# Patient Record
Sex: Male | Born: 1935
Health system: Southern US, Community
[De-identification: ages and names within clinical notes are randomized; demographics above are authoritative.]

## PROBLEM LIST (undated history)

## (undated) DIAGNOSIS — E739 Lactose intolerance, unspecified: Secondary | ICD-10-CM

## (undated) DIAGNOSIS — I359 Nonrheumatic aortic valve disorder, unspecified: Secondary | ICD-10-CM

## (undated) DIAGNOSIS — E785 Hyperlipidemia, unspecified: Secondary | ICD-10-CM

## (undated) DIAGNOSIS — Z9981 Dependence on supplemental oxygen: Secondary | ICD-10-CM

## (undated) DIAGNOSIS — G4730 Sleep apnea, unspecified: Secondary | ICD-10-CM

## (undated) DIAGNOSIS — I495 Sick sinus syndrome: Secondary | ICD-10-CM

## (undated) DIAGNOSIS — K56609 Unspecified intestinal obstruction, unspecified as to partial versus complete obstruction: Secondary | ICD-10-CM

## (undated) DIAGNOSIS — Z9289 Personal history of other medical treatment: Secondary | ICD-10-CM

## (undated) DIAGNOSIS — E119 Type 2 diabetes mellitus without complications: Secondary | ICD-10-CM

## (undated) DIAGNOSIS — K5790 Diverticulosis of intestine, part unspecified, without perforation or abscess without bleeding: Secondary | ICD-10-CM

## (undated) DIAGNOSIS — I739 Peripheral vascular disease, unspecified: Secondary | ICD-10-CM

## (undated) DIAGNOSIS — K219 Gastro-esophageal reflux disease without esophagitis: Secondary | ICD-10-CM

## (undated) DIAGNOSIS — I10 Essential (primary) hypertension: Secondary | ICD-10-CM

## (undated) DIAGNOSIS — H269 Unspecified cataract: Secondary | ICD-10-CM

## (undated) DIAGNOSIS — K56600 Partial intestinal obstruction, unspecified as to cause: Secondary | ICD-10-CM

## (undated) DIAGNOSIS — K589 Irritable bowel syndrome without diarrhea: Secondary | ICD-10-CM

## (undated) DIAGNOSIS — I2510 Atherosclerotic heart disease of native coronary artery without angina pectoris: Secondary | ICD-10-CM

## (undated) DIAGNOSIS — R0989 Other specified symptoms and signs involving the circulatory and respiratory systems: Secondary | ICD-10-CM

## (undated) DIAGNOSIS — C61 Malignant neoplasm of prostate: Secondary | ICD-10-CM

## (undated) DIAGNOSIS — K5730 Diverticulosis of large intestine without perforation or abscess without bleeding: Secondary | ICD-10-CM

## (undated) DIAGNOSIS — E669 Obesity, unspecified: Secondary | ICD-10-CM

## (undated) DIAGNOSIS — J449 Chronic obstructive pulmonary disease, unspecified: Secondary | ICD-10-CM

## (undated) DIAGNOSIS — M1990 Unspecified osteoarthritis, unspecified site: Secondary | ICD-10-CM

## (undated) DIAGNOSIS — I2589 Other forms of chronic ischemic heart disease: Secondary | ICD-10-CM

## (undated) DIAGNOSIS — E210 Primary hyperparathyroidism: Secondary | ICD-10-CM

## (undated) DIAGNOSIS — J4489 Other specified chronic obstructive pulmonary disease: Secondary | ICD-10-CM

## (undated) DIAGNOSIS — K5720 Diverticulitis of large intestine with perforation and abscess without bleeding: Secondary | ICD-10-CM

## (undated) DIAGNOSIS — E871 Hypo-osmolality and hyponatremia: Secondary | ICD-10-CM

## (undated) DIAGNOSIS — K449 Diaphragmatic hernia without obstruction or gangrene: Secondary | ICD-10-CM

## (undated) DIAGNOSIS — C9210 Chronic myeloid leukemia, BCR/ABL-positive, not having achieved remission: Principal | ICD-10-CM

## (undated) DIAGNOSIS — D649 Anemia, unspecified: Secondary | ICD-10-CM

## (undated) DIAGNOSIS — J309 Allergic rhinitis, unspecified: Secondary | ICD-10-CM

## (undated) DIAGNOSIS — I5189 Other ill-defined heart diseases: Secondary | ICD-10-CM

## (undated) HISTORY — DX: Diverticulosis of intestine, part unspecified, without perforation or abscess without bleeding: K57.90

## (undated) HISTORY — DX: Chronic obstructive pulmonary disease, unspecified: J44.9

## (undated) HISTORY — PX: PENILE PROSTHESIS PLACEMENT: SHX739

## (undated) HISTORY — DX: Nonrheumatic aortic valve disorder, unspecified: I35.9

## (undated) HISTORY — DX: Peripheral vascular disease, unspecified: I73.9

## (undated) HISTORY — DX: Partial intestinal obstruction, unspecified as to cause: K56.600

## (undated) HISTORY — PX: BACK SURGERY: SHX140

## (undated) HISTORY — DX: Personal history of other medical treatment: Z92.89

## (undated) HISTORY — DX: Other specified symptoms and signs involving the circulatory and respiratory systems: R09.89

## (undated) HISTORY — DX: Type 2 diabetes mellitus without complications: E11.9

## (undated) HISTORY — DX: Lactose intolerance, unspecified: E73.9

## (undated) HISTORY — DX: Irritable bowel syndrome, unspecified: K58.9

## (undated) HISTORY — DX: Anemia, unspecified: D64.9

## (undated) HISTORY — DX: Diaphragmatic hernia without obstruction or gangrene: K44.9

## (undated) HISTORY — PX: CORONARY ARTERY BYPASS GRAFT: SHX141

## (undated) HISTORY — DX: Unspecified osteoarthritis, unspecified site: M19.90

## (undated) HISTORY — DX: Chronic myeloid leukemia, BCR/ABL-positive, not having achieved remission: C92.10

## (undated) HISTORY — DX: Essential (primary) hypertension: I10

## (undated) HISTORY — PX: PACEMAKER INSERTION: SHX728

## (undated) HISTORY — DX: Hyperlipidemia, unspecified: E78.5

## (undated) HISTORY — DX: Obesity, unspecified: E66.9

## (undated) HISTORY — DX: Gastro-esophageal reflux disease without esophagitis: K21.9

## (undated) HISTORY — DX: Unspecified intestinal obstruction, unspecified as to partial versus complete obstruction: K56.609

## (undated) HISTORY — PX: COLON SURGERY: SHX602

## (undated) HISTORY — DX: Primary hyperparathyroidism: E21.0

## (undated) HISTORY — PX: OTHER SURGICAL HISTORY: SHX169

## (undated) HISTORY — DX: Malignant neoplasm of prostate: C61

## (undated) HISTORY — PX: COLONOSCOPY: SHX174

## (undated) HISTORY — DX: Other forms of chronic ischemic heart disease: I25.89

## (undated) HISTORY — DX: Diverticulosis of large intestine without perforation or abscess without bleeding: K57.30

## (undated) HISTORY — PX: INGUINAL HERNIA REPAIR: SUR1180

## (undated) HISTORY — DX: Unspecified cataract: H26.9

## (undated) HISTORY — DX: Allergic rhinitis, unspecified: J30.9

## (undated) HISTORY — PX: PTCA: SHX146

## (undated) HISTORY — DX: Atherosclerotic heart disease of native coronary artery without angina pectoris: I25.10

## (undated) HISTORY — DX: Other specified chronic obstructive pulmonary disease: J44.89

## (undated) HISTORY — PX: ESOPHAGOGASTRODUODENOSCOPY: SHX1529

## (undated) HISTORY — DX: Sick sinus syndrome: I49.5

---

## 1998-07-24 ENCOUNTER — Encounter: Admission: RE | Admit: 1998-07-24 | Discharge: 1998-09-10 | Payer: Self-pay | Admitting: Anesthesiology

## 1998-08-19 ENCOUNTER — Ambulatory Visit (HOSPITAL_COMMUNITY): Admission: RE | Admit: 1998-08-19 | Discharge: 1998-08-19 | Payer: Self-pay | Admitting: Orthopedic Surgery

## 1998-08-19 ENCOUNTER — Encounter: Payer: Self-pay | Admitting: Orthopedic Surgery

## 1999-12-09 ENCOUNTER — Encounter: Admission: RE | Admit: 1999-12-09 | Discharge: 1999-12-09 | Payer: Self-pay | Admitting: Family Medicine

## 1999-12-09 ENCOUNTER — Encounter: Payer: Self-pay | Admitting: Family Medicine

## 2000-06-15 ENCOUNTER — Inpatient Hospital Stay (HOSPITAL_COMMUNITY): Admission: EM | Admit: 2000-06-15 | Discharge: 2000-06-18 | Payer: Self-pay | Admitting: Cardiology

## 2000-06-15 ENCOUNTER — Encounter: Payer: Self-pay | Admitting: Cardiology

## 2000-06-18 ENCOUNTER — Encounter: Payer: Self-pay | Admitting: Cardiology

## 2000-10-27 ENCOUNTER — Encounter: Admission: RE | Admit: 2000-10-27 | Discharge: 2000-10-27 | Payer: Self-pay | Admitting: Family Medicine

## 2000-10-27 ENCOUNTER — Encounter: Payer: Self-pay | Admitting: Family Medicine

## 2001-07-01 ENCOUNTER — Ambulatory Visit: Admission: RE | Admit: 2001-07-01 | Discharge: 2001-09-29 | Payer: Self-pay | Admitting: Radiation Oncology

## 2001-08-22 ENCOUNTER — Encounter: Payer: Self-pay | Admitting: Urology

## 2001-08-22 ENCOUNTER — Encounter: Admission: RE | Admit: 2001-08-22 | Discharge: 2001-08-22 | Payer: Self-pay | Admitting: Urology

## 2001-09-27 ENCOUNTER — Ambulatory Visit (HOSPITAL_BASED_OUTPATIENT_CLINIC_OR_DEPARTMENT_OTHER): Admission: RE | Admit: 2001-09-27 | Discharge: 2001-09-27 | Payer: Self-pay | Admitting: Urology

## 2001-09-27 ENCOUNTER — Encounter: Payer: Self-pay | Admitting: Urology

## 2001-09-30 ENCOUNTER — Ambulatory Visit: Admission: RE | Admit: 2001-09-30 | Discharge: 2001-12-29 | Payer: Self-pay | Admitting: Radiation Oncology

## 2001-11-24 ENCOUNTER — Encounter: Admission: RE | Admit: 2001-11-24 | Discharge: 2001-11-24 | Payer: Self-pay | Admitting: Family Medicine

## 2001-11-24 ENCOUNTER — Encounter: Payer: Self-pay | Admitting: Family Medicine

## 2001-12-05 ENCOUNTER — Encounter: Payer: Self-pay | Admitting: Family Medicine

## 2001-12-05 ENCOUNTER — Encounter: Admission: RE | Admit: 2001-12-05 | Discharge: 2001-12-05 | Payer: Self-pay | Admitting: Family Medicine

## 2002-02-09 ENCOUNTER — Observation Stay (HOSPITAL_COMMUNITY): Admission: RE | Admit: 2002-02-09 | Discharge: 2002-02-10 | Payer: Self-pay | Admitting: Urology

## 2002-05-23 ENCOUNTER — Encounter: Payer: Self-pay | Admitting: Family Medicine

## 2002-05-23 ENCOUNTER — Encounter: Admission: RE | Admit: 2002-05-23 | Discharge: 2002-05-23 | Payer: Self-pay | Admitting: Family Medicine

## 2003-01-12 ENCOUNTER — Encounter: Payer: Self-pay | Admitting: Emergency Medicine

## 2003-01-12 ENCOUNTER — Inpatient Hospital Stay (HOSPITAL_COMMUNITY): Admission: EM | Admit: 2003-01-12 | Discharge: 2003-01-17 | Payer: Self-pay | Admitting: Emergency Medicine

## 2003-04-16 ENCOUNTER — Inpatient Hospital Stay (HOSPITAL_COMMUNITY): Admission: EM | Admit: 2003-04-16 | Discharge: 2003-04-17 | Payer: Self-pay | Admitting: Emergency Medicine

## 2003-05-29 ENCOUNTER — Ambulatory Visit (HOSPITAL_COMMUNITY): Admission: RE | Admit: 2003-05-29 | Discharge: 2003-05-29 | Payer: Self-pay | Admitting: Gastroenterology

## 2003-07-17 ENCOUNTER — Encounter (INDEPENDENT_AMBULATORY_CARE_PROVIDER_SITE_OTHER): Payer: Self-pay | Admitting: Gastroenterology

## 2003-11-12 ENCOUNTER — Encounter: Admission: RE | Admit: 2003-11-12 | Discharge: 2003-11-12 | Payer: Self-pay | Admitting: Family Medicine

## 2004-09-22 ENCOUNTER — Ambulatory Visit: Payer: Self-pay | Admitting: Gastroenterology

## 2004-10-02 ENCOUNTER — Ambulatory Visit: Payer: Self-pay | Admitting: Gastroenterology

## 2004-10-21 ENCOUNTER — Ambulatory Visit: Payer: Self-pay | Admitting: Gastroenterology

## 2004-11-25 ENCOUNTER — Ambulatory Visit: Payer: Self-pay | Admitting: Cardiology

## 2004-11-25 ENCOUNTER — Ambulatory Visit: Payer: Self-pay | Admitting: Internal Medicine

## 2004-12-01 ENCOUNTER — Ambulatory Visit: Payer: Self-pay | Admitting: Internal Medicine

## 2004-12-10 ENCOUNTER — Ambulatory Visit: Payer: Self-pay | Admitting: Internal Medicine

## 2004-12-17 ENCOUNTER — Ambulatory Visit: Payer: Self-pay | Admitting: Internal Medicine

## 2004-12-25 ENCOUNTER — Ambulatory Visit: Payer: Self-pay | Admitting: Internal Medicine

## 2004-12-29 ENCOUNTER — Ambulatory Visit: Payer: Self-pay | Admitting: Internal Medicine

## 2005-01-13 ENCOUNTER — Ambulatory Visit: Payer: Self-pay | Admitting: Internal Medicine

## 2005-01-19 ENCOUNTER — Ambulatory Visit: Payer: Self-pay | Admitting: Internal Medicine

## 2005-02-05 ENCOUNTER — Ambulatory Visit: Payer: Self-pay | Admitting: Internal Medicine

## 2005-02-12 ENCOUNTER — Ambulatory Visit: Payer: Self-pay | Admitting: Internal Medicine

## 2005-02-16 ENCOUNTER — Observation Stay (HOSPITAL_COMMUNITY): Admission: EM | Admit: 2005-02-16 | Discharge: 2005-02-17 | Payer: Self-pay | Admitting: Emergency Medicine

## 2005-02-18 ENCOUNTER — Ambulatory Visit: Payer: Self-pay | Admitting: Internal Medicine

## 2005-03-05 ENCOUNTER — Encounter: Admission: RE | Admit: 2005-03-05 | Discharge: 2005-03-05 | Payer: Self-pay | Admitting: Interventional Cardiology

## 2005-03-13 ENCOUNTER — Ambulatory Visit (HOSPITAL_COMMUNITY): Admission: RE | Admit: 2005-03-13 | Discharge: 2005-03-14 | Payer: Self-pay | Admitting: Interventional Cardiology

## 2005-03-18 ENCOUNTER — Ambulatory Visit: Payer: Self-pay | Admitting: Gastroenterology

## 2005-03-19 ENCOUNTER — Ambulatory Visit: Payer: Self-pay | Admitting: Cardiology

## 2005-04-09 ENCOUNTER — Ambulatory Visit: Payer: Self-pay | Admitting: Internal Medicine

## 2005-04-17 ENCOUNTER — Ambulatory Visit: Payer: Self-pay | Admitting: Internal Medicine

## 2005-06-11 ENCOUNTER — Ambulatory Visit: Payer: Self-pay | Admitting: Internal Medicine

## 2005-09-16 ENCOUNTER — Ambulatory Visit: Payer: Self-pay | Admitting: Gastroenterology

## 2005-09-16 ENCOUNTER — Ambulatory Visit: Payer: Self-pay | Admitting: Internal Medicine

## 2006-01-08 ENCOUNTER — Emergency Department (HOSPITAL_COMMUNITY): Admission: EM | Admit: 2006-01-08 | Discharge: 2006-01-08 | Payer: Self-pay | Admitting: Emergency Medicine

## 2006-07-21 ENCOUNTER — Ambulatory Visit: Payer: Self-pay | Admitting: Internal Medicine

## 2006-07-21 LAB — CONVERTED CEMR LAB
Albumin: 3.8 g/dL (ref 3.5–5.2)
Basophils Absolute: 0 10*3/uL (ref 0.0–0.1)
Chloride: 106 meq/L (ref 96–112)
Eosinophils Absolute: 0.2 10*3/uL (ref 0.0–0.6)
GFR calc Af Amer: 93 mL/min
GFR calc non Af Amer: 77 mL/min
HCT: 37.8 % — ABNORMAL LOW (ref 39.0–52.0)
MCHC: 35.4 g/dL (ref 30.0–36.0)
MCV: 97.6 fL (ref 78.0–100.0)
Monocytes Absolute: 0.7 10*3/uL (ref 0.2–0.7)
Neutro Abs: 3.3 10*3/uL (ref 1.4–7.7)
Neutrophils Relative %: 71.2 % (ref 43.0–77.0)
Potassium: 4.1 meq/L (ref 3.5–5.1)
RBC: 3.88 M/uL — ABNORMAL LOW (ref 4.22–5.81)
Sed Rate: 11 mm/hr (ref 0–20)
Sodium: 142 meq/L (ref 135–145)

## 2006-07-22 ENCOUNTER — Ambulatory Visit: Payer: Self-pay | Admitting: Internal Medicine

## 2006-08-02 ENCOUNTER — Ambulatory Visit: Payer: Self-pay | Admitting: Internal Medicine

## 2006-08-02 LAB — CONVERTED CEMR LAB
CO2: 30 meq/L (ref 19–32)
Chloride: 108 meq/L (ref 96–112)
Creatinine, Ser: 0.8 mg/dL (ref 0.4–1.5)
Creatinine,U: 73.1 mg/dL
Hgb A1c MFr Bld: 6.7 % — ABNORMAL HIGH (ref 4.6–6.0)
Microalb, Ur: 0.6 mg/dL (ref 0.0–1.9)
Potassium: 4.8 meq/L (ref 3.5–5.1)
Sodium: 143 meq/L (ref 135–145)

## 2006-08-04 ENCOUNTER — Ambulatory Visit: Payer: Self-pay | Admitting: Internal Medicine

## 2006-08-04 LAB — CONVERTED CEMR LAB
AST: 42 units/L — ABNORMAL HIGH (ref 0–37)
Albumin: 3.5 g/dL (ref 3.5–5.2)
Alkaline Phosphatase: 46 units/L (ref 39–117)

## 2006-08-31 ENCOUNTER — Ambulatory Visit: Payer: Self-pay | Admitting: Internal Medicine

## 2006-09-09 ENCOUNTER — Ambulatory Visit: Payer: Self-pay | Admitting: Internal Medicine

## 2006-09-09 LAB — CONVERTED CEMR LAB
AST: 29 units/L (ref 0–37)
Albumin: 3.8 g/dL (ref 3.5–5.2)
Alkaline Phosphatase: 50 units/L (ref 39–117)
Total Bilirubin: 0.7 mg/dL (ref 0.3–1.2)
Total Protein: 6.4 g/dL (ref 6.0–8.3)

## 2006-09-10 ENCOUNTER — Ambulatory Visit: Payer: Self-pay | Admitting: Internal Medicine

## 2006-09-10 LAB — CONVERTED CEMR LAB
Bilirubin Urine: NEGATIVE
Crystals: NEGATIVE
Hemoglobin, Urine: NEGATIVE
Leukocytes, UA: NEGATIVE
Nitrite: NEGATIVE
PSA: 0.02 ng/mL — ABNORMAL LOW (ref 0.10–4.00)
Total Protein, Urine: 30 mg/dL — AB
Urine Glucose: NEGATIVE mg/dL
Urobilinogen, UA: 0.2 (ref 0.0–1.0)

## 2006-09-11 ENCOUNTER — Inpatient Hospital Stay (HOSPITAL_COMMUNITY): Admission: EM | Admit: 2006-09-11 | Discharge: 2006-09-15 | Payer: Self-pay | Admitting: Emergency Medicine

## 2006-09-13 ENCOUNTER — Ambulatory Visit: Payer: Self-pay | Admitting: Internal Medicine

## 2006-09-16 ENCOUNTER — Ambulatory Visit: Payer: Self-pay | Admitting: Internal Medicine

## 2006-09-18 ENCOUNTER — Emergency Department (HOSPITAL_COMMUNITY): Admission: EM | Admit: 2006-09-18 | Discharge: 2006-09-18 | Payer: Self-pay | Admitting: Emergency Medicine

## 2006-09-21 ENCOUNTER — Ambulatory Visit: Payer: Self-pay | Admitting: Internal Medicine

## 2006-10-05 ENCOUNTER — Ambulatory Visit (HOSPITAL_COMMUNITY): Admission: RE | Admit: 2006-10-05 | Discharge: 2006-10-05 | Payer: Self-pay | Admitting: Internal Medicine

## 2006-10-11 ENCOUNTER — Ambulatory Visit (HOSPITAL_COMMUNITY): Admission: RE | Admit: 2006-10-11 | Discharge: 2006-10-11 | Payer: Self-pay | Admitting: Internal Medicine

## 2006-10-11 ENCOUNTER — Ambulatory Visit: Payer: Self-pay | Admitting: Internal Medicine

## 2006-11-04 ENCOUNTER — Ambulatory Visit: Payer: Self-pay | Admitting: Internal Medicine

## 2006-12-21 ENCOUNTER — Ambulatory Visit: Payer: Self-pay | Admitting: Internal Medicine

## 2006-12-21 LAB — CONVERTED CEMR LAB
CO2: 27 meq/L (ref 19–32)
Chloride: 107 meq/L (ref 96–112)
Creatinine, Ser: 1.2 mg/dL (ref 0.4–1.5)
Creatinine,U: 259 mg/dL
Glucose, Bld: 139 mg/dL — ABNORMAL HIGH (ref 70–99)
Hgb A1c MFr Bld: 6.5 % — ABNORMAL HIGH (ref 4.6–6.0)
Sodium: 142 meq/L (ref 135–145)

## 2006-12-27 DIAGNOSIS — J449 Chronic obstructive pulmonary disease, unspecified: Secondary | ICD-10-CM

## 2006-12-27 DIAGNOSIS — E669 Obesity, unspecified: Secondary | ICD-10-CM

## 2006-12-27 DIAGNOSIS — E739 Lactose intolerance, unspecified: Secondary | ICD-10-CM | POA: Insufficient documentation

## 2006-12-27 DIAGNOSIS — I739 Peripheral vascular disease, unspecified: Secondary | ICD-10-CM

## 2007-03-02 ENCOUNTER — Emergency Department (HOSPITAL_COMMUNITY): Admission: EM | Admit: 2007-03-02 | Discharge: 2007-03-02 | Payer: Self-pay | Admitting: Emergency Medicine

## 2007-03-04 ENCOUNTER — Ambulatory Visit: Payer: Self-pay | Admitting: Internal Medicine

## 2007-03-22 ENCOUNTER — Ambulatory Visit: Payer: Self-pay | Admitting: Internal Medicine

## 2007-03-24 ENCOUNTER — Ambulatory Visit: Payer: Self-pay | Admitting: Internal Medicine

## 2007-05-11 ENCOUNTER — Encounter: Payer: Self-pay | Admitting: Internal Medicine

## 2007-06-09 HISTORY — PX: OTHER SURGICAL HISTORY: SHX169

## 2007-06-28 ENCOUNTER — Ambulatory Visit: Payer: Self-pay | Admitting: Internal Medicine

## 2007-07-04 ENCOUNTER — Inpatient Hospital Stay (HOSPITAL_COMMUNITY): Admission: EM | Admit: 2007-07-04 | Discharge: 2007-07-07 | Payer: Self-pay | Admitting: Emergency Medicine

## 2007-07-18 ENCOUNTER — Encounter: Payer: Self-pay | Admitting: Internal Medicine

## 2007-08-12 DIAGNOSIS — I359 Nonrheumatic aortic valve disorder, unspecified: Secondary | ICD-10-CM | POA: Insufficient documentation

## 2007-09-01 ENCOUNTER — Encounter: Payer: Self-pay | Admitting: Internal Medicine

## 2007-09-07 DIAGNOSIS — I495 Sick sinus syndrome: Secondary | ICD-10-CM

## 2007-09-07 HISTORY — DX: Sick sinus syndrome: I49.5

## 2007-09-12 ENCOUNTER — Inpatient Hospital Stay (HOSPITAL_COMMUNITY): Admission: AD | Admit: 2007-09-12 | Discharge: 2007-09-21 | Payer: Self-pay | Admitting: Interventional Cardiology

## 2007-09-12 ENCOUNTER — Ambulatory Visit: Payer: Self-pay | Admitting: Internal Medicine

## 2007-10-03 ENCOUNTER — Ambulatory Visit: Payer: Self-pay

## 2007-10-06 ENCOUNTER — Encounter (HOSPITAL_COMMUNITY): Admission: RE | Admit: 2007-10-06 | Discharge: 2007-12-07 | Payer: Self-pay | Admitting: Interventional Cardiology

## 2007-10-11 ENCOUNTER — Ambulatory Visit: Payer: Self-pay | Admitting: Internal Medicine

## 2007-10-27 ENCOUNTER — Ambulatory Visit: Payer: Self-pay | Admitting: Internal Medicine

## 2007-10-27 DIAGNOSIS — J309 Allergic rhinitis, unspecified: Secondary | ICD-10-CM | POA: Insufficient documentation

## 2007-10-27 LAB — CONVERTED CEMR LAB
Eosinophils Relative: 4.4 % (ref 0.0–5.0)
HCT: 36 % — ABNORMAL LOW (ref 39.0–52.0)
Monocytes Absolute: 0.4 10*3/uL (ref 0.1–1.0)
Monocytes Relative: 13.8 % — ABNORMAL HIGH (ref 3.0–12.0)
Platelets: 180 10*3/uL (ref 150–400)
RBC: 3.72 M/uL — ABNORMAL LOW (ref 4.22–5.81)
WBC: 3.2 10*3/uL — ABNORMAL LOW (ref 4.5–10.5)

## 2007-11-07 ENCOUNTER — Encounter: Payer: Self-pay | Admitting: Internal Medicine

## 2007-11-09 ENCOUNTER — Encounter: Payer: Self-pay | Admitting: Internal Medicine

## 2007-12-20 ENCOUNTER — Encounter: Payer: Self-pay | Admitting: Internal Medicine

## 2007-12-26 ENCOUNTER — Encounter: Payer: Self-pay | Admitting: Internal Medicine

## 2008-02-14 ENCOUNTER — Ambulatory Visit: Payer: Self-pay | Admitting: Internal Medicine

## 2008-02-20 ENCOUNTER — Ambulatory Visit: Payer: Self-pay | Admitting: Internal Medicine

## 2008-02-20 LAB — CONVERTED CEMR LAB
CO2: 30 meq/L (ref 19–32)
Calcium: 9.4 mg/dL (ref 8.4–10.5)
Glucose, Bld: 112 mg/dL — ABNORMAL HIGH (ref 70–99)
Microalb Creat Ratio: 6.6 mg/g (ref 0.0–30.0)
Microalb, Ur: 0.8 mg/dL (ref 0.0–1.9)
Potassium: 4.7 meq/L (ref 3.5–5.1)
Sodium: 141 meq/L (ref 135–145)

## 2008-02-27 ENCOUNTER — Ambulatory Visit: Payer: Self-pay | Admitting: Internal Medicine

## 2008-02-27 DIAGNOSIS — R0989 Other specified symptoms and signs involving the circulatory and respiratory systems: Secondary | ICD-10-CM

## 2008-02-27 HISTORY — DX: Other specified symptoms and signs involving the circulatory and respiratory systems: R09.89

## 2008-03-01 ENCOUNTER — Ambulatory Visit: Payer: Self-pay

## 2008-03-01 ENCOUNTER — Encounter: Payer: Self-pay | Admitting: Internal Medicine

## 2008-03-02 ENCOUNTER — Telehealth: Payer: Self-pay | Admitting: Internal Medicine

## 2008-03-08 ENCOUNTER — Encounter: Payer: Self-pay | Admitting: Internal Medicine

## 2008-03-15 ENCOUNTER — Telehealth: Payer: Self-pay | Admitting: Internal Medicine

## 2008-03-15 ENCOUNTER — Encounter (INDEPENDENT_AMBULATORY_CARE_PROVIDER_SITE_OTHER): Payer: Self-pay | Admitting: *Deleted

## 2008-03-15 ENCOUNTER — Encounter: Payer: Self-pay | Admitting: Internal Medicine

## 2008-03-16 ENCOUNTER — Ambulatory Visit: Payer: Self-pay | Admitting: Internal Medicine

## 2008-03-16 ENCOUNTER — Encounter: Payer: Self-pay | Admitting: Internal Medicine

## 2008-03-16 ENCOUNTER — Inpatient Hospital Stay (HOSPITAL_COMMUNITY): Admission: EM | Admit: 2008-03-16 | Discharge: 2008-03-17 | Payer: Self-pay | Admitting: Emergency Medicine

## 2008-03-16 ENCOUNTER — Encounter (INDEPENDENT_AMBULATORY_CARE_PROVIDER_SITE_OTHER): Payer: Self-pay | Admitting: *Deleted

## 2008-03-17 ENCOUNTER — Encounter (INDEPENDENT_AMBULATORY_CARE_PROVIDER_SITE_OTHER): Payer: Self-pay | Admitting: *Deleted

## 2008-03-19 ENCOUNTER — Telehealth: Payer: Self-pay | Admitting: Internal Medicine

## 2008-03-20 ENCOUNTER — Ambulatory Visit: Payer: Self-pay | Admitting: Gastroenterology

## 2008-03-20 ENCOUNTER — Encounter (INDEPENDENT_AMBULATORY_CARE_PROVIDER_SITE_OTHER): Payer: Self-pay | Admitting: *Deleted

## 2008-03-20 ENCOUNTER — Encounter: Payer: Self-pay | Admitting: Internal Medicine

## 2008-03-20 LAB — CONVERTED CEMR LAB
ALT: 18 units/L (ref 0–53)
BUN: 12 mg/dL (ref 6–23)
Basophils Relative: 0.9 % (ref 0.0–3.0)
CO2: 31 meq/L (ref 19–32)
Calcium: 9.5 mg/dL (ref 8.4–10.5)
Chloride: 104 meq/L (ref 96–112)
Creatinine, Ser: 1.1 mg/dL (ref 0.4–1.5)
Eosinophils Relative: 7.4 % — ABNORMAL HIGH (ref 0.0–5.0)
GFR calc Af Amer: 83 mL/min
Lymphocytes Relative: 15 % (ref 12.0–46.0)
Neutrophils Relative %: 65.3 % (ref 43.0–77.0)
RBC: 3.45 M/uL — ABNORMAL LOW (ref 4.22–5.81)
WBC: 4.2 10*3/uL — ABNORMAL LOW (ref 4.5–10.5)

## 2008-03-22 ENCOUNTER — Telehealth: Payer: Self-pay | Admitting: Physician Assistant

## 2008-03-28 ENCOUNTER — Telehealth (INDEPENDENT_AMBULATORY_CARE_PROVIDER_SITE_OTHER): Payer: Self-pay | Admitting: *Deleted

## 2008-04-04 ENCOUNTER — Ambulatory Visit: Payer: Self-pay | Admitting: Internal Medicine

## 2008-05-02 ENCOUNTER — Telehealth: Payer: Self-pay | Admitting: Internal Medicine

## 2008-05-11 ENCOUNTER — Telehealth: Payer: Self-pay | Admitting: Internal Medicine

## 2008-05-16 ENCOUNTER — Telehealth: Payer: Self-pay | Admitting: Internal Medicine

## 2008-05-17 ENCOUNTER — Ambulatory Visit: Payer: Self-pay | Admitting: Internal Medicine

## 2008-05-17 DIAGNOSIS — Z860100 Personal history of colon polyps, unspecified: Secondary | ICD-10-CM | POA: Insufficient documentation

## 2008-05-18 LAB — CONVERTED CEMR LAB
Bacteria, UA: NEGATIVE
Basophils Absolute: 0 10*3/uL (ref 0.0–0.1)
Basophils Relative: 0.9 % (ref 0.0–3.0)
Chloride: 103 meq/L (ref 96–112)
GFR calc Af Amer: 93 mL/min
GFR calc non Af Amer: 77 mL/min
Leukocytes, UA: NEGATIVE
Lymphocytes Relative: 12.9 % (ref 12.0–46.0)
MCHC: 34.9 g/dL (ref 30.0–36.0)
Neutrophils Relative %: 66.7 % (ref 43.0–77.0)
Nitrite: NEGATIVE
Platelets: 166 10*3/uL (ref 150–400)
Potassium: 4.3 meq/L (ref 3.5–5.1)
RBC / HPF: NONE SEEN
RBC: 3.85 M/uL — ABNORMAL LOW (ref 4.22–5.81)
Sodium: 138 meq/L (ref 135–145)
Specific Gravity, Urine: 1.025 (ref 1.000–1.03)
WBC: 5.1 10*3/uL (ref 4.5–10.5)
pH: 5.5 (ref 5.0–8.0)

## 2008-05-21 ENCOUNTER — Telehealth: Payer: Self-pay | Admitting: Internal Medicine

## 2008-06-22 ENCOUNTER — Telehealth: Payer: Self-pay | Admitting: Internal Medicine

## 2008-06-26 ENCOUNTER — Ambulatory Visit: Payer: Self-pay | Admitting: Internal Medicine

## 2008-06-26 LAB — CONVERTED CEMR LAB
CO2: 29 meq/L (ref 19–32)
Chloride: 109 meq/L (ref 96–112)
GFR calc non Af Amer: 87 mL/min
Hgb A1c MFr Bld: 6.3 % — ABNORMAL HIGH (ref 4.6–6.0)
Potassium: 4.3 meq/L (ref 3.5–5.1)
Sodium: 140 meq/L (ref 135–145)

## 2008-07-03 ENCOUNTER — Ambulatory Visit: Payer: Self-pay | Admitting: Internal Medicine

## 2008-07-17 ENCOUNTER — Telehealth: Payer: Self-pay | Admitting: Internal Medicine

## 2008-08-06 ENCOUNTER — Telehealth (INDEPENDENT_AMBULATORY_CARE_PROVIDER_SITE_OTHER): Payer: Self-pay | Admitting: *Deleted

## 2008-08-07 ENCOUNTER — Ambulatory Visit: Payer: Self-pay | Admitting: Internal Medicine

## 2008-08-29 ENCOUNTER — Encounter: Admission: RE | Admit: 2008-08-29 | Discharge: 2008-08-29 | Payer: Self-pay | Admitting: Specialist

## 2008-09-12 ENCOUNTER — Telehealth: Payer: Self-pay | Admitting: Internal Medicine

## 2008-09-13 ENCOUNTER — Ambulatory Visit: Payer: Self-pay | Admitting: Gastroenterology

## 2008-09-14 ENCOUNTER — Encounter (INDEPENDENT_AMBULATORY_CARE_PROVIDER_SITE_OTHER): Payer: Self-pay | Admitting: Radiology

## 2008-09-17 ENCOUNTER — Telehealth: Payer: Self-pay | Admitting: Internal Medicine

## 2008-09-24 ENCOUNTER — Encounter: Admission: RE | Admit: 2008-09-24 | Discharge: 2008-09-24 | Payer: Self-pay | Admitting: Specialist

## 2008-10-05 ENCOUNTER — Encounter: Payer: Self-pay | Admitting: Internal Medicine

## 2008-10-05 LAB — CONVERTED CEMR LAB
ALT: 29 units/L
AST: 25 units/L
Chloride: 105 meq/L
Cholesterol: 114 mg/dL
HCT: 39.9 %
HDL: 30 mg/dL
Hemoglobin: 13 g/dL
LDL Cholesterol: 73 mg/dL
Platelets: 154 10*3/uL
Potassium: 4.4 meq/L
RBC: 3.99 M/uL
WBC: 6.29 10*3/uL

## 2008-10-09 ENCOUNTER — Ambulatory Visit: Payer: Self-pay | Admitting: Internal Medicine

## 2008-10-09 DIAGNOSIS — I495 Sick sinus syndrome: Secondary | ICD-10-CM

## 2008-10-09 DIAGNOSIS — I2589 Other forms of chronic ischemic heart disease: Secondary | ICD-10-CM | POA: Insufficient documentation

## 2008-10-15 ENCOUNTER — Encounter: Admission: RE | Admit: 2008-10-15 | Discharge: 2008-10-15 | Payer: Self-pay | Admitting: Specialist

## 2008-11-08 ENCOUNTER — Ambulatory Visit: Payer: Self-pay | Admitting: Internal Medicine

## 2008-11-08 DIAGNOSIS — M545 Low back pain: Secondary | ICD-10-CM

## 2008-11-08 LAB — CONVERTED CEMR LAB
BUN: 22 mg/dL (ref 6–23)
CO2: 29 meq/L (ref 19–32)
Calcium: 9.6 mg/dL (ref 8.4–10.5)
Creatinine, Ser: 1.1 mg/dL (ref 0.4–1.5)
Creatinine,U: 232.9 mg/dL
Hgb A1c MFr Bld: 6.3 % (ref 4.6–6.5)
Microalb Creat Ratio: 4.3 mg/g (ref 0.0–30.0)

## 2008-11-12 ENCOUNTER — Telehealth: Payer: Self-pay | Admitting: Internal Medicine

## 2008-11-20 ENCOUNTER — Encounter: Admission: RE | Admit: 2008-11-20 | Discharge: 2008-11-20 | Payer: Self-pay | Admitting: Specialist

## 2008-12-06 HISTORY — PX: LUMBAR DISC SURGERY: SHX700

## 2008-12-13 ENCOUNTER — Inpatient Hospital Stay (HOSPITAL_COMMUNITY): Admission: RE | Admit: 2008-12-13 | Discharge: 2008-12-16 | Payer: Self-pay | Admitting: Specialist

## 2008-12-13 ENCOUNTER — Ambulatory Visit: Payer: Self-pay | Admitting: Internal Medicine

## 2009-01-17 ENCOUNTER — Telehealth: Payer: Self-pay | Admitting: Internal Medicine

## 2009-01-18 ENCOUNTER — Ambulatory Visit: Payer: Self-pay | Admitting: Internal Medicine

## 2009-01-18 ENCOUNTER — Ambulatory Visit: Payer: Self-pay | Admitting: Gastroenterology

## 2009-01-18 DIAGNOSIS — Z8546 Personal history of malignant neoplasm of prostate: Secondary | ICD-10-CM | POA: Insufficient documentation

## 2009-01-18 DIAGNOSIS — K219 Gastro-esophageal reflux disease without esophagitis: Secondary | ICD-10-CM

## 2009-01-18 DIAGNOSIS — K5730 Diverticulosis of large intestine without perforation or abscess without bleeding: Secondary | ICD-10-CM

## 2009-01-18 DIAGNOSIS — E785 Hyperlipidemia, unspecified: Secondary | ICD-10-CM

## 2009-01-18 DIAGNOSIS — E119 Type 2 diabetes mellitus without complications: Secondary | ICD-10-CM | POA: Insufficient documentation

## 2009-01-18 DIAGNOSIS — I2510 Atherosclerotic heart disease of native coronary artery without angina pectoris: Secondary | ICD-10-CM | POA: Insufficient documentation

## 2009-01-18 DIAGNOSIS — I110 Hypertensive heart disease with heart failure: Secondary | ICD-10-CM

## 2009-01-18 HISTORY — DX: Diverticulosis of large intestine without perforation or abscess without bleeding: K57.30

## 2009-01-18 LAB — CONVERTED CEMR LAB
Basophils Absolute: 0 10*3/uL (ref 0.0–0.1)
Calcium: 10.1 mg/dL (ref 8.4–10.5)
Eosinophils Absolute: 0.2 10*3/uL (ref 0.0–0.7)
Eosinophils Relative: 4.9 % (ref 0.0–5.0)
GFR calc non Af Amer: 86.34 mL/min (ref >60)
Glucose, Bld: 104 mg/dL — ABNORMAL HIGH (ref 70–99)
HCT: 35.5 % — ABNORMAL LOW (ref 39.0–52.0)
Lymphs Abs: 0.5 10*3/uL — ABNORMAL LOW (ref 0.7–4.0)
MCHC: 34.1 g/dL (ref 30.0–36.0)
MCV: 99.7 fL (ref 78.0–100.0)
Monocytes Absolute: 0.4 10*3/uL (ref 0.1–1.0)
Neutrophils Relative %: 74.2 % (ref 43.0–77.0)
Platelets: 135 10*3/uL — ABNORMAL LOW (ref 150.0–400.0)
Potassium: 4.3 meq/L (ref 3.5–5.1)
RDW: 12.3 % (ref 11.5–14.6)
Sodium: 144 meq/L (ref 135–145)

## 2009-01-22 ENCOUNTER — Ambulatory Visit: Payer: Self-pay | Admitting: Internal Medicine

## 2009-01-25 ENCOUNTER — Telehealth: Payer: Self-pay | Admitting: Internal Medicine

## 2009-01-28 ENCOUNTER — Telehealth: Payer: Self-pay | Admitting: Physician Assistant

## 2009-02-28 ENCOUNTER — Encounter: Payer: Self-pay | Admitting: Internal Medicine

## 2009-03-06 ENCOUNTER — Encounter: Payer: Self-pay | Admitting: Internal Medicine

## 2009-03-11 ENCOUNTER — Ambulatory Visit: Payer: Self-pay | Admitting: Internal Medicine

## 2009-03-25 ENCOUNTER — Ambulatory Visit (HOSPITAL_COMMUNITY): Admission: RE | Admit: 2009-03-25 | Discharge: 2009-03-25 | Payer: Self-pay | Admitting: Interventional Cardiology

## 2009-04-09 ENCOUNTER — Encounter: Payer: Self-pay | Admitting: Cardiovascular Disease

## 2009-04-10 ENCOUNTER — Encounter: Payer: Self-pay | Admitting: Internal Medicine

## 2009-04-10 ENCOUNTER — Ambulatory Visit: Payer: Self-pay

## 2009-04-11 ENCOUNTER — Ambulatory Visit: Payer: Self-pay

## 2009-04-15 ENCOUNTER — Telehealth: Payer: Self-pay | Admitting: Internal Medicine

## 2009-04-18 ENCOUNTER — Ambulatory Visit: Payer: Self-pay | Admitting: Internal Medicine

## 2009-04-18 DIAGNOSIS — K56609 Unspecified intestinal obstruction, unspecified as to partial versus complete obstruction: Secondary | ICD-10-CM

## 2009-04-18 DIAGNOSIS — Z87891 Personal history of nicotine dependence: Secondary | ICD-10-CM

## 2009-04-18 HISTORY — DX: Unspecified intestinal obstruction, unspecified as to partial versus complete obstruction: K56.609

## 2009-04-18 LAB — CONVERTED CEMR LAB
CO2: 25 meq/L (ref 19–32)
Calcium: 9.4 mg/dL (ref 8.4–10.5)
Creatinine,U: 131.9 mg/dL
GFR calc non Af Amer: 68.45 mL/min (ref >60)
HDL: 36.7 mg/dL — ABNORMAL LOW (ref >39.00)
Hgb A1c MFr Bld: 6.6 % — ABNORMAL HIGH (ref 4.6–6.5)
Potassium: 4.1 meq/L (ref 3.5–5.1)
Sodium: 141 meq/L (ref 135–145)
Total CHOL/HDL Ratio: 4

## 2009-06-12 ENCOUNTER — Telehealth: Payer: Self-pay | Admitting: Internal Medicine

## 2009-06-12 ENCOUNTER — Ambulatory Visit: Payer: Self-pay | Admitting: Internal Medicine

## 2009-06-12 DIAGNOSIS — L600 Ingrowing nail: Secondary | ICD-10-CM

## 2009-07-01 ENCOUNTER — Encounter: Payer: Self-pay | Admitting: Internal Medicine

## 2009-07-01 LAB — CONVERTED CEMR LAB
ALT: 29 units/L
AST: 20 units/L
CO2: 28 meq/L
Chloride: 104 meq/L
HDL: 30 mg/dL
LDL Cholesterol: 19 mg/dL
Potassium: 4.2 meq/L
Total Protein: 7.8 g/dL

## 2009-07-18 ENCOUNTER — Ambulatory Visit: Payer: Self-pay | Admitting: Internal Medicine

## 2009-08-08 ENCOUNTER — Encounter: Payer: Self-pay | Admitting: Internal Medicine

## 2009-09-03 ENCOUNTER — Ambulatory Visit: Payer: Self-pay | Admitting: Internal Medicine

## 2009-09-16 ENCOUNTER — Telehealth: Payer: Self-pay | Admitting: Internal Medicine

## 2009-09-17 ENCOUNTER — Ambulatory Visit: Payer: Self-pay | Admitting: Gastroenterology

## 2009-09-19 LAB — CONVERTED CEMR LAB
BUN: 13 mg/dL (ref 6–23)
Eosinophils Relative: 4.3 % (ref 0.0–5.0)
GFR calc non Af Amer: 86.2 mL/min (ref >60)
HCT: 37.7 % — ABNORMAL LOW (ref 39.0–52.0)
Lymphs Abs: 0.5 10*3/uL — ABNORMAL LOW (ref 0.7–4.0)
Monocytes Relative: 11.7 % (ref 3.0–12.0)
Platelets: 170 10*3/uL (ref 150.0–400.0)
Potassium: 5.1 meq/L (ref 3.5–5.1)
Sodium: 142 meq/L (ref 135–145)
WBC: 4.3 10*3/uL — ABNORMAL LOW (ref 4.5–10.5)

## 2009-09-26 ENCOUNTER — Telehealth: Payer: Self-pay | Admitting: Internal Medicine

## 2009-10-23 ENCOUNTER — Ambulatory Visit: Payer: Self-pay | Admitting: Internal Medicine

## 2009-10-25 DIAGNOSIS — K589 Irritable bowel syndrome without diarrhea: Secondary | ICD-10-CM

## 2009-10-29 ENCOUNTER — Telehealth: Payer: Self-pay | Admitting: Internal Medicine

## 2009-11-21 ENCOUNTER — Ambulatory Visit: Payer: Self-pay | Admitting: Internal Medicine

## 2009-11-21 DIAGNOSIS — R269 Unspecified abnormalities of gait and mobility: Secondary | ICD-10-CM | POA: Insufficient documentation

## 2009-12-18 ENCOUNTER — Encounter: Payer: Self-pay | Admitting: Internal Medicine

## 2009-12-30 ENCOUNTER — Ambulatory Visit: Payer: Self-pay | Admitting: Internal Medicine

## 2009-12-30 DIAGNOSIS — R42 Dizziness and giddiness: Secondary | ICD-10-CM | POA: Insufficient documentation

## 2010-01-06 ENCOUNTER — Encounter: Payer: Self-pay | Admitting: Internal Medicine

## 2010-01-06 ENCOUNTER — Encounter (INDEPENDENT_AMBULATORY_CARE_PROVIDER_SITE_OTHER): Payer: Self-pay | Admitting: *Deleted

## 2010-01-06 LAB — CONVERTED CEMR LAB
BUN: 14 mg/dL
Basophils Relative: 0.2 %
Creatinine, Ser: 1.1 mg/dL
Eosinophils Relative: 0.5 %
Glucose, Bld: 113 mg/dL
HCT: 41.2 %
HDL: 41 mg/dL
Hgb A1c MFr Bld: 7.2 %
LDL Cholesterol: 48 mg/dL
RBC: 4.28 M/uL
WBC: 4.37 10*3/uL

## 2010-01-09 ENCOUNTER — Encounter: Payer: Self-pay | Admitting: Internal Medicine

## 2010-01-29 ENCOUNTER — Telehealth: Payer: Self-pay | Admitting: Internal Medicine

## 2010-01-30 ENCOUNTER — Ambulatory Visit: Payer: Self-pay | Admitting: Internal Medicine

## 2010-01-30 ENCOUNTER — Telehealth: Payer: Self-pay | Admitting: Internal Medicine

## 2010-01-30 LAB — CONVERTED CEMR LAB
Basophils Relative: 0.3 % (ref 0.0–3.0)
Eosinophils Absolute: 0.1 10*3/uL (ref 0.0–0.7)
Eosinophils Relative: 1.3 % (ref 0.0–5.0)
Lymphocytes Relative: 10.4 % — ABNORMAL LOW (ref 12.0–46.0)
Neutrophils Relative %: 77.2 % — ABNORMAL HIGH (ref 43.0–77.0)
RBC: 3.67 M/uL — ABNORMAL LOW (ref 4.22–5.81)
WBC: 5.1 10*3/uL (ref 4.5–10.5)

## 2010-02-12 ENCOUNTER — Telehealth: Payer: Self-pay | Admitting: Internal Medicine

## 2010-02-13 ENCOUNTER — Ambulatory Visit: Payer: Self-pay | Admitting: Internal Medicine

## 2010-02-19 ENCOUNTER — Encounter: Payer: Self-pay | Admitting: Internal Medicine

## 2010-02-21 ENCOUNTER — Ambulatory Visit: Payer: Self-pay | Admitting: Internal Medicine

## 2010-03-07 ENCOUNTER — Encounter (INDEPENDENT_AMBULATORY_CARE_PROVIDER_SITE_OTHER): Payer: Self-pay | Admitting: *Deleted

## 2010-03-19 ENCOUNTER — Telehealth: Payer: Self-pay | Admitting: Internal Medicine

## 2010-03-26 ENCOUNTER — Ambulatory Visit: Payer: Self-pay | Admitting: Internal Medicine

## 2010-04-17 ENCOUNTER — Encounter: Payer: Self-pay | Admitting: Internal Medicine

## 2010-04-17 ENCOUNTER — Ambulatory Visit: Payer: Self-pay | Admitting: Internal Medicine

## 2010-04-28 ENCOUNTER — Ambulatory Visit: Payer: Self-pay | Admitting: Internal Medicine

## 2010-04-28 DIAGNOSIS — S2239XA Fracture of one rib, unspecified side, initial encounter for closed fracture: Secondary | ICD-10-CM | POA: Insufficient documentation

## 2010-04-28 DIAGNOSIS — R5381 Other malaise: Secondary | ICD-10-CM

## 2010-04-28 DIAGNOSIS — D649 Anemia, unspecified: Secondary | ICD-10-CM

## 2010-04-28 DIAGNOSIS — R5383 Other fatigue: Secondary | ICD-10-CM

## 2010-04-28 DIAGNOSIS — M25519 Pain in unspecified shoulder: Secondary | ICD-10-CM

## 2010-04-28 DIAGNOSIS — M542 Cervicalgia: Secondary | ICD-10-CM

## 2010-04-28 DIAGNOSIS — R209 Unspecified disturbances of skin sensation: Secondary | ICD-10-CM | POA: Insufficient documentation

## 2010-04-28 DIAGNOSIS — D696 Thrombocytopenia, unspecified: Secondary | ICD-10-CM

## 2010-04-28 LAB — CONVERTED CEMR LAB
ALT: 17 units/L (ref 0–53)
AST: 24 units/L (ref 0–37)
Albumin: 4.3 g/dL (ref 3.5–5.2)
Alkaline Phosphatase: 69 units/L (ref 39–117)
Basophils Absolute: 0 10*3/uL (ref 0.0–0.1)
Basophils Relative: 0.2 % (ref 0.0–3.0)
CO2: 28 meq/L (ref 19–32)
Folate: 14 ng/mL
GFR calc non Af Amer: 61.16 mL/min (ref >60)
Glucose, Bld: 141 mg/dL — ABNORMAL HIGH (ref 70–99)
HCT: 40.8 % (ref 39.0–52.0)
HDL: 39.5 mg/dL (ref >39.00)
Hemoglobin: 14 g/dL (ref 13.0–17.0)
Hgb A1c MFr Bld: 6.5 % (ref 4.6–6.5)
Leukocytes, UA: NEGATIVE
Lymphs Abs: 0.4 10*3/uL — ABNORMAL LOW (ref 0.7–4.0)
Monocytes Relative: 9.3 % (ref 3.0–12.0)
Neutro Abs: 3.8 10*3/uL (ref 1.4–7.7)
Nitrite: NEGATIVE
Potassium: 4.7 meq/L (ref 3.5–5.1)
RBC: 4.16 M/uL — ABNORMAL LOW (ref 4.22–5.81)
RDW: 13.7 % (ref 11.5–14.6)
Sed Rate: 17 mm/hr (ref 0–22)
Sodium: 137 meq/L (ref 135–145)
Specific Gravity, Urine: 1.025 (ref 1.000–1.030)
TSH: 1.19 microintl units/mL (ref 0.35–5.50)
Total CHOL/HDL Ratio: 4
Total Protein: 7 g/dL (ref 6.0–8.3)
Transferrin: 306.8 mg/dL (ref 212.0–360.0)
Urobilinogen, UA: 0.2 (ref 0.0–1.0)
pH: 5.5 (ref 5.0–8.0)

## 2010-05-22 ENCOUNTER — Encounter: Payer: Self-pay | Admitting: Internal Medicine

## 2010-06-18 ENCOUNTER — Encounter: Payer: Self-pay | Admitting: Internal Medicine

## 2010-06-18 LAB — CONVERTED CEMR LAB: Hgb A1c MFr Bld: 6.3 %

## 2010-06-19 ENCOUNTER — Encounter: Payer: Self-pay | Admitting: Internal Medicine

## 2010-06-26 ENCOUNTER — Ambulatory Visit
Admission: RE | Admit: 2010-06-26 | Discharge: 2010-06-26 | Payer: Self-pay | Source: Home / Self Care | Attending: Internal Medicine | Admitting: Internal Medicine

## 2010-06-30 ENCOUNTER — Telehealth: Payer: Self-pay | Admitting: Internal Medicine

## 2010-07-08 NOTE — Progress Notes (Signed)
Summary: Pacer Check   Phone Note Call from Patient Call back at Home Phone [phone]   Caller: Patient Reason for Call: Talk to Nurse Summary of Call: states Dr Katrinka Blazing @ [facility] has been check his pacer, does he still need to see Dr Graciela Husbands Initial call taken by: Migdalia Dk,  September 26, 2009 11:42 AM  Follow-up for Phone Call        BUSY X1 Scherrie Bateman, LPN  September 26, 2009 11:45 AM PER AMBER DOES NOT NEED TO SEE DR Graciela Husbands . PT AWARE. Follow-up by: Scherrie Bateman, LPN,  September 26, 2009 11:54 AM

## 2010-07-08 NOTE — Assessment & Plan Note (Signed)
Summary: rov./ gd  Medications Added ISOSORBIDE MONONITRATE CR 60 MG XR24H-TAB (ISOSORBIDE MONONITRATE) take1 and 1/2 tab by mouth once daily      Allergies Added:   Visit Type:  Pacemaker check Referring Provider:  na Primary Provider:  Otho Najjar, MD pcp----Dr Diamond Nickel at [place]  CC:  Pt sees Dr Katrinka Blazing at [facility] complaints per pt concerning his pacer.  History of Present Illness: Eric [name] is seen in followup for pacemaker implanted for tachybradycardia syndrome  He is doing relatively well and his back has felt better follwoing steroid injections.       Current Medications (verified): 1)  Niacin Cr 1000 Mg  Tbcr (Niacin) .... Take 2 At Bedtime 2)  Ecotrin 325 Mg  Tbec (Aspirin) .... One By Mouth Once Daily 3)  Bumetanide 1 Mg  Tabs (Bumetanide) .... One By Mouth Once Daily 4)  Diovan 160 Mg  Tabs (Valsartan) .[name].. 1 By Mouth Two Times A Day 5)  Pravastatin Sodium 40 Mg Tabs (Pravastatin Sodium) .... Take 2 At Bedtime 6)  Plavix 75 Mg  Tabs (Clopidogrel Bisulfate) .... Take 1 Tablet By Mouth Once A Day 7)  Nexium 40 Mg  Cpdr (Esomeprazole Magnesium) .[name].. 1 Capsule Each Day 30 Minutes Before Meal 8)  Albuterol Sulfate (2.5 Mg/33ml) 0.083% Nebu (Albuterol Sulfate) .... 2.5 Mg Neb Three Times A Day As Needed 9)  Norvasc 5 Mg Tabs (Amlodipine Besylate) .... Take One Tabletqd 10)  B-12 500 Mcg Tabs (Cyanocobalamin) .... Take 1 Tablet By Mouth Two Times A Day 11)  Gas-X 80 Mg Chew (Simethicone) .... As Needed 12)  Isosorbide Mononitrate Cr 60 Mg Xr24h-Tab (Isosorbide Mononitrate) .... Take1 and 1/2 Tab By Mouth Once Daily 13)  Promethazine Hcl 25 Mg Tabs (Promethazine Hcl) .... Take 1/2 Tab Every 6 Hours As Needed For Nausea 14)  Mylanta 200-200-20 Mg/57ml Susp (Alum & Mag Hydroxide-Simeth) .... As Needed 15)  Maalox Plus 225-200-25 Mg/69ml Susp (Alum & Mag Hydroxide-Simeth) .... As Needed 16)  Senokot 8.6 Mg Tabs (Sennosides) .... Three Tablets By Mouth At Bedtime Two  Times A Week  Allergies (verified): 1)  ! * Actos 2)  ! Cipro 3)  ! * Metformin  Past History:  Past Medical History: Last updated: 02/21/2010 COPD   Coronary artery disease - ischemic cardiomyopathy EF 50% GERD Hyperlipidemia   Hypertension Peripheral vascular disease  Diverticulitis, hx of  Asthma Diabetes mellitus, type II Symptomatic Bradycardia - S/P dual chamber pacemaker, St. Jude model 09/20/2007 PTCA for unstable angina 06/10/2007 - Coronary atherosclerotic heart disease with unstable angina due to       high-grade obstruction in the saphenous vein graft in the first  diagonal.       a.     Resolved after restenting of restenotic Cypher stent in the        proximal graft and new 99% disease in the mid graft.  Partial Small Bowel Obstruction 2009 History of Prostate CA  Low back pain - advanced spondylosis CT myelogram 08/2008 IBS    MD roster: Rolly Salter Sharl Ma ortho - beane/ramos card - nishan/klein - smith gi - gessner uro - wrenn ent - bates  Vital Signs:  Patient profile:   77 year old male Height:      66 inches Weight:      244 pounds BMI:     39.52 Pulse rate:   76 / minute BP sitting:   113 / 57  (left arm) Cuff size:   large  Vitals  Entered By: Eric Kanaris, CNA (March 26, 2010 9:06 AM)  Physical Exam  General:  The patient was alert and oriented in no acute distress. HEENT Normal.   Lungs were clear.  Heart sounds were regular without murmurs or gallops.  Abdomen was soft with active bowel sounds. There is no clubbing cyanosis or edema. Skin Warm and dry    PPM Specifications Following MD:  Sherryl Manges, MD     PPM Vendor:  St Jude     PPM Model Number:  [id number]     PPM Serial Number:  [id number] PPM DOI:  09/20/2007     PPM Implanting MD:  Sherryl Manges, MD  Lead 1    Location: RA     DOI: 09/20/2007     Model #: [id number]     Serial #: [id number]     Status: active Lead 2    Location: RV     DOI: 09/20/2007     Model #: [id number]     Serial #:  [id number]     Status: active   Indications:  Sick Sinus Syndrome   PPM Follow Up Battery Voltage:  2.79 V     Battery Est. Longevity:  9.50-10 yrs     Pacer Dependent:  No       PPM Device Measurements Atrium  Amplitude: 5.0 mV, Impedance: 391 ohms, Threshold: 0.50 V at 0.4 msec Right Ventricle  Amplitude: 10.0 mV, Impedance: 471 ohms, Threshold: 0.750 V at 0.4 msec  Episodes MS Episodes:  2     Percent Mode Switch:  <1%     Coumadin:  No Ventricular High Rate:  0     Atrial Pacing:  51%     Ventricular Pacing:  <1%  Parameters Mode:  DDDR     Lower Rate Limit:  60     Upper Rate Limit:  110 Paced AV Delay:  200     Sensed AV Delay:  200 Tech Comments:  NORMAL DEVICE FUNCTION.  2 AMS EPISODES--BOTH 6 SECONDS LONG.  NO CHANGES MADE.  NO FOLLOWUP NEEDED--PT TO BE FOLLOWED AT [facility]. Vella Kohler  Impression & Recommendations:  Problem # 1:  CARDIOMYOPATHY, ISCHEMIC (ICD-414.8)  stableon current meds His updated medication list for this problem includes:    Ecotrin 325 Mg Tbec (Aspirin) ..... One by mouth once daily    Bumetanide 1 Mg Tabs (Bumetanide) ..... One by mouth once daily    Diovan 160 Mg Tabs (Valsartan) .[name]... 1 by mouth two times a day    Plavix 75 Mg Tabs (Clopidogrel bisulfate) .[name]... Take 1 tablet by mouth once a day    Norvasc 5 Mg Tabs (Amlodipine besylate) .[name]... Take one tabletqd    Isosorbide Mononitrate Cr 60 Mg Xr24h-tab (Isosorbide mononitrate) .[name]... Take1 and 1/2 tab by mouth once daily  His updated medication list for this problem includes:    Ecotrin 325 Mg Tbec (Aspirin) ..... One by mouth once daily    Bumetanide 1 Mg Tabs (Bumetanide) ..... One by mouth once daily    Diovan 160 Mg Tabs (Valsartan) .[name]... 1 by mouth two times a day    Plavix 75 Mg Tabs (Clopidogrel bisulfate) .[name]... Take 1 tablet by mouth once a day    Norvasc 5 Mg Tabs (Amlodipine besylate) .[name]... Take one tabletqd    Isosorbide Mononitrate Cr 60 Mg Xr24h-tab (Isosorbide  mononitrate) .[name]... Take1 and 1/2 tab by mouth once daily  Problem # 2:  PACEMAKER, PERMANENT  ST JUDES (ICD-V45.01) Device  parameters and data were reviewed and no changes were made  He will followup in the future with Dr Garnette Scheuermann  Problem # 3:  CAROTID BRUIT, RIGHT (ICD-785.9) repeat vascular studies ordered  Problem # 4:  SICK SINUS/ TACHY-BRADY SYNDROME (ICD-427.81)  about 40% atrial paced His updated medication list for this problem includes:    Ecotrin 325 Mg Tbec (Aspirin) ..... One by mouth once daily    Plavix 75 Mg Tabs (Clopidogrel bisulfate) .[name]... Take 1 tablet by mouth once a day    Norvasc 5 Mg Tabs (Amlodipine besylate) .[name]... Take one tabletqd    Isosorbide Mononitrate Cr 60 Mg Xr24h-tab (Isosorbide mononitrate) .[name]... Take1 and 1/2 tab by mouth once daily  His updated medication list for this problem includes:    Ecotrin 325 Mg Tbec (Aspirin) ..... One by mouth once daily    Plavix 75 Mg Tabs (Clopidogrel bisulfate) .[name]... Take 1 tablet by mouth once a day    Norvasc 5 Mg Tabs (Amlodipine besylate) .[name]... Take one tabletqd    Isosorbide Mononitrate Cr 60 Mg Xr24h-tab (Isosorbide mononitrate) .[name]... Take1 and 1/2 tab by mouth once daily

## 2010-07-08 NOTE — Assessment & Plan Note (Signed)
Summary: 3-4 MTH FU  STC   Vital Signs:  Patient profile:   77 year old male Height:      66 inches (167.64 cm) Weight:      256.4 pounds (116.55 kg) O2 Sat:      94 % on Room air Temp:     98.1 degrees F (36.72 degrees C) oral Pulse rate:   60 / minute BP sitting:   132 / 60  (left arm) Cuff size:   large  Vitals Entered By: Orlan Leavens (July 18, 2009 10:04 AM)  O2 Flow:  Room air CC: 3 month follow-up Is Patient Diabetic? Yes Did you bring your meter with you today? No Pain Assessment Patient in pain? no        Primary Care Provider:  Newt Lukes MD  CC:  3 month follow-up.  History of Present Illness: here for 3 mo f/u:  DM2 - stopped Actos due to fluid retention and rash - no longer on metformin either now seeing endo, kerr - who stopped all meds because "blood and urine did not show any diabetes" checks sugars infreq now - only goes over 120 if eat something sweet   HTN -  reports compliance with ongoing medical treatment and no changes in medication dose or frequency. denies adverse side effects related to current therapy.   dyslipidemia - reports compliance with ongoing medical treatment and no changes in medication dose or frequency. denies adverse side effects related to current therapy.    L great toe - still using salve and covering - mgmt as ongoing by podiatrist - improving overall  just has "tubes" of blood done at [facility] 2 weeks ago...  Clinical Review Panels:  Immunizations   Last Tetanus Booster:  Td (08/07/2008)   Last Flu Vaccine:  Declined (03/11/2009)   Last Pneumovax:  Pneumovax (Medicare) (02/27/2008)  Lipid Management   Cholesterol:  140 (04/18/2009)   LDL (bad choesterol):  85 (04/18/2009)   HDL (good cholesterol):  36.70 (04/18/2009)  Diabetes Management   HgBA1C:  6.6 (04/18/2009)   Creatinine:  1.1 (04/18/2009)   Last Foot Exam:  yes (06/12/2009)   Last Flu Vaccine:  Declined (03/11/2009)   Last  Pneumovax:  Pneumovax (Medicare) (02/27/2008)  CBC   WBC:  4.3 (01/18/2009)   RBC:  3.56 (01/18/2009)   Hgb:  12.1 (01/18/2009)   Hct:  35.5 (01/18/2009)   Platelets:  135.0 (01/18/2009)   MCV  99.7 (01/18/2009)   MCHC  34.1 (01/18/2009)   RDW  12.3 (01/18/2009)   PMN:  74.2 (01/18/2009)   Lymphs:  11.5 (01/18/2009)   Monos:  9.1 (01/18/2009)   Eosinophils:  4.9 (01/18/2009)   Basophil:  0.3 (01/18/2009)  Complete Metabolic Panel   Glucose:  128 (04/18/2009)   Sodium:  141 (04/18/2009)   Potassium:  4.1 (04/18/2009)   Chloride:  106 (04/18/2009)   CO2:  25 (04/18/2009)   BUN:  14 (04/18/2009)   Creatinine:  1.1 (04/18/2009)   Albumin:  4.0 (03/20/2008)   Total Protein:  6.8 (03/20/2008)   Calcium:  9.4 (04/18/2009)   Total Bili:  0.6 (03/20/2008)   Alk Phos:  52 (03/20/2008)   SGPT (ALT):  18 (03/20/2008)   SGOT (AST):  25 (03/20/2008)   Current Medications (verified): 1)  Niacin Cr 1000 Mg  Tbcr (Niacin) .... Take 2 At Bedtime 2)  Ecotrin 325 Mg  Tbec (Aspirin) .... One By Mouth Once Daily 3)  Bumetanide 1 Mg  Tabs (Bumetanide) .Marland KitchenMarland KitchenMarland Kitchen  One By Mouth Once Daily 4)  Diovan 160 Mg  Tabs (Valsartan) .[name].. 1 By Mouth Two Times A Day 5)  Pravastatin Sodium 40 Mg Tabs (Pravastatin Sodium) .... Take 2 At Bedtime 6)  Plavix 75 Mg  Tabs (Clopidogrel Bisulfate) .... Take 1 Tablet By Mouth Once A Day 7)  Nexium 40 Mg  Cpdr (Esomeprazole Magnesium) .[name].. 1 Capsule Each Day 30 Minutes Before Meal 8)  Albuterol Sulfate (2.5 Mg/4ml) 0.083% Nebu (Albuterol Sulfate) .... 2.5 Mg Neb Three Times A Day As Needed 9)  Norvasc 5 Mg Tabs (Amlodipine Besylate) .... Take One Tabletqd 10)  B-12 500 Mcg Tabs (Cyanocobalamin) .... Take 1 Tablet By Mouth Two Times A Day 11)  Gas-X 80 Mg Chew (Simethicone) .... As Needed  Allergies (verified): 1)  ! * Actos  Past History:  Past Medical History: COPD  Coronary artery disease - ischemic cardiomyopathy EF 50% GERD Hyperlipidemia    Hypertension Peripheral vascular disease Diverticulitis, hx of Asthma Diabetes mellitus, type II Symptomatic Bradycardia - S/P dual chamber pacemaker, St. Jude model 09/20/2007 PTCA for unstable angina 06/10/2007 - Coronary atherosclerotic heart disease with unstable angina due to       high-grade obstruction in the saphenous vein graft in the first  diagonal.       a.     Resolved after restenting of restenotic Cypher stent in the        proximal graft and new 99% disease in the mid graft.  Partial Small Bowell Obstruction  History of Prostate CA  Low back pain - advanced spondylosis CT myelogram 08/2008  MD rooster: endo Sharl Ma  Review of Systems  The patient denies fever, weight loss, weight gain, chest pain, syncope, and peripheral edema.    Physical Exam  General:  overweight-appearing.  alert, well-developed, well-nourished, and cooperative to examination.    Lungs:  normal respiratory effort, no intercostal retractions or use of accessory muscles; normal breath sounds bilaterally - no crackles and no wheezes.    Heart:  normal rate, regular rhythm, no murmur, and no rub. BLE without edema.    Impression & Recommendations:  Problem # 1:  DIABETES MELLITUS-TYPE II (ICD-250.00) diet controlled - will send for copies of lbs done at [facility] and ask that endo include [place] in future corespondece The following medications were removed from the medication list:    Metformin Hcl 1000 Mg Tabs (Metformin hcl) .[name]... 1 by mouth once daily His updated medication list for this problem includes:    Ecotrin 325 Mg Tbec (Aspirin) ..... One by mouth once daily    Diovan 160 Mg Tabs (Valsartan) .[name]... 1 by mouth two times a day  Labs Reviewed: Creat: 1.1 (04/18/2009)    Reviewed HgBA1c results: 6.6 (04/18/2009)  6.3 (11/08/2008)  Problem # 2:  HYPERTENSION (ICD-401.9)  His updated medication list for this problem includes:    Bumetanide 1 Mg Tabs (Bumetanide) ..... One by mouth once daily     Diovan 160 Mg Tabs (Valsartan) .[name]... 1 by mouth two times a day    Norvasc 5 Mg Tabs (Amlodipine besylate) .[name]... Take one tabletqd  BP today: 132/60 Prior BP: 134/60 (06/12/2009)  Labs Reviewed: K+: 4.1 (04/18/2009) Creat: : 1.1 (04/18/2009)   Chol: 140 (04/18/2009)   HDL: 36.70 (04/18/2009)   LDL: 85 (04/18/2009)   TG: 91.0 (04/18/2009)  Problem # 3:  HYPERLIPIDEMIA (ICD-272.4)  His updated medication list for this problem includes:    Niacin Cr 1000 Mg Tbcr (Niacin) .[name]... Take 2  at bedtime    Pravastatin Sodium 40 Mg Tabs (Pravastatin sodium) .[name]... Take 2 at bedtime  Labs Reviewed: SGOT: 25 (03/20/2008)   SGPT: 18 (03/20/2008)   HDL:36.70 (04/18/2009)  LDL:85 (04/18/2009)  Chol:140 (04/18/2009)  Trig:91.0 (04/18/2009)  Problem # 4:  INGROWN TOENAIL (ICD-703.0) Assessment: Improved removal by podiatry -  cont care as ongoing  Complete Medication List: 1)  Niacin Cr 1000 Mg Tbcr (Niacin) .... Take 2 at bedtime 2)  Ecotrin 325 Mg Tbec (Aspirin) .... One by mouth once daily 3)  Bumetanide 1 Mg Tabs (Bumetanide) .... One by mouth once daily 4)  Diovan 160 Mg Tabs (Valsartan) .[name].. 1 by mouth two times a day 5)  Pravastatin Sodium 40 Mg Tabs (Pravastatin sodium) .... Take 2 at bedtime 6)  Plavix 75 Mg Tabs (Clopidogrel bisulfate) .... Take 1 tablet by mouth once a day 7)  Nexium 40 Mg Cpdr (Esomeprazole magnesium) .[name].. 1 capsule each day 30 minutes before meal 8)  Albuterol Sulfate (2.5 Mg/66ml) 0.083% Nebu (Albuterol sulfate) .... 2.5 mg neb three times a day as needed 9)  Norvasc 5 Mg Tabs (Amlodipine besylate) .... Take one tabletqd 10)  B-12 500 Mcg Tabs (Cyanocobalamin) .... Take 1 tablet by mouth two times a day 11)  Gas-x 80 Mg Chew (Simethicone) .... As needed  Patient Instructions: 1)  it was good to see you today.  2)  will send for lab records from [place] to review rather than drawing blood work here today - 3)  please also ask Dr. Sharl Ma to send copies of his records  to [place] whenever you see him 4)  no medication changes recommended- keep doing what you are doing 5)  Please schedule a follow-up appointment in 4 months, sooner if problems.

## 2010-07-08 NOTE — Cardiovascular Report (Signed)
Summary: Office Visit   Office Visit   Imported By: Roderic Ovens 03/27/2010 12:03:20  _____________________________________________________________________  External Attachment:    Type:   Image     Comment:   External Document

## 2010-07-08 NOTE — Letter (Signed)
Summary: Device-Delinquent Check  [facility], Main Office  [address]   [place], [place] [address]   Phone: [phone]  Fax: [phone]     January 06, 2010 MRN: [id number]   Eric [name] [address] [place], [place]  [address]   Dear Mr. Rodeheaver,  According to our records, you have not had your implanted device checked in the recommended period of time.  We are unable to determine appropriate device function without checking your device on a regular basis.  Please call our office to schedule an appointment as soon as possible.  If you are having your device checked by another physician, please call us so that we may update our records.  Thank you,   [facility]
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
## 2010-07-08 NOTE — Assessment & Plan Note (Signed)
Summary: 4 MTH FU---STC   Vital Signs:  Patient profile:   77 year old male Height:      66 inches (167.64 cm) Weight:      251.8 pounds (114.45 kg) O2 Sat:      93 % on Room air Temp:     98.3 degrees F (36.83 degrees C) oral Pulse rate:   59 / minute BP sitting:   132 / 60  (right arm) Cuff size:   large  Vitals Entered By: Orlan Leavens (November 21, 2009 10:43 AM)  O2 Flow:  Room air CC: 4 month f/u Is Patient Diabetic? No Pain Assessment Patient in pain? no        Primary Care Provider:  Otho Najjar, MD  CC:  4 month f/u.  History of Present Illness: continued falls and balance issues- 4 falls so far this year - attributes fall and balance problems to pain in right hip - and back has undergone injection to right hip with near 100% resolution of pain and wa;king/balance problems- would like to work with PT for balance - will call with name of place in [place]  DM2 - stopped Actos due to fluid retention and rash - no longer on metformin either now seeing endo, [facility] - who stopped all meds because "blood and urine did not show any diabetes" checks sugars infreq now - only goes over 120 if eat something sweet   HTN -  reports compliance with ongoing medical treatment and no changes in medication dose or frequency. denies adverse side effects related to current therapy.   dyslipidemia - reports compliance with ongoing medical treatment and no changes in medication dose or frequency. denies adverse side effects related to current therapy.   L great toe - still using salve and covering - mgmt as ongoing by podiatrist - improving overall   Clinical Review Panels:  Lipid Management   Cholesterol:  96 (07/01/2009)   LDL (bad choesterol):  19 (07/01/2009)   HDL (good cholesterol):  30 (07/01/2009)   Triglycerides:  233 (07/01/2009)  Diabetes Management   HgBA1C:  6.4 (07/01/2009)   Creatinine:  0.9 (09/17/2009)   Last Foot Exam:  yes (06/12/2009)   Last Flu  Vaccine:  Declined (03/11/2009)   Last Pneumovax:  Pneumovax (Medicare) (02/27/2008)  CBC   WBC:  4.3 (09/17/2009)   RBC:  3.95 (09/17/2009)   Hgb:  13.1 (09/17/2009)   Hct:  37.7 (09/17/2009)   Platelets:  170.0 (09/17/2009)   MCV  95.6 (09/17/2009)   MCHC  34.8 (09/17/2009)   RDW  12.9 (09/17/2009)   PMN:  71.1 (09/17/2009)   Lymphs:  12.7 (09/17/2009)   Monos:  11.7 (09/17/2009)   Eosinophils:  4.3 (09/17/2009)   Basophil:  0.2 (09/17/2009)  Complete Metabolic Panel   Glucose:  116 (09/17/2009)   Sodium:  142 (09/17/2009)   Potassium:  5.1 (09/17/2009)   Chloride:  108 (09/17/2009)   CO2:  29 (09/17/2009)   BUN:  13 (09/17/2009)   Creatinine:  0.9 (09/17/2009)   Albumin:  3.9 (07/01/2009)   Total Protein:  7.8 (07/01/2009)   Calcium:  10.3 (09/17/2009)   Total Bili:  0.3 (07/01/2009)   Alk Phos:  77 (07/01/2009)   SGPT (ALT):  29 (07/01/2009)   SGOT (AST):  20 (07/01/2009)   Current Medications (verified): 1)  Niacin Cr 1000 Mg  Tbcr (Niacin) .... Take 2 At Bedtime 2)  Ecotrin 325 Mg  Tbec (Aspirin) .... One By Mouth Once Daily  3)  Bumetanide 1 Mg  Tabs (Bumetanide) .... One By Mouth Once Daily 4)  Diovan 160 Mg  Tabs (Valsartan) .[name].. 1 By Mouth Two Times A Day 5)  Pravastatin Sodium 40 Mg Tabs (Pravastatin Sodium) .... Take 2 At Bedtime 6)  Plavix 75 Mg  Tabs (Clopidogrel Bisulfate) .... Take 1 Tablet By Mouth Once A Day 7)  Nexium 40 Mg  Cpdr (Esomeprazole Magnesium) .[name].. 1 Capsule Each Day 30 Minutes Before Meal 8)  Albuterol Sulfate (2.5 Mg/10ml) 0.083% Nebu (Albuterol Sulfate) .... 2.5 Mg Neb Three Times A Day As Needed 9)  Norvasc 5 Mg Tabs (Amlodipine Besylate) .... Take One Tabletqd 10)  B-12 500 Mcg Tabs (Cyanocobalamin) .... Take 1 Tablet By Mouth Two Times A Day 11)  Gas-X 80 Mg Chew (Simethicone) .... As Needed 12)  Isosorbide Mononitrate Cr 60 Mg Xr24h-Tab (Isosorbide Mononitrate) .... Take 1 By Mouth Qd 13)  Promethazine Hcl 25 Mg Tabs (Promethazine  Hcl) .... Take 1/2 Tab Every 6 Hours As Needed For Nausea 14)  Mylanta 200-200-20 Mg/77ml Susp (Alum & Mag Hydroxide-Simeth) .... As Needed 15)  Maalox Plus 225-200-25 Mg/27ml Susp (Alum & Mag Hydroxide-Simeth) .... As Needed 16)  Senokot 8.6 Mg Tabs (Sennosides) .... Three Tablets By Mouth At Bedtime Two Times A Week 17)  Alophen 5 Mg Tbec (Bisacodyl) .... Two Tablets By Mouth At Bedtime Five Times A Week  Allergies (verified): 1)  ! * Actos 2)  ! Cipro  Past History:  Past Medical History: COPD  Coronary artery disease - ischemic cardiomyopathy EF 50% GERD Hyperlipidemia   Hypertension Peripheral vascular disease   Diverticulitis, hx of  Asthma Diabetes mellitus, type II Symptomatic Bradycardia - S/P dual chamber pacemaker, St. Jude model 09/20/2007 PTCA for unstable angina 06/10/2007 - Coronary atherosclerotic heart disease with unstable angina due to       high-grade obstruction in the saphenous vein graft in the first  diagonal.       a.     Resolved after restenting of restenotic Cypher stent in the        proximal graft and new 99% disease in the mid graft.  Partial Small BoweL Obstruction 2009 History of Prostate CA  Low back pain - advanced spondylosis CT myelogram 08/2008  MD roster: Rolly Salter Sharl Ma ortho - beane/ramos card - nishan/klein gi - gessner uro - wrenn  Review of Systems  The patient denies anorexia, chest pain, and headaches.    Physical Exam  General:  overweight-appearing.  alert, well-developed, well-nourished, and cooperative to examination.   obvious bruising below both eyes Lungs:  normal respiratory effort, no intercostal retractions or use of accessory muscles; normal breath sounds bilaterally - no crackles and no wheezes.    Heart:  normal rate, regular rhythm, no murmur, and no rub. BLE without edema. Msk:  No deformity or scoliosis noted of thoracic or lumbar spine.   Neurologic:  alert & oriented X3 and cranial nerves II-XII symetrically  intact.  strength normal in all extremities, sensation intact to light touch, and gait normal. speech fluent without dysarthria or aphasia; follows commands with good comprehension.    Impression & Recommendations:  Problem # 1:  GAIT DISTURBANCE (ICD-781.2)  falls reviewed - now feels improved since shot with [place] ortho in right hip (never had relief of same pain following back surg) refer to PT for balance and strength training - pt will call with name of prefered local PT [place]  Orders: Physical Therapy Referral (PT)  Problem # 2:  HYPERTENSION (ICD-401.9)  His updated medication list for this problem includes:    Bumetanide 1 Mg Tabs (Bumetanide) ..... One by mouth once daily    Diovan 160 Mg Tabs (Valsartan) .[name]... 1 by mouth two times a day    Norvasc 5 Mg Tabs (Amlodipine besylate) .[name]... Take one tabletqd  BP today: 132/60 Prior BP: 120/64 (10/23/2009)  Labs Reviewed: K+: 5.1 (09/17/2009) Creat: : 0.9 (09/17/2009)   Chol: 96 (07/01/2009)   HDL: 30 (07/01/2009)   LDL: 19 (07/01/2009)   TG: 233 (07/01/2009)  Problem # 3:  HYPERLIPIDEMIA (ICD-272.4)  His updated medication list for this problem includes:    Niacin Cr 1000 Mg Tbcr (Niacin) .[name]... Take 2 at bedtime    Pravastatin Sodium 40 Mg Tabs (Pravastatin sodium) .[name]... Take 2 at bedtime  Labs Reviewed: SGOT: 20 (07/01/2009)   SGPT: 29 (07/01/2009)   HDL:30 (07/01/2009), 36.70 (04/18/2009)  LDL:19 (07/01/2009), 85 (04/18/2009)  Chol:96 (07/01/2009), 140 (04/18/2009)  Trig:233 (07/01/2009), 91.0 (04/18/2009)  Problem # 4:  DIABETES MELLITUS-TYPE II (ICD-250.00)  endo "pt instruction" from kerr reviewed - a1c just done 10/2009 but results unkown to me - mgmt per endo - diet controlled, now off actos and metformin since spring 2011  His updated medication list for this problem includes:    Ecotrin 325 Mg Tbec (Aspirin) ..... One by mouth once daily    Diovan 160 Mg Tabs (Valsartan) .[name]... 1 by mouth two times a  day  Labs Reviewed: Creat: 0.9 (09/17/2009)    Reviewed HgBA1c results: 6.4 (07/01/2009)  6.6 (04/18/2009)  Problem # 5:  OBESITY (ICD-278.00)  Ht: 66 (11/21/2009)   Wt: 251.8 (11/21/2009)   BMI: 40.50 (10/23/2009)  Complete Medication List: 1)  Niacin Cr 1000 Mg Tbcr (Niacin) .... Take 2 at bedtime 2)  Ecotrin 325 Mg Tbec (Aspirin) .... One by mouth once daily 3)  Bumetanide 1 Mg Tabs (Bumetanide) .... One by mouth once daily 4)  Diovan 160 Mg Tabs (Valsartan) .[name].. 1 by mouth two times a day 5)  Pravastatin Sodium 40 Mg Tabs (Pravastatin sodium) .... Take 2 at bedtime 6)  Plavix 75 Mg Tabs (Clopidogrel bisulfate) .... Take 1 tablet by mouth once a day 7)  Nexium 40 Mg Cpdr (Esomeprazole magnesium) .[name].. 1 capsule each day 30 minutes before meal 8)  Albuterol Sulfate (2.5 Mg/66ml) 0.083% Nebu (Albuterol sulfate) .... 2.5 mg neb three times a day as needed 9)  Norvasc 5 Mg Tabs (Amlodipine besylate) .... Take one tabletqd 10)  B-12 500 Mcg Tabs (Cyanocobalamin) .... Take 1 tablet by mouth two times a day 11)  Gas-x 80 Mg Chew (Simethicone) .... As needed 12)  Isosorbide Mononitrate Cr 60 Mg Xr24h-tab (Isosorbide mononitrate) .... Take 1 by mouth qd 13)  Promethazine Hcl 25 Mg Tabs (Promethazine hcl) .... Take 1/2 tab every 6 hours as needed for nausea 14)  Mylanta 200-200-20 Mg/39ml Susp (Alum & mag hydroxide-simeth) .... As needed 15)  Maalox Plus 225-200-25 Mg/72ml Susp (Alum & mag hydroxide-simeth) .... As needed 16)  Senokot 8.6 Mg Tabs (Sennosides) .... Three tablets by mouth at bedtime two times a week 17)  Alophen 5 Mg Tbec (Bisacodyl) .... Two tablets by mouth at bedtime five times a week  Patient Instructions: 1)  it was good to see you today.  2)  let us know what physical therapy you would like [place] to refer you to for balance training and stretching exercises and treatment 3)  [facility] and [facility] other [facility] as ongoing - records reviewed as  available 4)  Please schedule a follow-up appointment in 3-4 months , sooner if problems.

## 2010-07-08 NOTE — Letter (Signed)
Summary: [facility]   Imported By: Lester Inavale 12/27/2009 10:03:51  _____________________________________________________________________  External Attachment:    Type:   Image     Comment:   External Document

## 2010-07-08 NOTE — Progress Notes (Signed)
Summary: Toe issue--Diabetic  Phone Note Call from Patient   Summary of Call: patient left message on triage that he is having problems with his great left toe. He is using cream with no relief and would like 2/21 appt moved up.  Left message with spouse for patient to call and move appt up. Initial call taken by: Lucious Groves,  June 12, 2009 8:12 AM  Follow-up for Phone Call        pt's spouse advise of above and will have pt call back to re-schedule appt Follow-up by: Margaret Pyle, CMA,  June 12, 2009 8:51 AM

## 2010-07-08 NOTE — Progress Notes (Signed)
Summary: med side effects   Phone Note Call from Patient Call back at Home Phone [phone]   Caller: Patient Call For: Dr. Leone Payor Reason for Call: Talk to Nurse Summary of Call: would like to discuss side effects of Cipro and Metronidazole Initial call taken by: Vallarie Mare,  Oct 29, 2009 9:21 AM  Follow-up for Phone Call        Patient  has been on Cipro and flagyl since 10/23/09, for the last few days he has developed severe bilateral knee pain, swelling  and having difficulty walking.  I reviewed with him that cipro can cause  arthralgias.  He reports that there are no real improvements with his bloating and abdominal pain either.  He reports bloating after even small meals. Please advise. Follow-up by: Darcey Nora RN, CGRN,  Oct 29, 2009 10:53 AM  Additional Follow-up for Phone Call Additional follow up Details #1::        stop cipro, finish metronidazole see pcp to extremities assessed - let PCP know cc them) keep GI follow-up as planned and call back as needed list cipro as allergy/sensitivity  Additional Follow-up by: Iva Boop MD, Clementeen Graham,  Oct 29, 2009 11:08 AM   New Allergies: ! CIPRO Additional Follow-up for Phone Call Additional follow up Details #2::    I spoke with patient and he has an appointment with Dr Shelle Iron tomorrow.  I will route this  to Dr Felicity Coyer also.  Patient advised of Dr Marvell Fuller orders. Follow-up by: Darcey Nora RN, CGRN,  Oct 29, 2009 11:37 AM  New Allergies: ! CIPRO

## 2010-07-08 NOTE — Letter (Signed)
Summary: [facility]   Imported By: Sherian Rein 11/05/2009 08:11:53  _____________________________________________________________________  External Attachment:    Type:   Image     Comment:   External Document

## 2010-07-08 NOTE — Assessment & Plan Note (Signed)
Summary: EPIGASTRIC PAIN, DARK STOOLS             Eric [name]   History of Present Illness Visit Type: Follow-up Visit Primary GI MD: Stan Head MD [name] Primary Provider: Otho Najjar, MD Requesting Provider: n/a Chief Complaint: abdominal pain, dark stools History of Present Illness:   77 yo wm with chronic recurrent abdominal pain. He has IBS, has had partial SBO's and is thought to have small bowel bacterial overgrowth. He had responded to antbiotics in the past. He was here in May and took cipro and metronidazole with help though thinks the cipro made his joints hurt so stopped it prematurely. Now with weeks of diffuse abdominal pain, worse with walking (not eating). Quite severe overall, had green loose stools and those are better.  He had lost soe weight but that has levelled off. This is an exacerbation of prior problems.   GI Review of Systems    Reports abdominal pain, acid reflux, heartburn, loss of appetite, nausea, and  weight loss.     Location of  Abdominal pain: lower abdomen.    Denies belching, bloating, chest pain, dysphagia with liquids, dysphagia with solids, vomiting, vomiting blood, and  weight gain.      Reports black tarry stools.     Denies anal fissure, change in bowel habit, constipation, diarrhea, diverticulosis, fecal incontinence, heme positive stool, hemorrhoids, irritable bowel syndrome, jaundice, light color stool, liver problems, rectal bleeding, and  rectal pain.    Current Medications (verified): 1)  Niacin Cr 1000 Mg  Tbcr (Niacin) .... Take 2 At Bedtime 2)  Ecotrin 325 Mg  Tbec (Aspirin) .... One By Mouth Once Daily 3)  Bumetanide 1 Mg  Tabs (Bumetanide) .... One By Mouth Once Daily 4)  Diovan 160 Mg  Tabs (Valsartan) .[name].. 1 By Mouth Two Times A Day 5)  Pravastatin Sodium 40 Mg Tabs (Pravastatin Sodium) .... Take 2 At Bedtime 6)  Plavix 75 Mg  Tabs (Clopidogrel Bisulfate) .... Take 1 Tablet By Mouth Once A Day 7)  Nexium 40 Mg  Cpdr (Esomeprazole  Magnesium) .[name].. 1 Capsule Each Day 30 Minutes Before Meal 8)  Albuterol Sulfate (2.5 Mg/43ml) 0.083% Nebu (Albuterol Sulfate) .... 2.5 Mg Neb Three Times A Day As Needed 9)  Norvasc 5 Mg Tabs (Amlodipine Besylate) .... Take One Tabletqd 10)  B-12 500 Mcg Tabs (Cyanocobalamin) .... Take 1 Tablet By Mouth Two Times A Day 11)  Gas-X 80 Mg Chew (Simethicone) .... As Needed 12)  Isosorbide Mononitrate Cr 60 Mg Xr24h-Tab (Isosorbide Mononitrate) .... Take 1/2 Tab By Mouth Once Daily 13)  Promethazine Hcl 25 Mg Tabs (Promethazine Hcl) .... Take 1/2 Tab Every 6 Hours As Needed For Nausea 14)  Mylanta 200-200-20 Mg/2ml Susp (Alum & Mag Hydroxide-Simeth) .... As Needed 15)  Maalox Plus 225-200-25 Mg/53ml Susp (Alum & Mag Hydroxide-Simeth) .... As Needed 16)  Senokot 8.6 Mg Tabs (Sennosides) .... Three Tablets By Mouth At Bedtime Two Times A Week 17)  Alophen 5 Mg Tbec (Bisacodyl) .... Two Tablets By Mouth At Bedtime Five Times A Week 18)  Meclizine Hcl 12.5 Mg Tabs (Meclizine Hcl) .[name].. 1-2 By Mouth Every 4 Hours As Needed For Dizzy Symptoms  Allergies: 1)  ! * Actos 2)  ! Cipro 3)  ! * Metformin  Past History:  Past Medical History: COPD  Coronary artery disease - ischemic cardiomyopathy EF 50% GERD Hyperlipidemia   Hypertension Peripheral vascular disease  Diverticulitis, hx of  Asthma Diabetes mellitus, type II Symptomatic Bradycardia -  S/P dual chamber pacemaker, St. Jude model 09/20/2007 PTCA for unstable angina 06/10/2007 - Coronary atherosclerotic heart disease with unstable angina due to       high-grade obstruction in the saphenous vein graft in the first  diagonal.       a.     Resolved after restenting of restenotic Cypher stent in the        proximal graft and new 99% disease in the mid graft.  Partial Small BoweL Obstruction 2009 History of Prostate CA  Low back pain - advanced spondylosis CT myelogram 08/2008 IBS   MD roster: Rolly Salter Sharl Ma ortho - beane/ramos card -  nishan/klein - smith gi - gessner uro - wrenn ent - bates  Past Surgical History: Reviewed history from 09/17/2009 and no changes required. Coronary artery bypass graft stents  bilateral cataracts  Pacemaker Placement BILATERAL INGUINAL HERNIA REPAIRS PENILE PROSTHESIS  Lumbar surgery 12/2008 COLONOSCOPY 2006-DIVERTICULOSIS,2 SMALL POLYPS NOT RETRIEVED  Family History: Reviewed history from 03/11/2009 and no changes required. Pt's parents died when he was 67 years old. No knowledge of family history.     Social History: Reviewed history from 04/18/2009 and no changes required. Alcohol use-no Tobacco-no quit 15 years ago Retired  lives alone, but has lady friend  Illicit Drug Use - no  Patient gets regular exercise.   Review of Systems       no fever  Vital Signs:  Patient profile:   77 year old male Height:      66 inches Weight:      239.25 pounds BMI:     38.76 Pulse rate:   64 / minute Pulse rhythm:   regular BP sitting:   110 / 62  (left arm) Cuff size:   large  Vitals Entered By: [name] CMA [name]) (January 30, 2010 1:47 PM)  Physical Exam  General:  obese  alert, well-developed, well-nourished, and cooperative to examination.   Eyes:  anicteric Lungs:  few crackles at bases Heart:  Regular rate and rhythm; no murmurs, rubs,  or bruits. Abdomen:  obese BS+ soft, tender diffusely without rebound or guarding no HSM  Neurologic:  Alert and  oriented x3 Psych:  Alert and cooperative. Normal mood and affect.   Impression & Recommendations:  Problem # 1:  IRRITABLE BOWEL SYNDROME (ICD-564.1) Assessment Deteriorated I think this is where his pain and bowel habit changes com from. Though bothersome the patterns are similar to past and he has had negative CT anmd endoscopic evaluations. A bit strange that pain occurs with walking. ? if he has some neuropathic issues. will try Augmentin for possible small bowel bacterial overgrowth to see if that helps.  Antibiotics have in past. We have not but may need regular, cyclic andtibiotics.  if this is unhelpful Augmentin) then may repeat CT scan abd/pelvis.  Problem # 2:  GERD (ICD-530.81) Assessment: Unchanged continue PPI  Patient Instructions: 1)  Please pick up your medications at your pharmacy. Augmentin 2)  Please call us in 2 weeks with an update.  Ask to speak with Dr. Marvell Fuller nurse. 3)  The medication list was reviewed and reconciled.  All changed / newly prescribed medications were explained.  A complete medication list was provided to the patient / caregiver. Prescriptions: AUGMENTIN 875-125 MG TABS (AMOXICILLIN-POT CLAVULANATE) 1 by mouth two times a day with food  #20 x 0   Entered and Authorized by:   Iva Boop MD, [facility]   Signed by:   Iva Boop  MD, Clementeen Graham on 01/30/2010   Method used:   Electronically to        [facility]  [address] (retail)       [address] [place] [address]       [place], [place]  [address]       Ph: [phone]       Fax: [phone]   RxID:   [id number]

## 2010-07-08 NOTE — Progress Notes (Signed)
Summary: Triage   Phone Note Call from Patient Call back at Home Phone [phone]   Caller: Patient Call For: Dr. Leone Payor Reason for Call: Talk to Nurse Summary of Call: pt has abd. pain/bloating and vomiting clear liquid. Has scar on small bowels--wants a sooner appt. than first avail. Initial call taken by: Karna Christmas,  September 16, 2009 10:33 AM  Follow-up for Phone Call        Patient c/o abdominal bloating, occasional vomiting, reflux since Friday.  States severe bloating after meals.  He is having small BM, advised to stay on a full liquid diet, and come for appointment with Mike Gip PA 09/17/09 10:00 Follow-up by: Darcey Nora RN, CGRN,  September 16, 2009 11:36 AM

## 2010-07-08 NOTE — Letter (Signed)
Summary: Device-Delinquent Check  [facility], Main Office  [address]   [place], [place] [address]   Phone: [phone]  Fax: [phone]     March 07, 2010 MRN: [id number]   Eric [name] [address] [place], [place]  [address]   Dear Mr. Cassaday,  According to our records, you have not had your implanted device checked in the recommended period of time.  We are unable to determine appropriate device function without checking your device on a regular basis.  Please call our office to schedule an appointment with Dr Graciela Husbands,  as soon as possible.  If you are having your device checked by another physician, please call us so that we may update our records.  Thank you,  Letta Moynahan, EMT  March 07, 2010 1:25 PM  [facility]

## 2010-07-08 NOTE — Progress Notes (Signed)
Summary: Referral  Phone Note Call from Patient Call back at Home Phone [phone]   Caller: Patient Summary of Call: Pt called requesting referral to Cardiology Dr Graciela Husbands to have Pacemaker checked. Pt states he recieved lettler from Cardio to sch appt but when he called he was told that he would need a referral from PCP. Initial call taken by: Margaret Pyle, CMA,  March 19, 2010 3:11 PM  Follow-up for Phone Call        i can certainly arrange referral (done) but that doesn't seem right - letter from cards in our emr (03/07/10) stating pt should call for f/u with cards... Follow-up by: Newt Lukes MD,  March 19, 2010 5:29 PM  Additional Follow-up for Phone Call Additional follow up Details #1::        I asked pt same at time of call and he stated they may need a referral because he told them that another Cardiologist at [facility] was checking his pacemaker, even though, I advised that they still sent him a letter to sch. Pt insists that when he did call to sch he was told that he needed a new referral from PCP Additional Follow-up by: Margaret Pyle, CMA,  March 20, 2010 8:07 AM

## 2010-07-08 NOTE — Assessment & Plan Note (Signed)
Summary: WENT TO HOSP IN [place] AFTER FALL/WENT TO ORTHOPEDIC ALSO/NWS   Vital Signs:  Patient profile:   77 year old male Height:      66 inches (167.64 cm) Weight:      254.4 pounds (115.64 kg) O2 Sat:      95 % on Room air Temp:     98.4 degrees F (36.89 degrees C) oral Pulse rate:   59 / minute BP sitting:   134 / 48  (left arm) Cuff size:   regular  Vitals Entered By: Orlan Leavens (September 03, 2009 1:37 PM)  O2 Flow:  Room air CC: Fell 3 weeks ago. still having (L) side rib pain, bruise (L) knee Is Patient Diabetic? Yes Did you bring your meter with you today? No Pain Assessment Patient in pain? yes     Location: (L) rib pain Type: dull/sometimes sharp   Primary Care Provider:  Newt Lukes MD  CC:  Larey Seat 3 weeks ago. still having (L) side rib pain and bruise (L) knee.  History of Present Illness: fall 3 weeks ago -  direct trauma to left chest wall - also left elbow, knee and "twisted" left ankle - no LOC - just accidental tripped on feet walking to car seen at [facility] 3/10 after fall - neg fx ribs, "hairline" fx elbow and sprain knee and ankle - all pain and bruising is improving except for left anterior rib area - has taken pain pills - also stomach medications for gas - nothing helping - was constipated but resolved s/p MOM bottle - pain was 9/10 this AM when made apt here, now unable to reproduce 0- brough xrays from [place] with him for me to review today-  other hx reviewed from prior OV:  DM2 - stopped Actos due to fluid retention and rash - no longer on metformin either now seeing endo, kerr - who stopped all meds because "blood and urine did not show any diabetes" checks sugars infreq now - only goes over 120 if eat something sweet   HTN -  reports compliance with ongoing medical treatment and no changes in medication dose or frequency. denies adverse side effects related to current therapy.   dyslipidemia - reports compliance with ongoing  medical treatment and no changes in medication dose or frequency. denies adverse side effects related to current therapy.    L great toe - still using salve and covering - mgmt as ongoing by podiatrist - improving overall  just has "tubes" of blood done at [facility] 2 weeks ago...  Current Medications (verified): 1)  Niacin Cr 1000 Mg  Tbcr (Niacin) .... Take 2 At Bedtime 2)  Ecotrin 325 Mg  Tbec (Aspirin) .... One By Mouth Once Daily 3)  Bumetanide 1 Mg  Tabs (Bumetanide) .... One By Mouth Once Daily 4)  Diovan 160 Mg  Tabs (Valsartan) .[name].. 1 By Mouth Two Times A Day 5)  Pravastatin Sodium 40 Mg Tabs (Pravastatin Sodium) .... Take 2 At Bedtime 6)  Plavix 75 Mg  Tabs (Clopidogrel Bisulfate) .... Take 1 Tablet By Mouth Once A Day 7)  Nexium 40 Mg  Cpdr (Esomeprazole Magnesium) .[name].. 1 Capsule Each Day 30 Minutes Before Meal 8)  Albuterol Sulfate (2.5 Mg/52ml) 0.083% Nebu (Albuterol Sulfate) .... 2.5 Mg Neb Three Times A Day As Needed 9)  Norvasc 5 Mg Tabs (Amlodipine Besylate) .... Take One Tabletqd 10)  B-12 500 Mcg Tabs (Cyanocobalamin) .... Take 1 Tablet By Mouth Two Times A Day 11)  Gas-X 80 Mg Chew (Simethicone) .... As Needed 12)  Isosorbide Mononitrate Cr 60 Mg Xr24h-Tab (Isosorbide Mononitrate) .... Take 1 By Mouth Qd 13)  Hydrocodone-Acetaminophen 5-500 Mg Tabs (Hydrocodone-Acetaminophen) .... Take 1-2 By Mouth Once Daily As Needed For Pain  Allergies (verified): 1)  ! * Actos  Past History:  Past Medical History: COPD  Coronary artery disease - ischemic cardiomyopathy EF 50% GERD Hyperlipidemia   Hypertension Peripheral vascular disease   Diverticulitis, hx of Asthma Diabetes mellitus, type II Symptomatic Bradycardia - S/P dual chamber pacemaker, St. Jude model 09/20/2007 PTCA for unstable angina 06/10/2007 - Coronary atherosclerotic heart disease with unstable angina due to       high-grade obstruction in the saphenous vein graft in the first  diagonal.       a.      Resolved after restenting of restenotic Cypher stent in the        proximal graft and new 99% disease in the mid graft.  Partial Small Bowell Obstruction  History of Prostate CA  Low back pain - advanced spondylosis CT myelogram 08/2008  MD rooster: endo Sharl Ma  Review of Systems       The patient complains of chest pain and abdominal pain.  The patient denies weight loss, syncope, and dyspnea on exertion.    Physical Exam  General:  overweight-appearing.  alert, well-developed, well-nourished, and cooperative to examination.   obvious bruising below both eyes Eyes:  vision grossly intact; pupils equal, round and reactive to light.  conjunctiva and lids normal.    Chest Wall:  tender to palp directly over rib, mid clavicular line below breast -  Lungs:  normal respiratory effort, no intercostal retractions or use of accessory muscles; normal breath sounds bilaterally - no crackles and no wheezes.    Heart:  normal rate, regular rhythm, no murmur, and no rub. BLE without edema. Abdomen:  soft, non-tender, normal bowel sounds, no distention; no masses and no appreciable hepatomegaly or splenomegaly.   Msk:  + left olecronon bursisits effussion without erythema or warmth; left knee and left ankle w/o effusion or brusion - all with FROM, no pain Neurologic:  alert & oriented X3 and cranial nerves II-XII symetrically intact.  strength normal in all extremities, sensation intact to light touch, and gait normal. speech fluent without dysarthria or aphasia; follows commands with good comprehension.    Impression & Recommendations:  Problem # 1:  RIB PAIN, LEFT SIDED (ICD-786.50) ?related to gas vs. stress fx -  xrays brought from [place] by pt and reviewed personally today - no obvious fx -  tender on exam - ?bruising vs bony injury -  lungs and heart exam normal, O2 good - cont  hydrocodone as needed for pain as ongoing - avoid constipation and add probiotic -  cont tx for "gas" as this  may be contrib to pain if cont pain next week, will arrange CT scan to exclude fx or other internal injury reassurance provided, Time spent with patient 25 minutes, more than 50% of this time was spent reviewing injury and xrays and tx.  Complete Medication List: 1)  Niacin Cr 1000 Mg Tbcr (Niacin) .... Take 2 at bedtime 2)  Ecotrin 325 Mg Tbec (Aspirin) .... One by mouth once daily 3)  Bumetanide 1 Mg Tabs (Bumetanide) .... One by mouth once daily 4)  Diovan 160 Mg Tabs (Valsartan) .[name].. 1 by mouth two times a day 5)  Pravastatin Sodium 40 Mg Tabs (Pravastatin sodium) .... Take 2  at bedtime 6)  Plavix 75 Mg Tabs (Clopidogrel bisulfate) .... Take 1 tablet by mouth once a day 7)  Nexium 40 Mg Cpdr (Esomeprazole magnesium) .[name].. 1 capsule each day 30 minutes before meal 8)  Albuterol Sulfate (2.5 Mg/67ml) 0.083% Nebu (Albuterol sulfate) .... 2.5 mg neb three times a day as needed 9)  Norvasc 5 Mg Tabs (Amlodipine besylate) .... Take one tabletqd 10)  B-12 500 Mcg Tabs (Cyanocobalamin) .... Take 1 tablet by mouth two times a day 11)  Gas-x 80 Mg Chew (Simethicone) .... As needed 12)  Isosorbide Mononitrate Cr 60 Mg Xr24h-tab (Isosorbide mononitrate) .... Take 1 by mouth qd 13)  Hydrocodone-acetaminophen 5-500 Mg Tabs (Hydrocodone-acetaminophen) .... Take 1-2 by mouth once daily as needed for pain  Patient Instructions: 1)  it was good to see you today.  2)  xrays reviewed with you today - no obvious fractures seen 3)  continue pain medication as you are doing + Align for your bowels + gas x as needed  4)  if continued pain next week, call and we will get a CT scan of your chest
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
## 2010-07-08 NOTE — Letter (Signed)
Summary: [facility]  [facility]   Imported By: Lester Seymour 11/08/2009 07:55:04  _____________________________________________________________________  External Attachment:    Type:   Image     Comment:   External Document

## 2010-07-08 NOTE — Letter (Signed)
Summary: *Referral Letter  [facility]  [address] [place], [place] [address]   Phone: [phone]  Fax: [phone]    02/19/2010 Jene Every, MD [facility]  Thank you in advance for agreeing to see our patient:  Eric [name] [address] [place], [place]  [address]  Phone: [phone]  Reason for Referral: Chronic abdominal pain of unclear etiology. I am questioning if there could be any neuropathic component related to his spine disease. I know this would be unusual but he says his pain gets worse with walking and his abd/pelvic CT shows significant degenerative disease of thoracolumbar spine. GI therapy has not helped. Some of this is IBS/GI but ? if all. Any help you can provide is appreciated and I am prepared to see him again.  Current Medical Problems: 1)  POSTURAL LIGHTHEADEDNESS (ICD-780.4) 2)  OBESITY (ICD-278.00) 3)  HYPERTENSION (ICD-401.9) 4)  HYPERLIPIDEMIA (ICD-272.4) 5)  COPD (ICD-496) 6)  DIABETES MELLITUS-TYPE II (ICD-250.00) 7)  GAIT DISTURBANCE (ICD-781.2) 8)  CORONARY ARTERY DISEASE (ICD-414.00) 9)  CARDIOMYOPATHY, ISCHEMIC (ICD-414.8) 10)  IRRITABLE BOWEL SYNDROME (ICD-564.1) 11)  INGROWN TOENAIL (ICD-703.0) 12)  LOW BACK PAIN, CHRONIC (ICD-724.2) 13)  GERD (ICD-530.81) 14)  SICK SINUS/ TACHY-BRADY SYNDROME (ICD-427.81) 15)  PACEMAKER, PERMANENT  ST JUDES (ICD-V45.01) 16)  CAROTID BRUIT, RIGHT (ICD-785.9) 17)  TOBACCO USE, QUIT (ICD-V15.82) 18)  ALLERGIC RHINITIS (ICD-477.9) 19)  PERSONAL HX PROSTATE CANCER (ICD-V10.46) 20)  DIVERTICULOSIS-COLON (ICD-562.10) 21)  PERSONAL HX COLONIC POLYPS (ICD-V12.72) 22)  Hx of SMALL BOWEL OBSTRUCTION (ICD-560.9) 23)  AORTIC SCLEROSIS (ICD-424.1) 24)  LACTOSE INTOLERANCE (ICD-271.3) 25)  PERIPHERAL VASCULAR DISEASE (ICD-443.9)   Current Medications: 1)  NIACIN CR 1000 MG  TBCR (NIACIN) take 2 at bedtime 2)  ECOTRIN 325 MG  TBEC (ASPIRIN) one by mouth once daily 3)  BUMETANIDE 1  MG  TABS (BUMETANIDE) one by mouth once daily 4)  DIOVAN 160 MG  TABS (VALSARTAN) 1 by mouth two times a day 5)  PRAVASTATIN SODIUM 40 MG TABS (PRAVASTATIN SODIUM) take 2 at bedtime 6)  PLAVIX 75 MG  TABS (CLOPIDOGREL BISULFATE) Take 1 tablet by mouth once a day 7)  NEXIUM 40 MG  CPDR (ESOMEPRAZOLE MAGNESIUM) 1 capsule each day 30 minutes before meal 8)  ALBUTEROL SULFATE (2.5 MG/3ML) 0.083% NEBU (ALBUTEROL SULFATE) 2.5 mg neb three times a day as needed 9)  NORVASC 5 MG TABS (AMLODIPINE BESYLATE) take one tabletqd 10)  B-12 500 MCG TABS (CYANOCOBALAMIN) Take 1 tablet by mouth two times a day 11)  GAS-X 80 MG CHEW (SIMETHICONE) as needed 12)  ISOSORBIDE MONONITRATE CR 60 MG XR24H-TAB (ISOSORBIDE MONONITRATE) take 1/2 tab by mouth once daily 13)  PROMETHAZINE HCL 25 MG TABS (PROMETHAZINE HCL) Take 1/2 tab every 6 hours as needed for nausea 14)  MYLANTA 200-200-20 MG/5ML SUSP (ALUM & MAG HYDROXIDE-SIMETH) as needed 15)  MAALOX PLUS 225-200-25 MG/5ML SUSP (ALUM & MAG HYDROXIDE-SIMETH) as needed 16)  SENOKOT 8.6 MG TABS (SENNOSIDES) three tablets by mouth at bedtime two times a week 17)  ALOPHEN 5 MG TBEC (BISACODYL) two tablets by mouth at bedtime five times a week 18)  MECLIZINE HCL 12.5 MG TABS (MECLIZINE HCL) 1-2 by mouth every 4 hours as needed for dizzy symptoms 19)  AUGMENTIN 875-125 MG TABS (AMOXICILLIN-POT CLAVULANATE) 1 by mouth two times a day with food   Past Medical History: 1)  COPD  2)  Coronary artery disease - ischemic cardiomyopathy EF 50% 3)  GERD 4)  Hyperlipidemia   5)  Hypertension 6)  Peripheral vascular disease  7)  Diverticulitis, hx of  8)  Asthma 9)  Diabetes mellitus, type II 10)  Symptomatic Bradycardia - S/P dual chamber pacemaker, St. Jude model 09/20/2007 11)  PTCA for unstable angina 06/10/2007 - Coronary atherosclerotic heart disease with unstable angina due to  12)       high-grade obstruction in the saphenous vein graft in the first  diagonal.  13)        a.     Resolved after restenting of restenotic Cypher stent in the  14)        proximal graft and new 99% disease in the mid graft.  15)  Partial Small BoweL Obstruction 2009 16)  History of Prostate CA  17)  Low back pain - advanced spondylosis CT myelogram 08/2008 18)  IBS 19)    20)  MD roster: 21)  endo Sharl Ma 22)  ortho - beane/ramos 23)  card - nishan/klein - smith 24)  gi - gessner 25)  uro - wrenn 26)  ent - bates  Thank you again for agreeing to see our patient; please contact us if you have any further questions or need additional information.  Sincerely, Ashley Murrain MD, [facility]

## 2010-07-08 NOTE — Assessment & Plan Note (Signed)
Summary: F/U Abd bloating, reflux, nausea ,saw PA    History of Present Illness Visit Type: Follow-up Visit Primary GI MD: Stan Head MD [facility] Primary Provider: Otho Najjar, MD Requesting Provider: n/a Chief Complaint: Bloating  History of Present Illness:   77 yo wm with long-standing IBS and ? of partial small bowel obstructions. He has been treated with antibiotics for gas/bloat intermittently x years. He was recently asessed for abdominal pain by Mike Gip, PA-C, in the setting of a recent fall and trauma. I am better but my stomach stays sore (upper) all the time. Periods of constipation have been occurring since he fell 9 weeks ago (turned ankle and fell). He was taking Vicodin for 3 weeks after the fall, but constipation did not improve after stopping. He is using Senokot and bisacodyl. he als tried some MiraLax samples. he is still recovering from fall.   GI Review of Systems    Reports acid reflux, bloating, and  nausea.      Denies abdominal pain, belching, chest pain, dysphagia with liquids, dysphagia with solids, heartburn, loss of appetite, vomiting, vomiting blood, weight loss, and  weight gain.        Denies anal fissure, black tarry stools, change in bowel habit, constipation, diarrhea, diverticulosis, fecal incontinence, heme positive stool, hemorrhoids, irritable bowel syndrome, jaundice, light color stool, liver problems, rectal bleeding, and  rectal pain.    Colonoscopy  Procedure date:  10/21/2004  Findings:      Diverticulosis and 2 diminutive polyps destroyed  Colonoscopy  Procedure date:  07/17/2003  Findings:      Hiatal hernia esophageal dilation for dysphagia 57 french  Dr. Blossom Hoops   Current Medications (verified): 1)  Niacin Cr 1000 Mg  Tbcr (Niacin) .... Take 2 At Bedtime 2)  Ecotrin 325 Mg  Tbec (Aspirin) .... One By Mouth Once Daily 3)  Bumetanide 1 Mg  Tabs (Bumetanide) .... One By Mouth Once Daily 4)  Diovan 160 Mg  Tabs  (Valsartan) .[name].. 1 By Mouth Two Times A Day 5)  Pravastatin Sodium 40 Mg Tabs (Pravastatin Sodium) .... Take 2 At Bedtime 6)  Plavix 75 Mg  Tabs (Clopidogrel Bisulfate) .... Take 1 Tablet By Mouth Once A Day 7)  Nexium 40 Mg  Cpdr (Esomeprazole Magnesium) .[name].. 1 Capsule Each Day 30 Minutes Before Meal 8)  Albuterol Sulfate (2.5 Mg/47ml) 0.083% Nebu (Albuterol Sulfate) .... 2.5 Mg Neb Three Times A Day As Needed 9)  Norvasc 5 Mg Tabs (Amlodipine Besylate) .... Take One Tabletqd 10)  B-12 500 Mcg Tabs (Cyanocobalamin) .... Take 1 Tablet By Mouth Two Times A Day 11)  Gas-X 80 Mg Chew (Simethicone) .... As Needed 12)  Isosorbide Mononitrate Cr 60 Mg Xr24h-Tab (Isosorbide Mononitrate) .... Take 1 By Mouth Qd 13)  Promethazine Hcl 25 Mg Tabs (Promethazine Hcl) .... Take 1/2 Tab Every 6 Hours As Needed For Nausea 14)  Mylanta 200-200-20 Mg/46ml Susp (Alum & Mag Hydroxide-Simeth) .... As Needed 15)  Maalox Plus 225-200-25 Mg/32ml Susp (Alum & Mag Hydroxide-Simeth) .... As Needed 16)  Senokot 8.6 Mg Tabs (Sennosides) .... Three Tablets By Mouth At Bedtime Two Times A Week 17)  Alophen 5 Mg Tbec (Bisacodyl) .... Two Tablets By Mouth At Bedtime Five Times A Week  Allergies (verified): 1)  ! * Actos  Past History:  Past Medical History: Reviewed history from 09/17/2009 and no changes required. COPD  Coronary artery disease - ischemic cardiomyopathy EF 50% GERD Hyperlipidemia   Hypertension Peripheral vascular disease  Diverticulitis, hx of Asthma Diabetes mellitus, type II Symptomatic Bradycardia - S/P dual chamber pacemaker, St. Jude model 09/20/2007 PTCA for unstable angina 06/10/2007 - Coronary atherosclerotic heart disease with unstable angina due to       high-grade obstruction in the saphenous vein graft in the first  diagonal.       a.     Resolved after restenting of restenotic Cypher stent in the        proximal graft and new 99% disease in the mid graft.  Partial Small BoweL Obstruction  2009 History of Prostate CA  Low back pain - advanced spondylosis CT myelogram 08/2008  MD rooster: endo Sharl Ma  Past Surgical History: Reviewed history from 09/17/2009 and no changes required. Coronary artery bypass graft stents  bilateral cataracts  Pacemaker Placement BILATERAL INGUINAL HERNIA REPAIRS PENILE PROSTHESIS  Lumbar surgery 12/2008 COLONOSCOPY 2006-DIVERTICULOSIS,2 SMALL POLYPS NOT RETRIEVED  Family History: Reviewed history from 03/11/2009 and no changes required. Pt's parents died when he was 59 years old. No knowledge of family history.     Social History: Reviewed history from 04/18/2009 and no changes required. Alcohol use-no Tobacco-no quit 15 years ago Retired  lives alone, but has lady friend  Illicit Drug Use - no  Patient gets regular exercise.   Review of Systems       ankle sore, chest wall sore  Vital Signs:  Patient profile:   77 year old male Height:      66 inches Weight:      250 pounds BMI:     40.50 BSA:     2.20 Pulse rate:   64 / minute Pulse rhythm:   regular BP sitting:   120 / 64  (left arm) Cuff size:   large  Vitals Entered By: Ok Anis CMA (Oct 23, 2009 1:28 PM)  Physical Exam  General:  Well developed, well nourished, no acute distress. Lungs:  Clear throughout to auscultation. Heart:  Regular rate and rhythm; no murmurs, rubs,  or bruits. Abdomen:  soft and non-tender   Impression & Recommendations:  Problem # 1:  ABDOMINAL PAIN, PERIUMBILICAL (ICD-789.05) Assessment Improved was epigastric, now periumbilical similar to what he has had in past, probably part of IBS/? sm bowel intestinal overgrowth doubt related to fall see what cipro and metronidazole does as antibiotics have helped in the past and ? of SIBO  Problem # 2:  ABDOMINAL TENDERNESS LEFT LOWER QUADRANT (UJW-119.14) Assessment: New he could have diverticuitis  given his reponse to antibiotics in the past will treat with cipro and metronidazole  as written below  Problem # 3:  IRRITABLE BOWEL SYNDROME (ICD-564.1) Assessment: Comment Only  Problem # 4:  PERSONAL HX COLONIC POLYPS (ICD-V12.72) Assessment: Unchanged two diminutive polyps destroyed (NO PATHOLOGY) in 2006 will review other colonoscopies re: any polyps he is 82 and seems not to be at increased risk of colon cancer from what we know and is not inclined to pursue surveillance/screening colonoscopy   Patient Instructions: 1)  Please pick up your medications at your pharmacy. 2)  Hopefully, after you take the ciprofloxacin and metronidazole, your bowel movements and abdomnal pain will improve. 3)  Please schedule a follow-up appointment in 1 month. Schedule this today. 4)  Call back sooner as needed 5)  The medication list was reviewed and reconciled.  All changed / newly prescribed medications were explained.  A complete medication list was provided to the patient / caregiver. Prescriptions: METRONIDAZOLE 500 MG TABS (METRONIDAZOLE) 1 by mouth  two times a day x 10 days  #20 x 0   Entered and Authorized by:   Iva Boop MD, [facility]   Signed by:   Iva Boop MD, FACG on 10/23/2009   Method used:   Electronically to        [facility]  [address] (retail)       [address] [place], [place]  [address]       Ph: [phone]       Fax: [phone]   RxID:   [id number] CIPRO 500 MG TAB (CIPROFLOXACIN HCL) Take 1 tablet by mouth morning and night X 10 days  #20 x 0   Entered and Authorized by:   Iva Boop MD, [facility]   Signed by:   Iva Boop MD, [facility] on 10/23/2009   Method used:   [facility] to        [facility]  [address] [facility]ret[facility]       [address]       [place], [place]  [address]       Ph: [phone]       Fax: [phone]   RxID:   [id number]

## 2010-07-08 NOTE — Assessment & Plan Note (Signed)
Summary: 3-4 MTH FU--STC   Vital Signs:  Patient profile:   77 year old male Height:      66 inches (167.64 cm) Weight:      240.12 pounds (109.15 kg) O2 Sat:      96 % on Room air Temp:     97.8 degrees F (36.56 degrees C) oral Pulse rate:   65 / minute BP sitting:   120 / 52  (left arm) Cuff size:   large  Vitals Entered By: Orlan Leavens RMA (February 21, 2010 10:47 AM)  O2 Flow:  Room air CC: 3 month follow-up Is Patient Diabetic? Yes Did you bring your meter with you today? No Pain Assessment Patient in pain? no        Primary Care Provider:  Otho Najjar, MD  CC:  3 month follow-up.  History of Present Illness: here for f/u  low back pain and DDD - a/w gait/balance do - 4 falls so far this year but none since starting shots  cont pain down right leg and hip has undergone injection to right hip with near 100% resolution of pain and walking/balance problems- would like to work with PT for balance -   DM2 - stopped Actos due to fluid retention and rash - no longer on metformin either now seeing endo, kerr - who stopped all meds because "blood and urine did not show any diabetes" checks sugars infreq now - only goes over 120 if eat something sweet   HTN -  reports compliance with ongoing medical treatment and no changes in medication dose or frequency. denies adverse side effects related to current therapy.   dyslipidemia - reports compliance with ongoing medical treatment and no changes in medication dose or frequency. denies adverse side effects related to current therapy.    Clinical Review Panels:  Immunizations   Last Tetanus Booster:  Td (08/07/2008)   Last Flu Vaccine:  Declined (03/11/2009)   Last Pneumovax:  Pneumovax (Medicare) (02/27/2008)  Lipid Management   Cholesterol:  103 (01/06/2010)   LDL (bad choesterol):  48 (01/06/2010)   HDL (good cholesterol):  41 (01/06/2010)   Triglycerides:  70 (01/06/2010)  Diabetes Management  HgBA1C:  7.2 (01/06/2010)   Creatinine:  1.1 (01/06/2010)   Last Foot Exam:  yes (06/12/2009)   Last Flu Vaccine:  Declined (03/11/2009)   Last Pneumovax:  Pneumovax (Medicare) (02/27/2008)  CBC   WBC:  5.1 (01/30/2010)   RBC:  3.67 (01/30/2010)   Hgb:  12.5 (01/30/2010)   Hct:  36.2 (01/30/2010)   Platelets:  152.0 (01/30/2010)   MCV  98.4 (01/30/2010)   MCHC  34.6 (01/30/2010)   RDW  13.8 (01/30/2010)   PMN:  77.2 (01/30/2010)   Lymphs:  10.4 (01/30/2010)   Monos:  10.8 (01/30/2010)   Eosinophils:  1.3 (01/30/2010)   Basophil:  0.3 (01/30/2010)  Complete Metabolic Panel   Glucose:  113 (01/06/2010)   Sodium:  140 (01/06/2010)   Potassium:  4.5 (01/06/2010)   Chloride:  100 (01/06/2010)   CO2:  30 (01/06/2010)   BUN:  14 (01/06/2010)   Creatinine:  1.1 (01/06/2010)   Albumin:  3.9 (07/01/2009)   Total Protein:  7.8 (07/01/2009)   Calcium:  10.2 (01/06/2010)   Total Bili:  0.3 (07/01/2009)   Alk Phos:  77 (07/01/2009)   SGPT (ALT):  29 (07/01/2009)   SGOT (AST):  20 (07/01/2009)   Current Medications (verified): 1)  Niacin Cr 1000 Mg  Tbcr (Niacin) .... Take 2 At  Bedtime 2)  Ecotrin 325 Mg  Tbec (Aspirin) .... One By Mouth Once Daily 3)  Bumetanide 1 Mg  Tabs (Bumetanide) .... One By Mouth Once Daily 4)  Diovan 160 Mg  Tabs (Valsartan) .[name].. 1 By Mouth Two Times A Day 5)  Pravastatin Sodium 40 Mg Tabs (Pravastatin Sodium) .... Take 2 At Bedtime 6)  Plavix 75 Mg  Tabs (Clopidogrel Bisulfate) .... Take 1 Tablet By Mouth Once A Day 7)  Nexium 40 Mg  Cpdr (Esomeprazole Magnesium) .[name].. 1 Capsule Each Day 30 Minutes Before Meal 8)  Albuterol Sulfate (2.5 Mg/80ml) 0.083% Nebu (Albuterol Sulfate) .... 2.5 Mg Neb Three Times A Day As Needed 9)  Norvasc 5 Mg Tabs (Amlodipine Besylate) .... Take One Tabletqd 10)  B-12 500 Mcg Tabs (Cyanocobalamin) .... Take 1 Tablet By Mouth Two Times A Day 11)  Gas-X 80 Mg Chew (Simethicone) .... As Needed 12)  Isosorbide Mononitrate Cr 60 Mg  Xr24h-Tab (Isosorbide Mononitrate) .... Take 1/2 Tab By Mouth Once Daily 13)  Promethazine Hcl 25 Mg Tabs (Promethazine Hcl) .... Take 1/2 Tab Every 6 Hours As Needed For Nausea 14)  Mylanta 200-200-20 Mg/24ml Susp (Alum & Mag Hydroxide-Simeth) .... As Needed 15)  Maalox Plus 225-200-25 Mg/76ml Susp (Alum & Mag Hydroxide-Simeth) .... As Needed 16)  Senokot 8.6 Mg Tabs (Sennosides) .... Three Tablets By Mouth At Bedtime Two Times A Week 17)  Alophen 5 Mg Tbec (Bisacodyl) .... Two Tablets By Mouth At Bedtime Five Times A Week 18)  Meclizine Hcl 12.5 Mg Tabs (Meclizine Hcl) .[name].. 1-2 By Mouth Every 4 Hours As Needed For Dizzy Symptoms 19)  Augmentin 875-125 Mg Tabs (Amoxicillin-Pot Clavulanate) .[name].. 1 By Mouth Two Times A Day With Food  Allergies (verified): 1)  ! * Actos 2)  ! Cipro 3)  ! * Metformin  Past History:  Past Medical History: COPD   Coronary artery disease - ischemic cardiomyopathy EF 50% GERD Hyperlipidemia   Hypertension Peripheral vascular disease  Diverticulitis, hx of  Asthma Diabetes mellitus, type II Symptomatic Bradycardia - S/P dual chamber pacemaker, St. Jude model 09/20/2007 PTCA for unstable angina 06/10/2007 - Coronary atherosclerotic heart disease with unstable angina due to       high-grade obstruction in the saphenous vein graft in the first  diagonal.       a.     Resolved after restenting of restenotic Cypher stent in the        proximal graft and new 99% disease in the mid graft.  Partial Small Bowel Obstruction 2009 History of Prostate CA  Low back pain - advanced spondylosis CT myelogram 08/2008 IBS    MD roster: Rolly Salter Sharl Ma ortho - beane/ramos card - nishan/klein - smith gi - gessner uro - wrenn ent - bates  Review of Systems  The patient denies anorexia, weight loss, chest pain, and syncope.    Physical Exam  General:  overweight-appearing.  alert, well-developed, well-nourished, and cooperative to examination.   Lungs:  normal  respiratory effort, no intercostal retractions or use of accessory muscles; normal breath sounds bilaterally - no crackles and no wheezes.    Heart:  normal rate, regular rhythm, no murmur, and no rub. BLE without edema.   Impression & Recommendations:  Problem # 1:  HYPERTENSION (ICD-401.9)  His updated medication list for this problem includes:    Bumetanide 1 Mg Tabs (Bumetanide) ..... One by mouth once daily    Diovan 160 Mg Tabs (Valsartan) .[name]... 1 by mouth two times a  day    Norvasc 5 Mg Tabs (Amlodipine besylate) .[name]... Take one tabletqd  BP today: 120/52 Prior BP: 110/62 (01/30/2010)  Labs Reviewed: K+: 4.5 (01/06/2010) Creat: : 1.1 (01/06/2010)   Chol: 103 (01/06/2010)   HDL: 41 (01/06/2010)   LDL: 48 (01/06/2010)   TG: 70 (01/06/2010)  Problem # 2:  HYPERLIPIDEMIA (ICD-272.4)  His updated medication list for this problem includes:    Niacin Cr 1000 Mg Tbcr (Niacin) .[name]... Take 2 at bedtime    Pravastatin Sodium 40 Mg Tabs (Pravastatin sodium) .[name]... Take 2 at bedtime  Labs Reviewed: SGOT: 20 (07/01/2009)   SGPT: 29 (07/01/2009)   HDL:41 (01/06/2010), 30 (07/01/2009)  LDL:48 (01/06/2010), 19 (07/01/2009)  Chol:103 (01/06/2010), 96 (07/01/2009)  Trig:70 (01/06/2010), 233 (07/01/2009)  Problem # 3:  DIABETES MELLITUS-TYPE II (ICD-250.00)  His updated medication list for this problem includes:    Ecotrin 325 Mg Tbec (Aspirin) ..... One by mouth once daily    Diovan 160 Mg Tabs (Valsartan) .[name]... 1 by mouth two times a day  endo kerr reviewed - mgmt per endo - diet controlled, now off actos and metformin since spring 2011  Labs Reviewed: Creat: 1.1 (01/06/2010)    Reviewed HgBA1c results: 7.2 (01/06/2010)  6.4 (07/01/2009)  Problem # 4:  GAIT DISTURBANCE (ICD-781.2)  falls reviewed - now feels improved since shots with [place] ortho in right hip (never had relief of same pain following back surg)  Complete Medication List: 1)  Niacin Cr 1000 Mg Tbcr (Niacin) .... Take 2  at bedtime 2)  Ecotrin 325 Mg Tbec (Aspirin) .... One by mouth once daily 3)  Bumetanide 1 Mg Tabs (Bumetanide) .... One by mouth once daily 4)  Diovan 160 Mg Tabs (Valsartan) .[name].. 1 by mouth two times a day 5)  Pravastatin Sodium 40 Mg Tabs (Pravastatin sodium) .... Take 2 at bedtime 6)  Plavix 75 Mg Tabs (Clopidogrel bisulfate) .... Take 1 tablet by mouth once a day 7)  Nexium 40 Mg Cpdr (Esomeprazole magnesium) .[name].. 1 capsule each day 30 minutes before meal 8)  Albuterol Sulfate (2.5 Mg/31ml) 0.083% Nebu (Albuterol sulfate) .... 2.5 mg neb three times a day as needed 9)  Norvasc 5 Mg Tabs (Amlodipine besylate) .... Take one tabletqd 10)  B-12 500 Mcg Tabs (Cyanocobalamin) .... Take 1 tablet by mouth two times a day 11)  Gas-x 80 Mg Chew (Simethicone) .... As needed 12)  Isosorbide Mononitrate Cr 60 Mg Xr24h-tab (Isosorbide mononitrate) .... Take 1/2 tab by mouth once daily 13)  Promethazine Hcl 25 Mg Tabs (Promethazine hcl) .... Take 1/2 tab every 6 hours as needed for nausea 14)  Mylanta 200-200-20 Mg/27ml Susp (Alum & mag hydroxide-simeth) .... As needed 15)  Maalox Plus 225-200-25 Mg/76ml Susp (Alum & mag hydroxide-simeth) .... As needed 16)  Senokot 8.6 Mg Tabs (Sennosides) .... Three tablets by mouth at bedtime two times a week 17)  Alophen 5 Mg Tbec (Bisacodyl) .... Two tablets by mouth at bedtime five times a week 18)  Meclizine Hcl 12.5 Mg Tabs (Meclizine hcl) .[name].. 1-2 by mouth every 4 hours as needed for dizzy symptoms  Patient Instructions: 1)   it was good to see you today.  2)  medications and interval history reviewed today - no changes recommended 3)  [facility] as ongoing - records reviewed as available 4)  Please schedule a follow-up appointment in 4 months , sooner if problems.

## 2010-07-08 NOTE — Assessment & Plan Note (Signed)
Summary: ov for medical clearance/#/cd   Vital Signs:  Patient profile:   77 year old male Height:      66 inches (167.64 cm) Weight:      244.0 pounds (110.91 kg) O2 Sat:      94 % on Room air Temp:     98.4 degrees F (36.89 degrees C) oral Pulse rate:   65 / minute BP sitting:   128 / 60  (left arm) Cuff size:   large  Vitals Entered By: Orlan Leavens RMA (April 17, 2010 11:14 AM)  O2 Flow:  Room air CC: Medical clearance Is Patient Diabetic? Yes Did you bring your meter with you today? No   Primary Care Provider:  Otho Najjar, MD pcp----Dr Diamond Nickel at [place]  CC:  Medical clearance.  History of Present Illness: here for medical clearance - ?planning THR right hip - (requested clearance from ortho - bean) reports 0/10 pain in right hip today -  no CP but feels "tired" in both shoulders and mild nausea - no vomitting, no diaphoresis no radiation of muscle symptoms - shoulders not painful with movement or activity no change in mild DOE - has not seen cards recently  chronic low back pain and DDD - a/w gait/balance do- hx falls early 2011but none since starting shots (ramos) has undergone injection to right hip with near 100% resolution of pain and walking/balance problems- would like to cont to work with PT for balance -   DM2 hx, diet controlled -  stopped Actos due to fluid retention and rash -no longer on metformin either now seeing endo, kerr - who stopped all meds because "blood and urine did not show any diabetes" checks sugars infreq now - only goes over 120 if eat something sweet   HTN -  reports compliance with ongoing medical treatment and no changes in medication dose or frequency. denies adverse side effects related to current therapy.   dyslipidemia - reports compliance with ongoing medical treatment and no changes in medication dose or frequency. denies adverse side effects related to current therapy.    Preventive Screening-Counseling &  Management  Alcohol-Tobacco     Smoking Status: quit  Clinical Review Panels:  Immunizations   Last Tetanus Booster:  Td (08/07/2008)   Last Flu Vaccine:  Declined (03/11/2009)   Last Pneumovax:  Pneumovax (Medicare) (02/27/2008)  Lipid Management   Cholesterol:  103 (01/06/2010)   LDL (bad choesterol):  48 (01/06/2010)   HDL (good cholesterol):  41 (01/06/2010)   Triglycerides:  70 (01/06/2010)  CBC   WBC:  5.1 (01/30/2010)   RBC:  3.67 (01/30/2010)   Hgb:  12.5 (01/30/2010)   Hct:  36.2 (01/30/2010)   Platelets:  152.0 (01/30/2010)   MCV  98.4 (01/30/2010)   MCHC  34.6 (01/30/2010)   RDW  13.8 (01/30/2010)   PMN:  77.2 (01/30/2010)   Lymphs:  10.4 (01/30/2010)   Monos:  10.8 (01/30/2010)   Eosinophils:  1.3 (01/30/2010)   Basophil:  0.3 (01/30/2010)  Complete Metabolic Panel   Glucose:  113 (01/06/2010)   Sodium:  140 (01/06/2010)   Potassium:  4.5 (01/06/2010)   Chloride:  100 (01/06/2010)   CO2:  30 (01/06/2010)   BUN:  14 (01/06/2010)   Creatinine:  1.1 (01/06/2010)   Albumin:  3.9 (07/01/2009)   Total Protein:  7.8 (07/01/2009)   Calcium:  10.2 (01/06/2010)   Total Bili:  0.3 (07/01/2009)   Alk Phos:  77 (07/01/2009)   SGPT (ALT):  29 (07/01/2009)   SGOT (AST):  20 (07/01/2009)   Current Medications (verified): 1)  Niacin Cr 1000 Mg  Tbcr (Niacin) .... Take 2 At Bedtime 2)  Ecotrin 325 Mg  Tbec (Aspirin) .... One By Mouth Once Daily 3)  Bumetanide 1 Mg  Tabs (Bumetanide) .... One By Mouth Once Daily 4)  Diovan 160 Mg  Tabs (Valsartan) .[name].. 1 By Mouth Two Times A Day 5)  Pravastatin Sodium 40 Mg Tabs (Pravastatin Sodium) .... Take 2 At Bedtime 6)  Plavix 75 Mg  Tabs (Clopidogrel Bisulfate) .... Take 1 Tablet By Mouth Once A Day 7)  Nexium 40 Mg  Cpdr (Esomeprazole Magnesium) .[name].. 1 Capsule Each Day 30 Minutes Before Meal 8)  Albuterol Sulfate (2.5 Mg/68ml) 0.083% Nebu (Albuterol Sulfate) .... 2.5 Mg Neb Three Times A Day As Needed 9)  Norvasc 5 Mg Tabs  (Amlodipine Besylate) .... Take One Tabletqd 10)  B-12 500 Mcg Tabs (Cyanocobalamin) .... Take 1 Tablet By Mouth Two Times A Day 11)  Gas-X 80 Mg Chew (Simethicone) .... As Needed 12)  Isosorbide Mononitrate Cr 60 Mg Xr24h-Tab (Isosorbide Mononitrate) .... Take1 and 1/2 Tab By Mouth Once Daily 13)  Promethazine Hcl 25 Mg Tabs (Promethazine Hcl) .... Take 1/2 Tab Every 6 Hours As Needed For Nausea 14)  Mylanta 200-200-20 Mg/39ml Susp (Alum & Mag Hydroxide-Simeth) .... As Needed 15)  Maalox Plus 225-200-25 Mg/45ml Susp (Alum & Mag Hydroxide-Simeth) .... As Needed 16)  Senokot 8.6 Mg Tabs (Sennosides) .... Three Tablets By Mouth At Bedtime Two Times A Week  Allergies (verified): 1)  ! * Actos 2)  ! Cipro 3)  ! * Metformin  Past History:  Past medical, surgical, family and social histories (including risk factors) reviewed, and no changes noted (except as noted below).  Past Medical History: COPD   Coronary artery disease - ischemic cardiomyopathy EF 50% GERD Hyperlipidemia   Hypertension Peripheral vascular disease  Diverticulitis, hx of  Asthma Diabetes mellitus, type II, diet controlled Symptomatic Bradycardia - S/P dual chamber pacemaker, St. Jude model 09/20/2007 PTCA for unstable angina 06/10/2007 - Coronary atherosclerotic heart disease with unstable angina due to       high-grade obstruction in the saphenous vein graft in the first  diagonal.       a.     Resolved after restenting of restenotic Cypher stent in the        proximal graft and new 99% disease in the mid graft.  Partial Small Bowel Obstruction 2009 History of Prostate CA  Low back pain - advanced spondylosis CT myelogram 08/2008 IBS    MD roster: Rolly Salter Sh[facility] ortho - bean/ramos card - nishan/klein - smith gi - gessner uro - wrenn ent - bates  Past Surgical History: Reviewed history from 09/17/2009 and no changes required. Coronary artery bypass graft stents  bilateral cataracts  Pacemaker  Placement BILATERAL INGUINAL HERNIA REPAIRS PENILE PROSTHESIS  Lumbar surgery 12/2008 COLONOSCOPY 2006-DIVERTICULOSIS,2 SMALL POLYPS NOT RETRIEVED  Family History: Reviewed history from 03/11/2009 and no changes required. Pt's parents died when he was 41 years old. No knowledge of family history.     Social History: Reviewed history from 04/18/2009 and no changes required. Alcohol use-no Tobacco-no quit 15 years ago Retired   lives alone, but has lady friend Illicit Drug Use - no  Patient gets regular exercise.   Review of Systems       see HPI above. I have reviewed all other systems and they were negative.   Physical  Exam  General:  overweight-appearing.  alert, well-developed, well-nourished, and cooperative to examination.   Lungs:  normal respiratory effort, no intercostal retractions or use of accessory muscles; normal breath sounds bilaterally - no crackles and no wheezes.    Heart:  normal rate, regular rhythm, no murmur, and no rub. BLE without edema. Msk:  No deformity or scoliosis noted of thoracic or lumbar spine.  right hip: decreased internal rotation but no groin pain present and no pain with abduction. normal gait   Impression & Recommendations:  Problem # 1:  PREOPERATIVE EXAMINATION (ICD-V72.84) requested by ortho - dr. bean for THR - ob exam at this time, no indication for THR - no pain or limitation of activity - rec against surg for pt unless decline in function however, if undergoes decline and needs surg, we proceeded with evaluation -  given CAD, EKG done - no acute issues but rec further clearance from cardiology (dr. Katrinka Blazing) as well - if ok by cards, it is felt that the patient's surgical risk would be low and outweighed by the potential benefit of the surgery.  Therefore, medically clear to proceed if severe pain cannot be otherwise controlled - will fax this note to dr. Tama Gander Orders: EKG w/ Interpretation (93000) Cardiology Referral  (Cardiology)  Problem # 2:  CORONARY ARTERY DISEASE (ICD-414.00) nonspecific shoulder "fatigue" - ekg without acute change today- rec f/u and clearance by cards prior to any invasive procedure meds reviewed - cont same His updated medication list for this problem includes:    Ecotrin 325 Mg Tbec (Aspirin) ..... One by mouth once daily    Bumetanide 1 Mg Tabs (Bumetanide) ..... One by mouth once daily    Diovan 160 Mg Tabs (Valsartan) .[name]... 1 by mouth two times a day    Plavix 75 Mg Tabs (Clopidogrel bisulfate) .[name]... Take 1 tablet by mouth once a day    Norvasc 5 Mg Tabs (Amlodipine besylate) .[name]... Take one tabletqd    Isosorbide Mononitrate Cr 60 Mg Xr24h-tab (Isosorbide mononitrate) .[name]... Take1 and 1/2 tab by mouth once daily  Orders: EKG w/ Interpretation (93000) Cardiology Referral (Cardiology)  Labs Reviewed: Chol: 103 (01/06/2010)   HDL: 41 (01/06/2010)   LDL: 48 (01/06/2010)   TG: 70 (01/06/2010)  Problem # 3:  HYPERTENSION (ICD-401.9)  His updated medication list for this problem includes:    Bumetanide 1 Mg Tabs (Bumetanide) ..... One by mouth once daily    Diovan 160 Mg Tabs (Valsartan) .[name]... 1 by mouth two times a day    Norvasc 5 Mg Tabs (Amlodipine besylate) .[name]... Take one tabletqd  Orders: EKG w/ Interpretation (93000) Cardiology Referral (Cardiology)  BP today: 128/60 Prior BP: 113/57 (03/26/2010)  Labs Reviewed: K+: 4.5 (01/06/2010) Creat: : 1.1 (01/06/2010)   Chol: 103 (01/06/2010)   HDL: 41 (01/06/2010)   LDL: 48 (01/06/2010)   TG: 70 (01/06/2010)  Problem # 4:  GAIT DISTURBANCE (ICD-781.2)  fall hx reviewed - now feels improved since shots with [place] ortho in right hip (never had relief of same pain following back surg) as above, would delay THR at this time  Complete Medication List: 1)  Niacin Cr 1000 Mg Tbcr (Niacin) .... Take 2 at bedtime 2)  Ecotrin 325 Mg Tbec (Aspirin) .... One by mouth once daily 3)  Bumetanide 1 Mg Tabs (Bumetanide) .... One by  mouth once daily 4)  Diovan 160 Mg Tabs (Valsartan) .[name].. 1 by mouth two times a day 5)  Pravastatin Sodium 40 Mg Tabs (Pravastatin  sodium) .... Take 2 at bedtime 6)  Plavix 75 Mg Tabs (Clopidogrel bisulfate) .... Take 1 tablet by mouth once a day 7)  Nexium 40 Mg Cpdr (Esomeprazole magnesium) .[name].. 1 capsule each day 30 minutes before meal 8)  Albuterol Sulfate (2.5 Mg/61ml) 0.083% Nebu (Albuterol sulfate) .... 2.5 mg neb three times a day as needed 9)  Norvasc 5 Mg Tabs (Amlodipine besylate) .... Take one tabletqd 10)  B-12 500 Mcg Tabs (Cyanocobalamin) .... Take 1 tablet by mouth two times a day 11)  Gas-x 80 Mg Chew (Simethicone) .... As needed 12)  Isosorbide Mononitrate Cr 60 Mg Xr24h-tab (Isosorbide mononitrate) .... Take1 and 1/2 tab by mouth once daily 13)  Promethazine Hcl 25 Mg Tabs (Promethazine hcl) .... Take 1/2 tab every 6 hours as needed for nausea 14)  Mylanta 200-200-20 Mg/39ml Susp (Alum & mag hydroxide-simeth) .... As needed 15)  Maalox Plus 225-200-25 Mg/47ml Susp (Alum & mag hydroxide-simeth) .... As needed 16)  Senokot 8.6 Mg Tabs (Sennosides) .... Three tablets by mouth at bedtime two times a week  Patient Instructions: 1)  it was good to see you today. 2)  exam and EKG today -  3)  delay surgery until further evaluation and clearance by Dr. Katrinka Blazing for your heart. we'll make appointment with Dr. Katrinka Blazing for you and our office will contact you regarding this appointment once made.  4)  Also, I would not recommend doing surgery unless you are having severe pain and trouble walking or moving - right now, you have no pain or limitations to suggest a need for hip replacement 5)  medications and interval history reviewed today - no changes recommended 6)  [facility] as ongoing - 7)  Please keep scheduled follow-up appointment as previously planned (every 4 months), call sooner if problems.    Orders Added: 1)  Est. Patient Level IV  [30865] 2)  EKG w/ Interpretation [93000] 3)  Cardiology Referral [Cardiology]

## 2010-07-08 NOTE — Progress Notes (Signed)
Summary: TRIAGE   Phone Note Call from Patient Call back at Home Phone [phone]   Caller: Patient Call For: Dr Leone Payor Reason for Call: Talk to Nurse Summary of Call: Patient wants to speak to nurse states that he has really dark stools since Saturday and swelling and pain in his stomach. He also states that he has lost a lot of wt. Initial call taken by: Tawni Levy,  January 29, 2010 2:46 PM  Follow-up for Phone Call        Pt. c/o soft,very dark green stools since Friday. Also intermittent epigastric pain.  Some fatigue. Denies n/v, fever, BRB. Takes Nexium two times a day.   1) See Dr.Makeda Peeks on 01-30-10 at 3:15pm. Please stop in the lab for a CBC prior. 2) If symptoms become worse call back immediately or go to ER.  Follow-up by: Laureen Ochs LPN,  January 29, 2010 3:06 PM

## 2010-07-08 NOTE — Progress Notes (Signed)
Summary: Condition UIpdate/CT Scheduled   Phone Note Call from Patient Call back at Home Phone [phone]   Caller: Patient Call For: Dr Leone Payor Reason for Call: Talk to Nurse Summary of Call: Patient wants to let Dr Leone Payor know that his stomach is not better after taking meds given to him two weeks ago. Initial call taken by: Tawni Levy,  February 12, 2010 11:19 AM  Follow-up for Phone Call        Last seen 01-30-10, was given Augmentin for 10 days.  Pt. continues with abd. pain, more in the periumbilical area.  Pain is worse after he wakes up and startes to move around, states, "My stomache stays sore all the time. I still got the same problem as before I took the antibiotics."  Denies fever, n/v, constipation, diarrhea, blood, black stools.  DR.Malacai Grantz PLEASE ADVISE  Follow-up by: Laureen Ochs LPN,  February 12, 2010 12:04 PM  Additional Follow-up for Phone Call Additional follow up Details #1::        CT abd/pelvis with contrast re: abdominal pain in a man with prior SBO  Additional Follow-up by: Iva Boop MD, Clementeen Graham,  February 12, 2010 3:46 PM    Additional Follow-up for Phone Call Additional follow up Details #2::    Pt. is scheduled for a CT at [facility] on 02-13-10 at 1:30pm. Instructions reviewed w/pt. by phone and he will be here by 11am tomorrow to get his contrast and written instructions. Pt. instructed to call back as needed.  Follow-up by: Laureen Ochs LPN,  February 12, 2010 4:05 PM

## 2010-07-08 NOTE — Progress Notes (Signed)
Summary: ROI with [facility]  Phone Note Call from Patient Call back at Home Phone [phone]   Caller: Patient Summary of Call: Pt called (01/27/2010) to inform MD that he has began to see an new MD at the [facility] in [facility] and per the [place] our office will need to [place] Dr Paula Compton [phone]. I called pt to clarify request and after several unsuccessful attempts to explain to the patient that he would need to sign a release of information form with [facility] allowing them to send notes to [facility] or with [place] requesting notes from the [place], I decided to call MD at the [facility] directly. I called Dr Lorre Nick office directly and left a few messages. The MD's office manager called back stating that the pt should have signed a release of information form with [facility] that would allow them to send notes to [facility]. Print production planner also stated that a call from [facility] for notes would not be authorized without this signed long-term ROI. He then transferred me to [phone] to find out if this had been sgned by pt. I left 2 messages on this extentions as well with no response.  Pt has upcoming appt with [facility] 02/20/2010 Initial call taken by: Margaret Pyle, CMA,  January 30, 2010 10:48 AM

## 2010-07-08 NOTE — Assessment & Plan Note (Signed)
Summary: dizzines --stc   Vital Signs:  Patient profile:   77 year old male Height:      66 inches (167.64 cm) Weight:      236.6 pounds (107.55 kg) O2 Sat:      94 % on Room air Temp:     97.8 degrees F (36.56 degrees C) oral Pulse rate:   66 / minute BP sitting:   124 / 60  (left arm) Cuff size:   large  Vitals Entered By: Orlan Leavens RMA (December 30, 2009 2:26 PM)  O2 Flow:  Room air CC: Diizziness Is Patient Diabetic? Yes Did you bring your meter with you today? No Pain Assessment Patient in pain? no      D  Primary Care Provider:  Otho Najjar, MD  CC:  Diizziness.  History of Present Illness: c/o dizziness - onset 08/2009 after 1st fall (head ct in [place] er reportly neg per pt recall) - symptoms occur nearly every day - worst in AM, not immediately upon waking but 1 hour after up - symptoms worst with position change from sitting to standing and fade over 3-5 min time - no symptoms dizziness turning in bed symptoms improved with slow movement to adjust for position change and not bending forawrd - no ear pain or drainage - no syncope or palp or CP - worried about "inner ear" and requests "dizzy pill" to treat same, prev rx'd by ENT - (?meclizine)  continued falls and balance issues- 4 falls so far this year - attributes fall and balance problems to pain in right hip - and back has undergone injection to right hip with near 100% resolution of pain and walking/balance problems- would like to work with PT for balance -   DM2 - stopped Actos due to fluid retention and rash - no longer on metformin either now seeing endo, kerr - who stopped all meds because "blood and urine did not show any diabetes" checks sugars infreq now - only goes over 120 if eat something sweet   HTN -  reports compliance with ongoing medical treatment and no changes in medication dose or frequency. denies adverse side effects related to current therapy.   dyslipidemia - reports  compliance with ongoing medical treatment and no changes in medication dose or frequency. denies adverse side effects related to current therapy.    Clinical Review Panels:  Lipid Management   Cholesterol:  96 (07/01/2009)   LDL (bad choesterol):  19 (07/01/2009)   HDL (good cholesterol):  30 (07/01/2009)   Triglycerides:  233 (07/01/2009)  Diabetes Management   HgBA1C:  6.4 (07/01/2009)   Creatinine:  0.9 (09/17/2009)   Last Foot Exam:  yes (06/12/2009)   Last Flu Vaccine:  Declined (03/11/2009)   Last Pneumovax:  Pneumovax (Medicare) (02/27/2008)  CBC   WBC:  4.3 (09/17/2009)   RBC:  3.95 (09/17/2009)   Hgb:  13.1 (09/17/2009)   Hct:  37.7 (09/17/2009)   Platelets:  170.0 (09/17/2009)   MCV  95.6 (09/17/2009)   MCHC  34.8 (09/17/2009)   RDW  12.9 (09/17/2009)   PMN:  71.1 (09/17/2009)   Lymphs:  12.7 (09/17/2009)   Monos:  11.7 (09/17/2009)   Eosinophils:  4.3 (09/17/2009)   Basophil:  0.2 (09/17/2009)  Complete Metabolic Panel   Glucose:  116 (09/17/2009)   Sodium:  142 (09/17/2009)   Potassium:  5.1 (09/17/2009)   Chloride:  108 (09/17/2009)   CO2:  29 (09/17/2009)   BUN:  13 (09/17/2009)  Creatinine:  0.9 (09/17/2009)   Albumin:  3.9 (07/01/2009)   Total Protein:  7.8 (07/01/2009)   Calcium:  10.3 (09/17/2009)   Total Bili:  0.3 (07/01/2009)   Alk Phos:  77 (07/01/2009)   SGPT (ALT):  29 (07/01/2009)   SGOT (AST):  20 (07/01/2009)   Current Medications (verified): 1)  Niacin Cr 1000 Mg  Tbcr (Niacin) .... Take 2 At Bedtime 2)  Ecotrin 325 Mg  Tbec (Aspirin) .... One By Mouth Once Daily 3)  Bumetanide 1 Mg  Tabs (Bumetanide) .... One By Mouth Once Daily 4)  Diovan 160 Mg  Tabs (Valsartan) .[name].. 1 By Mouth Two Times A Day 5)  Pravastatin Sodium 40 Mg Tabs (Pravastatin Sodium) .... Take 2 At Bedtime 6)  Plavix 75 Mg  Tabs (Clopidogrel Bisulfate) .... Take 1 Tablet By Mouth Once A Day 7)  Nexium 40 Mg  Cpdr (Esomeprazole Magnesium) .[name].. 1 Capsule Each Day 30  Minutes Before Meal 8)  Albuterol Sulfate (2.5 Mg/41ml) 0.083% Nebu (Albuterol Sulfate) .... 2.5 Mg Neb Three Times A Day As Needed 9)  Norvasc 5 Mg Tabs (Amlodipine Besylate) .... Take One Tabletqd 10)  B-12 500 Mcg Tabs (Cyanocobalamin) .... Take 1 Tablet By Mouth Two Times A Day 11)  Gas-X 80 Mg Chew (Simethicone) .... As Needed 12)  Isosorbide Mononitrate Cr 60 Mg Xr24h-Tab (Isosorbide Mononitrate) .... Take 1 By Mouth Qd 13)  Promethazine Hcl 25 Mg Tabs (Promethazine Hcl) .... Take 1/2 Tab Every 6 Hours As Needed For Nausea 14)  Mylanta 200-200-20 Mg/91ml Susp (Alum & Mag Hydroxide-Simeth) .... As Needed 15)  Maalox Plus 225-200-25 Mg/23ml Susp (Alum & Mag Hydroxide-Simeth) .... As Needed 16)  Senokot 8.6 Mg Tabs (Sennosides) .... Three Tablets By Mouth At Bedtime Two Times A Week 17)  Alophen 5 Mg Tbec (Bisacodyl) .... Two Tablets By Mouth At Bedtime Five Times A Week  Allergies (verified): 1)  ! * Actos 2)  ! Cipro  Past History:  Past Medical History: COPD  Coronary artery disease - ischemic cardiomyopathy EF 50% GERD Hyperlipidemia   Hypertension Peripheral vascular disease  Diverticulitis, hx of  Asthma Diabetes mellitus, type II Symptomatic Bradycardia - S/P dual chamber pacemaker, St. Jude model 09/20/2007 PTCA for unstable angina 06/10/2007 - Coronary atherosclerotic heart disease with unstable angina due to       high-grade obstruction in the saphenous vein graft in the first  diagonal.       a.     Resolved after restenting of restenotic Cypher stent in the        proximal graft and new 99% disease in the mid graft.  Partial Small BoweL Obstruction 2009 History of Prostate CA  Low back pain - advanced spondylosis CT myelogram 08/2008   MD roster: Rolly Salter Sharl Ma ortho - beane/ramos card - nishan/klein - smith gi - gessner uro - wrenn ent - bates  Review of Systems  The patient denies weight loss, vision loss, chest pain, syncope, dyspnea on exertion, and  headaches.    Physical Exam  General:  overweight-appearing.  alert, well-developed, well-nourished, and cooperative to examination.   Eyes:  no nystagmus Ears:  hearing aide in left side - once removed - normal pinnae bilaterally, without erythema, swelling, or tenderness to palpation. TMs clear, without effusion, or cerumen impaction. Hearing diminished bilaterally  Mouth:  pharynx pink and moist and no exudates.   Lungs:  normal respiratory effort, no intercostal retractions or use of accessory muscles; normal breath sounds bilaterally -  no crackles and no wheezes.    Heart:  normal rate, regular rhythm, no murmur, and no rub. BLE without edema. Neurologic:  alert & oriented X3 and cranial nerves II-XII symetrically intact.  strength normal in all extremities, sensation intact to light touch, and gait normal. speech fluent without dysarthria or aphasia; follows commands with good comprehension.  Psych:  Oriented X3, memory intact for recent and remote, normally interactive, good eye contact, not anxious appearing, not depressed appearing, and not agitated.      Impression & Recommendations:  Problem # 1:  POSTURAL LIGHTHEADEDNESS (ICD-780.4)  orthostatic symptoms by hx - no abn findings on exam - no palpitations or hypoglycemic events reduce imdur dose as pt concerned he had same symptoms dizziness on this med in past - also trial meclizine - erx done neuro exam bening and HD stable - reassurance provided His updated medication list for this problem includes:    Promethazine Hcl 25 Mg Tabs (Promethazine hcl) .[name]... Take 1/2 tab every 6 hours as needed for nausea    Meclizine Hcl 12.5 Mg Tabs (Meclizine hcl) .[name]... 1-2 by mouth every 4 hours as needed for dizzy symptoms  Orders: Prescription Created Electronically [phone])  Problem # 2:  CAROTID BRUIT, RIGHT (ICD-785.9)  follows with annual carotid dopple each Nov - will arrange if his cardioogist does not - reassurance  provided  Complete Medication List: 1)  Niacin Cr 1000 Mg Tbcr (Niacin) .... Take 2 at bedtime 2)  Ecotrin 325 Mg Tbec (Aspirin) .... One by mouth once daily 3)  Bumetanide 1 Mg Tabs (Bumetanide) .... One by mouth once daily 4)  Diovan 160 Mg Tabs (Valsartan) .[name].. 1 by mouth two times a day 5)  Pravastatin Sodium 40 Mg Tabs (Pravastatin sodium) .... Take 2 at bedtime 6)  Plavix 75 Mg Tabs (Clopidogrel bisulfate) .... Take 1 tablet by mouth once a day 7)  Nexium 40 Mg Cpdr (Esomeprazole magnesium) .[name].. 1 capsule each day 30 minutes before meal 8)  Albuterol Sulfate (2.5 Mg/39ml) 0.083% Nebu (Albuterol sulfate) .... 2.5 mg neb three times a day as needed 9)  Norvasc 5 Mg Tabs (Amlodipine besylate) .... Take one tabletqd 10)  B-12 500 Mcg Tabs (Cyanocobalamin) .... Take 1 tablet by mouth two times a day 11)  Gas-x 80 Mg Chew (Simethicone) .... As needed 12)  Isosorbide Mononitrate Cr 60 Mg Xr24h-tab (Isosorbide mononitrate) .... Take 1/2 tab by mouth once daily 13)  Promethazine Hcl 25 Mg Tabs (Promethazine hcl) .... Take 1/2 tab every 6 hours as needed for nausea 14)  Mylanta 200-200-20 Mg/17ml Susp (Alum & mag hydroxide-simeth) .... As needed 15)  Maalox Plus 225-200-25 Mg/36ml Susp (Alum & mag hydroxide-simeth) .... As needed 16)  Senokot 8.6 Mg Tabs (Sennosides) .... Three tablets by mouth at bedtime two times a week 17)  Alophen 5 Mg Tbec (Bisacodyl) .... Two tablets by mouth at bedtime five times a week 18)  Meclizine Hcl 12.5 Mg Tabs (Meclizine hcl) .[name].. 1-2 by mouth every 4 hours as needed for dizzy symptoms  Patient Instructions: 1)  it was good to see you today. 2)  reduce isosorbide to 1/2 tab  every morning  - 3)  if this does not help dizzy symptoms, try meclizine as discussed - your prescription has [facility]. Please take as directed. Contact our office if you believe you're having problems with the medication(s).  4)  drink extra water  and/or gatorade drinks in this heat to  stay hydrated! 5)  Please schedule a follow-up appointment as needed (or as previously scheduled) Prescriptions: MECLIZINE HCL 12.5 MG TABS (MECLIZINE HCL) 1-2 by mouth every 4 hours as needed for dizzy symptoms  #40 x 1   Entered and Authorized by:   Newt Lukes MD   Signed by:   Newt Lukes MD on 12/30/2009   Method used:   [facility] to        [facility]  [address]       [address]       [place], [place]  [address]       Ph: [phone]       Fax: [phone]   RxID:   [id number]

## 2010-07-10 ENCOUNTER — Other Ambulatory Visit: Payer: Self-pay | Admitting: Internal Medicine

## 2010-07-10 ENCOUNTER — Ambulatory Visit (INDEPENDENT_AMBULATORY_CARE_PROVIDER_SITE_OTHER)
Admission: RE | Admit: 2010-07-10 | Discharge: 2010-07-10 | Disposition: A | Discharge status: Home / Self Care | Payer: Medicare Other | Source: Ambulatory Visit | Attending: Internal Medicine | Admitting: Internal Medicine

## 2010-07-10 ENCOUNTER — Telehealth: Payer: Self-pay | Admitting: Internal Medicine

## 2010-07-10 ENCOUNTER — Ambulatory Visit (INDEPENDENT_AMBULATORY_CARE_PROVIDER_SITE_OTHER): Payer: Medicare Other | Admitting: Internal Medicine

## 2010-07-10 ENCOUNTER — Encounter: Payer: Self-pay | Admitting: Internal Medicine

## 2010-07-10 DIAGNOSIS — R140 Abdominal distension (gaseous): Secondary | ICD-10-CM

## 2010-07-10 DIAGNOSIS — R141 Gas pain: Secondary | ICD-10-CM

## 2010-07-10 DIAGNOSIS — K449 Diaphragmatic hernia without obstruction or gangrene: Secondary | ICD-10-CM | POA: Insufficient documentation

## 2010-07-10 DIAGNOSIS — K589 Irritable bowel syndrome without diarrhea: Secondary | ICD-10-CM

## 2010-07-10 DIAGNOSIS — R142 Eructation: Secondary | ICD-10-CM | POA: Insufficient documentation

## 2010-07-10 DIAGNOSIS — R143 Flatulence: Secondary | ICD-10-CM

## 2010-07-10 NOTE — Progress Notes (Signed)
Summary: Abd pain x2wks   Phone Note Call from Patient Call back at Home Phone [phone] Call back at [phone] cell   Call For: Dr Leone Payor Summary of Call: Abd Pain x2wks unable to eat anything but jello. Would like to speak to nurse, needs help. Initial call taken by: Leanor Kail [facility],  June 30, 2010 3:15 PM  Follow-up for Phone Call        REV scheduled for 07/10/10 2:00.  patient was offered an appointment for tomorrow, but he declined.   Follow-up by: Darcey Nora RN, CGRN,  June 30, 2010 4:29 PM

## 2010-07-10 NOTE — Assessment & Plan Note (Signed)
Summary: 4 MTH FU---STC   Vital Signs:  Patient profile:   77 year old male Height:      68 inches (172.72 cm) Weight:      250.0 pounds (113.64 kg) O2 Sat:      90 % on Room air Temp:     98.6 degrees F (37.00 degrees C) oral Pulse rate:   71 / minute BP sitting:   118 / 60  (left arm) Cuff size:   large  Vitals Entered By: Orlan Leavens RMA (June 26, 2010 11:00 AM)  O2 Flow:  Room air CC: 4 month follow-up Is Patient Diabetic? Yes Did you bring your meter with you today? No Pain Assessment Patient in pain? no      Comments Pt request samples of nexium   Primary Care Provider:  Otho Najjar, MD pcp----Dr Diamond Nickel at [facility]  CC:  4 month follow-up.  History of Present Illness: here for f/u  chronic low back pain and DDD - a/w gait/balance do- hx falls early 2011but none since starting shots (ramos) has undergone injection to right hip with near 100% resolution of pain and walking/balance problems- would like to cont to work with PT for balance - ?R THR if cleared by cards -- pt not sure he wants surg  DM2 hx, diet controlled -  stopped Actos due to fluid retention and rash- now seeing endo, kerr - who stopped all meds including metformin checks sugars infreq now - only goes over 120 if eat something sweet   HTN -  reports compliance with ongoing medical treatment and no changes in medication dose or frequency. denies adverse side effects related to current therapy.   dyslipidemia - reports compliance with ongoing medical treatment and no changes in medication dose or frequency. denies adverse side effects related to current therapy.   GERD - reports compliance with ongoing medical treatment and no changes in medication dose or frequency. denies adverse side effects related to current therapy. requests samples nexium if available   Clinical Review Panels:  Immunizations   Last Tetanus Booster:  Td (08/07/2008)   Last Flu Vaccine:  Declined  (03/11/2009)   Last Pneumovax:  Pneumovax (Medicare) (02/27/2008)  Lipid Management   Cholesterol:  158 (04/28/2010)   LDL (bad choesterol):  89 (04/28/2010)   HDL (good cholesterol):  39.50 (04/28/2010)   Triglycerides:  70 (01/06/2010)  Diabetes Management   HgBA1C:  6.3 (06/18/2010)   Creatinine:  1.2 (04/28/2010)   Last Foot Exam:  yes (06/12/2009)   Last Flu Vaccine:  Declined (03/11/2009)   Last Pneumovax:  Pneumovax (Medicare) (02/27/2008)  CBC   WBC:  4.8 (04/28/2010)   RBC:  4.16 (04/28/2010)   Hgb:  14.0 (04/28/2010)   Hct:  40.8 (04/28/2010)   Platelets:  157.0 (04/28/2010)   MCV  98.1 (04/28/2010)   MCHC  34.3 (04/28/2010)   RDW  13.7 (04/28/2010)   PMN:  79.7 (04/28/2010)   Lymphs:  9.3 (04/28/2010)   Monos:  9.3 (04/28/2010)   Eosinophils:  1.5 (04/28/2010)   Basophil:  0.2 (04/28/2010)  Complete Metabolic Panel   Glucose:  141 (04/28/2010)   Sodium:  137 (04/28/2010)   Potassium:  4.7 (04/28/2010)   Chloride:  100 (04/28/2010)   CO2:  28 (04/28/2010)   BUN:  25 (04/28/2010)   Creatinine:  1.2 (04/28/2010)   Albumin:  4.3 (04/28/2010)   Total Protein:  7.0 (04/28/2010)   Calcium:  10.5 (04/28/2010)   Total Bili:  0.7 (04/28/2010)  Alk Phos:  69 (04/28/2010)   SGPT (ALT):  17 (04/28/2010)   SGOT (AST):  24 (04/28/2010)   Current Medications (verified): 1)  Niacin Cr 1000 Mg  Tbcr (Niacin) .... Take 2 At Bedtime 2)  Ecotrin 325 Mg  Tbec (Aspirin) .... One By Mouth Once Daily 3)  Bumetanide 1 Mg  Tabs (Bumetanide) .... One By Mouth Once Daily 4)  Diovan 160 Mg  Tabs (Valsartan) .[name].. 1 By Mouth Two Times A Day 5)  Pravastatin Sodium 40 Mg Tabs (Pravastatin Sodium) .... Take 2 At Bedtime 6)  Plavix 75 Mg  Tabs (Clopidogrel Bisulfate) .... Take 1 Tablet By Mouth Once A Day 7)  Nexium 40 Mg  Cpdr (Esomeprazole Magnesium) .[name].. 1 Capsule Each Day 30 Minutes Before Meal 8)  Albuterol Sulfate (2.5 Mg/31ml) 0.083% Nebu (Albuterol Sulfate) .... 2.5 Mg Neb  Three Times A Day As Needed 9)  Norvasc 5 Mg Tabs (Amlodipine Besylate) .... Take One Tabletqd 10)  B-12 500 Mcg Tabs (Cyanocobalamin) .... Take 1 Tablet By Mouth Two Times A Day 11)  Gas-X 80 Mg Chew (Simethicone) .... As Needed 12)  Isosorbide Mononitrate Cr 60 Mg Xr24h-Tab (Isosorbide Mononitrate) .... Take1 and 1/2 Tab By Mouth Once Daily 13)  Promethazine Hcl 25 Mg Tabs (Promethazine Hcl) .... Take 1/2 Tab Every 6 Hours As Needed For Nausea 14)  Mylanta 200-200-20 Mg/5ml Susp (Alum & Mag Hydroxide-Simeth) .... As Needed 15)  Maalox Plus 225-200-25 Mg/34ml Susp (Alum & Mag Hydroxide-Simeth) .... As Needed 16)  Senokot 8.6 Mg Tabs (Sennosides) .... Three Tablets By Mouth At Bedtime Two Times A Week 17)  Hydrocodone-Acetaminophen 5-325 Mg Tabs (Hydrocodone-Acetaminophen) .[name].. 1 By Mouth Q 6 Hrs As Needed  Allergies (verified): 1)  ! * Actos 2)  ! Cipro 3)  ! * Metformin  Past History:  Past Medical History: COPD    Coronary artery disease - ischemic cardiomyopathy EF 50% GERD Hyperlipidemia   Hypertension Peripheral vascular disease  Diverticulitis, hx Asthma Diabetes mellitus, type II, diet controlled  Symptomatic Bradycardia - S/P dual chamber pacemaker, St. Jude model 09/20/2007 PTCA for unstable angina 06/10/2007 - Coronary atherosclerotic heart disease with unstable angina due to       high-grade obstruction in the saphenous vein graft in the first  diagonal.       a.     Resolved after restenting of restenotic Cypher stent in the        proximal graft and new 99% disease in the mid graft.  Partial Small Bowel Obstruction 2009 History of Prostate CA  Low back pain - advanced spondylosis CT myelogram 08/2008 osteoarthritis - R hip IBS    MD roster: endo Sharl Ma ortho - bean/ramos card - nishan/klein - smith  gi - gessner uro - wrenn ent - bates Anemia-NOS  Past Surgical History: Coronary artery bypass graft stents  bilateral cataracts   Pacemaker  Placement BILATERAL INGUINAL HERNIA REPAIRS PENILE PROSTHESIS  Lumbar surgery 12/2008 COLONOSCOPY 2006-DIVERTICULOSIS,2 SMALL POLYPS NOT RETRIEVED  Review of Systems  The patient denies fever, weight loss, headaches, and abdominal pain.    Physical Exam  General:  overweight-appearing.  alert, well-developed, well-nourished, and cooperative to examination.   Lungs:  normal respiratory effort, no intercostal retractions or use of accessory muscles; normal breath sounds bilaterally - no crackles and no wheezes.    Heart:  normal rate, regular rhythm, no murmur, and no rub Psych:  mildly anxious.     Impression & Recommendations:  Problem # 1:  DIABETES MELLITUS-TYPE II (ICD-250.00) off meds since early 2011 - diet controlled follows with endo - recent labs reviewed  His updated medication list for this problem includes:    Ecotrin 325 Mg Tbec (Aspirin) ..... One by mouth once daily    Diovan 160 Mg Tabs (Valsartan) .[name]... 1 by mouth two times a day  Labs Reviewed: Creat: 1.2 (04/28/2010)    Reviewed HgBA1c results: 6.3 (06/18/2010)  6.5 (04/28/2010)  Problem # 2:  HYPERTENSION (ICD-401.9)  His updated medication list for this problem includes:    Bumetanide 1 Mg Tabs (Bumetanide) ..... One by mouth once daily    Diovan 160 Mg Tabs (Valsartan) .[name]... 1 by mouth two times a day    Norvasc 5 Mg Tabs (Amlodipine besylate) .[name]... Take one tabletqd  BP today: 118/60 Prior BP: 130/62 (04/28/2010)  Labs Reviewed: K+: 4.7 (04/28/2010) Creat: : 1.2 (04/28/2010)   Chol: 158 (04/28/2010)   HDL: 39.50 (04/28/2010)   LDL: 89 (04/28/2010)   TG: 149.0 (04/28/2010)  Problem # 3:  HYPERLIPIDEMIA (ICD-272.4)  His updated medication list for this problem includes:    Niacin Cr 1000 Mg Tbcr (Niacin) .[name]... Take 2 at bedtime    Pravastatin Sodium 40 Mg Tabs (Pravastatin sodium) .[name]... Take 2 at bedtime  Labs Reviewed: SGOT: 24 (04/28/2010)   SGPT: 17 (04/28/2010)   HDL:39.50 (04/28/2010),  41 (01/06/2010)  LDL:89 (04/28/2010), 48 (01/06/2010)  Chol:158 (04/28/2010), 103 (01/06/2010)  Trig:149.0 (04/28/2010), 70 (01/06/2010)  Problem # 4:  GERD (ICD-530.81)  His updated medication list for this problem includes:    Nexium 40 Mg Cpdr (Esomeprazole magnesium) .[name]... 1 capsule each day 30 minutes before meal    Mylanta 200-200-20 Mg/41ml Susp (Alum & mag hydroxide-simeth) .[name]... As needed    Maalox Plus 225-200-25 Mg/17ml Susp (Alum & mag hydroxide-simeth) .[name]... As needed  EGD: Hiatal hernia esophageal dilation for dysphagia 56 french  Dr. Kathie Rhodes. [place] (07/17/2003)  Labs Reviewed: Hgb: 14.0 (04/28/2010)   Hct: 40.8 (04/28/2010)  Complete Medication List: 1)  Niacin Cr 1000 Mg Tbcr (Niacin) .... Take 2 at bedtime 2)  Ecotrin 325 Mg Tbec (Aspirin) .... One by mouth once daily 3)  Bumetanide 1 Mg Tabs (Bumetanide) .... One by mouth once daily 4)  Diovan 160 Mg Tabs (Valsartan) .[name].. 1 by mouth two times a day 5)  Pravastatin Sodium 40 Mg Tabs (Pravastatin sodium) .... Take 2 at bedtime 6)  Plavix 75 Mg Tabs (Clopidogrel bisulfate) .... Take 1 tablet by mouth once a day 7)  Nexium 40 Mg Cpdr (Esomeprazole magnesium) .[name].. 1 capsule each day 30 minutes before meal 8)  Albuterol Sulfate (2.5 Mg/108ml) 0.083% Nebu (Albuterol sulfate) .... 2.5 mg neb three times a day as needed 9)  Norvasc 5 Mg Tabs (Amlodipine besylate) .... Take one tabletqd 10)  B-12 500 Mcg Tabs (Cyanocobalamin) .... Take 1 tablet by mouth two times a day 11)  Gas-x 80 Mg Chew (Simethicone) .... As needed 12)  Isosorbide Mononitrate Cr 60 Mg Xr24h-tab (Isosorbide mononitrate) .... Take1 and 1/2 tab by mouth once daily 13)  Promethazine Hcl 25 Mg Tabs (Promethazine hcl) .... Take 1/2 tab every 6 hours as needed for nausea 14)  Mylanta 200-200-20 Mg/52ml Susp (Alum & mag hydroxide-simeth) .... As needed 15)  Maalox Plus 225-200-25 Mg/39ml Susp (Alum & mag hydroxide-simeth) .... As needed 16)  Senokot 8.6 Mg Tabs  (Sennosides) .... Three tablets by mouth at bedtime two times a week 17)  Hydrocodone-acetaminophen 5-325 Mg Tabs (Hydrocodone-acetaminophen) .[name].. 1 by mouth  q 6 hrs as needed  Patient Instructions: 1)  it was good to see you today. 2)  mediations and interval history reviewed today - no changes recommended today 3)  ask dr. bean about another shot for your hip/leg pain 4)  Please schedule a follow-up appointment in 4 months, call sooner if problems.    Orders Added: 1)  Est. Patient Level III [[address]]

## 2010-07-10 NOTE — Letter (Signed)
Summary: Fulton Reek MD/[facility] Shayne Alken MD/[facility]   Imported By: Lester [place] 06/24/2010 09:55:34  _____________________________________________________________________  External Attachment:    Type:   Image     Comment:   External Document

## 2010-07-11 NOTE — Letter (Signed)
Summary: Surgical Clearance/[facility]  Surgical Clearance/[facility]   Imported By: Lester [place] 04/21/2010 08:52:04  _____________________________________________________________________  External Attachment:    Type:   Image     Comment:   External Document

## 2010-07-16 NOTE — Assessment & Plan Note (Signed)
Summary: abdominal pain/IBS/sheri.   Vital Signs:  Patient profile:   77 year old male Height:      68 inches Weight:      253 pounds BMI:     38.61 BSA:     2.26 Pulse rate:   76 / minute Pulse rhythm:   regular BP sitting:   124 / 68  (left arm) Cuff size:   large  Vitals Entered By: Ok Anis CMA (July 10, 2010 1:35 PM)  Visit Type:  Follow-up Visit   History of Present Illness: 77 yo wm with IBS (gas-bloat) and recurrent partial small bowel obstructions.  Two weeks ago his abdomen became distended again and he had significant difficulties with eating (on Jello). He could not get his pants closed around his waste. He did not have pain. he was nauseous but could not vomit. Prior to the distention he went 2-3 days wihout defecatin. He finally started going with multiple doses of MiraLax and is now on daily MiraLax. He says it has been difficult regulating defecation and the MiraLax will get him too loose. He is back to normal now. Abdominal distention occurs 1hour or more after the ingestion of a meal. He does have some early satiety.  Current Medications (verified): 1)  Niacin Cr 1000 Mg  Tbcr (Niacin) .... Take 2 At Bedtime 2)  Ecotrin 325 Mg  Tbec (Aspirin) .... One By Mouth Once Daily 3)  Bumetanide 1 Mg  Tabs (Bumetanide) .... One By Mouth Once Daily 4)  Diovan 160 Mg  Tabs (Valsartan) .[name].. 1 By Mouth Two Times A Day 5)  Pravastatin Sodium 40 Mg Tabs (Pravastatin Sodium) .... Take 2 At Bedtime 6)  Plavix 75 Mg  Tabs (Clopidogrel Bisulfate) .... Take 1 Tablet By Mouth Once A Day 7)  Nexium 40 Mg  Cpdr (Esomeprazole Magnesium) .[name].. 1 Capsule Each Day 30 Minutes Before Meal 8)  Albuterol Sulfate (2.5 Mg/49ml) 0.083% Nebu (Albuterol Sulfate) .... 2.5 Mg Neb Three Times A Day As Needed 9)  Norvasc 5 Mg Tabs (Amlodipine Besylate) .... Take One Tabletqd 10)  B-12 500 Mcg Tabs (Cyanocobalamin) .... Take 1 Tablet By Mouth Two Times A Day 11)  Gas-X 80 Mg Chew (Simethicone)  .... As Needed 12)  Isosorbide Mononitrate Cr 60 Mg Xr24h-Tab (Isosorbide Mononitrate) .... Take1 and 1/2 Tab By Mouth Once Daily 13)  Promethazine Hcl 25 Mg Tabs (Promethazine Hcl) .... Take 1/2 Tab Every 6 Hours As Needed For Nausea 14)  Mylanta 200-200-20 Mg/71ml Susp (Alum & Mag Hydroxide-Simeth) .... As Needed 15)  Maalox Plus 225-200-25 Mg/12ml Susp (Alum & Mag Hydroxide-Simeth) .... As Needed 16)  Senokot 8.6 Mg Tabs (Sennosides) .... Three Tablets By Mouth At Bedtime Two Times A Week 17)  Hydrocodone-Acetaminophen 5-325 Mg Tabs (Hydrocodone-Acetaminophen) .[name].. 1 By Mouth Q 6 Hrs As Needed 18)  Miralax   Powd (Polyethylene Glycol 3350) .... 1/2 - 1 Cap in [employer] Daily  Allergies (verified): 1)  ! * Actos 2)  ! Cipro 3)  ! * Metformin  Past History:  Past Medical History: COPD    Coronary artery disease - ischemic cardiomyopathy EF 50% GERD Hyperlipidemia   Hypertension Peripheral vascular disease  Diverticulitis, hx Asthma Diabetes mellitus, type II, diet controlled  Symptomatic Bradycardia - S/P dual chamber pacemaker, St. Jude model 09/20/2007 PTCA for unstable angina 06/10/2007 - Coronary atherosclerotic heart disease with unstable angina due to       high-grade obstruction in the saphenous vein graft in the first  diagonal.       a.     Resolved after restenting of restenotic Cypher stent in the        proximal graft and new 99% disease in the mid graft.  Partial Small Bowel Obstruction 2009 History of Prostate CA  Low back pain - advanced spondylosis CT myelogram 08/2008 osteoarthritis - R hip IBS Hiatal Hernia--EGD 2005     MD roster: Rolly Salter Sharl Ma ortho - bean/ramos card - nishan/klein - smith  gi - Melesio [facility] uro - wrenn ent - bates Anemia-NOS  Past Surgical History: Reviewed history from 06/26/2010 and no changes required. Coronary artery bypass graft stents  bilateral cataracts   Pacemaker Placement BILATERAL INGUINAL HERNIA REPAIRS PENILE PROSTHESIS   Lumbar surgery 12/2008 COLONOSCOPY 2006-DIVERTICULOSIS,2 SMALL POLYPS NOT RETRIEVED   Family History: Reviewed history from 03/11/2009 and no changes required. Pt's parents died when he was 11 years old. No knowledge of family history.     Social History: Reviewed history from 04/17/2010 and no changes required. Alcohol use-no Tobacco-no quit 15 years ago Retired   lives alone, but has lady friend Illicit Drug Use - no  Patient gets regular exercise.   Review of Systems       right hip pain, has to walk bent over  Physical Exam  General:  obese.  elderly NAD Eyes:  anicteric Lungs:  Clear throughout to auscultation. Heart:  distant S1 and S2 Abdomen:  obese BS+ and slightly increased no splash soft and benign but some diffuse tenderness no masses or significant herniae though he has a diastasis recti  Psych:  anxious and frustrated   Impression & Recommendations:  Problem # 1:  ABDOMINAL DISTENSION (ICD-787.3) Assessment Deteriorated He is having more problems though he is better than he was 2 weeks ago. Difficult issue that recurs and is likely a combination of IBS, dysmotilty, adhesions.  His weightis up some from recent #'s but in line with a year ago He is quite distressed about thuis but it is really similar to previous problems. Had abd/pelvic CT a few months ago without significant problems I am not sure there is much else to offer from a therapy standpoint. Have given diet advice today. Also advised to maintain MiraLax dosing regularly and not intermittent. Will consider a trial of metaclopramide (5mg ) if he persists with problems. Try on an intermittent basis. Orders: T-Abdomen 2-view (74020TC)  Problem # 2:  IRRITABLE BOWEL SYNDROME (ICD-564.1) Assessment: Deteriorated He has tried antibiotics, laxatives, other IBS agents but has had recurrent problems. The concomitant adhesion and partial SBO problems cloud the issue.  Patient Instructions: 1)  Copy  sent to : Otho Najjar, MD  2)  We are giving you a Gas and Flatulence Prevention diet today 3)  You will go to the basement for your x-ray today 4)  The medication list was reviewed and reconciled.  All changed / newly prescribed medications were explained.  A complete medication list was provided to the patient / caregiver.   Orders Added: 1)  T-Abdomen 2-view [[id number]]   Clinical Data: Abdominal pain   ABDOMEN - 2 VIEW   Comparison: CT 02/13/2010   Findings: Degenerative changes in the lumbar spine.  Radiation seeds in the region of the prostate.  Penile prosthesis partially visualized.  Previous median sternotomy.  Transvenous pacing leads are partially seen. Degenerative changes of bilateral hips, right worse than left.   No free air on the erect film.  Small bowel decompressed.  Moderate fecal material  in the proximal colon, decompressed distally.   IMPRESSION:   1.  Nonobstructive bowel gas pattern with moderate proximal colonic fecal material. 2.  No free air. 3.  Postoperative and degenerative changes as above.   Original Report Authenticated By: Osa Craver, M.D.

## 2010-07-16 NOTE — Progress Notes (Signed)
Summary: xray results and plans   Phone Note Outgoing Call   Summary of Call: Let him know the xray did not show an obstruction now I think IBS, swallowing air and eating gassy fods are part of issue. i want him to try the diet and daily MiraLax as we discussed. adjust dose of Miralax downward if loose stools and may need to hold it a day or 2 but he should try not to get constipated. Follow-up as needed Iva Boop MD, [facility]  July 10, 2010 6:48 PM   Follow-up for Phone Call        PT advised of Dr Marvell Fuller recommendations. Follow-up by: Darcey Nora RN, CGRN,  July 11, 2010 9:30 AM

## 2010-07-18 ENCOUNTER — Telehealth: Payer: Self-pay | Admitting: Internal Medicine

## 2010-07-24 NOTE — Progress Notes (Signed)
Summary: BP?  Phone Note Call from Patient Call back at Home Phone [phone]   Caller: Patient Summary of Call: Pt left message requesting a callback regarding his BP Initial call taken by: Brenton Grills CMA [name]),  July 18, 2010 11:02 AM  Follow-up for Phone Call        pt states that his BP readings over the past couple of days have been low: 91/48, 82/51, 97/60. Pt states that he has felt lightheaded and dizzy, but that he feels okay now. Pt states that he has not taken his BP medication this morning-please advise Follow-up by: Brenton Grills CMA [name]),  July 18, 2010 11:06 AM  Additional Follow-up for Phone Call Additional follow up Details #1::        hold norvasc and take Diovan only once daily - call if cont SBP<100 or dizzy symptoms  Additional Follow-up by: Newt Lukes MD,  July 18, 2010 12:21 PM    Additional Follow-up for Phone Call Additional follow up Details #2::    pt informed of MD's advisement Follow-up by: Brenton Grills CMA [name]),  July 18, 2010 12:29 PM  New/Updated Medications: DIOVAN 160 MG  TABS (VALSARTAN) 1 by mouth once daily NORVASC 5 MG TABS (AMLODIPINE BESYLATE) HOLD

## 2010-08-28 ENCOUNTER — Inpatient Hospital Stay (HOSPITAL_BASED_OUTPATIENT_CLINIC_OR_DEPARTMENT_OTHER)
Admission: RE | Admit: 2010-08-28 | Discharge: 2010-08-28 | Disposition: A | Discharge status: Home / Self Care | Payer: Medicare Other | Source: Ambulatory Visit | Attending: Interventional Cardiology | Admitting: Interventional Cardiology

## 2010-08-28 DIAGNOSIS — I2510 Atherosclerotic heart disease of native coronary artery without angina pectoris: Secondary | ICD-10-CM | POA: Insufficient documentation

## 2010-08-28 DIAGNOSIS — I2582 Chronic total occlusion of coronary artery: Secondary | ICD-10-CM | POA: Insufficient documentation

## 2010-08-28 DIAGNOSIS — Z9861 Coronary angioplasty status: Secondary | ICD-10-CM | POA: Insufficient documentation

## 2010-08-28 DIAGNOSIS — I25810 Atherosclerosis of coronary artery bypass graft(s) without angina pectoris: Secondary | ICD-10-CM | POA: Insufficient documentation

## 2010-09-04 ENCOUNTER — Observation Stay (HOSPITAL_COMMUNITY)
Admission: RE | Admit: 2010-09-04 | Discharge: 2010-09-05 | Disposition: A | Discharge status: Home / Self Care | Payer: Medicare Other | Source: Ambulatory Visit | Attending: Interventional Cardiology | Admitting: Interventional Cardiology

## 2010-09-04 DIAGNOSIS — K219 Gastro-esophageal reflux disease without esophagitis: Secondary | ICD-10-CM | POA: Insufficient documentation

## 2010-09-04 DIAGNOSIS — E785 Hyperlipidemia, unspecified: Secondary | ICD-10-CM | POA: Insufficient documentation

## 2010-09-04 DIAGNOSIS — I440 Atrioventricular block, first degree: Secondary | ICD-10-CM | POA: Insufficient documentation

## 2010-09-04 DIAGNOSIS — Z01812 Encounter for preprocedural laboratory examination: Secondary | ICD-10-CM | POA: Insufficient documentation

## 2010-09-04 DIAGNOSIS — I25810 Atherosclerosis of coronary artery bypass graft(s) without angina pectoris: Principal | ICD-10-CM | POA: Insufficient documentation

## 2010-09-04 DIAGNOSIS — Z950 Presence of cardiac pacemaker: Secondary | ICD-10-CM | POA: Insufficient documentation

## 2010-09-04 DIAGNOSIS — J449 Chronic obstructive pulmonary disease, unspecified: Secondary | ICD-10-CM | POA: Insufficient documentation

## 2010-09-04 DIAGNOSIS — J4489 Other specified chronic obstructive pulmonary disease: Secondary | ICD-10-CM | POA: Insufficient documentation

## 2010-09-04 DIAGNOSIS — Z87891 Personal history of nicotine dependence: Secondary | ICD-10-CM | POA: Insufficient documentation

## 2010-09-04 DIAGNOSIS — E119 Type 2 diabetes mellitus without complications: Secondary | ICD-10-CM | POA: Insufficient documentation

## 2010-09-04 DIAGNOSIS — Z01810 Encounter for preprocedural cardiovascular examination: Secondary | ICD-10-CM | POA: Insufficient documentation

## 2010-09-04 DIAGNOSIS — I10 Essential (primary) hypertension: Secondary | ICD-10-CM | POA: Insufficient documentation

## 2010-09-04 DIAGNOSIS — Z8546 Personal history of malignant neoplasm of prostate: Secondary | ICD-10-CM | POA: Insufficient documentation

## 2010-09-04 LAB — BASIC METABOLIC PANEL
BUN: 16 mg/dL (ref 6–23)
CO2: 24 mEq/L (ref 19–32)
GFR calc non Af Amer: >60 mL/min (ref >60)
Glucose, Bld: 112 mg/dL — ABNORMAL HIGH (ref 70–99)
Potassium: 4.3 mEq/L (ref 3.5–5.1)

## 2010-09-04 LAB — CBC
HCT: 35.8 % — ABNORMAL LOW (ref 39.0–52.0)
Hemoglobin: 11.9 g/dL — ABNORMAL LOW (ref 13.0–17.0)
MCHC: 33.2 g/dL (ref 30.0–36.0)
MCV: 96.2 fL (ref 78.0–100.0)
RDW: 12.4 % (ref 11.5–15.5)
WBC: 5.1 10*3/uL (ref 4.0–10.5)

## 2010-09-04 LAB — GLUCOSE, CAPILLARY
Glucose-Capillary: 125 mg/dL — ABNORMAL HIGH (ref 70–99)
Glucose-Capillary: 147 mg/dL — ABNORMAL HIGH (ref 70–99)

## 2010-09-05 LAB — CBC
MCV: 97.3 fL (ref 78.0–100.0)
Platelets: 135 10*3/uL — ABNORMAL LOW (ref 150–400)
RBC: 3.73 MIL/uL — ABNORMAL LOW (ref 4.22–5.81)
RDW: 12.5 % (ref 11.5–15.5)
WBC: 4.2 10*3/uL (ref 4.0–10.5)

## 2010-09-05 LAB — BASIC METABOLIC PANEL
Chloride: 105 mEq/L (ref 96–112)
GFR calc Af Amer: >60 mL/min (ref >60)
GFR calc non Af Amer: >60 mL/min (ref >60)
Potassium: 4.2 mEq/L (ref 3.5–5.1)
Sodium: 138 mEq/L (ref 135–145)

## 2010-09-05 LAB — GLUCOSE, CAPILLARY: Glucose-Capillary: 144 mg/dL — ABNORMAL HIGH (ref 70–99)

## 2010-09-07 NOTE — Discharge Summary (Signed)
NAME:  Eric [name], Eric [name] NO.:  [id number]  MEDICAL RECORD NO.:  [id number]           PATIENT TYPE:  O  LOCATION:  6525                         FACILITY:  [facility]  PHYSICIAN:  Jake Bathe, MD      DATE OF BIRTH:  1926/12/14  DATE OF ADMISSION:  09/04/2010 DATE OF DISCHARGE:  09/05/2010                              DISCHARGE SUMMARY   FINAL DIAGNOSES: 1. Coronary artery disease - Procedure left heart catheterization by     cardiologist, Dr. Verdis Prime, drug-eluting stent saphenous vein     graft to obtuse marginal I and obtuse marginal II.  He had     successful stent drug-eluting stent saphenous vein graft to left     circumflex.  He is to take Plavix for 1 year, discussed that with     him.  I have also given him a prescription for Protonix which he     can take to the [facility]  If he needs further assistance, he     can call our office for prescription care.  He was ambulating well     without any difficulty on day of discharge.  His leg is fine,     clean,, dry and intact without any signs or evidence of significant     hematoma.  His creatinine is 0.9.  His hemoglobin is 12.2 and his     EKG shows sinus rhythm, first-degree AV block with minor     interventricular conduction delay. 2. Prior bypass surgery x4, lift internal mammary artery to left     anterior descending, saphenous vein graft to intermediate obtuse     marginal, saphenous vein graft to posterior descending artery,     saphenous vein graft to diagonal in 1995.  He has had multiple     percutaneous coronary interventions with drug-eluting stents to     saphenous vein graft diagonal in 2008, drug-eluting stents for ISR     2009.  Saphenous vein graft to circumflex stenosis in 2010 cath. 3. Hyperlipidemia. 4. Hypertension. 5. Gastroesophageal reflux disease. 6. Chronic obstructive pulmonary disease. 7. Sick sinus syndrome status post DDD pacemaker insertion. 8. Prostate cancer. 9.  Diabetes. 10.Former smoker.  Cardiovascularly, he is regular rate and rhythm currently, doing well. He is a follow up appointment in 1 week with Aram Beecham Ferguson/Dr. Verdis Prime.  Primary care physician is Dr. Rene Paci.  BRIEF HOSPITAL COURSE:  This gentleman is an 77 year old male followed by Dr. Katrinka Blazing with multiple PCIs, prior bypass surgery, increasing angina, increasing nitroglycerin use, inferolateral ischemia on Lexiscan nuclear stress test.  Dr. Katrinka Blazing placed stent as described above and did have a long discussion with him that he may still have angina secondary to his native vessel disease.  He continues with isosorbide.  DISCHARGE MEDICATIONS: 1. Aspirin 325 mg once a day. 2. Clopidogrel 75 mg once a day. 3. Pantoprazole 40 mg twice a day. 4. Albuterol. 5. Aleve - Did not recommend taking. 6. Amlodipine 2.5 mg a day. 7. Bumetanide 1 mg a day. 8. Valsartan or Diovan 160 mg twice a day. 9. Vicodin as needed. 10.Isosorbide  mononitrate 60 mg, take one and half tablet daily. 11.Niaspan 1000 mg a day. 12.Pravachol 20 mg 3 tablets at bedtime. 13.Senna. 14.Vitamin B12. 15.Vitamin C. I made it a very important point to illustrate that he needs to take his Plavix for at least 1 year.  I have also given him a prescription for the pantoprazole for which he can get it filled at the [facility].  If there is any difficulty with this, he can call either his primary care physician or our office and we can help him with this.     Jake Bathe, MD     MCS/MEDQ  D:  09/05/2010  T:  09/06/2010  Job:  [id number]  cc:   Vikki Ports A. Felicity Coyer, MD  Electronically Signed by Donato Schultz MD on 09/07/2010 07:50:08 AM

## 2010-09-11 LAB — GLUCOSE, CAPILLARY
Glucose-Capillary: 106 mg/dL — ABNORMAL HIGH (ref 70–99)
Glucose-Capillary: 110 mg/dL — ABNORMAL HIGH (ref 70–99)

## 2010-09-14 LAB — GLUCOSE, CAPILLARY
Glucose-Capillary: 112 mg/dL — ABNORMAL HIGH (ref 70–99)
Glucose-Capillary: 115 mg/dL — ABNORMAL HIGH (ref 70–99)
Glucose-Capillary: 116 mg/dL — ABNORMAL HIGH (ref 70–99)
Glucose-Capillary: 118 mg/dL — ABNORMAL HIGH (ref 70–99)
Glucose-Capillary: 126 mg/dL — ABNORMAL HIGH (ref 70–99)

## 2010-09-14 LAB — CBC
HCT: 29.6 % — ABNORMAL LOW (ref 39.0–52.0)
HCT: 34.5 % — ABNORMAL LOW (ref 39.0–52.0)
Hemoglobin: 10.1 g/dL — ABNORMAL LOW (ref 13.0–17.0)
MCHC: 34.2 g/dL (ref 30.0–36.0)
MCHC: 34.6 g/dL (ref 30.0–36.0)
Platelets: 122 10*3/uL — ABNORMAL LOW (ref 150–400)
Platelets: 143 10*3/uL — ABNORMAL LOW (ref 150–400)
RBC: 2.93 MIL/uL — ABNORMAL LOW (ref 4.22–5.81)
RDW: 12.8 % (ref 11.5–15.5)
RDW: 13.2 % (ref 11.5–15.5)
RDW: 13.3 % (ref 11.5–15.5)
WBC: 4.4 10*3/uL (ref 4.0–10.5)

## 2010-09-14 LAB — COMPREHENSIVE METABOLIC PANEL
AST: 28 U/L (ref 0–37)
Albumin: 3.8 g/dL (ref 3.5–5.2)
Alkaline Phosphatase: 64 U/L (ref 39–117)
BUN: 18 mg/dL (ref 6–23)
Chloride: 106 mEq/L (ref 96–112)
Creatinine, Ser: 1.1 mg/dL (ref 0.4–1.5)
GFR calc Af Amer: >60 mL/min (ref >60)
Potassium: 4.1 mEq/L (ref 3.5–5.1)
Total Protein: 6.5 g/dL (ref 6.0–8.3)

## 2010-09-14 LAB — BASIC METABOLIC PANEL
CO2: 25 mEq/L (ref 19–32)
CO2: 26 mEq/L (ref 19–32)
Chloride: 107 mEq/L (ref 96–112)
GFR calc Af Amer: >60 mL/min (ref >60)
GFR calc Af Amer: >60 mL/min (ref >60)
Glucose, Bld: 117 mg/dL — ABNORMAL HIGH (ref 70–99)
Potassium: 3.8 mEq/L (ref 3.5–5.1)
Potassium: 3.8 mEq/L (ref 3.5–5.1)
Sodium: 139 mEq/L (ref 135–145)

## 2010-09-14 LAB — URINALYSIS, ROUTINE W REFLEX MICROSCOPIC
Glucose, UA: NEGATIVE mg/dL
Ketones, ur: NEGATIVE mg/dL
Protein, ur: NEGATIVE mg/dL
Urobilinogen, UA: 1 mg/dL (ref 0.0–1.0)

## 2010-09-14 LAB — TYPE AND SCREEN
ABO/RH(D): A POS
Antibody Screen: NEGATIVE

## 2010-09-14 LAB — APTT: aPTT: 37 seconds (ref 24–37)

## 2010-09-14 LAB — PROTIME-INR: INR: 1 (ref 0.00–1.49)

## 2010-09-20 ENCOUNTER — Encounter: Payer: Self-pay | Admitting: Internal Medicine

## 2010-09-26 NOTE — Cardiovascular Report (Signed)
NAME:  Eric [name], Eric [name] NO.:  [id number]  MEDICAL RECORD NO.:  [id number]            PATIENT TYPE:  LOCATION:                                 FACILITY:  PHYSICIAN:  Lyn Records, M.D.   DATE OF BIRTH:  1926/10/15  DATE OF PROCEDURE:  08/28/2010 DATE OF DISCHARGE:                           CARDIAC CATHETERIZATION   INDICATIONS FOR PROCEDURE:  Progressive angina pectoris and the patient with known coronary artery disease, previous coronary bypass surgery, and previous stenting of the saphenous vein graft to the diagonal.  He developed restenosis of this graft, and it was restented.  The recent progressive anginal symptoms correlate with the region of inferolateral and midlateral wall ischemia.  This was denoted on a recent nuclear study.  PROCEDURE PERFORMED: 1. Left heart catheterization. 2. Selective coronary angiography. 3. Left ventriculography. 4. Saphenous vein graft angiography. 5. Internal mammary graft angiography.  DESCRIPTION:  After informed consent, a 4-French sheath was placed in the right femoral artery using a modified Seldinger technique.  The patient received 1 mg of Versed and 50 mcg of fentanyl.  We then performed left heart catheterization and coronary angiography.  Left ventriculography bypass graft angiography and internal mammary graft angiography using preformed catheters from a multipack.  We also used internal mammary artery catheter.  A left bypass graft catheter was also used.  The patient tolerated the procedure without complications.  RESULTS: 1. Hemodynamic data:     a.     Aortic pressure 122/50.     b.     Left ventricular pressure 123/30. 2. Left ventriculography:  Normal. 3. Coronary angiography:     a.     Left main coronary:  Heavily calcified.  Diffuse 40-60%      narrowing.     b.     Left anterior descending coronary:  80% ostial narrowing      with heavy calcification present.  Competitive flow is noted in   the midvessel.     c.     Circumflex artery:  The proximal circumflex just beyond the      origin of first obtuse marginal is 70% obstructed and heavily      calcified.  The ostium of the first obtuse marginal is 80%      narrowed.  The obtuse marginal branches are occluded.     d.     Right coronary:  Totally occluded in the ostial segment. 4. Bypass graft angiography.     a.     Saphenous vein sequential to the ramus intermedius and      obtuse marginal:  This graft is degenerated and contains a midbody      somewhat eccentric 80% stenosis.  The distal territory supplied by      this graft is moderate in size and ischemia noted on the patient's      Cardiolite study.     b.     Saphenous vein graft to the diagonal:  This graft was      previously stented.  The stent is patent.  There is diffuse in-      stent  restenosis up to 30%.     c.     Saphenous vein graft RCA:  Widely patent. 5. Left internal mammary graft to the LAD:  Widely patent into the     distal segment of the LAD.  CONCLUSIONS: 1. Severe native vessel coronary artery disease with total occlusion     of the right coronary, the ramus intermedius, the distal large     obtuse marginal branch, 80% stenosis in the ostium of the LAD, and     diffuse 40-60% narrowing of the left main. 2. Bypass graft disease with patent stent in the saphenous vein graft     to the diagonal and diffuse disease throughout the midsegment of     the saphenous vein graft to the circumflex.  This graft was     sequential to the ramus and distal circumflex/obtuse marginal.  The     saphenous vein graft to the right coronary is widely patent. 3. Widely patent LIMA to the LAD. 4. Normal LV function. 5. The obstruction and the saphenous vein graft to the ramus/OM likely     accounts to the region of ischemia noted on Cardiolite.  PLAN:  Consider stenting of the saphenous vein graft with distal protection to prevent no reflow.  We will discuss with  the patient.  The indication for this procedure would be unacceptable anginal symptoms and documentation of ischemia, and a moderate territory by angiography.     Lyn Records, M.D.     HWS/MEDQ  D:  08/28/2010  T:  08/29/2010  Job:  [id number]  cc:   Vikki Ports A. Felicity Coyer, MD  Electronically Signed by Verdis Prime M.D. on 09/26/2010 04:55:18 PM

## 2010-09-26 NOTE — Cardiovascular Report (Signed)
  NAME:  NIL, XIONG NO.:  [id number]  MEDICAL RECORD NO.:  [id number]           PATIENT TYPE:  O  LOCATION:  [facility]                         FACILITY:  [facility]  PHYSICIAN:  Lyn Records, M.D.   DATE OF BIRTH:  [birth year]  DATE OF PROCEDURE:  09/04/2010 DATE OF DISCHARGE:                           CARDIAC CATHETERIZATION   PRIMARY CARE PHYSICIAN:  Valerie A. Felicity Coyer, MD  INDICATIONS:  The patient has a history of previous bypass in 1995. Recent anginal episodes have lead to evaluation for ischemia.  A recent nuclear study demonstrated lateral wall ischemia.  Diagnostic catheterization demonstrated an 80% midbody saphenous vein graft stenosis in the graft that is sequentially anastomosed to the first and second obtuse marginal branches.  This procedure is being done to relieve obstruction in the saphenous vein graft.  PROCEDURE PERFORMED:  Filter wire distal protection with drug-eluting stent implantation in the midbody of the sequential saphenous vein graft to the circumflex.  SURGEON:  Lyn Records, MD  DESCRIPTION OF PROCEDURE:  The patient was brought to the cath lab.  He was in the postabsorptive state.  Consent had been obtained in the outpatient area prior to coming to the cath lab.  He received 300 mg of Plavix in the outpatient center.  The right iliofemoral region was sterilely prepped and draped.  There was ecchymosis but no hematoma from the catheterization performed last week.  The cath site was not promptly draped and we repositioned the target hole prior to accessing the artery.  A 6-French sheath was inserted using the modified Seldinger technique.  Angiomax bolus followed by infusion was started and ACT documented to be greater than 300.  We then used a filter wire, large device and deployed it just proximal to the side-to-side anastomoses with the first obtuse marginal in the distal portion of saphenous vein graft.  We then deployed a  4.0 x 20 mm long Promus Element stent to nominal pressure.  Postdilatation was performed with a 15-mm long x 5.0 mm [place] TREK balloon.  12 atmospheres of distending pressure was administered for 30 seconds.  The filter wire was then removed.  200 mcg of intracoronary nitroglycerin was administered into the graft.  Final angiographic images were obtained. I did not attempt to use a closure device because of the repositioning of the sterile field after prep.  CONCLUSION:  Successful stenting of the midbody of saphenous vein sequential graft to the first and second obtuse marginals with reduction in stenosis from 80% to less than 10% with TIMI grade 3 flow.  No ischemic complications occurred.  The graft was postdilated to 5.0 mm in diameter.  PLAN:  Aspirin and Plavix.  Other medications will be continued as previous.  Planned discharge on September 05, 2010.     Lyn Records, M.D.     HWS/MEDQ  D:  09/04/2010  T:  09/05/2010  Job:  [id number]  cc:   Vikki Ports A. Felicity Coyer, MD  Electronically Signed by Verdis Prime M.D. on 09/26/2010 04:55:22 PM

## 2010-10-09 ENCOUNTER — Encounter: Payer: Self-pay | Admitting: Internal Medicine

## 2010-10-16 ENCOUNTER — Encounter: Payer: Self-pay | Admitting: Internal Medicine

## 2010-10-16 ENCOUNTER — Ambulatory Visit (INDEPENDENT_AMBULATORY_CARE_PROVIDER_SITE_OTHER): Payer: Medicare Other | Admitting: Internal Medicine

## 2010-10-16 DIAGNOSIS — E785 Hyperlipidemia, unspecified: Secondary | ICD-10-CM

## 2010-10-16 DIAGNOSIS — E119 Type 2 diabetes mellitus without complications: Secondary | ICD-10-CM

## 2010-10-16 DIAGNOSIS — I10 Essential (primary) hypertension: Secondary | ICD-10-CM

## 2010-10-16 NOTE — Assessment & Plan Note (Signed)
new DES 08/2010, hx same and CABG 1995 On statin - check lipids now

## 2010-10-16 NOTE — Patient Instructions (Signed)
It was good to see you today. Test(s) ordered today. Your results will be called to you after review (48-72hours after test completion). If any changes need to be made, you will be notified at that time. Medications reviewed, no changes at this time. Please schedule followup in 3-4 months, call sooner if problems.  

## 2010-10-16 NOTE — Assessment & Plan Note (Signed)
Diet controlled, no longer follows with endo (prev kerr) Check a1c q6 mo - due now Intolerant of metformin due to nausea and "hospitalization"

## 2010-10-16 NOTE — Assessment & Plan Note (Signed)
BP Readings from Last 3 Encounters:  10/16/10 132/52  07/10/10 124/68  06/26/10 118/60   The current medical regimen is effective;  continue present plan and medications.

## 2010-10-16 NOTE — Progress Notes (Signed)
Subjective:    Patient ID: Eric [name], male    DOB: [birth year], 77 y.o.   MRN: [id number]  HPI  here for follow up - routine care also at [facility]  CAD, hx CABG and multiple PTCA/DES - most recent hosp 08/2010 reviewed with resolution of shoulder pain following DES. Follows with local cards ([place]) - no residual CP/neck or shoulder symptoms  - the patient reports compliance with medication(s) as prescribed. Denies adverse side effects.  chronic low back pain and DDD - a/w gait/balance do- hx falls early 2011but none since starting steroid shots (ramos) has undergone injection to right hip with near 100% resolution of pain and walking/balance problems- would like to cont to work with PT for balance - ?R THR if cleared by cards -- pt not sure he wants surg  DM2 hx, diet controlled -  Previously stopped Actos due to fluid retention and rash- now seeing endo, kerr  q41mo- who stopped all meds including metformin checks sugars infreq now - only goes over 120 if eat something sweet   HTN -  reports compliance with ongoing medical treatment and no changes in medication dose or frequency. denies adverse side effects related to current therapy.   dyslipidemia - reports compliance with ongoing medical treatment and no changes in medication dose or frequency. denies adverse side effects related to current therapy.   GERD - reports compliance with ongoing medical treatment and no changes in medication dose or frequency. denies adverse side effects related to current therapy. requests samples nexium if available  Past Medical History  Diagnosis Date  . LACTOSE INTOLERANCE   . OBESITY   . CORONARY ARTERY DISEASE     CABG 1995, PTCA/DES 2008, 2009 and 08/2010  . CARDIOMYOPATHY, ISCHEMIC   . AORTIC SCLEROSIS   . SICK SINUS/ TACHY-BRADY SYNDROME 09/2007    s/p PPM st judes  . PERIPHERAL VASCULAR DISEASE   . CAROTID BRUIT, RIGHT 02/27/2008  . Asthma   . IBS (irritable bowel syndrome)   . ALLERGIC  RHINITIS   . ANEMIA-NOS   . OA (osteoarthritis)   . Prostate cancer   . COPD   . DIABETES MELLITUS-TYPE II   . GERD   . HYPERLIPIDEMIA   . HYPERTENSION   . HIATAL HERNIA      Review of Systems  Constitutional: Negative for fever and fatigue.  Respiratory: Negative for shortness of breath.   Genitourinary: Negative for difficulty urinating.  Musculoskeletal: Negative for myalgias.       Objective:   Physical Exam BP 132/52  Pulse 60  Temp(Src) 98.6 F (37 C) (Oral)  Ht 5\' 8"  (1.727 m)  Wt 247 lb 1.9 oz (112.093 kg)  BMI 37.57 kg/m2  SpO2 90%  Physical Exam  Constitutional:  Overweight; oriented to person, place, and time. appears well-developed and well-nourished. No distress.  Neck: Normal range of motion. Neck supple. No JVD present. No thyromegaly present.  Cardiovascular: Normal rate, regular rhythm and normal heart sounds.  No murmur heard. trace BLE edema Pulmonary/Chest: Effort normal and breath sounds normal. No respiratory distress. no wheezes.  Neurological: he is alert and oriented to person, place, and time. No cranial nerve deficit. Coordination normal.  Psychiatric: he has a normal mood and affect. behavior is normal. Judgment and thought content normal.       Lab Results  Component Value Date   WBC 4.2 09/05/2010   HGB 12.2* 09/05/2010   HCT 36.3* 09/05/2010   PLT 135* 09/05/2010   CHOL  158 04/28/2010   TRIG 149.0 04/28/2010   HDL 39.50 04/28/2010   ALT 17 04/28/2010   AST 24 04/28/2010   NA 138 09/05/2010   K 4.2 09/05/2010   CL 105 09/05/2010   CREATININE 0.99 09/05/2010   BUN 11 09/05/2010   CO2 25 09/05/2010   TSH 1.19 04/28/2010   PSA 0.02* 09/10/2006   INR 1.0 12/11/2008   HGBA1C 6.3 06/18/2010   MICROALBUR 0.5 04/18/2009      Assessment & Plan:  See problem list. Medications and labs reviewed today.

## 2010-10-17 ENCOUNTER — Encounter: Payer: Self-pay | Admitting: Internal Medicine

## 2010-10-21 NOTE — Discharge Summary (Signed)
NAME:  Eric [name], Eric [name] NO.:  [id number]   MEDICAL RECORD NO.:  [id number]          PATIENT TYPE:  INP   LOCATION:  2037                         FACILITY:  [facility]   PHYSICIAN:  Guy Franco, P.A.       DATE OF BIRTH:  [birth year]   DATE OF ADMISSION:  09/12/2007  DATE OF DISCHARGE:  09/21/2007                               DISCHARGE SUMMARY   DISCHARGE DIAGNOSES:  1. Symptomatic bradycardia.  2. Chronotropic incompetence.  3. Status post dual-chamber pacemaker, St. Jude model.  4. Coronary artery disease.  5. History of bypass surgery, medical management.  6. New York Heart Association class III.  7. Congestive heart failure, chronic.  8. Ischemic cardiomyopathy, ejection fraction 50%.   Eric [name] is a 77 year old male patient who was admitted for the office  on September 12, 2007 with a 2-week history of substernal chest pain located  in midsternal area radiating into the left chest.  The episodes  responded to sublingual nitroglycerin and the pain was similar to that  which has helped him in the past with his angina.  He has also noted  increased shortness of breath as well as fatigue.   Given acute coronary syndrome, the patient was taken to the cardiac  catheterization lab on September 14, 2007, and was found to have no  significant change from prior angiogram.  Native vessel showed a left  main circumflex 50% proximal, 90% after the first OM1, 90% OM1 lesion  from heart by graft, LAD 70% main lesion 60% to 70% ostial lesion, right  coronary artery was occluded proximally.   Bypass Graft:  1. Sequential saphenous vein graft to the ramus and OM1 was patent but      had severe nonobstructive diffuse atherosclerosis from the ostial      of the distal anastomosis 60% to 70%.  The saphenous vein graft to      the diagonal had a 40% ostial lesion with 50% distal anastomotic      lesion.  The saphenous vein graft to the PDA was diffusely diseased      with 50% to 70%  proximal, mid and distal lesions.  LIMA to LAD was      patent, however, please note that there is no significant change      from prior angiograms.  Medical monitoring was indicated.   During the hospitalization, the patient was found to be severe  bradycardic in the 40s, typically when he was asleep.  It was felt that  he is fatigued and his syncope is secondary to chronotropic  incompetence.  The beta-blocker was held.  He had not been on beta-  blockers prior to admission.  Ultimately, electrophysiology consult was  obtained under the care of Dr. Sherryl Manges and the patient was found to  have chronotropic bradycardia and was felt to be a candidate for  permanent pacemaker, which was then planning on September 20, 2007, St. Jude  dual-chamber.  The following day, the patient was felt ready to be  discharged home.   LABORATORY STUDIES:  Hemoglobin 10.7, hematocrit 31.3, white count  of  4.3, and platelets 187.  Sodium 138, potassium 4.0, BUN 47, and  creatinine 1.34.  BNP on admission was less than 30.  Chest x-ray on  September 21, 2007 showed no pneumothorax.   The patient is discharged to home in stable, but improved condition.   DISCHARGE MEDICATIONS:  1. Actos 30 mg a day.  2. Plavix 75 mg a day.  3. Bumex 1 mg a day.  4. Coated-aspirin 325 mg a day.  5. Niacin 1000 mg a day.  6. Nexium 40 mg a day.  7. Singulair 10 mg a day.  8. Celexa 40 mg a day.  9. Norvasc 5 mg a day.  10.Prilosec as per admission.  11.CPAP at night and 2 L of oxygen at home as before.  12.He is on Diovan 60 mg 1 tablet twice a day but this was held during      this hospitalization and his blood pressure is under good control.      We will hold this and reevaluate his blood pressure at the time of      hospital followup.   He is to remain on a low-sodium, heart healthy, diabetic diet.  Clean  over site and wound care instruction sheets were given to the patient.  He is to increase his activity slowly.  No  lifting over 10 pounds for 1  week.  __________  Dr. Michaelle Copas office for a lab work on September 26, 2007  for cholesterol check.  Follow up with Dr. Katrinka Blazing on October 03, 2007 at 1  p.m.  In the pacemaker clinic at Dr. Michaelle Copas office, we will have  followup with the patient in our clinic.  Dr. Graciela Husbands will be available if  any assistance is needed.      Guy Franco, P.A.     LB/MEDQ  D:  09/21/2007  T:  09/22/2007  Job:  [id number]

## 2010-10-21 NOTE — H&P (Signed)
NAME:  Eric [name], NEECE NO.:  [id number]   MEDICAL RECORD NO.:  [id number]          PATIENT TYPE:  INP   LOCATION:                               FACILITY:  [facility]   PHYSICIAN:  Jene Every, M.D.    DATE OF BIRTH:  [birth year]   DATE OF ADMISSION:  12/13/2008  DATE OF DISCHARGE:                              HISTORY & PHYSICAL   HISTORY:  Mr. Berkovich is a pleasant 77 year old gentleman who presented  to our office with complaints of right lower extremity pain on the L5  nerve root distribution.  Studies revealed facet hypertrophy, foraminal  stenosis as noted on CT myelogram.  The patient underwent a selective L5  nerve root block with only temporary relief of symptoms.  Unfortunately  he noted disabling pain.  It is felt at this point he would benefit from  a lumbar decompression.  The risks and benefits of this were discussed  with the patient, he does elect to proceed.   MEDICAL HISTORY:  1. Significant coronary artery disease, status post CABG x4.  2. Hyperlipidemia.  3. Hypertension.  4. Gastroesophageal reflux disease.  5. COPD.  6. Sick sinus syndrome, status post pacemaker insertion.  7. Prostate cancer.  8. Diabetes.   CURRENT MEDICATIONS:  Niaspan, Nexium, pravastatin, Actos, bumetanide,  Pulmicort, aspirin, amlodipine besylate, Diovan, Plavix, Darvocet  p.r.n., albuterol, CPAP.   ALLERGIES:  METFORMIN AND CELEBREX.   FAMILY HISTORY:  Significant for coronary artery disease.   SOCIAL HISTORY:  Patient has prior tobacco use, quit in 1995.  No  alcohol abuse is noted.   PREVIOUS SURGICAL HISTORY:  1. CABG x4.  2. Hernia repair.  3. Bilateral cataract surgery.   REVIEW OF SYSTEMS:  The patient denies any fever, chills, night sweats  or bleeding tendencies.  CNS:  No blurred or double vision, seizure,  headache or paralysis.  RESPIRATORY:  No shortness of breath, productive  cough or hemoptysis.  CARDIOVASCULAR:  No chest pain, angina or  orthopnea.  GU:  No dysuria, hematuria or discharge.  GI:  No nausea,  vomiting, diarrhea, constipation, melena or bloody stools.  MUSCULOSKELETAL:  Per HPI.   PHYSICAL EXAMINATION:  This is an overweight gentleman.  He does walk  with an antalgic gait in moderate distress.  He does have positive  straight leg raise on the right that produces buttock, thigh and calf  pain, negative on the left.  HEENT: Atraumatic/normocephalic.  Pupils equal, round and reactive to  light; EOMs intact.  NECK:  Supple, no lymphadenopathy.  CHEST:  Clear to auscultation bilaterally.  No rhonchi, wheezes or  rales.  HEART:  Regular rate and rhythm without murmurs, gallops or rubs.  ABDOMEN:  Soft, nontender, nondistended, it is protuberant.  SKIN:  No rashes or lesions are noted.   IMPRESSION:  Spinal stenosis with foraminal stenosis L5 - S1 on the  right.   PLAN:  The patient will be admitted to [facility] to undergo a  lumbar decompression.      Roma Schanz, P.A.      Jene Every, M.D.  Electronically Signed  CS/MEDQ  D:  01/08/2009  T:  01/08/2009  Job:  [id number]

## 2010-10-21 NOTE — Assessment & Plan Note (Signed)
 HEALTHCARE                         ELECTROPHYSIOLOGY OFFICE NOTE   Eric, [name]                     MRN:          [id number]  DATE:02/14/2008                            DOB:          1926/11/05    Eric [name] is seen in followup for pacemaker implantation for tachybrady  syndrome.  He is doing quite well since that time having had less  episodes of lightheadedness.   His energy level has improved.   His medication list is lengthy, is not recounted here.   On examination, his blood pressure is 135/74, the pulse is 73.  The  lungs are clear.  Heart sounds are regular.  Extremities without edema.  The device pocket was well-healed.   Interrogation of his St. Jude device demonstrates a P-wave of 5 with  impedance of 321, threshold 0.5 at 0.4, the R-wave was 5, impedance of  468, threshold 0.75 at 0.4.   There was no significant atrial fibrillation.   IMPRESSION:  1. Chronotropic incompetence.  2. Bradycardia with secondary pacemaker implantation.  3. Diabetes.  4. Coronary artery disease with prior bypass surgery with normal left      ventricular function.   Eric [name] is doing quite well from his device point of view.  We will  plan to see him again in 9 months' time.     Duke Salvia, MD, [facility]  Electronically Signed    SCK/MedQ  DD: 02/14/2008  DT: 02/15/2008  Job #: [id number]   cc:   Lyn Records, M.D.

## 2010-10-21 NOTE — Consult Note (Signed)
NAME:  Eric [name], Eric [name] NO.:  [id number]   MEDICAL RECORD NO.:  [id number]          PATIENT TYPE:  EMS   LOCATION:  MAJO                         FACILITY:  [facility]   PHYSICIAN:  Iva Boop, MD,FACGDATE OF BIRTH:  04/11/1927   DATE OF CONSULTATION:  03/02/2007  DATE OF DISCHARGE:                                 CONSULTATION   CHIEF COMPLAINT:  Burning in chest, nausea, intermittent bloating.   HISTORY OF PRESENT ILLNESS:  Mr. Nealy had called [place] on Friday and was  prescribed Carafate for his epigastric burning.  He has a history of  intermittent bloating and regurgitation.  It has been helped by Xifaxan,  but that has been so costly we have tried other antibiotics including  Cipro and metronidazole which have not been as effective nor has Librarian, academic.  He is now having epigastric burning and belching, had reflux last night  and felt terrible despite taking Carafate four times a day.  His was  brought to the ER.  Full documentation is written in the progress notes.   Essentially his cardiac workup with EKG, CK-MB, troponin and all his  labs were normal.  He has minimal tenderness in the left lower quadrant,  and this is common and stable for him.  He is moving his bowels.   He relates that crowds make him upset and have for a long time.  He is  not really afraid of open spaces or anything like that.   PAST MEDICAL HISTORY:  Reviewed and unchanged.   ASSESSMENT:  I think he has an exacerbation of irritable bowel  plus/minus reflux.   PLAN:  1. Go ahead and treat for bacterial overgrowth with the Xifaxan.  He      knows it is expensive but will take 400 mg t.i.d. for 10 days.  2. Add Librax instead of Levsin for irritable bowel complaints and      anxiety issues.  3. Continue his other medications, though he will probably stop the      Carafate.  4. Increase Nexium to twice a day.   Chest x-ray showed mild pulmonary edema though his lungs are clear.  He  has a  negative abdominal film, i.e., no obstruction or significant  problems there.   He has an appointment pending with me on March 24, 2007.  He is to  keep that.  He will call me sooner if needed.     Iva Boop, MD,FACG  Electronically Signed    CEG/MEDQ  D:  03/02/2007  T:  03/03/2007  Job:  [id number]   cc:   Barbette Hair. Artist Pais, DO

## 2010-10-21 NOTE — Assessment & Plan Note (Signed)
[facility]                         GASTROENTEROLOGY OFFICE NOTE   Eric [name], Eric [name]                     MRN:          [id number]  DATE:03/22/2007                            DOB:          May 26, 1927    CHIEF COMPLAINT:  Followup of gas/bloating problems.   He was seen in the emergency department March 02, 2007; see that  note for further details.  Problem list is reviewed and unchanged from  last note.  See July 21, 2006 for his past medical history.   He has had recurrent spells of vomiting and bloating, etc; it is though  perhaps due to adhesions or perhaps even irritable bowel and bacterial  overgrowth-type problems.  When I saw him in the emergency room, he was  given Librax, which helped quite a bit, but also Xifaxan.  He was  supposed to take Xifaxan 400 mg three times a day for 10 days, but he  has been on it 2 a day.  He feels much better and says after a couple of  days, he really felt quite good.  He went back on his Nexium regularly,  as he had been using that intermittently.  We discontinued his Carafate.  His other medications are listed and reviewed in the chart.   I did spend some time reviewing with him Nexium and diabetes and the  risk for bacterial overgrowth.   PHYSICAL EXAMINATION:  Weight 228 pounds.  Pulse 62, blood pressure  122/60.  ABDOMEN:  Obese.  He is alert and oriented x3.   ASSESSMENT:  1. Recurrent bloating, vomiting and abdominal pain.  He responds      mainly to irritable bowel and/or small bowel bacterial overgrowth      treatment; I think he probably has both.  Adhesions could be      possible, but he really never had a true obstruction in an      objective sense, that is, x-rays negative.   PLAN:  1. Continue Librax t.i.d. p.r.n., one tablet.  2. Continue Nexium for GERD problems.  3. He is given a prescription for Xifaxan 200 mg b.i.d. for 7 days to      be used monthly has needed.  If he is  using it more than a month      and needs it more than a month, he should let me know. This is a      lower dose than is typical, but he seems to respond quite well, so      I have gone with that.   He will see me back as needed.    Iva Boop, MD,FACG  Electronically Signed   CEG/MedQ  DD: 03/22/2007  DT: 03/23/2007  Job #: [id number]

## 2010-10-21 NOTE — Letter (Signed)
Oct 11, 2007    Lyn Records, M.D.  [address]  [address]  [place]  [address]   RE:  JAXSUN, CIAMPI  MRN:  [id number]  /  DOB:  1927-05-17   Dear Erskine Emery,   Mr. Gurka comes in today.  His wound looks pretty good actually.  Not  having seen it last week, I do not have any concerns about it today,  although I realize that he has been on antibiotics for 5 days and that  may be masking something.   He continues to feel much improved following your insight that his  chronotropic competence was contributing to his lassitude.   We will plan to see him again in 3 months for final programming, and  then have him follow up with you.   Thanks very much for allowing [place] to participate.    Sincerely,      Duke Salvia, MD, [facility]  Electronically Signed    SCK/MedQ  DD: 10/11/2007  DT: 10/11/2007  Job #: [id number]

## 2010-10-21 NOTE — Op Note (Signed)
NAME:  Eric [name], Eric [name] NO.:  [id number]   MEDICAL RECORD NO.:  [id number]          PATIENT TYPE:  AMB   LOCATION:  DAY                          FACILITY[facility]   PHYSICIAN:  Jene Every, M.D.    DATE OF BIRTH:  10-17-1926   DATE OF PROCEDURE:  DATE OF DISCHARGE:                               OPERATIVE REPORT   PREOPERATIVE DIAGNOSIS:  Spinal stenosis with foraminal stenosis L5-S1,  right.   POSTOPERATIVE DIAGNOSIS:  Spinal stenosis with foraminal stenosis L5-S1,  right.   PROCEDURE PERFORMED:  1. Lumbar decompression, L5-S1, right with foraminotomy of L5-S1.  2. Partial hemifacetectomy, L5-S1, right.  3. Use of operating microscope.   DEGREE OF DIFFICULTY:  Increased due to the patient's obesity.   BRIEF HISTORY:  This is a 77 year old male with right lower extremities  radiculopathy, L5 nerve root distribution, secondary facet hypertrophy,  foraminal stenosis seen on CT myelogram confirmed by selective 5 nerve  root block which have him temporary relief of symptoms.  Predominant  complaint was right lower extremity radicular pain.  Secondary complaint  was back pain.  We felt that foraminotomy with decompression of the 5  root would relieve the leg pain.  Therefore we decided to proceed with  lumbar decompression.  The risks and benefits were discussed including  bleeding, infection, damage to neurovascular structures, no change in  symptoms, worsening symptoms, persistent back pain, need for fusion in  the future, anesthetic complications, DVT, PE, etc.   TECHNIQUE:  With the patient in supine position.  After induction of  adequate general anesthesia and 2 grams Kefzol, he was placed prone on  the Allisonia frame.  All bony prominences were well-padded.  Lumbar  region prepped and draped in the usual sterile fashion.  A 22-gauge  spinal needle was utilized to localize the 5-1 interspace.  Due to the  patient's obesity, difficulty palpating the spinous  processes.  Incision  was made from spinous process of 5-1.  Subcutaneous tissue was  dissected.  Electrocautery utilized to achieve hemostasis.  Significant  subcutaneous adipose tissue was noted.  Dorsolumbar fascia was  identified and divided in line with the skin incision.  Paraspinous  muscle elevated from lamina of 5 and S1.  Second confirmatory radiograph  obtained.  Hypertrophic facet was noted.  Even with the extra long  McCullough blades, it was still further in depth.  Using osteotome,  removed the inferior medial half of the articulating process of 5 with  the facet.  I then used a 2-mm Kerrison to remove a portion of the  superior articulating facet of S1.  There was significant compression  into the 5 foramen of this process with the ligamentum flavum was  enfolding.   The ligamentum flavum detached from the caudad edge of 5 and cephalad  edge of S1 utilizing straight and microcurette.  Foraminotomies of S1  were performed.  Hemilaminotomy of the caudad edge of 5 was performed.  Through the S1 nerve root, gently mobilized it medially.  Vascular  release was encountered and bipolar cautery was utilized to achieve  hemostasis.  The  ligamentum flavum had enfolded into the foramen as  well.  It was compressing the 5 root.  This was removed.  The patient  had a  hardened disk without disk herniation noted.  The hockey-stick  probe then passed freely down the foramen of S1 and S5 with no  compression of either root.  No evidence of CSF leakage or active  bleeding.  Placed hockey-stick probe cephalad as well.  Some ligamentum  flavum had been removed from the interspace on the lateral aspect.  Inspection revealed no evidence of CSF leaks or active bleeding.  The  wound was copiously irrigated.  Bipolar electrocautery was utilized to  achieve hemostasis.  Did place full __________  to the operative site as  well.  Thrombin-soaked Gelfoam.  Final confirmatory radiograph with the   marker at the foramen of 4 confirmed the level.  All instrumentation was  removed.  Paraspinous muscles irrigated.  Dorsolumbar fascia  reapproximated with #1 Vicryl interrupted figure-of-eight sutures.  Subcutaneous tissue reapproximated with 2-0 Vicryl.  The skin was  reapproximated with staples.  The wound was dressed sterilely.  Placed  supine on hospital bed, extubated without difficulty and transported to  the recovery room in satisfactory condition.   The patient tolerated the procedure with no complications.   ASSISTANT:  Roma Schanz, P.A.   ESTIMATED BLOOD LOSS:  Minimal.      Jene Every, M.D.  Electronically Signed     JB/MEDQ  D:  12/13/2008  T:  12/13/2008  Job:  [id number]

## 2010-10-21 NOTE — Op Note (Signed)
NAME:  Eric [name], DOLLAR NO.:  [id number]   MEDICAL RECORD NO.:  [id number]          PATIENT TYPE:  INP   LOCATION:  2037                         FACILITY:  [facility]   PHYSICIAN:  Duke Salvia, MD, FACCDATE OF BIRTH:  02/12/1927   DATE OF PROCEDURE:  09/20/2007  DATE OF DISCHARGE:                               OPERATIVE REPORT   PREOPERATIVE DIAGNOSIS:  Sinus node dysfunction with chronotropic  incompetence.   POSTOPERATIVE DIAGNOSIS:  Sinus node dysfunction with chronotropic  incompetence.   DESCRIPTION OF PROCEDURE:  After obtaining the informed consent, the  patient was brought to electrophysiology laboratory and placed on the  fluoroscopic table in a supine position.  After routine prep and drape  of the left upper chest, lidocaine was infiltrated in prepectoral  subclavicular region.  The incision was made and carried down to the  level of prepectoral fascia with electrocautery and sharp dissection.  A  pocket was formed.  Hemostasis was obtained.   Thereafter, attention was turned to gain access to the extrathoracic  left subclavian vein, which was accomplished with modest difficulty.  The artery was punctured on one occasion, pressure was held, 2 separate  bleeding punctures were then accomplished.  A guidewire was replaced and  retained and sequentially 7-French sheaths were placed, which were  passed through [facility]. Jude 1688, 58-cm active fixation ventricular lead,  serial # [id number] and a St. Jude 1688T active fixation atrial lead  serial [id number].  Under fluoroscopic guidance, these were manipulated in  the right ventricular apex and the right atrial appendages respectively  where the bipolar R-wave was 8.2 with a pace impedance of 975 with  threshold 0.6 volts at 0.5 milliseconds.  Current threshold is 0.8  __________ pacing at 10 volts and the __________ is brisk.   Bipolar P wave is 4.8 millivolts with pace impedance of 554 ohms with  threshold 0.6  volts at 0.5 milliseconds.  Current threshold is 1.1 and  __________ pacing at 10 volts and the __________ is brisk.  The  ventricular lead was marked with a  __________ .  Leads were secured in  the prepectoral fascia and attached to a St. Jude Zephyr pulse generator  model [id number] serial [id number].  Ventricular pacing and then AV pacing were  identified.  The pocket was copiously irrigated with antibiotic-  containing saline solution.  Hemostasis was assured.  Surgicel was used  on the anterior and posterior aspects of the pocket, and the cephalad  portion the wound was then closed with 3 layers in normal fashion.  The  wound was washed, dried and identified and Steri-strip dressing was  applied.  Needle counts, sponge counts and instrument counts were  correct at the end of the procedure according to the staff.  The patient  tolerated the procedure without apparent complication.      Duke Salvia, MD, [facility]  Electronically Signed    SCK/MEDQ  D:  09/20/2007  T:  09/21/2007  Job:  [id number]   cc:   Electrophysiology Laboratory  [facility]

## 2010-10-21 NOTE — Discharge Summary (Signed)
NAME:  APOLLOS, TENBRINK NO.:  [id number]   MEDICAL RECORD NO.:  [id number]          PATIENT TYPE:  INP   LOCATION:  1402                         FACILITY:  [facility]   PHYSICIAN:  Jene Every, M.D.    DATE OF BIRTH:  01/17/1927   DATE OF ADMISSION:  12/13/2008  DATE OF DISCHARGE:  12/16/2008                               DISCHARGE SUMMARY   ADMISSION DIAGNOSES:  Includes:  1. Significant spinal stenosis at L5-S1.  2. History of coronary artery disease.  3. Hyperlipidemia.  4. Hypertension.  5. Gastroesophageal reflux disease.  6. Chronic obstructive pulmonary disease.  7. Sick sinus syndrome.  8. Prostate cancer.  9. Diabetes.   DISCHARGE DIAGNOSES:  Includes:  1. Significant spinal stenosis at L5-S1.  2. History of coronary artery disease.  3. Hyperlipidemia.  4. Hypertension.  5. Gastroesophageal reflux disease.  6. Chronic obstructive pulmonary disease.  7. Sick sinus syndrome.  8. Prostate cancer.  9. Diabetes.  10.Status post lumbar decompression.  11.Resolved arrhythmia.  12.Resolved ileus.   HISTORY:  Mr. Marriott is a pleasant 77 year old gentleman who presented  to our office with complaints of right lower extremity pain along the L5  nerve root distribution.  Studies revealed facet hypertrophy.  Foraminal  stenosis was noted on CT myelogram.  The patient underwent selective  nerve root block at L5 with temporary relief of symptoms.  Unfortunately, noted persistent disabling pain in the lower extremity.  So at this point, he would benefit from lumbar decompression.  Risks and  benefits of this were discussed.  The patient did elect to proceed.   PROCEDURE:  The patient was taken to the OR, underwent lumbar  decompression at L5-S1.  Surgeon Dr. Jene Every, assistant Roma Schanz, P.A.-C.  Anesthesia general.  Complications none.   CONSULTS:  PT, OT, cardiology, [facility].   HOSPITAL COURSE:  The patient was admitted, taken to  the OR and  underwent the above-stated procedure.  There were no intraoperative  complications, but for preventative measures, anesthesia placed the  patient in step-down unit.  They also had started an A line  preoperatively.  In the unit, we did consult [facility] secondary  to arrhythmia in the PACU.  The patient was evaluated, and they adjusted  his pacemaker with improvement in his arrhythmia at that time.  On  postoperative day #1, the patient was doing fairly well.  He had noted  minimal low back pain.  He did complain of a headache but noted this did  not get worse with sitting or standing.  He fell like he has some 77  congestion.  He denied shortness of breath.  No chest pain or dizziness.  Hemoglobin was stable at 10.5, hematocrit 30.3.  We did adjust  medications.  Cardiology and internal medicine continued follow the  patient.  Later that day, he did develop some nausea and vomiting.  They  felt this was secondary to ileus.  They just adjusted medications.  The  patient did have a bowel movement, and this did resolve.  Over the  course of the next couple days,  the patient continued to do well.  Headache resolved as well as nausea and vomiting.  He progressed nicely  with PT/OT.  Vital signs remained stable.  Labs remained stable.  On  July 11, it was felt the patient was stable to be discharged home, both  orthopedically as well as medically.   DISPOSITION:  The patient discharged home with home health PT/OT and  follow with Dr. Shelle Iron in approximately 10-14 days for suture removal.  Follow up with his cardiologist.   WOUND CARE:  He is to keep this area clean and dry.   ACTIVITY:  He is to walk as tolerated.  No bending, sitting, squatting  or lifting.   DISCHARGE MEDICATIONS:  Includes all home medications include:  1. Norco p.r.n. pain.  2. Vitamin C 500 mg daily.  3. Aspirin 325 mg 1 p.o. daily.  4. Ambien p.r.n. sleep.   CONDITION ON DISCHARGE:   Stable.   FINAL DIAGNOSIS:  Doing well status post lumbar decompression.      Roma Schanz, P.A.      Jene Every, M.D.  Electronically Signed    CS/MEDQ  D:  01/18/2009  T:  01/18/2009  Job:  [id number]

## 2010-10-21 NOTE — Cardiovascular Report (Signed)
NAME:  Eric [name], Eric [name] NO.:  [id number]   MEDICAL RECORD NO.:  [id number]          PATIENT TYPE:  INP   LOCATION:  2037                         FACILITY:  [facility]   PHYSICIAN:  Lyn Records, M.D.   DATE OF BIRTH:  September 09, 1926   DATE OF PROCEDURE:  09/14/2007  DATE OF DISCHARGE:  09/21/2007                            CARDIAC CATHETERIZATION   INDICATIONS FOR PROCEDURE:  Recurrent fatigue and dyspnea on exertion,  prior history of coronary artery bypass grafting, recent stenting of the  circumflex in January 2009.  The study is being done to rule out  progression of disease and/or stent occlusion.   PROCEDURE PERFORMED:  1. Left heart catheterization.  2. Selective coronary angiography.  3. Left ventriculography.  4. Bypass graft angiography.  5. Left internal mammary graft angiography.   DESCRIPTION:  After informed consent, a 6-French sheath was placed in  the right femoral artery using the modified Seldinger technique.  A 6-  [place] A2 multipurpose catheter was then used for hemodynamic  recordings, left ventriculography by hand injection, and selective left  and right coronary angiography and saphenous vein graft angiography.  The 6-French internal mammary catheter was used for left internal  mammary artery angiography.  The patient tolerated the procedure without  complications.   RESULTS:  1. Hemodynamic data:      a.     Aortic pressure 143/54.      b.     Left ventricular pressure 150/21.  2. Left ventriculography:  Left ventricular cavity, size, and function      are normal.  EF is 50%.  No mitral regurgitation.  3. Coronary angiography:      a.     Left main coronary:  Patent, short, no significant       obstructions.      b.     Left anterior descending coronary:  70% mid-vessel disease,       50%-70% ostial LAD disease.      c.     Circumflex artery:  50% proximal, 90% after obtuse marginal       #1, also 90% in obtuse marginal #1, but obtuse  marginal #1 is       supplied by saphenous vein graft.      d.     Right coronary 100% occlusive proximal IV.  4. Bypass graft angiography.      a.     Sequential saphenous vein graft to the obtuse marginal:       Graft is patent, but there is severe nonobstructive diffuse       atherosclerosis from the ostium of this vessel to the distal       anastomoses with stenoses ranging between 50% and 70%.      b.     Saphenous vein graft to the diagonal:  40% ostial narrowing,       50% distal narrowing of the small distal territory.      c.     Saphenous vein graft to the PDA:  This graft is patent but       diffusely diseased with  greater than 50% but less than 70%       proximal and mid stenosis.  5. Left internal mammary graft to the LAD:  Widely patent.   CONCLUSION:  1. The coronary anatomy is not significantly changed from the most      recent cardiac catheterization in January.  2. Low normal LV function.  3. Patent bypass grafts as described.   PLAN:  Increase medical therapy, monitor for evidence of significant  bradycardia and/or chronotropic incompetence which may explain the  patient's symptoms.  Perhaps permanent pacemaker therapy would be  indicated and could improve his functional status if we identify sinus  node dysfunction.      Lyn Records, M.D.  Electronically Signed     HWS/MEDQ  D:  11/24/2007  T:  11/25/2007  Job:  [id number]   cc:   Thora Lance, M.D.

## 2010-10-21 NOTE — Discharge Summary (Signed)
NAME:  Eric [name], Eric [name] NO.:  [id number]   MEDICAL RECORD NO.:  [id number]          PATIENT TYPE:  INP   LOCATION:  3705                         FACILITY:  [facility]   PHYSICIAN:  Sanda Linger, MD       DATE OF BIRTH:  03/06/1927   DATE OF ADMISSION:  03/15/2008  DATE OF DISCHARGE:  03/17/2008                               DISCHARGE SUMMARY   FINAL DIAGNOSES:  1. Partial small bowel obstruction, which is resolving.  2. Chronic anemia, stable.  3. Type 2 diabetes mellitus.  4. History of congestive heart failure and dysrhythmia, stable.   REASON FOR HOSPITALIZATION:  Small bowel obstruction.   PERTINENT LABORATORY DATA:  Includes a white blood cell count of 6.1,  hemoglobin 12.3, hematocrit 36.2, platelet count of 140, differential  shows a slight shift to the left, otherwise unremarkable.  Protime and  INR within normal limits.  Electrolytes are within normal limits.  Glucose is mildly elevated at 114.  His GFR was greater than 60.  LFTs  were within normal limits.  His hemoglobin A1c was 6.0.  Troponins were  all negative.  Lipid profile with a total cholesterol of 120,  triglycerides of 90, HDL low at 25, BLDL normal at 18, LDL 77.  His TSH  was normal at 1.211.   His treatments included an NG tube, which was briefly placed.  It was  quickly able to be terminated, and he improved his activity level and  p.o. intake.  His vital signs remained stable.  His labs did not show  any significant changes.  His condition at the time of discharge is that  he is alert and ambulatory and continues to have good vital signs.  His  p.o. intake is excellent.  Bowel movements are within normal limits.  He  is afebrile and ambulatory.  His instructions at the time of discharge  are medications including Plavix, Norvasc, aspirin, Singulair,  Pravastatin, Diovan, Citalopram, and Actos with no changes.  His  followup will be with Dr. Artist Pais and Dr. Leone Payor within the next 1 to 2  weeks.  His activity will be as tolerated, and he will report any new  symptom development.      Sanda Linger, MD     TJ/MEDQ  D:  05/12/2008  T:  05/12/2008  Job:  [id number]

## 2010-10-21 NOTE — H&P (Signed)
NAME:  Eric [name], Eric [name] NO.:  [id number]   MEDICAL RECORD NO.:  [id number]          PATIENT TYPE:  INP   LOCATION:  6527                         FACILITY:  [facility]   PHYSICIAN:  Michiel Cowboy, MDDATE OF BIRTH:  November 08, 1926   DATE OF ADMISSION:  03/15/2008  DATE OF DISCHARGE:                              HISTORY & PHYSICAL   PRIMARY CARE PHYSICIAN:  Barbette Hair. Artist Pais, DO   HISTORY OF PRESENT ILLNESS:  The patient is a 77 year old gentleman with  past medical history of small bowel obstruction, severe coronary artery  disease status post CABG and last stenting done in January 2009,  presents with nausea, vomiting for the past few days and worsening  abdominal pain/epigastric pain.  Initially, this was thought to be chest  pain, but currently, after further discussion, the patient actually  sustained that he had not been having any particular chest pain per se  or shortness of breath, he is just having a lot of abdominal discomfort.  The patient was admitted for similar complaints about a year ago, and he  did not require a surgical intervention, and his small bowel obstruction  was able to improve with just giving him a pill and having NG tube  placed.  The patient had had hernia repairs bilaterally, but otherwise,  no intra-abdominal surgery in the past.   REVIEW OF SYSTEMS:  Denies any fevers or chills.  Occasional loose  stools, but otherwise, no severe diarrhea, no constipation, no bright  red blood per rectum, no chest pain, shortness of breath.  Able to  ambulate when bothered by his arthritis.   SOCIAL HISTORY:  The patient uses to smoke but quit 15 years ago.  Does  not use drugs or alcohol.   FAMILY HISTORY:  Noncontributory.   PAST MEDICAL HISTORY:  1. Significant for coronary artery disease status post CABG.  2. Asthma.  3. Hypertension.  4. OSA.  5. Hyperlipidemia.  6. History of prostate cancer.  7. History of bradycardia status post pacemaker  placement.  8. History of sleep apnea.  9. History of diabetes.  10.Ischemic cardiomyopathy with diminished systolic function.   ALLERGIES:  Codeine.   MEDICATIONS:  1. Plavix 75 mg p.o. daily.  2. Amlodipine 5 mg p.o. once daily.  3. Aspirin 325 mg once daily.  4. Synthroid 10 mg once daily.  5. Nexium 40 mg daily.  6. Pravastatin 60 mg at bedtime.  7. Diovan 160 at bedtime.  8. Niaspan.  9. Citalopram 40 once daily.  10.Actos 30 mg once daily.   PHYSICAL EXAMINATION:  VITAL SIGNS:  Temperature 97.7, heart rate 60,  respirations 20, blood pressure 164/71, saturating 95% on room air.  GENERAL:  The patient is an obese male, laying down in the bed.  HEENT:  Normocephalic, atraumatic.  Supple.  NECK:  No lymphadenopathy noted.  No JVD, no bruits.  LUNGS:  Clear to auscultation bilaterally, but diminished air movement.  Patient able to take deep breath.  HEART:  Slow but regular, no murmurs, rubs or gallops could be  appreciated.  ABDOMEN:  Distended.  There is  a mild generalized tenderness, but no  peritoneal signs.  No guarding.  No rebound tenderness.  There is  decreased bowel sounds in all four quadrants.  EXTREMITIES:  Lower extremities without cyanosis, clubbing or edema.  NEUROLOGICAL:  Strength 5/5 in all four extremities.  Cranial nerves II-  XII intact.   LABORATORY DATA:  White blood cell count 6.1, hemoglobin 12.9, sodium  140, potassium 4.1, creatinine 1.1, cardiac enzymes within normal  limits.  BNP 75.  UA within normal limits. EKG showing heart rate of 59  with first degree AV block.  CT scan of the abdomen showing partial  small bowel obstructions with transition point, right lower quadrant.  Chest x-ray showing no cardiopulmonary disease.   ASSESSMENT/PLAN:  This is a 77 year old gentleman with recurrent small  bowel obstruction and history of severe coronary artery disease and  heart failure.  1. Small bowel obstruction.  Will make n.p.o.  Put NG tube  to      intermittent suction, gentle IV hydration.  If no significant      improvement, may need to consider surgical intervention.  For right      now, will see if again get better with medical management as it did      before.  2. History of coronary artery disease.  Unfortunately, while the NG      tube was in intermittent suction, will have to hold Plavix and      aspirin.  May need Cardiology guidance, and had a stent placed less      than a year ago, though it has been 10 months, but has history of      in-stent thrombosis in the past per patient, although, I am not      sure if I can find this in the chart.  For right now, have to hold      all p.o. medications.  For Cardiac enzymes, there was a      questionable history of chest pain, although, I doubt that was      truly chest pain as the patient is currently denying it.  Will      admit to telemetry as the patient has had bradycardia in follow up.  3. Hypertension.  Hydralazine p.r.n.  Hold p.o. medications.  4. Hyperlipidemia.  Hold p.o. medications.  5. Prophylaxis, Protonix IV and SCDS.  Avoid Lovenox, because unsure      if the patient will need surgery or not.  6. Diabetes, sliding scale, hold Actos.  Check hemoglobin A1C and      check blood sugars every 4 hours.  7. Sleep apnea, will do a C-Pap.      Michiel Cowboy, MD  Electronically Signed     AVD/MEDQ  D:  03/16/2008  T:  03/16/2008  Job:  [id number]   cc:   Barbette Hair. Artist Pais, DO

## 2010-10-21 NOTE — Cardiovascular Report (Signed)
NAME:  Eric [name], GINGER NO.:  [id number]   MEDICAL RECORD NO.:  [id number]          PATIENT TYPE:  INP   LOCATION:  6527                         FACILITY:  [facility]   PHYSICIAN:  Lyn Records, M.D.   DATE OF BIRTH:  May 08, 1927   DATE OF PROCEDURE:  07/05/2007  DATE OF DISCHARGE:  07/07/2007                            CARDIAC CATHETERIZATION   INDICATIONS FOR PROCEDURE:  Unstable angina and the patient with prior  history of coronary bypass graft in 1995 with LIMA to the LAD, saphenous  vein graft to the intermedius, saphenous vein graft to the PDA, and  saphenous vein graft to the diagonal.   PROCEDURES PERFORMED:  1. Left heart catheterization.  2. Selective coronary angiography.  3. Left ventriculography.  4. Bypass graft angiography.  5. Internal mammary artery angiography.  6. Drug-eluting stent implantation and saphenous vein graft to the      diagonal number #1.   DESCRIPTION:  Because of class IV angina, the patient was admitted to  the hospital.  He consented to having diagnostic cardiac catheterization  and percutaneous coronary intervention performed.  A 6-French sheath was  placed in the right femoral artery using a modified Seldinger technique.  A 6-French, A2 multipurpose catheter was then used for hemodynamic  recordings, left ventriculography by hand injection, and selective  coronary angiography.  This catheter was also used for bypass graft  saphenous vein conduit angiography.  We used the internal mammary artery  catheter for left internal mammary artery angiography.  The patient  tolerated the procedure without significant complications.   Review of the digital images of the raw data demonstrate that there is  high-grade disease in the graft to the diagonal.  This graft contained  proximal in-stent restenosis in the Cypher stent as well as new disease  in the mid body of the graft that had not been previously intervened  upon.  We performed  PCI using an AL1 6-French guide catheter.  The  patient received Angiomax and was loaded with Plavix orally in the cath  lab.  ACT was documented to be greater than 250 seconds.   After engaging the saphenous vein graft with the guide catheter, we  attempted to traverse the tight in-stent restenosis with the filter  wire. We were unable to cross.  We then used an Office manager to  perform predilatation in the region of in-stent restenosis.  A 2.0-mm  balloon was used.  We were then able to advance the filter wire into the  mid body of the graft without significant difficulty and there we  deployed the filter wire.  We then deployed a Taxus 3.0 x 32 mm long  stent.  The stent was deployed at 14 atmospheres and postdilated with a  3.25 balloon, high pressure to 14 atmospheres.  We then removed the  filter wire because of decreased blood flow,  and we treated with  intracoronary adenosine.  We then used a EV3 Spider distal embolic  protection device, 3.0 mm diameter, and advanced into the diagonal  branch itself and deployed this device and over this device  then  advanced a Promus 3.0 x 28 mm long drug-eluting stent.  This stent was  deployed and then postdilated to 14 atmospheres with a 3.25-mm balloon.  The EV3 Spider was then retrieved.  The patient continued to have some  discomfort with slight reduction in flow, and additional doses of  intracoronary adenosine and nitroglycerin were administered.  Flow  increased, symptoms significantly improved, and the case was terminated.   Angio-Seal was used for arteriotomy closure.   RESULTS:  1. Hemodynamic data:  (a)  Aortic pressure 147/56.  (b)  Left ventricular pressure 147/14 mmHg.  1. Left ventriculography:  Left ventricular cavity size and function      are normal.  EF is greater than 50%.  2. Coronary angiography.  (a)  Left main coronary:  Patent.  (b)  Left anterior descending coronary:  The LAD is severely and  diffusely  diseased, giving origin to small diagonal stubs.  (c)  Circumflex artery:  Severely and diffusely diseased with occlusion  of obtuse marginal branches.  (d)  The right coronary:  The right coronary artery is also severely  stenotic and occluded at of the mid vessel.  1. Bypass graft angiography.  (a)  Saphenous vein graft to the obtuse marginal:  Patent with  irregularities noted.  (b)  Saphenous vein graft to the diagonal:  High-grade in-stent  restenosis within a Cypher drug-eluting stent in the proximal vessel and  new 70% to 80% stenosis in the mid body of the graft.  The diagonal is  moderate in size.  (c)  Saphenous vein graft and PDA:  Graft is patent.  1. LIMA to the LAD:  Patent.  2. PCI:  High-grade restenosis and new native disease in the proximal      and mid body of the saphenous vein graft, both reduced to 0% after      overlapping Taxus drug-eluting stents as outlined above with      eventual TIMI grade 3 flow.  The procedure was complicated by      transient no-reflow that was resolved after pharmacologic therapy.   CONCLUSIONS:  1. Unstable angina due to high-grade restenosis and new disease in the      saphenous vein graft to the diagonal, treated successfully with PCI      including Taxus drug-eluting stent and proximal region of in-stent      restenosis overlapped with a Promus stent in the mid body to treat      the new disease.  2. Severe native vessel disease.  3. Patent saphenous vein graft to the PDA, obtuse marginal, and patent      LIMA to the LAD.  4. Normal LV function.   PLAN:  IV nitroglycerin, monitor enzymes, and aspirin and Plavix  indefinitely.  Discharge pending upon the patient's clinical course.      Lyn Records, M.D.  Electronically Signed     HWS/MEDQ  D:  07/26/2007  T:  07/27/2007  Job:  [id number]

## 2010-10-21 NOTE — Discharge Summary (Signed)
NAME:  Eric [name], Eric [name] NO.:  [id number]   MEDICAL RECORD NO.:  [id number]          PATIENT TYPE:  INP   LOCATION:  6527                         FACILITY:  [facility]   PHYSICIAN:  Lyn Records, M.D.   DATE OF BIRTH:  12-Sep-1926   DATE OF ADMISSION:  07/04/2007  DATE OF DISCHARGE:  07/07/2007                               DISCHARGE SUMMARY   REASON FOR ADMISSION:  Unstable angina.   DISCHARGE DIAGNOSES:  1. Coronary atherosclerotic heart disease with unstable angina due to      high-grade obstruction in the saphenous vein graft in the first      diagonal.      a.     Resolved after restenting of restenotic Cypher stent in the       proximal graft and new 99% disease in the mid graft.      b.     Procedure complicated by transient no flow with subsequent       small enzyme leak requiring an additional day of observation in       the hospital.  2. Hypertension.  3. Sinus node dysfunction with bradycardia, asymptomatic.  4. Hyperlipidemia.  5. Prostate cancer.   PROCEDURES PERFORMED:  Cardiac catheterization and PCI of the saphenous  vein graft to the diagonal #1 on July 05, 2007 with drug eluting  stents.   DISCHARGE INSTRUCTIONS:  Medications will include the reinstitution of  Plavix 75 mg per day indefinitely and aspirin 325 mg per day.  His other  medications will be as they were on admission including Singulair 10 mg  per day, Nexium 40 mg per day, pravastatin 40 mg per day, Diovan 160 mg  per day, Niaspan 1000 mg one-half tablet per day, citalopram 40 mg per  day, Actos 30 mg per day, and nitro 0.4 mg sublingual p.r.n.   Office followup with Dr. Katrinka Blazing on July 18, 2007 at 1 p.m..   CONDITION ON DISCHARGE:  Improved and stable.   COMMENTS:  The patient was admitted to the hospital on July 04, 2007  with complaints of angina for  two weeks.  He has history of coronary  atherosclerotic heart disease and undergone coronary artery bypass  grafting in  1995 with LIMA to the LAD saphenous vein graft to the OM #1  and sequentially to the distal circumflex and also saphenous vein graft  to the PDA and saphenous vein graft to the diagonal #1.  The patient  also has known severe native vessel disease.  He underwent drug-eluting  stenting of the saphenous vein graft to the diagonal in 2004.   On this occasion, he presented with increasing nocturnal and early a.m.  angina described as discomfort in the mid portion of his chest and also  recurring left posterior shoulder discomfort both relieved by sublingual  nitroglycerin.  He was admitted to the hospital with class 4 angina and  on July 05, 2007 underwent diagnostic cath, which demonstrated high-  grade restenosis in the proximal saphenous vein graft to the diagonal  within the previously placed Cypher stent and new de-novo high-grade  disease  in the mid body of the graft that was 95%.  Because no other  high-grade obstructions were noted in the other saphenous vein grafts (  although there was moderate degenerative disease in each of the other  saphenous vein grafts), we felt that PCI was a reasonable approach.   We used distal protection to perform this procedure.  The Spider EX was  used and placed in the distal body of the graft after free dilatation of  the 2.0 balloon.  We then deployed a PROMUS 2.028 mm long stent in the  mid body of the graft and then overlapping within the stent and to the  proximal restenotic region we deployed a 3.0 x 32 TAXUS stent.  I chose  to use 2 types of the stents because the previously placed stent in the  proximal vessel had been a sirolimus stent that has much of the same  antiproliferative mechanism as the zotarolimus on the PROMUS stent.   Upon retrieval of the Spider distal protection device, there was  transient reduction in flow that improved significantly after 90 mg of  IV adenosine given in aliquots of 30 mg.   The patient had a subsequent  uncomplicated course.  As expected, there  was a small bump in his cardiac markers likely related to distal  embolization despite the distal protection device that we used.  He had  no difficulty with his groin.   On July 07, 2007, he was felt stable and able to be discharged.  Of  note, his recurring bradycardia with heart rates in the mid 40s and  pauses up to 2.9 seconds and was totallyasymptomatic and occurring  predominantly during sleep.  We had been watching this over the years  and no specific therapy was recommended at this time.      Lyn Records, M.D.  Electronically Signed     HWS/MEDQ  D:  07/07/2007  T:  07/07/2007  Job:  [id number]   cc:   Viviann Spare M.D Iona Hansen, M.D.

## 2010-10-21 NOTE — Cardiovascular Report (Signed)
NAME:  Eric, [name] NO.:  [id number]   MEDICAL RECORD NO.:  [id number]          PATIENT TYPE:  INP   LOCATION:  6527                         FACILITY:  [facility]   PHYSICIAN:  Lyn Records, M.D.   DATE OF BIRTH:  10/10/1926   DATE OF PROCEDURE:  DATE OF DISCHARGE:                            CARDIAC CATHETERIZATION   REASON FOR ADMISSION:  Class IV angina/unstable angina.  The patient  with prior history of coronary artery bypass grafting with CYPHER drug-  eluting stent implantation  and saphenous vein graft to the diagonal in  2004.   PROCEDURE PERFORMED:  1. Left heart catheterization.  2. Selective coronary angiography.  3. Left ventriculography.  4. Bypass graft, saphenous vein angiography.  5. Internal mammary artery graft angiography.  6. PCI of saphenous graft to the diagonal using assisted protection      with the Spider EX.  7. Deployment of Angio-Seal hemostatic device.   DESCRIPTION:  After informed consent, a 6-French sheath was placed on  right femoral artery using the modified Seldinger technique.  A 6-French  A2 multipurpose catheterization was used for hemodynamic recordings,  left ventriculography, left and right coronary angiography, and  saphenous vein graft angiography.  The 6-French internal mammary  catheter was used for left internal mammary artery angiography.  After  reviewing the cineangiogram, it is felt that was saphenous vein graft to  the diagonal was highly diseased, it was the likely source of the  patient's spasm.  He also had degeneration of the saphenous vein graft  to the intermittent and obtuse marginal sequential graft and also vein  graft to the PDA.  No high-grade obstructive lesions were seen.  The  LIMA to the LAD was widely patent, and the native circulation with  severely diseased in all territories.   We decided to perform PCI on saphenous vein graft to the diagonal. We  used a #1 left Amplatz 6-French guiding  catheter.  We gave 600 Plavix, 4  chewable aspirin, and used an Angiomax bolus of 0.75 mg/kg followed by  an infusion of 1.75 mg per hour infusion.  The procedure was  complicated.  The patient initially could not be treated using filter  wire because it would not track through the tight ostial and proximal in-  stent restenosis on the graft.  Because of this, we placed a Prowater  guidewire and performed balloon pre-dilatation using a 2-0 mm Maverick  balloon.  We did this in the ostium and proximal vein graft, and also in  the mid body of the graft where the highest grade stenoses were noted.  We were then able to advance the filter wire into the distal bed, but  from a technical standpoint we were unable to retrieve the sheath off  the filter.  We then took the filter out of the system and decided to  use Spider EX device.  We replaced the Prowater wire down the vessel.  We used 3.0 mm diameter Spider and placed that into the distal saphenous  vein graft anastomosis in the diagonal.  We then were able to remove the  sheath, convert the Spider to a monorail device and then perform  stenting of this in the saphenous vein graft using initially 3.0 x 28 mm  long Terumo stent dilated to 12 atmospheres, two balloon inflations in  the mid graft that had been previously unstented.  We then overlapped  more proximally with this graft a TAXUS 32 mm long 3.0 stent to 14  atmospheres with an effective diameter of 3.25 mm.  I chose to use 2  types of compound as there was already on limus stent restenosis of  the proximal vessel where the previous CYPHER stent had been placed.  We  then retrieved the basket.  We then noticed that there was some  decreased flow after the basket was retrieved.  Three aliquot bolus  doses of adenosine 30 mg were administered on the saphenous vein graft.  This promptly increased the flow in the graft.  No discomfort occurred.  The case was terminated.  Angio-Seal was  then used to achieve  hemostasis.   RESULTS:  1. HEMODYNAMIC DATA:      a.     Aortic pressure 154/57.      b.     Left ventricular pressure 144/14.  2. LEFT VENTRICULOGRAPHY:  Overall LV function is normal.  EF 55%.  3. CORONARY ANGIOGRAPHY:      a.     Left main coronary artery:  Left main coronary artery is       severely diseased, obtaining distal 50% obstruction.      b.     Circumflex artery:  Circumflex coronary artery is patent and       is highly obstructed in the mid first obtuse margin.  The ramus       intermedius branch is totally occluded.  Distal circumflex is       small and free of significant obstruction.      c.     LAD:  LAD contained proximal 90% stenosis of the ostium, and       it is totally occluded on the mid vessel, this is probably because       of the filling from the LIMA to the LAD.  The diagonal is totally       occluded.      d.     Right coronary artery:  Totally occluded in the mid vessel.  4. BYPASS GRAFT ANGIOGRAPHY:      a.     Saphenous vein sequential graft to the ramus and the obtuse       marginal:  Diffuse disease but with no region greater than 50%       diameter obstruction.  TIMI grade 3 flow was noticed.  The distal       vessels are relatively patent and free of significant obstruction       beyond the anastomosis.      b.     Saphenous vein graft diagonal:  This graft is highly       diseased.  The mid body contains high-grade obstruction with new       disease in this region that obstructs the vessel up to 90%.  There       is diffuse in-stent restenosis within the previously placed Cypher       stent from the ostium to the proximal segment.      c.     Saphenous vein graft to the right coronary:  This graft also  contains multiple plaquing.  It is widely patent as well as       anastomosis on to the PDA.  5. LIMA TO THE LAD:  This graft is widely patent with reflux of      contrast both antegrade and retrograde into native  LAD.  6. PCI:  PCI on the graft to the diagonal resulted in significant      improvement in diffuse obstruction noted within the saphenous vein      graft with reduction of stenosis from mid 95% and proximal 50% to -      90% to 0% with TIMI grade 3 flow noted after several aliquots of      intracoronary adenosine.   CONCLUSION:  1. Unstable angina due to high-grade restenosis and progression of      native disease on the saphenous vein graft to the first diagonal.  2. Diffuse disease of the saphenous vein sequential graft to the ramus      and obtuse marginal and also to the PDA but without significant      obstruction.  3. Patent LIMA to the LAD.  4. Severe diffuse native coronary disease with total occlusion of the      mid right, high-grade obstruction and functional occlusion of the      mid LAD,  90% ostial LAD, total occlusion of the ramus intermedius      and high-grade obstruction on the first obtuse marginal.  The      diagonal of the LAD is also totally occluded.  5. Normal LV function.   PLAN:  Plavix for at least a year, aspirin for a year, hope for  discharge within 24 hours.     Lyn Records, M.D.  Electronically Signed    HWS/MEDQ  D:  07/05/2007  T:  07/06/2007  Job:  [id number]

## 2010-10-21 NOTE — H&P (Signed)
NAME:  JEWETT, MCGANN NO.:  [id number]   MEDICAL RECORD NO.:  [id number]          PATIENT TYPE:  INP   LOCATION:  2037                         FACILITY:  [facility]   PHYSICIAN:  Lyn Records, M.D.   DATE OF BIRTH:  [birth year]   DATE OF ADMISSION:  09/12/2007  DATE OF DISCHARGE:                              HISTORY & PHYSICAL   Admit H&P dated September 12, 2007.   CHIEF COMPLAINT:  Chest pain.   HISTORY OF PRESENT ILLNESS:  Eric [name] is a pleasant 77 year old  male patient of Dr. Sherilyn Cooter Smith's with known coronary artery disease,  status post CABG and multiple interventions.  He has had a 1-1/2 to 2-  week history of chest pain located in the mid-sternal area, which will  radiate to the left upper chest area.  Symptoms have occurred at rest,  as well as with activity and are accompanied by shortness of breath.  There are no sweats, nausea or vomiting.  Episodes can last anywhere  from 10 minutes to an hour, and they do respond to nitroglycerin  sublingual.  The patient states the pain is similar to what he had in  the past with his angina.  He also reports increasing dyspnea over the  last few months, as well as increasing fatigue in general.  He tells me  his symptoms have definitely gotten worse in the last 4 to 5 days and on  one occasion had to take 4 nitroglycerin tablets for his pain to  resolve.  He is being admitted for unstable angina.   REVIEW OF SYSTEMS:  As above.  No recent weight gain or loss.  No fever  or chills.  HEENT: Wears glasses.  No difficulty with vision, hearing.  Has had some sinus congestion and recent sore throat.  CHEST:  As above.  Discomfort is not pleuritic, and he has experienced no palpitations or  tachyarrhythmia.  ABDOMEN:  No nausea, vomiting, abdominal pain,  constipation, diarrhea, melena, hematochezia.  PULMONARY:  Recent URI  treated with Augmentin.  Occasional wheeze from that.  Dyspnea for  several months as above.  No  PND, orthopnea.  No cough.  GU:  No  dysuria, frequency, hematuria.  EXTREMITIES:  Continues with edema.  Wears TED hose.  No worsening of his symptoms.  NEURO:  Occasional  headache, no paresthesias, numbness, tingling.  Also denies any  lightheadedness, pre-syncope or syncope.   PAST MEDICAL HISTORY:  1. Coronary artery disease, status post CABG and 1995 nontender.      a.     PCI of the saphenous vein graft to the diagonal.      b.     On July 05, 2007, drug-eluting stent, status post stent       to SVG to diagonal, 2004.      c.     Cardiac catheterization, November 2005.  No lesion, stent in       place.      d.     Myocardial perfusion scan, September 2006.  No evidence for       perfusion defects, EF  64%.  2. Hypertension.  3. Hyperlipidemia.  4. Sinus bradycardia, asymptomatic on beta blockers.  5. Small bowel obstruction with hospitalization, April 2008.  6. Prostate cancer.   PAST SURGICAL HISTORY:  1. CABG, 1995.      a.     LIMA to the LAD.      b.     SVG to the intermediate, OM.      c.     SVG to the PDA.      d.     SVG to the diagonal.      e.     Status post stent to SVG to diagonal, 2004.  2. Hernia repair.   DRUG ALLERGIES:  CODEINE CAUSES NAUSEA.   CURRENT MEDICATIONS:  1. Niaspan 1,000 mg daily.  2. Enteric-coated aspirin 325 ounces daily.  3. Nexium 40 mg a day.  4. Diovan 160 mg b.i.d.  5. Singulair 10 mg a day.  6. Citalopram 40 mg a day.  7. Amlodipine 5 mg day.  8. Pravastatin 20 mg 4 at bedtime daily.  9. Actos 30 mg a day.  10.CPAP at night.  11.O2 at 2 liters in the evening.  12.Bumex 1 mg daily.  13.Albuterol nebulizer q.i.d. p.r.n.   SOCIAL HISTORY:  Prior tobacco use, quit in 1995.  No alcohol abuse.   FAMILY HISTORY:  Positive for coronary artery disease.   OBJECTIVE DATA:  VITAL SIGNS:  His weight is 238, stable pulses 88,  blood pressure 120/60.  GENERAL:  Very pleasant, no acute distress.  SKIN:  Warm and dry.  HEENT:   Ear, nose and throat unremarkable.  NECK:  No JVD.  No carotid bruits were auscultated.  No adenopathy, no  masses.  CARDIOVASCULAR:  Normal S1-S2 without murmur, S3, S4, rub, click or  gallop.  LUNGS:  Good excursion, faint crackles in the right base, otherwise  clear.  ABDOMEN:  Soft, nondistended.  Bowel sounds are present.  No abdominal  tenderness, no bruits auscultated.  No masses or hepatosplenomegaly.  GU/Rectal:  Deferred.  EXTREMITIES:  Moves all extremities x4.  Peripheral pulses +2, radial,  dorsalis pedal pulse.  There is no clubbing, cyanosis or edema.  NEURO:  Cranial nerves II-XII intact.  Gait is steady.  Speech  appropriate.   1. EKG today reveals normal sinus rhythm, rate 76.  There are no and      ST segment changes.  Nonspecific T-wave abnormalities.  2. Cardiac catheterization, July 05, 2007 revealed high-grade      restenosis with progression of native disease on SVG to the first      diagonal, as above to diffuse disease of the saphenous vein,      sequential graft to the ramus obtuse marginal and also to the PDA,      but without significant obstruction.  3. Patent LIMA to the LAD.  4. Severe diffuse native coronary artery disease with total occlusion      of the mid-right, high-grade obstruction and functional occlusion      of the mid-LAD, 90% ostial LAD, total occlusion of the ramus      intermediate and high-grade obstruction on the first obtuse      marginal.  Diagonal of the LAD is also totally occluded.  5. Normal LV function.   IMPRESSION:  1. Chest pain, worrisome for unstable angina.  2. Known coronary artery disease, status post multiple interventions.  3. Hypertension.  4. Hyperlipidemia.  5. Normal LV function by cath January 2009.  PLAN:  Admit to telemetry.  Obtain isoenzymes.  Start nitroglycerin IV  and titrate for pain control.  Start subcu Lovenox.  Dr. Katrinka Blazing to  follow.      Tamera C. Lewis, N.P.      Lyn Records,  M.D.  Electronically Signed    TCL/MEDQ  D:  09/12/2007  T:  09/12/2007  Job:  [id number]

## 2010-10-21 NOTE — H&P (Signed)
NAME:  Eric [name], GEERS NO.:  [id number]   MEDICAL RECORD NO.:  [id number]           PATIENT TYPE:   LOCATION:                                 FACILITY:   PHYSICIAN:  Lyn Records, M.D.   DATE OF BIRTH:  March 08, 1927   DATE OF ADMISSION:  DATE OF DISCHARGE:                              HISTORY & PHYSICAL   CHIEF COMPLAINT:  Chest pain.   HISTORY OF PRESENT ILLNESS:  Tye Vigo is a pleasant 77 year old  gentleman, a patient of Dr. Verdis Prime with known coronary artery  disease, status post CABG in 1995.  He presents with angina symptoms  over the last week, which are worsened in the last 2 days.  At that time  he had chest discomfort on the left anterior chest area that would occur  on and off all day.  It would occur with ambulating mostly.  It would  also be associated with shortness of breath.  Rest would improve it.  He  has had some nausea but no sweats.  Pain would typically last for a  couple of hours and nitroglycerin would only partially relieve it.  Daughter who is with him today states he took 5 or 6 nitroglycerin  tablets 2 days ago and the pain continued with him pretty much all day  long.  The pain has improved but he has continued to have it have it  over the last couple of days and, in fact, has some in the office that  responds to a nitroglycerin.  Five minutes later, however, the chest  pain reoccurs.  The patient tells me he did not have any chest pain  prior to CABG but had some nausea and shortness of breath.  He is being  admitted for unstable angina and probable cardiac catheterization in the  morning.   REVIEW OF SYSTEMS:  GENERAL:  Increased fatigue in the last week.  No  fever or chills.  RESPIRATORY:  As above.  Denies PND or orthopnea.  No  cough.  CHEST:  As above.  Denies tachypalpitations or tachyarrhythmias.  ABDOMEN:  No pain.  Some nausea but no vomiting.  No melena or  hematochezia.  GU:  Denies dysuria, urinary frequency,  hematuria.  EXTREMITIES:  Denies any edema.  NEUROLOGIC:  No headache, numbness,  tingling.  Some lightheadedness but denies presyncope.  No dizziness.   PAST MEDICAL HISTORY:  1. CAD with CABG, 1995.      a.     LIMA to the LAD.      b.     SVG to the intermediate, OM.      c.     SVG to the PDA.      d.     SVG to the diagonal.      e.     Status post stent to SVG to diagonal, 2004.      f.     Cardiac catheterization, November 2005.  No lesion and the       stent in graft.      g.     Myocardial  perfusion scan September 2006, no evidence for       perfusion defects, EF 64%.  2. Hypertension.  3. Hyperlipidemia.  4. Sinus bradycardia on beta blockers, clonidine.  5. Small bowel obstruction with hospitalization, April 2008.  6. Prostate cancer.   PAST SURGICAL HISTORY:  1. CABG as above.  2. Hernia repair.   MEDICATIONS:  1. Niaspan 1000 mg alternating with 2000 mg every other day.  2. Enteric-coated aspirin 325 mg a day.  3. Nexium 40 mg a day.  4. Diovan 160 mg a day.  5. Singulair 10 mg a day.  6. Citalopram 40 mg one p.o. daily.  7. Amlodipine 5 mg a day.  8. Pravastatin 20 mg three tablets p.o. at h.s. daily.  9. Actos 30 mg one a day.  10.CPAP at h.s. daily.   DRUG ALLERGIES:  CODEINE causes nausea.   SOCIAL HISTORY:  Prior tobacco use, quit in 1995.  No alcohol abuse.   FAMILY HISTORY:  Positive for coronary artery disease.   OBJECTIVE DATA:  Weight 241.8, pulse 56, respirations 20, blood pressure  144/58.  GENERAL:  Alert and oriented, in no acute distress.  SKIN:  Warm and dry.  NECK:  No adenopathy.  No JVD.  No carotid bruits auscultated.  CHEST:  Good excursion.  Clear throughout.  CARDIAC:  Slight chest wall tenderness on the left side to palpation but  does not reproduce the pain.  Normal S1-S2 without murmur, S3-S4, rub,  click or gallop.  ABDOMEN:  Protuberant.  Bowel sounds present in all four quadrants.  No  abdominal bruits.  No masses  hepatosplenomegaly.  EXTREMITIES:  Moves all extremities x4.  No clubbing, cyanosis or edema.  Distal +2 pulses in the upper and lower extremities.  NEUROLOGIC:  Cranial II-XII intact.  Gait steady.  Speech appropriate.   EKG reveals nonspecific ST changes, unchanged from prior.  He is  bradycardic with a heart rate of 46.   IMPRESSION:  1. Chest pain, worrisome for angina.  2. History of coronary artery disease with known coronary artery      bypass grafting in the past.  3. Hypertension.  4. Bradycardia.  5. Diabetes mellitus.   PLAN:  Admit.  Check cardiac markers.  IV nitroglycerin for pain  control.  Lovenox subcu.  N.P.O. after midnight for cardiac  catheterization in the morning.      Tamera C. Lewis, N.P.      Lyn Records, M.D.  Electronically Signed    TCL/MEDQ  D:  07/04/2007  T:  07/05/2007  Job:  [id number]

## 2010-10-23 ENCOUNTER — Other Ambulatory Visit (INDEPENDENT_AMBULATORY_CARE_PROVIDER_SITE_OTHER): Payer: Medicare Other

## 2010-10-23 DIAGNOSIS — E785 Hyperlipidemia, unspecified: Secondary | ICD-10-CM

## 2010-10-23 DIAGNOSIS — E119 Type 2 diabetes mellitus without complications: Secondary | ICD-10-CM

## 2010-10-23 LAB — LIPID PANEL
Cholesterol: 100 mg/dL (ref 0–200)
VLDL: 21.8 mg/dL (ref 0.0–40.0)

## 2010-10-23 LAB — HEMOGLOBIN A1C: Hgb A1c MFr Bld: 7 % — ABNORMAL HIGH (ref 4.6–6.5)

## 2010-10-24 NOTE — Assessment & Plan Note (Signed)
[facility]                         GASTROENTEROLOGY OFFICE NOTE   VERLE, WHEELING                     MRN:          [id number]  DATE:08/04/2006                            DOB:          [birth year]    Mr. Brumett feels a little bit better than the last time I saw him with  the bloating and gas and abdominal discomfort.  He is having less  heartburn.  His CT scan showed diverticulosis, but no diverticulitis.  I  did give him a round of Flagyl to see if that would provide some benefit  and he does not think so.  His CBC was essentially normal, except for a  slightly low hematocrit but normal hemoglobin.  His sed rate was 11.  His glucose was 161 (nonfasting).  AST and ALT were mildly elevated at  54 and 43.  Other LFTs were normal.   Medications are listed and reviewed in the chart.   He is trying to get the [place] to approve Nexium twice a day, as he thinks  an extra Nexium helped his vague abdominal discomfort and nausea and  bloating sensation.   PAST MEDICAL HISTORY:  Reviewed and unchanged from my note of July 21, 2006.  Note his nausea and vomiting has subsided.   PHYSICAL:  Height 5 feet 8 inches.  Weight 238 pounds.  Pulse 56.  Blood  pressure 140/90.   ASSESSMENT:  1. Irritable bowel syndrome.  2. Gastroesophageal reflux disease.  3. Abnormal transaminases.  Question medication.  He is on Pravachol      and Niaspan.  Certainly is setup for metabolic syndrome as well.   PLAN:  1. Recheck LFTs.  2. Trial of Align biotic.  3. Note he has hepatic cyst in the liver.  I doubt those are causing      the abnormalities, but that is consideration.  4. Followup. Depending upon his response to trial of Align.  It is      okay to take Nexium twice a day sometimes.  I doubt he would need      that twice daily on a regular basis, but maybe so.  I have given      him some samples to supplement his supply while he asks the [place] for      b.i.d.  Nexium.     Iva Boop, MD,FACG  Electronically Signed    CEG/MedQ  DD: 08/04/2006  DT: 08/04/2006  Job #: [id number]

## 2010-10-24 NOTE — Op Note (Signed)
Eric [name], BERISHA NO.:  [id number]   MEDICAL RECORD NO.:  [id number]                   PATIENT TYPE:  AMB   LOCATION:  DAY                                  FACILITY:  [facility]   PHYSICIAN:  Excell Seltzer. Annabell Howells, M.D.                 DATE OF BIRTH:  03-24-1927   DATE OF PROCEDURE:  02/09/2002  DATE OF DISCHARGE:                                 OPERATIVE REPORT   PREOPERATIVE DIAGNOSIS:  Erectile dysfunction.   POSTOPERATIVE DIAGNOSIS:  Erectile dysfunction.   PROCEDURE:  Implantation of the inflatable penile prosthesis (three-way).   SURGEON:  Excell Seltzer. Annabell Howells, M.D.   RESIDENT:  Crecencio Mc, MD   ANESTHESIA:  General.   ESTIMATED BLOOD LOSS:  25 cc.   DRAINS:  Foley catheter.   COMPLICATIONS:  None.   INDICATION:  The patient is a 77 year old white male, with erectile  dysfunction that has been refractory to other treatment methods.  After  discussing various treatment options, the patient elected to proceed with an  implantation of an inflatable penile prosthesis.  The potential risks and  benefits of this procedure were explained to the patient, and he consented.   DESCRIPTION OF PROCEDURE:  The patient was taken to the operating room, and  a general anesthetic was administered.  The patient was laid supine, prepped  and draped in the usual sterile fashion, and had been administered  preoperative broad-spectrum antibiotics.  The patient's prep did include a  10 minute scrub prior to the prep.  Next, an 77 French Foley catheter was  inserted into bladder, and the bladder was drained.  A transverse  penoscrotal incision, measuring approximately 3 cm was then made.  This was  carried down through the dartos tissue with Bovie electrocautery.  The  corpora cavernosae were identified, and the tissue overlying each corpora  was dissected free.  The left corpora cavernosum was first identified, and 2-  0 PDS holding sutures were placed.  This was also  performed on the  contralateral side.  A #15 blade was then used to make an approximately 2 cm  corporotomy incision on either side.  Attention then turned to placement of  the 75 cc reservoir.  Digital dissection was used to palpate the external  inguinal ring.  A pair of Metzenbaum scissors was then used to pierce the  fascia just superior to the pubis at the pubic tubercle.  The paravesical  space in the space of Retzius was then developed digitally.  The reservoir  was then placed into this space and was inflated with approximately 70 cc of  saline.  Attention then returned to placement of the penile cylinders.  The  Metzenbaum scissors were used to dissect a plane within the corpora  cavernosae.  Each corpora was then dilated serially from 8 to 14 mm with the  serial dilators.  Measurements were then made, and each side  measured  approximately 9 cm proximally and 10 cm distally.  A 14 cm prosthesis was  then chosen with a 3 cm rear tip extender.  The prosthesis was properly  aligned, and the rear tip extender and proximal portion of the cylinder was  placed into the corpora.  The distal end was then brought out the distal  portion of the corpora using a Mellody Dance needle and Furlow device.  Once each  cylinder appeared to be in good position, an artificial erection was  performed which revealed a good erection with no evidence of buckling of the  prosthesis.  Attention then turned to placement of the pump.  A subdartos  pouch was created in the anterior, dependent portion of the right  hemiscrotum.  The pump was then placed in this area.  The tubing was then  connected in the standard fashion using the connecting pieces.  Excess  tubing was removed prior to connection.  The device was then cycled one time  and again, a good erection was obtained.  The device was then deflated.  The  corporotomy incisions were then closed with interrupted 2-0 PDS sutures.  The dartos tissue was then  reapproximated over the tubing with interrupted 3-  0 chromic sutures.  A running 4-0 Vicryl subcuticular stitch was then used  to reapproximate the skin.  A dressing was placed, and the procedure was  ended.  Copious antibiotic solution was used throughout the procedure.  There were no complications, and the patient appeared to tolerate the  procedure well.  He was able to be extubated and transferred to recovery  unit in satisfactory condition.  Please note that Dr. Bjorn Pippin was the  operating surgeon and was present and participated in this entire procedure.     Crecencio Mc, M.D.                          Excell Seltzer. Annabell Howells, M.D.    LB/MEDQ  D:  02/09/2002  T:  02/09/2002  Job:  [id number]

## 2010-10-24 NOTE — H&P (Signed)
NAME:  Eric, [name] NO.:  [id number]   MEDICAL RECORD NO.:  [id number]                   PATIENT TYPE:  INP   LOCATION:  1823                                 FACILITY:  [facility]   PHYSICIAN:  Quita Skye. Waldon Reining, MD             DATE OF BIRTH:  1927/04/04   DATE OF ADMISSION:  04/13/2003  DATE OF DISCHARGE:                                HISTORY & PHYSICAL   HISTORY OF PRESENT ILLNESS:  Eric [name] is a 77 year old white man who  is admitted to [facility] for further evaluation of chest pain.   The patient has a history of coronary artery disease which dates back to  57.  At that time, he underwent coronary artery bypass surgery.  He  received a left internal mammary artery graft to the LAD, a sequential  saphenous vein graft to the intermediate and obtuse marginal, a saphenous  vein graft to the posterior descending artery and a saphenous vein graft to  the diagonal.  He was hospitalized here in August of this year for recurrent  chest pain.  Cardiac catheterization demonstrated diffuse disease in the  saphenous vein graft to the diagonal, both proximally and in the midportion.  Also noted was diffuse disease in the saphenous vein graft to the PDA as  well as a sequential graft to the intermediate and obtuse marginal.  The  left internal mammary artery graft was widely patent.  He had severe native  disease.  Left ventricular wall motion was normal with an ejection fraction  of approximately 60%.  The following day, he was brought back to the cardiac  catheterization laboratory and underwent CYPHER stent deployment in the  saphenous vein graft to the diagonal.   The patient presented to the emergency department this evening after  experiencing an episode of chest pain which began at home.  This occurred  while he was sitting and watching television.  The chest pain was described  as a left anterior pressure.  It did not radiate.  It was  associated with  mild dyspnea but no diaphoresis or nausea.  There were no exacerbating or  ameliorating factors.  It appeared not to be related to position, activity,  meals, or respirations.  He took a total of two nitroglycerin tablets.  Within approximately two hours, it began to wane.  His chest pain has  largely resolved at this time.  The patient believes that the quality of his  pain is similar to the pain which prompted his last hospitalization,  catheterization, and percutaneous coronary intervention.   The patient has a history of hypertension and hyperlipidemia, both of which  are under treatment.  He smoked in the past but stopped approximately nine  years ago.  There is no history of diabetes mellitus.   SOCIAL HISTORY:  The patient lives alone.  He is divorced once and widowed  once.  He does not  work.   FAMILY HISTORY:  His mother died of liver and heart disease.  One sibling  suffered a myocardial infarction.   ALLERGIES:  He is not allergic to any medications.   CURRENT MEDICATIONS:  1. Aspirin 325 mg p.o. daily.  2. Plavix 75 mg p.o. daily.  3. Lisinopril 20 mg p.o. daily.  4. Zocor 20 mg p.o. daily.  5. Nexium 40 mg p.o. daily.   SIGNIFICANT INJURIES:  None.   PAST SURGICAL HISTORY:  Coronary artery bypass surgery as described above.   REVIEW OF SYSTEMS:  Review of systems reveals a history of gastroesophageal  reflux disease and chronic obstructive pulmonary disease.  There are no new  problems related to his head, eyes, ears, nose, mouth, throat, lungs,  gastrointestinal system, genitourinary system, or extremities.  There is no  history of neurologic or psychiatric disorder.  There is no history of  fever, chills, or weight loss.   PHYSICAL EXAMINATION:  VITAL SIGNS:  Blood pressure 142/65.  Pulse 68 and  regular.  Respirations 18.  Temperature 97.4.  GENERAL:  This patient was an older white man in no discomfort.  He was  alert, oriented, appropriate,  and responsive.  HEENT:  Head, eyes, nose, and mouth were normal.  NECK:  The neck was without thyromegaly or adenopathy.  Carotid pulses were  palpable bilaterally and without bruits.  CARDIAC:  Examination revealed a normal S1 and S2.  There was no S3, S4,  murmur, rub, or click.  Cardiac rhythm was regular.  No chest wall  tenderness was noted.  LUNGS:  The lungs were clear.  ABDOMEN:  The abdomen was soft and nontender.  There was no mass,  hepatosplenomegaly, bruit, distention, rebound, guarding, or rigidity.  Bowel sounds were normal.  RECTAL/GENITALIA:  Examinations were not performed as they were not  pertinent to the reason for acute care hospitalization.  EXTREMITIES:  The extremities were without edema, deviation, or deformity.  Radial and dorsalis pedal pulses were palpable bilaterally.  NEUROLOGICAL:  Brief screening neurologic survey was unremarkable.   LABORATORY DATA:  The electrocardiogram revealed normal sinus rhythm with  sinus arrhythmia.  Incomplete right bundle branch block was noted.  The  possibility of a prior anterior myocardial infarction could not be excluded.   Potassium was 4.1, BUN 11, and creatinine 0.9.  The initial set of cardiac  markers revealed a myoglobin of 72.1, CK-MB 1.8, troponin less than 0.05.   The chest radiograph was pending at the time of this dictation.   The remaining laboratory studies were pending at the time of this dictation.   IMPRESSION:  1. Chest pain; rule out unstable angina.  2. Coronary artery disease, status post coronary artery bypass surgery in     1995 (detailed above), status post stent in August 2004 in the saphenous     vein graft to the diagonal.  3. Hypertension.  4. Hyperlipidemia.  5. Chronic obstructive pulmonary disease.  6. Gastroesophageal reflux.   PLAN:  1. Telemetry.  2. Serial cardiac enzymes.  3. Aspirin.  4. Heparin.  5.     Nitrol paste. 6. Plavix.  7. Further measures per Dr.  __________.                                                Quita Skye. Waldon Reining, MD    MSC/MEDQ  D:  04/13/2003  T:  04/13/2003  Job:  [id number]   cc:   Lesleigh Noe, M.D.  [address]  [address]  [place]  [place] [address]  Fax: [phone]

## 2010-10-24 NOTE — Consult Note (Signed)
NAME:  Eric [name], Eric [name] NO.:  [id number]   MEDICAL RECORD NO.:  [id number]          PATIENT TYPE:  INP   LOCATION:  5504                         FACILITY:  [facility]   PHYSICIAN:  Lyn Records, M.D.   DATE OF BIRTH:  21-Dec-1926   DATE OF CONSULTATION:  09/13/2006  DATE OF DISCHARGE:                                 CONSULTATION   REASON FOR CONSULTATION:  Asymptomatic bradycardia.   CONCLUSIONS:  1. Sinus bradycardia with heart rates in the low 40s, high 30s,      probably multifactorial in etiology including secondary to      medications (beta-blocker therapy, clonidine therapy), intrinsic      sinus node dysfunction, and possible neurally-mediated component      secondary to in-dwelling NG tube.  2. Coronary atherosclerotic heart disease with prior coronary artery      bypass graft in 1995, and PCI in 2004.  3. Difficult to control blood pressure with intolerance to several      different medications and unusual chronic medication regimen      including Diovan 160 mg twice a day, hydralazine 100 mg twice a      day, metoprolol 25 mg b.i.d., and bumetanide 1 mg per day.  Other      medications include Nexium, niacin, and metformin.  4. The patient was admitted to the hospital with nausea, vomiting, and      abdominal discomfort due to possible small bowel obstruction.   RECOMMENDATIONS:  1. Would discontinue beta-blocker therapy for the time being.  2. Would hold clonidine.  3. Continue hydralazine and also when possible place amlodipine per NG      tube.  4. Can give diuretic therapy intravenously.  5. Check TSH.  6. Will follow with you.  7. EKG in the a.m.   COMMENTS:  Eric [name] is a 77 year old gentleman with a history of  vascular disease with prior coronary artery bypass surgery and PCI, who  was admitted to the hospital over the weekend with nausea, vomiting, and  small bowel obstruction.  Since admission and, I believe,  because of  the addition of  Catapres to low dose beta-blocker therapy in this  patient with probable underlying sinus node dysfunction, he has  developed asymptomatic significant bradycardia.  He states that as an  outpatient he has had some graying of vision.  He has not had syncope,  however.  His slowest heart rates have occurred at night with heart  sounds in the mid to low 30s.  There has been no chest discomfort,  orthopnea, PND, or signs or symptoms of heart failure.   MEDICATIONS:  Listed above.   FAMILY HISTORY:  Noncontributory.   ALLERGIES:  HE HAS INTOLERANCE TO  1. HYDROCHLOROTHIAZIDE BECAUSE OF HYPONATREMIA.  2. MORPHINE CAUSES NAUSEA.   PHYSICAL EXAMINATION:  VITAL SIGNS:  The patient's blood pressure is  moderately elevated in the 160/70 range, heart rate is 50.  HEENT:  Unremarkable.  He has an NG tube in place.  No carotid bruits.  LUNGS:  Clear.  CARDIAC:  Reveals bradycardia, otherwise unremarkable.  ABDOMEN:  Nontender.  EXTREMITIES:  No edema.   EKG, on the chart, reveals a sinus bradycardia but no acute ST-T wave  change.   LABORATORY DATA:  Of significance includes sodium of 140, BUN and  creatinine of 22 and 1.  Hemoglobin is 11.3.  No BNP has been obtained.  TSH is pending.   DISCUSSION:  The patient has multiple reasons for bradycardia but in  this case I believe he has underlying intrinsic sinus node dysfunction  because we have had bradycardia difficulties as an outpatient.  In  addition, the combination of beta-blocker and clonidine have further  slowed the patient's heart rate.  There may be also a neurally-mediated  component secondary to the NG tube stimulating a vagal type reaction.  We should hold clonidine and beta-blocker therapy, monitor the patient  closely, add other antihypertensive therapies as needed to help with  blood pressure control.  Right now, we are somewhat limited at what we  can use because of previous known intolerances and the patient's NPO   status.      Lyn Records, M.D.  Electronically Signed     HWS/MEDQ  D:  09/13/2006  T:  09/13/2006  Job:  [id number]   cc:   L. Lupe Carney, M.D.  Titus Dubin. Alwyn Ren, MD,FACP,FCCP

## 2010-10-24 NOTE — H&P (Signed)
NAME:  Eric, [name] NO.:  [id number]   MEDICAL RECORD NO.:  [id number]          PATIENT TYPE:  EMS   LOCATION:  MAJO                         FACILITY:  [facility]   PHYSICIAN:  Lyn Records, M.D.   DATE OF BIRTH:  1927-03-12   DATE OF ADMISSION:  02/16/2005  DATE OF DISCHARGE:                                HISTORY & PHYSICAL   HISTORY OF PRESENT ILLNESS:  Eric [name] is a 77 year old white man who  is again admitted to [facility] for further evaluation of chest  pain.   The patient has a history of coronary artery disease which dates back to  79.  At that time, he underwent coronary artery bypass surgery.  He  received a left internal mammary artery graft to the left anterior  descending, a sequential saphenous vein graft to the intermediate and obtuse  marginal, a saphenous vein graft to the posterior descending artery, and a  saphenous vein graft to the diagonal.  He was hospitalized again in August  of 2004 for recurrent chest pain.  Cardiac catheterization demonstrated  diffuse disease in the saphenous vein graft to the diagonal, both proximally  and in the mid-portion.  Also noted was diffuse disease in the saphenous  vein graft to the PDA as well as the saphenous vein graft to the  intermediate and obtuse marginal.  The left internal mammary artery graft  was widely patent.  He had severe native disease.  Left ventricular wall  motion was normal with an ejection fraction of approximately 60%.  He then  underwent stenting to the saphenous vein graft to the diagonal.  He returned  in November of 2004 for recurrent chest pain.  Cardiac catheterization  demonstrated no obstructing lesions.   The patient returned to the emergency department this evening for chest  pain; this occurred at approximately 5 p.m.  The chest pain was described as  a substernal burning.  It radiated to the epigastrium.  It also spread out  across his anterior chest.  It  was not associated with dyspnea, diaphoresis,  or nausea.  There were no exacerbating or ameliorating factors.  It appeared  to be unrelated to exertion, activity, meals, or respirations.  He is  uncertain as to whether not this chest pain was similar in quality to that  which preceded his bypass operation or his stent.  He took several  nitroglycerin tablets, but is unsure if they had an effect on his pain.  He  presented to the emergency department, and hence he is admitted for further  evaluation.  His chest pain has largely resolved at this time.  His total  duration of chest pain was approximately 8 hours.  He is free of chest pain  and otherwise asymptomatic at this time.   The patient has a history of hypertension and dyslipidemia.  He smoked in  the past, but stopped approximately 10 years ago.  There is no history of  diabetes.  There is a family history of coronary artery disease.   PAST MEDICAL HISTORY:  The patient's only other medical problems  are those  of the chronic obstructive pulmonary disease and gastroesophageal reflux.   MEDICATIONS:  Metoprolol, Nexium, Diovan, aspirin, and hydrochlorothiazide.   ALLERGIES:  None.   OPERATIONS:  Coronary artery bypass surgery.   SOCIAL HISTORY:  The patient lives alone.  He is divorced once and widowed  once.  He does not work.   FAMILY HISTORY:  His mother died of liver disease and heart disease.  One  sibling suffered a myocardial infarction.   SIGNIFICANT INJURIES:  None.   REVIEW OF SYSTEMS:  Review of systems reveals no new problems related to his  head, eyes, ears, nose, mouth, throat, lungs, gastrointestinal system,  genitourinary system, or extremities.  There is no history of neurologic or  psychiatric disorder.  There is no history of fever, chills or weight loss.   PHYSICAL EXAMINATION:  VITAL SIGNS:  Blood pressure 169/68.  Pulse 46 and  regular.  Respirations 22.  Temperature 97.2.  GENERAL:  The patient was  an elderly white man in no discomfort.  He was  alert, oriented, appropriate, and responsive.  HEENT:  Head, eyes, nose, and mouth were normal.  NECK:  The neck was without thyromegaly or adenopathy.  Carotid pulses were  palpable bilaterally and without bruits.  CARDIAC:  Examination revealed a normal S1 and S2.  There was no S3, S4,  murmur, rub, or click.  Cardiac rhythm was regular.  No chest wall  tenderness was noted.  LUNGS:  The lungs were clear.  ABDOMEN:  The abdomen was soft and nontender.  There was no mass,  hepatosplenomegaly, bruit, distention, rebound, guarding, or rigidity.  Bowel sounds were normal.  RECTAL AND GENITAL:  Examinations were not performed as they were not  pertinent to the reason for acute care hospitalization.  EXTREMITIES:  The extremities were without edema, deviation, or deformity.  Radial and dorsalis pedal pulses were palpable bilaterally.  NEUROLOGIC:  Brief screening neurologic survey was unremarkable.   LABORATORY AND ACCESSORY CLINICAL DATA:  Electrocardiogram revealed sinus  bradycardia.  Tracing revealed no changes for ischemia or infarction.  Incomplete right bundle branch block was noted.   Chest radiograph had not yet been performed at the time of this dictation.   The initial set of cardiac markers revealed a myoglobin of 104, CK-MB 1.1  and troponin less than 0.05.  The second set of cardiac markers revealed a  myoglobin of 204, CK-MB 1.2, and troponin less than 0.05.  Potassium was  4.0, BUN 14, and creatinine 1.0.  White count was 5.8 with a hemoglobin of  12.5 and hematocrit of 36.2.  The remaining studies were pending at the time  of this dictation.   IMPRESSION:  1.  Chest pain; rule out myocardial infarction, rule out angina.  2.  Coronary artery disease.  Status post coronary artery bypass surgery in      1995 (details above).  Status post stent to saphenous vein graft to the     diagonal in 2004.  Last cardiac catheterization  in November 2005      revealed no obstructive lesion and grafts and stent patent.  3.  Hypertension.  4.  Dyslipidemia.  5.  Chronic obstructive pulmonary disease.  6.  Gastroesophageal reflux.   PLAN:  1.  Telemetry.  2.  Serial cardiac enzymes.  3.  Aspirin.  4.  Intravenous heparin.  5.  Intravenous nitroglycerin.  6.  Metoprolol.  7.  Further measures per Dr. Katrinka Blazing.      Quita Skye  Waldon Reining, MD      Lyn Records, M.D.  Electronically Signed    MSC/MEDQ  D:  02/16/2005  T:  02/16/2005  Job:  [id number]

## 2010-10-24 NOTE — Consult Note (Signed)
NAME:  Eric [name], Eric [name] NO.:  [id number]   MEDICAL RECORD NO.:  [id number]          PATIENT TYPE:  INP   LOCATION:  3733                         FACILITY:  [facility]   PHYSICIAN:  Garnetta Buddy, M.D.   DATE OF BIRTH:  Oct 10, 1926   DATE OF CONSULTATION:  02/16/2005  DATE OF DISCHARGE:                                   CONSULTATION   This is a 77 year old white male with a history of a long-standing  asymptomatic hyponatremia.  He, in reviewing his serum sodium, has been less  than 135 since August 2004.  He was admitted by Dr. Verdis Prime for acute  coronary syndrome and was treated with intravenous heparin.  He has no  history of seizures, muscle weakness, muscle pain.  One week ago, he was  treated with p.o. Cipro for gastroenteritis by PCP.  He has no history of  non-steroidal anti-inflammatories, loss of consciousness, syncope,  presyncope.  No history of hypothyroidism, breast, brain, or lung masses.   PAST MEDICAL HISTORY:  1.  Hypertension.  2.  Coronary artery disease status post CABG in 1995.  3.  COPD.  4.  Hyperlipidemia.   MEDICATIONS:  Aspirin 1 tablet daily, Avapro 150 mg daily, Lopressor 125 mg  b.i.d., Protonix 40 mg b.i.d., K-Dur 20 mEq daily.   ALLERGIES:  Demerol, hydrocodone APAP, cortisone.   SOCIAL HISTORY:  Divorced, lives alone.   FAMILY HISTORY:  Mother died of heart disease.  One sibling myocardial  infarction.   REVIEW OF SYMPTOMS:  GENERAL:  Denies fatigue, fever, sweats, chills.  EYES:  Denies visual complaints, eye pain.  EARS, NOSE AND THROAT:  No hearing  loss, sore throat.  CARDIOVASCULAR:  Admits to chest pain.  Mild lower  extremity swelling.  No orthopnea, PND, palpitations, or syncopal spells.  RESPIRATORY SYSTEM:  No cough, wheeze, or hemoptysis.  ABDOMINAL SYSTEM:  Denies abdominal pain.  Positive for nausea, positive for diarrhea.  UROGENITAL:  No urgency, frequency, or dysuria.  NEUROLOGIC:  No seizures,  no gout, no  NSAIDs, no muscle aches.   PHYSICAL EXAMINATION:  GENERAL:  Alert, comfortable gentleman in no obvious distress.  VITAL SIGNS:  Blood pressure 160/90, pulse 90, temperature afebrile.  HEAD AND EYES:  Normocephalic, atraumatic, pupils round, equal, and  reactive.  EARS, NOSE AND THROAT:  External appearance normal.  Nasal mucosa clear.  Oropharynx clear.  NECK:  Supple, no thyromegaly, no adenopathy, no JVD.  No carotid bruits.  CARDIOVASCULAR:  No heaves, thrills, rubs, or gallops.  Regular rate and  rhythm  RESPIRATORY SYSTEM:  Lung fields are clear to auscultation, no wheezes or  rales.  ABDOMEN:  Soft, nontender, bowel sounds present, no organosplenomegaly.  EXTREMITIES:  No cyanosis, clubbing, and edema.  NEUROLOGIC:  Reflexes symmetric and brisk.  Alert and oriented x 3.  Sensation normal.   LABORATORY DATA:  Sodium 127, potassium 3.3, chloride 96, glucose 102, SGOT  22, SGPT 20, albumin 3.8, calcium 8.9, alkaline phos 35.   ASSESSMENT AND PLAN:  Rule out syndrome of inappropriate antidiuretic  hormone, no evidence of volume excess or volume  depletion.  His hyponatremia  is probably exacerbated by his use of heparin.  Avoid non-steroidals,  diuretics, check TSH and cortisol levels.      Garnetta Buddy, M.D.  Electronically Signed     MWW/MEDQ  D:  02/16/2005  T:  02/16/2005  Job:  [id number]

## 2010-10-24 NOTE — Op Note (Signed)
NAME:  ADD, DINAPOLI NO.:  [id number]   MEDICAL RECORD NO.:  [id number]          PATIENT TYPE:  OIB   LOCATION:  6532                         FACILITY:  [facility]   PHYSICIAN:  Corky Crafts, MDDATE OF BIRTH:  1927/05/02   DATE OF PROCEDURE:  03/13/2005  DATE OF DISCHARGE:                                 OPERATIVE REPORT   PROCEDURE:  Abdominal aortogram with bilateral selective renal angiogram.   OPERATORS:  Corky Crafts, M.D. and Salvadore Farber, M.D.   ANESTHESIA:  Versed and fentanyl.   FINDINGS:  Abdominal aorta shows single renal artery supplying both kidneys.  There is mild atherosclerosis of the abdominal aorta.  There is also mild  atherosclerosis noted of the common iliac arteries bilaterally.  There did  not seem to be any hemodynamically-significant lesions in either vessel.  The selective angiogram on the right renal artery showed mild  atherosclerosis of the right renal ostium.  There was no pressure gradient.  On the left renal artery, there was a 40% ostial left renal artery stenosis  with a 10-mm pressure gradient when measured with a 5-French catheter.   IMPRESSION:  1.  No significant renal artery stenosis.  2.  The patient with significant hypertension.   RECOMMENDATIONS:  1.  Continue blood pressure management.  2.  His sheath will be removed using manual compression.   COMPLICATIONS:  There were no apparent complications.   ESTIMATED BLOOD LOSS:  Minimal.           ______________________________  Corky Crafts, MD     JSV/MEDQ  D:  03/13/2005  T:  03/14/2005  Job:  [id number]

## 2010-10-24 NOTE — H&P (Signed)
NAME:  Eric [name], Eric [name] NO.:  [id number]   MEDICAL RECORD NO.:  [id number]          PATIENT TYPE:  INP   LOCATION:  5504                         FACILITY:  [facility]   PHYSICIAN:  Titus Dubin. Hopper, MD,FACP,FCCPDATE OF BIRTH:  [birth year] 1928   DATE OF ADMISSION:  09/11/2006  DATE OF DISCHARGE:                              HISTORY & PHYSICAL   Eric [name] is a 77 year old diabetic who presents with nausea and  vomiting up to eight to ten times over the last 24 hours.  He did not  come to the emergency room as his wife thought it might stop.  With  episodes, he would have diaphoresis.  He denies chills or fever.  He has  had no diarrhea, hematemesis, or melena.  The emesis was described as  yellow.  With the vomiting, he has had epigastric pain.   His past medical history is long and complicated.  He has:  1. Coronary artery disease and has had bypass grafting and stenting.  2. He has also had a hernia repair.  3. He has a history of prostate cancer.  4. He also has a history of diverticulosis and colonoscopy.  5. Additionally, he has esophageal reflux.  6. He has had bilateral cataract surgery.   He underwent selective renal angiography in October 2006 which did not  reveal renal artery stenosis.  He was hospitalized in September 2006  with chest pain; myocardial infarction was ruled out.  Cardiolite study  at that time was negative for ischemia, and ejection fraction was  normal.  During that hospitalization, he was found to have hyponatremia  which was attributed to hydrochlorothiazide, a medication which was not  identified at the time of admission.   He also has a diagnosis of obstructive lung disease.  He quit smoking in  1995.  He does not drink.   Morphine causes nausea and vomiting.   His mother died of liver disease but also had coronary artery disease.  A sibling had myocardial infarction.  There is no family history of  gastrointestinal disease.   He is  presently on:  1. Diovan 160 mg twice a day.  2. Hydralazine 100 mg twice a day.  3. Metformin 1000 mg twice a day.  4. Niacin 1000 mg twice a day.  5. Nexium 40 mg daily.  6. Ecotrin 325 mg daily.  7. Metoprolol 25 mg twice a day.  8. Bumetanide 1 mg once a day.  9. Citalopram 20 mg daily.   He denies headaches or epistaxis.  He has had no cough, shortness of  breath, or sputum production.  He has had no angina recently.  He denies  dysuria, hematuria, or pyuria.  He also denies polyuria, polydipsia, or  polyphagia.  He has had no skin lesions which are nonhealing.  He is due  for an eye examination in August of this year.  He denies any new  paresthesias.  He has arthritis in the knees but denies the intake of  NSAIDs.  At this time, he is comfortable with placement of  an NG-tube.   PHYSICAL EXAMINATION:  Blood pressure was 158/86, pulse 74, respiratory  rate 24, and temperature 96.8.  He has no scleral icterus or jaundice.  Arteriolar narrowing is present.  His lenses are clear with a probable history of prior cataract surgery  bilaterally.  He has bilateral hearing aids.  He has complete dentures.  Oropharynx reveals mild erythema.  Nares are patent.  He has no lymphadenopathy about the head, neck, or axilla.  Thyroid is substernal.  Heart sounds are somewhat distant with a flow murmur.  There is no increased work of breathing.  Abdomen is distended but soft.  Bowel sounds are markedly decreased.  Pedal pulses are decreased at present.  He has no edema.  Denna Haggard' sign  is negative.  He moves all extremities.  There are no localizing neuropsychiatric deficits except for a decrease  in auditory acuity.  Mild degenerative joint changes are noted.   Urinalysis revealed rare bacteria and 0 to 2 red blood cells and 3 to 6  white cells.  Chemistries were normal.  GFR was greater than 60.  White  count was 12.3, hematocrit was 42 with a slight left shift.   Films suggest small  bowel obstruction.   He will be admitted with NG to low Gomco.  Electrolytes will be  monitored.  He will be kept on telemetry.  Diet will be advanced as  tolerated.  Oral medicines will be held; he will be placed on Lovenox  prophylaxis.  He will receive a clonidine patch and IV medications for  blood pressure control.  He will be on a sliding-scale insulin with  NovoLog q 6 hours.      Titus Dubin. Alwyn Ren, MD,FACP,FCCP  Electronically Signed     WFH/MEDQ  D:  09/11/2006  T:  09/12/2006  Job:  [id number]   cc:   Barbette Hair. Artist Pais, DO  Lyn Records, M.D.

## 2010-10-24 NOTE — Cardiovascular Report (Signed)
NAME:  Eric [name], SOX NO.:  [id number]   MEDICAL RECORD NO.:  [id number]                   PATIENT TYPE:  INP   LOCATION:  3732                                 FACILITY:  [facility]   PHYSICIAN:  Lesleigh Noe, M.D.            DATE OF BIRTH:  [birth year]   DATE OF PROCEDURE:  01/15/2003  DATE OF DISCHARGE:                              CARDIAC CATHETERIZATION   REASON FOR CATHETERIZATION:  Symptoms somewhat atypical for angina but the  patient presented over the weekend and was admitted.  This study is being  done to rule out bypass graft occlusive disease, history of coronary artery  bypass graft in 1995 with a LIMA to the LAD, saphenous vein graft to the  diagonal, saphenous vein graft sequential to IM and OM, and saphenous vein  graft to the PDA.   PROCEDURE PERFORMED:  1. Left heart catheterization.  2. Selective coronary angiogram.  3. Left ventriculography.  4. Internal mammary graft angiography.  5. Saphenous vein graft angiography.   DESCRIPTION OF PROCEDURE:  After informed consent, a 6-French sheath was  placed in the right femoral artery using the modified Seldinger technique.  A 6-French A2 multipurpose catheter was used for hemodynamic recordings,  left ventriculography by hand injection, left coronary angiography,  saphenous vein graft angiography.  The internal mammary catheter was used  for a right coronary native vessel angiography and also for left internal  mammary artery angiography.  The patient tolerated the procedure without  significant complications.   RESULTS:   I. HEMODYNAMIC DATA:  A.  Aortic pressure 126/50.  B.  Left ventricular pressure 126/15.   II. LEFT VENTRICULOGRAPHY:  The left ventricle demonstrates overall normal  LV function.  EF 60%.   III. SELECTIVE CORONARY ANGIOGRAPHY:  A.  Left main coronary:  The left main  coronary artery is widely patent.  B.  Left anterior descending coronary:  The LAD  contains a 99% ostial  narrowing.  The LAD beyond the mid vessel fills competitively by LIMA to the  distal LAD.  C.  Circumflex artery:  The ramus intermedius branch is 100% occluded.  The  first obtuse marginal branch contains 99% narrowing.  Both the ramus  intermedius branch and the first obtuse marginal fill competitively by  saphenous vein graft.  D.  Right coronary:  Totally occluded in the proximal region.   IV. SAPHENOUS VEIN GRAFT SEQUENTIAL TO THE INTERMEDIATE AND OBTUSE MARGINAL:  A.  This graft contains diffuse irregularities, but no high grade focal  obstruction.  The bypassed branches are free of significant obstruction.  B.  Saphenous vein graft to the diagonal:  This graft is diffusely diseased.  There is a focal 99% stenosis mid vessel, 50% stenosis distal, 50% narrowing  at the junction with the distal anastomosis.  The distal vessel is  relatively small in caliber.  C.  Saphenous vein graft to the PDA:  This  graft is diffusely diseased.  Contains 60% mid graft narrowing.  It terminates on the PDA.  There is  diffuse native vessel disease.   V. LIMA TO THE LAD:  Widely patent with reflux of contrast into the mid LAD.   CONCLUSION:  1. Bypass graft failure with 99% stenosis and 70% stenosis in the proximal     and mid portion of the graft to the diagonal.  There is 50% stenosis at     the distal anastomosis site.  There is diffuse disease in the saphenous     vein graft to the posterior descending artery and in the sequential graft     to the intermediate and obtuse marginal.  The left internal mammary     artery to the left anterior descending is widely patent.  There is     segmental disease in the left anterior descending proximal to the graft     insertion site.  2. Normal left ventricular function.  3. Severe native vessel coronary disease with 99% ostial left anterior     descending, 70% segmental mid vessel disease, total occlusion of the     ramus, 80-90%  obstruction in the obtuse marginal, total occlusion of the     right.  4. Overall normal left ventricular function.   PLAN:  Consider PCI on the saphenous vein graft to the diagonal with distal  protection.  May be enrolled in the BLAZE or one of the other distal  protection trials.                                               Lesleigh Noe, M.D.    HWS/MEDQ  D:  01/15/2003  T:  01/15/2003  Job:  [id number]   cc:   Bryan Lemma. Manus Gunning, M.D.  [address] [place]  [place] [address]  Fax: [phone]

## 2010-10-24 NOTE — Cardiovascular Report (Signed)
   NAME:  Eric [name], Eric [name] NO.:  [id number]   MEDICAL RECORD NO.:  [id number]                   PATIENT TYPE:  INP   LOCATION:  6525                                 FACILITY:  [facility]   PHYSICIAN:  Lesleigh Noe, M.D.            DATE OF BIRTH:  February 23, 1927   DATE OF PROCEDURE:  01/16/2003  DATE OF DISCHARGE:                              CARDIAC CATHETERIZATION   INDICATIONS:  High grade disease of the saphenous vein graft to the second  diagonal.   PROCEDURE PERFORMED:  1. Drug eluting Cypher stent implantation into the proximal portion of the     saphenous vein graft to the second diagonal.  2. Distal protection with the EZ small part of the BLAZE registry.   DESCRIPTION OF PROCEDURE:  After informed consent, a 7-French sheath was  placed in the right femoral artery using the modified Seldinger technique.  Guide shots were obtained with a 7-French left coronary bypass catheter.  Because of poor guide catheter support we ultimately used a #2 left Amplatz  7-French catheter.  The patient received a bolus followed by an infusion of  Angiomax.  ACT was documented to be greater than 300.  We had to pre dilate  with a 2.25 Maverick balloon to 6 atmospheres.  We were then able to easily  pass the EZ small filter wire into a distal graft location.  We then  deployed an 18 mm x 3.0 mm Cypher stent to 19 atmospheres.  Two balloon  inflations were performed.  Brisk antegrade flow was noted post procedure.  We recovered the filter distally.  We documented capture of debris within  the filter.  Post angiographic images were very acceptable with TIMI grade 3  flow.  No symptoms occurred post procedure.    CONCLUSION:  Successful drug eluting stent deployment in the proximal  saphenous vein graft to the second diagonal with reduction of stenosis from  99% to 0%.  Pre dilatation was required.  Distal protection was successful.   PLAN:  Aspirin and Plavix.   Plavix for at least 12 months.                                                Lesleigh Noe, M.D.    HWS/MEDQ  D:  01/16/2003  T:  01/16/2003  Job:  [id number]   cc:   Donia Guiles, M.D.  [address] [place]  [place] [address]  Fax: [phone]   L. Lupe Carney, M.D.  [address] [place]  [place] [address]  Fax: [phone]   [employer]

## 2010-10-24 NOTE — Cardiovascular Report (Signed)
[place]  Patient:    Eric [name], Eric [name]                     MRN: [id number] Proc. Date: 06/16/00 Adm. Date:  [id number] Attending:  Mirian Mo CC:         Dyanne Carrel, M.D.  Clinton D. Maple Hudson, M.D.  Thomas C. Wall, M.D. [facility]  Cardiac Catheterization Lab   Cardiac Catheterization  CLINICAL HISTORY:  Mr. Shillingburg is 77 years old and has prior bypass surgery in 1995 and had a catheterization done in 1998 which showed patent grafts.  He has had recent chest pain and had a Cardiolite scan done sometime recently which showed no evidence of ischemia.  Because of persistent symptoms, he was seen by Juanito Doom and scheduled for catheterization.  He said his symptoms were similar to what he had prior to his bypass surgery.  He also has significant chronic obstructive pulmonary disease and has been treated recently by Dr. Manus Gunning for this with some initial improvement.  He also has known chronic gastroesophageal reflux disease.  DESCRIPTION OF PROCEDURE:  The procedure was performed via the right femoral artery using an arterial sheath and 6-French preformed coronary catheters.  A front wall arterial puncture was performed.  Omnipaque contrast was used.  The vein grafts were injected with a JR4 diagnostic right catheter except for the diagonal vein graft which was injected with a left bypass graft catheter.  The LIMA graft was injected with a LIMA catheter.  A distal aortogram was performed to reevaluate the patients known renal artery stenosis.  The patient tolerated the procedure well and left the laboratory in satisfactory condition.  The right femoral artery was closed with Perclose at the end of the procedure.  RESULTS: 1. The left main coronary artery:  The left main coronary artery had a 40%    distal left main stenosis. 2. The left anterior descending artery:  The left anterior descending artery    gave rise to 4 septal  perforators and a first diagonal branch which was    occluded and a second small diagonal branch, and then there was    competing flow distally.  There was a 40% ostial stenosis. 3. The circumflex artery:  The left circumflex artery gave rise to an    intermedius branch which was occluded, a marginal branch which had some    distal disease, and a posterolateral branch which appeared to be    completely occluded distally. 4. The right coronary artery:  The right coronary artery was a moderate    sized vessel that gave rise to 2 right ventricular branches and a posterior    descending branch.  There was 90% stenosis in the proximal vessel with    competing flow distally. 5. The saphenous vein graft to the posterior descending branch of the right    coronary artery was patent, but there was 50% eccentric stenosis in its mid    portion with some irregularity throughout. 6. The saphenous vein graft to the intermedius and posterolateral branch of    the circumflex artery was patent with 30% stenosis in its distal limb. 7. The saphenous vein graft to the diagonal branch of the LAD was patent and    functioned normally. 8. The LIMA graft to the LAD was patent and functioned normally.  Left ventriculogram:  The left ventriculogram, performed in the RAO projection, showed good wall motion with no areas of hypokinesis.  Distal aortogram:  A distal aortogram showed 50% left renal artery stenosis. There was no significant aortoiliac obstruction although the iliacs were visualized only in the very proximal portion.  Aortic pressure was 137/68 with a mean of 93.  Left ventricular pressure was 137/7.  CONCLUSION:  Coronary artery disease status post coronary artery bypass graft surgery, 1995.  The native circulation shows 40% left main, 40% proximal LAD stenosis, total occlusion of the first diagonal branch of the LAD, total occlusion of the intermedius branch of the circumflex artery, total  occlusion of the distal circumflex artery, and 90% stenosis in the proximal right coronary artery.  The vein grafts show 50% stenosis in the vein graft to the posterior descending branch of the right coronary artery, 30% stenosis in the distal limb of the vein graft to the intermedius and posterolateral branch of the circumflex artery, a patent vein graft to the diagonal branch of the LAD, and a patent LIMA graft to the LAD.  Left ventricular function is normal.  RECOMMENDATIONS:  There is no clear source of ischemia.  The patients symptoms are somewhat vague, and he complains of nausea and weakness.  I discussed these findings with Dr. Manus Gunning, and I will discuss this with the patient regarding possible further GI evaluation.  DD:  06/16/00 TD:  06/16/00 Job: [id number]

## 2010-10-24 NOTE — Discharge Summary (Signed)
[name]  Patient:    Eric [name], Eric [name]                     MRN: [id number] Adm. Date:  [id number] Attending:  Mirian Mo Dictator:   Abelino Derrick, P.A.C. [facility] CC:         Ulyess Mort, M.D. [facility]   Referring Physician Discharge [place]  ADDENDUM:  [name] wanted to stay in the hospital and have his gallbladder ultrasound done here.  Preliminary results from the radiologist showed a normal gallbladder with an incidental finding of a liver cyst.  The patient is to be called by Dr. Barnie Del office and will be seeing him as an outpatient. DD:  06/18/00 TD:  06/18/00 Job: [id number]

## 2010-10-24 NOTE — H&P (Signed)
NAME:  KENNEY, GOING NO.:  [id number]   MEDICAL RECORD NO.:  [id number]                   PATIENT TYPE:  INP   LOCATION:  1826                                 FACILITY:  [facility]   PHYSICIAN:  Peter M. Swaziland, M.D.               DATE OF BIRTH:  [birth year]   DATE OF ADMISSION:  01/12/2003  DATE OF DISCHARGE:                                HISTORY & PHYSICAL   HISTORY OF PRESENT ILLNESS:  Mr. Nabozny is a 77 year old white male with a  history of coronary artery disease, status post CABG in 1995.  He was a  former patient of Dr. Maisie Fus C. Wall, but is in the process of changing his  cardiac care to Dr. Lyn Records.  He presents with an eight-week history  of feeling poorly.  He complains of intermittent pain in his right anterior  chest, radiating to the left chest, and feeling that his arms were going to  fall off.  He complains of left arm numbness.  He saw Dr. Daleen Squibb approximately  six weeks ago and it was noted that his triglycerides were high; he was  tried on Niacin, which he took for two weeks but then stopped because of  intolerance.  He has felt much worse over this past week with increased  fatigue, chest pain and generalized feeling bad.  He has had some nausea  without shortness of breath or diaphoresis.  He states these symptoms were  similar to what he had prior to his bypass surgery.  Mr. Elmquist has  undergone repeat cardiac catheterization in 1998 and again in January of  2002; this demonstrated his grafts were patent but on his last  catheterization, he did have a 50% narrowing in the vein graft to the  posterolateral branch of the right coronary artery.  The patient has had  apparently some type of outpatient GI evaluation in the past but is unclear  what type.   PAST MEDICAL HISTORY:  Past medical history is significant for hypertension  and hyperlipidemia, history of asthma and chronic bronchitis.  He is status  post CABG in 1995  with an LIMA graft to the LAD, saphenous vein graft  sequentially to the intermediate and obtuse marginal branch, vein graft to  the posterior descending artery and a vein graft to the diagonal.  He also  has gastroesophageal reflux disease.   MEDICATIONS:  Medications include:  1. Zocor 20 mg per day.  2. Prinivil 20 mg per day.  3. Aspirin daily.  4. Albuterol nebulizers p.r.n.  5. Nexium 40 mg per day.   ALLERGIES:  He has no known allergies.  He is intolerant to BETA BLOCKERS  due to low pulse.   SOCIAL HISTORY:  The patient quit smoking nine years ago.  He is married and  has children.  He is retired.   FAMILY HISTORY:  Mother died of liver and  heart disease.  One sibling has  had a myocardial infarction.   REVIEW OF SYSTEMS:  Review of systems is otherwise negative.  He has had no  cough or chest congestion.  He denies any wheezing.  He has no history of  TIA or stroke.  He denies any claudication symptoms.  Other review of  systems are negative.   PHYSICAL EXAMINATION:  GENERAL:  On physical exam, the patient is an obese  white male in no apparent distress.  VITAL SIGNS:  Blood pressure is 190/90.  Pulse is 55, normal sinus rhythm.  He is afebrile.  HEENT:  The patient is hard of hearing; he wears hearing aids.  His pupils  are equal, round and reactive.  Conjunctivae are clear.  Oropharynx is  clear.  NECK:  Neck is without JVD, adenopathy or bruits.  There is no thyromegaly.  LUNGS:  Lungs reveal a few scattered wheezes.  CARDIAC:  Exam reveals a regular rate and rhythm without gallops, murmurs or  clicks.  ABDOMEN:  Abdomen is soft and nontender without masses or bruits.  EXTREMITIES:  Extremities are without edema.  Pulses are 2+ and symmetric.  NEUROLOGIC:  The patient is mildly groggy from morphine, otherwise,  nonfocal.   LABORATORY DATA:  ECG shows mild ST elevation inferiorly, but this is old.  He has poor R wave progression across the anterior precordial  leads.   Chest x-ray shows no active disease.   Hemoglobin is 14.  Sodium 123, potassium 4.5. chloride 90, CO2 27, BUN 22,  creatinine 1.1, glucose is 102.  PCO2 of 42, pH 7.4.  Myoglobin is 119,  troponin is less than 0.05, MB is 2.8.   IMPRESSION:  1. Chest pain with atypical features, need to rule out myocardial     infarction.  2. Status post coronary artery bypass graft in 1995.  3. Hypertension.  4. Hyperlipidemia.  5. Chronic obstructive pulmonary disease.  6. Hyponatremia.   PLAN:  We will follow up with CMET.  We will check serial cardiac enzymes,  repeat his IV nitroglycerin and heparin.  We will avoid a beta blocker due  to his bradycardia.  We will obtain a lipid panel.  We may need to consider  for repeat cardiac catheterization.                                                Peter M. Swaziland, M.D.    PMJ/MEDQ  D:  01/12/2003  T:  01/13/2003  Job:  [id number]   cc:   Lyn Records III, M.D.  [address]  [place]  [place] [address]  Fax: [phone]   Clinton D. Young, M.D.  [address] [place]  [place] [address]  Fax: [phone]   Bryan Lemma. Manus Gunning, M.D.  [address] [place]  [place] [address]  Fax: [phone]

## 2010-10-24 NOTE — H&P (Signed)
NAME:  Eric [name], Eric [name] NO.:  [id number]   MEDICAL RECORD NO.:  [id number]          PATIENT TYPE:  OIB   LOCATION:  6532                         FACILITY:  [facility]   PHYSICIAN:  Corky Crafts, MDDATE OF BIRTH:  07-Dec-1926   DATE OF ADMISSION:  03/13/2005  DATE OF DISCHARGE:                                HISTORY & PHYSICAL   HISTORY OF PRESENT ILLNESS:  The patient is a 77 year old who earlier today  had a renal artery angiogram performed because of refractory hypertension  and an abnormal CT angiogram. He was not found to have any significant renal  artery stenosis; however, postprocedure, after the sheath was removed and  manual compression had been held, the patient had an episode of bleeding. It  was subsequently controlled with additional manual compression. Currently,  he is without complaint; however, he is somewhat disappointed that no renal  artery stenosis was found as he was hoping that this would be a cure to his  blood pressure.   PAST MEDICAL HISTORY:  1.  Diabetes.  2.  Diverticulitis.  3.  Hypertension.  4.  Prostate cancer.   PAST SURGICAL HISTORY:  1.  Hernia repair.  2.  Prostate surgery.  3.  CABG in 1995 and stent placed.   ALLERGIES:  MORPHINE.   CURRENT MEDICATIONS:  1.  Diovan 160 mg twice daily.  2.  Metformin 500 mg twice daily.  3.  Metoprolol 25 mg twice daily.  4.  Aspirin 81 mg daily.  5.  Hydralazine 100 mg twice daily.  6.  Nexium 40 mg 1 daily.   SOCIAL HISTORY:  The patient smoked in the past. He quit in 1995. No alcohol  use. No herbal medicine use.   FAMILY HISTORY:  He does have heart disease in his family.   REVIEW OF SYSTEMS:  Significant for high blood pressure. No headaches. No  blurry vision. No chest pain or shortness of breath. No nausea or vomiting.   PHYSICAL EXAMINATION:  VITAL SIGNS:  Blood pressure 180/58, heart rate of  52.  GENERAL:  The patient is awake and alert and no apparent distress.  NECK:  No JVD and no carotid bruits.  CARDIOVASCULAR:  Bradycardic. S1 and S2.  LUNGS:  Clear to auscultation bilaterally.  ABDOMEN:  Soft, nontender, and nondistended.  EXTREMITIES:  No clubbing, cyanosis, or edema. There are 2+ right dorsalis  pedis pulses. Right groin shows no visible hematoma and dressing is clean,  dry, and intact.   IMPRESSION:  A 77 year old with significant hypertension who does not have  significant renal artery stenosis.   PLAN:  1.  We will observe overnight. The patient will likely be able to go home      tomorrow as his social support network will be stronger than. Part of      his concern tonight was being by himself after his procedure and having      the bleeding episode.  2.  We will increase hydralazine to 100 mg t.i.d. and we will also continue      Diovan and metoprolol. We will also likely  restart hydrochlorothiazide      next week sometime as an outpatient.  3.  We will hydrate the patient postprocedure to minimize the risk of      contrast nephropathy.           ______________________________  Corky Crafts, MD     JSV/MEDQ  D:  03/13/2005  T:  03/13/2005  Job:  [id number]

## 2010-10-24 NOTE — Discharge Summary (Signed)
NAME:  Eric [name], LUPPINO NO.:  [id number]   MEDICAL RECORD NO.:  [id number]          PATIENT TYPE:  OIB   LOCATION:  6532                         FACILITY:  [facility]   PHYSICIAN:  Corky Crafts, MDDATE OF BIRTH:  1926-07-10   DATE OF ADMISSION:  03/13/2005  DATE OF DISCHARGE:  03/14/2005                                 DISCHARGE SUMMARY   DISCHARGE DIAGNOSES:  1.  Hypertension.  2.  Diabetes.  3.  Peripheral vascular disease without evidence of hemodynamically      significant renal artery stenosis.   PROCEDURE:  Abdominal aortogram and bilateral selective renal angiogram.   HOSPITAL COURSE:  The patient underwent an abdominal aortogram and bilateral  selective renal angiogram on March 13, 2005. No hemodynamically significant  renal artery stenosis was found. The patient did have some slight bleeding  from his right groin after his sheath was removed. The patient was observed  overnight and had no significant problems. He did remain hypertensive. His  medication regimen was being adjusted while in the hospital. Overall, he is  in good condition.   DISCHARGE MEDICATIONS:  1.  Diovan 160 mg twice daily.  2.  Metformin 500 mg twice daily.  3.  Metoprolol 25 mg twice daily.  4.  Aspirin 81 mg daily.  5.  Hydralazine 100 mg three times a day.  6.  Nexium 40 mg once a day.   ACTIVITY:  No driving for 3 days. No heavy lifting for 1 week. He should  increase his activity slowly.   FOLLOW UP:  Appointment with Dr. Katrinka Blazing at [facility] within a few  weeks.   DIET:  Low-salt, low-fat diet.           ______________________________  Corky Crafts, MD     JSV/MEDQ  D:  03/17/2005  T:  03/17/2005  Job:  [id number]

## 2010-10-24 NOTE — Discharge Summary (Signed)
NAME:  Eric [name], Eric [name] NO.:  [id number]   MEDICAL RECORD NO.:  [id number]          PATIENT TYPE:  INP   LOCATION:  5504                         FACILITY:  [facility]   PHYSICIAN:  Eric [name], MDDATE OF BIRTH:  12-14-1926   DATE OF ADMISSION:  09/11/2006  DATE OF DISCHARGE:  10/15/3006                               DISCHARGE SUMMARY   DISCHARGE DIAGNOSIS:  1. Small bowel obstruction, resolved.  2. Asymptomatic bradycardia, status post cardiology evaluation per Dr.      Verdis [name].  3. Hypertension.   HISTORY OF PRESENT ILLNESS:  Eric [name] is a 77 year old white male  who  presented on 09/11/2006 with nausea and vomiting. With these episodes he  was also developed diaphoresis, denied any diarrhea, hematemesis or  melena.  He was admitted for further evaluation, vomiting and epigastric  pain.   PAST MEDICAL HISTORY:  1. Coronary artery disease, status post CABG and stenting.  2. History of hernia repair.  3. history of prostate cancer.  4. History of diverticulosis/colonoscopy.  5. History of gastroesophageal reflux disease.  6. History of bilateral cataract surgery.  7. History of chronic obstructive pulmonary disease.   HOSPITAL COURSE:  PROBLEM:  1. Small bowel obstruction.  The patient was admitted and was seen by      [name]. KUB, no dilated small bowel, consistent with small      bowel obstruction.  The patient underwent CT of the abdomen with      oral contrast which noted interval improvement in the appearance of      small bowel obstruction 09/12/2006.  He was given an NG tube to      allow suction.  The patient ultimately removed the NG tube himself.      KUB performed on 09/13/2006 noted normal gas pattern.  The patient      was started on clears which he tolerated  His diet was advanced to      regular, O2 also as tolerated.  2. Bradycardia.  The patient was noted to have bradycardia down as low      as 39 during this admission.   Cardiology consult was given.  The      patient was seen by Eric [name], his primary cardiologist.  We felt      that the patient's bradycardia was likely multifactorial.  He had      been placed on an IV beta blocker during this admission where he      was NPO as well as a Clonidine patch.  This was continued.  He was      currently receiving Norvasc, 5 mg by mouth daily which he is      tolerating and his blood pressure is 130/78.  At this time, we will      continue to hold Diovan and hydralazine as his blood pressure      appears stable.  We will resume his Bumex at the time of discharge      and refer her other med changes to cardiology, primary care as an      outpatient.  3. We were also resume the patient's Glucophage as his creatinine is      0.85.  It has been greater than 48 hours since his contrast CT.   MEDICATIONS:  At time of discharge:  1. Norvasc 5 mg by mouth daily.  2. Omni 1000 mg by mouth b.i.d..  3. Niacin 1000 mg by mouth b.i.d..  4. Nexium 40 mg by mouth daily.  5. Ecotrin 325 mg by mouth daily.  6. Bumetanide 1 mg by mouth daily.  7. Citalopram 20 mg by mouth daily.   PERTINENT LABORATORY AT TIME OF DISCHARGE:  Hemoglobin 11.3, BUN 16,  creatinine 0.85.   DISPOSITION:  The patient will be discharged to home.   FOLLOW UP:  The patient instructed to followup with Eric [name]  per his nurse.  They will contact the patient with an appointment.  He  is also instructed to followup with Eric [name] in 1-2 weeks and  contact his office for appointment.  He is instructed to discontinue  Metoprolol, Diovan and hydralazine.   Eric [name] dictating for [employer]. Eric [name].      Eric [name]      Eric Rover. Eric [name]  Electronically Signed    MO/MEDQ  D:  09/15/2006  T:  09/15/2006  Job:  [id number]

## 2010-10-24 NOTE — Discharge Summary (Signed)
NAME:  LAURI, TILL NO.:  [id number]   MEDICAL RECORD NO.:  [id number]                   PATIENT TYPE:  INP   LOCATION:  6525                                 FACILITY:  [facility]   PHYSICIAN:  Lyn Records, M.D.                DATE OF BIRTH:  12/25/1926   DATE OF ADMISSION:  01/12/2003  DATE OF DISCHARGE:  01/17/2003                                 DISCHARGE SUMMARY   CHIEF COMPLAINT:  Chest pain.   HISTORY OF PRESENT ILLNESS:  A 77 year old male with known CAD being status  post CABG from 43.  He presented to the emergency room on January 12, 2003,  with complaints of simply not feeling well and having recurrent intermittent  discomfort to his right anterior chest wall radiating over to the left side.  His other significant complaint was bilateral arm heaviness, somewhat  extreme in nature as well as some left arm numbness.  He had been started on  Niaspan secondary to hypertriglyceridemia approximately six weeks  previously, however, was unable to tolerate the medication.  His symptoms  apparently increased in severity over the last week along with increasing  fatigue.  He was having some mild intermittent nausea.   ADMISSION PHYSICAL EXAMINATION:  VITAL SIGNS:  Blood pressure 190/90, heart  rate 55, he was afebrile.  O2 saturations 94% on room air.   EKG showed a normal sinus rhythm with evidence of an old inferior MI.   Chest x-ray showed no acute process.   ADMISSION LABORATORY DATA:  Normal CBC with the exception of mild anemia  with a hemoglobin of 12.9.  Coags were normal.  CMP showed mild hyponatremia  at 124, otherwise normal.  Cardiac enzymes and cardiac markers were all  normal.   HOSPITAL COURSE:  The patient was admitted to rule out MI.  Serial cardiac  enzymes.  He was started on IV nitroglycerin and IV heparin.  No beta-  blockade secondary to mild bradycardia.   The next day, repeat cardiac enzymes were all normal.  EKG had  no acute  ischemic changes.  He had no further complaints of chest pain or arm  discomfort.  IV fluids continued for gentle hydration secondary to  hyponatremia.  He was planned for cardiac catheterization.   On January 15, 2003, he was taken to cardiac catheterization lab.  Results  showed diffuse disease in the SVG up to the diagonal both proximally and in  the mid portion.  There was also noted diffuse disease in the SVG to the PDA  as well as the sequential graft to the intermediate and OM.  The LIMA was  widely patent.  He has severe native disease. He had normal LV function with  an EF of 60%.  At this point, the procedure was terminated without any  incident. There was discussion with the patient and Dr. Katrinka Blazing as well as his  family as to consideration for PCI on the SVG to the diagonal.  Risks and  benefits were discussed and the patient was agreeable.   On January 16, 2003, the patient was brought back to the cardiac  catheterization lab.  He underwent PCI and CYPHER      stent placement to  the SVG to the second diagonal without complications.  The patient tolerated  the procedure well.  Sheath removal of without incident.   On January 17, 2003, the patient remained pain-free.  No shortness of breath  or acute complaints.  He is discharged to home without incident.   DISCHARGE MEDICATIONS:  1. Enteric coated aspirin 325 mg daily.  2. Prinivil 20 mg daily.  3. Zocor 20 mg daily.  4. Nexium 40 mg daily.  5. Plavix 75 mg daily x6 months.  6. Albuterol nebulizers p.r.n.   ACTIVITY:  He is not to undergo any strenuous activity for the next two  days.  He is not to undertake any excessive bending, stooping or pulling.  He is not to lift more than five pounds.  He may resume his normal activity  on Friday, January 19, 2003.   DIET:  He is to maintained a low fat, low cholesterol diet.   DISCHARGE INSTRUCTIONS:  He may shower and gently wash his right groin  catheterization site  with warm soap and water.  We have asked him not to  soak in a bathtub.  If he experiences pain, bruising or swelling to his  catheterization site he is to contact Dr.  Michaelle Copas office.   FOLLOW UP:  He has an appointment to see Dr. Katrinka Blazing for follow-up on Monday  January 29, 2003, at 1:15 p.m.      Adrian Saran, N.P.                        Lyn Records, M.D.    HB/MEDQ  D:  01/17/2003  T:  01/18/2003  Job:  [id number]

## 2010-10-24 NOTE — Assessment & Plan Note (Signed)
[facility]                         GASTROENTEROLOGY OFFICE NOTE   DADE, RODIN                     MRN:          [id number]  DATE:07/21/2006                            DOB:          1927-04-06    CHIEF COMPLAINT:  Vomiting, abdominal pain.   Mr. Eric [name] is a patient of Dr. Victorino Dike.  He has been followed with  irritable bowel syndrome.  He has also had problems with spells of  bloating, distention, vomiting, sometimes diarrhea but that is not a  predominant feature.  He has been given pro-biotics that have helped,  and he has also been given Xifaxan at varying intervals with some  benefit.  In the past, Cipro has not helped.  He called the office  yesterday, saying for the past several weeks he has felt poorly all  over, and then he developed abdominal pain and vomiting for several  days.  The pain was relatively diffuse in the abdomen.  He thinks he  might have diverticulitis.  He is not describing fever.  He is not  describing diarrhea or bleeding.  We asked him to come down for  evaluation.  He is starting to feel better.  All of these symptoms  started after eating some grapefruit.  Last year, he had labs in April  that showed a sed rate of 32, a hemoglobin of 12.5, glucose 119.  White  count was normal.  He says that he continues to have these problems, and  he does not think improvement would occur without antibiotics.   MEDICATIONS:  Doses are listed in the chart, but he is on hydralazine,  Diovan, Citalopram, bumetanide, Niaspan, Metformin, metoprolol, Ecotrin  325 mg, Nexium 40 mg daily, Mucinex, fluticasone nasal spray,  pravastatin.   PAST MEDICAL HISTORY:  Hypertension, aortic sclerosis, dyslipidemia and  gastroesophageal reflux disease with hiatal hernia, previous history of  colon polyps, COPD, coronary artery disease, diverticulosis, coronary  artery bypass grafting, percutaneous coronary intervention, obesity  Colonoscopy last performed 2006 showed diverticulosis and 2 dominative  polyps that were destroyed.  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   REVIEW OF SYSTEMS:  As above.   PHYSICAL EXAMINATION:  GENERAL:  Reveals an obese, elderly white man in  no acute distress.  VITAL SIGNS:  He has a weight of 232 pounds, pulse 82, blood pressure  140/70.  EYES:  Anicteric.  LUNGS:  Clear.  No CVA tenderness.  HEART:  S1, S2.  I heart no murmurs, rubs or gallops.  ABDOMEN:  Obese, soft, diffusely tender.  He even has a little bit of  rebound tenderness as times.  It was soft and benign feeling otherwise.  Bowel sounds are present.  There is no succussion splash.  The lower  extremities are free of edema.   ASSESSMENT:  Recurrent spells of abdominal pain and vomiting, sometimes  diarrhea.  He has apparently responded to antibiotics in the past that  would be more directed towards treatment of small bowel bacterial  overgrowth, which is a possibility with his diverticulosis and diabetes.  Irritable bowel  syndrome certainly is possible.  He could have  diverticulitis.  I am fairly impressed by the tenderness on his exam  today, though he does not look toxic.   PLAN:  1. Recheck labs including sed rate which was elevated earlier in 2007,      CBC and a CMET.  2. CT of the abdomen and pelvis with IV and oral contrast.  He had one      in 2006 that was unremarkable.  Some of his symptoms suggest      obstructive etiology as well, and after eating something like      grapefruit, he could have an adhesive band or something though.  He      does not have any abdominal surgery in the past.  That is one type      of food that could precipitate a small bowel obstruction or a      partial one, which the history is compatible with as well.  3. Pending the results of the CT, antibiotics would potentially be      indicated.  He has taken Cipro without relief in  the past.  Xifaxan      is expensive,  so the possibility of adding a probiotic such as      Align and metronidazole seems to make sense.     Eric Boop, MD,FACG  Electronically Signed    CEG/MedQ  DD: 07/21/2006  DT: 07/21/2006  Job #: [id number]

## 2010-10-24 NOTE — Op Note (Signed)
[facility]  Patient:    Eric [name], Eric [name] Visit Number: [id number] MRN: [id number]          Service Type: NES Location: [facility] Attending Physician:  Evlyn Clines Dictated by:   Excell Seltzer. Annabell Howells, M.D. Proc. Date: 09/27/01 Admit Date:  09/27/2001   CC:         Billie Lade, M.D.  Blair Heys, M.D.  Clinton D. Maple Hudson, M.D.  Thomas C. Wall, M.D. [facility]   Operative Report  PROCEDURE: 1. I-125 prostate seed implantation and cystoscopy. 2. Prostate ultrasound volume study. 3. Intraoperative treatment planning.  PREOPERATIVE DIAGNOSIS:  Prostate cancer.  POSTOPERATIVE DIAGNOSIS:  Prostate cancer.  SURGEON:  Excell Seltzer. Annabell Howells, M.D.  RADIATION ONCOLOGIST:  Billie Lade, M.D.  ANESTHESIA:  General.  DRAIN:  Foley.  COMPLICATIONS:  None.  INDICATIONS:  Mr. Verrette is a 77 year old white male, who was found to have an elevated PSA.  Prostate biopsy demonstrated Gleason 7 adenocarcinoma on 15% of the left side and Gleason 6, 3 + 3 on 5% of the right.  After discussing treatment options, he has elected a seed implantation.  FINDINGS AND PROCEDURE:  The patient was given IV Cipro.  He was taken to the operating room where a general anesthetic was induced.  He was placed in the lithotomy position.  His perineum was shaved.  He was prepped with Betadine solution.  A Foley catheter was inserted.  The balloon was filled with dilute contrast.  His scrotum was then reflected cephalad with an OpSite.  A rectal red rubber catheter was placed.  The ultrasound base using the CMS system was secured to the table.  The stepping device was then secured to the base.  The probe was placed and inserted.  The device was initialized.  The base of the prostate gland was located, and 0.5 cm step scanning of the prostate was performed to determine the treatment plan.  Once the step sections were completed, contouring of the prostate was performed, and the computer  then generated the treatment plan.  The needles were then prepared.  Stabilization needles were placed and secured, and the seeds were then implanted.  Initially the ultrasound probe was set at the base level, and the base level needles were placed.  It was then stepped out in half centimeter increments until all needles had been placed.  A total of 37 seeds of I-125 were used according to the CMS computer generated treatment plan.  At the completion of the implant, step scanning through the prostate demonstrated an excellent distribution of seeds.  Fluoroscopic views were obtained throughout the treatment to ensure good position.  Once the implant was completed, the ultrasound probe was removed.  The Foley catheter balloon had been disrupted during the procedure. It was removed without difficulty.  The OpSite was removed.  The penis was reprepped, and cystoscopy was performed with a 16 French flexible scope.  This demonstrated a small clot in the urethra but no seeds or strands coursing through the urethra or in the bladder.  Upon removal of the cystoscope, a fresh Foley catheter 16 [place] was placed.  The balloon was filled with 10 cc of fluid and placed to straight drainage.  The patient was taken down from lithotomy position.  His anesthetic was reversed.  He was moved to the recovery room in stable condition.  There was no complication during the procedure. Dictated by:   Excell Seltzer. Annabell Howells, M.D. Attending Physician:  Evlyn Clines DD:  09/27/01 TD:  09/27/01 Job: [id number]

## 2010-10-24 NOTE — Discharge Summary (Signed)
NAME:  Eric [name], CARLINE NO.:  [id number]   MEDICAL RECORD NO.:  [id number]          PATIENT TYPE:  INP   LOCATION:  3733                         FACILITY:  [facility]   PHYSICIAN:  Lyn Records, M.D.   DATE OF BIRTH:  [birth year]   DATE OF ADMISSION:  02/15/2005  DATE OF DISCHARGE:  02/17/2005                                 DISCHARGE SUMMARY   ADMISSION DIAGNOSIS:  Chest pain.   DISCHARGE DIAGNOSIS:  1.  Coronary atherosclerotic heart disease with resolved chest pain and      myocardial infarction ruled out.      1.  Cardiolite study negative for evidence of ischemia with normal          ejection fraction.  2.  Hypertension, somewhat labile, the patient does not know what medication      he currently takes and no one was available to bring his medications      down to help [place] decipher this.  3.  Diabetes mellitus.  4.  Hyperlipidemia.  5.  Hyponatremia, resolved, and probably secondary to diuretic therapy.  The      patient did finally tell us that he was taking HCTZ 1/2 tablet per day      but also may have SIADH.   PROCEDURES:  Stress Cardiolite February 17, 2005.   CONSULTATIONS:  Garnetta Buddy, M.D.   RECOMMENDATIONS:  1.  Blood pressure medications will be taken as before except that      hydrochlorothiazide will be discontinued and Hydralazine 50 mg b.i.d.      will be started.  2.  BMP will be done at the time of office visit with me on March 03, 2005.  3.  Will determine if the patient needs nephrology follow up based on labs      in my office.  4.  Condition on follow up is improved.  5.  The patient is specifically instructed to phone in his exact list of      medications he was taking prior to coming into the hospital and we will      make further recommendations after he gives [place] this information.  6.  1200 mL water restriction.   HOSPITAL COURSE:  The patient was admitted to the hospital with  nonspecific/atypical chest pain.   Enzymes were negative and EKG did not  reveal ischemia.  His monitor did demonstrate significant bradycardia with  heart rates into the 40s.  Heart rate increased into the low 50s by  decreasing his Lopressor to 25 mg b.i.d.  His blood pressure was somewhat  labile.  He was also noted to have hyponatremia on admission at 129 but  dipped to 127 the next day, on the day of discharge, has returned to 133.  He was seen by nephrology and it was felt that he may have  SIADH but probably has hyponatremia secondary to diuretic therapy, although  we were not exactly certain of the patient's medications.  Cardiolite  performed on February 17, 2005, was negative for ischemia, EF greater than  60%.  Lyn Records, M.D.  Electronically Signed     HWS/MEDQ  D:  02/17/2005  T:  02/17/2005  Job:  [id number]   cc:   Garnetta Buddy, M.D.  [address]  [name]  [place] [address]  Fax: [phone]   Thomos Lemons, D.O. [facility]  [address] [place], [place] [address]

## 2010-10-24 NOTE — Discharge Summary (Signed)
[place]  Patient:    Eric [name], Eric [name]                     MRN: [id number] Adm. Date:  [id number] Disc. Date: 06/18/00 Attending:  Mirian Mo Dictator:   Abelino Derrick, P.A.C. [facility] CC:         Thomas C. Daleen Squibb, M.D. [facility]  Dyanne Carrel, M.D.  Clinton D. Maple Hudson, M.D.   Discharge Summary  DISCHARGE DIAGNOSES: 1. Arm pain, no progression of coronary disease by catheterization this    admission. 2. Coronary artery bypass grafting in 1995. 3. Nausea and mild anemia with hemoccult negative stool. 4. Treated hyperlipidemia. 5. Treated hypertension. 6. Obesity. 7. Chronic obstructive pulmonary disease.  HOSPITAL COURSE:  The patient is a 78 year old male followed by Dr. Daleen Squibb and Dr. Manus Gunning and seen by Dr. Maple Hudson in the past.  He has a history of coronary artery disease and had coronary artery bypass grafting in 1995 with LIMA to LAD, SVG to the intermediate and OM, and SVG to PDA, and SVG to the diagonal. He had a catheterization in 1998 which revealed patent grafts.  His last Cardiolite was in November of 2001 which revealed diaphragmatic attenuation, but no ischemia with an EF of 63%.  He has been seeing Dr. Maple Hudson for bronchitis.  Since the new year he has noted increasing episodes of arm pain and intermittent tachycardia.  He associates this with shortness of breath. He says in the office, it feels "just like it did before my bypass."  He was admitted from the office by Dr. Antoine Poche for diagnostic catheterization.  This was done by Dr. Juanda Chance on June 16, 2000, which revealed 50% narrowing of the SVG to the PDA, 30% narrowing of the SVG to the PL, patent LIMA to LAD, and patent SVG to the diagonal.  His EF was 60%.  He had a 50% left renal artery narrowing.  He was initially set up for a gallbladder ultrasound and a GI evaluation.  Unfortunately, these were unable to be done on January 10, and the patient did not want to wait  on January 11.  This can be setup as an outpatient.  Our office will contact him with an appointment to have the gallbladder ultrasound done and a follow-up with Ulyess Mort, M.D. Rectal examination done prior to discharge showed dark brown hemoccult negative stool.  LABORATORY DATA:  Liver functions were normal.  Serum ferritin 254.  White count 5.2, hemoglobin 11, hematocrit 32, platelets 238.  INR 1.1, sodium 137, potassium 3.7, BUN 18, creatinine 1.0.  CK-MB and troponins were negative x 2. Urinalysis was unremarkable.  Chest x-ray showed evidence of congestive heart failure on examination.  EKG showed sinus rhythm, first degree AV block, inferior T waves.  DISPOSITION:  The patient is discharged in stable condition and will follow up with Dr. Manus Gunning and Ulyess Mort, M.D.  DISCHARGE MEDICATIONS: 1. Coated aspirin 325 mg once a day. 2. Toprol XL 50 mg a day. 3. Zocor 20 mg a day. 4. Prilosec 20 mg a day. 5. Zestril 20 mg a day. 6. Nitroglycerin sublingual p.r.n. DD:  06/18/00 TD:  06/18/00 Job: [id number]

## 2010-10-24 NOTE — Discharge Summary (Signed)
NAME:  Eric [name], Eric [name] NO.:  [id number]   MEDICAL RECORD NO.:  [id number]                   PATIENT TYPE:  OBV   LOCATION:  4704                                 FACILITY:  [facility]   PHYSICIAN:  Lyn Records, M.D.                DATE OF BIRTH:  1926/12/01   DATE OF ADMISSION:  04/13/2003  DATE OF DISCHARGE:  04/17/2003                                 DISCHARGE SUMMARY   CHIEF COMPLAINT:  Chest pain and fatigue.   HISTORY OF PRESENT ILLNESS:  Please see complete H&P for details but in  short, Mr. Shells is a 77 year old male with known coronary artery disease.  He is status post CABG from 1995.  He has had several recent  hospitalizations the last of which was August of this year secondary to  chest pain.  He underwent cardiac catheterization at that time which showed  diffuse disease in two of his three grafts, the diagonal and the PDA.  LIMA  was patent.  Ejection fraction was normal at 60%.  He underwent percutaneous  transluminal coronary angioplasty and Cypher stent placement to the SVG to  the diagonal during that admission.   He presented to the emergency room on the evening of April 13, 2003 with  complaints of chest discomfort that began while watching television.  He had  been seen in the office the day previously with complaints of severe  intermittent fatigue as well as occasional intermittent episodes of chest  discomfort relieved with rest and nitroglycerin.  He was concerned that his  fatigue may have been caused by blood pressure medications and/or the new  addition of Plavix in August.  He was found to be mildly bradycardic with  heart rate in the 50's, occasionally dropping into the 40's.  He had a  Holter monitor placed to rule out arrhythmias as the etiology.  He  appropriately developed this chest discomfort the evening of admission and  presented to the emergency room after he had no real relief with  nitroglycerin.   PHYSICAL EXAM ON ADMISSION:  Please see complete H&P for details but in  short, vital signs were stable with a blood pressure of 142/65, heart rate  68.  He was afebrile.  Lungs were clear.  The heart had a regular rate and  rhythm.  No murmur or gallop.  He had no vascular bruit.  Peripheral pulses  were +2 bilaterally.  Abdomen was benign with normal bowel sounds.  He had  no peripheral edema or neurological deficits.   EKG showed a normal sinus rhythm with an incomplete right bundle branch  block which is old.  Chest x-ray showed no acute process.   ADMISSION LABORATORY DATA:  Normal CBC and CMP.  Cardiac markers were  normal.   HOSPITAL COURSE:  The patient was admitted to rule out myocardial  infarction.  He was placed on telemetry.  Serial  cardiac enzymes.  Continued  aspirin and Plavix.  He was started on IV heparin, Nitrol paste was also  initiated.  The morning following admission he was still complaining of  intermittent chest discomfort, however, it was very difficult for him to  describe or characterize.  He did have some mild chest wall tenderness.  Plans for Cardiolite study were made which he underwent on April 13, 2003.  Late in the afternoon Dr. Katrinka Blazing went to review Cardiolite films.  However  the data was reported to be lost.  He had a discussion with the patient in  regards to repeating the nuclear study or simply repeating cardiac  catheterization.  The patient decided to proceed with the latter.   He continued to have episodes of intermittent pain.  No shortness of breath,  diaphoresis or radiation.  He was given Morphine sulfate intravenously with  relief of his pain.  Exam was essentially unchanged.  Blood pressure was  stable.  He remains afebrile.  Oxygen saturations 98% on room air.  Telemetry maintaining a sinus rhythm.  Hemoglobin was stable.  Cardiac  enzymes were all negative.  No EKG changes.  At this point plans for a left  heart catheterization was  scheduled for Monday, April 16, 2003.   Over the next two days he essentially had no change in his condition.  He  did have some mild confusion one evening after receiving Ambien for sleep.  This was discontinued and he had no further recurrence.   On April 17, 2003 he was taken to the cardiac catheterization lab.  Results showed essentially no change since his catheterization in August,  2004.  LIMA remained patent.  Saphenous vein graft to the diagonal as well  as the right coronary artery were widely patent.  Previously stented area  was also patent.  At this point medical therapy will be continued.  There is  no indication of any obstructive ischemic lesion.   On April 17, 2003 the patient states he is feeling much better.  He has  had no recurrent chest discomfort.  After a long discussion with the  patient, he now admits to some stress and anxiety in regards to family  situations.  He also saw Dr. Dorinda Hill approximately one to two weeks  prior to this admission and was started on Nexium b.i.d. for 10 to 14 days  then reducing back to daily dosing.  He is also scheduled for  EGD/colonoscopy with Dr. Adolph Pollack the first of next year.  He states he was  unable to afford the Nexium and hence at the [facility] where he gets his  medications was given Prevacid instead for a lower co-payment.  He also had  been sleeping with his head elevated on blocks which he apparently was  uncomfortable with and stopped that many months ago.  He also describes this  discomfort as a burning sensation.  The patient was discharged home  without incident.   DISCHARGE MEDICATIONS:  1. Aspirin 325 mg daily.  2. Plavix 75 mg daily.  3. Zocor 20 mg daily.  4. Prinivil 20 mg daily.  5. Nitroglycerin 0.4 mg p.r.n.  6. Nexium 40 mg b.i.d. x10 days then daily.   DISCHARGE INSTRUCTIONS:  He is not to undergo any strenuous activity for the next few days, i.e., no lifting more than 5 to 10 pounds,  bending, stooping,  straining, etc.  He is not to drive or engage in sexual activity today.  He  may resume his normal activities on Thursday, April 19, 2003.  He is to  maintain a low salt, low fat, low cholesterol diet.  He may shower today and  gently wash his right groin cath site with warm soap and water.   If he has any pain, bruising or swelling at the cath site or develops any  new problems he is to contact Dr. Katrinka Blazing.  He has been advised to contact Dr.  Dorinda Hill and get his appointment for GI evaluation moved up.   He has an appointment to see Dr. Katrinka Blazing in follow up Wednesday, April 16, 2003 at 10:30 a.m.      Adrian Saran, N.P.                        Lyn Records, M.D.    HB/MEDQ  D:  04/17/2003  T:  04/17/2003  Job:  [id number]   cc:   Ulyess Mort, M.D. [facility]

## 2010-10-28 ENCOUNTER — Telehealth: Payer: Self-pay

## 2010-10-28 NOTE — Telephone Encounter (Signed)
labs reviewed - cholesterol well controlled and DM stable on test results. No medication changes recommended. Thanks.  Lab Results  Component Value Date   HGBA1C 7.0* 10/23/2010

## 2010-10-28 NOTE — Telephone Encounter (Signed)
Pt called requesting results of A1C and chol done last week, please advise.

## 2010-10-29 NOTE — Telephone Encounter (Signed)
Pt advised.

## 2010-12-16 ENCOUNTER — Encounter: Payer: Self-pay | Admitting: Internal Medicine

## 2010-12-16 DIAGNOSIS — Z0000 Encounter for general adult medical examination without abnormal findings: Secondary | ICD-10-CM | POA: Insufficient documentation

## 2010-12-17 ENCOUNTER — Ambulatory Visit: Payer: Medicare Other | Admitting: Internal Medicine

## 2010-12-18 ENCOUNTER — Encounter: Payer: Self-pay | Admitting: Internal Medicine

## 2010-12-18 ENCOUNTER — Ambulatory Visit (INDEPENDENT_AMBULATORY_CARE_PROVIDER_SITE_OTHER): Payer: Medicare Other | Admitting: Internal Medicine

## 2010-12-18 ENCOUNTER — Other Ambulatory Visit (INDEPENDENT_AMBULATORY_CARE_PROVIDER_SITE_OTHER): Payer: Medicare Other

## 2010-12-18 VITALS — BP 122/60 | HR 60 | Temp 98.1°F | Ht 68.0 in | Wt 248.8 lb

## 2010-12-18 DIAGNOSIS — I10 Essential (primary) hypertension: Secondary | ICD-10-CM

## 2010-12-18 DIAGNOSIS — E119 Type 2 diabetes mellitus without complications: Secondary | ICD-10-CM

## 2010-12-18 DIAGNOSIS — R1032 Left lower quadrant pain: Secondary | ICD-10-CM

## 2010-12-18 LAB — HEPATIC FUNCTION PANEL
ALT: 19 U/L (ref 0–53)
Albumin: 4.4 g/dL (ref 3.5–5.2)
Bilirubin, Direct: 0.1 mg/dL (ref 0.0–0.3)
Total Protein: 7.1 g/dL (ref 6.0–8.3)

## 2010-12-18 LAB — BASIC METABOLIC PANEL
BUN: 14 mg/dL (ref 6–23)
CO2: 27 mEq/L (ref 19–32)
Calcium: 9.9 mg/dL (ref 8.4–10.5)
Chloride: 102 mEq/L (ref 96–112)
Creatinine, Ser: 1 mg/dL (ref 0.4–1.5)
Glucose, Bld: 120 mg/dL — ABNORMAL HIGH (ref 70–99)

## 2010-12-18 LAB — CBC WITH DIFFERENTIAL/PLATELET
Basophils Absolute: 0 10*3/uL (ref 0.0–0.1)
Basophils Relative: 0.4 % (ref 0.0–3.0)
Eosinophils Absolute: 0.6 10*3/uL (ref 0.0–0.7)
Lymphocytes Relative: 10.2 % — ABNORMAL LOW (ref 12.0–46.0)
MCHC: 34.4 g/dL (ref 30.0–36.0)
Monocytes Relative: 10.3 % (ref 3.0–12.0)
Neutrophils Relative %: 67.4 % (ref 43.0–77.0)
RBC: 3.79 Mil/uL — ABNORMAL LOW (ref 4.22–5.81)
RDW: 13.6 % (ref 11.5–14.6)

## 2010-12-18 LAB — URINALYSIS, ROUTINE W REFLEX MICROSCOPIC
Leukocytes, UA: NEGATIVE
Specific Gravity, Urine: 1.025 (ref 1.000–1.030)
Urobilinogen, UA: 0.2 (ref 0.0–1.0)
pH: 6 (ref 5.0–8.0)

## 2010-12-18 MED ORDER — CEFTRIAXONE SODIUM 1 G IJ SOLR
1.0000 g | Freq: Once | INTRAMUSCULAR | Status: AC
  Administered 2010-12-18: 1 g via INTRAMUSCULAR

## 2010-12-18 MED ORDER — AMOXICILLIN-POT CLAVULANATE 875-125 MG PO TABS: 1.0000 | ORAL_TABLET | Freq: Two times a day (BID) | ORAL | Status: DC

## 2010-12-18 MED ORDER — METRONIDAZOLE 250 MG PO TABS: 250.0000 mg | ORAL_TABLET | Freq: Three times a day (TID) | ORAL | Status: AC

## 2010-12-18 MED ORDER — AMOXICILLIN-POT CLAVULANATE 875-125 MG PO TABS: 1.0000 | ORAL_TABLET | Freq: Two times a day (BID) | ORAL | Status: AC

## 2010-12-18 NOTE — Progress Notes (Signed)
Subjective:    Patient ID: Eric [name], male    DOB: September 26, 1926, 77 y.o.   MRN: [id number]  HPI  Here for acute visit,  Felt fine getting up in the AM on Mon this wk (3 days ago) and had his usual shower, but thereafter with sudden onset abd and lower back pain, seems to feel the pain starts in the back and radiates forward at the belt line, but also had general abd pain as well,  Has reg BM's (2 yest, 1 today) but seems normal to him, does not help the pain;  Did pass some gas from below and seemed to help the pain more than anything, pain overall mild to mod, persistent, makes it difficult to walk, has seen Dr Roosevelt Locks for the lower back/ has appt wed July 18 but just couldn't wait until then to be seen.  Pt denies fever, wt loss, night sweats, loss of appetite, or other constitutional symptoms.  Denies worsening reflux, dysphagia, n/v, bowel change or blood.  Denies urinary symptoms such as dysuria, frequency, urgency,or hematuria, in fact usually has to sit to urinate but today could stand and has Ok stream.  Has hx of SBO several yrs ago, tx conservatively and resolved without recurrence. Does not have the abd distension this time like that.  Pt denies chest pain, increased sob or doe, wheezing, orthopnea, PND, increased LE swelling, palpitations, dizziness or syncope.  Pt denies new neurological symptoms such as new headache, or facial or extremity weakness or numbness  Can walk usually half mile per day slowly for exercise, but could not due to pain in the past 4 days. No hx of renal stones, no hematuria.  Pt denies polydipsia, polyuria, or low sugar symptoms such as weakness or confusion improved with po intake.  Pt states overall good compliance with meds, trying to follow lower cholesterol, diabetic diet, wt overall stable but little exercise however.     Past Medical History  Diagnosis Date  . LACTOSE INTOLERANCE   . OBESITY   . CORONARY ARTERY DISEASE     CABG 1995, PTCA/DES 2008,  2009 and 08/2010  . CARDIOMYOPATHY, ISCHEMIC   . AORTIC SCLEROSIS   . SICK SINUS/ TACHY-BRADY SYNDROME 09/2007    s/p PPM st judes  . PERIPHERAL VASCULAR DISEASE   . CAROTID BRUIT, RIGHT 02/27/2008  . Asthma   . IBS (irritable bowel syndrome)   . ALLERGIC RHINITIS   . ANEMIA-NOS   . OA (osteoarthritis)   . Prostate cancer   . COPD   . DIABETES MELLITUS-TYPE II   . GERD   . HYPERLIPIDEMIA   . HYPERTENSION   . HIATAL HERNIA    Past Surgical History  Procedure Date  . Ptca 2008, 2009, 2012    with DES  . Partial small bowel obstruction 2009  . Bilateral cataracts   . Pacemaker insertion   . Hernia repair   . Lumbar disc surgery 12/2008  . Coronary artery bypass graft 1995    reports that he quit smoking about 15 years ago. He does not have any smokeless tobacco history on file. He reports that he does not drink alcohol or use illicit drugs. family history is not on file. Allergies  Allergen Reactions  . Ciprofloxacin     REACTION: Arthralgia  . Metformin   . Pioglitazone     REACTION: Rash   Current Outpatient Prescriptions on File Prior to Visit  Medication Sig Dispense Refill  . albuterol (PROVENTIL) (2.5 MG/3ML) 0.083%  nebulizer solution Take 2.5 mg by nebulization 3 (three) times daily.        [name] aluminum & magnesium hydroxide (MAALOX) 225-200 MG/5ML suspension Take by mouth as needed.        [name] amLODipine (NORVASC) 5 MG tablet Take 5 mg by mouth daily.        [name] aspirin 325 MG EC tablet Take 325 mg by mouth daily.        . bumetanide (BUMEX) 1 MG tablet Take 1 mg by mouth daily.        . clopidogrel (PLAVIX) 75 MG tablet Take 75 mg by mouth daily.        [name] esomeprazole (NEXIUM) 40 MG capsule Take 40 mg by mouth daily before breakfast.        . HYDROcodone-acetaminophen (NORCO) 5-325 MG per tablet Take 1 tablet by mouth every 6 (six) hours as needed.        . Niacin CR 1000 MG TBCR Take by mouth at bedtime.        . polyethylene glycol (MIRALAX / GLYCOLAX) packet Take 17  g by mouth daily.        . pravastatin (PRAVACHOL) 40 MG tablet Take 40 mg by mouth at bedtime. Take 2 by mouth at bedtime       . predniSONE (DELTASONE) 5 MG tablet Take 5 mg by mouth daily.        . promethazine (PHENERGAN) 25 MG tablet Take 25 mg by mouth every 6 (six) hours as needed.        . senna (SENOKOT) 8.6 MG tablet Take 2 tablets by mouth 2 (two) times a week.        . simethicone (GAS-X) 80 MG chewable tablet Chew 80 mg by mouth as needed.        . valsartan (DIOVAN) 160 MG tablet Take 160 mg by mouth daily.        . vitamin B-12 (CYANOCOBALAMIN) 500 MCG tablet Take 500 mcg by mouth 2 (two) times daily.        [name] azithromycin (ZITHROMAX) 250 MG tablet Take 2 tablets by mouth on day 1, followed by 1 tablet by mouth daily for 4 days.         Review of Systems Review of Systems  Constitutional: Negative for diaphoresis and unexpected weight change.  HENT: Negative for drooling and tinnitus.   Eyes: Negative for photophobia and visual disturbance.  Respiratory: Negative for choking and stridor.   Gastrointestinal: Negative for vomiting and blood in stool.  Genitourinary: Negative for hematuria and decreased urine volume.  Skin: Negative for color change and wound.  Neurological: Negative for tremors and numbness.  Psychiatric/Behavioral: Negative for decreased concentration. The patient is not hyperactive.       Objective:   Physical Exam BP 122/60  Pulse 60  Temp(Src) 98.1 F (36.7 C) (Oral)  Ht 5\' 8"  (1.727 m)  Wt 248 lb 12.8 oz (112.855 kg)  BMI 37.83 kg/m2  SpO2 90% Physical Exam  VS noted, ill and uncomfortabel appearing, gets up slowly, hard to stand up straight Constitutional: Pt appears well-developed and well-nourished.  HENT: Head: Normocephalic.  Right Ear: External ear normal.  Left Ear: External ear normal.  Eyes: Conjunctivae and EOM are normal. Pupils are equal, round, and reactive to light.  Neck: Normal range of motion. Neck supple.  Cardiovascular:  Normal rate and regular rhythm.   Pulmonary/Chest: Effort normal and breath sounds normal.  Abd:  Soft,  non-distended, + BS  but mod tender to low mid abd and LLQ , without guarding or rebound Neurological: Pt is alert. No cranial nerve deficit.  Skin: Skin is warm. No erythema.  Psychiatric: Pt behavior is normal. Thought content normal.         Assessment & Plan:

## 2010-12-18 NOTE — Assessment & Plan Note (Addendum)
Though afeb, clinically appaers to likely have diverticulitis, prob can be managed as outpt;  Will start empiric cipro/flagy, routine labs today to assess renal fxn et al;  Also for CT hopefully in the AM

## 2010-12-18 NOTE — Patient Instructions (Signed)
You had the antibiotic shot today (rocephin) Take all new medications as prescribed - the two antibiotics Continue all other medications as before Please go to LAB in the Basement for the blood and/or urine tests to be done today You will be contacted regarding the referral for: CT scan  - please see the [facility] here before leaving today Please call the phone number [phone] (the PhoneTree System) for results of testing in 2-3 days;  When calling, simply dial the number, and when prompted enter the MRN number above (the Medical Record Number) and the # key, then the message should start.

## 2010-12-19 ENCOUNTER — Ambulatory Visit (INDEPENDENT_AMBULATORY_CARE_PROVIDER_SITE_OTHER)
Admission: RE | Admit: 2010-12-19 | Discharge: 2010-12-19 | Disposition: A | Discharge status: Home / Self Care | Payer: Medicare Other | Source: Ambulatory Visit | Attending: Internal Medicine | Admitting: Internal Medicine

## 2010-12-19 DIAGNOSIS — R1032 Left lower quadrant pain: Secondary | ICD-10-CM

## 2010-12-19 MED ORDER — IOHEXOL 300 MG/ML  SOLN
100.0000 mL | Freq: Once | INTRAMUSCULAR | Status: AC | PRN
  Administered 2010-12-19: 100 mL via INTRAVENOUS

## 2010-12-24 ENCOUNTER — Encounter: Payer: Self-pay | Admitting: Internal Medicine

## 2010-12-24 NOTE — Assessment & Plan Note (Signed)
stable overall by hx and exam, most recent data reviewed with pt, and pt to continue medical treatment as before  BP Readings from Last 3 Encounters:  12/18/10 122/60  10/16/10 132/52  07/10/10 124/68

## 2010-12-24 NOTE — Assessment & Plan Note (Signed)
stable overall by hx and exam, most recent data reviewed with pt, and pt to continue medical treatment as before  Lab Results  Component Value Date   HGBA1C 7.0* 10/23/2010

## 2011-01-29 ENCOUNTER — Ambulatory Visit (INDEPENDENT_AMBULATORY_CARE_PROVIDER_SITE_OTHER): Payer: Medicare Other | Admitting: Internal Medicine

## 2011-01-29 ENCOUNTER — Encounter: Payer: Self-pay | Admitting: Internal Medicine

## 2011-01-29 ENCOUNTER — Other Ambulatory Visit (INDEPENDENT_AMBULATORY_CARE_PROVIDER_SITE_OTHER): Payer: Medicare Other

## 2011-01-29 DIAGNOSIS — F419 Anxiety disorder, unspecified: Secondary | ICD-10-CM

## 2011-01-29 DIAGNOSIS — E119 Type 2 diabetes mellitus without complications: Secondary | ICD-10-CM

## 2011-01-29 DIAGNOSIS — I10 Essential (primary) hypertension: Secondary | ICD-10-CM

## 2011-01-29 DIAGNOSIS — F411 Generalized anxiety disorder: Secondary | ICD-10-CM

## 2011-01-29 LAB — HEMOGLOBIN A1C: Hgb A1c MFr Bld: 7.1 % — ABNORMAL HIGH (ref 4.6–6.5)

## 2011-01-29 MED ORDER — ALPRAZOLAM 0.25 MG PO TABS: 0.2500 mg | ORAL_TABLET | Freq: Three times a day (TID) | ORAL | Status: DC | PRN

## 2011-01-29 NOTE — Assessment & Plan Note (Signed)
Diet controlled, no longer follows with endo (prev kerr) Check a1c q3 mo - due now Intolerant of metformin due to nausea and "hospitalization"  Lab Results  Component Value Date   HGBA1C 7.0* 10/23/2010

## 2011-01-29 NOTE — Assessment & Plan Note (Signed)
BP Readings from Last 3 Encounters:  01/29/11 132/52  12/18/10 122/60  10/16/10 132/52   The current medical regimen is effective;  continue present plan and medications.

## 2011-01-29 NOTE — Progress Notes (Signed)
Subjective:    Patient ID: Eric [name], male    DOB: 1927-02-17, 77 y.o.   MRN: [id number]  HPI  here for follow up - routine care also at [facility] Requests something for "nerves" - anxious about move to senior apt in [place]  Also reviewed chronic medical issues: CAD, hx CABG and multiple PTCA/DES - hosp 08/2010 reviewed with resolution of shoulder pain following DES. Follows with local cards ([facility]) - no residual CP/neck or shoulder symptoms  - the patient reports compliance with medication(s) as prescribed. Denies adverse side effects.  chronic low back pain and DDD - hx falls early 2011but none since starting steroid shots (ramos) has undergone injection to right hip with near 100% resolution of pain and walking/balance problems- completed work with PT for balance -  DM2, diet controlled -  Previously stopped Actos due to fluid retention and rash- Follows with endo, kerr q81mo- off all meds including metformin checks sugars infreq now - only goes over 120 if eat something sweet   HTN - reports compliance with ongoing medical treatment and no changes in medication dose or frequency. denies adverse side effects related to current therapy.   dyslipidemia - on statin - reports compliance with ongoing medical treatment and no changes in medication dose or frequency. denies adverse side effects related to current therapy.   GERD - reports compliance with ongoing medical treatment and no changes in medication dose or frequency. denies adverse side effects related to current therapy. requests samples nexium if available  Past Medical History  Diagnosis Date  . LACTOSE INTOLERANCE   . OBESITY   . CORONARY ARTERY DISEASE     CABG 1995, PTCA/DES 2008, 2009 and 08/2010  . CARDIOMYOPATHY, ISCHEMIC   . AORTIC SCLEROSIS   . SICK SINUS/ TACHY-BRADY SYNDROME 09/2007    s/p PPM st judes  . PERIPHERAL VASCULAR DISEASE   . CAROTID BRUIT, RIGHT 02/27/2008  . Asthma   . IBS (irritable bowel syndrome)     . ALLERGIC RHINITIS   . ANEMIA-NOS   . OA (osteoarthritis)   . Prostate cancer   . COPD   . DIABETES MELLITUS-TYPE II   . GERD   . HYPERLIPIDEMIA   . HYPERTENSION   . HIATAL HERNIA     Review of Systems  Constitutional: Negative for fever and fatigue.  Respiratory: Negative for shortness of breath.   Genitourinary: Negative for difficulty urinating.  Musculoskeletal: Negative for myalgias.       Objective:   Physical Exam  BP 132/52  Pulse 79  Temp(Src) 98.1 F (36.7 C) (Oral)  Ht 5\' 8"  (1.727 m)  Wt 247 lb (112.038 kg)  BMI 37.56 kg/m2  SpO2 94% Constitutional:  Overweight. appears well-developed and well-nourished. No distress.  Neck: thick and short, Normal range of motion. Neck supple. No JVD present. No thyromegaly present.  Cardiovascular: Normal rate, regular rhythm and normal heart sounds.  No murmur heard. trace BLE edema Pulmonary/Chest: Effort normal and breath sounds normal. No respiratory distress. no wheezes.  Neurological: he is alert and oriented to person, place, and time. No cranial nerve deficit. Coordination normal.  Psychiatric: he has a normal mood and affect. behavior is normal. Judgment and thought content normal.      Lab Results  Component Value Date   WBC 5.2 12/18/2010   HGB 12.5* 12/18/2010   HCT 36.3* 12/18/2010   PLT 141.0* 12/18/2010   CHOL 100 10/23/2010   TRIG 109.0 10/23/2010   HDL 33.80* 10/23/2010  ALT 19 12/18/2010   AST 23 12/18/2010   NA 138 12/18/2010   K 4.3 12/18/2010   CL 102 12/18/2010   CREATININE 1.0 12/18/2010   BUN 14 12/18/2010   CO2 27 12/18/2010   TSH 1.19 04/28/2010   PSA 0.02* 09/10/2006   INR 1.0 12/11/2008   HGBA1C 7.0* 10/23/2010   MICROALBUR 0.5 04/18/2009    Assessment & Plan:  See problem list. Medications and labs reviewed today.

## 2011-01-29 NOTE — Assessment & Plan Note (Signed)
Add prn xanax to help with panic attacks anticipating move to [place] and senio appt - Risks and benefits discussed including prn, nort scheduled use - pt understands and agrees Will consider SSRI if daily med needed

## 2011-01-29 NOTE — Patient Instructions (Signed)
It was good to see you today. Test(s) ordered today. Your results will be called to you after review (48-72hours after test completion). If any changes need to be made, you will be notified at that time. Medications reviewed, no changes at this time -  ok to use xanax as needed for anxiety/ "nerves" - Your prescription(s) have been submitted to your pharmacy. Please take as directed and contact our office if you believe you are having problem(s) with the medication(s). Please schedule followup in 3-4 months for diabetes and cholesterol check, call sooner if problems.

## 2011-02-26 LAB — CBC
HCT: 31.6 — ABNORMAL LOW
HCT: 31.8 — ABNORMAL LOW
HCT: 34.7 — ABNORMAL LOW
Hemoglobin: 10.9 — ABNORMAL LOW
Hemoglobin: 12.1 — ABNORMAL LOW
MCHC: 34.4
Platelets: 151
RBC: 3.25 — ABNORMAL LOW
RDW: 13.3
WBC: 3.7 — ABNORMAL LOW
WBC: 4.4

## 2011-02-26 LAB — COMPREHENSIVE METABOLIC PANEL
AST: 20
Albumin: 3.7
Alkaline Phosphatase: 66
Chloride: 102
GFR calc Af Amer: >60
Potassium: 4
Sodium: 138
Total Bilirubin: 0.6

## 2011-02-26 LAB — DIFFERENTIAL
Basophils Absolute: 0
Basophils Relative: 0
Eosinophils Relative: 6 — ABNORMAL HIGH
Lymphocytes Relative: 12
Monocytes Absolute: 0.4

## 2011-02-26 LAB — CK TOTAL AND CKMB (NOT AT ARMC)
CK, MB: 1.5
CK, MB: 1.7
Relative Index: INVALID
Relative Index: INVALID
Total CK: 71
Total CK: 86

## 2011-02-26 LAB — BASIC METABOLIC PANEL
CO2: 27
Chloride: 105
Glucose, Bld: 106 — ABNORMAL HIGH
Potassium: 4.4
Sodium: 138

## 2011-02-26 LAB — B-NATRIURETIC PEPTIDE (CONVERTED LAB): Pro B Natriuretic peptide (BNP): 90

## 2011-02-26 LAB — LIPID PANEL
HDL: 22 — ABNORMAL LOW
Total CHOL/HDL Ratio: 4.9
VLDL: 19

## 2011-02-26 LAB — PROTIME-INR: Prothrombin Time: 14.7

## 2011-02-26 LAB — TROPONIN I: Troponin I: 0.01

## 2011-02-26 LAB — APTT: aPTT: 37

## 2011-03-03 LAB — CLOSTRIDIUM DIFFICILE EIA: C difficile Toxins A+B, EIA: NEGATIVE

## 2011-03-03 LAB — PLATELET COUNT
Platelets: 177
Platelets: 203

## 2011-03-03 LAB — BASIC METABOLIC PANEL
BUN: 19
BUN: 27 — ABNORMAL HIGH
BUN: 51 — ABNORMAL HIGH
CO2: 25
CO2: 25
CO2: 26
CO2: 29
CO2: 30
Calcium: 8.9
Calcium: 9.3
Chloride: 101
Chloride: 103
Chloride: 103
Chloride: 106
Creatinine, Ser: 1.08
Creatinine, Ser: 1.18
Creatinine, Ser: 1.36
Creatinine, Ser: 1.69 — ABNORMAL HIGH
GFR calc Af Amer: 48 — ABNORMAL LOW
GFR calc Af Amer: >60
GFR calc Af Amer: >60
GFR calc Af Amer: >60
GFR calc Af Amer: >60
GFR calc non Af Amer: 51 — ABNORMAL LOW
Potassium: 3.7
Potassium: 3.9
Potassium: 4
Potassium: 4
Sodium: 140
Sodium: 141

## 2011-03-03 LAB — TROPONIN I
Troponin I: 0.02
Troponin I: 0.02

## 2011-03-03 LAB — DIFFERENTIAL
Basophils Absolute: 0
Lymphocytes Relative: 9 — ABNORMAL LOW
Lymphs Abs: 0.3 — ABNORMAL LOW
Neutro Abs: 2.7
Neutrophils Relative %: 74

## 2011-03-03 LAB — CBC
Hemoglobin: 10.3 — ABNORMAL LOW
Hemoglobin: 10.7 — ABNORMAL LOW
MCHC: 33.5
MCHC: 34
MCV: 97.3
MCV: 97.3
MCV: 97.9
Platelets: 170
Platelets: 179
RBC: 3.11 — ABNORMAL LOW
RBC: 3.17 — ABNORMAL LOW
RBC: 3.19 — ABNORMAL LOW
RDW: 13.4
WBC: 3.6 — ABNORMAL LOW
WBC: 4.3
WBC: 4.5
WBC: 5.2

## 2011-03-03 LAB — COMPREHENSIVE METABOLIC PANEL
BUN: 15
CO2: 25
Calcium: 9.3
Creatinine, Ser: 1.06
GFR calc Af Amer: >60
GFR calc non Af Amer: >60
Glucose, Bld: 108 — ABNORMAL HIGH
Total Bilirubin: 0.4

## 2011-03-03 LAB — CK TOTAL AND CKMB (NOT AT ARMC)
CK, MB: 2.5
Relative Index: 1.8
Total CK: 178

## 2011-03-03 LAB — B-NATRIURETIC PEPTIDE (CONVERTED LAB): Pro B Natriuretic peptide (BNP): <30

## 2011-03-03 LAB — HEPARIN LEVEL (UNFRACTIONATED): Heparin Unfractionated: 1.03 — ABNORMAL HIGH

## 2011-03-03 LAB — PROTIME-INR: INR: 1

## 2011-03-03 LAB — APTT: aPTT: 49 — ABNORMAL HIGH

## 2011-03-09 LAB — COMPREHENSIVE METABOLIC PANEL
Albumin: 3.9
BUN: 16
Chloride: 106
Creatinine, Ser: 0.96
GFR calc non Af Amer: >60
Glucose, Bld: 123 — ABNORMAL HIGH
Total Bilirubin: 0.8

## 2011-03-09 LAB — PROTIME-INR
INR: 1
Prothrombin Time: 13.5

## 2011-03-09 LAB — CARDIAC PANEL(CRET KIN+CKTOT+MB+TROPI)
CK, MB: 6.1 — ABNORMAL HIGH
CK, MB: 9.4 — ABNORMAL HIGH
Relative Index: INVALID
Total CK: 182
Total CK: 93
Troponin I: 0.01
Troponin I: 0.01
Troponin I: 0.02

## 2011-03-09 LAB — CK TOTAL AND CKMB (NOT AT ARMC): Relative Index: 7 — ABNORMAL HIGH

## 2011-03-09 LAB — POCT I-STAT, CHEM 8
BUN: 17
Creatinine, Ser: 1.1
Hemoglobin: 12.9 — ABNORMAL LOW
Potassium: 4.1
Sodium: 140

## 2011-03-09 LAB — DIFFERENTIAL
Lymphocytes Relative: 6 — ABNORMAL LOW
Monocytes Absolute: 0.4
Monocytes Relative: 6
Neutro Abs: 5.1

## 2011-03-09 LAB — GLUCOSE, CAPILLARY
Glucose-Capillary: 107 — ABNORMAL HIGH
Glucose-Capillary: 109 — ABNORMAL HIGH
Glucose-Capillary: 98

## 2011-03-09 LAB — CULTURE, BLOOD (ROUTINE X 2)
Culture: NO GROWTH
Culture: NO GROWTH

## 2011-03-09 LAB — LIPID PANEL
HDL: 25 — ABNORMAL LOW
Total CHOL/HDL Ratio: 4.8
VLDL: 18

## 2011-03-09 LAB — POCT CARDIAC MARKERS
CKMB, poc: 2
CKMB, poc: 2.4
Troponin i, poc: 0.08 — ABNORMAL HIGH
Troponin i, poc: <0.05

## 2011-03-09 LAB — HEMOGLOBIN A1C: Hgb A1c MFr Bld: 6

## 2011-03-09 LAB — URINALYSIS, ROUTINE W REFLEX MICROSCOPIC
Bilirubin Urine: NEGATIVE
Glucose, UA: NEGATIVE
Hgb urine dipstick: NEGATIVE
Specific Gravity, Urine: 1.023
pH: 5

## 2011-03-09 LAB — CBC
Hemoglobin: 12.3 — ABNORMAL LOW
RBC: 3.64 — ABNORMAL LOW

## 2011-03-09 LAB — APTT: aPTT: 29

## 2011-03-19 LAB — DIFFERENTIAL
Basophils Absolute: 0
Lymphocytes Relative: 10 — ABNORMAL LOW
Lymphs Abs: 0.5 — ABNORMAL LOW
Neutro Abs: 4.1
Neutrophils Relative %: 79 — ABNORMAL HIGH

## 2011-03-19 LAB — COMPREHENSIVE METABOLIC PANEL
AST: 26
BUN: 15
CO2: 27
Calcium: 9.5
Chloride: 110
Creatinine, Ser: 1.01
GFR calc Af Amer: >60
GFR calc non Af Amer: >60
Total Bilirubin: 0.9

## 2011-03-19 LAB — URINALYSIS, ROUTINE W REFLEX MICROSCOPIC
Bilirubin Urine: NEGATIVE
Nitrite: NEGATIVE
Specific Gravity, Urine: 1.026
Urobilinogen, UA: 1
pH: 5.5

## 2011-03-19 LAB — TYPE AND SCREEN
ABO/RH(D): A POS
Antibody Screen: NEGATIVE

## 2011-03-19 LAB — CBC
HCT: 37.5 — ABNORMAL LOW
MCV: 95.4
Platelets: 154
RDW: 12.9
WBC: 5.2

## 2011-03-19 LAB — ABO/RH: ABO/RH(D): A POS

## 2011-03-19 LAB — TROPONIN I: Troponin I: 0.02

## 2011-03-19 LAB — CK TOTAL AND CKMB (NOT AT ARMC): Relative Index: INVALID

## 2011-03-30 LAB — HM DIABETES EYE EXAM

## 2011-03-31 ENCOUNTER — Encounter: Payer: Self-pay | Admitting: Internal Medicine

## 2011-04-06 ENCOUNTER — Institutional Professional Consult (permissible substitution): Payer: Medicare Other | Admitting: Internal Medicine

## 2011-04-10 ENCOUNTER — Ambulatory Visit (INDEPENDENT_AMBULATORY_CARE_PROVIDER_SITE_OTHER)
Admission: RE | Admit: 2011-04-10 | Discharge: 2011-04-10 | Disposition: A | Discharge status: Home / Self Care | Payer: Medicare Other | Source: Ambulatory Visit | Attending: Internal Medicine | Admitting: Internal Medicine

## 2011-04-10 ENCOUNTER — Ambulatory Visit (INDEPENDENT_AMBULATORY_CARE_PROVIDER_SITE_OTHER): Payer: Medicare Other | Admitting: Internal Medicine

## 2011-04-10 ENCOUNTER — Encounter: Payer: Self-pay | Admitting: Internal Medicine

## 2011-04-10 VITALS — BP 130/58 | HR 56 | Ht 68.0 in | Wt 248.8 lb

## 2011-04-10 DIAGNOSIS — G4733 Obstructive sleep apnea (adult) (pediatric): Secondary | ICD-10-CM

## 2011-04-10 DIAGNOSIS — J449 Chronic obstructive pulmonary disease, unspecified: Secondary | ICD-10-CM

## 2011-04-10 DIAGNOSIS — J4489 Other specified chronic obstructive pulmonary disease: Secondary | ICD-10-CM

## 2011-04-10 NOTE — Patient Instructions (Addendum)
Order- CXR  Dx COPD              Schedule PFT  Stop O2 and CPAP through [facility] in [place], [place]  Establish with [facility] locally  ONO-rest,exertion, and sleep(on CPAP with ROOM AIR)

## 2011-04-10 NOTE — Progress Notes (Signed)
04/10/11- 77 year old male former smoker, Veteran, who had been a patient of mine in the old [employer]. He is coming to reestablish after moving back from [place] to [place]. History of asthma, COPD, obstructive sleep apnea complicated by DM, obesity, CAD/ischemic CM/sick sinus/bradycardia tachycardia with pacemaker, history of prostate cancer, anemia. He reports feeling stable today. New carpet smell in his apartment is irritating to his breathing but he denies routine cough or recent bronchitis. He has not felt the need to use his bronchodilators in 4 months. Walking distances limited more by degenerative joint hip pain. He can walk a maximum of 10 minutes on level ground and very little on hills or stairs. He denies blood, adenopathy, discolored sputum or swelling. He usually has trace ankle edema with no history of venous thromboembolic disease. CAD treated with CABG, stent and pacemaker but he has not had infarction. Did have pneumonia at age 58. He declines flu vaccine. Has had pneumonia vaccine. Long-term diagnosis of sleep apnea. NPSG 01/27/06-AHI 59.9 per hour. CPAP 12 plus oxygen 2 L for sleep, with good compliance and control. He will want to change to a local provider.  ROS-see HPI Constitutional:   No-   weight loss, night sweats, fevers, chills, fatigue, lassitude. HEENT:   No-  headaches, difficulty swallowing, tooth/dental problems, sore throat,       No-  sneezing, itching, ear ache, nasal congestion, post nasal drip,  CV:  No- recent  chest pain, orthopnea, PND. Mild chronic swelling in lower extremities,No- anasarca, dizziness, palpitations Resp: No- acute  shortness of breath with exertion or at rest.              No-   productive cough,  No non-productive cough,  No- coughing up of blood.              No-   change in color of mucus.  No- wheezing.   Skin: No-   rash or lesions. GI:  No-   heartburn, indigestion, abdominal pain, nausea, vomiting, diarrhea,                 change in  bowel habits, loss of appetite GU: No-   dysuria, change in color of urine, no urgency or frequency.  No- flank pain. MS:  No-   joint pain or swelling.  No- decreased range of motion.  No- back pain. Neuro-     nothing unusual Psych:  No- change in mood or affect. No depression or anxiety.  No memory loss.  OBJ General- Alert, Oriented, Affect-appropriate, Distress- none acute Skin- rash-none, lesions- none, excoriation- none Lymphadenopathy- none Head- atraumatic            Eyes- Gross vision intact, PERRLA, conjunctivae clear secretions            Ears- Hearing, canals-normal            Nose- Clear, no-Septal dev, mucus, polyps, erosion, perforation             Throat- Mallampati II , mucosa clear , drainage- none, tonsils- atrophic Neck- flexible , trachea midline, no stridor , thyroid nl, carotid no bruit Chest - symmetrical excursion , unlabored           Heart/CV- RRR , no murmur , no gallop  , no rub, nl s1 s2                           - JVD- none , edema- none, stasis  changes- none, varices- none           Lung- clear to P&A, wheeze- none, cough- none , dullness-none, rub- none           Chest wall-  Abd- tender-no, distended-no, bowel sounds-present, HSM- no Br/ Gen/ Rectal- Not done, not indicated Extrem- cyanosis- none, clubbing, none, atrophy- none, strength- nl Neuro- grossly intact to observation

## 2011-04-11 DIAGNOSIS — G4733 Obstructive sleep apnea (adult) (pediatric): Secondary | ICD-10-CM | POA: Insufficient documentation

## 2011-04-11 NOTE — Assessment & Plan Note (Signed)
He seems to have very little active obstructive airways disease. Will want to confirm this with full pulmonary function testing. Exertion is limited much more by obesity with deconditioning, cardiac disease, arthritis with pain in his hip. Plan chest x-ray. No changes in medications for now. He is not using any for breathing but has them available.

## 2011-04-11 NOTE — Assessment & Plan Note (Signed)
Good CPAP compliance and control. Weight loss is encouraged and would make a significant difference.

## 2011-04-15 NOTE — Progress Notes (Signed)
Quick Note:  Pt aware of results. ______ 

## 2011-04-15 NOTE — Progress Notes (Signed)
Quick Note:  LMTCB ______ 

## 2011-04-23 ENCOUNTER — Ambulatory Visit (INDEPENDENT_AMBULATORY_CARE_PROVIDER_SITE_OTHER): Payer: Medicare Other | Admitting: Internal Medicine

## 2011-04-23 ENCOUNTER — Encounter: Payer: Self-pay | Admitting: Internal Medicine

## 2011-04-23 ENCOUNTER — Other Ambulatory Visit (INDEPENDENT_AMBULATORY_CARE_PROVIDER_SITE_OTHER): Payer: Medicare Other

## 2011-04-23 DIAGNOSIS — I10 Essential (primary) hypertension: Secondary | ICD-10-CM

## 2011-04-23 DIAGNOSIS — E119 Type 2 diabetes mellitus without complications: Secondary | ICD-10-CM

## 2011-04-23 DIAGNOSIS — E785 Hyperlipidemia, unspecified: Secondary | ICD-10-CM

## 2011-04-23 DIAGNOSIS — M25559 Pain in unspecified hip: Secondary | ICD-10-CM

## 2011-04-23 DIAGNOSIS — M25551 Pain in right hip: Secondary | ICD-10-CM

## 2011-04-23 DIAGNOSIS — M545 Low back pain: Secondary | ICD-10-CM

## 2011-04-23 DIAGNOSIS — J449 Chronic obstructive pulmonary disease, unspecified: Secondary | ICD-10-CM

## 2011-04-23 LAB — PULMONARY FUNCTION TEST

## 2011-04-23 LAB — HEMOGLOBIN A1C: Hgb A1c MFr Bld: 6.8 % — ABNORMAL HIGH (ref 4.6–6.5)

## 2011-04-23 NOTE — Progress Notes (Signed)
PFT done today. 

## 2011-04-23 NOTE — Assessment & Plan Note (Signed)
new DES 08/2010, hx same and CABG 1995 On statin - last lipids at goal The current medical regimen is effective;  continue present plan and medications.

## 2011-04-23 NOTE — Assessment & Plan Note (Signed)
Diet controlled, no longer follows with endo (prev kerr) Check a1c q3 mo - due now Intolerant of metformin due to nausea and "hospitalization"  Lab Results  Component Value Date   HGBA1C 7.1* 01/29/2011

## 2011-04-23 NOTE — Progress Notes (Signed)
Subjective:    Patient ID: Eric [name], male    DOB: 08/20/1926, 77 y.o.   MRN: [id number]  HPI  here for follow up - routine care also at [facility] - reviewed chronic medical issues:  CAD, hx CABG and multiple PTCA/DES - hosp 08/2010 reviewed with resolution of shoulder pain following DES. Follows with local cards ([facility]) - no residual CP/neck or shoulder symptoms  - the patient reports compliance with medication(s) as prescribed. Denies adverse side effects.  chronic low back pain and DDD - hx falls early 2011but none since starting steroid shots (ramos) has undergone injection to right hip with near 100% resolution of pain and walking/balance problems- completed work with PT for balance -  DM2, diet controlled -  Previously stopped Actos due to fluid retention and rash- Follows with endo, kerr q76mo- off all meds including metformin checks sugars infreq now - only goes over 120 if eat something sweet   HTN - reports compliance with ongoing medical treatment and no changes in medication dose or frequency. denies adverse side effects related to current therapy.   dyslipidemia - on statin - reports compliance with ongoing medical treatment and no changes in medication dose or frequency. denies adverse side effects related to current therapy.   GERD - reports compliance with ongoing medical treatment and no changes in medication dose or frequency. denies adverse side effects related to current therapy. requests samples nexium if available  "nerves" - anxious about move to senior apt in [place] summer 2012 - low dose prn xanax rx 01/2011, not needing same now  Past Medical History  Diagnosis Date  . LACTOSE INTOLERANCE   . OBESITY   . CORONARY ARTERY DISEASE     CABG 1995, PTCA/DES 2008, 2009 and 08/2010  . CARDIOMYOPATHY, ISCHEMIC   . AORTIC SCLEROSIS   . SICK SINUS/ TACHY-BRADY SYNDROME 09/2007    s/p PPM st judes  . PERIPHERAL VASCULAR DISEASE   . CAROTID BRUIT, RIGHT 02/27/2008  .  Asthma   . IBS (irritable bowel syndrome)   . ALLERGIC RHINITIS   . ANEMIA-NOS   . OA (osteoarthritis)   . Prostate cancer   . COPD   . DIABETES MELLITUS-TYPE II   . GERD   . HYPERLIPIDEMIA   . HYPERTENSION   . HIATAL HERNIA     Review of Systems  Constitutional: Negative for fever and fatigue.  Respiratory: Negative for shortness of breath.   Genitourinary: Negative for difficulty urinating.  Musculoskeletal: Negative for myalgias.       Objective:   Physical Exam  BP 130/52  Pulse 60  Temp(Src) 97.4 F (36.3 C) (Oral)  Ht 5\' 3"  (1.6 m)  Wt 246 lb 6.4 oz (111.766 kg)  BMI 43.65 kg/m2  SpO2 90% Constitutional:  Overweight. appears well-developed and well-nourished. No distress.  Neck: thick and short, Normal range of motion. Neck supple. No JVD present. No thyromegaly present.  Cardiovascular: Normal rate, regular rhythm and normal heart sounds.  No murmur heard. trace BLE edema Pulmonary/Chest: Effort normal and breath sounds normal. No respiratory distress. no wheezes.  Neurological: he is alert and oriented to person, place, and time. No cranial nerve deficit. Coordination normal.  Psychiatric: he has a normal mood and affect. behavior is normal. Judgment and thought content normal.      Lab Results  Component Value Date   WBC 5.2 12/18/2010   HGB 12.5* 12/18/2010   HCT 36.3* 12/18/2010   PLT 141.0* 12/18/2010   CHOL 100 10/23/2010  TRIG 109.0 10/23/2010   HDL 33.80* 10/23/2010   ALT 19 12/18/2010   AST 23 12/18/2010   NA 138 12/18/2010   K 4.3 12/18/2010   CL 102 12/18/2010   CREATININE 1.0 12/18/2010   BUN 14 12/18/2010   CO2 27 12/18/2010   TSH 1.19 04/28/2010   PSA 0.02* 09/10/2006   INR 1.0 12/11/2008   HGBA1C 7.1* 01/29/2011   MICROALBUR 0.5 04/18/2009    Assessment & Plan:  See problem list. Medications and labs reviewed today.

## 2011-04-23 NOTE — Assessment & Plan Note (Signed)
BP Readings from Last 3 Encounters:  04/23/11 130/52  04/10/11 130/58  01/29/11 132/52   The current medical regimen is effective;  continue present plan and medications.

## 2011-04-23 NOTE — Assessment & Plan Note (Signed)
History of surgical intervention July 2010 for same -  following with [facility] for same but feels symptoms unimproved with ongoing care and ESI by Dr. Glenford Bayley Increasing symptoms of right leg pain, unclear by history if this is hip or back etiology Patient requests second opinion on evaluation and management of same Will refer to different orthopedic group at patient request

## 2011-04-23 NOTE — Patient Instructions (Signed)
It was good to see you today. Test(s) ordered today. Your results will be called to you after review (48-72hours after test completion). If any changes need to be made, you will be notified at that time. Medications reviewed, no changes at this time -  Please schedule followup in 4 months for diabetes and cholesterol check, call sooner if problems.

## 2011-04-24 ENCOUNTER — Telehealth: Payer: Self-pay

## 2011-04-24 NOTE — Telephone Encounter (Signed)
Pt called requesting a second opinion regarding hip pain. Pt is requesting a MD outside of [facility].

## 2011-04-24 NOTE — Telephone Encounter (Signed)
Patient and I discussed this at his office visit yesterday ! I have already placed a referral to [name] for a second opinion. [name] should be calling him soon- Thanks

## 2011-04-28 ENCOUNTER — Telehealth: Payer: Self-pay | Admitting: Internal Medicine

## 2011-04-28 NOTE — Telephone Encounter (Signed)
Will forward to Katie to keep an eye out for paperwork

## 2011-05-07 NOTE — Telephone Encounter (Signed)
I spoke with patient-he has not brought paperwork by as of this time; he will bring the paperwork by tomorrow morning.

## 2011-05-08 NOTE — Telephone Encounter (Signed)
Paperwork and message on CY's cart to fill out.

## 2011-05-08 NOTE — Telephone Encounter (Addendum)
Pt dropped off paperwork concerning his request to change apartment.  I gave paperwork to [facility].  Please notify pt when completed.  Pt can be reached at [phone] (home) or [phone] (cell).   Eric [name]

## 2011-05-12 NOTE — Telephone Encounter (Signed)
Pt is aware that paperwork is ready for pick up-will come by tomorrow morning and ask for me to get paperwork. Copy sent to medical records to scan in EPIC.

## 2011-06-10 ENCOUNTER — Telehealth: Payer: Self-pay | Admitting: Internal Medicine

## 2011-06-10 NOTE — Telephone Encounter (Signed)
Received copies from [employer] 1.2.13. Forwarded 2 pages to Dr. Felicity Coyer ,for review. sj

## 2011-06-23 DIAGNOSIS — Z950 Presence of cardiac pacemaker: Secondary | ICD-10-CM | POA: Diagnosis not present

## 2011-06-23 DIAGNOSIS — I495 Sick sinus syndrome: Secondary | ICD-10-CM | POA: Diagnosis not present

## 2011-06-23 DIAGNOSIS — I10 Essential (primary) hypertension: Secondary | ICD-10-CM | POA: Diagnosis not present

## 2011-06-23 DIAGNOSIS — Z01810 Encounter for preprocedural cardiovascular examination: Secondary | ICD-10-CM | POA: Diagnosis not present

## 2011-06-23 DIAGNOSIS — E119 Type 2 diabetes mellitus without complications: Secondary | ICD-10-CM | POA: Diagnosis not present

## 2011-06-23 DIAGNOSIS — M25559 Pain in unspecified hip: Secondary | ICD-10-CM | POA: Diagnosis not present

## 2011-06-23 DIAGNOSIS — J449 Chronic obstructive pulmonary disease, unspecified: Secondary | ICD-10-CM | POA: Diagnosis not present

## 2011-06-23 DIAGNOSIS — I2510 Atherosclerotic heart disease of native coronary artery without angina pectoris: Secondary | ICD-10-CM | POA: Diagnosis not present

## 2011-06-25 ENCOUNTER — Telehealth: Payer: Self-pay | Admitting: Internal Medicine

## 2011-06-25 ENCOUNTER — Encounter: Payer: Self-pay | Admitting: Nurse Practitioner

## 2011-06-25 ENCOUNTER — Ambulatory Visit (INDEPENDENT_AMBULATORY_CARE_PROVIDER_SITE_OTHER): Payer: Medicare Other | Admitting: Nurse Practitioner

## 2011-06-25 ENCOUNTER — Other Ambulatory Visit (INDEPENDENT_AMBULATORY_CARE_PROVIDER_SITE_OTHER): Payer: Medicare Other

## 2011-06-25 VITALS — BP 150/60 | HR 66 | Ht 68.0 in | Wt 241.0 lb

## 2011-06-25 DIAGNOSIS — K5732 Diverticulitis of large intestine without perforation or abscess without bleeding: Secondary | ICD-10-CM

## 2011-06-25 DIAGNOSIS — K5792 Diverticulitis of intestine, part unspecified, without perforation or abscess without bleeding: Secondary | ICD-10-CM

## 2011-06-25 LAB — CBC WITH DIFFERENTIAL/PLATELET
Basophils Absolute: 0 10*3/uL (ref 0.0–0.1)
Eosinophils Absolute: 0.2 10*3/uL (ref 0.0–0.7)
HCT: 38 % — ABNORMAL LOW (ref 39.0–52.0)
Hemoglobin: 12.9 g/dL — ABNORMAL LOW (ref 13.0–17.0)
Lymphs Abs: 0.6 10*3/uL — ABNORMAL LOW (ref 0.7–4.0)
MCHC: 33.9 g/dL (ref 30.0–36.0)
MCV: 96.8 fl (ref 78.0–100.0)
Neutro Abs: 3.7 10*3/uL (ref 1.4–7.7)
RDW: 13.4 % (ref 11.5–14.6)

## 2011-06-25 MED ORDER — AMOXICILLIN-POT CLAVULANATE 875-125 MG PO TABS: 1.0000 | ORAL_TABLET | Freq: Two times a day (BID) | ORAL | Status: AC

## 2011-06-25 MED ORDER — TRAMADOL HCL 50 MG PO TABS: 50.0000 mg | ORAL_TABLET | Freq: Four times a day (QID) | ORAL | Status: AC | PRN

## 2011-06-25 NOTE — Patient Instructions (Signed)
We sent a prescription for the Augmentin generic antibiotic to [facility]. We also sent one for the pain medication, Ultram.  Stick to a clear liquid diet for 2-3 days.   Call us Monday 06-29-2011 and let us how you are doing.  You may ask for Annelies Coyt. [phone]. Over the weekend if you get a fever or worsening pain you should go to the Emergency Room.  Once he starts to feel better.  Start Miralax once daily in the morning.  You can get Miralax at any pharmacy , [facility], [facility],  [place].

## 2011-06-25 NOTE — Telephone Encounter (Signed)
Patient with a 1 week history of LLQ pain and constipation.  He has been taking hydrocodone for a heel injury and is caused worsening of his constipation.  He has tried a low residue diet with no improvement.  He denies fever or nausea or vomiting.  He will come in this pm at 3:00 and see Willette Cluster RNP

## 2011-06-29 ENCOUNTER — Encounter: Payer: Self-pay | Admitting: Nurse Practitioner

## 2011-06-29 NOTE — Progress Notes (Signed)
EVALUATION ASSESSMENT AND PLANS NOTED. AGREE

## 2011-06-29 NOTE — Progress Notes (Signed)
Eric [name] [id number] 1926-06-21   HISTORY OR PRESENT ILLNESS : Patient is an 78 year old male known to Dr.Gessner for a history of diverticular disease, partial small bowel obstructions and IBS with bloating.  Patient is here for evaluation of LLQ pain and constipation.  He has been taking Hydrocodone which has likely contributed to constipation. No nausea. He denies bloating. Patient states pain feels like recurrent diverticulitis. No urinary symptoms. No fevers.  Current Medications, Allergies, Past Medical History, Past Surgical History, Family History and Social History were reviewed in [employer] record.   PHYSICAL EXAMINATION : General: Obese, white male in no acute distress Head: Normocephalic and atraumatic Eyes:  sclerae anicteric,conjunctive pink. Ears: Normal auditory acuity Neck: Supple, no masses.  Lungs: Clear throughout to auscultation Heart: Regular rate and rhythm Abdomen: Soft, obese, nondistended, moderate LLQ tenderness, no peritoneal signs. No masses appreciated. Normal bowel sounds Rectal: No stool in vault. Musculoskeletal: Symmetrical with no gross deformities  Skin: No lesions on visible extremities Extremities: No edema or deformities noted Neurological: Oriented x 4, grossly nonfocal Cervical Nodes:  No significant cervical adenopathy Psychological:  Alert and cooperative. Normal mood and affect  ASSESSMENT AND PLAN : 77.  77 year old male with recurrent LLQ pain which he states feels like recurrent diverticulitis again. He has moderate LLQ tenderness on exam today. Patient has known history of diverticulitis but I cannot find where CTscans in past have shown diverticulitis. At any rate, patient states his symptoms always respond to antibiotics. He is apparently allergic to Cipro. Will treat him with a course of Augmentin and obtain basic labs today. Advised patient to stay on clear liquids for next 2-3 days. Patient will call us on  Monday with condition update.  2. Constipation, likely related to Hydrocodone. Begin daily Miralax.

## 2011-06-30 ENCOUNTER — Telehealth: Payer: Self-pay | Admitting: *Deleted

## 2011-06-30 NOTE — Telephone Encounter (Signed)
Patient given results and he is feeling a lot better.

## 2011-06-30 NOTE — Telephone Encounter (Signed)
Message copied by Daphine Deutscher on Tue Jun 30, 2011 11:44 AM ------      Message from: Meredith Pel      Created: Mon Jun 29, 2011 10:10 PM       Rene Kocher, please let patient know labs at baseline. Is he feeling better? Thanks

## 2011-06-30 NOTE — Telephone Encounter (Signed)
Left a message for patient to call me. 

## 2011-07-03 ENCOUNTER — Ambulatory Visit: Payer: Medicare Other | Admitting: Internal Medicine

## 2011-07-08 ENCOUNTER — Other Ambulatory Visit: Payer: Self-pay | Admitting: Otolaryngology

## 2011-07-10 ENCOUNTER — Encounter: Payer: Self-pay | Admitting: Internal Medicine

## 2011-07-10 ENCOUNTER — Ambulatory Visit (INDEPENDENT_AMBULATORY_CARE_PROVIDER_SITE_OTHER): Payer: Medicare Other | Admitting: Internal Medicine

## 2011-07-10 ENCOUNTER — Telehealth: Payer: Self-pay | Admitting: Internal Medicine

## 2011-07-10 VITALS — BP 120/68 | HR 60 | Temp 97.0°F | Wt 241.8 lb

## 2011-07-10 DIAGNOSIS — Z01818 Encounter for other preprocedural examination: Secondary | ICD-10-CM | POA: Diagnosis not present

## 2011-07-10 DIAGNOSIS — E119 Type 2 diabetes mellitus without complications: Secondary | ICD-10-CM | POA: Diagnosis not present

## 2011-07-10 DIAGNOSIS — I2510 Atherosclerotic heart disease of native coronary artery without angina pectoris: Secondary | ICD-10-CM | POA: Diagnosis not present

## 2011-07-10 DIAGNOSIS — G4733 Obstructive sleep apnea (adult) (pediatric): Secondary | ICD-10-CM | POA: Diagnosis not present

## 2011-07-10 NOTE — Assessment & Plan Note (Signed)
Good CPAP compliance. Uses oxygen at night Follows with pulmonary for same, recommend clearance prior to surgery given possible anesthesia risk

## 2011-07-10 NOTE — Telephone Encounter (Signed)
Error.  Made appt w/ CY instead.  Eric [name]

## 2011-07-10 NOTE — Assessment & Plan Note (Signed)
Diet controlled, no longer follows with endo (prev kerr) Intolerant of metformin due to nausea and "hospitalization"  Lab Results  Component Value Date   HGBA1C 6.8* 04/23/2011

## 2011-07-10 NOTE — Assessment & Plan Note (Signed)
Follows with Dr. Katrinka Blazing for same - reports evaluation and clearance done last week Stent 08/2010 after evaluation for anginal symptoms, ? Recurrence of similar shoulder discomfort now EKG declined by patient today as reports done at Dr. Michaelle Copas office last week Defer final medical clearance to cardiology prior to proceeding with surgery plans

## 2011-07-10 NOTE — Progress Notes (Signed)
Subjective:    Patient ID: Eric [name], male    DOB: 1926-11-01, 78 y.o.   MRN: [id number]  HPI  Here for preop clearance - requested by Dr. Shelle Iron and planning [facility] 08/2010 no chest pain but L shoulder ache similar to prior angina symptoms before stent 08/2010 (saw Dr. Katrinka Blazing last week for same) No change in edema - no change in shortness of breath or dyspnea on exertion  reviewed history of coronary artery disease , OSA, COPD and diabetes mellitus  - no CKD pt able to walk 2 blocks without fatigue (but limited by pain in knees, especially if climbing stairs)    routine care also at [place] - reviewed chronic medical issues:  CAD, hx CABG and multiple PTCA/DES - hosp 08/2010 reviewed with resolution of shoulder pain following DES. Follows with local cards ([facility]) - no residual CP/neck or shoulder symptoms  - the patient reports compliance with medication(s) as prescribed. Denies adverse side effects.  chronic low back pain and DDD - hx falls early 2011but none since starting steroid shots (ramos) has undergone injection to right hip with near 100% resolution of pain and walking/balance problems- completed work with PT for balance -  DM2, diet controlled -  Previously stopped Actos due to fluid retention and rash- Prev followed with endo, kerr q68mo- but off all meds including metformin so follows DM here checks sugars infreq - only goes over 120 if eat something sweet   HTN - reports compliance with ongoing medical treatment and no changes in medication dose or frequency. denies adverse side effects related to current therapy.   dyslipidemia - on statin - reports compliance with ongoing medical treatment and no changes in medication dose or frequency. denies adverse side effects related to current therapy.   GERD - reports compliance with ongoing medical treatment and no changes in medication dose or frequency. denies adverse side effects related to current therapy. requests samples nexium  if available  "nerves" - anxious about move to senior apt in [place] summer 2012 - low dose prn xanax rx 01/2011, not needing same now  Past Medical History  Diagnosis Date  . LACTOSE INTOLERANCE   . OBESITY   . CORONARY ARTERY DISEASE     CABG 1995, PTCA/DES 2008, 2009 and 08/2010  . CARDIOMYOPATHY, ISCHEMIC   . AORTIC SCLEROSIS   . SICK SINUS/ TACHY-BRADY SYNDROME 09/2007    s/p PPM st judes  . PERIPHERAL VASCULAR DISEASE   . CAROTID BRUIT, RIGHT 02/27/2008  . Asthma   . IBS (irritable bowel syndrome)   . ALLERGIC RHINITIS   . ANEMIA-NOS   . OA (osteoarthritis)   . Prostate cancer   . COPD   . DIABETES MELLITUS-TYPE II   . GERD   . HYPERLIPIDEMIA   . HYPERTENSION   . HIATAL HERNIA   . Diverticulosis     Review of Systems  Constitutional: Negative for fever and fatigue.  Respiratory: Negative for cough, shortness of breath and wheezing.   Cardiovascular: Negative for chest pain, palpitations and leg swelling.  No other specific complaints in a complete review of systems (except as listed in HPI above).      Objective:   Physical Exam  BP 120/68  Pulse 60  Temp(Src) 97 F (36.1 C) (Oral)  Wt 241 lb 12.8 oz (109.68 kg)  SpO2 97% Constitutional:  Overweight. appears well-developed and well-nourished. No distress at rest - uncomfortable with ambulation due to severe hip pain - ambulates with cane asst.  Neck: thick and short, Normal range of motion. Neck supple. No JVD present. No thyromegaly present.  Cardiovascular: Normal rate, regular rhythm and normal heart sounds.  No murmur heard. trace BLE edema Pulmonary/Chest: Effort normal and breath sounds normal. No respiratory distress. no wheezes.  Neurological: he is alert and oriented to person, place, and time. No cranial nerve deficit. Coordination normal.  Psychiatric: he has a normal mood and affect. behavior is normal. Judgment and thought content normal.      Lab Results  Component Value Date   WBC 5.2 06/25/2011     HGB 12.9* 06/25/2011   HCT 38.0* 06/25/2011   PLT 189.0 06/25/2011   CHOL 100 10/23/2010   TRIG 109.0 10/23/2010   HDL 33.80* 10/23/2010   ALT 19 12/18/2010   AST 23 12/18/2010   NA 138 12/18/2010   K 4.3 12/18/2010   CL 102 12/18/2010   CREATININE 1.0 12/18/2010   BUN 14 12/18/2010   CO2 27 12/18/2010   TSH 1.19 04/28/2010   PSA 0.02* 09/10/2006   INR 1.0 12/11/2008   HGBA1C 6.8* 04/23/2011   MICROALBUR 0.5 04/18/2009   EKG:  Declined here by pt today as reports done last week with cards, Dr Katrinka Blazing  Assessment & Plan:  medical preoperative clearance - planning total hip replacement March 2013 with Dr. Shelle Iron - significant risk factors include coronary disease, COPD, obst sleep apnea and diabetes. - Recommend reviewing clearance by pulmonary and cardiologist prior to planned surgery - Assuming this patient has been evaluated and cleared by his other specialists , I feel that the surgical risk is low and outweighed by the potential benefit of the surgery (relief from terrible pain). Therefore, medically clear to proceed when scheduling allows once cleared by pulmonary and cardiology.

## 2011-07-10 NOTE — Patient Instructions (Signed)
You've been evaluated today for medical clearance prior to hip replacement surgery March 2013 I recommend further clearance by Dr. Katrinka Blazing for your heart and Dr. Maple Hudson for your lungs given COPD, oxygen needs and obstructive sleep apnea I feel your risk for surgery complication is low and outweighed by the potential benefit of surgery to relieve your terrible pain.  You are free to proceed as scheduled after receiving clearance by cardiology and pulmonary Forms from orthopedics/Dr. Jillyn Hidden have been completed today and will be returned to their office

## 2011-07-13 DIAGNOSIS — E669 Obesity, unspecified: Secondary | ICD-10-CM | POA: Diagnosis not present

## 2011-07-13 DIAGNOSIS — E1142 Type 2 diabetes mellitus with diabetic polyneuropathy: Secondary | ICD-10-CM | POA: Diagnosis not present

## 2011-07-13 DIAGNOSIS — E1149 Type 2 diabetes mellitus with other diabetic neurological complication: Secondary | ICD-10-CM | POA: Diagnosis not present

## 2011-07-13 DIAGNOSIS — B353 Tinea pedis: Secondary | ICD-10-CM | POA: Diagnosis not present

## 2011-07-14 ENCOUNTER — Ambulatory Visit (INDEPENDENT_AMBULATORY_CARE_PROVIDER_SITE_OTHER): Payer: Medicare Other | Admitting: Internal Medicine

## 2011-07-14 ENCOUNTER — Encounter: Payer: Self-pay | Admitting: Internal Medicine

## 2011-07-14 VITALS — BP 116/70 | HR 67 | Ht 68.0 in | Wt 237.2 lb

## 2011-07-14 DIAGNOSIS — J449 Chronic obstructive pulmonary disease, unspecified: Secondary | ICD-10-CM

## 2011-07-14 DIAGNOSIS — Z950 Presence of cardiac pacemaker: Secondary | ICD-10-CM | POA: Diagnosis not present

## 2011-07-14 DIAGNOSIS — G4733 Obstructive sleep apnea (adult) (pediatric): Secondary | ICD-10-CM

## 2011-07-14 DIAGNOSIS — J4489 Other specified chronic obstructive pulmonary disease: Secondary | ICD-10-CM | POA: Diagnosis not present

## 2011-07-14 NOTE — Patient Instructions (Signed)
Order- [facility]-   ONOX on room air with CPAP  For dx OSA, COPD

## 2011-07-14 NOTE — Progress Notes (Signed)
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 He is coming to reestablish after moving back from [place] to [place]. History of asthma, COPD, obstructive sleep apnea complicated by DM, obesity, CAD/ischemic CM/sick sinus/bradycardia tachycardia with pacemaker, history of prostate cancer, anemia. He reports feeling stable today. New carpet smell in his apartment is irritating to his breathing but he denies routine cough or recent bronchitis. He has not felt the need to use his bronchodilators in 4 months. Walking distances limited more by degenerative joint hip pain. He can walk a maximum of 10 minutes on level ground and very little on hills or stairs. He denies blood, adenopathy, discolored sputum or swelling. He usually has trace ankle edema with no history of venous thromboembolic disease. CAD treated with CABG, stent and pacemaker but he has not had infarction. Did have pneumonia at age 63. He declines flu vaccine. Has had pneumonia vaccine. Long-term diagnosis of sleep apnea. NPSG 01/27/06-AHI 59.9 per hour. CPAP 12 plus oxygen 2 L for sleep, with good compliance and control. He will want to change to a local provider.  07/14/11- 78 year old male former smoker, Veteran,  History of asthma, COPD, obstructive sleep apnea complicated by DM, obesity, CAD/ischemic CM/sick sinus/bradycardia tachycardia with pacemaker, history of prostate cancer, anemia He had his first flu vaccine in 10 years this winter and avoided any significant respiratory infection. He no longer thinks he needs daytime oxygen. He is wearing it only at night, with his CPAP. Scheduled for right hip replacement in March. Chest x-ray 04/15/2011-no acute process, pacemaker PFT 04/23/2011-mild COPD  ROS-see HPI Constitutional:   No-   weight loss, night sweats, fevers, chills, fatigue, lassitude. HEENT:   No-  headaches, difficulty swallowing, tooth/dental problems, sore throat,    No-  sneezing, itching, ear ache, nasal congestion, post nasal drip,  CV:  No- recent  chest pain, orthopnea, PND. Mild chronic swelling in lower extremities,No- anasarca, dizziness, palpitations Resp: No- acute  shortness of breath with exertion or at rest.              No-   productive cough,  No non-productive cough,  No- coughing up of blood.              No-   change in color of mucus.  No- wheezing.   Skin: No-   rash or lesions. GI:  No-   heartburn, indigestion, abdominal pain, nausea, vomiting, diarrhea,                 change in bowel habits, loss of appetite GU:  MS:  No-   joint pain or swelling.  No- decreased range of motion.  No- back pain. Neuro-     nothing unusual Psych:  No- change in mood or affect. No depression or anxiety.  No memory loss.  OBJ General- Alert, Oriented, Affect-appropriate, Distress- none acute, overweight Skin- rash-none, lesions- none, excoriation- none Lymphadenopathy- none Head- atraumatic            Eyes- Gross vision intact, PERRLA, conjunctivae clear secretions            Ears- Hearing aids            Nose- Clear, no-Septal dev, mucus, polyps, erosion, perforation             Throat- Mallampati II , mucosa clear , drainage- none, tonsils- atrophic Neck- flexible , trachea midline, no stridor , thyroid nl, carotid no bruit Chest -  symmetrical excursion , unlabored           Heart/CV- RRR , no murmur , no gallop  , no rub, nl s1 s2                           - JVD- none , edema- none, stasis changes- none, varices- none           Lung- basilar crackles, wheeze- none, cough- none , dullness-none, rub- none           Chest wall-  Abd- Br/ Gen/ Rectal- Not done, not indicated Extrem- cyanosis- none, clubbing, none, atrophy- none, strength- deconditioned, cane Neuro- grossly intact to observation

## 2011-07-17 ENCOUNTER — Encounter: Payer: Self-pay | Admitting: Internal Medicine

## 2011-07-17 ENCOUNTER — Telehealth: Payer: Self-pay | Admitting: Internal Medicine

## 2011-07-17 NOTE — Telephone Encounter (Signed)
I spoke with pt and he states that [facility] never received his order for his ono on cpap. I advised we did send it over but will refax this to them. I have faxed order over to [facility]

## 2011-07-17 NOTE — Assessment & Plan Note (Signed)
Mild obstructive airways disease. Activity is substantially limited now by hip pain before he has to stop for dyspnea. He is also substantially overweight and deconditioned. Plan-reassess nighttime oxygen saturation on CPAP with room air to determine if he still needs home oxygen.

## 2011-07-17 NOTE — Assessment & Plan Note (Signed)
He continues good compliance and control on CPAP.

## 2011-07-22 DIAGNOSIS — J449 Chronic obstructive pulmonary disease, unspecified: Secondary | ICD-10-CM | POA: Diagnosis not present

## 2011-07-27 ENCOUNTER — Telehealth: Payer: Self-pay | Admitting: Internal Medicine

## 2011-07-27 NOTE — Telephone Encounter (Signed)
[email or web address] advising will have to see with CDY to see if he has received ONO results yet. Please advise Dr. Maple Hudson, thanks

## 2011-07-27 NOTE — Telephone Encounter (Signed)
Pt calling for ONO results. He said this was done on 07/20/11 through [facility]. Pt wants CDY to know that he did not sleep well the night of the test due to hip pain. When calling back, try home phone first and if no answer please try his cell, [phone].

## 2011-07-27 NOTE — Telephone Encounter (Signed)
Placed ONO from [facility] (faxed to me) and message on CY's cart for advice.

## 2011-07-27 NOTE — Telephone Encounter (Signed)
Returned triage's call.  Holly D Pryor ° °

## 2011-07-28 ENCOUNTER — Telehealth: Payer: Self-pay | Admitting: Internal Medicine

## 2011-07-28 NOTE — Telephone Encounter (Signed)
His ONOX was done 07/20/11- on CPAP w/ room air. He reports sleeping poorly due to hip pain, and has surgery pending in March for that. His ONOX qualifies him to continue O2 for sleep through his CPAP and that is what he wishes to do.  Symptomatic hypoxemia, due to COPD and cardiomyopathy w/ CHF, edema.

## 2011-07-30 ENCOUNTER — Encounter: Payer: Self-pay | Admitting: Internal Medicine

## 2011-08-04 NOTE — Telephone Encounter (Signed)
ATC again and the call was dropped, Will await for him to call us again

## 2011-08-04 NOTE — Telephone Encounter (Signed)
See note below:  Eric Budge, MD 07/28/2011 8:26 AM Signed  His ONOX was done 07/20/11- on CPAP w/ room air. He reports sleeping poorly due to hip pain, and has surgery pending in March for that.  His ONOX qualifies him to continue O2 for sleep through his CPAP and that is what he wishes to do.  Symptomatic hypoxemia, due to COPD and cardiomyopathy w/ CHF, edema.   I attempted to call pt x 3 times

## 2011-08-04 NOTE — Telephone Encounter (Signed)
Pt.notified

## 2011-08-04 NOTE — Telephone Encounter (Signed)
Pt returned Katie's call.  Stated that his phone 3 dropped Katie's call 3 times.  Antionette Fairy

## 2011-08-05 DIAGNOSIS — I2510 Atherosclerotic heart disease of native coronary artery without angina pectoris: Secondary | ICD-10-CM | POA: Diagnosis not present

## 2011-08-05 DIAGNOSIS — I10 Essential (primary) hypertension: Secondary | ICD-10-CM | POA: Diagnosis not present

## 2011-08-05 DIAGNOSIS — E782 Mixed hyperlipidemia: Secondary | ICD-10-CM | POA: Diagnosis not present

## 2011-08-05 DIAGNOSIS — Z950 Presence of cardiac pacemaker: Secondary | ICD-10-CM | POA: Diagnosis not present

## 2011-08-05 DIAGNOSIS — M25559 Pain in unspecified hip: Secondary | ICD-10-CM | POA: Diagnosis not present

## 2011-08-05 DIAGNOSIS — Z01810 Encounter for preprocedural cardiovascular examination: Secondary | ICD-10-CM | POA: Diagnosis not present

## 2011-08-05 DIAGNOSIS — R079 Chest pain, unspecified: Secondary | ICD-10-CM | POA: Diagnosis not present

## 2011-08-11 DIAGNOSIS — M169 Osteoarthritis of hip, unspecified: Secondary | ICD-10-CM | POA: Diagnosis not present

## 2011-08-11 NOTE — H&P (Signed)
Chief Complaint:  right hip pain.  Subjective: Patient is admitted for right total hip arthroplasty.  Patient is a 78 y.o. male who has been seen by Dr. Shelle Iron for ongoing hip pain.  They have been followed and found to have continued progressive pain.  They have been treated conservatively in the past including medications.  Despite conservative measures, they continue to have pain.  X-rays show that the patient has DJD.  It is felt that they would benefit from undergoing surgical intervention.  Risks and benefits have been discussed with the patient and they elect to proceed with surgery.  The patient has no contraindications to the upcoming procedure such as ongoing infection or progressive neurological disease.  Patient did obtain clearance from Dr Felicity Coyer, Dr. Verdis Prime, and Dr Maple Hudson.  Allergies: Allergies  Allergen Reactions  . Actos (Pioglitazone Hydrochloride) Other (See Comments)    "felt funny, drowsy, and weak":  . Celebrex (Celecoxib) Other (See Comments)    "felt funny"  . Demerol Other (See Comments)    Increased BP  . Morphine And Related Nausea And Vomiting  . Ciprofloxacin     REACTION: Arthralgia  . Metformin      Medications: Norvasc ( Oral) Specific dose unknown - Active. Niacin CR ( Oral) Specific dose unknown - Active. Ecotrin Low Strength ( Oral) Specific dose unknown - Active. Bumetanide ( Oral) Specific dose unknown - Active. Diovan ( Oral) Specific dose unknown - Active. Actos ( Oral) Specific dose unknown - Active. Pravastatin Sodium ( Oral) Specific dose unknown - Active. Plavix ( Oral) Specific dose unknown - Active. NexIUM ( Oral) Specific dose unknown - Active. Proventil HFA Flonase Norco Imdur Nitrostat   Past Medical History: Past Medical History  Diagnosis Date  . LACTOSE INTOLERANCE   . OBESITY   . CORONARY ARTERY DISEASE     CABG 1995, PTCA/DES 2008, 2009 and 08/2010  . CARDIOMYOPATHY, ISCHEMIC   . AORTIC SCLEROSIS   . SICK SINUS/  TACHY-BRADY SYNDROME 09/2007    s/p PPM st judes  . PERIPHERAL VASCULAR DISEASE   . CAROTID BRUIT, RIGHT 02/27/2008  . Asthma   . IBS (irritable bowel syndrome)   . ALLERGIC RHINITIS   . ANEMIA-NOS   . OA (osteoarthritis)   . Prostate cancer   . COPD   . DIABETES MELLITUS-TYPE II   . GERD   . HYPERLIPIDEMIA   . HYPERTENSION   . HIATAL HERNIA   . Diverticulosis      Past Surgical History: Past Surgical History  Procedure Date  . Ptca 2008, 2009, 2012    with DES  . Partial small bowel obstruction 2009  . Bilateral cataracts   . Pacemaker insertion   . Hernia repair   . Lumbar disc surgery 12/2008  . Coronary artery bypass graft      Family History: Family History  Problem Relation Age of Onset  . Colon cancer Neg Hx     Social History: History  Substance Use Topics  . Smoking status: Former Smoker    Quit date: 06/09/1995  . Smokeless tobacco: Never Used   Comment: Retired, lives alone, but has a lady friend  . Alcohol Use: No     Review of Systems A comprehensive review of systems was negative except for: Ears, nose, mouth, throat, and face: positive for hearing loss Cardiovascular: positive for dyspnea Gastrointestinal: positive for nausea  Physical Exam:  Vitals: Pulse:72 Respirations:12 Blood Pressure:154/60 General Appearance:    Alert, cooperative, moderate distress, appears stated  age, anxious/irritated  Head:    Normocephalic, without obvious abnormality, atraumatic  Eyes:    PERRL, conjunctiva/corneas clear, EOM's intact, fundi    benign, both eyes       Ears:    Normal TM's and external ear canals, both ears  Nose:   Nares normal, septum midline, mucosa normal, no drainage    or sinus tenderness     Neck:   Supple, symmetrical, trachea midline, no adenopathy;       thyroid:  No enlargement/tenderness/nodules; no carotid   bruit or JVD     Lungs:     Clear to auscultation bilaterally, respirations unlabored  Chest wall:    No tenderness or  deformity  Heart:    Regular rate and rhythm, S1 and S2 normal, no murmur, rub   or gallop  Abdomen:     Protuberant, Soft, non-tender, bowel sounds active all four quadrants,   no masses, no organomegaly        Extremities:   Pain with ROM right hip, compartments soft, decreased ROM  Pulses:   2+ and symmetric all extremities  Skin:   Skin color, texture, turgor normal, no rashes or lesions  Lymph nodes:   Cervical, supraclavicular, and axillary nodes normal  Neurologic:   CNII-XII intact. Normal strength, sensation and reflexes      throughout    Assessment/Plan: End stage arthritis, right hip  The patient is being admitted to [facility] to undergo a right total hip arthroplasty.  Surgery will be performed by Dr. Jene Every.  Risks and benefits have been discussed with the patient and they elect to proceed wth the procedure.

## 2011-08-12 ENCOUNTER — Encounter (HOSPITAL_COMMUNITY): Payer: Self-pay | Admitting: Pharmacy Technician

## 2011-08-13 ENCOUNTER — Ambulatory Visit (HOSPITAL_COMMUNITY)
Admission: RE | Admit: 2011-08-13 | Discharge: 2011-08-13 | Disposition: A | Discharge status: Home / Self Care | Payer: Medicare Other | Source: Ambulatory Visit | Attending: Otolaryngology | Admitting: Otolaryngology

## 2011-08-13 ENCOUNTER — Encounter (HOSPITAL_COMMUNITY)
Admission: RE | Admit: 2011-08-13 | Discharge: 2011-08-13 | Disposition: A | Discharge status: Home / Self Care | Payer: Medicare Other | Source: Ambulatory Visit | Attending: Specialist | Admitting: Specialist

## 2011-08-13 ENCOUNTER — Encounter (HOSPITAL_COMMUNITY): Payer: Self-pay

## 2011-08-13 DIAGNOSIS — M47817 Spondylosis without myelopathy or radiculopathy, lumbosacral region: Secondary | ICD-10-CM | POA: Diagnosis not present

## 2011-08-13 DIAGNOSIS — Z01812 Encounter for preprocedural laboratory examination: Secondary | ICD-10-CM | POA: Diagnosis not present

## 2011-08-13 DIAGNOSIS — M169 Osteoarthritis of hip, unspecified: Secondary | ICD-10-CM | POA: Insufficient documentation

## 2011-08-13 DIAGNOSIS — M6289 Other specified disorders of muscle: Secondary | ICD-10-CM | POA: Diagnosis not present

## 2011-08-13 DIAGNOSIS — M1610 Unilateral primary osteoarthritis, unspecified hip: Secondary | ICD-10-CM | POA: Insufficient documentation

## 2011-08-13 HISTORY — DX: Sleep apnea, unspecified: G47.30

## 2011-08-13 LAB — COMPREHENSIVE METABOLIC PANEL
Alkaline Phosphatase: 83 U/L (ref 39–117)
BUN: 12 mg/dL (ref 6–23)
CO2: 23 mEq/L (ref 19–32)
Chloride: 104 mEq/L (ref 96–112)
Creatinine, Ser: 0.89 mg/dL (ref 0.50–1.35)
GFR calc non Af Amer: 77 mL/min — ABNORMAL LOW (ref >90)
Glucose, Bld: 117 mg/dL — ABNORMAL HIGH (ref 70–99)
Potassium: 4.2 mEq/L (ref 3.5–5.1)
Total Bilirubin: 0.4 mg/dL (ref 0.3–1.2)

## 2011-08-13 LAB — DIFFERENTIAL
Basophils Absolute: 0 10*3/uL (ref 0.0–0.1)
Basophils Relative: 0 % (ref 0–1)
Eosinophils Absolute: 0.2 10*3/uL (ref 0.0–0.7)
Monocytes Absolute: 0.4 10*3/uL (ref 0.1–1.0)
Monocytes Relative: 10 % (ref 3–12)
Neutro Abs: 2.7 10*3/uL (ref 1.7–7.7)
Neutrophils Relative %: 69 % (ref 43–77)

## 2011-08-13 LAB — CBC
HCT: 37 % — ABNORMAL LOW (ref 39.0–52.0)
Hemoglobin: 12.3 g/dL — ABNORMAL LOW (ref 13.0–17.0)
MCV: 95.1 fL (ref 78.0–100.0)
RBC: 3.89 MIL/uL — ABNORMAL LOW (ref 4.22–5.81)
WBC: 3.9 10*3/uL — ABNORMAL LOW (ref 4.0–10.5)

## 2011-08-13 LAB — URINALYSIS, ROUTINE W REFLEX MICROSCOPIC
Bilirubin Urine: NEGATIVE
Glucose, UA: NEGATIVE mg/dL
Ketones, ur: NEGATIVE mg/dL
Leukocytes, UA: NEGATIVE
Protein, ur: NEGATIVE mg/dL
pH: 7 (ref 5.0–8.0)

## 2011-08-13 LAB — SURGICAL PCR SCREEN: Staphylococcus aureus: NEGATIVE

## 2011-08-13 LAB — PROTIME-INR: INR: 1 (ref 0.00–1.49)

## 2011-08-13 MED ORDER — CHLORHEXIDINE GLUCONATE 4 % EX LIQD: 60.0000 mL | Freq: Once | CUTANEOUS | Status: DC

## 2011-08-13 NOTE — Patient Instructions (Addendum)
20 TACARI REPASS  08/13/2011   Your procedure is scheduled on:  08/21/11  Surgery [id number]   Friday  Report to [facility] 1030AM.  Call this number if you have problems the morning of surgery: [phone]     Or PST   [id number]  Jenna Ardoin              BRING CPAP MASK WITH YOU TO HOSPITAL WITH INHALERS  Remember:   Do not eat food:After Midnight.THURSDAY NIGHT  May have clear liquids: bedtime  Thursday      HAVEA SNACK BEFORE BED THURS NIGHT  Clear liquids include soda, tea, black coffee, apple or grape juice, broth.  Take these medicines the morning of surgery with A SIP OF WATER:  NORVASC, NEXIUM, ISORBIDE with sip water   NORCO,or  Nitro or inhalers if needed   Do not wear jewelry, make-up or nail polish.  Do not wear lotions, powders, or perfumes. You may wear deodorant.  Do not shave 48 hours prior to surgery.  Do not bring valuables to the hospital.  Contacts, dentures or bridgework may not be worn into surgery.  Leave suitcase in the car. After surgery it may be brought to your room.  For patients admitted to the hospital, checkout time is 11:00 AM the day of discharge.   Patients discharged the day of surgery will not be allowed to drive home.  Name and phone number of your driver:  [facility]                                                                    Special Instructions: CHG Shower Use Special Wash: 1/2 bottle night before surgery and 1/2 bottle morning of surgery. REGULAR SOAP FACE AND PRIVATES                           MEN-MAY SHAVE FACE MORNING OF SURGERY  Please read over the following fact sheets that you were given: MRSA Information

## 2011-08-13 NOTE — Pre-Procedure Instructions (Signed)
Clearance note with LOV PCP Dr Felicity Coyer on chart with note- needs clearance Dr Katrinka Blazing and Maple Hudson

## 2011-08-14 ENCOUNTER — Encounter (HOSPITAL_COMMUNITY): Payer: Self-pay

## 2011-08-14 ENCOUNTER — Ambulatory Visit (INDEPENDENT_AMBULATORY_CARE_PROVIDER_SITE_OTHER): Payer: Medicare Other | Admitting: Internal Medicine

## 2011-08-14 ENCOUNTER — Encounter: Payer: Self-pay | Admitting: Internal Medicine

## 2011-08-14 VITALS — BP 140/68 | HR 64 | Ht 68.0 in | Wt 243.2 lb

## 2011-08-14 DIAGNOSIS — G4733 Obstructive sleep apnea (adult) (pediatric): Secondary | ICD-10-CM | POA: Diagnosis not present

## 2011-08-14 DIAGNOSIS — J449 Chronic obstructive pulmonary disease, unspecified: Secondary | ICD-10-CM

## 2011-08-14 NOTE — Progress Notes (Signed)
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 He is coming to reestablish after moving back from [place] to [place]. History of asthma, COPD, obstructive sleep apnea complicated by DM, obesity, CAD/ischemic CM/sick sinus/bradycardia tachycardia with pacemaker, history of prostate cancer, anemia. He reports feeling stable today. New carpet smell in his apartment is irritating to his breathing but he denies routine cough or recent bronchitis. He has not felt the need to use his bronchodilators in 4 months. Walking distances limited more by degenerative joint hip pain. He can walk a maximum of 10 minutes on level ground and very little on hills or stairs. He denies blood, adenopathy, discolored sputum or swelling. He usually has trace ankle edema with no history of venous thromboembolic disease. CAD treated with CABG, stent and pacemaker but he has not had infarction. Did have pneumonia at age 53. He declines flu vaccine. Has had pneumonia vaccine. Long-term diagnosis of sleep apnea. NPSG 01/27/06-AHI 59.9 per hour. CPAP 12 plus oxygen 2 L for sleep, with good compliance and control. He will want to change to a local provider.  07/14/11- 78 year old male former smoker, Veteran,  History of asthma, COPD, obstructive sleep apnea complicated by DM, obesity, CAD/ischemic CM/sick sinus/bradycardia tachycardia with pacemaker, history of prostate cancer, anemia He had his first flu vaccine in 10 years this winter and avoided any significant respiratory infection. He no longer thinks he needs daytime oxygen. He is wearing it only at night, with his CPAP. Scheduled for right hip replacement in March. Chest x-ray 04/15/2011-no acute process, pacemaker PFT 04/23/2011-mild COPD  08/14/11-  78 year old male former smoker, Veteran,  History of asthma, COPD, obstructive sleep apnea complicated by DM, obesity, CAD/ischemic CM/sick sinus/bradycardia tachycardia/ pacemaker,  history of prostate cancer, anemia He is pending hip replacement by Dr. Shelle Iron on March 15. Asks prior authorization. I explained his increased risk of cardiopulmonary complications from any surgery and recognition that he is probably stable and near his best function now for necessary surgery. He is trying to move from his current apartment to a smoke free facility. A neighbor smokes heavily and the tobacco odor comes into the air conditioning ducts. He continues good compliance and symptom control using CPAP 12 with O2 2 L every night. He will use those settings while he is at the hospital for sleep and in recovery after extubation.  ROS-see HPI Constitutional:   No-   weight loss, night sweats, fevers, chills, fatigue, lassitude. HEENT:   No-  headaches, difficulty swallowing, tooth/dental problems, sore throat,       No-  sneezing, itching, ear ache, nasal congestion, post nasal drip,  CV:  No- recent  chest pain, orthopnea, PND. Mild chronic swelling in lower extremities,No- anasarca, dizziness, palpitations Resp: No- acute  shortness of breath with exertion or at rest.              No-   productive cough,  No non-productive cough,  No- coughing up of blood.              No-   change in color of mucus.  No- wheezing.   Skin: No-   rash or lesions. GI:  No-   heartburn, indigestion, abdominal pain, nausea, vomiting, diarrhea,                 change in bowel habits, loss of appetite GU:  MS: . Limiting hip pain Neuro-     nothing unusual Psych:  No-  change in mood or affect. No depression or anxiety.  No memory loss.  OBJ General- Alert, Oriented, Affect-appropriate, Distress- none acute, overweight, Sat 92% on room air Skin- rash-none, lesions- none, excoriation- none Lymphadenopathy- none Head- atraumatic            Eyes- Gross vision intact, PERRLA, conjunctivae clear secretions            Ears- Hearing aids, HOH            Nose- Clear, no-Septal dev, mucus, polyps, erosion,  perforation             Throat- Mallampati II , mucosa clear , drainage- none, tonsils- atrophic Neck- flexible , trachea midline, no stridor , thyroid nl, carotid no bruit Chest - symmetrical excursion , unlabored           Heart/CV- RRR , no murmur , no gallop  , no rub, nl s1 s2                           - JVD- none , edema- none, stasis changes- none, varices- none           Lung- basilar crackles, unlabored, wheeze- none, cough- none , dullness-none, rub- none           Chest wall-  Abd- Br/ Gen/ Rectal- Not done, not indicated Extrem- cyanosis- none, clubbing, none, atrophy- none, strength- deconditioned, cane Neuro- grossly intact to observation

## 2011-08-14 NOTE — Patient Instructions (Addendum)
Your pulmonary status is stable for planned hip surgery next week.  Take your CPAP mask to the hospital when you go. You will be using the hospital's machine, managed by the Respiratory Therapists.   Your CPAP pressure is 12, used with Oxygen at 2 liters per minute every night for sleep.

## 2011-08-17 ENCOUNTER — Encounter: Payer: Self-pay | Admitting: Internal Medicine

## 2011-08-17 NOTE — Assessment & Plan Note (Signed)
He'll take his CPAP mask to the hospital and use hospital equipment from respiratory therapy to provide CPAP at 12 CWP and oxygen at 2 L per minute or as needed to keep oxygen saturation between 90 and 96%, while asleep and in recovery after extubation.

## 2011-08-17 NOTE — Assessment & Plan Note (Signed)
Mild obstructive airways disease with some response to bronchodilator. By itself this doesn't represent a significant barrier to anesthesia or planned surgery.

## 2011-08-21 ENCOUNTER — Ambulatory Visit: Payer: Medicare Other | Admitting: Internal Medicine

## 2011-08-21 ENCOUNTER — Encounter (HOSPITAL_COMMUNITY): Payer: Self-pay | Admitting: *Deleted

## 2011-08-21 ENCOUNTER — Encounter (HOSPITAL_COMMUNITY): Payer: Self-pay

## 2011-08-21 ENCOUNTER — Inpatient Hospital Stay (HOSPITAL_COMMUNITY): Payer: Medicare Other

## 2011-08-21 ENCOUNTER — Inpatient Hospital Stay (HOSPITAL_COMMUNITY)
Admission: RE | Admit: 2011-08-21 | Discharge: 2011-08-25 | DRG: 470 | Disposition: A | Discharge status: Skilled Nursing Facility | Payer: Medicare Other | Source: Ambulatory Visit | Attending: Specialist | Admitting: Specialist

## 2011-08-21 ENCOUNTER — Encounter (HOSPITAL_COMMUNITY)
Admission: RE | Disposition: A | Discharge status: Skilled Nursing Facility | Payer: Self-pay | Source: Ambulatory Visit | Attending: Specialist

## 2011-08-21 ENCOUNTER — Inpatient Hospital Stay (HOSPITAL_COMMUNITY): Payer: Medicare Other | Admitting: *Deleted

## 2011-08-21 DIAGNOSIS — I2510 Atherosclerotic heart disease of native coronary artery without angina pectoris: Secondary | ICD-10-CM | POA: Diagnosis present

## 2011-08-21 DIAGNOSIS — D62 Acute posthemorrhagic anemia: Secondary | ICD-10-CM | POA: Diagnosis not present

## 2011-08-21 DIAGNOSIS — Z4801 Encounter for change or removal of surgical wound dressing: Secondary | ICD-10-CM | POA: Diagnosis not present

## 2011-08-21 DIAGNOSIS — Z471 Aftercare following joint replacement surgery: Secondary | ICD-10-CM | POA: Diagnosis not present

## 2011-08-21 DIAGNOSIS — I2589 Other forms of chronic ischemic heart disease: Secondary | ICD-10-CM | POA: Diagnosis not present

## 2011-08-21 DIAGNOSIS — Z8546 Personal history of malignant neoplasm of prostate: Secondary | ICD-10-CM

## 2011-08-21 DIAGNOSIS — Z951 Presence of aortocoronary bypass graft: Secondary | ICD-10-CM

## 2011-08-21 DIAGNOSIS — I4891 Unspecified atrial fibrillation: Secondary | ICD-10-CM | POA: Diagnosis present

## 2011-08-21 DIAGNOSIS — I10 Essential (primary) hypertension: Secondary | ICD-10-CM | POA: Diagnosis present

## 2011-08-21 DIAGNOSIS — M169 Osteoarthritis of hip, unspecified: Secondary | ICD-10-CM | POA: Diagnosis present

## 2011-08-21 DIAGNOSIS — J4489 Other specified chronic obstructive pulmonary disease: Secondary | ICD-10-CM | POA: Diagnosis present

## 2011-08-21 DIAGNOSIS — E871 Hypo-osmolality and hyponatremia: Secondary | ICD-10-CM | POA: Diagnosis not present

## 2011-08-21 DIAGNOSIS — E669 Obesity, unspecified: Secondary | ICD-10-CM | POA: Diagnosis not present

## 2011-08-21 DIAGNOSIS — Z5189 Encounter for other specified aftercare: Secondary | ICD-10-CM | POA: Diagnosis not present

## 2011-08-21 DIAGNOSIS — Z7901 Long term (current) use of anticoagulants: Secondary | ICD-10-CM | POA: Diagnosis not present

## 2011-08-21 DIAGNOSIS — E119 Type 2 diabetes mellitus without complications: Secondary | ICD-10-CM | POA: Diagnosis present

## 2011-08-21 DIAGNOSIS — M1610 Unilateral primary osteoarthritis, unspecified hip: Principal | ICD-10-CM | POA: Diagnosis present

## 2011-08-21 DIAGNOSIS — R262 Difficulty in walking, not elsewhere classified: Secondary | ICD-10-CM | POA: Diagnosis not present

## 2011-08-21 DIAGNOSIS — J449 Chronic obstructive pulmonary disease, unspecified: Secondary | ICD-10-CM | POA: Diagnosis present

## 2011-08-21 DIAGNOSIS — M6281 Muscle weakness (generalized): Secondary | ICD-10-CM | POA: Diagnosis not present

## 2011-08-21 DIAGNOSIS — M1990 Unspecified osteoarthritis, unspecified site: Secondary | ICD-10-CM | POA: Diagnosis not present

## 2011-08-21 DIAGNOSIS — M25559 Pain in unspecified hip: Secondary | ICD-10-CM | POA: Diagnosis not present

## 2011-08-21 DIAGNOSIS — Z96649 Presence of unspecified artificial hip joint: Secondary | ICD-10-CM | POA: Diagnosis not present

## 2011-08-21 HISTORY — PX: TOTAL HIP ARTHROPLASTY: SHX124

## 2011-08-21 LAB — GLUCOSE, CAPILLARY
Glucose-Capillary: 108 mg/dL — ABNORMAL HIGH (ref 70–99)
Glucose-Capillary: 137 mg/dL — ABNORMAL HIGH (ref 70–99)

## 2011-08-21 LAB — TYPE AND SCREEN: ABO/RH(D): A POS

## 2011-08-21 SURGERY — ARTHROPLASTY, HIP, TOTAL,POSTERIOR APPROACH
Anesthesia: General | Site: Hip | Laterality: Right | Wound class: Clean

## 2011-08-21 MED ORDER — ISOSORBIDE MONONITRATE ER 60 MG PO TB24
60.0000 mg | ORAL_TABLET | Freq: Every day | ORAL | Status: DC
  Administered 2011-08-21 – 2011-08-25 (×5): 60 mg via ORAL
  Filled 2011-08-21 (×5): qty 1

## 2011-08-21 MED ORDER — ALUMINUM & MAGNESIUM HYDROXIDE 225-200 MG/5ML PO SUSP
15.0000 mL | Freq: Four times a day (QID) | ORAL | Status: DC | PRN
  Filled 2011-08-21: qty 355

## 2011-08-21 MED ORDER — INSULIN ASPART 100 UNIT/ML ~~LOC~~ SOLN
0.0000 [IU] | Freq: Three times a day (TID) | SUBCUTANEOUS | Status: DC
  Administered 2011-08-22 (×2): 2 [IU] via SUBCUTANEOUS
  Administered 2011-08-22: 3 [IU] via SUBCUTANEOUS
  Administered 2011-08-23 – 2011-08-25 (×6): 2 [IU] via SUBCUTANEOUS

## 2011-08-21 MED ORDER — HYDROCODONE-ACETAMINOPHEN 7.5-325 MG PO TABS
1.0000 | ORAL_TABLET | ORAL | Status: DC | PRN
  Administered 2011-08-21 – 2011-08-22 (×2): 1 via ORAL
  Administered 2011-08-22: 2 via ORAL
  Administered 2011-08-23: 1 via ORAL
  Administered 2011-08-23 – 2011-08-24 (×6): 2 via ORAL
  Filled 2011-08-21 (×5): qty 2
  Filled 2011-08-21: qty 1
  Filled 2011-08-21: qty 2
  Filled 2011-08-21: qty 1
  Filled 2011-08-21: qty 2
  Filled 2011-08-21: qty 1
  Filled 2011-08-21: qty 2

## 2011-08-21 MED ORDER — ENOXAPARIN SODIUM 40 MG/0.4ML ~~LOC~~ SOLN
40.0000 mg | SUBCUTANEOUS | Status: DC
  Administered 2011-08-22 – 2011-08-25 (×4): 40 mg via SUBCUTANEOUS
  Filled 2011-08-21 (×4): qty 0.4

## 2011-08-21 MED ORDER — ONDANSETRON HCL 4 MG/2ML IJ SOLN: 4.0000 mg | Freq: Four times a day (QID) | INTRAMUSCULAR | Status: DC | PRN

## 2011-08-21 MED ORDER — MENTHOL 3 MG MT LOZG
1.0000 | LOZENGE | OROMUCOSAL | Status: DC | PRN
  Filled 2011-08-21: qty 9

## 2011-08-21 MED ORDER — PANTOPRAZOLE SODIUM 40 MG PO TBEC
40.0000 mg | DELAYED_RELEASE_TABLET | Freq: Every day | ORAL | Status: DC
  Administered 2011-08-21 – 2011-08-23 (×3): 40 mg via ORAL
  Filled 2011-08-21 (×3): qty 1

## 2011-08-21 MED ORDER — BISACODYL 5 MG PO TBEC: 5.0000 mg | DELAYED_RELEASE_TABLET | Freq: Every day | ORAL | Status: DC | PRN

## 2011-08-21 MED ORDER — ALUM & MAG HYDROXIDE-SIMETH 200-200-20 MG/5ML PO SUSP
15.0000 mL | Freq: Four times a day (QID) | ORAL | Status: DC | PRN
  Administered 2011-08-22: 15 mL via ORAL
  Filled 2011-08-21: qty 30

## 2011-08-21 MED ORDER — ROCURONIUM BROMIDE 100 MG/10ML IV SOLN
INTRAVENOUS | Status: DC | PRN
  Administered 2011-08-21: 50 mg via INTRAVENOUS
  Administered 2011-08-21: 10 mg via INTRAVENOUS

## 2011-08-21 MED ORDER — POLYETHYLENE GLYCOL 3350 17 G PO PACK: 17.0000 g | PACK | Freq: Every day | ORAL | Status: DC | PRN

## 2011-08-21 MED ORDER — GLYCOPYRROLATE 0.2 MG/ML IJ SOLN
INTRAMUSCULAR | Status: DC | PRN
  Administered 2011-08-21: .6 mg via INTRAVENOUS

## 2011-08-21 MED ORDER — SODIUM CHLORIDE 0.9 % IR SOLN
Status: DC | PRN
  Administered 2011-08-21 (×2)

## 2011-08-21 MED ORDER — POLYVINYL ALCOHOL 1.4 % OP SOLN
2.0000 [drp] | Freq: Every day | OPHTHALMIC | Status: DC
  Administered 2011-08-21 – 2011-08-24 (×4): 2 [drp] via OPHTHALMIC
  Filled 2011-08-21: qty 15

## 2011-08-21 MED ORDER — METOCLOPRAMIDE HCL 5 MG/ML IJ SOLN: 5.0000 mg | Freq: Three times a day (TID) | INTRAMUSCULAR | Status: DC | PRN

## 2011-08-21 MED ORDER — METOCLOPRAMIDE HCL 5 MG/ML IJ SOLN
INTRAMUSCULAR | Status: DC | PRN
  Administered 2011-08-21: 10 mg via INTRAVENOUS

## 2011-08-21 MED ORDER — ACETAMINOPHEN 10 MG/ML IV SOLN
INTRAVENOUS | Status: DC | PRN
  Administered 2011-08-21: 1000 mg via INTRAVENOUS

## 2011-08-21 MED ORDER — ACETAMINOPHEN 650 MG RE SUPP: 650.0000 mg | Freq: Four times a day (QID) | RECTAL | Status: DC | PRN

## 2011-08-21 MED ORDER — DOCUSATE SODIUM 100 MG PO CAPS
100.0000 mg | ORAL_CAPSULE | Freq: Two times a day (BID) | ORAL | Status: DC
  Administered 2011-08-22 – 2011-08-25 (×7): 100 mg via ORAL
  Filled 2011-08-21 (×7): qty 1

## 2011-08-21 MED ORDER — IPRATROPIUM-ALBUTEROL 18-103 MCG/ACT IN AERO
2.0000 | INHALATION_SPRAY | Freq: Four times a day (QID) | RESPIRATORY_TRACT | Status: DC | PRN
  Administered 2011-08-25: 2 via RESPIRATORY_TRACT
  Filled 2011-08-21: qty 14.7

## 2011-08-21 MED ORDER — EPHEDRINE SULFATE 50 MG/ML IJ SOLN
INTRAMUSCULAR | Status: DC | PRN
  Administered 2011-08-21 (×4): 5 mg via INTRAVENOUS

## 2011-08-21 MED ORDER — FLUTICASONE PROPIONATE 50 MCG/ACT NA SUSP
2.0000 | Freq: Every day | NASAL | Status: DC | PRN
  Filled 2011-08-21: qty 16

## 2011-08-21 MED ORDER — POTASSIUM CHLORIDE IN NACL 20-0.45 MEQ/L-% IV SOLN
INTRAVENOUS | Status: DC
  Administered 2011-08-21: 100 mL/h via INTRAVENOUS
  Administered 2011-08-22: 05:00:00 via INTRAVENOUS
  Filled 2011-08-21 (×3): qty 1000

## 2011-08-21 MED ORDER — CEFAZOLIN SODIUM-DEXTROSE 2-3 GM-% IV SOLR
2.0000 g | Freq: Four times a day (QID) | INTRAVENOUS | Status: AC
  Administered 2011-08-21 – 2011-08-22 (×3): 2 g via INTRAVENOUS
  Filled 2011-08-21 (×3): qty 50

## 2011-08-21 MED ORDER — NITROGLYCERIN 0.4 MG SL SUBL: 0.4000 mg | SUBLINGUAL_TABLET | SUBLINGUAL | Status: DC | PRN

## 2011-08-21 MED ORDER — CEFAZOLIN SODIUM-DEXTROSE 2-3 GM-% IV SOLR: 2.0000 g | INTRAVENOUS | Status: DC

## 2011-08-21 MED ORDER — INSULIN ASPART 100 UNIT/ML ~~LOC~~ SOLN: 0.0000 [IU] | SUBCUTANEOUS | Status: DC

## 2011-08-21 MED ORDER — PHENOL 1.4 % MT LIQD: 1.0000 | OROMUCOSAL | Status: DC | PRN

## 2011-08-21 MED ORDER — NIACIN ER 500 MG PO TBCR
1000.0000 mg | EXTENDED_RELEASE_TABLET | Freq: Every day | ORAL | Status: DC
  Administered 2011-08-21 – 2011-08-24 (×4): 1000 mg via ORAL
  Filled 2011-08-21 (×5): qty 2

## 2011-08-21 MED ORDER — POLYETHYL GLYCOL-PROPYL GLYCOL 0.4-0.3 % OP SOLN: 2.0000 [drp] | Freq: Every day | OPHTHALMIC | Status: DC

## 2011-08-21 MED ORDER — METOCLOPRAMIDE HCL 10 MG PO TABS
5.0000 mg | ORAL_TABLET | Freq: Three times a day (TID) | ORAL | Status: DC | PRN
  Administered 2011-08-25: 10 mg via ORAL
  Filled 2011-08-21: qty 1

## 2011-08-21 MED ORDER — ONDANSETRON HCL 4 MG PO TABS: 4.0000 mg | ORAL_TABLET | Freq: Four times a day (QID) | ORAL | Status: DC | PRN

## 2011-08-21 MED ORDER — HYDROMORPHONE HCL PF 1 MG/ML IJ SOLN
0.5000 mg | INTRAMUSCULAR | Status: DC | PRN
  Administered 2011-08-21 – 2011-08-22 (×2): 0.5 mg via INTRAVENOUS
  Administered 2011-08-22: 1 mg via INTRAVENOUS
  Filled 2011-08-21 (×3): qty 1

## 2011-08-21 MED ORDER — LIDOCAINE HCL (CARDIAC) 20 MG/ML IV SOLN
INTRAVENOUS | Status: DC | PRN
  Administered 2011-08-21: 50 mg via INTRAVENOUS

## 2011-08-21 MED ORDER — HYDROMORPHONE HCL PF 1 MG/ML IJ SOLN
0.5000 mg | INTRAMUSCULAR | Status: DC | PRN
  Administered 2011-08-21 (×3): 0.5 mg via INTRAVENOUS

## 2011-08-21 MED ORDER — IRBESARTAN 150 MG PO TABS
150.0000 mg | ORAL_TABLET | Freq: Every day | ORAL | Status: DC
  Administered 2011-08-21 – 2011-08-25 (×5): 150 mg via ORAL
  Filled 2011-08-21 (×5): qty 1

## 2011-08-21 MED ORDER — ALBUTEROL SULFATE HFA 108 (90 BASE) MCG/ACT IN AERS
2.0000 | INHALATION_SPRAY | RESPIRATORY_TRACT | Status: DC | PRN
  Filled 2011-08-21: qty 6.7

## 2011-08-21 MED ORDER — LACTATED RINGERS IV SOLN
INTRAVENOUS | Status: DC
  Administered 2011-08-21: 14:00:00 via INTRAVENOUS
  Administered 2011-08-21: 1000 mL via INTRAVENOUS

## 2011-08-21 MED ORDER — ONDANSETRON HCL 4 MG/2ML IJ SOLN
INTRAMUSCULAR | Status: DC | PRN
  Administered 2011-08-21: 4 mg via INTRAVENOUS

## 2011-08-21 MED ORDER — FAMOTIDINE IN NACL 20-0.9 MG/50ML-% IV SOLN
20.0000 mg | Freq: Once | INTRAVENOUS | Status: AC
  Administered 2011-08-21: 10 mg via INTRAVENOUS
  Filled 2011-08-21: qty 50

## 2011-08-21 MED ORDER — ALBUTEROL SULFATE (5 MG/ML) 0.5% IN NEBU
2.5000 mg | INHALATION_SOLUTION | RESPIRATORY_TRACT | Status: DC | PRN
  Filled 2011-08-21: qty 0.5

## 2011-08-21 MED ORDER — AMLODIPINE BESYLATE 2.5 MG PO TABS
2.5000 mg | ORAL_TABLET | Freq: Every day | ORAL | Status: DC
  Administered 2011-08-22 – 2011-08-24 (×2): 2.5 mg via ORAL
  Filled 2011-08-21 (×4): qty 1

## 2011-08-21 MED ORDER — NEOSTIGMINE METHYLSULFATE 1 MG/ML IJ SOLN
INTRAMUSCULAR | Status: DC | PRN
  Administered 2011-08-21: 5 mg via INTRAVENOUS

## 2011-08-21 MED ORDER — ACETAMINOPHEN 325 MG PO TABS: 650.0000 mg | ORAL_TABLET | Freq: Four times a day (QID) | ORAL | Status: DC | PRN

## 2011-08-21 MED ORDER — FENTANYL CITRATE 0.05 MG/ML IJ SOLN
25.0000 ug | INTRAMUSCULAR | Status: DC | PRN
  Administered 2011-08-21 (×3): 50 ug via INTRAVENOUS

## 2011-08-21 MED ORDER — DIPHENHYDRAMINE HCL 12.5 MG/5ML PO ELIX: 12.5000 mg | ORAL_SOLUTION | ORAL | Status: DC | PRN

## 2011-08-21 MED ORDER — FENTANYL CITRATE 0.05 MG/ML IJ SOLN
INTRAMUSCULAR | Status: DC | PRN
  Administered 2011-08-21: 100 ug via INTRAVENOUS
  Administered 2011-08-21: 50 ug via INTRAVENOUS
  Administered 2011-08-21: 100 ug via INTRAVENOUS

## 2011-08-21 MED ORDER — CEFAZOLIN SODIUM 1-5 GM-% IV SOLN
INTRAVENOUS | Status: DC | PRN
  Administered 2011-08-21: 2 g via INTRAVENOUS

## 2011-08-21 MED ORDER — PROPOFOL 10 MG/ML IV BOLUS
INTRAVENOUS | Status: DC | PRN
  Administered 2011-08-21: 140 mg via INTRAVENOUS

## 2011-08-21 MED ORDER — SIMVASTATIN 10 MG PO TABS
10.0000 mg | ORAL_TABLET | Freq: Every day | ORAL | Status: DC
  Administered 2011-08-21: 10 mg via ORAL
  Filled 2011-08-21 (×3): qty 1

## 2011-08-21 MED ORDER — DEXAMETHASONE SODIUM PHOSPHATE 10 MG/ML IJ SOLN
INTRAMUSCULAR | Status: DC | PRN
  Administered 2011-08-21: 10 mg via INTRAVENOUS

## 2011-08-21 SURGICAL SUPPLY — 53 items
BAG ZIPLOCK 12X15 (MISCELLANEOUS) ×2 IMPLANT
BLADE SAW SAG 73X25 THK (BLADE) ×1
BLADE SAW SGTL 18X1.27X75 (BLADE) ×2 IMPLANT
BLADE SAW SGTL 73X25 THK (BLADE) ×1 IMPLANT
CHLORAPREP W/TINT 26ML (MISCELLANEOUS) IMPLANT
CLOTH BEACON ORANGE TIMEOUT ST (SAFETY) ×2 IMPLANT
DRAPE INCISE IOBAN 66X45 STRL (DRAPES) ×2 IMPLANT
DRAPE INCISE IOBAN 85X60 (DRAPES) ×2 IMPLANT
DRAPE ORTHO SPLIT 77X108 STRL (DRAPES) ×2
DRAPE POUCH INSTRU U-SHP 10X18 (DRAPES) ×2 IMPLANT
DRAPE SURG 17X11 SM STRL (DRAPES) ×2 IMPLANT
DRAPE SURG ORHT 6 SPLT 77X108 (DRAPES) ×2 IMPLANT
DRAPE U-SHAPE 47X51 STRL (DRAPES) ×2 IMPLANT
DRSG ADAPTIC 3X8 NADH LF (GAUZE/BANDAGES/DRESSINGS) ×2 IMPLANT
DRSG EMULSION OIL 3X16 NADH (GAUZE/BANDAGES/DRESSINGS) ×2 IMPLANT
DRSG MEPILEX BORDER 4X8 (GAUZE/BANDAGES/DRESSINGS) ×2 IMPLANT
DRSG PAD ABDOMINAL 8X10 ST (GAUZE/BANDAGES/DRESSINGS) ×2 IMPLANT
DURAPREP 26ML APPLICATOR (WOUND CARE) ×2 IMPLANT
ELECT BLADE TIP CTD 4 INCH (ELECTRODE) ×2 IMPLANT
ELECT REM PT RETURN 9FT ADLT (ELECTROSURGICAL) ×2
ELECTRODE REM PT RTRN 9FT ADLT (ELECTROSURGICAL) ×1 IMPLANT
EVACUATOR 1/8 PVC DRAIN (DRAIN) ×2 IMPLANT
FACESHIELD LNG OPTICON STERILE (SAFETY) ×10 IMPLANT
GLOVE BIOGEL PI IND STRL 8 (GLOVE) ×1 IMPLANT
GLOVE BIOGEL PI INDICATOR 8 (GLOVE) ×1
GLOVE SURG SS PI 8.0 STRL IVOR (GLOVE) ×4 IMPLANT
GOWN PREVENTION PLUS LG XLONG (DISPOSABLE) ×2 IMPLANT
GOWN PREVENTION PLUS XLARGE (GOWN DISPOSABLE) ×2 IMPLANT
GOWN STRL REIN XL XLG (GOWN DISPOSABLE) ×2 IMPLANT
IMMOBILIZER KNEE 22 UNIV (SOFTGOODS) ×2 IMPLANT
KIT BASIN OR (CUSTOM PROCEDURE TRAY) ×2 IMPLANT
MANIFOLD NEPTUNE II (INSTRUMENTS) ×2 IMPLANT
NS IRRIG 1000ML POUR BTL (IV SOLUTION) ×2 IMPLANT
PACK TOTAL JOINT (CUSTOM PROCEDURE TRAY) ×2 IMPLANT
PASSER SUT SWANSON 36MM LOOP (INSTRUMENTS) ×2 IMPLANT
PILLOW ABDUCTION HIP (SOFTGOODS) ×2 IMPLANT
POSITIONER SURGICAL ARM (MISCELLANEOUS) ×2 IMPLANT
SPONGE GAUZE 4X4 12PLY (GAUZE/BANDAGES/DRESSINGS) ×2 IMPLANT
SPONGE LAP 18X18 X RAY DECT (DISPOSABLE) ×2 IMPLANT
SPONGE LAP 4X18 X RAY DECT (DISPOSABLE) ×2 IMPLANT
STAPLER VISISTAT (STAPLE) ×2 IMPLANT
STEM FEM CMNTLSS LG AML 13.5 (Hips) ×2 IMPLANT
SUT ETHIBOND NAB CT1 #1 30IN (SUTURE) ×8 IMPLANT
SUT VIC AB 0 CT1 27 (SUTURE) ×3
SUT VIC AB 0 CT1 27XBRD ANTBC (SUTURE) ×3 IMPLANT
SUT VIC AB 1 CT1 27 (SUTURE) ×4
SUT VIC AB 1 CT1 27XBRD ANTBC (SUTURE) ×4 IMPLANT
SUT VIC AB 2-0 CT1 27 (SUTURE) ×3
SUT VIC AB 2-0 CT1 TAPERPNT 27 (SUTURE) ×3 IMPLANT
SYR 30ML LL (SYRINGE) ×2 IMPLANT
TAPE CLOTH SURG 4X10 WHT LF (GAUZE/BANDAGES/DRESSINGS) ×2 IMPLANT
TRAY FOLEY CATH 14FRSI W/METER (CATHETERS) IMPLANT
WATER STERILE IRR 1500ML POUR (IV SOLUTION) ×2 IMPLANT

## 2011-08-21 NOTE — Interval H&P Note (Signed)
History and Physical Interval Note:  08/21/2011 1:01 PM  Eric [name]  has presented today for surgery, with the diagnosis of Osteoarthritis of the Right Hip  The various methods of treatment have been discussed with the patient and family. After consideration of risks, benefits and other options for treatment, the patient has consented to  Procedure(s) (LRB): TOTAL HIP ARTHROPLASTY (Right) as a surgical intervention .  The patients' history has been reviewed, patient examined, no change in status, stable for surgery.  I have reviewed the patients' chart and labs.  Questions were answered to the patient's satisfaction.     Eean Buss C

## 2011-08-21 NOTE — Transfer of Care (Signed)
Immediate Anesthesia Transfer of Care Note  Patient: Eric [name]  Procedure(s) Performed: Procedure(s) (LRB): TOTAL HIP ARTHROPLASTY (Right)  Patient Location: PACU  Anesthesia Type: General  Level of Consciousness: sedated  Airway & Oxygen Therapy: Patient Spontanous Breathing and Patient connected to face mask oxygen  Post-op Assessment: Report given to PACU RN and Post -op Vital signs reviewed and stable  Post vital signs: Reviewed and stable  Complications: No apparent anesthesia complications

## 2011-08-21 NOTE — Addendum Note (Signed)
Addendum  created 08/21/11 1547 by Gaetano Hawthorne, MD   Modules edited:Orders

## 2011-08-21 NOTE — Preoperative (Signed)
Beta Blockers   Reason not to administer Beta Blockers:Not Applicable 

## 2011-08-21 NOTE — Progress Notes (Signed)
Rt placed CPAP on Pt at 12 cm H20 and 5LPM of bleed in 02, full face mask. Pt doing well at this time.

## 2011-08-21 NOTE — Addendum Note (Signed)
Addendum  created 08/21/11 1729 by Gaetano Hawthorne, MD   Modules edited:Orders, PRL Based Order Sets

## 2011-08-21 NOTE — Anesthesia Preprocedure Evaluation (Signed)
Anesthesia Evaluation  Patient identified by MRN, date of birth, ID band Patient awake  General Assessment Comment:Advanced years HOH Multiple co-morbidities, increased CV risk  Reviewed: Allergy & Precautions, H&P , NPO status , Patient's Chart, lab work & pertinent test results, reviewed documented beta blocker date and time   Airway  TM Distance: >3 FB Neck ROM: Full    Dental  (+) Upper Dentures and Lower Dentures   Pulmonary sleep apnea and Continuous Positive Airway Pressure Ventilation , COPD COPD inhaler,  breath sounds clear to auscultation  + decreased breath sounds      Cardiovascular hypertension, Pt. on medications + CAD + dysrhythmias Atrial Fibrillation Rhythm:Regular Rate:Normal  CAD, s/p CABG, & 3 stents Given CV clearance Pacemaker for SSS   Neuro/Psych negative neurological ROS  negative psych ROS   GI/Hepatic negative GI ROS, Neg liver ROS,   Endo/Other  Diabetes mellitus-diet controlled obesity  Renal/GU negative Renal ROS  negative genitourinary   Musculoskeletal negative musculoskeletal ROS (+)   Abdominal   Peds negative pediatric ROS (+)  Hematology negative hematology ROS (+)   Anesthesia Other Findings   Reproductive/Obstetrics negative OB ROS                           Anesthesia Physical Anesthesia Plan  ASA: III  Anesthesia Plan: General   Post-op Pain Management:    Induction: Intravenous  Airway Management Planned: Oral ETT  Additional Equipment:   Intra-op Plan:   Post-operative Plan: Extubation in OR  Informed Consent:   Plan Discussed with: CRNA and Surgeon  Anesthesia Plan Comments: (Pt refuses regional)        Anesthesia Quick Evaluation

## 2011-08-21 NOTE — Brief Op Note (Signed)
08/21/2011  2:59 PM  PATIENT:  Eric [name]  78 y.o. male  PRE-OPERATIVE DIAGNOSIS:  Osteoarthritis of the Right Hip  POST-OPERATIVE DIAGNOSIS:  right hip osteoarthritis  PROCEDURE:  Procedure(s) (LRB): TOTAL HIP ARTHROPLASTY (Right)  SURGEON:  Surgeon(s) and Role:    * Javier Docker, MD - Primary    * Shelda Pal, MD - Assisting  PHYSICIAN ASSISTANT:   ASSISTANTS: strader   ANESTHESIA:   general  EBL:  Total I/O In: 1000 [I.V.:1000] Out: 650 [Urine:50; Blood:600]  BLOOD ADMINISTERED:none  DRAINS: none   LOCAL MEDICATIONS USED:  MARCAINE     SPECIMEN:  No Specimen  DISPOSITION OF SPECIMEN:  N/A  COUNTS:  YES  TOURNIQUET:  * No tourniquets in log *  DICTATION: .Other Dictation: Dictation Number [id number]  PLAN OF CARE: Admit to inpatient   PATIENT DISPOSITION:  PACU - hemodynamically stable.   Delay start of Pharmacological VTE agent (>24hrs) due to surgical blood loss or risk of bleeding: yes

## 2011-08-21 NOTE — Anesthesia Procedure Notes (Signed)
Procedure Name: Intubation Date/Time: 08/21/2011 1:18 PM Performed by: Randon Goldsmith CATHERINE PAYNE Pre-anesthesia Checklist: Patient identified, Emergency Drugs available, Suction available and Patient being monitored Patient Re-evaluated:Patient Re-evaluated prior to inductionOxygen Delivery Method: Circle system utilized Preoxygenation: Pre-oxygenation with 100% oxygen Intubation Type: IV induction Ventilation: Mask ventilation without difficulty Laryngoscope Size: Mac and Miller Grade View: Grade I Tube type: Oral Tube size: 7.5 mm Number of attempts: 1 Airway Equipment and Method: Stylet Placement Confirmation: ETT inserted through vocal cords under direct vision,  positive ETCO2 and CO2 detector Secured at: 19 cm Tube secured with: Tape Dental Injury: Teeth and Oropharynx as per pre-operative assessment

## 2011-08-21 NOTE — Anesthesia Postprocedure Evaluation (Signed)
  Anesthesia Post-op Note  Patient: Eric [name]  Procedure(s) Performed: Procedure(s) (LRB): TOTAL HIP ARTHROPLASTY (Right)  Patient Location: PACU  Anesthesia Type: General  Level of Consciousness: awake and alert   Airway and Oxygen Therapy: Patient Spontanous Breathing  Post-op Pain: mild  Post-op Assessment: Post-op Vital signs reviewed, Patient's Cardiovascular Status Stable, Respiratory Function Stable, Patent Airway and No signs of Nausea or vomiting  Post-op Vital Signs: stable  Complications: No apparent anesthesia complications

## 2011-08-22 LAB — BASIC METABOLIC PANEL
BUN: 22 mg/dL (ref 6–23)
Calcium: 10 mg/dL (ref 8.4–10.5)
GFR calc Af Amer: >90 mL/min (ref >90)
GFR calc non Af Amer: 79 mL/min — ABNORMAL LOW (ref >90)
Glucose, Bld: 154 mg/dL — ABNORMAL HIGH (ref 70–99)
Potassium: 5.1 mEq/L (ref 3.5–5.1)
Sodium: 133 mEq/L — ABNORMAL LOW (ref 135–145)

## 2011-08-22 LAB — CBC
HCT: 29.8 % — ABNORMAL LOW (ref 39.0–52.0)
MCH: 32.1 pg (ref 26.0–34.0)
MCHC: 33.6 g/dL (ref 30.0–36.0)
RDW: 12.7 % (ref 11.5–15.5)

## 2011-08-22 LAB — GLUCOSE, CAPILLARY: Glucose-Capillary: 159 mg/dL — ABNORMAL HIGH (ref 70–99)

## 2011-08-22 NOTE — Evaluation (Signed)
Physical Therapy Evaluation Patient Details Name: Eric [name] MRN: [id number] DOB: [birth year] Today's Date: 08/22/2011  Problem List:  Patient Active Problem List  Diagnoses  . DIABETES MELLITUS-TYPE II  . HYPERLIPIDEMIA  . OBESITY  . ANEMIA-NOS  . THROMBOCYTOPENIA  . HYPERTENSION  . CORONARY ARTERY DISEASE  . CARDIOMYOPATHY, ISCHEMIC  . AORTIC SCLEROSIS  . SICK SINUS/ TACHY-BRADY SYNDROME  . PERIPHERAL VASCULAR DISEASE  . ALLERGIC RHINITIS  . COPD  . GERD  . SMALL BOWEL OBSTRUCTION  . DIVERTICULOSIS-COLON  . Irritable bowel syndrome  . LOW BACK PAIN, CHRONIC  . POSTURAL LIGHTHEADEDNESS  . CAROTID BRUIT, RIGHT  . PERSONAL HX PROSTATE CANCER  . HIATAL HERNIA  . Anxiety  . Obstructive sleep apnea    Past Medical History:  Past Medical History  Diagnosis Date  . LACTOSE INTOLERANCE   . OBESITY   . CARDIOMYOPATHY, ISCHEMIC   . AORTIC SCLEROSIS   . SICK SINUS/ TACHY-BRADY SYNDROME 09/2007    s/p PPM st judes  . PERIPHERAL VASCULAR DISEASE   . CAROTID BRUIT, RIGHT 02/27/2008  . IBS (irritable bowel syndrome)   . ALLERGIC RHINITIS   . ANEMIA-NOS   . OA (osteoarthritis)   . COPD   . GERD   . HYPERLIPIDEMIA   . HIATAL HERNIA   . Diverticulosis   . Prostate cancer     seed implants 2004  . DIABETES MELLITUS-TYPE II     diet controlled  . CORONARY ARTERY DISEASE     CABG 1995, PTCA/DES 2008, 2009 and 08/2010  . HYPERTENSION     LOV with Clearance Dr Katrinka Blazing and EKG 2/13 on chart,, llast stress 3/12 on chart  . Asthma     chest  11/12 EPIC/CLEARANCE NOTE DR YOUNG WITH  OV NOTE 08/14/11 EPIC  . Sleep apnea     LOV DR YOUNG 3/13 EPIC   Past Surgical History:  Past Surgical History  Procedure Date  . Ptca 2008, 2009, 2012    with DES  . Partial small bowel obstruction 2009  . Bilateral cataracts   . Pacemaker insertion     DDD/[employer]         Last interrogation 2/13  on chart     Pacemaker guideline order Dr Katrinka Blazing on chart  . Hernia repair   .  Lumbar disc surgery 12/2008  . Coronary artery bypass graft     PT Assessment/Plan/Recommendation PT Assessment Clinical Impression Statement: Pt with R THR presents with decreased R LE ROM/strength and limited functional mobility PT Recommendation/Assessment: Patient will need skilled PT in the acute care venue PT Problem List: Decreased strength;Decreased range of motion;Decreased activity tolerance;Decreased mobility;Decreased knowledge of use of DME;Pain PT Therapy Diagnosis : Difficulty walking PT Plan PT Frequency: 7X/week PT Treatment/Interventions: DME instruction;Gait training;Stair training;Functional mobility training;Therapeutic activities;Therapeutic exercise;Patient/family education PT Recommendation Recommendations for Other Services: OT consult Follow Up Recommendations: Skilled nursing facility Equipment Recommended: Defer to next venue PT Goals  Acute Rehab PT Goals PT Goal Formulation: With patient Time For Goal Achievement: 7 days Pt will go Supine/Side to Sit: with supervision PT Goal: Supine/Side to Sit - Progress: Goal set today Pt will go Sit to Supine/Side: with supervision PT Goal: Sit to Supine/Side - Progress: Goal set today Pt will go Sit to Stand: with supervision PT Goal: Sit to Stand - Progress: Goal set today Pt will go Stand to Sit: with supervision PT Goal: Stand to Sit - Progress: Goal set today Pt will Ambulate: 51 - 150 feet;with  supervision;with rolling walker PT Goal: Ambulate - Progress: Goal set today  PT Evaluation Precautions/Restrictions  Precautions Precautions: Posterior Hip Precaution Comments: sign hung in room Restrictions RLE Weight Bearing: Partial weight bearing RLE Partial Weight Bearing Percentage or Pounds: 25-50% Prior Functioning  Home Living Lives With: Alone Prior Function Level of Independence: Independent with basic ADLs;Independent with gait;Independent with transfers;Requires assistive device for  independence Able to Take Stairs?: Yes Driving: Yes Cognition Cognition Arousal/Alertness: Awake/alert Overall Cognitive Status: Appears within functional limits for tasks assessed Orientation Level: Oriented X4 Sensation/Coordination Coordination Gross Motor Movements are Fluid and Coordinated: Yes Extremity Assessment RUE Assessment RUE Assessment: Within Functional Limits LUE Assessment LUE Assessment: Within Functional Limits RLE Assessment RLE Assessment: Exceptions to [facility] with hip strength 2+/5) LLE Assessment LLE Assessment: Within Functional Limits Mobility (including Balance) Bed Mobility Bed Mobility: Yes Supine to Sit: 1: +2 Total assist Supine to Sit Details (indicate cue type and reason): cues for sequence, use of UEs to self assist and adherence to THP Transfers Transfers: Yes Sit to Stand: 3: Mod assist Sit to Stand Details (indicate cue type and reason): cues for use of UEs and for LE management Stand to Sit: 3: Mod assist Stand to Sit Details: cues for use of UEs and for LE management Ambulation/Gait Ambulation/Gait: Yes Ambulation/Gait Assistance: 3: Mod assist Ambulation/Gait Assistance Details (indicate cue type and reason): cues for sequence, PWB, posture, position from RW and ER on R Ambulation Distance (Feet): 29 Feet Assistive device: Rolling walker Gait Pattern: Step-to pattern    Exercise  Total Joint Exercises Ankle Circles/Pumps: AROM;10 reps;Supine;Both Heel Slides: AAROM;10 reps;Right;Supine Hip ABduction/ADduction: AAROM;10 reps;Right;Supine End of Session PT - End of Session Equipment Utilized During Treatment: Gait belt Activity Tolerance: Patient tolerated treatment well Patient left: in chair;with call bell in reach Nurse Communication: Mobility status for transfers;Mobility status for ambulation General Behavior During Session: [facility] for tasks performed Cognition: [facility] for tasks performed  Dori Devino 08/22/2011,  4:09 PM

## 2011-08-22 NOTE — Progress Notes (Signed)
CPAP setup for pt to use when ready. He has his home mask with our machine at 12 cmH2O & 5L O2. RN says she can help him place it on when ready & will call if needed.  Jacqulynn Cadet RRT

## 2011-08-22 NOTE — Progress Notes (Signed)
Physical Therapy Treatment Patient Details Name: SATISH HAMMERS MRN: [id number] DOB: [birth year] Today's Date: 08/22/2011  R posterior THR POD #1 pm session 14:00 - 14:25 1 gt  1 ta  PT Assessment/Plan  PT - Assessment/Plan Comments on Treatment Session: Pt did well sitting in recliner.  Amb a second time and assisted back to bed.  Pt plans to D/C to SNF for Rehab. PT Plan: Discharge plan remains appropriate Follow Up Recommendations: Skilled nursing facility Equipment Recommended: Defer to next venue PT Goals  Acute Rehab PT Goals PT Goal Formulation: With patient Pt will go Sit to Supine/Side: with supervision PT Goal: Sit to Supine/Side - Progress: Progressing toward goal Pt will go Sit to Stand: with supervision PT Goal: Sit to Stand - Progress: Progressing toward goal Pt will go Stand to Sit: with supervision PT Goal: Stand to Sit - Progress: Progressing toward goal Pt will Ambulate: 51 - 150 feet;with supervision;with rolling walker PT Goal: Ambulate - Progress: Progressing toward goal  PT Treatment Precautions/Restrictions  Precautions Precautions: Posterior Hip Precaution Comments: sign hung in room Restrictions Weight Bearing Restrictions: Yes RLE Weight Bearing: Partial weight bearing RLE Partial Weight Bearing Percentage or Pounds: 25-50% Mobility (including Balance) Bed Mobility Bed Mobility: Yes Sit to Supine: 2: Max assist Sit to Supine - Details (indicate cue type and reason): max assist to support B LE up on bed Transfers Transfers: Yes Sit to Stand: 3: Mod assist;From chair/3-in-1 Sit to Stand Details (indicate cue type and reason): 50% VC's on proper tech on hand placement and R LE extention to avoid hip flex > 90' Stand to Sit: 3: Mod assist Stand to Sit Details: 50% VC's on proper tech on hand placement and R LE extention to avoid hip flex > 90' Ambulation/Gait Ambulation/Gait: Yes Ambulation/Gait Assistance: 3: Mod assist Ambulation/Gait  Assistance Details (indicate cue type and reason): 25% VC's on proper posture and proper walker to self distance as pt tended to have too close. Ambulation Distance (Feet): 35 Feet Assistive device: Rolling walker Gait Pattern: Step-to pattern;Decreased stance time - right Gait velocity: Pt aware he is PWB.  Pt c/o 6/10 hip with amb, applied ICE and rest. Stairs: No Wheelchair Mobility Wheelchair Mobility: No    Exercise    End of Session PT - End of Session Equipment Utilized During Treatment: Gait belt Activity Tolerance: Patient limited by fatigue Patient left: in bed;with call bell in reach General Behavior During Session: [facility] for tasks performed Cognition: [facility] for tasks performed  Felecia Shelling  PTA [facility]  Acute  Rehab Pager     [phone]

## 2011-08-22 NOTE — Progress Notes (Signed)
Subjective:  Patient reports he is sore his right hip he denies any shortness of breath chest pain no nausea he has eaten this morning he does have some soreness in his right ankle which is improved since applying ice  Objective: Vital signs in last 24 hours: Temp:  [96.7 F (35.9 C)-98.5 F (36.9 C)] 97.6 F (36.4 C) (03/16 0602) Pulse Rate:  [59-86] 66  (03/16 0602) Resp:  [9-20] 16  (03/16 0602) BP: (115-154)/(44-82) 115/60 mmHg (03/16 0602) SpO2:  [89 %-100 %] 98 % (03/16 0602) Weight:  [108.863 kg (240 lb)] 108.863 kg (240 lb) (03/15 1700)  Intake/Output from previous day: 03/15 0701 - 03/16 0700 In: 2946.7 [I.V.:2836.7; IV Piggyback:50] Out: 1025 [Urine:425; Blood:600] Intake/Output this shift:     Basename 08/22/11 0440  HGB 10.0*    Basename 08/22/11 0440  WBC 8.3  RBC 3.12*  HCT 29.8*  PLT 178    Basename 08/22/11 0440  NA 133*  K 5.1  CL 102  CO2 25  BUN 22  CREATININE 0.86  GLUCOSE 154*  CALCIUM 10.0   No results found for this basename: LABPT:2,INR:2 in the last 72 hours  Is conscious alert appropriate sitting up in a hospital bed appears to be in no distress in his right hip dressing is intact but he has a moderate amount of soaking with bloody material dressing was taken down he has no active bleeding the wounds approximated with staples no signs of erythema or infection thigh are soft calf is soft leg is neuromotor vascularly intact dressing reapplied his right ankle he is no swelling at this point he is nontender laterally his got good motion with dorsi plantarflexion good pulses good sensation  Assessment/Plan: Postop day #1 status post right total hip arthroplasty doing well Postoperative blood loss anemia asymptomatic we'll monitor vital signs stable and Mild hyponatremia will cut back IV hydration and monitor sodium level Out of bed her physical therapy total hip protocol   Jamelle Rushing 08/22/2011, 9:25 AM

## 2011-08-22 NOTE — Op Note (Signed)
NAME:  Eric [name], Eric [name] NO.:  [id number]  MEDICAL RECORD NO.:  [id number]  LOCATION:  1613                         FACILITY:  [facility]  PHYSICIAN:  Jene Every, M.D.    DATE OF BIRTH:  May 11, 1927  DATE OF PROCEDURE:  08/21/2011 DATE OF DISCHARGE:                              OPERATIVE REPORT   PREOPERATIVE DIAGNOSIS:  Osteoarthritis of the right hip.  POSTOPERATIVE DIAGNOSIS:  Osteoarthritis of the right hip.  PROCEDURE PERFORMED:  Right total hip arthroplasty.  ANESTHESIA:  General.  ASSISTANT:  Roma Schanz, P.A. and Madlyn Frankel. Charlann Boxer, M.D.  HISTORY:  This is an 78 year old with end-stage osteoarthrosis of the hip, refractory to conservative treatment, indicated for replacement of bone-on-bone arthritic hip.  The risks and benefits discussed including bleeding, infection, DVT, PE, dislocation, anesthetic complications, etc.  TECHNIQUE:  With the patient in supine position, after induction of adequate general anesthesia with 2 g Kefzol.  He was placed in the left lateral decubitus position.  All bony prominences were well padded.  The right pelvic stabilizer was utilized.  The hip, pelvic region, and lower extremity were prepped and draped in the usual sterile fashion.  A standard posterolateral approach to the hip was performed.  The incision __________ subcutaneous tissue was dissected.  Electrocautery was utilized to achieve hemostasis.  Fascia lata was identified and divided in line with the skin incision.  Abductor tenotomy performed.  Self- retaining retractor placed.  Piriformis identified, detached, and reflected posteriorly and protected the sciatic nerve throughout the case.  Abductor tenotomy performed.  The capsule excised.  The hip was dislocated and osteoarthritis noted.  With oscillating saw, I performed a femoral neck cut 1 cm above the lesser trochanter.  We then used a step drill and initiator to enter the femoral canal.  T-handle  reamer irrigated, sequentially reamed to 13 mm diameter with good cortical apposition.  We trialed to a 13.5 femoral component.  This fit satisfactorily.  Attention was turned towards the acetabulum and then sequentially reamed to a 55 patella with good cortical bone.  Using the appropriate version, __________ in anteversion and abduction.  We then placed a trial acetabular component, trial femoral component, and a +4 head.  Hip was reduced and found to be stable throughout a full range. Then, turned the attention back towards the acetabulum by replacing the pedicle acetabular shell, 36 mm of diameter, and impacted in abduction and anteversion, 45 abduction and 15 to 20 degrees of anteversion.  It seated down to the cortical cancellous bone.  Placed a screw hole up into the acetabulum, 30 mm screw placed with excellent purchase. Permanent liner neutral +4 was utilized and impacted into place after a set screw was placed at the bottom of the acetabular component.  This was irrigated, turned back towards the femur, and placed an AML 13.5 in the appropriate version and impacted with excellent purchase.  Then, placed a +5 femoral head, trial reduced it, found to be stable throughout full range.  Good equivalent leg lengths.  Good extension, good flexion.  No impingement anteriorly or posteriorly, was redislocated, and we cleaned the trunnion and placed a permanent +5 metal head impacted into place and  reduced it.  Again, it was stable throughout a full range in abduction and internal rotation as well as full extension with the knee flexed.  There was no anterior or posterior impingement and there was equivocal leg lengths.  The wound was copiously irrigated.  Electrocautery was utilized to achieve hemostasis. There was no active bleeding, therefore copiously irrigated.  We had removed the capsule initially.  I then repaired the abductor tenotomy with 1 Vicryl interrupted figure-of-8 sutures.   Fascia lata was repaired with #1 Vicryl interrupted figure-of-8 sutures, subcu with 0 and 2-0 Vicryl simple sutures, skin was reapproximated with staples, and wound was dressed sterilely.  He was placed supine on the hospital table.  Leg lengths were equivalent.  Good pulses distally and immobilizer placed, extubated without difficulty and transported to the recovery room in satisfactory condition.  The patient tolerated the procedure well.  No complications.  Blood loss is 300 cc.     Jene Every, M.D.     Cordelia Pen  D:  08/21/2011  T:  08/22/2011  Job:  [id number]

## 2011-08-23 LAB — GLUCOSE, CAPILLARY
Glucose-Capillary: 123 mg/dL — ABNORMAL HIGH (ref 70–99)
Glucose-Capillary: 136 mg/dL — ABNORMAL HIGH (ref 70–99)

## 2011-08-23 LAB — BASIC METABOLIC PANEL
BUN: 25 mg/dL — ABNORMAL HIGH (ref 6–23)
Calcium: 10.1 mg/dL (ref 8.4–10.5)
Creatinine, Ser: 0.97 mg/dL (ref 0.50–1.35)
GFR calc Af Amer: 87 mL/min — ABNORMAL LOW (ref >90)
GFR calc non Af Amer: 75 mL/min — ABNORMAL LOW (ref >90)
Glucose, Bld: 116 mg/dL — ABNORMAL HIGH (ref 70–99)
Potassium: 4.2 mEq/L (ref 3.5–5.1)

## 2011-08-23 LAB — CBC
HCT: 26.1 % — ABNORMAL LOW (ref 39.0–52.0)
Hemoglobin: 8.7 g/dL — ABNORMAL LOW (ref 13.0–17.0)
MCH: 32.1 pg (ref 26.0–34.0)
MCHC: 33.3 g/dL (ref 30.0–36.0)
RDW: 13 % (ref 11.5–15.5)

## 2011-08-23 MED ORDER — ESOMEPRAZOLE MAGNESIUM 20 MG PO CPDR
20.0000 mg | DELAYED_RELEASE_CAPSULE | Freq: Two times a day (BID) | ORAL | Status: DC
  Administered 2011-08-23 – 2011-08-25 (×4): 20 mg via ORAL
  Filled 2011-08-23 (×5): qty 1

## 2011-08-23 MED ORDER — ESOMEPRAZOLE MAGNESIUM 20 MG PO CPDR
20.0000 mg | DELAYED_RELEASE_CAPSULE | Freq: Two times a day (BID) | ORAL | Status: DC
  Filled 2011-08-23: qty 1

## 2011-08-23 MED ORDER — NON FORMULARY: 20.0000 mg | Freq: Two times a day (BID) | Status: DC

## 2011-08-23 MED ORDER — METHOCARBAMOL 500 MG PO TABS
500.0000 mg | ORAL_TABLET | Freq: Four times a day (QID) | ORAL | Status: DC | PRN
  Administered 2011-08-24: 500 mg via ORAL
  Filled 2011-08-23: qty 1

## 2011-08-23 NOTE — Consult Note (Signed)
CSW met with patient to review SNF options. Patient expressed interest in [facility], [facility], and [facility]. Pt advised of bed search process and will be kept informed of offers.  Marlaine Hind ANN S , MSW, LCSWA 08/23/2011 4:49 PM [phone]

## 2011-08-23 NOTE — Progress Notes (Signed)
Physical Therapy Treatment Patient Details Name: Eric [name] MRN: [id number] DOB: Jan 15, 1927 Today's Date: 08/23/2011  PT Assessment/Plan  PT - Assessment/Plan Comments on Treatment Session: Pt reporting increased R hip pain and fatigue with ambulation.  Co-tx with OT. PT Plan: Discharge plan remains appropriate Follow Up Recommendations: Skilled nursing facility Equipment Recommended: Defer to next venue PT Goals  Acute Rehab PT Goals PT Goal: Supine/Side to Sit - Progress: Progressing toward goal PT Goal: Sit to Stand - Progress: Progressing toward goal PT Goal: Stand to Sit - Progress: Progressing toward goal PT Goal: Ambulate - Progress: Progressing toward goal  PT Treatment Precautions/Restrictions  Precautions Precautions: Posterior Hip Precaution Comments: sign hung in room Required Braces or Orthoses: Yes Knee Immobilizer: Other (comment) (when in Bed or chair to maintain hip precautions) Restrictions Weight Bearing Restrictions: Yes RLE Weight Bearing: Partial weight bearing RLE Partial Weight Bearing Percentage or Pounds: 25-50% Mobility (including Balance) Bed Mobility Bed Mobility: Yes Supine to Sit: 4: Min assist;With rails;HOB elevated (Comment degrees) Supine to Sit Details (indicate cue type and reason): assist for R LE and verbal cues to maintain THP Transfers Transfers: Yes Sit to Stand: 1: +2 Total assist;Patient percentage (comment);With upper extremity assist;From bed;From chair/3-in-1 Sit to Stand Details (indicate cue type and reason): pt=60%, verbal cues for safe technique including R LE forward, hand placement, increased assist from chair 2* lower surface and fatique Stand to Sit: 4: Min assist;To chair/3-in-1;With upper extremity assist;With armrests Stand to Sit Details: assist to support R LE, verbal cues for safe technique Ambulation/Gait Ambulation/Gait: Yes Ambulation/Gait Assistance: 4: Min assist Ambulation/Gait Assistance Details  (indicate cue type and reason): verbal cues for sequence and safe RW distance, pt required one seated rest break 2* fatigue, 10 feet then 7 feet, pt limited distance 2* fatigue and R hip pain Ambulation Distance (Feet): 17 Feet (total) Assistive device: Rolling walker Gait Pattern: Step-to pattern;Decreased stance time - right    Exercise    End of Session PT - End of Session Equipment Utilized During Treatment: Gait belt Activity Tolerance: Patient limited by fatigue;Patient limited by pain Patient left: in chair;with call bell in reach General Behavior During Session: [facility] for tasks performed Cognition: [facility] for tasks performed  Blaise Grieshaber,KATHrine E 08/23/2011, 11:48 AM Pager: [phone]

## 2011-08-23 NOTE — Plan of Care (Signed)
Problem: Phase II Progression Outcomes Goal: Ambulates Outcome: Progressing Pt with oxygen desaturation to 87% on RA with ambulation

## 2011-08-23 NOTE — Progress Notes (Signed)
Subjective: 2 Days Post-Op Procedure(s) (LRB): TOTAL HIP ARTHROPLASTY (Right) Patient reports pain as moderate.   Denies CP or SOB.  Voiding without difficulty. Positive flatus. He is c/o of cramping in foot otherwise doing ok Objective: Vital signs in last 24 hours: Temp:  [97.5 F (36.4 C)-97.7 F (36.5 C)] 97.6 F (36.4 C) (03/17 0522) Pulse Rate:  [67-77] 71  (03/17 0522) Resp:  [16-18] 16  (03/17 0522) BP: (106-127)/(48-70) 127/70 mmHg (03/17 0522) SpO2:  [91 %-100 %] 91 % (03/17 0522)  Intake/Output from previous day: 03/16 0701 - 03/17 0700 In: 1080 [P.O.:1080] Out: 900 [Urine:900] Intake/Output this shift:     Basename 08/23/11 0525 08/22/11 0440  HGB 8.7* 10.0*    Basename 08/23/11 0525 08/22/11 0440  WBC 7.9 8.3  RBC 2.71* 3.12*  HCT 26.1* 29.8*  PLT 161 178    Basename 08/23/11 0525 08/22/11 0440  NA 132* 133*  K 4.2 5.1  CL 99 102  CO2 26 25  BUN 25* 22  CREATININE 0.97 0.86  GLUCOSE 116* 154*  CALCIUM 10.1 10.0   No results found for this basename: LABPT:2,INR:2 in the last 72 hours  Neurologically intact Sensation intact distally Dorsiflexion/Plantar flexion intact Incision: dressing C/D/I Compartment soft No neurologic deficits on right foot    Assessment/Plan: 2 Days Post-Op Procedure(s) (LRB): TOTAL HIP ARTHROPLASTY (Right) Up with therapy D/C IV fluids Keep immobilizer loose Will monitor Hbg Mild hyponatremia D/c foley  Monique Hefty R. 08/23/2011, 7:35 AM

## 2011-08-23 NOTE — Evaluation (Signed)
Occupational Therapy Evaluation Patient Details Name: Eric [name] MRN: [id number] DOB: 01-Apr-1927 Today's Date: 08/23/2011  Problem List:  Patient Active Problem List  Diagnoses  . DIABETES MELLITUS-TYPE II  . HYPERLIPIDEMIA  . OBESITY  . ANEMIA-NOS  . THROMBOCYTOPENIA  . HYPERTENSION  . CORONARY ARTERY DISEASE  . CARDIOMYOPATHY, ISCHEMIC  . AORTIC SCLEROSIS  . SICK SINUS/ TACHY-BRADY SYNDROME  . PERIPHERAL VASCULAR DISEASE  . ALLERGIC RHINITIS  . COPD  . GERD  . SMALL BOWEL OBSTRUCTION  . DIVERTICULOSIS-COLON  . Irritable bowel syndrome  . LOW BACK PAIN, CHRONIC  . POSTURAL LIGHTHEADEDNESS  . CAROTID BRUIT, RIGHT  . PERSONAL HX PROSTATE CANCER  . HIATAL HERNIA  . Anxiety  . Obstructive sleep apnea    Past Medical History:  Past Medical History  Diagnosis Date  . LACTOSE INTOLERANCE   . OBESITY   . CARDIOMYOPATHY, ISCHEMIC   . AORTIC SCLEROSIS   . SICK SINUS/ TACHY-BRADY SYNDROME 09/2007    s/p PPM st judes  . PERIPHERAL VASCULAR DISEASE   . CAROTID BRUIT, RIGHT 02/27/2008  . IBS (irritable bowel syndrome)   . ALLERGIC RHINITIS   . ANEMIA-NOS   . OA (osteoarthritis)   . COPD   . GERD   . HYPERLIPIDEMIA   . HIATAL HERNIA   . Diverticulosis   . Prostate cancer     seed implants 2004  . DIABETES MELLITUS-TYPE II     diet controlled  . CORONARY ARTERY DISEASE     CABG 1995, PTCA/DES 2008, 2009 and 08/2010  . HYPERTENSION     LOV with Clearance Dr Katrinka Blazing and EKG 2/13 on chart,, llast stress 3/12 on chart  . Asthma     chest  11/12 EPIC/CLEARANCE NOTE DR YOUNG WITH  OV NOTE 08/14/11 EPIC  . Sleep apnea     LOV DR YOUNG 3/13 EPIC   Past Surgical History:  Past Surgical History  Procedure Date  . Ptca 2008, 2009, 2012    with DES  . Partial small bowel obstruction 2009  . Bilateral cataracts   . Pacemaker insertion     DDD/[employer]         Last interrogation 2/13  on chart     Pacemaker guideline order Dr Katrinka Blazing on chart  . Hernia repair   .  Lumbar disc surgery 12/2008  . Coronary artery bypass graft     OT Assessment/Plan/Recommendation OT Assessment Clinical Impression Statement: 78 yo male s/p Rt THA that wears hearing aides [facility]) that could benefit from skilled OT acutely.  OT Recommendation/Assessment: Patient will need skilled OT in the acute care venue OT Problem List: Decreased strength;Decreased activity tolerance;Impaired balance (sitting and/or standing);Decreased safety awareness;Decreased knowledge of use of DME or AE;Decreased knowledge of precautions;Pain OT Therapy Diagnosis : Generalized weakness;Acute pain OT Plan OT Frequency: Min 2X/week OT Treatment/Interventions: Self-care/ADL training;DME and/or AE instruction;Therapeutic activities;Patient/family education;Balance training OT Recommendation Follow Up Recommendations: Skilled nursing facility Equipment Recommended: Defer to next venue Individuals Consulted Consulted and Agree with Results and Recommendations: Patient OT Goals Acute Rehab OT Goals OT Goal Formulation: With patient Time For Goal Achievement: 2 weeks ADL Goals Pt Will Perform Upper Body Bathing: with set-up;Sitting, chair;Supported ADL Goal: Product manager - Progress: Goal set today Pt Will Perform Lower Body Bathing: with supervision;Sit to stand from chair ADL Goal: Lower Body Bathing - Progress: Goal set today Pt Will Perform Upper Body Dressing: with set-up;Sitting, chair;Unsupported ADL Goal: Upper Body Dressing - Progress: Goal set today Pt  Will Perform Lower Body Dressing: with supervision;Sit to stand from chair;Sit to stand from bed;Unsupported ADL Goal: Lower Body Dressing - Progress: Goal set today Pt Will Transfer to Toilet: with supervision;Ambulation;3-in-1 ADL Goal: Toilet Transfer - Progress: Goal set today Pt Will Perform Toileting - Clothing Manipulation: with supervision;Sitting on 3-in-1 or toilet ADL Goal: Toileting - Clothing Manipulation - Progress: Goal set  today Pt Will Perform Toileting - Hygiene: with supervision;Standing at 3-in-1/toilet ADL Goal: Toileting - Hygiene - Progress: Goal set today Miscellaneous OT Goals Miscellaneous OT Goal #1: PT will complete bed mobility MOD I as precursor to ADLS OT Goal: Miscellaneous Goal #1 - Progress: Goal set today  OT Evaluation Precautions/Restrictions  Precautions Precautions: Posterior Hip Precaution Comments: sign hung in room Required Braces or Orthoses: Yes Knee Immobilizer: Other (comment) (when in Bed or chair to maintain hip precautions) Restrictions Weight Bearing Restrictions: Yes RLE Weight Bearing: Partial weight bearing RLE Partial Weight Bearing Percentage or Pounds: 25-50% Prior Functioning Home Living Lives With: Alone Additional Comments: plans to d/c SNF Prior Function Level of Independence: Independent with basic ADLs;Independent with gait;Independent with transfers;Requires assistive device for independence Able to Take Stairs?: Yes Driving: Yes ADL ADL Eating/Feeding: Performed;Set up Where Assessed - Eating/Feeding: Chair Grooming: Simulated;Wash/dry hands;Set up Where Assessed - Grooming: Sitting, chair Upper Body Bathing: Simulated;Chest;Right arm;Left arm;Abdomen;Minimal assistance Where Assessed - Upper Body Bathing: Sitting, chair;Supported Lower Body Bathing: Simulated;Maximal assistance Where Assessed - Lower Body Bathing: Sitting, chair;Supported Location manager Dressing: Performed;Minimal assistance Where Assessed - Upper Body Dressing: Sitting, bed;Unsupported Toilet Transfer: Simulated;Moderate assistance Toilet Transfer Method: Stand pivot Toilet Transfer Equipment: Raised toilet seat with arms (or 3-in-1 over toilet) Equipment Used: Rolling walker Ambulation Related to ADLs: Pt ambulating Min (A) with c/o dizziness and fatigue. Pt with decreasd Oxgyen with ambulation to 87% on RA. PT with BP 10461 ADL Comments: Pt c/o KI positioning yesterday and  states "it has put me back 2 days" Pt with KI positioned lower and pt without c/o of location during session or after. Pt continues to discuess the previous KI positioning the entire session. Pt c/o dizziness and demonstrated lack of awareness of decreased oxgyen levels though symptomic. Pt could benefit from oxgyen during session to maximize independence. Vision/Perception  Vision - History Baseline Vision: Wears glasses all the time Patient Visual Report: No change from baseline Vision - Assessment Vision Assessment: Vision not tested Cognition Cognition Arousal/Alertness: Awake/alert Overall Cognitive Status: Appears within functional limits for tasks assessed Orientation Level: Oriented X4 Sensation/Coordination Coordination Gross Motor Movements are Fluid and Coordinated: Yes Fine Motor Movements are Fluid and Coordinated: Yes Extremity Assessment RUE Assessment RUE Assessment: Within Functional Limits (grossly) LUE Assessment LUE Assessment: Within Functional Limits (grossly) Mobility  Bed Mobility Bed Mobility: Yes Supine to Sit: 4: Min assist;With rails;HOB elevated (Comment degrees) Supine to Sit Details (indicate cue type and reason): Min (A) to hold Le for bed mobility Transfers Transfers: Yes Sit to Stand: 1: +2 Total assist;Patient percentage (comment);From chair/3-in-1;With upper extremity assist;With armrests (pt 60%) Sit to Stand Details (indicate cue type and reason): PT sit<>stand from bed surface with one person (A) however pt stand<>sit in chair due to fatigue. Pt required total+2 (A) to sit<>stand from chair. Pt required min v/c  Stand to Sit: 4: Min assist;To chair/3-in-1;With upper extremity assist;With armrests Stand to Sit Details: min (A) to supprot Rt LE while descending to chair Exercises   End of Session OT - End of Session Activity Tolerance: Patient tolerated treatment well Patient left:  in chair;with call bell in reach Nurse Communication:  Mobility status for transfers;Mobility status for ambulation General Behavior During Session: [facility] for tasks performed Cognition: [facility] for tasks performed   Lucile Shutters 08/23/2011, 10:29 AM  Pager: [phone]

## 2011-08-23 NOTE — Progress Notes (Signed)
Pt placed on cpap 12 cmH2O with 5L O2 at this time. Pt tolerating well.  Jacqulynn Cadet RRT

## 2011-08-24 LAB — BASIC METABOLIC PANEL
BUN: 18 mg/dL (ref 6–23)
CO2: 27 mEq/L (ref 19–32)
Chloride: 100 mEq/L (ref 96–112)
Glucose, Bld: 131 mg/dL — ABNORMAL HIGH (ref 70–99)
Potassium: 4.2 mEq/L (ref 3.5–5.1)
Sodium: 132 mEq/L — ABNORMAL LOW (ref 135–145)

## 2011-08-24 LAB — CBC
HCT: 24.6 % — ABNORMAL LOW (ref 39.0–52.0)
Hemoglobin: 8.2 g/dL — ABNORMAL LOW (ref 13.0–17.0)
RBC: 2.55 MIL/uL — ABNORMAL LOW (ref 4.22–5.81)
WBC: 5.5 10*3/uL (ref 4.0–10.5)

## 2011-08-24 LAB — HEMOGLOBIN AND HEMATOCRIT, BLOOD
HCT: 26.1 % — ABNORMAL LOW (ref 39.0–52.0)
Hemoglobin: 8.6 g/dL — ABNORMAL LOW (ref 13.0–17.0)

## 2011-08-24 MED ORDER — ENOXAPARIN SODIUM 40 MG/0.4ML ~~LOC~~ SOLN: 40.0000 mg | SUBCUTANEOUS | Status: DC

## 2011-08-24 MED ORDER — HYDROCODONE-ACETAMINOPHEN 7.5-325 MG PO TABS: 1.0000 | ORAL_TABLET | ORAL | Status: DC | PRN

## 2011-08-24 MED ORDER — METHOCARBAMOL 500 MG PO TABS: 500.0000 mg | ORAL_TABLET | Freq: Four times a day (QID) | ORAL | Status: DC | PRN

## 2011-08-24 MED ORDER — DSS 100 MG PO CAPS: 100.0000 mg | ORAL_CAPSULE | Freq: Two times a day (BID) | ORAL | Status: AC

## 2011-08-24 MED ORDER — FERROUS SULFATE 325 (65 FE) MG PO TABS: 325.0000 mg | ORAL_TABLET | Freq: Three times a day (TID) | ORAL | Status: DC

## 2011-08-24 MED ORDER — FERROUS SULFATE 325 (65 FE) MG PO TABS
325.0000 mg | ORAL_TABLET | Freq: Three times a day (TID) | ORAL | Status: DC
  Administered 2011-08-24 – 2011-08-25 (×3): 325 mg via ORAL
  Filled 2011-08-24: qty 1
  Filled 2011-08-24: qty 2

## 2011-08-24 MED ORDER — CLOPIDOGREL BISULFATE 75 MG PO TABS
75.0000 mg | ORAL_TABLET | Freq: Every day | ORAL | Status: DC
  Administered 2011-08-25: 75 mg via ORAL
  Filled 2011-08-24: qty 1

## 2011-08-24 NOTE — Progress Notes (Signed)
CSW assisting with d/c planning. FL2 in shadow chart for MD signature. Pt has ST SNF bed available at [facility] . CSW will assist with d/c planning to SNF when pt is stable. Met with pt and daughter today and they are agreeable with d/c plan.  Cori Razor  LCSW  [phone]

## 2011-08-24 NOTE — Progress Notes (Signed)
Pt placed on CPAP for the night at current setting of 12 cmH2O with 2 L/M O2 bled-in.  Pt is using his home mask and is tolerating well at this time.  Pt encouraged to notify RN/ RT of any concerns.

## 2011-08-24 NOTE — Progress Notes (Addendum)
Physical Therapy Treatment Patient Details Name: Eric [name] MRN: [id number] DOB: 22-Apr-1927 Today's Date: 08/24/2011  PT Assessment/Plan  PT - Assessment/Plan Comments on Treatment Session: Pt assisted to bathroom for BM then ambulated short distance in hallway until he became lightheaded.  Pt fixating on KI experience from first night postop so did not apply while in chair for pt comfort.  Reviewed and demonstrated all hip precautions with pt. PT Plan: Discharge plan remains appropriate Follow Up Recommendations: Skilled nursing facility Equipment Recommended: Defer to next venue PT Goals  Acute Rehab PT Goals PT Goal: Sit to Stand - Progress: Progressing toward goal PT Goal: Stand to Sit - Progress: Progressing toward goal PT Goal: Ambulate - Progress: Progressing toward goal  PT Treatment Precautions/Restrictions  Precautions Precautions: Posterior Hip Precaution Comments: sign hung in room Required Braces or Orthoses: Yes Knee Immobilizer: Other (comment) (when in Bed or chair to maintain hip precautions) Restrictions Weight Bearing Restrictions: Yes RLE Weight Bearing: Partial weight bearing RLE Partial Weight Bearing Percentage or Pounds: 25-50 Mobility (including Balance) Bed Mobility Bed Mobility: No Transfers Transfers: Yes Sit to Stand: 4: Min assist;With upper extremity assist;With armrests;From chair/3-in-1 Sit to Stand Details (indicate cue type and reason): verbal cues for maintaining THP, verbal cue to use grab bar near toilet Stand to Sit: 4: Min assist;To chair/3-in-1;With upper extremity assist;With armrests Stand to Sit Details: verbal cues for maintaining THP, verbal cue for grab bar Ambulation/Gait Ambulation/Gait: Yes Ambulation/Gait Assistance: 4: Min assist Ambulation/Gait Assistance Details (indicate cue type and reason): min/guard, verbal cues for safety (pt rushing to get to bathroom), pt assisted to bathroom then performed 20 feet until  reporting feeling "funny" and recliner brought to pt, pt felt immediately better upon rest in recliner, pt ambulated on 2L O2. Ambulation Distance (Feet): 20 Feet Assistive device: Rolling walker Gait Pattern: Step-to pattern;Decreased stance time - right    Exercise  Total Joint Exercises Ankle Circles/Pumps: AROM;Both;5 reps;Supine End of Session PT - End of Session Equipment Utilized During Treatment: Gait belt Activity Tolerance: Patient limited by fatigue Patient left: in chair;with call bell in reach;with family/visitor present General Behavior During Session: [name] for tasks performed Cognition: [name] for tasks performed  [name],KATHrine E 08/24/2011, 12:16 PM Pager: [phone]

## 2011-08-24 NOTE — Progress Notes (Signed)
Subjective: 3 Days Post-Op Procedure(s) (LRB): TOTAL HIP ARTHROPLASTY (Right) Patient reports pain as 4 on 0-10 scale.   Denies CP or SOB.  He did have some dizziness when he got out of bed earlier. Voiding without difficulty Leg feels much better with immobilizer off.  Objective: Vital signs in last 24 hours: Temp:  [97.2 F (36.2 C)-98 F (36.7 C)] 97.8 F (36.6 C) (03/18 0529) Pulse Rate:  [73-89] 77  (03/18 0529) Resp:  [16-18] 16  (03/18 0529) BP: (114-121)/(61-66) 118/66 mmHg (03/18 0529) SpO2:  [90 %-98 %] 90 % (03/18 0529)  Intake/Output from previous day: 03/17 0701 - 03/18 0700 In: 1080 [P.O.:1080] Out: 615 [Urine:615] Intake/Output this shift: Total I/O In: 240 [P.O.:240] Out: 100 [Urine:100]   Basename 08/24/11 0448 08/23/11 0525 08/22/11 0440  HGB 8.2* 8.7* 10.0*    Basename 08/24/11 0448 08/23/11 0525  WBC 5.5 7.9  RBC 2.55* 2.71*  HCT 24.6* 26.1*  PLT 145* 161    Basename 08/24/11 0448 08/23/11 0525  NA 132* 132*  K 4.2 4.2  CL 100 99  CO2 27 26  BUN 18 25*  CREATININE 0.91 0.97  GLUCOSE 131* 116*  CALCIUM 10.2 10.1   No results found for this basename: LABPT:2,INR:2 in the last 72 hours  Neurologically intact Neurovascular intact Sensation intact distally Incision: dressing C/D/I Compartment soft  Assessment/Plan: 3 Days Post-Op Procedure(s) (LRB): TOTAL HIP ARTHROPLASTY (Right) Will repeat H/H if less than 8 will transfuse secondary to cardiac history Will start Iron supplements Cont Lovenox/resume Plavix Possible d/c tomorrow if hemodynamically stable  Rionna Feltes R. 08/24/2011, 11:42 AM

## 2011-08-24 NOTE — Discharge Summary (Addendum)
Patient ID: Eric [name] MRN: [id number] DOB/AGE: Mar 21, 1927 78 y.o.  Admit date: 08/21/2011 Discharge date: 09/06/11  Admission Diagnoses:  DJD right hip Past Medical History  Diagnosis Date  . LACTOSE INTOLERANCE   . OBESITY   . CARDIOMYOPATHY, ISCHEMIC   . AORTIC SCLEROSIS   . SICK SINUS/ TACHY-BRADY SYNDROME 09/2007    s/p PPM st judes  . PERIPHERAL VASCULAR DISEASE   . CAROTID BRUIT, RIGHT 02/27/2008  . IBS (irritable bowel syndrome)   . ALLERGIC RHINITIS   . ANEMIA-NOS   . OA (osteoarthritis)   . COPD   . GERD   . HYPERLIPIDEMIA   . HIATAL HERNIA   . Diverticulosis   . Prostate cancer     seed implants 2004  . DIABETES MELLITUS-TYPE II     diet controlled  . CORONARY ARTERY DISEASE     CABG 1995, PTCA/DES 2008, 2009 and 08/2010  . HYPERTENSION     LOV with Clearance Dr Katrinka Blazing and EKG 2/13 on chart,, llast stress 3/12 on chart  . Asthma     chest  11/12 EPIC/CLEARANCE NOTE DR YOUNG WITH  OV NOTE 08/14/11 EPIC  . Sleep apnea     LOV DR YOUNG 3/13 EPIC   Discharge Diagnoses:  Same S/p right THA Stable post op anemia Asymptomatic hyponatremia  Surgeries: Procedure(s): TOTAL HIP ARTHROPLASTY on 08/21/2011   Consultants:  PT/OT, care management  Discharged Condition: Improved  Hospital Course: Eric [name] is an 78 y.o. male who was admitted 08/21/2011 for operative treatment of DJD right hip. Patient has severe unremitting pain that affects sleep, daily activities, and work/hobbies. After pre-op clearance the patient was taken to the operating room on 08/21/2011 and underwent  Procedure(s): TOTAL HIP ARTHROPLASTY.    Patient was given perioperative antibiotics: Anti-infectives     Start     Dose/Rate Route Frequency Ordered Stop   08/21/11 1830   ceFAZolin (ANCEF) IVPB 2 g/50 mL premix        2 g 100 mL/hr over 30 Minutes Intravenous Every 6 hours 08/21/11 1719 08/22/11 0643   08/21/11 1350   polymyxin B 500,000 Units, bacitracin 50,000 Units in sodium  chloride irrigation 0.9 % 500 mL irrigation  Status:  Discontinued          As needed 08/21/11 1350 08/21/11 1508   08/21/11 1023   ceFAZolin (ANCEF) IVPB 2 g/50 mL premix  Status:  Discontinued        2 g 100 mL/hr over 30 Minutes Intravenous 60 min pre-op 08/21/11 1023 08/21/11 1659           Patient was given sequential compression devices, early ambulation, and chemoprophylaxis to prevent DVT.  Patient benefited maximally from hospital stay and there were no complications.  He did have post operative blood loss but remained stable and felt ready for discharge. Recent vital signs: Patient Vitals for the past 24 hrs:  BP Temp Temp src Pulse Resp SpO2  September 06, 2011 1020 - - - - - 98 %  09-06-11 0529 118/66 mmHg 97.8 F (36.6 C) Oral 77  16  90 %  Sep 06, 2011 0025 - - - - - 98 %  08/23/11 2134 114/64 mmHg 98 F (36.7 C) Oral 89  16  90 %  08/23/11 2116 - - - 89  18  97 %  08/23/11 1348 121/61 mmHg 97.2 F (36.2 C) Oral 73  16  98 %     Recent laboratory studies:  Basename 06-Sep-2011 1157 September 06, 2011 0448  08/23/11 0525  WBC -- 5.5 7.9  HGB 8.6* 8.2* --  HCT 26.1* 24.6* --  PLT -- 145* 161  NA -- 132* 132*  K -- 4.2 4.2  CL -- 100 99  CO2 -- 27 26  BUN -- 18 25*  CREATININE -- 0.91 0.97  GLUCOSE -- 131* 116*  INR -- -- --  CALCIUM -- 10.2 --     Discharge Medications:   Medication List  As of 08/24/2011  1:45 PM   STOP taking these medications         aspirin 325 MG EC tablet      HYDROcodone-acetaminophen 5-325 MG per tablet      naproxen sodium 220 MG tablet         TAKE these medications         albuterol (2.5 MG/3ML) 0.083% nebulizer solution   Commonly known as: PROVENTIL   Take 2.5 mg by nebulization every 4 (four) hours as needed. Wheezing        PROVENTIL HFA 108 (90 BASE) MCG/ACT inhaler   Generic drug: albuterol   Inhale 2 puffs into the lungs every 4 (four) hours as needed. Wheezing        albuterol-ipratropium 18-103 MCG/ACT inhaler   Commonly  known as: COMBIVENT   Inhale 2 puffs into the lungs every 6 (six) hours as needed. Wheezing        aluminum & magnesium hydroxide 225-200 MG/5ML suspension   Commonly known as: MAALOX   Take 15 mLs by mouth every 6 (six) hours as needed. Heart burn        amLODipine 5 MG tablet   Commonly known as: NORVASC   Take 2.5 mg by mouth daily after lunch daily after lunch.      bumetanide 1 MG tablet   Commonly known as: BUMEX   Take 1 mg by mouth daily after breakfast.      clopidogrel 75 MG tablet   Commonly known as: PLAVIX   Take 75 mg by mouth daily after breakfast.      DSS 100 MG Caps   Take 100 mg by mouth 2 (two) times daily.      enoxaparin 40 MG/0.4ML injection   Commonly known as: LOVENOX   Inject 0.4 mLs (40 mg total) into the skin daily.      esomeprazole 40 MG capsule   Commonly known as: NEXIUM   Take 40 mg by mouth daily before breakfast.      ferrous sulfate 325 (65 FE) MG tablet   Take 1 tablet (325 mg total) by mouth 3 (three) times daily with meals.      fluticasone 50 MCG/ACT nasal spray   Commonly known as: FLONASE   Place 2 sprays into the nose daily as needed. Allergies        HYDROcodone-acetaminophen 7.5-325 MG per tablet   Commonly known as: NORCO   Take 1-2 tablets by mouth every 4 (four) hours as needed.      isosorbide mononitrate 60 MG 24 hr tablet   Commonly known as: IMDUR   60 mg BID per pt since last visit with Dr Katrinka Blazing      methocarbamol 500 MG tablet   Commonly known as: ROBAXIN   Take 1 tablet (500 mg total) by mouth every 6 (six) hours as needed.      Niacin CR 1000 MG Tbcr   Take 1,000 mg by mouth at bedtime.      nitroGLYCERIN 0.4 MG SL tablet  Commonly known as: NITROSTAT   Place 0.4 mg under the tongue every 5 (five) minutes as needed. Chest pain        polyethylene glycol packet   Commonly known as: MIRALAX / GLYCOLAX   Take 17 g by mouth daily as needed. Constipation        pravastatin 20 MG tablet    Commonly known as: PRAVACHOL   Take 60 mg by mouth at bedtime.      senna 8.6 MG tablet   Commonly known as: SENOKOT   Take 2 tablets by mouth 2 (two) times a week.      SYSTANE 0.4-0.3 % Soln   Generic drug: Polyethyl Glycol-Propyl Glycol   Apply 2 drops to eye at bedtime.      valsartan 160 MG tablet   Commonly known as: DIOVAN   Take 160 mg by mouth 2 (two) times daily.            Diagnostic Studies: Dg Hip Complete Right  08/13/2011  *RADIOLOGY REPORT*  Clinical Data: Preoperative films.  Patient for hip replacement.  RIGHT HIP - COMPLETE 2+ VIEW  Comparison: None.  Findings: The patient has right much worse than left hip osteoarthritis.  There is no fracture.  Lower lumbar degenerative change is present.  Penile prosthesis and radiotherapy seeds for prostate carcinoma are noted.  IMPRESSION: 1.  Right much worse than left degenerative disease.  Negative for fracture or focal abnormality. 2.  Lower lumbar spondylosis.  Original Report Authenticated By: Bernadene Bell. D'ALESSIO, M.D.   Dg Hip Portable 1 View Right  08/21/2011  *RADIOLOGY REPORT*  Clinical Data: Postop right total hip replacement.  PORTABLE RIGHT HIP - 1 VIEW  Comparison: 08/13/2011  Findings: Changes of right hip replacement.  No hardware or bony complicating feature. No fracture, subluxation or dislocation. Prostate radiation seeds are noted.  IMPRESSION: Right hip replacement without complicating feature.  Original Report Authenticated By: Cyndie Chime, M.D.    Disposition: plan for SNF  Discharge Orders    Future Appointments: Provider: Department: Dept Phone: Center:   11/11/2011 11:00 AM Waymon Budge, MD [facility] [phone] None     Future Orders Please Complete By Expires   Diet - low sodium heart healthy      Call MD / Call 911      Comments:   If you experience chest pain or shortness of breath, CALL 911 and be transported to the hospital emergency room.  If you develope a fever above 101 F, pus  (white drainage) or increased drainage or redness at the wound, or calf pain, call your surgeon's office.   Constipation Prevention      Comments:   Drink plenty of fluids.  Prune juice may be helpful.  You may use a stool softener, such as Colace (over the counter) 100 mg twice a day.  Use MiraLax (over the counter) for constipation as needed.   Increase activity slowly as tolerated      Weight Bearing as taught in Physical Therapy      Scheduling Instructions:   25-50%   Comments:   Use a walker or crutches as instructed.   Discharge instructions      Comments:   Pick up stool softner and laxative for home. Do not submerge incision under water. May shower. Hip precautions.  Total Hip Protocol.    Follow the hip precautions as taught in Physical Therapy      Change dressing  Comments:   change the dressing daily with sterile 4 x 4 inch gauze dressing and paper tape.   TED hose      Comments:   Use stockings (TED hose) for 4 weeks on both leg(s).  You may remove them at night for sleeping.       Hemoglobin is stable and patient is doing well.  He is ready for d/c to [facility] 08/25/2011. Follow up with Dr Shelle Iron 14 days from surgery. He does not need to use the knee immobilizer.  Signed: Ramces Shomaker R. 08/24/2011, 1:45 PM

## 2011-08-25 DIAGNOSIS — Z96649 Presence of unspecified artificial hip joint: Secondary | ICD-10-CM | POA: Diagnosis not present

## 2011-08-25 DIAGNOSIS — Z7901 Long term (current) use of anticoagulants: Secondary | ICD-10-CM | POA: Diagnosis not present

## 2011-08-25 DIAGNOSIS — M169 Osteoarthritis of hip, unspecified: Secondary | ICD-10-CM | POA: Diagnosis not present

## 2011-08-25 DIAGNOSIS — E039 Hypothyroidism, unspecified: Secondary | ICD-10-CM | POA: Diagnosis not present

## 2011-08-25 DIAGNOSIS — D649 Anemia, unspecified: Secondary | ICD-10-CM | POA: Diagnosis not present

## 2011-08-25 DIAGNOSIS — I2510 Atherosclerotic heart disease of native coronary artery without angina pectoris: Secondary | ICD-10-CM | POA: Diagnosis not present

## 2011-08-25 DIAGNOSIS — Z471 Aftercare following joint replacement surgery: Secondary | ICD-10-CM | POA: Diagnosis not present

## 2011-08-25 DIAGNOSIS — M1990 Unspecified osteoarthritis, unspecified site: Secondary | ICD-10-CM | POA: Diagnosis not present

## 2011-08-25 DIAGNOSIS — M1610 Unilateral primary osteoarthritis, unspecified hip: Secondary | ICD-10-CM | POA: Diagnosis not present

## 2011-08-25 DIAGNOSIS — J449 Chronic obstructive pulmonary disease, unspecified: Secondary | ICD-10-CM | POA: Diagnosis not present

## 2011-08-25 DIAGNOSIS — I10 Essential (primary) hypertension: Secondary | ICD-10-CM | POA: Diagnosis not present

## 2011-08-25 DIAGNOSIS — E119 Type 2 diabetes mellitus without complications: Secondary | ICD-10-CM | POA: Diagnosis not present

## 2011-08-25 DIAGNOSIS — Z4801 Encounter for change or removal of surgical wound dressing: Secondary | ICD-10-CM | POA: Diagnosis not present

## 2011-08-25 DIAGNOSIS — M6281 Muscle weakness (generalized): Secondary | ICD-10-CM | POA: Diagnosis not present

## 2011-08-25 DIAGNOSIS — R7309 Other abnormal glucose: Secondary | ICD-10-CM | POA: Diagnosis not present

## 2011-08-25 DIAGNOSIS — E785 Hyperlipidemia, unspecified: Secondary | ICD-10-CM | POA: Diagnosis not present

## 2011-08-25 DIAGNOSIS — Z5189 Encounter for other specified aftercare: Secondary | ICD-10-CM | POA: Diagnosis not present

## 2011-08-25 DIAGNOSIS — M25559 Pain in unspecified hip: Secondary | ICD-10-CM | POA: Diagnosis not present

## 2011-08-25 DIAGNOSIS — Z951 Presence of aortocoronary bypass graft: Secondary | ICD-10-CM | POA: Diagnosis not present

## 2011-08-25 DIAGNOSIS — I428 Other cardiomyopathies: Secondary | ICD-10-CM | POA: Diagnosis not present

## 2011-08-25 DIAGNOSIS — G8918 Other acute postprocedural pain: Secondary | ICD-10-CM | POA: Diagnosis not present

## 2011-08-25 DIAGNOSIS — R262 Difficulty in walking, not elsewhere classified: Secondary | ICD-10-CM | POA: Diagnosis not present

## 2011-08-25 DIAGNOSIS — I2589 Other forms of chronic ischemic heart disease: Secondary | ICD-10-CM | POA: Diagnosis not present

## 2011-08-25 LAB — BASIC METABOLIC PANEL
CO2: 28 mEq/L (ref 19–32)
Chloride: 99 mEq/L (ref 96–112)
Creatinine, Ser: 0.88 mg/dL (ref 0.50–1.35)
GFR calc Af Amer: >90 mL/min (ref >90)
Sodium: 131 mEq/L — ABNORMAL LOW (ref 135–145)

## 2011-08-25 NOTE — Progress Notes (Signed)
Subjective: 4 Days Post-Op Procedure(s) (LRB): TOTAL HIP ARTHROPLASTY (Right) Patient reports pain as mild.    Patient has no complaints.  He is feeling well today and ready to go to SNF  Objective: Vital signs in last 24 hours: Temp:  [97.6 F (36.4 C)-98.6 F (37 C)] 97.6 F (36.4 C) (03/19 0555) Pulse Rate:  [61-79] 61  (03/19 0555) Resp:  [16] 16  (03/19 0555) BP: (115-147)/(50-70) 127/70 mmHg (03/19 0555) SpO2:  [95 %-99 %] 96 % (03/19 0555)  Intake/Output from previous day:  Intake/Output Summary (Last 24 hours) at 08/25/11 0831 Last data filed at 08/25/11 0308  Gross per 24 hour  Intake    600 ml  Output    700 ml  Net   -100 ml    Intake/Output this shift:    Labs: Results for orders placed during the hospital encounter of 08/21/11  TYPE AND SCREEN      Component Value Range   ABO/RH(D) A POS     Antibody Screen NEG     Sample Expiration 08/24/2011    GLUCOSE, CAPILLARY      Component Value Range   Glucose-Capillary 108 (*) 70 - 99 (mg/dL)   Comment 1 Documented in Chart    GLUCOSE, CAPILLARY      Component Value Range   Glucose-Capillary 137 (*) 70 - 99 (mg/dL)   Comment 1 Documented in Chart     Comment 2 Notify RN    CBC      Component Value Range   WBC 8.3  4.0 - 10.5 (K/uL)   RBC 3.12 (*) 4.22 - 5.81 (MIL/uL)   Hemoglobin 10.0 (*) 13.0 - 17.0 (g/dL)   HCT 16.1 (*) 09.6 - 52.0 (%)   MCV 95.5  78.0 - 100.0 (fL)   MCH 32.1  26.0 - 34.0 (pg)   MCHC 33.6  30.0 - 36.0 (g/dL)   RDW 04.5  40.9 - 81.1 (%)   Platelets 178  150 - 400 (K/uL)  BASIC METABOLIC PANEL      Component Value Range   Sodium 133 (*) 135 - 145 (mEq/L)   Potassium 5.1  3.5 - 5.1 (mEq/L)   Chloride 102  96 - 112 (mEq/L)   CO2 25  19 - 32 (mEq/L)   Glucose, Bld 154 (*) 70 - 99 (mg/dL)   BUN 22  6 - 23 (mg/dL)   Creatinine, Ser 9.14  0.50 - 1.35 (mg/dL)   Calcium 78.2  8.4 - 10.5 (mg/dL)   GFR calc non Af Amer 79 (*) >90 (mL/min)   GFR calc Af Amer >90  >90 (mL/min)  GLUCOSE,  CAPILLARY      Component Value Range   Glucose-Capillary 157 (*) 70 - 99 (mg/dL)  GLUCOSE, CAPILLARY      Component Value Range   Glucose-Capillary 147 (*) 70 - 99 (mg/dL)  GLUCOSE, CAPILLARY      Component Value Range   Glucose-Capillary 159 (*) 70 - 99 (mg/dL)  CBC      Component Value Range   WBC 7.9  4.0 - 10.5 (K/uL)   RBC 2.71 (*) 4.22 - 5.81 (MIL/uL)   Hemoglobin 8.7 (*) 13.0 - 17.0 (g/dL)   HCT 95.6 (*) 21.3 - 52.0 (%)   MCV 96.3  78.0 - 100.0 (fL)   MCH 32.1  26.0 - 34.0 (pg)   MCHC 33.3  30.0 - 36.0 (g/dL)   RDW 08.6  57.8 - 46.9 (%)   Platelets 161  150 - 400 (  K/uL)  BASIC METABOLIC PANEL      Component Value Range   Sodium 132 (*) 135 - 145 (mEq/L)   Potassium 4.2  3.5 - 5.1 (mEq/L)   Chloride 99  96 - 112 (mEq/L)   CO2 26  19 - 32 (mEq/L)   Glucose, Bld 116 (*) 70 - 99 (mg/dL)   BUN 25 (*) 6 - 23 (mg/dL)   Creatinine, Ser 1.61  0.50 - 1.35 (mg/dL)   Calcium 09.6  8.4 - 10.5 (mg/dL)   GFR calc non Af Amer 75 (*) >90 (mL/min)   GFR calc Af Amer 87 (*) >90 (mL/min)  GLUCOSE, CAPILLARY      Component Value Range   Glucose-Capillary 135 (*) 70 - 99 (mg/dL)  GLUCOSE, CAPILLARY      Component Value Range   Glucose-Capillary 149 (*) 70 - 99 (mg/dL)  GLUCOSE, CAPILLARY      Component Value Range   Glucose-Capillary 123 (*) 70 - 99 (mg/dL)  GLUCOSE, CAPILLARY      Component Value Range   Glucose-Capillary 136 (*) 70 - 99 (mg/dL)  CBC      Component Value Range   WBC 5.5  4.0 - 10.5 (K/uL)   RBC 2.55 (*) 4.22 - 5.81 (MIL/uL)   Hemoglobin 8.2 (*) 13.0 - 17.0 (g/dL)   HCT 04.5 (*) 40.9 - 52.0 (%)   MCV 96.5  78.0 - 100.0 (fL)   MCH 32.2  26.0 - 34.0 (pg)   MCHC 33.3  30.0 - 36.0 (g/dL)   RDW 81.1  91.4 - 78.2 (%)   Platelets 145 (*) 150 - 400 (K/uL)  BASIC METABOLIC PANEL      Component Value Range   Sodium 132 (*) 135 - 145 (mEq/L)   Potassium 4.2  3.5 - 5.1 (mEq/L)   Chloride 100  96 - 112 (mEq/L)   CO2 27  19 - 32 (mEq/L)   Glucose, Bld 131 (*) 70 - 99  (mg/dL)   BUN 18  6 - 23 (mg/dL)   Creatinine, Ser 9.56  0.50 - 1.35 (mg/dL)   Calcium 21.3  8.4 - 10.5 (mg/dL)   GFR calc non Af Amer 77 (*) >90 (mL/min)   GFR calc Af Amer 89 (*) >90 (mL/min)  GLUCOSE, CAPILLARY      Component Value Range   Glucose-Capillary 148 (*) 70 - 99 (mg/dL)  HEMOGLOBIN AND HEMATOCRIT, BLOOD      Component Value Range   Hemoglobin 8.6 (*) 13.0 - 17.0 (g/dL)   HCT 08.6 (*) 57.8 - 52.0 (%)  BASIC METABOLIC PANEL      Component Value Range   Sodium 131 (*) 135 - 145 (mEq/L)   Potassium 4.3  3.5 - 5.1 (mEq/L)   Chloride 99  96 - 112 (mEq/L)   CO2 28  19 - 32 (mEq/L)   Glucose, Bld 152 (*) 70 - 99 (mg/dL)   BUN 21  6 - 23 (mg/dL)   Creatinine, Ser 4.69  0.50 - 1.35 (mg/dL)   Calcium 62.9  8.4 - 10.5 (mg/dL)   GFR calc non Af Amer 78 (*) >90 (mL/min)   GFR calc Af Amer >90  >90 (mL/min)    Exam: Neurologically intact Neurovascular intact Sensation intact distally Dorsiflexion/Plantar flexion intact Incision: dressing C/D/I Compartment soft  Motor function intact - moving foot and toes well on exam.  Assessment/Plan: 4 Days Post-Op Procedure(s) (LRB): TOTAL HIP ARTHROPLASTY (Right) Discharge to SNF Procedure(s) (LRB): TOTAL HIP ARTHROPLASTY (Right) Past Medical History  Diagnosis Date  . LACTOSE INTOLERANCE   . OBESITY   . CARDIOMYOPATHY, ISCHEMIC   . AORTIC SCLEROSIS   . SICK SINUS/ TACHY-BRADY SYNDROME 09/2007    s/p PPM st judes  . PERIPHERAL VASCULAR DISEASE   . CAROTID BRUIT, RIGHT 02/27/2008  . IBS (irritable bowel syndrome)   . ALLERGIC RHINITIS   . ANEMIA-NOS   . OA (osteoarthritis)   . COPD   . GERD   . HYPERLIPIDEMIA   . HIATAL HERNIA   . Diverticulosis   . Prostate cancer     seed implants 2004  . DIABETES MELLITUS-TYPE II     diet controlled  . CORONARY ARTERY DISEASE     CABG 1995, PTCA/DES 2008, 2009 and 08/2010  . HYPERTENSION     LOV with Clearance Dr Katrinka Blazing and EKG 2/13 on chart,, llast stress 3/12 on chart  .  Asthma     chest  11/12 EPIC/CLEARANCE NOTE DR YOUNG WITH  OV NOTE 08/14/11 EPIC  . Sleep apnea     LOV DR YOUNG 3/13 EPIC   D/c to SNF Lovenox/Plavix F/u JCB 14 days from surgery THA precautions  {Steven Veazie R. 08/25/2011, 8:32 AM

## 2011-08-25 NOTE — Progress Notes (Signed)
Pt was d/c to [facility] today via [facility] for ST rehab.  Cori Razor  LCSW  [phone]

## 2011-08-25 NOTE — Progress Notes (Signed)
Physical Therapy Treatment Patient Details Name: Eric [name] MRN: [id number] DOB: 10-Dec-1926 Today's Date: 08/25/2011  PT Assessment/Plan  PT - Assessment/Plan Comments on Treatment Session: pt unable to state his PWB status; participates with PT with max encouragement PT Plan: Discharge plan remains appropriate PT Frequency: 7X/week Follow Up Recommendations: Skilled nursing facility Equipment Recommended: Defer to next venue PT Goals  Acute Rehab PT Goals Pt will go Supine/Side to Sit: with supervision PT Goal: Supine/Side to Sit - Progress: Met Pt will go Sit to Stand: with supervision PT Goal: Sit to Stand - Progress: Progressing toward goal Pt will go Stand to Sit: with supervision PT Goal: Stand to Sit - Progress: Progressing toward goal Pt will Ambulate: 51 - 150 feet;with supervision;with rolling walker PT Goal: Ambulate - Progress: Progressing toward goal  PT Treatment Precautions/Restrictions  Precautions Precautions: Posterior Hip Precaution Comments: sign hung in room Required Braces or Orthoses: Yes Knee Immobilizer: Other (comment) (when in Bed or chair to maintain hip precautions) Restrictions Weight Bearing Restrictions: Yes RLE Weight Bearing: Partial weight bearing RLE Partial Weight Bearing Percentage or Pounds: 25-50 Mobility (including Balance) Bed Mobility Supine to Sit: 5: Supervision;With rails;HOB elevated (Comment degrees) Supine to Sit Details (indicate cue type and reason): cues for sequence, use of UEs to self assist and adherence to THP Transfers Sit to Stand: 5: Supervision;4: Min assist;From bed;With upper extremity assist Sit to Stand Details (indicate cue type and reason): cues for sequence, use of UEs to self assist and adherence to THP Stand to Sit: 4: Min assist;With upper extremity assist;Other (comment) (stretcher) Stand to Sit Details: verbal cues for maintaining THP, verbal cue for hand placement Ambulation/Gait Ambulation/Gait  Assistance: 4: Min assist;5: Supervision Ambulation/Gait Assistance Details (indicate cue type and reason): cues for sequence Ambulation Distance (Feet): 40 Feet Assistive device: Rolling walker Gait Pattern: Step-to pattern;Decreased stance time - right    Exercise    End of Session PT - End of Session Equipment Utilized During Treatment: Gait belt Activity Tolerance: Patient tolerated treatment well Patient left:  (stretcher) General Behavior During Session: [facility] for tasks performed Cognition: [facility] for tasks performed  [name] 08/25/2011, 11:49 AM

## 2011-08-26 LAB — GLUCOSE, CAPILLARY
Glucose-Capillary: 129 mg/dL — ABNORMAL HIGH (ref 70–99)
Glucose-Capillary: 119 mg/dL — ABNORMAL HIGH (ref 70–99)
Glucose-Capillary: 145 mg/dL — ABNORMAL HIGH (ref 70–99)
Glucose-Capillary: 138 mg/dL — ABNORMAL HIGH (ref 70–99)

## 2011-08-28 DIAGNOSIS — M1610 Unilateral primary osteoarthritis, unspecified hip: Secondary | ICD-10-CM | POA: Diagnosis not present

## 2011-09-01 DIAGNOSIS — I10 Essential (primary) hypertension: Secondary | ICD-10-CM | POA: Diagnosis not present

## 2011-09-01 DIAGNOSIS — E119 Type 2 diabetes mellitus without complications: Secondary | ICD-10-CM | POA: Diagnosis not present

## 2011-09-01 DIAGNOSIS — M1610 Unilateral primary osteoarthritis, unspecified hip: Secondary | ICD-10-CM | POA: Diagnosis not present

## 2011-09-01 DIAGNOSIS — I428 Other cardiomyopathies: Secondary | ICD-10-CM | POA: Diagnosis not present

## 2011-09-01 DIAGNOSIS — E785 Hyperlipidemia, unspecified: Secondary | ICD-10-CM | POA: Diagnosis not present

## 2011-09-01 DIAGNOSIS — I2510 Atherosclerotic heart disease of native coronary artery without angina pectoris: Secondary | ICD-10-CM | POA: Diagnosis not present

## 2011-09-02 ENCOUNTER — Encounter (HOSPITAL_COMMUNITY): Payer: Self-pay | Admitting: Specialist

## 2011-09-03 DIAGNOSIS — G8918 Other acute postprocedural pain: Secondary | ICD-10-CM | POA: Diagnosis not present

## 2011-09-07 DIAGNOSIS — M169 Osteoarthritis of hip, unspecified: Secondary | ICD-10-CM | POA: Diagnosis not present

## 2011-09-18 DIAGNOSIS — J449 Chronic obstructive pulmonary disease, unspecified: Secondary | ICD-10-CM | POA: Diagnosis not present

## 2011-09-18 DIAGNOSIS — M1610 Unilateral primary osteoarthritis, unspecified hip: Secondary | ICD-10-CM | POA: Diagnosis not present

## 2011-09-19 DIAGNOSIS — Z471 Aftercare following joint replacement surgery: Secondary | ICD-10-CM | POA: Diagnosis not present

## 2011-09-19 DIAGNOSIS — I2510 Atherosclerotic heart disease of native coronary artery without angina pectoris: Secondary | ICD-10-CM | POA: Diagnosis not present

## 2011-09-19 DIAGNOSIS — I10 Essential (primary) hypertension: Secondary | ICD-10-CM | POA: Diagnosis not present

## 2011-09-19 DIAGNOSIS — J449 Chronic obstructive pulmonary disease, unspecified: Secondary | ICD-10-CM | POA: Diagnosis not present

## 2011-09-19 DIAGNOSIS — K219 Gastro-esophageal reflux disease without esophagitis: Secondary | ICD-10-CM | POA: Diagnosis not present

## 2011-09-19 DIAGNOSIS — Z96649 Presence of unspecified artificial hip joint: Secondary | ICD-10-CM | POA: Diagnosis not present

## 2011-09-19 DIAGNOSIS — I739 Peripheral vascular disease, unspecified: Secondary | ICD-10-CM | POA: Diagnosis not present

## 2011-09-19 DIAGNOSIS — R269 Unspecified abnormalities of gait and mobility: Secondary | ICD-10-CM | POA: Diagnosis not present

## 2011-09-19 DIAGNOSIS — E119 Type 2 diabetes mellitus without complications: Secondary | ICD-10-CM | POA: Diagnosis not present

## 2011-09-21 DIAGNOSIS — I10 Essential (primary) hypertension: Secondary | ICD-10-CM | POA: Diagnosis not present

## 2011-09-21 DIAGNOSIS — I2510 Atherosclerotic heart disease of native coronary artery without angina pectoris: Secondary | ICD-10-CM | POA: Diagnosis not present

## 2011-09-21 DIAGNOSIS — E119 Type 2 diabetes mellitus without complications: Secondary | ICD-10-CM | POA: Diagnosis not present

## 2011-09-21 DIAGNOSIS — R269 Unspecified abnormalities of gait and mobility: Secondary | ICD-10-CM | POA: Diagnosis not present

## 2011-09-21 DIAGNOSIS — J449 Chronic obstructive pulmonary disease, unspecified: Secondary | ICD-10-CM | POA: Diagnosis not present

## 2011-09-21 DIAGNOSIS — Z471 Aftercare following joint replacement surgery: Secondary | ICD-10-CM | POA: Diagnosis not present

## 2011-09-23 DIAGNOSIS — R269 Unspecified abnormalities of gait and mobility: Secondary | ICD-10-CM | POA: Diagnosis not present

## 2011-09-23 DIAGNOSIS — I10 Essential (primary) hypertension: Secondary | ICD-10-CM | POA: Diagnosis not present

## 2011-09-23 DIAGNOSIS — Z471 Aftercare following joint replacement surgery: Secondary | ICD-10-CM | POA: Diagnosis not present

## 2011-09-23 DIAGNOSIS — E119 Type 2 diabetes mellitus without complications: Secondary | ICD-10-CM | POA: Diagnosis not present

## 2011-09-23 DIAGNOSIS — J449 Chronic obstructive pulmonary disease, unspecified: Secondary | ICD-10-CM | POA: Diagnosis not present

## 2011-09-23 DIAGNOSIS — I2510 Atherosclerotic heart disease of native coronary artery without angina pectoris: Secondary | ICD-10-CM | POA: Diagnosis not present

## 2011-09-24 DIAGNOSIS — I10 Essential (primary) hypertension: Secondary | ICD-10-CM | POA: Diagnosis not present

## 2011-09-24 DIAGNOSIS — Z471 Aftercare following joint replacement surgery: Secondary | ICD-10-CM | POA: Diagnosis not present

## 2011-09-24 DIAGNOSIS — R269 Unspecified abnormalities of gait and mobility: Secondary | ICD-10-CM | POA: Diagnosis not present

## 2011-09-24 DIAGNOSIS — I2510 Atherosclerotic heart disease of native coronary artery without angina pectoris: Secondary | ICD-10-CM | POA: Diagnosis not present

## 2011-09-24 DIAGNOSIS — J449 Chronic obstructive pulmonary disease, unspecified: Secondary | ICD-10-CM | POA: Diagnosis not present

## 2011-09-24 DIAGNOSIS — E119 Type 2 diabetes mellitus without complications: Secondary | ICD-10-CM | POA: Diagnosis not present

## 2011-09-25 DIAGNOSIS — I10 Essential (primary) hypertension: Secondary | ICD-10-CM | POA: Diagnosis not present

## 2011-09-25 DIAGNOSIS — R269 Unspecified abnormalities of gait and mobility: Secondary | ICD-10-CM | POA: Diagnosis not present

## 2011-09-25 DIAGNOSIS — Z471 Aftercare following joint replacement surgery: Secondary | ICD-10-CM | POA: Diagnosis not present

## 2011-09-25 DIAGNOSIS — E119 Type 2 diabetes mellitus without complications: Secondary | ICD-10-CM | POA: Diagnosis not present

## 2011-09-25 DIAGNOSIS — J449 Chronic obstructive pulmonary disease, unspecified: Secondary | ICD-10-CM | POA: Diagnosis not present

## 2011-09-25 DIAGNOSIS — I2510 Atherosclerotic heart disease of native coronary artery without angina pectoris: Secondary | ICD-10-CM | POA: Diagnosis not present

## 2011-09-28 DIAGNOSIS — Z471 Aftercare following joint replacement surgery: Secondary | ICD-10-CM | POA: Diagnosis not present

## 2011-09-28 DIAGNOSIS — I2510 Atherosclerotic heart disease of native coronary artery without angina pectoris: Secondary | ICD-10-CM | POA: Diagnosis not present

## 2011-09-28 DIAGNOSIS — I10 Essential (primary) hypertension: Secondary | ICD-10-CM | POA: Diagnosis not present

## 2011-09-28 DIAGNOSIS — J449 Chronic obstructive pulmonary disease, unspecified: Secondary | ICD-10-CM | POA: Diagnosis not present

## 2011-09-28 DIAGNOSIS — E119 Type 2 diabetes mellitus without complications: Secondary | ICD-10-CM | POA: Diagnosis not present

## 2011-09-28 DIAGNOSIS — R269 Unspecified abnormalities of gait and mobility: Secondary | ICD-10-CM | POA: Diagnosis not present

## 2011-09-30 DIAGNOSIS — R269 Unspecified abnormalities of gait and mobility: Secondary | ICD-10-CM | POA: Diagnosis not present

## 2011-09-30 DIAGNOSIS — J449 Chronic obstructive pulmonary disease, unspecified: Secondary | ICD-10-CM | POA: Diagnosis not present

## 2011-09-30 DIAGNOSIS — E119 Type 2 diabetes mellitus without complications: Secondary | ICD-10-CM | POA: Diagnosis not present

## 2011-09-30 DIAGNOSIS — I10 Essential (primary) hypertension: Secondary | ICD-10-CM | POA: Diagnosis not present

## 2011-09-30 DIAGNOSIS — I2510 Atherosclerotic heart disease of native coronary artery without angina pectoris: Secondary | ICD-10-CM | POA: Diagnosis not present

## 2011-09-30 DIAGNOSIS — Z471 Aftercare following joint replacement surgery: Secondary | ICD-10-CM | POA: Diagnosis not present

## 2011-10-01 DIAGNOSIS — Z471 Aftercare following joint replacement surgery: Secondary | ICD-10-CM | POA: Diagnosis not present

## 2011-10-01 DIAGNOSIS — R269 Unspecified abnormalities of gait and mobility: Secondary | ICD-10-CM | POA: Diagnosis not present

## 2011-10-01 DIAGNOSIS — E119 Type 2 diabetes mellitus without complications: Secondary | ICD-10-CM | POA: Diagnosis not present

## 2011-10-01 DIAGNOSIS — J449 Chronic obstructive pulmonary disease, unspecified: Secondary | ICD-10-CM | POA: Diagnosis not present

## 2011-10-01 DIAGNOSIS — I2510 Atherosclerotic heart disease of native coronary artery without angina pectoris: Secondary | ICD-10-CM | POA: Diagnosis not present

## 2011-10-01 DIAGNOSIS — I10 Essential (primary) hypertension: Secondary | ICD-10-CM | POA: Diagnosis not present

## 2011-10-02 DIAGNOSIS — I10 Essential (primary) hypertension: Secondary | ICD-10-CM | POA: Diagnosis not present

## 2011-10-02 DIAGNOSIS — Z471 Aftercare following joint replacement surgery: Secondary | ICD-10-CM | POA: Diagnosis not present

## 2011-10-02 DIAGNOSIS — J449 Chronic obstructive pulmonary disease, unspecified: Secondary | ICD-10-CM | POA: Diagnosis not present

## 2011-10-02 DIAGNOSIS — R269 Unspecified abnormalities of gait and mobility: Secondary | ICD-10-CM | POA: Diagnosis not present

## 2011-10-02 DIAGNOSIS — I2510 Atherosclerotic heart disease of native coronary artery without angina pectoris: Secondary | ICD-10-CM | POA: Diagnosis not present

## 2011-10-02 DIAGNOSIS — E119 Type 2 diabetes mellitus without complications: Secondary | ICD-10-CM | POA: Diagnosis not present

## 2011-10-05 ENCOUNTER — Ambulatory Visit (INDEPENDENT_AMBULATORY_CARE_PROVIDER_SITE_OTHER): Payer: Medicare Other | Admitting: Internal Medicine

## 2011-10-05 ENCOUNTER — Other Ambulatory Visit (INDEPENDENT_AMBULATORY_CARE_PROVIDER_SITE_OTHER): Payer: Medicare Other

## 2011-10-05 ENCOUNTER — Encounter: Payer: Self-pay | Admitting: Internal Medicine

## 2011-10-05 VITALS — BP 112/42 | HR 76 | Temp 97.9°F | Ht 64.0 in | Wt 229.4 lb

## 2011-10-05 DIAGNOSIS — I2510 Atherosclerotic heart disease of native coronary artery without angina pectoris: Secondary | ICD-10-CM

## 2011-10-05 DIAGNOSIS — M25551 Pain in right hip: Secondary | ICD-10-CM

## 2011-10-05 DIAGNOSIS — D62 Acute posthemorrhagic anemia: Secondary | ICD-10-CM | POA: Diagnosis not present

## 2011-10-05 DIAGNOSIS — I10 Essential (primary) hypertension: Secondary | ICD-10-CM | POA: Diagnosis not present

## 2011-10-05 DIAGNOSIS — M169 Osteoarthritis of hip, unspecified: Secondary | ICD-10-CM | POA: Diagnosis not present

## 2011-10-05 DIAGNOSIS — Z471 Aftercare following joint replacement surgery: Secondary | ICD-10-CM | POA: Diagnosis not present

## 2011-10-05 DIAGNOSIS — M25559 Pain in unspecified hip: Secondary | ICD-10-CM | POA: Diagnosis not present

## 2011-10-05 DIAGNOSIS — E119 Type 2 diabetes mellitus without complications: Secondary | ICD-10-CM | POA: Diagnosis not present

## 2011-10-05 DIAGNOSIS — R269 Unspecified abnormalities of gait and mobility: Secondary | ICD-10-CM | POA: Diagnosis not present

## 2011-10-05 DIAGNOSIS — J449 Chronic obstructive pulmonary disease, unspecified: Secondary | ICD-10-CM | POA: Diagnosis not present

## 2011-10-05 LAB — CBC WITH DIFFERENTIAL/PLATELET
Basophils Absolute: 0 10*3/uL (ref 0.0–0.1)
Basophils Relative: 0.3 % (ref 0.0–3.0)
Eosinophils Relative: 6.6 % — ABNORMAL HIGH (ref 0.0–5.0)
HCT: 36.2 % — ABNORMAL LOW (ref 39.0–52.0)
Hemoglobin: 12 g/dL — ABNORMAL LOW (ref 13.0–17.0)
Lymphocytes Relative: 10.9 % — ABNORMAL LOW (ref 12.0–46.0)
Lymphs Abs: 0.5 10*3/uL — ABNORMAL LOW (ref 0.7–4.0)
Monocytes Relative: 13.8 % — ABNORMAL HIGH (ref 3.0–12.0)
Neutro Abs: 3.1 10*3/uL (ref 1.4–7.7)
RBC: 3.69 Mil/uL — ABNORMAL LOW (ref 4.22–5.81)
WBC: 4.6 10*3/uL (ref 4.5–10.5)

## 2011-10-05 NOTE — Assessment & Plan Note (Signed)
BP Readings from Last 3 Encounters:  10/05/11 112/42  08/25/11 135/59  08/25/11 135/59   The current medical regimen is effective;  continue present plan and medications.

## 2011-10-05 NOTE — Assessment & Plan Note (Signed)
Follows with Dr. Katrinka Blazing for same - Stent 08/2010 after evaluation for anginal symptoms, also hx CABG The current medical regimen is effective;  continue present plan and medications.

## 2011-10-05 NOTE — Patient Instructions (Addendum)
It was good to see you today. We have reviewed your interval hospital records including labs and tests today Test(s) ordered today. Your results will be called to you after review (48-72hours after test completion). If any changes need to be made, you will be notified at that time. Medications reviewed, updated at this time -  Please schedule followup in 4 months for diabetes and cholesterol check, call sooner if problems.

## 2011-10-05 NOTE — Progress Notes (Signed)
Subjective:    Patient ID: Eric [name], male    DOB: 10-12-1926, 78 y.o.   MRN: [id number]  HPI  Here for follow up -reviewed chronic medical issues:  CAD, hx CABG and multiple PTCA/DES - hosp 08/2010 reviewed with resolution of shoulder pain following DES. Follows with local cards ([facility]) - no residual CP/neck or shoulder symptoms  - the patient reports compliance with medication(s) as prescribed. Denies adverse side effects.  chronic pain -low back pain and DDD -hx falls early 2011 but none since starting steroid shots (ramos) Also R hip DJD, s/p injection and THA 08/2011 - much improved Down to 2-3 pain pills. day  DM2, diet controlled -  Previously stopped Actos due to fluid retention and rash- Prev followed with endo, kerr q48mo- but off all meds including metformin so follows DM here checks sugars infreq - only goes over 120 if eat something sweet   HTN - reports compliance with ongoing medical treatment and no changes in medication dose or frequency. denies adverse side effects related to current therapy.   dyslipidemia - on statin - reports compliance with ongoing medical treatment and no changes in medication dose or frequency. denies adverse side effects related to current therapy.   GERD - reports compliance with ongoing medical treatment and no changes in medication dose or frequency. denies adverse side effects related to current therapy.   "nerves" - anxious about move 10/2011 to independent apartment, low dose prn xanax rx 01/2011, not needing same now  Past Medical History  Diagnosis Date  . LACTOSE INTOLERANCE   . OBESITY   . CARDIOMYOPATHY, ISCHEMIC   . AORTIC SCLEROSIS   . SICK SINUS/ TACHY-BRADY SYNDROME 09/2007    s/p PPM st judes  . PERIPHERAL VASCULAR DISEASE   . CAROTID BRUIT, RIGHT 02/27/2008  . IBS (irritable bowel syndrome)   . ALLERGIC RHINITIS   . ANEMIA-NOS   . OA (osteoarthritis)   . COPD   . GERD   . HYPERLIPIDEMIA   . HIATAL HERNIA   .  Diverticulosis   . Prostate cancer     seed implants 2004  . DIABETES MELLITUS-TYPE II     diet controlled  . CORONARY ARTERY DISEASE     CABG 1995, PTCA/DES 2008, 2009 and 08/2010  . HYPERTENSION   . Asthma   . Sleep apnea     Review of Systems  Constitutional: Negative for fever and fatigue.  Respiratory: Negative for cough, shortness of breath and wheezing.   Cardiovascular: Negative for chest pain, palpitations and leg swelling.      Objective:   Physical Exam  BP 112/42  Pulse 76  Temp(Src) 97.9 F (36.6 C) (Oral)  Ht 5\' 4"  (1.626 m)  Wt 229 lb 6.4 oz (104.055 kg)  BMI 39.38 kg/m2  SpO2 95% Wt Readings from Last 3 Encounters:  10/05/11 229 lb 6.4 oz (104.055 kg)  08/21/11 240 lb (108.863 kg)  08/21/11 240 lb (108.863 kg)   Constitutional:  Overweight. appears well-developed and well-nourished. No distress at rest- ambulates with cane asst.  Neck: thick and short, Normal range of motion. Neck supple. No JVD present. No thyromegaly present.  Cardiovascular: Normal rate, regular rhythm and normal heart sounds.  No murmur heard. trace BLE edema Pulmonary/Chest: Effort normal and breath sounds normal. No respiratory distress. no wheezes.  Neurological: he is alert and oriented to person, place, and time. No cranial nerve deficit. Coordination normal.  Psychiatric: he has a normal mood and affect. behavior is  normal. Judgment and thought content normal.      Lab Results  Component Value Date   WBC 5.5 08/24/2011   HGB 8.6* 08/24/2011   HCT 26.1* 08/24/2011   PLT 145* 08/24/2011   CHOL 100 10/23/2010   TRIG 109.0 10/23/2010   HDL 33.80* 10/23/2010   ALT 15 08/13/2011   AST 19 08/13/2011   NA 131* 08/25/2011   K 4.3 08/25/2011   CL 99 08/25/2011   CREATININE 0.88 08/25/2011   BUN 21 08/25/2011   CO2 28 08/25/2011   TSH 1.19 04/28/2010   PSA 0.02* 09/10/2006   INR 1.00 08/13/2011   HGBA1C 6.8* 04/23/2011   MICROALBUR 0.5 04/18/2009    Assessment & Plan:  See problem list.  Medications and labs reviewed today.  ABL anemia - would like to stop iron if blood counts have recovered post op - will check same now and stop if Hgb>10

## 2011-10-06 DIAGNOSIS — Z471 Aftercare following joint replacement surgery: Secondary | ICD-10-CM | POA: Diagnosis not present

## 2011-10-06 DIAGNOSIS — E119 Type 2 diabetes mellitus without complications: Secondary | ICD-10-CM | POA: Diagnosis not present

## 2011-10-06 DIAGNOSIS — R269 Unspecified abnormalities of gait and mobility: Secondary | ICD-10-CM | POA: Diagnosis not present

## 2011-10-06 DIAGNOSIS — J449 Chronic obstructive pulmonary disease, unspecified: Secondary | ICD-10-CM | POA: Diagnosis not present

## 2011-10-06 DIAGNOSIS — H01009 Unspecified blepharitis unspecified eye, unspecified eyelid: Secondary | ICD-10-CM | POA: Diagnosis not present

## 2011-10-06 DIAGNOSIS — I2510 Atherosclerotic heart disease of native coronary artery without angina pectoris: Secondary | ICD-10-CM | POA: Diagnosis not present

## 2011-10-06 DIAGNOSIS — H02059 Trichiasis without entropian unspecified eye, unspecified eyelid: Secondary | ICD-10-CM | POA: Diagnosis not present

## 2011-10-06 DIAGNOSIS — I10 Essential (primary) hypertension: Secondary | ICD-10-CM | POA: Diagnosis not present

## 2011-10-07 DIAGNOSIS — J449 Chronic obstructive pulmonary disease, unspecified: Secondary | ICD-10-CM | POA: Diagnosis not present

## 2011-10-07 DIAGNOSIS — E119 Type 2 diabetes mellitus without complications: Secondary | ICD-10-CM | POA: Diagnosis not present

## 2011-10-07 DIAGNOSIS — I10 Essential (primary) hypertension: Secondary | ICD-10-CM | POA: Diagnosis not present

## 2011-10-07 DIAGNOSIS — Z471 Aftercare following joint replacement surgery: Secondary | ICD-10-CM | POA: Diagnosis not present

## 2011-10-07 DIAGNOSIS — I2510 Atherosclerotic heart disease of native coronary artery without angina pectoris: Secondary | ICD-10-CM | POA: Diagnosis not present

## 2011-10-07 DIAGNOSIS — R269 Unspecified abnormalities of gait and mobility: Secondary | ICD-10-CM | POA: Diagnosis not present

## 2011-10-09 ENCOUNTER — Ambulatory Visit: Payer: Medicare Other | Admitting: Internal Medicine

## 2011-10-09 ENCOUNTER — Telehealth: Payer: Self-pay | Admitting: Internal Medicine

## 2011-10-09 DIAGNOSIS — E119 Type 2 diabetes mellitus without complications: Secondary | ICD-10-CM | POA: Diagnosis not present

## 2011-10-09 DIAGNOSIS — I10 Essential (primary) hypertension: Secondary | ICD-10-CM | POA: Diagnosis not present

## 2011-10-09 DIAGNOSIS — I2510 Atherosclerotic heart disease of native coronary artery without angina pectoris: Secondary | ICD-10-CM | POA: Diagnosis not present

## 2011-10-09 DIAGNOSIS — Z471 Aftercare following joint replacement surgery: Secondary | ICD-10-CM | POA: Diagnosis not present

## 2011-10-09 DIAGNOSIS — R269 Unspecified abnormalities of gait and mobility: Secondary | ICD-10-CM | POA: Diagnosis not present

## 2011-10-09 DIAGNOSIS — J449 Chronic obstructive pulmonary disease, unspecified: Secondary | ICD-10-CM | POA: Diagnosis not present

## 2011-10-09 NOTE — Telephone Encounter (Signed)
Returning call can be reached at [phone].Eric [name]

## 2011-10-09 NOTE — Telephone Encounter (Signed)
Called, spoke with pt.  States he is getting exposed to second hand smoke in his apartment.  This is affecting his breathing.  Therefore, pt will be moving out and ending lease on this apartment early.  Pt would like Dr. Maple Hudson to write a letter including his dx and explaining why he cannot be around smoke so he can get his deposit back.  He would like to pick up letter when ready.  Dr. Maple Hudson, pls advise if you will be willing to do this.  Thank you.

## 2011-10-09 NOTE — Telephone Encounter (Signed)
Letter dictated

## 2011-10-09 NOTE — Letter (Signed)
October 05, 2011   Laqueta Jean [address] [place], [place] [address]  RE:  DAMARE, SERANO MRN:  [id number]  /  DOB:  [birth year]   To whom it may concern:  Mr. Godbolt is under my medical care for lung disease including asthma, COPD, and obstructive sleep apnea complicated by heart disease.  It is important for his health that he avoid cigarette smoke.  Unfortunately, he needs to move to an environment where he is not exposed to cigarette smoke.   Sincerely,     Cleo Villamizar D. Maple Hudson, MD, FCCP, FACP   CDY/MedQ  DD: 10/09/2011  DT: 10/09/2011  Job #: [id number]

## 2011-10-09 NOTE — Telephone Encounter (Signed)
LMTCB x 1 

## 2011-10-13 DIAGNOSIS — I2510 Atherosclerotic heart disease of native coronary artery without angina pectoris: Secondary | ICD-10-CM | POA: Diagnosis not present

## 2011-10-13 DIAGNOSIS — R269 Unspecified abnormalities of gait and mobility: Secondary | ICD-10-CM | POA: Diagnosis not present

## 2011-10-13 DIAGNOSIS — E119 Type 2 diabetes mellitus without complications: Secondary | ICD-10-CM | POA: Diagnosis not present

## 2011-10-13 DIAGNOSIS — Z471 Aftercare following joint replacement surgery: Secondary | ICD-10-CM | POA: Diagnosis not present

## 2011-10-13 DIAGNOSIS — I10 Essential (primary) hypertension: Secondary | ICD-10-CM | POA: Diagnosis not present

## 2011-10-13 DIAGNOSIS — J449 Chronic obstructive pulmonary disease, unspecified: Secondary | ICD-10-CM | POA: Diagnosis not present

## 2011-10-13 NOTE — Telephone Encounter (Signed)
Katie, have you seen this letter come up. Please advise thanks

## 2011-10-13 NOTE — Telephone Encounter (Signed)
Spoke with patient-aware that letter is at front for pick up. His phone kept breaking up/losing signal but he does understand the letter is ready.

## 2011-10-26 ENCOUNTER — Encounter: Payer: Self-pay | Admitting: Internal Medicine

## 2011-10-26 ENCOUNTER — Ambulatory Visit (INDEPENDENT_AMBULATORY_CARE_PROVIDER_SITE_OTHER): Payer: Medicare Other | Admitting: Internal Medicine

## 2011-10-26 VITALS — BP 120/60 | HR 76 | Ht 68.0 in | Wt 231.2 lb

## 2011-10-26 DIAGNOSIS — J309 Allergic rhinitis, unspecified: Secondary | ICD-10-CM | POA: Diagnosis not present

## 2011-10-26 DIAGNOSIS — J449 Chronic obstructive pulmonary disease, unspecified: Secondary | ICD-10-CM | POA: Diagnosis not present

## 2011-10-26 DIAGNOSIS — K219 Gastro-esophageal reflux disease without esophagitis: Secondary | ICD-10-CM | POA: Diagnosis not present

## 2011-10-26 MED ORDER — ALBUTEROL SULFATE (2.5 MG/3ML) 0.083% IN NEBU: 2.5000 mg | INHALATION_SOLUTION | RESPIRATORY_TRACT | Status: DC | PRN

## 2011-10-26 MED ORDER — AZITHROMYCIN 250 MG PO TABS: ORAL_TABLET | ORAL | Status: AC

## 2011-10-26 MED ORDER — METHYLPREDNISOLONE ACETATE 80 MG/ML IJ SUSP
80.0000 mg | Freq: Once | INTRAMUSCULAR | Status: AC
  Administered 2011-10-26: 80 mg via INTRAMUSCULAR

## 2011-10-26 NOTE — Patient Instructions (Signed)
Script sent for Z pak antibiotic  Depo 80- steroid shot  Take an acid blocker twice daily- nexium in the morning, then an otc med like Pepcid in the evening  Use your nebulizer machine at home to help clear your chest.

## 2011-10-26 NOTE — Progress Notes (Signed)
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 He is coming to reestablish after moving back from [place] to [place]. History of asthma, COPD, obstructive sleep apnea complicated by DM, obesity, CAD/ischemic CM/sick sinus/bradycardia tachycardia with pacemaker, history of prostate cancer, anemia. He reports feeling stable today. New carpet smell in his apartment is irritating to his breathing but he denies routine cough or recent bronchitis. He has not felt the need to use his bronchodilators in 4 months. Walking distances limited more by degenerative joint hip pain. He can walk a maximum of 10 minutes on level ground and very little on hills or stairs. He denies blood, adenopathy, discolored sputum or swelling. He usually has trace ankle edema with no history of venous thromboembolic disease. CAD treated with CABG, stent and pacemaker but he has not had infarction. Did have pneumonia at age 67. He declines flu vaccine. Has had pneumonia vaccine. Long-term diagnosis of sleep apnea. NPSG 01/27/06-AHI 59.9 per hour. CPAP 12 plus oxygen 2 L for sleep, with good compliance and control. He will want to change to a local provider.  07/14/11- 78 year old male former smoker, Veteran,  History of asthma, COPD, obstructive sleep apnea complicated by DM, obesity, CAD/ischemic CM/sick sinus/bradycardia tachycardia with pacemaker, history of prostate cancer, anemia He had his first flu vaccine in 10 years this winter and avoided any significant respiratory infection. He no longer thinks he needs daytime oxygen. He is wearing it only at night, with his CPAP. Scheduled for right hip replacement in March. Chest x-ray 04/15/2011-no acute process, pacemaker PFT 04/23/2011-mild COPD  08/14/11-  78 year old male former smoker, Veteran,  History of asthma, COPD, obstructive sleep apnea complicated by DM, obesity, CAD/ischemic CM/sick sinus/bradycardia tachycardia/ pacemaker,  history of prostate cancer, anemia He is pending hip replacement by Dr. Shelle Iron on March 15. Asks prior authorization. I explained his increased risk of cardiopulmonary complications from any surgery and recognition that he is probably stable and near his best function now for necessary surgery. He is trying to move from his current apartment to a smoke free facility. A neighbor smokes heavily and the tobacco odor comes into the air conditioning ducts. He continues good compliance and symptom control using CPAP 12 with O2 2 L every night. He will use those settings while he is at the hospital for sleep and in recovery after extubation.  10/26/11- 78 year old male former smoker, Veteran,  History of asthma, COPD, obstructive sleep apnea complicated by DM, obesity, CAD/ischemic CM/sick sinus/bradycardia tachycardia/ pacemaker, history of prostate cancer, anemia Pt states having a productive cough  yellowish brown ,,increase sob wheezing ,chest congestion, cold chills .  Had right total hip replacement and finished physical therapy. Pain still limits walking. No respiratory complications. Now for 3 days had increased cough, yellow sputum, shortness of breath, wheeze, cold chills. Continued reflux symptoms on Nexium once daily. Discussion of steroid therapy and side effects.  ROS-see HPI Constitutional:   No-   weight loss, night sweats, fevers, +chills, fatigue, lassitude. HEENT:   No-  headaches, difficulty swallowing, tooth/dental problems, sore throat,       No-  sneezing, itching, ear ache, nasal congestion, post nasal drip,  CV:  No- recent  chest pain, orthopnea, PND. Mild chronic swelling in lower extremities,No- anasarca, dizziness, palpitations Resp: No- acute  shortness of breath with exertion or at rest.              +  productive cough,  + non-productive cough,  No- coughing up of blood.              +  change in color of mucus.  + wheezing.   Skin: No-   rash or lesions. GI:  No-    heartburn, indigestion, abdominal pain, nausea, vomiting,  GU:  MS: . Limiting hip pain Neuro-     nothing unusual Psych:  No- change in mood or affect. No depression or anxiety.  No memory loss.  OBJ General- Alert, Oriented, Affect-appropriate, Distress- none acute, overweight,  Skin- rash-none, lesions- none, excoriation- none Lymphadenopathy- none Head- atraumatic            Eyes- Gross vision intact, PERRLA, conjunctivae clear secretions            Ears- Hearing aids, HOH            Nose- Clear, no-Septal dev, mucus, polyps, erosion, perforation             Throat- Mallampati II , mucosa clear , drainage- none, tonsils- atrophic Neck- flexible , trachea midline, no stridor , thyroid nl, carotid no bruit Chest - symmetrical excursion , unlabored           Heart/CV- RRR with skipped beats , no murmur , no gallop  , no rub, nl s1 s2                           - JVD- none , edema- none, stasis changes- none, varices- none           Lung- basilar crackles, unlabored, wheeze- none, + loose cough , dullness-none, rub- none           Chest wall-  Abd- Br/ Gen/ Rectal- Not done, not indicated Extrem- cyanosis- none, clubbing, none, atrophy- none, strength- deconditioned, cane Neuro- grossly intact to observation

## 2011-10-30 NOTE — Assessment & Plan Note (Signed)
Substernal discomfort may be from GERD. He thinks he is feeling reflux at times despite Nexium once daily. Plan-discussion of acid reflux management. Try taking acid blocker twice daily before meals.

## 2011-10-30 NOTE — Assessment & Plan Note (Signed)
Acute exacerbation of COPD with bronchitis. Plan- Depo-Medrol, Z-Pak

## 2011-11-05 ENCOUNTER — Ambulatory Visit (INDEPENDENT_AMBULATORY_CARE_PROVIDER_SITE_OTHER)
Admission: RE | Admit: 2011-11-05 | Discharge: 2011-11-05 | Disposition: A | Discharge status: Home / Self Care | Payer: Medicare Other | Source: Ambulatory Visit | Attending: Internal Medicine | Admitting: Internal Medicine

## 2011-11-05 ENCOUNTER — Ambulatory Visit (INDEPENDENT_AMBULATORY_CARE_PROVIDER_SITE_OTHER): Payer: Medicare Other | Admitting: Internal Medicine

## 2011-11-05 ENCOUNTER — Encounter: Payer: Self-pay | Admitting: Internal Medicine

## 2011-11-05 VITALS — BP 136/76 | HR 59 | Ht 67.0 in | Wt 227.4 lb

## 2011-11-05 DIAGNOSIS — J441 Chronic obstructive pulmonary disease with (acute) exacerbation: Secondary | ICD-10-CM

## 2011-11-05 DIAGNOSIS — J449 Chronic obstructive pulmonary disease, unspecified: Secondary | ICD-10-CM

## 2011-11-05 DIAGNOSIS — R911 Solitary pulmonary nodule: Secondary | ICD-10-CM | POA: Diagnosis not present

## 2011-11-05 DIAGNOSIS — J40 Bronchitis, not specified as acute or chronic: Secondary | ICD-10-CM | POA: Diagnosis not present

## 2011-11-05 DIAGNOSIS — R0602 Shortness of breath: Secondary | ICD-10-CM | POA: Diagnosis not present

## 2011-11-05 MED ORDER — CLARITHROMYCIN 500 MG PO TABS: ORAL_TABLET | ORAL | Status: AC

## 2011-11-05 NOTE — Progress Notes (Signed)
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 He is coming to reestablish after moving back from [place] to [place]. History of asthma, COPD, obstructive sleep apnea complicated by DM, obesity, CAD/ischemic CM/sick sinus/bradycardia tachycardia with pacemaker, history of prostate cancer, anemia. He reports feeling stable today. New carpet smell in his apartment is irritating to his breathing but he denies routine cough or recent bronchitis. He has not felt the need to use his bronchodilators in 4 months. Walking distances limited more by degenerative joint hip pain. He can walk a maximum of 10 minutes on level ground and very little on hills or stairs. He denies blood, adenopathy, discolored sputum or swelling. He usually has trace ankle edema with no history of venous thromboembolic disease. CAD treated with CABG, stent and pacemaker but he has not had infarction. Did have pneumonia at age 78. He declines flu vaccine. Has had pneumonia vaccine. Long-term diagnosis of sleep apnea. NPSG 01/27/06-AHI 59.9 per hour. CPAP 12 plus oxygen 2 L for sleep, with good compliance and control. He will want to change to a local provider.  07/14/11- 78 year old male former smoker, Veteran,  History of asthma, COPD, obstructive sleep apnea complicated by DM, obesity, CAD/ischemic CM/sick sinus/bradycardia tachycardia with pacemaker, history of prostate cancer, anemia He had his first flu vaccine in 10 years this winter and avoided any significant respiratory infection. He no longer thinks he needs daytime oxygen. He is wearing it only at night, with his CPAP. Scheduled for right hip replacement in March. Chest x-ray 04/15/2011-no acute process, pacemaker PFT 04/23/2011-mild COPD  08/14/11-  78 year old male former smoker, Veteran,  History of asthma, COPD, obstructive sleep apnea complicated by DM, obesity, CAD/ischemic CM/sick sinus/bradycardia tachycardia/ pacemaker,  history of prostate cancer, anemia He is pending hip replacement by Dr. Shelle Iron on March 15. Asks prior authorization. I explained his increased risk of cardiopulmonary complications from any surgery and recognition that he is probably stable and near his best function now for necessary surgery. He is trying to move from his current apartment to a smoke free facility. A neighbor smokes heavily and the tobacco odor comes into the air conditioning ducts. He continues good compliance and symptom control using CPAP 12 with O2 2 L every night. He will use those settings while he is at the hospital for sleep and in recovery after extubation.  10/26/11- 78 year old male former smoker, Veteran,  History of asthma, COPD, obstructive sleep apnea complicated by DM, obesity, CAD/ischemic CM/sick sinus/bradycardia tachycardia/ pacemaker, history of prostate cancer, anemia Pt states having a productive cough  yellowish brown ,,increase sob wheezing ,chest congestion, cold chills .  Had right total hip replacement and finished physical therapy. Pain still limits walking. No respiratory complications. Now for 3 days had increased cough, yellow sputum, shortness of breath, wheeze, cold chills. Continued reflux symptoms on Nexium once daily. Discussion of steroid therapy and side effects.  11/05/11- 78 year old male former smoker, Veteran,  History of asthma, COPD, obstructive sleep apnea complicated by DM, obesity, CAD/ischemic CM/sick sinus/bradycardia tachycardia/ pacemaker, history of prostate cancer, anemia ACUTE VISIT: SOB and cough-productive yellow in color.Would like Zpak. Seen 10 days ago and Depo-Medrol and Z-Pak did help at that visit. Comfortable with CPAP. He takes it off to cough.  ROS-see HPI Constitutional:   No-   weight loss, night sweats, fevers, +chills, fatigue, lassitude. HEENT:   No-  headaches, difficulty swallowing, tooth/dental problems, sore throat,       No-  sneezing, itching, ear ache,  nasal congestion, post nasal drip,  CV:  No- recent  chest pain, orthopnea, PND. Mild chronic swelling in lower extremities,No- anasarca, dizziness, palpitations Resp: No- acute  shortness of breath with exertion or at rest.              +  productive cough,  + non-productive cough,  No- coughing up of blood.              +  change in color of mucus.  + wheezing.   Skin: No-   rash or lesions. GI:  No-   heartburn, indigestion, abdominal pain, nausea, vomiting,  GU:  MS: . Limiting hip pain Neuro-     nothing unusual Psych:  No- change in mood or affect. No depression or anxiety.  No memory loss.  OBJ General- Alert, Oriented, Affect-appropriate, Distress- none acute, overweight,  Skin- rash-none, lesions- none, excoriation- none Lymphadenopathy- none Head- atraumatic            Eyes- Gross vision intact, PERRLA, conjunctivae clear secretions            Ears- Hearing aids, HOH            Nose- Clear, no-Septal dev, mucus, polyps, erosion, perforation             Throat- Mallampati II , mucosa clear , drainage- none, tonsils- atrophic Neck- flexible , trachea midline, no stridor , thyroid nl, carotid no bruit Chest - symmetrical excursion , unlabored           Heart/CV- RRR with skipped beats , no murmur , no gallop  , no rub, nl s1 s2                           - JVD- none , edema- none, stasis changes- none, varices- none           Lung- basilar crackles persist, unlabored, wheeze- none, + loose cough , dullness-none, rub- none           Chest wall- pacemaker left chest Abd- Br/ Gen/ Rectal- Not done, not indicated Extrem- cyanosis- none, clubbing, none, atrophy- none, strength- deconditioned, cane Neuro- grossly intact to observation

## 2011-11-05 NOTE — Patient Instructions (Signed)
Script sent for biaxin/ clarithromycin antibiotic   Order CXR- dx COPD with exacerbation   Please let me know if you continue to have a problem.

## 2011-11-10 NOTE — Assessment & Plan Note (Signed)
Acute bronchitis with persistent lightly discolored sputum after Depo-Medrol and Z-Pak. Plan-Biaxin x10 days. Watch for fluid overload and consider when to repeat chest x-ray.

## 2011-11-11 ENCOUNTER — Ambulatory Visit: Payer: Medicare Other | Admitting: Internal Medicine

## 2011-11-23 DIAGNOSIS — R070 Pain in throat: Secondary | ICD-10-CM | POA: Diagnosis not present

## 2011-12-01 DIAGNOSIS — H01009 Unspecified blepharitis unspecified eye, unspecified eyelid: Secondary | ICD-10-CM | POA: Diagnosis not present

## 2011-12-01 DIAGNOSIS — Z961 Presence of intraocular lens: Secondary | ICD-10-CM | POA: Diagnosis not present

## 2011-12-01 DIAGNOSIS — H02059 Trichiasis without entropian unspecified eye, unspecified eyelid: Secondary | ICD-10-CM | POA: Diagnosis not present

## 2011-12-02 DIAGNOSIS — F411 Generalized anxiety disorder: Secondary | ICD-10-CM | POA: Diagnosis not present

## 2011-12-02 DIAGNOSIS — K219 Gastro-esophageal reflux disease without esophagitis: Secondary | ICD-10-CM | POA: Diagnosis not present

## 2011-12-02 DIAGNOSIS — R634 Abnormal weight loss: Secondary | ICD-10-CM | POA: Diagnosis not present

## 2011-12-07 ENCOUNTER — Telehealth: Payer: Self-pay | Admitting: Internal Medicine

## 2011-12-07 DIAGNOSIS — G4733 Obstructive sleep apnea (adult) (pediatric): Secondary | ICD-10-CM

## 2011-12-07 NOTE — Telephone Encounter (Signed)
I spoke with the pt and he states that he is getting bills from [facility] for his concentrator and he is being told that he owes this money because he did not qualify for usage. The pt states he has already paid 2 bills that should have been paid by medicare and that once this is all straightened out he wants to change DME companies. I have [id number] with lecretia at [phone] to discuss this pt issues.Carron Curie, CMA

## 2011-12-07 NOTE — Telephone Encounter (Signed)
Patient is calling back concerning the same as previous message.

## 2011-12-07 NOTE — Telephone Encounter (Signed)
I spoke with the pt and advised that we have LM with liason to figure out what is going on. Pt states understanding. Await Lecretias call. Carron Curie, CMA

## 2011-12-08 DIAGNOSIS — K2970 Gastritis, unspecified, without bleeding: Secondary | ICD-10-CM | POA: Diagnosis not present

## 2011-12-08 DIAGNOSIS — K319 Disease of stomach and duodenum, unspecified: Secondary | ICD-10-CM | POA: Diagnosis not present

## 2011-12-08 DIAGNOSIS — K449 Diaphragmatic hernia without obstruction or gangrene: Secondary | ICD-10-CM | POA: Diagnosis not present

## 2011-12-08 DIAGNOSIS — K2990 Gastroduodenitis, unspecified, without bleeding: Secondary | ICD-10-CM | POA: Diagnosis not present

## 2011-12-08 DIAGNOSIS — K219 Gastro-esophageal reflux disease without esophagitis: Secondary | ICD-10-CM | POA: Diagnosis not present

## 2011-12-09 NOTE — Telephone Encounter (Signed)
Called Lecretia - [email or web address]

## 2011-12-09 NOTE — Telephone Encounter (Signed)
Spoke with [place].  States Shanda Bumps at the [facility] office is working on this.  Mayra Reel states she will check on the status and call us back.

## 2011-12-11 NOTE — Telephone Encounter (Signed)
CY--please advise if ok to reorder the ONO and we will need ov time within 30 days of 02 set up,.  Please advise. thanks

## 2011-12-11 NOTE — Telephone Encounter (Signed)
Spoke with pt and scheduled appt with CDY for 01/04/12 and order was sent to [facility] for ONO.

## 2011-12-11 NOTE — Telephone Encounter (Signed)
Ok to reorder. Thanks!

## 2011-12-11 NOTE — Telephone Encounter (Signed)
Problem was ono did not qualify pt for 02 would like to repeat ono and will need order and pt will need ov within 30days of 02 set up [employer]

## 2011-12-11 NOTE — Telephone Encounter (Signed)
Please advise PCC's, Eric [name] was suppose to be checking on this

## 2011-12-23 DIAGNOSIS — Z8546 Personal history of malignant neoplasm of prostate: Secondary | ICD-10-CM | POA: Diagnosis not present

## 2011-12-25 ENCOUNTER — Encounter: Payer: Self-pay | Admitting: Internal Medicine

## 2011-12-25 ENCOUNTER — Ambulatory Visit (INDEPENDENT_AMBULATORY_CARE_PROVIDER_SITE_OTHER): Payer: Medicare Other | Admitting: Internal Medicine

## 2011-12-25 VITALS — BP 136/60 | HR 60 | Temp 97.1°F | Ht 68.0 in | Wt 231.4 lb

## 2011-12-25 DIAGNOSIS — K449 Diaphragmatic hernia without obstruction or gangrene: Secondary | ICD-10-CM | POA: Diagnosis not present

## 2011-12-25 DIAGNOSIS — F419 Anxiety disorder, unspecified: Secondary | ICD-10-CM

## 2011-12-25 DIAGNOSIS — S61209A Unspecified open wound of unspecified finger without damage to nail, initial encounter: Secondary | ICD-10-CM

## 2011-12-25 DIAGNOSIS — F411 Generalized anxiety disorder: Secondary | ICD-10-CM | POA: Diagnosis not present

## 2011-12-25 DIAGNOSIS — S61219A Laceration without foreign body of unspecified finger without damage to nail, initial encounter: Secondary | ICD-10-CM

## 2011-12-25 MED ORDER — ALPRAZOLAM 0.5 MG PO TABS: 0.5000 mg | ORAL_TABLET | Freq: Three times a day (TID) | ORAL | Status: DC | PRN

## 2011-12-25 MED ORDER — SERTRALINE HCL 25 MG PO TABS: 25.0000 mg | ORAL_TABLET | Freq: Every day | ORAL | Status: DC

## 2011-12-25 NOTE — Patient Instructions (Addendum)
It was good to see you today. Your Tetanus shot is up to date Keep little finger cut covered with antibiotic ointment and Band-Aid everyday x 1 week, then only if needed - ok to remove to wash/shower For your "anger spells", start sertraline every day - may take 6 weeks to see full effect/benefit of this medication Also use alprazolam if needed for spells of anger/anxety while waiting for sertraline to take effect Please keep scheduled followup as planned for recheck, call sooner if problems.

## 2011-12-25 NOTE — Assessment & Plan Note (Signed)
Hx same 01/2011 precipitated by move to [place] and senior appt complex- resume prn xanax to help with panic attacks and start low dose sertraline Risks and benefits discussed including prn, and scheduled use of meds - pt understands and agrees We also reviewed potential risk/benefit and possible side effects - pt understands and agrees to same  Verified no SI/HI follow up 6-8 weeks to review, sooner if problems

## 2011-12-25 NOTE — Progress Notes (Signed)
Subjective:    Patient ID: Eric [name], male    DOB: March 28, 1927, 78 y.o.   MRN: [id number]  HPI complains of cut on R 5th finger Occurred last PM while opening cat food No pain, swelling or numbness - bleeding controlled with pressure, none this AM  Also review recent EGD and PPI trials  Concerned with "mad spells", and uncontrolled anger - Spells cause a "head-flush sensation" and increase in acid/indigestion Hx same last summer, precipitated by move to [facility] senior apt complex Now spells precipitated by traffic/driving and neighbors Denies SI/HI "short fuse" but no violent tendency or fights  Past Medical History  Diagnosis Date  . LACTOSE INTOLERANCE   . OBESITY   . CARDIOMYOPATHY, ISCHEMIC   . AORTIC SCLEROSIS   . SICK SINUS/ TACHY-BRADY SYNDROME 09/2007    s/p PPM st judes  . PERIPHERAL VASCULAR DISEASE   . CAROTID BRUIT, RIGHT 02/27/2008  . IBS (irritable bowel syndrome)   . ALLERGIC RHINITIS   . ANEMIA-NOS   . OA (osteoarthritis)   . COPD   . GERD   . HYPERLIPIDEMIA   . HIATAL HERNIA   . Diverticulosis   . Prostate cancer     seed implants 2004  . DIABETES MELLITUS-TYPE II     diet controlled  . CORONARY ARTERY DISEASE     CABG 1995, PTCA/DES 2008, 2009 and 08/2010  . HYPERTENSION   . Asthma   . Sleep apnea     Review of Systems  Constitutional: Negative for fever, appetite change, fatigue and unexpected weight change.  Respiratory: Negative for cough and shortness of breath.   Cardiovascular: Negative for chest pain.  Psychiatric/Behavioral: Positive for decreased concentration and agitation. Negative for suicidal ideas, hallucinations, behavioral problems, confusion, disturbed wake/sleep cycle and dysphoric mood. The patient is nervous/anxious. The patient is not hyperactive.        Objective:   Physical Exam BP 136/60  Pulse 60  Temp 97.1 F (36.2 C) (Temporal)  Ht 5\' 8"  (1.727 m)  Wt 231 lb 6.4 oz (104.962 kg)  BMI 35.18 kg/m2  SpO2 95% Wt  Readings from Last 3 Encounters:  12/25/11 231 lb 6.4 oz (104.962 kg)  11/05/11 227 lb 6.4 oz (103.148 kg)  10/26/11 231 lb 3.2 oz (104.872 kg)   Constitutional:  Overweight. appears well-developed and well-nourished. No distress at rest- Neck: thick and short, Normal range of motion. Neck supple. No JVD present. No thyromegaly present.  Cardiovascular: Normal rate, regular rhythm and normal heart sounds.  No murmur heard. trace BLE edema Pulmonary/Chest: Effort normal and breath sounds normal. No respiratory distress. no wheezes.  Neurological: he is alert and oriented to person, place, and time. No cranial nerve deficit. Coordination normal.  Skin: 1cm small laceration lateral (outside) edge R 5th finger, no drainage or erythema Psychiatric: he has an aggitated mood and irritable affect, but behavior is normal. Judgment and thought content normal.   Lab Results  Component Value Date   WBC 4.6 10/05/2011   HGB 12.0* 10/05/2011   HCT 36.2* 10/05/2011   PLT 183.0 10/05/2011   GLUCOSE 152* 08/25/2011   CHOL 100 10/23/2010   TRIG 109.0 10/23/2010   HDL 33.80* 10/23/2010   LDLCALC 44 10/23/2010   ALT 15 08/13/2011   AST 19 08/13/2011   NA 131* 08/25/2011   K 4.3 08/25/2011   CL 99 08/25/2011   CREATININE 0.88 08/25/2011   BUN 21 08/25/2011   CO2 28 08/25/2011   TSH 1.19 04/28/2010  PSA 0.02* 09/10/2006   INR 1.00 08/13/2011   HGBA1C 6.8* 04/23/2011   MICROALBUR 0.5 04/18/2009       Assessment & Plan:  R 5th little finger laceration - reassurance provided, instructed on wound care and immunizations are up to date  Also see problem list. Medications and labs reviewed today.

## 2011-12-28 DIAGNOSIS — Z87442 Personal history of urinary calculi: Secondary | ICD-10-CM | POA: Diagnosis not present

## 2011-12-28 DIAGNOSIS — Z8546 Personal history of malignant neoplasm of prostate: Secondary | ICD-10-CM | POA: Diagnosis not present

## 2011-12-28 DIAGNOSIS — N529 Male erectile dysfunction, unspecified: Secondary | ICD-10-CM | POA: Diagnosis not present

## 2011-12-28 DIAGNOSIS — M169 Osteoarthritis of hip, unspecified: Secondary | ICD-10-CM | POA: Diagnosis not present

## 2011-12-30 ENCOUNTER — Telehealth: Payer: Self-pay | Admitting: Internal Medicine

## 2011-12-30 DIAGNOSIS — G4733 Obstructive sleep apnea (adult) (pediatric): Secondary | ICD-10-CM

## 2011-12-30 NOTE — Telephone Encounter (Signed)
I don't see that report yet.

## 2011-12-30 NOTE — Telephone Encounter (Signed)
I spoke with pt and he states he had his ONO done last week. He is requesting these results. Please advise Dr. Maple Hudson thanks

## 2011-12-31 NOTE — Telephone Encounter (Signed)
Eric [name] calling with info for previous msg can be reached at [phone].Eric [name]

## 2011-12-31 NOTE — Telephone Encounter (Signed)
Called [facility] for report .

## 2011-12-31 NOTE — Telephone Encounter (Signed)
SPOKE WITH DARRIAN AN [facility] FAXING REPORT

## 2012-01-04 ENCOUNTER — Ambulatory Visit (INDEPENDENT_AMBULATORY_CARE_PROVIDER_SITE_OTHER): Payer: Medicare Other | Admitting: Internal Medicine

## 2012-01-04 ENCOUNTER — Encounter: Payer: Self-pay | Admitting: Internal Medicine

## 2012-01-04 VITALS — BP 118/66 | HR 60 | Ht 67.0 in | Wt 239.4 lb

## 2012-01-04 DIAGNOSIS — G4733 Obstructive sleep apnea (adult) (pediatric): Secondary | ICD-10-CM | POA: Diagnosis not present

## 2012-01-04 DIAGNOSIS — K449 Diaphragmatic hernia without obstruction or gangrene: Secondary | ICD-10-CM | POA: Diagnosis not present

## 2012-01-04 DIAGNOSIS — J449 Chronic obstructive pulmonary disease, unspecified: Secondary | ICD-10-CM | POA: Diagnosis not present

## 2012-01-04 DIAGNOSIS — K219 Gastro-esophageal reflux disease without esophagitis: Secondary | ICD-10-CM | POA: Diagnosis not present

## 2012-01-04 NOTE — Telephone Encounter (Signed)
Dr. Delena Bali, pls advise if you have seen these results yet.  Thank you.

## 2012-01-04 NOTE — Progress Notes (Signed)
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 He is coming to reestablish after moving back from [place] to [place]. History of asthma, COPD, obstructive sleep apnea complicated by DM, obesity, CAD/ischemic CM/sick sinus/bradycardia tachycardia with pacemaker, history of prostate cancer, anemia. He reports feeling stable today. New carpet smell in his apartment is irritating to his breathing but he denies routine cough or recent bronchitis. He has not felt the need to use his bronchodilators in 4 months. Walking distances limited more by degenerative joint hip pain. He can walk a maximum of 10 minutes on level ground and very little on hills or stairs. He denies blood, adenopathy, discolored sputum or swelling. He usually has trace ankle edema with no history of venous thromboembolic disease. CAD treated with CABG, stent and pacemaker but he has not had infarction. Did have pneumonia at age 62. He declines flu vaccine. Has had pneumonia vaccine. Long-term diagnosis of sleep apnea. NPSG 01/27/06-AHI 59.9 per hour. CPAP 12 plus oxygen 2 L for sleep, with good compliance and control. He will want to change to a local provider.  07/14/11- 78 year old male former smoker, Veteran,  History of asthma, COPD, obstructive sleep apnea complicated by DM, obesity, CAD/ischemic CM/sick sinus/bradycardia tachycardia with pacemaker, history of prostate cancer, anemia He had his first flu vaccine in 10 years this winter and avoided any significant respiratory infection. He no longer thinks he needs daytime oxygen. He is wearing it only at night, with his CPAP. Scheduled for right hip replacement in March. Chest x-ray 04/15/2011-no acute process, pacemaker PFT 04/23/2011-mild COPD  08/14/11-  78 year old male former smoker, Veteran,  History of asthma, COPD, obstructive sleep apnea complicated by DM, obesity, CAD/ischemic CM/sick sinus/bradycardia tachycardia/ pacemaker,  history of prostate cancer, anemia He is pending hip replacement by Dr. Tonita Cong on March 15. Asks prior authorization. I explained his increased risk of cardiopulmonary complications from any surgery and recognition that he is probably stable and near his best function now for necessary surgery. He is trying to move from his current apartment to a smoke free facility. A neighbor smokes heavily and the tobacco odor comes into the air conditioning ducts. He continues good compliance and symptom control using CPAP 12 with O2 2 L every night. He will use those settings while he is at the hospital for sleep and in recovery after extubation.  10/26/11- 78 year old male former smoker, Veteran,  History of asthma, COPD, obstructive sleep apnea complicated by DM, obesity, CAD/ischemic CM/sick sinus/bradycardia tachycardia/ pacemaker, history of prostate cancer, anemia Pt states having a productive cough  yellowish brown ,,increase sob wheezing ,chest congestion, cold chills .  Had right total hip replacement and finished physical therapy. Pain still limits walking. No respiratory complications. Now for 3 days had increased cough, yellow sputum, shortness of breath, wheeze, cold chills. Continued reflux symptoms on Nexium once daily. Discussion of steroid therapy and side effects.  11/05/11- 78 year old male former smoker, Veteran,  History of asthma, COPD, obstructive sleep apnea complicated by DM, obesity, CAD/ischemic CM/sick sinus/bradycardia tachycardia/ pacemaker, history of prostate cancer, anemia ACUTE VISIT: SOB and cough-productive yellow in color.Would like Zpak. Seen 10 days ago and Depo-Medrol and Z-Pak did help at that visit. Comfortable with CPAP. He takes it off to cough.  01/04/12- 78 year old male former smoker, Veteran,  History of asthma, COPD, obstructive sleep apnea complicated by DM, obesity, CAD/ischemic CM/sick sinus/bradycardia tachycardia/ pacemaker, history of prostate cancer,  anemia. Needs recert for O2 at night per  [facility]; pt states he feels like he needs O2 during the day as well. He says he sleeps and feels better when  using oxygen. Continue CPAP 12/Advanced, with oxygen at 2 L currently. COPD assessment test (CAT) 31/40.   ROS-see HPI Constitutional:   No-   weight loss, night sweats, fevers, +chills, fatigue, lassitude. HEENT:   No-  headaches, difficulty swallowing, tooth/dental problems, sore throat,       No-  sneezing, itching, ear ache, nasal congestion, post nasal drip,  CV:  No- recent  chest pain, orthopnea, PND. Mild chronic swelling in lower extremities, No- anasarca, dizziness, palpitations Resp: No- acute  shortness of breath with exertion or at rest.              +  productive cough,  + non-productive cough,  No- coughing up of blood.              +  change in color of mucus.  + wheezing.   Skin: No-   rash or lesions. GI:  No-   heartburn, indigestion, abdominal pain, nausea, vomiting,  GU:  MS: . Limiting hip pain Neuro-     nothing unusual Psych:  No- change in mood or affect. No depression or anxiety.  No memory loss.  OBJ General- Alert, Oriented, Affect-appropriate, Distress- none acute, overweight,  Skin- rash-none, lesions- none, excoriation- none Lymphadenopathy- none Head- atraumatic            Eyes- Gross vision intact, PERRLA, conjunctivae clear secretions            Ears- Hearing aids, HOH            Nose- Clear, no-Septal dev, mucus, polyps, erosion, perforation             Throat- Mallampati II , mucosa clear , drainage- none, tonsils- atrophic Neck- flexible , trachea midline, no stridor , thyroid nl, carotid no bruit Chest - symmetrical excursion , unlabored           Heart/CV- RRR with skipped beats , no murmur , no gallop  , no rub, nl s1 s2                           - JVD- none , edema- none, stasis changes- none, varices- none           Lung- +faint crackles persist, unlabored, wheeze- none,  dullness-none, rub-  none           Chest wall- pacemaker left chest Abd- Br/ Gen/ Rectal- Not done, not indicated Extrem- cyanosis- none, clubbing, none, atrophy- none, strength- deconditioned, cane Neuro- grossly intact to observation

## 2012-01-04 NOTE — Patient Instructions (Addendum)
Order- schedule  Dx COPD

## 2012-01-05 ENCOUNTER — Ambulatory Visit (INDEPENDENT_AMBULATORY_CARE_PROVIDER_SITE_OTHER): Payer: Medicare Other | Admitting: Internal Medicine

## 2012-01-05 DIAGNOSIS — J449 Chronic obstructive pulmonary disease, unspecified: Secondary | ICD-10-CM

## 2012-01-06 NOTE — Telephone Encounter (Signed)
Overnight oximetry showed adequate oxygen saturation on CPAP with room air

## 2012-01-07 NOTE — Telephone Encounter (Signed)
Per CY-okay to D/C home O2.

## 2012-01-07 NOTE — Telephone Encounter (Signed)
Spoke with pt and notified that order was sent to d/c o2. Pt verbalized understanding and states nothing further needed.

## 2012-01-07 NOTE — Telephone Encounter (Signed)
Pt return call to triage.  °

## 2012-01-07 NOTE — Telephone Encounter (Signed)
Order placed -- lmomtcb to inform pt. 

## 2012-01-07 NOTE — Telephone Encounter (Signed)
Called, spoke with pt.  I informed him of ONO results per Dr. Maple Hudson.  Pt verbalized understanding of this and would like to know if we can have DME pick up o2 concentrator now. Dr. Maple Hudson, are you ok with sending order to DME to d/t o2 concentrator now?  Pls advise.  Thank you.

## 2012-01-10 DIAGNOSIS — I495 Sick sinus syndrome: Secondary | ICD-10-CM | POA: Diagnosis not present

## 2012-01-10 NOTE — Assessment & Plan Note (Signed)
Good compliance with CPAP and good control. We need to see if he still needs the oxygen.

## 2012-01-10 NOTE — Assessment & Plan Note (Signed)
Plan-overnight oximetry with COPD on room air and exercise assessment for oxygen need.

## 2012-01-11 DIAGNOSIS — E1149 Type 2 diabetes mellitus with other diabetic neurological complication: Secondary | ICD-10-CM | POA: Diagnosis not present

## 2012-01-11 DIAGNOSIS — Z950 Presence of cardiac pacemaker: Secondary | ICD-10-CM | POA: Diagnosis not present

## 2012-01-11 DIAGNOSIS — E669 Obesity, unspecified: Secondary | ICD-10-CM | POA: Diagnosis not present

## 2012-01-11 DIAGNOSIS — E1142 Type 2 diabetes mellitus with diabetic polyneuropathy: Secondary | ICD-10-CM | POA: Diagnosis not present

## 2012-01-11 NOTE — Progress Notes (Signed)
Documentation for 6 minute walk test 

## 2012-01-13 DIAGNOSIS — R0602 Shortness of breath: Secondary | ICD-10-CM | POA: Diagnosis not present

## 2012-01-14 ENCOUNTER — Encounter: Payer: Self-pay | Admitting: Internal Medicine

## 2012-01-14 ENCOUNTER — Telehealth: Payer: Self-pay | Admitting: *Deleted

## 2012-01-14 NOTE — Telephone Encounter (Signed)
Message copied by Ronny Bacon on Thu Jan 14, 2012 10:04 AM ------      Message from: Isabel, Joni Fears D      Created: Mon Jan 11, 2012  9:57 PM      Regarding: [place], Beem       Reviewed from 7/30. I am sure I was told he didn't desaturate, so his response was to ask O2 be dc'd to cut expense.       Wires crossed somewhere, because he does desaturate with exertion.

## 2012-01-14 NOTE — Telephone Encounter (Signed)
Spoke with patient-aware that he needs to keep O2 and that he can not have portable only at home without concentrator. Pt understands and states he has concentrator at home and will continue to use as told so.

## 2012-01-14 NOTE — Telephone Encounter (Signed)
LMTCB-ask for me ONLY!

## 2012-01-20 ENCOUNTER — Telehealth: Payer: Self-pay | Admitting: Internal Medicine

## 2012-01-21 NOTE — Telephone Encounter (Signed)
Please create letter: sertraline is daily med for anxiety control and alprazolam as needed to control breakthru anxiety or insomnia symptoms uncontrolled by sertraline - both are medically indicated for his anxiety

## 2012-01-25 ENCOUNTER — Telehealth: Payer: Self-pay | Admitting: Internal Medicine

## 2012-01-25 NOTE — Telephone Encounter (Signed)
Called pt back to get more information. He states on his visit from 719/13 they d/c his Norvasc and he is still suppose to be taking med. Not sure why that was discontinued suppose to be taking 0.5mg  daily. [facility] that [facility] will [facility]. Requesting a letter to be sent o Dr. Paula Compton stating he is currently taking norvasc. Fax letter to [phone]... 01/25/12@3 :13pm/LMB

## 2012-01-25 NOTE — Telephone Encounter (Signed)
Letter generated. Notified pt ready for pick-up... 01/25/12@8 :59am/LMB

## 2012-01-26 ENCOUNTER — Other Ambulatory Visit: Payer: Self-pay | Admitting: Internal Medicine

## 2012-01-26 DIAGNOSIS — J449 Chronic obstructive pulmonary disease, unspecified: Secondary | ICD-10-CM

## 2012-02-01 ENCOUNTER — Encounter: Payer: Self-pay | Admitting: Internal Medicine

## 2012-02-04 ENCOUNTER — Ambulatory Visit (INDEPENDENT_AMBULATORY_CARE_PROVIDER_SITE_OTHER): Payer: Medicare Other | Admitting: Internal Medicine

## 2012-02-04 ENCOUNTER — Encounter: Payer: Self-pay | Admitting: Internal Medicine

## 2012-02-04 VITALS — BP 130/60 | HR 69 | Temp 98.1°F | Ht 67.0 in | Wt 234.1 lb

## 2012-02-04 DIAGNOSIS — J449 Chronic obstructive pulmonary disease, unspecified: Secondary | ICD-10-CM | POA: Diagnosis not present

## 2012-02-04 DIAGNOSIS — K219 Gastro-esophageal reflux disease without esophagitis: Secondary | ICD-10-CM | POA: Diagnosis not present

## 2012-02-04 DIAGNOSIS — I10 Essential (primary) hypertension: Secondary | ICD-10-CM | POA: Diagnosis not present

## 2012-02-04 DIAGNOSIS — F419 Anxiety disorder, unspecified: Secondary | ICD-10-CM

## 2012-02-04 DIAGNOSIS — F411 Generalized anxiety disorder: Secondary | ICD-10-CM | POA: Diagnosis not present

## 2012-02-04 NOTE — Assessment & Plan Note (Signed)
Hx same 01/2011 precipitated by move to [place] and senior appt complex- on prn xanax to help with panic attacks and started low dose sertraline 12/2011 Improved, no anger spells or outbursts in past 4 weeks The current medical regimen is effective;  continue present plan and medications.  Again risks and benefits discussed including prn use vs and scheduled use of meds - pt understands and agrees Verified no SI/HI

## 2012-02-04 NOTE — Assessment & Plan Note (Signed)
Now on 24/7 O2 since 01/2012 - previously QHS with CPAP only Pt frustrated with daytime use and ?if necessary - will defer to pulm Encouraged compliance with rx'd treatments as onging

## 2012-02-04 NOTE — Progress Notes (Signed)
Subjective:    Patient ID: Eric [name], male    DOB: Aug 20, 1926, 78 y.o.   MRN: [id number]  HPI  Here for follow up -reviewed chronic medical issues:  CAD, hx CABG and multiple PTCA/DES - hosp 08/2010 reviewed with resolution of shoulder pain following DES. Follows with local cards ([facility]) - no residual CP/neck or shoulder symptoms  - the patient reports compliance with medication(s) as prescribed. Denies adverse side effects.  chronic pain -low back pain and DDD -hx falls early 2011 but none since starting steroid shots (ramos) Also R hip DJD, s/p injection and THA 08/2011 - much improved Down to 2-3 pain pills. day  COPD - frustrated by 24/7 O2 requirements since 12/2011 - previously QHS only with OSA  DM2, diet controlled -  Previously stopped Actos due to fluid retention and rash- Prev followed with endo, kerr q56mo- but off all meds including metformin so follows DM here checks sugars infreq - only goes over 120 if eat something sweet   HTN - reports compliance with ongoing medical treatment and no changes in medication dose or frequency. denies adverse side effects related to current therapy.   dyslipidemia - on statin - reports compliance with ongoing medical treatment and no changes in medication dose or frequency. denies adverse side effects related to current therapy.   GERD - reports compliance with ongoing medical treatment and no changes in medication dose or frequency. denies adverse side effects related to current therapy.   Anxiety -"nerves" started low dose prn xanax rx 01/2011 associated with "mad spells" and anger flares - Spells cause a "head-flush sensation" and increase in acid/indigestion Hx same summer 2012, precipitated by move to [facility] senior apt complex Summer 2013 increase symptoms: precipitated by traffic/driving and neighbors "short fuse" but no violent tendency or fights Started ssri 12/2011 for same - feels improved - no more anger spells- denies side  effects    Past Medical History  Diagnosis Date  . LACTOSE INTOLERANCE   . OBESITY   . CARDIOMYOPATHY, ISCHEMIC   . AORTIC SCLEROSIS   . SICK SINUS/ TACHY-BRADY SYNDROME 09/2007    s/p PPM st judes  . PERIPHERAL VASCULAR DISEASE   . CAROTID BRUIT, RIGHT 02/27/2008  . IBS (irritable bowel syndrome)   . ALLERGIC RHINITIS   . ANEMIA-NOS   . OA (osteoarthritis)   . COPD   . GERD   . HYPERLIPIDEMIA   . HIATAL HERNIA   . Diverticulosis   . Prostate cancer     seed implants 2004  . DIABETES MELLITUS-TYPE II     diet controlled  . CORONARY ARTERY DISEASE     CABG 1995, PTCA/DES 2008, 2009 and 08/2010  . HYPERTENSION   . Asthma   . Sleep apnea     Review of Systems  Constitutional: Negative for fever and fatigue.  Respiratory: Negative for cough, shortness of breath and wheezing.   Cardiovascular: Negative for chest pain, palpitations and leg swelling.      Objective:   Physical Exam  BP 130/60  Pulse 69  Temp 98.1 F (36.7 C) (Oral)  Ht 5\' 7"  (1.702 m)  Wt 234 lb 1.9 oz (106.196 kg)  BMI 36.67 kg/m2  SpO2 95% Wt Readings from Last 3 Encounters:  02/04/12 234 lb 1.9 oz (106.196 kg)  01/04/12 239 lb 6.4 oz (108.591 kg)  12/25/11 231 lb 6.4 oz (104.962 kg)   Constitutional:  Overweight, but appears well-developed and well-nourished. No distress at rest- ambulates with cane asst.  Neck: thick and short, Normal range of motion. Neck supple. No JVD present. No thyromegaly present.  Cardiovascular: Normal rate, regular rhythm and normal heart sounds.  No murmur heard. trace BLE edema Pulmonary/Chest: Effort normal and breath sounds normal. No respiratory distress. no wheezes.  Neurological: he is alert and oriented to person, place, and time. No cranial nerve deficit. Coordination normal.  Psychiatric: he has a mildly anxious/nervous mood and affect. behavior is normal. Judgment and thought content normal.      Lab Results  Component Value Date   WBC 4.6 10/05/2011    HGB 12.0* 10/05/2011   HCT 36.2* 10/05/2011   PLT 183.0 10/05/2011   CHOL 100 10/23/2010   TRIG 109.0 10/23/2010   HDL 33.80* 10/23/2010   ALT 15 08/13/2011   AST 19 08/13/2011   NA 131* 08/25/2011   K 4.3 08/25/2011   CL 99 08/25/2011   CREATININE 0.88 08/25/2011   BUN 21 08/25/2011   CO2 28 08/25/2011   TSH 1.19 04/28/2010   PSA 0.02* 09/10/2006   INR 1.00 08/13/2011   HGBA1C 6.8* 04/23/2011   MICROALBUR 0.5 04/18/2009    Assessment & Plan:  See problem list. Medications and labs reviewed today.

## 2012-02-04 NOTE — Patient Instructions (Addendum)
It was good to see you today. You are doing very well with your anger control - continue sertraline every day for this Also use alprazolam if needed for "breakthrough" spells of anger/anxiety  Please schedule followup in 3-4 months, call sooner if problems.

## 2012-02-04 NOTE — Assessment & Plan Note (Signed)
BP Readings from Last 3 Encounters:  02/04/12 130/60  01/04/12 118/66  12/25/11 136/60   The current medical regimen is effective;  continue present plan and medications.

## 2012-02-04 NOTE — Assessment & Plan Note (Signed)
Prefers BID Nexium to control reflux - The current medical regimen is effective;  continue present plan and medications.

## 2012-02-10 DIAGNOSIS — Z950 Presence of cardiac pacemaker: Secondary | ICD-10-CM | POA: Diagnosis not present

## 2012-02-11 ENCOUNTER — Telehealth: Payer: Self-pay | Admitting: *Deleted

## 2012-02-11 NOTE — Telephone Encounter (Signed)
Left msg on triage stating [facility] never received letter concerning his medication. Requesting call back...Eric [name]

## 2012-02-11 NOTE — Telephone Encounter (Signed)
Called pt back no answer LMOM we did fax letter to [facility] twice both time letter went through. Not sure what the problem why [facility] is not getting letter, but will leave copy of letter up front so pt can take to [place]...Raechel Chute

## 2012-02-15 ENCOUNTER — Other Ambulatory Visit: Payer: Self-pay | Admitting: *Deleted

## 2012-02-15 MED ORDER — ALPRAZOLAM 0.5 MG PO TABS: 0.5000 mg | ORAL_TABLET | Freq: Three times a day (TID) | ORAL | Status: DC | PRN

## 2012-02-15 MED ORDER — SERTRALINE HCL 25 MG PO TABS: 25.0000 mg | ORAL_TABLET | Freq: Every day | ORAL | Status: DC

## 2012-02-15 NOTE — Telephone Encounter (Signed)
Faxed rx's to [facility].../lmb

## 2012-02-15 NOTE — Telephone Encounter (Signed)
Left msg on vm stating pt is wanting to have rx's filled there. Needing new rx's on his sertraline & xanax 90 day fax to [phone]...Raechel Chute

## 2012-02-15 NOTE — Addendum Note (Signed)
Addended by: Deatra James on: 02/15/2012 01:45 PM   Modules accepted: Orders

## 2012-02-26 ENCOUNTER — Telehealth: Payer: Self-pay | Admitting: Internal Medicine

## 2012-02-26 NOTE — Telephone Encounter (Signed)
Forward 122 pages (front & back) [facility] to [facility] Rene Paci for review on 02-26-12 ym

## 2012-02-29 ENCOUNTER — Ambulatory Visit: Payer: Medicare Other | Admitting: Internal Medicine

## 2012-03-01 ENCOUNTER — Ambulatory Visit (INDEPENDENT_AMBULATORY_CARE_PROVIDER_SITE_OTHER): Payer: Medicare Other | Admitting: General Practice

## 2012-03-01 DIAGNOSIS — Z23 Encounter for immunization: Secondary | ICD-10-CM

## 2012-03-07 ENCOUNTER — Encounter: Payer: Self-pay | Admitting: Internal Medicine

## 2012-03-07 ENCOUNTER — Ambulatory Visit (INDEPENDENT_AMBULATORY_CARE_PROVIDER_SITE_OTHER): Payer: Medicare Other | Admitting: Internal Medicine

## 2012-03-07 VITALS — BP 122/76 | HR 67 | Ht 67.0 in | Wt 237.8 lb

## 2012-03-07 DIAGNOSIS — G4733 Obstructive sleep apnea (adult) (pediatric): Secondary | ICD-10-CM

## 2012-03-07 DIAGNOSIS — J449 Chronic obstructive pulmonary disease, unspecified: Secondary | ICD-10-CM

## 2012-03-07 NOTE — Progress Notes (Signed)
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 He is coming to reestablish after moving back from [place] to [place]. History of asthma, COPD, obstructive sleep apnea complicated by DM, obesity, CAD/ischemic CM/sick sinus/bradycardia tachycardia with pacemaker, history of prostate cancer, anemia. He reports feeling stable today. New carpet smell in his apartment is irritating to his breathing but he denies routine cough or recent bronchitis. He has not felt the need to use his bronchodilators in 4 months. Walking distances limited more by degenerative joint hip pain. He can walk a maximum of 10 minutes on level ground and very little on hills or stairs. He denies blood, adenopathy, discolored sputum or swelling. He usually has trace ankle edema with no history of venous thromboembolic disease. CAD treated with CABG, stent and pacemaker but he has not had infarction. Did have pneumonia at age 62. He declines flu vaccine. Has had pneumonia vaccine. Long-term diagnosis of sleep apnea. NPSG 01/27/06-AHI 59.9 per hour. CPAP 12 plus oxygen 2 L for sleep, with good compliance and control. He will want to change to a local provider.  07/14/11- 78 year old male former smoker, Veteran,  History of asthma, COPD, obstructive sleep apnea complicated by DM, obesity, CAD/ischemic CM/sick sinus/bradycardia tachycardia with pacemaker, history of prostate cancer, anemia He had his first flu vaccine in 10 years this winter and avoided any significant respiratory infection. He no longer thinks he needs daytime oxygen. He is wearing it only at night, with his CPAP. Scheduled for right hip replacement in March. Chest x-ray 04/15/2011-no acute process, pacemaker PFT 04/23/2011-mild COPD  08/14/11-  78 year old male former smoker, Veteran,  History of asthma, COPD, obstructive sleep apnea complicated by DM, obesity, CAD/ischemic CM/sick sinus/bradycardia tachycardia/ pacemaker,  history of prostate cancer, anemia He is pending hip replacement by Dr. Tonita Cong on March 15. Asks prior authorization. I explained his increased risk of cardiopulmonary complications from any surgery and recognition that he is probably stable and near his best function now for necessary surgery. He is trying to move from his current apartment to a smoke free facility. A neighbor smokes heavily and the tobacco odor comes into the air conditioning ducts. He continues good compliance and symptom control using CPAP 12 with O2 2 L every night. He will use those settings while he is at the hospital for sleep and in recovery after extubation.  10/26/11- 78 year old male former smoker, Veteran,  History of asthma, COPD, obstructive sleep apnea complicated by DM, obesity, CAD/ischemic CM/sick sinus/bradycardia tachycardia/ pacemaker, history of prostate cancer, anemia Pt states having a productive cough  yellowish brown ,,increase sob wheezing ,chest congestion, cold chills .  Had right total hip replacement and finished physical therapy. Pain still limits walking. No respiratory complications. Now for 3 days had increased cough, yellow sputum, shortness of breath, wheeze, cold chills. Continued reflux symptoms on Nexium once daily. Discussion of steroid therapy and side effects.  11/05/11- 78 year old male former smoker, Veteran,  History of asthma, COPD, obstructive sleep apnea complicated by DM, obesity, CAD/ischemic CM/sick sinus/bradycardia tachycardia/ pacemaker, history of prostate cancer, anemia ACUTE VISIT: SOB and cough-productive yellow in color.Would like Zpak. Seen 10 days ago and Depo-Medrol and Z-Pak did help at that visit. Comfortable with CPAP. He takes it off to cough.  01/04/12- 78 year old male former smoker, Veteran,  History of asthma, COPD, obstructive sleep apnea complicated by DM, obesity, CAD/ischemic CM/sick sinus/bradycardia tachycardia/ pacemaker, history of prostate cancer,  anemia. Needs recert for O2 at night per  [facility]; pt states he feels like he needs O2 during the day as well. He says he sleeps and feels better when  using oxygen. Continue CPAP 12/Advanced, with oxygen at 2 L currently. COPD assessment test (CAT) 31/40.  03/07/12- 78 year old male former smoker, Veteran,  History of asthma, COPD, obstructive sleep apnea complicated by DM, obesity, CAD/ischemic CM/sick sinus/bradycardia tachycardia/ pacemaker, history of prostate cancer, anemia. Has good and bad days; able to move through the home with O2 (3L) on. Has had flu vaccine He qualified for home oxygen and uses it 90% of the time. Needs to wear it to clean his apartment. CPAP 12 "can't sleep without it". Walks laps in parking lot for exercise.  ROS-see HPI Constitutional:   No-   weight loss, night sweats, fevers, +chills, fatigue, lassitude. HEENT:   No-  headaches, difficulty swallowing, tooth/dental problems, sore throat,       No-  sneezing, itching, ear ache, nasal congestion, post nasal drip,  CV:  No- recent  chest pain, orthopnea, PND. Mild chronic swelling in lower extremities, No- anasarca, dizziness, palpitations Resp: +  shortness of breath with exertion or at rest.              +  productive cough,  + non-productive cough,  No- coughing up of blood.              No- change in color of mucus.  No- wheezing.   Skin: No-   rash or lesions. GI:  No-   heartburn, indigestion, abdominal pain, nausea, vomiting,  GU:  MS: . Limiting hip pain Neuro-     nothing unusual Psych:  No- change in mood or affect. No depression or anxiety.  No memory loss.  OBJ General- Alert, Oriented, Affect-appropriate, Distress- none acute, overweight, O2 3 L Skin- rash-none, lesions- none, excoriation- none Lymphadenopathy- none Head- atraumatic            Eyes- Gross vision intact, PERRLA, conjunctivae clear secretions            Ears- Hearing aids, HOH            Nose- Clear, no-Septal dev, mucus, polyps,  erosion, perforation             Throat- Mallampati II , mucosa clear , drainage- none, tonsils- atrophic Neck- flexible , trachea midline, no stridor , thyroid nl, carotid no bruit Chest - symmetrical excursion , unlabored           Heart/CV- RRR with skipped beats , no murmur , no gallop  , no rub, nl s1 s2                           - JVD- none , edema- none, stasis changes- none, varices- none           Lung- +faint crackles persist, unlabored, wheeze- none,  dullness-none, rub- none. Raspy laugh.           Chest wall- pacemaker left chest Abd- Br/ Gen/ Rectal- Not done, not indicated Extrem- cyanosis- none, clubbing, none, atrophy- none, strength- deconditioned, cane Neuro- grossly intact to observation

## 2012-03-07 NOTE — Patient Instructions (Addendum)
Continue oxygen 2-3 L/M continuous and portable  Please call as needed

## 2012-03-15 NOTE — Assessment & Plan Note (Signed)
Good compliance and control with CPAP 

## 2012-03-15 NOTE — Assessment & Plan Note (Signed)
Oxygen helps. Status controlled.

## 2012-03-31 ENCOUNTER — Encounter: Payer: Self-pay | Admitting: Internal Medicine

## 2012-03-31 DIAGNOSIS — H04129 Dry eye syndrome of unspecified lacrimal gland: Secondary | ICD-10-CM | POA: Diagnosis not present

## 2012-03-31 DIAGNOSIS — H52209 Unspecified astigmatism, unspecified eye: Secondary | ICD-10-CM | POA: Diagnosis not present

## 2012-03-31 DIAGNOSIS — H02059 Trichiasis without entropian unspecified eye, unspecified eyelid: Secondary | ICD-10-CM | POA: Diagnosis not present

## 2012-03-31 DIAGNOSIS — E119 Type 2 diabetes mellitus without complications: Secondary | ICD-10-CM | POA: Diagnosis not present

## 2012-04-11 DIAGNOSIS — E7800 Pure hypercholesterolemia, unspecified: Secondary | ICD-10-CM | POA: Diagnosis not present

## 2012-04-11 DIAGNOSIS — I2510 Atherosclerotic heart disease of native coronary artery without angina pectoris: Secondary | ICD-10-CM | POA: Diagnosis not present

## 2012-04-11 DIAGNOSIS — I10 Essential (primary) hypertension: Secondary | ICD-10-CM | POA: Diagnosis not present

## 2012-04-13 ENCOUNTER — Telehealth: Payer: Self-pay | Admitting: Internal Medicine

## 2012-04-13 NOTE — Telephone Encounter (Signed)
I spoke with pt and advised him yes [facility] was correct he does not need to be re-qualified. He is current. He voiced his understanding and needed nothing further

## 2012-04-18 ENCOUNTER — Ambulatory Visit (INDEPENDENT_AMBULATORY_CARE_PROVIDER_SITE_OTHER): Payer: Medicare Other | Admitting: Internal Medicine

## 2012-04-18 ENCOUNTER — Encounter: Payer: Self-pay | Admitting: *Deleted

## 2012-04-18 ENCOUNTER — Encounter: Payer: Self-pay | Admitting: Internal Medicine

## 2012-04-18 VITALS — BP 110/52 | HR 65 | Temp 97.4°F | Ht 67.0 in | Wt 236.0 lb

## 2012-04-18 DIAGNOSIS — F419 Anxiety disorder, unspecified: Secondary | ICD-10-CM

## 2012-04-18 DIAGNOSIS — I10 Essential (primary) hypertension: Secondary | ICD-10-CM | POA: Diagnosis not present

## 2012-04-18 DIAGNOSIS — F411 Generalized anxiety disorder: Secondary | ICD-10-CM

## 2012-04-18 MED ORDER — SERTRALINE HCL 50 MG PO TABS: 50.0000 mg | ORAL_TABLET | Freq: Every day | ORAL | Status: DC

## 2012-04-18 NOTE — Assessment & Plan Note (Signed)
Hx same 01/2011 precipitated by move to [place] and senior appt complex- on prn xanax to help with panic attacks and started low dose sertraline 12/2011 Initially improved - but increasing anger spells and irritability in past 6 weeks Titrate up now The current medical regimen is effective;  continue present plan and medications.  Again risks and benefits discussed including prn use vs and scheduled use of meds - pt understands and agrees Verified no SI/HI

## 2012-04-18 NOTE — Progress Notes (Signed)
Subjective:    Patient ID: Eric [name], male    DOB: 01-17-1927, 78 y.o.   MRN: [id number]  HPI  Here for follow up -reviewed chronic medical issues:  CAD, hx CABG and multiple PTCA/DES - hosp 08/2010 reviewed with resolution of shoulder pain following DES. Follows with local cards ([facility]) - no residual CP/neck or shoulder symptoms  - the patient reports compliance with medication(s) as prescribed. Denies adverse side effects.  chronic pain -low back pain and DDD -hx falls early 2011 but none since starting steroid shots (ramos) Also R hip DJD, s/p injection and THA 08/2011 - much improved Down to 2-3 pain pills. day  COPD - frustrated by 24/7 O2 requirements since 12/2011 - previously QHS only with OSA  DM2, diet controlled -  Previously stopped Actos due to fluid retention and rash- Prev followed with endo, kerr q74mo- but off all meds including metformin so follows DM here checks sugars infreq - only goes over 120 if eat something sweet   HTN - reports compliance with ongoing medical treatment and no changes in medication dose or frequency. denies adverse side effects related to current therapy.   dyslipidemia - on statin - reports compliance with ongoing medical treatment and no changes in medication dose or frequency. denies adverse side effects related to current therapy.   GERD - reports compliance with ongoing medical treatment and no changes in medication dose or frequency. denies adverse side effects related to current therapy.   Anxiety -"nerves" started low dose prn xanax rx 01/2011 - initially improved, but less effective now associated with "mad spells" and anger flares - Spells cause a "head-flush sensation" and increase in acid/indigestion Hx same summer 2012, precipitated by move to [facility] senior apt complex Summer 2013 increase symptoms: precipitated by traffic/driving and neighbors "short fuse" but no violent tendency or fights Started ssri 12/2011 for same -  initially improved - fewer anger spells initially, but increasing irritability fall 2013    Past Medical History  Diagnosis Date  . LACTOSE INTOLERANCE   . OBESITY   . CARDIOMYOPATHY, ISCHEMIC   . AORTIC SCLEROSIS   . SICK SINUS/ TACHY-BRADY SYNDROME 09/2007    s/p PPM st judes  . PERIPHERAL VASCULAR DISEASE   . CAROTID BRUIT, RIGHT 02/27/2008  . IBS (irritable bowel syndrome)   . ALLERGIC RHINITIS   . ANEMIA-NOS   . OA (osteoarthritis)   . COPD   . GERD   . HYPERLIPIDEMIA   . HIATAL HERNIA   . Diverticulosis   . Prostate cancer     seed implants 2004  . DIABETES MELLITUS-TYPE II     diet controlled  . CORONARY ARTERY DISEASE     CABG 1995, PTCA/DES 2008, 2009 and 08/2010  . HYPERTENSION   . Asthma   . Sleep apnea     Review of Systems  Constitutional: Negative for fever and fatigue.  Respiratory: Negative for cough, shortness of breath and wheezing.   Cardiovascular: Negative for chest pain, palpitations and leg swelling.      Objective:   Physical Exam  BP 110/52  Pulse 65  Temp 97.4 F (36.3 C) (Oral)  Ht 5\' 7"  (1.702 m)  Wt 236 lb (107.049 kg)  BMI 36.96 kg/m2  SpO2 93% Wt Readings from Last 3 Encounters:  04/18/12 236 lb (107.049 kg)  03/07/12 237 lb 12.8 oz (107.865 kg)  02/04/12 234 lb 1.9 oz (106.196 kg)   Constitutional:  Overweight, but appears well-developed and well-nourished. No distress at  rest- ambulates with cane asst.  Neck: thick and short, Normal range of motion. Neck supple. No JVD present. No thyromegaly present.  Cardiovascular: Normal rate, regular rhythm and normal heart sounds.  No murmur heard. trace BLE edema Pulmonary/Chest: Effort normal and breath sounds normal. No respiratory distress. no wheezes.  Neurological: he is alert and oriented to person, place, and time. No cranial nerve deficit. Coordination normal.  Psychiatric: he has a mildly anxious/nervous mood and affect. behavior is normal. Judgment and thought content normal.        Lab Results  Component Value Date   WBC 4.6 10/05/2011   HGB 12.0* 10/05/2011   HCT 36.2* 10/05/2011   PLT 183.0 10/05/2011   CHOL 100 10/23/2010   TRIG 109.0 10/23/2010   HDL 33.80* 10/23/2010   ALT 15 08/13/2011   AST 19 08/13/2011   NA 131* 08/25/2011   K 4.3 08/25/2011   CL 99 08/25/2011   CREATININE 0.88 08/25/2011   BUN 21 08/25/2011   CO2 28 08/25/2011   TSH 1.19 04/28/2010   PSA 0.02* 09/10/2006   INR 1.00 08/13/2011   HGBA1C 6.8* 04/23/2011   MICROALBUR 0.5 04/18/2009    Assessment & Plan:  See problem list. Medications and labs reviewed today.

## 2012-04-18 NOTE — Assessment & Plan Note (Signed)
BP Readings from Last 3 Encounters:  04/18/12 110/52  03/07/12 122/76  02/04/12 130/60   The current medical regimen is effective;  continue present plan and medications.

## 2012-04-18 NOTE — Patient Instructions (Signed)
It was good to see you today. Increase dose sertraline to 50mg  every day to help control anxiety and "anger spells" Also use alprazolam if needed for "breakthrough" spells of anger/anxiety - same as before Your prescription(s) have been submitted to your pharmacy. Please take as directed and contact our office if you believe you are having problem(s) with the medication(s). Please schedule followup in 4-6 weeks to review symptoms and medication doses, call sooner if problems.

## 2012-04-20 ENCOUNTER — Telehealth: Payer: Self-pay | Admitting: Internal Medicine

## 2012-04-20 DIAGNOSIS — J449 Chronic obstructive pulmonary disease, unspecified: Secondary | ICD-10-CM

## 2012-04-20 NOTE — Telephone Encounter (Signed)
Spoke with patient-he is VERY unhappy with [facility] as they are not treating him right. He would like to change DME companies. Pt aware that order has been placed for [facility] to work this and see what options he has to change.

## 2012-05-11 DIAGNOSIS — H16109 Unspecified superficial keratitis, unspecified eye: Secondary | ICD-10-CM | POA: Diagnosis not present

## 2012-05-11 DIAGNOSIS — H04129 Dry eye syndrome of unspecified lacrimal gland: Secondary | ICD-10-CM | POA: Diagnosis not present

## 2012-05-11 DIAGNOSIS — H02059 Trichiasis without entropian unspecified eye, unspecified eyelid: Secondary | ICD-10-CM | POA: Diagnosis not present

## 2012-05-16 DIAGNOSIS — I495 Sick sinus syndrome: Secondary | ICD-10-CM | POA: Diagnosis not present

## 2012-05-16 DIAGNOSIS — Z950 Presence of cardiac pacemaker: Secondary | ICD-10-CM | POA: Diagnosis not present

## 2012-05-27 DIAGNOSIS — L219 Seborrheic dermatitis, unspecified: Secondary | ICD-10-CM | POA: Diagnosis not present

## 2012-05-27 DIAGNOSIS — D235 Other benign neoplasm of skin of trunk: Secondary | ICD-10-CM | POA: Diagnosis not present

## 2012-05-27 DIAGNOSIS — L821 Other seborrheic keratosis: Secondary | ICD-10-CM | POA: Diagnosis not present

## 2012-05-27 DIAGNOSIS — L259 Unspecified contact dermatitis, unspecified cause: Secondary | ICD-10-CM | POA: Diagnosis not present

## 2012-05-30 ENCOUNTER — Encounter: Payer: Self-pay | Admitting: Internal Medicine

## 2012-05-30 ENCOUNTER — Ambulatory Visit (INDEPENDENT_AMBULATORY_CARE_PROVIDER_SITE_OTHER): Payer: Medicare Other | Admitting: Internal Medicine

## 2012-05-30 ENCOUNTER — Other Ambulatory Visit (INDEPENDENT_AMBULATORY_CARE_PROVIDER_SITE_OTHER): Payer: Medicare Other

## 2012-05-30 VITALS — BP 110/60 | HR 60 | Temp 97.9°F | Ht 67.0 in | Wt 243.8 lb

## 2012-05-30 DIAGNOSIS — I10 Essential (primary) hypertension: Secondary | ICD-10-CM

## 2012-05-30 DIAGNOSIS — F419 Anxiety disorder, unspecified: Secondary | ICD-10-CM

## 2012-05-30 DIAGNOSIS — E785 Hyperlipidemia, unspecified: Secondary | ICD-10-CM

## 2012-05-30 DIAGNOSIS — E119 Type 2 diabetes mellitus without complications: Secondary | ICD-10-CM

## 2012-05-30 DIAGNOSIS — F411 Generalized anxiety disorder: Secondary | ICD-10-CM | POA: Diagnosis not present

## 2012-05-30 LAB — LIPID PANEL
Cholesterol: 106 mg/dL (ref 0–200)
HDL: 28.7 mg/dL — ABNORMAL LOW (ref >39.00)
LDL Cholesterol: 55 mg/dL (ref 0–99)
Triglycerides: 114 mg/dL (ref 0.0–149.0)
VLDL: 22.8 mg/dL (ref 0.0–40.0)

## 2012-05-30 NOTE — Assessment & Plan Note (Signed)
Hx same 01/2011 precipitated by move to [place] and senior appt complex- on prn xanax to help with panic attacks and started low dose sertraline 12/2011 Initially improved - but increasing anger spells and irritability fall 2013 Titrated up 04/2012 - ?no significant change yet Also uses prn alprazolam infrequently Again risks and benefits discussed including prn use vs and scheduled use of meds - pt understands and agrees Verified no SI/HI

## 2012-05-30 NOTE — Patient Instructions (Addendum)
It was good to see you today. We have reviewed your prior records including labs and tests today Test(s) ordered today. Your results will be released to MyChart (or called to you) after review, usually within 72hours after test completion. If any changes need to be made, you will be notified at that same time. Increase dose sertraline to 50mg  every day to help control anxiety and "anger spells" Also use alprazolam if needed for "breakthrough" spells of anger/anxiety - same as before Please schedule followup in 4 months to review symptoms and medication doses, call sooner if problems.

## 2012-05-30 NOTE — Progress Notes (Signed)
Subjective:    Patient ID: Eric [name], male    DOB: 04-21-1927, 78 y.o.   MRN: [id number]  HPI  Here for follow up - reviewed chronic medical issues:  CAD, hx CABG and multiple PTCA/DES - hosp 08/2010 reviewed with resolution of shoulder pain following DES. Follows with local cards ([facility]) - no residual CP/neck or shoulder symptoms  - the patient reports compliance with medication(s) as prescribed. Denies adverse side effects.  chronic pain -low back pain and DDD -hx falls early 2011 but none since starting steroid shots (ramos) Also R hip DJD, s/p injection and THA 08/2011 - much improved, but transient  Down to 2-3 pain pills per day  COPD - frustrated by 24/7 O2 requirements since 12/2011 - previously QHS only with OSA  DM2, diet controlled -  Previously stopped Actos due to fluid retention and rash- Prev followed with endo, kerr q32mo- but off all meds including metformin so follows DM here checks sugars infreq - only goes over 120 if eat something sweet   HTN - reports compliance with ongoing medical treatment and no changes in medication dose or frequency. denies adverse side effects related to current therapy.   dyslipidemia - on statin - reports compliance with ongoing medical treatment and no changes in medication dose or frequency. denies adverse side effects related to current therapy.   GERD - reports compliance with ongoing medical treatment and no changes in medication dose or frequency. denies adverse side effects related to current therapy.   Anxiety -"nerves" started low dose prn xanax rx 01/2011 - initially improved, but less effective now associated with "mad spells" and anger flares - Spells cause a "head-flush sensation" and increase in acid/indigestion Hx same summer 2012, precipitated by move to [facility] senior apt complex Summer 2013 increase symptoms: precipitated by traffic/driving and neighbors "short fuse" but no violent tendency or fights Started ssri 12/2011  for same - initially improved - fewer anger spells initially, but increasing irritability fall 2013    Past Medical History  Diagnosis Date  . LACTOSE INTOLERANCE   . OBESITY   . CARDIOMYOPATHY, ISCHEMIC   . AORTIC SCLEROSIS   . SICK SINUS/ TACHY-BRADY SYNDROME 09/2007    s/p PPM st judes  . PERIPHERAL VASCULAR DISEASE   . CAROTID BRUIT, RIGHT 02/27/2008  . IBS (irritable bowel syndrome)   . ALLERGIC RHINITIS   . ANEMIA-NOS   . OA (osteoarthritis)   . COPD   . GERD   . HYPERLIPIDEMIA   . HIATAL HERNIA   . Diverticulosis   . Prostate cancer     seed implants 2004  . DIABETES MELLITUS-TYPE II     diet controlled  . CORONARY ARTERY DISEASE     CABG 1995, PTCA/DES 2008, 2009 and 08/2010  . HYPERTENSION   . Asthma   . Sleep apnea     Review of Systems  Constitutional: Negative for fever and fatigue.  Respiratory: Negative for cough, shortness of breath and wheezing.   Cardiovascular: Negative for chest pain, palpitations and leg swelling.      Objective:   Physical Exam  BP 110/60  Pulse 60  Temp 97.9 F (36.6 C) (Oral)  Ht 5\' 7"  (1.702 m)  Wt 243 lb 12.8 oz (110.587 kg)  BMI 38.18 kg/m2  SpO2 95% Wt Readings from Last 3 Encounters:  05/30/12 243 lb 12.8 oz (110.587 kg)  04/18/12 236 lb (107.049 kg)  03/07/12 237 lb 12.8 oz (107.865 kg)   Constitutional:  Overweight, but  appears well-developed and well-nourished. No distress at rest- ambulates with cane asst.  Neck: thick and short, Normal range of motion. Neck supple. No JVD present. No thyromegaly present.  Cardiovascular: Normal rate, regular rhythm and normal heart sounds.  No murmur heard. trace BLE edema Pulmonary/Chest: Effort normal and breath sounds normal. No respiratory distress. no wheezes.  Neurological: he is alert and oriented to person, place, and time. No cranial nerve deficit. Coordination normal.  Psychiatric: he has a mildly anxious/nervous mood and affect. behavior is normal. Judgment and  thought content normal.      Lab Results  Component Value Date   WBC 4.6 10/05/2011   HGB 12.0* 10/05/2011   HCT 36.2* 10/05/2011   PLT 183.0 10/05/2011   CHOL 100 10/23/2010   TRIG 109.0 10/23/2010   HDL 33.80* 10/23/2010   ALT 15 08/13/2011   AST 19 08/13/2011   NA 131* 08/25/2011   K 4.3 08/25/2011   CL 99 08/25/2011   CREATININE 0.88 08/25/2011   BUN 21 08/25/2011   CO2 28 08/25/2011   TSH 1.19 04/28/2010   PSA 0.02* 09/10/2006   INR 1.00 08/13/2011   HGBA1C 6.8* 04/23/2011   MICROALBUR 0.5 04/18/2009    Assessment & Plan:  See problem list. Medications and labs reviewed today.

## 2012-05-30 NOTE — Assessment & Plan Note (Signed)
s/p DES 08/2010, hx same and CABG 1995 On statin - last lipids at goal, recheck annually The current medical regimen is effective;  continue present plan and medications. 

## 2012-05-30 NOTE — Assessment & Plan Note (Signed)
BP Readings from Last 3 Encounters:  05/30/12 110/60  04/18/12 110/52  03/07/12 122/76   The current medical regimen is effective;  continue present plan and medications.

## 2012-05-30 NOTE — Assessment & Plan Note (Signed)
Diet controlled, no longer follows with endo (prev kerr) actos caused edema/fluid retention Intolerant of metformin due to nausea and "hospitalization"  Lab Results  Component Value Date   HGBA1C 6.8* 04/23/2011

## 2012-06-06 ENCOUNTER — Ambulatory Visit: Payer: Medicare Other | Admitting: Internal Medicine

## 2012-06-25 ENCOUNTER — Encounter (HOSPITAL_COMMUNITY): Payer: Self-pay | Admitting: Adult Health

## 2012-06-25 ENCOUNTER — Emergency Department (HOSPITAL_COMMUNITY)
Admission: EM | Admit: 2012-06-25 | Discharge: 2012-06-25 | Disposition: A | Discharge status: Home / Self Care | Payer: Medicare Other | Attending: Emergency Medicine | Admitting: Emergency Medicine

## 2012-06-25 ENCOUNTER — Emergency Department (HOSPITAL_COMMUNITY): Payer: Medicare Other

## 2012-06-25 DIAGNOSIS — E119 Type 2 diabetes mellitus without complications: Secondary | ICD-10-CM | POA: Insufficient documentation

## 2012-06-25 DIAGNOSIS — J449 Chronic obstructive pulmonary disease, unspecified: Secondary | ICD-10-CM | POA: Diagnosis not present

## 2012-06-25 DIAGNOSIS — Z8679 Personal history of other diseases of the circulatory system: Secondary | ICD-10-CM | POA: Diagnosis not present

## 2012-06-25 DIAGNOSIS — Z951 Presence of aortocoronary bypass graft: Secondary | ICD-10-CM | POA: Diagnosis not present

## 2012-06-25 DIAGNOSIS — Z8719 Personal history of other diseases of the digestive system: Secondary | ICD-10-CM | POA: Insufficient documentation

## 2012-06-25 DIAGNOSIS — K219 Gastro-esophageal reflux disease without esophagitis: Secondary | ICD-10-CM | POA: Diagnosis not present

## 2012-06-25 DIAGNOSIS — Z8546 Personal history of malignant neoplasm of prostate: Secondary | ICD-10-CM | POA: Insufficient documentation

## 2012-06-25 DIAGNOSIS — Z87891 Personal history of nicotine dependence: Secondary | ICD-10-CM | POA: Diagnosis not present

## 2012-06-25 DIAGNOSIS — Z8739 Personal history of other diseases of the musculoskeletal system and connective tissue: Secondary | ICD-10-CM | POA: Insufficient documentation

## 2012-06-25 DIAGNOSIS — N281 Cyst of kidney, acquired: Secondary | ICD-10-CM | POA: Diagnosis not present

## 2012-06-25 DIAGNOSIS — R1013 Epigastric pain: Secondary | ICD-10-CM | POA: Diagnosis not present

## 2012-06-25 DIAGNOSIS — E669 Obesity, unspecified: Secondary | ICD-10-CM | POA: Diagnosis not present

## 2012-06-25 DIAGNOSIS — Z7901 Long term (current) use of anticoagulants: Secondary | ICD-10-CM | POA: Diagnosis not present

## 2012-06-25 DIAGNOSIS — R109 Unspecified abdominal pain: Secondary | ICD-10-CM

## 2012-06-25 DIAGNOSIS — J45909 Unspecified asthma, uncomplicated: Secondary | ICD-10-CM | POA: Insufficient documentation

## 2012-06-25 DIAGNOSIS — Z950 Presence of cardiac pacemaker: Secondary | ICD-10-CM | POA: Diagnosis not present

## 2012-06-25 DIAGNOSIS — R1033 Periumbilical pain: Secondary | ICD-10-CM | POA: Diagnosis not present

## 2012-06-25 DIAGNOSIS — D649 Anemia, unspecified: Secondary | ICD-10-CM | POA: Diagnosis not present

## 2012-06-25 DIAGNOSIS — J4489 Other specified chronic obstructive pulmonary disease: Secondary | ICD-10-CM | POA: Insufficient documentation

## 2012-06-25 DIAGNOSIS — Z79899 Other long term (current) drug therapy: Secondary | ICD-10-CM | POA: Diagnosis not present

## 2012-06-25 DIAGNOSIS — M1990 Unspecified osteoarthritis, unspecified site: Secondary | ICD-10-CM | POA: Diagnosis not present

## 2012-06-25 DIAGNOSIS — Z9861 Coronary angioplasty status: Secondary | ICD-10-CM | POA: Insufficient documentation

## 2012-06-25 DIAGNOSIS — I2510 Atherosclerotic heart disease of native coronary artery without angina pectoris: Secondary | ICD-10-CM | POA: Diagnosis not present

## 2012-06-25 DIAGNOSIS — K5900 Constipation, unspecified: Secondary | ICD-10-CM | POA: Diagnosis not present

## 2012-06-25 LAB — CBC WITH DIFFERENTIAL/PLATELET
HCT: 39.6 % (ref 39.0–52.0)
Hemoglobin: 13.2 g/dL (ref 13.0–17.0)
Lymphs Abs: 0.3 10*3/uL — ABNORMAL LOW (ref 0.7–4.0)
MCH: 32.4 pg (ref 26.0–34.0)
Monocytes Absolute: 0.7 10*3/uL (ref 0.1–1.0)
Monocytes Relative: 9 % (ref 3–12)
Neutro Abs: 5.8 10*3/uL (ref 1.7–7.7)
Neutrophils Relative %: 82 % — ABNORMAL HIGH (ref 43–77)
RBC: 4.07 MIL/uL — ABNORMAL LOW (ref 4.22–5.81)

## 2012-06-25 LAB — COMPREHENSIVE METABOLIC PANEL
Alkaline Phosphatase: 74 U/L (ref 39–117)
BUN: 15 mg/dL (ref 6–23)
Chloride: 100 mEq/L (ref 96–112)
Creatinine, Ser: 0.78 mg/dL (ref 0.50–1.35)
GFR calc Af Amer: >90 mL/min (ref >90)
GFR calc non Af Amer: 81 mL/min — ABNORMAL LOW (ref >90)
Glucose, Bld: 149 mg/dL — ABNORMAL HIGH (ref 70–99)
Potassium: 4 mEq/L (ref 3.5–5.1)
Total Bilirubin: 0.4 mg/dL (ref 0.3–1.2)

## 2012-06-25 LAB — LIPASE, BLOOD: Lipase: 9 U/L — ABNORMAL LOW (ref 11–59)

## 2012-06-25 MED ORDER — MAGNESIUM CITRATE PO SOLN: 296.0000 mL | Freq: Once | ORAL | Status: DC

## 2012-06-25 MED ORDER — IOHEXOL 300 MG/ML  SOLN
50.0000 mL | Freq: Once | INTRAMUSCULAR | Status: AC | PRN
  Administered 2012-06-25: 50 mL via ORAL

## 2012-06-25 MED ORDER — IOHEXOL 300 MG/ML  SOLN
100.0000 mL | Freq: Once | INTRAMUSCULAR | Status: AC | PRN
  Administered 2012-06-25: 100 mL via INTRAVENOUS

## 2012-06-25 MED ORDER — SODIUM CHLORIDE 0.9 % IV SOLN
Freq: Once | INTRAVENOUS | Status: AC
  Administered 2012-06-25: 03:00:00 via INTRAVENOUS

## 2012-06-25 MED ORDER — ONDANSETRON HCL 4 MG/2ML IJ SOLN
4.0000 mg | Freq: Once | INTRAMUSCULAR | Status: AC
  Administered 2012-06-25: 4 mg via INTRAVENOUS
  Filled 2012-06-25: qty 2

## 2012-06-25 NOTE — ED Notes (Signed)
Patient transported to CT 

## 2012-06-25 NOTE — ED Notes (Signed)
Presents with abdominal pain, emesis and abdominal distention since 3 am. Reports SOB with pain. Pain is located in the epigastric area and radiates to both sides and down to umbilicus. Pt took Hydrocodone and nexium for pain with relief of pain. Last BM 06-23-2012. Abdomen is distended and tender to palpation.

## 2012-06-25 NOTE — ED Provider Notes (Signed)
History     CSN: [id number]  Arrival date & time 06/25/12  0100   First MD Initiated Contact with Patient 06/25/12 0215      Chief Complaint  Patient presents with  . Abdominal Pain    (Consider location/radiation/quality/duration/timing/severity/associated sxs/prior treatment) HPI Comments: Pt is an elderly male with hx of prior surgery on abdomen for hernias who has had 2 days of intermittent abdominal pain and swelling - tonight it became worse, was assocaited with no BM in last 2 days, minimal gas per rectum and some vomiting of stomach acid this evening.  Currently the sx are moderate, not associated with CP, SOB, cough, fevers or swelling or rahes.  No dysuria.  Has hx of what he describes was possibly a prior SBO  Patient is a 79 y.o. male presenting with abdominal pain. The history is provided by the patient and the spouse.  Abdominal Pain The primary symptoms of the illness include abdominal pain.    Past Medical History  Diagnosis Date  . LACTOSE INTOLERANCE   . OBESITY   . CARDIOMYOPATHY, ISCHEMIC   . AORTIC SCLEROSIS   . SICK SINUS/ TACHY-BRADY SYNDROME 09/2007    s/p PPM st judes  . PERIPHERAL VASCULAR DISEASE   . CAROTID BRUIT, RIGHT 02/27/2008  . IBS (irritable bowel syndrome)   . ALLERGIC RHINITIS   . ANEMIA-NOS   . OA (osteoarthritis)   . COPD   . GERD   . HYPERLIPIDEMIA   . HIATAL HERNIA   . Diverticulosis   . Prostate cancer     seed implants 2004  . DIABETES MELLITUS-TYPE II     diet controlled  . CORONARY ARTERY DISEASE     CABG 1995, PTCA/DES 2008, 2009 and 08/2010  . HYPERTENSION   . Asthma   . Sleep apnea     Past Surgical History  Procedure Date  . Ptca 2008, 2009, 2012    with DES  . Partial small bowel obstruction 2009  . Bilateral cataracts   . Pacemaker insertion     DDD/[employer]         Last interrogation 2/13  on chart     Pacemaker guideline order Dr Katrinka Blazing on chart  . Hernia repair   . Lumbar disc surgery 12/2008  .  Coronary artery bypass graft   . Total hip arthroplasty 08/21/2011    Procedure: TOTAL HIP ARTHROPLASTY;  Surgeon: Javier Docker, MD;  Location: [facility];  Service: Orthopedics;  Laterality: Right;    Family History  Problem Relation Age of Onset  . Colon cancer Neg Hx     History  Substance Use Topics  . Smoking status: Former Smoker    Quit date: 06/09/1995  . Smokeless tobacco: Never Used     Comment: Retired, lives alone, but has a lady friend  . Alcohol Use: No      Review of Systems  Gastrointestinal: Positive for abdominal pain.  All other systems reviewed and are negative.    Allergies  Actos; Celebrex; Demerol; Morphine and related; Ciprofloxacin; Metformin; and Zocor  Home Medications   Current Outpatient Rx  Name  Route  Sig  Dispense  Refill  . ALBUTEROL SULFATE HFA 108 (90 BASE) MCG/ACT IN AERS   Inhalation   Inhale 2 puffs into the lungs every 4 (four) hours as needed. Wheezing          . ALBUTEROL SULFATE (2.5 MG/3ML) 0.083% IN NEBU   Nebulization   Take 3 mLs (2.5  mg total) by nebulization every 4 (four) hours as needed. Wheezing   75 mL   5   . IPRATROPIUM-ALBUTEROL 18-103 MCG/ACT IN AERO   Inhalation   Inhale 2 puffs into the lungs every 6 (six) hours as needed. Wheezing          . ALPRAZOLAM 0.5 MG PO TABS   Oral   Take 1 tablet (0.5 mg total) by mouth 3 (three) times daily as needed for anxiety.   30 tablet   2   . ALUMINUM & MAGNESIUM HYDROXIDE 225-200 MG/5ML PO SUSP   Oral   Take 15 mLs by mouth every 6 (six) hours as needed. Heart burn          . AMLODIPINE BESYLATE 5 MG PO TABS   Oral   Take 0.5 tablets (2.5 mg total) by mouth daily.         . BUMETANIDE 1 MG PO TABS   Oral   Take 1 mg by mouth daily after breakfast.          . CLOPIDOGREL BISULFATE 75 MG PO TABS   Oral   Take 75 mg by mouth daily after breakfast.          . ESOMEPRAZOLE MAGNESIUM 40 MG PO CPDR   Oral   Take 40 mg by mouth daily before  breakfast.          . FERROUS SULFATE 325 (65 FE) MG PO TABS   Oral   Take 325 mg by mouth 2 (two) times daily.         [name] FLUTICASONE PROPIONATE 50 MCG/ACT NA SUSP   Nasal   Place 2 sprays into the nose daily as needed. Allergies          . HYDROCODONE-ACETAMINOPHEN 7.5-325 MG PO TABS   Oral   Take 1-2 tablets by mouth every 4 (four) hours as needed.         . ISOSORBIDE MONONITRATE ER 60 MG PO TB24      60 mg BID per pt since last visit with Dr Katrinka Blazing         . METHOCARBAMOL 500 MG PO TABS   Oral   Take 500 mg by mouth 4 (four) times daily as needed.         [name] NIACIN ER 1000 MG PO TBCR   Oral   Take 1,000 mg by mouth at bedtime.          [name] NITROGLYCERIN 0.4 MG SL SUBL   Sublingual   Place 0.4 mg under the tongue every 5 (five) minutes as needed. Chest pain          . POLYETHYL GLYCOL-PROPYL GLYCOL 0.4-0.3 % OP SOLN   Ophthalmic   Apply 2 drops to eye at bedtime.         [name] POLYETHYLENE GLYCOL 3350 PO PACK   Oral   Take 17 g by mouth daily as needed. Constipation          . PRAVASTATIN SODIUM 20 MG PO TABS   Oral   Take 60 mg by mouth at bedtime.          . SENNOSIDES 8.6 MG PO TABS   Oral   Take 2 tablets by mouth 2 (two) times a week.          . SERTRALINE HCL 50 MG PO TABS   Oral   Take 1 tablet (50 mg total) by mouth daily.   30 tablet   5   .  SUCRALFATE 1 G PO TABS   Oral   Take 1 g by mouth 4 (four) times daily.         [name] VALSARTAN 160 MG PO TABS   Oral   Take 160 mg by mouth 2 (two) times daily.          [name] MAGNESIUM CITRATE PO SOLN   Oral   Take 296 mLs by mouth once. OTC   300 mL   0     BP 120/48  Pulse 67  Temp 97.5 F (36.4 C) (Oral)  Resp 14  SpO2 94%  Physical Exam  Nursing note and vitals reviewed. Constitutional: He appears well-developed and well-nourished. No distress.  HENT:  Head: Normocephalic and atraumatic.  Mouth/Throat: Oropharynx is clear and moist. No oropharyngeal exudate.  Eyes:  Conjunctivae normal and EOM are normal. Pupils are equal, round, and reactive to light. Right eye exhibits no discharge. Left eye exhibits no discharge. No scleral icterus.  Neck: Normal range of motion. Neck supple. No JVD present. No thyromegaly present.  Cardiovascular: Normal rate, regular rhythm, normal heart sounds and intact distal pulses.  Exam reveals no gallop and no friction rub.   No murmur heard. Pulmonary/Chest: Effort normal and breath sounds normal. No respiratory distress. He has no wheezes. He has no rales. He exhibits no tenderness.  Abdominal: Soft. Bowel sounds are normal. He exhibits distension ( upper abd distention, no tympanitic sounds to percussion). He exhibits no mass. There is tenderness ( mninimal epigastric and periumbilical ttp). There is no rebound and no guarding.  Musculoskeletal: Normal range of motion. He exhibits no edema and no tenderness.  Lymphadenopathy:    He has no cervical adenopathy.  Neurological: He is alert. Coordination normal.  Skin: Skin is warm and dry. No rash noted. No erythema.  Psychiatric: He has a normal mood and affect. His behavior is normal.    ED Course  Procedures (including critical care time)  Labs Reviewed  CBC WITH DIFFERENTIAL - Abnormal; Notable for the following:    RBC 4.07 (*)     Neutrophils Relative 82 (*)     Lymphocytes Relative 5 (*)     Lymphs Abs 0.3 (*)     All other components within normal limits  COMPREHENSIVE METABOLIC PANEL - Abnormal; Notable for the following:    Sodium 134 (*)     Glucose, Bld 149 (*)     Calcium 10.9 (*)     GFR calc non Af Amer 81 (*)     All other components within normal limits  LIPASE, BLOOD - Abnormal; Notable for the following:    Lipase 9 (*)     All other components within normal limits  POCT I-STAT TROPONIN I  POCT I-STAT TROPONIN I   Ct Abdomen Pelvis W Contrast  06/25/2012  *RADIOLOGY REPORT*  Clinical Data: Abdominal pain, emesis, and abdominal distension.  Shortness of breath.  CT ABDOMEN AND PELVIS WITH CONTRAST  Technique:  Multidetector CT imaging of the abdomen and pelvis was performed following the standard protocol during bolus administration of intravenous contrast.  Contrast: OMNIPAQUE IOHEXOL 300 MG/ML  SOLN  Comparison: 12/19/2010  Findings: Infiltration or atelectasis in both lung bases with mild emphysematous changes.  The appearance is similar to the previous study.  Postoperative changes in the mediastinum.  Low attenuation lesions scattered throughout the liver, largest measuring about 4.4 cm diameter.  These are likely to represent cysts or hemangiomas.  The appearance is stable since the previous  study.  Diffuse fatty infiltration of the pancreas.  The gallbladder, spleen, adrenal glands, and retroperitoneal lymph nodes are unremarkable.  There is a cyst in the lower pole of the left kidney, stable since previous study.  No hydronephrosis in either kidney.  Calcification of the abdominal aorta and branch vessel origins.  No aneurysm.  Stool filled colon without distension.  The stomach and small bowel are not abnormally distended.  Prominent visceral adipose tissues.  No free air or free fluid in the abdomen. 14 mm nodule in the soft tissues posterior to the right lobe of the liver.  This is stable.  Pelvis:  Prostate seed implants in place.  Penile prosthesis with reservoir on the left.  Bladder wall is not thickened.  No free or loculated pelvic fluid collections.  The appendix is normal. Diverticulosis of the sigmoid colon without evidence of diverticulitis.  Postoperative changes in the right hip.  The degenerative changes throughout the lumbar spine.  No focal bone lesions are appreciated.  IMPRESSION: Suggestion of infiltration or atelectasis in both lung bases.  No acute process demonstrated in the abdomen or pelvis.  Stable chronic findings include diverticulosis without diverticulitis, multiple cysts or hemangiomas in the liver, left  renal cyst, and fatty infiltration of the pancreas.   Original Report Authenticated By: Burman Nieves, M.D.      1. Abdominal pain   2. Constipation       MDM  Pt has s/s of SBO, has normal Vs but ongoing sx - CT to r/o other abd pathology, zofran, pt declines pain meds at this time and has allergy to other opiate meds.  HD stable at this time.  ED ECG REPORT  I personally interpreted this EKG   Date: 06/25/2012   Rate: 72  Rhythm: normal sinus rhythm  QRS Axis: normal  Intervals: normal  ST/T Wave abnormalities: normal  Conduction Disutrbances:none  Narrative Interpretation: PRWP  Old EKG Reviewed: unchanged   I have reexamined the patient and he has no abdominal tenderness at this time. His lab work shows that he has normal electrolytes, normal liver function and lipase and normal blood counts. There is no leukocytosis, no elevated troponin, no findings of concern on EKG and his CT scan was read as stool filled colon but no signs of bowel obstruction or other significant abnormalities. The CAT scan didn't comment on atelectasis at the lung bases and after requestioning the patient he again denies any shortness of breath or significant coughing or fevers. At this time the patient appears stable for discharge, he will be given magnesium citrate in addition to the MiraLAX and she has been taking the    Vida Roller, MD 06/25/12 [phone]

## 2012-06-27 ENCOUNTER — Telehealth: Payer: Self-pay | Admitting: Internal Medicine

## 2012-06-27 NOTE — Telephone Encounter (Signed)
Caller: Antoine/Patient; Phone: [phone]; Reason for Call: Patient was told by Dr. Felicity Coyer to let her know if Sertraline prescription is not working well.  He was told by the doctor at the [facility] to split his dosage in half and only take 25mg .  Patient reports he has been taking 50mg  daily instead of the 25mg  per Dr. Diamantina Monks instructions at the last office visit.  Patient reports that she told him that she would send his physician at the [place] a note regarding the new prescription amount and the reason for the prescription.  The physician's name, Dr.  Paula Compton MUST BE ON THE [facility], or they will disregard the prescription.  Please call the patient if you have any questions.

## 2012-06-27 NOTE — Telephone Encounter (Signed)
Please fax my last OV 05/30/12 to [facility] provider as requested - hopefully Riley Nearing will then allow the [facility] provider to rx sertraline at 50mg  qd dose. thanks

## 2012-07-13 DIAGNOSIS — E1149 Type 2 diabetes mellitus with other diabetic neurological complication: Secondary | ICD-10-CM | POA: Diagnosis not present

## 2012-07-13 DIAGNOSIS — E669 Obesity, unspecified: Secondary | ICD-10-CM | POA: Diagnosis not present

## 2012-07-13 DIAGNOSIS — E1142 Type 2 diabetes mellitus with diabetic polyneuropathy: Secondary | ICD-10-CM | POA: Diagnosis not present

## 2012-07-20 ENCOUNTER — Ambulatory Visit (INDEPENDENT_AMBULATORY_CARE_PROVIDER_SITE_OTHER): Payer: Medicare Other | Admitting: Internal Medicine

## 2012-07-20 ENCOUNTER — Encounter: Payer: Self-pay | Admitting: Internal Medicine

## 2012-07-20 VITALS — BP 150/66 | HR 60 | Ht 67.0 in | Wt 244.2 lb

## 2012-07-20 DIAGNOSIS — R1032 Left lower quadrant pain: Secondary | ICD-10-CM | POA: Diagnosis not present

## 2012-07-20 DIAGNOSIS — K5903 Drug induced constipation: Secondary | ICD-10-CM

## 2012-07-20 DIAGNOSIS — K5909 Other constipation: Secondary | ICD-10-CM

## 2012-07-20 MED ORDER — DICLOFENAC SODIUM 1 % TD GEL: 4.0000 g | Freq: Four times a day (QID) | TRANSDERMAL | Status: DC

## 2012-07-20 NOTE — Progress Notes (Signed)
  Subjective:    Patient ID: Eric [name], male    DOB: 22-Apr-1927, 79 y.o.   MRN: [id number]  HPIThe patient presents for evaluation of LLQ pain. He had been to the ED on 06/25/2012 due to LLQ pain and nausea with vomiting. He thought he may have another partial SBO. CT abd/pelvis did not reveal any problems. Laxatives were recommended, he took Mg Citrate.He tells me he had rotated his mattress that day or the one before. He is better but still has intermittent pain. He feels better if he eats. There is no other association with GI tract function.He does get constipated at times, especially when he uses his hydrocodone for back pain.  Medications, allergies, past medical history, past surgical history, family history and social history are reviewed and updated in the EMR.  Review of Systems Back pain    Objective:   Physical Exam General:  NAD Eyes:   anicteric Abdomen:  Obese, soft tender in LLQ worse with muscle tension especially, BS+ Neuro: A and O x 3    Data Reviewed:  1/18 ED visit with labs and CT abd/pelvis     Assessment & Plan:   1. Abdominal wall pain in left lower quadrant   2. Constipation due to pain medication    1. Voltaren gel qid x 1 month max 2. Brace abdominal wall with pillow when coughing, sneezing, straining 3. Heating pad prn 4. Laxatives prn (Senokot, MiraLax) 5. See me prn  I appreciate the opportunity to care for this patient.

## 2012-07-20 NOTE — Patient Instructions (Addendum)
We have sent the following medications to your pharmacy for you to pick up at your convenience: Voltaren Gel  You may also use a heating pad to the area on and off.  Use miralax and senekot as needed for constipation.  Hug a pillow when you cough.  It could take several weeks to get better.  Follow up with [place] as needed.  Thank you for choosing me and [facility].  Iva Boop, M.D., [facility]

## 2012-07-25 ENCOUNTER — Telehealth: Payer: Self-pay | Admitting: Internal Medicine

## 2012-07-25 MED ORDER — METRONIDAZOLE 250 MG PO TABS: 250.0000 mg | ORAL_TABLET | Freq: Three times a day (TID) | ORAL | Status: DC

## 2012-07-25 NOTE — Telephone Encounter (Signed)
Metronidazole 250 mg tid x 7 days no refills  Re-try the voltaren gel - highly doubt it raised BP - suspect pain might have done that  Ask him what his BP was also

## 2012-07-25 NOTE — Telephone Encounter (Signed)
Patient reports that he has pain relieved by eating and that he is not able to use the Voltaren gel prescribed last week "it made my blood pressure go up".  He reports malodorous bowel movements.  He feels he has a "bacteria in my stomach".  He does not feel his pain is a muscle strain, but this bacteria.  He is requesting antibiotics.  Pain is present with position changes, he denies diarrhea or loose BM.  Dr. Leone Payor please advise.

## 2012-07-25 NOTE — Telephone Encounter (Signed)
Patient advised of Dr. Leone Payor recommendations. BP was 153/60, his reading this am was 118/50, he feels the increase was due to Voltaren.  He is advised to try it again and see if that helps the pain and call back for questions or concerns

## 2012-08-02 DIAGNOSIS — H6120 Impacted cerumen, unspecified ear: Secondary | ICD-10-CM | POA: Diagnosis not present

## 2012-08-02 DIAGNOSIS — H905 Unspecified sensorineural hearing loss: Secondary | ICD-10-CM | POA: Diagnosis not present

## 2012-08-02 DIAGNOSIS — H7200 Central perforation of tympanic membrane, unspecified ear: Secondary | ICD-10-CM | POA: Diagnosis not present

## 2012-08-05 ENCOUNTER — Telehealth: Payer: Self-pay | Admitting: Internal Medicine

## 2012-08-05 NOTE — Telephone Encounter (Signed)
Pt states that his stomach stops hurting when he eats fried chicken and french fries. Pt reports that when he eats yogart and cream of wheat his stomach hurts real bad. Pt also states his BM's smell very bad and that when he passes gas it would "choke a billygoat!" Pt thinks there is some kind of bacteria in his stomach causing the pain. Please advise.

## 2012-08-05 NOTE — Telephone Encounter (Signed)
Pt scheduled to see Dr. Leone Payor 08/08/12@11 :30am. Pt aware of appt date and time.

## 2012-08-05 NOTE — Telephone Encounter (Signed)
I need to see him in office again or have an NP/PA do that

## 2012-08-05 NOTE — Telephone Encounter (Signed)
Pt states that his stomach has been hurting for about 10weeks. States it hurts on the left side. He has finished taking the flagyl and states that it did not help.

## 2012-08-08 ENCOUNTER — Ambulatory Visit: Payer: Medicare Other | Admitting: Internal Medicine

## 2012-08-15 DIAGNOSIS — M5137 Other intervertebral disc degeneration, lumbosacral region: Secondary | ICD-10-CM | POA: Diagnosis not present

## 2012-08-22 ENCOUNTER — Ambulatory Visit: Payer: Medicare Other | Admitting: Internal Medicine

## 2012-08-26 DIAGNOSIS — H906 Mixed conductive and sensorineural hearing loss, bilateral: Secondary | ICD-10-CM | POA: Diagnosis not present

## 2012-08-29 DIAGNOSIS — M545 Low back pain: Secondary | ICD-10-CM | POA: Diagnosis not present

## 2012-08-31 ENCOUNTER — Ambulatory Visit: Payer: Medicare Other | Admitting: Internal Medicine

## 2012-09-05 DIAGNOSIS — I10 Essential (primary) hypertension: Secondary | ICD-10-CM | POA: Diagnosis not present

## 2012-09-06 DIAGNOSIS — M545 Low back pain: Secondary | ICD-10-CM | POA: Diagnosis not present

## 2012-09-07 ENCOUNTER — Other Ambulatory Visit: Payer: Self-pay | Admitting: Specialist

## 2012-09-07 DIAGNOSIS — Z950 Presence of cardiac pacemaker: Secondary | ICD-10-CM | POA: Diagnosis not present

## 2012-09-07 DIAGNOSIS — M4800 Spinal stenosis, site unspecified: Secondary | ICD-10-CM

## 2012-09-07 DIAGNOSIS — I495 Sick sinus syndrome: Secondary | ICD-10-CM | POA: Diagnosis not present

## 2012-09-13 ENCOUNTER — Ambulatory Visit (INDEPENDENT_AMBULATORY_CARE_PROVIDER_SITE_OTHER): Payer: Medicare Other | Admitting: Internal Medicine

## 2012-09-13 ENCOUNTER — Encounter: Payer: Self-pay | Admitting: Internal Medicine

## 2012-09-13 VITALS — BP 142/58 | HR 79 | Ht 67.0 in | Wt 249.4 lb

## 2012-09-13 DIAGNOSIS — J449 Chronic obstructive pulmonary disease, unspecified: Secondary | ICD-10-CM

## 2012-09-13 DIAGNOSIS — G4733 Obstructive sleep apnea (adult) (pediatric): Secondary | ICD-10-CM | POA: Diagnosis not present

## 2012-09-13 NOTE — Progress Notes (Signed)
04/10/11- 79 year old male former smoker, Veteran, who had been a patient of mine in the old [employer]. He is coming to reestablish after moving back from [place] to [place]. History of asthma, COPD, obstructive sleep apnea complicated by DM, obesity, CAD/ischemic CM/sick sinus/bradycardia tachycardia with pacemaker, history of prostate cancer, anemia. He reports feeling stable today. New carpet smell in his apartment is irritating to his breathing but he denies routine cough or recent bronchitis. He has not felt the need to use his bronchodilators in 4 months. Walking distances limited more by degenerative joint hip pain. He can walk a maximum of 10 minutes on level ground and very little on hills or stairs. He denies blood, adenopathy, discolored sputum or swelling. He usually has trace ankle edema with no history of venous thromboembolic disease. CAD treated with CABG, stent and pacemaker but he has not had infarction. Did have pneumonia at age 62. He declines flu vaccine. Has had pneumonia vaccine. Long-term diagnosis of sleep apnea. NPSG 01/27/06-[facility] 59.9 per hour. CPAP 12 plus oxygen 2 L for sleep, with good compliance and control. He will want to change to a local provider.  07/14/11- 79 year old male former smoker, Veteran,  History of asthma, COPD, obstructive sleep apnea complicated by DM, obesity, CAD/ischemic CM/sick sinus/bradycardia tachycardia with pacemaker, history of prostate cancer, anemia He had his first flu vaccine in 10 years this winter and avoided any significant respiratory infection. He no longer thinks he needs daytime oxygen. He is wearing it only at night, with his CPAP. Scheduled for right hip replacement in March. Chest x-ray 04/15/2011-no acute process, pacemaker PFT 04/23/2011-mild COPD  08/14/11-  79 year old male former smoker, Veteran,  History of asthma, COPD, obstructive sleep apnea complicated by DM, obesity, CAD/ischemic CM/sick sinus/bradycardia tachycardia/ pacemaker,  history of prostate cancer, anemia He is pending hip replacement by Dr. Tonita Cong on March 15. Asks prior authorization. I explained his increased risk of cardiopulmonary complications from any surgery and recognition that he is probably stable and near his best function now for necessary surgery. He is trying to move from his current apartment to a smoke free facility. A neighbor smokes heavily and the tobacco odor comes into the air conditioning ducts. He continues good compliance and symptom control using CPAP 12 with O2 2 L every night. He will use those settings while he is at the hospital for sleep and in recovery after extubation.  10/26/11- 79 year old male former smoker, Veteran,  History of asthma, COPD, obstructive sleep apnea complicated by DM, obesity, CAD/ischemic CM/sick sinus/bradycardia tachycardia/ pacemaker, history of prostate cancer, anemia Pt states having a productive cough  yellowish brown ,,increase sob wheezing ,chest congestion, cold chills .  Had right total hip replacement and finished physical therapy. Pain still limits walking. No respiratory complications. Now for 3 days had increased cough, yellow sputum, shortness of breath, wheeze, cold chills. Continued reflux symptoms on Nexium once daily. Discussion of steroid therapy and side effects.  11/05/11- 79 year old male former smoker, Veteran,  History of asthma, COPD, obstructive sleep apnea complicated by DM, obesity, CAD/ischemic CM/sick sinus/bradycardia tachycardia/ pacemaker, history of prostate cancer, anemia ACUTE VISIT: SOB and cough-productive yellow in color.Would like Zpak. Seen 10 days ago and Depo-Medrol and Z-Pak did help at that visit. Comfortable with CPAP. He takes it off to cough.  01/04/12- 79 year old male former smoker, Veteran,  History of asthma, COPD, obstructive sleep apnea complicated by DM, obesity, CAD/ischemic CM/sick sinus/bradycardia tachycardia/ pacemaker, history of prostate cancer,  anemia. Needs recert for O2 at night per  [facility]; pt states he feels like he needs O2 during the day as well. He says he sleeps and feels better when  using oxygen. Continue CPAP 12/Advanced, with oxygen at 2 L currently. COPD assessment test (CAT) 31/40.  03/07/12- 79 year old male former smoker, Veteran,  History of asthma, COPD, obstructive sleep apnea complicated by DM, obesity, CAD/ischemic CM/sick sinus/bradycardia tachycardia/ pacemaker, history of prostate cancer, anemia. Has good and bad days; able to move through the home with O2 (3L) on. Has had flu vaccine He qualified for home oxygen and uses it 90% of the time. Needs to wear it to clean his apartment. CPAP 12 "can't sleep without it". Walks laps in parking lot for exercise.  09/13/12- 79-year-old male former smoker, Veteran,  History of asthma, COPD, obstructive sleep apnea complicated by DM, obesity, CAD/ischemic CM/sick sinus/bradycardia tachycardia/ pacemaker, history of prostate cancer, anemia. Follows For:  Requalify for O2 for [facility] -  Pt doesnt feel like he needs O2 - SOB with increased housework activity - PT had fall 2 weeks ago and hurt back - Scheduled for CT this week Isn't sure he needs portable oxygen but does have concentrator in his house for sleep and as needed. He can't sleep without CPAP 12/ APS all night, every night, O2 2L/ Advanced. CXR 11/05/11 IMPRESSION:  Bibasilar bronchiectasis with new associated nodular air space  disease, indicative of an infectious bronchiolitis or  bronchopneumonia.  Original Report Authenticated By: Reyes Ivan, M.D.   ROS-see HPI Constitutional:   No-   weight loss, night sweats, fevers, +chills, fatigue, lassitude. HEENT:   No-  headaches, difficulty swallowing, tooth/dental problems, sore throat,       No-  sneezing, itching, ear ache, nasal congestion, post nasal drip,  CV:  No- recent  chest pain, orthopnea, PND. Mild chronic swelling in lower extremities, No- anasarca,  dizziness, palpitations Resp: +  shortness of breath with exertion or at rest.              No- productive cough,  + non-productive cough,  No- coughing up of blood.              No- change in color of mucus.  No- wheezing.   Skin: No-   rash or lesions. GI:  No-   heartburn, indigestion, abdominal pain, nausea, vomiting,  GU:  MS: . Limiting hip pain Neuro-     nothing unusual Psych:  No- change in mood or affect. No depression or anxiety.  No memory loss.  OBJ General- Alert, Oriented, Affect-appropriate, Distress- none acute, overweight, O2 2 L Skin- rash-none, lesions- none, excoriation- none Lymphadenopathy- none Head- atraumatic            Eyes- Gross vision intact, PERRLA, conjunctivae clear secretions            Ears- Hearing aids, HOH            Nose- Clear, no-Septal dev, mucus, polyps, erosion, perforation             Throat- Mallampati II , mucosa clear , drainage- none, tonsils- atrophic Neck- flexible , trachea midline, no stridor , thyroid nl, carotid no bruit Chest - symmetrical excursion , unlabored           Heart/CV- RRR with skipped beats , no murmur , no gallop  , no rub, nl s1 s2                           -  JVD- none , edema- none, stasis changes- none, varices- none           Lung- +faint crackles persist, unlabored, wheeze- none, + light cough,   dullness-none, rub- none. Raspy laugh.           Chest wall- pacemaker left chest Abd- Br/ Gen/ Rectal- Not done, not indicated Extrem- cyanosis- none, clubbing, none, atrophy- none, strength- deconditioned, cane Neuro- grossly intact to observation

## 2012-09-13 NOTE — Patient Instructions (Addendum)
Ok to continue CPAP 12/ APS  Ok to continue O2  2 L for sleep and as needed during the day,  at 2L

## 2012-09-15 ENCOUNTER — Ambulatory Visit
Admission: RE | Admit: 2012-09-15 | Discharge: 2012-09-15 | Disposition: A | Discharge status: Home / Self Care | Payer: Medicare Other | Source: Ambulatory Visit | Attending: Specialist | Admitting: Specialist

## 2012-09-15 ENCOUNTER — Inpatient Hospital Stay
Admission: RE | Admit: 2012-09-15 | Discharge: 2012-09-15 | Disposition: A | Discharge status: Home / Self Care | Payer: Self-pay | Source: Ambulatory Visit | Attending: Specialist | Admitting: Specialist

## 2012-09-15 ENCOUNTER — Other Ambulatory Visit: Payer: Self-pay | Admitting: Specialist

## 2012-09-15 VITALS — BP 143/69 | HR 59

## 2012-09-15 DIAGNOSIS — M549 Dorsalgia, unspecified: Secondary | ICD-10-CM

## 2012-09-15 DIAGNOSIS — M4800 Spinal stenosis, site unspecified: Secondary | ICD-10-CM

## 2012-09-15 DIAGNOSIS — M545 Low back pain: Secondary | ICD-10-CM | POA: Diagnosis not present

## 2012-09-15 DIAGNOSIS — IMO0002 Reserved for concepts with insufficient information to code with codable children: Secondary | ICD-10-CM | POA: Diagnosis not present

## 2012-09-15 MED ORDER — IOHEXOL 180 MG/ML  SOLN
15.0000 mL | Freq: Once | INTRAMUSCULAR | Status: AC | PRN
  Administered 2012-09-15: 15 mL via INTRATHECAL

## 2012-09-15 MED ORDER — HYDROCODONE-ACETAMINOPHEN 5-325 MG PO TABS
1.0000 | ORAL_TABLET | Freq: Once | ORAL | Status: AC
  Administered 2012-09-15: 1 via ORAL

## 2012-09-15 NOTE — Progress Notes (Signed)
Pt states has been off plavix for the past 5 days and off zoloft for the past 2 days.

## 2012-09-20 ENCOUNTER — Ambulatory Visit (INDEPENDENT_AMBULATORY_CARE_PROVIDER_SITE_OTHER): Payer: Medicare Other | Admitting: Internal Medicine

## 2012-09-20 ENCOUNTER — Encounter: Payer: Self-pay | Admitting: Internal Medicine

## 2012-09-20 VITALS — BP 108/50 | HR 68 | Ht 65.5 in | Wt 246.2 lb

## 2012-09-20 DIAGNOSIS — R143 Flatulence: Secondary | ICD-10-CM

## 2012-09-20 DIAGNOSIS — R141 Gas pain: Secondary | ICD-10-CM | POA: Diagnosis not present

## 2012-09-20 DIAGNOSIS — Z9181 History of falling: Secondary | ICD-10-CM

## 2012-09-20 DIAGNOSIS — R1033 Periumbilical pain: Secondary | ICD-10-CM | POA: Diagnosis not present

## 2012-09-20 DIAGNOSIS — R296 Repeated falls: Secondary | ICD-10-CM

## 2012-09-20 DIAGNOSIS — IMO0001 Reserved for inherently not codable concepts without codable children: Secondary | ICD-10-CM

## 2012-09-20 DIAGNOSIS — R142 Eructation: Secondary | ICD-10-CM | POA: Diagnosis not present

## 2012-09-20 MED ORDER — OMEPRAZOLE-SODIUM BICARBONATE 40-1100 MG PO CAPS: 1.0000 | ORAL_CAPSULE | Freq: Every day | ORAL | Status: DC

## 2012-09-20 NOTE — Progress Notes (Signed)
  Subjective:    Patient ID: Eric [name], male    DOB: March 17, 1927, 79 y.o.   MRN: [id number]  HPI The patient is here for followup. He has tried two round of metronidazole as well as probiotics for bloating and gas and periumbilical abdominal pain. He says he sleeps fine using his CPAP. When he awakens in the morning his periumbilical area and upper abdomen is sore. It gets better after about an hour. He is clearly less distressed over this. Unfortunately he is falling and is injured his back and his left knee.  Medications, allergies, past medical history, past surgical history, family history and social history are reviewed and updated in the EMR.  Review of Systems As above    Objective:   Physical Exam Obese elderly man in no acute distress Abdomen is obese soft and nontender Moderately large ecchymosis of the left knee       Assessment & Plan:   1. Periumbilical abdominal pain   2. Gas   3. Falls frequently    1. Given his recurrent partial small bowel obstruction issues that may have something to do with his problems. Fortunately he is better. I also think Nexium side effects with gas and abdominal pain could be a problem. 2. Allergies Zegerid 40 mg at bedtime as to the Nexium to see if that 3. Is followup with Dr. Felicity Coyer her this month is planned. He has had some significant anxiety and panic comorbidities. He says he is not suffering confusion or side effects whenever he takes alprazolam. He clearly has gait issues which worsened according to him after he had a right hip replacement so that maybe the fall issue. The last fall was related to going out of his car at night to retrieve his wallet he couldn't see well. He may benefit from an obstruction about safer habits to reduce risk of falls. 4. He can see me as needed otherwise, he is to call back with results of the trial of Zegerid samples. He has been on Nexium for 5 years, the [place] apparently supplies that had a  relatively cheap Price.  CC: Rene Paci, MD

## 2012-09-20 NOTE — Assessment & Plan Note (Signed)
He continues with excellent compliance and he is very satisfied.

## 2012-09-20 NOTE — Assessment & Plan Note (Signed)
He desaturates with exertion. He is appropriate to continue oxygen for sleep. We discussed use of portable oxygen with activity.

## 2012-09-20 NOTE — Patient Instructions (Addendum)
Today we are giving you samples of Zegerid , take one capsule at bedtime.  Use this instead of Nexium and see if this helps with your gas.  Thank you for choosing me and [facility].  Iva Boop, M.D., [facility]

## 2012-09-30 ENCOUNTER — Ambulatory Visit (INDEPENDENT_AMBULATORY_CARE_PROVIDER_SITE_OTHER): Payer: Medicare Other | Admitting: Internal Medicine

## 2012-09-30 ENCOUNTER — Encounter: Payer: Self-pay | Admitting: Internal Medicine

## 2012-09-30 ENCOUNTER — Ambulatory Visit: Payer: Medicare Other | Admitting: Internal Medicine

## 2012-09-30 ENCOUNTER — Other Ambulatory Visit (INDEPENDENT_AMBULATORY_CARE_PROVIDER_SITE_OTHER): Payer: Medicare Other

## 2012-09-30 VITALS — BP 142/62 | HR 57 | Temp 97.0°F | Wt 249.8 lb

## 2012-09-30 DIAGNOSIS — E119 Type 2 diabetes mellitus without complications: Secondary | ICD-10-CM

## 2012-09-30 DIAGNOSIS — H9190 Unspecified hearing loss, unspecified ear: Secondary | ICD-10-CM | POA: Diagnosis not present

## 2012-09-30 DIAGNOSIS — F419 Anxiety disorder, unspecified: Secondary | ICD-10-CM

## 2012-09-30 DIAGNOSIS — F411 Generalized anxiety disorder: Secondary | ICD-10-CM | POA: Diagnosis not present

## 2012-09-30 LAB — MICROALBUMIN / CREATININE URINE RATIO
Creatinine,U: 67.7 mg/dL
Microalb Creat Ratio: 1.2 mg/g (ref 0.0–30.0)

## 2012-09-30 NOTE — Assessment & Plan Note (Signed)
Diet controlled, no longer follows with endo (prev kerr) actos caused edema/fluid retention Intolerant of metformin due to nausea and "hospitalization"  Lab Results  Component Value Date   HGBA1C 7.2* 05/30/2012

## 2012-09-30 NOTE — Patient Instructions (Signed)
It was good to see you today. We have reviewed your prior records including labs and tests today Test(s) ordered today. Your results will be released to MyChart (or called to you) after review, usually within 72hours after test completion. If any changes need to be made, you will be notified at that same time. we'll make referral to [facility] audiology as scheduled for 10/27/12 appointment. Our office will contact you regarding appointment(s) once made. Medications reviewed and updated, no changes recommended at this time. Please schedule followup in 6 months, call sooner if problems.

## 2012-09-30 NOTE — Progress Notes (Signed)
Subjective:    Patient ID: Eric [name], male    DOB: November 11, 1926, 79 y.o.   MRN: [id number]  HPI  Here for follow up - reviewed chronic medical issues and interval events:  Recent falls early 2014 reviewed - working with ortho on same for gait disorder related to spinal stenosis - injections with Dr Glenford Bayley for same reviewed. Events complicating chronic pain in low back pain and DDD;  Prior hx falls early 2011 but improved until now (spring 2014) follwing starting steroid shots (ramos). Also R hip DJD, s/p injection and THA 08/2011 - much improved, but transient relief  CAD, hx CABG and multiple PTCA/DES - hosp 08/2010 reviewed with resolution of shoulder pain following DES. Follows with local cards ([facility]) - no residual CP/neck or shoulder symptoms  - the patient reports compliance with medication(s) as prescribed. Denies adverse side effects.  COPD - frustrated by 24/7 O2 requirements since 12/2011 - previously QHS only with OSA  DM2, diet controlled -  Previously stopped Actos due to fluid retention and rash- Prev followed with endo, kerr q45mo- but off all meds including metformin so follows DM here checks sugars infreq - only goes over 120 if eat something sweet   HTN - reports compliance with ongoing medical treatment and no changes in medication dose or frequency. denies adverse side effects related to current therapy.   dyslipidemia - on statin - reports compliance with ongoing medical treatment and no changes in medication dose or frequency. denies adverse side effects related to current therapy.   GERD - reports compliance with ongoing medical treatment and no changes in medication dose or frequency. denies adverse side effects related to current therapy.   Anxiety -"nerves" started low dose prn xanax rx 01/2011 - initially improved, but less effective now associated with "mad spells" and anger flares - Spells cause a "head-flush sensation" and increase in  acid/indigestion Hx same summer 2012, precipitated by move to [facility] senior apt complex Summer 2013 increase symptoms: precipitated by traffic/driving and neighbors "short fuse" but no violent tendency or fights Started ssri 12/2011 for same - initially improved - fewer anger spells initially, but increasing irritability fall 2013    Past Medical History  Diagnosis Date  . LACTOSE INTOLERANCE   . OBESITY   . CARDIOMYOPATHY, ISCHEMIC   . AORTIC SCLEROSIS   . SICK SINUS/ TACHY-BRADY SYNDROME 09/2007    s/p PPM st judes  . PERIPHERAL VASCULAR DISEASE   . CAROTID BRUIT, RIGHT 02/27/2008  . IBS (irritable bowel syndrome)   . ALLERGIC RHINITIS   . ANEMIA-NOS   . OA (osteoarthritis)   . COPD   . GERD   . HYPERLIPIDEMIA   . HIATAL HERNIA   . Diverticulosis   . Prostate cancer     seed implants 2004  . DIABETES MELLITUS-TYPE II     diet controlled  . CORONARY ARTERY DISEASE     CABG 1995, PTCA/DES 2008, 2009 and 08/2010  . HYPERTENSION   . Asthma   . Sleep apnea   . Partial small bowel obstruction     Review of Systems  Constitutional: Negative for fever and fatigue.  Respiratory: Negative for cough, shortness of breath and wheezing.   Cardiovascular: Negative for chest pain, palpitations and leg swelling.      Objective:   Physical Exam  BP 142/62  Pulse 57  Temp(Src) 97 F (36.1 C) (Oral)  Wt 249 lb 12.8 oz (113.309 kg)  BMI 40.92 kg/m2  SpO2 91% Wt Readings  from Last 3 Encounters:  09/30/12 249 lb 12.8 oz (113.309 kg)  09/20/12 246 lb 4 oz (111.698 kg)  09/13/12 249 lb 6.4 oz (113.127 kg)   Constitutional:  Overweight, but appears well-developed and well-nourished. No distress at rest- ambulates with cane asst.  Neck: thick and short, Normal range of motion. Neck supple. No JVD present. No thyromegaly present.  Cardiovascular: Normal rate, regular rhythm and normal heart sounds.  No murmur heard. trace BLE edema Pulmonary/Chest: Effort normal and breath sounds normal.  No respiratory distress. no wheezes.  Neurological: he is alert and oriented to person, place, and time. No cranial nerve deficit. Coordination normal.  Psychiatric: he has a mildly anxious/nervous mood and affect. behavior is normal. Judgment and thought content normal.      Lab Results  Component Value Date   WBC 7.1 06/25/2012   HGB 13.2 06/25/2012   HCT 39.6 06/25/2012   PLT 152 06/25/2012   CHOL 106 05/30/2012   TRIG 114.0 05/30/2012   HDL 28.70* 05/30/2012   ALT 13 06/25/2012   AST 21 06/25/2012   NA 134* 06/25/2012   K 4.0 06/25/2012   CL 100 06/25/2012   CREATININE 0.78 06/25/2012   BUN 15 06/25/2012   CO2 22 06/25/2012   TSH 1.19 04/28/2010   PSA 0.02* 09/10/2006   INR 1.00 08/13/2011   HGBA1C 7.2* 05/30/2012   MICROALBUR 0.5 04/18/2009    Assessment & Plan:  See problem list. Medications and labs reviewed today.  Hearing problems - referred by ENT Jenne Pane to see [facility] audiology specialist to consider implant - needs PCP referral for same - order done

## 2012-09-30 NOTE — Assessment & Plan Note (Signed)
Hx same 01/2011 precipitated by move to [place] and senior appt complex- on prn xanax to help with panic attacks and started low dose sertraline 12/2011 Initially improved - but increasing anger spells and irritability fall 2013 Titrated up 04/2012, but no significant change Also uses prn alprazolam infrequently Again risks and benefits discussed including prn use vs and scheduled use of meds - pt understands and agrees Verified no SI/HI

## 2012-10-11 DIAGNOSIS — M545 Low back pain: Secondary | ICD-10-CM | POA: Diagnosis not present

## 2012-10-12 ENCOUNTER — Telehealth: Payer: Self-pay | Admitting: Internal Medicine

## 2012-10-12 DIAGNOSIS — H04129 Dry eye syndrome of unspecified lacrimal gland: Secondary | ICD-10-CM | POA: Diagnosis not present

## 2012-10-12 DIAGNOSIS — H02059 Trichiasis without entropian unspecified eye, unspecified eyelid: Secondary | ICD-10-CM | POA: Diagnosis not present

## 2012-10-12 DIAGNOSIS — Z961 Presence of intraocular lens: Secondary | ICD-10-CM | POA: Diagnosis not present

## 2012-10-12 DIAGNOSIS — H179 Unspecified corneal scar and opacity: Secondary | ICD-10-CM | POA: Diagnosis not present

## 2012-10-12 MED ORDER — PREDNISONE 20 MG PO TABS: 20.0000 mg | ORAL_TABLET | Freq: Every day | ORAL | Status: DC

## 2012-10-12 NOTE — Telephone Encounter (Signed)
I spoke with pt. He c/o lots of coughing w/ yellow phlem, wheezing and increase SOB x Monday. He has been doing his nebs. He has clarithromycin on hand but is getting ready to expire in 2 days. Pt is requesting further recs. Please advise Dr. Maple Hudson thanks Last OV 09/13/12 Pending 03/16/13  Allergies  Allergen Reactions  . Actos (Pioglitazone Hydrochloride) Other (See Comments)    "felt funny, drowsy, and weak":  . Celebrex (Celecoxib) Other (See Comments)    "felt funny"  . Demerol Other (See Comments)    Increased BP  . Morphine And Related Nausea And Vomiting  . Ciprofloxacin     REACTION: Arthralgia  . Metformin Nausea And Vomiting  . Zocor (Simvastatin)     Makes pt very drowsy

## 2012-10-12 NOTE — Telephone Encounter (Signed)
i spoke with pt and is aware of recs. Nothing further was needed

## 2012-10-12 NOTE — Telephone Encounter (Signed)
If this seems like an infection to him, he can go ahead with the clarithromycin. It will be good for at least another month.  If he thinks this is a pollen problem then we can try prednisone 20 mg, # 5, 1 daily x 5 days.

## 2012-10-18 LAB — HEPATIC FUNCTION PANEL
ALT: 21 U/L (ref 10–40)
AST: 10 U/L — AB (ref 14–40)
Alkaline Phosphatase: 73 U/L (ref 25–125)

## 2012-10-18 LAB — BASIC METABOLIC PANEL
BUN: 21 mg/dL (ref 4–21)
Creatinine: 0.7 mg/dL (ref 0.6–1.3)
Glucose: 135 mg/dL

## 2012-10-18 LAB — LIPID PANEL: Cholesterol: 99 mg/dL (ref 0–200)

## 2012-10-18 LAB — TSH: TSH: 0.9 u[IU]/mL (ref 0.41–5.90)

## 2012-10-19 ENCOUNTER — Ambulatory Visit: Payer: Medicare Other | Admitting: Internal Medicine

## 2012-10-20 ENCOUNTER — Telehealth: Payer: Self-pay | Admitting: Internal Medicine

## 2012-10-20 NOTE — Telephone Encounter (Signed)
Message copied by Arna Snipe on Thu Oct 20, 2012  9:25 AM ------      Message from: Jessee Avers      Created: Wed Oct 19, 2012  4:45 PM      Regarding: FW: Pt fell in tub while getting ready for appt-Cant walk/Cx appt at 11:30am today       Do not bill patient      ----- Message -----         From: Arna Snipe         Sent: 10/19/2012  10:06 AM           To: Patti E [place], CMA      Subject: Pt fell in tub while getting ready for appt-#                   ------

## 2012-10-21 DIAGNOSIS — M169 Osteoarthritis of hip, unspecified: Secondary | ICD-10-CM | POA: Diagnosis not present

## 2012-10-21 DIAGNOSIS — M76899 Other specified enthesopathies of unspecified lower limb, excluding foot: Secondary | ICD-10-CM | POA: Diagnosis not present

## 2012-10-24 ENCOUNTER — Ambulatory Visit: Payer: Medicare Other | Admitting: Internal Medicine

## 2012-10-27 ENCOUNTER — Encounter: Payer: Self-pay | Admitting: Internal Medicine

## 2012-10-29 ENCOUNTER — Ambulatory Visit (INDEPENDENT_AMBULATORY_CARE_PROVIDER_SITE_OTHER): Payer: Medicare Other | Admitting: Family Medicine

## 2012-10-29 ENCOUNTER — Ambulatory Visit: Payer: Medicare Other | Admitting: Family Medicine

## 2012-10-29 ENCOUNTER — Encounter: Payer: Self-pay | Admitting: Family Medicine

## 2012-10-29 VITALS — BP 130/60 | HR 60 | Temp 97.8°F | Wt 251.0 lb

## 2012-10-29 DIAGNOSIS — IMO0002 Reserved for concepts with insufficient information to code with codable children: Secondary | ICD-10-CM

## 2012-10-29 DIAGNOSIS — S50312A Abrasion of left elbow, initial encounter: Secondary | ICD-10-CM

## 2012-10-29 DIAGNOSIS — S8010XA Contusion of unspecified lower leg, initial encounter: Secondary | ICD-10-CM

## 2012-10-29 DIAGNOSIS — S8012XA Contusion of left lower leg, initial encounter: Secondary | ICD-10-CM

## 2012-10-29 NOTE — Progress Notes (Signed)
Date:  10/29/2012   Name:  Eric [name]   DOB:  [birth year]   MRN:  [id number] Gender: male Age: 79 y.o.  Primary Physician:  Rene Paci, MD  Evaluating MD: Hannah Beat, MD  Chief Complaint: Fall and Abrasion   History of Present Illness:  Eric [name] is a 79 y.o. very pleasant male patient who presents with the following:  Elderly gentleman on Plavix:  Fell today. 5th fall in the last year. Uses a walker, hit his elbow and his knee on the LEFT. He is on Plavix, and has had a great deal of bleeding from the L elbow that has not stopped yet. He is still walking and moving all extremities without difficulty. He did not strike his head and has normal mentation. No effusion and normal ROM all joints.   Anterior R knee also has some pain and hematoma formation. No effusion.   L elbow and L knee.     Past Medical History, Surgical History, Social History, Family History, Problem List, Medications, and Allergies have been reviewed and updated if relevant.  Current Outpatient Prescriptions on File Prior to Visit  Medication Sig Dispense Refill  . albuterol (PROVENTIL HFA) 108 (90 BASE) MCG/ACT inhaler Inhale 2 puffs into the lungs every 4 (four) hours as needed. Wheezing       . albuterol (PROVENTIL) (2.5 MG/3ML) 0.083% nebulizer solution Take 3 mLs (2.5 mg total) by nebulization every 4 (four) hours as needed. Wheezing  75 mL  5  . albuterol-ipratropium (COMBIVENT) 18-103 MCG/ACT inhaler Inhale 2 puffs into the lungs every 6 (six) hours as needed. Wheezing       . ALPRAZolam (XANAX) 0.5 MG tablet Take 1 tablet (0.5 mg total) by mouth 3 (three) times daily as needed for anxiety.  30 tablet  2  . aluminum & magnesium hydroxide (MAALOX) 225-200 MG/5ML suspension Take 15 mLs by mouth every 6 (six) hours as needed. Heart burn       . amLODipine (NORVASC) 5 MG tablet Take 0.5 tablets (2.5 mg total) by mouth daily.      . bumetanide (BUMEX) 1 MG tablet Take 1 mg by  mouth daily after breakfast.       . clopidogrel (PLAVIX) 75 MG tablet Take 75 mg by mouth daily after breakfast.       . fluticasone (FLONASE) 50 MCG/ACT nasal spray Place 2 sprays into the nose daily as needed. Allergies       . HYDROcodone-acetaminophen (NORCO) 7.5-325 MG per tablet Take 1-2 tablets by mouth every 4 (four) hours as needed.      . isosorbide mononitrate (IMDUR) 60 MG 24 hr tablet 60 mg BID per pt since last visit with Dr Katrinka Blazing      . Niacin CR 1000 MG TBCR Take 1,000 mg by mouth at bedtime.       . nitroGLYCERIN (NITROSTAT) 0.4 MG SL tablet Place 0.4 mg under the tongue every 5 (five) minutes as needed. Chest pain       . omeprazole-sodium bicarbonate (ZEGERID) 40-1100 MG per capsule Take 1 capsule by mouth daily before breakfast.  30 capsule  0  . Polyethyl Glycol-Propyl Glycol (SYSTANE) 0.4-0.3 % SOLN Apply 2 drops to eye at bedtime.      . polyethylene glycol (MIRALAX / GLYCOLAX) packet Take 17 g by mouth daily as needed. Constipation       . pravastatin (PRAVACHOL) 20 MG tablet Take 60 mg by mouth at bedtime.       [name]  predniSONE (DELTASONE) 20 MG tablet Take 1 tablet (20 mg total) by mouth daily.  5 tablet  0  . senna (SENOKOT) 8.6 MG tablet Alternates with one tablet one day then two tablets the next day      . sertraline (ZOLOFT) 50 MG tablet Take 1 tablet (50 mg total) by mouth daily.  30 tablet  5  . valsartan (DIOVAN) 160 MG tablet Take 160 mg by mouth 2 (two) times daily.        No current facility-administered medications on file prior to visit.    Review of Systems:  GEN: No fevers, chills. Nontoxic. Primarily MSK c/o today. MSK: Detailed in the HPI GI: tolerating PO intake without difficulty Neuro: No numbness, parasthesias, or tingling associated. Otherwise the pertinent positives of the ROS are noted above.    Physical Examination: There were no vitals taken for this visit.   GEN: WDWN, NAD, Non-toxic, Alert & Oriented x 3 HEENT: Atraumatic,  Normocephalic.  Ears and Nose: No external deformity. EXTR: No clubbing/cyanosis/edema NEURO: antalgic gait.  PSYCH: Normally interactive. Conversant. Not depressed or anxious appearing.  Calm demeanor.   L elbow Ecchymosis or edema: diffuse ecchymosis approx 7 cm across. Open 3 cm wound on elbow, slowly bleeding.  ROM: full flexion, extension, pronation, supination Shoulder ROM: Full Flexion: 5/5 Extension: 5/5 Supination: 5/5  Pronation: 5/5 Wrist ext: 5/5 Wrist flexion: 5/5 No gross bony abnormality -  ALL BONY ANATOMY NT Varus and Valgus stress: stable ECRB tenderness: neg Medial epicondyle: NT Lateral epicondyle, resisted wrist extension from wrist full pronation and flexion: NT grip: 5/5 sensation intact  Knee:  L Gait: Normal heel toe pattern, but antalgia with left ROM: 0-125 Effusion: neg Echymosis or edema: none Patellar tendon none, but inferior to this HEMATOMA FORMATION THAT IS TENDER Painful PLICA: neg Patellar grind: negative Medial and lateral patellar facet loading: negative medial and lateral joint lines:NT Mcmurray's neg Flexion-pinch not attempted Varus and valgus stress: stable Lachman: neg Ant and Post drawer: neg Hip abduction, IR, ER: WNL Hip flexion str: 5/5 Hip abd: 5/5 Quad: 5/5 VMO atrophy: mild Hamstring concentric and eccentric: 5/5   Assessment and Plan: Hematoma of leg, left, initial encounter  Abrasion of elbow, left, initial encounter   >25 minutes spent in face to face time with patient, >50% spent in counselling or coordination of care: Discussed with patient joint examinations appear normal. Motion is normal and there is no obvious area of tenderness near points of impact. Certainly will have pain at hematoma, expect 6 weeks for resolution and bruising post trauma may take 4 weeks to resolve. I explained that our Saturday clinic has limited capabilities with no x-rays available, but I think he is safe to go home. With his age, if  symptoms worsen or are prolonged then full radiographic evaluation is appropriate. He sees Dr. Shelle Iron, so he could f/u with him with concerns.  We also dressed his significant abrasion with Telfa and Coban.  Signed, Elpidio Galea. Rosmery Duggin, MD 10/29/2012 9:58 AM

## 2012-11-04 DIAGNOSIS — H906 Mixed conductive and sensorineural hearing loss, bilateral: Secondary | ICD-10-CM | POA: Diagnosis not present

## 2012-11-09 ENCOUNTER — Encounter: Payer: Self-pay | Admitting: Internal Medicine

## 2012-11-09 ENCOUNTER — Ambulatory Visit (INDEPENDENT_AMBULATORY_CARE_PROVIDER_SITE_OTHER): Payer: Medicare Other | Admitting: Internal Medicine

## 2012-11-09 VITALS — BP 144/70 | HR 60 | Temp 97.6°F | Wt 257.8 lb

## 2012-11-09 DIAGNOSIS — J309 Allergic rhinitis, unspecified: Secondary | ICD-10-CM

## 2012-11-09 MED ORDER — FLUTICASONE PROPIONATE 50 MCG/ACT NA SUSP: 2.0000 | Freq: Every day | NASAL | Status: DC

## 2012-11-09 MED ORDER — LORATADINE 10 MG PO TABS: 10.0000 mg | ORAL_TABLET | Freq: Every day | ORAL | Status: DC

## 2012-11-09 NOTE — Assessment & Plan Note (Signed)
rx antihistamine and nasal steroids - no hx/exam to suggest infection Pt to call if worse or unimproved

## 2012-11-09 NOTE — Patient Instructions (Signed)
It was good to see you today. Flonase and Claritin - Your prescription(s) have been submitted to your pharmacy. Please take as directed and contact our office if you believe you are having problem(s) with the medication(s). Call if symptoms worse or unimproved

## 2012-11-09 NOTE — Progress Notes (Signed)
  Subjective:    Patient ID: Eric [name], male    DOB: 1926/07/02, 79 y.o.   MRN: [id number]  HPI  Here for "bad taste in mouth" Due to PND and sinus drainage Also reviewed chronic medical issues and interval events:   Past Medical History  Diagnosis Date  . LACTOSE INTOLERANCE   . OBESITY   . CARDIOMYOPATHY, ISCHEMIC   . AORTIC SCLEROSIS   . SICK SINUS/ TACHY-BRADY SYNDROME 09/2007    s/p PPM st judes  . PERIPHERAL VASCULAR DISEASE   . CAROTID BRUIT, RIGHT 02/27/2008  . IBS (irritable bowel syndrome)   . ALLERGIC RHINITIS   . ANEMIA-NOS   . OA (osteoarthritis)   . COPD   . GERD   . HYPERLIPIDEMIA   . HIATAL HERNIA   . Diverticulosis   . Prostate cancer     seed implants 2004  . DIABETES MELLITUS-TYPE II     diet controlled  . CORONARY ARTERY DISEASE     CABG 1995, PTCA/DES 2008, 2009 and 08/2010  . HYPERTENSION   . Asthma   . Sleep apnea   . Partial small bowel obstruction     Review of Systems  Constitutional: Negative for fever and fatigue.  Respiratory: Negative for cough, shortness of breath and wheezing.   Cardiovascular: Negative for chest pain, palpitations and leg swelling.      Objective:   Physical Exam  BP 144/70  Pulse 60  Temp(Src) 97.6 F (36.4 C) (Oral)  Wt 257 lb 12.8 oz (116.937 kg)  BMI 42.23 kg/m2  SpO2 94% Wt Readings from Last 3 Encounters:  11/09/12 257 lb 12.8 oz (116.937 kg)  10/29/12 251 lb (113.853 kg)  09/30/12 249 lb 12.8 oz (113.309 kg)   Constitutional:  Overweight, but appears well-developed and well-nourished. No distress at rest- ambulates with cane asst.  HENT: NCAT, sinus non tender - Nose with pale mucosal membrane, thick but discolored DC Neck: thick and short, Normal range of motion. Neck supple. No JVD present. No thyromegaly present.  Cardiovascular: Normal rate, regular rhythm and normal heart sounds.  No murmur heard. trace BLE edema Pulmonary/Chest: Effort normal and breath sounds normal. No respiratory  distress. no wheezes.  Neurological: he is alert and oriented to person, place, and time. No cranial nerve deficit. Coordination normal.  Psychiatric: he has a mildly anxious/nervous mood and affect. behavior is normal. Judgment and thought content normal.      Lab Results  Component Value Date   WBC 7.1 06/25/2012   HGB 13.2 06/25/2012   HCT 39.6 06/25/2012   PLT 152 06/25/2012   CHOL 99 10/18/2012   TRIG 67 10/18/2012   HDL 44 10/18/2012   ALT 21 2/95/6213   AST 10* 10/18/2012   NA 141 10/18/2012   K 4.4 10/18/2012   CL 100 06/25/2012   CREATININE 0.7 10/18/2012   BUN 21 10/18/2012   CO2 22 06/25/2012   TSH 0.90 10/18/2012   PSA 0.02* 09/10/2006   INR 1.00 08/13/2011   HGBA1C 6.9* 09/30/2012   MICROALBUR 0.8 09/30/2012    Assessment & Plan:   See problem list. Medications and labs reviewed today.

## 2012-11-10 ENCOUNTER — Telehealth: Payer: Self-pay | Admitting: *Deleted

## 2012-11-10 NOTE — Telephone Encounter (Signed)
Last progress note faxed to [facility] of [facility] [phone]. Patient requesting to have Omeprazole filled at [place]. Fax confirmation received. Kg.

## 2012-11-11 ENCOUNTER — Encounter: Payer: Self-pay | Admitting: Internal Medicine

## 2012-11-11 ENCOUNTER — Ambulatory Visit (INDEPENDENT_AMBULATORY_CARE_PROVIDER_SITE_OTHER): Payer: Medicare Other | Admitting: Internal Medicine

## 2012-11-11 VITALS — BP 100/70 | HR 78 | Ht 66.0 in | Wt 254.4 lb

## 2012-11-11 DIAGNOSIS — K219 Gastro-esophageal reflux disease without esophagitis: Secondary | ICD-10-CM | POA: Diagnosis not present

## 2012-11-11 DIAGNOSIS — R1033 Periumbilical pain: Secondary | ICD-10-CM | POA: Diagnosis not present

## 2012-11-11 NOTE — Progress Notes (Signed)
  Subjective:    Patient ID: Eric [name], male    DOB: [birth year], 79 y.o.   MRN: [id number]  HPI The patient returns. I had seen in April, I changed from Nexium to Zegerid and his periumbilical pain is gone. He would like refills. He gets his medications to the [place]. He says diarrhea is gone also. Medications, allergies, past medical history, past surgical history, family history and social history are reviewed and updated in the EMR.   Review of Systems He fell and bruised his elbow on Memorial Day. He is having burning in the sinuses and nasal passages.    Objective:   Physical Exam Elderly white man, in no acute distress, chronically ill Multiple ecchymoses on the forearms, he has a bandage on the left elbow     Assessment & Plan:   1. GERD (gastroesophageal reflux disease)   2. Periumbilical abdominal pain    He is improved. We will continue to Zegerid 40 mg daily. Samples are given and I will send this note to the [place] where his primary care physician hopefully will be a will be prescribed this medication. He did not tolerate Nexium. He has been on other PPIs in the past. I think Zegerid, since he is under control is the one for him.  Cc: Darrick Huntsman, MD Fax [phone]  [facility] [place])

## 2012-11-11 NOTE — Patient Instructions (Addendum)
Today we are giving you samples of Zegerid.     We have your [facility] information to send them office notes so that they may refill the medicines you need.   I appreciate the opportunity to care for you.

## 2012-11-21 ENCOUNTER — Telehealth: Payer: Self-pay | Admitting: Internal Medicine

## 2012-11-21 NOTE — Telephone Encounter (Signed)
Ok to take claritin as this is highly unlikely to be related  Consider OV for symptoms

## 2012-11-21 NOTE — Telephone Encounter (Signed)
Patient Information:  Caller Name: Leonell  Phone: [phone]  Patient: Eric [name], Eric [name]  Gender: Male  DOB: [birth year]  Age: 79 Years  PCP: Rene Paci (Adults only)  Office Follow Up:  Does the office need to follow up with this patient?: Yes  Instructions For The Office: I advised him to hold the next dose of Claritan until he hears from Dr. Felicity Coyer.   Symptoms  Reason For Call & Symptoms: Patient calling, has had bitterness in his mouth, facial pain and a h/a for several weeks.  Was seen in the office and instructed to take Claritan 10mg .  Started this 6/4.   Has had some weakness and this am it worsened and had dizziness. His heart rate has been irregular at times.  His sx are worse in the morning, just a few hours after taking the Claritan.  His sx improve throughtout the day.  Reviewed Health History In EMR: Yes  Reviewed Medications In EMR: Yes  Reviewed Allergies In EMR: Yes  Reviewed Surgeries / Procedures: Yes  Date of Onset of Symptoms: 11/20/2012  Guideline(s) Used:  Dizziness  Disposition Per Guideline:   Discuss with PCP and Callback by Nurse Today  Reason For Disposition Reached:   Taking a medicine that could cause dizziness (e.g., blood pressure medications, diuretics)  Advice Given:  N/A  Patient Will Follow Care Advice:  YES

## 2012-11-21 NOTE — Telephone Encounter (Signed)
Please advise in VAL's absence.

## 2012-11-21 NOTE — Telephone Encounter (Signed)
Patient informed. 

## 2012-11-22 ENCOUNTER — Ambulatory Visit: Payer: Medicare Other | Admitting: Internal Medicine

## 2012-11-22 ENCOUNTER — Telehealth: Payer: Self-pay

## 2012-11-22 DIAGNOSIS — Z0289 Encounter for other administrative examinations: Secondary | ICD-10-CM

## 2012-11-22 DIAGNOSIS — Z950 Presence of cardiac pacemaker: Secondary | ICD-10-CM | POA: Diagnosis not present

## 2012-11-22 DIAGNOSIS — I495 Sick sinus syndrome: Secondary | ICD-10-CM | POA: Diagnosis not present

## 2012-11-22 NOTE — Telephone Encounter (Signed)
Patient called lmovm c/o low blood pressure of 109/50 with fatigue. I returned call back to pt who was concerned about low blood pressure and heart rate of 80 (pacemaker set at 60). He states that he called [facility] cardiology Dr. Katrinka Blazing office but has not heard anything back. I obtained their phone number and called, explained situation to nurse and transferred patient through.

## 2012-11-24 ENCOUNTER — Telehealth: Payer: Self-pay | Admitting: Internal Medicine

## 2012-11-24 MED ORDER — AMOXICILLIN-POT CLAVULANATE 875-125 MG PO TABS: 1.0000 | ORAL_TABLET | Freq: Two times a day (BID) | ORAL | Status: DC

## 2012-11-24 NOTE — Telephone Encounter (Signed)
Pt aware of recs. RX called in. Nothing further was needed.  

## 2012-11-24 NOTE — Telephone Encounter (Signed)
Pt has called back & would like an answer asap, please.  Eric [name]

## 2012-11-24 NOTE — Telephone Encounter (Signed)
Per CY-give Augmentin 875 mg #14 take 1 po BID no refills.

## 2012-11-24 NOTE — Telephone Encounter (Signed)
I spoke with pt. C/o watery/itchy eyes at night, facial pressure, HA, nasal congestion/sinus drainage comes and goes. He has been taking Claritin and using flonase. He is requetsing further recs from Dr. Maple Hudson. Please advise thanks  Last OV 09/13/12 Pending 03/16/13  Allergies  Allergen Reactions  . Actos (Pioglitazone Hydrochloride) Other (See Comments)    "felt funny, drowsy, and weak":  . Celebrex (Celecoxib) Other (See Comments)    "felt funny"  . Demerol Other (See Comments)    Increased BP  . Morphine And Related Nausea And Vomiting  . Ciprofloxacin     REACTION: Arthralgia  . Metformin Nausea And Vomiting  . Zocor (Simvastatin)     Makes pt very drowsy

## 2012-11-28 ENCOUNTER — Telehealth: Payer: Self-pay | Admitting: Internal Medicine

## 2012-11-28 NOTE — Telephone Encounter (Signed)
Change to Allegra 180 mg daily, generic and over-the-counter okay

## 2012-11-28 NOTE — Telephone Encounter (Signed)
Called pt no answer LMOM with md response.../lmb 

## 2012-11-28 NOTE — Telephone Encounter (Signed)
Claritin is making him very sick.  Experiencing severe nausea and vomiting.  What to do?

## 2012-11-28 NOTE — Telephone Encounter (Signed)
Patient reports that zegerid that was sent from the [place] were 20 mg tablets.  He is concerned that he can't double up the zegerid to make 40 due to the sodium.  Please advise

## 2012-11-29 NOTE — Telephone Encounter (Signed)
Patient notified of Dr. Jamelle Haring recommendations

## 2012-11-29 NOTE — Telephone Encounter (Signed)
Have him add a Prilosec OTC daily and see if that works

## 2012-12-19 DIAGNOSIS — Z950 Presence of cardiac pacemaker: Secondary | ICD-10-CM | POA: Diagnosis not present

## 2012-12-21 DIAGNOSIS — Z8546 Personal history of malignant neoplasm of prostate: Secondary | ICD-10-CM | POA: Diagnosis not present

## 2012-12-29 ENCOUNTER — Telehealth: Payer: Self-pay | Admitting: Internal Medicine

## 2012-12-29 MED ORDER — DOXYCYCLINE HYCLATE 100 MG PO TABS: ORAL_TABLET | ORAL | Status: DC

## 2012-12-29 NOTE — Telephone Encounter (Signed)
Per CY- doxycycline 100mg  #8 2 today then 1 daily Spoke with patient he is aware and rx sent to verified pharmacy

## 2012-12-29 NOTE — Telephone Encounter (Signed)
Spoke to pt. Reports having a cough with production of yellow mucus and a runny nose x5 days. Denies fever/chills, body aches. Has not tried anything OTC for his sx.  Allergies  Allergen Reactions  . Actos (Pioglitazone Hydrochloride) Other (See Comments)    "felt funny, drowsy, and weak":  . Celebrex (Celecoxib) Other (See Comments)    "felt funny"  . Demerol Other (See Comments)    Increased BP  . Morphine And Related Nausea And Vomiting  . Ciprofloxacin     REACTION: Arthralgia  . Metformin Nausea And Vomiting  . Zocor (Simvastatin)     Makes pt very drowsy   Last OV 4.8.2014 Pending OV 10.9.2014  CY - please advise. Thanks.

## 2013-01-09 DIAGNOSIS — N529 Male erectile dysfunction, unspecified: Secondary | ICD-10-CM | POA: Diagnosis not present

## 2013-01-09 DIAGNOSIS — Z8546 Personal history of malignant neoplasm of prostate: Secondary | ICD-10-CM | POA: Diagnosis not present

## 2013-01-09 DIAGNOSIS — H908 Mixed conductive and sensorineural hearing loss, unspecified: Secondary | ICD-10-CM | POA: Diagnosis not present

## 2013-01-09 DIAGNOSIS — H7200 Central perforation of tympanic membrane, unspecified ear: Secondary | ICD-10-CM | POA: Diagnosis not present

## 2013-01-09 DIAGNOSIS — Z87440 Personal history of urinary (tract) infections: Secondary | ICD-10-CM | POA: Diagnosis not present

## 2013-01-23 DIAGNOSIS — J019 Acute sinusitis, unspecified: Secondary | ICD-10-CM | POA: Diagnosis not present

## 2013-01-23 DIAGNOSIS — H7200 Central perforation of tympanic membrane, unspecified ear: Secondary | ICD-10-CM | POA: Diagnosis not present

## 2013-01-23 DIAGNOSIS — H908 Mixed conductive and sensorineural hearing loss, unspecified: Secondary | ICD-10-CM | POA: Diagnosis not present

## 2013-01-24 DIAGNOSIS — M545 Low back pain: Secondary | ICD-10-CM | POA: Diagnosis not present

## 2013-02-02 ENCOUNTER — Encounter: Payer: Self-pay | Admitting: Internal Medicine

## 2013-02-02 ENCOUNTER — Other Ambulatory Visit (INDEPENDENT_AMBULATORY_CARE_PROVIDER_SITE_OTHER): Payer: Medicare Other

## 2013-02-02 ENCOUNTER — Ambulatory Visit (INDEPENDENT_AMBULATORY_CARE_PROVIDER_SITE_OTHER): Payer: Medicare Other | Admitting: Internal Medicine

## 2013-02-02 ENCOUNTER — Ambulatory Visit (INDEPENDENT_AMBULATORY_CARE_PROVIDER_SITE_OTHER)
Admission: RE | Admit: 2013-02-02 | Discharge: 2013-02-02 | Disposition: A | Discharge status: Home / Self Care | Payer: Medicare Other | Source: Ambulatory Visit | Attending: Internal Medicine | Admitting: Internal Medicine

## 2013-02-02 VITALS — BP 118/60 | HR 65 | Temp 97.7°F | Wt 256.0 lb

## 2013-02-02 DIAGNOSIS — W19XXXA Unspecified fall, initial encounter: Secondary | ICD-10-CM

## 2013-02-02 DIAGNOSIS — R5381 Other malaise: Secondary | ICD-10-CM | POA: Diagnosis not present

## 2013-02-02 DIAGNOSIS — R05 Cough: Secondary | ICD-10-CM

## 2013-02-02 DIAGNOSIS — R627 Adult failure to thrive: Secondary | ICD-10-CM

## 2013-02-02 DIAGNOSIS — I2510 Atherosclerotic heart disease of native coronary artery without angina pectoris: Secondary | ICD-10-CM

## 2013-02-02 DIAGNOSIS — R0602 Shortness of breath: Secondary | ICD-10-CM | POA: Diagnosis not present

## 2013-02-02 DIAGNOSIS — F411 Generalized anxiety disorder: Secondary | ICD-10-CM | POA: Diagnosis not present

## 2013-02-02 DIAGNOSIS — Z23 Encounter for immunization: Secondary | ICD-10-CM | POA: Diagnosis not present

## 2013-02-02 DIAGNOSIS — R059 Cough, unspecified: Secondary | ICD-10-CM

## 2013-02-02 DIAGNOSIS — F419 Anxiety disorder, unspecified: Secondary | ICD-10-CM

## 2013-02-02 DIAGNOSIS — W19XXXD Unspecified fall, subsequent encounter: Secondary | ICD-10-CM

## 2013-02-02 DIAGNOSIS — E210 Primary hyperparathyroidism: Secondary | ICD-10-CM

## 2013-02-02 DIAGNOSIS — J019 Acute sinusitis, unspecified: Secondary | ICD-10-CM | POA: Diagnosis not present

## 2013-02-02 DIAGNOSIS — R51 Headache: Secondary | ICD-10-CM | POA: Diagnosis not present

## 2013-02-02 LAB — CBC WITH DIFFERENTIAL/PLATELET
Basophils Absolute: 0 10*3/uL (ref 0.0–0.1)
Eosinophils Absolute: 0.1 10*3/uL (ref 0.0–0.7)
Eosinophils Relative: 1 % (ref 0.0–5.0)
HCT: 36.4 % — ABNORMAL LOW (ref 39.0–52.0)
Lymphs Abs: 0.5 10*3/uL — ABNORMAL LOW (ref 0.7–4.0)
MCHC: 33.7 g/dL (ref 30.0–36.0)
MCV: 98 fl (ref 78.0–100.0)
Monocytes Absolute: 1 10*3/uL (ref 0.1–1.0)
Neutrophils Relative %: 84.9 % — ABNORMAL HIGH (ref 43.0–77.0)
Platelets: 184 10*3/uL (ref 150.0–400.0)
RDW: 13 % (ref 11.5–14.6)
WBC: 10.5 10*3/uL (ref 4.5–10.5)

## 2013-02-02 NOTE — Progress Notes (Signed)
Subjective:    Patient ID: Eric [name], male    DOB: 1926-09-07, 79 y.o.   MRN: [id number]  HPI Here for weakness, generalized associated with a fall at home ?6 weeks ago -  Frontal headache and increased sinus pressure - saw ENT for same this AM, then referred here for further eval of symptoms To start antibiotics, but has not yet begun same  Also reviewed chronic medical issues and interval events:   Past Medical History  Diagnosis Date  . LACTOSE INTOLERANCE   . OBESITY   . CARDIOMYOPATHY, ISCHEMIC   . AORTIC SCLEROSIS   . SICK SINUS/ TACHY-BRADY SYNDROME 09/2007    s/p PPM st judes  . PERIPHERAL VASCULAR DISEASE   . CAROTID BRUIT, RIGHT 02/27/2008  . IBS (irritable bowel syndrome)   . ALLERGIC RHINITIS   . ANEMIA-NOS   . OA (osteoarthritis)   . COPD   . GERD   . HYPERLIPIDEMIA   . HIATAL HERNIA   . Diverticulosis   . Prostate cancer     seed implants 2004  . DIABETES MELLITUS-TYPE II     diet controlled  . CORONARY ARTERY DISEASE     CABG 1995, PTCA/DES 2008, 2009 and 08/2010  . HYPERTENSION   . Asthma   . Sleep apnea   . Partial small bowel obstruction     Review of Systems  Constitutional: Positive for fatigue. Negative for fever and unexpected weight change.  Respiratory: Positive for cough. Negative for shortness of breath and wheezing.   Cardiovascular: Negative for chest pain, palpitations and leg swelling.  Neurological: Positive for weakness and headaches. Negative for dizziness and light-headedness.  Psychiatric/Behavioral: Positive for dysphoric mood and decreased concentration. Negative for confusion and self-injury. The patient is not nervous/anxious.       Objective:   Physical Exam BP 118/60  Pulse 65  Temp(Src) 97.7 F (36.5 C) (Oral)  Wt 256 lb (116.121 kg)  BMI 41.34 kg/m2  SpO2 94% Wt Readings from Last 3 Encounters:  02/02/13 256 lb (116.121 kg)  11/11/12 254 lb 6 oz (115.384 kg)  11/09/12 257 lb 12.8 oz (116.937 kg)    Constitutional:  Overweight, but appears well-developed and well-nourished. No distress at rest- ambulates with cane asst.  HENT: NCAT, frontal sinus non tender - Nose with pale mucosal membrane, thick but discolored DC Neck: thick and short, Normal range of motion. Neck supple. No JVD present. No thyromegaly present.  Cardiovascular: Normal rate, regular rhythm and normal heart sounds.  No murmur heard. trace BLE edema Pulmonary/Chest: Effort normal and breath sounds diminished at bases. No respiratory distress. no wheezes.  Neurological: he is alert and oriented to person, place, and time. No cranial nerve deficit. Coordination and gait with cane assistance, slow and cautious but appropriate.  Psychiatric: he has a moderately depressed/anxious mood and affect. behavior is normal. Judgment and insight mildly impaired.      Lab Results  Component Value Date   WBC 7.1 06/25/2012   HGB 13.2 06/25/2012   HCT 39.6 06/25/2012   PLT 152 06/25/2012   CHOL 99 10/18/2012   TRIG 67 10/18/2012   HDL 44 10/18/2012   ALT 21 09/14/8117   AST 10* 10/18/2012   NA 141 10/18/2012   K 4.4 10/18/2012   CL 100 06/25/2012   CREATININE 0.7 10/18/2012   BUN 21 10/18/2012   CO2 22 06/25/2012   TSH 0.90 10/18/2012   PSA 0.02* 09/10/2006   INR 1.00 08/13/2011   HGBA1C 6.9* 09/30/2012  MICROALBUR 0.8 09/30/2012    Assessment & Plan:   FTT/weakness generalized -progressive over past months CHI? - hx fall with head impact 6 weeks ago - ?SDH - pt attributes balance problems to carpet in His apartment, but unwilling to consider move to ALF or other supervised setting Cough, dry -  Depression and anxiety, subop controlled -?underlying cognitive impairment  recommended to check CT head, but ?ct sinus just done this AM per pt report so he declines same check screening labs and CXR refer for [facility] -pt/ot/aide I strongly recommended pt consider move to ALF for supervision and asst, but he adamantly refuses to leave his apt whee  he lives alone

## 2013-02-02 NOTE — Patient Instructions (Signed)
It was good to see you today. We have reviewed your prior records including labs and tests today Test(s) ordered today. Your results will be released to MyChart (or called to you) after review, usually within 72hours after test completion. If any changes need to be made, you will be notified at that same time. Medications reviewed and updated, no changes recommended at this time. we'll make referral for home health therapy to arrange physical therapy, nursing check and aide assistance. Our office will contact you regarding appointment(s) once made. I strongly consider you moving to an assisted living facility or some place for you have supervision and help. I do not think it is safe for you to continue living alone Please schedule followup in 3-4 months, call sooner if problems.

## 2013-02-03 ENCOUNTER — Telehealth: Payer: Self-pay | Admitting: *Deleted

## 2013-02-03 LAB — HEPATIC FUNCTION PANEL
Alkaline Phosphatase: 57 U/L (ref 39–117)
Bilirubin, Direct: 0.1 mg/dL (ref 0.0–0.3)
Total Bilirubin: 0.7 mg/dL (ref 0.3–1.2)
Total Protein: 7.3 g/dL (ref 6.0–8.3)

## 2013-02-03 LAB — BASIC METABOLIC PANEL
BUN: 28 mg/dL — ABNORMAL HIGH (ref 6–23)
Chloride: 103 mEq/L (ref 96–112)
Creatinine, Ser: 0.9 mg/dL (ref 0.4–1.5)
Glucose, Bld: 104 mg/dL — ABNORMAL HIGH (ref 70–99)

## 2013-02-03 NOTE — Telephone Encounter (Signed)
Notified pt with md response.../lmb 

## 2013-02-03 NOTE — Telephone Encounter (Signed)
mucinex bid - and continue augmentin as rx'd by ENT for sinus

## 2013-02-03 NOTE — Telephone Encounter (Signed)
Pt call back concerning xray results. He states he don't understand why he is coughing up thick yellowish phelgm when his cxr was clear. Is there anything md advise for him to take...Raechel Chute

## 2013-02-06 DIAGNOSIS — I2510 Atherosclerotic heart disease of native coronary artery without angina pectoris: Secondary | ICD-10-CM | POA: Diagnosis not present

## 2013-02-06 DIAGNOSIS — E119 Type 2 diabetes mellitus without complications: Secondary | ICD-10-CM | POA: Diagnosis not present

## 2013-02-06 DIAGNOSIS — Z602 Problems related to living alone: Secondary | ICD-10-CM | POA: Diagnosis not present

## 2013-02-06 DIAGNOSIS — J449 Chronic obstructive pulmonary disease, unspecified: Secondary | ICD-10-CM | POA: Diagnosis not present

## 2013-02-06 DIAGNOSIS — G4730 Sleep apnea, unspecified: Secondary | ICD-10-CM | POA: Diagnosis not present

## 2013-02-06 DIAGNOSIS — R627 Adult failure to thrive: Secondary | ICD-10-CM | POA: Diagnosis not present

## 2013-02-06 DIAGNOSIS — W19XXXA Unspecified fall, initial encounter: Secondary | ICD-10-CM | POA: Diagnosis not present

## 2013-02-06 DIAGNOSIS — F329 Major depressive disorder, single episode, unspecified: Secondary | ICD-10-CM | POA: Diagnosis not present

## 2013-02-08 ENCOUNTER — Telehealth: Payer: Self-pay | Admitting: *Deleted

## 2013-02-08 DIAGNOSIS — I2510 Atherosclerotic heart disease of native coronary artery without angina pectoris: Secondary | ICD-10-CM | POA: Diagnosis not present

## 2013-02-08 DIAGNOSIS — M5137 Other intervertebral disc degeneration, lumbosacral region: Secondary | ICD-10-CM | POA: Diagnosis not present

## 2013-02-08 DIAGNOSIS — J449 Chronic obstructive pulmonary disease, unspecified: Secondary | ICD-10-CM | POA: Diagnosis not present

## 2013-02-08 DIAGNOSIS — G4730 Sleep apnea, unspecified: Secondary | ICD-10-CM | POA: Diagnosis not present

## 2013-02-08 DIAGNOSIS — F329 Major depressive disorder, single episode, unspecified: Secondary | ICD-10-CM | POA: Diagnosis not present

## 2013-02-08 DIAGNOSIS — R627 Adult failure to thrive: Secondary | ICD-10-CM | POA: Diagnosis not present

## 2013-02-08 DIAGNOSIS — E119 Type 2 diabetes mellitus without complications: Secondary | ICD-10-CM | POA: Diagnosis not present

## 2013-02-08 DIAGNOSIS — M48061 Spinal stenosis, lumbar region without neurogenic claudication: Secondary | ICD-10-CM | POA: Diagnosis not present

## 2013-02-08 NOTE — Telephone Encounter (Signed)
Md is wanting pt to come back to have PTH done. Placing order in epic...lmb

## 2013-02-13 ENCOUNTER — Other Ambulatory Visit: Payer: Medicare Other

## 2013-02-13 DIAGNOSIS — E119 Type 2 diabetes mellitus without complications: Secondary | ICD-10-CM | POA: Diagnosis not present

## 2013-02-13 DIAGNOSIS — J449 Chronic obstructive pulmonary disease, unspecified: Secondary | ICD-10-CM | POA: Diagnosis not present

## 2013-02-13 DIAGNOSIS — R627 Adult failure to thrive: Secondary | ICD-10-CM | POA: Diagnosis not present

## 2013-02-13 DIAGNOSIS — G4730 Sleep apnea, unspecified: Secondary | ICD-10-CM | POA: Diagnosis not present

## 2013-02-13 DIAGNOSIS — F329 Major depressive disorder, single episode, unspecified: Secondary | ICD-10-CM | POA: Diagnosis not present

## 2013-02-13 DIAGNOSIS — I2510 Atherosclerotic heart disease of native coronary artery without angina pectoris: Secondary | ICD-10-CM | POA: Diagnosis not present

## 2013-02-14 ENCOUNTER — Ambulatory Visit: Payer: Medicare Other | Admitting: Internal Medicine

## 2013-02-14 ENCOUNTER — Telehealth: Payer: Self-pay | Admitting: Internal Medicine

## 2013-02-14 DIAGNOSIS — R627 Adult failure to thrive: Secondary | ICD-10-CM | POA: Diagnosis not present

## 2013-02-14 DIAGNOSIS — J449 Chronic obstructive pulmonary disease, unspecified: Secondary | ICD-10-CM | POA: Diagnosis not present

## 2013-02-14 DIAGNOSIS — I2510 Atherosclerotic heart disease of native coronary artery without angina pectoris: Secondary | ICD-10-CM | POA: Diagnosis not present

## 2013-02-14 DIAGNOSIS — F329 Major depressive disorder, single episode, unspecified: Secondary | ICD-10-CM | POA: Diagnosis not present

## 2013-02-14 DIAGNOSIS — G4730 Sleep apnea, unspecified: Secondary | ICD-10-CM | POA: Diagnosis not present

## 2013-02-14 DIAGNOSIS — E119 Type 2 diabetes mellitus without complications: Secondary | ICD-10-CM | POA: Diagnosis not present

## 2013-02-14 LAB — PTH, INTACT AND CALCIUM
Calcium: 11 mg/dL — ABNORMAL HIGH (ref 8.4–10.5)
PTH: 150.7 pg/mL — ABNORMAL HIGH (ref 14.0–72.0)

## 2013-02-14 NOTE — Telephone Encounter (Signed)
Patient Information:  Caller Name: Aydn  Phone: [phone]  Patient: Eric [name], Eric [name]  Gender: Male  DOB: Nov 15, 1926  Age: 79 Years  PCP: Rene Paci (Adults only)  Office Follow Up:  Does the office need to follow up with this patient?: No  Instructions For The Office: N/A  RN Note:  Patient pulled a tick off his left ankle 02/14/13.  States there was a red area which has resolved.  States was able to get the tick's head out of the skin.  Patient describes tick as very small like a poppy seed.  Per tick bite protocol, emergent symptoms denied; advised for home care, with callback parameters given.  krs/can  Symptoms  Reason For Call & Symptoms: tick bite  Reviewed Health History In EMR: Yes  Reviewed Medications In EMR: Yes  Reviewed Allergies In EMR: Yes  Reviewed Surgeries / Procedures: Yes  Date of Onset of Symptoms: 02/14/2013  Guideline(s) Used:  Tick Bite  Disposition Per Guideline:   Home Care  Reason For Disposition Reached:   Tick bite with no complications  Advice Given:  Antibiotic Ointment:  Wash the wound and your hands with soap and water after removal to prevent catching any tick disease. Apply an over-the-counter antibiotic ointment (e.g., bacitracin) to the bite once.  Expected Course:  Tick bites normally do not itch or hurt. That is why they often go unnoticed.  Call Back If:  Fever or rash occur in the next 2 weeks  Bite begins to look infected  You become worse.  Patient Will Follow Care Advice:  YES

## 2013-02-15 ENCOUNTER — Encounter: Payer: Self-pay | Admitting: Internal Medicine

## 2013-02-15 DIAGNOSIS — J449 Chronic obstructive pulmonary disease, unspecified: Secondary | ICD-10-CM | POA: Diagnosis not present

## 2013-02-15 DIAGNOSIS — E119 Type 2 diabetes mellitus without complications: Secondary | ICD-10-CM | POA: Diagnosis not present

## 2013-02-15 DIAGNOSIS — G4730 Sleep apnea, unspecified: Secondary | ICD-10-CM | POA: Diagnosis not present

## 2013-02-15 DIAGNOSIS — R627 Adult failure to thrive: Secondary | ICD-10-CM | POA: Diagnosis not present

## 2013-02-15 DIAGNOSIS — I2510 Atherosclerotic heart disease of native coronary artery without angina pectoris: Secondary | ICD-10-CM | POA: Diagnosis not present

## 2013-02-15 DIAGNOSIS — E210 Primary hyperparathyroidism: Secondary | ICD-10-CM | POA: Insufficient documentation

## 2013-02-15 DIAGNOSIS — F329 Major depressive disorder, single episode, unspecified: Secondary | ICD-10-CM | POA: Diagnosis not present

## 2013-02-15 NOTE — Addendum Note (Signed)
Addended by: Rene Paci A on: 02/15/2013 05:12 PM   Modules accepted: Orders

## 2013-02-16 ENCOUNTER — Telehealth: Payer: Self-pay | Admitting: Internal Medicine

## 2013-02-16 ENCOUNTER — Ambulatory Visit: Payer: Medicare Other | Admitting: Internal Medicine

## 2013-02-16 DIAGNOSIS — G4730 Sleep apnea, unspecified: Secondary | ICD-10-CM | POA: Diagnosis not present

## 2013-02-16 DIAGNOSIS — F329 Major depressive disorder, single episode, unspecified: Secondary | ICD-10-CM | POA: Diagnosis not present

## 2013-02-16 DIAGNOSIS — R627 Adult failure to thrive: Secondary | ICD-10-CM | POA: Diagnosis not present

## 2013-02-16 DIAGNOSIS — E119 Type 2 diabetes mellitus without complications: Secondary | ICD-10-CM | POA: Diagnosis not present

## 2013-02-16 DIAGNOSIS — J449 Chronic obstructive pulmonary disease, unspecified: Secondary | ICD-10-CM | POA: Diagnosis not present

## 2013-02-16 DIAGNOSIS — I2510 Atherosclerotic heart disease of native coronary artery without angina pectoris: Secondary | ICD-10-CM | POA: Diagnosis not present

## 2013-02-16 NOTE — Telephone Encounter (Signed)
Spoke with Dr. Felicity Coyer about Eric [name] being sick. Per Dr. Felicity Coyer, if pt call and stat that he still sick and not feeling good, Please advise pt to go to the ER to be check out. Pt was schedule to come in today at 10:45 but pt didn't show up. FYI.

## 2013-02-16 NOTE — Telephone Encounter (Signed)
Patient states he just got a phone call from Dr. Diamantina Monks office and is having problems with his thyroid and would like to leave the appt as scheduled.  He will call back if he wants to be worked in with one of the extenders for this or next week.

## 2013-02-17 DIAGNOSIS — R627 Adult failure to thrive: Secondary | ICD-10-CM | POA: Diagnosis not present

## 2013-02-17 DIAGNOSIS — F329 Major depressive disorder, single episode, unspecified: Secondary | ICD-10-CM | POA: Diagnosis not present

## 2013-02-17 DIAGNOSIS — I2510 Atherosclerotic heart disease of native coronary artery without angina pectoris: Secondary | ICD-10-CM | POA: Diagnosis not present

## 2013-02-17 DIAGNOSIS — J449 Chronic obstructive pulmonary disease, unspecified: Secondary | ICD-10-CM | POA: Diagnosis not present

## 2013-02-17 DIAGNOSIS — E119 Type 2 diabetes mellitus without complications: Secondary | ICD-10-CM | POA: Diagnosis not present

## 2013-02-17 DIAGNOSIS — G4730 Sleep apnea, unspecified: Secondary | ICD-10-CM | POA: Diagnosis not present

## 2013-02-20 ENCOUNTER — Telehealth: Payer: Self-pay | Admitting: *Deleted

## 2013-02-20 DIAGNOSIS — G4730 Sleep apnea, unspecified: Secondary | ICD-10-CM | POA: Diagnosis not present

## 2013-02-20 DIAGNOSIS — F329 Major depressive disorder, single episode, unspecified: Secondary | ICD-10-CM | POA: Diagnosis not present

## 2013-02-20 DIAGNOSIS — R627 Adult failure to thrive: Secondary | ICD-10-CM | POA: Diagnosis not present

## 2013-02-20 DIAGNOSIS — E119 Type 2 diabetes mellitus without complications: Secondary | ICD-10-CM | POA: Diagnosis not present

## 2013-02-20 DIAGNOSIS — J449 Chronic obstructive pulmonary disease, unspecified: Secondary | ICD-10-CM | POA: Diagnosis not present

## 2013-02-20 DIAGNOSIS — I2510 Atherosclerotic heart disease of native coronary artery without angina pectoris: Secondary | ICD-10-CM | POA: Diagnosis not present

## 2013-02-20 NOTE — Telephone Encounter (Signed)
Left msg on vm requesting verbal order for a medical SW. Pls advice since md out of office...Raechel Chute

## 2013-02-20 NOTE — Telephone Encounter (Signed)
Called [place] no answer LMOM with regina response...lmb

## 2013-02-20 NOTE — Telephone Encounter (Signed)
ok 

## 2013-02-21 DIAGNOSIS — E119 Type 2 diabetes mellitus without complications: Secondary | ICD-10-CM | POA: Diagnosis not present

## 2013-02-21 DIAGNOSIS — G4730 Sleep apnea, unspecified: Secondary | ICD-10-CM | POA: Diagnosis not present

## 2013-02-21 DIAGNOSIS — I2510 Atherosclerotic heart disease of native coronary artery without angina pectoris: Secondary | ICD-10-CM | POA: Diagnosis not present

## 2013-02-21 DIAGNOSIS — F329 Major depressive disorder, single episode, unspecified: Secondary | ICD-10-CM | POA: Diagnosis not present

## 2013-02-21 DIAGNOSIS — R627 Adult failure to thrive: Secondary | ICD-10-CM | POA: Diagnosis not present

## 2013-02-21 DIAGNOSIS — J449 Chronic obstructive pulmonary disease, unspecified: Secondary | ICD-10-CM | POA: Diagnosis not present

## 2013-02-22 DIAGNOSIS — H02059 Trichiasis without entropian unspecified eye, unspecified eyelid: Secondary | ICD-10-CM | POA: Diagnosis not present

## 2013-02-22 DIAGNOSIS — H579 Unspecified disorder of eye and adnexa: Secondary | ICD-10-CM | POA: Diagnosis not present

## 2013-02-22 DIAGNOSIS — H01009 Unspecified blepharitis unspecified eye, unspecified eyelid: Secondary | ICD-10-CM | POA: Diagnosis not present

## 2013-02-23 DIAGNOSIS — R627 Adult failure to thrive: Secondary | ICD-10-CM | POA: Diagnosis not present

## 2013-02-23 DIAGNOSIS — F329 Major depressive disorder, single episode, unspecified: Secondary | ICD-10-CM | POA: Diagnosis not present

## 2013-02-23 DIAGNOSIS — G4730 Sleep apnea, unspecified: Secondary | ICD-10-CM | POA: Diagnosis not present

## 2013-02-23 DIAGNOSIS — J449 Chronic obstructive pulmonary disease, unspecified: Secondary | ICD-10-CM | POA: Diagnosis not present

## 2013-02-23 DIAGNOSIS — E119 Type 2 diabetes mellitus without complications: Secondary | ICD-10-CM | POA: Diagnosis not present

## 2013-02-23 DIAGNOSIS — I2510 Atherosclerotic heart disease of native coronary artery without angina pectoris: Secondary | ICD-10-CM | POA: Diagnosis not present

## 2013-02-24 ENCOUNTER — Telehealth: Payer: Self-pay | Admitting: Internal Medicine

## 2013-02-24 DIAGNOSIS — F329 Major depressive disorder, single episode, unspecified: Secondary | ICD-10-CM | POA: Diagnosis not present

## 2013-02-24 DIAGNOSIS — J449 Chronic obstructive pulmonary disease, unspecified: Secondary | ICD-10-CM

## 2013-02-24 DIAGNOSIS — G4730 Sleep apnea, unspecified: Secondary | ICD-10-CM | POA: Diagnosis not present

## 2013-02-24 DIAGNOSIS — R627 Adult failure to thrive: Secondary | ICD-10-CM | POA: Diagnosis not present

## 2013-02-24 DIAGNOSIS — I2510 Atherosclerotic heart disease of native coronary artery without angina pectoris: Secondary | ICD-10-CM | POA: Diagnosis not present

## 2013-02-24 DIAGNOSIS — E119 Type 2 diabetes mellitus without complications: Secondary | ICD-10-CM | POA: Diagnosis not present

## 2013-02-24 NOTE — Telephone Encounter (Signed)
OK - Order DME [facility] humidifier for O2 concentrator

## 2013-02-24 NOTE — Telephone Encounter (Signed)
Spoke with patient-- Patient states his throat gets really dry with his o2 concentrator Patient is requesting humidifier for concentrator Dr. Maple Hudson please advise, thank you! DME: [facility]   Last OV 09/13/12 Next OV 03/16/13

## 2013-02-24 NOTE — Telephone Encounter (Signed)
Spoke with patient, made him aware that humidifier will be ordered Patient verbalized understanding and nothing further needed at this time

## 2013-02-26 DIAGNOSIS — H02059 Trichiasis without entropian unspecified eye, unspecified eyelid: Secondary | ICD-10-CM | POA: Diagnosis not present

## 2013-02-26 DIAGNOSIS — H5710 Ocular pain, unspecified eye: Secondary | ICD-10-CM | POA: Diagnosis not present

## 2013-02-26 DIAGNOSIS — T1510XA Foreign body in conjunctival sac, unspecified eye, initial encounter: Secondary | ICD-10-CM | POA: Diagnosis not present

## 2013-02-27 ENCOUNTER — Encounter: Payer: Self-pay | Admitting: Endocrinology

## 2013-02-27 ENCOUNTER — Ambulatory Visit (INDEPENDENT_AMBULATORY_CARE_PROVIDER_SITE_OTHER): Payer: Medicare Other | Admitting: Endocrinology

## 2013-02-27 VITALS — BP 120/44 | HR 67 | Temp 98.2°F | Ht 66.0 in | Wt 252.3 lb

## 2013-02-27 DIAGNOSIS — E119 Type 2 diabetes mellitus without complications: Secondary | ICD-10-CM

## 2013-02-27 DIAGNOSIS — E213 Hyperparathyroidism, unspecified: Secondary | ICD-10-CM

## 2013-02-27 DIAGNOSIS — E875 Hyperkalemia: Secondary | ICD-10-CM | POA: Diagnosis not present

## 2013-02-27 LAB — BASIC METABOLIC PANEL
Calcium: 10.5 mg/dL (ref 8.4–10.5)
Chloride: 102 mEq/L (ref 96–112)
GFR: 57.96 mL/min — ABNORMAL LOW (ref >60.00)
Glucose, Bld: 110 mg/dL — ABNORMAL HIGH (ref 70–99)
Potassium: 4.4 mEq/L (ref 3.5–5.1)
Sodium: 135 mEq/L (ref 135–145)

## 2013-02-27 NOTE — Patient Instructions (Addendum)
Avoid bananas and canteloupes

## 2013-02-27 NOTE — Progress Notes (Signed)
Patient ID: Eric [name], male   DOB: Jan 15, 1927, 79 y.o.   MRN:    Chief complaint: High calcium  History of Present Illness:  The patient had been complaining of fatigability for the last 4 months. He would normally get tired after being up in the morning for 3-4 hours. However he thinks he is better now and can be active till at least the afternoon. Does not know how this has changed   Review of records show that she has had a high calcium since 1/14 but borderline high levels last year also He does not consume any extra calcium in the form of supplements or TUMS and does not take any vitamin D supplements. Does not drink large amounts of milk  The hypercalcemia is not associated with any pathologic fractures, renal insufficiency, nephrolithiasis, sarcoidosis, known carcinoma, thyroid disease.   Prior serologic have included PTH level but no vitamin D level  Lab Results  Component Value Date   CALCIUM 11.0* 02/13/2013   CALCIUM 11.0* 02/02/2013   CALCIUM 10.9* 06/25/2012   CALCIUM 10.3 08/25/2011   CALCIUM 10.2 08/24/2011    Lab Results  Component Value Date   PTH 150.7* 02/13/2013   CALCIUM 11.0* 02/13/2013   CAION 1.21 03/15/2008      REVIEW of systems:   He has had severe long-standing COPD, taking oxygen continuously   DIABETES: He has had mild diabetes for the last few years with last A1c 7.2. He is recently trying to lose weight with cutting back on  Bread and eating more vegetables. He thinks his morning sugar recently has been about 165 but is better after meals.  was taken off actos because of edema; Intolerant of metformin due to nausea  History of pedal edema, currently on Bumex. Not on potassium at this time  He has history of chronic low back pain, more on lying down flat   Wt Readings from Last 3 Encounters:  02/27/13 252 lb 4.8 oz (114.443 kg)  02/02/13 256 lb (116.121 kg)  11/11/12 254 lb 6 oz (115.384 kg)    EXAM:  BP 120/44  Pulse 67  Temp(Src)  98.2 F (36.8 C) (Oral)  Wt 252 lb 4.8 oz (114.443 kg)  BMI 40.74 kg/m2  SpO2 95%  GENERAL: Averagely built and has generalized obesity  No pallor,lymphadenopathy or edema. Mild clubbing of fingernails present.  Skin:  no rash or pigmentation.  EYES:  Externally normal.  Fundii:  normal discs and vessels.  ENT: Oral mucosa and tongue normal.  THYROID:  Not palpable. No other mass palpable in the neck  HEART:  Heart sounds are relatively distant, appears to have S1 and S2; no murmur or click.  CHEST:  Normal shape Lungs:   Vescicular breath sounds heard equally.  No crepitations/ wheeze.  ABDOMEN:  No distention.  Liver and spleen not palpable.  No other mass or tenderness.  NEUROLOGICAL: .Reflexes are normal bilaterally at ankles.  SPINE AND JOINTS:  Normal.  Extremities: No edema  Assessment/Plan:   HYPERCALCEMIA:  He has primary hyperparathyroidism with increased PTH. Calcium is currently 11.0 and most likely he is asymptomatic from this mild elevation. It was about the same in 1/14 Do not feel that his fatigue is related to hypercalcemia as he has multiple other medical problems. He thinks his fatigue is relatively better recently   Also has no obvious end organ effects like osteoporosis and no history of nephrolithiasis. Also currently has normal renal function. Because of his age and  severe COPD he is not a surgical candidate unless hypercalcemia is severe and symptomatic Reassured him that he does not need any intervention for this at present and at the most he should  be evaluated and treated for osteoporosis by his PCP. May be a candidate for a bisphosphonate like Reclast if he has osteoporosis  HYPERKALEMIA: This is likely to be from reduced renal blood flow and hyporeninemic hyperaldosteronism especially with taking Diovan which can be reduced by PCP if needed. Potassium will be checked today and he was advised on low potassium diet  Diabetes with obesity: He is  somewhat confused about his diet and probably not getting balanced meals. He will be referred for diabetes education. Appears to have an A1c reasonably good for his age and comorbid conditions without any pharmacological treatment   ADDENDUM: Labs show normal potassium and his calcium is upper normal at 10.5, to followup with PCP

## 2013-02-28 ENCOUNTER — Encounter: Payer: Medicare Other | Attending: Endocrinology | Admitting: Nutrition

## 2013-02-28 ENCOUNTER — Telehealth: Payer: Self-pay | Admitting: Internal Medicine

## 2013-02-28 ENCOUNTER — Telehealth: Payer: Self-pay | Admitting: Endocrinology

## 2013-02-28 DIAGNOSIS — J449 Chronic obstructive pulmonary disease, unspecified: Secondary | ICD-10-CM

## 2013-02-28 MED ORDER — GLUCOSE BLOOD VI STRP: 1.0000 | ORAL_STRIP | Freq: Two times a day (BID) | Status: DC

## 2013-02-28 NOTE — Telephone Encounter (Signed)
I spoke with the pt and he states he is going to [place] this weekend to visit a sick friend and he needs to have oxygen to take with him. He states he has portable oxygen but it only lasts 4 hours so he will need something while he is there. I spoke with Melissa and she states we just need to place an order stating this and they will send it to resp therapy and they will contact the pt to get this set-up.  Order has been placed and the pt is aware. Carron Curie, CMA

## 2013-02-28 NOTE — Telephone Encounter (Signed)
Spoke with patient and he states that his glucose is now back to normal at 124.  He also said he has been using expired test strips.  A new Rx sent to pharmacy.

## 2013-03-01 ENCOUNTER — Telehealth: Payer: Self-pay | Admitting: *Deleted

## 2013-03-01 MED ORDER — SITAGLIPTIN PHOSPHATE 50 MG PO TABS: 50.0000 mg | ORAL_TABLET | Freq: Every day | ORAL | Status: DC

## 2013-03-01 NOTE — Telephone Encounter (Signed)
Pt saw you on the 22nd and he said for the last couple of days his sugar has been high, today it was 195 after a cup of coffee with creamer, no sugar.  Yesterday it was 173 fasting, he is very concerned about having a stroke due to the high sugars.

## 2013-03-01 NOTE — Telephone Encounter (Signed)
His high blood sugars will not cause stroke as overall control is fairly good. He can start taking Januvia 50 mg daily, needs to followup with PCP

## 2013-03-01 NOTE — Telephone Encounter (Signed)
Pt says now that he believes his symptoms are also caused because his upstairs neighbors are smoking marijuana and he thinks that maybe why his eyes are burning and says he feels better when he's been gone for for an hour or so.

## 2013-03-06 ENCOUNTER — Telehealth: Payer: Self-pay | Admitting: *Deleted

## 2013-03-06 NOTE — Telephone Encounter (Signed)
Spoke with pt scheduled appoint for 10.10.14 at 1:30pm

## 2013-03-06 NOTE — Telephone Encounter (Signed)
Pt called states he does not want to return to Dr Lucianne Muss.  Pt states MD does not listen to him.  Please advise

## 2013-03-06 NOTE — Telephone Encounter (Signed)
Please schedule office visit here with me to review concerns ( please give me 30 minutes)

## 2013-03-09 ENCOUNTER — Telehealth: Payer: Self-pay | Admitting: Internal Medicine

## 2013-03-09 NOTE — Telephone Encounter (Signed)
Advised pt that we will place samples up front and to take Pepcid. He will come by in the morning to pick up.

## 2013-03-09 NOTE — Telephone Encounter (Signed)
Per CY-give sample of Dymista 1-2 sprays in each nostril qhs and Try Pepcid OTC 1 daily before breakfast.

## 2013-03-09 NOTE — Telephone Encounter (Signed)
Called and spoke with pt and he stated that he has been having some dizziness x months.  He has been seen by his PCP for the same.  Pt stated that he is on oxygen and he has a bad odor and taste in his mouth.  Pt denies any nasal congestion but feels this may be related to allergies.  He did try claritin but this made him feel worse, so he went and got a [facility] medication and this has not helped him either.  He said that he has lots of slobber in his mouth and its bitter.   Pt is requesting any recs from [facility].    Last ov--10/24/2012 Next ov--03/16/2013   Allergies  Allergen Reactions  . Actos [Pioglitazone Hydrochloride] Other (See Comments)    "felt funny, drowsy, and weak":  . Celebrex [Celecoxib] Other (See Comments)    "felt funny"  . Demerol Other (See Comments)    Increased BP  . Morphine And Related Nausea And Vomiting  . Ciprofloxacin     REACTION: Arthralgia  . Metformin Nausea And Vomiting  . Zocor [Simvastatin]     Makes pt very drowsy

## 2013-03-15 ENCOUNTER — Telehealth: Payer: Self-pay | Admitting: *Deleted

## 2013-03-15 DIAGNOSIS — J449 Chronic obstructive pulmonary disease, unspecified: Secondary | ICD-10-CM | POA: Diagnosis not present

## 2013-03-15 DIAGNOSIS — R627 Adult failure to thrive: Secondary | ICD-10-CM | POA: Diagnosis not present

## 2013-03-15 DIAGNOSIS — I2510 Atherosclerotic heart disease of native coronary artery without angina pectoris: Secondary | ICD-10-CM | POA: Diagnosis not present

## 2013-03-15 DIAGNOSIS — F329 Major depressive disorder, single episode, unspecified: Secondary | ICD-10-CM | POA: Diagnosis not present

## 2013-03-15 DIAGNOSIS — E119 Type 2 diabetes mellitus without complications: Secondary | ICD-10-CM | POA: Diagnosis not present

## 2013-03-15 DIAGNOSIS — G4730 Sleep apnea, unspecified: Secondary | ICD-10-CM | POA: Diagnosis not present

## 2013-03-15 NOTE — Telephone Encounter (Signed)
Left msg on vm stating wanting to let pt know pt is no longer home bound. He drives and goes where he want to go. Will be d/c from home health service...Raechel Chute

## 2013-03-15 NOTE — Telephone Encounter (Signed)
Information update noted. Thanks

## 2013-03-16 ENCOUNTER — Encounter: Payer: Self-pay | Admitting: Internal Medicine

## 2013-03-16 ENCOUNTER — Ambulatory Visit (INDEPENDENT_AMBULATORY_CARE_PROVIDER_SITE_OTHER): Payer: Medicare Other | Admitting: Internal Medicine

## 2013-03-16 VITALS — BP 108/60 | HR 76 | Ht 66.0 in | Wt 252.4 lb

## 2013-03-16 DIAGNOSIS — G4733 Obstructive sleep apnea (adult) (pediatric): Secondary | ICD-10-CM | POA: Diagnosis not present

## 2013-03-16 DIAGNOSIS — J4489 Other specified chronic obstructive pulmonary disease: Secondary | ICD-10-CM

## 2013-03-16 DIAGNOSIS — J449 Chronic obstructive pulmonary disease, unspecified: Secondary | ICD-10-CM | POA: Diagnosis not present

## 2013-03-16 NOTE — Assessment & Plan Note (Signed)
He feels he can't sleep without CPAP and continues to use it all night every night, with oxygen

## 2013-03-16 NOTE — Progress Notes (Signed)
04/10/11- 79 year old male former smoker, Veteran, who had been a patient of mine in the old [employer]. He is coming to reestablish after moving back from [place] to [place]. History of asthma, COPD, obstructive sleep apnea complicated by DM, obesity, CAD/ischemic CM/sick sinus/bradycardia tachycardia with pacemaker, history of prostate cancer, anemia. He reports feeling stable today. New carpet smell in his apartment is irritating to his breathing but he denies routine cough or recent bronchitis. He has not felt the need to use his bronchodilators in 4 months. Walking distances limited more by degenerative joint hip pain. He can walk a maximum of 10 minutes on level ground and very little on hills or stairs. He denies blood, adenopathy, discolored sputum or swelling. He usually has trace ankle edema with no history of venous thromboembolic disease. CAD treated with CABG, stent and pacemaker but he has not had infarction. Did have pneumonia at age 62. He declines flu vaccine. Has had pneumonia vaccine. Long-term diagnosis of sleep apnea. NPSG 01/27/06-[facility] 59.9 per hour. CPAP 12 plus oxygen 2 L for sleep, with good compliance and control. He will want to change to a local provider.  07/14/11- 79 year old male former smoker, Veteran,  History of asthma, COPD, obstructive sleep apnea complicated by DM, obesity, CAD/ischemic CM/sick sinus/bradycardia tachycardia with pacemaker, history of prostate cancer, anemia He had his first flu vaccine in 10 years this winter and avoided any significant respiratory infection. He no longer thinks he needs daytime oxygen. He is wearing it only at night, with his CPAP. Scheduled for right hip replacement in March. Chest x-ray 04/15/2011-no acute process, pacemaker PFT 04/23/2011-mild COPD  08/14/11-  79 year old male former smoker, Veteran,  History of asthma, COPD, obstructive sleep apnea complicated by DM, obesity, CAD/ischemic CM/sick sinus/bradycardia tachycardia/ pacemaker,  history of prostate cancer, anemia He is pending hip replacement by Dr. Tonita Cong on March 15. Asks prior authorization. I explained his increased risk of cardiopulmonary complications from any surgery and recognition that he is probably stable and near his best function now for necessary surgery. He is trying to move from his current apartment to a smoke free facility. A neighbor smokes heavily and the tobacco odor comes into the air conditioning ducts. He continues good compliance and symptom control using CPAP 12 with O2 2 L every night. He will use those settings while he is at the hospital for sleep and in recovery after extubation.  10/26/11- 79 year old male former smoker, Veteran,  History of asthma, COPD, obstructive sleep apnea complicated by DM, obesity, CAD/ischemic CM/sick sinus/bradycardia tachycardia/ pacemaker, history of prostate cancer, anemia Pt states having a productive cough  yellowish brown ,,increase sob wheezing ,chest congestion, cold chills .  Had right total hip replacement and finished physical therapy. Pain still limits walking. No respiratory complications. Now for 3 days had increased cough, yellow sputum, shortness of breath, wheeze, cold chills. Continued reflux symptoms on Nexium once daily. Discussion of steroid therapy and side effects.  11/05/11- 79 year old male former smoker, Veteran,  History of asthma, COPD, obstructive sleep apnea complicated by DM, obesity, CAD/ischemic CM/sick sinus/bradycardia tachycardia/ pacemaker, history of prostate cancer, anemia ACUTE VISIT: SOB and cough-productive yellow in color.Would like Zpak. Seen 10 days ago and Depo-Medrol and Z-Pak did help at that visit. Comfortable with CPAP. He takes it off to cough.  01/04/12- 79 year old male former smoker, Veteran,  History of asthma, COPD, obstructive sleep apnea complicated by DM, obesity, CAD/ischemic CM/sick sinus/bradycardia tachycardia/ pacemaker, history of prostate cancer,  anemia. Needs recert for O2 at night per  [facility]; pt states he feels like he needs O2 during the day as well. He says he sleeps and feels better when  using oxygen. Continue CPAP 12/[facility], with oxygen at 2 L currently. COPD assessment test (CAT) 31/40.  03/07/12- 79 year old male former smoker, Veteran,  History of asthma, COPD, obstructive sleep apnea complicated by DM, obesity, CAD/ischemic CM/sick sinus/bradycardia tachycardia/ pacemaker, history of prostate cancer, anemia. Has good and bad days; able to move through the home with O2 (3L) on. Has had flu vaccine He qualified for home oxygen and uses it 90% of the time. Needs to wear it to clean his apartment. CPAP 12 "can't sleep without it". Walks laps in parking lot for exercise.  09/13/12- 29-year-old male former smoker, Veteran,  History of asthma, COPD, obstructive sleep apnea complicated by DM, obesity, CAD/ischemic CM/sick sinus/bradycardia tachycardia/ pacemaker, history of prostate cancer, anemia. Follows For:  Requalify for O2 for [facility] -  Pt doesnt feel like he needs O2 - SOB with increased housework activity - PT had fall 2 weeks ago and hurt back - Scheduled for CT this week Isn't sure he needs portable oxygen but does have concentrator in his house for sleep and as needed. He can't sleep without CPAP 12/ [facility]PS all night, every night, O2 2L/ Advanced. CXR 11/05/11 IMPRESSION:  Bibasilar bronchiectasis with new associated nodular air space  disease, indicative of an infectious bronchiolitis or  bronchopneumonia.  Original Report Authenticated By: Reyes Ivan, M.D.  03/16/13-  79 year old male former smoker, Veteran,  History of asthma, COPD, obstructive sleep apnea complicated by DM, obesity, CAD/ischemic CM/sick sinus/bradycardia tachycardia/ pacemaker, history of prostate cancer, anemia. CPAP 12 / O2 2L/ APS sleep. Would like not to need O2 for exertion.  needs o2 recertified.  Breathing is unchanged.  Wearing cpap every night.  No  problems with mask or pressure.   Dizzy if he stands quickly and questions blood pressure medicines. Sees Dr Felicity Coyer tomorrow. CXR 02/02/13 IMPRESSION:  No acute abnormalities.  Original Report Authenticated By: Francene Boyers, M.D. Exercise oximetry 03/16/13- rest RA 93%, walking RA 87%, Walking 2L O2 98%.   ROS-see HPI Constitutional:   No-   weight loss, night sweats, fevers, +chills, fatigue, lassitude. HEENT:   No-  headaches, difficulty swallowing, tooth/dental problems, sore throat,       No-  sneezing, itching, ear ache, nasal congestion, post nasal drip,  CV:  No- recent  chest pain, orthopnea, PND. +Mild chronic swelling in lower extremities, No- anasarca, dizziness, palpitations Resp: +  shortness of breath with exertion or at rest.              No- productive cough,  + non-productive cough,  No- coughing up of blood.              No- change in color of mucus.  No- wheezing.   Skin: No-   rash or lesions. GI:  No-   heartburn, indigestion, abdominal pain, nausea, vomiting,  GU:  MS: . Limiting hip pain Neuro-     nothing unusual Psych:  No- change in mood or affect. No depression or anxiety.  No memory loss.  OBJ General- Alert, Oriented, Affect-appropriate, Distress- none acute, overweight, O2 2 L Skin- rash-none, lesions- none, excoriation- none Lymphadenopathy- none Head- atraumatic            Eyes- Gross vision intact, PERRLA, conjunctivae clear secretions            Ears- Hearing aids, [facility]  Nose- Clear, no-Septal dev, mucus, polyps, erosion, perforation             Throat- Mallampati II , mucosa clear , drainage- none, tonsils- atrophic Neck- flexible , trachea midline, no stridor , thyroid nl, carotid no bruit Chest - symmetrical excursion , unlabored           Heart/CV- RRR +bigeminal pulse , no murmur , no gallop  , no rub, nl s1 s2                           - JVD- none , edema- none, stasis changes- none, varices- none           Lung- +faint crackles  persist, unlabored, wheeze- none, + light cough,   dullness-none, rub- none. Raspy laugh.           Chest wall- pacemaker left chest Abd- Br/ Gen/ Rectal- Not done, not indicated Extrem- cyanosis- none, clubbing, none, atrophy- none, strength- deconditioned, cane Neuro- grossly intact to observation

## 2013-03-16 NOTE — Assessment & Plan Note (Signed)
Exertional hypoxemia reflects COPD, obesity with hypoventilation, and cardiomyopathy He desaturates enough with ambulation to qualify for continued oxygen with exertion. Plan-check overnight oximetry on room air with CPAP

## 2013-03-16 NOTE — Patient Instructions (Addendum)
Exercise walk for room air oximetry   Dx COPD      ONOX on CPAP room air     DX OSA, COPD  Your complaint of dizziness on standing may be from low blood pressure. You noted that it made no difference whether you were wearing your oxygen.  Please discuss this with Dr Felicity Coyer, who may want to try a change in BP medicine.

## 2013-03-17 ENCOUNTER — Ambulatory Visit (INDEPENDENT_AMBULATORY_CARE_PROVIDER_SITE_OTHER): Payer: Medicare Other | Admitting: Internal Medicine

## 2013-03-17 ENCOUNTER — Encounter: Payer: Self-pay | Admitting: Internal Medicine

## 2013-03-17 VITALS — BP 112/44 | HR 84 | Ht 65.5 in | Wt 252.1 lb

## 2013-03-17 VITALS — BP 132/80 | HR 71 | Temp 97.6°F | Wt 252.8 lb

## 2013-03-17 DIAGNOSIS — R627 Adult failure to thrive: Secondary | ICD-10-CM

## 2013-03-17 DIAGNOSIS — E119 Type 2 diabetes mellitus without complications: Secondary | ICD-10-CM | POA: Diagnosis not present

## 2013-03-17 DIAGNOSIS — I10 Essential (primary) hypertension: Secondary | ICD-10-CM

## 2013-03-17 DIAGNOSIS — K219 Gastro-esophageal reflux disease without esophagitis: Secondary | ICD-10-CM | POA: Diagnosis not present

## 2013-03-17 MED ORDER — VALSARTAN 160 MG PO TABS: 160.0000 mg | ORAL_TABLET | Freq: Every day | ORAL | Status: DC

## 2013-03-17 NOTE — Patient Instructions (Signed)
It was good to see you today. Medications reviewed and updated Call if systolic pressure of less than 110 or greater than 150 Followup in 3 months for diabetes and blood pressure check, call sooner if problems

## 2013-03-17 NOTE — Assessment & Plan Note (Signed)
On januvia no longer follows with endo (prev kerr and kumar) actos caused edema/fluid retention Intolerant of metformin due to nausea and "hospitalization"  Lab Results  Component Value Date   HGBA1C 6.9* 09/30/2012

## 2013-03-17 NOTE — Progress Notes (Signed)
Subjective:    Patient ID: Eric [name], male    DOB: 06-18-1926, 79 y.o.   MRN: [id number]  HPI Here for follow up - weakness, generalized associated with several falls - home and outside home in past 33mo - Recent evaluation by endocrinology for mild hypercalcemia -not felt to because of symptoms ?orthostatic from overtx HTN?  Also reviewed chronic medical issues and interval events:   Past Medical History  Diagnosis Date  . LACTOSE INTOLERANCE   . OBESITY   . CARDIOMYOPATHY, ISCHEMIC   . AORTIC SCLEROSIS   . SICK SINUS/ TACHY-BRADY SYNDROME 09/2007    s/p PPM st judes  . PERIPHERAL VASCULAR DISEASE   . CAROTID BRUIT, RIGHT 02/27/2008  . IBS (irritable bowel syndrome)   . ALLERGIC RHINITIS   . ANEMIA-NOS   . OA (osteoarthritis)   . COPD   . GERD   . HYPERLIPIDEMIA   . HIATAL HERNIA   . Diverticulosis   . Prostate cancer     seed implants 2004  . DIABETES MELLITUS-TYPE II     diet controlled  . CORONARY ARTERY DISEASE     CABG 1995, PTCA/DES 2008, 2009 and 08/2010  . HYPERTENSION   . Asthma   . Sleep apnea   . Partial small bowel obstruction   . Primary hyperparathyroidism     Lab Results Component Value Date  PTH 150.7* 02/13/2013  CALCIUM 11.0* 02/13/2013  CAION 1.21 03/15/2008      Review of Systems  Constitutional: Positive for fatigue. Negative for fever and unexpected weight change.  Respiratory: Positive for cough. Negative for shortness of breath and wheezing.   Cardiovascular: Negative for chest pain, palpitations and leg swelling.  Musculoskeletal: Negative for back pain, gait problem and myalgias.  Neurological: Positive for dizziness (occ, ?orthostatic) and weakness (generalized). Negative for tremors, seizures, facial asymmetry, speech difficulty, light-headedness, numbness and headaches.  Psychiatric/Behavioral: Positive for dysphoric mood and decreased concentration. Negative for confusion and self-injury. The patient is not nervous/anxious.        Objective:   Physical Exam BP 132/80  Pulse 71  Temp(Src) 97.6 F (36.4 C) (Oral)  Wt 252 lb 12.8 oz (114.669 kg)  BMI 41.41 kg/m2  SpO2 94% Wt Readings from Last 3 Encounters:  03/17/13 252 lb 12.8 oz (114.669 kg)  03/17/13 252 lb 2 oz (114.363 kg)  03/16/13 252 lb 6.4 oz (114.488 kg)   Constitutional:  Overweight, but appears well-developed and well-nourished. No distress at rest- ambulates with cane asst.  Neck: thick and short, Normal range of motion. Neck supple. No JVD present. No thyromegaly present.  Cardiovascular: Normal rate, regular rhythm and normal heart sounds.  No murmur heard. trace BLE edema Pulmonary/Chest: Effort normal and breath sounds diminished at bases. No respiratory distress. no wheezes.  Neurological: he is alert and oriented to person, place, and time. No cranial nerve deficit. Coordination and gait with cane assistance, slow and cautious but appropriate.  Psychiatric: he has a moderately depressed/anxious mood and affect. behavior is normal. Judgment and insight mildly impaired.      Lab Results  Component Value Date   WBC 10.5 02/02/2013   HGB 12.3* 02/02/2013   HCT 36.4* 02/02/2013   PLT 184.0 02/02/2013   CHOL 99 10/18/2012   TRIG 67 10/18/2012   HDL 44 10/18/2012   ALT 21 9/52/8413   AST 22 02/02/2013   NA 135 02/27/2013   K 4.4 02/27/2013   CL 102 02/27/2013   CREATININE 1.3 02/27/2013   BUN  21 02/27/2013   CO2 27 02/27/2013   TSH 2.40 02/02/2013   PSA 0.02* 09/10/2006   INR 1.00 08/13/2011   HGBA1C 6.9* 09/30/2012   MICROALBUR 0.8 09/30/2012    Assessment & Plan:   FTT/weakness generalized -progressive over past months Refuses ALF, not candidate for HH as mobile out of the home I strongly recommended pt consider move to ALF for supervision and asst, but he adamantly refuses to leave his apt where he lives alone  Dizziness - Possible orthostatic symptoms related to overtreated blood pressure. We'll discontinue Imdur, amlodipine and reduced dose of  valsartan at this time. Patient to monitor blood pressure and symptoms of dizziness at home and notify us if continued problems  See problem list. Medications and labs reviewed today.

## 2013-03-17 NOTE — Progress Notes (Signed)
  Subjective:    Patient ID: Eric [name], male    DOB: 1926/09/03, 79 y.o.   MRN: [id number]  HPI The patient is here and tells me he is ok on 40 mg Zegerid. However the [place] sent him 20 mg Zegerid and if he takes that he will get too much bicarbonate. He would like me to help him get 40 mg Zegerid. Medications, allergies, past medical history, past surgical history, family history and social history are reviewed and updated in the EMR.   Review of Systems Some light-headedness, uses a cane    Objective:   Physical Exam Obese, elderly, NAD     Assessment & Plan:

## 2013-03-17 NOTE — Assessment & Plan Note (Signed)
BP Readings from Last 3 Encounters:  03/17/13 132/80  03/17/13 112/44  03/16/13 108/60   ?orthostatic symptoms if overtreated -  DC Imdur, amlodipine and reduce ARB to qd Monitor and adjust/resume as needed based on BP and symptoms  Pt understands and agrees

## 2013-03-17 NOTE — Assessment & Plan Note (Signed)
Will see if we can help him get 40 mg Zegerid from [place] - not sure it is possible Some samples given today

## 2013-03-17 NOTE — Patient Instructions (Signed)
We have given you samples of Zegerid 40 mg/1100mg  today take 1 capsule daily.  [facility].   We appreciate the opportunity to see you today.

## 2013-03-21 ENCOUNTER — Telehealth: Payer: Self-pay

## 2013-03-21 NOTE — Telephone Encounter (Signed)
Amber from the [place] in [place] called from Dr. Clemens Catholic Heyat's office and I explained that he needs the Zegerid 40mg  not the 20mg  per Dr. Leone Payor.  She will get this approved thru him and send to the patient.  Informed patient to let me know if it doesn't come.

## 2013-03-23 ENCOUNTER — Encounter: Payer: Self-pay | Admitting: Internal Medicine

## 2013-03-24 ENCOUNTER — Ambulatory Visit (INDEPENDENT_AMBULATORY_CARE_PROVIDER_SITE_OTHER): Payer: Medicare Other | Admitting: Internal Medicine

## 2013-03-24 ENCOUNTER — Encounter: Payer: Self-pay | Admitting: Internal Medicine

## 2013-03-24 VITALS — BP 122/70 | HR 80 | Temp 97.9°F | Ht 66.0 in | Wt 255.0 lb

## 2013-03-24 DIAGNOSIS — E119 Type 2 diabetes mellitus without complications: Secondary | ICD-10-CM | POA: Diagnosis not present

## 2013-03-24 DIAGNOSIS — E210 Primary hyperparathyroidism: Secondary | ICD-10-CM

## 2013-03-24 DIAGNOSIS — R42 Dizziness and giddiness: Secondary | ICD-10-CM | POA: Diagnosis not present

## 2013-03-24 MED ORDER — ISOSORBIDE MONONITRATE ER 60 MG PO TB24: 60.0000 mg | ORAL_TABLET | Freq: Every evening | ORAL | Status: DC

## 2013-03-24 NOTE — Patient Instructions (Signed)
It was good to see you today.  We have reviewed your prior records including labs and tests today  Medications reviewed and updated Continued also iron 160 mg in the morning and take isosorbide 60 mg in the evening No other changes recommended  We'll make referral to Dr. Sharl Ma to review your calcium, hyperparathyroidism and diabetes -my office will call regarding This appointment  Followup in 3 months for diabetes and blood pressure check, call sooner if problems

## 2013-03-24 NOTE — Assessment & Plan Note (Signed)
On januvia no longer follows with endo (prev [facility] k[facility]) - willrefer back to [facility] at pt request now actos caused edema/fluid retention Intolerant of metformin due to nausea and "hospitalization"  Lab Results  Component Value Date   HGBA1C 6.9* 09/30/2012

## 2013-03-24 NOTE — Progress Notes (Signed)
Subjective:    Patient ID: Eric [name], male    DOB: 09-Sep-1926, 79 y.o.   MRN: [id number]  HPI Here for follow up - weakness, generalized and dizziness  Also reviewed chronic medical issues and interval events:   Past Medical History  Diagnosis Date  . LACTOSE INTOLERANCE   . OBESITY   . CARDIOMYOPATHY, ISCHEMIC   . AORTIC SCLEROSIS   . SICK SINUS/ TACHY-BRADY SYNDROME 09/2007    s/p PPM st judes  . PERIPHERAL VASCULAR DISEASE   . CAROTID BRUIT, RIGHT 02/27/2008  . IBS (irritable bowel syndrome)   . ALLERGIC RHINITIS   . ANEMIA-NOS   . OA (osteoarthritis)   . COPD   . GERD   . HYPERLIPIDEMIA   . HIATAL HERNIA   . Diverticulosis   . Prostate cancer     seed implants 2004  . DIABETES MELLITUS-TYPE II     diet controlled  . CORONARY ARTERY DISEASE     CABG 1995, PTCA/DES 2008, 2009 and 08/2010  . HYPERTENSION   . Asthma   . Sleep apnea   . Partial small bowel obstruction   . Primary hyperparathyroidism     Lab Results Component Value Date  PTH 150.7* 02/13/2013  CALCIUM 11.0* 02/13/2013  CAION 1.21 03/15/2008      Review of Systems  Constitutional: Positive for fatigue. Negative for fever and unexpected weight change.  Respiratory: Positive for cough. Negative for shortness of breath and wheezing.   Cardiovascular: Negative for chest pain, palpitations and leg swelling.  Musculoskeletal: Negative for back pain, gait problem and myalgias.  Neurological: Positive for dizziness (occ, ?orthostatic) and weakness (generalized). Negative for tremors, seizures, facial asymmetry, speech difficulty, light-headedness, numbness and headaches.  Psychiatric/Behavioral: Positive for dysphoric mood and decreased concentration. Negative for confusion and self-injury. The patient is not nervous/anxious.       Objective:   Physical Exam BP 122/70  Pulse 80  Temp(Src) 97.9 F (36.6 C) (Oral)  Ht 5\' 6"  (1.676 m)  Wt 255 lb (115.667 kg)  BMI 41.18 kg/m2  SpO2 95% Wt Readings from  Last 3 Encounters:  03/24/13 255 lb (115.667 kg)  03/17/13 252 lb 12.8 oz (114.669 kg)  03/17/13 252 lb 2 oz (114.363 kg)   Constitutional:  Overweight, but appears well-developed and well-nourished. No distress at rest- ambulates with cane asst.  Neck: thick and short, Normal range of motion. Neck supple. No JVD present. No thyromegaly present.  Cardiovascular: Normal rate, regular rhythm and normal heart sounds.  No murmur heard. trace BLE edema Pulmonary/Chest: Effort normal and breath sounds diminished at bases. No respiratory distress. no wheezes.  Neurological: he is alert and oriented to person, place, and time. No cranial nerve deficit. Coordination and gait with cane assistance, slow and cautious but appropriate.  Psychiatric: he has a moderately depressed/anxious mood and affect. behavior is normal. Judgment and insight mildly impaired.      Lab Results  Component Value Date   WBC 10.5 02/02/2013   HGB 12.3* 02/02/2013   HCT 36.4* 02/02/2013   PLT 184.0 02/02/2013   CHOL 99 10/18/2012   TRIG 67 10/18/2012   HDL 44 10/18/2012   ALT 21 09/14/8117   AST 22 02/02/2013   NA 135 02/27/2013   K 4.4 02/27/2013   CL 102 02/27/2013   CREATININE 1.3 02/27/2013   BUN 21 02/27/2013   CO2 27 02/27/2013   TSH 2.40 02/02/2013   PSA 0.02* 09/10/2006   INR 1.00 08/13/2011   HGBA1C 6.9*  09/30/2012   MICROALBUR 0.8 09/30/2012    Assessment & Plan:    Dizziness - ?possible orthostatic symptoms related to overtreated blood pressure. We stopped amlodipine and reduced dose of valsartan last week - ?improved. Pt chose not to stop Imdur but agrees to reduce dose to qPM now. Patient to monitor blood pressure and symptoms of dizziness at home and notify us if continued problems  See problem list. Medications and labs reviewed today.

## 2013-03-24 NOTE — Assessment & Plan Note (Signed)
s/p evaluation by Dr Lucianne Muss 02/2013 for hypercalcemia - felt to be asymptomatic Pt requests 2nd opinion from Dr Sharl Ma (previosuly had seen Sharl Ma for DM) - will arrange Lab Results  Component Value Date   PTH 150.7* 02/13/2013   CALCIUM 10.5 02/27/2013   CAION 1.21 03/15/2008

## 2013-03-27 ENCOUNTER — Telehealth: Payer: Self-pay | Admitting: Internal Medicine

## 2013-03-27 NOTE — Telephone Encounter (Signed)
Pt return call back LMOM on yesterday his BP was 150/62. Been running 142/63, 144/64, 147/62, 139/68, 134/65, 131/60. He states not all that bad but seem like it increasing. Pls advise...Raechel Chute

## 2013-03-27 NOTE — Telephone Encounter (Signed)
The patient called the triage line hoping to get a change on his blood pressure medication.  He stated his blood pressure was high, and he believes he needs an increased dose.   Callback - [phone]   Thanks!

## 2013-03-27 NOTE — Telephone Encounter (Signed)
Called pt back to get BP readings no answer LMOM RTC...Raechel Chute

## 2013-03-27 NOTE — Telephone Encounter (Signed)
Readings reviewed. I am okay with these readings. No additional medication changes recommended at this time. Thanks

## 2013-03-27 NOTE — Telephone Encounter (Signed)
Notified pt with md response.../lmb 

## 2013-03-30 ENCOUNTER — Ambulatory Visit: Payer: Medicare Other | Admitting: Internal Medicine

## 2013-03-31 ENCOUNTER — Ambulatory Visit: Payer: Medicare Other | Admitting: Internal Medicine

## 2013-04-03 DIAGNOSIS — E119 Type 2 diabetes mellitus without complications: Secondary | ICD-10-CM | POA: Diagnosis not present

## 2013-04-03 DIAGNOSIS — Z961 Presence of intraocular lens: Secondary | ICD-10-CM | POA: Diagnosis not present

## 2013-04-03 DIAGNOSIS — H16109 Unspecified superficial keratitis, unspecified eye: Secondary | ICD-10-CM | POA: Diagnosis not present

## 2013-04-03 DIAGNOSIS — H02059 Trichiasis without entropian unspecified eye, unspecified eyelid: Secondary | ICD-10-CM | POA: Diagnosis not present

## 2013-04-03 LAB — HM DIABETES EYE EXAM

## 2013-04-04 ENCOUNTER — Encounter: Payer: Self-pay | Admitting: Internal Medicine

## 2013-04-06 ENCOUNTER — Encounter: Payer: Self-pay | Admitting: Interventional Cardiology

## 2013-04-06 ENCOUNTER — Telehealth: Payer: Self-pay | Admitting: Internal Medicine

## 2013-04-06 ENCOUNTER — Telehealth: Payer: Self-pay | Admitting: Interventional Cardiology

## 2013-04-06 MED ORDER — ALBUTEROL SULFATE (2.5 MG/3ML) 0.083% IN NEBU: 2.5000 mg | INHALATION_SOLUTION | RESPIRATORY_TRACT | Status: DC | PRN

## 2013-04-06 NOTE — Telephone Encounter (Signed)
Pt. Called because he said for the 1/2 part of the summer his PCP has been treating him for dizziness and weakness, and is not getting better. His BP has been 110 to 112/40. For the last 1 1/2 weeks pt. Has been having pain on both of his shoulders, neck and to elbow it hurts when he is up and about, and  it goes away when he is lying down. Pt states has not experience this symptoms before even when he was having a heart attach. Pt wanted to make sure is his muscles. Pt is assured that it is probable muscle pain. Pt has an appointment with Dr. Katrinka Blazing on 04/10/13 at 9:45 PM. Pt will keep appointment.

## 2013-04-06 NOTE — Telephone Encounter (Signed)
New Problem:  Pt states all summer long he has been weak. Pt states his shoulders ache. Pt also states he has bee feeling dizzy recently. Pt states he would like to be advised.

## 2013-04-06 NOTE — Telephone Encounter (Signed)
Rx has been sent in. Pt is aware. 

## 2013-04-07 ENCOUNTER — Telehealth: Payer: Self-pay | Admitting: Internal Medicine

## 2013-04-07 ENCOUNTER — Other Ambulatory Visit: Payer: Self-pay | Admitting: Internal Medicine

## 2013-04-07 MED ORDER — ESOMEPRAZOLE MAGNESIUM 40 MG PO CPDR: 40.0000 mg | DELAYED_RELEASE_CAPSULE | Freq: Every day | ORAL | Status: DC

## 2013-04-07 MED ORDER — ALBUTEROL SULFATE (2.5 MG/3ML) 0.083% IN NEBU: 2.5000 mg | INHALATION_SOLUTION | RESPIRATORY_TRACT | Status: DC | PRN

## 2013-04-07 NOTE — Telephone Encounter (Signed)
Patient is requesting to go back on Nexium 40 mg daily.  He says the zegerid is not helping , still "burping and burning".  He says if you approve this I need to fax an rx to his [facility] In [place], (fax# [phone]) for a years worth if possible of refills.  He has an appointment with Dr. Darrick Huntsman on 04/11/13. Please advise Sir, thank you.

## 2013-04-07 NOTE — Telephone Encounter (Signed)
Pt called in regards to Albuterol neb meds that were supposed to be called in 04/06/13. Looks as though it may have been printed and faxed. Verbal order for Albuterol 0.083% 75mL x 5 rf placed with pharmacist. Dx: COPD  Pt aware via detailed msg.

## 2013-04-07 NOTE — Telephone Encounter (Signed)
Ok to do what he requests

## 2013-04-07 NOTE — Telephone Encounter (Signed)
Informed patient that his Nexium rx has been faxed to [facility] outpatient clinic at [phone], to Amber's attention (contact person) there.  Patient to d/c Zegerid.  He said thank you for helping him.

## 2013-04-07 NOTE — Telephone Encounter (Signed)
Left message on his voice mail for him to call me back.

## 2013-04-10 ENCOUNTER — Telehealth: Payer: Self-pay

## 2013-04-10 ENCOUNTER — Ambulatory Visit (INDEPENDENT_AMBULATORY_CARE_PROVIDER_SITE_OTHER): Payer: Medicare Other | Admitting: Interventional Cardiology

## 2013-04-10 ENCOUNTER — Encounter: Payer: Self-pay | Admitting: Interventional Cardiology

## 2013-04-10 VITALS — BP 158/70 | HR 60 | Ht 66.0 in | Wt 252.0 lb

## 2013-04-10 DIAGNOSIS — E785 Hyperlipidemia, unspecified: Secondary | ICD-10-CM

## 2013-04-10 DIAGNOSIS — I495 Sick sinus syndrome: Secondary | ICD-10-CM | POA: Diagnosis not present

## 2013-04-10 DIAGNOSIS — Z950 Presence of cardiac pacemaker: Secondary | ICD-10-CM

## 2013-04-10 DIAGNOSIS — I25810 Atherosclerosis of coronary artery bypass graft(s) without angina pectoris: Secondary | ICD-10-CM | POA: Diagnosis not present

## 2013-04-10 DIAGNOSIS — R0989 Other specified symptoms and signs involving the circulatory and respiratory systems: Secondary | ICD-10-CM

## 2013-04-10 NOTE — Progress Notes (Signed)
Patient ID: Eric [name], male   DOB: 1927-05-18, 79 y.o.   MRN: [id number]    [address], [address] [place], [place]  [address] Phone: [phone] Fax:  [phone]  Date:  04/10/2013   ID:  PHARES ZACCONE, DOB [birth year], MRN [id number]  PCP:  Rene Paci, MD   ASSESSMENT:  1. Coronary atherosclerosis, stable without significant angina 2. Hyperlipidemia, on therapy and with reasonable control 3. Bilateral shoulder discomfort, considered to be musculoskeletal/mechanical and nonischemic 4. Tachybradycardia syndrome, stable with medications and pacemaker therapy  PLAN:  1. no change in the current medical regimen 2. Diet and physical activity to facilitate weight loss. 3. Followup with primary care physician condition concerning shoulder discomfort   SUBJECTIVE: Eric [name] is a 79 y.o. male who is having no specific cardiac complaints. He seems confused and nervous about her change of location. He denies chest discomfort but his been having continuous bilateral shoulder discomfort. The discomfort is aggravated by using his arms to do repetitive motion, lifting, and pushing. He denies dyspnea. No significant chest discomfort and nitroglycerin use. No peripheral edema. He walks with a cane and continues to gain weight.   Wt Readings from Last 3 Encounters:  04/10/13 252 lb (114.306 kg)  03/24/13 255 lb (115.667 kg)  03/17/13 252 lb 12.8 oz (114.669 kg)     Past Medical History  Diagnosis Date  . LACTOSE INTOLERANCE   . OBESITY   . CARDIOMYOPATHY, ISCHEMIC   . AORTIC SCLEROSIS   . SICK SINUS/ TACHY-BRADY SYNDROME 09/2007    s/p PPM st judes  . PERIPHERAL VASCULAR DISEASE   . CAROTID BRUIT, RIGHT 02/27/2008  . IBS (irritable bowel syndrome)   . ALLERGIC RHINITIS   . ANEMIA-NOS   . OA (osteoarthritis)   . COPD   . GERD   . HYPERLIPIDEMIA   . HIATAL HERNIA   . Diverticulosis   . Prostate cancer     seed implants 2004  . DIABETES MELLITUS-TYPE II       diet controlled  . CORONARY ARTERY DISEASE     CABG 1995, PTCA/DES 2008, 2009 and 08/2010  . HYPERTENSION   . Asthma   . Sleep apnea   . Partial small bowel obstruction   . Primary hyperparathyroidism     Lab Results Component Value Date  PTH 150.7* 02/13/2013  CALCIUM 11.0* 02/13/2013  CAION 1.21 03/15/2008      Current Outpatient Prescriptions  Medication Sig Dispense Refill  . albuterol (PROVENTIL HFA) 108 (90 BASE) MCG/ACT inhaler Inhale 2 puffs into the lungs every 4 (four) hours as needed. Wheezing       . albuterol (PROVENTIL) (2.5 MG/3ML) 0.083% nebulizer solution Take 3 mLs (2.5 mg total) by nebulization every 4 (four) hours as needed (Dx: COPD 496). Wheezing  75 mL  5  . albuterol-ipratropium (COMBIVENT) 18-103 MCG/ACT inhaler Inhale 2 puffs into the lungs every 6 (six) hours as needed. Wheezing       . aluminum & magnesium hydroxide (MAALOX) 225-200 MG/5ML suspension Take 15 mLs by mouth every 6 (six) hours as needed. Heart burn       . bumetanide (BUMEX) 1 MG tablet Take 1 mg by mouth daily after breakfast.       . clopidogrel (PLAVIX) 75 MG tablet Take 75 mg by mouth daily after breakfast.       . esomeprazole (NEXIUM) 40 MG capsule Take 1 capsule (40 mg total) by mouth daily before breakfast.  90 capsule  3  . fluticasone (FLONASE) 50 MCG/ACT nasal spray Place 2 sprays into the nose daily. Allergies  16 g  1  . glucose blood test strip 1 each by Other route 2 (two) times daily.  100 each  3  . HYDROcodone-acetaminophen (NORCO) 7.5-325 MG per tablet Take 1-2 tablets by mouth every 4 (four) hours as needed.      . isosorbide mononitrate (IMDUR) 60 MG 24 hr tablet Take 1 tablet (60 mg total) by mouth every evening.      . Niacin CR 1000 MG TBCR Take 1,000 mg by mouth at bedtime.       . nitroGLYCERIN (NITROSTAT) 0.4 MG SL tablet Place 0.4 mg under the tongue every 5 (five) minutes as needed. Chest pain       . Polyethyl Glycol-Propyl Glycol (SYSTANE) 0.4-0.3 % SOLN Apply 2  drops to eye at bedtime.      . polyethylene glycol (MIRALAX / GLYCOLAX) packet Take 17 g by mouth daily as needed. Constipation       . pravastatin (PRAVACHOL) 20 MG tablet Take 60 mg by mouth at bedtime.       . senna (SENOKOT) 8.6 MG tablet Alternates with one tablet one day then two tablets the next day      . valsartan (DIOVAN) 160 MG tablet Take 1 tablet (160 mg total) by mouth daily.       No current facility-administered medications for this visit.    Allergies:    Allergies  Allergen Reactions  . Actos [Pioglitazone Hydrochloride] Other (See Comments)    "felt funny, drowsy, and weak":  . Celebrex [Celecoxib] Other (See Comments)    "felt funny"  . Demerol Other (See Comments)    Increased BP  . Morphine And Related Nausea And Vomiting  . Ciprofloxacin     REACTION: Arthralgia  . Metformin Nausea And Vomiting  . Zocor [Simvastatin]     Makes pt very drowsy    Social History:  The patient  reports that he quit smoking about 17 years ago. He has never used smokeless tobacco. He reports that he does not drink alcohol or use illicit drugs.   ROS:  Please see the history of present illness.   Not sleeping well. Remains nervous and anxious.   All other systems reviewed and negative.   OBJECTIVE: VS:  BP 158/70  Pulse 60  Ht 5\' 6"  (1.676 m)  Wt 252 lb (114.306 kg)  BMI 40.69 kg/m2 Well nourished, well developed, in no acute distress, obese HEENT: normal Neck: JVD flat. Carotid bruit right carotid bruit  Cardiac:  normal S1, S2; RRR;  1/6 systolic  murmur Lungs:  clear to auscultation bilaterally, no wheezing, rhonchi or rales Abd: soft, nontender, no hepatomegaly Ext: Edema trace. Pulses 2+ Skin: warm and dry Neuro:  CNs 2-12 intact, no focal abnormalities noted  EKG:  NSR with IRBBB       Signed, Darci Needle III, MD 04/10/2013 10:38 AM

## 2013-04-10 NOTE — Patient Instructions (Signed)
Your physician recommends that you continue on your current medications as directed. Please refer to the Current Medication list given to you today.  Your physician has requested that you have a carotid duplex. This test is an ultrasound of the carotid arteries in your neck. It looks at blood flow through these arteries that supply the brain with blood. Allow one hour for this exam. There are no restrictions or special instructions.  Your physician wants you to follow-up in: 1 year. You will receive a reminder letter in the mail two months in advance. If you don't receive a letter, please call our office to schedule the follow-up appointment.  

## 2013-04-10 NOTE — Telephone Encounter (Signed)
Left detailed message for Eric [name] with Dr. Darrick Huntsman office at [facility].  You have to leave message with the central center at [phone], the lady I spoke with took my contact information should Eric [name] need to call me back.  I just wanted to confirm that she got the rx for Nexium that I faxed over 04/07/13 along with a note to d/c his Zegerid since it's not helping.  Change approved by Dr. Stan Head.

## 2013-04-11 ENCOUNTER — Telehealth: Payer: Self-pay | Admitting: *Deleted

## 2013-04-11 MED ORDER — VALSARTAN 160 MG PO TABS: 160.0000 mg | ORAL_TABLET | Freq: Two times a day (BID) | ORAL | Status: DC

## 2013-04-11 NOTE — Telephone Encounter (Signed)
Increase Diovan to 160 mg twice daily. No other changes recommended

## 2013-04-11 NOTE — Telephone Encounter (Signed)
Pt states his BP keep increasing running anywhere between 150-155/80 something. MD stop some of his BP meds due to BP running low. Pt is wanting to know does he need to start back on something, what md recommend him to do...lmb

## 2013-04-11 NOTE — Telephone Encounter (Signed)
Notified pt with md response.../lmb 

## 2013-04-12 LAB — CBC AND DIFFERENTIAL
HCT: 38 % — AB (ref 41–53)
WBC: 4 10^3/mL

## 2013-04-12 LAB — HEPATIC FUNCTION PANEL
AST: 22 U/L (ref 14–40)
Bilirubin, Direct: 0.1 mg/dL (ref 0.01–0.4)
Bilirubin, Total: 0.5 mg/dL

## 2013-04-12 LAB — BASIC METABOLIC PANEL
BUN: 17 mg/dL (ref 4–21)
Creatinine: 0.9 mg/dL (ref 0.6–1.3)
Glucose: 116 mg/dL
Potassium: 4.3 mmol/L (ref 3.4–5.3)
Sodium: 138 mmol/L (ref 137–147)

## 2013-04-12 LAB — LIPID PANEL
LDL Cholesterol: 53 mg/dL
Triglycerides: 120 mg/dL (ref 40–160)

## 2013-04-12 LAB — TSH: TSH: 1.33 u[IU]/mL (ref 0.41–5.90)

## 2013-04-13 ENCOUNTER — Ambulatory Visit (INDEPENDENT_AMBULATORY_CARE_PROVIDER_SITE_OTHER): Payer: Medicare Other | Admitting: *Deleted

## 2013-04-13 ENCOUNTER — Telehealth: Payer: Self-pay | Admitting: Internal Medicine

## 2013-04-13 DIAGNOSIS — I495 Sick sinus syndrome: Secondary | ICD-10-CM | POA: Diagnosis not present

## 2013-04-13 DIAGNOSIS — Z950 Presence of cardiac pacemaker: Secondary | ICD-10-CM

## 2013-04-13 DIAGNOSIS — J449 Chronic obstructive pulmonary disease, unspecified: Secondary | ICD-10-CM

## 2013-04-13 NOTE — Telephone Encounter (Signed)
I spoke with pt. He is requesting to get a lighter POC to have for when he goes out. He currently has portable O2 but it is too heavy to carry on his shoulder. Please advise if okay to send order to [facility] Dr. Maple Hudson thanks

## 2013-04-13 NOTE — Telephone Encounter (Signed)
Order placed and pt aware. Staff message sent to [facility]

## 2013-04-13 NOTE — Telephone Encounter (Signed)
Ok order [facility]   Evaluate for light portable O2 system 2L  For sleep and portable, dx COPD

## 2013-04-13 NOTE — Progress Notes (Signed)
Device check in clinic, all functions normal, no changes made, full details in [facility].  ROV w/ Dr. Graciela Husbands 07/17/13.

## 2013-04-17 ENCOUNTER — Ambulatory Visit (HOSPITAL_COMMUNITY): Payer: Medicare Other | Attending: Interventional Cardiology

## 2013-04-17 DIAGNOSIS — R0989 Other specified symptoms and signs involving the circulatory and respiratory systems: Secondary | ICD-10-CM | POA: Diagnosis not present

## 2013-04-17 DIAGNOSIS — I658 Occlusion and stenosis of other precerebral arteries: Secondary | ICD-10-CM | POA: Diagnosis not present

## 2013-04-17 DIAGNOSIS — E785 Hyperlipidemia, unspecified: Secondary | ICD-10-CM | POA: Insufficient documentation

## 2013-04-17 DIAGNOSIS — I2510 Atherosclerotic heart disease of native coronary artery without angina pectoris: Secondary | ICD-10-CM | POA: Insufficient documentation

## 2013-04-17 DIAGNOSIS — I10 Essential (primary) hypertension: Secondary | ICD-10-CM | POA: Insufficient documentation

## 2013-04-17 DIAGNOSIS — I6529 Occlusion and stenosis of unspecified carotid artery: Secondary | ICD-10-CM | POA: Diagnosis not present

## 2013-04-17 DIAGNOSIS — R42 Dizziness and giddiness: Secondary | ICD-10-CM

## 2013-04-17 DIAGNOSIS — E119 Type 2 diabetes mellitus without complications: Secondary | ICD-10-CM | POA: Diagnosis not present

## 2013-04-17 DIAGNOSIS — Z87891 Personal history of nicotine dependence: Secondary | ICD-10-CM | POA: Diagnosis not present

## 2013-04-17 DIAGNOSIS — J4489 Other specified chronic obstructive pulmonary disease: Secondary | ICD-10-CM | POA: Insufficient documentation

## 2013-04-17 DIAGNOSIS — J449 Chronic obstructive pulmonary disease, unspecified: Secondary | ICD-10-CM | POA: Insufficient documentation

## 2013-04-18 ENCOUNTER — Telehealth: Payer: Self-pay

## 2013-04-18 DIAGNOSIS — I6529 Occlusion and stenosis of unspecified carotid artery: Secondary | ICD-10-CM

## 2013-04-18 LAB — MDC_IDC_ENUM_SESS_TYPE_INCLINIC
Battery Impedance: 1 Ohm
Battery Voltage: 2.76 V
Brady Statistic RA Percent Paced: 37 %
Brady Statistic RV Percent Paced: 1 %
Implantable Pulse Generator Model: 5826
Lead Channel Impedance Value: 379 Ohm
Lead Channel Impedance Value: 434 Ohm
Lead Channel Pacing Threshold Pulse Width: 0.4 ms
Lead Channel Sensing Intrinsic Amplitude: 3.3 mV
Lead Channel Sensing Intrinsic Amplitude: 8.7 mV
Lead Channel Setting Pacing Amplitude: 2 V
Lead Channel Setting Sensing Sensitivity: 2 mV

## 2013-04-18 NOTE — Telephone Encounter (Signed)
Follow Up ° ° ° ° ° ° ° °Pt returning Lisa's call. °

## 2013-04-18 NOTE — Telephone Encounter (Signed)
Message copied by Jarvis Newcomer on Tue Apr 18, 2013  4:44 PM ------      Message from: Verdis Prime      Created: Tue Apr 18, 2013  7:29 AM       Left < 60% stenosis and right < 50%.      Overall, findings are non threatening. Repeat 1 year. ------

## 2013-04-18 NOTE — Telephone Encounter (Signed)
Message copied by Jarvis Newcomer on Tue Apr 18, 2013  2:41 PM ------      Message from: Verdis Prime      Created: Tue Apr 18, 2013  7:29 AM       Left < 60% stenosis and right < 50%.      Overall, findings are non threatening. Repeat 1 year. ------

## 2013-04-21 ENCOUNTER — Encounter: Payer: Self-pay | Admitting: Internal Medicine

## 2013-04-21 ENCOUNTER — Ambulatory Visit (INDEPENDENT_AMBULATORY_CARE_PROVIDER_SITE_OTHER): Payer: Medicare Other | Admitting: Internal Medicine

## 2013-04-21 VITALS — BP 132/60 | HR 60 | Temp 97.4°F | Wt 250.0 lb

## 2013-04-21 DIAGNOSIS — M545 Low back pain: Secondary | ICD-10-CM | POA: Diagnosis not present

## 2013-04-21 DIAGNOSIS — F419 Anxiety disorder, unspecified: Secondary | ICD-10-CM

## 2013-04-21 DIAGNOSIS — F411 Generalized anxiety disorder: Secondary | ICD-10-CM

## 2013-04-21 DIAGNOSIS — I10 Essential (primary) hypertension: Secondary | ICD-10-CM | POA: Diagnosis not present

## 2013-04-21 NOTE — Patient Instructions (Signed)
It was good to see you today.  We have reviewed your prior records including labs and tests today  Medications reviewed and updated, No changes recommended  Followup in 4 months, call sooner if problems

## 2013-04-21 NOTE — Assessment & Plan Note (Signed)
BP Readings from Last 3 Encounters:  04/21/13 132/60  04/10/13 158/70  03/24/13 122/70   Hx orthostatic symptoms if overtreated -  Monitor and adjust/resume as needed based on BP and symptoms  The current medical regimen is effective;  continue present plan and medications.

## 2013-04-21 NOTE — Assessment & Plan Note (Signed)
Hx same 01/2011 precipitated by move to [place] and senior appt complex- on prn xanax to help with panic attacks and started low dose sertraline 12/2011 Initially improved - but increasing anger spells and irritability fall 2013 Titrated up 04/2012, but no significant change Also uses prn alprazolam infrequently Verified no SI/HI Support offered

## 2013-04-21 NOTE — Progress Notes (Signed)
  Subjective:    Patient ID: Eric [name], male    DOB: 23-Nov-1926, 79 y.o.   MRN: [id number]  HPI Here for follow up - generalized weakness, hypertension   reviewed chronic medical issues and interval events:   Past Medical History  Diagnosis Date  . LACTOSE INTOLERANCE   . OBESITY   . CARDIOMYOPATHY, ISCHEMIC   . AORTIC SCLEROSIS   . SICK SINUS/ TACHY-BRADY SYNDROME 09/2007    s/p PPM st judes  . PERIPHERAL VASCULAR DISEASE   . CAROTID BRUIT, RIGHT 02/27/2008  . IBS (irritable bowel syndrome)   . ALLERGIC RHINITIS   . ANEMIA-NOS   . OA (osteoarthritis)   . COPD   . GERD   . HYPERLIPIDEMIA   . HIATAL HERNIA   . Diverticulosis   . Prostate cancer     seed implants 2004  . DIABETES MELLITUS-TYPE II     diet controlled  . CORONARY ARTERY DISEASE     CABG 1995, PTCA/DES 2008, 2009 and 08/2010  . HYPERTENSION   . Asthma   . Sleep apnea   . Partial small bowel obstruction   . Primary hyperparathyroidism     Lab Results Component Value Date  PTH 150.7* 02/13/2013  CALCIUM 11.0* 02/13/2013  CAION 1.21 03/15/2008      Review of Systems  Constitutional: Positive for fatigue. Negative for fever and unexpected weight change.  Respiratory: Negative for cough, shortness of breath and wheezing.   Cardiovascular: Negative for chest pain, palpitations and leg swelling.  Musculoskeletal: Negative for back pain, gait problem and myalgias.  Neurological: Positive for weakness (generalized). Negative for tremors, seizures, facial asymmetry, speech difficulty, light-headedness, numbness and headaches.  Psychiatric/Behavioral: Positive for dysphoric mood. Negative for confusion and self-injury. The patient is nervous/anxious.       Objective:   Physical Exam BP 132/60  Pulse 60  Temp(Src) 97.4 F (36.3 C) (Oral)  Wt 250 lb (113.399 kg)  SpO2 95% Wt Readings from Last 3 Encounters:  04/21/13 250 lb (113.399 kg)  04/10/13 252 lb (114.306 kg)  03/24/13 255 lb (115.667 kg)    Constitutional:  Overweight, but appears well-developed and well-nourished. No distress at rest- ambulates without cane today Neck: thick and short, Normal range of motion. Neck supple. No JVD present. No thyromegaly present.  Cardiovascular: Normal rate, regular rhythm and normal heart sounds.  No murmur heard. trace BLE edema Pulmonary/Chest: Effort normal and breath sounds diminished at bases. No respiratory distress. no wheezes. Psychiatric: he has a minimally depressed/anxious mood and affect. behavior is normal. Judgment and insight normal.      Lab Results  Component Value Date   WBC 10.5 02/02/2013   HGB 12.3* 02/02/2013   HCT 36.4* 02/02/2013   PLT 184.0 02/02/2013   CHOL 99 10/18/2012   TRIG 67 10/18/2012   HDL 44 10/18/2012   ALT 21 09/08/4740   AST 22 02/02/2013   NA 135 02/27/2013   K 4.4 02/27/2013   CL 102 02/27/2013   CREATININE 1.3 02/27/2013   BUN 21 02/27/2013   CO2 27 02/27/2013   TSH 2.40 02/02/2013   PSA 0.02* 09/10/2006   INR 1.00 08/13/2011   HGBA1C 6.9* 09/30/2012   MICROALBUR 0.8 09/30/2012    Assessment & Plan:   See problem list. Medications and labs reviewed today.

## 2013-04-21 NOTE — Progress Notes (Signed)
Pre-visit discussion using our clinic review tool. No additional management support is needed unless otherwise documented below in the visit note.  

## 2013-04-21 NOTE — Assessment & Plan Note (Signed)
History of surgical intervention July 2010 for same -  following with [facility] intemittently for same - ongoing care and ESI by Dr. Glenford Bayley associated with chronic right leg pain, unclear by history if this is hip or back etiology

## 2013-04-24 ENCOUNTER — Encounter: Payer: Self-pay | Admitting: Internal Medicine

## 2013-04-25 ENCOUNTER — Encounter: Payer: Self-pay | Admitting: Internal Medicine

## 2013-05-15 DIAGNOSIS — H16109 Unspecified superficial keratitis, unspecified eye: Secondary | ICD-10-CM | POA: Diagnosis not present

## 2013-05-15 DIAGNOSIS — E1149 Type 2 diabetes mellitus with other diabetic neurological complication: Secondary | ICD-10-CM | POA: Diagnosis not present

## 2013-05-15 DIAGNOSIS — H02059 Trichiasis without entropian unspecified eye, unspecified eyelid: Secondary | ICD-10-CM | POA: Diagnosis not present

## 2013-05-15 DIAGNOSIS — H04129 Dry eye syndrome of unspecified lacrimal gland: Secondary | ICD-10-CM | POA: Diagnosis not present

## 2013-05-15 DIAGNOSIS — E1142 Type 2 diabetes mellitus with diabetic polyneuropathy: Secondary | ICD-10-CM | POA: Diagnosis not present

## 2013-05-15 DIAGNOSIS — H01009 Unspecified blepharitis unspecified eye, unspecified eyelid: Secondary | ICD-10-CM | POA: Diagnosis not present

## 2013-05-15 DIAGNOSIS — E210 Primary hyperparathyroidism: Secondary | ICD-10-CM | POA: Diagnosis not present

## 2013-05-15 DIAGNOSIS — E669 Obesity, unspecified: Secondary | ICD-10-CM | POA: Diagnosis not present

## 2013-05-30 DIAGNOSIS — M5137 Other intervertebral disc degeneration, lumbosacral region: Secondary | ICD-10-CM | POA: Diagnosis not present

## 2013-06-06 DIAGNOSIS — E210 Primary hyperparathyroidism: Secondary | ICD-10-CM | POA: Diagnosis not present

## 2013-06-06 DIAGNOSIS — M899 Disorder of bone, unspecified: Secondary | ICD-10-CM | POA: Diagnosis not present

## 2013-06-22 ENCOUNTER — Encounter: Payer: Self-pay | Admitting: Internal Medicine

## 2013-06-22 DIAGNOSIS — Z950 Presence of cardiac pacemaker: Secondary | ICD-10-CM | POA: Diagnosis not present

## 2013-06-22 DIAGNOSIS — I495 Sick sinus syndrome: Secondary | ICD-10-CM | POA: Diagnosis not present

## 2013-07-03 ENCOUNTER — Ambulatory Visit: Payer: Medicare Other | Admitting: Cardiology

## 2013-07-11 DIAGNOSIS — D235 Other benign neoplasm of skin of trunk: Secondary | ICD-10-CM | POA: Diagnosis not present

## 2013-07-11 DIAGNOSIS — L219 Seborrheic dermatitis, unspecified: Secondary | ICD-10-CM | POA: Diagnosis not present

## 2013-07-17 ENCOUNTER — Encounter: Payer: Self-pay | Admitting: Internal Medicine

## 2013-07-17 ENCOUNTER — Telehealth: Payer: Self-pay | Admitting: Internal Medicine

## 2013-07-17 ENCOUNTER — Inpatient Hospital Stay (HOSPITAL_COMMUNITY)
Admission: EM | Admit: 2013-07-17 | Discharge: 2013-07-18 | DRG: 313 | Disposition: A | Discharge status: Home / Self Care | Payer: Medicare Other | Attending: Cardiology | Admitting: Cardiology

## 2013-07-17 ENCOUNTER — Ambulatory Visit (INDEPENDENT_AMBULATORY_CARE_PROVIDER_SITE_OTHER): Payer: Medicare Other | Admitting: Internal Medicine

## 2013-07-17 ENCOUNTER — Encounter (HOSPITAL_COMMUNITY): Payer: Self-pay | Admitting: Emergency Medicine

## 2013-07-17 ENCOUNTER — Emergency Department (HOSPITAL_COMMUNITY): Payer: Medicare Other

## 2013-07-17 VITALS — BP 120/60 | HR 74 | Ht 66.0 in

## 2013-07-17 DIAGNOSIS — M1990 Unspecified osteoarthritis, unspecified site: Secondary | ICD-10-CM | POA: Diagnosis present

## 2013-07-17 DIAGNOSIS — Z96649 Presence of unspecified artificial hip joint: Secondary | ICD-10-CM

## 2013-07-17 DIAGNOSIS — G4730 Sleep apnea, unspecified: Secondary | ICD-10-CM | POA: Diagnosis present

## 2013-07-17 DIAGNOSIS — Z951 Presence of aortocoronary bypass graft: Secondary | ICD-10-CM | POA: Diagnosis not present

## 2013-07-17 DIAGNOSIS — E669 Obesity, unspecified: Secondary | ICD-10-CM | POA: Diagnosis present

## 2013-07-17 DIAGNOSIS — G4733 Obstructive sleep apnea (adult) (pediatric): Secondary | ICD-10-CM

## 2013-07-17 DIAGNOSIS — K449 Diaphragmatic hernia without obstruction or gangrene: Secondary | ICD-10-CM | POA: Diagnosis not present

## 2013-07-17 DIAGNOSIS — Z950 Presence of cardiac pacemaker: Secondary | ICD-10-CM

## 2013-07-17 DIAGNOSIS — J449 Chronic obstructive pulmonary disease, unspecified: Secondary | ICD-10-CM | POA: Diagnosis present

## 2013-07-17 DIAGNOSIS — I700 Atherosclerosis of aorta: Secondary | ICD-10-CM | POA: Diagnosis present

## 2013-07-17 DIAGNOSIS — K5730 Diverticulosis of large intestine without perforation or abscess without bleeding: Secondary | ICD-10-CM | POA: Diagnosis present

## 2013-07-17 DIAGNOSIS — K219 Gastro-esophageal reflux disease without esophagitis: Secondary | ICD-10-CM | POA: Diagnosis not present

## 2013-07-17 DIAGNOSIS — Z7902 Long term (current) use of antithrombotics/antiplatelets: Secondary | ICD-10-CM | POA: Diagnosis not present

## 2013-07-17 DIAGNOSIS — I10 Essential (primary) hypertension: Secondary | ICD-10-CM | POA: Diagnosis not present

## 2013-07-17 DIAGNOSIS — Z79899 Other long term (current) drug therapy: Secondary | ICD-10-CM

## 2013-07-17 DIAGNOSIS — Z87891 Personal history of nicotine dependence: Secondary | ICD-10-CM

## 2013-07-17 DIAGNOSIS — Z6839 Body mass index (BMI) 39.0-39.9, adult: Secondary | ICD-10-CM | POA: Diagnosis not present

## 2013-07-17 DIAGNOSIS — I739 Peripheral vascular disease, unspecified: Secondary | ICD-10-CM | POA: Diagnosis present

## 2013-07-17 DIAGNOSIS — J4489 Other specified chronic obstructive pulmonary disease: Secondary | ICD-10-CM | POA: Diagnosis present

## 2013-07-17 DIAGNOSIS — Z9861 Coronary angioplasty status: Secondary | ICD-10-CM

## 2013-07-17 DIAGNOSIS — R079 Chest pain, unspecified: Secondary | ICD-10-CM

## 2013-07-17 DIAGNOSIS — I2589 Other forms of chronic ischemic heart disease: Secondary | ICD-10-CM | POA: Diagnosis present

## 2013-07-17 DIAGNOSIS — I2510 Atherosclerotic heart disease of native coronary artery without angina pectoris: Secondary | ICD-10-CM | POA: Diagnosis present

## 2013-07-17 DIAGNOSIS — I495 Sick sinus syndrome: Secondary | ICD-10-CM

## 2013-07-17 DIAGNOSIS — Z8546 Personal history of malignant neoplasm of prostate: Secondary | ICD-10-CM

## 2013-07-17 DIAGNOSIS — R0789 Other chest pain: Principal | ICD-10-CM | POA: Diagnosis present

## 2013-07-17 DIAGNOSIS — K589 Irritable bowel syndrome without diarrhea: Secondary | ICD-10-CM | POA: Diagnosis present

## 2013-07-17 DIAGNOSIS — E119 Type 2 diabetes mellitus without complications: Secondary | ICD-10-CM | POA: Diagnosis present

## 2013-07-17 DIAGNOSIS — R0989 Other specified symptoms and signs involving the circulatory and respiratory systems: Secondary | ICD-10-CM | POA: Diagnosis not present

## 2013-07-17 DIAGNOSIS — E785 Hyperlipidemia, unspecified: Secondary | ICD-10-CM | POA: Diagnosis present

## 2013-07-17 DIAGNOSIS — K7689 Other specified diseases of liver: Secondary | ICD-10-CM | POA: Diagnosis not present

## 2013-07-17 LAB — CBC WITH DIFFERENTIAL/PLATELET
BASOS ABS: 0 10*3/uL (ref 0.0–0.1)
Basophils Relative: 0 % (ref 0–1)
Eosinophils Absolute: 0.3 10*3/uL (ref 0.0–0.7)
Eosinophils Relative: 4 % (ref 0–5)
HEMATOCRIT: 37.6 % — AB (ref 39.0–52.0)
Hemoglobin: 12.6 g/dL — ABNORMAL LOW (ref 13.0–17.0)
LYMPHS PCT: 7 % — AB (ref 12–46)
Lymphs Abs: 0.5 10*3/uL — ABNORMAL LOW (ref 0.7–4.0)
MCH: 33.3 pg (ref 26.0–34.0)
MCHC: 33.5 g/dL (ref 30.0–36.0)
MCV: 99.5 fL (ref 78.0–100.0)
MONO ABS: 0.7 10*3/uL (ref 0.1–1.0)
Monocytes Relative: 10 % (ref 3–12)
NEUTROS ABS: 5.3 10*3/uL (ref 1.7–7.7)
NEUTROS PCT: 79 % — AB (ref 43–77)
Platelets: 156 10*3/uL (ref 150–400)
RBC: 3.78 MIL/uL — ABNORMAL LOW (ref 4.22–5.81)
RDW: 12.5 % (ref 11.5–15.5)
WBC: 6.7 10*3/uL (ref 4.0–10.5)

## 2013-07-17 LAB — LIPASE, BLOOD: Lipase: 12 U/L (ref 11–59)

## 2013-07-17 LAB — MDC_IDC_ENUM_SESS_TYPE_INCLINIC
Battery Impedance: 1000 Ohm — CL
Battery Voltage: 2.78 V
Implantable Pulse Generator Serial Number: 2094876
Lead Channel Impedance Value: 379 Ohm
Lead Channel Pacing Threshold Amplitude: 0.75 V
Lead Channel Pacing Threshold Pulse Width: 0.4 ms
Lead Channel Sensing Intrinsic Amplitude: 5 mV
Lead Channel Setting Pacing Amplitude: 2 V
Lead Channel Setting Pacing Pulse Width: 0.4 ms
Lead Channel Setting Sensing Sensitivity: 2 mV
MDC IDC MSMT LEADCHNL RA PACING THRESHOLD AMPLITUDE: 0.75 V
MDC IDC MSMT LEADCHNL RA PACING THRESHOLD PULSEWIDTH: 0.4 ms
MDC IDC MSMT LEADCHNL RV IMPEDANCE VALUE: 474 Ohm
MDC IDC MSMT LEADCHNL RV SENSING INTR AMPL: 11 mV
MDC IDC SESS DTM: 20150209144421

## 2013-07-17 LAB — URINALYSIS, ROUTINE W REFLEX MICROSCOPIC
Bilirubin Urine: NEGATIVE
GLUCOSE, UA: NEGATIVE mg/dL
Hgb urine dipstick: NEGATIVE
Ketones, ur: NEGATIVE mg/dL
LEUKOCYTES UA: NEGATIVE
Nitrite: NEGATIVE
PROTEIN: NEGATIVE mg/dL
Specific Gravity, Urine: 1.025 (ref 1.005–1.030)
Urobilinogen, UA: 0.2 mg/dL (ref 0.0–1.0)
pH: 6.5 (ref 5.0–8.0)

## 2013-07-17 LAB — COMPREHENSIVE METABOLIC PANEL
ALK PHOS: 69 U/L (ref 39–117)
ALT: 21 U/L (ref 0–53)
AST: 28 U/L (ref 0–37)
Albumin: 4 g/dL (ref 3.5–5.2)
BILIRUBIN TOTAL: 0.3 mg/dL (ref 0.3–1.2)
BUN: 17 mg/dL (ref 6–23)
CHLORIDE: 99 meq/L (ref 96–112)
CO2: 22 meq/L (ref 19–32)
CREATININE: 0.91 mg/dL (ref 0.50–1.35)
Calcium: 10.5 mg/dL (ref 8.4–10.5)
GFR calc Af Amer: 88 mL/min — ABNORMAL LOW (ref >90)
GFR, EST NON AFRICAN AMERICAN: 76 mL/min — AB (ref >90)
Glucose, Bld: 125 mg/dL — ABNORMAL HIGH (ref 70–99)
POTASSIUM: 4.2 meq/L (ref 3.7–5.3)
Sodium: 137 mEq/L (ref 137–147)
Total Protein: 7.3 g/dL (ref 6.0–8.3)

## 2013-07-17 LAB — POCT I-STAT TROPONIN I: Troponin i, poc: 0.01 ng/mL (ref 0.00–0.08)

## 2013-07-17 MED ORDER — GI COCKTAIL ~~LOC~~
30.0000 mL | Freq: Once | ORAL | Status: AC
  Administered 2013-07-17: 30 mL via ORAL
  Filled 2013-07-17: qty 30

## 2013-07-17 MED ORDER — ALUM & MAG HYDROXIDE-SIMETH 200-200-20 MG/5ML PO SUSP: 15.0000 mL | Freq: Four times a day (QID) | ORAL | Status: DC | PRN

## 2013-07-17 MED ORDER — IRBESARTAN 150 MG PO TABS
150.0000 mg | ORAL_TABLET | Freq: Every day | ORAL | Status: DC
  Administered 2013-07-18: 150 mg via ORAL
  Filled 2013-07-17: qty 1

## 2013-07-17 MED ORDER — ISOSORBIDE MONONITRATE ER 60 MG PO TB24
60.0000 mg | ORAL_TABLET | Freq: Every evening | ORAL | Status: DC
  Filled 2013-07-17: qty 1

## 2013-07-17 MED ORDER — SENNA 8.6 MG PO TABS
1.0000 | ORAL_TABLET | Freq: Every evening | ORAL | Status: DC | PRN
  Filled 2013-07-17: qty 1

## 2013-07-17 MED ORDER — REGADENOSON 0.4 MG/5ML IV SOLN
0.4000 mg | Freq: Once | INTRAVENOUS | Status: AC
  Administered 2013-07-18: 0.4 mg via INTRAVENOUS
  Filled 2013-07-17: qty 5

## 2013-07-17 MED ORDER — ASPIRIN 81 MG PO CHEW
324.0000 mg | CHEWABLE_TABLET | Freq: Once | ORAL | Status: AC
  Administered 2013-07-17: 324 mg via ORAL
  Filled 2013-07-17: qty 4

## 2013-07-17 MED ORDER — NIACIN ER 500 MG PO TBCR
1000.0000 mg | EXTENDED_RELEASE_TABLET | Freq: Every day | ORAL | Status: DC
  Filled 2013-07-17: qty 2

## 2013-07-17 MED ORDER — FLUTICASONE PROPIONATE 50 MCG/ACT NA SUSP
2.0000 | Freq: Every day | NASAL | Status: DC
  Administered 2013-07-18: 2 via NASAL
  Filled 2013-07-17: qty 16

## 2013-07-17 MED ORDER — POLYVINYL ALCOHOL 1.4 % OP SOLN
2.0000 [drp] | Freq: Every day | OPHTHALMIC | Status: DC
  Administered 2013-07-17: 2 [drp] via OPHTHALMIC
  Filled 2013-07-17: qty 15

## 2013-07-17 MED ORDER — NIACIN ER 500 MG PO CPCR
1000.0000 mg | ORAL_CAPSULE | Freq: Every day | ORAL | Status: DC
  Filled 2013-07-17 (×2): qty 2

## 2013-07-17 MED ORDER — NITROGLYCERIN 0.4 MG SL SUBL: 0.4000 mg | SUBLINGUAL_TABLET | SUBLINGUAL | Status: DC | PRN

## 2013-07-17 MED ORDER — ALUMINUM & MAGNESIUM HYDROXIDE 225-200 MG/5ML PO SUSP
15.0000 mL | Freq: Four times a day (QID) | ORAL | Status: DC | PRN
  Filled 2013-07-17: qty 355

## 2013-07-17 MED ORDER — CLOPIDOGREL BISULFATE 75 MG PO TABS
75.0000 mg | ORAL_TABLET | Freq: Every day | ORAL | Status: DC
  Administered 2013-07-18: 75 mg via ORAL
  Filled 2013-07-17: qty 1

## 2013-07-17 MED ORDER — SIMVASTATIN 10 MG PO TABS
10.0000 mg | ORAL_TABLET | Freq: Every day | ORAL | Status: DC
  Filled 2013-07-17: qty 1

## 2013-07-17 MED ORDER — BUMETANIDE 1 MG PO TABS
1.0000 mg | ORAL_TABLET | Freq: Every day | ORAL | Status: DC
  Administered 2013-07-18: 1 mg via ORAL
  Filled 2013-07-17 (×2): qty 1

## 2013-07-17 MED ORDER — ALBUTEROL SULFATE (2.5 MG/3ML) 0.083% IN NEBU: 2.5000 mg | INHALATION_SOLUTION | RESPIRATORY_TRACT | Status: DC | PRN

## 2013-07-17 MED ORDER — ESOMEPRAZOLE MAGNESIUM 40 MG PO CPDR: 40.0000 mg | DELAYED_RELEASE_CAPSULE | Freq: Every day | ORAL | Status: DC

## 2013-07-17 MED ORDER — IPRATROPIUM-ALBUTEROL 18-103 MCG/ACT IN AERO: 2.0000 | INHALATION_SPRAY | Freq: Four times a day (QID) | RESPIRATORY_TRACT | Status: DC | PRN

## 2013-07-17 MED ORDER — ALBUTEROL SULFATE HFA 108 (90 BASE) MCG/ACT IN AERS: 2.0000 | INHALATION_SPRAY | RESPIRATORY_TRACT | Status: DC | PRN

## 2013-07-17 MED ORDER — IPRATROPIUM-ALBUTEROL 0.5-2.5 (3) MG/3ML IN SOLN: 3.0000 mL | Freq: Four times a day (QID) | RESPIRATORY_TRACT | Status: DC | PRN

## 2013-07-17 MED ORDER — HYDROCODONE-ACETAMINOPHEN 7.5-325 MG PO TABS: 1.0000 | ORAL_TABLET | ORAL | Status: DC | PRN

## 2013-07-17 MED ORDER — POLYETHYLENE GLYCOL 3350 17 G PO PACK
17.0000 g | PACK | Freq: Every day | ORAL | Status: DC | PRN
  Filled 2013-07-17: qty 1

## 2013-07-17 MED ORDER — PANTOPRAZOLE SODIUM 40 MG PO TBEC
80.0000 mg | DELAYED_RELEASE_TABLET | Freq: Every day | ORAL | Status: DC
  Administered 2013-07-18: 80 mg via ORAL
  Filled 2013-07-17: qty 2

## 2013-07-17 NOTE — Assessment & Plan Note (Signed)
The patient's device was interrogated.  The information was reviewed. No changes were made in the programming.    

## 2013-07-17 NOTE — ED Notes (Signed)
Patient returned from x-ray, cardiac monitor reapplied

## 2013-07-17 NOTE — H&P (Signed)
Admit date: 07/17/2013 Primary Physician  Rene Paci, MD Primary Cardiologist  Dr. Lynnell Jude, MD as PCP - General  Lesleigh Noe, MD as Consulting Physician (Cardiology)  Shayne Alken, MD as Consulting Physician (Physical Medicine and Rehabilitation)  Duke Salvia, MD as Consulting Physician (Cardiology)  Iva Boop, MD as Consulting Physician (Gastroenterology)  Bjorn Pippin, MD as Consulting Physician (Urology)  Christia Reading, MD as Consulting Physician (Otolaryngology)  Javier Docker, MD (Orthopedic Surgery)  Waymon Budge, MD (Pulmonary Disease)   CC: Chest pain  HPI: 80 year old male with history of bypass surgery, coronary artery disease which in 2012 underwent cardiac catheterization with stenting of his vein graft to obtuse marginal, normal LV function at that time here after being seen earlier today by Dr. Berton Mount in the cardiology office for pacemaker evaluation. He was sent to the emergency room because of chest pain that he has been experiencing over the past 2 weeks he states. He states that his chest pain is worse when laying down, better when sitting up when he first described it to me. No description of fevers, cough, chills, syncope, orthopnea, bleeding, melena. Then he began to describe that his chest pain became more apparent one hour after being awake when he was up and about. No significant exertional change. Intermittent. Epigastric with radiation around his left flank. He is not quite sure if it changes with food. He does describe a burning. He received a GI cocktail here in the emergency department and this did not seem to help his discomfort that much. He did state that he was changed to generic Prilosec and perhaps this may be difference but he is not sure.  Here he underwent ultrasound of his gallbladder as well as other lab work including lipase, liver functions all of which were unremarkable. Troponin at first check was  normal.  He has an extensive GI history.  Currently he is sitting on the side of the bed eating a peanut butter cracker. Appears quite comfortable. He was concerned about having to pay for the hospital stay.    PMH:   Past Medical History  Diagnosis Date  . LACTOSE INTOLERANCE   . OBESITY   . CARDIOMYOPATHY, ISCHEMIC   . AORTIC SCLEROSIS   . SICK SINUS/ TACHY-BRADY SYNDROME 09/2007    s/p PPM st judes  . PERIPHERAL VASCULAR DISEASE   . CAROTID BRUIT, RIGHT 02/27/2008  . IBS (irritable bowel syndrome)   . ALLERGIC RHINITIS   . ANEMIA-NOS   . OA (osteoarthritis)   . COPD   . GERD   . HYPERLIPIDEMIA   . HIATAL HERNIA   . Diverticulosis   . Prostate cancer     seed implants 2004  . DIABETES MELLITUS-TYPE II     diet controlled  . CORONARY ARTERY DISEASE     CABG 1995, PTCA/DES 2008, 2009 and 08/2010  . HYPERTENSION   . Asthma   . Sleep apnea   . Partial small bowel obstruction   . Primary hyperparathyroidism     Lab Results Component Value Date  PTH 150.7* 02/13/2013  CALCIUM 11.0* 02/13/2013  CAION 1.21 03/15/2008      PSH:   Past Surgical History  Procedure Laterality Date  . Ptca  2008, 2009, 2012    with DES  . Partial small bowel obstruction  2009  . Bilateral cataracts    . Pacemaker insertion      DDD/[employer]  Last interrogation 2/13  on chart     Pacemaker guideline order Dr Tamala Julian on chart  . Inguinal hernia repair Bilateral   . Lumbar disc surgery  12/2008  . Coronary artery bypass graft    . Total hip arthroplasty  08/21/2011    Procedure: TOTAL HIP ARTHROPLASTY;  Surgeon: Johnn Hai, MD;  Location: [facility];  Service: Orthopedics;  Laterality: Right;  . Colonoscopy    . Esophagogastroduodenoscopy    . Penile prosthesis placement     Allergies:  Actos; Celebrex; Demerol; Morphine and related; Ciprofloxacin; Metformin; and Zocor Prior to Admit Meds:   Prior to Admission medications   Medication Sig Start Date End Date Taking? Authorizing  Provider  albuterol (PROVENTIL HFA) 108 (90 BASE) MCG/ACT inhaler Inhale 2 puffs into the lungs every 4 (four) hours as needed. Wheezing    Yes Historical Provider, MD  albuterol (PROVENTIL) (2.5 MG/3ML) 0.083% nebulizer solution Take 3 mLs (2.5 mg total) by nebulization every 4 (four) hours as needed (Dx: COPD 496). Wheezing 04/07/13  Yes Deneise Lever, MD  albuterol-ipratropium (COMBIVENT) 18-103 MCG/ACT inhaler Inhale 2 puffs into the lungs every 6 (six) hours as needed. Wheezing    Yes Historical Provider, MD  aluminum & magnesium hydroxide (MAALOX) 225-200 MG/5ML suspension Take 15 mLs by mouth every 6 (six) hours as needed. Heart burn    Yes Historical Provider, MD  bumetanide (BUMEX) 1 MG tablet Take 1 mg by mouth daily after breakfast.    Yes Historical Provider, MD  clopidogrel (PLAVIX) 75 MG tablet Take 75 mg by mouth daily after breakfast.    Yes Historical Provider, MD  esomeprazole (NEXIUM) 40 MG capsule Take 1 capsule (40 mg total) by mouth daily at 12 noon. 07/17/13  Yes Gatha Mayer, MD  fluticasone (FLONASE) 50 MCG/ACT nasal spray Place 2 sprays into the nose daily. Allergies 11/09/12  Yes Rowe Clack, MD  HYDROcodone-acetaminophen (NORCO) 7.5-325 MG per tablet Take 1-2 tablets by mouth every 4 (four) hours as needed. 08/24/11  Yes Rometta Emery, PA-C  isosorbide mononitrate (IMDUR) 60 MG 24 hr tablet Take 1 tablet (60 mg total) by mouth every evening. 03/24/13  Yes Rowe Clack, MD  Niacin CR 1000 MG TBCR Take 1,000 mg by mouth at bedtime.    Yes Historical Provider, MD  nitroGLYCERIN (NITROSTAT) 0.4 MG SL tablet Place 0.4 mg under the tongue every 5 (five) minutes as needed. Chest pain    Yes Historical Provider, MD  Polyethyl Glycol-Propyl Glycol (SYSTANE) 0.4-0.3 % SOLN Apply 2 drops to eye at bedtime.   Yes Historical Provider, MD  polyethylene glycol (MIRALAX / GLYCOLAX) packet Take 17 g by mouth daily as needed. Constipation    Yes Historical Provider, MD    pravastatin (PRAVACHOL) 20 MG tablet Take 60 mg by mouth at bedtime.    Yes Historical Provider, MD  senna (SENOKOT) 8.6 MG tablet Alternates with one tablet one day then two tablets the next day   Yes Historical Provider, MD  valsartan (DIOVAN) 160 MG tablet Take 1 tablet (160 mg total) by mouth 2 (two) times daily. 04/11/13  Yes Rowe Clack, MD   Fam HX:    Family History  Problem Relation Age of Onset  . Colon cancer Neg Hx    Social HX:    History   Social History  . Marital Status: Widowed    Spouse Name: N/A    Number of Children: N/A  . Years of Education: N/A  Occupational History  . retired    Social History Main Topics  . Smoking status: Former Smoker    Quit date: 06/09/1995  . Smokeless tobacco: Never Used     Comment: Retired, lives alone, but has a lady friend  . Alcohol Use: No  . Drug Use: No  . Sexual Activity: Not on file   Other Topics Concern  . Not on file   Social History Narrative  . No narrative on file     ROS:  All 11 ROS were addressed and are negative except what is stated in the HPI   Physical Exam: Blood pressure 151/52, pulse 59, temperature 98.5 F (36.9 C), temperature source Oral, resp. rate 20, SpO2 97.00%.   General: Well developed, well nourished, in no acute distress Head: Eyes PERRLA, No xanthomas.   Normal cephalic and atramatic  Lungs:  Clear bilaterally to auscultation and percussion. Normal respiratory effort. No wheezes, no rales. Heart:  HRRR S1 S2 Pulses are 2+ & equal. No murmurs, rubs or gallops.             No carotid bruit. No JVD.  No abdominal bruits. Abdomen: Bowel sounds are positive, abdomen soft and non-tender without masses or                 Hernia's noted. No hepatosplenomegaly. Obese. No tenderness currently but he did state that he has felt abdominal tenderness off and on. Msk:  Back normal, normal gait. Normal strength and tone for age. Extremities:  No clubbing, cyanosis or edema.  DP +1 Neuro:  Alert and oriented X 3, non-focal, MAE x 4 GU: Deferred Rectal: Deferred Psych:  Good affect, responds appropriately         Labs:   Lab Results  Component Value Date   WBC 6.7 07/17/2013   HGB 12.6* 07/17/2013   HCT 37.6* 07/17/2013   MCV 99.5 07/17/2013   PLT 156 07/17/2013    Recent Labs Lab 07/17/13 1535  NA 137  K 4.2  CL 99  CO2 22  BUN 17  CREATININE 0.91  CALCIUM 10.5  PROT 7.3  BILITOT 0.3  ALKPHOS 69  ALT 21  AST 28  GLUCOSE 125*   No results found for this basename: CKTOTAL, CKMB, TROPONINI,  in the last 72 hours Lab Results  Component Value Date   CHOL 110 04/12/2013   HDL 33* 04/12/2013   LDLCALC 53 04/12/2013   TRIG 120 04/12/2013   No results found for this basename: DDIMER     Radiology:  Dg Chest 2 View  07/17/2013   CLINICAL DATA:  Abdominal pain.  Heartburn.  EXAM: CHEST  2 VIEW  COMPARISON:  01/25/2013.  FINDINGS: Sequential pacemaker enters from the left with leads unchanged in position at the level of the right atrium and right ventricle. Post CABG. Cardiomegaly.  Calcified tortuous aorta.  Mild central pulmonary vascular prominence stable.  Basilar scarring/subsegmental atelectasis without segmental consolidation.  No pneumothorax.  IMPRESSION: Sequential pacemaker in place.  Post CABG. Cardiomegaly.  Calcified tortuous aorta.  Mild central pulmonary vascular prominence stable.  Basilar scarring/subsegmental atelectasis without segmental consolidation.   Electronically Signed   By: Chauncey Cruel M.D.   On: 07/17/2013 16:32   US Abdomen Complete  07/17/2013   CLINICAL DATA:  Right upper quadrant and epigastric pain.  EXAM: ULTRASOUND ABDOMEN COMPLETE  COMPARISON:  CT ABD/PELVIS W CM dated 06/25/2012; US ABDOMEN COMPLETE dated 09/18/2006  FINDINGS: Gallbladder:  No gallstones or wall thickening visualized.  No sonographic Murphy sign noted.  Common bile duct:  Diameter: 4 mm, within normal limits.  Liver:  Anechoic lesions with increased through transmission in  the liver measure up to 3.9 x 3.2 x 4.0 cm. Largest lesions contains internal septations and may be minimally larger than on 09/18/2006. Question mild increase in hepatic parenchymal echogenicity.  IVC:  No abnormality visualized.  Pancreas:  Obscured by bowel gas.  Spleen:  8.3 cm, negative.  Right Kidney:  Length: 11.2 cm. Parenchymal echogenicity is within normal limits. No hydronephrosis.  Left Kidney:  Length: 11.5 cm. Parenchymal echogenicity is within normal limits. No hydronephrosis.  Abdominal aorta:  Portions are obscured by bowel gas.  No definite aneurysm.  Other findings:  None.  IMPRESSION: 1. No acute findings to explain the patient's given symptoms. 2. Liver appears fatty with scattered cysts, the largest of which appears mildly complex.   Electronically Signed   By: Lorin Picket M.D.   On: 07/17/2013 18:35   Personally viewed.   EKG:  Normal sinus rhythm heart rate 76, poor R wave progression, low-voltage, left axis deviation. There is no significant change from prior although left axis deviation was not present. This may be lead positioning. No significant ST segment changes. Personally viewed.  ASSESSMENT/PLAN:   80 year old male with coronary artery disease status post bypass with stents to SVG to diagonal as well as SVG to obtuse marginal last placed in 2012 here with atypical chest discomfort/burning possible gastric origin.  1. Atypical chest pain-we will continue with troponin cycling. N.p.o. past midnight. I will set him up for a nuclear stress test. Possible 2 day study. Because of this, he will likely be here past two midnights. We will go ahead and admit him. He may also require further GI evaluation. He may require consultation by Dr. Carlean Purl or associates. Of course, if cardiac etiology is confidently excluded, GI evaluation may be performed as outpatient. We will continue with anti-anginal switch include isosorbide.  2. Coronary artery disease-extensive coronary artery  disease history as reviewed above. Continue Plavix and other secondary prevention. He does not wish to take a statin medication because this causes him peripheral neuropathy.  3. Obesity-strongly encourage weight loss.  4. Diabetes-monitor closely, insulin sliding scale if needed.  5. Pacemaker-functioning well. Candee Furbish, MD  07/17/2013  8:09 PM

## 2013-07-17 NOTE — ED Notes (Signed)
Patient transported to X-ray 

## 2013-07-17 NOTE — ED Notes (Signed)
Patient transported to Ultrasound 

## 2013-07-17 NOTE — Telephone Encounter (Signed)
Patient was started on alendronate sodium adn has had heartburn since.  He stopped all PPIs back in November.  He says the [place] will not give him nexium.  He has some at home and I will provide him some samples.  He has asked that I send another prescription to the [place] and I will also do that.  I will fax it to [phone].  He will call periodically for nexium samples,.

## 2013-07-17 NOTE — Assessment & Plan Note (Signed)
Pt with complex symptoms   suggestive of abdominal discomfort given the percussion tenderness but also his mind reminiscent of his angina. I spoke with the ER physicians. We'll send to the emergency room and asked them to help with GI evaluation; it is unrevealing we'll need to be admitted for cardiac evaluation

## 2013-07-17 NOTE — Patient Instructions (Addendum)
Your physician recommends that you continue on your current medications as directed. Please refer to the Current Medication list given to you today.  Dr. Caryl Comes is recommending you go to ED because of abdominal chest pain .  Your physician wants you to follow-up in: 6 months with device clinic.  You will receive a reminder letter in the mail two months in advance. If you don't receive a letter, please call our office to schedule the follow-up appointment.  Your physician wants you to follow-up in: 1 year with Dr. Caryl Comes.  You will receive a reminder letter in the mail two months in advance. If you don't receive a letter, please call our office to schedule the follow-up appointment.

## 2013-07-17 NOTE — Assessment & Plan Note (Signed)
Brady pacing

## 2013-07-17 NOTE — Progress Notes (Signed)
f      Patient Care Team: Rowe Clack, MD as PCP - [place] III, MD as Consulting Physician (Cardiology) Cindee Salt, MD as Consulting Physician (Physical Medicine and Rehabilitation) Deboraha Sprang, MD as Consulting Physician (Cardiology) Gatha Mayer, MD as Consulting Physician (Gastroenterology) Irine Seal, MD as Consulting Physician (Urology) Melida Quitter, MD as Consulting Physician (Otolaryngology) Johnn Hai, MD (Orthopedic Surgery) Deneise Lever, MD (Pulmonary Disease) Beryle Beams, MD (Gastroenterology)   HPI  Eric [name] is a 80 y.o. male  Seen to establish pacemaker followup for device that I implanted 2009 for sinus node I have not seen him since.  He comes in today complaining of pain in his abd and chest which was into his back. It is not present upon awakening but then becomes more prominent over the next couple of hours. It is not aggravated by exertion or relieved by rest. There is some discomfort into the back. It is his impression that is related to the change in his Nexium 4 months ago. I  Over the last 20 or 30 minutes he now wondering whether this visit reminiscent of prior angina.  Hx of CAD with CABG Cath 2009 at which time vein graft to diagonal was stented. In 2012 he underwent stenting of his vein graft to the OM. LV function was normal on Sundays undertaken for chest pain.  Has not had peripheral edema.   Past Medical History  Diagnosis Date  . LACTOSE INTOLERANCE   . OBESITY   . CARDIOMYOPATHY, ISCHEMIC   . AORTIC SCLEROSIS   . SICK SINUS/ TACHY-BRADY SYNDROME 09/2007    s/p PPM st judes  . PERIPHERAL VASCULAR DISEASE   . CAROTID BRUIT, RIGHT 02/27/2008  . IBS (irritable bowel syndrome)   . ALLERGIC RHINITIS   . ANEMIA-NOS   . OA (osteoarthritis)   . COPD   . GERD   . HYPERLIPIDEMIA   . HIATAL HERNIA   . Diverticulosis   . Prostate cancer     seed implants 2004  . DIABETES MELLITUS-TYPE II    diet controlled  . CORONARY ARTERY DISEASE     CABG 1995, PTCA/DES 2008, 2009 and 08/2010  . HYPERTENSION   . Asthma   . Sleep apnea   . Partial small bowel obstruction   . Primary hyperparathyroidism     Lab Results Component Value Date  PTH 150.7* 02/13/2013  CALCIUM 11.0* 02/13/2013  CAION 1.21 03/15/2008      Past Surgical History  Procedure Laterality Date  . Ptca  2008, 2009, 2012    with DES  . Partial small bowel obstruction  2009  . Bilateral cataracts    . Pacemaker insertion      DDD/[employer]         Last interrogation 2/13  on chart     Pacemaker guideline order Dr Tamala Julian on chart  . Inguinal hernia repair Bilateral   . Lumbar disc surgery  12/2008  . Coronary artery bypass graft    . Total hip arthroplasty  08/21/2011    Procedure: TOTAL HIP ARTHROPLASTY;  Surgeon: Johnn Hai, MD;  Location: [facility];  Service: Orthopedics;  Laterality: Right;  . Colonoscopy    . Esophagogastroduodenoscopy    . Penile prosthesis placement      Current Outpatient Prescriptions  Medication Sig Dispense Refill  . albuterol (PROVENTIL HFA) 108 (90 BASE) MCG/ACT inhaler Inhale 2 puffs into the lungs every 4 (four)  hours as needed. Wheezing       . albuterol (PROVENTIL) (2.5 MG/3ML) 0.083% nebulizer solution Take 3 mLs (2.5 mg total) by nebulization every 4 (four) hours as needed (Dx: COPD 496). Wheezing  75 mL  5  . albuterol-ipratropium (COMBIVENT) 18-103 MCG/ACT inhaler Inhale 2 puffs into the lungs every 6 (six) hours as needed. Wheezing       . aluminum & magnesium hydroxide (MAALOX) 225-200 MG/5ML suspension Take 15 mLs by mouth every 6 (six) hours as needed. Heart burn       . bumetanide (BUMEX) 1 MG tablet Take 1 mg by mouth daily after breakfast.       . clopidogrel (PLAVIX) 75 MG tablet Take 75 mg by mouth daily after breakfast.       . esomeprazole (NEXIUM) 40 MG capsule Take 1 capsule (40 mg total) by mouth daily before breakfast.  90 capsule  3  . esomeprazole (NEXIUM) 40  MG capsule Take 1 capsule (40 mg total) by mouth daily at 12 noon.  20 capsule  0  . fluticasone (FLONASE) 50 MCG/ACT nasal spray Place 2 sprays into the nose daily. Allergies  16 g  1  . glucose blood test strip 1 each by Other route 2 (two) times daily.  100 each  3  . HYDROcodone-acetaminophen (NORCO) 7.5-325 MG per tablet Take 1-2 tablets by mouth every 4 (four) hours as needed.      . isosorbide mononitrate (IMDUR) 60 MG 24 hr tablet Take 1 tablet (60 mg total) by mouth every evening.      . Niacin CR 1000 MG TBCR Take 1,000 mg by mouth at bedtime.       . nitroGLYCERIN (NITROSTAT) 0.4 MG SL tablet Place 0.4 mg under the tongue every 5 (five) minutes as needed. Chest pain       . Polyethyl Glycol-Propyl Glycol (SYSTANE) 0.4-0.3 % SOLN Apply 2 drops to eye at bedtime.      . polyethylene glycol (MIRALAX / GLYCOLAX) packet Take 17 g by mouth daily as needed. Constipation       . pravastatin (PRAVACHOL) 20 MG tablet Take 60 mg by mouth at bedtime.       . senna (SENOKOT) 8.6 MG tablet Alternates with one tablet one day then two tablets the next day      . valsartan (DIOVAN) 160 MG tablet Take 1 tablet (160 mg total) by mouth 2 (two) times daily.       No current facility-administered medications for this visit.    Allergies  Allergen Reactions  . Actos [Pioglitazone Hydrochloride] Other (See Comments)    "felt funny, drowsy, and weak":  . Celebrex [Celecoxib] Other (See Comments)    "felt funny"  . Demerol Other (See Comments)    Increased BP  . Morphine And Related Nausea And Vomiting  . Ciprofloxacin     REACTION: Arthralgia  . Metformin Nausea And Vomiting  . Zocor [Simvastatin]     Makes pt very drowsy    Review of Systems negative except from HPI and PMH  Physical Exam BP 120/60  Pulse 74  Ht 5\' 6"  (1.676 m) Well developed and well nourished in no acute distress HENT normal E scleral and icterus clear Neck Supple JVP flat; carotids brisk and full Clear to  ausculation  Regular rate and rhythm, no murmurs gallops or rub Soft with active bowel sounds with percussion tenderness No clubbing cyanosis none Edema Alert and oriented, grossly normal motor and sensory function Skin  Warm and Dry  ECG  NSR  74  18/08/38  Assessment and  Plan

## 2013-07-17 NOTE — ED Notes (Signed)
Cardiologist MD at bedside

## 2013-07-17 NOTE — ED Provider Notes (Signed)
CSN: [id number]     Arrival date & time 07/17/13  1444 History   First MD Initiated Contact with Patient 07/17/13 1501     Chief Complaint  Patient presents with  . Abdominal Pain  . Heartburn     (Consider location/radiation/quality/duration/timing/severity/associated sxs/prior Treatment) HPI Comments: Pt seen at Dr. Sammuel Bailiff office earlier today due to epigastric pain that is persistent for the last 2 weeks intermittently.  He states feels similar to when he got his last stents placed 2 years ago but also thinks it could be indigestions.  Food does not really seem to exacerbate the pain but it shoots to bilateral upper quadrants of the abd and to the back.  Patient is a 80 y.o. male presenting with abdominal pain and heartburn. The history is provided by the patient.  Abdominal Pain Pain location:  Epigastric Pain quality: burning and shooting   Pain radiates to:  Back Pain severity:  Moderate Onset quality:  Gradual Duration:  2 weeks Timing:  Intermittent Progression:  Waxing and waning Chronicity:  New Context comment:  Better at night and seems to be worse during the day and with exertion Relieved by:  Lying down Worsened by:  Movement Associated symptoms: anorexia, diarrhea, nausea and shortness of breath   Associated symptoms: no chest pain, no constipation, no cough, no dysuria, no fever and no vomiting   Risk factors: being elderly   Risk factors: no NSAID use   Heartburn Associated symptoms include abdominal pain and shortness of breath. Pertinent negatives include no chest pain.    Past Medical History  Diagnosis Date  . LACTOSE INTOLERANCE   . OBESITY   . CARDIOMYOPATHY, ISCHEMIC   . AORTIC SCLEROSIS   . SICK SINUS/ TACHY-BRADY SYNDROME 09/2007    s/p PPM st judes  . PERIPHERAL VASCULAR DISEASE   . CAROTID BRUIT, RIGHT 02/27/2008  . IBS (irritable bowel syndrome)   . ALLERGIC RHINITIS   . ANEMIA-NOS   . OA (osteoarthritis)   . COPD   . GERD   .  HYPERLIPIDEMIA   . HIATAL HERNIA   . Diverticulosis   . Prostate cancer     seed implants 2004  . DIABETES MELLITUS-TYPE II     diet controlled  . CORONARY ARTERY DISEASE     CABG 1995, PTCA/DES 2008, 2009 and 08/2010  . HYPERTENSION   . Asthma   . Sleep apnea   . Partial small bowel obstruction   . Primary hyperparathyroidism     Lab Results Component Value Date  PTH 150.7* 02/13/2013  CALCIUM 11.0* 02/13/2013  CAION 1.21 03/15/2008     Past Surgical History  Procedure Laterality Date  . Ptca  2008, 2009, 2012    with DES  . Partial small bowel obstruction  2009  . Bilateral cataracts    . Pacemaker insertion      DDD/[employer]         Last interrogation 2/13  on chart     Pacemaker guideline order Dr Tamala Julian on chart  . Inguinal hernia repair Bilateral   . Lumbar disc surgery  12/2008  . Coronary artery bypass graft    . Total hip arthroplasty  08/21/2011    Procedure: TOTAL HIP ARTHROPLASTY;  Surgeon: Johnn Hai, MD;  Location: [facility];  Service: Orthopedics;  Laterality: Right;  . Colonoscopy    . Esophagogastroduodenoscopy    . Penile prosthesis placement     Family History  Problem Relation Age of Onset  .  Colon cancer Neg Hx    History  Substance Use Topics  . Smoking status: Former Smoker    Quit date: 06/09/1995  . Smokeless tobacco: Never Used     Comment: Retired, lives alone, but has a lady friend  . Alcohol Use: No    Review of Systems  Constitutional: Negative for fever.  Respiratory: Positive for shortness of breath. Negative for cough.   Cardiovascular: Negative for chest pain.  Gastrointestinal: Positive for heartburn, nausea, abdominal pain, diarrhea and anorexia. Negative for vomiting and constipation.  Genitourinary: Negative for dysuria.  All other systems reviewed and are negative.      Allergies  Actos; Celebrex; Demerol; Morphine and related; Ciprofloxacin; Metformin; and Zocor  Home Medications   Current Outpatient Rx  Name   Route  Sig  Dispense  Refill  . albuterol (PROVENTIL HFA) 108 (90 BASE) MCG/ACT inhaler   Inhalation   Inhale 2 puffs into the lungs every 4 (four) hours as needed. Wheezing          . albuterol (PROVENTIL) (2.5 MG/3ML) 0.083% nebulizer solution   Nebulization   Take 3 mLs (2.5 mg total) by nebulization every 4 (four) hours as needed (Dx: COPD 496). Wheezing   75 mL   5   . albuterol-ipratropium (COMBIVENT) 18-103 MCG/ACT inhaler   Inhalation   Inhale 2 puffs into the lungs every 6 (six) hours as needed. Wheezing          . aluminum & magnesium hydroxide (MAALOX) 225-200 MG/5ML suspension   Oral   Take 15 mLs by mouth every 6 (six) hours as needed. Heart burn          . bumetanide (BUMEX) 1 MG tablet   Oral   Take 1 mg by mouth daily after breakfast.          . clopidogrel (PLAVIX) 75 MG tablet   Oral   Take 75 mg by mouth daily after breakfast.          . esomeprazole (NEXIUM) 40 MG capsule   Oral   Take 1 capsule (40 mg total) by mouth daily at 12 noon.   20 capsule   0     Samples given to patient    Lot#     [id number]        ...   . fluticasone (FLONASE) 50 MCG/ACT nasal spray   Nasal   Place 2 sprays into the nose daily. Allergies   16 g   1   . HYDROcodone-acetaminophen (NORCO) 7.5-325 MG per tablet   Oral   Take 1-2 tablets by mouth every 4 (four) hours as needed.         . isosorbide mononitrate (IMDUR) 60 MG 24 hr tablet   Oral   Take 1 tablet (60 mg total) by mouth every evening.         . Niacin CR 1000 MG TBCR   Oral   Take 1,000 mg by mouth at bedtime.          . nitroGLYCERIN (NITROSTAT) 0.4 MG SL tablet   Sublingual   Place 0.4 mg under the tongue every 5 (five) minutes as needed. Chest pain          . Polyethyl Glycol-Propyl Glycol (SYSTANE) 0.4-0.3 % SOLN   Ophthalmic   Apply 2 drops to eye at bedtime.         . polyethylene glycol (MIRALAX / GLYCOLAX) packet   Oral   Take 17 g by mouth daily  as needed.  Constipation          . pravastatin (PRAVACHOL) 20 MG tablet   Oral   Take 60 mg by mouth at bedtime.          . senna (SENOKOT) 8.6 MG tablet      Alternates with one tablet one day then two tablets the next day         . valsartan (DIOVAN) 160 MG tablet   Oral   Take 1 tablet (160 mg total) by mouth 2 (two) times daily.          BP 116/49  Pulse 70  Temp(Src) 98.5 F (36.9 C) (Oral)  Resp 15  SpO2 96% Physical Exam  Nursing note and vitals reviewed. Constitutional: He is oriented to person, place, and time. He appears well-developed and well-nourished. No distress.  HENT:  Head: Normocephalic and atraumatic.  Mouth/Throat: Oropharynx is clear and moist.  Eyes: Conjunctivae and EOM are normal. Pupils are equal, round, and reactive to light.  Neck: Normal range of motion. Neck supple.  Cardiovascular: Normal rate, regular rhythm and intact distal pulses.   No murmur heard. Pulmonary/Chest: Effort normal and breath sounds normal. No respiratory distress. He has no wheezes. He has no rales.  Abdominal: Soft. He exhibits no distension. There is tenderness in the right upper quadrant, epigastric area and left upper quadrant. There is guarding. There is no rebound and negative Murphy's sign.  Musculoskeletal: Normal range of motion. He exhibits no edema and no tenderness.  Neurological: He is alert and oriented to person, place, and time.  Skin: Skin is warm and dry. No rash noted. No erythema.  Psychiatric: He has a normal mood and affect. His behavior is normal.    ED Course  Procedures (including critical care time) Labs Review Labs Reviewed  CBC WITH DIFFERENTIAL - Abnormal; Notable for the following:    RBC 3.78 (*)    Hemoglobin 12.6 (*)    HCT 37.6 (*)    Neutrophils Relative % 79 (*)    Lymphocytes Relative 7 (*)    Lymphs Abs 0.5 (*)    All other components within normal limits  COMPREHENSIVE METABOLIC PANEL - Abnormal; Notable for the following:     Glucose, Bld 125 (*)    GFR calc non Af Amer 76 (*)    GFR calc Af Amer 88 (*)    All other components within normal limits  URINALYSIS, ROUTINE W REFLEX MICROSCOPIC  LIPASE, BLOOD  POCT I-STAT TROPONIN I   Imaging Review Dg Chest 2 View  07/17/2013   CLINICAL DATA:  Abdominal pain.  Heartburn.  EXAM: CHEST  2 VIEW  COMPARISON:  01/25/2013.  FINDINGS: Sequential pacemaker enters from the left with leads unchanged in position at the level of the right atrium and right ventricle. Post CABG. Cardiomegaly.  Calcified tortuous aorta.  Mild central pulmonary vascular prominence stable.  Basilar scarring/subsegmental atelectasis without segmental consolidation.  No pneumothorax.  IMPRESSION: Sequential pacemaker in place.  Post CABG. Cardiomegaly.  Calcified tortuous aorta.  Mild central pulmonary vascular prominence stable.  Basilar scarring/subsegmental atelectasis without segmental consolidation.   Electronically Signed   By: Chauncey Cruel M.D.   On: 07/17/2013 16:32   US Abdomen Complete  07/17/2013   CLINICAL DATA:  Right upper quadrant and epigastric pain.  EXAM: ULTRASOUND ABDOMEN COMPLETE  COMPARISON:  CT ABD/PELVIS W CM dated 06/25/2012; US ABDOMEN COMPLETE dated 09/18/2006  FINDINGS: Gallbladder:  No gallstones or wall thickening visualized. No sonographic  Murphy sign noted.  Common bile duct:  Diameter: 4 mm, within normal limits.  Liver:  Anechoic lesions with increased through transmission in the liver measure up to 3.9 x 3.2 x 4.0 cm. Largest lesions contains internal septations and may be minimally larger than on 09/18/2006. Question mild increase in hepatic parenchymal echogenicity.  IVC:  No abnormality visualized.  Pancreas:  Obscured by bowel gas.  Spleen:  8.3 cm, negative.  Right Kidney:  Length: 11.2 cm. Parenchymal echogenicity is within normal limits. No hydronephrosis.  Left Kidney:  Length: 11.5 cm. Parenchymal echogenicity is within normal limits. No hydronephrosis.  Abdominal aorta:   Portions are obscured by bowel gas.  No definite aneurysm.  Other findings:  None.  IMPRESSION: 1. No acute findings to explain the patient's given symptoms. 2. Liver appears fatty with scattered cysts, the largest of which appears mildly complex.   Electronically Signed   By: Lorin Picket M.D.   On: 07/17/2013 18:35      Date: 07/17/2013  Rate: 76  Rhythm: normal sinus rhythm  QRS Axis: left  Intervals: normal  ST/T Wave abnormalities: nonspecific ST changes  Conduction Disutrbances:none  Narrative Interpretation:   Old EKG Reviewed: unchanged    MDM   Final diagnoses:  Atypical chest pain   Patient with a history of coronary artery disease status post CABG in 3 cardiac stents 2 years ago also with GERD and GI complaints who presents with epigastric pain that radiates into his back and bilateral upper quadrants that's been intermittent and worse over the last 2 weeks. He was seen by cardiology today and sent here for further evaluation. Patient's EKG is unchanged from prior. He does have reproducible epigastric and upper quadrant abdominal pain upon palpation. He currently is in no acute distress with normal vital signs. Symptoms appear to be worse during the day and better when he lays down to sleep at night.  Concern for cardiac etiology versus GI pathology. Vital minutes it does not appear that patient has never had a cholecystectomy so RUQ to eval for gallbladder etiology. Initial troponin is negative. CMP, lipase, UA, chest x-ray, right upper quadrant ultrasound pending.  If no GI pathology found will discuss with cards.  12:22 AM GI work up neg.  Discussed with cards who will admit for r/o.  Blanchie Dessert, MD 07/18/13 [phone]

## 2013-07-17 NOTE — ED Notes (Signed)
Pt came form cardiologist office today and was having indigestion/burning sensation in lower midchest, reports diarrhea that started today and some sob.  Pt states that he wears 02 continuously.  Pt has 3 cardiac stents.

## 2013-07-18 ENCOUNTER — Encounter (HOSPITAL_COMMUNITY): Payer: Medicare Other

## 2013-07-18 ENCOUNTER — Encounter (HOSPITAL_COMMUNITY): Payer: Self-pay | Admitting: General Practice

## 2013-07-18 ENCOUNTER — Inpatient Hospital Stay (HOSPITAL_COMMUNITY): Payer: Medicare Other

## 2013-07-18 DIAGNOSIS — I2510 Atherosclerotic heart disease of native coronary artery without angina pectoris: Secondary | ICD-10-CM | POA: Diagnosis not present

## 2013-07-18 DIAGNOSIS — R079 Chest pain, unspecified: Secondary | ICD-10-CM | POA: Diagnosis not present

## 2013-07-18 DIAGNOSIS — R0789 Other chest pain: Secondary | ICD-10-CM | POA: Diagnosis not present

## 2013-07-18 DIAGNOSIS — Z950 Presence of cardiac pacemaker: Secondary | ICD-10-CM | POA: Diagnosis not present

## 2013-07-18 DIAGNOSIS — K219 Gastro-esophageal reflux disease without esophagitis: Secondary | ICD-10-CM | POA: Diagnosis not present

## 2013-07-18 LAB — CBC
HEMATOCRIT: 37.4 % — AB (ref 39.0–52.0)
Hemoglobin: 12.7 g/dL — ABNORMAL LOW (ref 13.0–17.0)
MCH: 33.9 pg (ref 26.0–34.0)
MCHC: 34 g/dL (ref 30.0–36.0)
MCV: 99.7 fL (ref 78.0–100.0)
Platelets: 131 10*3/uL — ABNORMAL LOW (ref 150–400)
RBC: 3.75 MIL/uL — AB (ref 4.22–5.81)
RDW: 12.4 % (ref 11.5–15.5)
WBC: 4.7 10*3/uL (ref 4.0–10.5)

## 2013-07-18 LAB — CREATININE, SERUM
Creatinine, Ser: 0.88 mg/dL (ref 0.50–1.35)
GFR calc Af Amer: 89 mL/min — ABNORMAL LOW (ref >90)
GFR calc non Af Amer: 77 mL/min — ABNORMAL LOW (ref >90)

## 2013-07-18 MED ORDER — REGADENOSON 0.4 MG/5ML IV SOLN
INTRAVENOUS | Status: AC
  Administered 2013-07-18: 0.4 mg via INTRAVENOUS
  Filled 2013-07-18: qty 5

## 2013-07-18 MED ORDER — HEPARIN SODIUM (PORCINE) 5000 UNIT/ML IJ SOLN
5000.0000 [IU] | Freq: Three times a day (TID) | INTRAMUSCULAR | Status: DC
  Administered 2013-07-18: 5000 [IU] via SUBCUTANEOUS
  Filled 2013-07-18 (×3): qty 1

## 2013-07-18 MED ORDER — TECHNETIUM TC 99M SESTAMIBI GENERIC - CARDIOLITE
30.0000 | Freq: Once | INTRAVENOUS | Status: AC | PRN
  Administered 2013-07-18: 30 via INTRAVENOUS

## 2013-07-18 MED ORDER — TECHNETIUM TC 99M TETROFOSMIN IV KIT
10.0000 | PACK | Freq: Once | INTRAVENOUS | Status: AC | PRN
  Administered 2013-07-18: 10 via INTRAVENOUS

## 2013-07-18 NOTE — Progress Notes (Signed)
Patient ambulated in hallway with no CP or SOB.  Will continue to monitor. [place], Ardeth Sportsman

## 2013-07-18 NOTE — Discharge Summary (Signed)
Physician Discharge Summary  Patient ID: Eric [name] MRN: [id number] DOB/AGE: 12-07-1926 81 y.o.  Admit date: 07/17/2013 Discharge date: 07/18/2013  Admission Diagnoses: Chest Pain  Discharge Diagnoses:  Active Problems:   DIABETES MELLITUS-TYPE II   HYPERLIPIDEMIA   OBESITY   CORONARY ARTERY DISEASE   CARDIOMYOPATHY, ISCHEMIC   GERD   HIATAL HERNIA   Pacemaker   Chest pain   Discharged Condition: stable  Hospital Course: The patient is a 80 year old male with a history of bypass surgery, coronary artery disease,  which in 2012 he underwent cardiac catheterization with stenting of his vein graft to obtuse marginal. He had normal LV function at that time. He also has a PPM and is followed by Dr. Caryl Comes. On 07/17/13, he was seen by Dr. Caryl Comes in clinic for routine follow-up and had endorsed intermittent chest pain symptoms x 2 weeks, described as epigastric, burning pain with radiation around his right flank. Given his history of CAD, he was advised to go to the ER. He received a GI cocktail and had no improvement in symptoms. He underwent ultrasound of his gallbladder as well as other lab work including lipase, liver functions all of which were unremarkable. Troponin at first check was normal. He was admitted for observation. He underwent a NST that showed no ischemia. EF was estimated at 64%. His pain was determined noncardiac. He was last seen and examined by Dr. Harrington Challenger, who determined he was stable for discharge home. He will f/u in clinic in 1-2 weeks.   Consults: None  Significant Diagnostic Studies:   NST 07/18/13 FINDINGS: ECG: SR ICRBBB Q in lead 3 poor R wave progression no changes with infusion  Symptoms: Mild dyspnea and nausea  RAW Data: Motion  QPS: 4  Quantitative Gated SPECT EF: 64%  Perfusion Images: Small inferolateral wall infarct from apex to base no ischemia  IMPRESSION: Low risk myovue Small inferolateral wall infarct from apex to base no ischemia EF  64%  Treatments: See Hospital Course  Discharge Exam: Blood pressure 164/71, pulse 60, temperature 97.8 F (36.6 C), temperature source Oral, resp. rate 18, height 5' 6.5" (1.689 m), weight 250 lb (113.4 kg), SpO2 98.00%.   Disposition: 01-Home or Self Care  Discharge Orders   Future Appointments Provider Department Dept Phone   07/25/2013 11:00 AM Rowe Clack, MD [facility] [place] [phone]   09/14/2013 11:00 AM Deneise Lever, MD [facility] [phone]   Future Orders Complete By Expires   Diet - low sodium heart healthy  As directed    Increase activity slowly  As directed        Medication List         albuterol-ipratropium 18-103 MCG/ACT inhaler  Commonly known as:  COMBIVENT  - Inhale 2 puffs into the lungs every 6 (six) hours as needed. Wheezing  -      aluminum & magnesium hydroxide 225-200 MG/5ML suspension  Commonly known as:  MAALOX  - Take 15 mLs by mouth every 6 (six) hours as needed. Heart burn  -      bumetanide 1 MG tablet  Commonly known as:  BUMEX  Take 1 mg by mouth daily after breakfast.     clopidogrel 75 MG tablet  Commonly known as:  PLAVIX  Take 75 mg by mouth daily after breakfast.     esomeprazole 40 MG capsule  Commonly known as:  NEXIUM  Take 1 capsule (40 mg total) by mouth daily at 12 noon.  fluticasone 50 MCG/ACT nasal spray  Commonly known as:  FLONASE  Place 2 sprays into the nose daily. Allergies     HYDROcodone-acetaminophen 7.5-325 MG per tablet  Commonly known as:  NORCO  Take 1-2 tablets by mouth every 4 (four) hours as needed.     isosorbide mononitrate 60 MG 24 hr tablet  Commonly known as:  IMDUR  Take 1 tablet (60 mg total) by mouth every evening.     Niacin CR 1000 MG Tbcr  Take 1,000 mg by mouth at bedtime.     nitroGLYCERIN 0.4 MG SL tablet  Commonly known as:  NITROSTAT  - Place 0.4 mg under the tongue every 5 (five) minutes as needed. Chest pain  -       polyethylene glycol packet  Commonly known as:  MIRALAX / GLYCOLAX  - Take 17 g by mouth daily as needed. Constipation  -      pravastatin 20 MG tablet  Commonly known as:  PRAVACHOL  Take 60 mg by mouth at bedtime.     PROVENTIL HFA 108 (90 BASE) MCG/ACT inhaler  Generic drug:  albuterol  - Inhale 2 puffs into the lungs every 4 (four) hours as needed. Wheezing  -      albuterol (2.5 MG/3ML) 0.083% nebulizer solution  Commonly known as:  PROVENTIL  Take 3 mLs (2.5 mg total) by nebulization every 4 (four) hours as needed (Dx: COPD 496). Wheezing     senna 8.6 MG tablet  Commonly known as:  SENOKOT  Alternates with one tablet one day then two tablets the next day     SYSTANE 0.4-0.3 % Soln  Generic drug:  Polyethyl Glycol-Propyl Glycol  Apply 2 drops to eye at bedtime.     valsartan 160 MG tablet  Commonly known as:  DIOVAN  Take 1 tablet (160 mg total) by mouth 2 (two) times daily.           Follow-up Information   Follow up with Sinclair Grooms, MD. (call our office to schedule an appointment for follow-up)    Specialty:  Cardiology   Contact information:   [address] [address] [phone]      TIME SPENT ON DISCHARGE, INCLUDING PHYSICIAN TIME: >30 MINUTES Signed: Lyda Jester 07/18/2013, 5:26 PM

## 2013-07-18 NOTE — Discharge Summary (Signed)
Agree with plans as noted. 

## 2013-07-18 NOTE — Care Management Note (Unsigned)
    Page 1 of 1   07/18/2013     4:25:42 PM   CARE MANAGEMENT NOTE 07/18/2013  Patient:  Eric [name], Eric [name]   Account Number:  [id number]  Date Initiated:  07/18/2013  Documentation initiated by:  GRAVES-BIGELOW,Simisola Sandles  Subjective/Objective Assessment:   Pt admitted for chest pain. Direct admit from office. Pt in for 2 day stress test.     Action/Plan:   CM will continue to monitor for disposition needs.   Anticipated DC Date:  07/19/2013   Anticipated DC Plan:  [place]  CM consult      Choice offered to / List presented to:             Status of service:  In process, will continue to follow Medicare Important Message given?   (If response is "NO", the following Medicare IM given date fields will be blank) Date Medicare IM given:   Date Additional Medicare IM given:    Discharge Disposition:    Per UR Regulation:  Reviewed for med. necessity/level of care/duration of stay  If discussed at [place] of Stay Meetings, dates discussed:    Comments:

## 2013-07-18 NOTE — Progress Notes (Signed)
DC orders received.  Patient stable with no S/S of distress.  Medication and discharge information reviewed with patient.  patient DC home. [place], Ardeth Sportsman

## 2013-07-18 NOTE — Progress Notes (Signed)
Subjective: Reports generalized malaise. No chest discomfort.   Objective: Vital signs in last 24 hours: Temp:  [97.5 F (36.4 C)-98.5 F (36.9 C)] 97.7 F (36.5 C) (02/10 0456) Pulse Rate:  [58-75] 58 (02/10 0456) Resp:  [15-22] 18 (02/10 0456) BP: (115-166)/(42-79) 131/79 mmHg (02/10 0456) SpO2:  [94 %-99 %] 97 % (02/10 0456) Weight:  [250 lb (113.4 kg)] 250 lb (113.4 kg) (02/09 2154)    Intake/Output from previous day:   Intake/Output this shift:    Medications Current Facility-Administered Medications  Medication Dose Route Frequency Provider Last Rate Last Dose  . albuterol (PROVENTIL) (2.5 MG/3ML) 0.083% nebulizer solution 2.5 mg  2.5 mg Nebulization Q4H PRN Perry Mount, PA-C      . alum & mag hydroxide-simeth (MAALOX/MYLANTA) 200-200-20 MG/5ML suspension 15 mL  15 mL Oral Q6H PRN Candee Furbish, MD      . bumetanide (BUMEX) tablet 1 mg  1 mg Oral QPC breakfast Perry Mount, PA-C      . clopidogrel (PLAVIX) tablet 75 mg  75 mg Oral QPC breakfast Perry Mount, PA-C      . fluticasone [facility]) 50 MCG/ACT nasal spray 2 spray  2 spray Each Nare Daily Perry Mount, PA-C      . HYDROcodone-acetaminophen (NORCO) 7.5-325 MG per tablet 1-2 tablet  1-2 tablet Oral Q4H PRN Perry Mount, PA-C      . ipratropium-albuterol (DUONEB) 0.5-2.5 (3) MG/3ML nebulizer solution 3 mL  3 mL Nebulization Q6H PRN Candee Furbish, MD      . irbesartan (AVAPRO) tablet 150 mg  150 mg Oral Daily Perry Mount, PA-C      . isosorbide mononitrate (IMDUR) 24 hr tablet 60 mg  60 mg Oral QPM Perry Mount, PA-C      . niacin CR capsule 1,000 mg  1,000 mg Oral QHS Candee Furbish, MD      . nitroGLYCERIN (NITROSTAT) SL tablet 0.4 mg  0.4 mg Sublingual Q5 min PRN Blanchie Dessert, MD      . pantoprazole (PROTONIX) EC tablet 80 mg  80 mg Oral Q1200 Perry Mount, PA-C      . polyethylene glycol (MIRALAX / GLYCOLAX) packet 17 g  17 g Oral Daily PRN Perry Mount, PA-C      . polyvinyl alcohol (LIQUIFILM TEARS)  1.4 % ophthalmic solution 2 drop  2 drop Both Eyes QHS Perry Mount, PA-C   2 drop at 07/17/13 2342  . regadenoson (LEXISCAN) injection SOLN 0.4 mg  0.4 mg Intravenous Once Perry Mount, PA-C      . senna (SENOKOT) tablet 8.6 mg  1 tablet Oral QHS PRN Perry Mount, PA-C      . simvastatin (ZOCOR) tablet 10 mg  10 mg Oral q1800 Perry Mount, PA-C        PE: General appearance: alert, cooperative and no distress Lungs: clear to auscultation bilaterally Heart: regular rate and rhythm Extremities: no LEE Pulses: 2+ and symmetric Skin: warm and dry Neurologic: Grossly normal  Lab Results:   Recent Labs  07/17/13 1535  WBC 6.7  HGB 12.6*  HCT 37.6*  PLT 156   BMET  Recent Labs  07/17/13 1535  NA 137  K 4.2  CL 99  CO2 22  GLUCOSE 125*  BUN 17  CREATININE 0.91  CALCIUM 10.5    Studies/Results:  Lexiscan NST- pending   Assessment/Plan  Active Problems:   DIABETES MELLITUS-TYPE II   HYPERLIPIDEMIA   OBESITY   CORONARY ARTERY DISEASE   CARDIOMYOPATHY, ISCHEMIC   GERD  HIATAL HERNIA   Pacemaker   Chest pain  Plan: It appears that troponins were never cycled x 3. POC troponin in the ER was negative, however. Plan is for 2 day NST to evaluate for ischemia. Stress portion was completed today. He had mild SOB SSCP and nausea after injection of Lexiscan. Symptoms resolved by completion of test. No significant ischemic changes. Radiologist interpretation to follow. Continue on Plavix and Imdur.     LOS: 1 day    Brittainy M. Ladoris Gene 07/18/2013 8:12 AM  patinet seen and examined  Please see my additional note for today.    Dorris Carnes

## 2013-07-18 NOTE — Progress Notes (Signed)
UR Completed Carliss Porcaro Graves-Bigelow, RN,BSN [phone]  

## 2013-07-18 NOTE — Progress Notes (Signed)
Subjective: Patient with on and off CP   Objective: Filed Vitals:   07/18/13 0921 07/18/13 0923 07/18/13 0925 07/18/13 0927  BP: 146/54 151/60 138/63 126/62  Pulse:      Temp:      TempSrc:      Resp:      Height:      Weight:      SpO2:       Weight change:  No intake or output data in the 24 hours ending 07/18/13 1021  General: Alert, awake, oriented x3, in no acute distress Neck:  JVP is normal Chest  Tender to palpitation in sternal and epigasdtric area  Heart: Regular rate and rhythm, without murmurs, rubs, gallops.  Lungs: Clear to auscultation.  No rales or wheezes. Exemities:  No edema.   Neuro: Grossly intact, nonfocal.  Tele:  SR  Lab Results: Results for orders placed during the hospital encounter of 07/17/13 (from the past 24 hour(s))  CBC WITH DIFFERENTIAL     Status: Abnormal   Collection Time    07/17/13  3:35 PM      Result Value Range   WBC 6.7  4.0 - 10.5 K/uL   RBC 3.78 (*) 4.22 - 5.81 MIL/uL   Hemoglobin 12.6 (*) 13.0 - 17.0 g/dL   HCT 37.6 (*) 39.0 - 52.0 %   MCV 99.5  78.0 - 100.0 fL   MCH 33.3  26.0 - 34.0 pg   MCHC 33.5  30.0 - 36.0 g/dL   RDW 12.5  11.5 - 15.5 %   Platelets 156  150 - 400 K/uL   Neutrophils Relative % 79 (*) 43 - 77 %   Neutro Abs 5.3  1.7 - 7.7 K/uL   Lymphocytes Relative 7 (*) 12 - 46 %   Lymphs Abs 0.5 (*) 0.7 - 4.0 K/uL   Monocytes Relative 10  3 - 12 %   Monocytes Absolute 0.7  0.1 - 1.0 K/uL   Eosinophils Relative 4  0 - 5 %   Eosinophils Absolute 0.3  0.0 - 0.7 K/uL   Basophils Relative 0  0 - 1 %   Basophils Absolute 0.0  0.0 - 0.1 K/uL  COMPREHENSIVE METABOLIC PANEL     Status: Abnormal   Collection Time    07/17/13  3:35 PM      Result Value Range   Sodium 137  137 - 147 mEq/L   Potassium 4.2  3.7 - 5.3 mEq/L   Chloride 99  96 - 112 mEq/L   CO2 22  19 - 32 mEq/L   Glucose, Bld 125 (*) 70 - 99 mg/dL   BUN 17  6 - 23 mg/dL   Creatinine, Ser 0.91  0.50 - 1.35 mg/dL   Calcium 10.5  8.4 - 10.5 mg/dL   Total  Protein 7.3  6.0 - 8.3 g/dL   Albumin 4.0  3.5 - 5.2 g/dL   AST 28  0 - 37 U/L   ALT 21  0 - 53 U/L   Alkaline Phosphatase 69  39 - 117 U/L   Total Bilirubin 0.3  0.3 - 1.2 mg/dL   GFR calc non Af Amer 76 (*) >90 mL/min   GFR calc Af Amer 88 (*) >90 mL/min  LIPASE, BLOOD     Status: None   Collection Time    07/17/13  3:35 PM      Result Value Range   Lipase 12  11 - 59 U/L  POCT I-STAT TROPONIN I  Status: None   Collection Time    07/17/13  3:51 PM      Result Value Range   Troponin i, poc 0.01  0.00 - 0.08 ng/mL   Comment 3           URINALYSIS, ROUTINE W REFLEX MICROSCOPIC     Status: None   Collection Time    07/17/13  8:25 PM      Result Value Range   Color, Urine YELLOW  YELLOW   APPearance CLEAR  CLEAR   Specific Gravity, Urine 1.025  1.005 - 1.030   pH 6.5  5.0 - 8.0   Glucose, UA NEGATIVE  NEGATIVE mg/dL   Hgb urine dipstick NEGATIVE  NEGATIVE   Bilirubin Urine NEGATIVE  NEGATIVE   Ketones, ur NEGATIVE  NEGATIVE mg/dL   Protein, ur NEGATIVE  NEGATIVE mg/dL   Urobilinogen, UA 0.2  0.0 - 1.0 mg/dL   Nitrite NEGATIVE  NEGATIVE   Leukocytes, UA NEGATIVE  NEGATIVE    Studies/Results: @RISRSLT24 @  Medications:  Revoewed   @PROBHOSP @  1.  CP  R/O so far for MI  For Myoview today.  Myoview showed small inferolateral scar  No ischemia.  Patient continues to have intermitt pain.  He thinks it started about 4 wks ago when Dr Buddy Duty prescribed med (?Na tablet)  He says he will not take anymore.  On exam he is tender to palpation  ? Costochondritis.   ? GI as well Started back on Nexium on Fri  He thinks he did better on it.   I would resume Nexium  Follow  Try to find out new med Ambulate.  If no change in symptoms then d/c with outpatient f/u   2.  CAD  Continue Plavix  Plan stress test  3.  HL  Does not want statin  COnsider Zetia as outpatient    4.  Obesity    4.  DM    6.  PPM    LOS: 1 day   Dorris Carnes 07/18/2013, 10:21 AM

## 2013-07-21 ENCOUNTER — Telehealth: Payer: Self-pay | Admitting: Interventional Cardiology

## 2013-07-21 NOTE — Telephone Encounter (Signed)
Updated pt electric blankets are okay w/ pacemakers as long as no magnetic components present.

## 2013-07-21 NOTE — Telephone Encounter (Signed)
New message    Patient has a pacemaker--is it ok to sleep under an electric blanket?

## 2013-07-25 ENCOUNTER — Ambulatory Visit: Payer: Medicare Other | Admitting: Internal Medicine

## 2013-07-26 ENCOUNTER — Other Ambulatory Visit (INDEPENDENT_AMBULATORY_CARE_PROVIDER_SITE_OTHER): Payer: Medicare Other

## 2013-07-26 ENCOUNTER — Ambulatory Visit (INDEPENDENT_AMBULATORY_CARE_PROVIDER_SITE_OTHER): Payer: Medicare Other | Admitting: Internal Medicine

## 2013-07-26 ENCOUNTER — Encounter: Payer: Self-pay | Admitting: Internal Medicine

## 2013-07-26 VITALS — BP 110/68 | HR 75 | Temp 97.8°F | Wt 246.0 lb

## 2013-07-26 DIAGNOSIS — K219 Gastro-esophageal reflux disease without esophagitis: Secondary | ICD-10-CM | POA: Diagnosis not present

## 2013-07-26 DIAGNOSIS — E119 Type 2 diabetes mellitus without complications: Secondary | ICD-10-CM

## 2013-07-26 DIAGNOSIS — I10 Essential (primary) hypertension: Secondary | ICD-10-CM | POA: Diagnosis not present

## 2013-07-26 DIAGNOSIS — E785 Hyperlipidemia, unspecified: Secondary | ICD-10-CM

## 2013-07-26 DIAGNOSIS — I2510 Atherosclerotic heart disease of native coronary artery without angina pectoris: Secondary | ICD-10-CM | POA: Diagnosis not present

## 2013-07-26 LAB — HEMOGLOBIN A1C: HEMOGLOBIN A1C: 6.9 % — AB (ref 4.6–6.5)

## 2013-07-26 NOTE — Assessment & Plan Note (Signed)
Follows with Dr. Tamala Julian for same - Stent 08/2010 after evaluation for anginal symptoms, also hx CABG NST 07/2013 without ischemia, neg Trop I (in setting of obs for CP - noncardiac) The current medical regimen is effective;  continue present plan and medications.

## 2013-07-26 NOTE — Progress Notes (Signed)
Pre-visit discussion using our clinic review tool. No additional management support is needed unless otherwise documented below in the visit note.  

## 2013-07-26 NOTE — Assessment & Plan Note (Signed)
BP Readings from Last 3 Encounters:  07/26/13 110/68  07/18/13 164/71  07/17/13 120/60   Hx orthostatic symptoms if overtreated -  Monitor and adjust/resume as needed based on BP and symptoms  The current medical regimen is effective;  continue present plan and medications.

## 2013-07-26 NOTE — Assessment & Plan Note (Signed)
"  diet controlled" no longer follows with endo (prev kerr and kumar) - will refer back to [facility] end[facility] ([facility]) at pt request now actos caused edema/fluid retention and januvia stopped summer 2014 (?cost) Intolerant of metformin due to nausea and "hospitalization"  Lab Results  Component Value Date   HGBA1C 6.9* 09/30/2012

## 2013-07-26 NOTE — Assessment & Plan Note (Signed)
Recent hosp obs for CP 07/2013 - noncardiac, neg abd [place] Felt related to change in PPI - GERD NOT controlled with generic omeprazole 40mg  bid Working to approve change back to Nexium - GI ([facility]essner) working with [place] to authorize same - samples provided today

## 2013-07-26 NOTE — Assessment & Plan Note (Signed)
s/p DES 08/2010, hx same and CABG 1995 On statin - last lipids at goal, recheck annually The current medical regimen is effective;  continue present plan and medications.

## 2013-07-26 NOTE — Patient Instructions (Addendum)
It was good to see you today.  We have reviewed your prior records including labs and tests today  Medications reviewed and updated, No changes recommended - samples of nexium given to you today  Test(s) ordered today. Your results will be released to [place] (or called to you) after review, usually within 72hours after test completion. If any changes need to be made, you will be notified at that same time.  we'll make referral to [facility] endocrinology (in place of Dr Buddy Duty). Our office will contact you regarding appointment(s) once made.  Followup in 6 months, call sooner if problems  Diabetes and Standards of Medical Care  Diabetes is complicated. You may find that your diabetes team includes a dietitian, nurse, diabetes educator, eye doctor, and more. To help everyone know what is going on and to help you get the care you deserve, the following schedule of care was developed to help keep you on track. Below are the tests, exams, vaccines, medicines, education, and plans you will need. HbA1c test This test shows how well you have controlled your glucose over the past 2 3 months. It is used to see if your diabetes management plan needs to be adjusted.   It is performed at least 2 times a year if you are meeting treatment goals.  It is performed 4 times a year if therapy has changed or if you are not meeting treatment goals. Blood pressure test  This test is performed at every routine medical visit. The goal is less than 140/90 mmHg for most people, but 130/80 mmHg in some cases. Ask your health care provider about your goal. Dental exam  Follow up with the dentist regularly. Eye exam  If you are diagnosed with type 1 diabetes as a child, get an exam upon reaching the age of 21 years or older and have had diabetes for 3 5 years. Yearly eye exams are recommended after that initial eye exam.  If you are diagnosed with type 1 diabetes as an adult, get an exam within 5 years of diagnosis and  then yearly.  If you are diagnosed with type 2 diabetes, get an exam as soon as possible after the diagnosis and then yearly. Foot care exam  Visual foot exams are performed at every routine medical visit. The exams check for cuts, injuries, or other problems with the feet.  A comprehensive foot exam should be done yearly. This includes visual inspection as well as assessing foot pulses and testing for loss of sensation.  Check your feet nightly for cuts, injuries, or other problems with your feet. Tell your health care provider if anything is not healing. Kidney function test (urine microalbumin)  This test is performed once a year.  Type 1 diabetes: The first test is performed 5 years after diagnosis.  Type 2 diabetes: The first test is performed at the time of diagnosis.  A serum creatinine and estimated glomerular filtration rate (eGFR) test is done once a year to assess the level of chronic kidney disease (CKD), if present. Lipid profile (cholesterol, HDL, LDL, triglycerides)  Performed every 5 years for most people.  The goal for LDL is less than 100 mg/dL. If you are at high risk, the goal is less than 70 mg/dL.  The goal for HDL is 40 mg/dL 50 mg/dL for men and 50 mg/dL 60 mg/dL for women. An HDL cholesterol of 60 mg/dL or higher gives some protection against heart disease.  The goal for triglycerides is less than 150  mg/dL. Influenza vaccine, pneumococcal vaccine, and hepatitis B vaccine  The influenza vaccine is recommended yearly.  The pneumococcal vaccine is generally given once in a lifetime. However, there are some instances when another vaccination is recommended. Check with your health care provider.  The hepatitis B vaccine is also recommended for adults with diabetes. Diabetes self-management education  Education is recommended at diagnosis and ongoing as needed. Treatment plan  Your treatment plan is reviewed at every medical visit. Document Released:  03/22/2009 Document Revised: 01/25/2013 Document Reviewed: 10/25/2012 [facility] Patient Information 2014 [place].

## 2013-07-26 NOTE — Progress Notes (Signed)
Subjective:    Patient ID: Eric [name], male    DOB: 1927-01-06, 80 y.o.   MRN: [id number]  HPI  Patient here for follow up -Reviewed chronic medical issues and interval medical events - recent hosp overnight for CP - noncardiac with neg NST and trop x2, also neg abd [place]  Past Medical History  Diagnosis Date  . LACTOSE INTOLERANCE   . OBESITY   . CARDIOMYOPATHY, ISCHEMIC   . AORTIC SCLEROSIS   . SICK SINUS/ TACHY-BRADY SYNDROME 09/2007    s/p PPM st judes  . PERIPHERAL VASCULAR DISEASE   . CAROTID BRUIT, RIGHT 02/27/2008  . IBS (irritable bowel syndrome)   . ALLERGIC RHINITIS   . ANEMIA-NOS   . OA (osteoarthritis)   . COPD   . GERD   . HYPERLIPIDEMIA   . HIATAL HERNIA   . Diverticulosis   . Prostate cancer     seed implants 2004  . DIABETES MELLITUS-TYPE II     diet controlled  . CORONARY ARTERY DISEASE     CABG 1995, PTCA/DES 2008, 2009 and 08/2010  . HYPERTENSION   . Asthma   . Sleep apnea   . Partial small bowel obstruction   . Primary hyperparathyroidism     Lab Results Component Value Date  PTH 150.7* 02/13/2013  CALCIUM 11.0* 02/13/2013  CAION 1.21 03/15/2008      Review of Systems  Constitutional: Negative for fever and fatigue.  Respiratory: Negative for cough and shortness of breath.   Cardiovascular: Negative for chest pain and leg swelling.       Objective:   Physical Exam  There were no vitals taken for this visit. Wt Readings from Last 3 Encounters:  07/17/13 250 lb (113.4 kg)  04/21/13 250 lb (113.399 kg)  04/10/13 252 lb (114.306 kg)   Constitutional:  Overweight, but appears well-developed and well-nourished. No distress at rest- ambulates without cane today Neck: thick and short, Normal range of motion. Neck supple. No JVD present. No thyromegaly present.  Cardiovascular: Normal rate, regular rhythm and normal heart sounds.  No murmur heard. trace BLE edema Pulmonary/Chest: Effort normal and breath sounds diminished at bases. No respiratory  distress. no wheezes. Psychiatric: he has a minimally depressed/anxious mood and affect. behavior is normal. Judgment and insight normal.   Lab Results  Component Value Date   WBC 4.7 07/18/2013   HGB 12.7* 07/18/2013   HCT 37.4* 07/18/2013   PLT 131* 07/18/2013   GLUCOSE 125* 07/17/2013   CHOL 110 04/12/2013   TRIG 120 04/12/2013   HDL 33* 04/12/2013   LDLCALC 53 04/12/2013   ALT 21 07/17/2013   AST 28 07/17/2013   NA 137 07/17/2013   K 4.2 07/17/2013   CL 99 07/17/2013   CREATININE 0.88 07/18/2013   BUN 17 07/17/2013   CO2 22 07/17/2013   TSH 1.33 04/12/2013   PSA 0.02* 09/10/2006   INR 1.00 08/13/2011   HGBA1C 6.9* 09/30/2012   MICROALBUR 0.8 09/30/2012    Dg Chest 2 View  07/17/2013   CLINICAL DATA:  Abdominal pain.  Heartburn.  EXAM: CHEST  2 VIEW  COMPARISON:  01/25/2013.  FINDINGS: Sequential pacemaker enters from the left with leads unchanged in position at the level of the right atrium and right ventricle. Post CABG. Cardiomegaly.  Calcified tortuous aorta.  Mild central pulmonary vascular prominence stable.  Basilar scarring/subsegmental atelectasis without segmental consolidation.  No pneumothorax.  IMPRESSION: Sequential pacemaker in place.  Post CABG. Cardiomegaly.  Calcified tortuous aorta.  Mild central pulmonary vascular prominence stable.  Basilar scarring/subsegmental atelectasis without segmental consolidation.   Electronically Signed   By: Chauncey Cruel M.D.   On: 07/17/2013 16:32   US Abdomen Complete  07/17/2013   CLINICAL DATA:  Right upper quadrant and epigastric pain.  EXAM: ULTRASOUND ABDOMEN COMPLETE  COMPARISON:  CT ABD/PELVIS W CM dated 06/25/2012; US ABDOMEN COMPLETE dated 09/18/2006  FINDINGS: Gallbladder:  No gallstones or wall thickening visualized. No sonographic Murphy sign noted.  Common bile duct:  Diameter: 4 mm, within normal limits.  Liver:  Anechoic lesions with increased through transmission in the liver measure up to 3.9 x 3.2 x 4.0 cm. Largest lesions contains internal  septations and may be minimally larger than on 09/18/2006. Question mild increase in hepatic parenchymal echogenicity.  IVC:  No abnormality visualized.  Pancreas:  Obscured by bowel gas.  Spleen:  8.3 cm, negative.  Right Kidney:  Length: 11.2 cm. Parenchymal echogenicity is within normal limits. No hydronephrosis.  Left Kidney:  Length: 11.5 cm. Parenchymal echogenicity is within normal limits. No hydronephrosis.  Abdominal aorta:  Portions are obscured by bowel gas.  No definite aneurysm.  Other findings:  None.  IMPRESSION: 1. No acute findings to explain the patient's given symptoms. 2. Liver appears fatty with scattered cysts, the largest of which appears mildly complex.   Electronically Signed   By: Lorin Picket M.D.   On: 07/17/2013 18:35   Nm Myocar Multi W/spect W/wall Motion / Ef  07/18/2013   CLINICAL DATA:  Chest pain  EXAM: Lexiscan Myovue  TECHNIQUE: The patient received IV Lexiscan .4mg  over 15 seconds. 33.0 mCi of Technetium 37m Sestamibi injected at 30 seconds. Quantitative SPECT images were obtained in the vertical, horizontal and short axis planes after a 45 minute delay. Rest images were obtained with similar planes and delay using 10.2 mCi of Technetium 85m Sestamibi.  FINDINGS: ECG: SR ICRBBB Q in lead 3 poor R wave progression no changes with infusion  Symptoms:  Mild dyspnea and nausea  RAW Data: Motion  QPS: 4  Quantitative Gated SPECT EF: 64%  Perfusion Images: Small inferolateral wall infarct from apex to base no ischemia  IMPRESSION: Low risk myovue Small inferolateral wall infarct from apex to base no ischemia EF 64%  Jenkins Rouge   Electronically Signed   By: Jenkins Rouge M.D.   On: 07/18/2013 14:52       Assessment & Plan:   Problem List Items Addressed This Visit   CORONARY ARTERY DISEASE     Follows with Dr. Tamala Julian for same - Stent 08/2010 after evaluation for anginal symptoms, also hx CABG NST 07/2013 without ischemia, neg Trop I (in setting of obs for CP -  noncardiac) The current medical regimen is effective;  continue present plan and medications.     DIABETES MELLITUS-TYPE II      "diet controlled" no longer follows with endo (prev kerr and kumar) - will refer back to [facility] endo (ger[facility]ie) at pt request now actos caused edema/fluid retention and januvia stopped summer 2014 (?cost) Intolerant of metformin due to nausea and "hospitalization"  Lab Results  Component Value Date   HGBA1C 6.9* 09/30/2012      Relevant Orders      Hemoglobin A1c      Ambulatory referral to Endocrinology   GERD - Primary     Recent hosp obs for CP 07/2013 - noncardiac, neg abd [place] Felt related to change in PPI - GERD NOT controlled with generic omeprazole  40mg  bid Working to approve change back to Nexium - GI ([facility]ner) working with [place] to authorize same - samples provided today    HYPERLIPIDEMIA     s/p DES 08/2010, hx same and CABG 1995 On statin - last lipids at goal, recheck annually The current medical regimen is effective;  continue present plan and medications.    HYPERTENSION      BP Readings from Last 3 Encounters:  07/26/13 110/68  07/18/13 164/71  07/17/13 120/60   Hx orthostatic symptoms if overtreated -  Monitor and adjust/resume as needed based on BP and symptoms  The current medical regimen is effective;  continue present plan and medications.

## 2013-07-28 ENCOUNTER — Telehealth: Payer: Self-pay | Admitting: *Deleted

## 2013-07-28 NOTE — Telephone Encounter (Signed)
Letter printed & mailed to patient's home address.

## 2013-07-28 NOTE — Telephone Encounter (Signed)
Ok to created requested note and fax to [place] on my behalf as requested by my patient

## 2013-07-28 NOTE — Telephone Encounter (Signed)
Patient phoned requesting that PCP fax the [place] so they will d/c sending him the following meds which PCP has d/c'ed ([facility] still [facility])    Amlodipine RX# [id number]   Ompeprazole RX# [id number]   Sertraline RX# [id number]   Alprazalam RX# [id number]  Please advise  CB# [phone]

## 2013-08-02 ENCOUNTER — Telehealth: Payer: Self-pay | Admitting: *Deleted

## 2013-08-02 NOTE — Telephone Encounter (Signed)
Patient had the main fax number & I provided him with the [phone] line.  Patient repeated number

## 2013-08-02 NOTE — Telephone Encounter (Signed)
Patient phoned regarding a form being faxed from [facility] (Dr. Milinda Pointer?) and requesting status of being completed and faxed back.  CB# [phone]

## 2013-08-02 NOTE — Telephone Encounter (Signed)
We haven't received any forms from Dr. Milinda Pointer. Pls give him out fax # [phone] that way we get it...Eric [name]

## 2013-08-08 ENCOUNTER — Ambulatory Visit: Payer: Medicare Other | Admitting: Internal Medicine

## 2013-08-08 DIAGNOSIS — M5137 Other intervertebral disc degeneration, lumbosacral region: Secondary | ICD-10-CM | POA: Diagnosis not present

## 2013-08-09 ENCOUNTER — Telehealth: Payer: Self-pay | Admitting: Internal Medicine

## 2013-08-09 ENCOUNTER — Encounter: Payer: Self-pay | Admitting: Interventional Cardiology

## 2013-08-09 ENCOUNTER — Ambulatory Visit (INDEPENDENT_AMBULATORY_CARE_PROVIDER_SITE_OTHER): Payer: Medicare Other | Admitting: Interventional Cardiology

## 2013-08-09 VITALS — BP 130/50 | HR 66 | Ht 66.0 in | Wt 244.0 lb

## 2013-08-09 DIAGNOSIS — I2510 Atherosclerotic heart disease of native coronary artery without angina pectoris: Secondary | ICD-10-CM

## 2013-08-09 DIAGNOSIS — R109 Unspecified abdominal pain: Secondary | ICD-10-CM

## 2013-08-09 DIAGNOSIS — E785 Hyperlipidemia, unspecified: Secondary | ICD-10-CM

## 2013-08-09 DIAGNOSIS — I10 Essential (primary) hypertension: Secondary | ICD-10-CM | POA: Diagnosis not present

## 2013-08-09 DIAGNOSIS — I495 Sick sinus syndrome: Secondary | ICD-10-CM | POA: Diagnosis not present

## 2013-08-09 DIAGNOSIS — J449 Chronic obstructive pulmonary disease, unspecified: Secondary | ICD-10-CM

## 2013-08-09 NOTE — Telephone Encounter (Signed)
Patient notified

## 2013-08-09 NOTE — Patient Instructions (Signed)
Your physician recommends that you continue on your current medications as directed. Please refer to the Current Medication list given to you today.  Your physician wants you to follow-up in: 1 year. You will receive a reminder letter in the mail two months in advance. If you don't receive a letter, please call our office to schedule the follow-up appointment.  

## 2013-08-09 NOTE — Telephone Encounter (Signed)
Patient reports a bitter sour taste "that comes up in my throat and mouth".  He is taking Nexium BID with no relief.  He has a follow up with you on 08/24/13.  Do you have any recommendations until his office visit on 3/19

## 2013-08-09 NOTE — Telephone Encounter (Signed)
Take 2 tablespoons of gaviscon before he eats (or use the maalox on his med list but not both)

## 2013-08-09 NOTE — Progress Notes (Signed)
Patient ID: Eric [name], male   DOB: [birth year], 80 y.o.   MRN: [id number]    [address] [place], [place]  [address] Phone: [phone] Fax:  [phone]  Date:  08/09/2013   ID:  Eric [name], DOB [birth year], MRN [id number]  PCP:  [name], MD   ASSESSMENT:  1. CAD with recent nonischemic nuclear perfusion study 2. Regurgitation and lower abdominal discomfort, being evaluated by gastroenterology 3. Gallstones  PLAN:  1. No specific further cardiac evaluation is indicated. 2. If he requires gallbladder surgery, the recent nuclear study which suggests that cardiovascular risk would be low. I would clear him and we would need to perform no additional testing.   SUBJECTIVE: Eric [name] is a 80 y.o. male who is having difficulty with lower abdominal discomfort and burning. He is also having postprandial regurgitation. He denies dysphagia. He has not had angina. The abdominal discomfort was evaluated for the possibility that it was cardiac. With this in mind, nuclear study was done and it was very low risk. LV function is normal. Overall he is doing well from a cardiac standpoint.   Wt Readings from Last 3 Encounters:  08/09/13 244 lb (110.678 kg)  07/26/13 246 lb (111.585 kg)  07/17/13 250 lb (113.4 kg)     Past Medical History  Diagnosis Date  . LACTOSE INTOLERANCE   . OBESITY   . CARDIOMYOPATHY, ISCHEMIC   . AORTIC SCLEROSIS   . SICK SINUS/ TACHY-BRADY SYNDROME 09/2007    s/p PPM st judes  . PERIPHERAL VASCULAR DISEASE   . CAROTID BRUIT, RIGHT 02/27/2008  . IBS (irritable bowel syndrome)   . ALLERGIC RHINITIS   . ANEMIA-NOS   . OA (osteoarthritis)   . COPD   . GERD   . HYPERLIPIDEMIA   . HIATAL HERNIA   . Diverticulosis   . Prostate cancer     seed implants 2004  . DIABETES MELLITUS-TYPE II     diet controlled  . CORONARY ARTERY DISEASE     CABG 1995, PTCA/DES 2008, 2009 and 08/2010  . HYPERTENSION   . Asthma   . Sleep apnea    . Partial small bowel obstruction   . Primary hyperparathyroidism     Lab Results Component Value Date  PTH 150.7* 02/13/2013  CALCIUM 11.0* 02/13/2013  CAION 1.21 03/15/2008      Current Outpatient Prescriptions  Medication Sig Dispense Refill  . albuterol (PROVENTIL HFA) 108 (90 BASE) MCG/ACT inhaler Inhale 2 puffs into the lungs every 4 (four) hours as needed. Wheezing       . albuterol (PROVENTIL) (2.5 MG/3ML) 0.083% nebulizer solution Take 3 mLs (2.5 mg total) by nebulization every 4 (four) hours as needed (Dx: COPD 496). Wheezing  75 mL  5  . albuterol-ipratropium (COMBIVENT) 18-103 MCG/ACT inhaler Inhale 2 puffs into the lungs every 6 (six) hours as needed. Wheezing       . aluminum & magnesium hydroxide (MAALOX) 225-200 MG/5ML suspension Take 15 mLs by mouth every 6 (six) hours as needed. Heart burn       . aspirin 325 MG EC tablet Take 325 mg by mouth daily.      . bumetanide (BUMEX) 1 MG tablet Take 1 mg by mouth daily after breakfast.       . clopidogrel (PLAVIX) 75 MG tablet Take 75 mg by mouth daily after breakfast.       . esomeprazole (NEXIUM) 40 MG capsule Take 1 capsule (40 mg total)  by mouth daily at 12 noon.  20 capsule  0  . fluticasone (FLONASE) 50 MCG/ACT nasal spray Place 2 sprays into the nose daily. Allergies  16 g  1  . HYDROcodone-acetaminophen (NORCO) 7.5-325 MG per tablet Take 1-2 tablets by mouth every 4 (four) hours as needed.      . isosorbide mononitrate (IMDUR) 60 MG 24 hr tablet Take 1 tablet (60 mg total) by mouth every evening.      . Niacin CR 1000 MG TBCR Take 1,000 mg by mouth at bedtime.       . nitroGLYCERIN (NITROSTAT) 0.4 MG SL tablet Place 0.4 mg under the tongue every 5 (five) minutes as needed. Chest pain       . Polyethyl Glycol-Propyl Glycol (SYSTANE) 0.4-0.3 % SOLN Apply 2 drops to eye at bedtime.      . polyethylene glycol (MIRALAX / GLYCOLAX) packet Take 17 g by mouth daily as needed. Constipation       . pravastatin (PRAVACHOL) 20 MG  tablet Take 60 mg by mouth at bedtime.       . senna (SENOKOT) 8.6 MG tablet Alternates with one tablet one day then two tablets the next day      . valsartan (DIOVAN) 160 MG tablet Take 1 tablet (160 mg total) by mouth 2 (two) times daily.       No current facility-administered medications for this visit.    Allergies:    Allergies  Allergen Reactions  . Actos [Pioglitazone Hydrochloride] Other (See Comments)    "felt funny, drowsy, and weak":  . Celebrex [Celecoxib] Other (See Comments)    "felt funny"  . Demerol Palpitations and Other (See Comments)    Increased BP  . Morphine And Related Nausea And Vomiting  . Ciprofloxacin Other (See Comments)    arthralgia  . Metformin Nausea And Vomiting  . Zocor [Simvastatin] Other (See Comments)    Makes pt very drowsy    Social History:  The patient  reports that he quit smoking about 18 years ago. He has never used smokeless tobacco. He reports that he does not drink alcohol or use illicit drugs.   ROS:  Please see the history of present illness.   Losing weight. No abdominal discomfort.   All other systems reviewed and negative.   OBJECTIVE: VS:  BP 130/50  Pulse 66  Ht 5\' 6"  (1.676 m)  Wt 244 lb (110.678 kg)  BMI 39.40 kg/m2 Well nourished, well developed, in no acute distress, obese and healthy-appearing HEENT: normal Neck: JVD flat. Carotid bruit no bruits  Cardiac:  normal S1, S2; RRR; no murmur Lungs:  clear to auscultation bilaterally, no wheezing, rhonchi or rales Abd: soft,  but with mild right upper quadrant tenderness, no hepatomegaly. Ext: Edema absent. Pulses no pulses Skin: warm and dry Neuro:  CNs 2-12 intact, no focal abnormalities noted  EKG:  Not repeated     NUCLEAR PERFUSION STUDY: February 2015 IMPRESSION: Low risk myovue Small inferolateral wall infarct from apex to base no ischemia EF 64%   Signed, Illene Labrador III, MD 08/09/2013 1:28 PM

## 2013-08-24 ENCOUNTER — Ambulatory Visit (INDEPENDENT_AMBULATORY_CARE_PROVIDER_SITE_OTHER): Payer: Medicare Other | Admitting: Internal Medicine

## 2013-08-24 ENCOUNTER — Encounter: Payer: Self-pay | Admitting: Internal Medicine

## 2013-08-24 VITALS — BP 152/60 | HR 60 | Ht 65.5 in | Wt 246.5 lb

## 2013-08-24 DIAGNOSIS — G8929 Other chronic pain: Secondary | ICD-10-CM | POA: Insufficient documentation

## 2013-08-24 DIAGNOSIS — I2510 Atherosclerotic heart disease of native coronary artery without angina pectoris: Secondary | ICD-10-CM | POA: Diagnosis not present

## 2013-08-24 DIAGNOSIS — R109 Unspecified abdominal pain: Secondary | ICD-10-CM

## 2013-08-24 DIAGNOSIS — K589 Irritable bowel syndrome without diarrhea: Secondary | ICD-10-CM

## 2013-08-24 DIAGNOSIS — M479 Spondylosis, unspecified: Secondary | ICD-10-CM | POA: Diagnosis not present

## 2013-08-24 NOTE — Assessment & Plan Note (Signed)
Current problems with intermittent LQ pain mostly LLQ and tingling could be referred from spinal problems Await injection F/u prn

## 2013-08-24 NOTE — Patient Instructions (Signed)
Follow up with us as needed.   I appreciate the opportunity to care for you.  

## 2013-08-25 ENCOUNTER — Encounter: Payer: Self-pay | Admitting: Internal Medicine

## 2013-08-25 ENCOUNTER — Ambulatory Visit (INDEPENDENT_AMBULATORY_CARE_PROVIDER_SITE_OTHER): Payer: Medicare Other | Admitting: Internal Medicine

## 2013-08-25 VITALS — BP 126/58 | HR 59 | Temp 97.4°F | Resp 12 | Ht 66.0 in | Wt 244.0 lb

## 2013-08-25 DIAGNOSIS — E119 Type 2 diabetes mellitus without complications: Secondary | ICD-10-CM

## 2013-08-25 DIAGNOSIS — I2510 Atherosclerotic heart disease of native coronary artery without angina pectoris: Secondary | ICD-10-CM

## 2013-08-25 NOTE — Progress Notes (Signed)
Patient ID: Eric [name], male   DOB: 07-15-1926, 80 y.o.   MRN: [id number]  HPI: Eric [name] is a 80 y.o.-year-old male, referred by his PCP, Dr. Asa Lente, for management of DM2, non-insulin-dependent, controlled, with complications (PVD, carotid ds., ICMP). He was seen by Dr Buddy Duty in the past. However, he brings an Alendronate label >> tells me he cannot take it. ? H/o osteoporosis  - I do not have the records.   Patient has been diagnosed with diabetes in 2005; he has not been on insulin before. Last hemoglobin A1c was: Lab Results  Component Value Date   HGBA1C 6.9* 07/26/2013   HGBA1C 6.9* 09/30/2012   HGBA1C 7.2* 05/30/2012   Pt is on a regimen of: - was on Actos ~ 10 years ago >> takes off 2/2 possible SEs - was on Metformin >> severe reflux  Pt checks his sugars 2x a day and they are: - am: 150-170 - 2h after b'fast: 110-125 - before lunch: n/c - 2h after lunch: n/c - before dinner: n/c - 2h after dinner: n/c - bedtime: n/c - nighttime: n/c No lows. Lowest sugar was 100; ? has hypoglycemia awareness. Highest sugar was 170  Pt's meals are: - Breakfast: egg salad - Lunch: soup - Dinner: soup - Snacks: veggies He walks 2-3 x a week.  - no CKD, last BUN/creatinine:  Lab Results  Component Value Date   BUN 17 07/17/2013   CREATININE 0.88 07/18/2013  On Valsartan. - last set of lipids: Lab Results  Component Value Date   CHOL 110 04/12/2013   HDL 33* 04/12/2013   LDLCALC 53 04/12/2013   TRIG 120 04/12/2013   CHOLHDL 4 05/30/2012  On Pravastatin. - last eye exam was in 03/2013. No DR.  - no numbness and tingling in his feet.  Pt has FH of DM in all 4 daughters.   ROS: Constitutional: no weight gain/loss, + fatigue, + subjective hypothermia Eyes: no blurry vision, no xerophthalmia ENT: no sore throat, no nodules palpated in throat, no dysphagia/odynophagia, + hoarseness, + decreased hearing,  Cardiovascular: + CP/+SOB/no palpitations/+ leg  swelling Respiratory: no cough/SOB Gastrointestinal: + N/no V/D/C/+ heartburn Musculoskeletal: no muscle/joint aches Skin: no rashes, + itching Neurological: no tremors/numbness/tingling/dizziness Psychiatric: no depression/anxiety  Past Medical History  Diagnosis Date  . LACTOSE INTOLERANCE   . OBESITY   . CARDIOMYOPATHY, ISCHEMIC   . AORTIC SCLEROSIS   . SICK SINUS/ TACHY-BRADY SYNDROME 09/2007    s/p PPM st judes  . PERIPHERAL VASCULAR DISEASE   . CAROTID BRUIT, RIGHT 02/27/2008  . IBS (irritable bowel syndrome)   . ALLERGIC RHINITIS   . ANEMIA-NOS   . OA (osteoarthritis)   . COPD   . GERD   . HYPERLIPIDEMIA   . HIATAL HERNIA   . Diverticulosis   . Prostate cancer     seed implants 2004  . DIABETES MELLITUS-TYPE II     diet controlled  . CORONARY ARTERY DISEASE     CABG 1995, PTCA/DES 2008, 2009 and 08/2010  . HYPERTENSION   . Asthma   . Sleep apnea   . Partial small bowel obstruction   . Primary hyperparathyroidism     Lab Results Component Value Date  PTH 150.7* 02/13/2013  CALCIUM 11.0* 02/13/2013  CAION 1.21 03/15/2008     Past Surgical History  Procedure Laterality Date  . Ptca  2008, 2009, 2012    with DES  . Partial small bowel obstruction  2009  . Bilateral cataracts    .  Pacemaker insertion      DDD/[employer]         Last interrogation 2/13  on chart     Pacemaker guideline order Dr Tamala Julian on chart  . Inguinal hernia repair Bilateral   . Lumbar disc surgery  12/2008  . Coronary artery bypass graft    . Total hip arthroplasty  08/21/2011    Procedure: TOTAL HIP ARTHROPLASTY;  Surgeon: Johnn Hai, MD;  Location: [facility];  Service: Orthopedics;  Laterality: Right;  . Colonoscopy    . Esophagogastroduodenoscopy    . Penile prosthesis placement     History   Social History  . Marital Status: Widowed    Spouse Name: N/A    Number of Children: 4   Occupational History  . retired    Social History Main Topics  . Smoking status: Former Smoker     Quit date: 06/09/1995  . Smokeless tobacco: Never Used     Comment: Retired, lives alone, but has a lady friend  . Alcohol Use: No  . Drug Use: No   Current Outpatient Prescriptions on File Prior to Visit  Medication Sig Dispense Refill  . albuterol (PROVENTIL HFA) 108 (90 BASE) MCG/ACT inhaler Inhale 2 puffs into the lungs every 4 (four) hours as needed. Wheezing       . albuterol (PROVENTIL) (2.5 MG/3ML) 0.083% nebulizer solution Take 3 mLs (2.5 mg total) by nebulization every 4 (four) hours as needed (Dx: COPD 496). Wheezing  75 mL  5  . albuterol-ipratropium (COMBIVENT) 18-103 MCG/ACT inhaler Inhale 2 puffs into the lungs every 6 (six) hours as needed. Wheezing       . aluminum & magnesium hydroxide (MAALOX) 225-200 MG/5ML suspension Take 15 mLs by mouth every 6 (six) hours as needed. Heart burn       . aspirin 325 MG EC tablet Take 325 mg by mouth daily.      . bumetanide (BUMEX) 1 MG tablet Take 1 mg by mouth daily after breakfast.       . clopidogrel (PLAVIX) 75 MG tablet Take 75 mg by mouth daily after breakfast.       . esomeprazole (NEXIUM) 40 MG capsule Take 40 mg by mouth 2 (two) times daily.      . fluticasone (FLONASE) 50 MCG/ACT nasal spray Place 2 sprays into the nose daily. Allergies  16 g  1  . HYDROcodone-acetaminophen (NORCO) 7.5-325 MG per tablet Take 1-2 tablets by mouth every 4 (four) hours as needed.      . isosorbide mononitrate (IMDUR) 60 MG 24 hr tablet Take 1 tablet (60 mg total) by mouth every evening.      . Niacin CR 1000 MG TBCR Take 1,000 mg by mouth at bedtime.       . nitroGLYCERIN (NITROSTAT) 0.4 MG SL tablet Place 0.4 mg under the tongue every 5 (five) minutes as needed. Chest pain       . Polyethyl Glycol-Propyl Glycol (SYSTANE) 0.4-0.3 % SOLN Apply 2 drops to eye at bedtime.      . polyethylene glycol (MIRALAX / GLYCOLAX) packet Take 17 g by mouth daily as needed. Constipation       . pravastatin (PRAVACHOL) 20 MG tablet Take 60 mg by mouth at  bedtime.       . senna (SENOKOT) 8.6 MG tablet Alternates with one tablet one day then two tablets the next day      . valsartan (DIOVAN) 160 MG tablet Take 1 tablet (160  mg total) by mouth 2 (two) times daily.       No current facility-administered medications on file prior to visit.   Allergies  Allergen Reactions  . Actos [Pioglitazone Hydrochloride] Other (See Comments)    "felt funny, drowsy, and weak":  . Celebrex [Celecoxib] Other (See Comments)    "felt funny"  . Demerol Palpitations and Other (See Comments)    Increased BP  . Morphine And Related Nausea And Vomiting  . Ciprofloxacin Other (See Comments)    arthralgia  . Metformin Nausea And Vomiting  . Zocor [Simvastatin] Other (See Comments)    Makes pt very drowsy   Family History  Problem Relation Age of Onset  . Colon cancer Neg Hx   . Hypertension Mother    PE: BP 126/58  Pulse 59  Temp(Src) 97.4 F (36.3 C) (Oral)  Resp 12  Ht 5\' 6"  (1.676 m)  Wt 244 lb (110.678 kg)  BMI 39.40 kg/m2  SpO2 94% Wt Readings from Last 3 Encounters:  08/25/13 244 lb (110.678 kg)  08/24/13 246 lb 8 oz (111.812 kg)  08/09/13 244 lb (110.678 kg)   Constitutional: overweight, in NAD Eyes: PERRLA, EOMI, no exophthalmos ENT: moist mucous membranes, no thyromegaly, no cervical lymphadenopathy Cardiovascular: RRR, No MRG Respiratory: CTA B Gastrointestinal: abdomen soft, NT, ND, BS+ Musculoskeletal: no deformities, strength intact in all 4 Skin: moist, warm, + multiple bruises on arms Neurological: no tremor with outstretched hands, DTR normal in all 4  ASSESSMENT: 1. DM2, non-insulin-dependent, uncontrolled, with complications - PVD - carotid ds. - ICMP  PLAN:  1. Patient with well controlled diabetes, not on antidiabetic regimen, with Hba1c at goal. - We discussed about options for treatment, and I suggested to:  Patient Instructions  Please continue to check sugars 1-2x a day, rotating checks. No need for diabetes  medications for now. - advised him to start checking sugars at different times of the day - check 1-2 times a day, rotating checks - given sugar log and advised how to fill it and to bring it at next appt  - given foot care handout and explained the principles  - given instructions for hypoglycemia management "15-15 rule"  - up to date yearly eye exams - he would like to limit the nr of doctors he goes to and would like to follow with Dr Asa Lente for DM >> I advised him to return to me as needed in the future - I will check with Dr Asa Lente if there is another issue that I needed to address with him (no recent records from Dr Buddy Duty available to see if he has osteoporosis or the Alendronate was started to control calcium...).  - Return to clinic in 1 mo with sugar log

## 2013-08-25 NOTE — Patient Instructions (Signed)
Please continue to check sugars 1-2x a day, rotating checks. No need for diabetes medications for now.  PATIENT INSTRUCTIONS FOR TYPE 2 DIABETES:  **Please join MyChart!** - see attached instructions about how to join   DIET AND EXERCISE Diet and exercise is an important part of diabetic treatment.  We recommended aerobic exercise in the form of brisk walking (working between 40-60% of maximal aerobic capacity, similar to brisk walking) for 150 minutes per week (such as 30 minutes five days per week) along with 3 times per week performing 'resistance' training (using various gauge rubber tubes with handles) 5-10 exercises involving the major muscle groups (upper body, lower body and core) performing 10-15 repetitions (or near fatigue) each exercise. Start at half the above goal but build slowly to reach the above goals. If limited by weight, joint pain, or disability, we recommend daily walking in a swimming pool with water up to waist to reduce pressure from joints while allow for adequate exercise.    BLOOD GLUCOSES Monitoring your blood glucoses is important for continued management of your diabetes. Please check your blood glucoses 2-4 times a day: fasting, before meals and at bedtime (you can rotate these measurements - e.g. one day check before the 3 meals, the next day check before 2 of the meals and before bedtime, etc.   HYPOGLYCEMIA (low blood sugar) Hypoglycemia is usually a reaction to not eating, exercising, or taking too much insulin/ other diabetes drugs.  Symptoms include tremors, sweating, hunger, confusion, headache, etc. Treat IMMEDIATELY with 15 grams of Carbs:   4 glucose tablets    cup regular juice/soda   2 tablespoons raisins   4 teaspoons sugar   1 tablespoon honey Recheck blood glucose in 15 mins and repeat above if still symptomatic/blood glucose <100. Please contact our office at [phone] if you have questions about how to next handle your  insulin.  RECOMMENDATIONS TO REDUCE YOUR RISK OF DIABETIC COMPLICATIONS: * Take your prescribed MEDICATION(S). * Follow a DIABETIC diet: Complex carbs, fiber rich foods, heart healthy fish twice weekly, (monounsaturated and polyunsaturated) fats * AVOID saturated/trans fats, high fat foods, >2,300 mg salt per day. * EXERCISE at least 5 times a week for 30 minutes or preferably daily.  * DO NOT SMOKE OR DRINK more than 1 drink a day. * Check your FEET every day. Do not wear tightfitting shoes. Contact us if you develop an ulcer * See your EYE doctor once a year or more if needed * Get a FLU shot once a year * Get a PNEUMONIA vaccine once before and once after age 17 years  GOALS:  * Your Hemoglobin A1c of <7%  * fasting sugars need to be <130 * after meals sugars need to be <180 (2h after you start eating) * Your Systolic BP should be 778 or lower  * Your Diastolic BP should be 80 or lower  * Your HDL (Good Cholesterol) should be 40 or higher  * Your LDL (Bad Cholesterol) should be 100 or lower  * Your Triglycerides should be 150 or lower  * Your Urine microalbumin (kidney function) should be <30 * Your Body Mass Index should be 25 or lower   We will be glad to help you achieve these goals. Our telephone number is: [phone].

## 2013-08-26 ENCOUNTER — Encounter: Payer: Self-pay | Admitting: Internal Medicine

## 2013-08-26 NOTE — Progress Notes (Signed)
         Subjective:    Patient ID: COURTNEY FENLON, male    DOB: Nov 04, 1926, 80 y.o.   MRN: [id number]  HPI The patient says that Gaviscon is helping his intermittent sour taste in mouth and regurgitation. He continues to have intermittent LLQ pain that responds to narcotics. He continues to have back pain - is going to have an epidural steroid injection soon.  Medications, allergies, past medical history, past surgical history, family history and social history are reviewed and updated in the EMR.  Review of Systems As above    Objective:   Physical Exam Elderly, obese, NAD       Assessment & Plan:  Chronic abdominal pain Current problems with intermittent LQ pain mostly LLQ and tingling could be referred from spinal problems Await injection F/u prn  Irritable bowel syndrome His LLQ pain may be from this. It is chronic and unchanged and no further w/u necessary

## 2013-08-26 NOTE — Assessment & Plan Note (Signed)
His LLQ pain may be from this. It is chronic and unchanged and no further w/u necessary

## 2013-08-30 DIAGNOSIS — M545 Low back pain, unspecified: Secondary | ICD-10-CM | POA: Diagnosis not present

## 2013-09-12 ENCOUNTER — Telehealth: Payer: Self-pay | Admitting: Internal Medicine

## 2013-09-12 DIAGNOSIS — M545 Low back pain, unspecified: Secondary | ICD-10-CM | POA: Diagnosis not present

## 2013-09-12 NOTE — Telephone Encounter (Signed)
He does not have any evidence of colitis. Tell him we can schedule a flex sig to double check - ok to stay on Plavix for that Other than that I do not have any new suggestions

## 2013-09-12 NOTE — Telephone Encounter (Signed)
Patient wanted you to know that he had a spinal injection on 08/30/13 with no improvement.  He reports that he believes he has colitis after looking on the internet.  He denies rectal bleeding or diarrhea.  He wants to be treated for colitis.  i have tried to explain to him that he has IBS, but he does not think that is what he has.  He is aware you will not return to the office until 4/13.  Please advise

## 2013-09-13 DIAGNOSIS — M48061 Spinal stenosis, lumbar region without neurogenic claudication: Secondary | ICD-10-CM | POA: Diagnosis not present

## 2013-09-13 DIAGNOSIS — M5137 Other intervertebral disc degeneration, lumbosacral region: Secondary | ICD-10-CM | POA: Diagnosis not present

## 2013-09-13 NOTE — Telephone Encounter (Signed)
Patient is scheduled for flex for 09/22/13 and pre-visit 09/15/13

## 2013-09-14 ENCOUNTER — Ambulatory Visit (INDEPENDENT_AMBULATORY_CARE_PROVIDER_SITE_OTHER): Payer: Medicare Other | Admitting: Internal Medicine

## 2013-09-14 ENCOUNTER — Encounter: Payer: Self-pay | Admitting: Internal Medicine

## 2013-09-14 VITALS — BP 124/60 | HR 67

## 2013-09-14 DIAGNOSIS — J449 Chronic obstructive pulmonary disease, unspecified: Secondary | ICD-10-CM | POA: Diagnosis not present

## 2013-09-14 DIAGNOSIS — E669 Obesity, unspecified: Secondary | ICD-10-CM

## 2013-09-14 DIAGNOSIS — I2510 Atherosclerotic heart disease of native coronary artery without angina pectoris: Secondary | ICD-10-CM

## 2013-09-14 DIAGNOSIS — G4733 Obstructive sleep apnea (adult) (pediatric): Secondary | ICD-10-CM | POA: Diagnosis not present

## 2013-09-14 NOTE — Patient Instructions (Signed)
Recommend you run O2 at night at 3L to compensate for length of hose.  We can continue CPAP 12/ Advanced, with O2 3L  Ok to use O2 2L during the day time.

## 2013-09-14 NOTE — Progress Notes (Signed)
04/10/11- 80 year old male former smoker, Veteran, who had been a patient of mine in the old [employer]. He is coming to reestablish after moving back from [place] to [place]. History of asthma, COPD, obstructive sleep apnea complicated by DM, obesity, CAD/ischemic CM/sick sinus/bradycardia tachycardia with pacemaker, history of prostate cancer, anemia. He reports feeling stable today. New carpet smell in his apartment is irritating to his breathing but he denies routine cough or recent bronchitis. He has not felt the need to use his bronchodilators in 4 months. Walking distances limited more by degenerative joint hip pain. He can walk a maximum of 10 minutes on level ground and very little on hills or stairs. He denies blood, adenopathy, discolored sputum or swelling. He usually has trace ankle edema with no history of venous thromboembolic disease. CAD treated with CABG, stent and pacemaker but he has not had infarction. Did have pneumonia at age 62. He declines flu vaccine. Has had pneumonia vaccine. Long-term diagnosis of sleep apnea. NPSG 01/27/06-AHI 59.9 per hour. CPAP 12 plus oxygen 2 L for sleep, with good compliance and control. He will want to change to a local provider.  07/14/11- 80 year old male former smoker, Veteran,  History of asthma, COPD, obstructive sleep apnea complicated by DM, obesity, CAD/ischemic CM/sick sinus/bradycardia tachycardia with pacemaker, history of prostate cancer, anemia He had his first flu vaccine in 10 years this winter and avoided any significant respiratory infection. He no longer thinks he needs daytime oxygen. He is wearing it only at night, with his CPAP. Scheduled for right hip replacement in March. Chest x-ray 04/15/2011-no acute process, pacemaker PFT 04/23/2011-mild COPD  08/14/11-  80 year old male former smoker, Veteran,  History of asthma, COPD, obstructive sleep apnea complicated by DM, obesity, CAD/ischemic CM/sick sinus/bradycardia tachycardia/ pacemaker,  history of prostate cancer, anemia He is pending hip replacement by Dr. Tonita Cong on March 15. Asks prior authorization. I explained his increased risk of cardiopulmonary complications from any surgery and recognition that he is probably stable and near his best function now for necessary surgery. He is trying to move from his current apartment to a smoke free facility. A neighbor smokes heavily and the tobacco odor comes into the air conditioning ducts. He continues good compliance and symptom control using CPAP 12 with O2 2 L every night. He will use those settings while he is at the hospital for sleep and in recovery after extubation.  10/26/11- 80 year old male former smoker, Veteran,  History of asthma, COPD, obstructive sleep apnea complicated by DM, obesity, CAD/ischemic CM/sick sinus/bradycardia tachycardia/ pacemaker, history of prostate cancer, anemia Pt states having a productive cough  yellowish brown ,,increase sob wheezing ,chest congestion, cold chills .  Had right total hip replacement and finished physical therapy. Pain still limits walking. No respiratory complications. Now for 3 days had increased cough, yellow sputum, shortness of breath, wheeze, cold chills. Continued reflux symptoms on Nexium once daily. Discussion of steroid therapy and side effects.  11/05/11- 80 year old male former smoker, Veteran,  History of asthma, COPD, obstructive sleep apnea complicated by DM, obesity, CAD/ischemic CM/sick sinus/bradycardia tachycardia/ pacemaker, history of prostate cancer, anemia ACUTE VISIT: SOB and cough-productive yellow in color.Would like Zpak. Seen 10 days ago and Depo-Medrol and Z-Pak did help at that visit. Comfortable with CPAP. He takes it off to cough.  01/04/12- 80 year old male former smoker, Veteran,  History of asthma, COPD, obstructive sleep apnea complicated by DM, obesity, CAD/ischemic CM/sick sinus/bradycardia tachycardia/ pacemaker, history of prostate cancer,  anemia. Needs recert for O2 at night per  [facility]; pt states he feels like he needs O2 during the day as well. He says he sleeps and feels better when  using oxygen. Continue CPAP 12/Advanced, with oxygen at 2 L currently. COPD assessment test (CAT) 31/40.  03/07/12- 80 year old male former smoker, Veteran,  History of asthma, COPD, obstructive sleep apnea complicated by DM, obesity, CAD/ischemic CM/sick sinus/bradycardia tachycardia/ pacemaker, history of prostate cancer, anemia. Has good and bad days; able to move through the home with O2 (3L) on. Has had flu vaccine He qualified for home oxygen and uses it 90% of the time. Needs to wear it to clean his apartment. CPAP 12 "can't sleep without it". Walks laps in parking lot for exercise.  09/13/12- 21-year-old male former smoker, Veteran,  History of asthma, COPD, obstructive sleep apnea complicated by DM, obesity, CAD/ischemic CM/sick sinus/bradycardia tachycardia/ pacemaker, history of prostate cancer, anemia. Follows For:  Requalify for O2 for [facility] -  Pt doesnt feel like he needs O2 - SOB with increased housework activity - PT had fall 2 weeks ago and hurt back - Scheduled for CT this week Isn't sure he needs portable oxygen but does have concentrator in his house for sleep and as needed. He can't sleep without CPAP 12/ APS all night, every night, O2 2L/ Advanced. CXR 11/05/11 IMPRESSION:  Bibasilar bronchiectasis with new associated nodular air space  disease, indicative of an infectious bronchiolitis or  bronchopneumonia.  Original Report Authenticated By: Luretha Rued, M.D.  03/16/13-  80 year old male former smoker, Veteran,  History of asthma, COPD, obstructive sleep apnea complicated by DM, obesity, CAD/ischemic CM/sick sinus/bradycardia tachycardia/ pacemaker, history of prostate cancer, anemia. CPAP 12 / O2 2L/ APS sleep. Would like not to need O2 for exertion.  needs o2 recertified.  Breathing is unchanged.  Wearing cpap every night.  No  problems with mask or pressure.   Dizzy if he stands quickly and questions blood pressure medicines. Sees Dr Asa Lente tomorrow. CXR 02/02/13 IMPRESSION:  No acute abnormalities.  Original Report Authenticated By: Lorriane Shire, M.D. Exercise oximetry 03/16/13- rest RA 93%, walking RA 87%, Walking 2L O2 98%.   09/14/13-  80 year old male former smoker, Veteran,  History of asthma, COPD, obstructive sleep apnea complicated by DM, obesity, CAD/ischemic CM/sick sinus/bradycardia tachycardia/ pacemaker, history of prostate cancer, anemia. CPAP 12 / O2 2L/ Advanced sleep. FOLLOWS FOR: back pain continues to make it hard to breath. Otherwise continues to wear O2 during day on pulsing and O2 concentrator at night. Desat on room air at rest today to 88%. CXR 07/17/13 IMPRESSION:  Sequential pacemaker in place. Post CABG. Cardiomegaly.  Calcified tortuous aorta.  Mild central pulmonary vascular prominence stable.  Basilar scarring/subsegmental atelectasis without segmental  consolidation.  Electronically Signed  By: Chauncey Cruel M.D.  On: 07/17/2013 16:32  ROS-see HPI Constitutional:   No-   weight loss, night sweats, fevers, +chills, fatigue, lassitude. HEENT:   No-  headaches, difficulty swallowing, tooth/dental problems, sore throat,       No-  sneezing, itching, ear ache, nasal congestion, post nasal drip,  CV:  No- recent  chest pain, orthopnea, PND. +Mild chronic swelling in lower extremities, No- anasarca,                         dizziness, palpitations Resp: +  shortness of breath with exertion or at rest.              No- productive cough,  + non-productive cough,  No- coughing up  of blood.              No- change in color of mucus.  No- wheezing.   Skin: No-   rash or lesions. GI:  No-   heartburn, indigestion, abdominal pain, nausea, vomiting,  GU:  MS: . Limiting hip pain Neuro-     nothing unusual Psych:  No- change in mood or affect. No depression or anxiety.  No memory  loss.  OBJ BP 124/60  Pulse 67  SpO2 96% General- Alert, Oriented, Affect-appropriate, Distress- none acute, overweight, O2 2 L Skin- rash-none, lesions- none, excoriation- none Lymphadenopathy- none Head- atraumatic            Eyes- Gross vision intact, PERRLA, conjunctivae clear secretions            Ears- Hearing aids, HOH            Nose- Clear, no-Septal dev, mucus, polyps, erosion, perforation             Throat- Mallampati II , mucosa clear , drainage- none, tonsils- atrophic Neck- flexible , trachea midline, no stridor , thyroid nl, carotid no bruit Chest - symmetrical excursion , unlabored           Heart/CV- RRR +bigeminal pulse , no murmur , no gallop  , no rub, nl s1 s2                           - JVD- none , edema- none, stasis changes- none, varices- none           Lung- +few crackles, unlabored, wheeze- none, cough-none,   dullness-none, rub- none.                     Raspy laugh.           Chest wall- pacemaker left chest Abd- Br/ Gen/ Rectal- Not done, not indicated Extrem- cyanosis- none, clubbing, none, atrophy- none, strength- deconditioned, +cane Neuro- grossly intact to observation

## 2013-09-15 ENCOUNTER — Ambulatory Visit (AMBULATORY_SURGERY_CENTER): Payer: Self-pay | Admitting: *Deleted

## 2013-09-15 VITALS — Ht 66.0 in | Wt 242.6 lb

## 2013-09-15 DIAGNOSIS — R109 Unspecified abdominal pain: Secondary | ICD-10-CM

## 2013-09-15 NOTE — Progress Notes (Addendum)
No egg or soy allergy. No anesthesia problems.  Pt was using walker and was unsteady on feet, pt stumbled when standing on scale and getting off, pt was not wearing oxygen during visit but is suppose to be wearing oxygen during the day, pt seemed very out of breath, pt was also in pain, pt c/o pain on the L side of his abd, pt was grimacing and would moan when moving, pt seemed very distracted by pain, pt had a hard time understanding instructions, when advised that he may need to hospital for pain, pt states it has been this way for a while now and he has seen his Drs. Pt was very slow to stand after appointment and grimaced with movement.-adm

## 2013-09-19 ENCOUNTER — Telehealth: Payer: Self-pay

## 2013-09-19 DIAGNOSIS — R109 Unspecified abdominal pain: Secondary | ICD-10-CM

## 2013-09-19 NOTE — Telephone Encounter (Signed)
Message copied by Marlon Pel on Tue Sep 19, 2013 11:07 AM ------      Message from: Silvano Rusk E      Created: Mon Sep 18, 2013  7:42 AM       i am willing to do it midday 4/17 if they can do it at the hospital      ----- Message -----         From: Kellie Moor, RN         Sent: 09/15/2013   1:27 PM           To: Gatha Mayer, MD            Dr. Carlean Purl he is scheduled in the [facility] for lex on 4/17.  Apparently he is on O2.  Are you doing this unsedated? Does he need to be moved to the hospital?            [facility]       ------

## 2013-09-19 NOTE — Telephone Encounter (Signed)
Patient is rescheduled to [facility] 09/22/13 11:00.  He is aware and agrees to the change

## 2013-09-21 ENCOUNTER — Telehealth: Payer: Self-pay | Admitting: Internal Medicine

## 2013-09-21 NOTE — Telephone Encounter (Signed)
Patient advised he should bring his machine with him to the hospital.

## 2013-09-22 ENCOUNTER — Other Ambulatory Visit: Payer: Medicare Other | Admitting: Internal Medicine

## 2013-09-22 ENCOUNTER — Encounter (HOSPITAL_COMMUNITY)
Admission: RE | Disposition: A | Discharge status: Home / Self Care | Payer: Self-pay | Source: Ambulatory Visit | Attending: Internal Medicine

## 2013-09-22 ENCOUNTER — Encounter (HOSPITAL_COMMUNITY): Payer: Medicare Other

## 2013-09-22 ENCOUNTER — Ambulatory Visit (HOSPITAL_COMMUNITY)
Admission: RE | Admit: 2013-09-22 | Discharge: 2013-09-22 | Disposition: A | Discharge status: Home / Self Care | Payer: Medicare Other | Source: Ambulatory Visit | Attending: Internal Medicine | Admitting: Internal Medicine

## 2013-09-22 ENCOUNTER — Encounter (HOSPITAL_COMMUNITY): Payer: Self-pay | Admitting: *Deleted

## 2013-09-22 ENCOUNTER — Ambulatory Visit (HOSPITAL_COMMUNITY): Admit: 2013-09-22 | Payer: Self-pay | Admitting: Internal Medicine

## 2013-09-22 DIAGNOSIS — Z951 Presence of aortocoronary bypass graft: Secondary | ICD-10-CM | POA: Insufficient documentation

## 2013-09-22 DIAGNOSIS — Z87891 Personal history of nicotine dependence: Secondary | ICD-10-CM | POA: Insufficient documentation

## 2013-09-22 DIAGNOSIS — Z96649 Presence of unspecified artificial hip joint: Secondary | ICD-10-CM | POA: Diagnosis not present

## 2013-09-22 DIAGNOSIS — K219 Gastro-esophageal reflux disease without esophagitis: Secondary | ICD-10-CM | POA: Diagnosis not present

## 2013-09-22 DIAGNOSIS — R1032 Left lower quadrant pain: Secondary | ICD-10-CM | POA: Diagnosis not present

## 2013-09-22 DIAGNOSIS — E119 Type 2 diabetes mellitus without complications: Secondary | ICD-10-CM | POA: Insufficient documentation

## 2013-09-22 DIAGNOSIS — I10 Essential (primary) hypertension: Secondary | ICD-10-CM | POA: Insufficient documentation

## 2013-09-22 DIAGNOSIS — I739 Peripheral vascular disease, unspecified: Secondary | ICD-10-CM | POA: Insufficient documentation

## 2013-09-22 DIAGNOSIS — J449 Chronic obstructive pulmonary disease, unspecified: Secondary | ICD-10-CM | POA: Insufficient documentation

## 2013-09-22 DIAGNOSIS — E785 Hyperlipidemia, unspecified: Secondary | ICD-10-CM | POA: Insufficient documentation

## 2013-09-22 DIAGNOSIS — G4730 Sleep apnea, unspecified: Secondary | ICD-10-CM | POA: Diagnosis not present

## 2013-09-22 DIAGNOSIS — K5730 Diverticulosis of large intestine without perforation or abscess without bleeding: Secondary | ICD-10-CM | POA: Diagnosis not present

## 2013-09-22 DIAGNOSIS — K589 Irritable bowel syndrome without diarrhea: Secondary | ICD-10-CM | POA: Diagnosis not present

## 2013-09-22 DIAGNOSIS — Z79899 Other long term (current) drug therapy: Secondary | ICD-10-CM | POA: Diagnosis not present

## 2013-09-22 DIAGNOSIS — I428 Other cardiomyopathies: Secondary | ICD-10-CM | POA: Insufficient documentation

## 2013-09-22 DIAGNOSIS — I2510 Atherosclerotic heart disease of native coronary artery without angina pectoris: Secondary | ICD-10-CM | POA: Diagnosis not present

## 2013-09-22 DIAGNOSIS — E669 Obesity, unspecified: Secondary | ICD-10-CM | POA: Diagnosis not present

## 2013-09-22 DIAGNOSIS — Z8546 Personal history of malignant neoplasm of prostate: Secondary | ICD-10-CM | POA: Insufficient documentation

## 2013-09-22 DIAGNOSIS — R198 Other specified symptoms and signs involving the digestive system and abdomen: Secondary | ICD-10-CM | POA: Insufficient documentation

## 2013-09-22 DIAGNOSIS — Z950 Presence of cardiac pacemaker: Secondary | ICD-10-CM | POA: Insufficient documentation

## 2013-09-22 DIAGNOSIS — J4489 Other specified chronic obstructive pulmonary disease: Secondary | ICD-10-CM | POA: Insufficient documentation

## 2013-09-22 HISTORY — PX: FLEXIBLE SIGMOIDOSCOPY: SHX5431

## 2013-09-22 SURGERY — SIGMOIDOSCOPY, FLEXIBLE
Anesthesia: Moderate Sedation

## 2013-09-22 MED ORDER — SODIUM CHLORIDE 0.9 % IV SOLN: INTRAVENOUS | Status: DC

## 2013-09-22 MED ORDER — FENTANYL CITRATE 0.05 MG/ML IJ SOLN
INTRAMUSCULAR | Status: AC
  Filled 2013-09-22: qty 2

## 2013-09-22 MED ORDER — MIDAZOLAM HCL 10 MG/2ML IJ SOLN
INTRAMUSCULAR | Status: AC
  Filled 2013-09-22: qty 2

## 2013-09-22 NOTE — H&P (Signed)
[facility] History and Physical   Primary Care Physician:  [name], MD   Reason for Procedure:   evaluate LLQ pain  Plan:    Sigmoidoscopy, The risks and benefits as well as alternatives of endoscopic procedure(s) have been discussed and reviewed. All questions answered. The patient agrees to proceed.      HPI: Eric [name] is a 80 y.o. male with IBS and chronic recurrent abdominal pain and bowel changes. He has been having increasing difficulty with LLQ pain - unhelped by 1 epidural injections. There was a ? Of referred radicular pain.    Past Medical History  Diagnosis Date  . LACTOSE INTOLERANCE   . OBESITY   . CARDIOMYOPATHY, ISCHEMIC   . AORTIC SCLEROSIS   . SICK SINUS/ TACHY-BRADY SYNDROME 09/2007    s/p PPM st judes  . PERIPHERAL VASCULAR DISEASE   . CAROTID BRUIT, RIGHT 02/27/2008  . IBS (irritable bowel syndrome)   . ALLERGIC RHINITIS   . ANEMIA-NOS   . OA (osteoarthritis)   . COPD   . GERD   . HYPERLIPIDEMIA   . HIATAL HERNIA   . Diverticulosis   . Prostate cancer     seed implants 2004  . DIABETES MELLITUS-TYPE II     diet controlled  . CORONARY ARTERY DISEASE     CABG 1995, PTCA/DES 2008, 2009 and 08/2010  . HYPERTENSION   . Asthma   . Sleep apnea   . Partial small bowel obstruction   . Primary hyperparathyroidism     Lab Results Component Value Date  PTH 150.7* 02/13/2013  CALCIUM 11.0* 02/13/2013  CAION 1.21 03/15/2008    . SMALL BOWEL OBSTRUCTION 04/18/2009    Qualifier: History of  By: Asa Lente MD, Jannifer Rodney Cataract     surgery    Past Surgical History  Procedure Laterality Date  . Ptca  2008, 2009, 2012    with DES  . Partial small bowel obstruction  2009  . Bilateral cataracts    . Pacemaker insertion      DDD/[employer]         Last interrogation 2/13  on chart     Pacemaker guideline order Dr Tamala Julian on chart  . Inguinal hernia repair Bilateral   . Lumbar disc surgery  12/2008  . Coronary artery  bypass graft    . Total hip arthroplasty  08/21/2011    Procedure: TOTAL HIP ARTHROPLASTY;  Surgeon: Johnn Hai, MD;  Location: [facility];  Service: Orthopedics;  Laterality: Right;  . Colonoscopy    . Esophagogastroduodenoscopy    . Penile prosthesis placement    . Back surgery    . Colon surgery      Prior to Admission medications   Medication Sig Start Date End Date Taking? Authorizing Provider  albuterol (PROVENTIL HFA) 108 (90 BASE) MCG/ACT inhaler Inhale 2 puffs into the lungs every 4 (four) hours as needed. Wheezing    Yes Historical Provider, MD  albuterol-ipratropium (COMBIVENT) 18-103 MCG/ACT inhaler Inhale 2 puffs into the lungs every 6 (six) hours as needed. Wheezing    Yes Historical Provider, MD  aluminum & magnesium hydroxide (MAALOX) 225-200 MG/5ML suspension Take 15 mLs by mouth every 6 (six) hours as needed. Heart burn    Yes Historical Provider, MD  aspirin 325 MG EC tablet Take 325 mg by mouth daily.   Yes Historical Provider, MD  bumetanide (BUMEX) 1 MG tablet Take 1 mg by  mouth daily after breakfast.    Yes Historical Provider, MD  clopidogrel (PLAVIX) 75 MG tablet Take 75 mg by mouth daily after breakfast.    Yes Historical Provider, MD  esomeprazole (NEXIUM) 40 MG capsule Take 40 mg by mouth 2 (two) times daily. 07/17/13  Yes Gatha Mayer, MD  fluticasone (FLONASE) 50 MCG/ACT nasal spray Place 2 sprays into the nose daily. Allergies 11/09/12  Yes Rowe Clack, MD  HYDROcodone-acetaminophen (NORCO) 7.5-325 MG per tablet Take 1-2 tablets by mouth every 4 (four) hours as needed. 08/24/11  Yes Rometta Emery, PA-C  isosorbide mononitrate (IMDUR) 60 MG 24 hr tablet Take 1 tablet (60 mg total) by mouth every evening. 03/24/13  Yes Rowe Clack, MD  Niacin CR 1000 MG TBCR Take 1,000 mg by mouth at bedtime.    Yes Historical Provider, MD  Polyethyl Glycol-Propyl Glycol (SYSTANE) 0.4-0.3 % SOLN Apply 2 drops to eye at bedtime.   Yes Historical Provider, MD    polyethylene glycol (MIRALAX / GLYCOLAX) packet Take 17 g by mouth daily as needed. Constipation    Yes Historical Provider, MD  pravastatin (PRAVACHOL) 20 MG tablet Take 60 mg by mouth at bedtime.    Yes Historical Provider, MD  senna (SENOKOT) 8.6 MG tablet Alternates with one tablet one day then two tablets the next day   Yes Historical Provider, MD  valsartan (DIOVAN) 160 MG tablet Take 1 tablet (160 mg total) by mouth 2 (two) times daily. 04/11/13  Yes Rowe Clack, MD  albuterol (PROVENTIL) (2.5 MG/3ML) 0.083% nebulizer solution Take 3 mLs (2.5 mg total) by nebulization every 4 (four) hours as needed (Dx: COPD 496). Wheezing 04/07/13   Deneise Lever, MD  nitroGLYCERIN (NITROSTAT) 0.4 MG SL tablet Place 0.4 mg under the tongue every 5 (five) minutes as needed. Chest pain     Historical Provider, MD    Current Facility-Administered Medications  Medication Dose Route Frequency Provider Last Rate Last Dose  . 0.9 %  sodium chloride infusion   Intravenous Continuous Gatha Mayer, MD        Allergies as of 09/19/2013 - Review Complete 09/15/2013  Allergen Reaction Noted  . Actos [pioglitazone hydrochloride] Other (See Comments) 04/10/2011  . Celebrex [celecoxib] Other (See Comments) 04/10/2011  . Demerol Palpitations and Other (See Comments) 04/10/2011  . Morphine and related Nausea And Vomiting 04/10/2011  . Ciprofloxacin Other (See Comments) 10/29/2009  . Metformin Nausea And Vomiting 01/30/2010  . Zocor [simvastatin] Other (See Comments) 08/22/2011    Family History  Problem Relation Age of Onset  . Colon cancer Neg Hx   . Hypertension Mother     History   Social History  . Marital Status: Widowed    Spouse Name: N/A    Number of Children: N/A  . Years of Education: N/A   Occupational History  . retired    Social History Main Topics  . Smoking status: Former Smoker    Quit date: 06/09/1995  . Smokeless tobacco: Never Used     Comment: Retired, lives alone,  but has a lady friend  . Alcohol Use: No  . Drug Use: No      Review of Systems: Positive for back pain, home o2, CPAP All other review of systems negative except as mentioned in the HPI.  Physical Exam: Vital signs in last 24 hours: Temp:  [98.2 F (36.8 C)] 98.2 F (36.8 C) (04/17 1140) Resp:  [19] 19 (04/17 1140) BP: (147)/(73) 147/73 mmHg (04/17  1140) SpO2:  [91 %] 91 % (04/17 1140)   General:   Alert,  Well-developed, well-nourished, pleasant and cooperative in NAD- obese Lungs:  Clear throughout to auscultation.   Heart:  Regular rate and rhythm; no murmurs, clicks, rubs,  or gallops. Abdomen:  Obese, Soft, nontender and nondistended. Normal bowel sounds.   Neuro/Psych:  Alert and cooperative. Normal mood and affect. A and O x 3   @Lamel Mccarley  Simonne Maffucci, MD, [facility] [phone] (pager) 09/22/2013 12:06 PM@

## 2013-09-22 NOTE — Progress Notes (Signed)
No sedation used.

## 2013-09-22 NOTE — Discharge Instructions (Signed)
You have a condition called diverticulosis - common and not usually a problem. Please read the handout provided. This may cause some of your pain and it still could be related to your back.  I am sorry but I do not have any new recommendations for treatment - I suggest you continue with your current treatment and let's hope that the spinal injections make a difference.  Gatha Mayer, MD, [facility]    Diverticulosis Diverticulosis is a common condition that develops when small pouches (diverticula) form in the wall of the colon. The risk of diverticulosis increases with age. It happens more often in people who eat a low-fiber diet. Most individuals with diverticulosis have no symptoms. Those individuals with symptoms usually experience abdominal pain, constipation, or loose stools (diarrhea). HOME CARE INSTRUCTIONS   Increase the amount of fiber in your diet as directed by your caregiver or dietician. This may reduce symptoms of diverticulosis.  Your caregiver may recommend taking a dietary fiber supplement.  Drink at least 6 to 8 glasses of water each day to prevent constipation.  Try not to strain when you have a bowel movement.  Your caregiver may recommend avoiding nuts and seeds to prevent complications, although this is still an uncertain benefit.  Only take over-the-counter or prescription medicines for pain, discomfort, or fever as directed by your caregiver. FOODS WITH HIGH FIBER CONTENT INCLUDE:  Fruits. Apple, peach, pear, tangerine, raisins, prunes.  Vegetables. Brussels sprouts, asparagus, broccoli, cabbage, carrot, cauliflower, romaine lettuce, spinach, summer squash, tomato, winter squash, zucchini.  Starchy Vegetables. Baked beans, kidney beans, lima beans, split peas, lentils, potatoes (with skin).  Grains. Whole wheat bread, brown rice, bran flake cereal, plain oatmeal, white rice, shredded wheat, bran muffins. SEEK IMMEDIATE MEDICAL CARE IF:   You develop  increasing pain or severe bloating.  You have an oral temperature above 102 F (38.9 C), not controlled by medicine.  You develop vomiting or bowel movements that are bloody or black. Document Released: 02/20/2004 Document Revised: 08/17/2011 Document Reviewed: 10/23/2009 ExitCare Patient Information 2014 [employer].  YOU HAD AN ENDOSCOPIC PROCEDURE TODAY: Refer to the procedure report and other information in the discharge instructions given to you for any specific questions about what was found during the examination. If this information does not answer your questions, please call Dr. Celesta Aver office at [phone] to clarify.   YOU SHOULD EXPECT: Some feelings of bloating in the abdomen. Passage of more gas than usual. Walking can help get rid of the air that was put into your GI tract during the procedure and reduce the bloating. If you had a lower endoscopy (such as a colonoscopy or flexible sigmoidoscopy) you may notice spotting of blood in your stool or on the toilet paper. Some abdominal soreness may be present for a day or two, also.  DIET: Your first meal following the procedure should be a light meal and then it is ok to progress to your normal diet. A half-sandwich or bowl of soup is an example of a good first meal. Heavy or fried foods are harder to digest and may make you feel nauseous or bloated. Drink plenty of fluids but you should avoid alcoholic beverages for 24 hours.   ACTIVITY: Your care partner should take you home directly after the procedure. You should plan to take it easy, moving slowly for the rest of the day. You can resume normal activity the day after the procedure however YOU SHOULD NOT DRIVE, use power tools, machinery or perform tasks  that involve climbing or major physical exertion for 24 hours (because of the sedation medicines used during the test).   SYMPTOMS TO REPORT IMMEDIATELY: A gastroenterologist can be reached at any hour. Please call [phone]   for any of the following symptoms:  Following lower endoscopy (colonoscopy, flexible sigmoidoscopy) Excessive amounts of blood in the stool  Significant tenderness, worsening of abdominal pains  Swelling of the abdomen that is new, acute  Fever of 100 or higher    FOLLOW UP:  If any biopsies were taken you will be contacted by phone or by letter within the next 1-3 weeks. Call [phone]  if you have not heard about the biopsies in 3 weeks.  Please also call with any specific questions about appointments or follow up tests.

## 2013-09-22 NOTE — Op Note (Signed)
[facility] [place], [id number]   FLEXIBLE SIGMOIDOSCOPY PROCEDURE REPORT  PATIENT: Eric, [name]  MR#: [id number] BIRTHDATE: Oct 12, 1926 , 86  yrs. old GENDER: Male ENDOSCOPIST: Gatha Mayer, MD, [facility] PROCEDURE DATE:  09/22/2013 PROCEDURE:   Sigmoidoscopy, diagnostic ASA CLASS:   Class III INDICATIONS:abdominal pain in the lower left quadrant. MEDICATIONS: None  DESCRIPTION OF PROCEDURE:   After the risks benefits and alternatives of the procedure were thoroughly explained, informed consent was obtained.  revealed no abnormalities of the rectum. The endoscope was introduced through the anus  and advanced to the splenic flexure , limited by No adverse events experienced.   The quality of the prep was adequate .  The instrument was then slowly withdrawn as the mucosa was fully examined.       1.  COLON FINDINGS: Severe diverticulosis was noted in the sigmoid colon. 2.  The colon mucosa was otherwise normal. Retroflexed views revealed no abnormalities.    The scope was then withdrawn from the patient and the procedure terminated.  COMPLICATIONS: There were no complications.  ENDOSCOPIC IMPRESSION: 1.   Severe diverticulosis was noted in the sigmoid colon 2.   The colon mucosa was otherwise normal  RECOMMENDATIONS: continue current meds - cause of pain could be diverticulosis, could still be radicular      eSigned:  Gatha Mayer, MD, [facility] 09/22/2013 12:42 PM   CC:The Patient

## 2013-09-25 ENCOUNTER — Encounter (HOSPITAL_COMMUNITY): Payer: Self-pay | Admitting: Internal Medicine

## 2013-09-25 DIAGNOSIS — I495 Sick sinus syndrome: Secondary | ICD-10-CM | POA: Diagnosis not present

## 2013-09-25 DIAGNOSIS — Z950 Presence of cardiac pacemaker: Secondary | ICD-10-CM | POA: Diagnosis not present

## 2013-10-05 DIAGNOSIS — H01009 Unspecified blepharitis unspecified eye, unspecified eyelid: Secondary | ICD-10-CM | POA: Diagnosis not present

## 2013-10-05 DIAGNOSIS — H02059 Trichiasis without entropian unspecified eye, unspecified eyelid: Secondary | ICD-10-CM | POA: Diagnosis not present

## 2013-10-05 DIAGNOSIS — H16109 Unspecified superficial keratitis, unspecified eye: Secondary | ICD-10-CM | POA: Diagnosis not present

## 2013-10-11 DIAGNOSIS — M47817 Spondylosis without myelopathy or radiculopathy, lumbosacral region: Secondary | ICD-10-CM | POA: Diagnosis not present

## 2013-10-15 NOTE — Assessment & Plan Note (Signed)
Weight loss is encouraged 

## 2013-10-15 NOTE — Assessment & Plan Note (Signed)
Good compliance and control. He may need to increase nighttime oxygen 3 L

## 2013-10-15 NOTE — Assessment & Plan Note (Signed)
Continues oxygen dependent. He may need to increase nighttime oxygen to 3 L through his CPAP

## 2013-10-23 DIAGNOSIS — M545 Low back pain, unspecified: Secondary | ICD-10-CM | POA: Diagnosis not present

## 2013-10-23 DIAGNOSIS — M5137 Other intervertebral disc degeneration, lumbosacral region: Secondary | ICD-10-CM | POA: Diagnosis not present

## 2013-10-23 DIAGNOSIS — M479 Spondylosis, unspecified: Secondary | ICD-10-CM | POA: Diagnosis not present

## 2013-11-01 DIAGNOSIS — H9210 Otorrhea, unspecified ear: Secondary | ICD-10-CM | POA: Diagnosis not present

## 2013-11-01 DIAGNOSIS — H7200 Central perforation of tympanic membrane, unspecified ear: Secondary | ICD-10-CM | POA: Diagnosis not present

## 2013-11-01 DIAGNOSIS — H908 Mixed conductive and sensorineural hearing loss, unspecified: Secondary | ICD-10-CM | POA: Diagnosis not present

## 2013-11-02 ENCOUNTER — Telehealth: Payer: Self-pay | Admitting: Internal Medicine
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
## 2013-11-07 ENCOUNTER — Encounter: Payer: Self-pay | Admitting: Internal Medicine

## 2013-11-07 ENCOUNTER — Other Ambulatory Visit: Payer: Medicare Other

## 2013-11-07 ENCOUNTER — Ambulatory Visit (INDEPENDENT_AMBULATORY_CARE_PROVIDER_SITE_OTHER): Payer: Medicare Other | Admitting: Internal Medicine

## 2013-11-07 VITALS — BP 134/80 | HR 63 | Ht 66.0 in | Wt 244.8 lb

## 2013-11-07 DIAGNOSIS — J449 Chronic obstructive pulmonary disease, unspecified: Secondary | ICD-10-CM

## 2013-11-07 DIAGNOSIS — I2510 Atherosclerotic heart disease of native coronary artery without angina pectoris: Secondary | ICD-10-CM | POA: Diagnosis not present

## 2013-11-07 DIAGNOSIS — J309 Allergic rhinitis, unspecified: Secondary | ICD-10-CM | POA: Diagnosis not present

## 2013-11-07 DIAGNOSIS — I2589 Other forms of chronic ischemic heart disease: Secondary | ICD-10-CM

## 2013-11-07 DIAGNOSIS — G4733 Obstructive sleep apnea (adult) (pediatric): Secondary | ICD-10-CM | POA: Diagnosis not present

## 2013-11-07 DIAGNOSIS — J438 Other emphysema: Secondary | ICD-10-CM

## 2013-11-07 DIAGNOSIS — J439 Emphysema, unspecified: Secondary | ICD-10-CM

## 2013-11-07 NOTE — Progress Notes (Signed)
04/10/11- 80 year old male former smoker, Veteran, who had been a patient of mine in the old [facility]. He is coming to reestablish after moving back from [place] to [place]. History of asthma, COPD, obstructive sleep apnea complicated by DM, obesity, CAD/ischemic CM/sick sinus/bradycardia tachycardia with pacemaker, history of prostate cancer, anemia. He reports feeling stable today. New carpet smell in his apartment is irritating to his breathing but he denies routine cough or recent bronchitis. He has not felt the need to use his bronchodilators in 4 months. Walking distances limited more by degenerative joint hip pain. He can walk a maximum of 10 minutes on level ground and very little on hills or stairs. He denies blood, adenopathy, discolored sputum or swelling. He usually has trace ankle edema with no history of venous thromboembolic disease. CAD treated with CABG, stent and pacemaker but he has not had infarction. Did have pneumonia at age 62. He declines flu vaccine. Has had pneumonia vaccine. Long-term diagnosis of sleep apnea. NPSG 01/27/06-AHI 59.9 per hour. CPAP 12 plus oxygen 2 L for sleep, with good compliance and control. He will want to change to a local provider.  07/14/11- 80 year old male former smoker, Veteran,  History of asthma, COPD, obstructive sleep apnea complicated by DM, obesity, CAD/ischemic CM/sick sinus/bradycardia tachycardia with pacemaker, history of prostate cancer, anemia He had his first flu vaccine in 10 years this winter and avoided any significant respiratory infection. He no longer thinks he needs daytime oxygen. He is wearing it only at night, with his CPAP. Scheduled for right hip replacement in March. Chest x-ray 04/15/2011-no acute process, pacemaker PFT 04/23/2011-mild COPD  08/14/11-  80 year old male former smoker, Veteran,  History of asthma, COPD, obstructive sleep apnea complicated by DM, obesity, CAD/ischemic CM/sick sinus/bradycardia tachycardia/ pacemaker,  history of prostate cancer, anemia He is pending hip replacement by Dr. Tonita Cong on March 15. Asks prior authorization. I explained his increased risk of cardiopulmonary complications from any surgery and recognition that he is probably stable and near his best function now for necessary surgery. He is trying to move from his current apartment to a smoke free facility. A neighbor smokes heavily and the tobacco odor comes into the air conditioning ducts. He continues good compliance and symptom control using CPAP 12 with O2 2 L every night. He will use those settings while he is at the hospital for sleep and in recovery after extubation.  10/26/11- 80 year old male former smoker, Veteran,  History of asthma, COPD, obstructive sleep apnea complicated by DM, obesity, CAD/ischemic CM/sick sinus/bradycardia tachycardia/ pacemaker, history of prostate cancer, anemia Pt states having a productive cough  yellowish brown ,,increase sob wheezing ,chest congestion, cold chills .  Had right total hip replacement and finished physical therapy. Pain still limits walking. No respiratory complications. Now for 3 days had increased cough, yellow sputum, shortness of breath, wheeze, cold chills. Continued reflux symptoms on Nexium once daily. Discussion of steroid therapy and side effects.  11/05/11- 80 year old male former smoker, Veteran,  History of asthma, COPD, obstructive sleep apnea complicated by DM, obesity, CAD/ischemic CM/sick sinus/bradycardia tachycardia/ pacemaker, history of prostate cancer, anemia ACUTE VISIT: SOB and cough-productive yellow in color.Would like Zpak. Seen 10 days ago and Depo-Medrol and Z-Pak did help at that visit. Comfortable with CPAP. He takes it off to cough.  01/04/12- 80 year old male former smoker, Veteran,  History of asthma, COPD, obstructive sleep apnea complicated by DM, obesity, CAD/ischemic CM/sick sinus/bradycardia tachycardia/ pacemaker, history of prostate cancer,  anemia. Needs recert for O2 at night per  [facility]; pt states he feels like he needs O2 during the day as well. He says he sleeps and feels better when  using oxygen. Continue CPAP 12/Advanced, with oxygen at 2 L currently. COPD assessment test (CAT) 31/40.  03/07/12- 80 year old male former smoker, Veteran,  History of asthma, COPD, obstructive sleep apnea complicated by DM, obesity, CAD/ischemic CM/sick sinus/bradycardia tachycardia/ pacemaker, history of prostate cancer, anemia. Has good and bad days; able to move through the home with O2 (3L) on. Has had flu vaccine He qualified for home oxygen and uses it 90% of the time. Needs to wear it to clean his apartment. CPAP 12 "can't sleep without it". Walks laps in parking lot for exercise.  09/13/12- 80-year-old male former smoker, Veteran,  History of asthma, COPD, obstructive sleep apnea complicated by DM, obesity, CAD/ischemic CM/sick sinus/bradycardia tachycardia/ pacemaker, history of prostate cancer, anemia. Follows For:  Requalify for O2 for [facility] -  Pt doesnt feel like he needs O2 - SOB with increased housework activity - PT had fall 2 weeks ago and hurt back - Scheduled for CT this week Isn't sure he needs portable oxygen but does have concentrator in his house for sleep and as needed. He can't sleep without CPAP 12/ APS all night, every night, O2 2L/ Advanced. CXR 11/05/11 IMPRESSION:  Bibasilar bronchiectasis with new associated nodular air space  disease, indicative of an infectious bronchiolitis or  bronchopneumonia.  Original Report Authenticated By: Luretha Rued, M.D.  03/16/13-  80 year old male former smoker, Veteran,  History of asthma, COPD, obstructive sleep apnea complicated by DM, obesity, CAD/ischemic CM/sick sinus/bradycardia tachycardia/ pacemaker, history of prostate cancer, anemia. CPAP 12 / O2 2L/ APS sleep. Would like not to need O2 for exertion.  needs o2 recertified.  Breathing is unchanged.  Wearing cpap every night.  No  problems with mask or pressure.   Dizzy if he stands quickly and questions blood pressure medicines. Sees Dr Asa Lente tomorrow. CXR 02/02/13 IMPRESSION:  No acute abnormalities.  Original Report Authenticated By: Lorriane Shire, M.D. Exercise oximetry 03/16/13- rest RA 93%, walking RA 87%, Walking 2L O2 98%.   09/14/13-  80 year old male former smoker, Veteran,  History of asthma, COPD, obstructive sleep apnea complicated by DM, obesity, CAD/ischemic CM/sick sinus/bradycardia tachycardia/ pacemaker, history of prostate cancer, anemia. CPAP 12 / O2 2L/ Advanced sleep. FOLLOWS FOR: back pain continues to make it hard to breath. Otherwise continues to wear O2 during day on pulsing and O2 concentrator at night. Desat on room air at rest today to 88%. CXR 07/17/13 IMPRESSION:  Sequential pacemaker in place. Post CABG. Cardiomegaly.  Calcified tortuous aorta.  Mild central pulmonary vascular prominence stable.  Basilar scarring/subsegmental atelectasis without segmental  consolidation.  Electronically Signed  By: Chauncey Cruel M.D.  On: 07/17/2013 16:32  11/07/13- 80 year old male former smoker, Veteran,  History of asthma, COPD, obstructive sleep apnea complicated by DM, obesity, CAD/ischemic CM/sick sinus/bradycardia tachycardia/ pacemaker, history of prostate cancer, anemia. CPAP 12 / O2 2L/ Advanced sleep. Dyspnea on exertion with pulse oxygen, okay at rest. He worries about being allergic to bushes around his home because he has a "bitter taste" in his mouth. Neighbor upstairs smokes and the odor bothers patient. Continues CPAP all night every night and believes it helps him Walk test on oxygen- 11/07/13- 3 laps x 185 feet, lowest sat 91 % on 2L pulsed O2  ROS-see HPI Constitutional:   No-   weight loss, night sweats, fevers, chills, fatigue, lassitude. HEENT:   No-  headaches, difficulty  swallowing, tooth/dental problems, sore throat,       No-  sneezing, itching, ear ache, nasal congestion, post  nasal drip,  CV:  No- recent  chest pain, orthopnea, PND. +Mild chronic swelling in lower extremities, No- anasarca,                         dizziness, palpitations Resp: +  shortness of breath with exertion or at rest.              No- productive cough,  + non-productive cough,  No- coughing up of blood.              No- change in color of mucus.  No- wheezing.   Skin: No-   rash or lesions. GI:  No-   heartburn, indigestion, abdominal pain, nausea, vomiting,  GU:  MS: . Limiting hip pain Neuro-     nothing unusual Psych:  No- change in mood or affect. No depression or anxiety.  No memory loss.  OBJ General- Alert, Oriented, Affect-appropriate, Distress- none acute, overweight, O2 2 L Skin- rash-none, lesions- none, excoriation- none Lymphadenopathy- none Head- atraumatic            Eyes- Gross vision intact, PERRLA, conjunctivae clear secretions            Ears- Hearing aids, HOH            Nose- Clear, no-Septal dev, mucus, polyps, erosion, perforation             Throat- Mallampati II , mucosa clear , drainage- none, tonsils- atrophic Neck- flexible , trachea midline, no stridor , thyroid nl, carotid no bruit Chest - symmetrical excursion , unlabored           Heart/CV- RRR +bigeminal pulse , no murmur , no gallop  , no rub, nl s1 s2                           - JVD- none , edema- none, stasis changes- none, varices- none           Lung- +few crackles, unlabored, wheeze- none, cough-none,   dullness-none, rub- none.                     Raspy laugh.           Chest wall- pacemaker left chest Abd- Br/ Gen/ Rectal- Not done, not indicated Extrem- cyanosis- none, clubbing, none, atrophy- none, strength- deconditioned, +cane Neuro- grossly intact to observation

## 2013-11-07 NOTE — Patient Instructions (Signed)
Order- walk test on pulse O2 2L for oximetry  Order lab- Allergy profile   Dx Allergic rhinbitis

## 2013-11-08 LAB — ALLERGY FULL PROFILE
Allergen, D pternoyssinus,d7: <0.1 kU/L
Allergen,Goose feathers, e70: <0.1 kU/L
Alternaria Alternata: <0.1 kU/L
Aspergillus fumigatus, m3: <0.1 kU/L
Bahia Grass: <0.1 kU/L
Bermuda Grass: <0.1 kU/L
Box Elder IgE: <0.1 kU/L
Candida Albicans: <0.1 kU/L
Cat Dander: <0.1 kU/L
Common Ragweed: <0.1 kU/L
Curvularia lunata: <0.1 kU/L
D. farinae: <0.1 kU/L
Dog Dander: <0.1 kU/L
Elm IgE: <0.1 kU/L
Fescue: <0.1 kU/L
G005 Rye, Perennial: <0.1 kU/L
G009 Red Top: <0.1 kU/L
Goldenrod: <0.1 kU/L
Helminthosporium halodes: <0.1 kU/L
House Dust Hollister: <0.1 kU/L
IgE (Immunoglobulin E), Serum: 7.8 IU/mL (ref 0.0–180.0)
Lamb's Quarters: <0.1 kU/L
Oak: <0.1 kU/L
Plantain: <0.1 kU/L
Stemphylium Botryosum: <0.1 kU/L
Sycamore Tree: <0.1 kU/L
Timothy Grass: <0.1 kU/L

## 2013-11-09 ENCOUNTER — Ambulatory Visit: Payer: Medicare Other | Admitting: Internal Medicine

## 2013-11-20 ENCOUNTER — Ambulatory Visit (INDEPENDENT_AMBULATORY_CARE_PROVIDER_SITE_OTHER): Payer: Medicare Other | Admitting: Internal Medicine

## 2013-11-20 ENCOUNTER — Encounter: Payer: Self-pay | Admitting: Internal Medicine

## 2013-11-20 VITALS — BP 112/72 | HR 69 | Temp 98.2°F | Wt 245.4 lb

## 2013-11-20 DIAGNOSIS — J019 Acute sinusitis, unspecified: Secondary | ICD-10-CM | POA: Diagnosis not present

## 2013-11-20 DIAGNOSIS — I2510 Atherosclerotic heart disease of native coronary artery without angina pectoris: Secondary | ICD-10-CM | POA: Diagnosis not present

## 2013-11-20 MED ORDER — AMOXICILLIN-POT CLAVULANATE 875-125 MG PO TABS: 1.0000 | ORAL_TABLET | Freq: Two times a day (BID) | ORAL | Status: AC

## 2013-11-20 NOTE — Patient Instructions (Signed)
It was good to see you today.  Augmentin antibiotics 2x/day x 10 days- Your prescription(s) have been submitted to your pharmacy. Please take as directed and contact our office if you believe you are having problem(s) with the medication(s).  Other medications reviewed and updated - no other changes recommended   Alternate between ibuprofen and tylenol for aches, pain and fever symptoms as discussed  Hydrate, rest and call if worse or unimproved

## 2013-11-20 NOTE — Progress Notes (Signed)
Subjective:    Patient ID: Eric [name], male    DOB: 10-03-1926, 80 y.o.   MRN: [id number]  HPI  Patient here for nasal drainage and concern for sinus infx "smell bad things that no one else smells" Hx same symptoms with prior sinus infection  Also reviewed chronic medical issues and interval medical events  Past Medical History  Diagnosis Date  . LACTOSE INTOLERANCE   . OBESITY   . CARDIOMYOPATHY, ISCHEMIC   . AORTIC SCLEROSIS   . SICK SINUS/ TACHY-BRADY SYNDROME 09/2007    s/p PPM st judes  . PERIPHERAL VASCULAR DISEASE   . CAROTID BRUIT, RIGHT 02/27/2008  . IBS (irritable bowel syndrome)   . ALLERGIC RHINITIS   . ANEMIA-NOS   . OA (osteoarthritis)   . COPD   . GERD   . HYPERLIPIDEMIA   . HIATAL HERNIA   . Diverticulosis   . Prostate cancer     seed implants 2004  . DIABETES MELLITUS-TYPE II     diet controlled  . CORONARY ARTERY DISEASE     CABG 1995, PTCA/DES 2008, 2009 and 08/2010  . HYPERTENSION   . Asthma   . Sleep apnea   . Partial small bowel obstruction   . Primary hyperparathyroidism     Lab Results Component Value Date  PTH 150.7* 02/13/2013  CALCIUM 11.0* 02/13/2013  CAION 1.21 03/15/2008    . SMALL BOWEL OBSTRUCTION 04/18/2009    Qualifier: History of  By: Asa Lente MD, Jannifer Rodney Cataract     surgery    Review of Systems  Constitutional: Positive for fatigue. Negative for fever and unexpected weight change.  HENT: Positive for congestion, postnasal drip and sinus pressure. Negative for dental problem, facial swelling and mouth sores.   Respiratory: Negative for cough and shortness of breath.   Cardiovascular: Negative for chest pain and leg swelling.       Objective:   Physical Exam  BP 112/72  Pulse 69  Temp(Src) 98.2 F (36.8 C) (Oral)  Wt 245 lb 6.4 oz (111.313 kg)  SpO2 93% Wt Readings from Last 3 Encounters:  11/20/13 245 lb 6.4 oz (111.313 kg)  11/07/13 244 lb 12.8 oz (111.041 kg)  09/15/13 242 lb 9.6 oz (110.043 kg)    Constitutional: he is obese, but appears well-developed and well-nourished. No distress.  HENT: NCAT, nares with R>L turbinate swelling, no discharge - OP with erythema and thick PND, no oral lesions Neck: Thick, Normal range of motion. Neck supple. No JVD present. No thyromegaly present.  Cardiovascular: Normal rate, regular rhythm and normal heart sounds.  No murmur heard. No BLE edema. Pulmonary/Chest: Effort normal and breath sounds normal. No respiratory distress. he has no wheezes.  Psychiatric: he has a normal mood and affect. His behavior is normal. Judgment and thought content normal.   Lab Results  Component Value Date   WBC 4.7 07/18/2013   HGB 12.7* 07/18/2013   HCT 37.4* 07/18/2013   PLT 131* 07/18/2013   GLUCOSE 125* 07/17/2013   CHOL 110 04/12/2013   TRIG 120 04/12/2013   HDL 33* 04/12/2013   LDLCALC 53 04/12/2013   ALT 21 07/17/2013   AST 28 07/17/2013   NA 137 07/17/2013   K 4.2 07/17/2013   CL 99 07/17/2013   CREATININE 0.88 07/18/2013   BUN 17 07/17/2013   CO2 22 07/17/2013   TSH 1.33 04/12/2013   PSA 0.02* 09/10/2006   INR 1.00 08/13/2011   HGBA1C 6.9* 07/26/2013  MICROALBUR 0.8 09/30/2012    No results found.     Assessment & Plan:   Acute sinusitis with adverse sensation of odor turbinate swelling right greater than left consistent with right sided maxillary symptoms  Augmentin twice a day x10 days Continue treatment for allergic rhinitis as per ENT and pulmonary Patient to followup with dentist if symptoms unimproved following antibiotic therapy

## 2013-11-20 NOTE — Progress Notes (Signed)
Pre visit review using our clinic review tool, if applicable. No additional management support is needed unless otherwise documented below in the visit note. 

## 2013-11-23 DIAGNOSIS — H9190 Unspecified hearing loss, unspecified ear: Secondary | ICD-10-CM | POA: Diagnosis not present

## 2013-11-23 DIAGNOSIS — H7200 Central perforation of tympanic membrane, unspecified ear: Secondary | ICD-10-CM | POA: Diagnosis not present

## 2013-11-23 DIAGNOSIS — R439 Unspecified disturbances of smell and taste: Secondary | ICD-10-CM | POA: Diagnosis not present

## 2013-11-24 ENCOUNTER — Other Ambulatory Visit: Payer: Self-pay | Admitting: Otolaryngology

## 2013-11-24 DIAGNOSIS — R439 Unspecified disturbances of smell and taste: Secondary | ICD-10-CM

## 2013-11-27 DIAGNOSIS — H02059 Trichiasis without entropian unspecified eye, unspecified eyelid: Secondary | ICD-10-CM | POA: Diagnosis not present

## 2013-11-27 DIAGNOSIS — H579 Unspecified disorder of eye and adnexa: Secondary | ICD-10-CM | POA: Diagnosis not present

## 2013-11-29 ENCOUNTER — Ambulatory Visit
Admission: RE | Admit: 2013-11-29 | Discharge: 2013-11-29 | Disposition: A | Discharge status: Home / Self Care | Payer: Medicare Other | Source: Ambulatory Visit | Attending: Otolaryngology | Admitting: Otolaryngology

## 2013-11-29 DIAGNOSIS — R439 Unspecified disturbances of smell and taste: Secondary | ICD-10-CM

## 2013-11-29 DIAGNOSIS — H538 Other visual disturbances: Secondary | ICD-10-CM | POA: Diagnosis not present

## 2013-11-29 MED ORDER — IOHEXOL 300 MG/ML  SOLN
75.0000 mL | Freq: Once | INTRAMUSCULAR | Status: AC | PRN
  Administered 2013-11-29: 75 mL via INTRAVENOUS

## 2013-12-11 DIAGNOSIS — H579 Unspecified disorder of eye and adnexa: Secondary | ICD-10-CM | POA: Diagnosis not present

## 2013-12-11 DIAGNOSIS — H02059 Trichiasis without entropian unspecified eye, unspecified eyelid: Secondary | ICD-10-CM | POA: Diagnosis not present

## 2013-12-11 DIAGNOSIS — H04129 Dry eye syndrome of unspecified lacrimal gland: Secondary | ICD-10-CM | POA: Diagnosis not present

## 2013-12-11 DIAGNOSIS — H16109 Unspecified superficial keratitis, unspecified eye: Secondary | ICD-10-CM | POA: Diagnosis not present

## 2013-12-15 ENCOUNTER — Telehealth: Payer: Self-pay | Admitting: *Deleted

## 2013-12-15 NOTE — Telephone Encounter (Signed)
Left msg on triage wanting refill on amoxillian. Called pt back inform him will need to have appt for antibiotic. Transferred to scheduler made appt sat clinic...Eric [name]

## 2013-12-16 ENCOUNTER — Encounter: Payer: Self-pay | Admitting: Family Medicine

## 2013-12-16 ENCOUNTER — Ambulatory Visit (INDEPENDENT_AMBULATORY_CARE_PROVIDER_SITE_OTHER): Payer: Medicare Other | Admitting: Family Medicine

## 2013-12-16 VITALS — BP 132/52 | HR 60 | Temp 97.9°F | Wt 235.0 lb

## 2013-12-16 DIAGNOSIS — I2510 Atherosclerotic heart disease of native coronary artery without angina pectoris: Secondary | ICD-10-CM

## 2013-12-16 DIAGNOSIS — R109 Unspecified abdominal pain: Secondary | ICD-10-CM | POA: Diagnosis not present

## 2013-12-16 MED ORDER — AMOXICILLIN-POT CLAVULANATE 875-125 MG PO TABS: 1.0000 | ORAL_TABLET | Freq: Two times a day (BID) | ORAL | Status: DC

## 2013-12-16 NOTE — Patient Instructions (Signed)
Restart the antibiotics today and notify Dr. Asa Lente on Monday.   If you have more pain or fevers, then go to the ER.  Clear liquid diet in the meantime (broth, etc).  Take care.

## 2013-12-16 NOTE — Assessment & Plan Note (Signed)
Nontoxic, this feels similar to prev episodes of diverticulitis.  D/w pt.  Okay for outpatient f/u.  He agrees.  Restart augmentin and notify PCP Monday re: status.  If worsening, to ER.  He agrees.

## 2013-12-16 NOTE — Progress Notes (Signed)
Pre visit review using our clinic review tool, if applicable. No additional management support is needed unless otherwise documented below in the visit note.  Was on augmentin about 1 month ago for sinus infection.  That resolved.  He has had abd for the last few weeks, since the last OV.  L sided pain only.  H/o diverticulitis prev.  No fevers.  More foul smelling gas.  No blood in stool.  Pain is constant, sometimes worse than others.  Moving around, walking, makes it hurt worse.  No R sided abd pain.  No diarrhea.  No mucous in his stools.  No vomiting.  No burning with urination.  This feels similar to prev episodes of diverticulitis.  Last BM was yesterday AM, no tarry stools.   Meds, vitals, and allergies reviewed.   ROS: See HPI.  Otherwise, noncontributory.  Nad ncat Mmm Neck supple, no LA rrr ctab Abd soft, not ttp except for the L side of abd, no rebound, normal BS Ext well perfused.   No CVA pain

## 2013-12-20 ENCOUNTER — Encounter: Payer: Self-pay | Admitting: Internal Medicine

## 2013-12-20 ENCOUNTER — Telehealth: Payer: Self-pay | Admitting: Internal Medicine

## 2013-12-20 ENCOUNTER — Other Ambulatory Visit (INDEPENDENT_AMBULATORY_CARE_PROVIDER_SITE_OTHER): Payer: Medicare Other

## 2013-12-20 ENCOUNTER — Ambulatory Visit (INDEPENDENT_AMBULATORY_CARE_PROVIDER_SITE_OTHER): Payer: Medicare Other | Admitting: Internal Medicine

## 2013-12-20 VITALS — BP 124/50 | HR 61 | Temp 98.0°F | Ht 66.0 in | Wt 240.0 lb

## 2013-12-20 DIAGNOSIS — E119 Type 2 diabetes mellitus without complications: Secondary | ICD-10-CM

## 2013-12-20 DIAGNOSIS — K5732 Diverticulitis of large intestine without perforation or abscess without bleeding: Secondary | ICD-10-CM

## 2013-12-20 DIAGNOSIS — R109 Unspecified abdominal pain: Secondary | ICD-10-CM

## 2013-12-20 DIAGNOSIS — I2510 Atherosclerotic heart disease of native coronary artery without angina pectoris: Secondary | ICD-10-CM | POA: Diagnosis not present

## 2013-12-20 DIAGNOSIS — M545 Low back pain, unspecified: Secondary | ICD-10-CM | POA: Diagnosis not present

## 2013-12-20 DIAGNOSIS — G8929 Other chronic pain: Secondary | ICD-10-CM

## 2013-12-20 LAB — CBC WITH DIFFERENTIAL/PLATELET
BASOS PCT: 0.3 % (ref 0.0–3.0)
Basophils Absolute: 0 10*3/uL (ref 0.0–0.1)
EOS PCT: 6.7 % — AB (ref 0.0–5.0)
Eosinophils Absolute: 0.3 10*3/uL (ref 0.0–0.7)
HEMATOCRIT: 35.2 % — AB (ref 39.0–52.0)
HEMOGLOBIN: 11.9 g/dL — AB (ref 13.0–17.0)
LYMPHS ABS: 0.4 10*3/uL — AB (ref 0.7–4.0)
Lymphocytes Relative: 9.5 % — ABNORMAL LOW (ref 12.0–46.0)
MCHC: 33.8 g/dL (ref 30.0–36.0)
MCV: 99.6 fl (ref 78.0–100.0)
MONOS PCT: 15.3 % — AB (ref 3.0–12.0)
Monocytes Absolute: 0.7 10*3/uL (ref 0.1–1.0)
NEUTROS ABS: 3 10*3/uL (ref 1.4–7.7)
Neutrophils Relative %: 68.2 % (ref 43.0–77.0)
Platelets: 141 10*3/uL — ABNORMAL LOW (ref 150.0–400.0)
RBC: 3.53 Mil/uL — AB (ref 4.22–5.81)
RDW: 13.2 % (ref 11.5–15.5)
WBC: 4.4 10*3/uL (ref 4.0–10.5)

## 2013-12-20 LAB — HEPATIC FUNCTION PANEL
ALBUMIN: 4 g/dL (ref 3.5–5.2)
ALT: 19 U/L (ref 0–53)
AST: 29 U/L (ref 0–37)
Alkaline Phosphatase: 60 U/L (ref 39–117)
Bilirubin, Direct: 0.1 mg/dL (ref 0.0–0.3)
TOTAL PROTEIN: 6.8 g/dL (ref 6.0–8.3)
Total Bilirubin: 0.7 mg/dL (ref 0.2–1.2)

## 2013-12-20 LAB — HEMOGLOBIN A1C: HEMOGLOBIN A1C: 7 % — AB (ref 4.6–6.5)

## 2013-12-20 LAB — BASIC METABOLIC PANEL
BUN: 12 mg/dL (ref 6–23)
CO2: 28 mEq/L (ref 19–32)
Calcium: 10.4 mg/dL (ref 8.4–10.5)
Chloride: 103 mEq/L (ref 96–112)
Creatinine, Ser: 1 mg/dL (ref 0.4–1.5)
GFR: 76.41 mL/min (ref >60.00)
Glucose, Bld: 130 mg/dL — ABNORMAL HIGH (ref 70–99)
POTASSIUM: 4.6 meq/L (ref 3.5–5.1)
SODIUM: 136 meq/L (ref 135–145)

## 2013-12-20 MED ORDER — GABAPENTIN 100 MG PO CAPS: 100.0000 mg | ORAL_CAPSULE | Freq: Three times a day (TID) | ORAL | Status: DC

## 2013-12-20 NOTE — Progress Notes (Signed)
Subjective:    Patient ID: Eric [name], male    DOB: [birth year], 80 y.o.   MRN: [id number]  Abdominal Pain This is a new problem. The current episode started 1 to 4 weeks ago. The onset quality is gradual. The problem occurs constantly. The problem has been gradually worsening. The pain is located in the LLQ. The pain is at a severity of 7/10. The pain is moderate. The quality of the pain is cramping, a sensation of fullness and aching. The abdominal pain does not radiate. Associated symptoms include constipation and diarrhea. Pertinent negatives include no anorexia, dysuria, fever, frequency, melena, myalgias, vomiting or weight loss. The pain is aggravated by certain positions, eating and movement. The pain is relieved by being still and bowel movements. He has tried oral narcotic analgesics for the symptoms. The treatment provided mild relief. Prior diagnostic workup includes GI consult and lower endoscopy. His past medical history is significant for irritable bowel syndrome.    Patient here for follow up of continued symptoms - tx with antibiotics 7/11 for recurrent diverticulitis symptoms    Past Medical History  Diagnosis Date  . LACTOSE INTOLERANCE   . OBESITY   . CARDIOMYOPATHY, ISCHEMIC   . AORTIC SCLEROSIS   . SICK SINUS/ TACHY-BRADY SYNDROME 09/2007    s/p PPM st judes  . PERIPHERAL VASCULAR DISEASE   . CAROTID BRUIT, RIGHT 02/27/2008  . IBS (irritable bowel syndrome)   . ALLERGIC RHINITIS   . ANEMIA-NOS   . OA (osteoarthritis)   . COPD   . GERD   . HYPERLIPIDEMIA   . HIATAL HERNIA   . Diverticulosis   . Prostate cancer     seed implants 2004  . DIABETES MELLITUS-TYPE II     diet controlled  . CORONARY ARTERY DISEASE     CABG 1995, PTCA/DES 2008, 2009 and 08/2010  . HYPERTENSION   . Asthma   . Sleep apnea   . Partial small bowel obstruction   . Primary hyperparathyroidism     Lab Results Component Value Date  PTH 150.7* 02/13/2013  CALCIUM 11.0* 02/13/2013   CAION 1.21 03/15/2008    . SMALL BOWEL OBSTRUCTION 04/18/2009    Qualifier: History of  By: Asa Lente MD, Jannifer Rodney Cataract     surgery    Review of Systems  Constitutional: Negative for fever and weight loss.  Gastrointestinal: Positive for abdominal pain, diarrhea and constipation. Negative for vomiting, melena and anorexia.  Genitourinary: Negative for dysuria and frequency.  Musculoskeletal: Negative for myalgias.       Objective:   Physical Exam  BP 124/50  Pulse 61  Temp(Src) 98 F (36.7 C) (Oral)  Ht 5\' 6"  (1.676 m)  Wt 240 lb (108.863 kg)  BMI 38.76 kg/m2  SpO2 96% Wt Readings from Last 3 Encounters:  12/20/13 240 lb (108.863 kg)  12/16/13 235 lb (106.595 kg)  11/20/13 245 lb 6.4 oz (111.313 kg)   Constitutional: he is MO, but appears well-developed and well-nourished. Uncomfortable, but no acute distress.  Neck: Normal range of motion. Neck supple. No JVD present. No thyromegaly present.  Cardiovascular: Normal rate, regular rhythm and normal heart sounds.  No murmur heard. No BLE edema. Pulmonary/Chest: Effort normal and breath sounds normal. No respiratory distress. he has no wheezes.  Abdomen: obese, protuberant, bowel sounds present. No rebound or guarding but tender to palpation over LLQ Psychiatric: he has a normal mood and affect. His behavior is normal. Judgment and thought content normal.  Lab Results  Component Value Date   WBC 4.7 07/18/2013   HGB 12.7* 07/18/2013   HCT 37.4* 07/18/2013   PLT 131* 07/18/2013   GLUCOSE 125* 07/17/2013   CHOL 110 04/12/2013   TRIG 120 04/12/2013   HDL 33* 04/12/2013   LDLCALC 53 04/12/2013   ALT 21 07/17/2013   AST 28 07/17/2013   NA 137 07/17/2013   K 4.2 07/17/2013   CL 99 07/17/2013   CREATININE 0.88 07/18/2013   BUN 17 07/17/2013   CO2 22 07/17/2013   TSH 1.33 04/12/2013   PSA 0.02* 09/10/2006   INR 1.00 08/13/2011   HGBA1C 6.9* 07/26/2013   MICROALBUR 0.8 09/30/2012    Ct Head W Wo Contrast  11/29/2013   CLINICAL DATA:   Dysosmia, occasional bitter taste.  Blurry vision.  EXAM: CT HEAD WITHOUT AND WITH CONTRAST  TECHNIQUE: Contiguous axial images were obtained from the base of the skull through the vertex without and with intravenous contrast  CONTRAST:  45mL OMNIPAQUE IOHEXOL 300 MG/ML  SOLN  BUN and creatinine were obtained on site at [facility] at  [address]  Results:  BUN 11.0 mg/dL,  Creatinine 0.8 mg/dL.  COMPARISON:  Sinus CT 11/25/2004.  FINDINGS: No evidence for acute infarction, hemorrhage, mass lesion, hydrocephalus, or extra-axial fluid. Appropriate for age cerebral and cerebellar atrophy. Chronic microvascular ischemic change affects the periventricular and subcortical white matter. Post infusion, no abnormal enhancement of the brain or meninges. Focal area of hypoattenuation right posterior frontal cortex and subcortical white matter likely remote cortical infarction.  Calvarium intact. No visible sinus or mastoid disease. Mild vascular calcification, not unexpected for age.  Within limits of evaluation, careful evaluation of the subfrontal region reveals no areas of concern which might represent a mass or abnormal enhancement. No destructive process is observed involving the cribriform plate, crista galli, posterior frontal sinus wall, or anterior ethmoid air cells. Incompletely visualized orbits show no gross asymmetry.  When correlated with the exam in 2006, the appearance is similar.  IMPRESSION: Normal for age cerebral volume. 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  No acute intracranial findings. No abnormal postcontrast enhancement. No visible subfrontal mass or inflammatory process.   Electronically Signed   By: Rolla Flatten M.D.   On: 11/29/2013 11:32     Flex sig 09/12/13 reviewed: severe sigmoid diverticulosis    Assessment & Plan:   Acute sigmoid diverticulitis - Acute on chronic LLQ pain - ongoing acute symptoms approx 2 weeks -  Not associated  with change in bowel - continued chronic IBS pattern: alt constipation with diarrhea Check labs continue antibiotics and diet Pain control as ongoing - hydrocodone - and add gabapentin  Problem List Items Addressed This Visit   Chronic abdominal pain     Multifactorial chronic symptoms related to recurrent diverticulitis flares, IBS and referred pain from spinal stenosis (as pain symptoms improve following intervention by Ramos for spinal stenosis and hip injections) tx acute problems today with continue antibiotics - see above    Relevant Medications      gabapentin (NEURONTIN) capsule   Other Relevant Orders      Hepatic function panel   DIABETES MELLITUS-TYPE II      "diet controlled" no longer follows with endo (prev [facility] k[facility]) or the [place] actos caused edema/fluid retention and januvia stopped summer 2014 (?cost) Intolerant of metformin due to nausea and "hospitalization"  Lab Results  Component Value Date   HGBA1C 6.9* 07/26/2013  Relevant Orders      Hemoglobin A1c      Basic metabolic panel   LOW BACK PAIN, CHRONIC     History of surgical intervention July 2010 for same -  following with [facility] for same - ongoing care and ESI by Dr. Sherlene Shams associated with chronic right leg pain, unclear by history if this is hip or back etiology Also LLQ pain from spinal stenosis - see above follow up ortho this week as planned     Other Visit Diagnoses   Diverticulitis of sigmoid colon    -  Primary    Relevant Orders       Basic metabolic panel       CBC with Differential       Hepatic function panel

## 2013-12-20 NOTE — Progress Notes (Signed)
Pre visit review using our clinic review tool, if applicable. No additional management support is needed unless otherwise documented below in the visit note. 

## 2013-12-20 NOTE — Telephone Encounter (Signed)
Patient is currently being treated with antibiotics for diverticulitis.  He is advised to continue his antibiotics and can have follow up with Dr. Carlean Purl on 02/26/14.  He will call back if his pain fails to resolve prior to follow up.  See office visit note from Dr. Asa Lente today

## 2013-12-20 NOTE — Assessment & Plan Note (Signed)
History of surgical intervention July 2010 for same -  following with [facility] for same - ongoing care and ESI by Dr. Sherlene Shams associated with chronic right leg pain, unclear by history if this is hip or back etiology Also LLQ pain from spinal stenosis - see above follow up ortho this week as planned

## 2013-12-20 NOTE — Assessment & Plan Note (Signed)
Multifactorial chronic symptoms related to recurrent diverticulitis flares, IBS and referred pain from spinal stenosis (as pain symptoms improve following intervention by Ramos for spinal stenosis and hip injections) tx acute problems today with continue antibiotics - see above

## 2013-12-20 NOTE — Assessment & Plan Note (Signed)
"  diet controlled" no longer follows with endo (prev kerr and kumar) or the [place] actos caused edema/fluid retention and januvia stopped summer 2014 (?cost) Intolerant of metformin due to nausea and "hospitalization"  Lab Results  Component Value Date   HGBA1C 6.9* 07/26/2013

## 2013-12-20 NOTE — Patient Instructions (Addendum)
It was good to see you today.  We have reviewed your prior records including labs and tests today  Test(s) ordered today. Your results will be released to [facility] (or called to you) after review, usually within 72hours after test completion. If any changes need to be made, you will be notified at that same time.  Medications reviewed and updated Continue antibiotics -  Start gabapentin for pain symptoms - 100mg  three times a day - ok to use with hydrocodone if needed Your prescription(s) have been submitted to your pharmacy. Please take as directed and contact our office if you believe you are having problem(s) with the medication(s).  No other changes recommended at this time.  Continue working with your other specialists as ongoing - Carlean Purl, [facility] and [facility]  Please schedule followup in 3-4 months, call sooner if problems. Diverticulitis Diverticulitis is when small pockets that have formed in your colon (large intestine) become infected or swollen. HOME CARE  Follow your doctor's instructions.  Follow a special diet if told by your doctor.  When you feel better, your doctor may tell you to change your diet. You may be told to eat a lot of fiber. Fruits and vegetables are good sources of fiber. Fiber makes it easier to poop (have bowel movements).  Take supplements or probiotics as told by your doctor.  Only take medicines as told by your doctor.  Keep all follow-up visits with your doctor. GET HELP IF:  Your pain does not get better.  You have a hard time eating food.  You are not pooping like normal. GET HELP RIGHT AWAY IF:  Your pain gets worse.  Your problems do not get better.  Your problems suddenly get worse.  You have a fever.  You keep throwing up (vomiting).  You have bloody or black, tarry poop (stool). MAKE SURE YOU:   Understand these instructions.  Will watch your condition.  Will get help right away if you are not doing well or get  worse. Document Released: 11/11/2007 Document Revised: 05/30/2013 Document Reviewed: 04/19/2013 [facility] Patient Information 2015 [place], [place]. This information is not intended to replace advice given to you by your health care provider. Make sure you discuss any questions you have with your health care provider.

## 2013-12-21 ENCOUNTER — Telehealth: Payer: Self-pay | Admitting: Interventional Cardiology

## 2013-12-21 NOTE — Telephone Encounter (Signed)
**Note De-Identified  Obfuscation** Deanne is advised that Dr Tamala Julian and his nurse, Lattie Haw, are not in the office at this time and that this message will be forwarded to them to address when they return to the office. She verbalized understanding.

## 2013-12-21 NOTE — Telephone Encounter (Signed)
New message    Cardiac clearance to stop plavix 5 days prior. - injection on  8/11

## 2013-12-22 NOTE — Telephone Encounter (Signed)
Okay to hold plavix for 5 days.

## 2013-12-22 NOTE — Telephone Encounter (Signed)
Okay to hold plavix for 5 days

## 2013-12-25 NOTE — Telephone Encounter (Signed)
Faxed to [place].

## 2013-12-26 DIAGNOSIS — Z950 Presence of cardiac pacemaker: Secondary | ICD-10-CM | POA: Diagnosis not present

## 2013-12-26 DIAGNOSIS — I495 Sick sinus syndrome: Secondary | ICD-10-CM

## 2014-01-10 NOTE — Assessment & Plan Note (Signed)
Good compliance and control. Appropriate to continue.

## 2014-01-10 NOTE — Assessment & Plan Note (Signed)
Managed by cardiology but obviously likely to contribute to his exertional dyspnea

## 2014-01-10 NOTE — Assessment & Plan Note (Addendum)
Without acute exacerbation. Adequate oxygen saturation walking with oxygen at 2 L pulsed. Plan-walk to maintain endurance

## 2014-01-10 NOTE — Assessment & Plan Note (Signed)
Strong odors make him anxious. Not sure if there is an atopic component Plan-allergy profile

## 2014-01-15 ENCOUNTER — Ambulatory Visit (INDEPENDENT_AMBULATORY_CARE_PROVIDER_SITE_OTHER): Payer: Medicare Other | Admitting: *Deleted

## 2014-01-15 DIAGNOSIS — I495 Sick sinus syndrome: Secondary | ICD-10-CM

## 2014-01-15 DIAGNOSIS — Z8546 Personal history of malignant neoplasm of prostate: Secondary | ICD-10-CM | POA: Diagnosis not present

## 2014-01-15 LAB — MDC_IDC_ENUM_SESS_TYPE_INCLINIC
Battery Impedance: 1000 Ohm — CL
Date Time Interrogation Session: 20150810102850
Implantable Pulse Generator Model: 5826
Implantable Pulse Generator Serial Number: 2094876
Lead Channel Impedance Value: 560 Ohm
Lead Channel Pacing Threshold Amplitude: 0.875 V
Lead Channel Sensing Intrinsic Amplitude: 3.8 mV
Lead Channel Sensing Intrinsic Amplitude: 9.6 mV
Lead Channel Setting Pacing Amplitude: 2 V
Lead Channel Setting Sensing Sensitivity: 2 mV
MDC IDC MSMT BATTERY VOLTAGE: 2.76 V
MDC IDC MSMT LEADCHNL RA IMPEDANCE VALUE: 380 Ohm
MDC IDC MSMT LEADCHNL RA PACING THRESHOLD AMPLITUDE: 0.75 V
MDC IDC MSMT LEADCHNL RA PACING THRESHOLD PULSEWIDTH: 0.4 ms
MDC IDC MSMT LEADCHNL RV PACING THRESHOLD PULSEWIDTH: 0.4 ms
MDC IDC SET LEADCHNL RV PACING PULSEWIDTH: 0.4 ms

## 2014-01-15 NOTE — Progress Notes (Signed)
Pacemaker check in clinic. Normal device function. Thresholds, sensing, impedances consistent with previous measurements. Device programmed to maximize longevity. No mode switch or high ventricular rates noted. Device programmed at appropriate safety margins. Histogram distribution appropriate for patient activity level. Device programmed to optimize intrinsic conduction. Estimated longevity 6.5-9.44yrs. ROV w/ Dr. Caryl Comes in 75mo.

## 2014-01-16 DIAGNOSIS — M5137 Other intervertebral disc degeneration, lumbosacral region: Secondary | ICD-10-CM | POA: Diagnosis not present

## 2014-01-17 ENCOUNTER — Telehealth: Payer: Self-pay | Admitting: Internal Medicine

## 2014-01-17 ENCOUNTER — Telehealth: Payer: Self-pay | Admitting: Pulmonary Disease

## 2014-01-17 NOTE — Telephone Encounter (Signed)
He has suffered from sore throat for past 2 to 3 weeks.  He moved into a new apartment 6 weeks ago.  He thinks his neighbor was smoking (no smoking allowed in complex).  He is not sure if this triggered his symptoms.  He has trouble with strong smells.  His throat soreness is getting worse.  He has been feeling feverish, but hasn't been able to locate his thermometer.  His nose has a burning sensation.  He feels like he has phlegm in his throat and chest that he can't cough up.  He is getting some wheezing in his chest.  He has been using his albuterol, and uses oxygen 24/7.  He feels his cheeks are getting flush.  Advised him to use salt water gargle, prn ibuprofen, and benadryl 25 mg x one >> all for tonight.    Will route message to Triage to arrange for Acute visit for 01/18/14 with any available provider.  Pt would prefer appointment in AM if possible.  He is followed by Dr. Annamaria Boots for COPD, chronic hypoxic respiratory failure, allergies, and obstructive sleep apnea.

## 2014-01-17 NOTE — Telephone Encounter (Signed)
[id number] x1 

## 2014-01-18 ENCOUNTER — Encounter: Payer: Self-pay | Admitting: Internal Medicine

## 2014-01-18 ENCOUNTER — Ambulatory Visit (INDEPENDENT_AMBULATORY_CARE_PROVIDER_SITE_OTHER): Payer: Medicare Other | Admitting: Internal Medicine

## 2014-01-18 VITALS — BP 124/58 | HR 77 | Ht 66.0 in | Wt 235.4 lb

## 2014-01-18 DIAGNOSIS — J449 Chronic obstructive pulmonary disease, unspecified: Secondary | ICD-10-CM | POA: Diagnosis not present

## 2014-01-18 DIAGNOSIS — I2510 Atherosclerotic heart disease of native coronary artery without angina pectoris: Secondary | ICD-10-CM

## 2014-01-18 DIAGNOSIS — J4489 Other specified chronic obstructive pulmonary disease: Secondary | ICD-10-CM | POA: Diagnosis not present

## 2014-01-18 DIAGNOSIS — G4733 Obstructive sleep apnea (adult) (pediatric): Secondary | ICD-10-CM | POA: Diagnosis not present

## 2014-01-18 DIAGNOSIS — K219 Gastro-esophageal reflux disease without esophagitis: Secondary | ICD-10-CM | POA: Diagnosis not present

## 2014-01-18 NOTE — Telephone Encounter (Signed)
Inge Rise, CMA at 01/18/2014  8:36 AM      Status: Signed          Called spoke with pt. appt scheduled to see CDY at 2 PM today. Nothing further needed         Chesley Mires, MD at 01/17/2014  7:04 PM      Status: Signed          He has suffered from sore throat for past 2 to 3 weeks.  He moved into a new apartment 6 weeks ago.  He thinks his neighbor was smoking (no smoking allowed in complex).  He is not sure if this triggered his symptoms.  He has trouble with strong smells. His throat soreness is getting worse.  He has been feeling feverish, but hasn't been able to locate his thermometer.  His nose has a burning sensation.  He feels like he has phlegm in his throat and chest that he can't cough up.  He is getting some wheezing in his chest.  He has been using his albuterol, and uses oxygen 24/7.  He feels his cheeks are getting flush. Advised him to use salt water gargle, prn ibuprofen, and benadryl 25 mg x one >> all for tonight.   Will route message to Triage to arrange for Acute visit for 01/18/14 with any available provider.  Pt would prefer appointment in AM if possible. He is followed by Dr. Annamaria Boots for COPD, chronic hypoxic respiratory failure, allergies, and obstructive sleep apnea.

## 2014-01-18 NOTE — Progress Notes (Signed)
04/10/11- 80 year old male former smoker, Veteran, who had been a patient of mine in the old [facility]. He is coming to reestablish after moving back from [place] to [place]. History of asthma, COPD, obstructive sleep apnea complicated by DM, obesity, CAD/ischemic CM/sick sinus/bradycardia tachycardia with pacemaker, history of prostate cancer, anemia. He reports feeling stable today. New carpet smell in his apartment is irritating to his breathing but he denies routine cough or recent bronchitis. He has not felt the need to use his bronchodilators in 4 months. Walking distances limited more by degenerative joint hip pain. He can walk a maximum of 10 minutes on level ground and very little on hills or stairs. He denies blood, adenopathy, discolored sputum or swelling. He usually has trace ankle edema with no history of venous thromboembolic disease. CAD treated with CABG, stent and pacemaker but he has not had infarction. Did have pneumonia at age 62. He declines flu vaccine. Has had pneumonia vaccine. Long-term diagnosis of sleep apnea. NPSG 01/27/06-AHI 59.9 per hour. CPAP 12 plus oxygen 2 L for sleep, with good compliance and control. He will want to change to a local provider.  07/14/11- 80 year old male former smoker, Veteran,  History of asthma, COPD, obstructive sleep apnea complicated by DM, obesity, CAD/ischemic CM/sick sinus/bradycardia tachycardia with pacemaker, history of prostate cancer, anemia He had his first flu vaccine in 10 years this winter and avoided any significant respiratory infection. He no longer thinks he needs daytime oxygen. He is wearing it only at night, with his CPAP. Scheduled for right hip replacement in March. Chest x-ray 04/15/2011-no acute process, pacemaker PFT 04/23/2011-mild COPD  08/14/11-  80 year old male former smoker, Veteran,  History of asthma, COPD, obstructive sleep apnea complicated by DM, obesity, CAD/ischemic CM/sick sinus/bradycardia tachycardia/ pacemaker,  history of prostate cancer, anemia He is pending hip replacement by Dr. Tonita Cong on March 15. Asks prior authorization. I explained his increased risk of cardiopulmonary complications from any surgery and recognition that he is probably stable and near his best function now for necessary surgery. He is trying to move from his current apartment to a smoke free facility. A neighbor smokes heavily and the tobacco odor comes into the air conditioning ducts. He continues good compliance and symptom control using CPAP 12 with O2 2 L every night. He will use those settings while he is at the hospital for sleep and in recovery after extubation.  10/26/11- 80 year old male former smoker, Veteran,  History of asthma, COPD, obstructive sleep apnea complicated by DM, obesity, CAD/ischemic CM/sick sinus/bradycardia tachycardia/ pacemaker, history of prostate cancer, anemia Pt states having a productive cough  yellowish brown ,,increase sob wheezing ,chest congestion, cold chills .  Had right total hip replacement and finished physical therapy. Pain still limits walking. No respiratory complications. Now for 3 days had increased cough, yellow sputum, shortness of breath, wheeze, cold chills. Continued reflux symptoms on Nexium once daily. Discussion of steroid therapy and side effects.  11/05/11- 80 year old male former smoker, Veteran,  History of asthma, COPD, obstructive sleep apnea complicated by DM, obesity, CAD/ischemic CM/sick sinus/bradycardia tachycardia/ pacemaker, history of prostate cancer, anemia ACUTE VISIT: SOB and cough-productive yellow in color.Would like Zpak. Seen 10 days ago and Depo-Medrol and Z-Pak did help at that visit. Comfortable with CPAP. He takes it off to cough.  01/04/12- 80 year old male former smoker, Veteran,  History of asthma, COPD, obstructive sleep apnea complicated by DM, obesity, CAD/ischemic CM/sick sinus/bradycardia tachycardia/ pacemaker, history of prostate cancer,  anemia. Needs recert for O2 at night per  [facility]; pt states he feels like he needs O2 during the day as well. He says he sleeps and feels better when  using oxygen. Continue CPAP 12/Advanced, with oxygen at 2 L currently. COPD assessment test (CAT) 31/40.  03/07/12- 80 year old male former smoker, Veteran,  History of asthma, COPD, obstructive sleep apnea complicated by DM, obesity, CAD/ischemic CM/sick sinus/bradycardia tachycardia/ pacemaker, history of prostate cancer, anemia. Has good and bad days; able to move through the home with O2 (3L) on. Has had flu vaccine He qualified for home oxygen and uses it 90% of the time. Needs to wear it to clean his apartment. CPAP 12 "can't sleep without it". Walks laps in parking lot for exercise.  09/13/12- 89-year-old male former smoker, Veteran,  History of asthma, COPD, obstructive sleep apnea complicated by DM, obesity, CAD/ischemic CM/sick sinus/bradycardia tachycardia/ pacemaker, history of prostate cancer, anemia. Follows For:  Requalify for O2 for [facility] -  Pt doesnt feel like he needs O2 - SOB with increased housework activity - PT had fall 2 weeks ago and hurt back - Scheduled for CT this week Isn't sure he needs portable oxygen but does have concentrator in his house for sleep and as needed. He can't sleep without CPAP 12/ APS all night, every night, O2 2L/ Advanced. CXR 11/05/11 IMPRESSION:  Bibasilar bronchiectasis with new associated nodular air space  disease, indicative of an infectious bronchiolitis or  bronchopneumonia.  Original Report Authenticated By: Luretha Rued, M.D.  03/16/13-  80 year old male former smoker, Veteran,  History of asthma, COPD, obstructive sleep apnea complicated by DM, obesity, CAD/ischemic CM/sick sinus/bradycardia tachycardia/ pacemaker, history of prostate cancer, anemia. CPAP 12 / O2 2L/ APS sleep. Would like not to need O2 for exertion.  needs o2 recertified.  Breathing is unchanged.  Wearing cpap every night.  No  problems with mask or pressure.   Dizzy if he stands quickly and questions blood pressure medicines. Sees Dr Asa Lente tomorrow. CXR 02/02/13 IMPRESSION:  No acute abnormalities.  Original Report Authenticated By: Lorriane Shire, M.D. Exercise oximetry 03/16/13- rest RA 93%, walking RA 87%, Walking 2L O2 98%.   09/14/13-  80 year old male former smoker, Veteran,  History of asthma, COPD, obstructive sleep apnea complicated by DM, obesity, CAD/ischemic CM/sick sinus/bradycardia tachycardia/ pacemaker, history of prostate cancer, anemia. CPAP 12 / O2 2L/ Advanced sleep. FOLLOWS FOR: back pain continues to make it hard to breath. Otherwise continues to wear O2 during day on pulsing and O2 concentrator at night. Desat on room air at rest today to 88%. CXR 07/17/13 IMPRESSION:  Sequential pacemaker in place. Post CABG. Cardiomegaly.  Calcified tortuous aorta.  Mild central pulmonary vascular prominence stable.  Basilar scarring/subsegmental atelectasis without segmental  consolidation.  Electronically Signed  By: Chauncey Cruel M.D.  On: 07/17/2013 16:32  11/07/13- 80 year old male former smoker, Veteran,  History of asthma, COPD, obstructive sleep apnea complicated by DM, obesity, CAD/ischemic CM/sick sinus/bradycardia tachycardia/ pacemaker, history of prostate cancer, anemia. CPAP 12 / O2 2L/ Advanced sleep. Dyspnea on exertion with pulse oxygen, okay at rest. He worries about being allergic to bushes around his home because he has a "bitter taste" in his mouth. Neighbor upstairs smokes and the odor bothers patient. Continues CPAP all night every night and believes it helps him Walk test on oxygen- 11/07/13- 3 laps x 185 feet, lowest sat 91 % on 2L pulsed O2  01/18/14- 80 year old male former smoker, Veteran,  History of asthma, COPD, obstructive sleep apnea complicated by DM, obesity, CAD/ischemic CM/sick sinus/bradycardia tachycardia/ pacemaker,  history of prostate cancer, anemia.  CPAP 12 / O2 2L/  Advanced sleep. FOLLOWS FOR:  acute visit today.  sore throat x 2-3 weeks.  pt stated that when he uses his inhalers he is having burning in his chest and wheezing at times with the inhaler use.  Nonproductive cough. Feels congested. Using nebulizer and rescue inhaler but they seem to irritate his chest. Head of bed elevated and taking Nexium twice daily for reflux control.  ROS-see HPI Constitutional:   No-   weight loss, night sweats, fevers, chills, fatigue, lassitude. HEENT:   No-  headaches, difficulty swallowing, tooth/dental problems, sore throat,       No-  sneezing, itching, ear ache, nasal congestion, post nasal drip,  CV:  No- recent  chest pain, orthopnea, PND. +Mild chronic swelling in lower extremities,                   No- anasarca,   dizziness, palpitations Resp: +  shortness of breath with exertion or at rest.              No- productive cough,  + non-productive cough,  No- coughing up of blood.              No- change in color of mucus.  No- wheezing.   Skin: No-   rash or lesions. GI:  No-   heartburn, indigestion, abdominal pain, nausea, vomiting,  GU:  MS: . Limiting hip pain Neuro-     nothing unusual Psych:  No- change in mood or affect. No depression or anxiety.  No memory loss.  OBJ General- Alert, Oriented, Affect-appropriate, Distress- none acute, overweight, O2 2 L Skin- rash-none, lesions- none, excoriation- none Lymphadenopathy- none Head- atraumatic            Eyes- Gross vision intact, PERRLA, conjunctivae clear secretions            Ears- Hearing aids, HOH            Nose- Clear, no-Septal dev, mucus, polyps, erosion, perforation             Throat- Mallampati II , mucosa clear , drainage- none, tonsils- atrophic Neck- flexible , trachea midline, no stridor , thyroid nl, carotid no bruit Chest - symmetrical excursion , unlabored           Heart/CV- RRR +bigeminal pulse , no murmur , no gallop  , no rub, nl s1 s2                           - JVD- none ,  edema- none, stasis changes- none, varices- none           Lung- +few crackles, unlabored, wheeze- none, cough-none,                             dullness-none, rub- none.  Raspy laugh.           Chest wall- pacemaker left chest Abd- Br/ Gen/ Rectal- Not done, not indicated Extrem- cyanosis- none, clubbing, none, atrophy- none, strength- deconditioned,                    +cane Neuro- grossly intact to observation

## 2014-01-18 NOTE — Telephone Encounter (Signed)
Called spoke with pt. appt scheduled to see CDY at 2 PM today. Nothing further needed

## 2014-01-18 NOTE — Patient Instructions (Signed)
Sample Breo Ellipta inhaler   1 puff, then rinse mouth, one time daily     Try this insteado fo your nebulizer or Inhaler for a few days, to see if your chest feels any better.

## 2014-01-23 ENCOUNTER — Ambulatory Visit (INDEPENDENT_AMBULATORY_CARE_PROVIDER_SITE_OTHER): Payer: Medicare Other | Admitting: Internal Medicine

## 2014-01-23 ENCOUNTER — Encounter: Payer: Self-pay | Admitting: Internal Medicine

## 2014-01-23 VITALS — BP 140/56 | HR 66 | Temp 97.5°F | Ht 66.0 in | Wt 232.0 lb

## 2014-01-23 DIAGNOSIS — M545 Low back pain, unspecified: Secondary | ICD-10-CM

## 2014-01-23 DIAGNOSIS — Z23 Encounter for immunization: Secondary | ICD-10-CM

## 2014-01-23 DIAGNOSIS — I2510 Atherosclerotic heart disease of native coronary artery without angina pectoris: Secondary | ICD-10-CM | POA: Diagnosis not present

## 2014-01-23 DIAGNOSIS — I10 Essential (primary) hypertension: Secondary | ICD-10-CM

## 2014-01-23 DIAGNOSIS — J432 Centrilobular emphysema: Secondary | ICD-10-CM

## 2014-01-23 DIAGNOSIS — J438 Other emphysema: Secondary | ICD-10-CM | POA: Diagnosis not present

## 2014-01-23 DIAGNOSIS — E119 Type 2 diabetes mellitus without complications: Secondary | ICD-10-CM

## 2014-01-23 MED ORDER — ASPIRIN EC 81 MG PO TBEC: 81.0000 mg | DELAYED_RELEASE_TABLET | Freq: Every day | ORAL | Status: DC

## 2014-01-23 MED ORDER — PREDNISONE (PAK) 10 MG PO TABS: ORAL_TABLET | ORAL | Status: DC

## 2014-01-23 NOTE — Assessment & Plan Note (Signed)
"  diet controlled" no longer follows with endo (prev kerr, kumar) or the [place] actos caused edema/fluid retention and januvia stopped summer 2014 (?cost) Intolerant of metformin due to nausea and "hospitalization" patient advised on anticipated increase in cbgs due to steroid effect for next several days - pt will call if cbg>200  Lab Results  Component Value Date   HGBA1C 7.0* 12/20/2013

## 2014-01-23 NOTE — Progress Notes (Signed)
Subjective:    Patient ID: Eric [name], male    DOB: January 10, 1927, 80 y.o.   MRN: [id number]  HPI  Patient here for follow up Reviewed chronic medical issues and interval medical events  Past Medical History  Diagnosis Date  . LACTOSE INTOLERANCE   . OBESITY   . CARDIOMYOPATHY, ISCHEMIC   . AORTIC SCLEROSIS   . SICK SINUS/ TACHY-BRADY SYNDROME 09/2007    s/p PPM st judes  . PERIPHERAL VASCULAR DISEASE   . CAROTID BRUIT, RIGHT 02/27/2008  . IBS (irritable bowel syndrome)   . ALLERGIC RHINITIS   . ANEMIA-NOS   . OA (osteoarthritis)   . COPD   . GERD   . HYPERLIPIDEMIA   . HIATAL HERNIA   . Diverticulosis   . Prostate cancer     seed implants 2004  . DIABETES MELLITUS-TYPE II     diet controlled  . CORONARY ARTERY DISEASE     CABG 1995, PTCA/DES 2008, 2009 and 08/2010  . HYPERTENSION   . Asthma   . Sleep apnea   . Partial small bowel obstruction   . Primary hyperparathyroidism     Lab Results Component Value Date  PTH 150.7* 02/13/2013  CALCIUM 11.0* 02/13/2013  CAION 1.21 03/15/2008    . SMALL BOWEL OBSTRUCTION 04/18/2009    Qualifier: History of  By: Asa Lente MD, Jannifer Rodney Cataract     surgery    Review of Systems  Constitutional: Positive for fatigue. Negative for fever and unexpected weight change.  HENT: Positive for sore throat ("dry").   Respiratory: Negative for cough and shortness of breath.   Cardiovascular: Negative for chest pain and leg swelling.  Musculoskeletal: Positive for back pain (chronic -mild flare with weather change).  Psychiatric/Behavioral: Positive for dysphoric mood. Negative for sleep disturbance and decreased concentration.       Objective:   Physical Exam  BP 140/56  Pulse 66  Temp(Src) 97.5 F (36.4 C) (Oral)  Ht 5\' 6"  (1.676 m)  Wt 232 lb (105.235 kg)  BMI 37.46 kg/m2  SpO2 92% Wt Readings from Last 3 Encounters:  01/23/14 232 lb (105.235 kg)  01/18/14 235 lb 6.4 oz (106.777 kg)  12/20/13 240 lb (108.863 kg)    Constitutional: he is obese, appears well-developed and well-nourished. No distress.  HENT: or cephalic atraumatic. Oropharynx with mild erythema, no exudate, postnasal drip or vesicles Neck: Normal range of motion. Neck supple. No JVD present. No thyromegaly present.  Cardiovascular: Normal rate, regular rhythm and normal heart sounds.  No murmur heard. No BLE edema. Pulmonary/Chest: Effort normal and breath sounds diminished at bases bilaterally. No respiratory distress. he has no wheezes.  Psychiatric: he has a chronically dysphoric mood and affect. His behavior is normal. Judgment and thought content normal.   Lab Results  Component Value Date   WBC 4.4 12/20/2013   HGB 11.9* 12/20/2013   HCT 35.2* 12/20/2013   PLT 141.0* 12/20/2013   GLUCOSE 130* 12/20/2013   CHOL 110 04/12/2013   TRIG 120 04/12/2013   HDL 33* 04/12/2013   LDLCALC 53 04/12/2013   ALT 19 12/20/2013   AST 29 12/20/2013   NA 136 12/20/2013   K 4.6 12/20/2013   CL 103 12/20/2013   CREATININE 1.0 12/20/2013   BUN 12 12/20/2013   CO2 28 12/20/2013   TSH 1.33 04/12/2013   PSA 0.02* 09/10/2006   INR 1.00 08/13/2011   HGBA1C 7.0* 12/20/2013   MICROALBUR 0.8 09/30/2012    Ct Head  W Wo Contrast  11/29/2013   CLINICAL DATA:  Dysosmia, occasional bitter taste.  Blurry vision.  EXAM: CT HEAD WITHOUT AND WITH CONTRAST  TECHNIQUE: Contiguous axial images were obtained from the base of the skull through the vertex without and with intravenous contrast  CONTRAST:  60mL OMNIPAQUE IOHEXOL 300 MG/ML  SOLN  BUN and creatinine were obtained on site at [facility] at  [address]  Results:  BUN 11.0 mg/dL,  Creatinine 0.8 mg/dL.  COMPARISON:  Sinus CT 11/25/2004.  FINDINGS: No evidence for acute infarction, hemorrhage, mass lesion, hydrocephalus, or extra-axial fluid. Appropriate for age cerebral and cerebellar atrophy. Chronic microvascular ischemic change affects the periventricular and subcortical white matter. Post infusion, no  abnormal enhancement of the brain or meninges. Focal area of hypoattenuation right posterior frontal cortex and subcortical white matter likely remote cortical infarction.  Calvarium intact. No visible sinus or mastoid disease. Mild vascular calcification, not unexpected for age.  Within limits of evaluation, careful evaluation of the subfrontal region reveals no areas of concern which might represent a mass or abnormal enhancement. No destructive process is observed involving the cribriform plate, crista galli, posterior frontal sinus wall, or anterior ethmoid air cells. Incompletely visualized orbits show no gross asymmetry.  When correlated with the exam in 2006, the appearance is similar.  IMPRESSION: Normal for age cerebral volume. 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  No acute intracranial findings. No abnormal postcontrast enhancement. No visible subfrontal mass or inflammatory process.   Electronically Signed   By: Rolla Flatten M.D.   On: 11/29/2013 11:32       Assessment & Plan:   Problem List Items Addressed This Visit   COPD with emphysema - Primary     Recently started on inhaled steroid - not feeling that symptoms have improved Pt dislikes nebs "too strong" and feels  O2 causes "sore throat and dryness" despite humidified air at home Interval reviewed, no changes recommended  follow up pulm as ongoing    Relevant Medications      predniSONE (STERAPRED UNI-PAK) 10 MG tablet   DIABETES MELLITUS-TYPE II      "diet controlled" no longer follows with endo (prev kerr, kumar) or the [employer] caused edema/fluid retention and januvia stopped summer 2014 (?cost) Intolerant of metformin due to nausea and "hospitalization" patient advised on anticipated increase in cbgs due to steroid effect for next several days - pt will call if cbg>200  Lab Results  Component Value Date   HGBA1C 7.0* 12/20/2013      Relevant Medications      aspirin EC tablet    HYPERTENSION      BP Readings from Last 3 Encounters:  01/23/14 140/56  01/18/14 124/58  12/20/13 124/50   Hx orthostatic symptoms if overtreated -  Monitor and adjust/resume as needed based on BP and symptoms  The current medical regimen is effective;  continue present plan and medications.     Relevant Medications      aspirin EC tablet   LOW BACK PAIN, CHRONIC     History of surgical intervention July 2010 for same -  following with [facility] for same - ongoing care and ESI by Dr. Sherlene Shams prn associated with chronic right leg pain, unclear by history if hip or back etiology Also LLQ pain from spinal stenosis  follow up ortho  as planned pred pak for flare - erx done    Relevant Medications      predniSONE (  STERAPRED UNI-PAK) 10 MG tablet      aspirin EC tablet    Other Visit Diagnoses   Need for influenza vaccination        Relevant Orders       Flu Vaccine QUAD 36+ mos PF IM (Fluarix Quad PF)

## 2014-01-23 NOTE — Assessment & Plan Note (Signed)
BP Readings from Last 3 Encounters:  01/23/14 140/56  01/18/14 124/58  12/20/13 124/50   Hx orthostatic symptoms if overtreated -  Monitor and adjust/resume as needed based on BP and symptoms  The current medical regimen is effective;  continue present plan and medications.

## 2014-01-23 NOTE — Assessment & Plan Note (Signed)
History of surgical intervention July 2010 for same -  following with [facility] for same - ongoing care and ESI by Dr. Sherlene Shams prn associated with chronic right leg pain, unclear by history if hip or back etiology Also LLQ pain from spinal stenosis  follow up ortho  as planned pred pak for flare - erx done

## 2014-01-23 NOTE — Patient Instructions (Signed)
It was good to see you today.  We have reviewed your prior records including labs and tests today  Medications reviewed and updated Prednisone taper over next 6 days, no other changes recommended at this time.  Your prescription(s) have been submitted to your pharmacy. Please take as directed and contact our office if you believe you are having problem(s) with the medication(s).  Please schedule followup in 3-4 months, call sooner if problems.

## 2014-01-23 NOTE — Progress Notes (Signed)
Pre visit review using our clinic review tool, if applicable. No additional management support is needed unless otherwise documented below in the visit note. 

## 2014-01-23 NOTE — Assessment & Plan Note (Signed)
Recently started on inhaled steroid - not feeling that symptoms have improved Pt dislikes nebs "too strong" and feels [place] O2 causes "sore throat and dryness" despite humidified air at home Interval reviewed, no changes recommended  follow up pulm as ongoing

## 2014-01-24 ENCOUNTER — Telehealth: Payer: Self-pay | Admitting: Internal Medicine

## 2014-01-24 NOTE — Telephone Encounter (Signed)
Relevant patient education mailed to patient.  

## 2014-01-29 ENCOUNTER — Telehealth: Payer: Self-pay | Admitting: Internal Medicine

## 2014-01-29 DIAGNOSIS — B370 Candidal stomatitis: Secondary | ICD-10-CM | POA: Diagnosis not present

## 2014-01-29 NOTE — Telephone Encounter (Signed)
Shannon, Those sugars should not have caused him these sxs... Please advised him to get in touch with PCP ASAP or go to the hospital if they continue.

## 2014-01-29 NOTE — Telephone Encounter (Signed)
Called pt and he said that his bs has been 170 - 180's. He could barely make it down the hallway (where he lives) and back to his apt. He checked it bs it was 180. He is very dizzy and is afraid to even drive. Pt states that his bs was 140 this AM. He ate 1 egg, 2 slices of bacon and 1 piece of toast. He has not checked it since. He is very concerned. He lost his bs logs when moving to his new place. Please advise.

## 2014-01-29 NOTE — Telephone Encounter (Signed)
Pt's BS is so high he is very dizzy and can barely walk. Please advise ?appt to be made?

## 2014-01-29 NOTE — Telephone Encounter (Signed)
Called pt and lvm advising him per Dr Arman Filter note. Advised pt to call back with any questions.

## 2014-02-01 ENCOUNTER — Encounter: Payer: Self-pay | Admitting: Internal Medicine

## 2014-02-05 DIAGNOSIS — M5137 Other intervertebral disc degeneration, lumbosacral region: Secondary | ICD-10-CM | POA: Diagnosis not present

## 2014-02-05 DIAGNOSIS — M545 Low back pain, unspecified: Secondary | ICD-10-CM | POA: Diagnosis not present

## 2014-02-08 ENCOUNTER — Encounter: Payer: Self-pay | Admitting: Internal Medicine

## 2014-02-08 ENCOUNTER — Ambulatory Visit (INDEPENDENT_AMBULATORY_CARE_PROVIDER_SITE_OTHER): Payer: Medicare Other | Admitting: Internal Medicine

## 2014-02-08 VITALS — BP 118/62 | HR 72 | Temp 97.7°F | Wt 231.0 lb

## 2014-02-08 DIAGNOSIS — R634 Abnormal weight loss: Secondary | ICD-10-CM | POA: Diagnosis not present

## 2014-02-08 DIAGNOSIS — R5381 Other malaise: Secondary | ICD-10-CM | POA: Diagnosis not present

## 2014-02-08 DIAGNOSIS — R5383 Other fatigue: Secondary | ICD-10-CM | POA: Diagnosis not present

## 2014-02-08 DIAGNOSIS — R10819 Abdominal tenderness, unspecified site: Secondary | ICD-10-CM | POA: Diagnosis not present

## 2014-02-08 DIAGNOSIS — I2510 Atherosclerotic heart disease of native coronary artery without angina pectoris: Secondary | ICD-10-CM

## 2014-02-08 DIAGNOSIS — R531 Weakness: Secondary | ICD-10-CM | POA: Insufficient documentation

## 2014-02-08 DIAGNOSIS — R42 Dizziness and giddiness: Secondary | ICD-10-CM | POA: Diagnosis not present

## 2014-02-08 NOTE — Assessment & Plan Note (Signed)
Etiology unclear, former smoker 20 yrs ago, but for cxr as well

## 2014-02-08 NOTE — Assessment & Plan Note (Signed)
I suspect has some element of constipation, and maybe decreased vagal tone is part of his dizziness, encouraged miralax daily though he denies constipation

## 2014-02-08 NOTE — Progress Notes (Signed)
Subjective:    Patient ID: Eric [name], male    DOB: [birth year], 80 y.o.   MRN: [id number]  HPI Here with "so weak I can hardly get around". Wondering if BP too low.  Has an older BP machine at home, BP runs there in the 130's after AM meds, but not sure if accurate. Pt denies chest pain, increased sob or doe, wheezing, orthopnea, PND, increased LE swelling, palpitations, syncope, but did have one severe episode of dizziness at grocery, had to sit to feel better. (and several less episodes at home in the AM with first getting up).  Pt denies new neurological symptoms such as new headache, or facial or extremity weakness or numbness   Pt denies polydipsia, polyuria,  Pt denies fever,  night sweats, or other constitutional symptoms, but less appetitie, has lost wt from 254 peak wt to 231 today over 3-4 mo. Denies worsening reflux, abd pain, dysphagia, n/v, bowel change or blood.  Denies worsening depressive symptoms, suicidal ideation, or panic; has been active with the [employer] usually quite social, but now not caring much due to the weakness/dizzines. Had mild anemia by labs in July, but overall stable for many months. Former smoker - quit 1995.  Mentions also PSA has increased per DrWrenn recently, with plan for fu PSA at 6 mo instead of one yr, ? Recurrence of cancer.  CBG's in low 100's at homes without lower noted. Overall good compliance with treatment, including the plavix, and diuretic, diovan, imdur, and bumex.  Recently tx for thrush with Dr Johnnette Gourd, improved.  Has hx of postural lightheadedness noted on chart. Not on beta blocker with hx of CAD.  Pt declines labs or cxr today, but will return tomorrow., has another appt to go to. Past Medical History  Diagnosis Date  . LACTOSE INTOLERANCE   . OBESITY   . CARDIOMYOPATHY, ISCHEMIC   . AORTIC SCLEROSIS   . SICK SINUS/ TACHY-BRADY SYNDROME 09/2007    s/p PPM st judes  . PERIPHERAL VASCULAR DISEASE   . CAROTID BRUIT, RIGHT 02/27/2008    . IBS (irritable bowel syndrome)   . ALLERGIC RHINITIS   . ANEMIA-NOS   . OA (osteoarthritis)   . COPD   . GERD   . HYPERLIPIDEMIA   . HIATAL HERNIA   . Diverticulosis   . Prostate cancer     seed implants 2004  . DIABETES MELLITUS-TYPE II     diet controlled  . CORONARY ARTERY DISEASE     CABG 1995, PTCA/DES 2008, 2009 and 08/2010  . HYPERTENSION   . Asthma   . Sleep apnea   . Partial small bowel obstruction   . Primary hyperparathyroidism     Lab Results Component Value Date  PTH 150.7* 02/13/2013  CALCIUM 11.0* 02/13/2013  CAION 1.21 03/15/2008    . SMALL BOWEL OBSTRUCTION 04/18/2009    Qualifier: History of  By: Asa Lente MD, Jannifer Rodney Cataract     surgery   Past Surgical History  Procedure Laterality Date  . Ptca  2008, 2009, 2012    with DES  . Partial small bowel obstruction  2009  . Bilateral cataracts    . Pacemaker insertion      DDD/[employer]         Last interrogation 2/13  on chart     Pacemaker guideline order Dr Tamala Julian on chart  . Inguinal hernia repair Bilateral   . Lumbar disc surgery  12/2008  . Coronary  artery bypass graft    . Total hip arthroplasty  08/21/2011    Procedure: TOTAL HIP ARTHROPLASTY;  Surgeon: Johnn Hai, MD;  Location: [facility];  Service: Orthopedics;  Laterality: Right;  . Colonoscopy    . Esophagogastroduodenoscopy    . Penile prosthesis placement    . Back surgery    . Colon surgery    . Flexible sigmoidoscopy N/A 09/22/2013    Procedure: FLEXIBLE SIGMOIDOSCOPY;  Surgeon: Gatha Mayer, MD;  Location: [facility];  Service: Endoscopy;  Laterality: N/A;    reports that he quit smoking about 18 years ago. He has never used smokeless tobacco. He reports that he does not drink alcohol or use illicit drugs. family history includes Hypertension in his mother. There is no history of Colon cancer. Allergies  Allergen Reactions  . Actos [Pioglitazone Hydrochloride] Other (See Comments)    "felt funny, drowsy, and weak":  .  Celebrex [Celecoxib] Other (See Comments)    "felt funny"  . Demerol Palpitations and Other (See Comments)    Increased BP  . Morphine And Related Nausea And Vomiting  . Ciprofloxacin Other (See Comments)    arthralgia  . Metformin Nausea And Vomiting  . Zocor [Simvastatin] Other (See Comments)    Makes pt very drowsy   Current Outpatient Prescriptions on File Prior to Visit  Medication Sig Dispense Refill  . albuterol (PROVENTIL HFA) 108 (90 BASE) MCG/ACT inhaler Inhale 2 puffs into the lungs every 4 (four) hours as needed. Wheezing       . albuterol (PROVENTIL) (2.5 MG/3ML) 0.083% nebulizer solution Take 3 mLs (2.5 mg total) by nebulization every 4 (four) hours as needed (Dx: COPD 496). Wheezing  75 mL  5  . aluminum & magnesium hydroxide (MAALOX) 225-200 MG/5ML suspension Take 15 mLs by mouth every 6 (six) hours as needed. Heart burn       . aspirin EC 81 MG tablet Take 1 tablet (81 mg total) by mouth daily.  150 tablet  2  . bumetanide (BUMEX) 1 MG tablet Take 1 mg by mouth daily after breakfast.       . clopidogrel (PLAVIX) 75 MG tablet Take 75 mg by mouth daily after breakfast.       . esomeprazole (NEXIUM) 40 MG capsule Take 40 mg by mouth 2 (two) times daily.      . fluticasone (FLONASE) 50 MCG/ACT nasal spray Place 2 sprays into the nose daily. Allergies  16 g  1  . HYDROcodone-acetaminophen (NORCO) 7.5-325 MG per tablet Take 1-2 tablets by mouth every 4 (four) hours as needed.      . isosorbide mononitrate (IMDUR) 60 MG 24 hr tablet Take 1 tablet (60 mg total) by mouth every evening.      . Niacin CR 1000 MG TBCR Take 1,000 mg by mouth at bedtime.       . nitroGLYCERIN (NITROSTAT) 0.4 MG SL tablet Place 0.4 mg under the tongue every 5 (five) minutes as needed. Chest pain       . Polyethyl Glycol-Propyl Glycol (SYSTANE) 0.4-0.3 % SOLN Apply 2 drops to eye at bedtime.      . polyethylene glycol (MIRALAX / GLYCOLAX) packet Take 17 g by mouth daily as needed. Constipation       .  pravastatin (PRAVACHOL) 20 MG tablet Take 60 mg by mouth at bedtime.       . predniSONE (STERAPRED UNI-PAK) 10 MG tablet Take by mouth as directed. As directed x 6 days  21  tablet  0  . senna (SENOKOT) 8.6 MG tablet Alternates with one tablet one day then two tablets the next day      . valsartan (DIOVAN) 160 MG tablet Take 1 tablet (160 mg total) by mouth 2 (two) times daily.       No current facility-administered medications on file prior to visit.   Review of Systems  Constitutional: Negative for unusual diaphoresis or other sweats  HENT: Negative for ringing in ear Eyes: Negative for double vision or worsening visual disturbance.  Respiratory: Negative for choking and stridor.   Gastrointestinal: Negative for vomiting or other signifcant bowel change Genitourinary: Negative for hematuria or decreased urine volume.  Musculoskeletal: Negative for other MSK pain or swelling Skin: Negative for color change and worsening wound.  Neurological: Negative for tremors and numbness other than noted  Psychiatric/Behavioral: Negative for decreased concentration or agitation other than above       Objective:   Physical Exam BP 112/62  Pulse 72  Temp(Src) 97.7 F (36.5 C) (Oral)  Wt 231 lb (104.781 kg)  SpO2 95% VS noted,  Orthostatic VS noted Constitutional: Pt appears well-developed, well-nourished.  HENT: Head: NCAT.  Right Ear: External ear normal.  Left Ear: External ear normal.  Eyes: . Pupils are equal, round, and reactive to light. Conjunctivae and EOM are normal Neck: Normal range of motion. Neck supple.  Cardiovascular: Normal rate and regular rhythm.   Pulmonary/Chest: Effort normal and breath sounds normal.  Abd:  Soft, ND, + BS, with bilat lower abd tender left > right without guarding or rebound Neurological: Pt is alert. Not confused , motor grossly intact, gait somewhat unsteady Skin: Skin is warm. No rash Psychiatric: Pt behavior is normal. No agitation.     Assessment  & Plan:

## 2014-02-08 NOTE — Progress Notes (Signed)
Pre visit review using our clinic review tool, if applicable. No additional management support is needed unless otherwise documented below in the visit note. 

## 2014-02-08 NOTE — Assessment & Plan Note (Addendum)
ECG reviewed as per emr, diff includes volume depletion, antiHTN med overtx, and/or neurological postural issue; most likely volume deplete and BP med - to d/c bumex, and diovan for now, f/u 1 wk, avoid elevated heights, consider not driving  Note:  Total time for pt hx, exam, review of record with pt in the room, determination of diagnoses and plan for further eval and tx is > 40 min, with over 50% spent in coordination and counseling of patient

## 2014-02-08 NOTE — Patient Instructions (Signed)
OK to stop your fluid pill (bumex) for now  OK to stop the diovan for now as well  Please go to the LAB in the Basement (turn left off the elevator) for the tests to be done at your convenience  Please go to the XRAY Department in the Basement (go straight as you get off the elevator) for the x-ray testing  You will be contacted by phone if any changes need to be made immediately.  Otherwise, you will receive a letter about your results with an explanation, but please check with MyChart first.  Please remember to sign up for MyChart if you have not done so, as this will be important to you in the future with finding out test results, communicating by private email, and scheduling acute appointments online when needed.  Please return in 1 week, or sooner if needed, with Dr Asa Lente, or myself

## 2014-02-08 NOTE — Assessment & Plan Note (Signed)
Most likely related to his postural issue, but will check labs as well

## 2014-02-09 ENCOUNTER — Ambulatory Visit (INDEPENDENT_AMBULATORY_CARE_PROVIDER_SITE_OTHER)
Admission: RE | Admit: 2014-02-09 | Discharge: 2014-02-09 | Disposition: A | Discharge status: Home / Self Care | Payer: Medicare Other | Source: Ambulatory Visit | Attending: Internal Medicine | Admitting: Internal Medicine

## 2014-02-09 ENCOUNTER — Other Ambulatory Visit (INDEPENDENT_AMBULATORY_CARE_PROVIDER_SITE_OTHER): Payer: Medicare Other

## 2014-02-09 ENCOUNTER — Encounter: Payer: Self-pay | Admitting: Internal Medicine

## 2014-02-09 DIAGNOSIS — Z87891 Personal history of nicotine dependence: Secondary | ICD-10-CM | POA: Diagnosis not present

## 2014-02-09 DIAGNOSIS — I10 Essential (primary) hypertension: Secondary | ICD-10-CM | POA: Diagnosis not present

## 2014-02-09 DIAGNOSIS — R0602 Shortness of breath: Secondary | ICD-10-CM | POA: Diagnosis not present

## 2014-02-09 DIAGNOSIS — R634 Abnormal weight loss: Secondary | ICD-10-CM | POA: Diagnosis not present

## 2014-02-09 DIAGNOSIS — R5381 Other malaise: Secondary | ICD-10-CM | POA: Diagnosis not present

## 2014-02-09 DIAGNOSIS — J45909 Unspecified asthma, uncomplicated: Secondary | ICD-10-CM | POA: Diagnosis not present

## 2014-02-09 DIAGNOSIS — R5383 Other fatigue: Secondary | ICD-10-CM | POA: Diagnosis not present

## 2014-02-09 DIAGNOSIS — R531 Weakness: Secondary | ICD-10-CM

## 2014-02-09 LAB — CBC WITH DIFFERENTIAL/PLATELET
BASOS PCT: 0.2 % (ref 0.0–3.0)
Basophils Absolute: 0 10*3/uL (ref 0.0–0.1)
EOS PCT: 2.3 % (ref 0.0–5.0)
Eosinophils Absolute: 0.1 10*3/uL (ref 0.0–0.7)
HEMATOCRIT: 37.4 % — AB (ref 39.0–52.0)
Hemoglobin: 12.6 g/dL — ABNORMAL LOW (ref 13.0–17.0)
LYMPHS ABS: 0.5 10*3/uL — AB (ref 0.7–4.0)
Lymphocytes Relative: 9.6 % — ABNORMAL LOW (ref 12.0–46.0)
MCHC: 33.7 g/dL (ref 30.0–36.0)
MCV: 99.9 fl (ref 78.0–100.0)
Monocytes Absolute: 0.6 10*3/uL (ref 0.1–1.0)
Monocytes Relative: 13.2 % — ABNORMAL HIGH (ref 3.0–12.0)
Neutro Abs: 3.5 10*3/uL (ref 1.4–7.7)
Neutrophils Relative %: 74.7 % (ref 43.0–77.0)
PLATELETS: 126 10*3/uL — AB (ref 150.0–400.0)
RBC: 3.74 Mil/uL — AB (ref 4.22–5.81)
RDW: 12.9 % (ref 11.5–15.5)
WBC: 4.7 10*3/uL (ref 4.0–10.5)

## 2014-02-09 LAB — URINALYSIS, ROUTINE W REFLEX MICROSCOPIC
Bilirubin Urine: NEGATIVE
Hgb urine dipstick: NEGATIVE
KETONES UR: NEGATIVE
LEUKOCYTES UA: NEGATIVE
Nitrite: NEGATIVE
PH: 5 (ref 5.0–8.0)
SPECIFIC GRAVITY, URINE: 1.02 (ref 1.000–1.030)
TOTAL PROTEIN, URINE-UPE24: NEGATIVE
Urine Glucose: NEGATIVE
Urobilinogen, UA: 0.2 (ref 0.0–1.0)

## 2014-02-09 LAB — BASIC METABOLIC PANEL
BUN: 15 mg/dL (ref 6–23)
CALCIUM: 10.7 mg/dL — AB (ref 8.4–10.5)
CO2: 23 mEq/L (ref 19–32)
Chloride: 106 mEq/L (ref 96–112)
Creatinine, Ser: 0.9 mg/dL (ref 0.4–1.5)
GFR: 84.19 mL/min (ref >60.00)
Glucose, Bld: 116 mg/dL — ABNORMAL HIGH (ref 70–99)
Potassium: 4.2 mEq/L (ref 3.5–5.1)
SODIUM: 135 meq/L (ref 135–145)

## 2014-02-09 LAB — HEPATIC FUNCTION PANEL
ALT: 25 U/L (ref 0–53)
AST: 33 U/L (ref 0–37)
Albumin: 4 g/dL (ref 3.5–5.2)
Alkaline Phosphatase: 59 U/L (ref 39–117)
BILIRUBIN TOTAL: 0.9 mg/dL (ref 0.2–1.2)
Bilirubin, Direct: 0.2 mg/dL (ref 0.0–0.3)
Total Protein: 7 g/dL (ref 6.0–8.3)

## 2014-02-09 LAB — TSH: TSH: 1.45 u[IU]/mL (ref 0.35–4.50)

## 2014-02-13 ENCOUNTER — Encounter: Payer: Self-pay | Admitting: Internal Medicine

## 2014-02-13 ENCOUNTER — Telehealth: Payer: Self-pay | Admitting: Internal Medicine

## 2014-02-13 ENCOUNTER — Ambulatory Visit (INDEPENDENT_AMBULATORY_CARE_PROVIDER_SITE_OTHER): Payer: Medicare Other | Admitting: Internal Medicine

## 2014-02-13 VITALS — BP 138/62 | HR 64 | Temp 97.9°F | Resp 20 | Ht 66.0 in | Wt 235.2 lb

## 2014-02-13 DIAGNOSIS — I10 Essential (primary) hypertension: Secondary | ICD-10-CM

## 2014-02-13 DIAGNOSIS — I2510 Atherosclerotic heart disease of native coronary artery without angina pectoris: Secondary | ICD-10-CM

## 2014-02-13 DIAGNOSIS — E119 Type 2 diabetes mellitus without complications: Secondary | ICD-10-CM | POA: Diagnosis not present

## 2014-02-13 DIAGNOSIS — R42 Dizziness and giddiness: Secondary | ICD-10-CM

## 2014-02-13 NOTE — Progress Notes (Signed)
Pre visit review using our clinic review tool, if applicable. No additional management support is needed unless otherwise documented below in the visit note. 

## 2014-02-13 NOTE — Progress Notes (Signed)
   Subjective:    Patient ID: Eric [name], male    DOB: 01-20-1927, 80 y.o.   MRN: [id number]  HPI The patient is an 80 YO man who is coming in today for a blood pressure check. He was seen last week for dizziness and two of his blood pressure medications were stopped. Since that time he has felt as though his pressures are elevating and this is weighing on his mind. He does not know why they were stopped. He feels as though his dizziness is the same and he needs to stand for a few seconds prior to walking. He has not fallen recently. He has diet controlled diabetes although he has not been on medication. He has not had any other changes to his mediation. He is not getting up to urinate in the night since stopping his fluid pill. He resumed taking his diovan this weekend as he felt his pressures were getting too high. He denies any fevers, chills, abdominal pain, chest pain. He denies any headache, confusion, nausea, vomiting. He is feeling less tired than he was last week and wants to know about his lab results. He does have a pacemaker due to low heart rate years ago.  Review of Systems  Constitutional: Negative for fever, chills, activity change, appetite change and fatigue.  Respiratory: Negative for cough, chest tightness, shortness of breath and wheezing.   Cardiovascular: Negative for chest pain, palpitations and leg swelling.  Gastrointestinal: Negative for nausea, vomiting, abdominal pain and abdominal distention.  Genitourinary: Negative for enuresis and difficulty urinating.  Neurological: Positive for dizziness. Negative for syncope, weakness, light-headedness and headaches.  Psychiatric/Behavioral: Negative for confusion.       Objective:   Physical Exam  Nursing note and vitals reviewed. Constitutional: He appears well-developed and well-nourished.  HENT:  Head: Normocephalic and atraumatic.  Eyes: EOM are normal.  Neck: Normal range of motion. Neck supple. No JVD present.   Cardiovascular: Normal rate and regular rhythm.   paced  Pulmonary/Chest: Effort normal and breath sounds normal. No respiratory distress. He has no wheezes. He has no rales. He exhibits no tenderness.  Abdominal: Soft. Bowel sounds are normal. He exhibits no distension. There is no tenderness. There is no rebound.   Filed Vitals:   02/13/14 1503  BP: 138/62  Pulse: 64  Temp: 97.9 F (36.6 C)  TempSrc: Oral  Resp: 20  Height: 5\' 6"  (1.676 m)  Weight: 235 lb 3.2 oz (106.686 kg)  SpO2: 94%       Assessment & Plan:

## 2014-02-13 NOTE — Assessment & Plan Note (Signed)
Do not feel that his dizziness is coming from complication of the diabetes as he does not have neuropathy or nephropathy.

## 2014-02-13 NOTE — Patient Instructions (Signed)
You can continue taking the diovan. Come back in about 2 weeks to check on your blood pressure. Be careful when you stand up. You need to give yourself time for the dizziness to stop before you start walking.   Work on exercising some to help your blood pressure and sugars stay good.   Exercise to Stay Healthy Exercise helps you become and stay healthy. EXERCISE IDEAS AND TIPS Choose exercises that:  You enjoy.  Fit into your day. You do not need to exercise really hard to be healthy. You can do exercises at a slow or medium level and stay healthy. You can:  Stretch before and after working out.  Try yoga, Pilates, or tai chi.  Lift weights.  Walk fast, swim, jog, run, climb stairs, bicycle, dance, or rollerskate.  Take aerobic classes. Exercises that burn about 150 calories:  Running 1  miles in 15 minutes.  Playing volleyball for 45 to 60 minutes.  Washing and waxing a car for 45 to 60 minutes.  Playing touch football for 45 minutes.  Walking 1  miles in 35 minutes.  Pushing a stroller 1  miles in 30 minutes.  Playing basketball for 30 minutes.  Raking leaves for 30 minutes.  Bicycling 5 miles in 30 minutes.  Walking 2 miles in 30 minutes.  Dancing for 30 minutes.  Shoveling snow for 15 minutes.  Swimming laps for 20 minutes.  Walking up stairs for 15 minutes.  Bicycling 4 miles in 15 minutes.  Gardening for 30 to 45 minutes.  Jumping rope for 15 minutes.  Washing windows or floors for 45 to 60 minutes. Document Released: 06/27/2010 Document Revised: 08/17/2011 Document Reviewed: 06/27/2010 [facility] Patient Information 2015 [place], [place]. This information is not intended to replace advice given to you by your health care provider. Make sure you discuss any questions you have with your health care provider.

## 2014-02-13 NOTE — Telephone Encounter (Signed)
Patient Information:  Caller Name: [place]  Phone: [phone]  Patient: , Eric [name]  Gender: Male  DOB: 01-21-1927  Age: 80 Years  PCP: [name] (Adults only)  Office Follow Up:  Does the office need to follow up with this patient?: No  Instructions For The Office: N/A   Symptoms  Reason For Call & Symptoms: Calling to see if he can be seen in the office for elevated BP. He was last seen on 02/08/14 and taken off of Diovan after BP was low and he was feeling weak and dizzy.  BP =145-160/55-60 since Saturday 9/5/150 and he doesn't feel well. He put himself back on Diovan 160 mgs 1 PO QD but thinks he may need to take it twice today. He has pressure in his eyes and blurry vision. He has noticed that BP is higher with standing.   Reviewed Health History In EMR: Yes  Reviewed Medications In EMR: Yes  Reviewed Allergies In EMR: Yes  Reviewed Surgeries / Procedures: Yes  Date of Onset of Symptoms: 02/10/2014  Guideline(s) Used:  High Blood Pressure  Disposition Per Guideline:   See Today in Office  Reason For Disposition Reached:   Patient wants to be seen  Advice Given:  General:  Untreated high blood pressure may cause damage to the heart, brain, kidneys, and eyes.  Treatment of high blood pressure can reduce the risk of stroke, heart attack, and heart failure.  The goal of blood pressure treatment for most patients with hypertension is to keep the blood pressure under 140/90.  Lifestyle Changes  Maintain a healthy weight. Lose weight if you are overweight.  Eat a diet high in fresh fruits and low-fat dairy products. Limit your intake of saturated and total fat. Choose foods that are lower in salt.  If you smoke, you should stop.  If you drink alcohol, you should limit your daily alcohol drinking. Women should have no more than one drink per day. Men should have no more than 2 drinks per day. A drink is defined as 1.5 oz hard liquor (one shot or jigger; 45 ml), 5 oz wine  (small glass; 150 ml), or 12 oz beer (one can; 360 ml).  Call Back If:  Headache, blurred vision, difficulty talking, or difficulty walking occurs  Chest pain or difficulty breathing occurs  You want to go in to the office for a blood pressure check  You become worse.  Limit:  Limit your sugar intake. Avoid high-calorie, low-nutrition foods like soft drinks and candy.  Limit foods high in saturated fat, trans-fat and cholesterol.  Limit alcoholic beverages to no more than one drink per day for women and no more than two drinks for men.  Call Back If:  You have more questions.  Patient Will Follow Care Advice:  YES  Appointment Scheduled:  02/13/2014 15:00:00 Appointment Scheduled Provider:  Olga Millers.

## 2014-02-13 NOTE — Assessment & Plan Note (Signed)
Patient is still taking his imdur and has resumed his diovan 160 mg BID. He did stop bumex as advised last week and has not resumed. Would keep him off bumex indefinitely. If needed can decrease dose of diovan at next visit. Advised him to return in 2 weeks for BP check. He is getting another BP meter from the store as he does not think his is working. He told me his cuff is tight and advised him he may need a larger cuff to get an accurate reading. Labs normal at last visit.

## 2014-02-13 NOTE — Assessment & Plan Note (Addendum)
Unclear if this is related to his medications. He is still having some symptoms. He is standing for about 1 minute before the dizziness clears and he is able to walk. He denies falls. BP is good today and not high or low. HR in the 60s so pacer seems to be functioning well. He states that he does not use his vicodin and then he mentions that he does use it for his back. Would not escalate dosing on that.

## 2014-02-15 ENCOUNTER — Telehealth: Payer: Self-pay | Admitting: Interventional Cardiology

## 2014-02-15 ENCOUNTER — Ambulatory Visit: Payer: Medicare Other | Admitting: Internal Medicine

## 2014-02-15 NOTE — Telephone Encounter (Signed)
New problem   Pt stated he is so dizzy he can't hardly walk and want to speak to nurse. Please call pt.

## 2014-02-15 NOTE — Telephone Encounter (Signed)
returned pt call. pt sts that he has been having episodes of dizziness for the last couple of wks.pt denies syncope,near syncope, sob,swelling, chest pain. Pt sts that he does have a burning sensation in the middle of his chest that comes and goes, pt is currently taking Nexium for acid reflux pt was seen by his pcp office on 9/8. pt was having low bp reading and his bp meds were held. pt resumed taking his bp meds this wk.pt sts that his systolic bp has been "all over the place". Pt has been anxious when bp is elevated.pt has checking his bp before he takes his bp medications.adv pt he should wait 1-2 hrs after taking meds to ck bp. Pt symptoms seem to non cardiac. Adv pt to f/u with pcp in regards to his dizziness, and since they have been managing his bp. Pt verbalized understanding.

## 2014-02-19 ENCOUNTER — Telehealth: Payer: Self-pay | Admitting: Interventional Cardiology

## 2014-02-19 DIAGNOSIS — H02059 Trichiasis without entropian unspecified eye, unspecified eyelid: Secondary | ICD-10-CM | POA: Diagnosis not present

## 2014-02-19 DIAGNOSIS — H16109 Unspecified superficial keratitis, unspecified eye: Secondary | ICD-10-CM | POA: Diagnosis not present

## 2014-02-19 DIAGNOSIS — H579 Unspecified disorder of eye and adnexa: Secondary | ICD-10-CM | POA: Diagnosis not present

## 2014-02-19 NOTE — Telephone Encounter (Signed)
New message  Pt called reports his bp is unstable (No readings) chest pain and Dizziness. Pt reports PCP advised to stop taking valsartin but when he stop his BP rises into the 190"s. Made appt to see PA on 02/23/2014 @ 11:10am. Please call back to discuss

## 2014-02-19 NOTE — Telephone Encounter (Signed)
Returned pt call. Pt is scheduled to to see S.Weaver,PA on 9/18. Pt is concerned about elevated bp.pt complains of a burning in the center of chest that last a couple if minutes and goes away.pt denies swelling,sob, nauseu Pt bp today was 170/75. Pt is very anxious and measures his bp several times daily. Pt  sts that when he wakes up in the morning he is dizzy for the first couple of minutes. pt denies syncope, near syncope.adv pt to measure bp 1 x daily and bring reading and bp machine to his o/v on Friday. Pt verbalized understanding.

## 2014-02-20 DIAGNOSIS — H6120 Impacted cerumen, unspecified ear: Secondary | ICD-10-CM | POA: Diagnosis not present

## 2014-02-20 DIAGNOSIS — H7200 Central perforation of tympanic membrane, unspecified ear: Secondary | ICD-10-CM | POA: Diagnosis not present

## 2014-02-20 DIAGNOSIS — H61009 Unspecified perichondritis of external ear, unspecified ear: Secondary | ICD-10-CM | POA: Diagnosis not present

## 2014-02-20 DIAGNOSIS — H905 Unspecified sensorineural hearing loss: Secondary | ICD-10-CM | POA: Diagnosis not present

## 2014-02-23 ENCOUNTER — Ambulatory Visit (INDEPENDENT_AMBULATORY_CARE_PROVIDER_SITE_OTHER): Payer: Medicare Other | Admitting: Physician Assistant

## 2014-02-23 ENCOUNTER — Encounter: Payer: Self-pay | Admitting: Physician Assistant

## 2014-02-23 ENCOUNTER — Telehealth: Payer: Self-pay | Admitting: *Deleted

## 2014-02-23 VITALS — BP 160/78 | HR 67 | Ht 66.0 in | Wt 234.0 lb

## 2014-02-23 DIAGNOSIS — I10 Essential (primary) hypertension: Secondary | ICD-10-CM | POA: Diagnosis not present

## 2014-02-23 DIAGNOSIS — R079 Chest pain, unspecified: Secondary | ICD-10-CM | POA: Diagnosis not present

## 2014-02-23 DIAGNOSIS — E785 Hyperlipidemia, unspecified: Secondary | ICD-10-CM

## 2014-02-23 DIAGNOSIS — I495 Sick sinus syndrome: Secondary | ICD-10-CM

## 2014-02-23 DIAGNOSIS — R0602 Shortness of breath: Secondary | ICD-10-CM | POA: Diagnosis not present

## 2014-02-23 DIAGNOSIS — Z950 Presence of cardiac pacemaker: Secondary | ICD-10-CM

## 2014-02-23 DIAGNOSIS — I2510 Atherosclerotic heart disease of native coronary artery without angina pectoris: Secondary | ICD-10-CM

## 2014-02-23 DIAGNOSIS — I6529 Occlusion and stenosis of unspecified carotid artery: Secondary | ICD-10-CM

## 2014-02-23 LAB — BRAIN NATRIURETIC PEPTIDE: Pro B Natriuretic peptide (BNP): 35 pg/mL (ref 0.0–100.0)

## 2014-02-23 MED ORDER — ISOSORBIDE MONONITRATE ER 30 MG PO TB24: 30.0000 mg | ORAL_TABLET | Freq: Every evening | ORAL | Status: DC

## 2014-02-23 MED ORDER — VALSARTAN 160 MG PO TABS: 160.0000 mg | ORAL_TABLET | Freq: Two times a day (BID) | ORAL | Status: DC

## 2014-02-23 NOTE — Patient Instructions (Signed)
Your physician has recommended you make the following change in your medication:  1. DECREASE IMDUR TO 30 MG DAILY; NEW RX SENT IN TODAY  LAB WORK TODAY; BNP  Your physician has requested that you have an echocardiogram. Echocardiography is a painless test that uses sound waves to create images of your heart. It provides your doctor with information about the size and shape of your heart and how well your heart's chambers and valves are working. This procedure takes approximately one hour. There are no restrictions for this procedure.  Your physician recommends that you schedule a follow-up appointment in: [place]. Tamala Julian

## 2014-02-23 NOTE — Telephone Encounter (Signed)
pt notified about lab results with verbal understanding  

## 2014-02-23 NOTE — Progress Notes (Signed)
Cardiology Office Note    Date:  02/23/2014   ID:  Eric [name], DOB 23-Jan-1927, MRN [id number]  PCP:  [name], MD  Cardiologist:  Dr. Daneen Schick   Electrophysiologist:  Dr. [name]    History of Present Illness: Eric [name] is a 80 y.o. male with a hx of CAD s/p CABG and subsequent stenting to S-Dx and S-OM1/OM2, SSS s/p PPM, HTN, HL, GERD, COPD, DM2, prostate CA.  Patient admitted with chest pain in 2/15 and myoview was low risk without ischemia.  Last seen by Dr. Daneen Schick in 08/2013.  He was having trouble with gallstones at that time.  Patient called in recently with chest pain and labile BP.  He returns for evaluation.    He was seen in primary care over the last couple of weeks with lightheadedness.  His Diovan was stopped as well as his Bumex.  The patient continued to note dizziness and near syncope.  He was having to cut Imdur 120 in 1/2 to get 60 mg.  He stopped taking this and started to feel much better.  His BP is somewhat labile.  He brings in a list of readings today that have been optimal since stopping the Imdur.  He does describe some chest pain. He has right-sided discomfort. He has a burning sensation. He also notes discomfort with eating. He tells me that his breathing is worse over time. He is probably NYHA class IIb. He denies orthopnea, PND or edema. He denies syncope. He denies any radiating symptoms, nausea or diaphoresis associated with his chest pain.   Studies:  - LHC (3/12):  RCA, RI, OM occluded, LAD 80% ostial, LM diff 40-60%, S-Dx stent patent, S-RCA patent, L-LAD patent, S-CFX with diffuse disease >>> PCI: Promus DES to mid S-OM1/OM2  - Nuclear (2/15):  Inf-lat infarct, no ischemia, EF 64% - Low Risk  - Carotid US (11/14):  L < 60%, R < 50% - repeat 1 year   Recent Labs/Images: 04/12/2013: HDL Cholesterol by NMR 33*; LDL (calc) 53  02/09/2014: ALT 25; Creatinine 0.9; Hemoglobin 12.6*; Potassium 4.2; TSH 1.45   Dg Chest 2 View   02/09/2014     IMPRESSION: Stable chronic findings with no change from 07/17/2013.   Electronically Signed   By: Skipper Cliche M.D.   On: 02/09/2014 10:01     Wt Readings from Last 3 Encounters:  02/23/14 234 lb (106.142 kg)  02/13/14 235 lb 3.2 oz (106.686 kg)  02/08/14 231 lb (104.781 kg)     Past Medical History  Diagnosis Date  . LACTOSE INTOLERANCE   . OBESITY   . CARDIOMYOPATHY, ISCHEMIC   . AORTIC SCLEROSIS   . SICK SINUS/ TACHY-BRADY SYNDROME 09/2007    s/p PPM st judes  . PERIPHERAL VASCULAR DISEASE   . CAROTID BRUIT, RIGHT 02/27/2008  . IBS (irritable bowel syndrome)   . ALLERGIC RHINITIS   . ANEMIA-NOS   . OA (osteoarthritis)   . COPD   . GERD   . HYPERLIPIDEMIA   . HIATAL HERNIA   . Diverticulosis   . Prostate cancer     seed implants 2004  . DIABETES MELLITUS-TYPE II     diet controlled  . CORONARY ARTERY DISEASE     CABG 1995, PTCA/DES 2008, 2009 and 08/2010  . HYPERTENSION   . Asthma   . Sleep apnea   . Partial small bowel obstruction   . Primary hyperparathyroidism     Lab Results Component Value  Date  PTH 150.7* 02/13/2013  CALCIUM 11.0* 02/13/2013  CAION 1.21 03/15/2008    . SMALL BOWEL OBSTRUCTION 04/18/2009    Qualifier: History of  By: Asa Lente MD, Jannifer Rodney Cataract     surgery    Current Outpatient Prescriptions  Medication Sig Dispense Refill  . albuterol (PROVENTIL HFA) 108 (90 BASE) MCG/ACT inhaler Inhale 2 puffs into the lungs every 4 (four) hours as needed. Wheezing       . albuterol (PROVENTIL) (2.5 MG/3ML) 0.083% nebulizer solution Take 3 mLs (2.5 mg total) by nebulization every 4 (four) hours as needed (Dx: COPD 496). Wheezing  75 mL  5  . aluminum & magnesium hydroxide (MAALOX) 225-200 MG/5ML suspension Take 15 mLs by mouth every 6 (six) hours as needed. Heart burn       . aspirin EC 81 MG tablet Take 1 tablet (81 mg total) by mouth daily.  150 tablet  2  . clopidogrel (PLAVIX) 75 MG tablet Take 75 mg by mouth daily after breakfast.        . esomeprazole (NEXIUM) 40 MG capsule Take 40 mg by mouth 2 (two) times daily.      . fluticasone (FLONASE) 50 MCG/ACT nasal spray Place 2 sprays into the nose daily. Allergies  16 g  1  . HYDROcodone-acetaminophen (NORCO) 7.5-325 MG per tablet Take 1-2 tablets by mouth every 4 (four) hours as needed.      . isosorbide mononitrate (IMDUR) 60 MG 24 hr tablet Take 1 tablet (60 mg total) by mouth every evening.      . Niacin CR 1000 MG TBCR Take 1,000 mg by mouth at bedtime.       . nitroGLYCERIN (NITROSTAT) 0.4 MG SL tablet Place 0.4 mg under the tongue every 5 (five) minutes as needed. Chest pain       . Polyethyl Glycol-Propyl Glycol (SYSTANE) 0.4-0.3 % SOLN Apply 2 drops to eye at bedtime.      . polyethylene glycol (MIRALAX / GLYCOLAX) packet Take 17 g by mouth daily as needed. Constipation       . pravastatin (PRAVACHOL) 20 MG tablet Take 60 mg by mouth at bedtime.       . senna (SENOKOT) 8.6 MG tablet Alternates with one tablet one day then two tablets the next day       No current facility-administered medications for this visit.     Allergies:   Actos; Celebrex; Demerol; Morphine and related; Ciprofloxacin; Metformin; and Zocor   Social History:  The patient  reports that he quit smoking about 18 years ago. He has never used smokeless tobacco. He reports that he does not drink alcohol or use illicit drugs.   Family History:  The patient's family history includes Cancer in his mother; Heart attack in his brother and other family members; Hypertension in his mother. There is no history of Colon cancer or Stroke.   ROS:  Please see the history of present illness.   He does have chest discomfort after eating. He notes increased amounts of belching. He sees gastroenterology next week.   All other systems reviewed and negative.   PHYSICAL EXAM: VS:  BP 160/78  Pulse 67  Ht 5\' 6"  (1.676 m)  Wt 234 lb (106.142 kg)  BMI 37.79 kg/m2 Well nourished, well developed, in no acute  distress HEENT: normal Neck: no JVD Cardiac:  normal S1, S2; RRR; no murmur Lungs:  clear to auscultation bilaterally, no wheezing, rhonchi or rales Abd: soft, nontender,  no hepatomegaly Ext: no edema Skin: warm and dry Neuro:  CNs 2-12 intact, no focal abnormalities noted  EKG:  NSR, HR 67, poor R wave progression, PVC, no ST changes     ASSESSMENT AND PLAN:  1. Chest pain: His symptoms are very atypical. He had a recent nuclear scan in February that was low risk. He does have increased dyspnea with exertion. He is currently off of his nitrates. I reviewed his case with Dr. Tamala Julian. We will continue with medical management for now. I will resume his isosorbide a lower dose - 30 mg daily. I will check a BNP and an echocardiogram. If his BNP is increased, I will place him back on a lower dose of diuretic. If the patient continues to have symptoms or they progress, we may need to consider cardiac catheterization. 2. Shortness of breath - Plan: B Nat Peptide, 2D Echocardiogram without contrast. 3. Atherosclerosis of native coronary artery of native heart without angina pectoris: Resume isosorbide at a lower dose as noted above. Continue aspirin, Plavix, statin. 4. Unspecified essential hypertension: This is somewhat labile. His blood pressure seems to run higher in the office than at home.  Medication adjustments will be made as noted above. 5. Other and unspecified hyperlipidemia: Continue statin. 6. Tachy-brady syndrome s/pCardiac pacemaker in situ: Followup with EP as planned. 7. Carotid stenosis, unspecified laterality: Follow up carotid US planned in 04/2014. 8. GERD: He has follow up with gastroenterology soon. Continue current dose of PPI.  Disposition:  FU with Dr. Daneen Schick in 4 weeks.    Signed, Versie Starks, MHS 02/23/2014 11:30 AM    [facility] [place], [place], [place]  [address] Phone: [phone]; Fax: [phone]

## 2014-02-26 ENCOUNTER — Encounter: Payer: Self-pay | Admitting: Internal Medicine

## 2014-02-26 ENCOUNTER — Ambulatory Visit (INDEPENDENT_AMBULATORY_CARE_PROVIDER_SITE_OTHER): Payer: Medicare Other | Admitting: Internal Medicine

## 2014-02-26 VITALS — BP 134/68 | HR 60 | Ht 66.0 in | Wt 234.0 lb

## 2014-02-26 DIAGNOSIS — K589 Irritable bowel syndrome without diarrhea: Secondary | ICD-10-CM

## 2014-02-26 DIAGNOSIS — K5730 Diverticulosis of large intestine without perforation or abscess without bleeding: Secondary | ICD-10-CM | POA: Diagnosis not present

## 2014-02-26 DIAGNOSIS — I2510 Atherosclerotic heart disease of native coronary artery without angina pectoris: Secondary | ICD-10-CM

## 2014-02-26 NOTE — Assessment & Plan Note (Addendum)
Try Benefiber 2 tablespoons or more daily. He is reassured.

## 2014-02-26 NOTE — Assessment & Plan Note (Addendum)
We'll add Benefiber to his regimen. I reassured him as best I can. I have reviewed the images from his previous sigmoidoscopy and CT scan of explained that he gets luminal narrowing and that leads to his constipation and obstipation issues. I will see him back as needed.

## 2014-02-26 NOTE — Progress Notes (Signed)
   Subjective:    Patient ID: Eric [name], male    DOB: 09-22-1926, 80 y.o.   MRN: [id number]  HPI Mr. Eric [name] returns for followup. He continues to have problems, left lower quadrant pain that radiates into the upper abdomen is also suprapubic pain. As bothersome mostly in the mornings, when she defecates after drinking coffee he feels better. He is tried eating large amount of vegetables, he uses Senokot, use MiraLax nothing has worked consistently for him. I performed a sigmoidoscopy in the spring of this year to confirm severe diverticulosis in the left colon but no other problems. Medications, allergies, past medical history, past surgical history, family history and social history are reviewed and updated in the EMR.   Review of Systems As above    Objective:   Physical Exam Elderly white man in no acute distress Abdomen is obese soft with mild left lower quadrant tenderness to deep palpation  I reviewed the images from his most recent CT scan in person.    Assessment & Plan:  Irritable bowel syndrome Try Benefiber 2 tablespoons or more daily. He is reassured.  Symptomatic diverticulosis We'll add Benefiber to his regimen. I reassured him as best I can. I have reviewed the images from his previous sigmoidoscopy and CT scan of explained that he gets luminal narrowing and that leads to his constipation and obstipation issues. I will see him back as needed.

## 2014-02-26 NOTE — Patient Instructions (Addendum)
Information on a high fiber diet was given today for your review.  Please purchase Benefiber over the counter and take two tablespoons at bedtime. Can mix in water or juice  Thank you for the opportunity to treat you.

## 2014-02-27 ENCOUNTER — Ambulatory Visit: Payer: Medicare Other | Admitting: Internal Medicine

## 2014-02-28 ENCOUNTER — Encounter: Payer: Self-pay | Admitting: Physician Assistant

## 2014-02-28 ENCOUNTER — Ambulatory Visit (HOSPITAL_COMMUNITY): Payer: Medicare Other | Attending: Cardiology

## 2014-02-28 DIAGNOSIS — R072 Precordial pain: Secondary | ICD-10-CM | POA: Diagnosis not present

## 2014-02-28 DIAGNOSIS — I10 Essential (primary) hypertension: Secondary | ICD-10-CM | POA: Diagnosis not present

## 2014-02-28 DIAGNOSIS — E119 Type 2 diabetes mellitus without complications: Secondary | ICD-10-CM | POA: Diagnosis not present

## 2014-02-28 DIAGNOSIS — R079 Chest pain, unspecified: Secondary | ICD-10-CM | POA: Diagnosis not present

## 2014-02-28 DIAGNOSIS — Z87891 Personal history of nicotine dependence: Secondary | ICD-10-CM | POA: Insufficient documentation

## 2014-02-28 DIAGNOSIS — E785 Hyperlipidemia, unspecified: Secondary | ICD-10-CM | POA: Diagnosis not present

## 2014-02-28 DIAGNOSIS — I2510 Atherosclerotic heart disease of native coronary artery without angina pectoris: Secondary | ICD-10-CM

## 2014-02-28 DIAGNOSIS — R0602 Shortness of breath: Secondary | ICD-10-CM

## 2014-02-28 NOTE — Progress Notes (Signed)
2D Echo completed. 02/28/2014

## 2014-03-01 ENCOUNTER — Encounter: Payer: Self-pay | Admitting: Interventional Cardiology

## 2014-03-02 ENCOUNTER — Encounter: Payer: Self-pay | Admitting: Internal Medicine

## 2014-03-02 ENCOUNTER — Ambulatory Visit (INDEPENDENT_AMBULATORY_CARE_PROVIDER_SITE_OTHER): Payer: Medicare Other | Admitting: Internal Medicine

## 2014-03-02 ENCOUNTER — Other Ambulatory Visit (INDEPENDENT_AMBULATORY_CARE_PROVIDER_SITE_OTHER): Payer: Medicare Other

## 2014-03-02 VITALS — BP 130/60 | HR 72 | Temp 98.2°F | Resp 15 | Wt 232.5 lb

## 2014-03-02 DIAGNOSIS — I2510 Atherosclerotic heart disease of native coronary artery without angina pectoris: Secondary | ICD-10-CM

## 2014-03-02 DIAGNOSIS — R630 Anorexia: Secondary | ICD-10-CM | POA: Diagnosis not present

## 2014-03-02 DIAGNOSIS — R634 Abnormal weight loss: Secondary | ICD-10-CM

## 2014-03-02 DIAGNOSIS — K2100 Gastro-esophageal reflux disease with esophagitis, without bleeding: Secondary | ICD-10-CM

## 2014-03-02 LAB — AMYLASE: AMYLASE: 72 U/L (ref 27–131)

## 2014-03-02 LAB — H. PYLORI ANTIBODY, IGG: H Pylori IgG: NEGATIVE

## 2014-03-02 LAB — LIPASE: Lipase: 19 U/L (ref 11.0–59.0)

## 2014-03-02 NOTE — Progress Notes (Signed)
Pre visit review using our clinic review tool, if applicable. No additional management support is needed unless otherwise documented below in the visit note. 

## 2014-03-02 NOTE — Progress Notes (Signed)
   Subjective:    Patient ID: Eric [name], male    DOB: 1926/07/18, 80 y.o.   MRN: [id number]  HPI   He describes a sour taste and pain since July of this year. His Cardiologist told him to take Nexium twice a day which is not resolving symptoms  The symptoms are associated with substernal burning; he is convinced that this is related to his gallbladder.  He describes anorexia  has lost 20# since 06/15 ( Flow Sheet documents 13 # loss)  He  does not have symptoms related to ingesting fatty or greasy foods. He is not having right upper quadrant or right infrascapular pain.  His cardiology evaluation included EKG 9/23 which was negative  He also saw his gastroenterologist, Dr. Carlean Purl this week but failed to mention this. He did not realize Dr. Carlean Purl is an expert in gallbladder disease .  On 02/09/14 hemoglobin 12.6/hematocrit 37.4. This is improved compared to 12/20/13. LFTs WNL.  He denies triggers of aspirin or nonsteroidals. He also does not ingest peppermint, tea, chocolate, alcohol. He has one cup coffee a day.  Colonoscopy is up-to-date. 12/08/11 upper endoscopy by Dr Benson Norway revealed 2 cm HH & mild gastritis.[name]  He does describe intermittent left lower quadrant pain due to "swelling of the muscles". This is associated with constipation.      Review of Systems  Rectal bleeding, melena, or persistently small caliber stools are denied.    He is not having hoarseness or dysphagia.  He also denies hematemesis.  He has no fever, chills, or sweats  There is no dysuria, pyuria, or hematuria.      Objective:   Physical Exam  Positive or pertinent findings include: Pattern baldness is present. He wears bilateral hearing aids OS ptosis present Complete dentures are present. He has faint rales at both bases; there is no increased work of breathing or abnormal breath sounds otherwise. Slight tenting of the skin is present He has scattered mild bruising of the forearms.    General appearance :adequately nourished; in no distress. Eyes: No conjunctival inflammation or scleral icterus is present. Oral exam:  Lips and gums are healthy appearing.There is no oropharyngeal erythema or exudate noted.  Heart:  Normal rate and regular rhythm. S1 and S2 normal without gallop, murmur, click, rub or other extra sounds   Abdomen: bowel sounds normal, soft and non-tender without masses, organomegaly or hernias noted.  No guarding or rebound. No flank tenderness to percussion. Skin:Warm & dry.  Intact without suspicious lesions or rashes ; no jaundice  Lymphatic: No lymphadenopathy is noted about the head, neck, axilla         Assessment & Plan:  #1 reflux with dyspepsia #2 anorexia #3 weight loss  Clinically the history does not suggest gallbladder disease. Rule out H. pylori gastritis  Plan: See orders and recommendations

## 2014-03-02 NOTE — Patient Instructions (Signed)
Reflux of gastric acid may be asymptomatic as this may occur mainly during sleep.The triggers for reflux  include stress; the "aspirin family" ; alcohol; peppermint; and caffeine (coffee, tea, cola, and chocolate). The aspirin family would include aspirin and the nonsteroidal agents such as ibuprofen &  Naproxen. Tylenol would not cause reflux. If having symptoms ; food & drink should be avoided for @ least 2 hours before going to bed.   Your next office appointment will be determined based upon review of your pending labs  Those instructions will be transmitted to you  by mail  Followup as needed for your acute issue. Please report any significant change in your symptoms. 

## 2014-03-06 ENCOUNTER — Telehealth: Payer: Self-pay | Admitting: Interventional Cardiology

## 2014-03-06 ENCOUNTER — Telehealth: Payer: Self-pay

## 2014-03-06 NOTE — Telephone Encounter (Signed)
New message     Pt request to talk to [facility] again regarding medication and the [place]

## 2014-03-06 NOTE — Telephone Encounter (Signed)
The patient called stating that the [place] would not give him his meds. He states that [facility] to tell [facility] that [facility] had [facility]. So [facility] and left a message. Amber called me back and I asked her want needs to be done so he could get his IMDUR. She told me to fax in a progress note to [phone] . I faxed the last OV visit and I called the patient and left a message that I faxed in the information that the [place] needed

## 2014-03-08 ENCOUNTER — Telehealth: Payer: Self-pay | Admitting: Internal Medicine

## 2014-03-08 DIAGNOSIS — R634 Abnormal weight loss: Secondary | ICD-10-CM

## 2014-03-08 DIAGNOSIS — R1011 Right upper quadrant pain: Secondary | ICD-10-CM

## 2014-03-08 NOTE — Telephone Encounter (Signed)
Patient notified of appt date and times

## 2014-03-08 NOTE — Telephone Encounter (Signed)
Ct scan is scheduled for 03/13/14 2:30.   New labs entered Left message for patient to call back

## 2014-03-08 NOTE — Telephone Encounter (Signed)
Chart reviewed - chronic recurrent refractory GI sxs without objective abnormalities that would explain sxs  1) needs a calcium and albumin checked - dx hypercalcemia, weight loss 2) CT abd/pelvis with contrast - RUQ pain, weight loss (unintentional)  Further plans pending that Am ccing Dr. Linna Darner also

## 2014-03-08 NOTE — Telephone Encounter (Signed)
Patient reports continued abdominal pain.  The patient was recently evaluated in the office by Dr. Carlean Purl for IBS.  He reports that Dr. Linna Darner is referring him for gallbladder issues.  He reports that he has pain mainly in the lower abdomen between his hips, but occasionally the pain travels to the upper abdomen.  Dr. Carlean Purl there is a referral in from Dr. Linna Darner to return.  Please advise

## 2014-03-12 ENCOUNTER — Other Ambulatory Visit (INDEPENDENT_AMBULATORY_CARE_PROVIDER_SITE_OTHER): Payer: Medicare Other

## 2014-03-12 DIAGNOSIS — R634 Abnormal weight loss: Secondary | ICD-10-CM

## 2014-03-12 LAB — ALBUMIN: ALBUMIN: 4.1 g/dL (ref 3.5–5.2)

## 2014-03-12 LAB — CALCIUM: Calcium: 10.5 mg/dL (ref 8.4–10.5)

## 2014-03-12 NOTE — Telephone Encounter (Signed)
returned pt call pt sts that he has gotten his medications straightened out with the [place] and needs nothing further.pt adv to call the office if assistance is needed pt verbalized understanding.

## 2014-03-12 NOTE — Telephone Encounter (Signed)
follow up:   Pt called to say he still needs a  note  please sent  note/Script with the last 4 of pt SSN on the note Dr Hortencia Conradi wants to know why med dosage was changed as well.   To attn. Dr Hortencia Conradi fax # [phone]  Medication ISOSORBIBEMNER 30 mg.

## 2014-03-13 ENCOUNTER — Encounter: Payer: Self-pay | Admitting: Internal Medicine

## 2014-03-13 ENCOUNTER — Ambulatory Visit (INDEPENDENT_AMBULATORY_CARE_PROVIDER_SITE_OTHER)
Admission: RE | Admit: 2014-03-13 | Discharge: 2014-03-13 | Disposition: A | Discharge status: Home / Self Care | Payer: Medicare Other | Source: Ambulatory Visit | Attending: Internal Medicine | Admitting: Internal Medicine

## 2014-03-13 DIAGNOSIS — R1011 Right upper quadrant pain: Secondary | ICD-10-CM

## 2014-03-13 DIAGNOSIS — K5732 Diverticulitis of large intestine without perforation or abscess without bleeding: Secondary | ICD-10-CM | POA: Insufficient documentation

## 2014-03-13 DIAGNOSIS — R634 Abnormal weight loss: Secondary | ICD-10-CM | POA: Diagnosis not present

## 2014-03-13 DIAGNOSIS — N281 Cyst of kidney, acquired: Secondary | ICD-10-CM | POA: Diagnosis not present

## 2014-03-13 MED ORDER — IOHEXOL 300 MG/ML  SOLN
100.0000 mL | Freq: Once | INTRAMUSCULAR | Status: AC | PRN
  Administered 2014-03-13: 100 mL via INTRAVENOUS

## 2014-03-13 NOTE — Progress Notes (Signed)
Quick Note:  CT shows mild diverticulitis Treat with Augmentin 875 mg bid with food x 14 days ______

## 2014-03-14 ENCOUNTER — Other Ambulatory Visit: Payer: Self-pay

## 2014-03-14 MED ORDER — AMOXICILLIN-POT CLAVULANATE 875-125 MG PO TABS: 1.0000 | ORAL_TABLET | Freq: Two times a day (BID) | ORAL | Status: DC

## 2014-03-15 ENCOUNTER — Telehealth: Payer: Self-pay | Admitting: Internal Medicine

## 2014-03-15 DIAGNOSIS — M25512 Pain in left shoulder: Secondary | ICD-10-CM | POA: Diagnosis not present

## 2014-03-15 NOTE — Telephone Encounter (Signed)
New message     Pt has been having pain in his shoulder blade and pacemaker area.  If he takes a pain pill---the pain goes away.  He has an appt today with the orthopedic thinking it is muscular. However, when he presses on the pacemaker, that area is sore.  Please adivse.

## 2014-03-15 NOTE — Telephone Encounter (Signed)
I spoke to Eric [name] about his recent ppm/shoulder blade pains. Patient states that the pain feels muscular in nature and not true CP. He states that he gets relief from warm showers and pain meds. He's been having the pains since he parked on a hill and had to catch his car door 2 weeks ago. I informed the pt that based on what he's describing it sounds as though he pulled a muscle. I encouraged him to call his ortho doc, but pt stated that he already had an appt for 11/2. I offered pt an appt to be seen in the office if he wanted to be seen in person. Patient states that he felt that an appt wasn't needed at this time. I asked him to call back around the middle of next week if the pains persist and he wants to be seen. Patient voiced understanding.

## 2014-03-16 ENCOUNTER — Ambulatory Visit: Payer: Medicare Other | Admitting: Internal Medicine

## 2014-03-22 DIAGNOSIS — R351 Nocturia: Secondary | ICD-10-CM | POA: Diagnosis not present

## 2014-03-27 ENCOUNTER — Ambulatory Visit: Payer: Medicare Other | Admitting: Interventional Cardiology

## 2014-03-27 ENCOUNTER — Ambulatory Visit (INDEPENDENT_AMBULATORY_CARE_PROVIDER_SITE_OTHER): Payer: Medicare Other | Admitting: Interventional Cardiology

## 2014-03-27 ENCOUNTER — Encounter: Payer: Self-pay | Admitting: Interventional Cardiology

## 2014-03-27 VITALS — BP 136/69 | HR 60 | Ht 66.0 in | Wt 229.0 lb

## 2014-03-27 DIAGNOSIS — I2510 Atherosclerotic heart disease of native coronary artery without angina pectoris: Secondary | ICD-10-CM | POA: Diagnosis not present

## 2014-03-27 DIAGNOSIS — I495 Sick sinus syndrome: Secondary | ICD-10-CM

## 2014-03-27 DIAGNOSIS — K219 Gastro-esophageal reflux disease without esophagitis: Secondary | ICD-10-CM | POA: Insufficient documentation

## 2014-03-27 DIAGNOSIS — R109 Unspecified abdominal pain: Secondary | ICD-10-CM | POA: Diagnosis not present

## 2014-03-27 NOTE — Patient Instructions (Signed)
Your physician recommends that you continue on your current medications as directed. Please refer to the Current Medication list given to you today.  Your physician wants you to follow-up in: 1 year. You will receive a reminder letter in the mail two months in advance. If you don't receive a letter, please call our office to schedule the follow-up appointment.  

## 2014-03-27 NOTE — Progress Notes (Signed)
Patient ID: Eric [name], male   DOB: [birth year], 80 y.o.   MRN: [id number]    [address], [address], [place]  [address] Phone: [phone] Fax:  [phone]  Date:  03/27/2014   ID:  Eric [name], DOB 06-24-1926, MRN [id number]  PCP:  [name], MD   ASSESSMENT:  1. Coronary artery disease, asymptomatic 2. Hypertension, controlled 3. Right upper quadrant discomfort and pain of uncertain cause 4. Hyperlipidemia, currently on therapy 5. Tachycardia/bradycardia syndrome 6. Probable gastroesophageal reflux producing substernal burning  PLAN:  1. The patient's cardiac status seems to be stable. No other symptoms or exertion related. I believe no specific cardiac evaluation is indicated at this time. 2. Perhaps needs evaluation or intensified therapy for reflux. 3. Clinical followup in one year 4. Continue routine cardiac pacemaker followup   SUBJECTIVE: Eric [name] is a 80 y.o. male who is doing well. His main complaint is right upper quadrant discomfort is in the mid. The other complaint is postprandial substernal burning. The discomfort gradually resolves with time. There is no associated dyspnea. Physical activity does not reproduce the discomfort. Angina has never had this quality of sensation.  Right upper quadrant discomfort is intermittent. He is also having some very mild lower abdominal discomfort that he has been told related to diverticulosis. Appetite is been stable. He denies dysphagia. No weight loss his been noted.   Wt Readings from Last 3 Encounters:  03/27/14 229 lb (103.874 kg)  03/02/14 232 lb 8 oz (105.461 kg)  02/26/14 234 lb (106.142 kg)     Past Medical History  Diagnosis Date  . LACTOSE INTOLERANCE   . OBESITY   . CARDIOMYOPATHY, ISCHEMIC   . AORTIC SCLEROSIS   . SICK SINUS/ TACHY-BRADY SYNDROME 09/2007    s/p PPM st judes  . PERIPHERAL VASCULAR DISEASE   . CAROTID BRUIT, RIGHT 02/27/2008  . IBS (irritable bowel  syndrome)   . ALLERGIC RHINITIS   . ANEMIA-NOS   . OA (osteoarthritis)   . COPD   . GERD   . HYPERLIPIDEMIA   . HIATAL HERNIA   . Diverticulosis   . Prostate cancer     seed implants 2004  . DIABETES MELLITUS-TYPE II     diet controlled  . CORONARY ARTERY DISEASE     CABG 1995, PTCA/DES 2008, 2009 and 08/2010  . HYPERTENSION   . Asthma   . Sleep apnea   . Partial small bowel obstruction   . Primary hyperparathyroidism     Lab Results Component Value Date  PTH 150.7* 02/13/2013  CALCIUM 11.0* 02/13/2013  CAION 1.21 03/15/2008    . SMALL BOWEL OBSTRUCTION 04/18/2009    Qualifier: History of  By: Asa Lente MD, Jannifer Rodney Cataract     surgery  . Symptomatic diverticulosis 01/18/2009    Qualifier: Diagnosis of  By: Trellis Paganini PA-c, Amy S   . Hx of echocardiogram     Echo (9/15):  Mild LVH, EF 50-55%, no RWMA, Gr 1 DD, MAC, mild LAE.    Current Outpatient Prescriptions  Medication Sig Dispense Refill  . albuterol (PROVENTIL HFA) 108 (90 BASE) MCG/ACT inhaler Inhale 2 puffs into the lungs every 4 (four) hours as needed. Wheezing       . albuterol (PROVENTIL) (2.5 MG/3ML) 0.083% nebulizer solution Take 3 mLs (2.5 mg total) by nebulization every 4 (four) hours as needed (Dx: COPD 496). Wheezing  75 mL  5  . aluminum & magnesium hydroxide (MAALOX)  225-200 MG/5ML suspension Take 15 mLs by mouth every 6 (six) hours as needed. Heart burn       . amoxicillin-clavulanate (AUGMENTIN) 875-125 MG per tablet Take 1 tablet by mouth 2 (two) times daily.  28 tablet  0  . aspirin EC 81 MG tablet Take 1 tablet (81 mg total) by mouth daily.  150 tablet  2  . clopidogrel (PLAVIX) 75 MG tablet Take 75 mg by mouth daily after breakfast.       . esomeprazole (NEXIUM) 40 MG capsule Take 40 mg by mouth 2 (two) times daily.      . fluticasone (FLONASE) 50 MCG/ACT nasal spray Place 2 sprays into the nose daily. Allergies  16 g  1  . HYDROcodone-acetaminophen (NORCO) 7.5-325 MG per tablet Take 1-2 tablets by  mouth every 4 (four) hours as needed.      . isosorbide mononitrate (IMDUR) 30 MG 24 hr tablet Take 1 tablet (30 mg total) by mouth every evening.  30 tablet  11  . Niacin CR 1000 MG TBCR Take 1,000 mg by mouth at bedtime.       . nitroGLYCERIN (NITROSTAT) 0.4 MG SL tablet Place 0.4 mg under the tongue every 5 (five) minutes as needed. Chest pain       . Polyethyl Glycol-Propyl Glycol (SYSTANE) 0.4-0.3 % SOLN Apply 2 drops to eye at bedtime.      . polyethylene glycol (MIRALAX / GLYCOLAX) packet Take 17 g by mouth daily as needed. Constipation       . pravastatin (PRAVACHOL) 20 MG tablet Take 60 mg by mouth at bedtime.       . senna (SENOKOT) 8.6 MG tablet Alternates with one tablet one day then two tablets the next day      . valsartan (DIOVAN) 160 MG tablet Take 1 tablet (160 mg total) by mouth 2 (two) times daily.       No current facility-administered medications for this visit.    Allergies:    Allergies  Allergen Reactions  . Actos [Pioglitazone Hydrochloride] Other (See Comments)    "felt funny, drowsy, and weak":  . Celebrex [Celecoxib] Other (See Comments)    "felt funny"  . Demerol Palpitations and Other (See Comments)    Increased BP  . Morphine And Related Nausea And Vomiting  . Ciprofloxacin Other (See Comments)    arthralgia  . Metformin Nausea And Vomiting  . Zocor [Simvastatin] Other (See Comments)    Makes pt very drowsy    Social History:  The patient  reports that he quit smoking about 18 years ago. He has never used smokeless tobacco. He reports that he does not drink alcohol or use illicit drugs.   ROS:  Please see the history of present illness.   Denies blood in his urine or stool. Has not needed sublingual nitroglycerin.   All other systems reviewed and negative.   OBJECTIVE: VS:  BP 136/69  Pulse 60  Ht 5\' 6"  (1.676 m)  Wt 229 lb (103.874 kg)  BMI 36.98 kg/m2  SpO2 94% Well nourished, well developed, in no acute distress, obese HEENT: normal Neck:  JVD flat. Carotid bruit absent  Cardiac:  normal S1, S2; RRR; no murmur Lungs:  clear to auscultation bilaterally, no wheezing, rhonchi or rales Abd: soft, nontender, no hepatomegaly Ext: Edema absent. Pulses 2+ Skin: warm and dry Neuro:  CNs 2-12 intact, no focal abnormalities noted  EKG:  Not performed       Signed, Lynnell Dike. B.  Blenda Bridegroom, MD 03/27/2014 5:01 PM

## 2014-03-28 ENCOUNTER — Telehealth: Payer: Self-pay | Admitting: Interventional Cardiology

## 2014-03-28 NOTE — Telephone Encounter (Signed)
Pt is aware that according to our records, no one had called him today. Pt verbalized understanding.

## 2014-03-28 NOTE — Telephone Encounter (Signed)
Patient states he has received 3 phone calls today.  Did someone in the clinical area call?

## 2014-03-31 ENCOUNTER — Ambulatory Visit: Payer: Medicare Other | Admitting: Family Medicine

## 2014-04-04 ENCOUNTER — Encounter: Payer: Self-pay | Admitting: Internal Medicine

## 2014-04-04 ENCOUNTER — Ambulatory Visit (INDEPENDENT_AMBULATORY_CARE_PROVIDER_SITE_OTHER): Payer: Medicare Other | Admitting: Internal Medicine

## 2014-04-04 ENCOUNTER — Telehealth: Payer: Self-pay | Admitting: Internal Medicine

## 2014-04-04 VITALS — BP 142/60 | HR 81 | Temp 98.4°F | Wt 226.0 lb

## 2014-04-04 DIAGNOSIS — J069 Acute upper respiratory infection, unspecified: Secondary | ICD-10-CM | POA: Diagnosis not present

## 2014-04-04 DIAGNOSIS — K5732 Diverticulitis of large intestine without perforation or abscess without bleeding: Secondary | ICD-10-CM

## 2014-04-04 DIAGNOSIS — H6012 Cellulitis of left external ear: Secondary | ICD-10-CM | POA: Diagnosis not present

## 2014-04-04 DIAGNOSIS — J209 Acute bronchitis, unspecified: Secondary | ICD-10-CM

## 2014-04-04 DIAGNOSIS — I2510 Atherosclerotic heart disease of native coronary artery without angina pectoris: Secondary | ICD-10-CM

## 2014-04-04 MED ORDER — DOXYCYCLINE HYCLATE 100 MG PO TABS: 100.0000 mg | ORAL_TABLET | Freq: Two times a day (BID) | ORAL | Status: DC

## 2014-04-04 NOTE — Progress Notes (Signed)
Pre visit review using our clinic review tool, if applicable. No additional management support is needed unless otherwise documented below in the visit note. 

## 2014-04-04 NOTE — Patient Instructions (Signed)
Use warm moist compresses to 3 times a day to the affected area.  Plain Mucinex (NOT D) for thick secretions ;force NON dairy fluids .   Nasal cleansing in the shower as discussed with lather of mild shampoo.After 10 seconds wash off lather while  exhaling through nostrils. Make sure that all residual soap is removed to prevent irritation.  Flonase OR Nasacort AQ 1 spray in each nostril twice a day as needed. Use the "crossover" technique into opposite nostril spraying toward opposite ear @ 45 degree angle, not straight up into nostril.  Use a Neti pot daily only  as needed for significant sinus congestion; going from open side to congested side . Plain Allegra (NOT D )  160 daily , Loratidine 10 mg , OR Zyrtec 10 mg @ bedtime  as needed for itchy eyes & sneezing.

## 2014-04-04 NOTE — Telephone Encounter (Signed)
Got scheduled for today at 3:30.

## 2014-04-04 NOTE — Progress Notes (Signed)
   Subjective:    Patient ID: Eric [name], male    DOB: 19-Dec-1926, 80 y.o.   MRN: [id number]  HPI   He started having a cough as of 03/24/14. It has progressed with production of significant amounts of yellow phlegm as of 10/23.  Now he has almost equal volume of yellow nasal discharge as well.  He describes frontal and facial pressure which improves through the day only to recur the next morning.  He also has associated itchy, watery eyes.  He has sneezing as well as wheezing. He has been using his nebulizer  As of last night he developed erythema and swelling and tenderness of the left superior ear. This is a recurrent problem in the cold weather.  He completed a course of Augmentin from his gastroenterologist apparently for diverticulitis 4 days ago. (Note: I found this listed 10/7- 10/21 ; prescribed by phone after CT scan review by Dr Carlean Purl)   Review of Systems   He denies persistent frontal headache or facial pain.  He denies any otic discharge  He also supervising denies fever, chills, or sweats  He has no associated pleuritic pain. He has no associated shortness of breath.     Objective:   Physical Exam   Positive or pertinent findings include: Pattern alopecia is present. He has bilateral hearing aids There is erythema and edema of the left superior ear. This is slightly tender to touch.No vesicle or pustule present Hyperemia is noted over the left tympanic membrane without any purulence or discharge There is significant scarring of the right tympanic membrane He has S4 slurring & flow type murmur. Cough induces diffuse low-grade wheezing. Abdomen is protuberant. He has scattered bruising over the forearms.       Assessment & Plan:  #1 asthmatic bronchitis   #2 upper respiratory infection  #3 possible early cellulitis left external ear  #4 status post recent course of Augmentin for diverticulitis on CT scan  Plan: See orders  &  recommendations

## 2014-04-04 NOTE — Telephone Encounter (Signed)
Pt has cough,cold, spitting up yellow gunk, pt has been taking Dayquil cold & flu OTC meds. Pt would prefer not coming in for appt. Please advise or suggestions and call pt. [phone]

## 2014-04-04 NOTE — Telephone Encounter (Signed)
The standard of care is that antibiotics are not prescribed without an office visit.This policy is to help prevent serious health threatening infections with antibiotic resistant organisms or complications such as antibiotic-induced colitis. OV open @ 3:30 or 4:30 today

## 2014-04-05 DIAGNOSIS — K5732 Diverticulitis of large intestine without perforation or abscess without bleeding: Secondary | ICD-10-CM | POA: Insufficient documentation

## 2014-04-06 ENCOUNTER — Encounter: Payer: Self-pay | Admitting: Internal Medicine

## 2014-04-06 DIAGNOSIS — H26491 Other secondary cataract, right eye: Secondary | ICD-10-CM | POA: Diagnosis not present

## 2014-04-06 DIAGNOSIS — E119 Type 2 diabetes mellitus without complications: Secondary | ICD-10-CM | POA: Diagnosis not present

## 2014-04-06 DIAGNOSIS — H02054 Trichiasis without entropian left upper eyelid: Secondary | ICD-10-CM | POA: Diagnosis not present

## 2014-04-06 DIAGNOSIS — H02051 Trichiasis without entropian right upper eyelid: Secondary | ICD-10-CM | POA: Diagnosis not present

## 2014-04-06 LAB — HM DIABETES EYE EXAM

## 2014-04-09 ENCOUNTER — Other Ambulatory Visit: Payer: Self-pay | Admitting: Internal Medicine

## 2014-04-10 ENCOUNTER — Telehealth: Payer: Self-pay | Admitting: Internal Medicine

## 2014-04-10 MED ORDER — ALBUTEROL SULFATE (2.5 MG/3ML) 0.083% IN NEBU: 2.5000 mg | INHALATION_SOLUTION | Freq: Four times a day (QID) | RESPIRATORY_TRACT | Status: DC | PRN

## 2014-04-10 MED ORDER — ALBUTEROL SULFATE (2.5 MG/3ML) 0.083% IN NEBU
2.5000 mg | INHALATION_SOLUTION | Freq: Four times a day (QID) | RESPIRATORY_TRACT | Status: DC | PRN
Start: 2014-04-10 — End: 2014-04-13

## 2014-04-10 NOTE — Telephone Encounter (Signed)
Called spoke with pt. He is requesting refill on albuterol neb. i have sent this in. Nothing further needed

## 2014-04-10 NOTE — Telephone Encounter (Signed)
[facility] calling to check on status of refill for pt albuterol can be reached @ [phone].Hillery Hunter

## 2014-04-11 LAB — HM DIABETES FOOT EXAM

## 2014-04-11 LAB — BASIC METABOLIC PANEL
BUN: 11 mg/dL (ref 4–21)
Creatinine: 1 mg/dL (ref 0.6–1.3)
Glucose: 158 mg/dL
Potassium: 4.4 mmol/L (ref 3.4–5.3)
SODIUM: 135 mmol/L — AB (ref 137–147)

## 2014-04-11 LAB — HEPATIC FUNCTION PANEL
ALK PHOS: 84 U/L (ref 25–125)
ALT: 33 U/L (ref 10–40)
AST: 25 U/L (ref 14–40)
Bilirubin, Direct: 0.2 mg/dL (ref 0.01–0.4)
Bilirubin, Total: 0.7 mg/dL

## 2014-04-11 LAB — CBC AND DIFFERENTIAL
HEMATOCRIT: 41 % (ref 41–53)
Hemoglobin: 13.8 g/dL (ref 13.5–17.5)
PLATELETS: 176 10*3/uL (ref 150–399)
WBC: 7.7 10*3/mL

## 2014-04-11 LAB — LIPID PANEL
Cholesterol: 89 mg/dL (ref 0–200)
HDL: 40 mg/dL (ref 35–70)
LDL CALC: 30 mg/dL
TRIGLYCERIDES: 93 mg/dL (ref 40–160)

## 2014-04-11 LAB — HEMOGLOBIN A1C: Hgb A1c MFr Bld: 6.9 % — AB (ref 4.0–6.0)

## 2014-04-11 LAB — TSH: TSH: 0.93 u[IU]/mL (ref 0.41–5.90)

## 2014-04-12 DIAGNOSIS — H2640 Unspecified secondary cataract: Secondary | ICD-10-CM | POA: Diagnosis not present

## 2014-04-12 DIAGNOSIS — H26491 Other secondary cataract, right eye: Secondary | ICD-10-CM | POA: Diagnosis not present

## 2014-04-14 ENCOUNTER — Ambulatory Visit (HOSPITAL_COMMUNITY)
Admission: RE | Admit: 2014-04-14 | Discharge: 2014-04-14 | Disposition: A | Discharge status: Home / Self Care | Payer: Medicare Other | Source: Ambulatory Visit | Attending: Family Medicine | Admitting: Family Medicine

## 2014-04-14 ENCOUNTER — Encounter: Payer: Self-pay | Admitting: Family Medicine

## 2014-04-14 ENCOUNTER — Ambulatory Visit (INDEPENDENT_AMBULATORY_CARE_PROVIDER_SITE_OTHER): Payer: Medicare Other | Admitting: Family Medicine

## 2014-04-14 VITALS — BP 130/80 | HR 72 | Temp 98.5°F | Resp 16

## 2014-04-14 DIAGNOSIS — M1990 Unspecified osteoarthritis, unspecified site: Secondary | ICD-10-CM | POA: Insufficient documentation

## 2014-04-14 DIAGNOSIS — M79601 Pain in right arm: Secondary | ICD-10-CM | POA: Insufficient documentation

## 2014-04-14 DIAGNOSIS — W19XXXA Unspecified fall, initial encounter: Secondary | ICD-10-CM | POA: Diagnosis not present

## 2014-04-14 DIAGNOSIS — S59911A Unspecified injury of right forearm, initial encounter: Secondary | ICD-10-CM | POA: Diagnosis not present

## 2014-04-14 DIAGNOSIS — M79641 Pain in right hand: Secondary | ICD-10-CM

## 2014-04-14 DIAGNOSIS — S51811A Laceration without foreign body of right forearm, initial encounter: Secondary | ICD-10-CM | POA: Diagnosis not present

## 2014-04-14 DIAGNOSIS — M79631 Pain in right forearm: Secondary | ICD-10-CM | POA: Diagnosis not present

## 2014-04-14 DIAGNOSIS — S6991XA Unspecified injury of right wrist, hand and finger(s), initial encounter: Secondary | ICD-10-CM | POA: Diagnosis not present

## 2014-04-14 DIAGNOSIS — M25531 Pain in right wrist: Secondary | ICD-10-CM | POA: Diagnosis not present

## 2014-04-14 DIAGNOSIS — S5011XA Contusion of right forearm, initial encounter: Secondary | ICD-10-CM

## 2014-04-14 NOTE — Progress Notes (Signed)
[facility] [place], [place] Phone: [phone]  Patient ID: Eric [name] MRN: [id number], DOB: 01-12-1927, 80 y.o. Date of Encounter: 04/14/2014  Primary Physician:  [name], MD   Chief Complaint: fell and cut arm  Subjective:   History of Present Illness:  Eric [name] is a 80 y.o. pleasant patient who presents with the following:  I was asked to evaluate the patient urgently by her staff.  The patient presented after falling this morning, and he reported that he had done this approximately one hour prior to coming to our clinic.  He was bleeding at the time, and he had macerated 2 areas on his forearms that were still somewhat bleeding.  He was holding his RIGHT forearm at the time of my initial encounter.  He is on multiple medications, multiple medical problems, and he is on antiplatelet medication.  He has extensive bruising.  At this time and hematomas forming on the dorsum of the patient's distal forearm.  Immunization History  Administered Date(s) Administered  . Influenza Split 06/09/2011, 03/01/2012  . Influenza,inj,Quad PF,36+ Mos 02/02/2013, 01/23/2014  . Pneumococcal Polysaccharide-23 02/27/2008  . Td 08/07/2008     The PMH, PSH, Social History, Family History, Medications, and allergies have been reviewed in [facility], and have been updated if relevant.  Review of Systems:  GEN: No acute illnesses, no fevers, chills. GI: No n/v/d, eating normally Pulm: No SOB Interactive and getting along well at home.  Otherwise, ROS is as per the HPI.  Objective:   Physical Examination: Blood pressure 130/80, pulse 72, temperature 98.5 F (36.9 C), resp. rate 16.   GEN: WDWN, NAD, Non-toxic, A & O x 3 HEENT: Atraumatic, Normocephalic. Neck supple. No masses, No LAD. Ears and Nose: No external deformity. EXTR: No c/c/e NEURO Normal gait.  PSYCH: Normally interactive. Conversant.  Not depressed or anxious appearing.  Calm demeanor.   Patient is able to move about the shoulder.  He is grossly nontender along the clavicle and at the ac joint.  His grossly nontender along the shaft of his humerus.  He does have some shoulder pain, and some shoulder pain with movement and abduction.  He is able to fully flex and extend at the elbow and fully supinate and pronate.  Nontender throughout the entirety of the elbow anatomy.  In the proximal radius and ulna, the patient is grossly unremarkable.  Compression of the radius and ulna together proximally does cause some pain distally.  There is a relatively large hematoma that is forming on the patient's distal dorsum of his forearm.  There are 2 areas where the skin has been denuded approximately the size of a dime on both areas.  There are also some areas of skin that are flapping but incredibly thin adjacent to this.  The skin itself is purplish in coloration.  On the patient's 2nd metacarpal he does have some bruising, and he does note in his tender to palpation from his metacarpal through to the carpal bones on that area.  All phalanges are nontender.  All carpal bones otherwise are nontender and all other metacarpals are nontender.  Laboratory and Imaging Data: Dg Forearm Right  04/14/2014   CLINICAL DATA:  Fall, right arm pain and trauma  EXAM: RIGHT FOREARM - 2 VIEW  COMPARISON:  Hand radiographs same date, dictated separately  FINDINGS: There is no evidence of fracture or other focal bone lesions. Soft tissues are unremarkable. Bandage material  obscures some soft tissue detail over the distal forearm, with possible dorsal soft tissue swelling.  IMPRESSION: No acute osseous finding of the forearm.   Electronically Signed   By: Conchita Paris M.D.   On: 04/14/2014 11:29   Dg Hand Complete Right  04/14/2014   CLINICAL DATA:  Fall, and trauma and pain  EXAM: RIGHT HAND - COMPLETE 3+ VIEW  COMPARISON:  Forearm radiographs same date   FINDINGS: There is no evidence of fracture or dislocation. There is no evidence of arthropathy or other focal bone abnormality. Soft tissues are unremarkable. Material obscures detail the soft tissues of the distal forearm, with suggestion of underlying dorsal soft tissue swelling. Mild DIP osteoarthritic degenerative change.  IMPRESSION: Negative.   Electronically Signed   By: Conchita Paris M.D.   On: 04/14/2014 11:30     Assessment & Plan:   Traumatic hematoma of forearm, right, initial encounter  Forearm joint pain, right - Plan: DG Forearm Right  Hand pain, right - Plan: DG Hand Complete Right  Laceration of right forearm with complication, initial encounter   >25 minutes spent in face to face time with patient, >50% spent in counselling or coordination of care  Acute trauma.  Clinical concern for potential hand fracture or potential forearm fracture.  Exam limited somewhat by pain and large hematoma and soft tissue damage with fall in an elderly male on Plavix.  X-rays are negative for occult fracture.  I dressed the wounds with a Telfa bandage and wrapped it with Coban and with light pressure.  Tetanus is up-to-date.  This scan in this gentleman was damaged and really more macerated or abraded, but I do not think that it was repairable at all and was very very thin.  Additional musculoskeletal injuries including shoulder  Pathology could surface as the patient's other  Injuries resolve.  Orders Placed This Encounter  Procedures  . DG Forearm Right  . DG Hand Complete Right    Signed,  Jyaire Koudelka T. Jeryn Cerney, MD   Patient's Medications  New Prescriptions   No medications on file  Previous Medications   ALBUTEROL (PROVENTIL HFA) 108 (90 BASE) MCG/ACT INHALER    Inhale 2 puffs into the lungs every 4 (four) hours as needed. Wheezing    ALBUTEROL (PROVENTIL) (2.5 MG/3ML) 0.083% NEBULIZER SOLUTION    USE 1 VIAL WITH NEBULIZER EVERY 4 HOURS AS NEEDED FOR WHEEZING   ALUMINUM &  MAGNESIUM HYDROXIDE (MAALOX) 225-200 MG/5ML SUSPENSION    Take 15 mLs by mouth every 6 (six) hours as needed. Heart burn    ASPIRIN EC 81 MG TABLET    Take 1 tablet (81 mg total) by mouth daily.   CLOPIDOGREL (PLAVIX) 75 MG TABLET    Take 75 mg by mouth daily after breakfast.    DOXYCYCLINE (VIBRA-TABS) 100 MG TABLET    Take 1 tablet (100 mg total) by mouth 2 (two) times daily.   ESOMEPRAZOLE (NEXIUM) 40 MG CAPSULE    Take 40 mg by mouth 2 (two) times daily.   FLUTICASONE (FLONASE) 50 MCG/ACT NASAL SPRAY    Place 2 sprays into the nose daily. Allergies   HYDROCODONE-ACETAMINOPHEN (NORCO) 7.5-325 MG PER TABLET    Take 1-2 tablets by mouth every 4 (four) hours as needed.   ISOSORBIDE MONONITRATE (IMDUR) 30 MG 24 HR TABLET    Take 1 tablet (30 mg total) by mouth every evening.   NIACIN CR 1000 MG TBCR    Take 1,000 mg by mouth at bedtime.  NITROGLYCERIN (NITROSTAT) 0.4 MG SL TABLET    Place 0.4 mg under the tongue every 5 (five) minutes as needed. Chest pain    POLYETHYL GLYCOL-PROPYL GLYCOL (SYSTANE) 0.4-0.3 % SOLN    Apply 2 drops to eye at bedtime.   PRAVASTATIN (PRAVACHOL) 20 MG TABLET    Take 60 mg by mouth at bedtime.    SENNA (SENOKOT) 8.6 MG TABLET    Alternates with one tablet one day then two tablets the next day   VALSARTAN (DIOVAN) 160 MG TABLET    Take 1 tablet (160 mg total) by mouth 2 (two) times daily.  Modified Medications   No medications on file  Discontinued Medications   No medications on file

## 2014-04-16 ENCOUNTER — Telehealth: Payer: Self-pay | Admitting: Internal Medicine

## 2014-04-16 DIAGNOSIS — J438 Other emphysema: Secondary | ICD-10-CM

## 2014-04-16 NOTE — Telephone Encounter (Signed)
Pt wants [facility] instead advanced he would rather use [facility]

## 2014-04-16 NOTE — Telephone Encounter (Signed)
Pt needs new neb supplies, states his machine works fine and he has plenty of medication.  He has been getting his neb supplies through [facility], but wants to switch to [facility].  He receives his cpap supplies through [facility].    Dr. Annamaria Boots are you ok with this DME switch and order?  Thanks!  Current Outpatient Prescriptions on File Prior to Visit  Medication Sig Dispense Refill  . albuterol (PROVENTIL HFA) 108 (90 BASE) MCG/ACT inhaler Inhale 2 puffs into the lungs every 4 (four) hours as needed. Wheezing     . albuterol (PROVENTIL) (2.5 MG/3ML) 0.083% nebulizer solution USE 1 VIAL WITH NEBULIZER EVERY 4 HOURS AS NEEDED FOR WHEEZING 75 mL 2  . aluminum & magnesium hydroxide (MAALOX) 225-200 MG/5ML suspension Take 15 mLs by mouth every 6 (six) hours as needed. Heart burn     . aspirin EC 81 MG tablet Take 1 tablet (81 mg total) by mouth daily. 150 tablet 2  . clopidogrel (PLAVIX) 75 MG tablet Take 75 mg by mouth daily after breakfast.     . doxycycline (VIBRA-TABS) 100 MG tablet Take 1 tablet (100 mg total) by mouth 2 (two) times daily. 14 tablet 0  . esomeprazole (NEXIUM) 40 MG capsule Take 40 mg by mouth 2 (two) times daily.    . fluticasone (FLONASE) 50 MCG/ACT nasal spray Place 2 sprays into the nose daily. Allergies 16 g 1  . HYDROcodone-acetaminophen (NORCO) 7.5-325 MG per tablet Take 1-2 tablets by mouth every 4 (four) hours as needed.    . isosorbide mononitrate (IMDUR) 30 MG 24 hr tablet Take 1 tablet (30 mg total) by mouth every evening. 30 tablet 11  . Niacin CR 1000 MG TBCR Take 1,000 mg by mouth at bedtime.     . nitroGLYCERIN (NITROSTAT) 0.4 MG SL tablet Place 0.4 mg under the tongue every 5 (five) minutes as needed. Chest pain     . Polyethyl Glycol-Propyl Glycol (SYSTANE) 0.4-0.3 % SOLN Apply 2 drops to eye at bedtime.    . pravastatin (PRAVACHOL) 20 MG tablet Take 60 mg by mouth at bedtime.     . senna (SENOKOT) 8.6 MG tablet Alternates with one tablet one day then two tablets the next day     . valsartan (DIOVAN) 160 MG tablet Take 1 tablet (160 mg total) by mouth 2 (two) times daily.     No current facility-administered medications on file prior to visit.   Allergies  Allergen Reactions  . Actos [Pioglitazone Hydrochloride] Other (See Comments)    "felt funny, drowsy, and weak":  . Celebrex [Celecoxib] Other (See Comments)    "felt funny"  . Demerol Palpitations and Other (See Comments)    Increased BP  . Morphine And Related Nausea And Vomiting  . Ciprofloxacin Other (See Comments)    arthralgia  . Metformin Nausea And Vomiting  . Zocor [Simvastatin] Other (See Comments)    Makes pt very drowsy

## 2014-04-16 NOTE — Telephone Encounter (Signed)
Order sent to [facility]  Pt aware  Nothing further needed 

## 2014-04-16 NOTE — Telephone Encounter (Signed)
Ok to change DME to [facility] and to refill supplies for nebulizer machine

## 2014-04-24 DIAGNOSIS — H02052 Trichiasis without entropian right lower eyelid: Secondary | ICD-10-CM | POA: Diagnosis not present

## 2014-04-24 DIAGNOSIS — H02055 Trichiasis without entropian left lower eyelid: Secondary | ICD-10-CM | POA: Diagnosis not present

## 2014-04-25 DIAGNOSIS — B379 Candidiasis, unspecified: Secondary | ICD-10-CM | POA: Diagnosis not present

## 2014-04-25 DIAGNOSIS — R351 Nocturia: Secondary | ICD-10-CM | POA: Diagnosis not present

## 2014-04-27 ENCOUNTER — Ambulatory Visit (HOSPITAL_COMMUNITY): Payer: Medicare Other | Attending: Cardiology | Admitting: *Deleted

## 2014-04-27 DIAGNOSIS — I2510 Atherosclerotic heart disease of native coronary artery without angina pectoris: Secondary | ICD-10-CM | POA: Diagnosis not present

## 2014-04-27 DIAGNOSIS — I10 Essential (primary) hypertension: Secondary | ICD-10-CM | POA: Insufficient documentation

## 2014-04-27 DIAGNOSIS — E785 Hyperlipidemia, unspecified: Secondary | ICD-10-CM | POA: Diagnosis not present

## 2014-04-27 DIAGNOSIS — Z87891 Personal history of nicotine dependence: Secondary | ICD-10-CM | POA: Insufficient documentation

## 2014-04-27 DIAGNOSIS — E119 Type 2 diabetes mellitus without complications: Secondary | ICD-10-CM | POA: Insufficient documentation

## 2014-04-27 DIAGNOSIS — J449 Chronic obstructive pulmonary disease, unspecified: Secondary | ICD-10-CM | POA: Diagnosis not present

## 2014-04-27 DIAGNOSIS — I6529 Occlusion and stenosis of unspecified carotid artery: Secondary | ICD-10-CM | POA: Diagnosis not present

## 2014-04-27 NOTE — Progress Notes (Signed)
Carotid Duplex Perfomed

## 2014-05-01 ENCOUNTER — Telehealth: Payer: Self-pay

## 2014-05-01 DIAGNOSIS — I6523 Occlusion and stenosis of bilateral carotid arteries: Secondary | ICD-10-CM

## 2014-05-01 NOTE — Telephone Encounter (Signed)
-----   Message from Belva Crome III, MD sent at 04/30/2014  5:35 PM EST ----- Right carotid is less than 39% left carotid is less than 59%. His follow-up in one year

## 2014-05-01 NOTE — Telephone Encounter (Signed)
Pt aware of carotid results. Right carotid is less than 39% left carotid is less than 59%.     follow-up in one year    Pt verbalized understanding.

## 2014-05-02 ENCOUNTER — Encounter: Payer: Self-pay | Admitting: Internal Medicine

## 2014-05-02 DIAGNOSIS — I495 Sick sinus syndrome: Secondary | ICD-10-CM

## 2014-05-02 DIAGNOSIS — Z950 Presence of cardiac pacemaker: Secondary | ICD-10-CM | POA: Diagnosis not present

## 2014-05-09 ENCOUNTER — Encounter: Payer: Self-pay | Admitting: Internal Medicine

## 2014-05-09 ENCOUNTER — Ambulatory Visit (INDEPENDENT_AMBULATORY_CARE_PROVIDER_SITE_OTHER): Payer: Medicare Other | Admitting: Internal Medicine

## 2014-05-09 VITALS — BP 132/60 | HR 69 | Ht 66.0 in | Wt 224.0 lb

## 2014-05-09 DIAGNOSIS — I2510 Atherosclerotic heart disease of native coronary artery without angina pectoris: Secondary | ICD-10-CM

## 2014-05-09 DIAGNOSIS — G4733 Obstructive sleep apnea (adult) (pediatric): Secondary | ICD-10-CM

## 2014-05-09 DIAGNOSIS — J449 Chronic obstructive pulmonary disease, unspecified: Secondary | ICD-10-CM

## 2014-05-09 MED ORDER — AMOXICILLIN-POT CLAVULANATE 875-125 MG PO TABS: 1.0000 | ORAL_TABLET | Freq: Two times a day (BID) | ORAL | Status: DC

## 2014-05-09 MED ORDER — FLUTICASONE FUROATE-VILANTEROL 200-25 MCG/INH IN AEPB: 1.0000 | INHALATION_SPRAY | Freq: Every day | RESPIRATORY_TRACT | Status: DC

## 2014-05-09 NOTE — Progress Notes (Signed)
04/10/11- 80 year old male former smoker, Veteran, who had been a patient of mine in the old [employer] [facility]. He is coming to reestablish after moving back from [place] to [place]. History of asthma, COPD, obstructive sleep apnea complicated by DM, obesity, CAD/ischemic CM/sick sinus/bradycardia tachycardia with pacemaker, history of prostate cancer, anemia. He reports feeling stable today. New carpet smell in his apartment is irritating to his breathing but he denies routine cough or recent bronchitis. He has not felt the need to use his bronchodilators in 4 months. Walking distances limited more by degenerative joint hip pain. He can walk a maximum of 10 minutes on level ground and very little on hills or stairs. He denies blood, adenopathy, discolored sputum or swelling. He usually has trace ankle edema with no history of venous thromboembolic disease. CAD treated with CABG, stent and pacemaker but he has not had infarction. Did have pneumonia at age 62. He declines flu vaccine. Has had pneumonia vaccine. Long-term diagnosis of sleep apnea. NPSG 01/27/06-AHI 59.9 per hour. CPAP 12 plus oxygen 2 L for sleep, with good compliance and control. He will want to change to a local provider.  07/14/11- 80 year old male former smoker, Veteran,  History of asthma, COPD, obstructive sleep apnea complicated by DM, obesity, CAD/ischemic CM/sick sinus/bradycardia tachycardia with pacemaker, history of prostate cancer, anemia He had his first flu vaccine in 10 years this winter and avoided any significant respiratory infection. He no longer thinks he needs daytime oxygen. He is wearing it only at night, with his CPAP. Scheduled for right hip replacement in March. Chest x-ray 04/15/2011-no acute process, pacemaker PFT 04/23/2011-mild COPD  08/14/11-  80 year old male former smoker, Veteran,  History of asthma, COPD, obstructive sleep apnea complicated by DM, obesity, CAD/ischemic CM/sick sinus/bradycardia tachycardia/ pacemaker,  history of prostate cancer, anemia He is pending hip replacement by Dr. Tonita Cong on March 15. Asks prior authorization. I explained his increased risk of cardiopulmonary complications from any surgery and recognition that he is probably stable and near his best function now for necessary surgery. He is trying to move from his current apartment to a smoke free facility. A neighbor smokes heavily and the tobacco odor comes into the air conditioning ducts. He continues good compliance and symptom control using CPAP 12 with O2 2 L every night. He will use those settings while he is at the hospital for sleep and in recovery after extubation.  10/26/11- 80 year old male former smoker, Veteran,  History of asthma, COPD, obstructive sleep apnea complicated by DM, obesity, CAD/ischemic CM/sick sinus/bradycardia tachycardia/ pacemaker, history of prostate cancer, anemia Pt states having a productive cough  yellowish brown ,,increase sob wheezing ,chest congestion, cold chills .  Had right total hip replacement and finished physical therapy. Pain still limits walking. No respiratory complications. Now for 3 days had increased cough, yellow sputum, shortness of breath, wheeze, cold chills. Continued reflux symptoms on Nexium once daily. Discussion of steroid therapy and side effects.  11/05/11- 80 year old male former smoker, Veteran,  History of asthma, COPD, obstructive sleep apnea complicated by DM, obesity, CAD/ischemic CM/sick sinus/bradycardia tachycardia/ pacemaker, history of prostate cancer, anemia ACUTE VISIT: SOB and cough-productive yellow in color.Would like Zpak. Seen 10 days ago and Depo-Medrol and Z-Pak did help at that visit. Comfortable with CPAP. He takes it off to cough.  01/04/12- 80 year old male former smoker, Veteran,  History of asthma, COPD, obstructive sleep apnea complicated by DM, obesity, CAD/ischemic CM/sick sinus/bradycardia tachycardia/ pacemaker, history of prostate cancer,  anemia. Needs recert for O2 at night per  [facility]; pt states he feels like he needs O2 during the day as well. He says he sleeps and feels better when  using oxygen. Continue CPAP 12/Advanced, with oxygen at 2 L currently. COPD assessment test (CAT) 31/40.  03/07/12- 80 year old male former smoker, Veteran,  History of asthma, COPD, obstructive sleep apnea complicated by DM, obesity, CAD/ischemic CM/sick sinus/bradycardia tachycardia/ pacemaker, history of prostate cancer, anemia. Has good and bad days; able to move through the home with O2 (3L) on. Has had flu vaccine He qualified for home oxygen and uses it 90% of the time. Needs to wear it to clean his apartment. CPAP 12 "can't sleep without it". Walks laps in parking lot for exercise.  09/13/12- 89-year-old male former smoker, Veteran,  History of asthma, COPD, obstructive sleep apnea complicated by DM, obesity, CAD/ischemic CM/sick sinus/bradycardia tachycardia/ pacemaker, history of prostate cancer, anemia. Follows For:  Requalify for O2 for [facility] -  Pt doesnt feel like he needs O2 - SOB with increased housework activity - PT had fall 2 weeks ago and hurt back - Scheduled for CT this week Isn't sure he needs portable oxygen but does have concentrator in his house for sleep and as needed. He can't sleep without CPAP 12/ APS all night, every night, O2 2L/ Advanced. CXR 11/05/11 IMPRESSION:  Bibasilar bronchiectasis with new associated nodular air space  disease, indicative of an infectious bronchiolitis or  bronchopneumonia.  Original Report Authenticated By: Luretha Rued, M.D.  03/16/13-  80 year old male former smoker, Veteran,  History of asthma, COPD, obstructive sleep apnea complicated by DM, obesity, CAD/ischemic CM/sick sinus/bradycardia tachycardia/ pacemaker, history of prostate cancer, anemia. CPAP 12 / O2 2L/ APS sleep. Would like not to need O2 for exertion.  needs o2 recertified.  Breathing is unchanged.  Wearing cpap every night.  No  problems with mask or pressure.   Dizzy if he stands quickly and questions blood pressure medicines. Sees Dr Asa Lente tomorrow. CXR 02/02/13 IMPRESSION:  No acute abnormalities.  Original Report Authenticated By: Lorriane Shire, M.D. Exercise oximetry 03/16/13- rest RA 93%, walking RA 87%, Walking 2L O2 98%.   09/14/13-  80 year old male former smoker, Veteran,  History of asthma, COPD, obstructive sleep apnea complicated by DM, obesity, CAD/ischemic CM/sick sinus/bradycardia tachycardia/ pacemaker, history of prostate cancer, anemia. CPAP 12 / O2 2L/ Advanced sleep. FOLLOWS FOR: back pain continues to make it hard to breath. Otherwise continues to wear O2 during day on pulsing and O2 concentrator at night. Desat on room air at rest today to 88%. CXR 07/17/13 IMPRESSION:  Sequential pacemaker in place. Post CABG. Cardiomegaly.  Calcified tortuous aorta.  Mild central pulmonary vascular prominence stable.  Basilar scarring/subsegmental atelectasis without segmental  consolidation.  Electronically Signed  By: Chauncey Cruel M.D.  On: 07/17/2013 16:32  11/07/13- 80 year old male former smoker, Veteran,  History of asthma, COPD, obstructive sleep apnea complicated by DM, obesity, CAD/ischemic CM/sick sinus/bradycardia tachycardia/ pacemaker, history of prostate cancer, anemia. CPAP 12 / O2 2L/ Advanced sleep. Dyspnea on exertion with pulse oxygen, okay at rest. He worries about being allergic to bushes around his home because he has a "bitter taste" in his mouth. Neighbor upstairs smokes and the odor bothers patient. Continues CPAP all night every night and believes it helps him Walk test on oxygen- 11/07/13- 3 laps x 185 feet, lowest sat 91 % on 2L pulsed O2  01/18/14- 80 year old male former smoker, Veteran,  History of asthma, COPD, obstructive sleep apnea complicated by DM, obesity, CAD/ischemic CM/sick sinus/bradycardia tachycardia/ pacemaker,  history of prostate cancer, anemia. CPAP 12 / O2 2L/  Advanced sleep. FOLLOWS FOR:  acute visit today.  sore throat x 2-3 weeks.  pt stated that when he uses his inhalers he is having burning in his chest and wheezing at times with the inhaler use.   05/09/14-80 year old male former smoker, Veteran,  History of asthma, COPD, obstructive sleep apnea complicated by DM, obesity, CAD/ischemic CM/sick sinus/bradycardia tachycardia/ pacemaker, history of prostate cancer, anemia. CPAP 12 / O2 2L/ Advanced sleep. FOLLOWS FOR: been to UC and given Doxycycline-completed and no better. Continues to have cough-yellow in color with chest congestion and wheezing. He liked [employer] but questions insurance coverage. Has had worse bronchitis for 3 months-cough with yellow mucus, wheeze, chest congestion. Comes and goes. No better after doxycycline. Also asks small portable oxygen concentrator instead of the portable oxygen system he is now using. CXR 02/09/14 IMPRESSION: Stable chronic findings with no change from 07/17/2013. Electronically Signed  By: Skipper Cliche M.D.  On: 02/09/2014 10:01  ROS-see HPI Constitutional:   No-   weight loss, night sweats, fevers, chills, fatigue, lassitude. HEENT:   No-  headaches, difficulty swallowing, tooth/dental problems, sore throat,       No-  sneezing, itching, ear ache, nasal congestion, post nasal drip,  CV:  No- recent  chest pain, orthopnea, PND. +Mild chronic swelling in lower extremities,                            No- anasarca, dizziness, palpitations Resp: +  shortness of breath with exertion or at rest.              + productive cough,  + non-productive cough,  No- coughing up of blood.              + change in color of mucus.  No- wheezing.   Skin: No-   rash or lesions. GI:  No-   heartburn, indigestion, abdominal pain, nausea, vomiting,  GU:  MS: . Limiting hip pain Neuro-     nothing unusual Psych:  No- change in mood or affect. No depression or anxiety.  No memory loss.  OBJ General- Alert,  Oriented, Affect-appropriate, Distress- none acute, overweight, O2 2 L Skin- rash-none, lesions- none, excoriation- none Lymphadenopathy- none Head- atraumatic            Eyes- Gross vision intact, PERRLA, conjunctivae clear secretions            Ears- Hearing aids, HOH            Nose- Clear, no-Septal dev, mucus, polyps, erosion, perforation             Throat- Mallampati II , mucosa clear , drainage- none, tonsils- atrophic, +                                     dentures                     Neck- flexible , trachea midline, no stridor , thyroid nl, carotid no bruit Chest - symmetrical excursion , unlabored           Heart/CV- RRR +bigeminal pulse , no murmur , no gallop  , no rub, nl s1 s2                           -  JVD- none , edema- none, stasis changes- none, varices- none           Lung- +few rhonchi, unlabored, wheeze- none, + deep,   dullness-none, rub-                           none. Raspy .           Chest wall- pacemaker left chest Abd- Br/ Gen/ Rectal- Not done, not indicated Extrem- cyanosis- none, clubbing, none, atrophy- none, strength- deconditioned, +cane Neuro- grossly intact to observation

## 2014-05-09 NOTE — Patient Instructions (Addendum)
Script sent for augmentin antibiotitc  Script printed for [employer] inhaler, to use 1 puff then rinse mouth, once daily. This is to help with your Asthma with COPD(chronic bronchitis). You can see if the [facility] can provide it for you.   Order- DME [facility]- Evaluate for portable O2 concentrator for dx asthma with COPD to replace current portable O2 system

## 2014-05-10 NOTE — Assessment & Plan Note (Signed)
Discussed reflux precautions. Continue elevation and head of bed and use of twice daily acid blocker

## 2014-05-10 NOTE — Assessment & Plan Note (Signed)
There is a persistent wheezy bronchitis quality noted especially if he laughs. I would like to try again with a maintenance inhaler. Plan-sample Breo

## 2014-05-10 NOTE — Assessment & Plan Note (Signed)
Continues good compliance control. He is having discussions with DME about mask fit

## 2014-05-10 NOTE — Assessment & Plan Note (Signed)
Good compliance and control with no apparent change needed. We discussed comfort issues

## 2014-05-10 NOTE — Assessment & Plan Note (Addendum)
Sustained exacerbation of bronchitis component There is a chronic respiratory failure/hypoxia component Plan-Augmentin, Rx Breo Ellipta, discussed portable oxygen concentrator with DME

## 2014-05-14 ENCOUNTER — Telehealth: Payer: Self-pay | Admitting: Internal Medicine

## 2014-05-14 NOTE — Telephone Encounter (Signed)
Called spoke with pt. He reports [facility] is [facility] a letter from Dr. Annamaria Boots stating why pt needs the breo and how important this is for the pt to use the inhaler. He needs this faxed [phone] attn: Dr. Theone Murdoch. Please advise Dr. Annamaria Boots thanks

## 2014-05-17 NOTE — Telephone Encounter (Signed)
LMTCB

## 2014-05-17 NOTE — Telephone Encounter (Signed)
If the [facility] doesn't cover Breo, then we will substitute Advair, Dulera or Symbicort. These are similar medicines. We just need to know which the [facility] covers so we can change his prescription.

## 2014-05-18 NOTE — Telephone Encounter (Signed)
I spoke with the pt and gave him the alternatives. He states he will check with them to see which one they cover. He has some breo at this time and wants to use it up before changing. He will call when he has the info. [place] Bing, CMA

## 2014-05-23 ENCOUNTER — Encounter: Payer: Self-pay | Admitting: Internal Medicine

## 2014-05-23 ENCOUNTER — Ambulatory Visit (INDEPENDENT_AMBULATORY_CARE_PROVIDER_SITE_OTHER): Payer: Medicare Other | Admitting: Internal Medicine

## 2014-05-23 VITALS — BP 136/74 | HR 62 | Temp 98.0°F | Ht 66.0 in | Wt 220.2 lb

## 2014-05-23 DIAGNOSIS — E119 Type 2 diabetes mellitus without complications: Secondary | ICD-10-CM

## 2014-05-23 DIAGNOSIS — K219 Gastro-esophageal reflux disease without esophagitis: Secondary | ICD-10-CM | POA: Diagnosis not present

## 2014-05-23 DIAGNOSIS — Z0000 Encounter for general adult medical examination without abnormal findings: Secondary | ICD-10-CM | POA: Diagnosis not present

## 2014-05-23 DIAGNOSIS — H02055 Trichiasis without entropian left lower eyelid: Secondary | ICD-10-CM | POA: Diagnosis not present

## 2014-05-23 DIAGNOSIS — E785 Hyperlipidemia, unspecified: Secondary | ICD-10-CM | POA: Diagnosis not present

## 2014-05-23 DIAGNOSIS — Z23 Encounter for immunization: Secondary | ICD-10-CM | POA: Diagnosis not present

## 2014-05-23 DIAGNOSIS — I2510 Atherosclerotic heart disease of native coronary artery without angina pectoris: Secondary | ICD-10-CM | POA: Diagnosis not present

## 2014-05-23 DIAGNOSIS — J449 Chronic obstructive pulmonary disease, unspecified: Secondary | ICD-10-CM | POA: Diagnosis not present

## 2014-05-23 DIAGNOSIS — H02051 Trichiasis without entropian right upper eyelid: Secondary | ICD-10-CM | POA: Diagnosis not present

## 2014-05-23 DIAGNOSIS — H02054 Trichiasis without entropian left upper eyelid: Secondary | ICD-10-CM | POA: Diagnosis not present

## 2014-05-23 MED ORDER — RANITIDINE HCL 150 MG PO TABS: 150.0000 mg | ORAL_TABLET | Freq: Two times a day (BID) | ORAL | Status: DC

## 2014-05-23 NOTE — Assessment & Plan Note (Signed)
s/p DES 08/2010, hx same and CABG 1995 On statin - last lipids at goal, recheck annually at [facility] (04/11/14 abstracted today) The current medical regimen is effective;  continue present plan and medications.

## 2014-05-23 NOTE — Assessment & Plan Note (Signed)
Reports unintentional loss of weight with "sour" backwash following any intake, and hoarseness -different than prior "GERD" symptoms - Takes nexium 2x/week prn Will add zantac daily and refer to GI for further eval - last EGD 07/17/03 with HH and gastritis reviewed

## 2014-05-23 NOTE — Progress Notes (Signed)
Pre visit review using our clinic review tool, if applicable. No additional management support is needed unless otherwise documented below in the visit note. 

## 2014-05-23 NOTE — Progress Notes (Signed)
Subjective:    Patient ID: Eric [name], male    DOB: 1927/01/13, 80 y.o.   MRN: [id number]  HPI   Here for medicare wellness  Diet: heart healthy, diabetic Physical activity: sedentary Depression/mood screen: negative Hearing: intact to whispered voice Visual acuity: grossly normal, performs annual eye exam  ADLs: capable Fall risk: none Home safety: good Cognitive evaluation: intact to orientation, naming, recall and repetition EOL planning: adv directives, full code/ I agree  I have personally reviewed and have noted 1. The patient's medical and social history 2. Their use of alcohol, tobacco or illicit drugs 3. Their current medications and supplements 4. The patient's functional ability including ADL's, fall risks, home safety risks and hearing or visual impairment. 5. Diet and physical activities 6. Evidence for depression or mood disorders  Also reviewed chronic medical issues and interval medical events  Past Medical History  Diagnosis Date  . LACTOSE INTOLERANCE   . OBESITY   . CARDIOMYOPATHY, ISCHEMIC   . AORTIC SCLEROSIS   . SICK SINUS/ TACHY-BRADY SYNDROME 09/2007    s/p PPM st judes  . PERIPHERAL VASCULAR DISEASE   . CAROTID BRUIT, RIGHT 02/27/2008  . IBS (irritable bowel syndrome)   . ALLERGIC RHINITIS   . ANEMIA-NOS   . OA (osteoarthritis)   . COPD   . GERD   . HYPERLIPIDEMIA   . HIATAL HERNIA   . Diverticulosis   . Prostate cancer     seed implants 2004  . DIABETES MELLITUS-TYPE II     diet controlled  . CORONARY ARTERY DISEASE     CABG 1995, PTCA/DES 2008, 2009 and 08/2010  . HYPERTENSION   . Asthma   . Sleep apnea   . Partial small bowel obstruction   . Primary hyperparathyroidism     Lab Results Component Value Date  PTH 150.7* 02/13/2013  CALCIUM 11.0* 02/13/2013  CAION 1.21 03/15/2008    . SMALL BOWEL OBSTRUCTION 04/18/2009    Qualifier: History of  By: Asa Lente MD, Jannifer Rodney Cataract     surgery  . Symptomatic diverticulosis  01/18/2009    Qualifier: Diagnosis of  By: Trellis Paganini PA-c, Amy S   . Hx of echocardiogram     Echo (9/15):  Mild LVH, EF 50-55%, no RWMA, Gr 1 DD, MAC, mild LAE.   Family History  Problem Relation Age of Onset  . Colon cancer Neg Hx   . Hypertension Mother   . Heart attack    . Heart attack    . Stroke Neg Hx   . Heart attack Brother   . Cancer Mother    History  Substance Use Topics  . Smoking status: Former Smoker    Quit date: 06/09/1995  . Smokeless tobacco: Never Used  . Alcohol Use: No    Review of Systems  Constitutional: Negative for fever, activity change, appetite change, fatigue and unexpected weight change.  HENT: Positive for trouble swallowing (end of day hoarseness and "sour" wash - "no help from ENT on this"). Negative for postnasal drip and sinus pressure.   Respiratory: Positive for shortness of breath. Negative for cough, chest tightness and wheezing.   Cardiovascular: Negative for chest pain, palpitations and leg swelling.  Neurological: Negative for dizziness, weakness and headaches.  Psychiatric/Behavioral: Negative for dysphoric mood. The patient is not nervous/anxious.   All other systems reviewed and are negative.      Objective:   Physical Exam  BP 136/74 mmHg  Pulse 62  Temp(Src) 98  F (36.7 C) (Oral)  Ht 5\' 6"  (1.676 m)  Wt 220 lb 4 oz (99.905 kg)  BMI 35.57 kg/m2  SpO2 94% Wt Readings from Last 3 Encounters:  05/23/14 220 lb 4 oz (99.905 kg)  05/09/14 224 lb (101.606 kg)  04/04/14 226 lb (102.513 kg)   Constitutional: he is obese, appears well-developed and well-nourished. No distress.  Neck: Normal range of motion. Neck supple. No JVD present. No thyromegaly present.  Cardiovascular: Normal rate, regular rhythm and normal heart sounds.  No murmur heard. No BLE edema. Pulmonary/Chest: Effort normal and breath sounds normal. No respiratory distress. he has no wheezes.  Psychiatric: he has a normal mood and affect. His behavior is  normal. Judgment and thought content normal.   Lab Results  Component Value Date   WBC 7.7 04/11/2014   HGB 13.8 04/11/2014   HCT 41 04/11/2014   PLT 176 04/11/2014   GLUCOSE 116* 02/09/2014   CHOL 89 04/11/2014   TRIG 93 04/11/2014   HDL 40 04/11/2014   LDLCALC 30 04/11/2014   ALT 33 04/11/2014   AST 25 04/11/2014   NA 135* 04/11/2014   K 4.4 04/11/2014   CL 106 02/09/2014   CREATININE 1.0 04/11/2014   BUN 11 04/11/2014   CO2 23 02/09/2014   TSH 0.93 04/11/2014   PSA 0.02* 09/10/2006   INR 1.00 08/13/2011   HGBA1C 6.9* 04/11/2014   MICROALBUR 0.8 09/30/2012    Dg Forearm Right  04/14/2014   CLINICAL DATA:  Fall, right arm pain and trauma  EXAM: RIGHT FOREARM - 2 VIEW  COMPARISON:  Hand radiographs same date, dictated separately  FINDINGS: There is no evidence of fracture or other focal bone lesions. Soft tissues are unremarkable. Bandage material obscures some soft tissue detail over the distal forearm, with possible dorsal soft tissue swelling.  IMPRESSION: No acute osseous finding of the forearm.   Electronically Signed   By: Conchita Paris M.D.   On: 04/14/2014 11:29   Dg Hand Complete Right  04/14/2014   CLINICAL DATA:  Fall, and trauma and pain  EXAM: RIGHT HAND - COMPLETE 3+ VIEW  COMPARISON:  Forearm radiographs same date  FINDINGS: There is no evidence of fracture or dislocation. There is no evidence of arthropathy or other focal bone abnormality. Soft tissues are unremarkable. Material obscures detail the soft tissues of the distal forearm, with suggestion of underlying dorsal soft tissue swelling. Mild DIP osteoarthritic degenerative change.  IMPRESSION: Negative.   Electronically Signed   By: Conchita Paris M.D.   On: 04/14/2014 11:30       Assessment & Plan:   AWV/z00.00 - Today patient counseled on age appropriate routine health concerns for screening and prevention, each reviewed and up to date or declined. Immunizations reviewed and up to date or declined. Labs  reviewed. Risk factors for depression reviewed and negative. Hearing function and visual acuity are intact. ADLs screened and addressed as needed. Functional ability and level of safety reviewed and appropriate. Education, counseling and referrals performed based on assessed risks today. Patient provided with a copy of personalized plan for preventive services.  Problem List Items Addressed This Visit    COPD mixed type    Working with Annamaria Boots (pulm) for same Summer 2015 started on inhaled steroid - not feeling that symptoms have improved Pt dislikes nebs "too strong" and feels [place] O2 causes "sore throat and dryness" despite humidified air at home Interval reviewed Stopped Breo because of cost and side effects (as rx'd  05/10/14), requests Symbicort on formulary - will erx same now follow up pulm as ongoing     Dyslipidemia    s/p DES 08/2010, hx same and CABG 1995 On statin - last lipids at goal, recheck annually at [facility] (04/11/14 abstracted today) The current medical regimen is effective;  continue present plan and medications.    GERD (gastroesophageal reflux disease)    Reports unintentional loss of weight with "sour" backwash following any intake, and hoarseness -different than prior "GERD" symptoms - Takes nexium 2x/week prn Will add zantac daily and refer to GI for further eval - last EGD 07/17/03 with [place] and gastritis reviewed    Relevant Medications      ranitidine (ZANTAC) tablet   Other Relevant Orders      Ambulatory referral to Gastroenterology   Type 2 diabetes mellitus, controlled    "diet controlled" no longer follows with endo (prev kerr, kumar) -last [facility] at goal (6.9 on 04/11/14) actos caused edema/fluid retention and januvia stopped summer 2014 (?cost) Intolerant of metformin due to nausea and "hospitalization" The patient is asked to make an attempt to improve diet and exercise patterns to aid in medical management of this problem.   Lab Results  Component Value Date    HGBA1C 7.0* 12/20/2013       Other Visit Diagnoses    Routine general medical examination at a health care facility    -  Primary

## 2014-05-23 NOTE — Patient Instructions (Signed)
It was good to see you today.  We have reviewed your prior records including labs and tests today  Prevnar, pneumonia vaccine #2, administered today  Other Health Maintenance reviewed - all recommended immunizations and age-appropriate screenings are up-to-date.  Medications reviewed and updated Stop Breo, start Symbicort Begin Zantac 150 mg twice daily for 2 weeks to treat sour taste and hoarseness No other changes recommended at this time.  Your prescription(s) have been submitted to your pharmacy. Please take as directed and contact our office if you believe you are having problem(s) with the medication(s).  we'll make referral to Dr. Carlean Purl for evaluation of your sour taste and GERD. Our office will contact you regarding appointment(s) once made.  Please schedule followup in 6 months for semiannual exam and review, call sooner if problems.

## 2014-05-23 NOTE — Assessment & Plan Note (Signed)
"  diet controlled" no longer follows with endo (prev kerr, kumar) -last [facility] labs at goal (6.9 on 04/11/14) actos caused edema/fluid retention and januvia stopped summer 2014 (?cost) Intolerant of metformin due to nausea and "hospitalization" The patient is asked to make an attempt to improve diet and exercise patterns to aid in medical management of this problem.   Lab Results  Component Value Date   HGBA1C 7.0* 12/20/2013

## 2014-05-23 NOTE — Assessment & Plan Note (Addendum)
Working with Annamaria Boots (pulm) for same Summer 2015 started on inhaled steroid - not feeling that symptoms have improved Pt dislikes nebs "too strong" and feels [place] O2 causes "sore throat and dryness" despite humidified air at home Interval reviewed Stopped Breo because of cost and side effects (as rx'd 05/10/14), requests Symbicort on formulary - will erx same now follow up pulm as ongoing

## 2014-05-24 ENCOUNTER — Telehealth: Payer: Self-pay | Admitting: Internal Medicine

## 2014-05-24 NOTE — Telephone Encounter (Signed)
emmi mailed  °

## 2014-06-13 ENCOUNTER — Encounter: Payer: Self-pay | Admitting: Internal Medicine

## 2014-06-22 DIAGNOSIS — N4889 Other specified disorders of penis: Secondary | ICD-10-CM | POA: Diagnosis not present

## 2014-06-25 ENCOUNTER — Telehealth: Payer: Self-pay | Admitting: Interventional Cardiology

## 2014-06-25 NOTE — Telephone Encounter (Signed)
Routed to Dr.Smith for approval 

## 2014-06-25 NOTE — Telephone Encounter (Signed)
New Msg      Request for surgical clearance:  1. What type of surgery is being performed? Injection in his back   2. When is this surgery scheduled? 07/04/14   3. Are there any medications that need to be held prior to surgery and how long?  Plavix for five days before Surgery/ Procedure  4. Name of physician performing surgery?  Dr. Suella Broad    5. What is your office phone and fax number?  Office phn [phone] ext [phone]  Deana calling from [place] and may be called if any questions.  6.

## 2014-06-27 NOTE — Telephone Encounter (Signed)
Ok to hold plavix for 5-7 days.

## 2014-06-28 ENCOUNTER — Telehealth: Payer: Self-pay | Admitting: Internal Medicine

## 2014-06-28 DIAGNOSIS — Z8546 Personal history of malignant neoplasm of prostate: Secondary | ICD-10-CM | POA: Diagnosis not present

## 2014-06-28 DIAGNOSIS — R351 Nocturia: Secondary | ICD-10-CM | POA: Diagnosis not present

## 2014-06-28 MED ORDER — DOXYCYCLINE HYCLATE 100 MG PO TABS: ORAL_TABLET | ORAL | Status: DC

## 2014-06-28 NOTE — Telephone Encounter (Signed)
Called pt to advise him on instructions to hold Plavix 5-7 days prior to his injection.  Pt verbalized understanding and was in agreement with this plan.  Pt said he had planned to stop taking it today anyway. Attempted to call Deana back but office was already closed.  Left a voicemail letting her know that Dr. Tamala Julian said it was ok to hold Plavix for 5-7 days.  Also, left on voicemail that I had contacted the pt to make him aware of these instructions.

## 2014-06-28 NOTE — Telephone Encounter (Signed)
Called pt and is aware of recs. RX sent in. Nothing further needed 

## 2014-06-28 NOTE — Telephone Encounter (Signed)
Called spoke with pt. She reports [facility] not [facility] breo. He advised them the alternatives but the [facility] told him we needed to send them something. Pt reports he has been doing fine without the breo. He wants to know should we leave him off of inhaler for now? Also c/o prod cough yellow phlem. Reports when he is on ABX he does fine for few days then starts coughing this up again. Please advise CDY thanks  Allergies  Allergen Reactions  . Actos [Pioglitazone Hydrochloride] Other (See Comments)    "felt funny, drowsy, and weak":  . Celebrex [Celecoxib] Other (See Comments)    "felt funny"  . Demerol Palpitations and Other (See Comments)    Increased BP  . Morphine And Related Nausea And Vomiting  . Ciprofloxacin Other (See Comments)    arthralgia  . Metformin Nausea And Vomiting  . Zocor [Simvastatin] Other (See Comments)    Makes pt very drowsy     Current Outpatient Prescriptions on File Prior to Visit  Medication Sig Dispense Refill  . albuterol (PROVENTIL HFA) 108 (90 BASE) MCG/ACT inhaler Inhale 2 puffs into the lungs every 4 (four) hours as needed. Wheezing     . albuterol (PROVENTIL) (2.5 MG/3ML) 0.083% nebulizer solution USE 1 VIAL WITH NEBULIZER EVERY 4 HOURS AS NEEDED FOR WHEEZING 75 mL 2  . aluminum & magnesium hydroxide (MAALOX) 225-200 MG/5ML suspension Take 15 mLs by mouth every 6 (six) hours as needed. Heart burn     . aspirin EC 81 MG tablet Take 1 tablet (81 mg total) by mouth daily. 150 tablet 2  . clopidogrel (PLAVIX) 75 MG tablet Take 75 mg by mouth daily after breakfast.     . esomeprazole (NEXIUM) 40 MG capsule Take 40 mg by mouth 2 (two) times daily.    . fluticasone (FLONASE) 50 MCG/ACT nasal spray Place 2 sprays into the nose daily. Allergies 16 g 1  . HYDROcodone-acetaminophen (NORCO) 7.5-325 MG per tablet Take 1-2 tablets by mouth every 4 (four) hours as needed.    . isosorbide mononitrate (IMDUR) 30 MG 24 hr tablet Take 1 tablet (30 mg total) by mouth every  evening. 30 tablet 11  . Niacin CR 1000 MG TBCR Take 1,000 mg by mouth at bedtime.     . nitroGLYCERIN (NITROSTAT) 0.4 MG SL tablet Place 0.4 mg under the tongue every 5 (five) minutes as needed. Chest pain     . Polyethyl Glycol-Propyl Glycol (SYSTANE) 0.4-0.3 % SOLN Apply 2 drops to eye at bedtime.    . pravastatin (PRAVACHOL) 20 MG tablet Take 60 mg by mouth at bedtime.     . ranitidine (ZANTAC) 150 MG tablet Take 1 tablet (150 mg total) by mouth 2 (two) times daily. 60 tablet 1  . senna (SENOKOT) 8.6 MG tablet Alternates with one tablet one day then two tablets the next day    . valsartan (DIOVAN) 160 MG tablet Take 1 tablet (160 mg total) by mouth 2 (two) times daily.     No current facility-administered medications on file prior to visit.

## 2014-06-28 NOTE — Telephone Encounter (Signed)
Suggest he stay off Breo , just use his albuterol inhaler and neb if needed. We will see how it goes. Offer Rx doxycycline 100 mg, # 10   2 today then one daily, refill x 5

## 2014-07-02 ENCOUNTER — Telehealth: Payer: Self-pay | Admitting: Interventional Cardiology

## 2014-07-02 NOTE — Telephone Encounter (Signed)
Returned call to [place] @ [place]. She rqst in writing ok from Dr.Smith to hold Plavix 5-7 days prior to back injection. Fax placed in nurse fax box in MR to be faxed to [phone]

## 2014-07-02 NOTE — Telephone Encounter (Signed)
New message     Returning a nurses call from thurs

## 2014-07-04 DIAGNOSIS — M47816 Spondylosis without myelopathy or radiculopathy, lumbar region: Secondary | ICD-10-CM | POA: Diagnosis not present

## 2014-07-06 DIAGNOSIS — D225 Melanocytic nevi of trunk: Secondary | ICD-10-CM | POA: Diagnosis not present

## 2014-07-06 DIAGNOSIS — L821 Other seborrheic keratosis: Secondary | ICD-10-CM | POA: Diagnosis not present

## 2014-07-06 DIAGNOSIS — L659 Nonscarring hair loss, unspecified: Secondary | ICD-10-CM | POA: Diagnosis not present

## 2014-07-11 ENCOUNTER — Ambulatory Visit (INDEPENDENT_AMBULATORY_CARE_PROVIDER_SITE_OTHER): Payer: Medicare Other | Admitting: Internal Medicine

## 2014-07-11 ENCOUNTER — Encounter: Payer: Self-pay | Admitting: Internal Medicine

## 2014-07-11 VITALS — BP 140/60 | HR 76 | Ht 65.5 in | Wt 214.4 lb

## 2014-07-11 DIAGNOSIS — K589 Irritable bowel syndrome without diarrhea: Secondary | ICD-10-CM

## 2014-07-11 DIAGNOSIS — R634 Abnormal weight loss: Secondary | ICD-10-CM

## 2014-07-11 DIAGNOSIS — K219 Gastro-esophageal reflux disease without esophagitis: Secondary | ICD-10-CM | POA: Diagnosis not present

## 2014-07-11 MED ORDER — METOCLOPRAMIDE HCL 5 MG PO TABS: 5.0000 mg | ORAL_TABLET | Freq: Every day | ORAL | Status: DC

## 2014-07-11 NOTE — Assessment & Plan Note (Addendum)
Wt Readings from Last 3 Encounters:  07/11/14 214 lb 6 oz (97.24 kg)  05/23/14 220 lb 4 oz (99.905 kg)  05/09/14 224 lb (101.606 kg)   Cause not clear to me ? Component of depression Do not think he needs any investigation at this time Last EGD was actually 2013 and just 2 cm hiatal hernia so would not repeat that  CT abd/pelvis 03/2014 w/o sig problems and recent lytes, CBC and TSH ok. Mildly elevated Hgb A1 c at 6.9% ? If there is a component of depression at play

## 2014-07-11 NOTE — Progress Notes (Signed)
   Subjective:    Patient ID: Eric [name], male    DOB: 1926/07/24, 81 y.o.   MRN: [id number] Cc: Reflux, constipation HPI Here with c/o Am burning of lips and tongue. No dysphagia. Sometimes things feel "gritty in there" - points to esophagus. He recently finished doxycycline for "infection" - coughing up yellow phlegm with COPD. He has HOB elevated. On Nexium Am and Zantac PM.  Constipation temporarily worse - ran out of senokot. What the [place] sent him isn't any good he says.  Medications, allergies, past medical history, past surgical history, family history and social history are reviewed and updated in the EMR.  Review of Systems Recent skin lesions frozen on face/head    Objective:   Physical Exam BP 140/60 mmHg  Pulse 76  Ht 5' 5.5" (1.664 m)  Wt 214 lb 6 oz (97.24 kg)  BMI 35.12 kg/m2 Elderly wm NAD Cor distant S1 and S2 Abd soft, mildly tender over diastasis recti Mood - slightly flat      Assessment & Plan:  GERD Having nocturnal and AM sxs - will add low-dose Reglan 5 mg qhs Side effects discussed and advised to call back if needed Doubt EGD would change anything Some of his very recent sxs may be doxycycline side effects RTC 3 months    Irritable bowel syndrome Resume Senokot - he is out   Loss of weight Wt Readings from Last 3 Encounters:  07/11/14 214 lb 6 oz (97.24 kg)  05/23/14 220 lb 4 oz (99.905 kg)  05/09/14 224 lb (101.606 kg)   Cause not clear to me ? Component of depression Do not think he needs any investigation at this time Last EGD was actually 2013 and just 2 cm hiatal hernia so would not repeat that  CT abd/pelvis 03/2014 w/o sig problems and recent lytes, CBC and TSH ok. Mildly elevated Hgb A1 c at 6.9% ? If there is a component of depression at play     OY:DXAJOIN Leschber, MD

## 2014-07-11 NOTE — Patient Instructions (Signed)
We have sent the following medications to your pharmacy for you to pick up at your convenience: Reglan  Follow up with [place] in 3 months.   I appreciate the opportunity to care for you. Silvano Rusk, M.D., [facility]

## 2014-07-11 NOTE — Assessment & Plan Note (Addendum)
Having nocturnal and AM sxs - will add low-dose Reglan 5 mg qhs Side effects discussed and advised to call back if needed Doubt EGD would change anything Some of his very recent sxs may be doxycycline side effects RTC 3 months

## 2014-07-11 NOTE — Assessment & Plan Note (Signed)
Resume Senokot - he is out

## 2014-07-16 ENCOUNTER — Telehealth: Payer: Self-pay

## 2014-07-16 ENCOUNTER — Ambulatory Visit (INDEPENDENT_AMBULATORY_CARE_PROVIDER_SITE_OTHER): Payer: Medicare Other | Admitting: Internal Medicine

## 2014-07-16 ENCOUNTER — Encounter: Payer: Self-pay | Admitting: Internal Medicine

## 2014-07-16 VITALS — BP 162/66 | HR 66 | Ht 66.0 in | Wt 216.6 lb

## 2014-07-16 DIAGNOSIS — I495 Sick sinus syndrome: Secondary | ICD-10-CM | POA: Diagnosis not present

## 2014-07-16 DIAGNOSIS — Z45018 Encounter for adjustment and management of other part of cardiac pacemaker: Secondary | ICD-10-CM

## 2014-07-16 DIAGNOSIS — I2510 Atherosclerotic heart disease of native coronary artery without angina pectoris: Secondary | ICD-10-CM

## 2014-07-16 DIAGNOSIS — Z79899 Other long term (current) drug therapy: Secondary | ICD-10-CM

## 2014-07-16 LAB — MDC_IDC_ENUM_SESS_TYPE_INCLINIC
Brady Statistic RA Percent Paced: 26 %
Date Time Interrogation Session: 20160208083713
Implantable Pulse Generator Model: 5826
Lead Channel Impedance Value: 363 Ohm
Lead Channel Impedance Value: 421 Ohm
Lead Channel Pacing Threshold Amplitude: 0.5 V
Lead Channel Pacing Threshold Amplitude: 0.875 V
Lead Channel Pacing Threshold Pulse Width: 0.4 ms
Lead Channel Pacing Threshold Pulse Width: 0.4 ms
Lead Channel Sensing Intrinsic Amplitude: 5 mV
Lead Channel Setting Pacing Amplitude: 2 V
Lead Channel Setting Sensing Sensitivity: 2 mV
MDC IDC MSMT BATTERY IMPEDANCE: 1000 Ohm
MDC IDC MSMT BATTERY VOLTAGE: 2.76 V
MDC IDC MSMT LEADCHNL RV SENSING INTR AMPL: 8.7 mV
MDC IDC PG SERIAL: 2094876
MDC IDC SET LEADCHNL RV PACING PULSEWIDTH: 0.4 ms
MDC IDC STAT BRADY RV PERCENT PACED: 1 %

## 2014-07-16 LAB — BASIC METABOLIC PANEL
BUN: 16 mg/dL (ref 6–23)
CO2: 26 mEq/L (ref 19–32)
CREATININE: 0.85 mg/dL (ref 0.40–1.50)
Calcium: 10.8 mg/dL — ABNORMAL HIGH (ref 8.4–10.5)
Chloride: 106 mEq/L (ref 96–112)
GFR: 90.99 mL/min (ref >60.00)
Glucose, Bld: 115 mg/dL — ABNORMAL HIGH (ref 70–99)
Potassium: 4.3 mEq/L (ref 3.5–5.1)
Sodium: 138 mEq/L (ref 135–145)

## 2014-07-16 MED ORDER — VALSARTAN 160 MG PO TABS: 160.0000 mg | ORAL_TABLET | Freq: Every day | ORAL | Status: DC

## 2014-07-16 MED ORDER — CHLORTHALIDONE 25 MG PO TABS: 12.5000 mg | ORAL_TABLET | Freq: Every day | ORAL | Status: DC

## 2014-07-16 NOTE — Telephone Encounter (Signed)
-----   Message from Sinclair Grooms, MD sent at 07/16/2014  9:02 AM EST ----- Lattie Haw have him stop Niacin. ----- Message -----    From: Deboraha Sprang, MD    Sent: 07/16/2014   8:53 AM      To: Sinclair Grooms, MD  Hank based on the newer data do you still want him on niacin Angelica Chessman him your office would let him know what you wanted him to do

## 2014-07-16 NOTE — Progress Notes (Signed)
Electrophysiology Office Note   Date:  07/16/2014   ID:  Eric [name], DOB 1926-07-16, MRN [id number]  PCP:  [name], MD  Cardiologist: [facility] Primary Electrophysiologist: [name]   [name], MD    Chief Complaint  Patient presents with  . Appointment    ROV     History of Present Illness: Eric [name] is a 81 y.o. male is seen today in followup for   Pacemaker implanted 2009 for sinus node dysfunction.  Hx of CAD with CABG Cath 2009 at which time vein graft to diagonal was stented. In 2012 he underwent stenting of his vein graft to the OM. LVEF 55% echo 9/15   Has not had peripheral edema.  Today, he denies symptoms of palpitations, chest pain, shortness of breath, orthopnea, PND, lower extremity edema, claudication, dizziness, presyncope, syncope, bleeding, or neurologic sequela.   The patient is tolerating medications without difficulties and is otherwise without complaint today.   BP at home has been `130-150  He continues to lose weight despite retained appetitie.  HgB and CT were unremarkable    Still on Niacin   Past Medical History  Diagnosis Date  . LACTOSE INTOLERANCE   . OBESITY   . CARDIOMYOPATHY, ISCHEMIC   . AORTIC SCLEROSIS   . SICK SINUS/ TACHY-BRADY SYNDROME 09/2007    s/p PPM st judes  . PERIPHERAL VASCULAR DISEASE   . CAROTID BRUIT, RIGHT 02/27/2008  . IBS (irritable bowel syndrome)   . ALLERGIC RHINITIS   . ANEMIA-NOS   . OA (osteoarthritis)   . COPD   . GERD   . HYPERLIPIDEMIA   . HIATAL HERNIA   . Diverticulosis   . Prostate cancer     seed implants 2004  . DIABETES MELLITUS-TYPE II     diet controlled  . CORONARY ARTERY DISEASE     CABG 1995, PTCA/DES 2008, 2009 and 08/2010  . HYPERTENSION   . Asthma   . Sleep apnea   . Partial small bowel obstruction   . Primary hyperparathyroidism     Lab Results Component Value Date  PTH 150.7* 02/13/2013  CALCIUM 11.0* 02/13/2013  CAION 1.21 03/15/2008    . SMALL BOWEL OBSTRUCTION  04/18/2009    Qualifier: History of  By: Asa Lente MD, Jannifer Rodney Cataract     surgery  . Symptomatic diverticulosis 01/18/2009    Qualifier: Diagnosis of  By: Trellis Paganini PA-c, Amy S   . Hx of echocardiogram     Echo (9/15):  Mild LVH, EF 50-55%, no RWMA, Gr 1 DD, MAC, mild LAE.   Past Surgical History  Procedure Laterality Date  . Ptca  2008, 2009, 2012    with DES  . Partial small bowel obstruction  2009  . Bilateral cataracts    . Pacemaker insertion      DDD/[employer]         Last interrogation 2/13  on chart     Pacemaker guideline order Dr Tamala Julian on chart  . Inguinal hernia repair Bilateral   . Lumbar disc surgery  12/2008  . Coronary artery bypass graft    . Total hip arthroplasty  08/21/2011    Procedure: TOTAL HIP ARTHROPLASTY;  Surgeon: Johnn Hai, MD;  Location: [facility];  Service: Orthopedics;  Laterality: Right;  . Colonoscopy    . Esophagogastroduodenoscopy  multiple  . Penile prosthesis placement    . Back surgery    . Colon surgery    . Flexible sigmoidoscopy  N/A 09/22/2013    Procedure: FLEXIBLE SIGMOIDOSCOPY;  Surgeon: Gatha Mayer, MD;  Location: [facility];  Service: Endoscopy;  Laterality: N/A;     Current Outpatient Prescriptions  Medication Sig Dispense Refill  . albuterol (PROVENTIL HFA) 108 (90 BASE) MCG/ACT inhaler Inhale 2 puffs into the lungs every 4 (four) hours as needed. Wheezing     . albuterol (PROVENTIL) (2.5 MG/3ML) 0.083% nebulizer solution USE 1 VIAL WITH NEBULIZER EVERY 4 HOURS AS NEEDED FOR WHEEZING 75 mL 2  . aspirin EC 81 MG tablet Take 1 tablet (81 mg total) by mouth daily. 150 tablet 2  . clopidogrel (PLAVIX) 75 MG tablet Take 75 mg by mouth daily after breakfast.     . fluticasone (FLONASE) 50 MCG/ACT nasal spray Place 2 sprays into the nose daily. Allergies 16 g 1  . HYDROcodone-acetaminophen (NORCO) 7.5-325 MG per tablet Take 1-2 tablets by mouth every 4 (four) hours as needed for moderate pain or severe pain.     .  isosorbide mononitrate (IMDUR) 30 MG 24 hr tablet Take 1 tablet (30 mg total) by mouth every evening. 30 tablet 11  . metoCLOPramide (REGLAN) 5 MG tablet Take 1 tablet (5 mg total) by mouth at bedtime. 30 tablet 2  . Niacin CR 1000 MG TBCR Take 1,000 mg by mouth at bedtime.     . nitroGLYCERIN (NITROSTAT) 0.4 MG SL tablet Place 0.4 mg under the tongue every 5 (five) minutes as needed. Chest pain     . Polyethyl Glycol-Propyl Glycol (SYSTANE) 0.4-0.3 % SOLN Apply 2 drops to eye at bedtime.    . pravastatin (PRAVACHOL) 20 MG tablet Take 60 mg by mouth at bedtime.     . ranitidine (ZANTAC) 150 MG tablet Take 1 tablet (150 mg total) by mouth 2 (two) times daily. 60 tablet 1  . senna (SENOKOT) 8.6 MG tablet Alternates with one tablet one day then two tablets the next day    . valsartan (DIOVAN) 160 MG tablet Take 1 tablet (160 mg total) by mouth 2 (two) times daily.     No current facility-administered medications for this visit.    Allergies:   Actos; Celebrex; Demerol; Morphine and related; Ciprofloxacin; Metformin; and Zocor   Social History:  The patient  reports that he quit smoking about 19 years ago. He has never used smokeless tobacco. He reports that he does not drink alcohol or use illicit drugs.   Family History:  The patient's    family history includes Cancer in his mother; Heart attack in his brother and other family members; Hypertension in his mother. There is no history of Colon cancer or Stroke.    ROS:  Please see the history of present illness and past medical history  Otherwise, review of systems is negative      PHYSICAL EXAM: VS:  BP 162/66 mmHg  Pulse 66  Ht 5\' 6"  (1.676 m)  Wt 216 lb 9.6 oz (98.249 kg)  BMI 34.98 kg/m2 , BMI Body mass index is 34.98 kg/(m^2). GEN: Well nourished, well developed, in no acute distress HEENT: normal Neck:  JVD flat , carotid bruits, or masses Cardiac: REGULAR RATE and RHYTHM ; 2/6 No murmurs, rubs, No S4  Back without kyphosis or  CVAT Respiratory:  clear to auscultation bilaterally, normal work of breathing GI: soft, nontender, nondistended, + BS MS: no deformity or atrophy Extremities no clubbing cyanosis  edema Skin: warm and dry,  device pocket is well healed without teathering Neuro:  Strength and  sensation are intact Psych: euthymic mood, full affect  EKG:  EKG is not ordered today. T  Device interrogation is reviewed today in detail.  See PaceArt for details.  Recent Labs: 02/23/2014: Pro B Natriuretic peptide (BNP) 35.0 04/11/2014: ALT 33; BUN 11; Creatinine 1.0; Hemoglobin 13.8; Platelets 176; Potassium 4.4; Sodium 135*; TSH 0.93    Lipid Panel     Component Value Date/Time   CHOL 89 04/11/2014   TRIG 93 04/11/2014   TRIG 70 01/06/2010   HDL 40 04/11/2014   CHOLHDL 4 05/30/2012 1141   VLDL 22.8 05/30/2012 1141   LDLCALC 30 04/11/2014     Wt Readings from Last 3 Encounters:  07/16/14 216 lb 9.6 oz (98.249 kg)  07/11/14 214 lb 6 oz (97.24 kg)  05/23/14 220 lb 4 oz (99.905 kg)      Other studies Reviewed: Additional studies/ records that were reviewed today include: none   Review of the above records today demonstrates: As above    ASSESSMENT AND PLAN: Sinus Node Dysfunction--Tachybrady  Pacemaker St Jude The patient's device was interrogated.  The information was reviewed. No changes were made in the programming.     CAD  Hypertension  Weigh loss  Dyslipidemia  CAD without symptoms of ischemia  HTN remains poorly controlled, will change valsartan to once daily and add chlorthalidone and see whether we can fo this with no more total pills   Will check BMET in 2 weeks  I am concerned about 20% weight loss which has been unintentional, esp given his hx of prostate CA; he has had his colon assessed recently.  I will be in contact with his PCP  Also based on the recent data, I will ask Dr [facility] to consider stopping his niacin    Current medicines are reviewed at length with the  patient today.   The patient does not have concerns regarding his medicines.  The following changes were made today:  As above   Labs/ tests ordered today include:    Orders Placed This Encounter  Procedures  . Implantable device check     Disposition:   FU with me   1 year(s)  Signed, [name], MD  07/16/2014 8:46 AM     [facility] [address] [place] [address] [phone] (office) [phone] (fax)

## 2014-07-16 NOTE — Telephone Encounter (Signed)
Pt aware of Dr.Smith's instructions to STOP Niacin. Pt verbalized understanding.

## 2014-07-16 NOTE — Patient Instructions (Addendum)
Your physician has recommended you make the following change in your medication:  1) DECREASE Valsartan to 160 mg daily 2) START Chlorthalidone 12.5 mg daily  Labs today: BMET  Your physician recommends that you return for lab work in: 2 weeks for BMET  Your physician wants you to follow-up in: 6 months with device clinic.  You will receive a reminder letter in the mail two months in advance. If you don't receive a letter, please call our office to schedule the follow-up appointment.  Your physician wants you to follow-up in: 1 year with Dr. Caryl Comes.  You will receive a reminder letter in the mail two months in advance. If you don't receive a letter, please call our office to schedule the follow-up appointment.

## 2014-07-18 ENCOUNTER — Encounter: Payer: Self-pay | Admitting: Internal Medicine

## 2014-07-18 ENCOUNTER — Telehealth: Payer: Self-pay | Admitting: Internal Medicine

## 2014-07-18 DIAGNOSIS — M5136 Other intervertebral disc degeneration, lumbar region: Secondary | ICD-10-CM | POA: Diagnosis not present

## 2014-07-18 DIAGNOSIS — M545 Low back pain: Secondary | ICD-10-CM | POA: Diagnosis not present

## 2014-07-18 DIAGNOSIS — M4690 Unspecified inflammatory spondylopathy, site unspecified: Secondary | ICD-10-CM | POA: Diagnosis not present

## 2014-07-18 NOTE — Telephone Encounter (Signed)
Patient tells me that he is spitting up brownish liquid, it then turns to bright red.  This is occurring only in the morning.  No other symptoms. Patient advised to holdPlavix & ASA, call PCP first thing in the morning to be seen.  BP and coughing up blood needs to be addressed stat.   Informed patient that I will call him Friday to see what PCP says and determine next step in plan of care and/or restarting medications. The patient indicates understanding of these issues and agrees with the plan.

## 2014-07-18 NOTE — Telephone Encounter (Signed)
New problem   Pt told Case Manger  his BP has been 153-174 on systotic reading and pt has took his BP medication and it has been ordered. Pt has been coughing up blood from nose and mouth for last 2wks.  Santiago Glad stated the pt wanted her to call the office to let us know. Please call patient back per case manager.

## 2014-07-19 ENCOUNTER — Encounter: Payer: Self-pay | Admitting: Internal Medicine

## 2014-07-19 ENCOUNTER — Ambulatory Visit (INDEPENDENT_AMBULATORY_CARE_PROVIDER_SITE_OTHER): Payer: Medicare Other | Admitting: Internal Medicine

## 2014-07-19 VITALS — BP 148/72 | HR 81 | Temp 98.4°F | Resp 18 | Ht 66.0 in | Wt 211.0 lb

## 2014-07-19 DIAGNOSIS — I10 Essential (primary) hypertension: Secondary | ICD-10-CM

## 2014-07-19 DIAGNOSIS — I2510 Atherosclerotic heart disease of native coronary artery without angina pectoris: Secondary | ICD-10-CM

## 2014-07-19 DIAGNOSIS — R634 Abnormal weight loss: Secondary | ICD-10-CM | POA: Diagnosis not present

## 2014-07-19 NOTE — Patient Instructions (Addendum)
We will have you increase your chlorthalidone to 1 pill a day. Continue taking it in the evening. If you are having problems with dizziness or lightheadedness go back to taking 1/2 pill a day.   We will see you back in about 2-3 weeks to check on the blood pressure.   If you are still having the blood in the sputum at that visit I would recommend we investigate further into the lungs.

## 2014-07-19 NOTE — Progress Notes (Signed)
Pre visit review using our clinic review tool, if applicable. No additional management support is needed unless otherwise documented below in the visit note. 

## 2014-07-20 DIAGNOSIS — H02052 Trichiasis without entropian right lower eyelid: Secondary | ICD-10-CM | POA: Diagnosis not present

## 2014-07-20 NOTE — Assessment & Plan Note (Signed)
Now taking valsartan daily, imdur. Will increase his chlorthalidone to 1 pill daily (hard for him to break in half due to size and BP slightly elevated). Will see him back in 2-3 weeks. If any dizziness or lightheadedness he will call the office and reduce to 1/2 pill chlorthalidone daily.

## 2014-07-20 NOTE — Progress Notes (Signed)
   Subjective:    Patient ID: Eric [name], male    DOB: March 02, 1927, 81 y.o.   MRN: [id number]  HPI The patient is an 81 YO man who is coming in for high blood pressure. He has been having it the last 4-6 weeks. He has told his cardiologist that started him on 1/2 of a blood pressure pill. He is tired of seeing different people and hearing different things. He has been having off and on headaches over this time period but they never last and he does not take anything for them. Denies one today. He denies chest pains or tightness. He denies nausea or vomiting or confusion. He denies any change to his diet and tries to eat healthy (low salt). He exercises the same as before. He denies any new personal stressors in his life. He is also losing weight and having blood in his sputum. He was stopped on plavix to see if this would help with the sputum. He has had abdominal CT recently without acute findings as well as x-ray (reviewed at this visit). He just stopped the plavix 2 days ago.   Review of Systems  Constitutional: Positive for unexpected weight change. Negative for fever, chills, activity change, appetite change and fatigue.  HENT: Negative.   Respiratory: Positive for cough. Negative for chest tightness, shortness of breath and wheezing.        Blood in sputum  Cardiovascular: Negative for chest pain, palpitations and leg swelling.  Gastrointestinal: Negative for nausea, abdominal pain, diarrhea, constipation, blood in stool and abdominal distention.  Skin: Negative.   Neurological: Negative.   Psychiatric/Behavioral: Negative.       Objective:   Physical Exam  Constitutional: He is oriented to person, place, and time. He appears well-developed and well-nourished.  HENT:  Head: Normocephalic and atraumatic.  Eyes: EOM are normal.  Neck: Normal range of motion.  Cardiovascular: Normal rate and regular rhythm.   Pulmonary/Chest: Effort normal and breath sounds normal. No respiratory  distress. He has no wheezes. He has no rales.  Abdominal: Soft. Bowel sounds are normal. He exhibits no distension. There is no tenderness. There is no rebound.  Musculoskeletal: He exhibits no edema.  Neurological: He is alert and oriented to person, place, and time. Coordination normal.  Skin: Skin is warm and dry.  Psychiatric: He has a normal mood and affect. His behavior is normal.   Filed Vitals:   07/19/14 1118 07/19/14 1147  BP: 170/82 148/72  Pulse: 81   Temp: 98.4 F (36.9 C)   TempSrc: Oral   Resp: 18   Height: 5\' 6"  (1.676 m)   Weight: 211 lb (95.709 kg)   SpO2: 91%       Assessment & Plan:

## 2014-07-20 NOTE — Assessment & Plan Note (Signed)
Given his history abdominal CT checked and negative for changes as well as CXR negative for changes. Given the blood in sputum and still losing weight would consider CT chest if no improvement in the blood in sputum at follow up (2-3 weeks) after stopping plavix. Will forward to PCP so they are aware.

## 2014-07-23 DIAGNOSIS — H02055 Trichiasis without entropian left lower eyelid: Secondary | ICD-10-CM | POA: Diagnosis not present

## 2014-07-23 DIAGNOSIS — H02052 Trichiasis without entropian right lower eyelid: Secondary | ICD-10-CM | POA: Diagnosis not present

## 2014-07-23 DIAGNOSIS — H02054 Trichiasis without entropian left upper eyelid: Secondary | ICD-10-CM | POA: Diagnosis not present

## 2014-07-23 DIAGNOSIS — H02051 Trichiasis without entropian right upper eyelid: Secondary | ICD-10-CM | POA: Diagnosis not present

## 2014-07-24 NOTE — Telephone Encounter (Signed)
F/U        Pt states he is returning a call and he's not sure what it is about.  Pt would like call back.

## 2014-07-25 ENCOUNTER — Ambulatory Visit: Payer: Medicare Other | Admitting: Internal Medicine

## 2014-07-25 NOTE — Telephone Encounter (Signed)
I called to check up on patient last week.  He tells me that he is doing much better since stopping Plavix/Aspirin. No more bleeding.  He just complains of extremely thick sputum in which he has followed PCP and pulmonologist tomorrow. Patient understands that Dr. Caryl Comes will review this to determine re-starting medication/s or not.  He knows I will call him once reviewed by Dr. Caryl Comes and is agreeable to this plan.

## 2014-07-26 ENCOUNTER — Encounter: Payer: Self-pay | Admitting: Internal Medicine

## 2014-07-26 ENCOUNTER — Ambulatory Visit (INDEPENDENT_AMBULATORY_CARE_PROVIDER_SITE_OTHER): Payer: Medicare Other | Admitting: Internal Medicine

## 2014-07-26 ENCOUNTER — Other Ambulatory Visit: Payer: Medicare Other

## 2014-07-26 ENCOUNTER — Ambulatory Visit (INDEPENDENT_AMBULATORY_CARE_PROVIDER_SITE_OTHER)
Admission: RE | Admit: 2014-07-26 | Discharge: 2014-07-26 | Disposition: A | Discharge status: Home / Self Care | Payer: Medicare Other | Source: Ambulatory Visit | Attending: Internal Medicine | Admitting: Internal Medicine

## 2014-07-26 VITALS — BP 124/66 | HR 72 | Ht 66.0 in | Wt 209.2 lb

## 2014-07-26 DIAGNOSIS — J449 Chronic obstructive pulmonary disease, unspecified: Secondary | ICD-10-CM | POA: Diagnosis not present

## 2014-07-26 DIAGNOSIS — I2510 Atherosclerotic heart disease of native coronary artery without angina pectoris: Secondary | ICD-10-CM

## 2014-07-26 DIAGNOSIS — J441 Chronic obstructive pulmonary disease with (acute) exacerbation: Secondary | ICD-10-CM

## 2014-07-26 MED ORDER — CLARITHROMYCIN 500 MG PO TABS: ORAL_TABLET | ORAL | Status: DC

## 2014-07-26 NOTE — Progress Notes (Signed)
04/10/11- 81 year old male former smoker, Veteran, who had been a patient of mine in the old [facility]. He is coming to reestablish after moving back from [place] to [place]. History of asthma, COPD, obstructive sleep apnea complicated by DM, obesity, CAD/ischemic CM/sick sinus/bradycardia tachycardia with pacemaker, history of prostate cancer, anemia. He reports feeling stable today. New carpet smell in his apartment is irritating to his breathing but he denies routine cough or recent bronchitis. He has not felt the need to use his bronchodilators in 4 months. Walking distances limited more by degenerative joint hip pain. He can walk a maximum of 10 minutes on level ground and very little on hills or stairs. He denies blood, adenopathy, discolored sputum or swelling. He usually has trace ankle edema with no history of venous thromboembolic disease. CAD treated with CABG, stent and pacemaker but he has not had infarction. Did have pneumonia at age 70. He declines flu vaccine. Has had pneumonia vaccine. Long-term diagnosis of sleep apnea. NPSG 01/27/06-AHI 59.9 per hour. CPAP 12 plus oxygen 2 L for sleep, with good compliance and control. He will want to change to a local provider.  07/14/11- 81 year old male former smoker, Veteran,  History of asthma, COPD, obstructive sleep apnea complicated by DM, obesity, CAD/ischemic CM/sick sinus/bradycardia tachycardia with pacemaker, history of prostate cancer, anemia He had his first flu vaccine in 10 years this winter and avoided any significant respiratory infection. He no longer thinks he needs daytime oxygen. He is wearing it only at night, with his CPAP. Scheduled for right hip replacement in March. Chest x-ray 04/15/2011-no acute process, pacemaker PFT 04/23/2011-mild COPD  08/14/11-  81 year old male former smoker, Veteran,  History of asthma, COPD, obstructive sleep apnea complicated by DM, obesity, CAD/ischemic CM/sick sinus/bradycardia tachycardia/ pacemaker,  history of prostate cancer, anemia He is pending hip replacement by Dr. Tonita Cong on March 15. Asks prior authorization. I explained his increased risk of cardiopulmonary complications from any surgery and recognition that he is probably stable and near his best function now for necessary surgery. He is trying to move from his current apartment to a smoke free facility. A neighbor smokes heavily and the tobacco odor comes into the air conditioning ducts. He continues good compliance and symptom control using CPAP 12 with O2 2 L every night. He will use those settings while he is at the hospital for sleep and in recovery after extubation.  10/26/11- 81 year old male former smoker, Veteran,  History of asthma, COPD, obstructive sleep apnea complicated by DM, obesity, CAD/ischemic CM/sick sinus/bradycardia tachycardia/ pacemaker, history of prostate cancer, anemia Pt states having a productive cough  yellowish brown ,,increase sob wheezing ,chest congestion, cold chills .  Had right total hip replacement and finished physical therapy. Pain still limits walking. No respiratory complications. Now for 3 days had increased cough, yellow sputum, shortness of breath, wheeze, cold chills. Continued reflux symptoms on Nexium once daily. Discussion of steroid therapy and side effects.  11/05/11- 81 year old male former smoker, Veteran,  History of asthma, COPD, obstructive sleep apnea complicated by DM, obesity, CAD/ischemic CM/sick sinus/bradycardia tachycardia/ pacemaker, history of prostate cancer, anemia ACUTE VISIT: SOB and cough-productive yellow in color.Would like Zpak. Seen 10 days ago and Depo-Medrol and Z-Pak did help at that visit. Comfortable with CPAP. He takes it off to cough.  01/04/12- 81 year old male former smoker, Veteran,  History of asthma, COPD, obstructive sleep apnea complicated by DM, obesity, CAD/ischemic CM/sick sinus/bradycardia tachycardia/ pacemaker, history of prostate cancer,  anemia. Needs recert for O2 at night per  [facility]; pt states he feels like he needs O2 during the day as well. He says he sleeps and feels better when  using oxygen. Continue CPAP 12/Advanced, with oxygen at 2 L currently. COPD assessment test (CAT) 31/40.  03/07/12- 81 year old male former smoker, Veteran,  History of asthma, COPD, obstructive sleep apnea complicated by DM, obesity, CAD/ischemic CM/sick sinus/bradycardia tachycardia/ pacemaker, history of prostate cancer, anemia. Has good and bad days; able to move through the home with O2 (3L) on. Has had flu vaccine He qualified for home oxygen and uses it 90% of the time. Needs to wear it to clean his apartment. CPAP 12 "can't sleep without it". Walks laps in parking lot for exercise.  09/13/12- 89-year-old male former smoker, Veteran,  History of asthma, COPD, obstructive sleep apnea complicated by DM, obesity, CAD/ischemic CM/sick sinus/bradycardia tachycardia/ pacemaker, history of prostate cancer, anemia. Follows For:  Requalify for O2 for [facility] -  Pt doesnt feel like he needs O2 - SOB with increased housework activity - PT had fall 2 weeks ago and hurt back - Scheduled for CT this week Isn't sure he needs portable oxygen but does have concentrator in his house for sleep and as needed. He can't sleep without CPAP 12/ APS all night, every night, O2 2L/ Advanced. CXR 11/05/11 IMPRESSION:  Bibasilar bronchiectasis with new associated nodular air space  disease, indicative of an infectious bronchiolitis or  bronchopneumonia.  Original Report Authenticated By: Luretha Rued, M.D.  03/16/13-  81 year old male former smoker, Veteran,  History of asthma, COPD, obstructive sleep apnea complicated by DM, obesity, CAD/ischemic CM/sick sinus/bradycardia tachycardia/ pacemaker, history of prostate cancer, anemia. CPAP 12 / O2 2L/ APS sleep. Would like not to need O2 for exertion.  needs o2 recertified.  Breathing is unchanged.  Wearing cpap every night.  No  problems with mask or pressure.   Dizzy if he stands quickly and questions blood pressure medicines. Sees Dr Asa Lente tomorrow. CXR 02/02/13 IMPRESSION:  No acute abnormalities.  Original Report Authenticated By: Lorriane Shire, M.D. Exercise oximetry 03/16/13- rest RA 93%, walking RA 87%, Walking 2L O2 98%.   09/14/13-  81 year old male former smoker, Veteran,  History of asthma, COPD, obstructive sleep apnea complicated by DM, obesity, CAD/ischemic CM/sick sinus/bradycardia tachycardia/ pacemaker, history of prostate cancer, anemia. CPAP 12 / O2 2L/ Advanced sleep. FOLLOWS FOR: back pain continues to make it hard to breath. Otherwise continues to wear O2 during day on pulsing and O2 concentrator at night. Desat on room air at rest today to 88%. CXR 07/17/13 IMPRESSION:  Sequential pacemaker in place. Post CABG. Cardiomegaly.  Calcified tortuous aorta.  Mild central pulmonary vascular prominence stable.  Basilar scarring/subsegmental atelectasis without segmental  consolidation.  Electronically Signed  By: Chauncey Cruel M.D.  On: 07/17/2013 16:32  11/07/13- 81 year old male former smoker, Veteran,  History of asthma, COPD, obstructive sleep apnea complicated by DM, obesity, CAD/ischemic CM/sick sinus/bradycardia tachycardia/ pacemaker, history of prostate cancer, anemia. CPAP 12 / O2 2L/ Advanced sleep. Dyspnea on exertion with pulse oxygen, okay at rest. He worries about being allergic to bushes around his home because he has a "bitter taste" in his mouth. Neighbor upstairs smokes and the odor bothers patient. Continues CPAP all night every night and believes it helps him Walk test on oxygen- 11/07/13- 3 laps x 185 feet, lowest sat 91 % on 2L pulsed O2  01/18/14- 81 year old male former smoker, Veteran,  History of asthma, COPD, obstructive sleep apnea complicated by DM, obesity, CAD/ischemic CM/sick sinus/bradycardia tachycardia/ pacemaker,  history of prostate cancer, anemia. CPAP 12 / O2 2L/  Advanced sleep. FOLLOWS FOR:  acute visit today.  sore throat x 2-3 weeks.  pt stated that when he uses his inhalers he is having burning in his chest and wheezing at times with the inhaler use.   05/09/14-81 year old male former smoker, Veteran,  History of asthma, COPD, obstructive sleep apnea complicated by DM, obesity, CAD/ischemic CM/sick sinus/bradycardia tachycardia/ pacemaker, history of prostate cancer, anemia.  CPAP 12 / O2 2L/ Advanced sleep and exertion FOLLOWS FOR: been to UC and given Doxycycline-completed and no better. Continues to have cough-yellow in color with chest congestion and wheezing. He liked [employer] but questions insurance coverage. Has had worse bronchitis for 3 months-cough with yellow mucus, wheeze, chest congestion. Comes and goes. No better after doxycycline. Also asks small portable oxygen concentrator instead of the portable oxygen system he is now using. CXR 02/09/14 IMPRESSION: Stable chronic findings with no change from 07/17/2013. Electronically Signed  By: Skipper Cliche M.D.  On: 02/09/2014 10:01  07/26/14- 81 year old male former smoker, Veteran,  History of asthma, COPD, obstructive sleep apnea complicated by DM, obesity, CAD/ischemic CM/sick sinus/bradycardia tachycardia/ pacemaker, history of prostate cancer, anemia. CPAP 12 / O2 2L/ Advanced sleep and exertion. FOLLOWS FOR: Continues O2; Pt states he is coughing up yellow and thick phelgm since last summer.  Has been using Robtussion DM and Mucinex; has hard time taling due to congestion. He was treated with Augmentin December 12 and doxycycline January 21. History of rash with Cipro. He does not recognize reflux or choking on food.  ROS-see HPI Constitutional:   No-   weight loss, night sweats, fevers, chills, fatigue, lassitude. HEENT:   No-  headaches, difficulty swallowing, tooth/dental problems, sore throat,       No-  sneezing, itching, ear ache, nasal congestion, post nasal drip,  CV:  No-  recent  chest pain, orthopnea, PND. +Mild chronic swelling in lower extremities,                            No- anasarca, dizziness, palpitations Resp: +  shortness of breath with exertion or at rest.              + productive cough,  + non-productive cough,  No- coughing up of blood.              + change in color of mucus.  No- wheezing.   Skin: No-   rash or lesions. GI:  No-   heartburn, indigestion, abdominal pain, nausea, vomiting,  GU:  MS: . Limiting hip pain Neuro-     nothing unusual Psych:  No- change in mood or affect. No depression or anxiety.  No memory loss.  OBJ General- Alert, Oriented, Affect-appropriate, Distress- none acute, overweight, O2 2 L Skin- rash-none, lesions- none, excoriation- none Lymphadenopathy- none Head- atraumatic            Eyes- Gross vision intact, PERRLA, conjunctivae clear secretions            Ears- Hearing aids, HOH            Nose- Clear, no-Septal dev, mucus, polyps, erosion, perforation             Throat- Mallampati II , mucosa clear , drainage- none, tonsils- atrophic, +  dentures                     Neck- flexible , trachea midline, no stridor , thyroid nl, carotid no bruit Chest - symmetrical excursion , unlabored           Heart/CV- RRR +bigeminal pulse , no murmur , no gallop  , no rub, nl s1 s2                           - JVD- none , edema- none, stasis changes- none, varices- none           Lung- +few rhonchi, unlabored, wheeze- none, cough + harsh/ deep,   dullness-none, rub-  none. Raspy .           Chest wall- pacemaker left chest Abd- Br/ Gen/ Rectal- Not done, not indicated Extrem- cyanosis- none, clubbing, none, atrophy- none, strength- deconditioned, +cane Neuro- grossly intact to observation

## 2014-07-26 NOTE — Patient Instructions (Addendum)
Order- Lab- Sputum C&S   Routine, fungal and AFB     Dx chronic bronchitis exacerbation  Order- CXR  Script sent for Biaxin/ clarithromycin antibiotic to take twice daily after meals      Don't take your statin drug for cholesterol- Pravastatin/ Pravachol- while you are on this antibiotic

## 2014-07-26 NOTE — Assessment & Plan Note (Addendum)
Exacerbation with bronchitis Harsh, bronchitic sounding cough. Either we are not finding the right antibiotic or use repeatedly injuring his airways. Pulmonary congestion from heart disease might give a similar cough but I would not expect discolored sputum. Plan-sputum cultures, CXR, Biaxin

## 2014-07-27 DIAGNOSIS — Z951 Presence of aortocoronary bypass graft: Secondary | ICD-10-CM | POA: Diagnosis not present

## 2014-07-27 DIAGNOSIS — J984 Other disorders of lung: Secondary | ICD-10-CM | POA: Diagnosis not present

## 2014-07-27 DIAGNOSIS — Z950 Presence of cardiac pacemaker: Secondary | ICD-10-CM | POA: Diagnosis not present

## 2014-07-27 DIAGNOSIS — R0989 Other specified symptoms and signs involving the circulatory and respiratory systems: Secondary | ICD-10-CM | POA: Diagnosis not present

## 2014-07-29 LAB — RESPIRATORY CULTURE OR RESPIRATORY AND SPUTUM CULTURE

## 2014-07-30 ENCOUNTER — Other Ambulatory Visit (INDEPENDENT_AMBULATORY_CARE_PROVIDER_SITE_OTHER): Payer: Medicare Other | Admitting: *Deleted

## 2014-07-30 DIAGNOSIS — Z79899 Other long term (current) drug therapy: Secondary | ICD-10-CM | POA: Diagnosis not present

## 2014-07-30 LAB — BASIC METABOLIC PANEL
BUN: 20 mg/dL (ref 6–23)
CALCIUM: 11.3 mg/dL — AB (ref 8.4–10.5)
CO2: 26 meq/L (ref 19–32)
CREATININE: 0.88 mg/dL (ref 0.40–1.50)
Chloride: 101 mEq/L (ref 96–112)
GFR: 87.41 mL/min (ref >60.00)
Glucose, Bld: 112 mg/dL — ABNORMAL HIGH (ref 70–99)
Potassium: 4 mEq/L (ref 3.5–5.1)
Sodium: 134 mEq/L — ABNORMAL LOW (ref 135–145)

## 2014-07-31 ENCOUNTER — Telehealth: Payer: Self-pay | Admitting: Internal Medicine

## 2014-08-01 ENCOUNTER — Ambulatory Visit (INDEPENDENT_AMBULATORY_CARE_PROVIDER_SITE_OTHER): Payer: Medicare Other | Admitting: Internal Medicine

## 2014-08-01 ENCOUNTER — Encounter: Payer: Self-pay | Admitting: Internal Medicine

## 2014-08-01 VITALS — BP 134/56 | HR 84 | Temp 98.4°F | Resp 18 | Wt 208.8 lb

## 2014-08-01 DIAGNOSIS — I2510 Atherosclerotic heart disease of native coronary artery without angina pectoris: Secondary | ICD-10-CM | POA: Diagnosis not present

## 2014-08-01 DIAGNOSIS — Z950 Presence of cardiac pacemaker: Secondary | ICD-10-CM | POA: Diagnosis not present

## 2014-08-01 DIAGNOSIS — I10 Essential (primary) hypertension: Secondary | ICD-10-CM

## 2014-08-01 DIAGNOSIS — I495 Sick sinus syndrome: Secondary | ICD-10-CM | POA: Diagnosis not present

## 2014-08-01 MED ORDER — SENNOSIDES 8.6 MG PO TABS: 1.0000 | ORAL_TABLET | Freq: Every day | ORAL | Status: DC

## 2014-08-01 MED ORDER — VALSARTAN 160 MG PO TABS: 160.0000 mg | ORAL_TABLET | Freq: Every day | ORAL | Status: DC

## 2014-08-01 MED ORDER — CHLORTHALIDONE 25 MG PO TABS: 25.0000 mg | ORAL_TABLET | Freq: Every day | ORAL | Status: DC

## 2014-08-01 MED ORDER — FLUTICASONE PROPIONATE 50 MCG/ACT NA SUSP: 2.0000 | Freq: Every day | NASAL | Status: AC

## 2014-08-01 MED ORDER — ISOSORBIDE MONONITRATE ER 30 MG PO TB24: 30.0000 mg | ORAL_TABLET | Freq: Every evening | ORAL | Status: DC

## 2014-08-01 MED ORDER — PRAVASTATIN SODIUM 20 MG PO TABS: 60.0000 mg | ORAL_TABLET | Freq: Every day | ORAL | Status: DC

## 2014-08-01 MED ORDER — RANITIDINE HCL 150 MG PO TABS: 150.0000 mg | ORAL_TABLET | Freq: Two times a day (BID) | ORAL | Status: DC

## 2014-08-01 NOTE — Telephone Encounter (Signed)
Okay to stop Plavix but needs to stay on low dose aspirin.

## 2014-08-01 NOTE — Progress Notes (Signed)
Pre visit review using our clinic review tool, if applicable. No additional management support is needed unless otherwise documented below in the visit note. 

## 2014-08-01 NOTE — Patient Instructions (Signed)
We will fax in your prescriptions to the [place] so that they know what you are taking and how often.   Keep taking the whole pill of that blood pressure medicine the chlorthalidone.

## 2014-08-01 NOTE — Telephone Encounter (Signed)
Dr. Caryl Comes asking Dr. Tamala Julian to advise on restarting/stopping Plavix/Aspirin. Will route to Dr. Tamala Julian.

## 2014-08-01 NOTE — Assessment & Plan Note (Signed)
Continue chlorthalidone 25 mg daily with his other medications. Will print and fax his medications to the [place] per his instructions to update their list and hopefully they will not try to give him medications he does not need.

## 2014-08-01 NOTE — Progress Notes (Signed)
   Subjective:    Patient ID: Eric [name], male    DOB: [birth year], 81 y.o.   MRN: [id number]  HPI The patient is an 81 YO man who is coming in for medication concerns regarding his hypertension. He has been taking the chlorthalidone as per my instructions but his cardiologist sent in a prescription with different instructions. He has not had any problems with the medicine. Denies lightheadedness, falls, syncope. His blood pressure has been under better control since taking the whole pill daily. [facility] is no longer taking and [facility] him for it.   Review of Systems  Constitutional: Positive for unexpected weight change. Negative for fever, chills, activity change, appetite change and fatigue.  HENT: Negative.   Respiratory: Positive for cough. Negative for chest tightness, shortness of breath and wheezing.        Cough improving  Cardiovascular: Negative for chest pain, palpitations and leg swelling.  Gastrointestinal: Negative for nausea, abdominal pain, diarrhea, constipation, blood in stool and abdominal distention.  Skin: Negative.   Neurological: Negative.   Psychiatric/Behavioral: Negative.       Objective:   Physical Exam  Constitutional: He is oriented to person, place, and time. He appears well-developed and well-nourished.  HENT:  Head: Normocephalic and atraumatic.  Eyes: EOM are normal.  Neck: Normal range of motion.  Cardiovascular: Normal rate and regular rhythm.   Pulmonary/Chest: Effort normal and breath sounds normal. No respiratory distress. He has no wheezes. He has no rales.  Some bronchitic sounds  Abdominal: Soft. Bowel sounds are normal. He exhibits no distension. There is no tenderness. There is no rebound.  Musculoskeletal: He exhibits no edema.  Neurological: He is alert and oriented to person, place, and time. Coordination normal.  Skin: Skin is warm and dry.  Psychiatric: He has a normal mood and  affect. His behavior is normal.   Filed Vitals:   08/01/14 1030  BP: 134/56  Pulse: 84  Temp: 98.4 F (36.9 C)  TempSrc: Oral  Resp: 18  Weight: 208 lb 12.8 oz (94.711 kg)  SpO2: 97%      Assessment & Plan:

## 2014-08-02 NOTE — Telephone Encounter (Signed)
Patient informed to stop Plavix, continue low dose ASA. Patient verbalized understanding and agreeable to plan.

## 2014-08-03 DIAGNOSIS — H02055 Trichiasis without entropian left lower eyelid: Secondary | ICD-10-CM | POA: Diagnosis not present

## 2014-08-03 DIAGNOSIS — H02054 Trichiasis without entropian left upper eyelid: Secondary | ICD-10-CM | POA: Diagnosis not present

## 2014-08-03 NOTE — Telephone Encounter (Signed)
Faxed office visit notes per [facility] request so that pt can get rx assistance.

## 2014-08-04 ENCOUNTER — Other Ambulatory Visit: Payer: Self-pay | Admitting: Internal Medicine

## 2014-08-06 ENCOUNTER — Ambulatory Visit: Payer: Medicare Other | Admitting: Internal Medicine

## 2014-08-20 DIAGNOSIS — H02052 Trichiasis without entropian right lower eyelid: Secondary | ICD-10-CM | POA: Diagnosis not present

## 2014-08-20 DIAGNOSIS — H02051 Trichiasis without entropian right upper eyelid: Secondary | ICD-10-CM | POA: Diagnosis not present

## 2014-08-20 DIAGNOSIS — H16103 Unspecified superficial keratitis, bilateral: Secondary | ICD-10-CM | POA: Diagnosis not present

## 2014-08-20 DIAGNOSIS — H02054 Trichiasis without entropian left upper eyelid: Secondary | ICD-10-CM | POA: Diagnosis not present

## 2014-08-21 LAB — FUNGUS CULTURE W SMEAR: Smear Result: NONE SEEN

## 2014-08-27 DIAGNOSIS — H01001 Unspecified blepharitis right upper eyelid: Secondary | ICD-10-CM | POA: Diagnosis not present

## 2014-08-27 DIAGNOSIS — H01004 Unspecified blepharitis left upper eyelid: Secondary | ICD-10-CM | POA: Diagnosis not present

## 2014-08-27 DIAGNOSIS — H02051 Trichiasis without entropian right upper eyelid: Secondary | ICD-10-CM | POA: Diagnosis not present

## 2014-09-08 LAB — AFB CULTURE WITH SMEAR (NOT AT ARMC): ACID FAST SMEAR: NONE SEEN

## 2014-09-10 ENCOUNTER — Ambulatory Visit (INDEPENDENT_AMBULATORY_CARE_PROVIDER_SITE_OTHER): Payer: Medicare Other | Admitting: Internal Medicine

## 2014-09-10 ENCOUNTER — Encounter: Payer: Self-pay | Admitting: Internal Medicine

## 2014-09-10 VITALS — BP 108/82 | HR 72 | Ht 66.0 in | Wt 215.0 lb

## 2014-09-10 DIAGNOSIS — G4733 Obstructive sleep apnea (adult) (pediatric): Secondary | ICD-10-CM | POA: Diagnosis not present

## 2014-09-10 DIAGNOSIS — I2510 Atherosclerotic heart disease of native coronary artery without angina pectoris: Secondary | ICD-10-CM

## 2014-09-10 DIAGNOSIS — J449 Chronic obstructive pulmonary disease, unspecified: Secondary | ICD-10-CM

## 2014-09-10 NOTE — Patient Instructions (Signed)
We can continue present meds, as well as CPAP 12 and O2 2-3L through [place]  Please call if we can be of any help

## 2014-09-10 NOTE — Progress Notes (Signed)
04/10/11- 81 year old male former smoker, Veteran, who had been a patient of mine in the old [employer]. He is coming to reestablish after moving back from [place] to [place]. History of asthma, COPD, obstructive sleep apnea complicated by DM, obesity, CAD/ischemic CM/sick sinus/bradycardia tachycardia with pacemaker, history of prostate cancer, anemia. He reports feeling stable today. New carpet smell in his apartment is irritating to his breathing but he denies routine cough or recent bronchitis. He has not felt the need to use his bronchodilators in 4 months. Walking distances limited more by degenerative joint hip pain. He can walk a maximum of 10 minutes on level ground and very little on hills or stairs. He denies blood, adenopathy, discolored sputum or swelling. He usually has trace ankle edema with no history of venous thromboembolic disease. CAD treated with CABG, stent and pacemaker but he has not had infarction. Did have pneumonia at age 62. He declines flu vaccine. Has had pneumonia vaccine. Long-term diagnosis of sleep apnea. NPSG 01/27/06-AHI 59.9 per hour. CPAP 12 plus oxygen 2 L for sleep, with good compliance and control. He will want to change to a local provider.  07/14/11- 81 year old male former smoker, Veteran,  History of asthma, COPD, obstructive sleep apnea complicated by DM, obesity, CAD/ischemic CM/sick sinus/bradycardia tachycardia with pacemaker, history of prostate cancer, anemia He had his first flu vaccine in 10 years this winter and avoided any significant respiratory infection. He no longer thinks he needs daytime oxygen. He is wearing it only at night, with his CPAP. Scheduled for right hip replacement in March. Chest x-ray 04/15/2011-no acute process, pacemaker PFT 04/23/2011-mild COPD  08/14/11-  81 year old male former smoker, Veteran,  History of asthma, COPD, obstructive sleep apnea complicated by DM, obesity, CAD/ischemic CM/sick sinus/bradycardia tachycardia/ pacemaker,  history of prostate cancer, anemia He is pending hip replacement by Dr. Tonita Cong on March 15. Asks prior authorization. I explained his increased risk of cardiopulmonary complications from any surgery and recognition that he is probably stable and near his best function now for necessary surgery. He is trying to move from his current apartment to a smoke free facility. A neighbor smokes heavily and the tobacco odor comes into the air conditioning ducts. He continues good compliance and symptom control using CPAP 12 with O2 2 L every night. He will use those settings while he is at the hospital for sleep and in recovery after extubation.  10/26/11- 81 year old male former smoker, Veteran,  History of asthma, COPD, obstructive sleep apnea complicated by DM, obesity, CAD/ischemic CM/sick sinus/bradycardia tachycardia/ pacemaker, history of prostate cancer, anemia Pt states having a productive cough  yellowish brown ,,increase sob wheezing ,chest congestion, cold chills .  Had right total hip replacement and finished physical therapy. Pain still limits walking. No respiratory complications. Now for 3 days had increased cough, yellow sputum, shortness of breath, wheeze, cold chills. Continued reflux symptoms on Nexium once daily. Discussion of steroid therapy and side effects.  11/05/11- 81 year old male former smoker, Veteran,  History of asthma, COPD, obstructive sleep apnea complicated by DM, obesity, CAD/ischemic CM/sick sinus/bradycardia tachycardia/ pacemaker, history of prostate cancer, anemia ACUTE VISIT: SOB and cough-productive yellow in color.Would like Zpak. Seen 10 days ago and Depo-Medrol and Z-Pak did help at that visit. Comfortable with CPAP. He takes it off to cough.  01/04/12- 81 year old male former smoker, Veteran,  History of asthma, COPD, obstructive sleep apnea complicated by DM, obesity, CAD/ischemic CM/sick sinus/bradycardia tachycardia/ pacemaker, history of prostate cancer,  anemia. Needs recert for O2 at night per  [facility]; pt states he feels like he needs O2 during the day as well. He says he sleeps and feels better when  using oxygen. Continue CPAP 12/Advanced, with oxygen at 2 L currently. COPD assessment test (CAT) 31/40.  03/07/12- 81 year old male former smoker, Veteran,  History of asthma, COPD, obstructive sleep apnea complicated by DM, obesity, CAD/ischemic CM/sick sinus/bradycardia tachycardia/ pacemaker, history of prostate cancer, anemia. Has good and bad days; able to move through the home with O2 (3L) on. Has had flu vaccine He qualified for home oxygen and uses it 90% of the time. Needs to wear it to clean his apartment. CPAP 12 "can't sleep without it". Walks laps in parking lot for exercise.  09/13/12- 10-year-old male former smoker, Veteran,  History of asthma, COPD, obstructive sleep apnea complicated by DM, obesity, CAD/ischemic CM/sick sinus/bradycardia tachycardia/ pacemaker, history of prostate cancer, anemia. Follows For:  Requalify for O2 for [facility] -  Pt doesnt feel like he needs O2 - SOB with increased housework activity - PT had fall 2 weeks ago and hurt back - Scheduled for CT this week Isn't sure he needs portable oxygen but does have concentrator in his house for sleep and as needed. He can't sleep without CPAP 12/ APS all night, every night, O2 2L/ Advanced. CXR 11/05/11 IMPRESSION:  Bibasilar bronchiectasis with new associated nodular air space  disease, indicative of an infectious bronchiolitis or  bronchopneumonia.  Original Report Authenticated By: Luretha Rued, M.D.  03/16/13-  81 year old male former smoker, Veteran,  History of asthma, COPD, obstructive sleep apnea complicated by DM, obesity, CAD/ischemic CM/sick sinus/bradycardia tachycardia/ pacemaker, history of prostate cancer, anemia. CPAP 12 / O2 2L/ APS sleep. Would like not to need O2 for exertion.  needs o2 recertified.  Breathing is unchanged.  Wearing cpap every night.  No  problems with mask or pressure.   Dizzy if he stands quickly and questions blood pressure medicines. Sees Dr Asa Lente tomorrow. CXR 02/02/13 IMPRESSION:  No acute abnormalities.  Original Report Authenticated By: Lorriane Shire, M.D. Exercise oximetry 03/16/13- rest RA 93%, walking RA 87%, Walking 2L O2 98%.   09/14/13-  81 year old male former smoker, Veteran,  History of asthma, COPD, obstructive sleep apnea complicated by DM, obesity, CAD/ischemic CM/sick sinus/bradycardia tachycardia/ pacemaker, history of prostate cancer, anemia. CPAP 12 / O2 2L/ Advanced sleep. FOLLOWS FOR: back pain continues to make it hard to breath. Otherwise continues to wear O2 during day on pulsing and O2 concentrator at night. Desat on room air at rest today to 88%. CXR 07/17/13 IMPRESSION:  Sequential pacemaker in place. Post CABG. Cardiomegaly.  Calcified tortuous aorta.  Mild central pulmonary vascular prominence stable.  Basilar scarring/subsegmental atelectasis without segmental  consolidation.  Electronically Signed  By: Chauncey Cruel M.D.  On: 07/17/2013 16:32  11/07/13- 81 year old male former smoker, Veteran,  History of asthma, COPD, obstructive sleep apnea complicated by DM, obesity, CAD/ischemic CM/sick sinus/bradycardia tachycardia/ pacemaker, history of prostate cancer, anemia. CPAP 12 / O2 2L/ Advanced sleep. Dyspnea on exertion with pulse oxygen, okay at rest. He worries about being allergic to bushes around his home because he has a "bitter taste" in his mouth. Neighbor upstairs smokes and the odor bothers patient. Continues CPAP all night every night and believes it helps him Walk test on oxygen- 11/07/13- 3 laps x 185 feet, lowest sat 91 % on 2L pulsed O2  01/18/14- 81 year old male former smoker, Veteran,  History of asthma, COPD, obstructive sleep apnea complicated by DM, obesity, CAD/ischemic CM/sick sinus/bradycardia tachycardia/ pacemaker,  history of prostate cancer, anemia. CPAP 12 / O2 2L/  Advanced sleep. FOLLOWS FOR:  acute visit today.  sore throat x 2-3 weeks.  pt stated that when he uses his inhalers he is having burning in his chest and wheezing at times with the inhaler use.   05/09/14-81 year old male former smoker, Veteran,  History of asthma, COPD, obstructive sleep apnea complicated by DM, obesity, CAD/ischemic CM/sick sinus/bradycardia tachycardia/ pacemaker, history of prostate cancer, anemia.  CPAP 12 / O2 2L/ Advanced sleep and exertion FOLLOWS FOR: been to UC and given Doxycycline-completed and no better. Continues to have cough-yellow in color with chest congestion and wheezing. He liked [employer] but questions insurance coverage. Has had worse bronchitis for 3 months-cough with yellow mucus, wheeze, chest congestion. Comes and goes. No better after doxycycline. Also asks small portable oxygen concentrator instead of the portable oxygen system he is now using. CXR 02/09/14 IMPRESSION: Stable chronic findings with no change from 07/17/2013. Electronically Signed  By: Skipper Cliche M.D.  On: 02/09/2014 10:01  07/26/14- 81 year old male former smoker, Veteran,  History of asthma, COPD, obstructive sleep apnea complicated by DM, obesity, CAD/ischemic CM/sick sinus/bradycardia tachycardia/ pacemaker, history of prostate cancer, anemia. CPAP 12 / O2 2L/ Advanced sleep and exertion. FOLLOWS FOR: Continues O2; Pt states he is coughing up yellow and thick phelgm since last summer.  Has been using Robtussion DM and Mucinex; has hard time taling due to congestion. He was treated with Augmentin December 12 and doxycycline January 21. History of rash with Cipro. He does not recognize reflux or choking on food.  09/10/14- 81 year old male former smoker, Veteran,  History of asthma, COPD, obstructive sleep apnea complicated by DM, obesity, CAD/ischemic CM/sick sinus/bradycardia tachycardia/ pacemaker, history of prostate cancer, anemia.  CPAP 12 / O2 2L/ Advanced sleep and  exertion. FOLLOWS FOR: Pt states that breathing has been doing well. Using O2 @ 2L/min. Pt states that pollen affects his SOB some.  . Uses CPAP all night every night. Sometimes skips oxygen if he goes out shopping. Little cough or wheeze. He feels he is doing very well now. CXR 07/27/14- IMPRESSION: No edema or consolidation. Mild bibasilar scarring Electronically Signed  By: Lowella Grip III M.D.  On: 07/27/2014 08:55  ROS-see HPI Constitutional:   No-   weight loss, night sweats, fevers, chills, fatigue, lassitude. HEENT:   No-  headaches, difficulty swallowing, tooth/dental problems, sore throat,       No-  sneezing, itching, ear ache, nasal congestion, post nasal drip,  CV:  No- recent  chest pain, orthopnea, PND. +Mild chronic swelling in lower extremities,                            No- anasarca, dizziness, palpitations Resp: +  shortness of breath with exertion or at rest.               productive cough,   non-productive cough,  No- coughing up of blood.               change in color of mucus.  No- wheezing.   Skin: No-   rash or lesions. GI:  No-   heartburn, indigestion, abdominal pain, nausea, vomiting,  GU:  MS: . Limiting hip pain Neuro-     nothing unusual Psych:  No- change in mood or affect. No depression or anxiety.  No memory loss.  OBJ General- Alert, Oriented, Affect-appropriate, Distress- none acute, overweight, O2 2L Skin- rash-none,  lesions- none, excoriation- none Lymphadenopathy- none Head- atraumatic            Eyes- Gross vision intact, PERRLA, conjunctivae clear secretions            Ears- Hearing aids, HOH            Nose- Clear, no-Septal dev, mucus, polyps, erosion, perforation             Throat- Mallampati II , mucosa clear , drainage- none, tonsils- atrophic, +                                     dentures                     Neck- flexible , trachea midline, no stridor , thyroid nl, carotid no bruit Chest - symmetrical excursion ,  unlabored           Heart/CV- RRR +bigeminal pulse , no murmur , no gallop  , no rub, nl s1 s2                           - JVD- none , edema- none, stasis changes- none, varices- none           Lung- +few rhonchi, unlabored, wheeze- none, cough + harsh/ deep,   dullness-none, rub-  none. Raspy .           Chest wall- pacemaker left chest Abd- Br/ Gen/ Rectal- Not done, not indicated Extrem- cyanosis- none, clubbing, none, atrophy- none, strength- deconditioned, +cane Neuro- grossly intact to observation

## 2014-09-16 NOTE — Assessment & Plan Note (Signed)
Good compliance and control with CPAP. He is satisfied that CPAP helps and is necessary for him.

## 2014-09-16 NOTE — Assessment & Plan Note (Signed)
Good control at the present time. Oxygen remains medically necessary.

## 2014-09-18 DIAGNOSIS — M25512 Pain in left shoulder: Secondary | ICD-10-CM | POA: Diagnosis not present

## 2014-09-18 DIAGNOSIS — M7542 Impingement syndrome of left shoulder: Secondary | ICD-10-CM | POA: Diagnosis not present

## 2014-09-20 DIAGNOSIS — Z8546 Personal history of malignant neoplasm of prostate: Secondary | ICD-10-CM | POA: Diagnosis not present

## 2014-09-27 DIAGNOSIS — Z8546 Personal history of malignant neoplasm of prostate: Secondary | ICD-10-CM | POA: Diagnosis not present

## 2014-09-28 ENCOUNTER — Telehealth: Payer: Self-pay | Admitting: Internal Medicine

## 2014-09-28 NOTE — Telephone Encounter (Signed)
Patient Name: Eric [name] DOB: [birth year] Initial Comment Caller states he thinks he has a virus, spitting up mucus, coughing. Nurse Assessment Nurse: Vallery Sa, RN, Cathy Date/Time (Eastern Time): 09/28/2014 10:33:32 AM Confirm and document reason for call. If symptomatic, describe symptoms. ---Caller states he developed a productive cough 2 days ago and a headache today. No fever. Has the patient traveled out of the country within the last 30 days? ---No Does the patient require triage? ---Yes Related visit to physician within the last 2 weeks? ---No Does the PT have any chronic conditions? (i.e. diabetes, asthma, etc.) ---Yes List chronic conditions. ---High Blood Pressure, Diabetes, Diverticulitis Guidelines Guideline Title Affirmed Question Affirmed Notes Cough - Acute Productive Patient sounds very sick or weak to the triager very weak, on oxygen Final Disposition User Go to ED Now (or PCP triage) Vallery Sa, RN, [employer] shares he has COPD and is on nasal cannula oxygen. His SaO2 level is 97. Caller declined the Go to ER disposition. He requests to have a MD call him with direction regarding any changes in his medications or other direction. Called the office backline and Mickel Baas will have the MD or the MD's Nurse call him back.

## 2014-09-28 NOTE — Telephone Encounter (Signed)
I agree with "go to ER" instructions Thx

## 2014-09-28 NOTE — Telephone Encounter (Signed)
Juliann Pulse from [employer] called and talked with patient about him being on oxygen and he has developed a cough and has become weak. No fever at this point. She recommended he go to the ER but he refused and wants to talk with someone in this office to talk about maybe some changes he can make. Please call patient.

## 2014-09-28 NOTE — Telephone Encounter (Signed)
Please advise, thanks.

## 2014-09-29 ENCOUNTER — Ambulatory Visit (INDEPENDENT_AMBULATORY_CARE_PROVIDER_SITE_OTHER): Payer: Medicare Other | Admitting: Family Medicine

## 2014-09-29 ENCOUNTER — Encounter: Payer: Self-pay | Admitting: Family Medicine

## 2014-09-29 VITALS — BP 142/64 | HR 75 | Temp 97.5°F | Ht 66.0 in | Wt 210.0 lb

## 2014-09-29 DIAGNOSIS — I2510 Atherosclerotic heart disease of native coronary artery without angina pectoris: Secondary | ICD-10-CM | POA: Diagnosis not present

## 2014-09-29 DIAGNOSIS — J441 Chronic obstructive pulmonary disease with (acute) exacerbation: Secondary | ICD-10-CM | POA: Diagnosis not present

## 2014-09-29 MED ORDER — CLARITHROMYCIN 500 MG PO TABS: 500.0000 mg | ORAL_TABLET | Freq: Two times a day (BID) | ORAL | Status: DC

## 2014-09-29 NOTE — Progress Notes (Signed)
SUBJECTIVE:  Eric [name] is a 81 y.o. male who complains of productive cough. Patient did have some yellow sputum previously. Patient has now had this coffer greater than 14 days. Patient was seen by pulmonology previously and was given antibiotic that did clear this up. Patient was doing much better and unfortunately is having a repeat exacerbation he states. Patient does have a history of COPD that is, Katie by diabetes, obesity and coronary artery disease. Patient has been treated with many different anti-inflammatories and antibiotics previously without any significant success. Patient denies any fevers or chills but states that he just does not feel like himself. Patient is concerned as he does not want this infection to get to his lungs. Patient does have a very mild headache and states that his allergies have been worse recently. Denies any nighttime awakening.  Past Medical History  Diagnosis Date  . LACTOSE INTOLERANCE   . OBESITY   . CARDIOMYOPATHY, ISCHEMIC   . AORTIC SCLEROSIS   . SICK SINUS/ TACHY-BRADY SYNDROME 09/2007    s/p PPM st judes  . PERIPHERAL VASCULAR DISEASE   . CAROTID BRUIT, RIGHT 02/27/2008  . IBS (irritable bowel syndrome)   . ALLERGIC RHINITIS   . ANEMIA-NOS   . OA (osteoarthritis)   . COPD   . GERD   . HYPERLIPIDEMIA   . HIATAL HERNIA   . Diverticulosis   . Prostate cancer     seed implants 2004  . DIABETES MELLITUS-TYPE II     diet controlled  . CORONARY ARTERY DISEASE     CABG 1995, PTCA/DES 2008, 2009 and 08/2010  . HYPERTENSION   . Asthma   . Sleep apnea   . Partial small bowel obstruction   . Primary hyperparathyroidism     Lab Results Component Value Date  PTH 150.7* 02/13/2013  CALCIUM 11.0* 02/13/2013  CAION 1.21 03/15/2008    . SMALL BOWEL OBSTRUCTION 04/18/2009    Qualifier: History of  By: Asa Lente MD, Jannifer Rodney Cataract     surgery  . Symptomatic diverticulosis 01/18/2009    Qualifier: Diagnosis of  By: Trellis Paganini PA-c, Amy S   . Hx of  echocardiogram     Echo (9/15):  Mild LVH, EF 50-55%, no RWMA, Gr 1 DD, MAC, mild LAE.   Past Surgical History  Procedure Laterality Date  . Ptca  2008, 2009, 2012    with DES  . Partial small bowel obstruction  2009  . Bilateral cataracts    . Pacemaker insertion      DDD/[employer]         Last interrogation 2/13  on chart     Pacemaker guideline order Dr Tamala Julian on chart  . Inguinal hernia repair Bilateral   . Lumbar disc surgery  12/2008  . Coronary artery bypass graft    . Total hip arthroplasty  08/21/2011    Procedure: TOTAL HIP ARTHROPLASTY;  Surgeon: Johnn Hai, MD;  Location: [facility];  Service: Orthopedics;  Laterality: Right;  . Colonoscopy    . Esophagogastroduodenoscopy  multiple  . Penile prosthesis placement    . Back surgery    . Colon surgery    . Flexible sigmoidoscopy N/A 09/22/2013    Procedure: FLEXIBLE SIGMOIDOSCOPY;  Surgeon: Gatha Mayer, MD;  Location: [facility];  Service: Endoscopy;  Laterality: N/A;   History   Social History  . Marital Status: Widowed    Spouse Name: N/A  . Number of Children: N/A  .  Years of Education: N/A   Occupational History  . retired    Social History Main Topics  . Smoking status: Former Smoker    Quit date: 06/09/1995  . Smokeless tobacco: Never Used  . Alcohol Use: No  . Drug Use: No  . Sexual Activity: Not on file   Other Topics Concern  . Not on file   Social History Narrative   Family History  Problem Relation Age of Onset  . Colon cancer Neg Hx   . Hypertension Mother   . Heart attack    . Heart attack    . Stroke Neg Hx   . Heart attack Brother   . Cancer Mother    Allergies  Allergen Reactions  . Actos [Pioglitazone Hydrochloride] Other (See Comments)    "felt funny, drowsy, and weak":  . Celebrex [Celecoxib] Other (See Comments)    "felt funny"  . Demerol Palpitations and Other (See Comments)    Increased BP  . Morphine And Related Nausea And Vomiting  . Ciprofloxacin Other (See  Comments)    arthralgia  . Metformin Nausea And Vomiting  . Zocor [Simvastatin] Other (See Comments)    Makes pt very drowsy      OBJECTIVE: General- Alert, Oriented, Affect-appropriate, Distress- none acute, overweight, O2 2 L Skin- rash-none, lesions- none, excoriation- none Lymphadenopathy- none Head- atraumatic  Eyes- Gross vision intact, PERRLA, conjunctivae clear secretions  Ears- Hearing aids, HOH  Nose- Clear, no-Septal dev, mucus, polyps, erosion, perforation   Throat- Mallampati II , mucosa clear , drainage- none, tonsils- atrophic, +  dentures  Neck- flexible , trachea midline, no stridor , thyroid nl, carotid no bruit Chest - symmetrical excursion , unlabored  Heart/CV- RRR +bigeminal pulse , no murmur , no gallop , no rub, nl s1 s2  - JVD- none , edema- none, stasis changes- none, varices- none  Lung- +few rhonchi, unlabored, wheeze- none, cough + harsh/ deep, dullness-none, rub- none. Raspy . No focal findings.   Chest wall- pacemaker left chest Abd- Br/ Gen/ Rectal- Not done, not indicated Extrem- cyanosis- none, clubbing, none, atrophy- none, strength- deconditioned, +cane Neuro- grossly intact to observation  ASSESSMENT:  COPD exacerbation.   PLAN: Symptomatic therapy suggested: push fluids, rest and return office visit prn if symptoms persist or worsen. ABX given and discussed treating secondary to patient's other comorbidities  him. Call or return to clinic prn if these symptoms worsen or fail to improve as anticipated. Good pulse ox so no prednisone given.

## 2014-09-29 NOTE — Patient Instructions (Signed)
Good to see you.  Biaxin 2 times daily for next 14 days, this is what pulmonary used before.  Continue your inhalers See Dr. Annamaria Boots or Dr. Doug Sou in next 2 weeks if not better.   Chronic Obstructive Pulmonary Disease Chronic obstructive pulmonary disease (COPD) is a common lung condition in which airflow from the lungs is limited. COPD is a general term that can be used to describe many different lung problems that limit airflow, including both chronic bronchitis and emphysema. If you have COPD, your lung function will probably never return to normal, but there are measures you can take to improve lung function and make yourself feel better.  CAUSES   Smoking (common).   Exposure to secondhand smoke.   Genetic problems.  Chronic inflammatory lung diseases or recurrent infections. SYMPTOMS   Shortness of breath, especially with physical activity.   Deep, persistent (chronic) cough with a large amount of thick mucus.   Wheezing.   Rapid breaths (tachypnea).   Gray or bluish discoloration (cyanosis) of the skin, especially in fingers, toes, or lips.   Fatigue.   Weight loss.   Frequent infections or episodes when breathing symptoms become much worse (exacerbations).   Chest tightness. DIAGNOSIS  Your health care provider will take a medical history and perform a physical examination to make the initial diagnosis. Additional tests for COPD may include:   Lung (pulmonary) function tests.  Chest X-ray.  CT scan.  Blood tests. TREATMENT  Treatment available to help you feel better when you have COPD includes:   Inhaler and nebulizer medicines. These help manage the symptoms of COPD and make your breathing more comfortable.  Supplemental oxygen. Supplemental oxygen is only helpful if you have a low oxygen level in your blood.   Exercise and physical activity. These are beneficial for nearly all people with COPD. Some people may also benefit from a pulmonary  rehabilitation program. HOME CARE INSTRUCTIONS   Take all medicines (inhaled or pills) as directed by your health care provider.  Avoid over-the-counter medicines or cough syrups that dry up your airway (such as antihistamines) and slow down the elimination of secretions unless instructed otherwise by your health care provider.   If you are a smoker, the most important thing that you can do is stop smoking. Continuing to smoke will cause further lung damage and breathing trouble. Ask your health care provider for help with quitting smoking. He or she can direct you to community resources or hospitals that provide support.  Avoid exposure to irritants such as smoke, chemicals, and fumes that aggravate your breathing.  Use oxygen therapy and pulmonary rehabilitation if directed by your health care provider. If you require home oxygen therapy, ask your health care provider whether you should purchase a pulse oximeter to measure your oxygen level at home.   Avoid contact with individuals who have a contagious illness.  Avoid extreme temperature and humidity changes.  Eat healthy foods. Eating smaller, more frequent meals and resting before meals may help you maintain your strength.  Stay active, but balance activity with periods of rest. Exercise and physical activity will help you maintain your ability to do things you want to do.  Preventing infection and hospitalization is very important when you have COPD. Make sure to receive all the vaccines your health care provider recommends, especially the pneumococcal and influenza vaccines. Ask your health care provider whether you need a pneumonia vaccine.  Learn and use relaxation techniques to manage stress.  Learn and use  controlled breathing techniques as directed by your health care provider. Controlled breathing techniques include:   Pursed lip breathing. Start by breathing in (inhaling) through your nose for 1 second. Then, purse your  lips as if you were going to whistle and breathe out (exhale) through the pursed lips for 2 seconds.   Diaphragmatic breathing. Start by putting one hand on your abdomen just above your waist. Inhale slowly through your nose. The hand on your abdomen should move out. Then purse your lips and exhale slowly. You should be able to feel the hand on your abdomen moving in as you exhale.   Learn and use controlled coughing to clear mucus from your lungs. Controlled coughing is a series of short, progressive coughs. The steps of controlled coughing are:  1. Lean your head slightly forward.  2. Breathe in deeply using diaphragmatic breathing.  3. Try to hold your breath for 3 seconds.  4. Keep your mouth slightly open while coughing twice.  5. Spit any mucus out into a tissue.  6. Rest and repeat the steps once or twice as needed. SEEK MEDICAL CARE IF:   You are coughing up more mucus than usual.   There is a change in the color or thickness of your mucus.   Your breathing is more labored than usual.   Your breathing is faster than usual.  SEEK IMMEDIATE MEDICAL CARE IF:   You have shortness of breath while you are resting.   You have shortness of breath that prevents you from:  Being able to talk.   Performing your usual physical activities.   You have chest pain lasting longer than 5 minutes.   Your skin color is more cyanotic than usual.  You measure low oxygen saturations for longer than 5 minutes with a pulse oximeter. MAKE SURE YOU:   Understand these instructions.  Will watch your condition.  Will get help right away if you are not doing well or get worse. Document Released: 03/04/2005 Document Revised: 10/09/2013 Document Reviewed: 01/19/2013 [facility] Patient Information 2015 [place], [place]. This information is not intended to replace advice given to you by your health care provider. Make sure you discuss any questions you have with your health care  provider.

## 2014-09-29 NOTE — Telephone Encounter (Signed)
Pt came in to Saturday clinic.

## 2014-09-29 NOTE — Progress Notes (Signed)
Pre visit review using our clinic review tool, if applicable. No additional management support is needed unless otherwise documented below in the visit note. 

## 2014-10-01 ENCOUNTER — Telehealth: Payer: Self-pay | Admitting: *Deleted

## 2014-10-01 NOTE — Telephone Encounter (Signed)
[facility] - Client [facility] Patient Name: Eric [name] Gender: Male DOB: 01-09-1927 Age: 81 Y 33 M 26 D Return Phone Number: [phone] (Primary) Address: City/State/Zip: [place] Client [facility] - Client Client Site [place] - Day Physician [place], [place] Type Call Call Type Triage / Clinical Caller Name Garrell Flagg Relationship To Patient Self Appointment Disposition EMR Appointment Not Necessary Info pasted into Epic Yes Return Phone Number [phone] (Primary) Chief Complaint Cough Initial Comment Caller states he thinks he has a virus, spitting up mucus, coughing. PreDisposition Call Doctor Nurse Assessment Nurse: Vallery Sa, RN, [place] Date/Time Eilene Ghazi Time): 09/28/2014 10:33:32 AM Confirm and document reason for call. If symptomatic, describe symptoms. ---Caller states he developed a productive cough 2 days ago and a headache today. No fever. Has the patient traveled out of the country within the last 30 days? ---No Does the patient require triage? ---Yes Related visit to physician within the last 2 weeks? ---No Does the PT have any chronic conditions? (i.e. diabetes, asthma, etc.) ---Yes List chronic conditions. ---High Blood Pressure, Diabetes, Diverticulitis Guidelines Guideline Title Affirmed Question Affirmed Notes Nurse Date/Time (Eastern Time) Cough - Acute Productive Patient sounds very sick or weak to the triager very weak, on oxygen Vallery Sa, RN, Cathy 09/28/2014 10:35:05 AM Disp. Time Eilene Ghazi Time) Disposition Final User 09/28/2014 10:41:18 AM Go to ED Now (or PCP triage) Yes [place], RN, Rosey Bath Understands: Yes Disagree/Comply: Disagree Disagree/Comply Reason: Disagree with instructions PLEASE NOTE: All timestamps contained within this report are represented as [place] Standard Time. CONFIDENTIALTY NOTICE: This fax transmission is  intended only for the addressee. It contains information that is legally privileged, confidential or otherwise protected from use or disclosure. If you are not the intended recipient, you are strictly prohibited from reviewing, disclosing, copying using or disseminating any of this information or taking any action in reliance on or regarding this information. If you have received this fax in error, please notify us immediately by telephone so that we can arrange for its return to [place]. Phone: [phone], Toll-Free: [phone], Fax: [phone] Page: 2 of 2 Call Id: [id number] Care Advice Given Per Guideline GO TO ED NOW (OR PCP TRIAGE): * IF NO PCP TRIAGE: You need to be seen. Go to the [facility] at [facility] within the next hour. Leave as soon as you can. CALL EMS 911 IF: * Severe difficulty breathing occurs * Lips or face turns blue * Passes out or becomes confused. CARE ADVICE given per Cough - Acute Productive (Adult) guideline. After Care Instructions Given Call Event Type User Date / Time Description Comments User: Berton Mount, RN Date/Time Eilene Ghazi Time): 09/28/2014 10:36:08 AM Caller shares he has COPD and is on nasal cannula oxygen. His SaO2 level is 97. User: Berton Mount, RN Date/Time Eilene Ghazi Time): 09/28/2014 10:44:21 AM Caller declined the Go to ER disposition. He requests to have a MD call him with direction regarding any changes in his medications or other direction. User: Berton Mount, RN Date/Time Eilene Ghazi Time): 09/28/2014 10:45:28 AM Called the office backline and Mickel Baas will have the MD or the MD's Nurse call him back. Referrals GO TO FACILITY REFUSED

## 2014-10-15 ENCOUNTER — Ambulatory Visit: Payer: Medicare Other | Admitting: Internal Medicine

## 2014-10-15 DIAGNOSIS — H02052 Trichiasis without entropian right lower eyelid: Secondary | ICD-10-CM | POA: Diagnosis not present

## 2014-10-15 DIAGNOSIS — H02051 Trichiasis without entropian right upper eyelid: Secondary | ICD-10-CM | POA: Diagnosis not present

## 2014-10-15 DIAGNOSIS — H02054 Trichiasis without entropian left upper eyelid: Secondary | ICD-10-CM | POA: Diagnosis not present

## 2014-10-15 DIAGNOSIS — H02055 Trichiasis without entropian left lower eyelid: Secondary | ICD-10-CM | POA: Diagnosis not present

## 2014-10-16 DIAGNOSIS — H6123 Impacted cerumen, bilateral: Secondary | ICD-10-CM | POA: Diagnosis not present

## 2014-10-16 DIAGNOSIS — H908 Mixed conductive and sensorineural hearing loss, unspecified: Secondary | ICD-10-CM | POA: Diagnosis not present

## 2014-10-16 DIAGNOSIS — H7203 Central perforation of tympanic membrane, bilateral: Secondary | ICD-10-CM | POA: Diagnosis not present

## 2014-10-25 ENCOUNTER — Ambulatory Visit: Payer: Medicare Other | Admitting: Internal Medicine

## 2014-10-31 DIAGNOSIS — I495 Sick sinus syndrome: Secondary | ICD-10-CM | POA: Diagnosis not present

## 2014-10-31 DIAGNOSIS — Z950 Presence of cardiac pacemaker: Secondary | ICD-10-CM | POA: Diagnosis not present

## 2014-11-21 ENCOUNTER — Encounter (HOSPITAL_COMMUNITY): Payer: Self-pay | Admitting: *Deleted

## 2014-11-21 ENCOUNTER — Telehealth: Payer: Self-pay | Admitting: Internal Medicine

## 2014-11-21 ENCOUNTER — Emergency Department (HOSPITAL_COMMUNITY): Payer: Medicare Other

## 2014-11-21 ENCOUNTER — Inpatient Hospital Stay (HOSPITAL_COMMUNITY)
Admission: EM | Admit: 2014-11-21 | Discharge: 2014-12-04 | DRG: 330 | Disposition: A | Discharge status: Skilled Nursing Facility | Payer: Medicare Other | Attending: Internal Medicine | Admitting: Internal Medicine

## 2014-11-21 DIAGNOSIS — K5732 Diverticulitis of large intestine without perforation or abscess without bleeding: Secondary | ICD-10-CM | POA: Diagnosis not present

## 2014-11-21 DIAGNOSIS — J9811 Atelectasis: Secondary | ICD-10-CM | POA: Diagnosis not present

## 2014-11-21 DIAGNOSIS — N281 Cyst of kidney, acquired: Secondary | ICD-10-CM | POA: Diagnosis not present

## 2014-11-21 DIAGNOSIS — J449 Chronic obstructive pulmonary disease, unspecified: Secondary | ICD-10-CM | POA: Diagnosis present

## 2014-11-21 DIAGNOSIS — R488 Other symbolic dysfunctions: Secondary | ICD-10-CM | POA: Diagnosis not present

## 2014-11-21 DIAGNOSIS — K219 Gastro-esophageal reflux disease without esophagitis: Secondary | ICD-10-CM | POA: Diagnosis present

## 2014-11-21 DIAGNOSIS — Z6835 Body mass index (BMI) 35.0-35.9, adult: Secondary | ICD-10-CM | POA: Diagnosis not present

## 2014-11-21 DIAGNOSIS — R100 Acute abdomen: Secondary | ICD-10-CM | POA: Diagnosis not present

## 2014-11-21 DIAGNOSIS — Z87891 Personal history of nicotine dependence: Secondary | ICD-10-CM | POA: Diagnosis not present

## 2014-11-21 DIAGNOSIS — Z4682 Encounter for fitting and adjustment of non-vascular catheter: Secondary | ICD-10-CM | POA: Diagnosis not present

## 2014-11-21 DIAGNOSIS — I495 Sick sinus syndrome: Secondary | ICD-10-CM | POA: Diagnosis not present

## 2014-11-21 DIAGNOSIS — I5030 Unspecified diastolic (congestive) heart failure: Secondary | ICD-10-CM | POA: Diagnosis not present

## 2014-11-21 DIAGNOSIS — D72829 Elevated white blood cell count, unspecified: Secondary | ICD-10-CM | POA: Diagnosis not present

## 2014-11-21 DIAGNOSIS — I10 Essential (primary) hypertension: Secondary | ICD-10-CM | POA: Diagnosis not present

## 2014-11-21 DIAGNOSIS — K5720 Diverticulitis of large intestine with perforation and abscess without bleeding: Principal | ICD-10-CM | POA: Diagnosis present

## 2014-11-21 DIAGNOSIS — I519 Heart disease, unspecified: Secondary | ICD-10-CM | POA: Diagnosis not present

## 2014-11-21 DIAGNOSIS — E669 Obesity, unspecified: Secondary | ICD-10-CM | POA: Diagnosis present

## 2014-11-21 DIAGNOSIS — Z8546 Personal history of malignant neoplasm of prostate: Secondary | ICD-10-CM

## 2014-11-21 DIAGNOSIS — G8918 Other acute postprocedural pain: Secondary | ICD-10-CM | POA: Diagnosis present

## 2014-11-21 DIAGNOSIS — R2681 Unsteadiness on feet: Secondary | ICD-10-CM | POA: Diagnosis not present

## 2014-11-21 DIAGNOSIS — G4733 Obstructive sleep apnea (adult) (pediatric): Secondary | ICD-10-CM | POA: Diagnosis not present

## 2014-11-21 DIAGNOSIS — E119 Type 2 diabetes mellitus without complications: Secondary | ICD-10-CM | POA: Diagnosis not present

## 2014-11-21 DIAGNOSIS — Z980 Intestinal bypass and anastomosis status: Secondary | ICD-10-CM

## 2014-11-21 DIAGNOSIS — R05 Cough: Secondary | ICD-10-CM | POA: Diagnosis not present

## 2014-11-21 DIAGNOSIS — Z96641 Presence of right artificial hip joint: Secondary | ICD-10-CM | POA: Diagnosis present

## 2014-11-21 DIAGNOSIS — M549 Dorsalgia, unspecified: Secondary | ICD-10-CM | POA: Diagnosis present

## 2014-11-21 DIAGNOSIS — I2510 Atherosclerotic heart disease of native coronary artery without angina pectoris: Secondary | ICD-10-CM | POA: Diagnosis not present

## 2014-11-21 DIAGNOSIS — J189 Pneumonia, unspecified organism: Secondary | ICD-10-CM

## 2014-11-21 DIAGNOSIS — H9190 Unspecified hearing loss, unspecified ear: Secondary | ICD-10-CM | POA: Diagnosis present

## 2014-11-21 DIAGNOSIS — Z950 Presence of cardiac pacemaker: Secondary | ICD-10-CM

## 2014-11-21 DIAGNOSIS — K868 Other specified diseases of pancreas: Secondary | ICD-10-CM | POA: Diagnosis not present

## 2014-11-21 DIAGNOSIS — E785 Hyperlipidemia, unspecified: Secondary | ICD-10-CM | POA: Diagnosis not present

## 2014-11-21 DIAGNOSIS — Z9049 Acquired absence of other specified parts of digestive tract: Secondary | ICD-10-CM | POA: Diagnosis not present

## 2014-11-21 DIAGNOSIS — I255 Ischemic cardiomyopathy: Secondary | ICD-10-CM | POA: Diagnosis present

## 2014-11-21 DIAGNOSIS — M6281 Muscle weakness (generalized): Secondary | ICD-10-CM | POA: Diagnosis not present

## 2014-11-21 DIAGNOSIS — I25810 Atherosclerosis of coronary artery bypass graft(s) without angina pectoris: Secondary | ICD-10-CM | POA: Diagnosis present

## 2014-11-21 DIAGNOSIS — K5792 Diverticulitis of intestine, part unspecified, without perforation or abscess without bleeding: Secondary | ICD-10-CM | POA: Diagnosis present

## 2014-11-21 DIAGNOSIS — E871 Hypo-osmolality and hyponatremia: Secondary | ICD-10-CM | POA: Diagnosis not present

## 2014-11-21 DIAGNOSIS — R1032 Left lower quadrant pain: Secondary | ICD-10-CM | POA: Diagnosis not present

## 2014-11-21 DIAGNOSIS — Z8249 Family history of ischemic heart disease and other diseases of the circulatory system: Secondary | ICD-10-CM | POA: Diagnosis not present

## 2014-11-21 DIAGNOSIS — K5741 Diverticulitis of both small and large intestine with perforation and abscess with bleeding: Secondary | ICD-10-CM | POA: Diagnosis not present

## 2014-11-21 DIAGNOSIS — I110 Hypertensive heart disease with heart failure: Secondary | ICD-10-CM | POA: Diagnosis present

## 2014-11-21 DIAGNOSIS — M47816 Spondylosis without myelopathy or radiculopathy, lumbar region: Secondary | ICD-10-CM | POA: Diagnosis not present

## 2014-11-21 DIAGNOSIS — Z794 Long term (current) use of insulin: Secondary | ICD-10-CM

## 2014-11-21 DIAGNOSIS — M47817 Spondylosis without myelopathy or radiculopathy, lumbosacral region: Secondary | ICD-10-CM | POA: Diagnosis not present

## 2014-11-21 DIAGNOSIS — I739 Peripheral vascular disease, unspecified: Secondary | ICD-10-CM | POA: Diagnosis present

## 2014-11-21 DIAGNOSIS — K5780 Diverticulitis of intestine, part unspecified, with perforation and abscess without bleeding: Secondary | ICD-10-CM

## 2014-11-21 DIAGNOSIS — R0602 Shortness of breath: Secondary | ICD-10-CM | POA: Diagnosis not present

## 2014-11-21 DIAGNOSIS — I5032 Chronic diastolic (congestive) heart failure: Secondary | ICD-10-CM | POA: Diagnosis present

## 2014-11-21 DIAGNOSIS — I429 Cardiomyopathy, unspecified: Secondary | ICD-10-CM | POA: Diagnosis not present

## 2014-11-21 DIAGNOSIS — K7689 Other specified diseases of liver: Secondary | ICD-10-CM | POA: Diagnosis not present

## 2014-11-21 HISTORY — DX: Diverticulitis of large intestine with perforation and abscess without bleeding: K57.20

## 2014-11-21 HISTORY — DX: Hypo-osmolality and hyponatremia: E87.1

## 2014-11-21 HISTORY — DX: Other ill-defined heart diseases: I51.89

## 2014-11-21 LAB — COMPREHENSIVE METABOLIC PANEL
ALK PHOS: 78 U/L (ref 38–126)
ALT: 23 U/L (ref 17–63)
AST: 26 U/L (ref 15–41)
Albumin: 4.4 g/dL (ref 3.5–5.0)
Anion gap: 9 (ref 5–15)
BUN: 23 mg/dL — ABNORMAL HIGH (ref 6–20)
CHLORIDE: 98 mmol/L — AB (ref 101–111)
CO2: 23 mmol/L (ref 22–32)
Calcium: 11.1 mg/dL — ABNORMAL HIGH (ref 8.9–10.3)
Creatinine, Ser: 0.95 mg/dL (ref 0.61–1.24)
GFR calc Af Amer: >60 mL/min (ref >60)
Glucose, Bld: 157 mg/dL — ABNORMAL HIGH (ref 65–99)
Potassium: 4.6 mmol/L (ref 3.5–5.1)
SODIUM: 130 mmol/L — AB (ref 135–145)
Total Bilirubin: 1.2 mg/dL (ref 0.3–1.2)
Total Protein: 7.5 g/dL (ref 6.5–8.1)

## 2014-11-21 LAB — LACTIC ACID, PLASMA: LACTIC ACID, VENOUS: 1.2 mmol/L (ref 0.5–2.0)

## 2014-11-21 LAB — CBC WITH DIFFERENTIAL/PLATELET
Basophils Absolute: 0 10*3/uL (ref 0.0–0.1)
Basophils Relative: 0 % (ref 0–1)
EOS ABS: 0 10*3/uL (ref 0.0–0.7)
EOS PCT: 0 % (ref 0–5)
HEMATOCRIT: 39.7 % (ref 39.0–52.0)
Hemoglobin: 13 g/dL (ref 13.0–17.0)
LYMPHS ABS: 0.9 10*3/uL (ref 0.7–4.0)
Lymphocytes Relative: 4 % — ABNORMAL LOW (ref 12–46)
MCH: 32 pg (ref 26.0–34.0)
MCHC: 32.7 g/dL (ref 30.0–36.0)
MCV: 97.8 fL (ref 78.0–100.0)
MONO ABS: 1 10*3/uL (ref 0.1–1.0)
Monocytes Relative: 5 % (ref 3–12)
Neutro Abs: 18.2 10*3/uL — ABNORMAL HIGH (ref 1.7–7.7)
Neutrophils Relative %: 91 % — ABNORMAL HIGH (ref 43–77)
PLATELETS: 223 10*3/uL (ref 150–400)
RBC: 4.06 MIL/uL — AB (ref 4.22–5.81)
RDW: 12.7 % (ref 11.5–15.5)
WBC: 20.1 10*3/uL — ABNORMAL HIGH (ref 4.0–10.5)

## 2014-11-21 LAB — URINALYSIS, ROUTINE W REFLEX MICROSCOPIC
BILIRUBIN URINE: NEGATIVE
GLUCOSE, UA: NEGATIVE mg/dL
HGB URINE DIPSTICK: NEGATIVE
Ketones, ur: NEGATIVE mg/dL
Leukocytes, UA: NEGATIVE
Nitrite: NEGATIVE
PROTEIN: NEGATIVE mg/dL
Specific Gravity, Urine: 1.014 (ref 1.005–1.030)
Urobilinogen, UA: 1 mg/dL (ref 0.0–1.0)
pH: 6.5 (ref 5.0–8.0)

## 2014-11-21 LAB — I-STAT CG4 LACTIC ACID, ED: Lactic Acid, Venous: 1.26 mmol/L (ref 0.5–2.0)

## 2014-11-21 LAB — LIPASE, BLOOD

## 2014-11-21 MED ORDER — METRONIDAZOLE IN NACL 5-0.79 MG/ML-% IV SOLN: 500.0000 mg | Freq: Once | INTRAVENOUS | Status: DC

## 2014-11-21 MED ORDER — ISOSORBIDE MONONITRATE ER 30 MG PO TB24: 30.0000 mg | ORAL_TABLET | Freq: Every evening | ORAL | Status: DC

## 2014-11-21 MED ORDER — SODIUM CHLORIDE 0.9 % IJ SOLN
3.0000 mL | Freq: Two times a day (BID) | INTRAMUSCULAR | Status: DC
  Administered 2014-11-22 – 2014-12-03 (×9): 3 mL via INTRAVENOUS

## 2014-11-21 MED ORDER — ONDANSETRON HCL 4 MG PO TABS: 4.0000 mg | ORAL_TABLET | Freq: Four times a day (QID) | ORAL | Status: DC | PRN

## 2014-11-21 MED ORDER — ONDANSETRON HCL 4 MG/2ML IJ SOLN
4.0000 mg | Freq: Four times a day (QID) | INTRAMUSCULAR | Status: DC | PRN
  Administered 2014-11-24 (×2): 4 mg via INTRAVENOUS
  Filled 2014-11-21 (×2): qty 2

## 2014-11-21 MED ORDER — IOHEXOL 300 MG/ML  SOLN
100.0000 mL | Freq: Once | INTRAMUSCULAR | Status: AC | PRN
  Administered 2014-11-21: 100 mL via INTRAVENOUS

## 2014-11-21 MED ORDER — ALBUTEROL SULFATE (2.5 MG/3ML) 0.083% IN NEBU
2.5000 mg | INHALATION_SOLUTION | RESPIRATORY_TRACT | Status: DC | PRN
  Administered 2014-11-23 – 2014-12-02 (×8): 2.5 mg via RESPIRATORY_TRACT
  Filled 2014-11-21 (×8): qty 3

## 2014-11-21 MED ORDER — HYDROMORPHONE HCL 1 MG/ML IJ SOLN
1.0000 mg | INTRAMUSCULAR | Status: DC | PRN
  Administered 2014-11-22 – 2014-11-28 (×17): 1 mg via INTRAVENOUS
  Filled 2014-11-21 (×18): qty 1

## 2014-11-21 MED ORDER — PIPERACILLIN-TAZOBACTAM 3.375 G IVPB
3.3750 g | Freq: Three times a day (TID) | INTRAVENOUS | Status: DC
  Administered 2014-11-22 – 2014-11-26 (×14): 3.375 g via INTRAVENOUS
  Filled 2014-11-21 (×17): qty 50

## 2014-11-21 MED ORDER — INSULIN ASPART 100 UNIT/ML ~~LOC~~ SOLN
0.0000 [IU] | SUBCUTANEOUS | Status: DC
  Administered 2014-11-22: 1 [IU] via SUBCUTANEOUS
  Administered 2014-11-22: 2 [IU] via SUBCUTANEOUS
  Administered 2014-11-22 (×2): 1 [IU] via SUBCUTANEOUS
  Administered 2014-11-22: 2 [IU] via SUBCUTANEOUS
  Administered 2014-11-22: 1 [IU] via SUBCUTANEOUS
  Administered 2014-11-23: 2 [IU] via SUBCUTANEOUS
  Administered 2014-11-23 – 2014-12-02 (×16): 1 [IU] via SUBCUTANEOUS
  Administered 2014-12-03: 2 [IU] via SUBCUTANEOUS

## 2014-11-21 MED ORDER — PIPERACILLIN-TAZOBACTAM 3.375 G IVPB
3.3750 g | Freq: Once | INTRAVENOUS | Status: AC
  Administered 2014-11-21: 3.375 g via INTRAVENOUS
  Filled 2014-11-21: qty 50

## 2014-11-21 MED ORDER — SODIUM CHLORIDE 0.9 % IV SOLN
INTRAVENOUS | Status: AC
  Administered 2014-11-21: via INTRAVENOUS

## 2014-11-21 NOTE — ED Notes (Addendum)
CT updated about finished contrast and IV present. Pt next for CT. Pt updated. Rates pain 2/10.

## 2014-11-21 NOTE — ED Notes (Signed)
Dr. Roel Cluck at [facility].

## 2014-11-21 NOTE — ED Provider Notes (Signed)
Plains of left lower quadrant abdominal pain, nonradiating onset 2 days ago. Pain improving with time. On exam patient is alert not ill-appearing. Abdomen nondistended, minimal tenderness at left lower quadrant. Genitalia normal male  Orlie Dakin, MD 11/21/14 [id number]

## 2014-11-21 NOTE — ED Notes (Signed)
Pt alert, NAD, calm, interactive, IV established, rainbow held, no dyspnea noted, speech clear, pending CT. VSS.

## 2014-11-21 NOTE — H&P (Signed)
PCP: [name], MD    Referring provider Tatyana   Chief Complaint:   Abdominal pain  HPI: Eric [name] is a 81 y.o. male   has a past medical history of LACTOSE INTOLERANCE; OBESITY; CARDIOMYOPATHY, ISCHEMIC; AORTIC SCLEROSIS; SICK SINUS/ TACHY-BRADY SYNDROME (09/2007); PERIPHERAL VASCULAR DISEASE; CAROTID BRUIT, RIGHT (02/27/2008); IBS (irritable bowel syndrome); ALLERGIC RHINITIS; ANEMIA-NOS; OA (osteoarthritis); COPD; GERD; HYPERLIPIDEMIA; HIATAL HERNIA; Diverticulosis; Prostate cancer; DIABETES MELLITUS-TYPE II; CORONARY ARTERY DISEASE; HYPERTENSION; Asthma; Sleep apnea; Partial small bowel obstruction; Primary hyperparathyroidism; SMALL BOWEL OBSTRUCTION (04/18/2009); Cataract; Symptomatic diverticulosis (01/18/2009); and echocardiogram.   Presented with 1 week of  left lower quadrant pain  but worse in the past 24 hours  and diarrhea. He took some Imodium and diarrhea has stopped. But he is felt that his abdomen felt swollen he was unable to button up his pants. Patient did not endorse any fever, no nausea no vomiting no chest pain. he called in to his primary care provider and was instructed to come to emergency department. CT scan was done showing Sigmoid colonic diverticulitis with large intramural air collection within the anterior wall of the affected sigmoid segment concerning for contained micro perforation. Noted to have leukocytosis atributed to recent steroid injection for back pain.  Surgical consult has been requested. Zosyn was started in the emergency department.  Hospitalist was called for admission for diverticulitis  Review of Systems:    Pertinent positives include:  abdominal pain,  Constitutional:  No weight loss, night sweats, Fevers, chills, fatigue, weight loss  HEENT:  No headaches, Difficulty swallowing,Tooth/dental problems,Sore throat,  No sneezing, itching, ear ache, nasal congestion, post nasal drip,  Cardio-vascular:  No chest pain,  Orthopnea, PND, anasarca, dizziness, palpitations.no Bilateral lower extremity swelling  GI:  No heartburn, indigestion,  nausea, vomiting, diarrhea, change in bowel habits, loss of appetite, melena, blood in stool, hematemesis Resp:  no shortness of breath at rest. No dyspnea on exertion, No excess mucus, no productive cough, No non-productive cough, No coughing up of blood.No change in color of mucus.No wheezing. Skin:  no rash or lesions. No jaundice GU:  no dysuria, change in color of urine, no urgency or frequency. No straining to urinate.  No flank pain.  Musculoskeletal:  No joint pain or no joint swelling. No decreased range of motion. No back pain.  Psych:  No change in mood or affect. No depression or anxiety. No memory loss.  Neuro: no localizing neurological complaints, no tingling, no weakness, no double vision, no gait abnormality, no slurred speech, no confusion  Otherwise ROS are negative except for above, 10 systems were reviewed  Past Medical History: Past Medical History  Diagnosis Date  . LACTOSE INTOLERANCE   . OBESITY   . CARDIOMYOPATHY, ISCHEMIC   . AORTIC SCLEROSIS   . SICK SINUS/ TACHY-BRADY SYNDROME 09/2007    s/p PPM st judes  . PERIPHERAL VASCULAR DISEASE   . CAROTID BRUIT, RIGHT 02/27/2008  . IBS (irritable bowel syndrome)   . ALLERGIC RHINITIS   . ANEMIA-NOS   . OA (osteoarthritis)   . COPD   . GERD   . HYPERLIPIDEMIA   . HIATAL HERNIA   . Diverticulosis   . Prostate cancer     seed implants 2004  . DIABETES MELLITUS-TYPE II     diet controlled  . CORONARY ARTERY DISEASE     CABG 1995, PTCA/DES 2008, 2009 and 08/2010  . HYPERTENSION   . Asthma   . Sleep apnea   . Partial small  bowel obstruction   . Primary hyperparathyroidism     Lab Results Component Value Date  PTH 150.7* 02/13/2013  CALCIUM 11.0* 02/13/2013  CAION 1.21 03/15/2008    . SMALL BOWEL OBSTRUCTION 04/18/2009    Qualifier: History of  By: Asa Lente MD, Jannifer Rodney Cataract      surgery  . Symptomatic diverticulosis 01/18/2009    Qualifier: Diagnosis of  By: Trellis Paganini PA-c, Amy S   . Hx of echocardiogram     Echo (9/15):  Mild LVH, EF 50-55%, no RWMA, Gr 1 DD, MAC, mild LAE.   Past Surgical History  Procedure Laterality Date  . Ptca  2008, 2009, 2012    with DES  . Partial small bowel obstruction  2009  . Bilateral cataracts    . Pacemaker insertion      DDD/[employer]         Last interrogation 2/13  on chart     Pacemaker guideline order Dr Tamala Julian on chart  . Inguinal hernia repair Bilateral   . Lumbar disc surgery  12/2008  . Coronary artery bypass graft    . Total hip arthroplasty  08/21/2011    Procedure: TOTAL HIP ARTHROPLASTY;  Surgeon: Johnn Hai, MD;  Location: [facility];  Service: Orthopedics;  Laterality: Right;  . Colonoscopy    . Esophagogastroduodenoscopy  multiple  . Penile prosthesis placement    . Back surgery    . Colon surgery    . Flexible sigmoidoscopy N/A 09/22/2013    Procedure: FLEXIBLE SIGMOIDOSCOPY;  Surgeon: Gatha Mayer, MD;  Location: [facility];  Service: Endoscopy;  Laterality: N/A;     Medications: Prior to Admission medications   Medication Sig Start Date End Date Taking? Authorizing Provider  albuterol (PROVENTIL HFA) 108 (90 BASE) MCG/ACT inhaler Inhale 2 puffs into the lungs every 4 (four) hours as needed for shortness of breath. Wheezing    Yes Historical Provider, MD  albuterol (PROVENTIL) (2.5 MG/3ML) 0.083% nebulizer solution USE 1 VIAL WITH NEBULIZER EVERY 4 HOURS AS NEEDED FOR WHEEZING Patient taking differently: USE 1 VIAL WITH NEBULIZER NIGHTLY AT BEDTIME. 04/13/14  Yes Deneise Lever, MD  chlorthalidone (HYGROTON) 25 MG tablet Take 25 mg by mouth daily.    Yes Historical Provider, MD  fluticasone (FLONASE) 50 MCG/ACT nasal spray Place 2 sprays into both nostrils daily. Allergies 08/01/14  Yes Olga Millers, MD  HYDROcodone-acetaminophen (Willow) 7.5-325 MG per tablet Take 1-2 tablets by mouth every 4  (four) hours as needed for moderate pain or severe pain.  08/24/11  Yes Rometta Emery, PA-C  isosorbide mononitrate (IMDUR) 30 MG 24 hr tablet Take 1 tablet (30 mg total) by mouth every evening. 08/01/14  Yes Olga Millers, MD  loperamide (IMODIUM A-D) 2 MG tablet Take 2 mg by mouth 4 (four) times daily as needed for diarrhea or loose stools.   Yes Historical Provider, MD  nitroGLYCERIN (NITROSTAT) 0.4 MG SL tablet Place 0.4 mg under the tongue every 5 (five) minutes as needed. Chest pain    Yes Historical Provider, MD  Polyethyl Glycol-Propyl Glycol (SYSTANE) 0.4-0.3 % SOLN Apply 2 drops to eye 4 (four) times daily.    Yes Historical Provider, MD  pravastatin (PRAVACHOL) 20 MG tablet Take 3 tablets (60 mg total) by mouth daily. 08/01/14  Yes Olga Millers, MD  ranitidine (ZANTAC) 150 MG tablet Take 1 tablet (150 mg total) by mouth 2 (two) times daily. Patient taking differently: Take 300 mg by mouth  2 (two) times daily.  08/01/14  Yes Olga Millers, MD  senna (SENOKOT) 8.6 MG tablet Take 1 tablet (8.6 mg total) by mouth daily. Alternates with one tablet one day then two tablets the next day 08/01/14  Yes Olga Millers, MD  valsartan (DIOVAN) 160 MG tablet Take 1 tablet (160 mg total) by mouth daily. 08/01/14  Yes Olga Millers, MD  aspirin EC 81 MG tablet Take 1 tablet (81 mg total) by mouth daily. Patient not taking: Reported on 11/21/2014 01/23/14   Rowe Clack, MD  clarithromycin (BIAXIN) 500 MG tablet Take 1 tablet (500 mg total) by mouth 2 (two) times daily. Patient not taking: Reported on 11/21/2014 09/29/14   Lyndal Pulley, DO    Allergies:   Allergies  Allergen Reactions  . Actos [Pioglitazone Hydrochloride] Other (See Comments)    "felt funny, drowsy, and weak":  . Celebrex [Celecoxib] Other (See Comments)    "felt funny"  . Demerol Palpitations and Other (See Comments)    Increased BP  . Morphine And Related Nausea And Vomiting  . Ciprofloxacin Other  (See Comments)    arthralgia  . Metformin Nausea And Vomiting  . Zocor [Simvastatin] Other (See Comments)    Makes pt very drowsy    Social History:  Ambulatory   independently   Lives at home alone,      reports that he quit smoking about 19 years ago. He has never used smokeless tobacco. He reports that he does not drink alcohol or use illicit drugs.    Family History: family history includes Cancer in his mother; Heart attack in his brother and other family members; Hypertension in his mother. There is no history of Colon cancer or Stroke.    Physical Exam: Patient Vitals for the past 24 hrs:  BP Temp Temp src Pulse Resp SpO2  11/21/14 2024 132/60 mmHg - - 66 - 94 %  11/21/14 1930 (!) 135/52 mmHg - - 78 - 91 %  11/21/14 1925 132/60 mmHg - - 81 18 94 %  11/21/14 1648 125/62 mmHg 98.2 F (36.8 C) Oral 84 18 99 %    1. General:  in No Acute distress 2. Psychological: Alert and  Oriented 3. Head/ENT:    Dry Mucous Membranes                          Head Non traumatic, neck supple                          Normal  Dentition 4. SKIN:   decreased Skin turgor,  Skin clean Dry and intact no rash 5. Heart: Regular rate and rhythm no Murmur, Rub or gallop 6. Lungs: Clear to auscultation bilaterally, no wheezes or crackles   7. Abdomen:  Left lower tenderness and rebound,  distended 8. Lower extremities: no clubbing, cyanosis, or edema 9. Neurologically Grossly intact, moving all 4 extremities equally 10. MSK: Normal range of motion  body mass index is unknown because there is no weight on file.   Labs on Admission:   Results for orders placed or performed during the hospital encounter of 11/21/14 (from the past 24 hour(s))  Comprehensive metabolic panel     Status: Abnormal   Collection Time: 11/21/14  5:00 PM  Result Value Ref Range   Sodium 130 (L) 135 - 145 mmol/L   Potassium 4.6 3.5 - 5.1 mmol/L   Chloride  98 (L) 101 - 111 mmol/L   CO2 23 22 - 32 mmol/L   Glucose,  Bld 157 (H) 65 - 99 mg/dL   BUN 23 (H) 6 - 20 mg/dL   Creatinine, Ser 0.95 0.61 - 1.24 mg/dL   Calcium 11.1 (H) 8.9 - 10.3 mg/dL   Total Protein 7.5 6.5 - 8.1 g/dL   Albumin 4.4 3.5 - 5.0 g/dL   AST 26 15 - 41 U/L   ALT 23 17 - 63 U/L   Alkaline Phosphatase 78 38 - 126 U/L   Total Bilirubin 1.2 0.3 - 1.2 mg/dL   GFR calc non Af Amer >60 >60 mL/min   GFR calc Af Amer >60 >60 mL/min   Anion gap 9 5 - 15  CBC with Differential     Status: Abnormal   Collection Time: 11/21/14  5:00 PM  Result Value Ref Range   WBC 20.1 (H) 4.0 - 10.5 K/uL   RBC 4.06 (L) 4.22 - 5.81 MIL/uL   Hemoglobin 13.0 13.0 - 17.0 g/dL   HCT 39.7 39.0 - 52.0 %   MCV 97.8 78.0 - 100.0 fL   MCH 32.0 26.0 - 34.0 pg   MCHC 32.7 30.0 - 36.0 g/dL   RDW 12.7 11.5 - 15.5 %   Platelets 223 150 - 400 K/uL   Neutrophils Relative % 91 (H) 43 - 77 %   Neutro Abs 18.2 (H) 1.7 - 7.7 K/uL   Lymphocytes Relative 4 (L) 12 - 46 %   Lymphs Abs 0.9 0.7 - 4.0 K/uL   Monocytes Relative 5 3 - 12 %   Monocytes Absolute 1.0 0.1 - 1.0 K/uL   Eosinophils Relative 0 0 - 5 %   Eosinophils Absolute 0.0 0.0 - 0.7 K/uL   Basophils Relative 0 0 - 1 %   Basophils Absolute 0.0 0.0 - 0.1 K/uL  Lipase, blood     Status: Abnormal   Collection Time: 11/21/14  5:00 PM  Result Value Ref Range   Lipase <10 (L) 22 - 51 U/L  Urinalysis, Routine w reflex microscopic (not at [facility])     Status: None   Collection Time: 11/21/14  6:43 PM  Result Value Ref Range   Color, Urine YELLOW YELLOW   APPearance CLEAR CLEAR   Specific Gravity, Urine 1.014 1.005 - 1.030   pH 6.5 5.0 - 8.0   Glucose, UA NEGATIVE NEGATIVE mg/dL   Hgb urine dipstick NEGATIVE NEGATIVE   Bilirubin Urine NEGATIVE NEGATIVE   Ketones, ur NEGATIVE NEGATIVE mg/dL   Protein, ur NEGATIVE NEGATIVE mg/dL   Urobilinogen, UA 1.0 0.0 - 1.0 mg/dL   Nitrite NEGATIVE NEGATIVE   Leukocytes, UA NEGATIVE NEGATIVE  I-Stat CG4 Lactic Acid, ED     Status: None   Collection Time: 11/21/14  7:03 PM    Result Value Ref Range   Lactic Acid, Venous 1.26 0.5 - 2.0 mmol/L    UA no evidence of UTI  Lab Results  Component Value Date   HGBA1C 6.9* 04/11/2014    CrCl cannot be calculated (Unknown ideal weight.).  BNP (last 3 results)  Recent Labs  02/23/14 1232  PROBNP 35.0    Other results:  I have pearsonaly reviewed this: ECG REPORt not obtained   There were no vitals filed for this visit.   Cultures:    Component Value Date/Time   SDES BLOOD RIGHT HAND 03/15/2008 1952   SPECREQUEST BOTTLES DRAWN AEROBIC AND ANAEROBIC 10CC 03/15/2008 1952   CULT NO GROWTH  5 DAYS 03/15/2008 1952   REPTSTATUS 03/22/2008 FINAL 03/15/2008 1952     Radiological Exams on Admission: Ct Abdomen Pelvis W Contrast  11/21/2014   CLINICAL DATA:  81 year old male with 2-3 day history of lower abdominal pain, distention and constipation. Past history of diverticulitis and prostate cancer.  EXAM: CT ABDOMEN AND PELVIS WITH CONTRAST  TECHNIQUE: Multidetector CT imaging of the abdomen and pelvis was performed using the standard protocol following bolus administration of intravenous contrast.  CONTRAST:  1107mL OMNIPAQUE IOHEXOL 300 MG/ML  SOLN  COMPARISON:  Prior CT abdomen/ pelvis 03/13/2014  FINDINGS: Lower Chest: Dependent atelectasis in the lower lobes. The lungs are otherwise clear. Visualized cardiac structures within normal limits for size. No pericardial effusion. Sequelae of prior CABG and pacemaker placement. Unremarkable visualized distal thoracic esophagus.  Abdomen: Unremarkable CT appearance of the stomach, duodenum, spleen, adrenal glands. Diffuse fatty atrophy of the pancreas. No mass or inflammation. Multiple circumscribed low-attenuation lesions scattered throughout the liver the largest of which measures 4.4 cm and is water attenuation consistent with a simple cyst. Many of the other lesions are too small for accurate characterization but remain unchanged in size, number and configuration.  These almost certainly represent additional small cysts or biliary hamartomas. It is stable to slightly decreased size of a soft tissue nodule posterior to the right liver along the peritoneal surface which measures 17 x 8 mm compared to 19 x 9 mm previously. While nonspecific in appearance, this abnormality is remains essentially unchanged dating back to 2011. Greater than 5 year stability is consistent with benignity. Gallbladder is unremarkable. No intra or extrahepatic biliary ductal dilatation.  Unremarkable appearance of the bilateral kidneys. No focal solid lesion, hydronephrosis or nephrolithiasis. 2.9 cm simple cyst in the left lower pole. Additional tiny low-attenuation lesions are too small for characterization but statistically likely also benign cysts.  Extensive sigmoid colonic diverticula. There is focal inflammation of the proximal sigmoid colon consistent with acute diverticulitis. A large air collection is present within the anterior colonic wall measuring 8.3 x 4.4 cm. This likely represents a contained micro perforation. No fluid component to definitively suggest abscess.  Just inferior to the inflamed portion of colon lies the reservoir for the patient's penile prosthesis. This prosthesis may be extraperitoneal. No evidence of small bowel obstruction. Normal appendix in the right lower quadrant. No free fluid or suspicious adenopathy.  Pelvis: Left lower quadrant penile prosthesis. Multiple brachytherapy seeds project over the bladder. No suspicious adenopathy or free fluid.  Bones/Soft Tissues: No acute fracture or aggressive appearing lytic or blastic osseous lesion. Right hip arthroplasty prosthesis. Lower lumbar degenerative disc disease.  Vascular: Atherosclerotic vascular disease without significant stenosis or aneurysmal dilatation.  IMPRESSION: 1. Sigmoid colonic diverticulitis with large intramural air collection within the anterior wall of the affected sigmoid segment concerning for  contained micro perforation. No fluid component to suggest a drainable abscess. 2. Additional ancillary findings as above without significant interval change.   Electronically Signed   By: Jacqulynn Cadet M.D.   On: 11/21/2014 21:00    Chart has been reviewed  Family not  at  Bedside    Assessment/Plan 81 yo with history of hypertension coronary disease and diabetes presents with abdominal pain was found to have diverticulitis with microperforation resulting in air pocket 8 x 4 cm, general surgery is aware possibly to IR in AM for air aspiration  Present on Admission:  . Diverticulitis of colon without hemorrhage - admit for IV antibiotics, make patient nothing by  mouth. Secondary to significant air pocket formation have discussed with general surgery. He'll see patient in consult likely to have IR guided aspiration done tomorrow.  . Obstructive sleep apnea - no CPAP given microperforation and a.m. in the abdomen  . Essential hypertension - hold BP meds for now while strictly nothing by mouth. Reassess tomorrow and restart Imdur tomorrow night if patient's blood pressure stable  . Hyponatremia- we'll obtain urine electrolytes give gentle fluids patient reports recent bout of diarrhea   leukocytosis - patient has been recently given injections to his. Which could have had some systemic absorption. As well as evidence of diverticulitis with microperforations likely contributing to leukocytosis. Continue IV antibiotics Prophylaxis: SCD   CODE STATUS:  FULL CODE  as per patient    Disposition:  To home once workup is complete and patient is stable  Other plan as per orders.  I have spent a total of 55 min on this admission  Kenyen Candy 11/21/2014, 10:04 PM  [facility]  Pager [phone]   after 2 AM please page floor coverage PA If 7AM-7PM, please contact the day team taking care of the patient  [email or web address]  Password [email or web address]

## 2014-11-21 NOTE — ED Notes (Signed)
Pt reports hx of diverticulitis. Reports diarrhea and abd pain since Sunday. Reports abdominal distension denies n/v. Denies dysuria.

## 2014-11-21 NOTE — ED Notes (Signed)
Alert, NAD, calm, interactive, resps e/u, no dyspnea, updated, IVF and abx infusing, pt returned to monitor.

## 2014-11-21 NOTE — ED Notes (Signed)
Dr. Roel Cluck (hospitalist) and Dr.Toth (surgery) at [facility].

## 2014-11-21 NOTE — ED Notes (Signed)
I have called patient twice no show

## 2014-11-21 NOTE — ED Notes (Signed)
Patient is refusing IV access. Education provided on need for site. Pt has declined, will f/u

## 2014-11-21 NOTE — ED Provider Notes (Signed)
CSN: [id number]     Arrival date & time 11/21/14  1602 History   First MD Initiated Contact with Patient 11/21/14 1717     Chief Complaint  Patient presents with  . Abdominal Pain     (Consider location/radiation/quality/duration/timing/severity/associated sxs/prior Treatment) HPI Eric [name] is a 81 y.o. male with history of anemia, diabetes, coronary artery disease, hypertension, diverticulitis, COPD, GERD, presents to emergency department complaining of abdominal pain. Patient states his pain is in the left lower quadrant, started 3 days ago. He reports diarrhea for the last 2 days. Denies nausea or vomiting. No urinary symptoms. Did not take anything for his pain. States that pt received 4 steroid shots in his back today at [facility]. States abdomen is tender to the touch, nothing else is making him better or worse.   Past Medical History  Diagnosis Date  . LACTOSE INTOLERANCE   . OBESITY   . CARDIOMYOPATHY, ISCHEMIC   . AORTIC SCLEROSIS   . SICK SINUS/ TACHY-BRADY SYNDROME 09/2007    s/p PPM st judes  . PERIPHERAL VASCULAR DISEASE   . CAROTID BRUIT, RIGHT 02/27/2008  . IBS (irritable bowel syndrome)   . ALLERGIC RHINITIS   . ANEMIA-NOS   . OA (osteoarthritis)   . COPD   . GERD   . HYPERLIPIDEMIA   . HIATAL HERNIA   . Diverticulosis   . Prostate cancer     seed implants 2004  . DIABETES MELLITUS-TYPE II     diet controlled  . CORONARY ARTERY DISEASE     CABG 1995, PTCA/DES 2008, 2009 and 08/2010  . HYPERTENSION   . Asthma   . Sleep apnea   . Partial small bowel obstruction   . Primary hyperparathyroidism     Lab Results Component Value Date  PTH 150.7* 02/13/2013  CALCIUM 11.0* 02/13/2013  CAION 1.21 03/15/2008    . SMALL BOWEL OBSTRUCTION 04/18/2009    Qualifier: History of  By: Asa Lente MD, Jannifer Rodney Cataract     surgery  . Symptomatic diverticulosis 01/18/2009    Qualifier: Diagnosis of  By: Trellis Paganini PA-c, Amy S   . Hx of echocardiogram     Echo  (9/15):  Mild LVH, EF 50-55%, no RWMA, Gr 1 DD, MAC, mild LAE.   Past Surgical History  Procedure Laterality Date  . Ptca  2008, 2009, 2012    with DES  . Partial small bowel obstruction  2009  . Bilateral cataracts    . Pacemaker insertion      DDD/[employer]         Last interrogation 2/13  on chart     Pacemaker guideline order Dr Tamala Julian on chart  . Inguinal hernia repair Bilateral   . Lumbar disc surgery  12/2008  . Coronary artery bypass graft    . Total hip arthroplasty  08/21/2011    Procedure: TOTAL HIP ARTHROPLASTY;  Surgeon: Johnn Hai, MD;  Location: [facility];  Service: Orthopedics;  Laterality: Right;  . Colonoscopy    . Esophagogastroduodenoscopy  multiple  . Penile prosthesis placement    . Back surgery    . Colon surgery    . Flexible sigmoidoscopy N/A 09/22/2013    Procedure: FLEXIBLE SIGMOIDOSCOPY;  Surgeon: Gatha Mayer, MD;  Location: [facility];  Service: Endoscopy;  Laterality: N/A;   Family History  Problem Relation Age of Onset  . Colon cancer Neg Hx   . Hypertension Mother   . Heart attack    .  Heart attack    . Stroke Neg Hx   . Heart attack Brother   . Cancer Mother    History  Substance Use Topics  . Smoking status: Former Smoker    Quit date: 06/09/1995  . Smokeless tobacco: Never Used  . Alcohol Use: No    Review of Systems  Constitutional: Negative for fever and chills.  Respiratory: Negative for cough, chest tightness and shortness of breath.   Cardiovascular: Negative for chest pain, palpitations and leg swelling.  Gastrointestinal: Positive for abdominal pain and diarrhea. Negative for nausea, vomiting and abdominal distention.  Genitourinary: Negative for dysuria, urgency, frequency and hematuria.  Musculoskeletal: Negative for myalgias, arthralgias, neck pain and neck stiffness.  Skin: Negative for rash.  Allergic/Immunologic: Negative for immunocompromised state.  Neurological: Negative for dizziness, weakness,  light-headedness, numbness and headaches.  All other systems reviewed and are negative.     Allergies  Actos; Celebrex; Demerol; Morphine and related; Ciprofloxacin; Metformin; and Zocor  Home Medications   Prior to Admission medications   Medication Sig Start Date End Date Taking? Authorizing Provider  albuterol (PROVENTIL HFA) 108 (90 BASE) MCG/ACT inhaler Inhale 2 puffs into the lungs every 4 (four) hours as needed for shortness of breath. Wheezing    Yes Historical Provider, MD  albuterol (PROVENTIL) (2.5 MG/3ML) 0.083% nebulizer solution USE 1 VIAL WITH NEBULIZER EVERY 4 HOURS AS NEEDED FOR WHEEZING Patient taking differently: USE 1 VIAL WITH NEBULIZER NIGHTLY AT BEDTIME. 04/13/14  Yes Deneise Lever, MD  chlorthalidone (HYGROTON) 25 MG tablet Take 25 mg by mouth daily.    Yes Historical Provider, MD  fluticasone (FLONASE) 50 MCG/ACT nasal spray Place 2 sprays into both nostrils daily. Allergies 08/01/14  Yes Olga Millers, MD  HYDROcodone-acetaminophen ([place]) 7.5-325 MG per tablet Take 1-2 tablets by mouth every 4 (four) hours as needed for moderate pain or severe pain.  08/24/11  Yes Rometta Emery, PA-C  isosorbide mononitrate (IMDUR) 30 MG 24 hr tablet Take 1 tablet (30 mg total) by mouth every evening. 08/01/14  Yes Olga Millers, MD  loperamide (IMODIUM A-D) 2 MG tablet Take 2 mg by mouth 4 (four) times daily as needed for diarrhea or loose stools.   Yes Historical Provider, MD  nitroGLYCERIN (NITROSTAT) 0.4 MG SL tablet Place 0.4 mg under the tongue every 5 (five) minutes as needed. Chest pain    Yes Historical Provider, MD  Polyethyl Glycol-Propyl Glycol (SYSTANE) 0.4-0.3 % SOLN Apply 2 drops to eye 4 (four) times daily.    Yes Historical Provider, MD  pravastatin (PRAVACHOL) 20 MG tablet Take 3 tablets (60 mg total) by mouth daily. 08/01/14  Yes Olga Millers, MD  ranitidine (ZANTAC) 150 MG tablet Take 1 tablet (150 mg total) by mouth 2 (two) times  daily. Patient taking differently: Take 300 mg by mouth 2 (two) times daily.  08/01/14  Yes Olga Millers, MD  senna (SENOKOT) 8.6 MG tablet Take 1 tablet (8.6 mg total) by mouth daily. Alternates with one tablet one day then two tablets the next day 08/01/14  Yes Olga Millers, MD  valsartan (DIOVAN) 160 MG tablet Take 1 tablet (160 mg total) by mouth daily. 08/01/14  Yes Olga Millers, MD  aspirin EC 81 MG tablet Take 1 tablet (81 mg total) by mouth daily. Patient not taking: Reported on 11/21/2014 01/23/14   Rowe Clack, MD  clarithromycin (BIAXIN) 500 MG tablet Take 1 tablet (500 mg total) by mouth 2 (two) times daily.  Patient not taking: Reported on 11/21/2014 09/29/14   Lyndal Pulley, DO   BP 125/62 mmHg  Pulse 84  Temp(Src) 98.2 F (36.8 C) (Oral)  Resp 18  SpO2 99% Physical Exam  Constitutional: He appears well-developed and well-nourished. No distress.  HENT:  Head: Normocephalic and atraumatic.  Eyes: Conjunctivae are normal.  Neck: Neck supple.  Cardiovascular: Normal rate, regular rhythm and normal heart sounds.   Pulmonary/Chest: Effort normal. No respiratory distress. He has no wheezes. He has no rales.  Abdominal: Soft. Bowel sounds are normal. He exhibits no distension. There is tenderness. There is no rebound.  LLQ tenderness  Musculoskeletal: He exhibits no edema.  Neurological: He is alert.  Skin: Skin is warm and dry.  Nursing note and vitals reviewed.   ED Course  Procedures (including critical care time) Labs Review Labs Reviewed  COMPREHENSIVE METABOLIC PANEL - Abnormal; Notable for the following:    Sodium 130 (*)    Chloride 98 (*)    Glucose, Bld 157 (*)    BUN 23 (*)    Calcium 11.1 (*)    All other components within normal limits  CBC WITH DIFFERENTIAL/PLATELET - Abnormal; Notable for the following:    WBC 20.1 (*)    RBC 4.06 (*)    Neutrophils Relative % 91 (*)    Neutro Abs 18.2 (*)    Lymphocytes Relative 4 (*)    All  other components within normal limits  LIPASE, BLOOD  URINALYSIS, ROUTINE W REFLEX MICROSCOPIC (NOT AT [facility])  I-STAT CG4 LACTIC ACID, ED    Imaging Review Ct Abdomen Pelvis W Contrast  11/21/2014   CLINICAL DATA:  81 year old male with 2-3 day history of lower abdominal pain, distention and constipation. Past history of diverticulitis and prostate cancer.  EXAM: CT ABDOMEN AND PELVIS WITH CONTRAST  TECHNIQUE: Multidetector CT imaging of the abdomen and pelvis was performed using the standard protocol following bolus administration of intravenous contrast.  CONTRAST:  176mL OMNIPAQUE IOHEXOL 300 MG/ML  SOLN  COMPARISON:  Prior CT abdomen/ pelvis 03/13/2014  FINDINGS: Lower Chest: Dependent atelectasis in the lower lobes. The lungs are otherwise clear. Visualized cardiac structures within normal limits for size. No pericardial effusion. Sequelae of prior CABG and pacemaker placement. Unremarkable visualized distal thoracic esophagus.  Abdomen: Unremarkable CT appearance of the stomach, duodenum, spleen, adrenal glands. Diffuse fatty atrophy of the pancreas. No mass or inflammation. Multiple circumscribed low-attenuation lesions scattered throughout the liver the largest of which measures 4.4 cm and is water attenuation consistent with a simple cyst. Many of the other lesions are too small for accurate characterization but remain unchanged in size, number and configuration. These almost certainly represent additional small cysts or biliary hamartomas. It is stable to slightly decreased size of a soft tissue nodule posterior to the right liver along the peritoneal surface which measures 17 x 8 mm compared to 19 x 9 mm previously. While nonspecific in appearance, this abnormality is remains essentially unchanged dating back to 2011. Greater than 5 year stability is consistent with benignity. Gallbladder is unremarkable. No intra or extrahepatic biliary ductal dilatation.  Unremarkable appearance of the bilateral  kidneys. No focal solid lesion, hydronephrosis or nephrolithiasis. 2.9 cm simple cyst in the left lower pole. Additional tiny low-attenuation lesions are too small for characterization but statistically likely also benign cysts.  Extensive sigmoid colonic diverticula. There is focal inflammation of the proximal sigmoid colon consistent with acute diverticulitis. A large air collection is present within the anterior colonic  wall measuring 8.3 x 4.4 cm. This likely represents a contained micro perforation. No fluid component to definitively suggest abscess.  Just inferior to the inflamed portion of colon lies the reservoir for the patient's penile prosthesis. This prosthesis may be extraperitoneal. No evidence of small bowel obstruction. Normal appendix in the right lower quadrant. No free fluid or suspicious adenopathy.  Pelvis: Left lower quadrant penile prosthesis. Multiple brachytherapy seeds project over the bladder. No suspicious adenopathy or free fluid.  Bones/Soft Tissues: No acute fracture or aggressive appearing lytic or blastic osseous lesion. Right hip arthroplasty prosthesis. Lower lumbar degenerative disc disease.  Vascular: Atherosclerotic vascular disease without significant stenosis or aneurysmal dilatation.  IMPRESSION: 1. Sigmoid colonic diverticulitis with large intramural air collection within the anterior wall of the affected sigmoid segment concerning for contained micro perforation. No fluid component to suggest a drainable abscess. 2. Additional ancillary findings as above without significant interval change.   Electronically Signed   By: Jacqulynn Cadet M.D.   On: 11/21/2014 21:00     EKG Interpretation None      MDM   Final diagnoses:  Diverticulitis of intestine with perforation without bleeding    Pt with lower abdominal pain, no radiation. Pt non toxic appearing. Ambulatory with no problems, no guarding on exam. Given his age and elevation in [facility] will get ct abd and  pelvis.    Ct showing diverticulitis with possible microperf. Pt remains hemodynamically stable with normal hr and blood pressure. His pain is well controlled with no medications. Spoke with [facility], they will admit. Asked for general surgery consult  Spoke with Dr. Marlou Starks, will consult.    Filed Vitals:   11/22/14 1030 11/22/14 1402 11/22/14 1840 11/22/14 2009  BP: 130/49 107/79 141/55 131/50  Pulse: 64 64 64 64  Temp:  97.2 F (36.2 C) 97.6 F (36.4 C) 97.6 F (36.4 C)  TempSrc:  Oral Oral Oral  Resp: 20 20 18 18   Height:      Weight:      SpO2: 92% 96% 99% 93%      Jeannett Senior, PA-C 11/22/14 2026  Orlie Dakin, MD 11/23/14 [id number]

## 2014-11-21 NOTE — ED Notes (Signed)
Pt in CT.

## 2014-11-21 NOTE — Telephone Encounter (Signed)
Patient reports yesterday he had multiple episodes of diarrhea and LLQ pain.  He was in "so much pain and in my back I took a hydrocodone and to stop the diarrhea a imodium".  He reports that he has had no further stools at all.  He reports that his abdomen is "blown up and tight".  States that I can't button my pants.  He denies fever.  States that his abdomen in painful, more on the left than the right and very tender to the tough.  He denies fever.  I advised him after discussing with Nicoletta Ba PA to go to the ER for evaluation.  He very reluctantly agreed to go, but wanted [place] to know he was not happy about it.  I explained that I do not have any appts this pm and that he needs urgent evaluation and possible xrays.  He said he will go.

## 2014-11-21 NOTE — Consult Note (Signed)
Reason for Consult:abdominal pain Referring Physician: Dr. Lurlean Nanny is an 81 y.o. male.  HPI: The patient is a 81yo wm who presents with abdominal pain that started yesterday. He denies any fever or chills. He states he has been having a lot of stools lately. He came to ER where CT shows some diverticulitis with what appears to be a contained perforation with a collection of air next to colon.  Past Medical History  Diagnosis Date  . LACTOSE INTOLERANCE   . OBESITY   . CARDIOMYOPATHY, ISCHEMIC   . AORTIC SCLEROSIS   . SICK SINUS/ TACHY-BRADY SYNDROME 09/2007    s/p PPM st judes  . PERIPHERAL VASCULAR DISEASE   . CAROTID BRUIT, RIGHT 02/27/2008  . IBS (irritable bowel syndrome)   . ALLERGIC RHINITIS   . ANEMIA-NOS   . OA (osteoarthritis)   . COPD   . GERD   . HYPERLIPIDEMIA   . HIATAL HERNIA   . Diverticulosis   . Prostate cancer     seed implants 2004  . DIABETES MELLITUS-TYPE II     diet controlled  . CORONARY ARTERY DISEASE     CABG 1995, PTCA/DES 2008, 2009 and 08/2010  . HYPERTENSION   . Asthma   . Sleep apnea   . Partial small bowel obstruction   . Primary hyperparathyroidism     Lab Results Component Value Date  PTH 150.7* 02/13/2013  CALCIUM 11.0* 02/13/2013  CAION 1.21 03/15/2008    . SMALL BOWEL OBSTRUCTION 04/18/2009    Qualifier: History of  By: Asa Lente MD, Jannifer Rodney Cataract     surgery  . Symptomatic diverticulosis 01/18/2009    Qualifier: Diagnosis of  By: Trellis Paganini PA-c, Amy S   . Hx of echocardiogram     Echo (9/15):  Mild LVH, EF 50-55%, no RWMA, Gr 1 DD, MAC, mild LAE.    Past Surgical History  Procedure Laterality Date  . Ptca  2008, 2009, 2012    with DES  . Partial small bowel obstruction  2009  . Bilateral cataracts    . Pacemaker insertion      DDD/[employer]         Last interrogation 2/13  on chart     Pacemaker guideline order Dr Tamala Julian on chart  . Inguinal hernia repair Bilateral   . Lumbar disc surgery  12/2008  .  Coronary artery bypass graft    . Total hip arthroplasty  08/21/2011    Procedure: TOTAL HIP ARTHROPLASTY;  Surgeon: Johnn Hai, MD;  Location: [facility];  Service: Orthopedics;  Laterality: Right;  . Colonoscopy    . Esophagogastroduodenoscopy  multiple  . Penile prosthesis placement    . Back surgery    . Colon surgery    . Flexible sigmoidoscopy N/A 09/22/2013    Procedure: FLEXIBLE SIGMOIDOSCOPY;  Surgeon: Gatha Mayer, MD;  Location: [facility];  Service: Endoscopy;  Laterality: N/A;    Family History  Problem Relation Age of Onset  . Colon cancer Neg Hx   . Hypertension Mother   . Heart attack    . Heart attack    . Stroke Neg Hx   . Heart attack Brother   . Cancer Mother     Social History:  reports that he quit smoking about 19 years ago. He has never used smokeless tobacco. He reports that he does not drink alcohol or use illicit drugs.  Allergies:  Allergies  Allergen Reactions  .  Actos [Pioglitazone Hydrochloride] Other (See Comments)    "felt funny, drowsy, and weak":  . Celebrex [Celecoxib] Other (See Comments)    "felt funny"  . Demerol Palpitations and Other (See Comments)    Increased BP  . Morphine And Related Nausea And Vomiting  . Ciprofloxacin Other (See Comments)    arthralgia  . Metformin Nausea And Vomiting  . Zocor [Simvastatin] Other (See Comments)    Makes pt very drowsy    Medications: I have reviewed the patient's current medications.  Results for orders placed or performed during the hospital encounter of 11/21/14 (from the past 48 hour(s))  Comprehensive metabolic panel     Status: Abnormal   Collection Time: 11/21/14  5:00 PM  Result Value Ref Range   Sodium 130 (L) 135 - 145 mmol/L   Potassium 4.6 3.5 - 5.1 mmol/L   Chloride 98 (L) 101 - 111 mmol/L   CO2 23 22 - 32 mmol/L   Glucose, Bld 157 (H) 65 - 99 mg/dL   BUN 23 (H) 6 - 20 mg/dL   Creatinine, Ser 0.95 0.61 - 1.24 mg/dL   Calcium 11.1 (H) 8.9 - 10.3 mg/dL   Total Protein  7.5 6.5 - 8.1 g/dL   Albumin 4.4 3.5 - 5.0 g/dL   AST 26 15 - 41 U/L   ALT 23 17 - 63 U/L   Alkaline Phosphatase 78 38 - 126 U/L   Total Bilirubin 1.2 0.3 - 1.2 mg/dL   GFR calc non Af Amer >60 >60 mL/min   GFR calc Af Amer >60 >60 mL/min    Comment: (NOTE) The eGFR has been calculated using the CKD EPI equation. This calculation has not been validated in all clinical situations. eGFR's persistently <60 mL/min signify possible Chronic Kidney Disease.    Anion gap 9 5 - 15  CBC with Differential     Status: Abnormal   Collection Time: 11/21/14  5:00 PM  Result Value Ref Range   WBC 20.1 (H) 4.0 - 10.5 K/uL   RBC 4.06 (L) 4.22 - 5.81 MIL/uL   Hemoglobin 13.0 13.0 - 17.0 g/dL   HCT 39.7 39.0 - 52.0 %   MCV 97.8 78.0 - 100.0 fL   MCH 32.0 26.0 - 34.0 pg   MCHC 32.7 30.0 - 36.0 g/dL   RDW 12.7 11.5 - 15.5 %   Platelets 223 150 - 400 K/uL   Neutrophils Relative % 91 (H) 43 - 77 %   Neutro Abs 18.2 (H) 1.7 - 7.7 K/uL   Lymphocytes Relative 4 (L) 12 - 46 %   Lymphs Abs 0.9 0.7 - 4.0 K/uL   Monocytes Relative 5 3 - 12 %   Monocytes Absolute 1.0 0.1 - 1.0 K/uL   Eosinophils Relative 0 0 - 5 %   Eosinophils Absolute 0.0 0.0 - 0.7 K/uL   Basophils Relative 0 0 - 1 %   Basophils Absolute 0.0 0.0 - 0.1 K/uL  Lipase, blood     Status: Abnormal   Collection Time: 11/21/14  5:00 PM  Result Value Ref Range   Lipase <10 (L) 22 - 51 U/L  Urinalysis, Routine w reflex microscopic (not at [facility])     Status: None   Collection Time: 11/21/14  6:43 PM  Result Value Ref Range   Color, Urine YELLOW YELLOW   APPearance CLEAR CLEAR   Specific Gravity, Urine 1.014 1.005 - 1.030   pH 6.5 5.0 - 8.0   Glucose, UA NEGATIVE NEGATIVE mg/dL  Hgb urine dipstick NEGATIVE NEGATIVE   Bilirubin Urine NEGATIVE NEGATIVE   Ketones, ur NEGATIVE NEGATIVE mg/dL   Protein, ur NEGATIVE NEGATIVE mg/dL   Urobilinogen, UA 1.0 0.0 - 1.0 mg/dL   Nitrite NEGATIVE NEGATIVE   Leukocytes, UA NEGATIVE NEGATIVE     Comment: MICROSCOPIC NOT DONE ON URINES WITH NEGATIVE PROTEIN, BLOOD, LEUKOCYTES, NITRITE, OR GLUCOSE <1000 mg/dL.  I-Stat CG4 Lactic Acid, ED     Status: None   Collection Time: 11/21/14  7:03 PM  Result Value Ref Range   Lactic Acid, Venous 1.26 0.5 - 2.0 mmol/L    Ct Abdomen Pelvis W Contrast  11/21/2014   CLINICAL DATA:  80 year old male with 2-3 day history of lower abdominal pain, distention and constipation. Past history of diverticulitis and prostate cancer.  EXAM: CT ABDOMEN AND PELVIS WITH CONTRAST  TECHNIQUE: Multidetector CT imaging of the abdomen and pelvis was performed using the standard protocol following bolus administration of intravenous contrast.  CONTRAST:  170m OMNIPAQUE IOHEXOL 300 MG/ML  SOLN  COMPARISON:  Prior CT abdomen/ pelvis 03/13/2014  FINDINGS: Lower Chest: Dependent atelectasis in the lower lobes. The lungs are otherwise clear. Visualized cardiac structures within normal limits for size. No pericardial effusion. Sequelae of prior CABG and pacemaker placement. Unremarkable visualized distal thoracic esophagus.  Abdomen: Unremarkable CT appearance of the stomach, duodenum, spleen, adrenal glands. Diffuse fatty atrophy of the pancreas. No mass or inflammation. Multiple circumscribed low-attenuation lesions scattered throughout the liver the largest of which measures 4.4 cm and is water attenuation consistent with a simple cyst. Many of the other lesions are too small for accurate characterization but remain unchanged in size, number and configuration. These almost certainly represent additional small cysts or biliary hamartomas. It is stable to slightly decreased size of a soft tissue nodule posterior to the right liver along the peritoneal surface which measures 17 x 8 mm compared to 19 x 9 mm previously. While nonspecific in appearance, this abnormality is remains essentially unchanged dating back to 2011. Greater than 5 year stability is consistent with benignity.  Gallbladder is unremarkable. No intra or extrahepatic biliary ductal dilatation.  Unremarkable appearance of the bilateral kidneys. No focal solid lesion, hydronephrosis or nephrolithiasis. 2.9 cm simple cyst in the left lower pole. Additional tiny low-attenuation lesions are too small for characterization but statistically likely also benign cysts.  Extensive sigmoid colonic diverticula. There is focal inflammation of the proximal sigmoid colon consistent with acute diverticulitis. A large air collection is present within the anterior colonic wall measuring 8.3 x 4.4 cm. This likely represents a contained micro perforation. No fluid component to definitively suggest abscess.  Just inferior to the inflamed portion of colon lies the reservoir for the patient's penile prosthesis. This prosthesis may be extraperitoneal. No evidence of small bowel obstruction. Normal appendix in the right lower quadrant. No free fluid or suspicious adenopathy.  Pelvis: Left lower quadrant penile prosthesis. Multiple brachytherapy seeds project over the bladder. No suspicious adenopathy or free fluid.  Bones/Soft Tissues: No acute fracture or aggressive appearing lytic or blastic osseous lesion. Right hip arthroplasty prosthesis. Lower lumbar degenerative disc disease.  Vascular: Atherosclerotic vascular disease without significant stenosis or aneurysmal dilatation.  IMPRESSION: 1. Sigmoid colonic diverticulitis with large intramural air collection within the anterior wall of the affected sigmoid segment concerning for contained micro perforation. No fluid component to suggest a drainable abscess. 2. Additional ancillary findings as above without significant interval change.   Electronically Signed   By: HDellis FilbertD.  On: 11/21/2014 21:00    Review of Systems  Constitutional: Negative.   HENT: Negative.   Eyes: Negative.   Respiratory: Negative.   Cardiovascular: Negative.   Gastrointestinal: Positive for abdominal  pain.  Genitourinary: Negative.   Musculoskeletal: Negative.   Skin: Negative.   Neurological: Negative.   Endo/Heme/Allergies: Negative.   Psychiatric/Behavioral: Negative.    Blood pressure 137/47, pulse 75, temperature 98.2 F (36.8 C), temperature source Oral, resp. rate 15, SpO2 90 %. Physical Exam  Constitutional: He is oriented to person, place, and time. He appears well-developed and well-nourished.  HENT:  Head: Normocephalic and atraumatic.  Eyes: Conjunctivae and EOM are normal. Pupils are equal, round, and reactive to light.  Neck: Normal range of motion. Neck supple.  Cardiovascular: Normal rate, regular rhythm and normal heart sounds.   Respiratory: Effort normal and breath sounds normal.  GI: Soft. Bowel sounds are normal.  There is tenderness on the left side of the abdomen but the right side is very soft  Musculoskeletal: Normal range of motion.  Neurological: He is alert and oriented to person, place, and time.  Skin: Skin is warm and dry.  Psychiatric: He has a normal mood and affect. His behavior is normal.    Assessment/Plan: The patient appears to have sigmoid diverticulitis with contained perforation. He does not appear to have peritonitis or signs of sepsis but his wbc is elevated. I would recommend broad spectrum abx and bowel rest. Will ask IR to possibly drain the collection and will monitor closely. If he does not improve then he may require colectomy and colostomy. I have discussed this with him in detail and he agrees with treatment plan.  TOTH III,Jhamir Pickup S 11/21/2014, 11:07 PM

## 2014-11-22 DIAGNOSIS — K5732 Diverticulitis of large intestine without perforation or abscess without bleeding: Secondary | ICD-10-CM | POA: Diagnosis present

## 2014-11-22 DIAGNOSIS — K5720 Diverticulitis of large intestine with perforation and abscess without bleeding: Secondary | ICD-10-CM

## 2014-11-22 DIAGNOSIS — E871 Hypo-osmolality and hyponatremia: Secondary | ICD-10-CM | POA: Diagnosis present

## 2014-11-22 DIAGNOSIS — D72829 Elevated white blood cell count, unspecified: Secondary | ICD-10-CM | POA: Diagnosis present

## 2014-11-22 HISTORY — DX: Diverticulitis of large intestine with perforation and abscess without bleeding: K57.20

## 2014-11-22 HISTORY — DX: Hypo-osmolality and hyponatremia: E87.1

## 2014-11-22 LAB — GLUCOSE, CAPILLARY
GLUCOSE-CAPILLARY: 116 mg/dL — AB (ref 65–99)
GLUCOSE-CAPILLARY: 125 mg/dL — AB (ref 65–99)
GLUCOSE-CAPILLARY: 144 mg/dL — AB (ref 65–99)
GLUCOSE-CAPILLARY: 148 mg/dL — AB (ref 65–99)
GLUCOSE-CAPILLARY: 158 mg/dL — AB (ref 65–99)
Glucose-Capillary: 168 mg/dL — ABNORMAL HIGH (ref 65–99)
Glucose-Capillary: 131 mg/dL — ABNORMAL HIGH (ref 65–99)

## 2014-11-22 LAB — CBC
HCT: 36.8 % — ABNORMAL LOW (ref 39.0–52.0)
Hemoglobin: 12.2 g/dL — ABNORMAL LOW (ref 13.0–17.0)
MCH: 31.4 pg (ref 26.0–34.0)
MCHC: 33.2 g/dL (ref 30.0–36.0)
MCV: 94.8 fL (ref 78.0–100.0)
PLATELETS: 192 10*3/uL (ref 150–400)
RBC: 3.88 MIL/uL — ABNORMAL LOW (ref 4.22–5.81)
RDW: 12.4 % (ref 11.5–15.5)
WBC: 17.9 10*3/uL — ABNORMAL HIGH (ref 4.0–10.5)

## 2014-11-22 LAB — COMPREHENSIVE METABOLIC PANEL
ALBUMIN: 4 g/dL (ref 3.5–5.0)
ALT: 20 U/L (ref 17–63)
AST: 28 U/L (ref 15–41)
Alkaline Phosphatase: 66 U/L (ref 38–126)
Anion gap: 10 (ref 5–15)
BUN: 22 mg/dL — ABNORMAL HIGH (ref 6–20)
CALCIUM: 10.8 mg/dL — AB (ref 8.9–10.3)
CO2: 24 mmol/L (ref 22–32)
CREATININE: 0.92 mg/dL (ref 0.61–1.24)
Chloride: 99 mmol/L — ABNORMAL LOW (ref 101–111)
GFR calc Af Amer: >60 mL/min (ref >60)
GFR calc non Af Amer: >60 mL/min (ref >60)
Glucose, Bld: 149 mg/dL — ABNORMAL HIGH (ref 65–99)
Potassium: 4.8 mmol/L (ref 3.5–5.1)
Sodium: 133 mmol/L — ABNORMAL LOW (ref 135–145)
TOTAL PROTEIN: 7.3 g/dL (ref 6.5–8.1)
Total Bilirubin: 1.2 mg/dL (ref 0.3–1.2)

## 2014-11-22 LAB — PHOSPHORUS: PHOSPHORUS: 3.2 mg/dL (ref 2.5–4.6)

## 2014-11-22 LAB — SODIUM, URINE, RANDOM: Sodium, Ur: 65 mmol/L

## 2014-11-22 LAB — OSMOLALITY, URINE: Osmolality, Ur: 466 mOsm/kg (ref 390–1090)

## 2014-11-22 LAB — CREATININE, URINE, RANDOM: CREATININE, URINE: 78.33 mg/dL

## 2014-11-22 LAB — MAGNESIUM: MAGNESIUM: 1.9 mg/dL (ref 1.7–2.4)

## 2014-11-22 LAB — TSH: TSH: 0.672 u[IU]/mL (ref 0.350–4.500)

## 2014-11-22 MED ORDER — HYDRALAZINE HCL 20 MG/ML IJ SOLN
5.0000 mg | Freq: Four times a day (QID) | INTRAMUSCULAR | Status: DC | PRN
  Administered 2014-11-28 – 2014-11-29 (×2): 5 mg via INTRAVENOUS
  Filled 2014-11-22 (×2): qty 1

## 2014-11-22 MED ORDER — ACETAMINOPHEN 650 MG RE SUPP
650.0000 mg | RECTAL | Status: DC | PRN
  Administered 2014-11-27: 650 mg via RECTAL
  Filled 2014-11-22 (×2): qty 1

## 2014-11-22 MED ORDER — POLYVINYL ALCOHOL 1.4 % OP SOLN
1.0000 [drp] | OPHTHALMIC | Status: DC | PRN
  Administered 2014-11-22 – 2014-11-29 (×2): 1 [drp] via OPHTHALMIC
  Filled 2014-11-22 (×2): qty 15

## 2014-11-22 NOTE — Progress Notes (Signed)
Patient ID: Eric [name], male   DOB: November 15, 1926, 81 y.o.   MRN: [id number]    Subjective: Pt feels better today than yesterday, still with some pain, but less so.  No nausea, no BM since admission  Objective: Vital signs in last 24 hours: Temp:  [97.7 F (36.5 C)-98.3 F (36.8 C)] 97.7 F (36.5 C) (06/16 0522) Pulse Rate:  [63-84] 63 (06/16 0522) Resp:  [15-18] 18 (06/16 0522) BP: (125-154)/(46-91) 131/50 mmHg (06/16 0522) SpO2:  [90 %-99 %] 95 % (06/16 0522) Weight:  [97.8 kg (215 lb 9.8 oz)] 97.8 kg (215 lb 9.8 oz) (06/15 2334) Last BM Date: 11/20/14  Intake/Output from previous day: 06/15 0701 - 06/16 0700 In: 453.8 [I.V.:403.8; IV Piggyback:50] Out: 850 [Urine:850] Intake/Output this shift: Total I/O In: -  Out: 300 [Urine:300]  PE: Abd: soft, mild suprapubic and LLQ tenderness, +BS, ND Heart: regular Lungs: CTAB  Lab Results:   Recent Labs  11/21/14 1700 11/22/14 0450  WBC 20.1* 17.9*  HGB 13.0 12.2*  HCT 39.7 36.8*  PLT 223 192   BMET  Recent Labs  11/21/14 1700 11/22/14 0450  NA 130* 133*  K 4.6 4.8  CL 98* 99*  CO2 23 24  GLUCOSE 157* 149*  BUN 23* 22*  CREATININE 0.95 0.92  CALCIUM 11.1* 10.8*   PT/INR No results for input(s): LABPROT, INR in the last 72 hours. CMP     Component Value Date/Time   NA 133* 11/22/2014 0450   NA 135* 04/11/2014   K 4.8 11/22/2014 0450   CL 99* 11/22/2014 0450   CO2 24 11/22/2014 0450   GLUCOSE 149* 11/22/2014 0450   BUN 22* 11/22/2014 0450   BUN 11 04/11/2014   CREATININE 0.92 11/22/2014 0450   CREATININE 1.0 04/11/2014   CALCIUM 10.8* 11/22/2014 0450   PROT 7.3 11/22/2014 0450   ALBUMIN 4.0 11/22/2014 0450   AST 28 11/22/2014 0450   ALT 20 11/22/2014 0450   ALKPHOS 66 11/22/2014 0450   BILITOT 1.2 11/22/2014 0450   GFRNONAA >60 11/22/2014 0450   GFRAA >60 11/22/2014 0450   Lipase     Component Value Date/Time   LIPASE <10* 11/21/2014 1700       Studies/Results: Ct Abdomen Pelvis W  Contrast  11/21/2014   CLINICAL DATA:  81 year old male with 2-3 day history of lower abdominal pain, distention and constipation. Past history of diverticulitis and prostate cancer.  EXAM: CT ABDOMEN AND PELVIS WITH CONTRAST  TECHNIQUE: Multidetector CT imaging of the abdomen and pelvis was performed using the standard protocol following bolus administration of intravenous contrast.  CONTRAST:  125mL OMNIPAQUE IOHEXOL 300 MG/ML  SOLN  COMPARISON:  Prior CT abdomen/ pelvis 03/13/2014  FINDINGS: Lower Chest: Dependent atelectasis in the lower lobes. The lungs are otherwise clear. Visualized cardiac structures within normal limits for size. No pericardial effusion. Sequelae of prior CABG and pacemaker placement. Unremarkable visualized distal thoracic esophagus.  Abdomen: Unremarkable CT appearance of the stomach, duodenum, spleen, adrenal glands. Diffuse fatty atrophy of the pancreas. No mass or inflammation. Multiple circumscribed low-attenuation lesions scattered throughout the liver the largest of which measures 4.4 cm and is water attenuation consistent with a simple cyst. Many of the other lesions are too small for accurate characterization but remain unchanged in size, number and configuration. These almost certainly represent additional small cysts or biliary hamartomas. It is stable to slightly decreased size of a soft tissue nodule posterior to the right liver along the peritoneal surface which measures 17  x 8 mm compared to 19 x 9 mm previously. While nonspecific in appearance, this abnormality is remains essentially unchanged dating back to 2011. Greater than 5 year stability is consistent with benignity. Gallbladder is unremarkable. No intra or extrahepatic biliary ductal dilatation.  Unremarkable appearance of the bilateral kidneys. No focal solid lesion, hydronephrosis or nephrolithiasis. 2.9 cm simple cyst in the left lower pole. Additional tiny low-attenuation lesions are too small for  characterization but statistically likely also benign cysts.  Extensive sigmoid colonic diverticula. There is focal inflammation of the proximal sigmoid colon consistent with acute diverticulitis. A large air collection is present within the anterior colonic wall measuring 8.3 x 4.4 cm. This likely represents a contained micro perforation. No fluid component to definitively suggest abscess.  Just inferior to the inflamed portion of colon lies the reservoir for the patient's penile prosthesis. This prosthesis may be extraperitoneal. No evidence of small bowel obstruction. Normal appendix in the right lower quadrant. No free fluid or suspicious adenopathy.  Pelvis: Left lower quadrant penile prosthesis. Multiple brachytherapy seeds project over the bladder. No suspicious adenopathy or free fluid.  Bones/Soft Tissues: No acute fracture or aggressive appearing lytic or blastic osseous lesion. Right hip arthroplasty prosthesis. Lower lumbar degenerative disc disease.  Vascular: Atherosclerotic vascular disease without significant stenosis or aneurysmal dilatation.  IMPRESSION: 1. Sigmoid colonic diverticulitis with large intramural air collection within the anterior wall of the affected sigmoid segment concerning for contained micro perforation. No fluid component to suggest a drainable abscess. 2. Additional ancillary findings as above without significant interval change.   Electronically Signed   By: Jacqulynn Cadet M.D.   On: 11/21/2014 21:00    Anti-infectives: Anti-infectives    Start     Dose/Rate Route Frequency Ordered Stop   11/21/14 2330  piperacillin-tazobactam (ZOSYN) IVPB 3.375 g     3.375 g 12.5 mL/hr over 240 Minutes Intravenous 3 times per day 11/21/14 2325     11/21/14 2130  piperacillin-tazobactam (ZOSYN) IVPB 3.375 g     3.375 g 12.5 mL/hr over 240 Minutes Intravenous  Once 11/21/14 2127 11/22/14 0150   11/21/14 2115  metroNIDAZOLE (FLAGYL) IVPB 500 mg  Status:  Discontinued     500  mg 100 mL/hr over 60 Minutes Intravenous  Once 11/21/14 2109 11/21/14 2127       Assessment/Plan  1. Diverticulitis with microperforation  -patient feels better today with less pain and WBC down to 17 from 20.   -will give clear liquids -cont IV zosyn D2 -mobilize and pulm toilet   LOS: 1 day    OSBORNE,KELLY E 11/22/2014, 9:42 AM Pager: [phone]  Agree with above. Seems better. No happy that he has to ask for help to get out of bed.  Alphonsa Overall, MD, [facility] Pager: [phone] Office phone:  [phone]

## 2014-11-22 NOTE — Progress Notes (Signed)
Patient ID: Eric [name], male   DOB: 1926-12-06, 81 y.o.   MRN: [id number] [name] PROGRESS NOTE  [name] HUO:[id number] DOB: 03/26/1927 DOA: 11/21/2014 PCP: [name], MD  Brief narrative:    81 y.o. male with past medical history of COPD, hypertension, dyslipidemia, GERD, coronary artery disease who [facility] to [name] with reports of left lower quadrant abdominal pain for last week or so prior to this admission. He also reported increase in stool output. No fevers or chills. No reports of blood in the stool. Patient was hemodynamically stable in ED. His blood work was notable for leukocytosis of 20.1, sodium 130, normal creatinine and calcium elevated at 11.1. CT abdomen revealed sigmoid colon diverticulitis with area concerning for contained microperforation. Surgery has seen the patient in consultation and recommends interventional radiology evaluation for possible aspiration.  Barrier to discharge: Patient with diverticulitis and possible microperforation. We will see what interventional radiology recommends, whether aspiration is possible or not. There was no fluid component to suggest a drainable abscess on CT scan. Patient is on IV Zosyn which we will continue as recommended by surgery.   Assessment/Plan:    Principal Problem:   Diverticulitis of colon with perforation / leukocytosis / abdominal pain - Patient presented with abdominal pain in the left lower quadrant and increased stool output. - Patient was found to have diverticulitis with possible area of microperforation as seen on CT abdomen on this admission. - Continue IV Zosyn for now - Surgery has seen the patient in consultation. They recommend interventional radiology evaluation for possible aspiration. - Continue pain management efforts, patient is currently on Dilaudid 1 mg every 4 hours as needed for severe pain.  Active Problems:   Type 2 diabetes mellitus, controlled without  complications - B5M about 7 months ago was 6.9 indicating good glycemic control - We'll continue sliding scale insulin for now since patient is nothing by mouth - CBG's in past 12 hours: 157, 149    Dyslipidemia - Once patient resumes by mouth intake will resume statin.    Essential hypertension - Currently hydralazine IV as needed for blood pressure control if needed    Coronary atherosclerosis - Aspirin on hold until able to take PO     DVT Prophylaxis  - SCD's bilaterally    Code Status: Full.  Family Communication:  plan of care discussed with the patient Disposition Plan: Home once diverticulitis stable, once able to tolerate PO intake.   IV access:  Peripheral IV  Procedures and diagnostic studies:    Ct Abdomen Pelvis W Contrast 11/21/2014   1. Sigmoid colonic diverticulitis with large intramural air collection within the anterior wall of the affected sigmoid segment concerning for contained micro perforation. No fluid component to suggest a drainable abscess. 2. Additional ancillary findings as above without significant interval change.   Electronically Signed   By: Jacqulynn Cadet M.D.   On: 11/21/2014 21:00    Medical Consultants:  Surgery  Other Consultants:  None   IAnti-Infectives:   Zosyn 11/21/2014 -->   Leisa Lenz, MD  [name] Pager [phone]  Time spent in minutes: 25 minutes  If 7PM-7AM, please contact night-coverage [email or web address] Password [facility] 11/22/2014, 9:29 AM   LOS: 1 day    HPI/Subjective: No acute overnight events. Patient reports nausea and abdominal pain better.   Objective: Filed Vitals:   11/21/14 2245 11/21/14 2315 11/21/14 2334 11/22/14 0522  BP: 137/47 126/53 148/66 131/50  Pulse:  73 75 63  Temp:   98.3 F (36.8 C) 97.7 F (36.5 C)  TempSrc:   Oral Oral  Resp:   16 18  Height:   5\' 6"  (1.676 m)   Weight:   97.8 kg (215 lb 9.8 oz)   SpO2:  95% 93% 95%    Intake/Output Summary (Last 24 hours) at  11/22/14 0929 Last data filed at 11/22/14 0720  Gross per 24 hour  Intake 453.75 ml  Output   1150 ml  Net -696.25 ml    Exam:   General:  Pt is alert, follows commands appropriately, not in acute distress  Cardiovascular: Regular rate and rhythm, S1/S2 apprecaited   Respiratory: Clear to auscultation bilaterally, no wheezing, no crackles, no rhonchi  Abdomen: Soft, non tender, non distended, bowel sounds present  Extremities: No edema, pulses DP and PT palpable bilaterally  Neuro: Grossly nonfocal  Data Reviewed: Basic Metabolic Panel:  Recent Labs Lab 11/21/14 1700 11/22/14 0450  NA 130* 133*  K 4.6 4.8  CL 98* 99*  CO2 23 24  GLUCOSE 157* 149*  BUN 23* 22*  CREATININE 0.95 0.92  CALCIUM 11.1* 10.8*  MG  --  1.9  PHOS  --  3.2   Liver Function Tests:  Recent Labs Lab 11/21/14 1700 11/22/14 0450  AST 26 28  ALT 23 20  ALKPHOS 78 66  BILITOT 1.2 1.2  PROT 7.5 7.3  ALBUMIN 4.4 4.0    Recent Labs Lab 11/21/14 1700  LIPASE <10*   No results for input(s): AMMONIA in the last 168 hours. CBC:  Recent Labs Lab 11/21/14 1700 11/22/14 0450  WBC 20.1* 17.9*  NEUTROABS 18.2*  --   HGB 13.0 12.2*  HCT 39.7 36.8*  MCV 97.8 94.8  PLT 223 192   Cardiac Enzymes: No results for input(s): CKTOTAL, CKMB, CKMBINDEX, TROPONINI in the last 168 hours. BNP: Invalid input(s): POCBNP CBG:  Recent Labs Lab 11/21/14 2355 11/22/14 0506 11/22/14 0822  GLUCAP 148* 158* 144*    No results found for this or any previous visit (from the past 240 hour(s)).   Scheduled Meds: . insulin aspart  0-9 Units Subcutaneous 6 times per day  . isosorbide mononitrate  30 mg Oral QPM  . piperacillin-tazobactam (ZOSYN)  IV  3.375 g Intravenous 3 times per day  . sodium chloride  3 mL Intravenous Q12H   Continuous Infusions:

## 2014-11-22 NOTE — Care Management Note (Signed)
Case Management Note  Patient Details  Name: Eric [name] MRN: [id number] Date of Birth: 09-11-1926  Subjective/Objective: 81yo pt admitted with abd pain, diverticulitis                  Action/Plan:from home plan to return home   Expected Discharge Date:                  Expected Discharge Plan:     In-House Referral:     Discharge planning Services  CM Consult  Post Acute Care Choice:    Choice offered to:     DME Arranged:    DME Agency:     HH Arranged:    HH Agency:     Status of Service:  In process, will continue to follow  Medicare Important Message Given:    Date Medicare IM Given:    Medicare IM give by:    Date Additional Medicare IM Given:    Additional Medicare Important Message give by:     If discussed at [place] of Stay Meetings, dates discussed:    Additional CommentsPurcell Mouton, RN 11/22/2014, 3:30 PM

## 2014-11-23 LAB — CBC
HEMATOCRIT: 37.5 % — AB (ref 39.0–52.0)
Hemoglobin: 12.4 g/dL — ABNORMAL LOW (ref 13.0–17.0)
MCH: 31.9 pg (ref 26.0–34.0)
MCHC: 33.1 g/dL (ref 30.0–36.0)
MCV: 96.4 fL (ref 78.0–100.0)
Platelets: 233 10*3/uL (ref 150–400)
RBC: 3.89 MIL/uL — ABNORMAL LOW (ref 4.22–5.81)
RDW: 12.5 % (ref 11.5–15.5)
WBC: 18.6 10*3/uL — ABNORMAL HIGH (ref 4.0–10.5)

## 2014-11-23 LAB — GLUCOSE, CAPILLARY
GLUCOSE-CAPILLARY: 118 mg/dL — AB (ref 65–99)
GLUCOSE-CAPILLARY: 115 mg/dL — AB (ref 65–99)
GLUCOSE-CAPILLARY: 181 mg/dL — AB (ref 65–99)
Glucose-Capillary: 146 mg/dL — ABNORMAL HIGH (ref 65–99)
Glucose-Capillary: 123 mg/dL — ABNORMAL HIGH (ref 65–99)

## 2014-11-23 LAB — HEMOGLOBIN A1C
HEMOGLOBIN A1C: 6.5 % — AB (ref 4.8–5.6)
Mean Plasma Glucose: 140 mg/dL

## 2014-11-23 NOTE — Progress Notes (Signed)
Patient ID: Eric [name], male   DOB: July 18, 1926, 81 y.o.   MRN: [id number] [facility] PROGRESS NOTE  Eric [name] [id number]:[id number] DOB: 09-11-1926 DOA: 11/21/2014 PCP: [name], MD  Brief narrative:    81 y.o. male with past medical history of COPD, hypertension, dyslipidemia, GERD, coronary artery disease who presented to [facility] long hospital with reports of left lower quadrant abdominal pain for last week or so prior to this admission. He also reported increase in stool output. No fevers or chills. No reports of blood in the stool.  Patient was hemodynamically stable in ED. His blood work was notable for leukocytosis of 20.1, sodium 130, normal creatinine and calcium elevated at 11.1. CT abdomen revealed sigmoid colon diverticulitis with area concerning for contained microperforation. Surgery has seen the patient in consultation and recommends interventional radiology evaluation for possible aspiration.  Barrier to discharge: Patient with diverticulitis and possible microperforation. Surgery recommends continuing clear liquid diet and not advancing diet today. They also recommend continuing IV Zosyn. If he is able to tolerate regular diet the next day or so I anticipated discharge by 11/25/2014.   Assessment/Plan:    Principal Problem:   Diverticulitis of colon with perforation / leukocytosis / abdominal pain - Patient presented with reports of abdominal pain and he was found to have diverticulitis with possible area of microperforation seen on CT abdomen. - Patient was seen by surgery in consultation. They recommended continuing clear liquid diet for next 24 hours. - Surgery also recommends continuing IV Zosyn. - Continue pain management efforts. Pain is well controlled with Dilaudid 1 mg every 4 hours as needed for severe pain.  Active Problems:   Type 2 diabetes mellitus, controlled without complications - I0X about 7 months ago was 6.9 indicating good glycemic  control - Continue sliding scale insulin for now since patient is nothing by mouth - CBG's in past 12 hours: 157, 149    Dyslipidemia - Resume statin therapy once patient tolerates regular diet.    Essential hypertension - Currently hydralazine IV as needed for blood pressure control if needed - Current blood pressure is 133/98.    Coronary atherosclerosis - Aspirin on hold until patient tolerates regular diet.    DVT Prophylaxis  - SCD's bilaterally in hospital.   Code Status: Full.  Family Communication:  plan of care discussed with the patient Disposition Plan: Currently on clear liquid diet which is recommended for surgery. Home once tolerates regular diet.  IV access:  Peripheral IV  Procedures and diagnostic studies:    Ct Abdomen Pelvis W Contrast 11/21/2014   1. Sigmoid colonic diverticulitis with large intramural air collection within the anterior wall of the affected sigmoid segment concerning for contained micro perforation. No fluid component to suggest a drainable abscess. 2. Additional ancillary findings as above without significant interval change.   Electronically Signed   By: Jacqulynn Cadet M.D.   On: 11/21/2014 21:00    Medical Consultants:  Surgery  Other Consultants:  None   IAnti-Infectives:   Zosyn 11/21/2014 -->   Leisa Lenz, MD  [facility] Pager [phone]  Time spent in minutes: 25 minutes  If 7PM-7AM, please contact night-coverage [email or web address] Password [email or web address] 11/23/2014, 12:01 PM   LOS: 2 days    HPI/Subjective: No acute overnight events. Patient reports feeling much better and he is eager to go home.  Objective: Filed Vitals:   11/22/14 1840 11/22/14 2009 11/22/14 2347 11/23/14 0425  BP: 141/55 131/50 138/48 133/98  Pulse: 64 64 59  59  Temp: 97.6 F (36.4 C) 97.6 F (36.4 C) 97.8 F (36.6 C) 97.7 F (36.5 C)  TempSrc: Oral Oral Oral Axillary  Resp: 18 18 18 16   Height:      Weight:      SpO2: 99% 93% 100% 98%     Intake/Output Summary (Last 24 hours) at 11/23/14 1201 Last data filed at 11/23/14 1100  Gross per 24 hour  Intake   1300 ml  Output      1 ml  Net   1299 ml    Exam:   General:  Pt is alert, not in acute distress  Cardiovascular: Rate controlled, appreciate S1-S2  Respiratory: No wheezing, no crackles  Abdomen: Slight tenderness in mid abdomen, no distention, appreciate bowel sounds  Extremities: No leg swelling, pulses palpable  Neuro: No focal deficits  Data Reviewed: Basic Metabolic Panel:  Recent Labs Lab 11/21/14 1700 11/22/14 0450  NA 130* 133*  K 4.6 4.8  CL 98* 99*  CO2 23 24  GLUCOSE 157* 149*  BUN 23* 22*  CREATININE 0.95 0.92  CALCIUM 11.1* 10.8*  MG  --  1.9  PHOS  --  3.2   Liver Function Tests:  Recent Labs Lab 11/21/14 1700 11/22/14 0450  AST 26 28  ALT 23 20  ALKPHOS 78 66  BILITOT 1.2 1.2  PROT 7.5 7.3  ALBUMIN 4.4 4.0    Recent Labs Lab 11/21/14 1700  LIPASE <10*   No results for input(s): AMMONIA in the last 168 hours. CBC:  Recent Labs Lab 11/21/14 1700 11/22/14 0450 11/23/14 0002  WBC 20.1* 17.9* 18.6*  NEUTROABS 18.2*  --   --   HGB 13.0 12.2* 12.4*  HCT 39.7 36.8* 37.5*  MCV 97.8 94.8 96.4  PLT 223 192 233   Cardiac Enzymes: No results for input(s): CKTOTAL, CKMB, CKMBINDEX, TROPONINI in the last 168 hours. BNP: Invalid input(s): POCBNP CBG:  Recent Labs Lab 11/22/14 2008 11/22/14 2346 11/23/14 0421 11/23/14 0741 11/23/14 1149  GLUCAP 131* 125* 146* 118* 123*    No results found for this or any previous visit (from the past 240 hour(s)).   Scheduled Meds: . insulin aspart  0-9 Units Subcutaneous 6 times per day  . piperacillin-tazobactam (ZOSYN)  IV  3.375 g Intravenous 3 times per day  . sodium chloride  3 mL Intravenous Q12H   Continuous Infusions:

## 2014-11-23 NOTE — Plan of Care (Signed)
Problem: Phase II Progression Outcomes Goal: Discharge plan established Outcome: Progressing Pt instructed on d/c per MD with expected d/c date of 11/25/14 Goal: Vital signs remain stable Outcome: Progressing VSS this shift

## 2014-11-23 NOTE — Progress Notes (Signed)
Patient ID: Eric [name], male   DOB: [birth year], 81 y.o.   MRN: [id number]    Subjective: Pt has a little more pain today in the left mid quadrant.  Tolerating clears.  Objective: Vital signs in last 24 hours: Temp:  [97.2 F (36.2 C)-97.8 F (36.6 C)] 97.7 F (36.5 C) (06/17 0425) Pulse Rate:  [59-64] 59 (06/17 0425) Resp:  [16-20] 16 (06/17 0425) BP: (107-141)/(48-98) 133/98 mmHg (06/17 0425) SpO2:  [93 %-100 %] 98 % (06/17 0425) Last BM Date: 11/20/14  Intake/Output from previous day: 06/16 0701 - 06/17 0700 In: 1460 [P.O.:960; I.V.:400; IV Piggyback:100] Out: 301 [Urine:300; Stool:1] Intake/Output this shift:    PE: Abd: soft, a little more tender in LMQ today, but still a relatively benign exam, +BS, ND Heart: regular Lungs: CTAB  Lab Results:   Recent Labs  11/22/14 0450 11/23/14 0002  WBC 17.9* 18.6*  HGB 12.2* 12.4*  HCT 36.8* 37.5*  PLT 192 233   BMET  Recent Labs  11/21/14 1700 11/22/14 0450  NA 130* 133*  K 4.6 4.8  CL 98* 99*  CO2 23 24  GLUCOSE 157* 149*  BUN 23* 22*  CREATININE 0.95 0.92  CALCIUM 11.1* 10.8*   PT/INR No results for input(s): LABPROT, INR in the last 72 hours. CMP     Component Value Date/Time   NA 133* 11/22/2014 0450   NA 135* 04/11/2014   K 4.8 11/22/2014 0450   CL 99* 11/22/2014 0450   CO2 24 11/22/2014 0450   GLUCOSE 149* 11/22/2014 0450   BUN 22* 11/22/2014 0450   BUN 11 04/11/2014   CREATININE 0.92 11/22/2014 0450   CREATININE 1.0 04/11/2014   CALCIUM 10.8* 11/22/2014 0450   PROT 7.3 11/22/2014 0450   ALBUMIN 4.0 11/22/2014 0450   AST 28 11/22/2014 0450   ALT 20 11/22/2014 0450   ALKPHOS 66 11/22/2014 0450   BILITOT 1.2 11/22/2014 0450   GFRNONAA >60 11/22/2014 0450   GFRAA >60 11/22/2014 0450   Lipase     Component Value Date/Time   LIPASE <10* 11/21/2014 1700       Studies/Results: Ct Abdomen Pelvis W Contrast  11/21/2014   CLINICAL DATA:  81 year old male with 2-3 day history of  lower abdominal pain, distention and constipation. Past history of diverticulitis and prostate cancer.  EXAM: CT ABDOMEN AND PELVIS WITH CONTRAST  TECHNIQUE: Multidetector CT imaging of the abdomen and pelvis was performed using the standard protocol following bolus administration of intravenous contrast.  CONTRAST:  132mL OMNIPAQUE IOHEXOL 300 MG/ML  SOLN  COMPARISON:  Prior CT abdomen/ pelvis 03/13/2014  FINDINGS: Lower Chest: Dependent atelectasis in the lower lobes. The lungs are otherwise clear. Visualized cardiac structures within normal limits for size. No pericardial effusion. Sequelae of prior CABG and pacemaker placement. Unremarkable visualized distal thoracic esophagus.  Abdomen: Unremarkable CT appearance of the stomach, duodenum, spleen, adrenal glands. Diffuse fatty atrophy of the pancreas. No mass or inflammation. Multiple circumscribed low-attenuation lesions scattered throughout the liver the largest of which measures 4.4 cm and is water attenuation consistent with a simple cyst. Many of the other lesions are too small for accurate characterization but remain unchanged in size, number and configuration. These almost certainly represent additional small cysts or biliary hamartomas. It is stable to slightly decreased size of a soft tissue nodule posterior to the right liver along the peritoneal surface which measures 17 x 8 mm compared to 19 x 9 mm previously. While nonspecific in appearance, this abnormality is  remains essentially unchanged dating back to 2011. Greater than 5 year stability is consistent with benignity. Gallbladder is unremarkable. No intra or extrahepatic biliary ductal dilatation.  Unremarkable appearance of the bilateral kidneys. No focal solid lesion, hydronephrosis or nephrolithiasis. 2.9 cm simple cyst in the left lower pole. Additional tiny low-attenuation lesions are too small for characterization but statistically likely also benign cysts.  Extensive sigmoid colonic  diverticula. There is focal inflammation of the proximal sigmoid colon consistent with acute diverticulitis. A large air collection is present within the anterior colonic wall measuring 8.3 x 4.4 cm. This likely represents a contained micro perforation. No fluid component to definitively suggest abscess.  Just inferior to the inflamed portion of colon lies the reservoir for the patient's penile prosthesis. This prosthesis may be extraperitoneal. No evidence of small bowel obstruction. Normal appendix in the right lower quadrant. No free fluid or suspicious adenopathy.  Pelvis: Left lower quadrant penile prosthesis. Multiple brachytherapy seeds project over the bladder. No suspicious adenopathy or free fluid.  Bones/Soft Tissues: No acute fracture or aggressive appearing lytic or blastic osseous lesion. Right hip arthroplasty prosthesis. Lower lumbar degenerative disc disease.  Vascular: Atherosclerotic vascular disease without significant stenosis or aneurysmal dilatation.  IMPRESSION: 1. Sigmoid colonic diverticulitis with large intramural air collection within the anterior wall of the affected sigmoid segment concerning for contained micro perforation. No fluid component to suggest a drainable abscess. 2. Additional ancillary findings as above without significant interval change.   Electronically Signed   By: Jacqulynn Cadet M.D.   On: 11/21/2014 21:00    Anti-infectives: Anti-infectives    Start     Dose/Rate Route Frequency Ordered Stop   11/21/14 2330  piperacillin-tazobactam (ZOSYN) IVPB 3.375 g     3.375 g 12.5 mL/hr over 240 Minutes Intravenous 3 times per day 11/21/14 2325     11/21/14 2130  piperacillin-tazobactam (ZOSYN) IVPB 3.375 g     3.375 g 12.5 mL/hr over 240 Minutes Intravenous  Once 11/21/14 2127 11/22/14 0150   11/21/14 2115  metroNIDAZOLE (FLAGYL) IVPB 500 mg  Status:  Discontinued     500 mg 100 mL/hr over 60 Minutes Intravenous  Once 11/21/14 2109 11/21/14 2127        Assessment/Plan  1. Diverticulitis with microperforation -patient feels about the same, but some increase in pain, mostly in his left flank area.  He does have some back pain as well and had cortisone shots for this on Wednesday.  -follow WBC -cont on clears, would not advance -cont IV zosyn D3 -mobilize and pulm toilet   2.  Hard of hearing  3.  COPD 4   HTN   LOS: 2 days    OSBORNE,KELLY E 11/23/2014, 10:50 AM Pager: [phone]  Agree with above.  Tonight, he feels better than when Claiborne Billings saw him this AM.    He is single - lives in senior citizen duplex.  He has 4 daughters.  He said that they are sicker than him.  He is somewhat frustrated.  He is also hard of hearing, which makes discussions with her difficult.  Alphonsa Overall, MD, [facility] Pager: [phone] Office phone:  [phone].

## 2014-11-24 ENCOUNTER — Encounter (HOSPITAL_COMMUNITY): Payer: Self-pay | Admitting: Internal Medicine

## 2014-11-24 DIAGNOSIS — I5189 Other ill-defined heart diseases: Secondary | ICD-10-CM

## 2014-11-24 DIAGNOSIS — I5032 Chronic diastolic (congestive) heart failure: Secondary | ICD-10-CM | POA: Diagnosis present

## 2014-11-24 HISTORY — DX: Other ill-defined heart diseases: I51.89

## 2014-11-24 LAB — BASIC METABOLIC PANEL
ANION GAP: 9 (ref 5–15)
BUN: 20 mg/dL (ref 6–20)
CALCIUM: 10.3 mg/dL (ref 8.9–10.3)
CO2: 24 mmol/L (ref 22–32)
Chloride: 99 mmol/L — ABNORMAL LOW (ref 101–111)
Creatinine, Ser: 1.07 mg/dL (ref 0.61–1.24)
GFR calc non Af Amer: >60 mL/min (ref >60)
Glucose, Bld: 125 mg/dL — ABNORMAL HIGH (ref 65–99)
POTASSIUM: 3.8 mmol/L (ref 3.5–5.1)
SODIUM: 132 mmol/L — AB (ref 135–145)

## 2014-11-24 LAB — GLUCOSE, CAPILLARY
GLUCOSE-CAPILLARY: 97 mg/dL (ref 65–99)
GLUCOSE-CAPILLARY: 128 mg/dL — AB (ref 65–99)
Glucose-Capillary: 111 mg/dL — ABNORMAL HIGH (ref 65–99)
Glucose-Capillary: 120 mg/dL — ABNORMAL HIGH (ref 65–99)
Glucose-Capillary: 139 mg/dL — ABNORMAL HIGH (ref 65–99)

## 2014-11-24 LAB — CBC
HCT: 39.1 % (ref 39.0–52.0)
HEMOGLOBIN: 13.3 g/dL (ref 13.0–17.0)
MCH: 32 pg (ref 26.0–34.0)
MCHC: 34 g/dL (ref 30.0–36.0)
MCV: 94.2 fL (ref 78.0–100.0)
Platelets: 222 10*3/uL (ref 150–400)
RBC: 4.15 MIL/uL — ABNORMAL LOW (ref 4.22–5.81)
RDW: 12.4 % (ref 11.5–15.5)
WBC: 13.4 10*3/uL — ABNORMAL HIGH (ref 4.0–10.5)

## 2014-11-24 LAB — TROPONIN I: Troponin I: <0.03 ng/mL (ref <0.031)

## 2014-11-24 MED ORDER — FENTANYL CITRATE (PF) 100 MCG/2ML IJ SOLN: 12.5000 ug | Freq: Once | INTRAMUSCULAR | Status: DC

## 2014-11-24 NOTE — Progress Notes (Signed)
Progress Note   Eric [name] XBJ:[id number] DOB: [birth year] DOA: 11/21/2014 PCP: [name], MD   Brief Narrative:   Eric [name] is an 81 y.o. male with PMH of COPD, hypertension, and CAD who was admitted 11/21/14 with sigmoid diverticulitis and a contained microperforation. Surgery following.  Assessment/Plan:   Principal Problem:  Diverticulitis of colon with perforation / leukocytosis / abdominal pain - Patient was seen by surgery in consultation. They recommended continuing clear liquid diet for next 24 hours. - Continue IV Zosyn. - Continue pain management efforts. Pain is well controlled with Dilaudid 1 mg every 4 hours as needed for severe pain. - Advance diet to full liquid.  Active Problems:   Hyponatremia / Grade I diastolic dysfunction - Mild, monitor fluid volume balance. Daily weights ordered.   Type 2 diabetes mellitus, controlled without complications - H0Q about 7 months ago was 6.9 indicating good glycemic control. - Continue sliding scale insulin.   Dyslipidemia - Resume statin therapy once patient tolerates regular diet.   Essential hypertension - Currently hydralazine IV as needed for blood pressure control if needed.   Coronary atherosclerosis - Aspirin on hold until patient tolerates regular diet.    DVT Prophylaxis  - SCD's bilaterally in hospital.   Code Status: Full.  Family Communication: No family currently at the bedside. Disposition Plan: Home once tolerates regular diet and pain improved, likely 1-2 more days.   IV Access:    Peripheral IV   Procedures and diagnostic studies:   Ct Abdomen Pelvis W Contrast  11/21/2014   CLINICAL DATA:  81 year old male with 2-3 day history of lower abdominal pain, distention and constipation. Past history of diverticulitis and prostate cancer.  EXAM: CT ABDOMEN AND PELVIS WITH CONTRAST  TECHNIQUE: Multidetector CT imaging of the abdomen and pelvis was performed using the  standard protocol following bolus administration of intravenous contrast.  CONTRAST:  137mL OMNIPAQUE IOHEXOL 300 MG/ML  SOLN  COMPARISON:  Prior CT abdomen/ pelvis 03/13/2014  FINDINGS: Lower Chest: Dependent atelectasis in the lower lobes. The lungs are otherwise clear. Visualized cardiac structures within normal limits for size. No pericardial effusion. Sequelae of prior CABG and pacemaker placement. Unremarkable visualized distal thoracic esophagus.  Abdomen: Unremarkable CT appearance of the stomach, duodenum, spleen, adrenal glands. Diffuse fatty atrophy of the pancreas. No mass or inflammation. Multiple circumscribed low-attenuation lesions scattered throughout the liver the largest of which measures 4.4 cm and is water attenuation consistent with a simple cyst. Many of the other lesions are too small for accurate characterization but remain unchanged in size, number and configuration. These almost certainly represent additional small cysts or biliary hamartomas. It is stable to slightly decreased size of a soft tissue nodule posterior to the right liver along the peritoneal surface which measures 17 x 8 mm compared to 19 x 9 mm previously. While nonspecific in appearance, this abnormality is remains essentially unchanged dating back to 2011. Greater than 5 year stability is consistent with benignity. Gallbladder is unremarkable. No intra or extrahepatic biliary ductal dilatation.  Unremarkable appearance of the bilateral kidneys. No focal solid lesion, hydronephrosis or nephrolithiasis. 2.9 cm simple cyst in the left lower pole. Additional tiny low-attenuation lesions are too small for characterization but statistically likely also benign cysts.  Extensive sigmoid colonic diverticula. There is focal inflammation of the proximal sigmoid colon consistent with acute diverticulitis. A large air collection is present within the anterior colonic wall measuring 8.3 x 4.4 cm. This likely represents a contained  micro  perforation. No fluid component to definitively suggest abscess.  Just inferior to the inflamed portion of colon lies the reservoir for the patient's penile prosthesis. This prosthesis may be extraperitoneal. No evidence of small bowel obstruction. Normal appendix in the right lower quadrant. No free fluid or suspicious adenopathy.  Pelvis: Left lower quadrant penile prosthesis. Multiple brachytherapy seeds project over the bladder. No suspicious adenopathy or free fluid.  Bones/Soft Tissues: No acute fracture or aggressive appearing lytic or blastic osseous lesion. Right hip arthroplasty prosthesis. Lower lumbar degenerative disc disease.  Vascular: Atherosclerotic vascular disease without significant stenosis or aneurysmal dilatation.  IMPRESSION: 1. Sigmoid colonic diverticulitis with large intramural air collection within the anterior wall of the affected sigmoid segment concerning for contained micro perforation. No fluid component to suggest a drainable abscess. 2. Additional ancillary findings as above without significant interval change.   Electronically Signed   By: Jacqulynn Cadet M.D.   On: 11/21/2014 21:00     Medical Consultants:    Surgery   Anti-Infectives:    Zosyn 11/21/14--->  Subjective:   Eric [name] has left flank pain and nausea.  Has tolerated liquids. Complained of right shoulder pain in the night. The patient seemed quite anxious when I interviewed him, and repeatedly asked me the same questions.  Objective:    Filed Vitals:   11/23/14 0425 11/23/14 1445 11/23/14 2022 11/24/14 0426  BP: 133/98 92/72 118/49 123/69  Pulse: 59 61 76 71  Temp: 97.7 F (36.5 C) 97.5 F (36.4 C) 97.9 F (36.6 C) 97.7 F (36.5 C)  TempSrc: Axillary Oral Oral Axillary  Resp: 16 20 20 20   Height:      Weight:      SpO2: 98% 100% 98% 98%    Intake/Output Summary (Last 24 hours) at 11/24/14 0916 Last data filed at 11/24/14 0700  Gross per 24 hour  Intake   1350 ml  Output       0 ml  Net   1350 ml    Exam: Gen:  NAD, anxious Cardiovascular:  RRR, No M/R/G Respiratory:  Lungs with an occasional rhonchi Gastrointestinal:  Abdomen soft, tender in the left flank area, + BS Extremities:  No C/E/C   Data Reviewed:    Labs: Basic Metabolic Panel:  Recent Labs Lab 11/21/14 1700 11/22/14 0450 11/24/14 0439  NA 130* 133* 132*  K 4.6 4.8 3.8  CL 98* 99* 99*  CO2 23 24 24   GLUCOSE 157* 149* 125*  BUN 23* 22* 20  CREATININE 0.95 0.92 1.07  CALCIUM 11.1* 10.8* 10.3  MG  --  1.9  --   PHOS  --  3.2  --    GFR Estimated Creatinine Clearance: 55.3 mL/min (by C-G formula based on Cr of 1.07). Liver Function Tests:  Recent Labs Lab 11/21/14 1700 11/22/14 0450  AST 26 28  ALT 23 20  ALKPHOS 78 66  BILITOT 1.2 1.2  PROT 7.5 7.3  ALBUMIN 4.4 4.0    Recent Labs Lab 11/21/14 1700  LIPASE <10*   CBC:  Recent Labs Lab 11/21/14 1700 11/22/14 0450 11/23/14 0002 11/24/14 0018  WBC 20.1* 17.9* 18.6* 13.4*  NEUTROABS 18.2*  --   --   --   HGB 13.0 12.2* 12.4* 13.3  HCT 39.7 36.8* 37.5* 39.1  MCV 97.8 94.8 96.4 94.2  PLT 223 192 233 222   Cardiac Enzymes:  Recent Labs Lab 11/24/14 0439  TROPONINI <0.03   BNP (last 3 results)  Recent Labs  02/23/14 1232  PROBNP 35.0   CBG:  Recent Labs Lab 11/23/14 1653 11/23/14 2019 11/24/14 0053 11/24/14 0438 11/24/14 0740  GLUCAP 115* 181* 111* 120* 97   Hgb A1c:  Recent Labs  11/22/14 0450  HGBA1C 6.5*   Thyroid function studies:  Recent Labs  11/22/14 0450  TSH 0.672   Sepsis Labs:  Recent Labs Lab 11/21/14 1700 11/21/14 1903 11/21/14 2306 11/22/14 0450 11/23/14 0002 11/24/14 0018  WBC 20.1*  --   --  17.9* 18.6* 13.4*  LATICACIDVEN  --  1.26 1.2  --   --   --    Microbiology No results found for this or any previous visit (from the past 240 hour(s)).   Medications:   . insulin aspart  0-9 Units Subcutaneous 6 times per day  . piperacillin-tazobactam  (ZOSYN)  IV  3.375 g Intravenous 3 times per day  . sodium chloride  3 mL Intravenous Q12H   Continuous Infusions:   Time spent: 35 minutes with > 50% of time discussing current diagnostic test results, clinical impression and plan of care.   LOS: 3 days   [facility] Pager [phone]. If unable to reach me by pager, please call my cell phone at [phone].  *Please refer to [email or web address], password [email or web address] to get updated schedule on who will round on this patient, as hospitalists switch teams weekly. If 7PM-7AM, please contact night-coverage at [email or web address], password [email or web address] for any overnight needs.  11/24/2014, 9:16 AM

## 2014-11-24 NOTE — Progress Notes (Signed)
Patient ID: Eric [name], male   DOB: 05/07/1927, 81 y.o.   MRN: [id number]  [facility]  Subjective: Patient up in chair, friend at bedside.  Less pain today.  No nausea, tolerating liquid diet.  Objective: Vital signs in last 24 hours: Temp:  [97.5 F (36.4 C)-97.9 F (36.6 C)] 97.7 F (36.5 C) (06/18 0426) Pulse Rate:  [61-76] 71 (06/18 0426) Resp:  [20] 20 (06/18 0426) BP: (92-123)/(49-72) 123/69 mmHg (06/18 0426) SpO2:  [98 %-100 %] 98 % (06/18 0426) Last BM Date: 11/20/14  Intake/Output from previous day: 06/17 0701 - 06/18 0700 In: 1350 [P.O.:1200; IV Piggyback:150] Out: -  Intake/Output this shift: Total I/O In: 240 [P.O.:240] Out: -   Physical Exam: HEENT - sclerae clear, mucous membranes moist Neck - soft Chest - clear bilaterally Cor - RRR Abdomen - soft, non-tender; BS present; no mass Ext - no edema, non-tender Neuro - alert & oriented, no focal deficits  Lab Results:   Recent Labs  11/23/14 0002 11/24/14 0018  WBC 18.6* 13.4*  HGB 12.4* 13.3  HCT 37.5* 39.1  PLT 233 222   BMET  Recent Labs  11/22/14 0450 11/24/14 0439  NA 133* 132*  K 4.8 3.8  CL 99* 99*  CO2 24 24  GLUCOSE 149* 125*  BUN 22* 20  CREATININE 0.92 1.07  CALCIUM 10.8* 10.3   PT/INR No results for input(s): LABPROT, INR in the last 72 hours. Comprehensive Metabolic Panel:    Component Value Date/Time   NA 132* 11/24/2014 0439   NA 133* 11/22/2014 0450   NA 135* 04/11/2014   NA 138 04/12/2013   K 3.8 11/24/2014 0439   K 4.8 11/22/2014 0450   CL 99* 11/24/2014 0439   CL 99* 11/22/2014 0450   CO2 24 11/24/2014 0439   CO2 24 11/22/2014 0450   BUN 20 11/24/2014 0439   BUN 22* 11/22/2014 0450   BUN 11 04/11/2014   BUN 17 04/12/2013   CREATININE 1.07 11/24/2014 0439   CREATININE 0.92 11/22/2014 0450   CREATININE 1.0 04/11/2014   CREATININE 0.9 04/12/2013   GLUCOSE 125* 11/24/2014 0439   GLUCOSE 149* 11/22/2014 0450   CALCIUM 10.3 11/24/2014 0439   CALCIUM 10.8* 11/22/2014 0450   AST 28 11/22/2014 0450   AST 26 11/21/2014 1700   ALT 20 11/22/2014 0450   ALT 23 11/21/2014 1700   ALKPHOS 66 11/22/2014 0450   ALKPHOS 78 11/21/2014 1700   BILITOT 1.2 11/22/2014 0450   BILITOT 1.2 11/21/2014 1700   PROT 7.3 11/22/2014 0450   PROT 7.5 11/21/2014 1700   ALBUMIN 4.0 11/22/2014 0450   ALBUMIN 4.4 11/21/2014 1700    Studies/Results: No results found.  Anti-infectives: Anti-infectives    Start     Dose/Rate Route Frequency Ordered Stop   11/21/14 2330  piperacillin-tazobactam (ZOSYN) IVPB 3.375 g     3.375 g 12.5 mL/hr over 240 Minutes Intravenous 3 times per day 11/21/14 2325     11/21/14 2130  piperacillin-tazobactam (ZOSYN) IVPB 3.375 g     3.375 g 12.5 mL/hr over 240 Minutes Intravenous  Once 11/21/14 2127 11/22/14 0150   11/21/14 2115  metroNIDAZOLE (FLAGYL) IVPB 500 mg  Status:  Discontinued     500 mg 100 mL/hr over 60 Minutes Intravenous  Once 11/21/14 2109 11/21/14 2127      Assessment & Plans: 1. Diverticulitis with microperforation  Pain improving, no nausea on clear liquids  Consider advancing diet to full liquids  IV Zosyn  WBC improved to 13K - continue to follow  Consider repeat CT scan on Monday 6/20 (last scan 6/15)  Surgery will continue to follow with you  Earnstine Regal, MD, [facility] Office: [place] 11/24/2014

## 2014-11-25 LAB — GLUCOSE, CAPILLARY
GLUCOSE-CAPILLARY: 112 mg/dL — AB (ref 65–99)
GLUCOSE-CAPILLARY: 132 mg/dL — AB (ref 65–99)
GLUCOSE-CAPILLARY: 75 mg/dL (ref 65–99)
Glucose-Capillary: 114 mg/dL — ABNORMAL HIGH (ref 65–99)
Glucose-Capillary: 100 mg/dL — ABNORMAL HIGH (ref 65–99)
Glucose-Capillary: 125 mg/dL — ABNORMAL HIGH (ref 65–99)

## 2014-11-25 LAB — BASIC METABOLIC PANEL
Anion gap: 4 — ABNORMAL LOW (ref 5–15)
BUN: 17 mg/dL (ref 6–20)
CALCIUM: 10.6 mg/dL — AB (ref 8.9–10.3)
CO2: 28 mmol/L (ref 22–32)
Chloride: 99 mmol/L — ABNORMAL LOW (ref 101–111)
Creatinine, Ser: 1.06 mg/dL (ref 0.61–1.24)
GFR calc Af Amer: >60 mL/min (ref >60)
GLUCOSE: 113 mg/dL — AB (ref 65–99)
POTASSIUM: 3.9 mmol/L (ref 3.5–5.1)
Sodium: 131 mmol/L — ABNORMAL LOW (ref 135–145)

## 2014-11-25 LAB — CBC
HCT: 41.9 % (ref 39.0–52.0)
HEMOGLOBIN: 13.8 g/dL (ref 13.0–17.0)
MCH: 32.2 pg (ref 26.0–34.0)
MCHC: 32.9 g/dL (ref 30.0–36.0)
MCV: 97.7 fL (ref 78.0–100.0)
Platelets: 257 10*3/uL (ref 150–400)
RBC: 4.29 MIL/uL (ref 4.22–5.81)
RDW: 12.7 % (ref 11.5–15.5)
WBC: 12.6 10*3/uL — ABNORMAL HIGH (ref 4.0–10.5)

## 2014-11-25 MED ORDER — SIMETHICONE 80 MG PO CHEW
80.0000 mg | CHEWABLE_TABLET | Freq: Four times a day (QID) | ORAL | Status: DC
  Administered 2014-11-25 – 2014-11-28 (×12): 80 mg via ORAL
  Filled 2014-11-25 (×14): qty 1

## 2014-11-25 MED ORDER — CALCIUM CARBONATE ANTACID 500 MG PO CHEW
1.0000 | CHEWABLE_TABLET | Freq: Four times a day (QID) | ORAL | Status: DC | PRN
  Administered 2014-11-25 – 2014-11-26 (×3): 200 mg via ORAL
  Filled 2014-11-25 (×3): qty 1

## 2014-11-25 NOTE — Progress Notes (Signed)
Progress Note   [name] QPY:[id number] DOB: [birth year] DOA: 11/21/2014 PCP: [name], MD   Brief Narrative:   Eric [name] is an 81 y.o. male with PMH of COPD, hypertension, and CAD who was admitted 11/21/14 with sigmoid diverticulitis and a contained microperforation. Surgery following.  Assessment/Plan:   Principal Problem:  Diverticulitis of colon with perforation / leukocytosis / abdominal pain - Continue IV Zosyn. - Continue pain management efforts. Pain is well controlled with Dilaudid 1 mg every 4 hours as needed for severe pain. - Diet advanced to full liquids 11/24/14. - Surgery following, plans to repeat CT scan 11/26/14.  Active Problems:   Hyponatremia / Grade I diastolic dysfunction - Mild, monitor fluid volume balance. I/O balance +1.9 L. Weight down from admission weight. Continue to monitor daily.   Type 2 diabetes mellitus, controlled without complications - T2W about 7 months ago was 6.9 indicating good glycemic control. - Continue sliding scale insulin. CBG 100-139.   Dyslipidemia - Resume statin therapy once patient tolerates regular diet.   Essential hypertension - Currently hydralazine IV as needed for blood pressure control if needed.   Coronary atherosclerosis - Aspirin on hold until patient tolerates regular diet.    DVT Prophylaxis  - SCD's bilaterally in hospital.   Code Status: Full.  Family Communication: No family currently at the bedside. Disposition Plan: Home once tolerates regular diet and pain improved, likely 1-2 more days.   IV Access:    Peripheral IV   Procedures and diagnostic studies:   Ct Abdomen Pelvis W Contrast  11/21/2014   CLINICAL DATA:  81 year old male with 2-3 day history of lower abdominal pain, distention and constipation. Past history of diverticulitis and prostate cancer.  EXAM: CT ABDOMEN AND PELVIS WITH CONTRAST  TECHNIQUE: Multidetector CT imaging of the abdomen and pelvis  was performed using the standard protocol following bolus administration of intravenous contrast.  CONTRAST:  178mL OMNIPAQUE IOHEXOL 300 MG/ML  SOLN  COMPARISON:  Prior CT abdomen/ pelvis 03/13/2014  FINDINGS: Lower Chest: Dependent atelectasis in the lower lobes. The lungs are otherwise clear. Visualized cardiac structures within normal limits for size. No pericardial effusion. Sequelae of prior CABG and pacemaker placement. Unremarkable visualized distal thoracic esophagus.  Abdomen: Unremarkable CT appearance of the stomach, duodenum, spleen, adrenal glands. Diffuse fatty atrophy of the pancreas. No mass or inflammation. Multiple circumscribed low-attenuation lesions scattered throughout the liver the largest of which measures 4.4 cm and is water attenuation consistent with a simple cyst. Many of the other lesions are too small for accurate characterization but remain unchanged in size, number and configuration. These almost certainly represent additional small cysts or biliary hamartomas. It is stable to slightly decreased size of a soft tissue nodule posterior to the right liver along the peritoneal surface which measures 17 x 8 mm compared to 19 x 9 mm previously. While nonspecific in appearance, this abnormality is remains essentially unchanged dating back to 2011. Greater than 5 year stability is consistent with benignity. Gallbladder is unremarkable. No intra or extrahepatic biliary ductal dilatation.  Unremarkable appearance of the bilateral kidneys. No focal solid lesion, hydronephrosis or nephrolithiasis. 2.9 cm simple cyst in the left lower pole. Additional tiny low-attenuation lesions are too small for characterization but statistically likely also benign cysts.  Extensive sigmoid colonic diverticula. There is focal inflammation of the proximal sigmoid colon consistent with acute diverticulitis. A large air collection is present within the anterior colonic wall measuring 8.3 x 4.4 cm. This  likely  represents a contained micro perforation. No fluid component to definitively suggest abscess.  Just inferior to the inflamed portion of colon lies the reservoir for the patient's penile prosthesis. This prosthesis may be extraperitoneal. No evidence of small bowel obstruction. Normal appendix in the right lower quadrant. No free fluid or suspicious adenopathy.  Pelvis: Left lower quadrant penile prosthesis. Multiple brachytherapy seeds project over the bladder. No suspicious adenopathy or free fluid.  Bones/Soft Tissues: No acute fracture or aggressive appearing lytic or blastic osseous lesion. Right hip arthroplasty prosthesis. Lower lumbar degenerative disc disease.  Vascular: Atherosclerotic vascular disease without significant stenosis or aneurysmal dilatation.  IMPRESSION: 1. Sigmoid colonic diverticulitis with large intramural air collection within the anterior wall of the affected sigmoid segment concerning for contained micro perforation. No fluid component to suggest a drainable abscess. 2. Additional ancillary findings as above without significant interval change.   Electronically Signed   By: Jacqulynn Cadet M.D.   On: 11/21/2014 21:00     Medical Consultants:    Surgery   Anti-Infectives:    Zosyn 11/21/14--->  Subjective:   Eric [name] has left flank pain and periumbilical pain and continues to be very anxious, insisting "I can't go home like this. I live alone."  Wants to eat solid foods, but says he is afraid to advance his diet.  Objective:    Filed Vitals:   11/24/14 0426 11/24/14 1353 11/24/14 2042 11/25/14 0458  BP: 123/69 120/67 141/66 107/58  Pulse: 71 81 81 65  Temp: 97.7 F (36.5 C) 97.8 F (36.6 C) 97.8 F (36.6 C) 97.6 F (36.4 C)  TempSrc: Axillary Oral Oral Oral  Resp: 20 20 20 20   Height:      Weight:    95.165 kg (209 lb 12.8 oz)  SpO2: 98% 94% 100% 94%    Intake/Output Summary (Last 24 hours) at 11/25/14 0818 Last data filed at 11/25/14 0801   Gross per 24 hour  Intake   1910 ml  Output      0 ml  Net   1910 ml    Exam: Gen:  NAD, anxious Cardiovascular:  RRR, No M/R/G Respiratory:  Lungs clear Gastrointestinal:  Abdomen soft, tender in the left flank area and periumbilical area, + BS Extremities:  No C/E/C   Data Reviewed:    Labs: Basic Metabolic Panel:  Recent Labs Lab 11/21/14 1700 11/22/14 0450 11/24/14 0439 11/25/14 0518  NA 130* 133* 132* 131*  K 4.6 4.8 3.8 3.9  CL 98* 99* 99* 99*  CO2 23 24 24 28   GLUCOSE 157* 149* 125* 113*  BUN 23* 22* 20 17  CREATININE 0.95 0.92 1.07 1.06  CALCIUM 11.1* 10.8* 10.3 10.6*  MG  --  1.9  --   --   PHOS  --  3.2  --   --    GFR Estimated Creatinine Clearance: 55.1 mL/min (by C-G formula based on Cr of 1.06). Liver Function Tests:  Recent Labs Lab 11/21/14 1700 11/22/14 0450  AST 26 28  ALT 23 20  ALKPHOS 78 66  BILITOT 1.2 1.2  PROT 7.5 7.3  ALBUMIN 4.4 4.0    Recent Labs Lab 11/21/14 1700  LIPASE <10*   CBC:  Recent Labs Lab 11/21/14 1700 11/22/14 0450 11/23/14 0002 11/24/14 0018 11/25/14 0518  WBC 20.1* 17.9* 18.6* 13.4* 12.6*  NEUTROABS 18.2*  --   --   --   --   HGB 13.0 12.2* 12.4* 13.3 13.8  HCT 39.7 36.8*  37.5* 39.1 41.9  MCV 97.8 94.8 96.4 94.2 97.7  PLT 223 192 233 222 257   Cardiac Enzymes:  Recent Labs Lab 11/24/14 0439 11/24/14 1031 11/24/14 1625  TROPONINI <0.03 <0.03 <0.03   BNP (last 3 results)  Recent Labs  02/23/14 1232  PROBNP 35.0   CBG:  Recent Labs Lab 11/24/14 1648 11/24/14 2036 11/25/14 0021 11/25/14 0457 11/25/14 0719  GLUCAP 139* 132* 112* 114* 100*   Hgb A1c: No results for input(s): HGBA1C in the last 72 hours. Thyroid function studies: No results for input(s): TSH, T4TOTAL, T3FREE, THYROIDAB in the last 72 hours.  Invalid input(s): FREET3 Sepsis Labs:  Recent Labs Lab 11/21/14 1903 11/21/14 2306 11/22/14 0450 11/23/14 0002 11/24/14 0018 11/25/14 0518  WBC  --   --  17.9*  18.6* 13.4* 12.6*  LATICACIDVEN 1.26 1.2  --   --   --   --    Microbiology No results found for this or any previous visit (from the past 240 hour(s)).   Medications:   . insulin aspart  0-9 Units Subcutaneous 6 times per day  . piperacillin-tazobactam (ZOSYN)  IV  3.375 g Intravenous 3 times per day  . sodium chloride  3 mL Intravenous Q12H   Continuous Infusions:   Time spent: 25 minutes.   LOS: 4 days   [facility] Pager [phone]. If unable to reach me by pager, please call my cell phone at [phone].  *Please refer to [email or web address], password [email or web address] to get updated schedule on who will round on this patient, as hospitalists switch teams weekly. If 7PM-7AM, please contact night-coverage at [email or web address], password [email or web address] for any overnight needs.  11/25/2014, 8:18 AM

## 2014-11-25 NOTE — Progress Notes (Signed)
General Surgery Note  LOS: 4 days  POD -     Assessment/Plan: 1.  Sigmoid colon diverticulitis with microperforation.  On Zosyn  WBC continues to get better.  On full liquids  2. Hard of hearing  3. COPD 4 HTN 5.  Has pacemaker - followed by Dr. Caryl Comes. 6.  Anxious    Principal Problem:   Diverticulitis of colon with perforation Active Problems:   Type 2 diabetes mellitus, controlled   Dyslipidemia   Essential hypertension   Coronary atherosclerosis   GERD   Pacemaker   GERD (gastroesophageal reflux disease)   Leukocytosis   Hyponatremia   Diastolic dysfunction, Grade 1   Subjective:  Doing okay, but has pain in left back.  The location of the current pain seems unrelated to his diverticulitis.  He said that he has had some back trouble.  Objective:   Filed Vitals:   11/25/14 0458  BP: 107/58  Pulse: 65  Temp: 97.6 F (36.4 C)  Resp: 20     Intake/Output from previous day:  06/18 0701 - 06/19 0700 In: 1370 [P.O.:1200; IV Piggyback:150] Out: -   Intake/Output this shift:  Total I/O In: 540 [P.O.:540] Out: -    Physical Exam:   General: Older obese WM who is alert.  He is anxious and hard of hearing.   HEENT: Normal. Pupils equal. .   Lungs: Clear.   Abdomen: Soft. BS.   Lab Results:    Recent Labs  11/24/14 0018 11/25/14 0518  WBC 13.4* 12.6*  HGB 13.3 13.8  HCT 39.1 41.9  PLT 222 257    BMET   Recent Labs  11/24/14 0439 11/25/14 0518  NA 132* 131*  K 3.8 3.9  CL 99* 99*  CO2 24 28  GLUCOSE 125* 113*  BUN 20 17  CREATININE 1.07 1.06  CALCIUM 10.3 10.6*    PT/INR  No results for input(s): LABPROT, INR in the last 72 hours.  ABG  No results for input(s): PHART, HCO3 in the last 72 hours.  Invalid input(s): PCO2, PO2   Studies/Results:  No results found.   Anti-infectives:   Anti-infectives    Start     Dose/Rate Route Frequency Ordered Stop   11/21/14 2330  piperacillin-tazobactam (ZOSYN) IVPB 3.375 g     3.375  g 12.5 mL/hr over 240 Minutes Intravenous 3 times per day 11/21/14 2325     11/21/14 2130  piperacillin-tazobactam (ZOSYN) IVPB 3.375 g     3.375 g 12.5 mL/hr over 240 Minutes Intravenous  Once 11/21/14 2127 11/22/14 0150   11/21/14 2115  metroNIDAZOLE (FLAGYL) IVPB 500 mg  Status:  Discontinued     500 mg 100 mL/hr over 60 Minutes Intravenous  Once 11/21/14 2109 11/21/14 2127      Alphonsa Overall, MD, FACS Pager: [facility]: [phone] 11/25/2014

## 2014-11-25 NOTE — Plan of Care (Signed)
Problem: Discharge Progression Outcomes Goal: Tolerating diet Outcome: Progressing No N/V with advanced diet, still c/o abd pain/discomfort

## 2014-11-26 ENCOUNTER — Inpatient Hospital Stay (HOSPITAL_COMMUNITY): Payer: Medicare Other

## 2014-11-26 LAB — CBC
HCT: 40 % (ref 39.0–52.0)
HEMOGLOBIN: 13.1 g/dL (ref 13.0–17.0)
MCH: 31.4 pg (ref 26.0–34.0)
MCHC: 32.8 g/dL (ref 30.0–36.0)
MCV: 95.9 fL (ref 78.0–100.0)
Platelets: 243 10*3/uL (ref 150–400)
RBC: 4.17 MIL/uL — ABNORMAL LOW (ref 4.22–5.81)
RDW: 12.5 % (ref 11.5–15.5)
WBC: 11.8 10*3/uL — ABNORMAL HIGH (ref 4.0–10.5)

## 2014-11-26 LAB — GLUCOSE, CAPILLARY
GLUCOSE-CAPILLARY: 134 mg/dL — AB (ref 65–99)
GLUCOSE-CAPILLARY: 124 mg/dL — AB (ref 65–99)
GLUCOSE-CAPILLARY: 119 mg/dL — AB (ref 65–99)
GLUCOSE-CAPILLARY: 166 mg/dL — AB (ref 65–99)
GLUCOSE-CAPILLARY: 104 mg/dL — AB (ref 65–99)
Glucose-Capillary: 100 mg/dL — ABNORMAL HIGH (ref 65–99)
Glucose-Capillary: 115 mg/dL — ABNORMAL HIGH (ref 65–99)

## 2014-11-26 LAB — BASIC METABOLIC PANEL
Anion gap: 6 (ref 5–15)
BUN: 15 mg/dL (ref 6–20)
CO2: 27 mmol/L (ref 22–32)
Calcium: 11 mg/dL — ABNORMAL HIGH (ref 8.9–10.3)
Chloride: 100 mmol/L — ABNORMAL LOW (ref 101–111)
Creatinine, Ser: 1.05 mg/dL (ref 0.61–1.24)
Glucose, Bld: 119 mg/dL — ABNORMAL HIGH (ref 65–99)
Potassium: 4 mmol/L (ref 3.5–5.1)
Sodium: 133 mmol/L — ABNORMAL LOW (ref 135–145)

## 2014-11-26 MED ORDER — POTASSIUM CHLORIDE IN NACL 20-0.9 MEQ/L-% IV SOLN
INTRAVENOUS | Status: DC
  Administered 2014-11-26 – 2014-11-28 (×3): via INTRAVENOUS
  Filled 2014-11-26 (×4): qty 1000

## 2014-11-26 MED ORDER — IOHEXOL 300 MG/ML  SOLN
25.0000 mL | INTRAMUSCULAR | Status: AC
  Administered 2014-11-26 (×2): 25 mL via ORAL

## 2014-11-26 MED ORDER — IOHEXOL 300 MG/ML  SOLN
100.0000 mL | Freq: Once | INTRAMUSCULAR | Status: AC | PRN
  Administered 2014-11-26: 100 mL via INTRAVENOUS

## 2014-11-26 MED ORDER — ERTAPENEM SODIUM 1 G IJ SOLR
1.0000 g | INTRAMUSCULAR | Status: DC
  Administered 2014-11-26 – 2014-12-03 (×8): 1 g via INTRAVENOUS
  Filled 2014-11-26 (×12): qty 1

## 2014-11-26 NOTE — Progress Notes (Signed)
PT Cancellation Note  Patient Details Name: Eric [name] MRN: [id number] DOB: [birth year]   Cancelled Treatment:    Reason Eval/Treat Not Completed: PT screened, no needs identified, will sign off Witness pt yesterday making laps independinglty around the unit, and nurse today reports pt makes laps around the unit with no difficulty. Checked with pt who stated frustratedly, "i don't need any PT, did you just hear what I found out?". No PT needs at this time.    Clide Dales 11/26/2014, 4:28 PM  Clide Dales, PT Pager: [phone] 11/26/2014

## 2014-11-26 NOTE — Progress Notes (Signed)
Patient ID: Eric [name], male   DOB: [birth year], 81 y.o.   MRN: [id number]    Subjective: Pt feels overall better, but still getting intermittent sharper pains, such as last night.  He does not attribute this to his oral intake.    Objective: Vital signs in last 24 hours: Temp:  [97.8 F (36.6 C)-98 F (36.7 C)] 97.8 F (36.6 C) (06/20 0930) Pulse Rate:  [71-79] 71 (06/20 0930) Resp:  [16-20] 16 (06/20 0930) BP: (106-116)/(38-59) 110/48 mmHg (06/20 0930) SpO2:  [95 %-98 %] 96 % (06/20 0930) Weight:  [95.165 kg (209 lb 12.8 oz)] 95.165 kg (209 lb 12.8 oz) (06/20 0415) Last BM Date: 11/25/14  Intake/Output from previous day: 06/19 0701 - 06/20 0700 In: 1690 [P.O.:1500; IV Piggyback:150] Out: -  Intake/Output this shift: Total I/O In: 180 [P.O.:180] Out: -   PE: Abd: soft, still tender, but it is less than last week when I saw him, +BS, ND Heart: regular LungS: CTAB  Lab Results:   Recent Labs  11/25/14 0518 11/26/14 0515  WBC 12.6* 11.8*  HGB 13.8 13.1  HCT 41.9 40.0  PLT 257 243   BMET  Recent Labs  11/25/14 0518 11/26/14 0515  NA 131* 133*  K 3.9 4.0  CL 99* 100*  CO2 28 27  GLUCOSE 113* 119*  BUN 17 15  CREATININE 1.06 1.05  CALCIUM 10.6* 11.0*   PT/INR No results for input(s): LABPROT, INR in the last 72 hours. CMP     Component Value Date/Time   NA 133* 11/26/2014 0515   NA 135* 04/11/2014   K 4.0 11/26/2014 0515   CL 100* 11/26/2014 0515   CO2 27 11/26/2014 0515   GLUCOSE 119* 11/26/2014 0515   BUN 15 11/26/2014 0515   BUN 11 04/11/2014   CREATININE 1.05 11/26/2014 0515   CREATININE 1.0 04/11/2014   CALCIUM 11.0* 11/26/2014 0515   PROT 7.3 11/22/2014 0450   ALBUMIN 4.0 11/22/2014 0450   AST 28 11/22/2014 0450   ALT 20 11/22/2014 0450   ALKPHOS 66 11/22/2014 0450   BILITOT 1.2 11/22/2014 0450   GFRNONAA >60 11/26/2014 0515   GFRAA >60 11/26/2014 0515   Lipase     Component Value Date/Time   LIPASE <10* 11/21/2014 1700        Studies/Results: No results found.  Anti-infectives: Anti-infectives    Start     Dose/Rate Route Frequency Ordered Stop   11/21/14 2330  piperacillin-tazobactam (ZOSYN) IVPB 3.375 g     3.375 g 12.5 mL/hr over 240 Minutes Intravenous 3 times per day 11/21/14 2325     11/21/14 2130  piperacillin-tazobactam (ZOSYN) IVPB 3.375 g     3.375 g 12.5 mL/hr over 240 Minutes Intravenous  Once 11/21/14 2127 11/22/14 0150   11/21/14 2115  metroNIDAZOLE (FLAGYL) IVPB 500 mg  Status:  Discontinued     500 mg 100 mL/hr over 60 Minutes Intravenous  Once 11/21/14 2109 11/21/14 2127       Assessment/Plan 1. Diverticulitis with microperforation -patient overall feels better, but now staying relatively stagnant and not progressing as much as we would anticipate.  Agree with repeat CT scan to evaluate diverticulitis. -cont fulls for now.  If scan improved, then can likely give solid diet, if scan worsened then may need to reconsider diet intake. -will follow -cont IV zosyn D6 2. Hard of hearing  3. COPD 4 HTN    LOS: 5 days    Adie Vilar E 11/26/2014, 12:13 PM Pager: [phone]

## 2014-11-26 NOTE — Progress Notes (Signed)
Progress Note   LUCCAS [name] ZDG:[id number] DOB: 03/20/1927 DOA: 11/21/2014 PCP: [name], MD   Brief Narrative:   Eric [name] is an 81 y.o. male with PMH of COPD, hypertension, and CAD who was admitted 11/21/14 with sigmoid diverticulitis and a contained microperforation. Surgery following.  Assessment/Plan:   Principal Problem:  Diverticulitis of colon with perforation / leukocytosis / abdominal pain - Continue IV Zosyn. - Continue pain management efforts. Still having severe pain. - Diet advanced to full liquids 11/24/14. No nausea or vomiting. - Surgery following, repeat CT scan given ongoing abdominal pain.  Active Problems:   Hyponatremia / Grade I diastolic dysfunction - Mild, monitor fluid volume balance. I/O balance +1.7 L. Weight down from admission weight, but stable over the last 24 hours.    Type 2 diabetes mellitus, controlled without complications - V9D about 7 months ago was 6.9 indicating good glycemic control. - Continue sliding scale insulin. CBG 75-134.   Dyslipidemia - Resume statin therapy once patient tolerates regular diet.   Essential hypertension - Currently hydralazine IV as needed for blood pressure control if needed.   Coronary atherosclerosis - Aspirin on hold until patient tolerates regular diet.    DVT Prophylaxis  - SCD's bilaterally in hospital.   Code Status: Full.  Family Communication: No family currently at the bedside. Disposition Plan: Home once tolerates regular diet and pain improved, likely 1-2 more days.   IV Access:    Peripheral IV   Procedures and diagnostic studies:   Ct Abdomen Pelvis W Contrast  11/21/2014   CLINICAL DATA:  81 year old male with 2-3 day history of lower abdominal pain, distention and constipation. Past history of diverticulitis and prostate cancer.  EXAM: CT ABDOMEN AND PELVIS WITH CONTRAST  TECHNIQUE: Multidetector CT imaging of the abdomen and pelvis was performed  using the standard protocol following bolus administration of intravenous contrast.  CONTRAST:  143mL OMNIPAQUE IOHEXOL 300 MG/ML  SOLN  COMPARISON:  Prior CT abdomen/ pelvis 03/13/2014  FINDINGS: Lower Chest: Dependent atelectasis in the lower lobes. The lungs are otherwise clear. Visualized cardiac structures within normal limits for size. No pericardial effusion. Sequelae of prior CABG and pacemaker placement. Unremarkable visualized distal thoracic esophagus.  Abdomen: Unremarkable CT appearance of the stomach, duodenum, spleen, adrenal glands. Diffuse fatty atrophy of the pancreas. No mass or inflammation. Multiple circumscribed low-attenuation lesions scattered throughout the liver the largest of which measures 4.4 cm and is water attenuation consistent with a simple cyst. Many of the other lesions are too small for accurate characterization but remain unchanged in size, number and configuration. These almost certainly represent additional small cysts or biliary hamartomas. It is stable to slightly decreased size of a soft tissue nodule posterior to the right liver along the peritoneal surface which measures 17 x 8 mm compared to 19 x 9 mm previously. While nonspecific in appearance, this abnormality is remains essentially unchanged dating back to 2011. Greater than 5 year stability is consistent with benignity. Gallbladder is unremarkable. No intra or extrahepatic biliary ductal dilatation.  Unremarkable appearance of the bilateral kidneys. No focal solid lesion, hydronephrosis or nephrolithiasis. 2.9 cm simple cyst in the left lower pole. Additional tiny low-attenuation lesions are too small for characterization but statistically likely also benign cysts.  Extensive sigmoid colonic diverticula. There is focal inflammation of the proximal sigmoid colon consistent with acute diverticulitis. A large air collection is present within the anterior colonic wall measuring 8.3 x 4.4 cm. This likely represents a  contained micro perforation. No fluid component to definitively suggest abscess.  Just inferior to the inflamed portion of colon lies the reservoir for the patient's penile prosthesis. This prosthesis may be extraperitoneal. No evidence of small bowel obstruction. Normal appendix in the right lower quadrant. No free fluid or suspicious adenopathy.  Pelvis: Left lower quadrant penile prosthesis. Multiple brachytherapy seeds project over the bladder. No suspicious adenopathy or free fluid.  Bones/Soft Tissues: No acute fracture or aggressive appearing lytic or blastic osseous lesion. Right hip arthroplasty prosthesis. Lower lumbar degenerative disc disease.  Vascular: Atherosclerotic vascular disease without significant stenosis or aneurysmal dilatation.  IMPRESSION: 1. Sigmoid colonic diverticulitis with large intramural air collection within the anterior wall of the affected sigmoid segment concerning for contained micro perforation. No fluid component to suggest a drainable abscess. 2. Additional ancillary findings as above without significant interval change.   Electronically Signed   By: Jacqulynn Cadet M.D.   On: 11/21/2014 21:00     Medical Consultants:    Surgery   Anti-Infectives:    Zosyn 11/21/14--->  Subjective:   [name] has left flank pain and periumbilical pain and continues to be very anxious, insisting "I can't go home like this. I live alone."  Wants to eat solid foods, but says he is afraid to advance his diet.  Objective:    Filed Vitals:   11/25/14 0458 11/25/14 1328 11/25/14 2024 11/26/14 0415  BP: 107/58 110/47 106/38 116/59  Pulse: 65 72 79 72  Temp: 97.6 F (36.4 C) 98 F (36.7 C) 98 F (36.7 C) 98 F (36.7 C)  TempSrc: Oral Oral Oral Oral  Resp: 20 20 20 20   Height:      Weight: 95.165 kg (209 lb 12.8 oz)   95.165 kg (209 lb 12.8 oz)  SpO2: 94% 95% 98% 98%    Intake/Output Summary (Last 24 hours) at 11/26/14 0736 Last data filed at 11/26/14  0507  Gross per 24 hour  Intake   1690 ml  Output      0 ml  Net   1690 ml    Exam: Gen:  NAD, anxious Cardiovascular:  RRR, No M/R/G Respiratory:  Lungs clear Gastrointestinal:  Abdomen soft, tender in the periumbilical area, + BS Extremities:  No C/E/C   Data Reviewed:    Labs: Basic Metabolic Panel:  Recent Labs Lab 11/21/14 1700 11/22/14 0450 11/24/14 0439 11/25/14 0518 11/26/14 0515  NA 130* 133* 132* 131* 133*  K 4.6 4.8 3.8 3.9 4.0  CL 98* 99* 99* 99* 100*  CO2 23 24 24 28 27   GLUCOSE 157* 149* 125* 113* 119*  BUN 23* 22* 20 17 15   CREATININE 0.95 0.92 1.07 1.06 1.05  CALCIUM 11.1* 10.8* 10.3 10.6* 11.0*  MG  --  1.9  --   --   --   PHOS  --  3.2  --   --   --    GFR Estimated Creatinine Clearance: 55.6 mL/min (by C-G formula based on Cr of 1.05). Liver Function Tests:  Recent Labs Lab 11/21/14 1700 11/22/14 0450  AST 26 28  ALT 23 20  ALKPHOS 78 66  BILITOT 1.2 1.2  PROT 7.5 7.3  ALBUMIN 4.4 4.0    Recent Labs Lab 11/21/14 1700  LIPASE <10*   CBC:  Recent Labs Lab 11/21/14 1700 11/22/14 0450 11/23/14 0002 11/24/14 0018 11/25/14 0518 11/26/14 0515  WBC 20.1* 17.9* 18.6* 13.4* 12.6* 11.8*  NEUTROABS 18.2*  --   --   --   --   --  HGB 13.0 12.2* 12.4* 13.3 13.8 13.1  HCT 39.7 36.8* 37.5* 39.1 41.9 40.0  MCV 97.8 94.8 96.4 94.2 97.7 95.9  PLT 223 192 233 222 257 243   Cardiac Enzymes:  Recent Labs Lab 11/24/14 0439 11/24/14 1031 11/24/14 1625  TROPONINI <0.03 <0.03 <0.03   BNP (last 3 results)  Recent Labs  02/23/14 1232  PROBNP 35.0   CBG:  Recent Labs Lab 11/25/14 1128 11/25/14 1636 11/25/14 2023 11/26/14 0006 11/26/14 0412  GLUCAP 125* 75 134* 100* 124*   Hgb A1c: No results for input(s): HGBA1C in the last 72 hours. Thyroid function studies: No results for input(s): TSH, T4TOTAL, T3FREE, THYROIDAB in the last 72 hours.  Invalid input(s): FREET3 Sepsis Labs:  Recent Labs Lab 11/21/14 1903  11/21/14 2306  11/23/14 0002 11/24/14 0018 11/25/14 0518 11/26/14 0515  WBC  --   --   < > 18.6* 13.4* 12.6* 11.8*  LATICACIDVEN 1.26 1.2  --   --   --   --   --   < > = values in this interval not displayed. Microbiology No results found for this or any previous visit (from the past 240 hour(s)).   Medications:   . insulin aspart  0-9 Units Subcutaneous 6 times per day  . piperacillin-tazobactam (ZOSYN)  IV  3.375 g Intravenous 3 times per day  . simethicone  80 mg Oral QID  . sodium chloride  3 mL Intravenous Q12H   Continuous Infusions:   Time spent: 25 minutes.   LOS: 5 days   [facility] Pager [phone]. If unable to reach me by pager, please call my cell phone at [phone].  *Please refer to [email or web address], password [email or web address] to get updated schedule on who will round on this patient, as hospitalists switch teams weekly. If 7PM-7AM, please contact night-coverage at [email or web address], password [email or web address] for any overnight needs.  11/26/2014, 7:36 AM

## 2014-11-27 LAB — GLUCOSE, CAPILLARY
GLUCOSE-CAPILLARY: 90 mg/dL (ref 65–99)
GLUCOSE-CAPILLARY: 92 mg/dL (ref 65–99)
GLUCOSE-CAPILLARY: 94 mg/dL (ref 65–99)
GLUCOSE-CAPILLARY: 99 mg/dL (ref 65–99)
Glucose-Capillary: 84 mg/dL (ref 65–99)
Glucose-Capillary: 94 mg/dL (ref 65–99)
Glucose-Capillary: 98 mg/dL (ref 65–99)

## 2014-11-27 LAB — BASIC METABOLIC PANEL
Anion gap: 7 (ref 5–15)
BUN: 13 mg/dL (ref 6–20)
CO2: 25 mmol/L (ref 22–32)
Calcium: 10.5 mg/dL — ABNORMAL HIGH (ref 8.9–10.3)
Chloride: 101 mmol/L (ref 101–111)
Creatinine, Ser: 0.9 mg/dL (ref 0.61–1.24)
GFR calc non Af Amer: >60 mL/min (ref >60)
Glucose, Bld: 105 mg/dL — ABNORMAL HIGH (ref 65–99)
POTASSIUM: 4.2 mmol/L (ref 3.5–5.1)
SODIUM: 133 mmol/L — AB (ref 135–145)

## 2014-11-27 NOTE — Progress Notes (Signed)
Progress Note   Eric [name] GXQ:[id number] DOB: 1927-05-28 DOA: 11/21/2014 PCP: [name], MD   Brief Narrative:   Eric [name] is an 81 y.o. male with PMH of COPD, hypertension, and CAD who was admitted 11/21/14 with sigmoid diverticulitis and a contained microperforation. Surgery following.  Assessment/Plan:   Principal Problem:  Diverticulitis of colon with perforation / leukocytosis / abdominal pain - Has been on Zosyn, antibiotics changed to Invanz by surgery 11/26/14. - Continue pain management efforts. Pain improved. - Repeat CT scan 11/26/14 showed worsening free air, surgery aware of findings. - Now NPO, refused surgery but may need to revisit this if his condition does not improve.  Active Problems:   Hyponatremia / Grade I diastolic dysfunction - Mild, monitor fluid volume balance. I/O balance +1.1 L. Weight down from admission weight.    Type 2 diabetes mellitus, controlled without complications - X4G about 7 months ago was 6.9 indicating good glycemic control. - Continue sliding scale insulin. CBG 94-166.   Dyslipidemia - Resume statin therapy once patient once diet advanced.   Essential hypertension - Currently hydralazine IV as needed for blood pressure control if needed.   Coronary atherosclerosis - Aspirin on hold until patient tolerates regular diet.    DVT Prophylaxis  - SCD's bilaterally in hospital.   Code Status: Full.  Family Communication: Daughter updated at the bedside. Disposition Plan: Home once tolerates regular diet and pain improved, hard to tell when he will be ready for discharge as surgery may still need to be pursued.   IV Access:    Peripheral IV   Procedures and diagnostic studies:   Ct Abdomen Pelvis W Contrast  11/26/2014   CLINICAL DATA:  81 year old with left lower quadrant pain for 2-3 months. Pain radiates to the back. Evaluate for diverticulitis. Patient complains of increased pain.  EXAM: CT  ABDOMEN AND PELVIS WITH CONTRAST  TECHNIQUE: Multidetector CT imaging of the abdomen and pelvis was performed using the standard protocol following bolus administration of intravenous contrast.  CONTRAST:  100 mL Omnipaque 300  COMPARISON:  11/21/2014 and had 03/13/2014  FINDINGS: Chronic densities in the left lower lobe. There is a large amount of free air in the abdomen. In addition, there are innumerable gas bubbles throughout the abdominal fat. Again noted is a large gas filled structure involving the sigmoid colon. This structure now measures 8.3 x 7.2 x 5.3 cm and previously measured 7.4 x 8.3 x 4.4 cm. There are decreased inflammatory changes surrounding this gas-filled structure and sigmoid colon. There is oral contrast throughout the small and large bowel including the rectal region.  Again noted are multiple low-density structures throughout the liver which are most compatible with hepatic cysts, largest in the left hepatic lobe measuring up to 4.3 cm. No acute abnormality to the gallbladder. The portal venous system is patent. Fat infiltration of the pancreas. Normal appearance of the spleen. Normal appearance of the adrenal glands. Stable nodule along the posterior right hemidiaphragm measures 1.8 x 1.0 cm and minimally changed since 2015. Stable low-density cyst in left kidney. Evidence for additional small renal cysts. No acute abnormality to the kidneys.  The abdominal aorta and visceral arteries are heavily calcified. No evidence for an aortic aneurysm.  Patient has penile implants with a fluid reservoir in the left hemipelvis. There prostate seed implants. Urinary bladder is decompressed. There is no significant free fluid or lymphadenopathy within the abdomen or pelvis.  Right hip replacement is located. No acute  bone abnormality. Multilevel degenerative disc disease in lumbar spine.  IMPRESSION: Large amount of free air throughout the abdomen compatible with a perforated viscus. The source of the  bowel perforation is likely from the sigmoid colon and the large gas-filled collection. The large gas-filled collection involves the sigmoid colon and slightly increased in size. The inflammatory changes surrounding the sigmoid colon have decreased.  No focal fluid collection or free fluid.  Numerous hepatic and renal cysts.  Critical Value/emergent results were called by telephone at the time of interpretation on 11/26/2014 at 3:46 pm to Dr. Jacquelynn Cree , who verbally acknowledged these results.   Electronically Signed   By: Markus Daft M.D.   On: 11/26/2014 15:54   Ct Abdomen Pelvis W Contrast  11/21/2014   CLINICAL DATA:  81 year old male with 2-3 day history of lower abdominal pain, distention and constipation. Past history of diverticulitis and prostate cancer.  EXAM: CT ABDOMEN AND PELVIS WITH CONTRAST  TECHNIQUE: Multidetector CT imaging of the abdomen and pelvis was performed using the standard protocol following bolus administration of intravenous contrast.  CONTRAST:  128mL OMNIPAQUE IOHEXOL 300 MG/ML  SOLN  COMPARISON:  Prior CT abdomen/ pelvis 03/13/2014  FINDINGS: Lower Chest: Dependent atelectasis in the lower lobes. The lungs are otherwise clear. Visualized cardiac structures within normal limits for size. No pericardial effusion. Sequelae of prior CABG and pacemaker placement. Unremarkable visualized distal thoracic esophagus.  Abdomen: Unremarkable CT appearance of the stomach, duodenum, spleen, adrenal glands. Diffuse fatty atrophy of the pancreas. No mass or inflammation. Multiple circumscribed low-attenuation lesions scattered throughout the liver the largest of which measures 4.4 cm and is water attenuation consistent with a simple cyst. Many of the other lesions are too small for accurate characterization but remain unchanged in size, number and configuration. These almost certainly represent additional small cysts or biliary hamartomas. It is stable to slightly decreased size of a soft  tissue nodule posterior to the right liver along the peritoneal surface which measures 17 x 8 mm compared to 19 x 9 mm previously. While nonspecific in appearance, this abnormality is remains essentially unchanged dating back to 2011. Greater than 5 year stability is consistent with benignity. Gallbladder is unremarkable. No intra or extrahepatic biliary ductal dilatation.  Unremarkable appearance of the bilateral kidneys. No focal solid lesion, hydronephrosis or nephrolithiasis. 2.9 cm simple cyst in the left lower pole. Additional tiny low-attenuation lesions are too small for characterization but statistically likely also benign cysts.  Extensive sigmoid colonic diverticula. There is focal inflammation of the proximal sigmoid colon consistent with acute diverticulitis. A large air collection is present within the anterior colonic wall measuring 8.3 x 4.4 cm. This likely represents a contained micro perforation. No fluid component to definitively suggest abscess.  Just inferior to the inflamed portion of colon lies the reservoir for the patient's penile prosthesis. This prosthesis may be extraperitoneal. No evidence of small bowel obstruction. Normal appendix in the right lower quadrant. No free fluid or suspicious adenopathy.  Pelvis: Left lower quadrant penile prosthesis. Multiple brachytherapy seeds project over the bladder. No suspicious adenopathy or free fluid.  Bones/Soft Tissues: No acute fracture or aggressive appearing lytic or blastic osseous lesion. Right hip arthroplasty prosthesis. Lower lumbar degenerative disc disease.  Vascular: Atherosclerotic vascular disease without significant stenosis or aneurysmal dilatation.  IMPRESSION: 1. Sigmoid colonic diverticulitis with large intramural air collection within the anterior wall of the affected sigmoid segment concerning for contained micro perforation. No fluid component to suggest a drainable abscess. 2.  Additional ancillary findings as above without  significant interval change.   Electronically Signed   By: Jacqulynn Cadet M.D.   On: 11/21/2014 21:00     Medical Consultants:    Surgery   Anti-Infectives:    Zosyn 11/21/14--->  Subjective:   ARSHAWN VALDEZ seems to be a bit better today, walking in the halls, pain improved.  No N/V.     Objective:    Filed Vitals:   11/26/14 0930 11/26/14 1340 11/26/14 2108 11/27/14 0428  BP: 110/48 125/56 141/62 143/50  Pulse: 71 75 73 63  Temp: 97.8 F (36.6 C)  97.8 F (36.6 C) 97.6 F (36.4 C)  TempSrc: Oral  Oral Oral  Resp: 16 20 18 18   Height:      Weight:    94.756 kg (208 lb 14.4 oz)  SpO2: 96% 98% 92% 97%    Intake/Output Summary (Last 24 hours) at 11/27/14 0804 Last data filed at 11/26/14 1650  Gross per 24 hour  Intake   1410 ml  Output    275 ml  Net   1135 ml    Exam: Gen:  NAD Cardiovascular:  RRR, No M/R/G Respiratory:  Lungs clear Gastrointestinal:  Abdomen soft, less tender in the periumbilical area, + BS Extremities:  No C/E/C   Data Reviewed:    Labs: Basic Metabolic Panel:  Recent Labs Lab 11/22/14 0450 11/24/14 0439 11/25/14 0518 11/26/14 0515 11/27/14 0459  NA 133* 132* 131* 133* 133*  K 4.8 3.8 3.9 4.0 4.2  CL 99* 99* 99* 100* 101  CO2 24 24 28 27 25   GLUCOSE 149* 125* 113* 119* 105*  BUN 22* 20 17 15 13   CREATININE 0.92 1.07 1.06 1.05 0.90  CALCIUM 10.8* 10.3 10.6* 11.0* 10.5*  MG 1.9  --   --   --   --   PHOS 3.2  --   --   --   --    GFR Estimated Creatinine Clearance: 64.7 mL/min (by C-G formula based on Cr of 0.9). Liver Function Tests:  Recent Labs Lab 11/21/14 1700 11/22/14 0450  AST 26 28  ALT 23 20  ALKPHOS 78 66  BILITOT 1.2 1.2  PROT 7.5 7.3  ALBUMIN 4.4 4.0    Recent Labs Lab 11/21/14 1700  LIPASE <10*   CBC:  Recent Labs Lab 11/21/14 1700 11/22/14 0450 11/23/14 0002 11/24/14 0018 11/25/14 0518 11/26/14 0515  WBC 20.1* 17.9* 18.6* 13.4* 12.6* 11.8*  NEUTROABS 18.2*  --   --   --   --    --   HGB 13.0 12.2* 12.4* 13.3 13.8 13.1  HCT 39.7 36.8* 37.5* 39.1 41.9 40.0  MCV 97.8 94.8 96.4 94.2 97.7 95.9  PLT 223 192 233 222 257 243   Cardiac Enzymes:  Recent Labs Lab 11/24/14 0439 11/24/14 1031 11/24/14 1625  TROPONINI <0.03 <0.03 <0.03   BNP (last 3 results)  Recent Labs  02/23/14 1232  PROBNP 35.0   CBG:  Recent Labs Lab 11/26/14 1650 11/26/14 1957 11/27/14 0022 11/27/14 0426 11/27/14 0729  GLUCAP 166* 104* 98 99 94   Sepsis Labs:  Recent Labs Lab 11/21/14 1903 11/21/14 2306  11/23/14 0002 11/24/14 0018 11/25/14 0518 11/26/14 0515  WBC  --   --   < > 18.6* 13.4* 12.6* 11.8*  LATICACIDVEN 1.26 1.2  --   --   --   --   --   < > = values in this interval not displayed. Microbiology No results found  for this or any previous visit (from the past 240 hour(s)).   Medications:   . ertapenem  1 g Intravenous Q24H  . insulin aspart  0-9 Units Subcutaneous 6 times per day  . simethicone  80 mg Oral QID  . sodium chloride  3 mL Intravenous Q12H   Continuous Infusions: . 0.9 % NaCl with KCl 20 mEq / L 75 mL/hr at 11/26/14 2259    Time spent: 25 minutes.   LOS: 6 days   [facility] Pager [phone]. If unable to reach me by pager, please call my cell phone at [phone].  *Please refer to [email or web address], password [email or web address] to get updated schedule on who will round on this patient, as hospitalists switch teams weekly. If 7PM-7AM, please contact night-coverage at [email or web address], password [email or web address] for any overnight needs.  11/27/2014, 8:04 AM

## 2014-11-27 NOTE — Progress Notes (Signed)
Patient ID: Eric [name], male   DOB: 10-02-1926, 81 y.o.   MRN: [id number]    Subjective: Pt feels fairly good today.  Pain controlled and less.  Having some diarrhea  Objective: Vital signs in last 24 hours: Temp:  [97.6 F (36.4 C)-97.8 F (36.6 C)] 97.6 F (36.4 C) (06/21 0428) Pulse Rate:  [63-75] 63 (06/21 0428) Resp:  [18-20] 18 (06/21 0428) BP: (125-143)/(50-62) 143/50 mmHg (06/21 0428) SpO2:  [92 %-98 %] 97 % (06/21 0428) Weight:  [94.756 kg (208 lb 14.4 oz)] 94.756 kg (208 lb 14.4 oz) (06/21 0428) Last BM Date: 11/26/14  Intake/Output from previous day: 06/20 0701 - 06/21 0700 In: 1410 [P.O.:1410] Out: 275 [Urine:275] Intake/Output this shift:    PE: Abd: soft, mildly tender in mid abdomen mostly, +BS, mild bloating,  Heart: regular Lungs: CTAB  Lab Results:   Recent Labs  11/25/14 0518 11/26/14 0515  WBC 12.6* 11.8*  HGB 13.8 13.1  HCT 41.9 40.0  PLT 257 243   BMET  Recent Labs  11/26/14 0515 11/27/14 0459  NA 133* 133*  K 4.0 4.2  CL 100* 101  CO2 27 25  GLUCOSE 119* 105*  BUN 15 13  CREATININE 1.05 0.90  CALCIUM 11.0* 10.5*   PT/INR No results for input(s): LABPROT, INR in the last 72 hours. CMP     Component Value Date/Time   NA 133* 11/27/2014 0459   NA 135* 04/11/2014   K 4.2 11/27/2014 0459   CL 101 11/27/2014 0459   CO2 25 11/27/2014 0459   GLUCOSE 105* 11/27/2014 0459   BUN 13 11/27/2014 0459   BUN 11 04/11/2014   CREATININE 0.90 11/27/2014 0459   CREATININE 1.0 04/11/2014   CALCIUM 10.5* 11/27/2014 0459   PROT 7.3 11/22/2014 0450   ALBUMIN 4.0 11/22/2014 0450   AST 28 11/22/2014 0450   ALT 20 11/22/2014 0450   ALKPHOS 66 11/22/2014 0450   BILITOT 1.2 11/22/2014 0450   GFRNONAA >60 11/27/2014 0459   GFRAA >60 11/27/2014 0459   Lipase     Component Value Date/Time   LIPASE <10* 11/21/2014 1700       Studies/Results: Ct Abdomen Pelvis W Contrast  11/26/2014   CLINICAL DATA:  81 year old with left lower  quadrant pain for 2-3 months. Pain radiates to the back. Evaluate for diverticulitis. Patient complains of increased pain.  EXAM: CT ABDOMEN AND PELVIS WITH CONTRAST  TECHNIQUE: Multidetector CT imaging of the abdomen and pelvis was performed using the standard protocol following bolus administration of intravenous contrast.  CONTRAST:  100 mL Omnipaque 300  COMPARISON:  11/21/2014 and had 03/13/2014  FINDINGS: Chronic densities in the left lower lobe. There is a large amount of free air in the abdomen. In addition, there are innumerable gas bubbles throughout the abdominal fat. Again noted is a large gas filled structure involving the sigmoid colon. This structure now measures 8.3 x 7.2 x 5.3 cm and previously measured 7.4 x 8.3 x 4.4 cm. There are decreased inflammatory changes surrounding this gas-filled structure and sigmoid colon. There is oral contrast throughout the small and large bowel including the rectal region.  Again noted are multiple low-density structures throughout the liver which are most compatible with hepatic cysts, largest in the left hepatic lobe measuring up to 4.3 cm. No acute abnormality to the gallbladder. The portal venous system is patent. Fat infiltration of the pancreas. Normal appearance of the spleen. Normal appearance of the adrenal glands. Stable nodule along the posterior right  hemidiaphragm measures 1.8 x 1.0 cm and minimally changed since 2015. Stable low-density cyst in left kidney. Evidence for additional small renal cysts. No acute abnormality to the kidneys.  The abdominal aorta and visceral arteries are heavily calcified. No evidence for an aortic aneurysm.  Patient has penile implants with a fluid reservoir in the left hemipelvis. There prostate seed implants. Urinary bladder is decompressed. There is no significant free fluid or lymphadenopathy within the abdomen or pelvis.  Right hip replacement is located. No acute bone abnormality. Multilevel degenerative disc disease  in lumbar spine.  IMPRESSION: Large amount of free air throughout the abdomen compatible with a perforated viscus. The source of the bowel perforation is likely from the sigmoid colon and the large gas-filled collection. The large gas-filled collection involves the sigmoid colon and slightly increased in size. The inflammatory changes surrounding the sigmoid colon have decreased.  No focal fluid collection or free fluid.  Numerous hepatic and renal cysts.  Critical Value/emergent results were called by telephone at the time of interpretation on 11/26/2014 at 3:46 pm to Dr. Jacquelynn Cree , who verbally acknowledged these results.   Electronically Signed   By: Markus Daft M.D.   On: 11/26/2014 15:54    Anti-infectives: Anti-infectives    Start     Dose/Rate Route Frequency Ordered Stop   11/26/14 1730  ertapenem (INVANZ) 1 g in sodium chloride 0.9 % 50 mL IVPB     1 g 100 mL/hr over 30 Minutes Intravenous Every 24 hours 11/26/14 1636     11/21/14 2330  piperacillin-tazobactam (ZOSYN) IVPB 3.375 g  Status:  Discontinued     3.375 g 12.5 mL/hr over 240 Minutes Intravenous 3 times per day 11/21/14 2325 11/26/14 1636   11/21/14 2130  piperacillin-tazobactam (ZOSYN) IVPB 3.375 g     3.375 g 12.5 mL/hr over 240 Minutes Intravenous  Once 11/21/14 2127 11/22/14 0150   11/21/14 2115  metroNIDAZOLE (FLAGYL) IVPB 500 mg  Status:  Discontinued     500 mg 100 mL/hr over 60 Minutes Intravenous  Once 11/21/14 2109 11/21/14 2127       Assessment/Plan   1. Diverticulitis with microperforation -CT scan shows increasing free air.  Patient was given options for surgery, but did not want to pursue that.  He would like to try conservatively management still since he is overall feeling better.  abx changed to Invanz. -cont IV zosyn D6 (DC 6-20) Invanz D1 -cont NPO for now. -check labs in AM -follow closely 2. Hard of hearing  3. COPD 4 HTN   LOS: 6 days    Pheonix Clinkscale E 11/27/2014, 11:34 AM Pager:  [phone]

## 2014-11-28 ENCOUNTER — Encounter (HOSPITAL_COMMUNITY): Payer: Self-pay | Admitting: Certified Registered"

## 2014-11-28 ENCOUNTER — Encounter (HOSPITAL_COMMUNITY)
Admission: EM | Disposition: A | Discharge status: Skilled Nursing Facility | Payer: Self-pay | Source: Home / Self Care | Attending: Internal Medicine

## 2014-11-28 ENCOUNTER — Inpatient Hospital Stay (HOSPITAL_COMMUNITY): Payer: Medicare Other | Admitting: Anesthesiology

## 2014-11-28 ENCOUNTER — Inpatient Hospital Stay (HOSPITAL_COMMUNITY): Payer: Medicare Other

## 2014-11-28 DIAGNOSIS — K5720 Diverticulitis of large intestine with perforation and abscess without bleeding: Principal | ICD-10-CM

## 2014-11-28 DIAGNOSIS — Z950 Presence of cardiac pacemaker: Secondary | ICD-10-CM

## 2014-11-28 DIAGNOSIS — E119 Type 2 diabetes mellitus without complications: Secondary | ICD-10-CM

## 2014-11-28 DIAGNOSIS — I10 Essential (primary) hypertension: Secondary | ICD-10-CM

## 2014-11-28 DIAGNOSIS — K5732 Diverticulitis of large intestine without perforation or abscess without bleeding: Secondary | ICD-10-CM

## 2014-11-28 DIAGNOSIS — I25810 Atherosclerosis of coronary artery bypass graft(s) without angina pectoris: Secondary | ICD-10-CM

## 2014-11-28 HISTORY — PX: COLON RESECTION: SHX5231

## 2014-11-28 LAB — GLUCOSE, CAPILLARY
GLUCOSE-CAPILLARY: 87 mg/dL (ref 65–99)
Glucose-Capillary: 90 mg/dL (ref 65–99)
Glucose-Capillary: 84 mg/dL (ref 65–99)
Glucose-Capillary: 91 mg/dL (ref 65–99)
Glucose-Capillary: 116 mg/dL — ABNORMAL HIGH (ref 65–99)

## 2014-11-28 LAB — CBC
HCT: 38.1 % — ABNORMAL LOW (ref 39.0–52.0)
Hemoglobin: 12.3 g/dL — ABNORMAL LOW (ref 13.0–17.0)
MCH: 31.8 pg (ref 26.0–34.0)
MCHC: 32.3 g/dL (ref 30.0–36.0)
MCV: 98.4 fL (ref 78.0–100.0)
Platelets: 230 10*3/uL (ref 150–400)
RBC: 3.87 MIL/uL — ABNORMAL LOW (ref 4.22–5.81)
RDW: 12.7 % (ref 11.5–15.5)
WBC: 12.6 10*3/uL — AB (ref 4.0–10.5)

## 2014-11-28 LAB — SURGICAL PCR SCREEN
MRSA, PCR: NEGATIVE
STAPHYLOCOCCUS AUREUS: NEGATIVE

## 2014-11-28 SURGERY — COLON RESECTION
Anesthesia: General

## 2014-11-28 MED ORDER — LIDOCAINE HCL (CARDIAC) 20 MG/ML IV SOLN
INTRAVENOUS | Status: AC
  Filled 2014-11-28: qty 5

## 2014-11-28 MED ORDER — FENTANYL CITRATE (PF) 100 MCG/2ML IJ SOLN
INTRAMUSCULAR | Status: AC
  Filled 2014-11-28: qty 2

## 2014-11-28 MED ORDER — PROPOFOL 10 MG/ML IV BOLUS
INTRAVENOUS | Status: DC | PRN
  Administered 2014-11-28: 100 mg via INTRAVENOUS

## 2014-11-28 MED ORDER — GLYCOPYRROLATE 0.2 MG/ML IJ SOLN
INTRAMUSCULAR | Status: DC | PRN
  Administered 2014-11-28: 0.6 mg via INTRAVENOUS

## 2014-11-28 MED ORDER — FENTANYL CITRATE (PF) 100 MCG/2ML IJ SOLN
INTRAMUSCULAR | Status: DC
Start: 2014-11-28 — End: 2014-11-28
  Filled 2014-11-28: qty 2

## 2014-11-28 MED ORDER — HYDROMORPHONE HCL 1 MG/ML IJ SOLN
INTRAMUSCULAR | Status: AC
  Filled 2014-11-28: qty 1

## 2014-11-28 MED ORDER — LACTATED RINGERS IV SOLN
INTRAVENOUS | Status: DC | PRN
  Administered 2014-11-28: 14:00:00 via INTRAVENOUS

## 2014-11-28 MED ORDER — ROCURONIUM BROMIDE 100 MG/10ML IV SOLN
INTRAVENOUS | Status: DC | PRN
Start: 2014-11-28 — End: 2014-11-28
  Administered 2014-11-28: 40 mg via INTRAVENOUS

## 2014-11-28 MED ORDER — FENTANYL CITRATE (PF) 100 MCG/2ML IJ SOLN: 25.0000 ug | INTRAMUSCULAR | Status: DC | PRN

## 2014-11-28 MED ORDER — LACTATED RINGERS IV SOLN
INTRAVENOUS | Status: DC
  Administered 2014-11-28: 14:00:00 via INTRAVENOUS

## 2014-11-28 MED ORDER — PROPOFOL 10 MG/ML IV BOLUS
INTRAVENOUS | Status: AC
  Filled 2014-11-28: qty 20

## 2014-11-28 MED ORDER — HYDROMORPHONE HCL 1 MG/ML IJ SOLN
0.2500 mg | INTRAMUSCULAR | Status: DC | PRN
  Administered 2014-11-28 (×2): 0.5 mg via INTRAVENOUS
  Administered 2014-11-28 (×2): 0.25 mg via INTRAVENOUS
  Administered 2014-11-28: 0.5 mg via INTRAVENOUS

## 2014-11-28 MED ORDER — PHENYLEPHRINE HCL 10 MG/ML IJ SOLN
INTRAMUSCULAR | Status: DC | PRN
  Administered 2014-11-28 (×2): 80 ug via INTRAVENOUS

## 2014-11-28 MED ORDER — ONDANSETRON HCL 4 MG/2ML IJ SOLN
INTRAMUSCULAR | Status: AC
  Filled 2014-11-28: qty 2

## 2014-11-28 MED ORDER — FENTANYL CITRATE (PF) 100 MCG/2ML IJ SOLN
INTRAMUSCULAR | Status: DC | PRN
  Administered 2014-11-28 (×4): 25 ug via INTRAVENOUS

## 2014-11-28 MED ORDER — PANTOPRAZOLE SODIUM 40 MG IV SOLR
40.0000 mg | INTRAVENOUS | Status: DC
  Administered 2014-11-28 – 2014-12-03 (×6): 40 mg via INTRAVENOUS
  Filled 2014-11-28 (×7): qty 40

## 2014-11-28 MED ORDER — NEOSTIGMINE METHYLSULFATE 10 MG/10ML IV SOLN
INTRAVENOUS | Status: DC | PRN
  Administered 2014-11-28: 5 mg via INTRAVENOUS

## 2014-11-28 MED ORDER — MUPIROCIN 2 % EX OINT
TOPICAL_OINTMENT | CUTANEOUS | Status: AC
  Administered 2014-11-28: 11:00:00
  Filled 2014-11-28: qty 22

## 2014-11-28 MED ORDER — LIDOCAINE HCL (CARDIAC) 20 MG/ML IV SOLN
INTRAVENOUS | Status: DC | PRN
  Administered 2014-11-28: 100 mg via INTRAVENOUS

## 2014-11-28 MED ORDER — ROCURONIUM BROMIDE 100 MG/10ML IV SOLN
INTRAVENOUS | Status: AC
  Filled 2014-11-28: qty 1

## 2014-11-28 MED ORDER — HYDROMORPHONE HCL 1 MG/ML IJ SOLN
0.5000 mg | INTRAMUSCULAR | Status: DC | PRN
  Administered 2014-11-28 – 2014-11-29 (×11): 1 mg via INTRAVENOUS
  Filled 2014-11-28 (×11): qty 1

## 2014-11-28 MED ORDER — ONDANSETRON HCL 4 MG/2ML IJ SOLN
INTRAMUSCULAR | Status: DC | PRN
  Administered 2014-11-28: 4 mg via INTRAVENOUS

## 2014-11-28 MED ORDER — ONDANSETRON HCL 4 MG/2ML IJ SOLN
4.0000 mg | INTRAMUSCULAR | Status: DC | PRN
  Administered 2014-11-30: 4 mg via INTRAVENOUS
  Filled 2014-11-28: qty 2

## 2014-11-28 MED ORDER — POTASSIUM CHLORIDE IN NACL 20-0.9 MEQ/L-% IV SOLN
INTRAVENOUS | Status: DC
  Administered 2014-11-28 – 2014-11-30 (×4): via INTRAVENOUS
  Filled 2014-11-28 (×4): qty 1000

## 2014-11-28 MED ORDER — DEXAMETHASONE SODIUM PHOSPHATE 10 MG/ML IJ SOLN
INTRAMUSCULAR | Status: DC | PRN
Start: 2014-11-28 — End: 2014-11-28
  Administered 2014-11-28: 10 mg via INTRAVENOUS

## 2014-11-28 MED ORDER — DEXAMETHASONE SODIUM PHOSPHATE 10 MG/ML IJ SOLN
INTRAMUSCULAR | Status: AC
  Filled 2014-11-28: qty 1

## 2014-11-28 MED ORDER — SUCCINYLCHOLINE CHLORIDE 20 MG/ML IJ SOLN
INTRAMUSCULAR | Status: DC | PRN
  Administered 2014-11-28: 100 mg via INTRAVENOUS

## 2014-11-28 MED ORDER — PHENYLEPHRINE 40 MCG/ML (10ML) SYRINGE FOR IV PUSH (FOR BLOOD PRESSURE SUPPORT)
PREFILLED_SYRINGE | INTRAVENOUS | Status: AC
  Filled 2014-11-28: qty 10

## 2014-11-28 MED ORDER — 0.9 % SODIUM CHLORIDE (POUR BTL) OPTIME
TOPICAL | Status: DC | PRN
  Administered 2014-11-28: 2000 mL

## 2014-11-28 SURGICAL SUPPLY — 75 items
APPLICATOR COTTON TIP 6IN STRL (MISCELLANEOUS) ×6 IMPLANT
APPLIER CLIP 5 13 M/L LIGAMAX5 (MISCELLANEOUS)
APPLIER CLIP ROT 10 11.4 M/L (STAPLE)
BLADE EXTENDED COATED 6.5IN (ELECTRODE) ×6 IMPLANT
BLADE HEX COATED 2.75 (ELECTRODE) ×3 IMPLANT
BNDG GAUZE ELAST 4 BULKY (GAUZE/BANDAGES/DRESSINGS) ×3 IMPLANT
CABLE HIGH FREQUENCY MONO STRZ (ELECTRODE) IMPLANT
CELLS DAT CNTRL 66122 CELL SVR (MISCELLANEOUS) IMPLANT
CLIP APPLIE 5 13 M/L LIGAMAX5 (MISCELLANEOUS) IMPLANT
CLIP APPLIE ROT 10 11.4 M/L (STAPLE) IMPLANT
CLIP TI LARGE 6 (CLIP) ×3 IMPLANT
DECANTER SPIKE VIAL GLASS SM (MISCELLANEOUS) IMPLANT
DISSECTOR BLUNT TIP ENDO 5MM (MISCELLANEOUS) IMPLANT
DRAIN CHANNEL RND F F (WOUND CARE) IMPLANT
DRAPE INCISE IOBAN 66X45 STRL (DRAPES) IMPLANT
DRAPE LAPAROSCOPIC ABDOMINAL (DRAPES) ×3 IMPLANT
DRAPE UTILITY XL STRL (DRAPES) ×3 IMPLANT
ELECT REM PT RETURN 9FT ADLT (ELECTROSURGICAL) ×3
ELECTRODE REM PT RTRN 9FT ADLT (ELECTROSURGICAL) ×1 IMPLANT
EVACUATOR SILICONE 100CC (DRAIN) IMPLANT
FILTER SMOKE EVAC LAPAROSHD (FILTER) IMPLANT
GAUZE SPONGE 4X4 12PLY STRL (GAUZE/BANDAGES/DRESSINGS) ×3 IMPLANT
GLOVE BIOGEL PI IND STRL 7.0 (GLOVE) ×1 IMPLANT
GLOVE BIOGEL PI INDICATOR 7.0 (GLOVE) ×2
GLOVE ECLIPSE 8.0 STRL XLNG CF (GLOVE) ×6 IMPLANT
GLOVE INDICATOR 8.0 STRL GRN (GLOVE) ×3 IMPLANT
GOWN STRL REUS W/TWL LRG LVL3 (GOWN DISPOSABLE) ×3 IMPLANT
GOWN STRL REUS W/TWL XL LVL3 (GOWN DISPOSABLE) ×6 IMPLANT
LEGGING LITHOTOMY PAIR STRL (DRAPES) IMPLANT
LIGASURE IMPACT 36 18CM CVD LR (INSTRUMENTS) ×6 IMPLANT
NS IRRIG 1000ML POUR BTL (IV SOLUTION) ×6 IMPLANT
PACK COLON (CUSTOM PROCEDURE TRAY) ×3 IMPLANT
PACK GENERAL/GYN (CUSTOM PROCEDURE TRAY) ×3 IMPLANT
PAD ABD 8X10 STRL (GAUZE/BANDAGES/DRESSINGS) ×3 IMPLANT
RELOAD PROXIMATE 75MM BLUE (ENDOMECHANICALS) ×3 IMPLANT
RTRCTR WOUND ALEXIS 18CM MED (MISCELLANEOUS)
SCISSORS LAP 5X35 DISP (ENDOMECHANICALS) IMPLANT
SEALER TISSUE X1 CVD JAW (INSTRUMENTS) IMPLANT
SET IRRIG TUBING LAPAROSCOPIC (IRRIGATION / IRRIGATOR) IMPLANT
SHEARS HARMONIC ACE PLUS 36CM (ENDOMECHANICALS) IMPLANT
SLEEVE XCEL OPT CAN 5 100 (ENDOMECHANICALS) IMPLANT
SOLUTION ANTI FOG 6CC (MISCELLANEOUS) IMPLANT
SPONGE LAP 18X18 X RAY DECT (DISPOSABLE) ×3 IMPLANT
STAPLER CUT CVD 40MM BLUE (STAPLE) ×3 IMPLANT
STAPLER PROXIMATE 75MM BLUE (STAPLE) ×6 IMPLANT
STAPLER VISISTAT 35W (STAPLE) ×3 IMPLANT
SUT NOV 1 T60/GS (SUTURE) IMPLANT
SUT NOVA NAB DX-16 0-1 5-0 T12 (SUTURE) IMPLANT
SUT NOVA T20/GS 25 (SUTURE) IMPLANT
SUT PDS AB 1 CTX 36 (SUTURE) ×6 IMPLANT
SUT PDS AB 1 TP1 96 (SUTURE) ×3 IMPLANT
SUT PROLENE 2 0 SH DA (SUTURE) ×3 IMPLANT
SUT SILK 2 0 (SUTURE) ×2
SUT SILK 2 0 SH CR/8 (SUTURE) IMPLANT
SUT SILK 2 0SH CR/8 30 (SUTURE) ×3 IMPLANT
SUT SILK 2-0 18XBRD TIE 12 (SUTURE) ×1 IMPLANT
SUT SILK 3 0 (SUTURE) ×2
SUT SILK 3 0 SH CR/8 (SUTURE) ×3 IMPLANT
SUT SILK 3-0 18XBRD TIE 12 (SUTURE) ×1 IMPLANT
SUT VIC AB 2-0 SH 18 (SUTURE) ×6 IMPLANT
SUT VIC AB 3-0 SH 18 (SUTURE) ×6 IMPLANT
SUT VICRYL 2 0 18  UND BR (SUTURE) ×2
SUT VICRYL 2 0 18 UND BR (SUTURE) ×1 IMPLANT
SYS LAPSCP GELPORT 120MM (MISCELLANEOUS)
SYSTEM LAPSCP GELPORT 120MM (MISCELLANEOUS) IMPLANT
TAPE CLOTH SURG 6X10 WHT LF (GAUZE/BANDAGES/DRESSINGS) ×3 IMPLANT
TRAY FOLEY METER SIL LF 16FR (CATHETERS) ×3 IMPLANT
TRAY FOLEY W/METER SILVER 14FR (SET/KITS/TRAYS/PACK) IMPLANT
TROCAR BLADELESS OPT 5 100 (ENDOMECHANICALS) IMPLANT
TROCAR BLADELESS OPT 5 75 (ENDOMECHANICALS) IMPLANT
TROCAR XCEL BLUNT TIP 100MML (ENDOMECHANICALS) IMPLANT
TROCAR XCEL NON-BLD 11X100MML (ENDOMECHANICALS) IMPLANT
TROCAR XCEL UNIV SLVE 11M 100M (ENDOMECHANICALS) IMPLANT
TUBING INSUFFLATION 10FT LAP (TUBING) IMPLANT
YANKAUER SUCT BULB TIP NO VENT (SUCTIONS) ×3 IMPLANT

## 2014-11-28 NOTE — Transfer of Care (Signed)
Immediate Anesthesia Transfer of Care Note  Patient: Eric [name]  Procedure(s) Performed: Procedure(s): EXPLORATORY LAPAROTOMY, SIGMOID COLECTOMY WITH COLOSTOMY (N/A)  Patient Location: PACU  Anesthesia Type:General  Level of Consciousness: sedated  Airway & Oxygen Therapy: Patient Spontanous Breathing and Patient connected to face mask oxygen  Post-op Assessment: Report given to RN and Post -op Vital signs reviewed and stable  Post vital signs: Reviewed and stable  Last Vitals:  Filed Vitals:   11/28/14 1256  BP: 132/48  Pulse:   Temp: 36.9 C  Resp: 18    Complications: No apparent anesthesia complications

## 2014-11-28 NOTE — Anesthesia Postprocedure Evaluation (Signed)
  Anesthesia Post-op Note  Patient: Eric [name]  Procedure(s) Performed: Procedure(s): EXPLORATORY LAPAROTOMY, SIGMOID COLECTOMY WITH COLOSTOMY (N/A)  Patient Location: PACU  Anesthesia Type:General  Level of Consciousness: awake  Airway and Oxygen Therapy: Patient Spontanous Breathing  Post-op Pain: mild  Post-op Assessment: Post-op Vital signs reviewed              Post-op Vital Signs: Reviewed  Last Vitals:  Filed Vitals:   11/28/14 1800  BP:   Pulse: 68  Temp:   Resp: 11    Complications: No apparent anesthesia complications

## 2014-11-28 NOTE — Consult Note (Signed)
WOC requested for preoperative stoma site marking  Discussed surgical procedure and stoma creation with patient.  Explained role of the [facility] nurse team.  Provided the patient with educational booklet/DVD and provided samples of pouching options.  Answered patient questions. Pt is quite frustrated today and in pain, he of course is very concerned about surgery and outcome.  He lives alone and has limited support.   Examined patient lying, sitting, and standing in order to place the marking in the patient's visual field, away from any creases or abdominal contour issues and within the rectus muscle.  Attempted to mark below the patient's belt line.    Marked for colostomy in the LLQ  __6__ cm to the left of the umbilicus and _5__EZ below the umbilicus.  Covered mark with thin film transparent dressing to preserve mark until date of surgery  WOC team will follow up with patient after surgery for continue ostomy care and teaching.  Eric [name] [place] RN,CWOCN [phone]

## 2014-11-28 NOTE — Anesthesia Preprocedure Evaluation (Signed)
Anesthesia Evaluation  Patient identified by MRN, date of birth, ID band Patient awake    Reviewed: Allergy & Precautions, NPO status , Patient's Chart, lab work & pertinent test results  Airway Mallampati: II  TM Distance: >3 FB Neck ROM: Full    Dental   Pulmonary asthma , sleep apnea , former smoker,  breath sounds clear to auscultation        Cardiovascular hypertension, + CAD and + Peripheral Vascular Disease + pacemaker Rhythm:Regular Rate:Normal     Neuro/Psych    GI/Hepatic Neg liver ROS, GERD-  ,  Endo/Other  diabetes  Renal/GU negative Renal ROS     Musculoskeletal   Abdominal   Peds  Hematology   Anesthesia Other Findings   Reproductive/Obstetrics                             Anesthesia Physical Anesthesia Plan  ASA: III  Anesthesia Plan: General   Post-op Pain Management:    Induction: Intravenous  Airway Management Planned: Oral ETT  Additional Equipment:   Intra-op Plan:   Post-operative Plan: Extubation in OR  Informed Consent:   Dental advisory given  Plan Discussed with: CRNA and Anesthesiologist  Anesthesia Plan Comments:         Anesthesia Quick Evaluation

## 2014-11-28 NOTE — Progress Notes (Signed)
He has taken a turn for the worse with more abdominal pain, tenderness and a rising WBC.  I feel he needs an urgent partial colectomy and colostomy today.  I have explained the procedure and risks of colon resection and colostomy to him and his daughter.  Risks include but are not limited to bleeding, infection, wound problems, anesthesia, problems with colostomy, need for reoperative surgery,  injury to intraabominal organs (such as intestine, spleen, kidney, bladder, ureter, etc.), need for postop ventilatory support, death.  They seem to understand and agree to proceed.

## 2014-11-28 NOTE — Care Management Note (Signed)
Case Management Note  Patient Details  Name: RANKIN COOLMAN MRN: [id number] Date of Birth: August 16, 1926  Subjective/Objective:    Pt admitted with severe abd pain, Diverticulitis from home.                Action/Plan:Plan to return home   Expected Discharge Date:                  Expected Discharge Plan:  Home/Self Care  In-House Referral:     Discharge planning Services  CM Consult  Post Acute Care Choice:    Choice offered to:     DME Arranged:    DME Agency:     HH Arranged:    HH Agency:     Status of Service:  In process, will continue to follow  Medicare Important Message Given:    Date Medicare IM Given:    Medicare IM give by:    Date Additional Medicare IM Given:    Additional Medicare Important Message give by:     If discussed at [place] of Stay Meetings, dates discussed:    Additional Comments:Pt will need surgery before discharge.   Purcell Mouton, RN 11/28/2014, 11:21 AM

## 2014-11-28 NOTE — Progress Notes (Signed)
Patient ID: Eric [name], male   DOB: [birth year], 81 y.o.   MRN: [id number]    Subjective: Pt acutely worse this morning.  Had a BM and began having more acute abdominal pain.  Objective: Vital signs in last 24 hours: Temp:  [98 F (36.7 C)-98.6 F (37 C)] 98.6 F (37 C) (06/22 0503) Pulse Rate:  [68-78] 68 (06/22 0503) Resp:  [18-20] 18 (06/22 0503) BP: (130-140)/(58-64) 140/64 mmHg (06/22 0503) SpO2:  [98 %] 98 % (06/22 0503) Weight:  [96 kg (211 lb 10.3 oz)] 96 kg (211 lb 10.3 oz) (06/22 0653) Last BM Date: 11/27/14  Intake/Output from previous day: 06/21 0701 - 06/22 0700 In: 2791.3 [P.O.:540; I.V.:2251.3] Out: -  Intake/Output this shift:    PE: Abd: soft, but much more tender today than previously, voluntary guarding, hypoactive BS Heart: regular Lungs: rhonchi on R>L  Lab Results:   Recent Labs  11/26/14 0515 11/28/14 0542  WBC 11.8* 12.6*  HGB 13.1 12.3*  HCT 40.0 38.1*  PLT 243 230   BMET  Recent Labs  11/26/14 0515 11/27/14 0459  NA 133* 133*  K 4.0 4.2  CL 100* 101  CO2 27 25  GLUCOSE 119* 105*  BUN 15 13  CREATININE 1.05 0.90  CALCIUM 11.0* 10.5*   PT/INR No results for input(s): LABPROT, INR in the last 72 hours. CMP     Component Value Date/Time   NA 133* 11/27/2014 0459   NA 135* 04/11/2014   K 4.2 11/27/2014 0459   CL 101 11/27/2014 0459   CO2 25 11/27/2014 0459   GLUCOSE 105* 11/27/2014 0459   BUN 13 11/27/2014 0459   BUN 11 04/11/2014   CREATININE 0.90 11/27/2014 0459   CREATININE 1.0 04/11/2014   CALCIUM 10.5* 11/27/2014 0459   PROT 7.3 11/22/2014 0450   ALBUMIN 4.0 11/22/2014 0450   AST 28 11/22/2014 0450   ALT 20 11/22/2014 0450   ALKPHOS 66 11/22/2014 0450   BILITOT 1.2 11/22/2014 0450   GFRNONAA >60 11/27/2014 0459   GFRAA >60 11/27/2014 0459   Lipase     Component Value Date/Time   LIPASE <10* 11/21/2014 1700       Studies/Results: Ct Abdomen Pelvis W Contrast  11/26/2014   CLINICAL DATA:   81 year old with left lower quadrant pain for 2-3 months. Pain radiates to the back. Evaluate for diverticulitis. Patient complains of increased pain.  EXAM: CT ABDOMEN AND PELVIS WITH CONTRAST  TECHNIQUE: Multidetector CT imaging of the abdomen and pelvis was performed using the standard protocol following bolus administration of intravenous contrast.  CONTRAST:  100 mL Omnipaque 300  COMPARISON:  11/21/2014 and had 03/13/2014  FINDINGS: Chronic densities in the left lower lobe. There is a large amount of free air in the abdomen. In addition, there are innumerable gas bubbles throughout the abdominal fat. Again noted is a large gas filled structure involving the sigmoid colon. This structure now measures 8.3 x 7.2 x 5.3 cm and previously measured 7.4 x 8.3 x 4.4 cm. There are decreased inflammatory changes surrounding this gas-filled structure and sigmoid colon. There is oral contrast throughout the small and large bowel including the rectal region.  Again noted are multiple low-density structures throughout the liver which are most compatible with hepatic cysts, largest in the left hepatic lobe measuring up to 4.3 cm. No acute abnormality to the gallbladder. The portal venous system is patent. Fat infiltration of the pancreas. Normal appearance of the spleen. Normal appearance of the adrenal glands. Stable  nodule along the posterior right hemidiaphragm measures 1.8 x 1.0 cm and minimally changed since 2015. Stable low-density cyst in left kidney. Evidence for additional small renal cysts. No acute abnormality to the kidneys.  The abdominal aorta and visceral arteries are heavily calcified. No evidence for an aortic aneurysm.  Patient has penile implants with a fluid reservoir in the left hemipelvis. There prostate seed implants. Urinary bladder is decompressed. There is no significant free fluid or lymphadenopathy within the abdomen or pelvis.  Right hip replacement is located. No acute bone abnormality.  Multilevel degenerative disc disease in lumbar spine.  IMPRESSION: Large amount of free air throughout the abdomen compatible with a perforated viscus. The source of the bowel perforation is likely from the sigmoid colon and the large gas-filled collection. The large gas-filled collection involves the sigmoid colon and slightly increased in size. The inflammatory changes surrounding the sigmoid colon have decreased.  No focal fluid collection or free fluid.  Numerous hepatic and renal cysts.  Critical Value/emergent results were called by telephone at the time of interpretation on 11/26/2014 at 3:46 pm to Dr. Jacquelynn Cree , who verbally acknowledged these results.   Electronically Signed   By: Markus Daft M.D.   On: 11/26/2014 15:54    Anti-infectives: Anti-infectives    Start     Dose/Rate Route Frequency Ordered Stop   11/26/14 1730  ertapenem (INVANZ) 1 g in sodium chloride 0.9 % 50 mL IVPB     1 g 100 mL/hr over 30 Minutes Intravenous Every 24 hours 11/26/14 1636     11/21/14 2330  piperacillin-tazobactam (ZOSYN) IVPB 3.375 g  Status:  Discontinued     3.375 g 12.5 mL/hr over 240 Minutes Intravenous 3 times per day 11/21/14 2325 11/26/14 1636   11/21/14 2130  piperacillin-tazobactam (ZOSYN) IVPB 3.375 g     3.375 g 12.5 mL/hr over 240 Minutes Intravenous  Once 11/21/14 2127 11/22/14 0150   11/21/14 2115  metroNIDAZOLE (FLAGYL) IVPB 500 mg  Status:  Discontinued     500 mg 100 mL/hr over 60 Minutes Intravenous  Once 11/21/14 2109 11/21/14 2127       Assessment/Plan   1. Diverticulitis with microperforation -patient acutely worsened today after a BM.  He clearly now has worsening pain.  The patient will need to urgently go to the OR for colectomy/colostomy.  This has been d/w him as well as his daughter Eric [name], on the phone with Dr. Zella Richer.  They are all agreeable to proceed. -will have [place] mark him for his colostomy -cont IV zosyn D6 (DC 6-20) Invanz D2 -cont NPO. -check labs in  AM -follow closely 2. Hard of hearing  3. COPD -more rhonchi today on right side.  Suspect may be PNA or fluid overload as opposed to COPD exacerbation.  This may put his at higher risk though for post op intubation. 4 HTN  5. CAD, s/p CABG   LOS: 7 days    Chaundra Abreu E 11/28/2014, 10:24 AM Pager: [phone]

## 2014-11-28 NOTE — Op Note (Signed)
Operative Note  Eric [name] male 81 y.o. 11/28/2014  PREOPERATIVE DX:  Perforated sigmoid diverticulitis  POSTOPERATIVE DX:  Same  PROCEDURE:   Exploratory laparotomy, sigmoid colectomy, descending colostomy.         Surgeon: Odis Hollingshead   Assistants: Stark Klein, MD  Anesthesia: General endotracheal anesthesia  Indications:   This is an 81 year old male who is admitted with sigmoid diverticulitis. He was started on medical management and has not progressed well. This morning, he got more ill with increasing white count, pain, and tenderness on exam. He is now brought to the operating room urgently for the above procedure.    Procedure Detail:  He was brought to the operating room and placed supine on the operating table and general anesthetic was given. A Foley catheter was inserted. The hair in the abdominal wall was clipped. He had previously been marked for the colostomy site in the left lower quadrant. The abdominal wall was widely sterilely prepped and draped.  A midline incision was made beginning above the umbilicus and extending to the pubis dividing the skin, subcutaneous tissues, fascia, and peritoneum. Once entering the peritoneal cavity a rush of air was evacuated. The abdomen was explored. In the lower midline area there is a firm inflammatory mass involving the sigmoid colon with a small hole presently leaking a small amount of pus as well as air. This was adherent to the small bowel mesentery and was able to be separated bluntly and using electrocautery. There is not a pelvic abscess. There is not a lot of purulent peritonitis present in the abdomen. This was a fairly focal process.  Once the area of abnormal sigmoid colon had been mobilized, I divided the distal descending colon with a linear cutting stapler. I then divided the mesentery close to the colon with the LigaSure. I then went just below the rectosigmoid junction and divided the colon here with a  linear cutting stapler. The rectal stump was marked with 2-0 Prolene sutures left long. I further mobilized the descending colon by dividing its lateral attachments. This allowed for adequate length for a colostomy to come out the left lower quadrant abdominal wall.  The abdominal cavity was then copiously irrigated. Following this, a circular incision was made in the left lower quadrant skin and subcutaneous tissue where the colostomy had been planned. The anterior and posterior fascia was then incised making a cruciate incision and the tract dilated to 2 fingerbreadths. The descending colon stapled off stump was brought up through the circular incision and anchored to the fascia with interrupted 2-0 Vicryl sutures in 4 quadrants.  Next, the midline fascia was closed with #1 running double-stranded looped PDS suture.  The midline wound was then covered. I then matured the colostomy with interrupted 3-0 Vicryl sutures and placed an appliance on it. The midline was then packed with saline moistened Kerlix. A bulky dressing was applied.  He tolerated the procedures well without any apparent complications and was taken to the recovery room in satisfactory condition.   Estimated Blood Loss:  200 mL          Specimens: Sigmoid colon        Complications:  * No complications entered in OR log *         Disposition: PACU - hemodynamically stable.         Condition: stable

## 2014-11-28 NOTE — Anesthesia Procedure Notes (Signed)
Procedure Name: Intubation Date/Time: 11/28/2014 2:09 PM Performed by: Lind Covert Pre-anesthesia Checklist: Patient identified, Timeout performed, Emergency Drugs available, Suction available and Patient being monitored Patient Re-evaluated:Patient Re-evaluated prior to inductionOxygen Delivery Method: Circle system utilized Preoxygenation: Pre-oxygenation with 100% oxygen Intubation Type: IV induction Laryngoscope Size: Mac and 4 Grade View: Grade I Tube type: Oral Tube size: 7.5 mm Number of attempts: 1 Airway Equipment and Method: Stylet Placement Confirmation: ETT inserted through vocal cords under direct vision,  breath sounds checked- equal and bilateral and positive ETCO2 Secured at: 22 cm Tube secured with: Tape Dental Injury: Teeth and Oropharynx as per pre-operative assessment

## 2014-11-28 NOTE — Progress Notes (Signed)
[facility] PROGRESS NOTE  Eric [name] LPF:[id number] DOB: [birth year] DOA: 11/21/2014 PCP: [name], MD  Assessment/Plan:  Diverticulitis of colon with perforation / leukocytosis / abdominal pain - Has been on Zosyn, antibiotics changed to Invanz by surgery 11/26/14. - Repeat CT scan 11/26/14 showed worsening free air, surgery aware of findings. - Pt had been refusing surgery thus far - Symptoms have worsened and pt has since agreed to surgery. Pt is now s/p exlap with sigmoid colectomy and descending colostomy   Hyponatremia / Grade I diastolic dysfunction - Mild, remaining stable thus far   Type 2 diabetes mellitus, controlled without complications - Z3G about 7 months ago was 6.9 indicating good glycemic control. - Continue sliding scale insulin   Dyslipidemia - Would resume statin therapy once patient once diet advanced.   Essential hypertension - Currently hydralazine IV as needed for blood pressure control if needed.   Coronary atherosclerosis - Aspirin on hold until patient tolerates regular diet.   DVT Prophylaxis  - SCD's bilaterally in hospital.  Code Status: Full Family Communication: Pt in room (indicate person spoken with, relationship, and if by phone, the number) Disposition Plan: Pending   Consultants:  General Surgery  Procedures:  Exlap with sigmoid colectomy and descending colostomy 6/22  Antibiotics:  Zosyn 6/15>>>  HPI/Subjective: Complains of abd pain with movement.  Objective: Filed Vitals:   11/28/14 1700 11/28/14 1705 11/28/14 1730 11/28/14 1800  BP: 149/68 165/61 165/45   Pulse: 65 69 68 68  Temp: 98.4 F (36.9 C) 99 F (37.2 C)    TempSrc:  Axillary    Resp: 16 14 12 11   Height:  5\' 6"  (1.676 m)    Weight:  96.7 kg (213 lb 3 oz)    SpO2: 100% 100% 99% 97%    Intake/Output Summary (Last 24 hours) at 11/28/14 1932 Last data filed at 11/28/14 1800  Gross per 24 hour  Intake 4591.25 ml  Output    140  ml  Net 4451.25 ml   Filed Weights   11/27/14 0428 11/28/14 0653 11/28/14 1705  Weight: 94.756 kg (208 lb 14.4 oz) 96 kg (211 lb 10.3 oz) 96.7 kg (213 lb 3 oz)    Exam:   General:  Awake, in nad  Cardiovascular: regular, s1, s2  Respiratory: normal resp effort, no wheezing  Abdomen: soft, marked generalized tenderness  Musculoskeletal: perfused, no clubbing   Data Reviewed: Basic Metabolic Panel:  Recent Labs Lab 11/22/14 0450 11/24/14 0439 11/25/14 0518 11/26/14 0515 11/27/14 0459  NA 133* 132* 131* 133* 133*  K 4.8 3.8 3.9 4.0 4.2  CL 99* 99* 99* 100* 101  CO2 24 24 28 27 25   GLUCOSE 149* 125* 113* 119* 105*  BUN 22* 20 17 15 13   CREATININE 0.92 1.07 1.06 1.05 0.90  CALCIUM 10.8* 10.3 10.6* 11.0* 10.5*  MG 1.9  --   --   --   --   PHOS 3.2  --   --   --   --    Liver Function Tests:  Recent Labs Lab 11/22/14 0450  AST 28  ALT 20  ALKPHOS 66  BILITOT 1.2  PROT 7.3  ALBUMIN 4.0   No results for input(s): LIPASE, AMYLASE in the last 168 hours. No results for input(s): AMMONIA in the last 168 hours. CBC:  Recent Labs Lab 11/23/14 0002 11/24/14 0018 11/25/14 0518 11/26/14 0515 11/28/14 0542  WBC 18.6* 13.4* 12.6* 11.8* 12.6*  HGB 12.4* 13.3 13.8 13.1 12.3*  HCT  37.5* 39.1 41.9 40.0 38.1*  MCV 96.4 94.2 97.7 95.9 98.4  PLT 233 222 257 243 230   Cardiac Enzymes:  Recent Labs Lab 11/24/14 0439 11/24/14 1031 11/24/14 1625  TROPONINI <0.03 <0.03 <0.03   BNP (last 3 results) No results for input(s): BNP in the last 8760 hours.  ProBNP (last 3 results)  Recent Labs  02/23/14 1232  PROBNP 35.0    CBG:  Recent Labs Lab 11/27/14 2342 11/28/14 0453 11/28/14 0810 11/28/14 1122 11/28/14 1616  GLUCAP 92 87 90 84 91    Recent Results (from the past 240 hour(s))  Surgical pcr screen     Status: None   Collection Time: 11/28/14 10:00 AM  Result Value Ref Range Status   MRSA, PCR NEGATIVE NEGATIVE Final   Staphylococcus aureus  NEGATIVE NEGATIVE Final    Comment:        The Xpert SA Assay (FDA approved for NASAL specimens in patients over 53 years of age), is one component of a comprehensive surveillance program.  Test performance has been validated by [facility] for patients greater than or equal to 15 year old. It is not intended to diagnose infection nor to guide or monitor treatment.      Studies: Dg Chest Port 1 View  11/28/2014   CLINICAL DATA:  Cough, shortness of breath for 2-3 days.  Pneumonia.  EXAM: PORTABLE CHEST - 1 VIEW  COMPARISON:  07/26/2014  FINDINGS: Left pacer remains in place, unchanged. Prior CABG. Bibasilar densities are stable compatible with scarring. Heart is normal size. No acute airspace opacities or effusions. No acute bony abnormality.  IMPRESSION: Bibasilar scarring.  No active disease.   Electronically Signed   By: Rolm Baptise M.D.   On: 11/28/2014 10:51    Scheduled Meds: . ertapenem  1 g Intravenous Q24H  . HYDROmorphone      . HYDROmorphone      . insulin aspart  0-9 Units Subcutaneous 6 times per day  . pantoprazole (PROTONIX) IV  40 mg Intravenous Q24H  . sodium chloride  3 mL Intravenous Q12H   Continuous Infusions: . 0.9 % NaCl with KCl 20 mEq / L 100 mL/hr at 11/28/14 1705    Principal Problem:   Diverticulitis of colon with perforation Active Problems:   Type 2 diabetes mellitus, controlled   Dyslipidemia   Essential hypertension   Coronary atherosclerosis   GERD   Pacemaker   GERD (gastroesophageal reflux disease)   Leukocytosis   Hyponatremia   Diastolic dysfunction, Grade 1   CAD (coronary artery disease) of artery bypass graft   [facility], [place] Pager [phone]. If 7PM-7AM, please contact night-coverage at [email or web address], password [facility] 11/28/2014, 7:32 PM  LOS: 7 days

## 2014-11-29 ENCOUNTER — Encounter (HOSPITAL_COMMUNITY): Payer: Self-pay | Admitting: General Surgery

## 2014-11-29 DIAGNOSIS — D72829 Elevated white blood cell count, unspecified: Secondary | ICD-10-CM

## 2014-11-29 DIAGNOSIS — E871 Hypo-osmolality and hyponatremia: Secondary | ICD-10-CM

## 2014-11-29 DIAGNOSIS — I519 Heart disease, unspecified: Secondary | ICD-10-CM

## 2014-11-29 LAB — CBC
HEMATOCRIT: 37.4 % — AB (ref 39.0–52.0)
HEMOGLOBIN: 12.3 g/dL — AB (ref 13.0–17.0)
MCH: 32.8 pg (ref 26.0–34.0)
MCHC: 32.9 g/dL (ref 30.0–36.0)
MCV: 99.7 fL (ref 78.0–100.0)
Platelets: 262 10*3/uL (ref 150–400)
RBC: 3.75 MIL/uL — ABNORMAL LOW (ref 4.22–5.81)
RDW: 12.8 % (ref 11.5–15.5)
WBC: 31.8 10*3/uL — ABNORMAL HIGH (ref 4.0–10.5)

## 2014-11-29 LAB — GLUCOSE, CAPILLARY
GLUCOSE-CAPILLARY: 120 mg/dL — AB (ref 65–99)
GLUCOSE-CAPILLARY: 106 mg/dL — AB (ref 65–99)
GLUCOSE-CAPILLARY: 73 mg/dL (ref 65–99)
Glucose-Capillary: 105 mg/dL — ABNORMAL HIGH (ref 65–99)
Glucose-Capillary: 123 mg/dL — ABNORMAL HIGH (ref 65–99)
Glucose-Capillary: 92 mg/dL (ref 65–99)

## 2014-11-29 MED ORDER — MORPHINE SULFATE 2 MG/ML IJ SOLN
1.0000 mg | INTRAMUSCULAR | Status: DC | PRN
  Administered 2014-11-29 – 2014-12-03 (×17): 1 mg via INTRAVENOUS
  Filled 2014-11-29 (×17): qty 1

## 2014-11-29 NOTE — Progress Notes (Signed)
1 Day Post-Op  Subjective: Cannot hear well without hear aid.  Incision pain  Objective: Vital signs in last 24 hours: Temp:  [97.4 F (36.3 C)-99 F (37.2 C)] 97.8 F (36.6 C) (06/23 0800) Pulse Rate:  [62-82] 71 (06/23 0800) Resp:  [8-18] 10 (06/23 0800) BP: (128-167)/(31-73) 138/39 mmHg (06/23 0800) SpO2:  [93 %-100 %] 98 % (06/23 0800) Weight:  [96.7 kg (213 lb 3 oz)-97 kg (213 lb 13.5 oz)] 97 kg (213 lb 13.5 oz) (06/23 0457) Last BM Date: 11/27/14  Intake/Output from previous day: 06/22 0701 - 06/23 0700 In: 2850 [I.V.:2800; IV Piggyback:50] Out: 675 [Urine:675] Intake/Output this shift:    PE: General- In NAD Abdomen-slight firm and distended, dressing dry, no bowel sounds, colostomy edematous and viable with a small amount of bloody drainage.  Lab Results:   Recent Labs  11/28/14 0542 11/29/14 0350  WBC 12.6* 31.8*  HGB 12.3* 12.3*  HCT 38.1* 37.4*  PLT 230 262   BMET  Recent Labs  11/27/14 0459  NA 133*  K 4.2  CL 101  CO2 25  GLUCOSE 105*  BUN 13  CREATININE 0.90  CALCIUM 10.5*   PT/INR No results for input(s): LABPROT, INR in the last 72 hours. Comprehensive Metabolic Panel:    Component Value Date/Time   NA 133* 11/27/2014 0459   NA 133* 11/26/2014 0515   NA 135* 04/11/2014   NA 138 04/12/2013   K 4.2 11/27/2014 0459   K 4.0 11/26/2014 0515   CL 101 11/27/2014 0459   CL 100* 11/26/2014 0515   CO2 25 11/27/2014 0459   CO2 27 11/26/2014 0515   BUN 13 11/27/2014 0459   BUN 15 11/26/2014 0515   BUN 11 04/11/2014   BUN 17 04/12/2013   CREATININE 0.90 11/27/2014 0459   CREATININE 1.05 11/26/2014 0515   CREATININE 1.0 04/11/2014   CREATININE 0.9 04/12/2013   GLUCOSE 105* 11/27/2014 0459   GLUCOSE 119* 11/26/2014 0515   CALCIUM 10.5* 11/27/2014 0459   CALCIUM 11.0* 11/26/2014 0515   AST 28 11/22/2014 0450   AST 26 11/21/2014 1700   ALT 20 11/22/2014 0450   ALT 23 11/21/2014 1700   ALKPHOS 66 11/22/2014 0450   ALKPHOS 78  11/21/2014 1700   BILITOT 1.2 11/22/2014 0450   BILITOT 1.2 11/21/2014 1700   PROT 7.3 11/22/2014 0450   PROT 7.5 11/21/2014 1700   ALBUMIN 4.0 11/22/2014 0450   ALBUMIN 4.4 11/21/2014 1700     Studies/Results: Dg Chest Port 1 View  11/28/2014   CLINICAL DATA:  Cough, shortness of breath for 2-3 days.  Pneumonia.  EXAM: PORTABLE CHEST - 1 VIEW  COMPARISON:  07/26/2014  FINDINGS: Left pacer remains in place, unchanged. Prior CABG. Bibasilar densities are stable compatible with scarring. Heart is normal size. No acute airspace opacities or effusions. No acute bony abnormality.  IMPRESSION: Bibasilar scarring.  No active disease.   Electronically Signed   By: Rolm Baptise M.D.   On: 11/28/2014 10:51    Anti-infectives: Anti-infectives    Start     Dose/Rate Route Frequency Ordered Stop   11/26/14 1730  ertapenem (INVANZ) 1 g in sodium chloride 0.9 % 50 mL IVPB     1 g 100 mL/hr over 30 Minutes Intravenous Every 24 hours 11/26/14 1636     11/21/14 2330  piperacillin-tazobactam (ZOSYN) IVPB 3.375 g  Status:  Discontinued     3.375 g 12.5 mL/hr over 240 Minutes Intravenous 3 times per day 11/21/14 2325 11/26/14  1636   11/21/14 2130  piperacillin-tazobactam (ZOSYN) IVPB 3.375 g     3.375 g 12.5 mL/hr over 240 Minutes Intravenous  Once 11/21/14 2127 11/22/14 0150   11/21/14 2115  metroNIDAZOLE (FLAGYL) IVPB 500 mg  Status:  Discontinued     500 mg 100 mL/hr over 60 Minutes Intravenous  Once 11/21/14 2109 11/21/14 2127      Assessment Principal Problem:   Diverticulitis of sigmoid colon with perforation s/p Hartmann procedure 11/28/14-stable overnight; leukocytosis likely due to stress response to surgery. Active Problems:   Type 2 diabetes mellitus, controlled   Essential hypertension   Coronary atherosclerosis   Pacemaker   Diastolic dysfunction, Grade 1   CAD (coronary artery disease) of artery bypass graft    LOS: 8 days   Plan: Begin dressing changes tomorrow.  Wait for  return of bowel function.  IV abxs.   Eric [name] 11/29/2014 ]

## 2014-11-29 NOTE — Progress Notes (Signed)
Date:  November 29, 2014 U.R. performed for needs and level of care. POD 1, sepsis present, low bp s/p surg. Will continue to follow for Case Management needs.  Velva Harman, RN, BSN, [place]   [phone]

## 2014-11-29 NOTE — Progress Notes (Signed)
Pt complaining of hallucinations. "I'm seeing people coming through the window and staring at me." pt thinks this is being caused by pain medication. He is aware they are not real and is currently calm and sleeping off and on. Pt stated he does not want anymore of the pain medication due to these hallucinations. Pt was comforted and MD was notified. Awaiting further orders.

## 2014-11-29 NOTE — Consult Note (Signed)
WOC ostomy consult note Stoma type/location: LLQ Colostomy Stomal assessment/size: 1 and 3/4 inch slightly oval with a downward slant toward the midline. Midline side is flush with abdomen, lateral side is slightly raised. Os at center. Peristomal assessment: intact, clear Treatment options for stomal/peristomal skin: skin barrier ring Output: serosanguinous, small amount. No flatus  Ostomy pouching: 2pc., 2 and 3/4 inch pouching system with skin barrier ring Education provided: Patient understands that he has a colostomy and that the pouches are emptied in-between the times he would change it. He is somewhat groggy at the moment but denies pain. Asking questions about the surgery, for example how much intestine was taken and why they could not reconnect in surgery. I have deferred those inquiries to the surgical team. Enrolled patient in [facility] program: No [place] nursing team will follow, and will remain available to this patient, the nursing, surgical and medical teams.   Thanks, Maudie Flakes, MSN, RN, [place], [place], [place] [phone])

## 2014-11-29 NOTE — Progress Notes (Addendum)
[facility] PROGRESS NOTE  ELVAN EBRON STM:[id number] DOB: 11-02-1926 DOA: 11/21/2014 PCP: [name], MD  Assessment/Plan:  Diverticulitis of colon with perforation / leukocytosis / abdominal pain - Has been on Zosyn, antibiotics changed to Invanz by surgery 11/26/14. - Repeat CT scan 11/26/14 showed worsening free air, surgery aware of findings. - Pt had been refusing surgery early during this admission, however ultimately underwent exlap with sigmoid colectomy and descending colostomy on 6/22 after symptoms worsened - Pt reports pain is improved, but still has residual post-op pain - WBC elevated to 31k today. Continued on invanz post-operatively. Will continue   Hyponatremia / Grade I diastolic dysfunction - Mild, remaining stable thus far   Type 2 diabetes mellitus, controlled without complications - G9Q about 7 months ago was 6.9 indicating good glycemic control. - Will continue sliding scale insulin   Dyslipidemia - Would resume statin therapy once patient once diet advanced.   Essential hypertension - For now, cont hydralazine IV as needed for blood pressure.   Coronary atherosclerosis - Aspirin on hold until patient tolerates regular diet.   DVT Prophylaxis  - SCD's bilaterally in hospital.  Code Status: Full Family Communication: Pt in room Disposition Plan: Pending   Consultants:  General Surgery  Procedures:  Exlap with sigmoid colectomy and descending colostomy 6/22  Antibiotics:  Zosyn 6/15>>>  HPI/Subjective: Complains of generalized ab pain post-operatively  Objective: Filed Vitals:   11/29/14 1000 11/29/14 1100 11/29/14 1200 11/29/14 1600  BP:  147/56 141/47   Pulse: 73 71 74   Temp:   98.3 F (36.8 C) 97.2 F (36.2 C)  TempSrc:   Oral Oral  Resp: 13 13 14    Height:      Weight:      SpO2: 100% 99% 99%     Intake/Output Summary (Last 24 hours) at 11/29/14 1645 Last data filed at 11/29/14 0400  Gross per 24  hour  Intake   1250 ml  Output    575 ml  Net    675 ml   Filed Weights   11/28/14 0653 11/28/14 1705 11/29/14 0457  Weight: 96 kg (211 lb 10.3 oz) 96.7 kg (213 lb 3 oz) 97 kg (213 lb 13.5 oz)    Exam:   General:  Asleep, arousable, in nad  Cardiovascular: regular, s1, s2  Respiratory: normal resp effort, no wheezing  Abdomen: soft, decreased BS, generalized tenderness  Musculoskeletal: perfused, no clubbing   Data Reviewed: Basic Metabolic Panel:  Recent Labs Lab 11/24/14 0439 11/25/14 0518 11/26/14 0515 11/27/14 0459  NA 132* 131* 133* 133*  K 3.8 3.9 4.0 4.2  CL 99* 99* 100* 101  CO2 24 28 27 25   GLUCOSE 125* 113* 119* 105*  BUN 20 17 15 13   CREATININE 1.07 1.06 1.05 0.90  CALCIUM 10.3 10.6* 11.0* 10.5*   Liver Function Tests: No results for input(s): AST, ALT, ALKPHOS, BILITOT, PROT, ALBUMIN in the last 168 hours. No results for input(s): LIPASE, AMYLASE in the last 168 hours. No results for input(s): AMMONIA in the last 168 hours. CBC:  Recent Labs Lab 11/24/14 0018 11/25/14 0518 11/26/14 0515 11/28/14 0542 11/29/14 0350  WBC 13.4* 12.6* 11.8* 12.6* 31.8*  HGB 13.3 13.8 13.1 12.3* 12.3*  HCT 39.1 41.9 40.0 38.1* 37.4*  MCV 94.2 97.7 95.9 98.4 99.7  PLT 222 257 243 230 262   Cardiac Enzymes:  Recent Labs Lab 11/24/14 0439 11/24/14 1031 11/24/14 1625  TROPONINI <0.03 <0.03 <0.03   BNP (last 3 results)  No results for input(s): BNP in the last 8760 hours.  ProBNP (last 3 results)  Recent Labs  02/23/14 1232  PROBNP 35.0    CBG:  Recent Labs Lab 11/28/14 2348 11/29/14 0349 11/29/14 0828 11/29/14 1146 11/29/14 1627  GLUCAP 123* 120* 106* 105* 92    Recent Results (from the past 240 hour(s))  Surgical pcr screen     Status: None   Collection Time: 11/28/14 10:00 AM  Result Value Ref Range Status   MRSA, PCR NEGATIVE NEGATIVE Final   Staphylococcus aureus NEGATIVE NEGATIVE Final    Comment:        The Xpert SA Assay  (FDA approved for NASAL specimens in patients over 66 years of age), is one component of a comprehensive surveillance program.  Test performance has been validated by [facility] for patients greater than or equal to 79 year old. It is not intended to diagnose infection nor to guide or monitor treatment.      Studies: Dg Chest Port 1 View  11/28/2014   CLINICAL DATA:  Cough, shortness of breath for 2-3 days.  Pneumonia.  EXAM: PORTABLE CHEST - 1 VIEW  COMPARISON:  07/26/2014  FINDINGS: Left pacer remains in place, unchanged. Prior CABG. Bibasilar densities are stable compatible with scarring. Heart is normal size. No acute airspace opacities or effusions. No acute bony abnormality.  IMPRESSION: Bibasilar scarring.  No active disease.   Electronically Signed   By: Rolm Baptise M.D.   On: 11/28/2014 10:51    Scheduled Meds: . ertapenem  1 g Intravenous Q24H  . insulin aspart  0-9 Units Subcutaneous 6 times per day  . pantoprazole (PROTONIX) IV  40 mg Intravenous Q24H  . sodium chloride  3 mL Intravenous Q12H   Continuous Infusions: . 0.9 % NaCl with KCl 20 mEq / L 100 mL/hr at 11/29/14 1342    Principal Problem:   Diverticulitis of colon with perforation Active Problems:   Type 2 diabetes mellitus, controlled   Dyslipidemia   Essential hypertension   Coronary atherosclerosis   GERD   Pacemaker   GERD (gastroesophageal reflux disease)   Leukocytosis   Hyponatremia   Diastolic dysfunction, Grade 1   CAD (coronary artery disease) of artery bypass graft   Rivan Siordia, [place] [facility] Pager [phone]. If 7PM-7AM, please contact night-coverage at [email or web address], password [facility] 11/29/2014, 4:45 PM  LOS: 8 days

## 2014-11-30 LAB — CBC
HCT: 36.4 % — ABNORMAL LOW (ref 39.0–52.0)
HEMOGLOBIN: 11.8 g/dL — AB (ref 13.0–17.0)
MCH: 32.1 pg (ref 26.0–34.0)
MCHC: 32.4 g/dL (ref 30.0–36.0)
MCV: 98.9 fL (ref 78.0–100.0)
PLATELETS: 219 10*3/uL (ref 150–400)
RBC: 3.68 MIL/uL — AB (ref 4.22–5.81)
RDW: 12.7 % (ref 11.5–15.5)
WBC: 18.7 10*3/uL — AB (ref 4.0–10.5)

## 2014-11-30 LAB — GLUCOSE, CAPILLARY
GLUCOSE-CAPILLARY: 94 mg/dL (ref 65–99)
GLUCOSE-CAPILLARY: 130 mg/dL — AB (ref 65–99)
Glucose-Capillary: 126 mg/dL — ABNORMAL HIGH (ref 65–99)
Glucose-Capillary: 132 mg/dL — ABNORMAL HIGH (ref 65–99)
Glucose-Capillary: 69 mg/dL (ref 65–99)
Glucose-Capillary: 78 mg/dL (ref 65–99)
Glucose-Capillary: 83 mg/dL (ref 65–99)

## 2014-11-30 LAB — TROPONIN I
Troponin I: <0.03 ng/mL (ref <0.031)
Troponin I: <0.03 ng/mL (ref <0.031)
Troponin I: <0.03 ng/mL (ref <0.031)

## 2014-11-30 MED ORDER — FAMOTIDINE IN NACL 20-0.9 MG/50ML-% IV SOLN
20.0000 mg | Freq: Once | INTRAVENOUS | Status: AC
  Administered 2014-11-30: 20 mg via INTRAVENOUS
  Filled 2014-11-30: qty 50

## 2014-11-30 MED ORDER — DEXTROSE 5 % IV SOLN
INTRAVENOUS | Status: DC
  Administered 2014-11-30 – 2014-12-03 (×4): via INTRAVENOUS

## 2014-11-30 MED ORDER — ENOXAPARIN SODIUM 40 MG/0.4ML ~~LOC~~ SOLN
40.0000 mg | SUBCUTANEOUS | Status: DC
  Administered 2014-11-30 – 2014-12-04 (×5): 40 mg via SUBCUTANEOUS
  Filled 2014-11-30 (×5): qty 0.4

## 2014-11-30 MED ORDER — ACETAMINOPHEN 10 MG/ML IV SOLN
1000.0000 mg | Freq: Four times a day (QID) | INTRAVENOUS | Status: AC
Start: 2014-11-30 — End: 2014-12-01
  Administered 2014-11-30 – 2014-12-01 (×4): 1000 mg via INTRAVENOUS
  Filled 2014-11-30 (×4): qty 100

## 2014-11-30 MED ORDER — HYDRALAZINE HCL 20 MG/ML IJ SOLN
10.0000 mg | Freq: Four times a day (QID) | INTRAMUSCULAR | Status: DC | PRN
  Administered 2014-11-30: 10 mg via INTRAVENOUS
  Filled 2014-11-30: qty 1

## 2014-11-30 MED ORDER — METHOCARBAMOL 1000 MG/10ML IJ SOLN
500.0000 mg | Freq: Once | INTRAVENOUS | Status: AC
  Administered 2014-11-30: 500 mg via INTRAVENOUS
  Filled 2014-11-30: qty 5

## 2014-11-30 NOTE — Progress Notes (Signed)
Patient ID: Eric [name], male   DOB: 1926/10/26, 81 y.o.   MRN: [id number]     [place]      [address] [place], [place] [address]    Phone: [phone] FAX: [phone]     Subjective: Sore.  Nausea.  Hallucinated with dilaudid.  Says morphine has made him sleepy in the past.  WBC down.  Afebrile.  BP up but stable, no tachycardia.     Objective:  Vital signs:  Filed Vitals:   11/30/14 0400 11/30/14 0500 11/30/14 0600 11/30/14 0800  BP: 153/40 163/45 145/45   Pulse: 74 73 69   Temp: 99.6 F (37.6 C)   98.1 F (36.7 C)  TempSrc: Oral   Oral  Resp: 13 16 14    Height:      Weight: 98.1 kg (216 lb 4.3 oz)     SpO2: 98% 100% 100%     Last BM Date: 11/27/14  Intake/Output   Yesterday:  06/23 0701 - 06/24 0700 In: 2500 [I.V.:2400; IV Piggyback:100] Out: 1350 [Urine:1350] This shift: I/O last 3 completed shifts: In: 3700 [I.V.:3600; IV Piggyback:100] Out: [place] [Urine:1885]    Physical Exam: General: Pt awake/alert/oriented x4 in no acute distress  Abdomen: Soft.  +bs.  Non distended.  Appropriately tender.  Midline wound is open-fascia is intact, clean, repacked.  LLQ stoma is pink and viable with 2 small areas that are purple.  No output or air yet.   No evidence of peritonitis.  No incarcerated hernias.    Problem List:   Principal Problem:   Diverticulitis of colon with perforation Active Problems:   Type 2 diabetes mellitus, controlled   Dyslipidemia   Essential hypertension   Coronary atherosclerosis   GERD   Pacemaker   GERD (gastroesophageal reflux disease)   Leukocytosis   Hyponatremia   Diastolic dysfunction, Grade 1   CAD (coronary artery disease) of artery bypass graft    Results:   Labs: Results for orders placed or performed during the hospital encounter of 11/21/14 (from the past 33 hour(s))  Surgical pcr screen     Status: None   Collection Time: 11/28/14 10:00 AM  Result  Value Ref Range   MRSA, PCR NEGATIVE NEGATIVE   Staphylococcus aureus NEGATIVE NEGATIVE    Comment:        The Xpert SA Assay (FDA approved for NASAL specimens in patients over 25 years of age), is one component of a comprehensive surveillance program.  Test performance has been validated by [facility] for patients greater than or equal to 75 year old. It is not intended to diagnose infection nor to guide or monitor treatment.   Glucose, capillary     Status: None   Collection Time: 11/28/14 11:22 AM  Result Value Ref Range   Glucose-Capillary 84 65 - 99 mg/dL  Glucose, capillary     Status: None   Collection Time: 11/28/14  4:16 PM  Result Value Ref Range   Glucose-Capillary 91 65 - 99 mg/dL   Comment 1 Notify RN    Comment 2 Document in Chart   Glucose, capillary     Status: Abnormal   Collection Time: 11/28/14  7:44 PM  Result Value Ref Range   Glucose-Capillary 116 (H) 65 - 99 mg/dL  Glucose, capillary     Status: Abnormal   Collection Time: 11/28/14 11:48 PM  Result Value Ref Range   Glucose-Capillary 123 (H) 65 - 99 mg/dL  Glucose, capillary  Status: Abnormal   Collection Time: 11/29/14  3:49 AM  Result Value Ref Range   Glucose-Capillary 120 (H) 65 - 99 mg/dL  CBC     Status: Abnormal   Collection Time: 11/29/14  3:50 AM  Result Value Ref Range   WBC 31.8 (H) 4.0 - 10.5 K/uL   RBC 3.75 (L) 4.22 - 5.81 MIL/uL   Hemoglobin 12.3 (L) 13.0 - 17.0 g/dL   HCT 37.4 (L) 39.0 - 52.0 %   MCV 99.7 78.0 - 100.0 fL   MCH 32.8 26.0 - 34.0 pg   MCHC 32.9 30.0 - 36.0 g/dL   RDW 12.8 11.5 - 15.5 %   Platelets 262 150 - 400 K/uL  Glucose, capillary     Status: Abnormal   Collection Time: 11/29/14  8:28 AM  Result Value Ref Range   Glucose-Capillary 106 (H) 65 - 99 mg/dL   Comment 1 Notify RN    Comment 2 Document in Chart   Glucose, capillary     Status: Abnormal   Collection Time: 11/29/14 11:46 AM  Result Value Ref Range   Glucose-Capillary 105 (H) 65 - 99 mg/dL    Comment 1 Notify RN    Comment 2 Document in Chart   Glucose, capillary     Status: None   Collection Time: 11/29/14  4:27 PM  Result Value Ref Range   Glucose-Capillary 92 65 - 99 mg/dL   Comment 1 Notify RN    Comment 2 Document in Chart   Glucose, capillary     Status: None   Collection Time: 11/29/14  8:11 PM  Result Value Ref Range   Glucose-Capillary 73 65 - 99 mg/dL   Comment 1 Notify RN   Glucose, capillary     Status: None   Collection Time: 11/29/14 11:32 PM  Result Value Ref Range   Glucose-Capillary 78 65 - 99 mg/dL   Comment 1 Notify RN   CBC     Status: Abnormal   Collection Time: 11/30/14  2:45 AM  Result Value Ref Range   WBC 18.7 (H) 4.0 - 10.5 K/uL   RBC 3.68 (L) 4.22 - 5.81 MIL/uL   Hemoglobin 11.8 (L) 13.0 - 17.0 g/dL   HCT 36.4 (L) 39.0 - 52.0 %   MCV 98.9 78.0 - 100.0 fL   MCH 32.1 26.0 - 34.0 pg   MCHC 32.4 30.0 - 36.0 g/dL   RDW 12.7 11.5 - 15.5 %   Platelets 219 150 - 400 K/uL  Troponin I (q 6hr x 3)     Status: None   Collection Time: 11/30/14  2:45 AM  Result Value Ref Range   Troponin I <0.03 <0.031 ng/mL    Comment:        NO INDICATION OF MYOCARDIAL INJURY.   Glucose, capillary     Status: None   Collection Time: 11/30/14  4:21 AM  Result Value Ref Range   Glucose-Capillary 83 65 - 99 mg/dL   Comment 1 Notify RN   Glucose, capillary     Status: None   Collection Time: 11/30/14  7:53 AM  Result Value Ref Range   Glucose-Capillary 69 65 - 99 mg/dL   Comment 1 Notify RN    Comment 2 Document in Chart     Imaging / Studies: Dg Chest Port 1 View  11/28/2014   CLINICAL DATA:  Cough, shortness of breath for 2-3 days.  Pneumonia.  EXAM: PORTABLE CHEST - 1 VIEW  COMPARISON:  07/26/2014  FINDINGS:  Left pacer remains in place, unchanged. Prior CABG. Bibasilar densities are stable compatible with scarring. Heart is normal size. No acute airspace opacities or effusions. No acute bony abnormality.  IMPRESSION: Bibasilar scarring.  No active  disease.   Electronically Signed   By: Rolm Baptise M.D.   On: 11/28/2014 10:51    Medications / Allergies:  Scheduled Meds: . ertapenem  1 g Intravenous Q24H  . insulin aspart  0-9 Units Subcutaneous 6 times per day  . pantoprazole (PROTONIX) IV  40 mg Intravenous Q24H  . sodium chloride  3 mL Intravenous Q12H   Continuous Infusions: . dextrose 75 mL/hr at 11/30/14 0825   PRN Meds:.albuterol, hydrALAZINE, morphine injection, ondansetron, polyvinyl alcohol  Antibiotics: Anti-infectives    Start     Dose/Rate Route Frequency Ordered Stop   11/26/14 1730  ertapenem (INVANZ) 1 g in sodium chloride 0.9 % 50 mL IVPB     1 g 100 mL/hr over 30 Minutes Intravenous Every 24 hours 11/26/14 1636     11/21/14 2330  piperacillin-tazobactam (ZOSYN) IVPB 3.375 g  Status:  Discontinued     3.375 g 12.5 mL/hr over 240 Minutes Intravenous 3 times per day 11/21/14 2325 11/26/14 1636   11/21/14 2130  piperacillin-tazobactam (ZOSYN) IVPB 3.375 g     3.375 g 12.5 mL/hr over 240 Minutes Intravenous  Once 11/21/14 2127 11/22/14 0150   11/21/14 2115  metroNIDAZOLE (FLAGYL) IVPB 500 mg  Status:  Discontinued     500 mg 100 mL/hr over 60 Minutes Intravenous  Once 11/21/14 2109 11/21/14 2127        Assessment/Plan Perforated sigmoid diverticulitis POD#2 exploratory laparotomy, sigmoid  Colectomy, descending colostomy---Dr. Brien Mates -stable, awaiting bowel function -WOC consult -start BID wet to dry dressing changes, will d/w Dr. Zella Richer regarding VAC -IS, mobilize, PT eval ID- Invanz D#4/9 for total of 7 days post op FEN-NPO x ice chips, IVF.  Morphine for pain, if unable to tolerate try fentanyl.  Hallucinations with dilaudid.  Try tylenol IV to minimize narcotics given his age, will consider toradol. VTE prophylaxis-SCDs, start lovenox. Dispo-stable for transfer to floor from surgical standpoint.   Erby Pian, [facility] Pager [phone]) For consults  and floor pages call [phone](7A-4:30P)  11/30/2014 8:38 AM

## 2014-11-30 NOTE — Progress Notes (Signed)
11/30/14  Per Dr Zella Richer leave foley in for one more day. Plan to D/C foley tomorrow 6/25.

## 2014-11-30 NOTE — Progress Notes (Signed)
11/30/14  1250  Called Cecille Rubin [phone] spoke with her regarding her coming to start wound vac. Per Cecille Rubin she was no longer at [facility] but at a retreat and the nurses should be able to start wound vac. Also, call Estill Bakes, [place] of surgery to check to see if they mind the floor nurses initiating the wound vac. Per Emina it's okay for the floor Rn to start wound vac since wound care nurse is unavailable.

## 2014-11-30 NOTE — Evaluation (Signed)
Physical Therapy Evaluation Patient Details Name: Eric [name] MRN: [id number] DOB: 1926-11-29 Today's Date: 11/30/2014   History of Present Illness  admitted 11/21/14 forabdominal pain, s/p Exploratory laparotomy, sigmoid colectomy, descending colostomy on 11/28/14  Clinical Impression  Patient c/o back pain, asking if can be related to  The abdominal surgery. Reports that he had "shots" in his back the day he was admitted. Patient very limited by this  C/o pain/ patient will benefit from PT to address problems l;isted in note below.    Follow Up Recommendations SNF;Supervision/Assistance - 24 hour    Equipment Recommendations  None recommended by PT    Recommendations for Other Services       Precautions / Restrictions Precautions Precaution Comments: wound VAC      Mobility  Bed Mobility Overal bed mobility: Needs Assistance Bed Mobility: Rolling;Sidelying to Sit Rolling: Min assist Sidelying to sit: Mod assist       General bed mobility comments: cues for abdominal protection  Transfers Overall transfer level: Needs assistance Equipment used: Rolling walker (2 wheeled) Transfers: Sit to/from [employer] Transfers Sit to Stand: +2 safety/equipment;+2 physical assistance;Mod assist Stand pivot transfers: Mod assist;+2 physical assistance;+2 safety/equipment       General transfer comment: multimodal cues, cues to stand more erect, knees flexed, shuffle steps to turn to move to recliner, extra time, cues for posture.  Ambulation/Gait                Stairs            Wheelchair Mobility    Modified Rankin (Stroke Patients Only)       Balance Overall balance assessment: Needs assistance Sitting-balance support: Feet supported;Bilateral upper extremity supported Sitting balance-Leahy Scale: Poor Sitting balance - Comments: tends to lean forward                                     Pertinent Vitals/Pain Pain Assessment:  0-10 Pain Score: 10-Worst pain ever Pain Location: back and to L side Pain Descriptors / Indicators: Aching;Contraction;Cramping Pain Intervention(s): Monitored during session;Repositioned    Home Living Family/patient expects to be discharged to:: Private residence Living Arrangements: Alone   Type of Home: Apartment         Home Equipment: Cane - single point Additional Comments: [name]    Prior Function                 Hand Dominance        Extremity/Trunk Assessment   Upper Extremity Assessment: Generalized weakness           Lower Extremity Assessment: Generalized weakness         Communication      Cognition Arousal/Alertness: Awake/alert Behavior During Therapy: Agitated;Anxious Overall Cognitive Status: Within Functional Limits for tasks assessed                      General Comments      Exercises        Assessment/Plan    PT Assessment Patient needs continued PT services  PT Diagnosis Difficulty walking;Acute pain   PT Problem List Decreased strength;Decreased activity tolerance;Decreased mobility;Decreased knowledge of precautions;Decreased safety awareness;Decreased knowledge of use of DME;Pain  PT Treatment Interventions DME instruction;Gait training;Functional mobility training;Therapeutic activities;Therapeutic exercise;Patient/family education   PT Goals (Current goals can be found in the Care Plan section) Acute Rehab PT Goals Patient Stated Goal:  to walk again PT Goal Formulation: With patient Time For Goal Achievement: 12/14/14 Potential to Achieve Goals: Good    Frequency Min 3X/week   Barriers to discharge Decreased caregiver support      Co-evaluation               End of Session   Activity Tolerance: Patient limited by pain Patient left: in chair;with call bell/phone within reach Nurse Communication: Mobility status         Time: 2542-7062 PT Time Calculation (min)  (ACUTE ONLY): 30 min   Charges:   PT Evaluation $Initial PT Evaluation Tier I: 1 Procedure PT Treatments $Therapeutic Activity: 8-22 mins   PT G Codes:        Marcelino Freestone PT [phone]  11/30/2014, 4:40 PM

## 2014-11-30 NOTE — Progress Notes (Signed)
Pt called out. when call light was answered pt was crying out in pain stating "my chest hurts all over". Pt v/s remain stable. EKG was preformed. Pt stated that the pain is worse with inspiration and does not radiate to jaw or extremities. Pt also states he felt pain in upper abdomen and back. Prn morphine 1 mg dose was given and on-call physician notified. MD ordered troponin level's and robaxin. No further orders at this time.

## 2014-11-30 NOTE — Progress Notes (Signed)
11/30/14  1415  Abdominal wound measured 18" length, 4" width, and 2.25" deep.

## 2014-11-30 NOTE — Progress Notes (Addendum)
[facility] PROGRESS NOTE  Eric [name] NWG:[id number] DOB: 03-02-1927 DOA: 11/21/2014 PCP: [name], MD  Assessment/Plan:  Diverticulitis of colon with perforation / leukocytosis / abdominal pain - Has been on Zosyn, antibiotics changed to Invanz by surgery 11/26/14. - Repeat CT scan 11/26/14 showed worsening free air, surgery aware of findings. - Pt had been refusing surgery early during this admission, however ultimately underwent exlap with sigmoid colectomy and descending colostomy on 6/22 after symptoms worsened - Pt continues with mild abd pain post-operatively. - BS improved today. Cont NPO per General Surgery   Hyponatremia / Grade I diastolic dysfunction - Mild, remaining stable thus far   Type 2 diabetes mellitus, controlled without complications - V7Q about 7 months ago was 6.9 indicating good glycemic control. - Will continue sliding scale insulin   Dyslipidemia - Would resume statin therapy once patient once diet advanced.   Essential hypertension - For now, cont hydralazine IV as needed for blood pressure.   Coronary atherosclerosis - Aspirin on hold until patient tolerates regular diet.   DVT Prophylaxis  - SCD's bilaterally in hospital.  Code Status: Full Family Communication: Pt in room Disposition Plan: Transfer to floor   Consultants:  General Surgery  Procedures:  Exlap with sigmoid colectomy and descending colostomy 6/22  Antibiotics:  Zosyn 6/15>>>6/20  Invanz 6/20>>>  HPI/Subjective: Still notes abd pain  Objective: Filed Vitals:   11/30/14 1100 11/30/14 1200 11/30/14 1300 11/30/14 1317  BP: 155/50 161/59 188/57   Pulse: 77 74 74   Temp:  97.8 F (36.6 C)    TempSrc:  Oral    Resp: 15 18 17    Height:      Weight:      SpO2: 98% 88% 100% 98%    Intake/Output Summary (Last 24 hours) at 11/30/14 1423 Last data filed at 11/30/14 0700  Gross per 24 hour  Intake   2500 ml  Output   1350 ml  Net   1150  ml   Filed Weights   11/28/14 1705 11/29/14 0457 11/30/14 0400  Weight: 96.7 kg (213 lb 3 oz) 97 kg (213 lb 13.5 oz) 98.1 kg (216 lb 4.3 oz)    Exam:   General:  Asleep, easily arousable, in nad  Cardiovascular: regular, s1, s2  Respiratory: normal resp effort, no wheezing  Abdomen: soft, pos BS, generalized tenderness  Musculoskeletal: perfused, no clubbing   Data Reviewed: Basic Metabolic Panel:  Recent Labs Lab 11/24/14 0439 11/25/14 0518 11/26/14 0515 11/27/14 0459  NA 132* 131* 133* 133*  K 3.8 3.9 4.0 4.2  CL 99* 99* 100* 101  CO2 24 28 27 25   GLUCOSE 125* 113* 119* 105*  BUN 20 17 15 13   CREATININE 1.07 1.06 1.05 0.90  CALCIUM 10.3 10.6* 11.0* 10.5*   Liver Function Tests: No results for input(s): AST, ALT, ALKPHOS, BILITOT, PROT, ALBUMIN in the last 168 hours. No results for input(s): LIPASE, AMYLASE in the last 168 hours. No results for input(s): AMMONIA in the last 168 hours. CBC:  Recent Labs Lab 11/25/14 0518 11/26/14 0515 11/28/14 0542 11/29/14 0350 11/30/14 0245  WBC 12.6* 11.8* 12.6* 31.8* 18.7*  HGB 13.8 13.1 12.3* 12.3* 11.8*  HCT 41.9 40.0 38.1* 37.4* 36.4*  MCV 97.7 95.9 98.4 99.7 98.9  PLT 257 243 230 262 219   Cardiac Enzymes:  Recent Labs Lab 11/24/14 0439 11/24/14 1031 11/24/14 1625 11/30/14 0245 11/30/14 0835  TROPONINI <0.03 <0.03 <0.03 <0.03 <0.03   BNP (last 3 results) No results  for input(s): BNP in the last 8760 hours.  ProBNP (last 3 results)  Recent Labs  02/23/14 1232  PROBNP 35.0    CBG:  Recent Labs Lab 11/29/14 2011 11/29/14 2332 11/30/14 0421 11/30/14 0753 11/30/14 1147  GLUCAP 73 78 83 69 94    Recent Results (from the past 240 hour(s))  Surgical pcr screen     Status: None   Collection Time: 11/28/14 10:00 AM  Result Value Ref Range Status   MRSA, PCR NEGATIVE NEGATIVE Final   Staphylococcus aureus NEGATIVE NEGATIVE Final    Comment:        The Xpert SA Assay (FDA approved for  NASAL specimens in patients over 79 years of age), is one component of a comprehensive surveillance program.  Test performance has been validated by [facility] for patients greater than or equal to 72 year old. It is not intended to diagnose infection nor to guide or monitor treatment.      Studies: No results found.  Scheduled Meds: . acetaminophen  1,000 mg Intravenous Q6H  . enoxaparin (LOVENOX) injection  40 mg Subcutaneous Q24H  . ertapenem  1 g Intravenous Q24H  . insulin aspart  0-9 Units Subcutaneous 6 times per day  . pantoprazole (PROTONIX) IV  40 mg Intravenous Q24H  . sodium chloride  3 mL Intravenous Q12H   Continuous Infusions: . dextrose 75 mL/hr at 11/30/14 0825    Principal Problem:   Diverticulitis of colon with perforation Active Problems:   Type 2 diabetes mellitus, controlled   Dyslipidemia   Essential hypertension   Coronary atherosclerosis   GERD   Pacemaker   GERD (gastroesophageal reflux disease)   Leukocytosis   Hyponatremia   Diastolic dysfunction, Grade 1   CAD (coronary artery disease) of artery bypass graft   Curties Conigliaro, [place] [facility] Pager [phone]. If 7PM-7AM, please contact night-coverage at [email or web address], password [facility] 11/30/2014, 2:23 PM  LOS: 9 days

## 2014-12-01 ENCOUNTER — Inpatient Hospital Stay (HOSPITAL_COMMUNITY): Payer: Medicare Other

## 2014-12-01 DIAGNOSIS — G4733 Obstructive sleep apnea (adult) (pediatric): Secondary | ICD-10-CM

## 2014-12-01 LAB — CBC
HEMATOCRIT: 33.1 % — AB (ref 39.0–52.0)
HEMOGLOBIN: 10.9 g/dL — AB (ref 13.0–17.0)
MCH: 32.4 pg (ref 26.0–34.0)
MCHC: 32.9 g/dL (ref 30.0–36.0)
MCV: 98.5 fL (ref 78.0–100.0)
PLATELETS: 205 10*3/uL (ref 150–400)
RBC: 3.36 MIL/uL — ABNORMAL LOW (ref 4.22–5.81)
RDW: 12.8 % (ref 11.5–15.5)
WBC: 11.5 10*3/uL — AB (ref 4.0–10.5)

## 2014-12-01 LAB — BASIC METABOLIC PANEL
Anion gap: 8 (ref 5–15)
BUN: 13 mg/dL (ref 6–20)
CHLORIDE: 103 mmol/L (ref 101–111)
CO2: 25 mmol/L (ref 22–32)
Calcium: 9.5 mg/dL (ref 8.9–10.3)
Creatinine, Ser: 0.74 mg/dL (ref 0.61–1.24)
GFR calc Af Amer: >60 mL/min (ref >60)
GFR calc non Af Amer: >60 mL/min (ref >60)
Glucose, Bld: 131 mg/dL — ABNORMAL HIGH (ref 65–99)
POTASSIUM: 3.7 mmol/L (ref 3.5–5.1)
SODIUM: 136 mmol/L (ref 135–145)

## 2014-12-01 LAB — GLUCOSE, CAPILLARY
GLUCOSE-CAPILLARY: 131 mg/dL — AB (ref 65–99)
Glucose-Capillary: 128 mg/dL — ABNORMAL HIGH (ref 65–99)
Glucose-Capillary: 120 mg/dL — ABNORMAL HIGH (ref 65–99)
Glucose-Capillary: 140 mg/dL — ABNORMAL HIGH (ref 65–99)
Glucose-Capillary: 138 mg/dL — ABNORMAL HIGH (ref 65–99)

## 2014-12-01 NOTE — Progress Notes (Signed)
[facility] PROGRESS NOTE  Eric [name] IWL:[id number] DOB: April 02, 1927 DOA: 11/21/2014 PCP: [name], MD  Assessment/Plan:  Diverticulitis of colon with perforation / leukocytosis / abdominal pain - Has been on Zosyn, antibiotics changed to Invanz by surgery 11/26/14. - Repeat CT scan 11/26/14 showed worsening free air, surgery aware of findings. - Pt had been refusing surgery early during this admission, however ultimately underwent exlap with sigmoid colectomy and descending colostomy on 6/22 after symptoms worsened - Pt continues with mild abd pain post-operatively. - Currently remains NPO per General Surgery   Hyponatremia / Grade I diastolic dysfunction - Mild, remaining stable thus far   Type 2 diabetes mellitus, controlled without complications - R7E about 7 months ago was 6.9 indicating good glycemic control. - Will continue sliding scale insulin   Dyslipidemia - Would resume statin therapy once patient once diet advanced.   Essential hypertension - For now, cont hydralazine IV as needed for blood pressure.   Coronary atherosclerosis - Aspirin on hold until patient tolerates regular diet.   DVT Prophylaxis  - SCD's bilaterally in hospital.  Back Pain - Suspect musculoskeletal from prolonged bedrest - Pt has hx of back pain - heat packs - back xray  Code Status: Full Family Communication: Pt in room Disposition Plan: Transfer to floor   Consultants:  General Surgery  Procedures:  Exlap with sigmoid colectomy and descending colostomy 6/22  Antibiotics:  Zosyn 6/15>>>6/20  Invanz 6/20>>>  HPI/Subjective: Complains of back pain  Objective: Filed Vitals:   11/30/14 2122 12/01/14 0223 12/01/14 0454 12/01/14 1441  BP: 135/57  129/52 146/50  Pulse: 79  69 74  Temp: 97.8 F (36.6 C)  98.2 F (36.8 C) 98.2 F (36.8 C)  TempSrc: Oral  Oral Oral  Resp: 16  18 18   Height:      Weight:   98.5 kg (217 lb 2.5 oz)   SpO2: 96% 99%  98% 98%    Intake/Output Summary (Last 24 hours) at 12/01/14 1506 Last data filed at 12/01/14 1302  Gross per 24 hour  Intake      0 ml  Output   2925 ml  Net  -2925 ml   Filed Weights   11/30/14 0400 11/30/14 1530 12/01/14 0454  Weight: 98.1 kg (216 lb 4.3 oz) 100.8 kg (222 lb 3.6 oz) 98.5 kg (217 lb 2.5 oz)    Exam:   General:  Awake, sitting in chair, in nad  Cardiovascular: regular, s1, s2  Respiratory: normal resp effort, no wheezing  Abdomen: soft, pos BS, generalized tenderness  Musculoskeletal: perfused, no clubbing, no cyanosis  Data Reviewed: Basic Metabolic Panel:  Recent Labs Lab 11/25/14 0518 11/26/14 0515 11/27/14 0459 12/01/14 0530  NA 131* 133* 133* 136  K 3.9 4.0 4.2 3.7  CL 99* 100* 101 103  CO2 28 27 25 25   GLUCOSE 113* 119* 105* 131*  BUN 17 15 13 13   CREATININE 1.06 1.05 0.90 0.74  CALCIUM 10.6* 11.0* 10.5* 9.5   Liver Function Tests: No results for input(s): AST, ALT, ALKPHOS, BILITOT, PROT, ALBUMIN in the last 168 hours. No results for input(s): LIPASE, AMYLASE in the last 168 hours. No results for input(s): AMMONIA in the last 168 hours. CBC:  Recent Labs Lab 11/26/14 0515 11/28/14 0542 11/29/14 0350 11/30/14 0245 12/01/14 0530  WBC 11.8* 12.6* 31.8* 18.7* 11.5*  HGB 13.1 12.3* 12.3* 11.8* 10.9*  HCT 40.0 38.1* 37.4* 36.4* 33.1*  MCV 95.9 98.4 99.7 98.9 98.5  PLT 243 230 262  219 205   Cardiac Enzymes:  Recent Labs Lab 11/24/14 1625 11/30/14 0245 11/30/14 0835 11/30/14 1437  TROPONINI <0.03 <0.03 <0.03 <0.03   BNP (last 3 results) No results for input(s): BNP in the last 8760 hours.  ProBNP (last 3 results)  Recent Labs  02/23/14 1232  PROBNP 35.0    CBG:  Recent Labs Lab 11/30/14 2016 11/30/14 2356 12/01/14 0359 12/01/14 0732 12/01/14 1145  GLUCAP 130* 132* 128* 120* 131*    Recent Results (from the past 240 hour(s))  Surgical pcr screen     Status: None   Collection Time: 11/28/14 10:00 AM   Result Value Ref Range Status   MRSA, PCR NEGATIVE NEGATIVE Final   Staphylococcus aureus NEGATIVE NEGATIVE Final    Comment:        The Xpert SA Assay (FDA approved for NASAL specimens in patients over 42 years of age), is one component of a comprehensive surveillance program.  Test performance has been validated by [facility] for patients greater than or equal to 79 year old. It is not intended to diagnose infection nor to guide or monitor treatment.      Studies: No results found.  Scheduled Meds: . enoxaparin (LOVENOX) injection  40 mg Subcutaneous Q24H  . ertapenem  1 g Intravenous Q24H  . insulin aspart  0-9 Units Subcutaneous 6 times per day  . pantoprazole (PROTONIX) IV  40 mg Intravenous Q24H  . sodium chloride  3 mL Intravenous Q12H   Continuous Infusions: . dextrose 75 mL/hr at 12/01/14 1205    Principal Problem:   Diverticulitis of colon with perforation Active Problems:   Type 2 diabetes mellitus, controlled   Dyslipidemia   Essential hypertension   Coronary atherosclerosis   GERD   Pacemaker   GERD (gastroesophageal reflux disease)   Leukocytosis   Hyponatremia   Diastolic dysfunction, Grade 1   CAD (coronary artery disease) of artery bypass graft   Abel Ra, [place] [facility] Pager [phone]. If 7PM-7AM, please contact night-coverage at [email or web address], password [email or web address] 12/01/2014, 3:06 PM  LOS: 10 days

## 2014-12-01 NOTE — Progress Notes (Signed)
3 Days Post-Op  Subjective: Other than complaining about the care from the staff, he seems to be doing ok. Pain is improving  Objective: Vital signs in last 24 hours: Temp:  [97.8 F (36.6 C)-98.2 F (36.8 C)] 98.2 F (36.8 C) (06/25 0454) Pulse Rate:  [69-82] 69 (06/25 0454) Resp:  [12-19] 18 (06/25 0454) BP: (129-188)/(50-107) 129/52 mmHg (06/25 0454) SpO2:  [88 %-100 %] 98 % (06/25 0454) Weight:  [98.5 kg (217 lb 2.5 oz)-100.8 kg (222 lb 3.6 oz)] 98.5 kg (217 lb 2.5 oz) (06/25 0454) Last BM Date: 11/27/14  Intake/Output from previous day: 06/24 0701 - 06/25 0700 In: -  Out: 1700 [Urine:1700] Intake/Output this shift:    Abdomen soft, ostomy pink, no gas in bag VAC in place  Lab Results:   Recent Labs  11/30/14 0245 12/01/14 0530  WBC 18.7* 11.5*  HGB 11.8* 10.9*  HCT 36.4* 33.1*  PLT 219 205   BMET  Recent Labs  12/01/14 0530  NA 136  K 3.7  CL 103  CO2 25  GLUCOSE 131*  BUN 13  CREATININE 0.74  CALCIUM 9.5   PT/INR No results for input(s): LABPROT, INR in the last 72 hours. ABG No results for input(s): PHART, HCO3 in the last 72 hours.  Invalid input(s): PCO2, PO2  Studies/Results: No results found.  Anti-infectives: Anti-infectives    Start     Dose/Rate Route Frequency Ordered Stop   11/26/14 1730  ertapenem (INVANZ) 1 g in sodium chloride 0.9 % 50 mL IVPB     1 g 100 mL/hr over 30 Minutes Intravenous Every 24 hours 11/26/14 1636     11/21/14 2330  piperacillin-tazobactam (ZOSYN) IVPB 3.375 g  Status:  Discontinued     3.375 g 12.5 mL/hr over 240 Minutes Intravenous 3 times per day 11/21/14 2325 11/26/14 1636   11/21/14 2130  piperacillin-tazobactam (ZOSYN) IVPB 3.375 g     3.375 g 12.5 mL/hr over 240 Minutes Intravenous  Once 11/21/14 2127 11/22/14 0150   11/21/14 2115  metroNIDAZOLE (FLAGYL) IVPB 500 mg  Status:  Discontinued     500 mg 100 mL/hr over 60 Minutes Intravenous  Once 11/21/14 2109 11/21/14 2127       Assessment/Plan: s/p Procedure(s): EXPLORATORY LAPAROTOMY, SIGMOID COLECTOMY WITH COLOSTOMY (N/A)  Continue NPO, VAC, IV antibiotics  LOS: 10 days    Daveyon Kitchings A 12/01/2014

## 2014-12-02 ENCOUNTER — Inpatient Hospital Stay (HOSPITAL_COMMUNITY): Payer: Medicare Other

## 2014-12-02 DIAGNOSIS — K219 Gastro-esophageal reflux disease without esophagitis: Secondary | ICD-10-CM

## 2014-12-02 LAB — GLUCOSE, CAPILLARY
GLUCOSE-CAPILLARY: 113 mg/dL — AB (ref 65–99)
GLUCOSE-CAPILLARY: 115 mg/dL — AB (ref 65–99)
GLUCOSE-CAPILLARY: 128 mg/dL — AB (ref 65–99)
GLUCOSE-CAPILLARY: 129 mg/dL — AB (ref 65–99)
Glucose-Capillary: 140 mg/dL — ABNORMAL HIGH (ref 65–99)

## 2014-12-02 MED ORDER — KETOROLAC TROMETHAMINE 15 MG/ML IJ SOLN
15.0000 mg | Freq: Four times a day (QID) | INTRAMUSCULAR | Status: DC | PRN
  Administered 2014-12-02 – 2014-12-04 (×5): 15 mg via INTRAVENOUS
  Filled 2014-12-02 (×5): qty 1

## 2014-12-02 NOTE — Progress Notes (Signed)
[facility] PROGRESS NOTE  Eric [name] EYC:[id number] DOB: [birth year] DOA: 11/21/2014 PCP: [name], MD  Assessment/Plan:  Diverticulitis of colon with perforation / leukocytosis / abdominal pain - Has been on Zosyn, antibiotics changed to Invanz by surgery 11/26/14. - Repeat CT scan 11/26/14 showed worsening free air, surgery aware of findings. - Pt had been refusing surgery early during this admission, however ultimately underwent exlap with sigmoid colectomy and descending colostomy on 6/22 after symptoms worsened - Pt remains with generalized post-operative pain with post-op ileus. - Currently remains NPO per General Surgery   Hyponatremia / Grade I diastolic dysfunction - Mild, remaining stable thus far   Type 2 diabetes mellitus, controlled without complications - J4H about 7 months ago was 6.9 indicating good glycemic control. - Will continue sliding scale insulin   Dyslipidemia - Plan to resume statin therapy once patient is able to reliably tolerate PO.   Essential hypertension - For now, cont hydralazine IV as needed for blood pressure.   Coronary atherosclerosis - Aspirin remains on hold until patient tolerates regular diet.   DVT Prophylaxis  - SCD's bilaterally in hospital.  Back Pain - Suspect musculoskeletal from prolonged bedrest - Pt has known hx of chronic back pain - lumbar xray is notable for significant degenerative changes. - Have added Ketoralac for added pain control  Code Status: Full Family Communication: Pt in room Disposition Plan: Pending   Consultants:  General Surgery  Procedures:  Exlap with sigmoid colectomy and descending colostomy 6/22  Antibiotics:  Zosyn 6/15>>>6/20  Invanz 6/20>>>  HPI/Subjective: Complains of abd pain this AM  Objective: Filed Vitals:   12/01/14 2133 12/02/14 0006 12/02/14 0520 12/02/14 1430  BP: 126/99  132/56 144/85  Pulse: 81  71 81  Temp: 98.3 F (36.8 C)  97.9 F  (36.6 C) 98.2 F (36.8 C)  TempSrc: Oral  Oral Oral  Resp: 18  18 18   Height:      Weight:   98.3 kg (216 lb 11.4 oz)   SpO2: 96% 98% 97% 98%    Intake/Output Summary (Last 24 hours) at 12/02/14 1603 Last data filed at 12/02/14 1048  Gross per 24 hour  Intake      0 ml  Output    700 ml  Net   -700 ml   Filed Weights   11/30/14 1530 12/01/14 0454 12/02/14 0520  Weight: 100.8 kg (222 lb 3.6 oz) 98.5 kg (217 lb 2.5 oz) 98.3 kg (216 lb 11.4 oz)    Exam:   General:  Awake, laying in bed, in nad  Cardiovascular: regular, s1, s2  Respiratory: normal resp effort, no wheezing  Abdomen: soft, decreased BS, mildly distended, generalized tenderness  Musculoskeletal: perfused, no clubbing, no cyanosis  Data Reviewed: Basic Metabolic Panel:  Recent Labs Lab 11/26/14 0515 11/27/14 0459 12/01/14 0530  NA 133* 133* 136  K 4.0 4.2 3.7  CL 100* 101 103  CO2 27 25 25   GLUCOSE 119* 105* 131*  BUN 15 13 13   CREATININE 1.05 0.90 0.74  CALCIUM 11.0* 10.5* 9.5   Liver Function Tests: No results for input(s): AST, ALT, ALKPHOS, BILITOT, PROT, ALBUMIN in the last 168 hours. No results for input(s): LIPASE, AMYLASE in the last 168 hours. No results for input(s): AMMONIA in the last 168 hours. CBC:  Recent Labs Lab 11/26/14 0515 11/28/14 0542 11/29/14 0350 11/30/14 0245 12/01/14 0530  WBC 11.8* 12.6* 31.8* 18.7* 11.5*  HGB 13.1 12.3* 12.3* 11.8* 10.9*  HCT 40.0 38.1* 37.4*  36.4* 33.1*  MCV 95.9 98.4 99.7 98.9 98.5  PLT 243 230 262 219 205   Cardiac Enzymes:  Recent Labs Lab 11/30/14 0245 11/30/14 0835 11/30/14 1437  TROPONINI <0.03 <0.03 <0.03   BNP (last 3 results) No results for input(s): BNP in the last 8760 hours.  ProBNP (last 3 results)  Recent Labs  02/23/14 1232  PROBNP 35.0    CBG:  Recent Labs Lab 12/01/14 1635 12/01/14 2031 12/02/14 0010 12/02/14 0739 12/02/14 1119  GLUCAP 140* 138* 129* 113* 128*    Recent Results (from the past  240 hour(s))  Surgical pcr screen     Status: None   Collection Time: 11/28/14 10:00 AM  Result Value Ref Range Status   MRSA, PCR NEGATIVE NEGATIVE Final   Staphylococcus aureus NEGATIVE NEGATIVE Final    Comment:        The Xpert SA Assay (FDA approved for NASAL specimens in patients over 63 years of age), is one component of a comprehensive surveillance program.  Test performance has been validated by [facility] for patients greater than or equal to 48 year old. It is not intended to diagnose infection nor to guide or monitor treatment.      Studies: Dg Lumbar Spine 2-3 Views  12/01/2014   CLINICAL DATA:  Current back pain got worse after surgery a couple of days ago. Recent colon resection. Drainage on the left side on.  EXAM: LUMBAR SPINE - 2-3 VIEW  COMPARISON:  11/26/2014 CT  FINDINGS: There is significant degenerative change throughout the lumbar spine. No evidence for acute fracture or traumatic subluxation. There is atherosclerotic calcification of the abdominal aorta. Residual contrast in the bowel after recent CT exam. Patient has had previous right hip arthroplasty. Prostate seeds are noted.  IMPRESSION: 1. Significant degenerative changes. 2.  No evidence for acute  abnormality. 3. Abdominal aorta atherosclerosis.   Electronically Signed   By: Nolon Nations M.D.   On: 12/01/2014 17:10   Dg Chest Port 1 View  12/02/2014   CLINICAL DATA:  Cardiomyopathy.  Sick sinus syndrome.  EXAM: PORTABLE CHEST - 1 VIEW  COMPARISON:  11/28/2014  FINDINGS: There is a left chest wall pacer device. Previous median sternotomy and CABG procedure. Atelectasis is identified within the left base. No pleural effusion or edema.  IMPRESSION: 1. Left base atelectasis.   Electronically Signed   By: Kerby Moors M.D.   On: 12/02/2014 11:22    Scheduled Meds: . enoxaparin (LOVENOX) injection  40 mg Subcutaneous Q24H  . ertapenem  1 g Intravenous Q24H  . insulin aspart  0-9 Units Subcutaneous 6  times per day  . pantoprazole (PROTONIX) IV  40 mg Intravenous Q24H  . sodium chloride  3 mL Intravenous Q12H   Continuous Infusions: . dextrose 75 mL/hr at 12/01/14 1205    Principal Problem:   Diverticulitis of colon with perforation Active Problems:   Type 2 diabetes mellitus, controlled   Dyslipidemia   Essential hypertension   Coronary atherosclerosis   GERD   Pacemaker   GERD (gastroesophageal reflux disease)   Leukocytosis   Hyponatremia   Diastolic dysfunction, Grade 1   CAD (coronary artery disease) of artery bypass graft   CH[facility], [place] [facility] Pager [phone]. If 7PM-7AM, please contact night-coverage at [email or web address], password [email or web address] 12/02/2014, 4:03 PM  LOS: 11 days

## 2014-12-02 NOTE — Progress Notes (Signed)
RT gave patient PRN treatment. Patient was having a hard time coughing up secretions. RT ordered a flutter valve to help patient bring up secretion. The flutter valve was a success and patient brought up thick tan secretion. Patient was very happy.

## 2014-12-02 NOTE — Care Management Note (Addendum)
Case Management Note  Patient Details  Name: Eric [name] MRN: [id number] Date of Birth: 25-Aug-1926  Subjective/Objective:                    Action/Plan: Discharge Planning  Expected Discharge Date:                  Expected Discharge Plan:  Home w/ [facility] (?home health vs. SNF)  In-House Referral:     Discharge planning Services  CM Consult  Post Acute Care Choice:    Choice offered to:     DME Arranged:    DME Agency:     HH Arranged:    HH Agency:     Status of Service:  In process, will continue to follow  Medicare Important Message Given:  Yes Date Medicare IM Given:  11/28/14 Medicare IM give by:  M.McGibboney, RN Date Additional Medicare IM Given:    Additional Medicare Important Message give by:     If discussed at [place] of Stay Meetings, dates discussed:    Additional Comments:  CM spoke with patient at the bedside. He lives alone at [address]. He has used [facility] in the past for home health services. Patient states he had a difficult time getting up to the bathroom today and will require assistance with ADL's at home. CM continues to follow for d/c plan. Apolonio Schneiders, RN 12/02/2014, 12:48 PM

## 2014-12-02 NOTE — Progress Notes (Signed)
Notified Dr. Wyline Copas that pt is passing gas in bag and I was unable to find contact number for surgeon.  Dr. Wyline Copas assisted me in finding telephone number for surgeon, Coralie Keens.  Contacted Dr. Ninfa Linden at [phone] to inform him of pt passing gas in bag.  Dr. Ninfa Linden orders clear liquid diet for pt.

## 2014-12-02 NOTE — Progress Notes (Signed)
Patient passing gas in bag. Emptied gas in bag. Unable to locate telephone or page number for surgeon, Coralie Keens.

## 2014-12-02 NOTE — Progress Notes (Signed)
4 Days Post-Op  Subjective: More comfortable today Denies nausea  Objective: Vital signs in last 24 hours: Temp:  [97.9 F (36.6 C)-98.3 F (36.8 C)] 97.9 F (36.6 C) (06/26 0520) Pulse Rate:  [71-81] 71 (06/26 0520) Resp:  [18] 18 (06/26 0520) BP: (126-146)/(50-99) 132/56 mmHg (06/26 0520) SpO2:  [96 %-98 %] 97 % (06/26 0520) Weight:  [98.3 kg (216 lb 11.4 oz)] 98.3 kg (216 lb 11.4 oz) (06/26 0520) Last BM Date: 11/27/14  Intake/Output from previous day: 06/25 0701 - 06/26 0700 In: -  Out: 1900 [Urine:1900] Intake/Output this shift:    Abdomen mildly full. No gas in bag Ostomy viable  Lab Results:   Recent Labs  11/30/14 0245 12/01/14 0530  WBC 18.7* 11.5*  HGB 11.8* 10.9*  HCT 36.4* 33.1*  PLT 219 205   BMET  Recent Labs  12/01/14 0530  NA 136  K 3.7  CL 103  CO2 25  GLUCOSE 131*  BUN 13  CREATININE 0.74  CALCIUM 9.5   PT/INR No results for input(s): LABPROT, INR in the last 72 hours. ABG No results for input(s): PHART, HCO3 in the last 72 hours.  Invalid input(s): PCO2, PO2  Studies/Results: Dg Lumbar Spine 2-3 Views  12/01/2014   CLINICAL DATA:  Current back pain got worse after surgery a couple of days ago. Recent colon resection. Drainage on the left side on.  EXAM: LUMBAR SPINE - 2-3 VIEW  COMPARISON:  11/26/2014 CT  FINDINGS: There is significant degenerative change throughout the lumbar spine. No evidence for acute fracture or traumatic subluxation. There is atherosclerotic calcification of the abdominal aorta. Residual contrast in the bowel after recent CT exam. Patient has had previous right hip arthroplasty. Prostate seeds are noted.  IMPRESSION: 1. Significant degenerative changes. 2.  No evidence for acute  abnormality. 3. Abdominal aorta atherosclerosis.   Electronically Signed   By: Nolon Nations M.D.   On: 12/01/2014 17:10    Anti-infectives: Anti-infectives    Start     Dose/Rate Route Frequency Ordered Stop   11/26/14 1730   ertapenem (INVANZ) 1 g in sodium chloride 0.9 % 50 mL IVPB     1 g 100 mL/hr over 30 Minutes Intravenous Every 24 hours 11/26/14 1636     11/21/14 2330  piperacillin-tazobactam (ZOSYN) IVPB 3.375 g  Status:  Discontinued     3.375 g 12.5 mL/hr over 240 Minutes Intravenous 3 times per day 11/21/14 2325 11/26/14 1636   11/21/14 2130  piperacillin-tazobactam (ZOSYN) IVPB 3.375 g     3.375 g 12.5 mL/hr over 240 Minutes Intravenous  Once 11/21/14 2127 11/22/14 0150   11/21/14 2115  metroNIDAZOLE (FLAGYL) IVPB 500 mg  Status:  Discontinued     500 mg 100 mL/hr over 60 Minutes Intravenous  Once 11/21/14 2109 11/21/14 2127      Assessment/Plan: s/p Procedure(s): EXPLORATORY LAPAROTOMY, SIGMOID COLECTOMY WITH COLOSTOMY (N/A)  Continuing wound care and IV antibiotics Waiting for ileus to resolve  LOS: 11 days    Eric [name] A 12/02/2014

## 2014-12-03 DIAGNOSIS — E785 Hyperlipidemia, unspecified: Secondary | ICD-10-CM

## 2014-12-03 LAB — GLUCOSE, CAPILLARY
GLUCOSE-CAPILLARY: 117 mg/dL — AB (ref 65–99)
GLUCOSE-CAPILLARY: 98 mg/dL (ref 65–99)
Glucose-Capillary: 127 mg/dL — ABNORMAL HIGH (ref 65–99)
Glucose-Capillary: 116 mg/dL — ABNORMAL HIGH (ref 65–99)
Glucose-Capillary: 177 mg/dL — ABNORMAL HIGH (ref 65–99)
Glucose-Capillary: 116 mg/dL — ABNORMAL HIGH (ref 65–99)

## 2014-12-03 MED ORDER — INSULIN ASPART 100 UNIT/ML ~~LOC~~ SOLN
0.0000 [IU] | Freq: Three times a day (TID) | SUBCUTANEOUS | Status: DC
  Administered 2014-12-04: 8 [IU] via SUBCUTANEOUS
  Administered 2014-12-04: 2 [IU] via SUBCUTANEOUS

## 2014-12-03 MED ORDER — HYDROCODONE-ACETAMINOPHEN 5-325 MG PO TABS
1.0000 | ORAL_TABLET | ORAL | Status: DC | PRN
  Administered 2014-12-03 (×2): 1 via ORAL
  Administered 2014-12-03 – 2014-12-04 (×4): 2 via ORAL
  Filled 2014-12-03: qty 1
  Filled 2014-12-03: qty 2
  Filled 2014-12-03: qty 1
  Filled 2014-12-03 (×3): qty 2

## 2014-12-03 MED ORDER — INSULIN ASPART 100 UNIT/ML ~~LOC~~ SOLN: 0.0000 [IU] | Freq: Every day | SUBCUTANEOUS | Status: DC

## 2014-12-03 NOTE — Consult Note (Signed)
WOC ostomy follow up Stoma type/location: LLQ Colostomy Stomal assessment/size: 1 and 5/8 inch oval Peristomal assessment: inact Treatment options for stomal/peristomal skin: skin barrier ring Output brown effluent Ostomy pouching: 1pc  Convex pouching system with skin barrier ring.  Education provided: Patient is easily confused during teaching session today. He is confusing midline wound with ostomy pouch and unsure about which is having stool output.  I have given him an educational booklet today; the one provided in ICU cannot be located. Pouch emptying is demonstrated today as well as stoma sizing and pouch change.  Several more sessions will be required at Rehab facility for independence to be mastered. I understand that this is the discharge plan, but I am reiterating the need for assistance to master self care in any area of ostomy management-emptying or changing. Enrolled patient in [facility] Start Discharge program: Yes [place] nursing team will follow, and will remain available to this patient, the nursing, surgical and medical teams.   Thanks, Maudie Flakes, MSN, RN, [place], [place], [place] [phone])

## 2014-12-03 NOTE — Progress Notes (Signed)
Physical Therapy Treatment Patient Details Name: Eric [name] MRN: [id number] DOB: 1927-01-19 Today's Date: 12/03/2014    History of Present Illness admitted 11/21/14 forabdominal pain, s/p Exploratory laparotomy, sigmoid colectomy, descending colostomy on 11/28/14    PT Comments    Pt able to ambulate in hallway today. Limited by back and abd/gas pain. Con't to recommend SNF for rehab.  Follow Up Recommendations  SNF;Supervision/Assistance - 24 hour     Equipment Recommendations  None recommended by PT    Recommendations for Other Services       Precautions / Restrictions Precautions Precaution Comments: wound VAC, L colostomy Restrictions Weight Bearing Restrictions: No    Mobility  Bed Mobility               General bed mobility comments: sitting up in recliner upon arrival  Transfers Overall transfer level: Needs assistance Equipment used: Rolling walker (2 wheeled) Transfers: Sit to/from Stand Sit to Stand: Min assist         General transfer comment: cues for hand placement  Ambulation/Gait Ambulation/Gait assistance: Min assist Ambulation Distance (Feet): 65 Feet Assistive device: Rolling walker (2 wheeled) Gait Pattern/deviations: Step-through pattern;Trunk flexed Gait velocity: decreased   General Gait Details: pt c/o 8/10 pain in back and abd during gait.  Cues for posture   Stairs            Wheelchair Mobility    Modified Rankin (Stroke Patients Only)       Balance Overall balance assessment: Needs assistance Sitting-balance support: Feet supported Sitting balance-Leahy Scale: Fair     Standing balance support: Bilateral upper extremity supported Standing balance-Leahy Scale: Poor                      Cognition Arousal/Alertness: Awake/alert Behavior During Therapy: Anxious Overall Cognitive Status: Within Functional Limits for tasks assessed                      Exercises      General  Comments        Pertinent Vitals/Pain Pain Assessment: 0-10 Pain Score: 8  Pain Location: back and gas pain in abd Pain Descriptors / Indicators: Aching Pain Intervention(s): Monitored during session;Patient requesting pain meds-RN notified    Home Living                      Prior Function            PT Goals (current goals can now be found in the care plan section) Acute Rehab PT Goals Patient Stated Goal: to walk again PT Goal Formulation: With patient Time For Goal Achievement: 12/14/14 Potential to Achieve Goals: Good Progress towards PT goals: Progressing toward goals    Frequency  Min 3X/week    PT Plan Current plan remains appropriate    Co-evaluation             End of Session Equipment Utilized During Treatment: Gait belt Activity Tolerance: Patient limited by pain Patient left: in chair;with call bell/phone within reach     Time: 1037-1050 PT Time Calculation (min) (ACUTE ONLY): 13 min  Charges:  $Gait Training: 8-22 mins                    G Codes:      Eric [name] 12/03/2014, 12:30 PM

## 2014-12-03 NOTE — Progress Notes (Signed)
[facility] PROGRESS NOTE  Eric [name] KYH:[id number] DOB: 1926-06-28 DOA: 11/21/2014 PCP: [name], MD  Assessment/Plan:  Diverticulitis of colon with perforation / leukocytosis / abdominal pain - Has been on Zosyn, antibiotics changed to Invanz by surgery 11/26/14. - Repeat CT scan 11/26/14 showed worsening free air, surgery aware of findings. - Pt had been refusing surgery early during this admission, however ultimately underwent exlap with sigmoid colectomy and descending colostomy on 6/22 after symptoms worsened - Pt remains with generalized post-operative pain that is improving - Ileus resolving. - Tolerating clears, diet advanced to soft per General Surgery - Per surgery, plans for 2 more days of abx   Hyponatremia / Grade I diastolic dysfunction - Mild, remaining stable thus far   Type 2 diabetes mellitus, controlled without complications - T5V about 7 months ago was 6.9 indicating good glycemic control. - Will continue sliding scale insulin   Dyslipidemia - Plan to resume statin therapy once patient is able to reliably tolerate PO.   Essential hypertension - For now, cont hydralazine IV as needed for blood pressure.   Coronary atherosclerosis - Aspirin remains on hold until patient tolerates regular diet.   DVT Prophylaxis  - SCD's bilaterally in hospital.    Back Pain - Suspect musculoskeletal from prolonged bedrest - Pt has known hx of chronic back pain - lumbar xray is notable for significant degenerative changes. - Have added Ketoralac for added pain control with improvement noted  Code Status: Full Family Communication: Pt in room Disposition Plan: Possible d/c to SNF in 1-2 days   Consultants:  General Surgery  Procedures:  Exlap with sigmoid colectomy and descending colostomy 6/22  Antibiotics:  Zosyn 6/15>>>6/20  Invanz 6/20>>>  HPI/Subjective: Does not want to go to SNF, but later agreeable with family at bedside.  Reports increased gas output via ostomy site  Objective: Filed Vitals:   12/02/14 1430 12/02/14 2140 12/03/14 0512 12/03/14 1426  BP: 144/85 105/84 135/62 126/60  Pulse: 81 81 74 84  Temp: 98.2 F (36.8 C) 98.1 F (36.7 C) 97.8 F (36.6 C) 98 F (36.7 C)  TempSrc: Oral Oral Oral Oral  Resp: 18 19 19 20   Height:      Weight:   98.8 kg (217 lb 13 oz)   SpO2: 98% 95% 95% 96%    Intake/Output Summary (Last 24 hours) at 12/03/14 1703 Last data filed at 12/03/14 1346  Gross per 24 hour  Intake   1380 ml  Output    125 ml  Net   1255 ml   Filed Weights   12/01/14 0454 12/02/14 0520 12/03/14 0512  Weight: 98.5 kg (217 lb 2.5 oz) 98.3 kg (216 lb 11.4 oz) 98.8 kg (217 lb 13 oz)    Exam:   General:  Awake, laying in bed, in nad  Cardiovascular: regular, s1, s2  Respiratory: normal resp effort, no wheezing  Abdomen: soft, decreased BS, mildly distended, generalized tenderness, ostomy site intact, gas in bag  Musculoskeletal: perfused, no clubbing, no cyanosis  Data Reviewed: Basic Metabolic Panel:  Recent Labs Lab 11/27/14 0459 12/01/14 0530  NA 133* 136  K 4.2 3.7  CL 101 103  CO2 25 25  GLUCOSE 105* 131*  BUN 13 13  CREATININE 0.90 0.74  CALCIUM 10.5* 9.5   Liver Function Tests: No results for input(s): AST, ALT, ALKPHOS, BILITOT, PROT, ALBUMIN in the last 168 hours. No results for input(s): LIPASE, AMYLASE in the last 168 hours. No results for input(s): AMMONIA in  the last 168 hours. CBC:  Recent Labs Lab 11/28/14 0542 11/29/14 0350 11/30/14 0245 12/01/14 0530  WBC 12.6* 31.8* 18.7* 11.5*  HGB 12.3* 12.3* 11.8* 10.9*  HCT 38.1* 37.4* 36.4* 33.1*  MCV 98.4 99.7 98.9 98.5  PLT 230 262 219 205   Cardiac Enzymes:  Recent Labs Lab 11/30/14 0245 11/30/14 0835 11/30/14 1437  TROPONINI <0.03 <0.03 <0.03   BNP (last 3 results) No results for input(s): BNP in the last 8760 hours.  ProBNP (last 3 results)  Recent Labs  02/23/14 1232  PROBNP  35.0    CBG:  Recent Labs Lab 12/02/14 1950 12/03/14 0001 12/03/14 0510 12/03/14 0731 12/03/14 1200  GLUCAP 140* 98 116* 117* 177*    Recent Results (from the past 240 hour(s))  Surgical pcr screen     Status: None   Collection Time: 11/28/14 10:00 AM  Result Value Ref Range Status   MRSA, PCR NEGATIVE NEGATIVE Final   Staphylococcus aureus NEGATIVE NEGATIVE Final    Comment:        The Xpert SA Assay (FDA approved for NASAL specimens in patients over 79 years of age), is one component of a comprehensive surveillance program.  Test performance has been validated by [facility] for patients greater than or equal to 79 year old. It is not intended to diagnose infection nor to guide or monitor treatment.      Studies: Dg Chest Port 1 View  12/02/2014   CLINICAL DATA:  Cardiomyopathy.  Sick sinus syndrome.  EXAM: PORTABLE CHEST - 1 VIEW  COMPARISON:  11/28/2014  FINDINGS: There is a left chest wall pacer device. Previous median sternotomy and CABG procedure. Atelectasis is identified within the left base. No pleural effusion or edema.  IMPRESSION: 1. Left base atelectasis.   Electronically Signed   By: Kerby Moors M.D.   On: 12/02/2014 11:22    Scheduled Meds: . enoxaparin (LOVENOX) injection  40 mg Subcutaneous Q24H  . ertapenem  1 g Intravenous Q24H  . insulin aspart  0-15 Units Subcutaneous TID WC  . insulin aspart  0-5 Units Subcutaneous QHS  . pantoprazole (PROTONIX) IV  40 mg Intravenous Q24H  . sodium chloride  3 mL Intravenous Q12H   Continuous Infusions: . dextrose 75 mL/hr at 12/03/14 0102    Principal Problem:   Diverticulitis of colon with perforation Active Problems:   Type 2 diabetes mellitus, controlled   Dyslipidemia   Essential hypertension   Coronary atherosclerosis   GERD   Pacemaker   GERD (gastroesophageal reflux disease)   Leukocytosis   Hyponatremia   Diastolic dysfunction, Grade 1   CAD (coronary artery disease) of artery bypass  graft   Cicero Noy, [facility] Pager [phone]. If 7PM-7AM, please contact night-coverage at [email or web address], password [facility] 12/03/2014, 5:03 PM  LOS: 12 days

## 2014-12-03 NOTE — Progress Notes (Signed)
D/c'd wound vac to midline abdominal dressing and replaced with wet-to-dry dressing per Md order.

## 2014-12-03 NOTE — Progress Notes (Signed)
Patient ID: Eric [name], male   DOB: May 25, 1927, 81 y.o.   MRN: [id number] 5 Days Post-Op  Subjective: Pt feels well today.  Wants to know how to work his colostomy.  Tolerating clear liquids with no issues.  Pain controlled.  Objective: Vital signs in last 24 hours: Temp:  [97.8 F (36.6 C)-98.2 F (36.8 C)] 97.8 F (36.6 C) (06/27 0512) Pulse Rate:  [74-81] 74 (06/27 0512) Resp:  [18-19] 19 (06/27 0512) BP: (105-144)/(62-85) 135/62 mmHg (06/27 0512) SpO2:  [95 %-98 %] 95 % (06/27 0512) Weight:  [98.8 kg (217 lb 13 oz)] 98.8 kg (217 lb 13 oz) (06/27 0512) Last BM Date: 11/27/14  Intake/Output from previous day: 06/26 0701 - 06/27 0700 In: 900 [I.V.:900] Out: 325 [Urine:250; Stool:75] Intake/Output this shift:    PE: Abd: soft, appropriately tender, +BS, ND, ostomy with air and stool in bag, incision with wound VAC in place.  Lab Results:   Recent Labs  12/01/14 0530  WBC 11.5*  HGB 10.9*  HCT 33.1*  PLT 205   BMET  Recent Labs  12/01/14 0530  NA 136  K 3.7  CL 103  CO2 25  GLUCOSE 131*  BUN 13  CREATININE 0.74  CALCIUM 9.5   PT/INR No results for input(s): LABPROT, INR in the last 72 hours. CMP     Component Value Date/Time   NA 136 12/01/2014 0530   NA 135* 04/11/2014   K 3.7 12/01/2014 0530   CL 103 12/01/2014 0530   CO2 25 12/01/2014 0530   GLUCOSE 131* 12/01/2014 0530   BUN 13 12/01/2014 0530   BUN 11 04/11/2014   CREATININE 0.74 12/01/2014 0530   CREATININE 1.0 04/11/2014   CALCIUM 9.5 12/01/2014 0530   PROT 7.3 11/22/2014 0450   ALBUMIN 4.0 11/22/2014 0450   AST 28 11/22/2014 0450   ALT 20 11/22/2014 0450   ALKPHOS 66 11/22/2014 0450   BILITOT 1.2 11/22/2014 0450   GFRNONAA >60 12/01/2014 0530   GFRAA >60 12/01/2014 0530   Lipase     Component Value Date/Time   LIPASE <10* 11/21/2014 1700       Studies/Results: Dg Lumbar Spine 2-3 Views  12/01/2014   CLINICAL DATA:  Current back pain got worse after surgery a couple of  days ago. Recent colon resection. Drainage on the left side on.  EXAM: LUMBAR SPINE - 2-3 VIEW  COMPARISON:  11/26/2014 CT  FINDINGS: There is significant degenerative change throughout the lumbar spine. No evidence for acute fracture or traumatic subluxation. There is atherosclerotic calcification of the abdominal aorta. Residual contrast in the bowel after recent CT exam. Patient has had previous right hip arthroplasty. Prostate seeds are noted.  IMPRESSION: 1. Significant degenerative changes. 2.  No evidence for acute  abnormality. 3. Abdominal aorta atherosclerosis.   Electronically Signed   By: Nolon Nations M.D.   On: 12/01/2014 17:10   Dg Chest Port 1 View  12/02/2014   CLINICAL DATA:  Cardiomyopathy.  Sick sinus syndrome.  EXAM: PORTABLE CHEST - 1 VIEW  COMPARISON:  11/28/2014  FINDINGS: There is a left chest wall pacer device. Previous median sternotomy and CABG procedure. Atelectasis is identified within the left base. No pleural effusion or edema.  IMPRESSION: 1. Left base atelectasis.   Electronically Signed   By: Kerby Moors M.D.   On: 12/02/2014 11:22    Anti-infectives: Anti-infectives    Start     Dose/Rate Route Frequency Ordered Stop   11/26/14 1730  ertapenem (  INVANZ) 1 g in sodium chloride 0.9 % 50 mL IVPB     1 g 100 mL/hr over 30 Minutes Intravenous Every 24 hours 11/26/14 1636     11/21/14 2330  piperacillin-tazobactam (ZOSYN) IVPB 3.375 g  Status:  Discontinued     3.375 g 12.5 mL/hr over 240 Minutes Intravenous 3 times per day 11/21/14 2325 11/26/14 1636   11/21/14 2130  piperacillin-tazobactam (ZOSYN) IVPB 3.375 g     3.375 g 12.5 mL/hr over 240 Minutes Intravenous  Once 11/21/14 2127 11/22/14 0150   11/21/14 2115  metroNIDAZOLE (FLAGYL) IVPB 500 mg  Status:  Discontinued     500 mg 100 mL/hr over 60 Minutes Intravenous  Once 11/21/14 2109 11/21/14 2127       Assessment/Plan  Perforated sigmoid diverticulitis POD#5 exploratory laparotomy, sigmoid  Colectomy, descending colostomy---Dr. Brien Mates -advance to full liquids -WOC consult -VAC MWF, will change to NS WD dressing changes when ready for DC to SNF -IS, mobilize, PT eval ID- Invanz D#7/9 for total of 7 days post op FEN-oral pain meds VTE prophylaxis-SCDs, lovenox. Dispo-hopefully stable for dc to SNF in 1-2 days   LOS: 12 days    Maximilian Tallo E 12/03/2014, 7:34 AM Pager: [phone]

## 2014-12-03 NOTE — Care Management (Signed)
IM LETTER GIVEN TO PATIENT  

## 2014-12-03 NOTE — Clinical Social Work Placement (Signed)
   CLINICAL SOCIAL WORK PLACEMENT  NOTE  Date:  12/03/2014  Patient Details  Name: Eric [name] MRN: [id number] Date of Birth: Feb 01, 1927  Clinical Social Work is seeking post-discharge placement for this patient at the [place] level of care (*CSW will initial, date and re-position this form in  chart as items are completed):  Yes   Patient/family provided with [facility] Work Department's list of facilities offering this level of care within the geographic area requested by the patient (or if unable, by the patient's family).  Yes   Patient/family informed of their freedom to choose among providers that offer the needed level of care, that participate in Medicare, Medicaid or managed care program needed by the patient, have an available bed and are willing to accept the patient.  Yes   Patient/family informed of [facility]'s ownership interest in [facility] and [facility], as well as of the fact that they are under no obligation to receive care at these facilities.  PASRR submitted to EDS on       PASRR number received on       Existing PASRR number confirmed on 12/03/14     FL2 transmitted to all facilities in geographic area requested by pt/family on 12/03/14     FL2 transmitted to all facilities within larger geographic area on       Patient informed that his/her managed care company has contracts with or will negotiate with certain facilities, including the following:            Patient/family informed of bed offers received.  Patient chooses bed at       Physician recommends and patient chooses bed at      Patient to be transferred to   on  .  Patient to be transferred to facility by       Patient family notified on   of transfer.  Name of family member notified:        PHYSICIAN       Additional Comment:    _______________________________________________ Boone Master, [place] 12/03/2014, 2:15 PM

## 2014-12-03 NOTE — Clinical Social Work Note (Signed)
Clinical Social Work Assessment  Patient Details  Name: Eric [name] MRN: [id number] Date of Birth: 11/29/1926  Date of referral:  12/03/14               Reason for consult:  Facility Placement                Permission sought to share information with:  Family Supports Permission granted to share information::  Yes, Verbal Permission Granted  Name::     Film/video editor::     Relationship::  daughter  Contact Information:     Housing/Transportation Living arrangements for the past 2 months:  Single Family Home Source of Information:  Patient, Adult Children Patient Interpreter Needed:  None Criminal Activity/Legal Involvement Pertinent to Current Situation/Hospitalization:  No - Comment as needed Significant Relationships:  Adult Children Lives with:  Self Do you feel safe going back to the place where you live?  No Need for family participation in patient care:  Yes (Comment)  Care giving concerns:  Patient reports he does not enough support to return home because of dressing changes needed and feeling weak.   Social Worker assessment / plan:  CSW received referral in order to assist with DC planning. CSW reviewed chart and met with patient at bedside. CSW introduced myself and explained role.  Patient reports he is feeling better and agreeable to SNF. Patient reports he has four dtrs that live nearby but knows he requires more assistance at this time. Patient has been to SNF in the past and reports that [facility] is close to family. CSW provided SNF list and explained process. Patient asked CSW to call his dtr [name]) to explain process and make sure she did not have any questions. Dtr understanding of process and requests placement at [facility] if possible.  CSW completed FL2 and faxed out. CSW will follow up with bed offers.  Employment status:  Retired Forensic scientist:  Medicare PT Recommendations:  [place] / Referral to community  resources:  [place]  Patient/Family's Response to care:  Patient engaged in assessment but has flat affect. Patient aware that he needs further assistance. Patient hopeful that dtrs will come visit often so he does not feel lonely when he at the SNF.  Patient/Family's Understanding of and Emotional Response to Diagnosis, Current Treatment, and Prognosis:  Patient reports he has had major surgery and did not realize how much he would hurt. Patient reports he is more deconditioned than he thought he would be and is hopeful to "get moving" soon so that he does not feel as sore.  Emotional Assessment Appearance:  Appears stated age Attitude/Demeanor/Rapport:  Other Affect (typically observed):  Flat Orientation:  Oriented to Self, Oriented to Place, Oriented to  Time, Oriented to Situation Alcohol / Substance use:  Not Applicable Psych involvement (Current and /or in the community):  No (Comment)  Discharge Needs  Concerns to be addressed:  No discharge needs identified Readmission within the last 30 days:  No Current discharge risk:  Lives alone Barriers to Discharge:  Continued Medical Work up   [employer], [place] 12/03/2014, 2:23 PM  [phone]

## 2014-12-04 ENCOUNTER — Encounter: Payer: Medicare Other | Admitting: Internal Medicine

## 2014-12-04 DIAGNOSIS — E871 Hypo-osmolality and hyponatremia: Secondary | ICD-10-CM | POA: Diagnosis not present

## 2014-12-04 DIAGNOSIS — I110 Hypertensive heart disease with heart failure: Secondary | ICD-10-CM | POA: Diagnosis not present

## 2014-12-04 DIAGNOSIS — I5032 Chronic diastolic (congestive) heart failure: Secondary | ICD-10-CM | POA: Diagnosis not present

## 2014-12-04 DIAGNOSIS — K5720 Diverticulitis of large intestine with perforation and abscess without bleeding: Secondary | ICD-10-CM | POA: Diagnosis not present

## 2014-12-04 DIAGNOSIS — I25810 Atherosclerosis of coronary artery bypass graft(s) without angina pectoris: Secondary | ICD-10-CM | POA: Diagnosis not present

## 2014-12-04 DIAGNOSIS — E119 Type 2 diabetes mellitus without complications: Secondary | ICD-10-CM | POA: Diagnosis not present

## 2014-12-04 DIAGNOSIS — K5732 Diverticulitis of large intestine without perforation or abscess without bleeding: Secondary | ICD-10-CM | POA: Diagnosis not present

## 2014-12-04 DIAGNOSIS — R488 Other symbolic dysfunctions: Secondary | ICD-10-CM | POA: Diagnosis not present

## 2014-12-04 DIAGNOSIS — R2681 Unsteadiness on feet: Secondary | ICD-10-CM | POA: Diagnosis not present

## 2014-12-04 DIAGNOSIS — M545 Low back pain: Secondary | ICD-10-CM | POA: Diagnosis not present

## 2014-12-04 DIAGNOSIS — D72829 Elevated white blood cell count, unspecified: Secondary | ICD-10-CM | POA: Diagnosis not present

## 2014-12-04 DIAGNOSIS — I509 Heart failure, unspecified: Secondary | ICD-10-CM | POA: Diagnosis not present

## 2014-12-04 DIAGNOSIS — I5030 Unspecified diastolic (congestive) heart failure: Secondary | ICD-10-CM | POA: Diagnosis not present

## 2014-12-04 DIAGNOSIS — I10 Essential (primary) hypertension: Secondary | ICD-10-CM | POA: Diagnosis not present

## 2014-12-04 DIAGNOSIS — I2510 Atherosclerotic heart disease of native coronary artery without angina pectoris: Secondary | ICD-10-CM | POA: Diagnosis not present

## 2014-12-04 DIAGNOSIS — K5741 Diverticulitis of both small and large intestine with perforation and abscess with bleeding: Secondary | ICD-10-CM | POA: Diagnosis not present

## 2014-12-04 DIAGNOSIS — E785 Hyperlipidemia, unspecified: Secondary | ICD-10-CM | POA: Diagnosis not present

## 2014-12-04 DIAGNOSIS — M6281 Muscle weakness (generalized): Secondary | ICD-10-CM | POA: Diagnosis not present

## 2014-12-04 LAB — GLUCOSE, CAPILLARY
Glucose-Capillary: 138 mg/dL — ABNORMAL HIGH (ref 65–99)
Glucose-Capillary: 300 mg/dL — ABNORMAL HIGH (ref 65–99)
Glucose-Capillary: 138 mg/dL — ABNORMAL HIGH (ref 65–99)

## 2014-12-04 MED ORDER — HYDROCODONE-ACETAMINOPHEN 5-325 MG PO TABS: 1.0000 | ORAL_TABLET | ORAL | Status: DC | PRN

## 2014-12-04 MED ORDER — BOOST / RESOURCE BREEZE PO LIQD: 1.0000 | Freq: Two times a day (BID) | ORAL | Status: DC

## 2014-12-04 MED ORDER — BOOST / RESOURCE BREEZE PO LIQD
1.0000 | Freq: Two times a day (BID) | ORAL | Status: DC
Start: 2014-12-04 — End: 2015-04-04

## 2014-12-04 MED ORDER — AMOXICILLIN-POT CLAVULANATE 875-125 MG PO TABS
1.0000 | ORAL_TABLET | Freq: Two times a day (BID) | ORAL | Status: DC
  Administered 2014-12-04: 1 via ORAL
  Filled 2014-12-04 (×2): qty 1

## 2014-12-04 MED ORDER — AMOXICILLIN-POT CLAVULANATE 875-125 MG PO TABS: 1.0000 | ORAL_TABLET | Freq: Two times a day (BID) | ORAL | Status: DC

## 2014-12-04 NOTE — Discharge Summary (Signed)
Physician Discharge Summary  HELMER DULL AOZ:[id number] DOB: Jan 18, 1927 DOA: 11/21/2014  PCP: [name], MD  Admit date: 11/21/2014 Discharge date: 12/04/2014  Time spent: 20 minutes  Recommendations for Outpatient Follow-up:  1. Follow up with PCP in 1-2 weeks 2. Follow up with General Surgery as scheduled 3. Per General Surgery, normal saline wet to dry dressing changes to midline wound BID  Discharge Diagnoses:  Principal Problem:   Diverticulitis of colon with perforation Active Problems:   Type 2 diabetes mellitus, controlled   Dyslipidemia   Essential hypertension   Coronary atherosclerosis   GERD   Pacemaker   GERD (gastroesophageal reflux disease)   Leukocytosis   Hyponatremia   Diastolic dysfunction, Grade 1   CAD (coronary artery disease) of artery bypass graft   Discharge Condition: Improved  Diet recommendation: Diabetic, soft  Filed Weights   12/02/14 0520 12/03/14 0512 12/04/14 0556  Weight: 98.3 kg (216 lb 11.4 oz) 98.8 kg (217 lb 13 oz) 98.5 kg (217 lb 2.5 oz)    History of present illness:  Please see admit h and p from 6/15 for details. Briefly, pt presented with abdominal pain, found to have radiologic evidence worrisome for sigmoid colonic diverticulitis. The patient was admitted for further work up.  Hospital Course:   Diverticulitis of colon with perforation / leukocytosis / abdominal pain - Has been on Zosyn, antibiotics were changed to Invanz by surgery 11/26/14. - Repeat CT scan 11/26/14 showed worsening free air - Pt had been refusing surgery early during this admission, however ultimately agreed with and underwent exlap with sigmoid colectomy and descending colostomy on 6/22 after symptoms continued to worsen - Pt remained remained with generalized post-operative pain that is gradually improving - Ileus resolved. - Patient was able to advance to soft per General Surgery - Patient's antibiotc was transitioned to augmentin on 6/28 to  complete course after finishing 6/29 dose   Hyponatremia / Grade I diastolic dysfunction - Mild, remained stable thus far   Type 2 diabetes mellitus, controlled without complications - N6E about 7 months ago was 6.9 indicating good glycemic control. - Continued sliding scale insulin while inpatient   Dyslipidemia - would resume statin therapy after d/c.   Essential hypertension - Remained stable - Patient to resume home BP meds on discharge   Coronary atherosclerosis - Aspirin to resume on discharge as patient is tolerating diet.   DVT Prophylaxis  - SCD's bilaterally in hospital.   Back Pain - Suspect musculoskeletal from prolonged bedrest - Pt has known hx of chronic back pain - lumbar xray is notable for significant degenerative changes. - Continue with analgesics and PT as tolerated  Procedures:  Exlap with sigmoid colectomy and descending colostomy 6/22  Consultations:  General Surgery  Discharge Exam: Filed Vitals:   12/03/14 0512 12/03/14 1426 12/03/14 2224 12/04/14 0556  BP: 135/62 126/60 137/66 154/63  Pulse: 74 84 78 80  Temp: 97.8 F (36.6 C) 98 F (36.7 C) 98.3 F (36.8 C) 98 F (36.7 C)  TempSrc: Oral Oral Oral Oral  Resp: 19 20 20 20   Height:      Weight: 98.8 kg (217 lb 13 oz)   98.5 kg (217 lb 2.5 oz)  SpO2: 95% 96% 94% 97%    General: Awake, in nad Cardiovascular: regular, s1, s2 Respiratory: normal resp effort, no wheezing  Discharge Instructions     Medication List    STOP taking these medications        clarithromycin 500 MG tablet  Commonly known as:  BIAXIN     HYDROcodone-acetaminophen 7.5-325 MG per tablet  Commonly known as:  NORCO  Replaced by:  HYDROcodone-acetaminophen 5-325 MG per tablet     loperamide 2 MG tablet  Commonly known as:  IMODIUM A-D     ranitidine 150 MG tablet  Commonly known as:  ZANTAC     senna 8.6 MG tablet  Commonly known as:  SENOKOT      TAKE these medications         amoxicillin-clavulanate 875-125 MG per tablet  Commonly known as:  AUGMENTIN  Take 1 tablet by mouth every 12 (twelve) hours.     aspirin EC 81 MG tablet  Take 1 tablet (81 mg total) by mouth daily.     chlorthalidone 25 MG tablet  Commonly known as:  HYGROTON  Take 25 mg by mouth daily.     feeding supplement (RESOURCE BREEZE) Liqd  Take 1 Container by mouth 2 (two) times daily between meals.     fluticasone 50 MCG/ACT nasal spray  Commonly known as:  FLONASE  Place 2 sprays into both nostrils daily. Allergies     HYDROcodone-acetaminophen 5-325 MG per tablet  Commonly known as:  NORCO/VICODIN  Take 1-2 tablets by mouth every 4 (four) hours as needed for moderate pain.     isosorbide mononitrate 30 MG 24 hr tablet  Commonly known as:  IMDUR  Take 1 tablet (30 mg total) by mouth every evening.     nitroGLYCERIN 0.4 MG SL tablet  Commonly known as:  NITROSTAT  Place 0.4 mg under the tongue every 5 (five) minutes as needed. Chest pain     pravastatin 20 MG tablet  Commonly known as:  PRAVACHOL  Take 3 tablets (60 mg total) by mouth daily.     PROVENTIL HFA 108 (90 BASE) MCG/ACT inhaler  Generic drug:  albuterol  Inhale 2 puffs into the lungs every 4 (four) hours as needed for shortness of breath. Wheezing     albuterol (2.5 MG/3ML) 0.083% nebulizer solution  Commonly known as:  PROVENTIL  USE 1 VIAL WITH NEBULIZER EVERY 4 HOURS AS NEEDED FOR WHEEZING     SYSTANE 0.4-0.3 % Soln  Generic drug:  Polyethyl Glycol-Propyl Glycol  Apply 2 drops to eye 4 (four) times daily.     valsartan 160 MG tablet  Commonly known as:  DIOVAN  Take 1 tablet (160 mg total) by mouth daily.       Allergies  Allergen Reactions  . Actos [Pioglitazone Hydrochloride] Other (See Comments)    "felt funny, drowsy, and weak":  . Celebrex [Celecoxib] Other (See Comments)    "felt funny"  . Demerol Palpitations and Other (See Comments)    Increased BP  . Morphine And Related Nausea And  Vomiting  . Ciprofloxacin Other (See Comments)    arthralgia  . Metformin Nausea And Vomiting  . Zocor [Simvastatin] Other (See Comments)    Makes pt very drowsy   Follow-up Information    Follow up with Odis Hollingshead, MD On 12/19/2014.   Specialty:  General Surgery   Why:  10:40am, arrive by 10:10am for paperwork   Contact information:   [address] [place] [address] [phone]       Follow up with [name], MD. Schedule an appointment as soon as possible for a visit in 2 weeks.   Specialty:  Internal Medicine   Why:  Hospital follow up   Contact information:   [address] [place] [address] [phone]        The results of significant diagnostics from this hospitalization (including imaging, microbiology, ancillary and laboratory) are listed below for reference.    Significant Diagnostic Studies: Dg Lumbar Spine 2-3 Views  12/01/2014   CLINICAL DATA:  Current back pain got worse after surgery a couple of days ago. Recent colon resection. Drainage on the left side on.  EXAM: LUMBAR SPINE - 2-3 VIEW  COMPARISON:  11/26/2014 CT  FINDINGS: There is significant degenerative change throughout the lumbar spine. No evidence for acute fracture or traumatic subluxation. There is atherosclerotic calcification of the abdominal aorta. Residual contrast in the bowel after recent CT exam. Patient has had previous right hip arthroplasty. Prostate seeds are noted.  IMPRESSION: 1. Significant degenerative changes. 2.  No evidence for acute  abnormality. 3. Abdominal aorta atherosclerosis.   Electronically Signed   By: Nolon Nations M.D.   On: 12/01/2014 17:10   Ct Abdomen Pelvis W Contrast  11/26/2014   CLINICAL DATA:  81 year old with left lower quadrant pain for 2-3 months. Pain radiates to the back. Evaluate for diverticulitis. Patient complains of increased pain.  EXAM: CT ABDOMEN AND PELVIS WITH CONTRAST  TECHNIQUE:  Multidetector CT imaging of the abdomen and pelvis was performed using the standard protocol following bolus administration of intravenous contrast.  CONTRAST:  100 mL Omnipaque 300  COMPARISON:  11/21/2014 and had 03/13/2014  FINDINGS: Chronic densities in the left lower lobe. There is a large amount of free air in the abdomen. In addition, there are innumerable gas bubbles throughout the abdominal fat. Again noted is a large gas filled structure involving the sigmoid colon. This structure now measures 8.3 x 7.2 x 5.3 cm and previously measured 7.4 x 8.3 x 4.4 cm. There are decreased inflammatory changes surrounding this gas-filled structure and sigmoid colon. There is oral contrast throughout the small and large bowel including the rectal region.  Again noted are multiple low-density structures throughout the liver which are most compatible with hepatic cysts, largest in the left hepatic lobe measuring up to 4.3 cm. No acute abnormality to the gallbladder. The portal venous system is patent. Fat infiltration of the pancreas. Normal appearance of the spleen. Normal appearance of the adrenal glands. Stable nodule along the posterior right hemidiaphragm measures 1.8 x 1.0 cm and minimally changed since 2015. Stable low-density cyst in left kidney. Evidence for additional small renal cysts. No acute abnormality to the kidneys.  The abdominal aorta and visceral arteries are heavily calcified. No evidence for an aortic aneurysm.  Patient has penile implants with a fluid reservoir in the left hemipelvis. There prostate seed implants. Urinary bladder is decompressed. There is no significant free fluid or lymphadenopathy within the abdomen or pelvis.  Right hip replacement is located. No acute bone abnormality. Multilevel degenerative disc disease in lumbar spine.  IMPRESSION: Large amount of free air throughout the abdomen compatible with a perforated viscus. The source of the bowel perforation is likely from the sigmoid  colon and the large gas-filled collection. The large gas-filled collection involves the sigmoid colon and slightly increased in size. The inflammatory changes surrounding the sigmoid colon have decreased.  No focal fluid collection or free fluid.  Numerous hepatic and renal cysts.  Critical Value/emergent results were called by telephone at the time of interpretation on 11/26/2014 at 3:46 pm to Dr. Jacquelynn Cree , who verbally acknowledged these results.   Electronically Signed  By: Markus Daft M.D.   On: 11/26/2014 15:54   Ct Abdomen Pelvis W Contrast  11/21/2014   CLINICAL DATA:  81 year old male with 2-3 day history of lower abdominal pain, distention and constipation. Past history of diverticulitis and prostate cancer.  EXAM: CT ABDOMEN AND PELVIS WITH CONTRAST  TECHNIQUE: Multidetector CT imaging of the abdomen and pelvis was performed using the standard protocol following bolus administration of intravenous contrast.  CONTRAST:  128mL OMNIPAQUE IOHEXOL 300 MG/ML  SOLN  COMPARISON:  Prior CT abdomen/ pelvis 03/13/2014  FINDINGS: Lower Chest: Dependent atelectasis in the lower lobes. The lungs are otherwise clear. Visualized cardiac structures within normal limits for size. No pericardial effusion. Sequelae of prior CABG and pacemaker placement. Unremarkable visualized distal thoracic esophagus.  Abdomen: Unremarkable CT appearance of the stomach, duodenum, spleen, adrenal glands. Diffuse fatty atrophy of the pancreas. No mass or inflammation. Multiple circumscribed low-attenuation lesions scattered throughout the liver the largest of which measures 4.4 cm and is water attenuation consistent with a simple cyst. Many of the other lesions are too small for accurate characterization but remain unchanged in size, number and configuration. These almost certainly represent additional small cysts or biliary hamartomas. It is stable to slightly decreased size of a soft tissue nodule posterior to the right liver along  the peritoneal surface which measures 17 x 8 mm compared to 19 x 9 mm previously. While nonspecific in appearance, this abnormality is remains essentially unchanged dating back to 2011. Greater than 5 year stability is consistent with benignity. Gallbladder is unremarkable. No intra or extrahepatic biliary ductal dilatation.  Unremarkable appearance of the bilateral kidneys. No focal solid lesion, hydronephrosis or nephrolithiasis. 2.9 cm simple cyst in the left lower pole. Additional tiny low-attenuation lesions are too small for characterization but statistically likely also benign cysts.  Extensive sigmoid colonic diverticula. There is focal inflammation of the proximal sigmoid colon consistent with acute diverticulitis. A large air collection is present within the anterior colonic wall measuring 8.3 x 4.4 cm. This likely represents a contained micro perforation. No fluid component to definitively suggest abscess.  Just inferior to the inflamed portion of colon lies the reservoir for the patient's penile prosthesis. This prosthesis may be extraperitoneal. No evidence of small bowel obstruction. Normal appendix in the right lower quadrant. No free fluid or suspicious adenopathy.  Pelvis: Left lower quadrant penile prosthesis. Multiple brachytherapy seeds project over the bladder. No suspicious adenopathy or free fluid.  Bones/Soft Tissues: No acute fracture or aggressive appearing lytic or blastic osseous lesion. Right hip arthroplasty prosthesis. Lower lumbar degenerative disc disease.  Vascular: Atherosclerotic vascular disease without significant stenosis or aneurysmal dilatation.  IMPRESSION: 1. Sigmoid colonic diverticulitis with large intramural air collection within the anterior wall of the affected sigmoid segment concerning for contained micro perforation. No fluid component to suggest a drainable abscess. 2. Additional ancillary findings as above without significant interval change.   Electronically  Signed   By: Jacqulynn Cadet M.D.   On: 11/21/2014 21:00   Dg Chest Port 1 View  12/02/2014   CLINICAL DATA:  Cardiomyopathy.  Sick sinus syndrome.  EXAM: PORTABLE CHEST - 1 VIEW  COMPARISON:  11/28/2014  FINDINGS: There is a left chest wall pacer device. Previous median sternotomy and CABG procedure. Atelectasis is identified within the left base. No pleural effusion or edema.  IMPRESSION: 1. Left base atelectasis.   Electronically Signed   By: Kerby Moors M.D.   On: 12/02/2014 11:22   Dg Chest Port 1  View  11/28/2014   CLINICAL DATA:  Cough, shortness of breath for 2-3 days.  Pneumonia.  EXAM: PORTABLE CHEST - 1 VIEW  COMPARISON:  07/26/2014  FINDINGS: Left pacer remains in place, unchanged. Prior CABG. Bibasilar densities are stable compatible with scarring. Heart is normal size. No acute airspace opacities or effusions. No acute bony abnormality.  IMPRESSION: Bibasilar scarring.  No active disease.   Electronically Signed   By: Rolm Baptise M.D.   On: 11/28/2014 10:51    Microbiology: Recent Results (from the past 240 hour(s))  Surgical pcr screen     Status: None   Collection Time: 11/28/14 10:00 AM  Result Value Ref Range Status   MRSA, PCR NEGATIVE NEGATIVE Final   Staphylococcus aureus NEGATIVE NEGATIVE Final    Comment:        The Xpert SA Assay (FDA approved for NASAL specimens in patients over 6 years of age), is one component of a comprehensive surveillance program.  Test performance has been validated by [facility] for patients greater than or equal to 38 year old. It is not intended to diagnose infection nor to guide or monitor treatment.      Labs: Basic Metabolic Panel:  Recent Labs Lab 12/01/14 0530  NA 136  K 3.7  CL 103  CO2 25  GLUCOSE 131*  BUN 13  CREATININE 0.74  CALCIUM 9.5   Liver Function Tests: No results for input(s): AST, ALT, ALKPHOS, BILITOT, PROT, ALBUMIN in the last 168 hours. No results for input(s): LIPASE, AMYLASE in the last  168 hours. No results for input(s): AMMONIA in the last 168 hours. CBC:  Recent Labs Lab 11/28/14 0542 11/29/14 0350 11/30/14 0245 12/01/14 0530  WBC 12.6* 31.8* 18.7* 11.5*  HGB 12.3* 12.3* 11.8* 10.9*  HCT 38.1* 37.4* 36.4* 33.1*  MCV 98.4 99.7 98.9 98.5  PLT 230 262 219 205   Cardiac Enzymes:  Recent Labs Lab 11/30/14 0245 11/30/14 0835 11/30/14 1437  TROPONINI <0.03 <0.03 <0.03   BNP: BNP (last 3 results) No results for input(s): BNP in the last 8760 hours.  ProBNP (last 3 results)  Recent Labs  02/23/14 1232  PROBNP 35.0    CBG:  Recent Labs Lab 12/03/14 0731 12/03/14 1200 12/03/14 1717 12/03/14 2223 12/04/14 0724  GLUCAP 117* 177* 116* 138* 300*   Signed:  CHIU, STEPHEN K  [facility] 12/04/2014, 12:13 PM

## 2014-12-04 NOTE — Progress Notes (Signed)
Gave report to [place], Therapist, sports at [employer] [employer]. Left number in case she had additional questions.

## 2014-12-04 NOTE — Clinical Social Work Placement (Signed)
   CLINICAL SOCIAL WORK PLACEMENT  NOTE  Date:  12/04/2014  Patient Details  Name: Eric [name] MRN: [id number] Date of Birth: [birth year]  Clinical Social Work is seeking post-discharge placement for this patient at the [place] level of care (*CSW will initial, date and re-position this form in  chart as items are completed):  Yes   Patient/family provided with [place] Work Department's list of facilities offering this level of care within the geographic area requested by the patient (or if unable, by the patient's family).  Yes   Patient/family informed of their freedom to choose among providers that offer the needed level of care, that participate in Medicare, Medicaid or managed care program needed by the patient, have an available bed and are willing to accept the patient.  Yes   Patient/family informed of [facility]'s ownership interest in [facility] and [facility], as well as of the fact that they are under no obligation to receive care at these facilities.  PASRR submitted to EDS on       PASRR number received on       Existing PASRR number confirmed on 12/03/14     FL2 transmitted to all facilities in geographic area requested by pt/family on 12/03/14     FL2 transmitted to all facilities within larger geographic area on       Patient informed that his/her managed care company has contracts with or will negotiate with certain facilities, including the following:        Yes   Patient/family informed of bed offers received.  Patient chooses bed at [facility]     Physician recommends and patient chooses bed at      Patient to be transferred to [facility] on 12/04/14.  Patient to be transferred to facility by [facility]     Patient family notified on 12/04/14 of transfer.  Name of family member notified:  Toni-dtr     PHYSICIAN       Additional Comment:     _______________________________________________ Boone Master, [place] 12/04/2014, 1:40 PM

## 2014-12-04 NOTE — Progress Notes (Signed)
Clinical Social Work  Per MD, patient medically stable to DC. CSW spoke with patient and dtr [name]) who reports that they want patient to go to [facility]. Patient prefers [facility] for transportation and aware of no guarantee of payment. Patient is happy to be close to home so that family can visit him often. Patient reports he was worried about DC plans because he knows he cannot DC home.   CSW prepared DC packet with FL2, hard scripts and DC summary included. RN to call report. [facility] arranged for 2 pm. [facility] request #: [id number].  CSW is signing off but available if needed.  [place], [place] [phone]

## 2014-12-04 NOTE — Progress Notes (Signed)
Physical Therapy Treatment Patient Details Name: ASMAR BROZEK MRN: [id number] DOB: [birth year] Today's Date: 12/04/2014    History of Present Illness admitted 11/21/14 forabdominal pain, s/p Exploratory laparotomy, sigmoid colectomy, descending colostomy on 11/28/14    PT Comments    Assisted partial "log roll" OOB to amb a limited distance in hallway due to 8/10 ABD pain despite taking 2 pain meds earlier.  "I don't know what she gave me but it's not working".  Assisted back to bed to comfort and reported to RN.  Follow Up Recommendations  SNF     Equipment Recommendations  None recommended by PT    Recommendations for Other Services       Precautions / Restrictions Precautions Precaution Comments: recent ABD surgery and L colostomy Restrictions Weight Bearing Restrictions: No    Mobility  Bed Mobility Overal bed mobility: Needs Assistance Bed Mobility: Rolling;Sidelying to Sit Rolling: Min assist Sidelying to sit: Mod assist       General bed mobility comments: partial "log roll" to transition to EOB  Transfers Overall transfer level: Needs assistance Equipment used: Rolling walker (2 wheeled) Transfers: Sit to/from Stand Sit to Stand: Min assist         General transfer comment: increased time due to ABD pain  Ambulation/Gait Ambulation/Gait assistance: Min assist Ambulation Distance (Feet): 55 Feet Assistive device: Rolling walker (2 wheeled) Gait Pattern/deviations: Step-through pattern;Decreased stride length;Trunk flexed Gait velocity: decreased   General Gait Details: pt c/o 8/10 pain in back and abd during gait despite being pre medicated.  Pt close to tears at end of walk.   Stairs            Wheelchair Mobility    Modified Rankin (Stroke Patients Only)       Balance                                    Cognition Arousal/Alertness: Awake/alert Behavior During Therapy: Anxious Overall Cognitive Status: Within  Functional Limits for tasks assessed                      Exercises      General Comments        Pertinent Vitals/Pain Pain Score: 8  Pain Location: ABD pain which pt stated radiates to back Pain Descriptors / Indicators: Sharp Pain Intervention(s): Monitored during session;Repositioned;Limited activity within patient's tolerance;Patient requesting pain meds-RN notified    Home Living                      Prior Function            PT Goals (current goals can now be found in the care plan section) Progress towards PT goals: Progressing toward goals    Frequency  Min 3X/week    PT Plan Current plan remains appropriate    Co-evaluation             End of Session   Activity Tolerance: Patient limited by pain Patient left: in bed;with call bell/phone within reach     Time: 1036-1050 PT Time Calculation (min) (ACUTE ONLY): 14 min  Charges:  $Gait Training: 8-22 mins                    G Codes:      Rica Koyanagi  PTA [facility]  Acute  Rehab Pager      [phone]

## 2014-12-04 NOTE — Progress Notes (Signed)
Initial Nutrition Assessment  DOCUMENTATION CODES:  Obesity unspecified  INTERVENTION: - Will order Resource Breeze BID, each supplement provides 250 kcal and 9 grams of protein - RD will continue to monitor for needs  NUTRITION DIAGNOSIS:  Increased nutrient needs related to other (see comment) (s/p surgery) as evidenced by estimated needs.  GOAL:  Patient will meet greater than or equal to 90% of their needs  MONITOR:  PO intake, Supplement acceptance, Weight trends, Labs, I & O's  REASON FOR ASSESSMENT:  Other (Comment) (LOS)  ASSESSMENT: Per H&P, 81 y.o. male has a past medical history of LACTOSE INTOLERANCE; OBESITY; CARDIOMYOPATHY, ISCHEMIC; AORTIC SCLEROSIS; SICK SINUS/ TACHY-BRADY SYNDROME (09/2007); PERIPHERAL VASCULAR DISEASE; CAROTID BRUIT, RIGHT (02/27/2008); IBS (irritable bowel syndrome); ALLERGIC RHINITIS; ANEMIA-NOS; OA (osteoarthritis); COPD; GERD; HYPERLIPIDEMIA; HIATAL HERNIA; Diverticulosis; Prostate cancer; DIABETES MELLITUS-TYPE II; CORONARY ARTERY DISEASE; HYPERTENSION; Asthma; Sleep apnea; Partial small bowel obstruction; Primary hyperparathyroidism; SMALL BOWEL OBSTRUCTION (04/18/2009); Cataract; Symptomatic diverticulosis (01/18/2009); and echocardiogram.  Presented with 1 week of left lower quadrant pain but worse in the past 24 hours and diarrhea. He took some Imodium and diarrhea has stopped. But he is felt that his abdomen felt swollen he was unable to button up his pants.  Pt seen for extended LOS (13 days). He is POD #6 exploratory laparotomy, sigmoid colectomy, descending colostomy placement. Chart review indicates 50-100% intakes on average. Pt reports that he ate a few bites of breakfast this AM and has had a fair appetite that began over 1 year ago. He states that he often "could care less" if he ate or not due to lack of appetite. He states over this time he lost weight from his UBW of 225 lbs. Per weight hx review, pt has lost 7 lbs (3% body  weight) in the past 7 months which is not significant for time frame.   Pt does not drink nutrition supplements such as Boost or Ensure at home but likes juice and tries to drink water often to stay hydrated. Will trial Resource Breeze BID.  Likely variably meeting needs at this time. No muscle or fat wasting noted. Medications reviewed. Labs reviewed; CBGs: 98-300 mg/dL.  Height:  Ht Readings from Last 1 Encounters:  11/28/14 5\' 6"  (1.676 m)    Weight:  Wt Readings from Last 1 Encounters:  12/04/14 217 lb 2.5 oz (98.5 kg)    Ideal Body Weight:  64.5 kg (kg)  Wt Readings from Last 10 Encounters:  12/04/14 217 lb 2.5 oz (98.5 kg)  09/29/14 210 lb (95.255 kg)  09/10/14 215 lb (97.523 kg)  08/01/14 208 lb 12.8 oz (94.711 kg)  07/26/14 209 lb 3.2 oz (94.892 kg)  07/19/14 211 lb (95.709 kg)  07/16/14 216 lb 9.6 oz (98.249 kg)  07/11/14 214 lb 6 oz (97.24 kg)  05/23/14 220 lb 4 oz (99.905 kg)  05/09/14 224 lb (101.606 kg)    BMI:  Body mass index is 35.07 kg/(m^2).  Estimated Nutritional Needs:  Kcal:  1600-1800  Protein:  85-95 grams  Fluid:  2.2-2.5 L/day  Skin:  Wound (see comment) (abdominal surgical wound)  Diet Order:  DIET SOFT Room service appropriate?: Yes; Fluid consistency:: Thin  EDUCATION NEEDS:  No education needs identified at this time   Intake/Output Summary (Last 24 hours) at 12/04/14 1033 Last data filed at 12/04/14 0741  Gross per 24 hour  Intake   3185 ml  Output    250 ml  Net   2935 ml    Last BM:  6/27    Jarome Matin, RD, LDN Inpatient Clinical Dietitian Pager # [phone] After hours/weekend pager # [phone]

## 2014-12-04 NOTE — Progress Notes (Signed)
Patient ID: Eric [name], male   DOB: 1927/04/20, 82 y.o.   MRN: [id number] 6 Days Post-Op  Subjective: Pt doing well.  Tolerating solid diet.  No issues  Objective: Vital signs in last 24 hours: Temp:  [98 F (36.7 C)-98.3 F (36.8 C)] 98 F (36.7 C) (06/28 0556) Pulse Rate:  [78-84] 80 (06/28 0556) Resp:  [20] 20 (06/28 0556) BP: (126-154)/(60-66) 154/63 mmHg (06/28 0556) SpO2:  [94 %-97 %] 97 % (06/28 0556) Weight:  [98.5 kg (217 lb 2.5 oz)] 98.5 kg (217 lb 2.5 oz) (06/28 0556) Last BM Date: 11/27/14  Intake/Output from previous day: 06/27 0701 - 06/28 0700 In: 3225 [P.O.:600; I.V.:2625] Out: 300 [Urine:250; Stool:50] Intake/Output this shift: Total I/O In: 200 [P.O.:200] Out: -   PE: Abd: soft, wound is clean and packed.  +BS, ostomy with air and some liquid stool.  Lab Results:  No results for input(s): WBC, HGB, HCT, PLT in the last 72 hours. BMET No results for input(s): NA, K, CL, CO2, GLUCOSE, BUN, CREATININE, CALCIUM in the last 72 hours. PT/INR No results for input(s): LABPROT, INR in the last 72 hours. CMP     Component Value Date/Time   NA 136 12/01/2014 0530   NA 135* 04/11/2014   K 3.7 12/01/2014 0530   CL 103 12/01/2014 0530   CO2 25 12/01/2014 0530   GLUCOSE 131* 12/01/2014 0530   BUN 13 12/01/2014 0530   BUN 11 04/11/2014   CREATININE 0.74 12/01/2014 0530   CREATININE 1.0 04/11/2014   CALCIUM 9.5 12/01/2014 0530   PROT 7.3 11/22/2014 0450   ALBUMIN 4.0 11/22/2014 0450   AST 28 11/22/2014 0450   ALT 20 11/22/2014 0450   ALKPHOS 66 11/22/2014 0450   BILITOT 1.2 11/22/2014 0450   GFRNONAA >60 12/01/2014 0530   GFRAA >60 12/01/2014 0530   Lipase     Component Value Date/Time   LIPASE <10* 11/21/2014 1700       Studies/Results: Dg Chest Port 1 View  12/02/2014   CLINICAL DATA:  Cardiomyopathy.  Sick sinus syndrome.  EXAM: PORTABLE CHEST - 1 VIEW  COMPARISON:  11/28/2014  FINDINGS: There is a left chest wall pacer device. Previous  median sternotomy and CABG procedure. Atelectasis is identified within the left base. No pleural effusion or edema.  IMPRESSION: 1. Left base atelectasis.   Electronically Signed   By: Kerby Moors M.D.   On: 12/02/2014 11:22    Anti-infectives: Anti-infectives    Start     Dose/Rate Route Frequency Ordered Stop   11/26/14 1730  ertapenem (INVANZ) 1 g in sodium chloride 0.9 % 50 mL IVPB     1 g 100 mL/hr over 30 Minutes Intravenous Every 24 hours 11/26/14 1636     11/21/14 2330  piperacillin-tazobactam (ZOSYN) IVPB 3.375 g  Status:  Discontinued     3.375 g 12.5 mL/hr over 240 Minutes Intravenous 3 times per day 11/21/14 2325 11/26/14 1636   11/21/14 2130  piperacillin-tazobactam (ZOSYN) IVPB 3.375 g     3.375 g 12.5 mL/hr over 240 Minutes Intravenous  Once 11/21/14 2127 11/22/14 0150   11/21/14 2115  metroNIDAZOLE (FLAGYL) IVPB 500 mg  Status:  Discontinued     500 mg 100 mL/hr over 60 Minutes Intravenous  Once 11/21/14 2109 11/21/14 2127       Assessment/Plan Perforated sigmoid diverticulitis POD#6 exploratory laparotomy, sigmoid Colectomy, descending colostomy---Dr. Brien Mates -tolerating soft diet well -WOC consult -NS WD dressing changes to midline wound BID -IS, mobilize,  PT eval ID- Invanz D#8/9 for total of 7 days post op, will switch to oral abx therapy today to finish out course FEN-oral pain meds VTE prophylaxis-SCDs, lovenox. Dispo-ok for DC to SNF today from our standpoint.  Follow up arranged and in DC section along with instructions   LOS: 13 days    Glynis Hunsucker E 12/04/2014, 10:16 AM Pager: [phone]

## 2014-12-04 NOTE — Discharge Instructions (Signed)
[facility]      [facility] °[phone] ° °OPEN ABDOMINAL SURGERY: POST OP INSTRUCTIONS ° °Always review your discharge instruction sheet given to you by the facility where your surgery was performed. ° °IF YOU HAVE DISABILITY OR FAMILY LEAVE FORMS, YOU MUST BRING THEM TO THE OFFICE FOR PROCESSING.  PLEASE DO NOT GIVE THEM TO YOUR DOCTOR. ° °1. A prescription for pain medication may be given to you upon discharge.  Take your pain medication as prescribed, if needed.  If narcotic pain medicine is not needed, then you may take acetaminophen (Tylenol) or ibuprofen (Advil) as needed. °2. Take your usually prescribed medications unless otherwise directed. °3. If you need a refill on your pain medication, please contact your pharmacy. They will contact our office to request authorization.  Prescriptions will not be filled after 5pm or on week-ends. °4. You should follow a light diet the first few days after arrival home, such as soup and crackers, pudding, etc.unless your doctor has advised otherwise. A high-fiber, low fat diet can be resumed as tolerated.   Be sure to include lots of fluids daily. Most patients will experience some swelling and bruising on the chest and neck area.  Ice packs will help.  Swelling and bruising can take several days to resolve °5. Most patients will experience some swelling and bruising in the area of the incision. Ice pack will help. Swelling and bruising can take several days to resolve..  °6. It is common to experience some constipation if taking pain medication after surgery.  Increasing fluid intake and taking a stool softener will usually help or prevent this problem from occurring.  A mild laxative (Milk of Magnesia or Miralax) should be taken according to package directions if there are no bowel movements after 48 hours. °7.  You may have steri-strips (small skin tapes) in place directly over the incision.  These strips should be left on the skin for 7-10 days.  If your  surgeon used skin glue on the incision, you may shower in 24 hours.  The glue will flake off over the next 2-3 weeks.  Any sutures or staples will be removed at the office during your follow-up visit. You may find that a light gauze bandage over your incision may keep your staples from being rubbed or pulled. You may shower and replace the bandage daily. °8. ACTIVITIES:  You may resume regular (light) daily activities beginning the next day--such as daily self-care, walking, climbing stairs--gradually increasing activities as tolerated.  You may have sexual intercourse when it is comfortable.  Refrain from any heavy lifting or straining until approved by your doctor. °a. You may drive when you no longer are taking prescription pain medication, you can comfortably wear a seatbelt, and you can safely maneuver your car and apply brakes °b. Return to Work: ___________________________________ °9. You should see your doctor in the office for a follow-up appointment approximately two weeks after your surgery.  Make sure that you call for this appointment within a day or two after you arrive home to insure a convenient appointment time. °OTHER INSTRUCTIONS:  °_____________________________________________________________ °_____________________________________________________________ ° °WHEN TO CALL YOUR DOCTOR: °1. Fever over 101.0 °2. Inability to urinate °3. Nausea and/or vomiting °4. Extreme swelling or bruising °5. Continued bleeding from incision. °6. Increased pain, redness, or drainage from the incision. °7. Difficulty swallowing or breathing °8. Muscle cramping or spasms. °9. Numbness or tingling in hands or feet or around lips. ° °The clinic staff is available to   answer your questions during regular business hours.  Please dont hesitate to call and ask to speak to one of the nurses if you have concerns.  For further questions, please visit [email or web address]  Dressing Change two times a day, NS WD  to midline incision A dressing is a material placed over wounds. It keeps the wound clean, dry, and protected from further injury. This provides an environment that favors wound healing.  BEFORE YOU BEGIN  Get your supplies together. Things you may need include:  Saline solution.  Flexible gauze dressing.  Medicated cream.  Tape.  Gloves.  Abdominal dressing pads.  Gauze squares.  Plastic bags.  Take pain medicine 30 minutes before the dressing change if you need it.  Take a shower before you do the first dressing change of the day. Use plastic wrap or a plastic bag to prevent the dressing from getting wet. REMOVING YOUR OLD DRESSING   Wash your hands with soap and water. Dry your hands with a clean towel.  Put on your gloves.  Remove any tape.  Carefully remove the old dressing. If the dressing sticks, you may dampen it with warm water to loosen it, or follow your caregiver's specific directions.  Remove any gauze or packing tape that is in your wound.  Take off your gloves.  Put the gloves, tape, gauze, or any packing tape into a plastic bag. CHANGING YOUR DRESSING  Open the supplies.  Take the cap off the saline solution.  Open the gauze package so that the gauze remains on the inside of the package.  Put on your gloves.  Clean your wound as told by your caregiver.  If you have been told to keep your wound dry, follow those instructions.  Your caregiver may tell you to do one or more of the following:  Pick up the gauze. Pour the saline solution over the gauze. Squeeze out the extra saline solution.  Put medicated cream or other medicine on your wound if you have been told to do so.  Put the solution soaked gauze only in your wound, not on the skin around it.  Pack your wound loosely or as told by your caregiver.  Put dry gauze on your wound.  Put abdominal dressing pads over the dry gauze if your wet gauze soaks through.  Tape the abdominal  dressing pads in place so they will not fall off. Do not wrap the tape completely around the affected part (arm, leg, abdomen).  Wrap the dressing pads with a flexible gauze dressing to secure it in place.  Take off your gloves. Put them in the plastic bag with the old dressing. Tie the bag shut and throw it away.  Keep the dressing clean and dry until your next dressing change.  Wash your hands. SEEK MEDICAL CARE IF:  Your skin around the wound looks red.  Your wound feels more tender or sore.  You see pus in the wound.  Your wound smells bad.  You have a fever.  Your skin around the wound has a rash that itches and burns.  You see black or yellow skin in your wound that was not there before.  You feel nauseous, throw up, and feel very tired. Document Released: 07/02/2004 Document Revised: 08/17/2011 Document Reviewed: 04/06/2011 [facility] Patient Information 2015 [place], [place]. This information is not intended to replace advice given to you by your health care provider. Make sure you discuss any questions you have with your health care provider.  Colostomy  Home Guide A colostomy is an opening for stool to leave your body when a medical condition prevents it from leaving through the usual opening (rectum). During a surgery, a piece of large intestine (colon) is brought through a hole in the abdominal wall. The new opening is called a stoma or ostomy. A bag or pouch fits over the stoma to catch stool and gas. Your stool may be liquid, somewhat pasty, or formed. CARING FOR YOUR STOMA  Normally, the stoma looks a lot like the inside of your cheek: pink, red, and moist. At first it may be swollen, but this swelling will decrease within 6 weeks. Keep the skin around your stoma clean and dry. You can gently wash your stoma and the skin around your stoma in the shower with a clean, soft washcloth. If you develop any skin irritation, your caregiver may give you a stoma powder or ointment  to help heal the area. Do not use any products other than those specifically given to you by your caregiver.  Your stoma should not be uncomfortable. If you notice any stinging or burning, your pouch may be leaking, and the skin around your stoma may be coming into contact with stool. This can cause skin irritation. If you notice stinging, replace your pouch with a new one and discard the old one. OSTOMY POUCHES  The pouch that fits over the ostomy can be made up of either 1 or 2 pieces. A one-piece pouch has a skin barrier piece and the pouch itself in one unit. A two-piece pouch has a skin barrier with a separate pouch that snaps on and off of the skin barrier. Either way, you should empty the pouch when it is only  to  full. Do not let more stool or gas build up. This could cause the pouch to leak. Some ostomy bags have a built-in gas release valve. Ostomy deodorizer (5 drops) can be put into the pouch to prevent odor. Some people use ostomy lubricant drops inside the pouch to help the stool slide out of the bag more easily and completely.  EMPTYING YOUR OSTOMY POUCH  You may get lessons on how to empty your pouch from a wound-ostomy nurse before you leave the hospital. Here are the basic steps:  Wash your hands with soap and water.  Sit far back on the toilet.  Put several pieces of toilet paper into the toilet water. This will prevent splashing as you empty the stool into the toilet bowl.  Unclip or unvelcro the tail end of the pouch.  Unroll the tail and empty stool into the toilet.  Clean the tail with toilet paper.  Reroll the tail, and clip or velcro it closed.  Wash your hands again. CHANGING YOUR OSTOMY POUCH  Change your ostomy pouch about every 3 to 4 days for the first 6 weeks, then every 5 to7 days. Always change the bag sooner if there is any leakage or you begin to notice any discomfort or irritation of the skin around the stoma. When possible, plan to change your ostomy  pouch before eating or drinking as this will lessen the chance of stool coming out during the pouch change. A wound-ostomy nurse may teach you how to change your pouch before you leave the hospital. Here are the basic steps:  Lay out your supplies.  Wash your hands with soap and water.  Carefully remove the old pouch.  Wash the stoma and allow it to dry. Men may be advised  to shave any hair around the stoma very carefully. This will make the adhesive stick better.  Use the stoma measuring guide that comes with your pouch set to decide what size hole you will need to cut in the skin barrier piece. Choose the smallest possible size that will hold the stoma but will not touch it.  Use the guide to trace the circle on the back of the skin barrier piece. Cut out the hole.  Hold the skin barrier piece over the stoma to make sure the hole is the correct size.  Remove the adhesive paper backing from the skin barrier piece.  Squeeze stoma paste around the opening of the skin barrier piece.  Clean and dry the skin around the stoma again.  Carefully fit the skin barrier piece over your stoma.  If you are using a two-piece pouch, snap the pouch onto the skin barrier piece.  Close the tail of the pouch.  Put your hand over the top of the skin barrier piece to help warm it for about 5 minutes, so that it conforms to your body better.  Wash your hands again. DIET TIPS   Continue to follow your usual diet.  Drink about eight 8 oz glasses of water each day.  You can prevent gas by eating slowly and chewing your food thoroughly.  If you feel concerned that you have too much gas, you can cut back on gas-producing foods, such as:  Spicy foods.  Onions and garlic.  Cruciferous vegetables (cabbage, broccoli, cauliflower, Brussels sprouts).  Beans and legumes.  Some cheeses.  Eggs.  Fish.  Bubbly (carbonated) drinks.  Chewing gum. GENERAL TIPS   You can shower with or without the  bag in place.  Always keep the bag on if you are bathing or swimming.  If your bag gets wet, you can dry it with a blow-dryer set to cool.  Avoid wearing tight clothing directly over your stoma so that it does not become irritated or bleed. Tight clothing can also prevent stool from draining into the pouch.  It is helpful to always have an extra skin barrier and pouch with you when traveling. Do not leave them anywhere too warm, as parts of them can melt.  Do not let your seat belt rest on your stoma. Try to keep the seat belt either above or below your stoma, or use a tiny pillow to cushion it.  You can still participate in sports, but you should avoid activities in which there is a risk of getting hit in the abdomen.  You can still have sex. It is a good idea to empty your pouch prior to sex. Some people and their partners feel very comfortable seeing the pouch during sex. Others choose to wear lingerie or a T-shirt that covers the device. SEEK IMMEDIATE MEDICAL CARE IF:  You notice a change in the size or color of the stoma, especially if it becomes very red, purple, black, or pale white.  You have bloody stools or bleeding from the stoma.  You have abdominal pain, nausea, vomiting, or bloating.  There is anything unusual protruding from the stoma.  You have irritation or red skin around the stoma.  No stool is passing from the stoma.  You have diarrhea (requiring more frequent than normal pouch emptying). Document Released: 05/28/2003 Document Revised: 08/17/2011 Document Reviewed: 10/22/2010 [facility] Patient Information 2015 [place], [place]. This information is not intended to replace advice given to you by your health care provider. Make  sure you discuss any questions you have with your health care provider.

## 2014-12-05 ENCOUNTER — Non-Acute Institutional Stay (SKILLED_NURSING_FACILITY): Payer: Medicare Other | Admitting: Internal Medicine

## 2014-12-05 ENCOUNTER — Encounter: Payer: Self-pay | Admitting: Internal Medicine

## 2014-12-05 DIAGNOSIS — M545 Low back pain, unspecified: Secondary | ICD-10-CM

## 2014-12-05 DIAGNOSIS — E871 Hypo-osmolality and hyponatremia: Secondary | ICD-10-CM | POA: Diagnosis not present

## 2014-12-05 DIAGNOSIS — K5720 Diverticulitis of large intestine with perforation and abscess without bleeding: Secondary | ICD-10-CM | POA: Diagnosis not present

## 2014-12-05 DIAGNOSIS — I509 Heart failure, unspecified: Secondary | ICD-10-CM | POA: Diagnosis not present

## 2014-12-05 DIAGNOSIS — E119 Type 2 diabetes mellitus without complications: Secondary | ICD-10-CM | POA: Diagnosis not present

## 2014-12-05 DIAGNOSIS — E785 Hyperlipidemia, unspecified: Secondary | ICD-10-CM

## 2014-12-05 DIAGNOSIS — I110 Hypertensive heart disease with heart failure: Secondary | ICD-10-CM | POA: Diagnosis not present

## 2014-12-05 DIAGNOSIS — I25810 Atherosclerosis of coronary artery bypass graft(s) without angina pectoris: Secondary | ICD-10-CM

## 2014-12-05 DIAGNOSIS — I5032 Chronic diastolic (congestive) heart failure: Secondary | ICD-10-CM

## 2014-12-05 NOTE — Progress Notes (Signed)
MRN: [id number] Name: AARRON WIERZBICKI  Sex: male Age: 81 y.o. DOB: 1926-08-16  [place] #: [facility] Facility/Room:112 Level Of Care: SNF Provider: Inocencio Homes D Emergency Contacts: Extended Emergency Contact Information Primary Emergency Contact: Madden,Toni Address: [address]          [place], [place] [address] Johnnette Litter of [place] Phone: [phone] Work Phone: [phone] Mobile Phone: [phone] Relation: Daughter Secondary Emergency Contact: Palma Holter [place] of [place] Phone: [phone] Mobile Phone: [phone] Relation: Daughter  Code Status:   Allergies: Actos; Celebrex; Demerol; Morphine and related; Ciprofloxacin; Metformin; and Zocor  Chief Complaint  Patient presents with  . New Admit To SNF    HPI: Patient is 81 y.o. male who is admitted to SNF for general weakness and healing of open abdominal wound  2/2 to surgery for perforated diverticulum.  Past Medical History  Diagnosis Date  . LACTOSE INTOLERANCE   . OBESITY   . CARDIOMYOPATHY, ISCHEMIC   . AORTIC SCLEROSIS   . SICK SINUS/ TACHY-BRADY SYNDROME 09/2007    s/p PPM st judes  . PERIPHERAL VASCULAR DISEASE   . CAROTID BRUIT, RIGHT 02/27/2008  . IBS (irritable bowel syndrome)   . ALLERGIC RHINITIS   . ANEMIA-NOS   . OA (osteoarthritis)   . COPD   . GERD   . HYPERLIPIDEMIA   . HIATAL HERNIA   . Diverticulosis   . Prostate cancer     seed implants 2004  . DIABETES MELLITUS-TYPE II     diet controlled  . CORONARY ARTERY DISEASE     CABG 1995, PTCA/DES 2008, 2009 and 08/2010  . HYPERTENSION   . Asthma   . Sleep apnea   . Partial small bowel obstruction   . Primary hyperparathyroidism     Lab Results Component Value Date  PTH 150.7* 02/13/2013  CALCIUM 11.0* 02/13/2013  CAION 1.21 03/15/2008    . SMALL BOWEL OBSTRUCTION 04/18/2009    Qualifier: History of  By: Asa Lente MD, Jannifer Rodney Cataract     surgery  . Symptomatic diverticulosis 01/18/2009    Qualifier:  Diagnosis of  By: Trellis Paganini PA-c, Amy S   . Hx of echocardiogram     Echo (9/15):  Mild LVH, EF 50-55%, no RWMA, Gr 1 DD, MAC, mild LAE.  . Diastolic dysfunction, Grade 1 11/24/2014  . Diverticulitis of colon with perforation 11/22/2014  . Hyponatremia 11/22/2014    Past Surgical History  Procedure Laterality Date  . Ptca  2008, 2009, 2012    with DES  . Partial small bowel obstruction  2009  . Bilateral cataracts    . Pacemaker insertion      DDD/[employer]         Last interrogation 2/13  on chart     Pacemaker guideline order Dr Tamala Julian on chart  . Inguinal hernia repair Bilateral   . Lumbar disc surgery  12/2008  . Coronary artery bypass graft    . Total hip arthroplasty  08/21/2011    Procedure: TOTAL HIP ARTHROPLASTY;  Surgeon: Johnn Hai, MD;  Location: [facility];  Service: Orthopedics;  Laterality: Right;  . Colonoscopy    . Esophagogastroduodenoscopy  multiple  . Penile prosthesis placement    . Back surgery    . Colon surgery    . Flexible sigmoidoscopy N/A 09/22/2013    Procedure: FLEXIBLE SIGMOIDOSCOPY;  Surgeon: Gatha Mayer, MD;  Location: [facility];  Service: Endoscopy;  Laterality: N/A;  . Colon resection N/A  11/28/2014    Procedure: EXPLORATORY LAPAROTOMY, SIGMOID COLECTOMY WITH COLOSTOMY;  Surgeon: Jackolyn Confer, MD;  Location: [facility];  Service: General;  Laterality: N/A;      Medication List       This list is accurate as of: 12/05/14 11:59 PM.  Always use your most recent med list.               amoxicillin-clavulanate 875-125 MG per tablet  Commonly known as:  AUGMENTIN  Take 1 tablet by mouth every 12 (twelve) hours.     aspirin EC 81 MG tablet  Take 1 tablet (81 mg total) by mouth daily.     chlorthalidone 25 MG tablet  Commonly known as:  HYGROTON  Take 25 mg by mouth daily.     feeding supplement (RESOURCE BREEZE) Liqd  Take 1 Container by mouth 2 (two) times daily between meals.     fluticasone 50 MCG/ACT nasal spray  Commonly  known as:  FLONASE  Place 2 sprays into both nostrils daily. Allergies     HYDROcodone-acetaminophen 5-325 MG per tablet  Commonly known as:  NORCO/VICODIN  Take 1-2 tablets by mouth every 4 (four) hours as needed for moderate pain.     isosorbide mononitrate 30 MG 24 hr tablet  Commonly known as:  IMDUR  Take 1 tablet (30 mg total) by mouth every evening.     nitroGLYCERIN 0.4 MG SL tablet  Commonly known as:  NITROSTAT  Place 0.4 mg under the tongue every 5 (five) minutes as needed for chest pain. Chest pain     pravastatin 20 MG tablet  Commonly known as:  PRAVACHOL  Take 3 tablets (60 mg total) by mouth daily.     PROVENTIL HFA 108 (90 BASE) MCG/ACT inhaler  Generic drug:  albuterol  Inhale 2 puffs into the lungs every 4 (four) hours as needed for shortness of breath. Wheezing     albuterol (2.5 MG/3ML) 0.083% nebulizer solution  Commonly known as:  PROVENTIL  USE 1 VIAL WITH NEBULIZER EVERY 4 HOURS AS NEEDED FOR WHEEZING     SYSTANE 0.4-0.3 % Soln  Generic drug:  Polyethyl Glycol-Propyl Glycol  Apply 2 drops to eye 4 (four) times daily.     valsartan 160 MG tablet  Commonly known as:  DIOVAN  Take 1 tablet (160 mg total) by mouth daily.        No orders of the defined types were placed in this encounter.    Immunization History  Administered Date(s) Administered  . Influenza Split 06/09/2011, 03/01/2012, 04/08/2014  . Influenza,inj,Quad PF,36+ Mos 02/02/2013, 01/23/2014  . Pneumococcal Conjugate-13 05/23/2014  . Pneumococcal Polysaccharide-23 02/27/2008  . Td 08/07/2008    History  Substance Use Topics  . Smoking status: Former Smoker    Quit date: 06/09/1995  . Smokeless tobacco: Never Used  . Alcohol Use: No    Family history is noncontributory    Review of Systems  DATA OBTAINED: from patient, nurse GENERAL:  no fevers SKIN: No itching, rash; wound is some pain but better EYES: No eye pain, redness, discharge EARS: No earache, tinnitus, change  in hearing NOSE: No congestion, drainage or bleeding  MOUTH/THROAT: No mouth or tooth pain, No sore throat RESPIRATORY: No cough, wheezing, SOB CARDIAC: No chest pain, palpitations, lower extremity edema  GI: No abdominal pain, No N/V/D or constipation, No heartburn or reflux  GU: No dysuria, frequency or urgency, or incontinence  MUSCULOSKELETAL: LBP NEUROLOGIC: No headache, dizziness  PSYCHIATRIC: No overt anxiety  or sadness, No behavior issue.   Filed Vitals:   12/05/14 2019  BP: 112/62  Pulse: 70  Temp: 99.1 F (37.3 C)  Resp: 20    Physical Exam  GENERAL APPEARANCE: Alert, conversant,  No acute distress.  SKIN: No diaphoresis rash; dressing in place large abd wound HEAD: Normocephalic, atraumatic  EYES: Conjunctiva/lids clear. Pupils round, reactive. EOMs intact.  EARS: External exam WNL, canals clear. Hearing grossly normal.  NOSE: No deformity or discharge.  MOUTH/THROAT: Lips w/o lesions  RESPIRATORY: Breathing is even, unlabored. Lung sounds are clear   CARDIOVASCULAR: Heart RRR no murmurs, rubs or gallops. No peripheral edema.   GASTROINTESTINAL: Abdomen is soft, non-tender, not distended w/ normal bowel sounds. GENITOURINARY: Bladder non tender, not distended  MUSCULOSKELETAL: No abnormal joints or musculature NEUROLOGIC:  Cranial nerves 2-12 grossly intact. Moves all extremities  PSYCHIATRIC: Mood and affect appropriate to situation, no behavioral issues  Patient Active Problem List   Diagnosis Date Noted  . Back pain 12/10/2014  . Obesity (BMI 30-39.9) 12/08/2014  . Hartmann's pouch of intestine 12/08/2014  . HCAP (healthcare-associated pneumonia) 12/07/2014  . Open wound anterior abdominal wall   . CAD (coronary artery disease) of artery bypass graft 11/28/2014  . Chronic diastolic heart failure 01/75/1025  . Leukocytosis 11/22/2014  . Hyponatremia 11/22/2014  . Diverticulitis of colon with perforation 11/22/2014  . GERD (gastroesophageal reflux disease)  03/27/2014  . Pacemaker 04/10/2013  . Type 2 diabetes mellitus, controlled 01/18/2009  . Dyslipidemia 01/18/2009  . Hypertensive heart disease with CHF 01/18/2009  . Coronary atherosclerosis 01/18/2009  . GERD 01/18/2009    CBC    Component Value Date/Time   WBC 13.0* 12/10/2014 0530   WBC 7.7 04/11/2014   RBC 3.22* 12/10/2014 0530   HGB 10.1* 12/10/2014 0530   HCT 31.7* 12/10/2014 0530   PLT 331 12/10/2014 0530   MCV 98.4 12/10/2014 0530   LYMPHSABS 1.1 12/08/2014 0350   MONOABS 1.2* 12/08/2014 0350   EOSABS 0.2 12/08/2014 0350   BASOSABS 0.0 12/08/2014 0350    CMP     Component Value Date/Time   NA 134* 12/10/2014 0530   NA 135* 04/11/2014   K 3.8 12/10/2014 0530   CL 99* 12/10/2014 0530   CO2 27 12/10/2014 0530   GLUCOSE 121* 12/10/2014 0530   BUN 21* 12/10/2014 0530   BUN 11 04/11/2014   CREATININE 1.21 12/10/2014 0530   CREATININE 1.0 04/11/2014   CALCIUM 10.3 12/10/2014 0530   PROT 6.3* 12/08/2014 0350   ALBUMIN 3.1* 12/08/2014 0350   AST 18 12/08/2014 0350   ALT 18 12/08/2014 0350   ALKPHOS 62 12/08/2014 0350   BILITOT 0.7 12/08/2014 0350   GFRNONAA 53* 12/10/2014 0530   GFRAA >60 12/10/2014 0530    Assessment and Plan  Diverticulitis of colon with perforation Has been on Zosyn, antibiotics were changed to Invanz by surgery 11/26/14. - Repeat CT scan 11/26/14 showed worsening free air - Pt had been refusing surgery early during this admission, however ultimately agreed with and underwent exlap with sigmoid colectomy and descending colostomy on 6/22 after symptoms continued to worsen - Pt remained remained with generalized post-operative pain that is gradually improving - Ileus resolved. - Patient was able to advance to soft per General Surgery - Patient's antibiotc was transitioned to augmentin on 6/28 to complete course after finishing 6/29 dose   Chronic diastolic heart failure - Mild, remained stable thus far  Hyponatremia - Mild, remained  stable thus far  Type 2 diabetes  mellitus, controlled A1c about 7 months ago was 6.9 indicating good glycemic control. - Continued sliding scale insulin while inpatient   Dyslipidemia resume statin therapy   Hypertensive heart disease with CHF Remained stable - Patient to resume home BP meds on discharge  CAD (coronary artery disease) of artery bypass graft ASA to be resumed on d/c  Back pain - Suspect musculoskeletal from prolonged bedrest - Pt has known hx of chronic back pain - lumbar xray is notable for significant degenerative changes. - Continue with analgesics and PT as tolerated    Hennie Duos, MD

## 2014-12-07 ENCOUNTER — Emergency Department (HOSPITAL_COMMUNITY): Payer: Medicare Other

## 2014-12-07 ENCOUNTER — Encounter: Payer: Self-pay | Admitting: Internal Medicine

## 2014-12-07 ENCOUNTER — Encounter (HOSPITAL_COMMUNITY): Payer: Self-pay | Admitting: Nurse Practitioner

## 2014-12-07 ENCOUNTER — Non-Acute Institutional Stay (SKILLED_NURSING_FACILITY): Payer: Medicare Other | Admitting: Internal Medicine

## 2014-12-07 ENCOUNTER — Inpatient Hospital Stay (HOSPITAL_COMMUNITY)
Admission: EM | Admit: 2014-12-07 | Discharge: 2014-12-12 | DRG: 194 | Disposition: A | Discharge status: Home / Self Care | Payer: Medicare Other | Attending: Internal Medicine | Admitting: Internal Medicine

## 2014-12-07 DIAGNOSIS — Z87891 Personal history of nicotine dependence: Secondary | ICD-10-CM | POA: Diagnosis not present

## 2014-12-07 DIAGNOSIS — E785 Hyperlipidemia, unspecified: Secondary | ICD-10-CM | POA: Diagnosis present

## 2014-12-07 DIAGNOSIS — Z951 Presence of aortocoronary bypass graft: Secondary | ICD-10-CM

## 2014-12-07 DIAGNOSIS — M1990 Unspecified osteoarthritis, unspecified site: Secondary | ICD-10-CM | POA: Diagnosis present

## 2014-12-07 DIAGNOSIS — J449 Chronic obstructive pulmonary disease, unspecified: Secondary | ICD-10-CM | POA: Diagnosis present

## 2014-12-07 DIAGNOSIS — D72829 Elevated white blood cell count, unspecified: Secondary | ICD-10-CM | POA: Diagnosis not present

## 2014-12-07 DIAGNOSIS — Z96641 Presence of right artificial hip joint: Secondary | ICD-10-CM | POA: Diagnosis present

## 2014-12-07 DIAGNOSIS — I5032 Chronic diastolic (congestive) heart failure: Secondary | ICD-10-CM | POA: Diagnosis present

## 2014-12-07 DIAGNOSIS — Z950 Presence of cardiac pacemaker: Secondary | ICD-10-CM

## 2014-12-07 DIAGNOSIS — Z933 Colostomy status: Secondary | ICD-10-CM | POA: Diagnosis not present

## 2014-12-07 DIAGNOSIS — G4730 Sleep apnea, unspecified: Secondary | ICD-10-CM | POA: Diagnosis present

## 2014-12-07 DIAGNOSIS — J189 Pneumonia, unspecified organism: Secondary | ICD-10-CM | POA: Diagnosis not present

## 2014-12-07 DIAGNOSIS — Z6833 Body mass index (BMI) 33.0-33.9, adult: Secondary | ICD-10-CM | POA: Diagnosis not present

## 2014-12-07 DIAGNOSIS — Y95 Nosocomial condition: Secondary | ICD-10-CM | POA: Diagnosis present

## 2014-12-07 DIAGNOSIS — I110 Hypertensive heart disease with heart failure: Secondary | ICD-10-CM | POA: Diagnosis not present

## 2014-12-07 DIAGNOSIS — N281 Cyst of kidney, acquired: Secondary | ICD-10-CM | POA: Diagnosis not present

## 2014-12-07 DIAGNOSIS — I255 Ischemic cardiomyopathy: Secondary | ICD-10-CM | POA: Diagnosis present

## 2014-12-07 DIAGNOSIS — Z881 Allergy status to other antibiotic agents status: Secondary | ICD-10-CM | POA: Diagnosis not present

## 2014-12-07 DIAGNOSIS — K5720 Diverticulitis of large intestine with perforation and abscess without bleeding: Secondary | ICD-10-CM | POA: Diagnosis not present

## 2014-12-07 DIAGNOSIS — E119 Type 2 diabetes mellitus without complications: Secondary | ICD-10-CM

## 2014-12-07 DIAGNOSIS — M6281 Muscle weakness (generalized): Secondary | ICD-10-CM | POA: Diagnosis not present

## 2014-12-07 DIAGNOSIS — K219 Gastro-esophageal reflux disease without esophagitis: Secondary | ICD-10-CM | POA: Diagnosis present

## 2014-12-07 DIAGNOSIS — E210 Primary hyperparathyroidism: Secondary | ICD-10-CM | POA: Diagnosis present

## 2014-12-07 DIAGNOSIS — E669 Obesity, unspecified: Secondary | ICD-10-CM | POA: Diagnosis present

## 2014-12-07 DIAGNOSIS — R609 Edema, unspecified: Secondary | ICD-10-CM | POA: Diagnosis not present

## 2014-12-07 DIAGNOSIS — J45909 Unspecified asthma, uncomplicated: Secondary | ICD-10-CM | POA: Diagnosis present

## 2014-12-07 DIAGNOSIS — Z8546 Personal history of malignant neoplasm of prostate: Secondary | ICD-10-CM | POA: Diagnosis not present

## 2014-12-07 DIAGNOSIS — I5030 Unspecified diastolic (congestive) heart failure: Secondary | ICD-10-CM | POA: Diagnosis not present

## 2014-12-07 DIAGNOSIS — Z888 Allergy status to other drugs, medicaments and biological substances status: Secondary | ICD-10-CM

## 2014-12-07 DIAGNOSIS — Z7982 Long term (current) use of aspirin: Secondary | ICD-10-CM

## 2014-12-07 DIAGNOSIS — R2681 Unsteadiness on feet: Secondary | ICD-10-CM | POA: Diagnosis not present

## 2014-12-07 DIAGNOSIS — D638 Anemia in other chronic diseases classified elsewhere: Secondary | ICD-10-CM | POA: Diagnosis present

## 2014-12-07 DIAGNOSIS — Z885 Allergy status to narcotic agent status: Secondary | ICD-10-CM | POA: Diagnosis not present

## 2014-12-07 DIAGNOSIS — I2510 Atherosclerotic heart disease of native coronary artery without angina pectoris: Secondary | ICD-10-CM | POA: Diagnosis present

## 2014-12-07 DIAGNOSIS — I739 Peripheral vascular disease, unspecified: Secondary | ICD-10-CM | POA: Diagnosis present

## 2014-12-07 DIAGNOSIS — N3289 Other specified disorders of bladder: Secondary | ICD-10-CM | POA: Diagnosis not present

## 2014-12-07 DIAGNOSIS — R109 Unspecified abdominal pain: Secondary | ICD-10-CM

## 2014-12-07 DIAGNOSIS — K5741 Diverticulitis of both small and large intestine with perforation and abscess with bleeding: Secondary | ICD-10-CM | POA: Diagnosis not present

## 2014-12-07 DIAGNOSIS — Z9049 Acquired absence of other specified parts of digestive tract: Secondary | ICD-10-CM | POA: Diagnosis present

## 2014-12-07 DIAGNOSIS — I519 Heart disease, unspecified: Secondary | ICD-10-CM | POA: Diagnosis not present

## 2014-12-07 DIAGNOSIS — R0602 Shortness of breath: Secondary | ICD-10-CM | POA: Diagnosis not present

## 2014-12-07 DIAGNOSIS — K7689 Other specified diseases of liver: Secondary | ICD-10-CM | POA: Diagnosis not present

## 2014-12-07 DIAGNOSIS — R488 Other symbolic dysfunctions: Secondary | ICD-10-CM | POA: Diagnosis not present

## 2014-12-07 DIAGNOSIS — S31109A Unspecified open wound of abdominal wall, unspecified quadrant without penetration into peritoneal cavity, initial encounter: Secondary | ICD-10-CM

## 2014-12-07 DIAGNOSIS — R079 Chest pain, unspecified: Secondary | ICD-10-CM | POA: Diagnosis not present

## 2014-12-07 DIAGNOSIS — I10 Essential (primary) hypertension: Secondary | ICD-10-CM | POA: Diagnosis not present

## 2014-12-07 LAB — URINALYSIS, ROUTINE W REFLEX MICROSCOPIC
BILIRUBIN URINE: NEGATIVE
GLUCOSE, UA: NEGATIVE mg/dL
Hgb urine dipstick: NEGATIVE
KETONES UR: NEGATIVE mg/dL
Leukocytes, UA: NEGATIVE
NITRITE: NEGATIVE
PROTEIN: NEGATIVE mg/dL
SPECIFIC GRAVITY, URINE: 1.009 (ref 1.005–1.030)
Urobilinogen, UA: 0.2 mg/dL (ref 0.0–1.0)
pH: 7.5 (ref 5.0–8.0)

## 2014-12-07 LAB — BASIC METABOLIC PANEL
ANION GAP: 8 (ref 5–15)
BUN: 11 mg/dL (ref 6–20)
CHLORIDE: 99 mmol/L — AB (ref 101–111)
CO2: 28 mmol/L (ref 22–32)
Calcium: 10.3 mg/dL (ref 8.9–10.3)
Creatinine, Ser: 1.01 mg/dL (ref 0.61–1.24)
GFR calc Af Amer: >60 mL/min (ref >60)
Glucose, Bld: 127 mg/dL — ABNORMAL HIGH (ref 65–99)
POTASSIUM: 4.1 mmol/L (ref 3.5–5.1)
Sodium: 135 mmol/L (ref 135–145)

## 2014-12-07 LAB — CBC WITH DIFFERENTIAL/PLATELET
BASOS PCT: 0 % (ref 0–1)
Basophils Absolute: 0.1 10*3/uL (ref 0.0–0.1)
EOS PCT: 1 % (ref 0–5)
Eosinophils Absolute: 0.2 10*3/uL (ref 0.0–0.7)
HCT: 33.5 % — ABNORMAL LOW (ref 39.0–52.0)
Hemoglobin: 10.8 g/dL — ABNORMAL LOW (ref 13.0–17.0)
LYMPHS PCT: 6 % — AB (ref 12–46)
Lymphs Abs: 1.1 10*3/uL (ref 0.7–4.0)
MCH: 31.7 pg (ref 26.0–34.0)
MCHC: 32.2 g/dL (ref 30.0–36.0)
MCV: 98.2 fL (ref 78.0–100.0)
MONO ABS: 1.6 10*3/uL — AB (ref 0.1–1.0)
Monocytes Relative: 9 % (ref 3–12)
NEUTROS PCT: 84 % — AB (ref 43–77)
Neutro Abs: 14.5 10*3/uL — ABNORMAL HIGH (ref 1.7–7.7)
PLATELETS: 267 10*3/uL (ref 150–400)
RBC: 3.41 MIL/uL — ABNORMAL LOW (ref 4.22–5.81)
RDW: 12.4 % (ref 11.5–15.5)
WBC: 17.3 10*3/uL — AB (ref 4.0–10.5)

## 2014-12-07 LAB — I-STAT CG4 LACTIC ACID, ED: LACTIC ACID, VENOUS: 1.41 mmol/L (ref 0.5–2.0)

## 2014-12-07 MED ORDER — ONDANSETRON HCL 4 MG/2ML IJ SOLN
4.0000 mg | Freq: Once | INTRAMUSCULAR | Status: AC
  Filled 2014-12-07: qty 2

## 2014-12-07 MED ORDER — MORPHINE SULFATE 2 MG/ML IJ SOLN: 2.0000 mg | INTRAMUSCULAR | Status: DC | PRN

## 2014-12-07 MED ORDER — FENTANYL CITRATE (PF) 100 MCG/2ML IJ SOLN
25.0000 ug | Freq: Once | INTRAMUSCULAR | Status: AC
  Administered 2014-12-07: 25 ug via INTRAVENOUS
  Filled 2014-12-07: qty 2

## 2014-12-07 MED ORDER — IOHEXOL 300 MG/ML  SOLN
100.0000 mL | Freq: Once | INTRAMUSCULAR | Status: AC | PRN
  Administered 2014-12-07: 100 mL via INTRAVENOUS

## 2014-12-07 MED ORDER — IOHEXOL 300 MG/ML  SOLN
25.0000 mL | Freq: Once | INTRAMUSCULAR | Status: AC | PRN
  Administered 2014-12-07: 25 mL via ORAL

## 2014-12-07 MED ORDER — ONDANSETRON HCL 4 MG/2ML IJ SOLN
4.0000 mg | Freq: Once | INTRAMUSCULAR | Status: AC
  Administered 2014-12-07: 4 mg via INTRAVENOUS

## 2014-12-07 MED ORDER — SODIUM CHLORIDE 0.9 % IV SOLN
Freq: Once | INTRAVENOUS | Status: AC
  Administered 2014-12-07: 19:00:00 via INTRAVENOUS

## 2014-12-07 NOTE — ED Notes (Signed)
Pt is presented from [facility], he has a 12 cm by 6 cm post op dehisced wound RLQ, had the surgery last week dx on the 28 th, assessing wound there is obvious sign of a recent wound change wet to dry which pt endorses that facility wound nurse did this morning, they sent him for further evaluation stating that there is increased pain and swelling to the site.

## 2014-12-07 NOTE — ED Notes (Signed)
Patient transported to CT 

## 2014-12-07 NOTE — ED Notes (Signed)
Bed: HF41 Expected date:  Expected time:  Means of arrival:  Comments: EMS-surgical site pain

## 2014-12-07 NOTE — Consult Note (Signed)
Reason for Consult: wound drainage, increased WBD Referring Physician: Dr Hubbard Hartshorn is an 81 y.o. male.  HPI: 81 yo WM just discharged on June 28 to skilled nursing facility for recovery from perforated sigmoid diverticulitis. He was admitted on June 15. He was initially medically managed and was hesitant to undergo surgery. His condition worsened on June 22. He underwent exploratory laparotomy with sigmoid colectomy with end colostomy. His midline incision was left open to heal by secondary intention. Earlier today at the nursing home apparently there was concern about change in the status of his midline incision. There is by report increased greenish drainage and some sloughing of tissue & subjective increased pain he was brought to the emergency room for evaluation. The patient denies any fever, chills, nausea, vomiting, dysuria. He complains about the wound care he got at the skilled nursing facility. He states that the wound was not being changed at least twice a day. He also states that they were not helping him get up out of bed with assistance. He denies any chest pain or chest pressure.  His white count has increased to 17,000. It was around 11,000 on discharge. I was asked to see the patient. I also requested a CT scan since his white blood cell count has increased since discharge  Past Medical History  Diagnosis Date  . LACTOSE INTOLERANCE   . OBESITY   . CARDIOMYOPATHY, ISCHEMIC   . AORTIC SCLEROSIS   . SICK SINUS/ TACHY-BRADY SYNDROME 09/2007    s/p PPM st judes  . PERIPHERAL VASCULAR DISEASE   . CAROTID BRUIT, RIGHT 02/27/2008  . IBS (irritable bowel syndrome)   . ALLERGIC RHINITIS   . ANEMIA-NOS   . OA (osteoarthritis)   . COPD   . GERD   . HYPERLIPIDEMIA   . HIATAL HERNIA   . Diverticulosis   . Prostate cancer     seed implants 2004  . DIABETES MELLITUS-TYPE II     diet controlled  . CORONARY ARTERY DISEASE     CABG 1995, PTCA/DES 2008, 2009 and 08/2010  .  HYPERTENSION   . Asthma   . Sleep apnea   . Partial small bowel obstruction   . Primary hyperparathyroidism     Lab Results Component Value Date  PTH 150.7* 02/13/2013  CALCIUM 11.0* 02/13/2013  CAION 1.21 03/15/2008    . SMALL BOWEL OBSTRUCTION 04/18/2009    Qualifier: History of  By: Asa Lente MD, Jannifer Rodney Cataract     surgery  . Symptomatic diverticulosis 01/18/2009    Qualifier: Diagnosis of  By: Trellis Paganini PA-c, Amy S   . Hx of echocardiogram     Echo (9/15):  Mild LVH, EF 50-55%, no RWMA, Gr 1 DD, MAC, mild LAE.  . Diastolic dysfunction, Grade 1 11/24/2014  . Diverticulitis of colon with perforation 11/22/2014  . Hyponatremia 11/22/2014    Past Surgical History  Procedure Laterality Date  . Ptca  2008, 2009, 2012    with DES  . Partial small bowel obstruction  2009  . Bilateral cataracts    . Pacemaker insertion      DDD/[employer]         Last interrogation 2/13  on chart     Pacemaker guideline order Dr Tamala Julian on chart  . Inguinal hernia repair Bilateral   . Lumbar disc surgery  12/2008  . Coronary artery bypass graft    . Total hip arthroplasty  08/21/2011    Procedure: TOTAL  HIP ARTHROPLASTY;  Surgeon: Johnn Hai, MD;  Location: [facility];  Service: Orthopedics;  Laterality: Right;  . Colonoscopy    . Esophagogastroduodenoscopy  multiple  . Penile prosthesis placement    . Back surgery    . Colon surgery    . Flexible sigmoidoscopy N/A 09/22/2013    Procedure: FLEXIBLE SIGMOIDOSCOPY;  Surgeon: Gatha Mayer, MD;  Location: [facility];  Service: Endoscopy;  Laterality: N/A;  . Colon resection N/A 11/28/2014    Procedure: EXPLORATORY LAPAROTOMY, SIGMOID COLECTOMY WITH COLOSTOMY;  Surgeon: Jackolyn Confer, MD;  Location: [facility];  Service: General;  Laterality: N/A;    Family History  Problem Relation Age of Onset  . Colon cancer Neg Hx   . Hypertension Mother   . Heart attack    . Heart attack    . Stroke Neg Hx   . Heart attack Brother   . Cancer Mother      Social History:  reports that he quit smoking about 19 years ago. He has never used smokeless tobacco. He reports that he does not drink alcohol or use illicit drugs.  Allergies:  Allergies  Allergen Reactions  . Actos [Pioglitazone Hydrochloride] Other (See Comments)    "felt funny, drowsy, and weak":  . Celebrex [Celecoxib] Other (See Comments)    "felt funny"  . Demerol Palpitations and Other (See Comments)    Increased BP  . Morphine And Related Nausea And Vomiting  . Ciprofloxacin Other (See Comments)    arthralgia  . Metformin Nausea And Vomiting  . Zocor [Simvastatin] Other (See Comments)    Makes pt very drowsy    Medications: I have reviewed the patient's current medications.  Results for orders placed or performed during the hospital encounter of 12/07/14 (from the past 48 hour(s))  CBC with Differential/Platelet     Status: Abnormal   Collection Time: 12/07/14  5:32 PM  Result Value Ref Range   WBC 17.3 (H) 4.0 - 10.5 K/uL   RBC 3.41 (L) 4.22 - 5.81 MIL/uL   Hemoglobin 10.8 (L) 13.0 - 17.0 g/dL   HCT 33.5 (L) 39.0 - 52.0 %   MCV 98.2 78.0 - 100.0 fL   MCH 31.7 26.0 - 34.0 pg   MCHC 32.2 30.0 - 36.0 g/dL   RDW 12.4 11.5 - 15.5 %   Platelets 267 150 - 400 K/uL   Neutrophils Relative % 84 (H) 43 - 77 %   Neutro Abs 14.5 (H) 1.7 - 7.7 K/uL   Lymphocytes Relative 6 (L) 12 - 46 %   Lymphs Abs 1.1 0.7 - 4.0 K/uL   Monocytes Relative 9 3 - 12 %   Monocytes Absolute 1.6 (H) 0.1 - 1.0 K/uL   Eosinophils Relative 1 0 - 5 %   Eosinophils Absolute 0.2 0.0 - 0.7 K/uL   Basophils Relative 0 0 - 1 %   Basophils Absolute 0.1 0.0 - 0.1 K/uL  Basic metabolic panel     Status: Abnormal   Collection Time: 12/07/14  5:32 PM  Result Value Ref Range   Sodium 135 135 - 145 mmol/L   Potassium 4.1 3.5 - 5.1 mmol/L   Chloride 99 (L) 101 - 111 mmol/L   CO2 28 22 - 32 mmol/L   Glucose, Bld 127 (H) 65 - 99 mg/dL   BUN 11 6 - 20 mg/dL   Creatinine, Ser 1.01 0.61 - 1.24 mg/dL    Calcium 10.3 8.9 - 10.3 mg/dL   GFR calc non Af Amer >  60 >60 mL/min   GFR calc Af Amer >60 >60 mL/min    Comment: (NOTE) The eGFR has been calculated using the CKD EPI equation. This calculation has not been validated in all clinical situations. eGFR's persistently <60 mL/min signify possible Chronic Kidney Disease.    Anion gap 8 5 - 15    No results found.  Review of Systems  Constitutional: Positive for chills. Negative for fever and weight loss.  HENT: Negative for nosebleeds.   Eyes: Negative for blurred vision.  Respiratory: Negative for shortness of breath.   Cardiovascular: Negative for chest pain, palpitations, orthopnea and PND.       Denies DOE  Gastrointestinal: Negative for nausea, vomiting, diarrhea and blood in stool.       Incisional pain  Genitourinary: Negative for dysuria and hematuria.  Musculoskeletal: Negative.   Skin: Negative for itching and rash.  Neurological: Positive for weakness. Negative for dizziness, focal weakness, seizures, loss of consciousness and headaches.       Denies TIAs, amaurosis fugax  Endo/Heme/Allergies: Does not bruise/bleed easily.  Psychiatric/Behavioral: The patient is not nervous/anxious.    Blood pressure 141/65, pulse 88, temperature 97.9 F (36.6 C), temperature source Oral, resp. rate 20, SpO2 97 %. Physical Exam  Vitals reviewed. Constitutional: He is oriented to person, place, and time. He appears well-developed and well-nourished. No distress.  Obese WM appears younger than stated age, nontoxic, seems a little depressed  HENT:  Head: Normocephalic and atraumatic.  Right Ear: External ear normal.  Left Ear: External ear normal.  Eyes: Conjunctivae are normal. No scleral icterus.  Neck: Normal range of motion. Neck supple. No tracheal deviation present. No thyromegaly present.  Cardiovascular: Normal rate and normal heart sounds.   Respiratory: Effort normal and breath sounds normal. No stridor. No respiratory  distress. He has no wheezes.  GI: Soft. He exhibits no distension. There is no rebound and no guarding.  Has open lower midline incision, fascial sutures visible and appear intact. Has some thin yellowish drainage from midline base. On right side of wound near skin surface, has about  4cm long section of black adipose just under skin. Ostomy LLQ - air/stool in bag, ostomy itself appears viable. Has tenderness around open midline wound  Musculoskeletal: He exhibits no edema or tenderness.  Lymphadenopathy:    He has no cervical adenopathy.  Neurological: He is alert and oriented to person, place, and time. He exhibits normal muscle tone.  Skin: Skin is warm and dry. No rash noted. He is not diaphoretic. No erythema. No pallor.  Old LE vein harvest scar; no pitting edema  Psychiatric: He has a normal mood and affect. His behavior is normal. Judgment and thought content normal.    Assessment/Plan: Sigmoid diverticulitis with perforation status post sigmoid colectomy with end colostomy June 22 by Dr. Zella Richer Diabetes mellitus type 2 Dyslipidemia Hypertension Pacemaker status Coronary artery disease Diastolic dysfunction grade 1 Leukocytosis Acute Anemia on chronic disease   On exam his fascia appears intact. I do not see any evidence of fascial dehiscence. His wound bed is entirely not clean but that is not unexpected. I have recommended a CT scan to further evaluate his leukocytosis. He could have developed an intra-abdominal abscess. Because of the time of day it is unlikely for him to be able to be transported back to the nursing home. I recommend admission. If his CAT scan is unremarkable and he can have a diet. If there is no sign of intra-abdominal infection or abscess  at do not believe he needs to be on anabiotic's per se. Right now I recommend continuing wet-to-dry dressing twice a day. I will discuss placement of a negative pressure wound VAC. Can have chemical DVT prophylaxis. PT-OT  consults  Leighton Ruff. Redmond Pulling, MD, FACS General, Bariatric, & Minimally Invasive Surgery [facility], [place]  [facility] M 12/07/2014, 8:12 PM

## 2014-12-07 NOTE — ED Provider Notes (Signed)
CSN: [id number]     Arrival date & time 12/07/14  1614 History   First MD Initiated Contact with Patient 12/07/14 1643     Chief Complaint  Patient presents with  . Post-op Problem  . Wound Dehiscence      HPI  Patient presents for evaluation of increasing pain of his abdominal wound.   Patient admitted on 6/15  with a diverticulitis with microperforation. Went to the OR  6/22 for a left colectomy, descending colostomy, and Hartman's pouch. Had dehiscence of his wound requiring wet-to-dry dressings on 6-23/24.  DC'd 6/29 (48 hours ago) to [facility].    Sent today out of concern for increased size and drainage and pain around his wound.  He reports it remains painful. He states that he is still putting out output through his ostomy. He denies nausea or vomiting.    Past Medical History  Diagnosis Date  . LACTOSE INTOLERANCE   . OBESITY   . CARDIOMYOPATHY, ISCHEMIC   . AORTIC SCLEROSIS   . SICK SINUS/ TACHY-BRADY SYNDROME 09/2007    s/p PPM st judes  . PERIPHERAL VASCULAR DISEASE   . CAROTID BRUIT, RIGHT 02/27/2008  . IBS (irritable bowel syndrome)   . ALLERGIC RHINITIS   . ANEMIA-NOS   . OA (osteoarthritis)   . COPD   . GERD   . HYPERLIPIDEMIA   . HIATAL HERNIA   . Diverticulosis   . Prostate cancer     seed implants 2004  . DIABETES MELLITUS-TYPE II     diet controlled  . CORONARY ARTERY DISEASE     CABG 1995, PTCA/DES 2008, 2009 and 08/2010  . HYPERTENSION   . Asthma   . Sleep apnea   . Partial small bowel obstruction   . Primary hyperparathyroidism     Lab Results Component Value Date  PTH 150.7* 02/13/2013  CALCIUM 11.0* 02/13/2013  CAION 1.21 03/15/2008    . SMALL BOWEL OBSTRUCTION 04/18/2009    Qualifier: History of  By: Asa Lente MD, Jannifer Rodney Cataract     surgery  . Symptomatic diverticulosis 01/18/2009    Qualifier: Diagnosis of  By: Trellis Paganini PA-c, Amy S   . Hx of echocardiogram     Echo (9/15):  Mild LVH, EF 50-55%, no RWMA, Gr 1 DD, MAC, mild LAE.  .  Diastolic dysfunction, Grade 1 11/24/2014  . Diverticulitis of colon with perforation 11/22/2014  . Hyponatremia 11/22/2014   Past Surgical History  Procedure Laterality Date  . Ptca  2008, 2009, 2012    with DES  . Partial small bowel obstruction  2009  . Bilateral cataracts    . Pacemaker insertion      DDD/[employer]         Last interrogation 2/13  on chart     Pacemaker guideline order Dr Tamala Julian on chart  . Inguinal hernia repair Bilateral   . Lumbar disc surgery  12/2008  . Coronary artery bypass graft    . Total hip arthroplasty  08/21/2011    Procedure: TOTAL HIP ARTHROPLASTY;  Surgeon: Johnn Hai, MD;  Location: [facility];  Service: Orthopedics;  Laterality: Right;  . Colonoscopy    . Esophagogastroduodenoscopy  multiple  . Penile prosthesis placement    . Back surgery    . Colon surgery    . Flexible sigmoidoscopy N/A 09/22/2013    Procedure: FLEXIBLE SIGMOIDOSCOPY;  Surgeon: Gatha Mayer, MD;  Location: [facility];  Service: Endoscopy;  Laterality: N/A;  .  Colon resection N/A 11/28/2014    Procedure: EXPLORATORY LAPAROTOMY, SIGMOID COLECTOMY WITH COLOSTOMY;  Surgeon: Jackolyn Confer, MD;  Location: [facility];  Service: General;  Laterality: N/A;   Family History  Problem Relation Age of Onset  . Colon cancer Neg Hx   . Hypertension Mother   . Heart attack    . Heart attack    . Stroke Neg Hx   . Heart attack Brother   . Cancer Mother    History  Substance Use Topics  . Smoking status: Former Smoker    Quit date: 06/09/1995  . Smokeless tobacco: Never Used  . Alcohol Use: No    Review of Systems  Constitutional: Negative for fever, chills, diaphoresis, appetite change and fatigue.  HENT: Negative for mouth sores, sore throat and trouble swallowing.   Eyes: Negative for visual disturbance.  Respiratory: Negative for cough, chest tightness, shortness of breath and wheezing.   Cardiovascular: Negative for chest pain.  Gastrointestinal: Positive for abdominal  pain. Negative for nausea, vomiting, diarrhea and abdominal distention.  Endocrine: Negative for polydipsia, polyphagia and polyuria.  Genitourinary: Negative for dysuria, frequency and hematuria.  Musculoskeletal: Negative for gait problem.  Skin: Negative for color change, pallor and rash.  Neurological: Negative for dizziness, syncope, light-headedness and headaches.  Hematological: Does not bruise/bleed easily.  Psychiatric/Behavioral: Negative for behavioral problems and confusion.      Allergies  Actos; Celebrex; Demerol; Morphine and related; Ciprofloxacin; Metformin; and Zocor  Home Medications   Prior to Admission medications   Medication Sig Start Date End Date Taking? Authorizing Provider  amoxicillin-clavulanate (AUGMENTIN) 875-125 MG per tablet Take 1 tablet by mouth every 12 (twelve) hours. 12/04/14  Yes Donne Hazel, MD  aspirin EC 81 MG tablet Take 1 tablet (81 mg total) by mouth daily. 01/23/14  Yes Rowe Clack, MD  chlorthalidone (HYGROTON) 25 MG tablet Take 25 mg by mouth daily.    Yes Historical Provider, MD  feeding supplement, RESOURCE BREEZE, (RESOURCE BREEZE) LIQD Take 1 Container by mouth 2 (two) times daily between meals. 12/04/14  Yes Donne Hazel, MD  fluticasone (FLONASE) 50 MCG/ACT nasal spray Place 2 sprays into both nostrils daily. Allergies 08/01/14  Yes Olga Millers, MD  HYDROcodone-acetaminophen (NORCO/VICODIN) 5-325 MG per tablet Take 1-2 tablets by mouth every 4 (four) hours as needed for moderate pain. 12/04/14  Yes Donne Hazel, MD  isosorbide mononitrate (IMDUR) 30 MG 24 hr tablet Take 1 tablet (30 mg total) by mouth every evening. Patient taking differently: Take 30 mg by mouth daily.  08/01/14  Yes Olga Millers, MD  oxycodone (OXY-IR) 5 MG capsule Take 5 mg by mouth every 6 (six) hours as needed for pain.   Yes Historical Provider, MD  Oxycodone HCl 10 MG TABS Take 10 mg by mouth every 6 (six) hours as needed (sever e pain).    Yes Historical Provider, MD  Polyethyl Glycol-Propyl Glycol (SYSTANE) 0.4-0.3 % SOLN Apply 2 drops to eye 4 (four) times daily.    Yes Historical Provider, MD  pravastatin (PRAVACHOL) 20 MG tablet Take 3 tablets (60 mg total) by mouth daily. 08/01/14  Yes Olga Millers, MD  valsartan (DIOVAN) 160 MG tablet Take 1 tablet (160 mg total) by mouth daily. 08/01/14  Yes Olga Millers, MD  albuterol (PROVENTIL HFA) 108 (90 BASE) MCG/ACT inhaler Inhale 2 puffs into the lungs every 4 (four) hours as needed for shortness of breath. Wheezing     Historical Provider, MD  albuterol (PROVENTIL) (2.5 MG/3ML) 0.083% nebulizer solution USE 1 VIAL WITH NEBULIZER EVERY 4 HOURS AS NEEDED FOR WHEEZING 04/13/14   Deneise Lever, MD  nitroGLYCERIN (NITROSTAT) 0.4 MG SL tablet Place 0.4 mg under the tongue every 5 (five) minutes as needed for chest pain. Chest pain     Historical Provider, MD   BP 124/52 mmHg  Pulse 84  Temp(Src) 97.9 F (36.6 C) (Oral)  Resp 16  SpO2 96% Physical Exam  Constitutional: He is oriented to person, place, and time. He appears well-developed and well-nourished. No distress.  HENT:  Head: Normocephalic.  Eyes: Conjunctivae are normal. Pupils are equal, round, and reactive to light. No scleral icterus.  Neck: Normal range of motion. Neck supple. No thyromegaly present.  Cardiovascular: Normal rate and regular rhythm.  Exam reveals no gallop and no friction rub.   No murmur heard. Pulmonary/Chest: Effort normal and breath sounds normal. No respiratory distress. He has no wheezes. He has no rales.  Abdominal: Soft. Bowel sounds are normal. He exhibits no distension. There is no tenderness. There is no rebound.    Musculoskeletal: Normal range of motion.  Neurological: He is alert and oriented to person, place, and time.  Skin: Skin is warm and dry. No rash noted.  Psychiatric: He has a normal mood and affect. His behavior is normal.    ED Course  Procedures (including  critical care time) Labs Review Labs Reviewed  CBC WITH DIFFERENTIAL/PLATELET - Abnormal; Notable for the following:    WBC 17.3 (*)    RBC 3.41 (*)    Hemoglobin 10.8 (*)    HCT 33.5 (*)    Neutrophils Relative % 84 (*)    Neutro Abs 14.5 (*)    Lymphocytes Relative 6 (*)    Monocytes Absolute 1.6 (*)    All other components within normal limits  BASIC METABOLIC PANEL - Abnormal; Notable for the following:    Chloride 99 (*)    Glucose, Bld 127 (*)    All other components within normal limits  URINE CULTURE  CULTURE, BLOOD (ROUTINE X 2)  CULTURE, BLOOD (ROUTINE X 2)  URINALYSIS, ROUTINE W REFLEX MICROSCOPIC (NOT AT [facility])  I-STAT CG4 LACTIC ACID, ED    Imaging Review Ct Abdomen Pelvis W Contrast  12/07/2014   CLINICAL DATA:  Large wound dehiscence at the mid abdomen, status post recent surgery. Worsening pain and swelling at the wound. Initial encounter.  EXAM: CT ABDOMEN AND PELVIS WITH CONTRAST  TECHNIQUE: Multidetector CT imaging of the abdomen and pelvis was performed using the standard protocol following bolus administration of intravenous contrast.  CONTRAST:  133mL OMNIPAQUE IOHEXOL 300 MG/ML  SOLN  COMPARISON:  CT of the abdomen and pelvis from 11/26/2014  FINDINGS: Mild right basilar opacity may reflect atelectasis or possibly mild pneumonia. Diffuse coronary artery calcifications are seen. Pacemaker leads are partially imaged. Postoperative change is seen anterior to the gastroesophageal junction.  Scattered hepatic cysts are again seen, measuring up to 4.4 cm in size. The spleen is unremarkable in appearance. The gallbladder is within normal limits. The pancreas and adrenal glands are unremarkable.  Scattered small bilateral renal cysts measure up to 2.7 cm in size. There is no evidence of hydronephrosis. No renal or ureteral stones are seen. No perinephric stranding is appreciated.  A large wound dehiscence is noted along the anterior midline abdominal wall, with mild associated  soft tissue inflammation and minimal adjacent soft tissue air. There is no evidence of abscess formation at this  time. Minimal adjacent intraperitoneal soft tissue stranding is likely postoperative in nature.  No free fluid is identified. The small bowel is unremarkable in appearance. The stomach is within normal limits. No acute vascular abnormalities are seen. Diffuse calcification is noted along the abdominal aorta and its branches, including at the proximal superior mesenteric artery and proximal renal arteries bilaterally.  The appendix is normal in caliber, without evidence of appendicitis. Residual contrast is seen within the colon, to the level of the patient's left lower quadrant colostomy. The colostomy is unremarkable in appearance.  The patient's Hartmann's pouch is unremarkable in appearance, with a small amount of residual contrast seen.  The bladder is mildly distended and grossly unremarkable. Scattered brachytherapy seeds are seen at the prostate bed. No inguinal lymphadenopathy is seen. A penile implant and reservoir are partially imaged.  No acute osseous abnormalities are identified. The patient is status post right hip arthroplasty. Multilevel vacuum phenomenon is noted along the lumbar spine.  IMPRESSION: 1. Large wound dehiscence along the anterior midline abdominal wall, with mild associated soft tissue inflammation and minimal adjacent soft tissue air. No evidence of abscess formation at this time. Minimal adjacent intraperitoneal soft tissue stranding is likely postoperative in nature. 2. Mild right basilar airspace opacity may reflect atelectasis or possibly mild pneumonia. 3. Diffuse coronary artery calcifications seen. 4. Scattered hepatic and bilateral renal cysts seen. 5. Diffuse calcification along the abdominal aorta and its branches, including at the proximal superior mesenteric artery and proximal renal arteries bilaterally. 6. Left lower quadrant colostomy is unremarkable in  appearance. 7. Minimal degenerative change along the lumbar spine.   Electronically Signed   By: Garald Balding M.D.   On: 12/07/2014 20:52   Dg Chest Port 1 View  12/07/2014   CLINICAL DATA:  81 year old male with recent surgery presenting with leukocytosis.  EXAM: PORTABLE CHEST - 1 VIEW  COMPARISON:  Chest x-ray dated 12/02/2014  FINDINGS: Single-view of the chest demonstrates mild emphysematous changes. Minimal bibasilar subsegmental atelectatic changes noted. No focal consolidation or pneumothorax. No pleural effusion. The cardiomediastinal silhouette is within normal limits. Median sternotomy wires, CABG clips, and the left pectoral pacemaker device noted. The osseous structures are grossly unremarkable.  IMPRESSION: No acute cardiopulmonary process.   Electronically Signed   By: Anner Crete M.D.   On: 12/07/2014 20:23     EKG Interpretation None      MDM   Final diagnoses:  AP (abdominal pain)  Leukocytosis    Recent seen and evaluated by Dr. Redmond Pulling of surgery. CT does not show obvious intra-abdominal complication. Possibility of atelectasis versus pneumonia on CT. Leukocytosis. Will be admitted to medicine. Surgical consult for consideration of wound VAC.    Tanna Furry, MD 12/07/14 [phone]

## 2014-12-07 NOTE — H&P (Signed)
PCP: [name], MD  Surgery [facility] Pulmonology [facility] Cardiology [place]  Referring provider Jeneen Rinks   Chief Complaint:  Abdominal Wound pain HPI: Eric [name] is a 81 y.o. male   has a past medical history of LACTOSE INTOLERANCE; OBESITY; CARDIOMYOPATHY, ISCHEMIC; AORTIC SCLEROSIS; SICK SINUS/ TACHY-BRADY SYNDROME (09/2007); PERIPHERAL VASCULAR DISEASE; CAROTID BRUIT, RIGHT (02/27/2008); IBS (irritable bowel syndrome); ALLERGIC RHINITIS; ANEMIA-NOS; OA (osteoarthritis); COPD; GERD; HYPERLIPIDEMIA; HIATAL HERNIA; Diverticulosis; Prostate cancer; DIABETES MELLITUS-TYPE II; CORONARY ARTERY DISEASE; HYPERTENSION; Asthma; Sleep apnea; Partial small bowel obstruction; Primary hyperparathyroidism; SMALL BOWEL OBSTRUCTION (04/18/2009); Cataract; Symptomatic diverticulosis (01/18/2009); echocardiogram; Diastolic dysfunction, Grade 1 (11/24/2014); Diverticulitis of colon with perforation (11/22/2014); and Hyponatremia (11/22/2014).   On 15th of June patient was admitted for diverticulitis with colon perforation with free air. Patient first refused the procedure but as his symptoms worsened on June 22 undergone exploratory laparoscopy with sigmoid colectomy and descending colostomy. The midline incision was left open to heal by secondary intention. Patient initially was treated with Zosyn but then was changed to [facility] and discharged on Augmentin to be finished on 6/29. Patient was discharged to skilled nursing facility [facility]. Today nursing home staff noted some increase in pain and drainage with some green discharge file order an some sloughing of the wound. Per recommendations of surgery patient was sent back to the ER. Of note patient was undergoing wet-to-dry dressings while at the nursing facility.  The patient ostomy has been functioning well he haven't had any nausea or vomiting. He have not had any fevers or chills. CT scan was performed in  emergency department showing  a large wound dehiscence along the anterior midline abdominal wall with some soft tissue inflammation. No evidence of abscess noted. Note there was an area of mild right nasal airspace which could've been atelectasis versus early pneumonia. There is evidence of significant atherosclerotic changes to abdominal aorta and its branches. Case has been discussed with Dr. Greer Pickerel (Surgery) this point they feel that there is no intra-abdominal infection or abscess recommend continue wet-to-dry dressing and possible negative pressure wound VAC.   Patient's  medical history involves diastolic heart failure that has been stable. Well-controlled type 2 diabetes with A1c 7 months ago with 6.9. History of coronary artery disease for which patient is on aspirin and statin therapy.   Hospitalist was called for admission for wound care leukocytosis and possible healthcare acquired pneumonia  Review of Systems:    Pertinent positives include: wound pain  Constitutional:  No weight loss, night sweats, Fevers, chills, fatigue, weight loss  HEENT:  No headaches, Difficulty swallowing,Tooth/dental problems,Sore throat,  No sneezing, itching, ear ache, nasal congestion, post nasal drip,  Cardio-vascular:  No chest pain, Orthopnea, PND, anasarca, dizziness, palpitations.no Bilateral lower extremity swelling  GI:  No heartburn, indigestion, abdominal pain, nausea, vomiting, diarrhea, change in bowel habits, loss of appetite, melena, blood in stool, hematemesis Resp:  no shortness of breath at rest. No dyspnea on exertion, No excess mucus, no productive cough, No non-productive cough, No coughing up of blood.No change in color of mucus.No wheezing. Skin:  no rash or lesions. No jaundice GU:  no dysuria, change in color of urine, no urgency or frequency. No straining to urinate.  No flank pain.  Musculoskeletal:  No joint pain or no joint swelling. No decreased range of motion. No back pain.  Psych:  No  change in mood or affect. No depression or anxiety. No memory loss.  Neuro: no localizing neurological complaints,  no tingling, no weakness, no double vision, no gait abnormality, no slurred speech, no confusion  Otherwise ROS are negative except for above, 10 systems were reviewed  Past Medical History: Past Medical History  Diagnosis Date  . LACTOSE INTOLERANCE   . OBESITY   . CARDIOMYOPATHY, ISCHEMIC   . AORTIC SCLEROSIS   . SICK SINUS/ TACHY-BRADY SYNDROME 09/2007    s/p PPM st judes  . PERIPHERAL VASCULAR DISEASE   . CAROTID BRUIT, RIGHT 02/27/2008  . IBS (irritable bowel syndrome)   . ALLERGIC RHINITIS   . ANEMIA-NOS   . OA (osteoarthritis)   . COPD   . GERD   . HYPERLIPIDEMIA   . HIATAL HERNIA   . Diverticulosis   . Prostate cancer     seed implants 2004  . DIABETES MELLITUS-TYPE II     diet controlled  . CORONARY ARTERY DISEASE     CABG 1995, PTCA/DES 2008, 2009 and 08/2010  . HYPERTENSION   . Asthma   . Sleep apnea   . Partial small bowel obstruction   . Primary hyperparathyroidism     Lab Results Component Value Date  PTH 150.7* 02/13/2013  CALCIUM 11.0* 02/13/2013  CAION 1.21 03/15/2008    . SMALL BOWEL OBSTRUCTION 04/18/2009    Qualifier: History of  By: Asa Lente MD, Jannifer Rodney Cataract     surgery  . Symptomatic diverticulosis 01/18/2009    Qualifier: Diagnosis of  By: Trellis Paganini PA-c, Amy S   . Hx of echocardiogram     Echo (9/15):  Mild LVH, EF 50-55%, no RWMA, Gr 1 DD, MAC, mild LAE.  . Diastolic dysfunction, Grade 1 11/24/2014  . Diverticulitis of colon with perforation 11/22/2014  . Hyponatremia 11/22/2014   Past Surgical History  Procedure Laterality Date  . Ptca  2008, 2009, 2012    with DES  . Partial small bowel obstruction  2009  . Bilateral cataracts    . Pacemaker insertion      DDD/[employer]         Last interrogation 2/13  on chart     Pacemaker guideline order Dr Tamala Julian on chart  . Inguinal hernia repair Bilateral   . Lumbar disc  surgery  12/2008  . Coronary artery bypass graft    . Total hip arthroplasty  08/21/2011    Procedure: TOTAL HIP ARTHROPLASTY;  Surgeon: Johnn Hai, MD;  Location: [facility] ORS;  Service: Orthopedics;  Laterality: Right;  . Colonoscopy    . Esophagogastroduodenoscopy  multiple  . Penile prosthesis placement    . Back surgery    . Colon surgery    . Flexible sigmoidoscopy N/A 09/22/2013    Procedure: FLEXIBLE SIGMOIDOSCOPY;  Surgeon: Gatha Mayer, MD;  Location: [facility] ENDOSCOPY;  Service: Endoscopy;  Laterality: N/A;  . Colon resection N/A 11/28/2014    Procedure: EXPLORATORY LAPAROTOMY, SIGMOID COLECTOMY WITH COLOSTOMY;  Surgeon: Jackolyn Confer, MD;  Location: [facility] ORS;  Service: General;  Laterality: N/A;     Medications: Prior to Admission medications   Medication Sig Start Date End Date Taking? Authorizing Provider  amoxicillin-clavulanate (AUGMENTIN) 875-125 MG per tablet Take 1 tablet by mouth every 12 (twelve) hours. 12/04/14  Yes Donne Hazel, MD  aspirin EC 81 MG tablet Take 1 tablet (81 mg total) by mouth daily. 01/23/14  Yes Rowe Clack, MD  chlorthalidone (HYGROTON) 25 MG tablet Take 25 mg by mouth daily.    Yes Historical Provider, MD  feeding supplement, RESOURCE BREEZE, (RESOURCE  BREEZE) LIQD Take 1 Container by mouth 2 (two) times daily between meals. 12/04/14  Yes Donne Hazel, MD  fluticasone (FLONASE) 50 MCG/ACT nasal spray Place 2 sprays into both nostrils daily. Allergies 08/01/14  Yes Olga Millers, MD  HYDROcodone-acetaminophen (NORCO/VICODIN) 5-325 MG per tablet Take 1-2 tablets by mouth every 4 (four) hours as needed for moderate pain. 12/04/14  Yes Donne Hazel, MD  isosorbide mononitrate (IMDUR) 30 MG 24 hr tablet Take 1 tablet (30 mg total) by mouth every evening. Patient taking differently: Take 30 mg by mouth daily.  08/01/14  Yes Olga Millers, MD  oxycodone (OXY-IR) 5 MG capsule Take 5 mg by mouth every 6 (six) hours as needed for pain.   Yes  Historical Provider, MD  Oxycodone HCl 10 MG TABS Take 10 mg by mouth every 6 (six) hours as needed (sever e pain).   Yes Historical Provider, MD  Polyethyl Glycol-Propyl Glycol (SYSTANE) 0.4-0.3 % SOLN Apply 2 drops to eye 4 (four) times daily.    Yes Historical Provider, MD  pravastatin (PRAVACHOL) 20 MG tablet Take 3 tablets (60 mg total) by mouth daily. 08/01/14  Yes Olga Millers, MD  valsartan (DIOVAN) 160 MG tablet Take 1 tablet (160 mg total) by mouth daily. 08/01/14  Yes Olga Millers, MD  albuterol (PROVENTIL HFA) 108 (90 BASE) MCG/ACT inhaler Inhale 2 puffs into the lungs every 4 (four) hours as needed for shortness of breath. Wheezing     Historical Provider, MD  albuterol (PROVENTIL) (2.5 MG/3ML) 0.083% nebulizer solution USE 1 VIAL WITH NEBULIZER EVERY 4 HOURS AS NEEDED FOR WHEEZING 04/13/14   Deneise Lever, MD  nitroGLYCERIN (NITROSTAT) 0.4 MG SL tablet Place 0.4 mg under the tongue every 5 (five) minutes as needed for chest pain. Chest pain     Historical Provider, MD    Allergies:   Allergies  Allergen Reactions  . Actos [Pioglitazone Hydrochloride] Other (See Comments)    "felt funny, drowsy, and weak":  . Celebrex [Celecoxib] Other (See Comments)    "felt funny"  . Demerol Palpitations and Other (See Comments)    Increased BP  . Morphine And Related Nausea And Vomiting  . Ciprofloxacin Other (See Comments)    arthralgia  . Metformin Nausea And Vomiting  . Zocor [Simvastatin] Other (See Comments)    Makes pt very drowsy    Social History:  Ambulatory since surgery needs assistance to get out of the bed   From facility [facility]   reports that he quit smoking about 19 years ago. He has never used smokeless tobacco. He reports that he does not drink alcohol or use illicit drugs.    Family History: family history includes Cancer in his mother; Heart attack in his brother and other family members; Hypertension in his mother. There is no history of Colon  cancer or Stroke.    Physical Exam: Patient Vitals for the past 24 hrs:  BP Temp Temp src Pulse Resp SpO2  12/07/14 2151 (!) 124/52 mmHg - - 84 16 96 %  12/07/14 1957 - 97.9 F (36.6 C) Oral - - -  12/07/14 1944 141/65 mmHg - - 88 20 97 %  12/07/14 1630 (!) 117/45 mmHg 98.2 F (36.8 C) Oral 86 16 (!) 88 %    1. General:  in No Acute distress 2. Psychological: Alert and   Oriented 3. Head/ENT:   Moist   Mucous Membranes  Head Non traumatic, neck supple                          Normal Dentition 4. SKIN: normal   Skin turgor,  Skin clean Dry large open wound over her abdomen noted 5. Heart: Regular rate and rhythm no Murmur, Rub or gallop 6. Lungs: no wheezes some crackles   7. Abdomen: Soft, non-tender, Non distended 8. Lower extremities: no clubbing, cyanosis, or edema 9. Neurologically Grossly intact, moving all 4 extremities equally 10. MSK: Normal range of motion  body mass index is unknown because there is no weight on file.   Labs on Admission:   Results for orders placed or performed during the hospital encounter of 12/07/14 (from the past 24 hour(s))  CBC with Differential/Platelet     Status: Abnormal   Collection Time: 12/07/14  5:32 PM  Result Value Ref Range   WBC 17.3 (H) 4.0 - 10.5 K/uL   RBC 3.41 (L) 4.22 - 5.81 MIL/uL   Hemoglobin 10.8 (L) 13.0 - 17.0 g/dL   HCT 33.5 (L) 39.0 - 52.0 %   MCV 98.2 78.0 - 100.0 fL   MCH 31.7 26.0 - 34.0 pg   MCHC 32.2 30.0 - 36.0 g/dL   RDW 12.4 11.5 - 15.5 %   Platelets 267 150 - 400 K/uL   Neutrophils Relative % 84 (H) 43 - 77 %   Neutro Abs 14.5 (H) 1.7 - 7.7 K/uL   Lymphocytes Relative 6 (L) 12 - 46 %   Lymphs Abs 1.1 0.7 - 4.0 K/uL   Monocytes Relative 9 3 - 12 %   Monocytes Absolute 1.6 (H) 0.1 - 1.0 K/uL   Eosinophils Relative 1 0 - 5 %   Eosinophils Absolute 0.2 0.0 - 0.7 K/uL   Basophils Relative 0 0 - 1 %   Basophils Absolute 0.1 0.0 - 0.1 K/uL  Basic metabolic panel     Status:  Abnormal   Collection Time: 12/07/14  5:32 PM  Result Value Ref Range   Sodium 135 135 - 145 mmol/L   Potassium 4.1 3.5 - 5.1 mmol/L   Chloride 99 (L) 101 - 111 mmol/L   CO2 28 22 - 32 mmol/L   Glucose, Bld 127 (H) 65 - 99 mg/dL   BUN 11 6 - 20 mg/dL   Creatinine, Ser 1.01 0.61 - 1.24 mg/dL   Calcium 10.3 8.9 - 10.3 mg/dL   GFR calc non Af Amer >60 >60 mL/min   GFR calc Af Amer >60 >60 mL/min   Anion gap 8 5 - 15  Urinalysis, Routine w reflex microscopic (not at [facility])     Status: None   Collection Time: 12/07/14  8:06 PM  Result Value Ref Range   Color, Urine YELLOW YELLOW   APPearance CLEAR CLEAR   Specific Gravity, Urine 1.009 1.005 - 1.030   pH 7.5 5.0 - 8.0   Glucose, UA NEGATIVE NEGATIVE mg/dL   Hgb urine dipstick NEGATIVE NEGATIVE   Bilirubin Urine NEGATIVE NEGATIVE   Ketones, ur NEGATIVE NEGATIVE mg/dL   Protein, ur NEGATIVE NEGATIVE mg/dL   Urobilinogen, UA 0.2 0.0 - 1.0 mg/dL   Nitrite NEGATIVE NEGATIVE   Leukocytes, UA NEGATIVE NEGATIVE  I-Stat CG4 Lactic Acid, ED     Status: None   Collection Time: 12/07/14  9:04 PM  Result Value Ref Range   Lactic Acid, Venous 1.41 0.5 - 2.0 mmol/L    UA no evidence of UTI  Lab Results  Component Value Date   HGBA1C 6.5* 11/22/2014    Estimated Creatinine Clearance: 58.8 mL/min (by C-G formula based on Cr of 1.01).  BNP (last 3 results)  Recent Labs  02/23/14 1232  PROBNP 35.0    Other results:  I have pearsonaly reviewed this: ECG REPORT not obtained There were no vitals filed for this visit.   Cultures:    Component Value Date/Time   SDES BLOOD RIGHT HAND 03/15/2008 1952   SPECREQUEST BOTTLES DRAWN AEROBIC AND ANAEROBIC 10CC 03/15/2008 1952   CULT NO GROWTH 5 DAYS 03/15/2008 1952   REPTSTATUS 03/22/2008 FINAL 03/15/2008 1952     Radiological Exams on Admission: Ct Abdomen Pelvis W Contrast  12/07/2014   CLINICAL DATA:  Large wound dehiscence at the mid abdomen, status post recent surgery. Worsening  pain and swelling at the wound. Initial encounter.  EXAM: CT ABDOMEN AND PELVIS WITH CONTRAST  TECHNIQUE: Multidetector CT imaging of the abdomen and pelvis was performed using the standard protocol following bolus administration of intravenous contrast.  CONTRAST:  174mL OMNIPAQUE IOHEXOL 300 MG/ML  SOLN  COMPARISON:  CT of the abdomen and pelvis from 11/26/2014  FINDINGS: Mild right basilar opacity may reflect atelectasis or possibly mild pneumonia. Diffuse coronary artery calcifications are seen. Pacemaker leads are partially imaged. Postoperative change is seen anterior to the gastroesophageal junction.  Scattered hepatic cysts are again seen, measuring up to 4.4 cm in size. The spleen is unremarkable in appearance. The gallbladder is within normal limits. The pancreas and adrenal glands are unremarkable.  Scattered small bilateral renal cysts measure up to 2.7 cm in size. There is no evidence of hydronephrosis. No renal or ureteral stones are seen. No perinephric stranding is appreciated.  A large wound dehiscence is noted along the anterior midline abdominal wall, with mild associated soft tissue inflammation and minimal adjacent soft tissue air. There is no evidence of abscess formation at this time. Minimal adjacent intraperitoneal soft tissue stranding is likely postoperative in nature.  No free fluid is identified. The small bowel is unremarkable in appearance. The stomach is within normal limits. No acute vascular abnormalities are seen. Diffuse calcification is noted along the abdominal aorta and its branches, including at the proximal superior mesenteric artery and proximal renal arteries bilaterally.  The appendix is normal in caliber, without evidence of appendicitis. Residual contrast is seen within the colon, to the level of the patient's left lower quadrant colostomy. The colostomy is unremarkable in appearance.  The patient's Hartmann's pouch is unremarkable in appearance, with a small amount of  residual contrast seen.  The bladder is mildly distended and grossly unremarkable. Scattered brachytherapy seeds are seen at the prostate bed. No inguinal lymphadenopathy is seen. A penile implant and reservoir are partially imaged.  No acute osseous abnormalities are identified. The patient is status post right hip arthroplasty. Multilevel vacuum phenomenon is noted along the lumbar spine.  IMPRESSION: 1. Large wound dehiscence along the anterior midline abdominal wall, with mild associated soft tissue inflammation and minimal adjacent soft tissue air. No evidence of abscess formation at this time. Minimal adjacent intraperitoneal soft tissue stranding is likely postoperative in nature. 2. Mild right basilar airspace opacity may reflect atelectasis or possibly mild pneumonia. 3. Diffuse coronary artery calcifications seen. 4. Scattered hepatic and bilateral renal cysts seen. 5. Diffuse calcification along the abdominal aorta and its branches, including at the proximal superior mesenteric artery and proximal renal arteries bilaterally. 6. Left lower quadrant colostomy is unremarkable in appearance. 7.  Minimal degenerative change along the lumbar spine.   Electronically Signed   By: Garald Balding M.D.   On: 12/07/2014 20:52   Dg Chest Port 1 View  12/07/2014   CLINICAL DATA:  81 year old male with recent surgery presenting with leukocytosis.  EXAM: PORTABLE CHEST - 1 VIEW  COMPARISON:  Chest x-ray dated 12/02/2014  FINDINGS: Single-view of the chest demonstrates mild emphysematous changes. Minimal bibasilar subsegmental atelectatic changes noted. No focal consolidation or pneumothorax. No pleural effusion. The cardiomediastinal silhouette is within normal limits. Median sternotomy wires, CABG clips, and the left pectoral pacemaker device noted. The osseous structures are grossly unremarkable.  IMPRESSION: No acute cardiopulmonary process.   Electronically Signed   By: Anner Crete M.D.   On: 12/07/2014 20:23      Chart has been reviewed  Family not at  Bedside   Assessment/Plan  81 yo M with hx of CAD, COPD, DM2, diastolic CHF here after Exlap for diverticulitis with perforation presents with worsening abdominal wound pain rising WBC and Possible early HCAP on CT  Present on Admission:  Open abdominal wound - patient is wound was left open for secondary healing, he has been seen by surgery who at this point feels that there is no evidence of infection CT scan did not show any evidence of intra-abdominal infection or abscess. Patient will need good wound care and possibly wound VAC placement. Given rising white blood cell count and some degree of foul smell associated with this wound would be prudent to cover with IV antibiotics and closely monitor.  [facility] surgery chemical DVT prophylaxis is okay. Discussion with nursing staff patient is better suited for stepdown bed for wound care.  [name] HCAP (healthcare-associated pneumonia) - admit for broad-spectrum antibodies which should also cover the wound case of brewing infection  . Essential hypertension -stable continue home medications  . Coronary atherosclerosis - possibly stable no history of chest pain continue statin and aspirin  . Leukocytosis- possibly stress related to the setting of recent intra-abdominal surgery, versus developing HCAP . Diastolic dysfunction, Grade 1- stable continue home regimen DM 2 - SSI diabetic diet    Prophylaxis:  Lovenox   CODE STATUS:  FULL CODE as per patient    Disposition:                            Back to current facility when stable                             Other plan as per orders.  I have spent a total of 55 min on this admission  Rayel Santizo 12/07/2014, 10:14 PM  [facility]  Pager [phone]   after 2 AM please page floor coverage PA If 7AM-7PM, please contact the day team taking care of the patient  [email or web address]  Password [email or web address]

## 2014-12-07 NOTE — Assessment & Plan Note (Signed)
With colostomy and midline open wound from exploratory lap 6/22 for perforated diverticulum; was on zosyn, then invanz then d/c SNF with augmentin through 6/29; pt with increased pain, increased drainage with green color, new odor, slough on a wound that was beefy red yesterday, all in Friday aft before a long weekend; spoke with Surgery office, too late in day to get pt into office; will send to ED [place] [facility]

## 2014-12-07 NOTE — ED Notes (Signed)
pts daughter called, wanted to know if pt would be admitted, she will not be coming to see him but will call back about admission.

## 2014-12-07 NOTE — Progress Notes (Signed)
MRN: [id number] Name: Eric [name]  Sex: male Age: 81 y.o. DOB: 1926/12/21  [facility] #: [facility] Facility/Room:112 Level Of Care: SNF Provider: Inocencio Homes D Emergency Contacts: Extended Emergency Contact Information Primary Emergency Contact: Madden,Toni Address: [address]          [place], [place] [address] Johnnette Litter of [place] Phone: [phone] Work Phone: [phone] Mobile Phone: [phone] Relation: Daughter Secondary Emergency Contact: Palma Holter [place] of [place] Phone: [phone] Mobile Phone: [phone] Relation: Daughter  Code Status:   Allergies: Actos; Celebrex; Demerol; Morphine and related; Ciprofloxacin; Metformin; and Zocor  Chief Complaint  Patient presents with  . Acute Visit    HPI: Patient is 81 y.o. male who is s/p exp lap 2/2 perf diverticulum who wound nurse asked me to see for change in wound since yesterday.  Past Medical History  Diagnosis Date  . LACTOSE INTOLERANCE   . OBESITY   . CARDIOMYOPATHY, ISCHEMIC   . AORTIC SCLEROSIS   . SICK SINUS/ TACHY-BRADY SYNDROME 09/2007    s/p PPM st judes  . PERIPHERAL VASCULAR DISEASE   . CAROTID BRUIT, RIGHT 02/27/2008  . IBS (irritable bowel syndrome)   . ALLERGIC RHINITIS   . ANEMIA-NOS   . OA (osteoarthritis)   . COPD   . GERD   . HYPERLIPIDEMIA   . HIATAL HERNIA   . Diverticulosis   . Prostate cancer     seed implants 2004  . DIABETES MELLITUS-TYPE II     diet controlled  . CORONARY ARTERY DISEASE     CABG 1995, PTCA/DES 2008, 2009 and 08/2010  . HYPERTENSION   . Asthma   . Sleep apnea   . Partial small bowel obstruction   . Primary hyperparathyroidism     Lab Results Component Value Date  PTH 150.7* 02/13/2013  CALCIUM 11.0* 02/13/2013  CAION 1.21 03/15/2008    . SMALL BOWEL OBSTRUCTION 04/18/2009    Qualifier: History of  By: Asa Lente MD, Jannifer Rodney Cataract     surgery  . Symptomatic diverticulosis 01/18/2009    Qualifier: Diagnosis of  By:  Trellis Paganini PA-c, Amy S   . Hx of echocardiogram     Echo (9/15):  Mild LVH, EF 50-55%, no RWMA, Gr 1 DD, MAC, mild LAE.  . Diastolic dysfunction, Grade 1 11/24/2014  . Diverticulitis of colon with perforation 11/22/2014  . Hyponatremia 11/22/2014    Past Surgical History  Procedure Laterality Date  . Ptca  2008, 2009, 2012    with DES  . Partial small bowel obstruction  2009  . Bilateral cataracts    . Pacemaker insertion      DDD/[employer]         Last interrogation 2/13  on chart     Pacemaker guideline order Dr Tamala Julian on chart  . Inguinal hernia repair Bilateral   . Lumbar disc surgery  12/2008  . Coronary artery bypass graft    . Total hip arthroplasty  08/21/2011    Procedure: TOTAL HIP ARTHROPLASTY;  Surgeon: Johnn Hai, MD;  Location: [facility];  Service: Orthopedics;  Laterality: Right;  . Colonoscopy    . Esophagogastroduodenoscopy  multiple  . Penile prosthesis placement    . Back surgery    . Colon surgery    . Flexible sigmoidoscopy N/A 09/22/2013    Procedure: FLEXIBLE SIGMOIDOSCOPY;  Surgeon: Gatha Mayer, MD;  Location: [facility];  Service: Endoscopy;  Laterality: N/A;  . Colon resection N/A 11/28/2014  Procedure: EXPLORATORY LAPAROTOMY, SIGMOID COLECTOMY WITH COLOSTOMY;  Surgeon: Jackolyn Confer, MD;  Location: [facility];  Service: General;  Laterality: N/A;      Medication List       This list is accurate as of: 12/07/14  3:12 PM.  Always use your most recent med list.               amoxicillin-clavulanate 875-125 MG per tablet  Commonly known as:  AUGMENTIN  Take 1 tablet by mouth every 12 (twelve) hours.     aspirin EC 81 MG tablet  Take 1 tablet (81 mg total) by mouth daily.     chlorthalidone 25 MG tablet  Commonly known as:  HYGROTON  Take 25 mg by mouth daily.     feeding supplement (RESOURCE BREEZE) Liqd  Take 1 Container by mouth 2 (two) times daily between meals.     fluticasone 50 MCG/ACT nasal spray  Commonly known as:  FLONASE   Place 2 sprays into both nostrils daily. Allergies     HYDROcodone-acetaminophen 5-325 MG per tablet  Commonly known as:  NORCO/VICODIN  Take 1-2 tablets by mouth every 4 (four) hours as needed for moderate pain.     isosorbide mononitrate 30 MG 24 hr tablet  Commonly known as:  IMDUR  Take 1 tablet (30 mg total) by mouth every evening.     nitroGLYCERIN 0.4 MG SL tablet  Commonly known as:  NITROSTAT  Place 0.4 mg under the tongue every 5 (five) minutes as needed. Chest pain     pravastatin 20 MG tablet  Commonly known as:  PRAVACHOL  Take 3 tablets (60 mg total) by mouth daily.     PROVENTIL HFA 108 (90 BASE) MCG/ACT inhaler  Generic drug:  albuterol  Inhale 2 puffs into the lungs every 4 (four) hours as needed for shortness of breath. Wheezing     albuterol (2.5 MG/3ML) 0.083% nebulizer solution  Commonly known as:  PROVENTIL  USE 1 VIAL WITH NEBULIZER EVERY 4 HOURS AS NEEDED FOR WHEEZING     SYSTANE 0.4-0.3 % Soln  Generic drug:  Polyethyl Glycol-Propyl Glycol  Apply 2 drops to eye 4 (four) times daily.     valsartan 160 MG tablet  Commonly known as:  DIOVAN  Take 1 tablet (160 mg total) by mouth daily.        No orders of the defined types were placed in this encounter.    Immunization History  Administered Date(s) Administered  . Influenza Split 06/09/2011, 03/01/2012, 04/08/2014  . Influenza,inj,Quad PF,36+ Mos 02/02/2013, 01/23/2014  . Pneumococcal Conjugate-13 05/23/2014  . Pneumococcal Polysaccharide-23 02/27/2008  . Td 08/07/2008    History  Substance Use Topics  . Smoking status: Former Smoker    Quit date: 06/09/1995  . Smokeless tobacco: Never Used  . Alcohol Use: No    Review of Systems  DATA OBTAINED: from patient, nurse GENERAL:  no fevers, fatigue, appetite changes SKIN: midline abd wound with new slough, mild odor , increased drainage and increased pain HEENT: No complaint RESPIRATORY: No cough, wheezing, SOB CARDIAC: No chest pain,  palpitations, lower extremity edema  GI: No abdominal pain, No N/V/D or constipation, No heartburn or reflux  GU: No dysuria, frequency or urgency, or incontinence  MUSCULOSKELETAL: No unrelieved bone/joint pain NEUROLOGIC: No headache, dizziness  PSYCHIATRIC: No overt anxiety or sadness  Filed Vitals:   12/07/14 1502  BP: 130/76  Pulse: 60  Temp: 98 F (36.7 C)  Resp: 20    Physical  Exam  GENERAL APPEARANCE: Alert, conversant  SKIN: midline abd wound - reported beefy red yesterday, today with new slough, green drainage, mild odor HEENT: Unremarkable RESPIRATORY: Breathing is even, unlabored. Lung sounds are clear   CARDIOVASCULAR: Heart RRR no murmurs, rubs or gallops. No peripheral edema  GASTROINTESTINAL: Abdomen is soft, more tender per pt at base of wound , not distended w/ normal bowel sounds;colostomy on L.  GENITOURINARY: Bladder non tender, not distended  MUSCULOSKELETAL: No abnormal joints or musculature NEUROLOGIC: Cranial nerves 2-12 grossly intact. Moves all extremities PSYCHIATRIC: Mood and affect appropriate to situation, no behavioral issues  Patient Active Problem List   Diagnosis Date Noted  . CAD (coronary artery disease) of artery bypass graft 11/28/2014  . Diastolic dysfunction, Grade 1 11/24/2014  . Leukocytosis 11/22/2014  . Hyponatremia 11/22/2014  . Diverticulitis of colon with perforation 11/22/2014  . GERD (gastroesophageal reflux disease) 03/27/2014  . Pacemaker 04/10/2013  . Type 2 diabetes mellitus, controlled 01/18/2009  . Dyslipidemia 01/18/2009  . Essential hypertension 01/18/2009  . Coronary atherosclerosis 01/18/2009  . GERD 01/18/2009    CBC    Component Value Date/Time   WBC 11.5* 12/01/2014 0530   WBC 7.7 04/11/2014   RBC 3.36* 12/01/2014 0530   HGB 10.9* 12/01/2014 0530   HCT 33.1* 12/01/2014 0530   PLT 205 12/01/2014 0530   MCV 98.5 12/01/2014 0530   LYMPHSABS 0.9 11/21/2014 1700   MONOABS 1.0 11/21/2014 1700   EOSABS  0.0 11/21/2014 1700   BASOSABS 0.0 11/21/2014 1700    CMP     Component Value Date/Time   NA 136 12/01/2014 0530   NA 135* 04/11/2014   K 3.7 12/01/2014 0530   CL 103 12/01/2014 0530   CO2 25 12/01/2014 0530   GLUCOSE 131* 12/01/2014 0530   BUN 13 12/01/2014 0530   BUN 11 04/11/2014   CREATININE 0.74 12/01/2014 0530   CREATININE 1.0 04/11/2014   CALCIUM 9.5 12/01/2014 0530   PROT 7.3 11/22/2014 0450   ALBUMIN 4.0 11/22/2014 0450   AST 28 11/22/2014 0450   ALT 20 11/22/2014 0450   ALKPHOS 66 11/22/2014 0450   BILITOT 1.2 11/22/2014 0450   GFRNONAA >60 12/01/2014 0530   GFRAA >60 12/01/2014 0530    Assessment and Plan  Diverticulitis of colon with perforation With colostomy and midline open wound from exploratory lap 6/22 for perforated diverticulum; was on zosyn, then invanz then d/c SNF with augmentin through 6/29; pt with increased pain, increased drainage with green color, new odor, slough on a wound that was beefy red yesterday, all in Friday aft before a long weekend; spoke with Surgery office, too late in day to get pt into office; will send to ED [place] [facility]    Hennie Duos, MD

## 2014-12-08 ENCOUNTER — Encounter (HOSPITAL_COMMUNITY): Payer: Self-pay | Admitting: *Deleted

## 2014-12-08 DIAGNOSIS — I519 Heart disease, unspecified: Secondary | ICD-10-CM

## 2014-12-08 DIAGNOSIS — Z933 Colostomy status: Secondary | ICD-10-CM

## 2014-12-08 DIAGNOSIS — E669 Obesity, unspecified: Secondary | ICD-10-CM | POA: Diagnosis present

## 2014-12-08 LAB — COMPREHENSIVE METABOLIC PANEL
ALBUMIN: 3.1 g/dL — AB (ref 3.5–5.0)
ALT: 18 U/L (ref 17–63)
AST: 18 U/L (ref 15–41)
Alkaline Phosphatase: 62 U/L (ref 38–126)
Anion gap: 8 (ref 5–15)
BILIRUBIN TOTAL: 0.7 mg/dL (ref 0.3–1.2)
BUN: 10 mg/dL (ref 6–20)
CO2: 29 mmol/L (ref 22–32)
Calcium: 10.3 mg/dL (ref 8.9–10.3)
Chloride: 99 mmol/L — ABNORMAL LOW (ref 101–111)
Creatinine, Ser: 0.88 mg/dL (ref 0.61–1.24)
Glucose, Bld: 117 mg/dL — ABNORMAL HIGH (ref 65–99)
Potassium: 4 mmol/L (ref 3.5–5.1)
SODIUM: 136 mmol/L (ref 135–145)
TOTAL PROTEIN: 6.3 g/dL — AB (ref 6.5–8.1)

## 2014-12-08 LAB — GLUCOSE, CAPILLARY
GLUCOSE-CAPILLARY: 119 mg/dL — AB (ref 65–99)
GLUCOSE-CAPILLARY: 102 mg/dL — AB (ref 65–99)
GLUCOSE-CAPILLARY: 126 mg/dL — AB (ref 65–99)
Glucose-Capillary: 104 mg/dL — ABNORMAL HIGH (ref 65–99)
Glucose-Capillary: 115 mg/dL — ABNORMAL HIGH (ref 65–99)

## 2014-12-08 LAB — CBC WITH DIFFERENTIAL/PLATELET
Basophils Absolute: 0 K/uL (ref 0.0–0.1)
Basophils Relative: 0 % (ref 0–1)
Eosinophils Absolute: 0.2 K/uL (ref 0.0–0.7)
Eosinophils Relative: 2 % (ref 0–5)
HCT: 33.4 % — ABNORMAL LOW (ref 39.0–52.0)
Hemoglobin: 10.7 g/dL — ABNORMAL LOW (ref 13.0–17.0)
Lymphocytes Relative: 9 % — ABNORMAL LOW (ref 12–46)
Lymphs Abs: 1.1 K/uL (ref 0.7–4.0)
MCH: 31.7 pg (ref 26.0–34.0)
MCHC: 32 g/dL (ref 30.0–36.0)
MCV: 98.8 fL (ref 78.0–100.0)
Monocytes Absolute: 1.2 K/uL — ABNORMAL HIGH (ref 0.1–1.0)
Monocytes Relative: 10 % (ref 3–12)
Neutro Abs: 9.5 K/uL — ABNORMAL HIGH (ref 1.7–7.7)
Neutrophils Relative %: 79 % — ABNORMAL HIGH (ref 43–77)
Platelets: 276 K/uL (ref 150–400)
RBC: 3.38 MIL/uL — ABNORMAL LOW (ref 4.22–5.81)
RDW: 12.3 % (ref 11.5–15.5)
WBC: 12 K/uL — ABNORMAL HIGH (ref 4.0–10.5)

## 2014-12-08 LAB — EXPECTORATED SPUTUM ASSESSMENT W GRAM STAIN, RFLX TO RESP C

## 2014-12-08 LAB — EXPECTORATED SPUTUM ASSESSMENT W REFEX TO RESP CULTURE

## 2014-12-08 LAB — MRSA PCR SCREENING: MRSA by PCR: NEGATIVE

## 2014-12-08 LAB — STREP PNEUMONIAE URINARY ANTIGEN: Strep Pneumo Urinary Antigen: NEGATIVE

## 2014-12-08 MED ORDER — IRBESARTAN 150 MG PO TABS
150.0000 mg | ORAL_TABLET | Freq: Every day | ORAL | Status: DC
  Administered 2014-12-08 – 2014-12-12 (×5): 150 mg via ORAL
  Filled 2014-12-08 (×5): qty 1

## 2014-12-08 MED ORDER — ENOXAPARIN SODIUM 40 MG/0.4ML ~~LOC~~ SOLN
40.0000 mg | SUBCUTANEOUS | Status: DC
  Administered 2014-12-08 – 2014-12-12 (×5): 40 mg via SUBCUTANEOUS
  Filled 2014-12-08 (×5): qty 0.4

## 2014-12-08 MED ORDER — CETYLPYRIDINIUM CHLORIDE 0.05 % MT LIQD
7.0000 mL | Freq: Two times a day (BID) | OROMUCOSAL | Status: DC
  Administered 2014-12-08 – 2014-12-12 (×8): 7 mL via OROMUCOSAL

## 2014-12-08 MED ORDER — CHLORTHALIDONE 25 MG PO TABS
25.0000 mg | ORAL_TABLET | Freq: Every day | ORAL | Status: DC
  Administered 2014-12-08 – 2014-12-12 (×5): 25 mg via ORAL
  Filled 2014-12-08 (×5): qty 1

## 2014-12-08 MED ORDER — ISOSORBIDE MONONITRATE ER 30 MG PO TB24
30.0000 mg | ORAL_TABLET | Freq: Every day | ORAL | Status: DC
  Administered 2014-12-08 – 2014-12-12 (×5): 30 mg via ORAL
  Filled 2014-12-08 (×5): qty 1

## 2014-12-08 MED ORDER — HYDROCODONE-ACETAMINOPHEN 5-325 MG PO TABS
1.0000 | ORAL_TABLET | ORAL | Status: DC | PRN
  Administered 2014-12-08: 2 via ORAL
  Administered 2014-12-08 – 2014-12-10 (×4): 1 via ORAL
  Filled 2014-12-08: qty 1
  Filled 2014-12-08: qty 2
  Filled 2014-12-08 (×3): qty 1

## 2014-12-08 MED ORDER — VANCOMYCIN HCL IN DEXTROSE 750-5 MG/150ML-% IV SOLN
750.0000 mg | Freq: Two times a day (BID) | INTRAVENOUS | Status: DC
  Administered 2014-12-08 – 2014-12-09 (×4): 750 mg via INTRAVENOUS
  Filled 2014-12-08 (×5): qty 150

## 2014-12-08 MED ORDER — INSULIN ASPART 100 UNIT/ML ~~LOC~~ SOLN: 0.0000 [IU] | Freq: Every day | SUBCUTANEOUS | Status: DC

## 2014-12-08 MED ORDER — ALBUTEROL SULFATE (2.5 MG/3ML) 0.083% IN NEBU: 2.5000 mg | INHALATION_SOLUTION | RESPIRATORY_TRACT | Status: DC | PRN

## 2014-12-08 MED ORDER — PIPERACILLIN-TAZOBACTAM 3.375 G IVPB
3.3750 g | Freq: Three times a day (TID) | INTRAVENOUS | Status: AC
  Administered 2014-12-08 – 2014-12-11 (×12): 3.375 g via INTRAVENOUS
  Filled 2014-12-08 (×12): qty 50

## 2014-12-08 MED ORDER — INSULIN ASPART 100 UNIT/ML ~~LOC~~ SOLN
0.0000 [IU] | Freq: Three times a day (TID) | SUBCUTANEOUS | Status: DC
Start: 2014-12-08 — End: 2014-12-12
  Administered 2014-12-09 – 2014-12-10 (×3): 2 [IU] via SUBCUTANEOUS
  Administered 2014-12-11: 3 [IU] via SUBCUTANEOUS
  Administered 2014-12-11 – 2014-12-12 (×2): 2 [IU] via SUBCUTANEOUS

## 2014-12-08 MED ORDER — FLUTICASONE PROPIONATE 50 MCG/ACT NA SUSP
2.0000 | Freq: Every day | NASAL | Status: DC
  Administered 2014-12-08 – 2014-12-12 (×5): 2 via NASAL
  Filled 2014-12-08: qty 16

## 2014-12-08 MED ORDER — OXYCODONE HCL 5 MG PO TABS
10.0000 mg | ORAL_TABLET | Freq: Four times a day (QID) | ORAL | Status: DC | PRN
Start: 2014-12-08 — End: 2014-12-12
  Administered 2014-12-09 – 2014-12-11 (×4): 10 mg via ORAL
  Filled 2014-12-08 (×4): qty 2

## 2014-12-08 MED ORDER — PRAVASTATIN SODIUM 40 MG PO TABS
60.0000 mg | ORAL_TABLET | Freq: Every day | ORAL | Status: DC
  Administered 2014-12-08 – 2014-12-12 (×5): 60 mg via ORAL
  Filled 2014-12-08 (×4): qty 1
  Filled 2014-12-08: qty 3

## 2014-12-08 MED ORDER — ASPIRIN EC 81 MG PO TBEC
81.0000 mg | DELAYED_RELEASE_TABLET | Freq: Every day | ORAL | Status: DC
  Administered 2014-12-08 – 2014-12-12 (×5): 81 mg via ORAL
  Filled 2014-12-08 (×5): qty 1

## 2014-12-08 MED ORDER — CHLORHEXIDINE GLUCONATE 0.12 % MT SOLN
15.0000 mL | Freq: Two times a day (BID) | OROMUCOSAL | Status: DC
Start: 2014-12-08 — End: 2014-12-12
  Administered 2014-12-08 – 2014-12-12 (×7): 15 mL via OROMUCOSAL
  Filled 2014-12-08 (×10): qty 15

## 2014-12-08 MED ORDER — BOOST / RESOURCE BREEZE PO LIQD
1.0000 | Freq: Two times a day (BID) | ORAL | Status: DC
  Administered 2014-12-08 – 2014-12-12 (×8): 1 via ORAL

## 2014-12-08 NOTE — Evaluation (Addendum)
Physical Therapy Evaluation Patient Details Name: Eric [name] MRN: [id number] DOB: July 03, 1926 Today's Date: 12/08/2014   History of Present Illness  s/p Exploratory laparotomy, sigmoid colectomy, descending colostomy on 11/28/14, DC to SNF 6/28, returned 7/1 with dehiscence of wound.  Clinical Impression  Patient is mobilizing well. Patient expresses desire to learn Ostomy care, RN aware, so that he can return home. He expresses desire to not go to rehab. Patient will benefit from PT to address problems listed in note below. Ambulated on RA, sats 94%    Follow Up Recommendations Home health PT;SNF if he consents and changes his mind. ;Supervision/Assistance - 24 hour (patient does not want to go snf, )    Equipment Recommendations       Recommendations for Other Services       Precautions / Restrictions Precautions Precaution Comments: recent ABD surgery and L colostomy      Mobility  Bed Mobility   Bed Mobility: Rolling Rolling: Supervision Sidelying to sit: Supervision       General bed mobility comments: partial "log roll" to transition to EOB, cue to protect abdomen  Transfers Overall transfer level: Needs assistance Equipment used: Rolling walker (2 wheeled) Transfers: Sit to/from Stand Sit to Stand: Supervision            Ambulation/Gait Ambulation/Gait assistance: Supervision Ambulation Distance (Feet): 220 Feet Assistive device: Rolling walker (2 wheeled) Gait Pattern/deviations: WFL(Within Functional Limits)     General Gait Details: using walker safely,  Stairs            Wheelchair Mobility    Modified Rankin (Stroke Patients Only)       Balance           Standing balance support: During functional activity;Bilateral upper extremity supported Standing balance-Leahy Scale: Good                               Pertinent Vitals/Pain Pain Score: 3  Pain Location: abd. Pain Descriptors / Indicators:  Discomfort Pain Intervention(s): Limited activity within patient's tolerance;Premedicated before session    Home Living Family/patient expects to be discharged to:: Other (Comment) (apartment  at senior living) Living Arrangements: Alone   Type of Home: [place]: [name] - 2 wheels Additional Comments: [name]    Prior Function Level of Independence: Needs assistance   Gait / Transfers Assistance Needed: was in snf, reorts he ambulated with RW independently           Hand Dominance        Extremity/Trunk Assessment   Upper Extremity Assessment: Overall WFL for tasks assessed           Lower Extremity Assessment: Overall WFL for tasks assessed         Communication      Cognition Arousal/Alertness: Awake/alert Behavior During Therapy: [facility] for tasks assessed/performed;Anxious Overall Cognitive Status: Within Functional Limits for tasks assessed                      General Comments      Exercises        Assessment/Plan    PT Assessment Patient needs continued PT services  PT Diagnosis Difficulty walking   PT Problem List Decreased strength;Decreased activity tolerance;Decreased mobility;Decreased knowledge of precautions;Decreased safety awareness;Decreased knowledge of use of DME  PT Treatment Interventions DME instruction;Gait training;Functional mobility training;Therapeutic activities;Therapeutic exercise;Patient/family education  PT Goals (Current goals can be found in the Care Plan section) Acute Rehab PT Goals Patient Stated Goal: to go home PT Goal Formulation: With patient Time For Goal Achievement: 12/22/14 Potential to Achieve Goals: Good    Frequency Min 3X/week   Barriers to discharge        Co-evaluation               End of Session   Activity Tolerance: Patient tolerated treatment well Patient left: in chair;with call bell/phone within reach Nurse Communication:  Mobility status         Time: 1250-1313 PT Time Calculation (min) (ACUTE ONLY): 23 min   Charges:     PT Treatments $Gait Training: 8-22 mins   PT G Codes:        Eric [name] 12/08/2014, 1:55 PM Eric [name] PT [phone]

## 2014-12-08 NOTE — Progress Notes (Signed)
ANTIBIOTIC CONSULT NOTE - INITIAL  Pharmacy Consult for Vancomycin  Indication: pneumonia  Allergies  Allergen Reactions  . Actos [Pioglitazone Hydrochloride] Other (See Comments)    "felt funny, drowsy, and weak":  . Celebrex [Celecoxib] Other (See Comments)    "felt funny"  . Demerol Palpitations and Other (See Comments)    Increased BP  . Morphine And Related Nausea And Vomiting  . Ciprofloxacin Other (See Comments)    arthralgia  . Metformin Nausea And Vomiting  . Zocor [Simvastatin] Other (See Comments)    Makes pt very drowsy    Patient Measurements: Height: 5\' 6"  (167.6 cm) Weight: 205 lb 14.6 oz (93.4 kg) IBW/kg (Calculated) : 63.8  Vital Signs: Temp: 98.4 F (36.9 C) (07/02 0013) Temp Source: Oral (07/02 0013) BP: 120/39 mmHg (07/02 0013) Pulse Rate: 86 (07/02 0013) Intake/Output from previous day:   Intake/Output from this shift:    Labs:  Recent Labs  12/07/14 1732  WBC 17.3*  HGB 10.8*  PLT 267  CREATININE 1.01   Estimated Creatinine Clearance: 57.2 mL/min (by C-G formula based on Cr of 1.01). No results for input(s): VANCOTROUGH, VANCOPEAK, VANCORANDOM, GENTTROUGH, GENTPEAK, GENTRANDOM, TOBRATROUGH, TOBRAPEAK, TOBRARND, AMIKACINPEAK, AMIKACINTROU, AMIKACIN in the last 72 hours.   Microbiology: Recent Results (from the past 720 hour(s))  Surgical pcr screen     Status: None   Collection Time: 11/28/14 10:00 AM  Result Value Ref Range Status   MRSA, PCR NEGATIVE NEGATIVE Final   Staphylococcus aureus NEGATIVE NEGATIVE Final    Comment:        The Xpert SA Assay (FDA approved for NASAL specimens in patients over 68 years of age), is one component of a comprehensive surveillance program.  Test performance has been validated by [facility] for patients greater than or equal to 38 year old. It is not intended to diagnose infection nor to guide or monitor treatment.     Medical History: Past Medical History  Diagnosis Date  . LACTOSE  INTOLERANCE   . OBESITY   . CARDIOMYOPATHY, ISCHEMIC   . AORTIC SCLEROSIS   . SICK SINUS/ TACHY-BRADY SYNDROME 09/2007    s/p PPM st judes  . PERIPHERAL VASCULAR DISEASE   . CAROTID BRUIT, RIGHT 02/27/2008  . IBS (irritable bowel syndrome)   . ALLERGIC RHINITIS   . ANEMIA-NOS   . OA (osteoarthritis)   . COPD   . GERD   . HYPERLIPIDEMIA   . HIATAL HERNIA   . Diverticulosis   . Prostate cancer     seed implants 2004  . DIABETES MELLITUS-TYPE II     diet controlled  . CORONARY ARTERY DISEASE     CABG 1995, PTCA/DES 2008, 2009 and 08/2010  . HYPERTENSION   . Asthma   . Sleep apnea   . Partial small bowel obstruction   . Primary hyperparathyroidism     Lab Results Component Value Date  PTH 150.7* 02/13/2013  CALCIUM 11.0* 02/13/2013  CAION 1.21 03/15/2008    . SMALL BOWEL OBSTRUCTION 04/18/2009    Qualifier: History of  By: Asa Lente MD, Jannifer Rodney Cataract     surgery  . Symptomatic diverticulosis 01/18/2009    Qualifier: Diagnosis of  By: Trellis Paganini PA-c, Amy S   . Hx of echocardiogram     Echo (9/15):  Mild LVH, EF 50-55%, no RWMA, Gr 1 DD, MAC, mild LAE.  . Diastolic dysfunction, Grade 1 11/24/2014  . Diverticulitis of colon with perforation 11/22/2014  . Hyponatremia 11/22/2014  Medications:  Scheduled:  . aspirin EC  81 mg Oral Daily  . chlorthalidone  25 mg Oral Daily  . enoxaparin (LOVENOX) injection  40 mg Subcutaneous Q24H  . feeding supplement (RESOURCE BREEZE)  1 Container Oral BID BM  . fluticasone  2 spray Each Nare Daily  . insulin aspart  0-15 Units Subcutaneous TID WC  . insulin aspart  0-5 Units Subcutaneous QHS  . irbesartan  150 mg Oral Daily  . isosorbide mononitrate  30 mg Oral Daily  . piperacillin-tazobactam (ZOSYN)  IV  3.375 g Intravenous Q8H  . pravastatin  60 mg Oral Daily   Infusions:   Assessment:  81 yr male with significant PMH, including recent hospitalization for diverticulitis with colon perforation undergoing sigmoid colectomy &  descending colostomy. Pt has an open abdominal wound.  Presents to ED with abdominal wound pain.  CT shows possible early pneumonia  Pharmacy consulted to dose Vancomycin for treatment of suspected pneumonia  MD has ordered Zosyn, with request for pharmacy to adjust dosage of antibiotics as needed based on renal function  Blood and urine cultures ordered  CrCl (n) = 54 ml/min  Goal of Therapy:  Vancomycin trough level 15-20 mcg/ml  Plan:  Measure antibiotic drug levels at steady state Follow up culture results  Continue Zosyn as ordered by MD Vancomycin 750mg  IV q12h  Everette Rank, PharmD 12/08/2014,12:25 AM

## 2014-12-08 NOTE — Progress Notes (Signed)
Rt gave pt flutter valve. Pt knows and understands how to use. 

## 2014-12-08 NOTE — Progress Notes (Signed)
Utilization review completed.  

## 2014-12-08 NOTE — Progress Notes (Addendum)
PROGRESS NOTE  Eric [name]:[id number] DOB: June 25, 1926 DOA: 12/07/2014 PCP: [name], MD  HPI/Recap of past 24 hours: Patient is a 81 year old male with past medical history of ischemic cardiomyopathy, diastolic heart failure and diabetes mellitus who just finished a hospitalization for colonic perforation from diverticulitis leading to sigmoid colectomy and Hartman's pouch with abdominal wound left open to heal and transferred to skilled nursing facility on 6/29.  According to patient, wound care not being done consistently at nursing facility. Patient was brought in on evening of 7/14 increased abdominal pain plus drainage. In the emergency room, white blood cell count elevated at 17. CT scan noted stable wound, but questionable healthcare associated pneumonia. Patient had admitted to increased cough over the past week. Patient evaluate by general surgery who felt that wound was at its line and continued dressing changes recommended. Patient with hospital service  The following day, white blood cell count much improved. Patient afebrile. Still complaining of pain in his abdomen.  Assessment/Plan: Present on Admission:  . Essential hypertension: Blood pressure stable.  . Coronary atherosclerosis . Leukocytosis: Secondary to pneumonia. Improving.  . Diastolic dysfunction, Grade 1: Currently euvolemic. Watching volumes very closely  . HCAP (healthcare-associated pneumonia): Principal problem. Continue antibiotics. Suspect cause is poor inspiratory effort. In that incentive spirometry and continue to encourage effort.  . Obesity (BMI 30-39.9): Patient lives criteria with BMI greater than 30  Perforated sigmoid colon status post Hartman's pouch and sigmoid colectomy with open wound: Surgery following. Wound itself observed and looks to be healing well. Eventual wound VAC.  Diabetes mellitus: Sliding scale. Sugars well controlled.   Code Status: Full code  Family  Communication: Left message with daughter  Disposition Plan: Improved. Transfer to floor. We'll discuss with general surgery and likely patient will need to go to a skilled nursing facility, patient's preference is to go somewhere different he was not happy with his care there   Consultants:  General surgery  Procedures:  None  Antibiotics:  IV Zosyn 7/1-present  IV vancomycin 7/1-present   Objective: BP 125/51 mmHg  Pulse 76  Temp(Src) 98.3 F (36.8 C) (Oral)  Resp 18  Ht 5\' 6"  (1.676 m)  Wt 93.4 kg (205 lb 14.6 oz)  BMI 33.25 kg/m2  SpO2 98%  Intake/Output Summary (Last 24 hours) at 12/08/14 1321 Last data filed at 12/08/14 0948  Gross per 24 hour  Intake    490 ml  Output    550 ml  Net    -60 ml   Filed Weights   12/08/14 0013  Weight: 93.4 kg (205 lb 14.6 oz)    Exam:   General:  Alert and oriented 3, mild distress secondary to abdominal pain  Cardiovascular: Regular rate and rhythm, S1-S2  Respiratory: Scattered rales, poor inspiratory effort  Abdomen: Noted ostomy pouch, wound dressing removed and wound observed. Noted good granulation tissue, no active discharge  Musculoskeletal: No clubbing or cyanosis, trace edema   Data Reviewed: Basic Metabolic Panel:  Recent Labs Lab 12/07/14 1732 12/08/14 0350  NA 135 136  K 4.1 4.0  CL 99* 99*  CO2 28 29  GLUCOSE 127* 117*  BUN 11 10  CREATININE 1.01 0.88  CALCIUM 10.3 10.3   Liver Function Tests:  Recent Labs Lab 12/08/14 0350  AST 18  ALT 18  ALKPHOS 62  BILITOT 0.7  PROT 6.3*  ALBUMIN 3.1*   No results for input(s): LIPASE, AMYLASE in the last 168 hours. No results for input(s): AMMONIA in  the last 168 hours. CBC:  Recent Labs Lab 12/07/14 1732 12/08/14 0350  WBC 17.3* 12.0*  NEUTROABS 14.5* 9.5*  HGB 10.8* 10.7*  HCT 33.5* 33.4*  MCV 98.2 98.8  PLT 267 276   Cardiac Enzymes:   No results for input(s): CKTOTAL, CKMB, CKMBINDEX, TROPONINI in the last 168 hours. BNP  (last 3 results) No results for input(s): BNP in the last 8760 hours.  ProBNP (last 3 results)  Recent Labs  02/23/14 1232  PROBNP 35.0    CBG:  Recent Labs Lab 12/03/14 2223 12/04/14 0724 12/04/14 1223 12/08/14 0036 12/08/14 1202  GLUCAP 138* 300* 138* 104* 115*    Recent Results (from the past 240 hour(s))  Culture, blood (routine x 2)     Status: None (Preliminary result)   Collection Time: 12/07/14  8:55 PM  Result Value Ref Range Status   Specimen Description BLOOD RAC  Final   Special Requests BOTTLES DRAWN AEROBIC AND ANAEROBIC 5CC  Final   Culture   Final    NO GROWTH < 24 HOURS Performed at [facility]    Report Status PENDING  Incomplete  Culture, blood (routine x 2)     Status: None (Preliminary result)   Collection Time: 12/07/14  9:05 PM  Result Value Ref Range Status   Specimen Description BLOOD RFARM  Final   Special Requests BOTTLES DRAWN AEROBIC ONLY 3CC  Final   Culture   Final    NO GROWTH < 24 HOURS Performed at [facility]    Report Status PENDING  Incomplete  MRSA PCR Screening     Status: None   Collection Time: 12/08/14 12:25 AM  Result Value Ref Range Status   MRSA by PCR NEGATIVE NEGATIVE Final    Comment:        The GeneXpert MRSA Assay (FDA approved for NASAL specimens only), is one component of a comprehensive MRSA colonization surveillance program. It is not intended to diagnose MRSA infection nor to guide or monitor treatment for MRSA infections.   Culture, sputum-assessment     Status: None   Collection Time: 12/08/14  2:09 AM  Result Value Ref Range Status   Specimen Description SPU  Final   Special Requests NONE  Final   Sputum evaluation   Final    THIS SPECIMEN IS ACCEPTABLE. RESPIRATORY CULTURE REPORT TO FOLLOW.   Report Status 12/08/2014 FINAL  Final     Studies: Ct Abdomen Pelvis W Contrast  12/07/2014   CLINICAL DATA:  Large wound dehiscence at the mid abdomen, status post recent surgery.  Worsening pain and swelling at the wound. Initial encounter.  EXAM: CT ABDOMEN AND PELVIS WITH CONTRAST  TECHNIQUE: Multidetector CT imaging of the abdomen and pelvis was performed using the standard protocol following bolus administration of intravenous contrast.  CONTRAST:  16mL OMNIPAQUE IOHEXOL 300 MG/ML  SOLN  COMPARISON:  CT of the abdomen and pelvis from 11/26/2014  FINDINGS: Mild right basilar opacity may reflect atelectasis or possibly mild pneumonia. Diffuse coronary artery calcifications are seen. Pacemaker leads are partially imaged. Postoperative change is seen anterior to the gastroesophageal junction.  Scattered hepatic cysts are again seen, measuring up to 4.4 cm in size. The spleen is unremarkable in appearance. The gallbladder is within normal limits. The pancreas and adrenal glands are unremarkable.  Scattered small bilateral renal cysts measure up to 2.7 cm in size. There is no evidence of hydronephrosis. No renal or ureteral stones are seen. No perinephric stranding is appreciated.  A large wound dehiscence is noted along the anterior midline abdominal wall, with mild associated soft tissue inflammation and minimal adjacent soft tissue air. There is no evidence of abscess formation at this time. Minimal adjacent intraperitoneal soft tissue stranding is likely postoperative in nature.  No free fluid is identified. The small bowel is unremarkable in appearance. The stomach is within normal limits. No acute vascular abnormalities are seen. Diffuse calcification is noted along the abdominal aorta and its branches, including at the proximal superior mesenteric artery and proximal renal arteries bilaterally.  The appendix is normal in caliber, without evidence of appendicitis. Residual contrast is seen within the colon, to the level of the patient's left lower quadrant colostomy. The colostomy is unremarkable in appearance.  The patient's Hartmann's pouch is unremarkable in appearance, with a small  amount of residual contrast seen.  The bladder is mildly distended and grossly unremarkable. Scattered brachytherapy seeds are seen at the prostate bed. No inguinal lymphadenopathy is seen. A penile implant and reservoir are partially imaged.  No acute osseous abnormalities are identified. The patient is status post right hip arthroplasty. Multilevel vacuum phenomenon is noted along the lumbar spine.  IMPRESSION: 1. Large wound dehiscence along the anterior midline abdominal wall, with mild associated soft tissue inflammation and minimal adjacent soft tissue air. No evidence of abscess formation at this time. Minimal adjacent intraperitoneal soft tissue stranding is likely postoperative in nature. 2. Mild right basilar airspace opacity may reflect atelectasis or possibly mild pneumonia. 3. Diffuse coronary artery calcifications seen. 4. Scattered hepatic and bilateral renal cysts seen. 5. Diffuse calcification along the abdominal aorta and its branches, including at the proximal superior mesenteric artery and proximal renal arteries bilaterally. 6. Left lower quadrant colostomy is unremarkable in appearance. 7. Minimal degenerative change along the lumbar spine.   Electronically Signed   By: Garald Balding M.D.   On: 12/07/2014 20:52   Dg Chest Port 1 View  12/07/2014   CLINICAL DATA:  81 year old male with recent surgery presenting with leukocytosis.  EXAM: PORTABLE CHEST - 1 VIEW  COMPARISON:  Chest x-ray dated 12/02/2014  FINDINGS: Single-view of the chest demonstrates mild emphysematous changes. Minimal bibasilar subsegmental atelectatic changes noted. No focal consolidation or pneumothorax. No pleural effusion. The cardiomediastinal silhouette is within normal limits. Median sternotomy wires, CABG clips, and the left pectoral pacemaker device noted. The osseous structures are grossly unremarkable.  IMPRESSION: No acute cardiopulmonary process.   Electronically Signed   By: Anner Crete M.D.   On:  12/07/2014 20:23    Scheduled Meds: . antiseptic oral rinse  7 mL Mouth Rinse q12n4p  . aspirin EC  81 mg Oral Daily  . chlorhexidine  15 mL Mouth Rinse BID  . chlorthalidone  25 mg Oral Daily  . enoxaparin (LOVENOX) injection  40 mg Subcutaneous Q24H  . feeding supplement (RESOURCE BREEZE)  1 Container Oral BID BM  . fluticasone  2 spray Each Nare Daily  . insulin aspart  0-15 Units Subcutaneous TID WC  . insulin aspart  0-5 Units Subcutaneous QHS  . irbesartan  150 mg Oral Daily  . isosorbide mononitrate  30 mg Oral Daily  . piperacillin-tazobactam (ZOSYN)  IV  3.375 g Intravenous Q8H  . pravastatin  60 mg Oral Daily  . vancomycin  750 mg Intravenous Q12H    Continuous Infusions:    Time spent: 25 min  [facility] Pager [phone]. If 7PM-7AM, please contact night-coverage at [email or web address], password [facility] 12/08/2014, 1:21 PM  LOS: 1 day

## 2014-12-08 NOTE — Progress Notes (Signed)
Subjective: No n/v. Incisional pain. Not too hungry. Cont to express frustration about [employer] - conflicting advice from them about his activity - get up out and bed and walk - but no assistance to help him out of bed, and then he was ambulating in hall without assist - "it's like i committed a crime".   Objective: Vital signs in last 24 hours: Temp:  [97.9 F (36.6 C)-98.4 F (36.9 C)] 98.3 F (36.8 C) (07/02 0800) Pulse Rate:  [60-88] 80 (07/02 0622) Resp:  [16-23] 16 (07/02 0622) BP: (105-141)/(39-76) 105/48 mmHg (07/02 0622) SpO2:  [88 %-98 %] 98 % (07/02 0622) Weight:  [93.4 kg (205 lb 14.6 oz)] 93.4 kg (205 lb 14.6 oz) (07/02 0013) Last BM Date: 12/08/14  Intake/Output from previous day: 07/01 0701 - 07/02 0700 In: 200 [IV Piggyback:200] Out: 550 [Urine:550] Intake/Output this shift:    Upper airway cough/congestion Reg Obese, soft, min to nontender, ostomy intact  Lab Results:   Recent Labs  12/07/14 1732 12/08/14 0350  WBC 17.3* 12.0*  HGB 10.8* 10.7*  HCT 33.5* 33.4*  PLT 267 276   BMET  Recent Labs  12/07/14 1732 12/08/14 0350  NA 135 136  K 4.1 4.0  CL 99* 99*  CO2 28 29  GLUCOSE 127* 117*  BUN 11 10  CREATININE 1.01 0.88  CALCIUM 10.3 10.3   PT/INR No results for input(s): LABPROT, INR in the last 72 hours. ABG No results for input(s): PHART, HCO3 in the last 72 hours.  Invalid input(s): PCO2, PO2  Studies/Results: Ct Abdomen Pelvis W Contrast  12/07/2014   CLINICAL DATA:  Large wound dehiscence at the mid abdomen, status post recent surgery. Worsening pain and swelling at the wound. Initial encounter.  EXAM: CT ABDOMEN AND PELVIS WITH CONTRAST  TECHNIQUE: Multidetector CT imaging of the abdomen and pelvis was performed using the standard protocol following bolus administration of intravenous contrast.  CONTRAST:  166mL OMNIPAQUE IOHEXOL 300 MG/ML  SOLN  COMPARISON:  CT of the abdomen and pelvis from 11/26/2014  FINDINGS: Mild right  basilar opacity may reflect atelectasis or possibly mild pneumonia. Diffuse coronary artery calcifications are seen. Pacemaker leads are partially imaged. Postoperative change is seen anterior to the gastroesophageal junction.  Scattered hepatic cysts are again seen, measuring up to 4.4 cm in size. The spleen is unremarkable in appearance. The gallbladder is within normal limits. The pancreas and adrenal glands are unremarkable.  Scattered small bilateral renal cysts measure up to 2.7 cm in size. There is no evidence of hydronephrosis. No renal or ureteral stones are seen. No perinephric stranding is appreciated.  A large wound dehiscence is noted along the anterior midline abdominal wall, with mild associated soft tissue inflammation and minimal adjacent soft tissue air. There is no evidence of abscess formation at this time. Minimal adjacent intraperitoneal soft tissue stranding is likely postoperative in nature.  No free fluid is identified. The small bowel is unremarkable in appearance. The stomach is within normal limits. No acute vascular abnormalities are seen. Diffuse calcification is noted along the abdominal aorta and its branches, including at the proximal superior mesenteric artery and proximal renal arteries bilaterally.  The appendix is normal in caliber, without evidence of appendicitis. Residual contrast is seen within the colon, to the level of the patient's left lower quadrant colostomy. The colostomy is unremarkable in appearance.  The patient's Hartmann's pouch is unremarkable in appearance, with a small amount of residual contrast seen.  The bladder is mildly distended and  grossly unremarkable. Scattered brachytherapy seeds are seen at the prostate bed. No inguinal lymphadenopathy is seen. A penile implant and reservoir are partially imaged.  No acute osseous abnormalities are identified. The patient is status post right hip arthroplasty. Multilevel vacuum phenomenon is noted along the lumbar  spine.  IMPRESSION: 1. Large wound dehiscence along the anterior midline abdominal wall, with mild associated soft tissue inflammation and minimal adjacent soft tissue air. No evidence of abscess formation at this time. Minimal adjacent intraperitoneal soft tissue stranding is likely postoperative in nature. 2. Mild right basilar airspace opacity may reflect atelectasis or possibly mild pneumonia. 3. Diffuse coronary artery calcifications seen. 4. Scattered hepatic and bilateral renal cysts seen. 5. Diffuse calcification along the abdominal aorta and its branches, including at the proximal superior mesenteric artery and proximal renal arteries bilaterally. 6. Left lower quadrant colostomy is unremarkable in appearance. 7. Minimal degenerative change along the lumbar spine.   Electronically Signed   By: Garald Balding M.D.   On: 12/07/2014 20:52   Dg Chest Port 1 View  12/07/2014   CLINICAL DATA:  81 year old male with recent surgery presenting with leukocytosis.  EXAM: PORTABLE CHEST - 1 VIEW  COMPARISON:  Chest x-ray dated 12/02/2014  FINDINGS: Single-view of the chest demonstrates mild emphysematous changes. Minimal bibasilar subsegmental atelectatic changes noted. No focal consolidation or pneumothorax. No pleural effusion. The cardiomediastinal silhouette is within normal limits. Median sternotomy wires, CABG clips, and the left pectoral pacemaker device noted. The osseous structures are grossly unremarkable.  IMPRESSION: No acute cardiopulmonary process.   Electronically Signed   By: Anner Crete M.D.   On: 12/07/2014 20:23    Anti-infectives: Anti-infectives    Start     Dose/Rate Route Frequency Ordered Stop   12/08/14 0100  piperacillin-tazobactam (ZOSYN) IVPB 3.375 g     3.375 g 12.5 mL/hr over 240 Minutes Intravenous Every 8 hours 12/08/14 0012     12/08/14 0100  vancomycin (VANCOCIN) IVPB 750 mg/150 ml premix     750 mg 150 mL/hr over 60 Minutes Intravenous Every 12 hours 12/08/14 0031         Assessment/Plan: Sigmoid diverticulitis with perf s/p Hartman's procedure 6/22 by Dr Zella Richer HTN Leukocytosis Diastolic dysfunction  Agree with resource &/or boost bt meals Cont wet to dry dressing bid for now. Wound needs to packed to base of wound. Will discuss with nurse logistics of wound vac over the weekend Needs aggressive pulm toilet - is, flutter valve oob to chair Pt/ot ID - abx per triad. No intra-abd infection. Doesn't necessarily need for open wound.  Social work consult  [place] to transfer to floor from my perspective  Leighton Ruff. Redmond Pulling, MD, FACS General, Bariatric, & Minimally Invasive Surgery [facility], [place]    LOS: 1 day    Gayland Curry 12/08/2014

## 2014-12-08 NOTE — Consult Note (Addendum)
Patient known to me from previous admission.   WOC ostomy consult note Stoma type/location: LLQ Colostomy Stomal assessment/size: 1 and 5/8 inch oval stoma, minimally budded Peristomal assessment: Mucocutaneous separation from 5 o'clock to 9 o'clock Treatment options for stomal/peristomal skin: Skin barrier ring and convex pouching system Output brown stool Ostomy pouching: 1 piece convex pouching system with skin barrier ring Education provided: Patient is distraught today; upset that he must perform self care that is difficult to him and that it will be several times each day (emptying) and two-to-three times each week (changing).  His wound care and ostomy pouch emptying are overwhelming him and he does not feel as if he received adequate care at the Rehab facility.  In fact, he says that while they did a poor job of caring for his wound, they absolutely avoided taking care of his ostomy until the pouches wound burst and that the odor was terrible. He still is confused about what is on his abdomen:  He says his wound is infected and leaking stool.  I have attempted to reorient him several times that he has a wound and an ostomy, but today this is very upsetting. Enrolled patient in [place] program: Yes (last admission)  WOC wound consult note Reason for Consult: Midline surgical wound (full thickness) Wound type:Surgical Pressure Ulcer POA: No Measurement:17cm x 6cm x 6.5cm Wound bed: 75% pink, dry, 25% necrotic (10% on right lateral edge and 15% in base) Drainage (amount, consistency, odor) Serosanguinous with blue-tinged exudate in base. Periwound:intact. Dressing procedure/placement/frequency: I agree with twice daily saline dressings at this time; tissue is not ready for negative pressure wound therapy with the amount of necrotic tissue currently.  NB:  Patient requested to have NPWT discontinued at end of last admission as he did not like and tolerated the sensation  poorly.  Patient reports poor nutritional intake (enough to feel satisfied, but no more) and is very worried about finances, specifically asking about the difference in Rehab vs having daily nurses at home.  [place] nursing team will follow, and will remain available to this patient, the nursing, surgical and medical teams.   Thanks, Maudie Flakes, MSN, RN, [place], [place], [place] [phone])

## 2014-12-09 DIAGNOSIS — I10 Essential (primary) hypertension: Secondary | ICD-10-CM

## 2014-12-09 LAB — CBC
HCT: 32.8 % — ABNORMAL LOW (ref 39.0–52.0)
Hemoglobin: 10.5 g/dL — ABNORMAL LOW (ref 13.0–17.0)
MCH: 31.2 pg (ref 26.0–34.0)
MCHC: 32 g/dL (ref 30.0–36.0)
MCV: 97.3 fL (ref 78.0–100.0)
PLATELETS: 280 10*3/uL (ref 150–400)
RBC: 3.37 MIL/uL — ABNORMAL LOW (ref 4.22–5.81)
RDW: 12.3 % (ref 11.5–15.5)
WBC: 12.8 10*3/uL — AB (ref 4.0–10.5)

## 2014-12-09 LAB — BASIC METABOLIC PANEL
Anion gap: 8 (ref 5–15)
BUN: 15 mg/dL (ref 6–20)
CALCIUM: 10.3 mg/dL (ref 8.9–10.3)
CHLORIDE: 97 mmol/L — AB (ref 101–111)
CO2: 28 mmol/L (ref 22–32)
CREATININE: 1.17 mg/dL (ref 0.61–1.24)
GFR calc non Af Amer: 55 mL/min — ABNORMAL LOW (ref >60)
GLUCOSE: 173 mg/dL — AB (ref 65–99)
POTASSIUM: 3.5 mmol/L (ref 3.5–5.1)
Sodium: 133 mmol/L — ABNORMAL LOW (ref 135–145)

## 2014-12-09 LAB — GLUCOSE, CAPILLARY
Glucose-Capillary: 120 mg/dL — ABNORMAL HIGH (ref 65–99)
Glucose-Capillary: 141 mg/dL — ABNORMAL HIGH (ref 65–99)
Glucose-Capillary: 114 mg/dL — ABNORMAL HIGH (ref 65–99)

## 2014-12-09 LAB — URINE CULTURE: Culture: 7000

## 2014-12-09 LAB — VANCOMYCIN, TROUGH: Vancomycin Tr: 11 ug/mL (ref 10.0–20.0)

## 2014-12-09 NOTE — Progress Notes (Signed)
Occupational Therapy Evaluation Patient Details Name: Eric [name] MRN: [id number] DOB: 1927/01/13 Today's Date: 12/09/2014    History of Present Illness s/p Exploratory laparotomy, sigmoid colectomy, descending colostomy on 11/28/14, DC to SNF 6/28, returned 7/1 with dehiscence of wound.   Clinical Impression   Patient presents with decreased ADL independence due to the functional limitations below. The main issue is whether or not he can take care of his ostomy independently. Patient wishes to return home but he lives alone and would need to be able to manage IADLs (medication management, cleaning, laundry, meal prep) in addition to taking care of his ostomy. Will continue to follow.    Follow Up Recommendations  SNF;Supervision/Assistance - 24 hour (though if patient refuses SNF, recommend HHOT)    Equipment Recommendations       Recommendations for Other Services PT consult     Precautions / Restrictions Precautions Precaution Comments: recent ABD surgery and L colostomy Restrictions Weight Bearing Restrictions: No      Mobility Bed Mobility                  Transfers Overall transfer level: Needs assistance Equipment used: Rolling walker (2 wheeled) Transfers: Sit to/from Stand Sit to Stand: Supervision              Balance                                            ADL Overall ADL's : Needs assistance/impaired Eating/Feeding: Set up;Sitting   Grooming: Wash/dry hands;Wash/dry face;Oral care;Set up;Sitting   Upper Body Bathing: Minimal assitance;Sitting   Lower Body Bathing: Minimal assistance;Sit to/from stand   Upper Body Dressing : Minimal assistance;Sitting   Lower Body Dressing: Minimal assistance;Sit to/from stand       Toileting- Water quality scientist and Hygiene: Maximal assistance;Total assistance (manage ostomy)       Functional mobility during ADLs: Supervision/safety;Rolling walker General ADL Comments:  Patient received ambulating in halls with CNA. Agreeable to OT evaluation. Patient would really like to go hom, but he lives alone. He was able to bring his legs up to him to don/doff slippers from a seated position. At his apartment, he has a walk-in shower with grab bars and a shower seat, standard toilet but can use vanity to push up. He is mildly unsteady during ambulation but no overt LOB. Spoke to his nurse who reports he is learning about his ostomy but not independent with managing it at this time.      Vision     Perception     Praxis      Pertinent Vitals/Pain Pain Assessment: 0-10 Pain Score: 7  Pain Location: abdomen Pain Descriptors / Indicators: Discomfort Pain Intervention(s): Limited activity within patient's tolerance;Monitored during session     Hand Dominance Right   Extremity/Trunk Assessment Upper Extremity Assessment Upper Extremity Assessment: Overall WFL for tasks assessed   Lower Extremity Assessment Lower Extremity Assessment: Overall WFL for tasks assessed       Communication Communication Communication: No difficulties   Cognition Arousal/Alertness: Awake/alert Behavior During Therapy: WFL for tasks assessed/performed;Anxious Overall Cognitive Status: Within Functional Limits for tasks assessed                     General Comments       Exercises       Shoulder Instructions  Home Living Family/patient expects to be discharged to:: Other (Comment) (([facility] independent apartments)) Living Arrangements: Alone   Type of Home: [place]: Environmental consultant - 2 wheels;Shower seat - built in;Grab bars - tub/shower   Additional Comments: daughters A with groceries, pt does own meal prep and manages own medications per his report      Prior Functioning/Environment Level of Independence: Needs assistance  Gait / Transfers Assistance Needed: was in snf, reorts he ambulated with RW  independently ADL's / Homemaking Assistance Needed: needed A with managing wound and ostomy, some BADLs, IADLs        OT Diagnosis: Generalized weakness   OT Problem List: Decreased strength;Decreased activity tolerance;Impaired balance (sitting and/or standing);Decreased knowledge of use of DME or AE;Pain   OT Treatment/Interventions: Self-care/ADL training;DME and/or AE instruction;Therapeutic activities;Therapeutic exercise;Patient/family education    OT Goals(Current goals can be found in the care plan section) Acute Rehab OT Goals Patient Stated Goal: to go home OT Goal Formulation: With patient Time For Goal Achievement: 12/23/14 Potential to Achieve Goals: Good ADL Goals Pt Will Perform Upper Body Bathing: with modified independence Pt Will Perform Lower Body Bathing: with modified independence Pt Will Perform Upper Body Dressing: with modified independence Pt Will Perform Lower Body Dressing: with modified independence Pt Will Transfer to Toilet: with modified independence Pt Will Perform Toileting - Clothing Manipulation and hygiene: with modified independence;sit to/from stand Pt Will Perform Tub/Shower Transfer: with modified independence  OT Frequency: Min 2X/week   Barriers to D/C: Decreased caregiver support  lives alone       Co-evaluation              End of Session Equipment Utilized During Treatment: Surveyor, mining Communication: Mobility status;Other (comment) (ostomy management)  Activity Tolerance: Patient tolerated treatment well Patient left: in bed;with call bell/phone within reach;with nursing/sitter in room   Time: 1049-1105 OT Time Calculation (min): 16 min Charges:  OT General Charges $OT Visit: 1 Procedure OT Evaluation $Initial OT Evaluation Tier I: 1 Procedure G-Codes:    Eric [name] A 15-Dec-2014, 11:54 AM

## 2014-12-09 NOTE — Progress Notes (Addendum)
PROGRESS NOTE  Eric [name] WJX:[id number] DOB: 1927-02-28 DOA: 12/07/2014 PCP: [name], MD  HPI/Recap of past 24 hours: Patient is a 81 year old male with past medical history of ischemic cardiomyopathy, diastolic heart failure and diabetes mellitus who just finished a hospitalization for colonic perforation from diverticulitis leading to sigmoid colectomy and Hartman's pouch with abdominal wound left open to heal and transferred to skilled nursing facility on 6/29.  According to patient, wound care not being done consistently at nursing facility. Patient was brought in on evening of 7/14 increased abdominal pain plus drainage. In the emergency room, white blood cell count elevated at 17. CT scan noted stable wound, but questionable healthcare associated pneumonia. Patient had admitted to increased cough over the past week. Patient evaluate by general surgery who felt that wound was at its line and continued dressing changes recommended. Patient with hospital service   By the following day, patient had improved significantly. He was transferred to the floor from stepdown. Patient seen by wound care-abdominal wound not yet ready for wound VAC Today he is continues to feel better. Abdominal pain is controlled. Feels like his breathing is easier. His main complaint is of the facility where he had previously was at.    Assessment/Plan: Present on Admission:  . Essential hypertension: Blood pressure stable.  . Coronary atherosclerosis . Leukocytosis: Secondary to pneumonia. Little improvement from yesterday, continue to monitor. No fevers    chronic diastolic heart failure: Currently euvolemic. Watching volumes very closely  . HCAP (healthcare-associated pneumonia): Principal problem. Continue antibiotics. Suspect cause is poor inspiratory effort. Patient started on incentive spirometry, did well  . Obesity (BMI 30-39.9): Patient lives criteria with BMI greater than 30  Perforated  sigmoid colon status post Hartman's pouch and sigmoid colectomy with open wound: Surgery following. Wound itself observed and looks to be healing well. Eventual wound VAC, although not ready yet as per wound care  Diabetes mellitus: Sliding scale. Sugars well controlled, below 150   Code Status: Full code  Family Communication: Left message with daughter  Disposition Plan: Seen by PT recommending short-term skilled nursing versus home with 24-hour care-patient does not have this. Patient is willing to go along with this plan as long as he finds a different facility which will take care of him.   Consultants:  General surgery  Procedures:  None  Antibiotics:  IV Zosyn 7/1-present  IV vancomycin 7/1-present   Objective: BP 102/41 mmHg  Pulse 86  Temp(Src) 98.2 F (36.8 C) (Oral)  Resp 18  Ht 5\' 6"  (1.676 m)  Wt 93.4 kg (205 lb 14.6 oz)  BMI 33.25 kg/m2  SpO2 93%  Intake/Output Summary (Last 24 hours) at 12/09/14 1306 Last data filed at 12/09/14 0758  Gross per 24 hour  Intake    330 ml  Output    200 ml  Net    130 ml   Filed Weights   12/08/14 0013  Weight: 93.4 kg (205 lb 14.6 oz)    Exam:   General:  Alert and oriented 3, no acute distress  Cardiovascular: Regular rate and rhythm, S1-S2  Respiratory: Mostly clear to auscultation bilaterally  Abdomen: Noted ostomy pouch, wound covered   Musculoskeletal: No clubbing or cyanosis, trace edema   Data Reviewed: Basic Metabolic Panel:  Recent Labs Lab 12/07/14 1732 12/08/14 0350 12/09/14 0927  NA 135 136 133*  K 4.1 4.0 3.5  CL 99* 99* 97*  CO2 28 29 28   GLUCOSE 127* 117* 173*  BUN 11  10 15  CREATININE 1.01 0.88 1.17  CALCIUM 10.3 10.3 10.3   Liver Function Tests:  Recent Labs Lab 12/08/14 0350  AST 18  ALT 18  ALKPHOS 62  BILITOT 0.7  PROT 6.3*  ALBUMIN 3.1*   No results for input(s): LIPASE, AMYLASE in the last 168 hours. No results for input(s): AMMONIA in the last 168  hours. CBC:  Recent Labs Lab 12/07/14 1732 12/08/14 0350 12/09/14 0927  WBC 17.3* 12.0* 12.8*  NEUTROABS 14.5* 9.5*  --   HGB 10.8* 10.7* 10.5*  HCT 33.5* 33.4* 32.8*  MCV 98.2 98.8 97.3  PLT 267 276 280   Cardiac Enzymes:   No results for input(s): CKTOTAL, CKMB, CKMBINDEX, TROPONINI in the last 168 hours. BNP (last 3 results) No results for input(s): BNP in the last 8760 hours.  ProBNP (last 3 results)  Recent Labs  02/23/14 1232  PROBNP 35.0    CBG:  Recent Labs Lab 12/08/14 1202 12/08/14 1657 12/08/14 2148 12/09/14 0747 12/09/14 1147  GLUCAP 115* 102* 126* 120* 141*    Recent Results (from the past 240 hour(s))  Urine culture     Status: None   Collection Time: 12/07/14  8:06 PM  Result Value Ref Range Status   Specimen Description URINE, RANDOM  Final   Special Requests NONE  Final   Culture   Final    7,000 COLONIES/mL INSIGNIFICANT GROWTH Performed at [facility]    Report Status 12/09/2014 FINAL  Final  Culture, blood (routine x 2)     Status: None (Preliminary result)   Collection Time: 12/07/14  8:55 PM  Result Value Ref Range Status   Specimen Description BLOOD RAC  Final   Special Requests BOTTLES DRAWN AEROBIC AND ANAEROBIC 5CC  Final   Culture   Final    NO GROWTH < 24 HOURS Performed at [facility]    Report Status PENDING  Incomplete  Culture, blood (routine x 2)     Status: None (Preliminary result)   Collection Time: 12/07/14  9:05 PM  Result Value Ref Range Status   Specimen Description BLOOD RFARM  Final   Special Requests BOTTLES DRAWN AEROBIC ONLY 3CC  Final   Culture   Final    NO GROWTH < 24 HOURS Performed at [facility]    Report Status PENDING  Incomplete  MRSA PCR Screening     Status: None   Collection Time: 12/08/14 12:25 AM  Result Value Ref Range Status   MRSA by PCR NEGATIVE NEGATIVE Final    Comment:        The GeneXpert MRSA Assay (FDA approved for NASAL specimens only), is one  component of a comprehensive MRSA colonization surveillance program. It is not intended to diagnose MRSA infection nor to guide or monitor treatment for MRSA infections.   Culture, sputum-assessment     Status: None   Collection Time: 12/08/14  2:09 AM  Result Value Ref Range Status   Specimen Description SPU  Final   Special Requests NONE  Final   Sputum evaluation   Final    THIS SPECIMEN IS ACCEPTABLE. RESPIRATORY CULTURE REPORT TO FOLLOW.   Report Status 12/08/2014 FINAL  Final  Culture, respiratory (NON-Expectorated)     Status: None (Preliminary result)   Collection Time: 12/08/14  2:09 AM  Result Value Ref Range Status   Specimen Description SPU  Final   Special Requests NONE  Final   Gram Stain PENDING  Incomplete   Culture  Final    ABUNDANT GRAM NEGATIVE RODS Performed at [facility]    Report Status PENDING  Incomplete     Studies: No results found.  Scheduled Meds: . antiseptic oral rinse  7 mL Mouth Rinse q12n4p  . aspirin EC  81 mg Oral Daily  . chlorhexidine  15 mL Mouth Rinse BID  . chlorthalidone  25 mg Oral Daily  . enoxaparin (LOVENOX) injection  40 mg Subcutaneous Q24H  . feeding supplement (RESOURCE BREEZE)  1 Container Oral BID BM  . fluticasone  2 spray Each Nare Daily  . insulin aspart  0-15 Units Subcutaneous TID WC  . insulin aspart  0-5 Units Subcutaneous QHS  . irbesartan  150 mg Oral Daily  . isosorbide mononitrate  30 mg Oral Daily  . piperacillin-tazobactam (ZOSYN)  IV  3.375 g Intravenous Q8H  . pravastatin  60 mg Oral Daily  . vancomycin  750 mg Intravenous Q12H    Continuous Infusions:    Time spent: 15 min  [facility] Pager [phone]. If 7PM-7AM, please contact night-coverage at [email or web address], password [facility] 12/09/2014, 1:06 PM  LOS: 2 days

## 2014-12-09 NOTE — Progress Notes (Signed)
Physical Therapy Treatment Patient Details Name: Eric [name] MRN: [id number] DOB: Jul 11, 1926 Today's Date: 12/09/2014    History of Present Illness s/p Exploratory laparotomy, sigmoid colectomy, descending colostomy on 11/28/14, DC to SNF 6/28, returned 7/1 with dehiscence of wound.    PT Comments    Pt OOB in recliner feeling "okay".  Assisted with amb a great distance in the hallway.  Increased c/o ABD pain "cramping" with activity such that pt requested to go back to bed.  Pain meds also requested.    Follow Up Recommendations  Home health PT (pt refuses SNF and can have family assist )     Equipment Recommendations  Rolling walker with 5" wheels    Recommendations for Other Services       Precautions / Restrictions Precautions Precaution Comments: recent ABD surgery and L colostomy Restrictions Weight Bearing Restrictions: No    Mobility  Bed Mobility Overal bed mobility: Needs Assistance     Sidelying to sit: Supervision       General bed mobility comments: Assisted back to bed  Transfers Overall transfer level: Needs assistance Equipment used: Rolling walker (2 wheeled) Transfers: Sit to/from Stand Sit to Stand: Supervision         General transfer comment: increased time due to ABD pain  Ambulation/Gait Ambulation/Gait assistance: Supervision;Min guard Ambulation Distance (Feet): 225 Feet Assistive device: Rolling walker (2 wheeled) Gait Pattern/deviations: Step-through pattern;Decreased stride length;Trunk flexed Gait velocity: decreased   General Gait Details: walker for increased safety and due to recent ABD surgery   Stairs            Wheelchair Mobility    Modified Rankin (Stroke Patients Only)       Balance                                    Cognition Arousal/Alertness: Awake/alert Behavior During Therapy: WFL for tasks assessed/performed;Anxious Overall Cognitive Status: Within Functional Limits for tasks  assessed                      Exercises      General Comments        Pertinent Vitals/Pain Pain Assessment: 0-10 Pain Score: 8  Pain Location: ABD Pain Descriptors / Indicators: Constant;Cramping Pain Intervention(s): Monitored during session;Repositioned;Patient requesting pain meds-RN notified    Home Living                      Prior Function            PT Goals (current goals can now be found in the care plan section) Progress towards PT goals: Progressing toward goals    Frequency  Min 3X/week    PT Plan Current plan remains appropriate    Co-evaluation             End of Session Equipment Utilized During Treatment: Gait belt Activity Tolerance: Patient tolerated treatment well Patient left: in bed;with call bell/phone within reach     Time: 1418-1436 PT Time Calculation (min) (ACUTE ONLY): 18 min  Charges:  $Gait Training: 8-22 mins                    G Codes:      Rica Koyanagi  PTA [facility]  Acute  Rehab Pager      [phone]

## 2014-12-09 NOTE — Progress Notes (Signed)
Subjective: No real c/o. abd less tender.   Objective: Vital signs in last 24 hours: Temp:  [97.9 F (36.6 C)-98.4 F (36.9 C)] 98.2 F (36.8 C) (07/03 1000) Pulse Rate:  [75-89] 86 (07/03 1000) Resp:  [18] 18 (07/03 1000) BP: (102-125)/(41-62) 102/41 mmHg (07/03 1000) SpO2:  [92 %-98 %] 93 % (07/03 1000) Last BM Date: 12/08/14  Intake/Output from previous day: 07/02 0701 - 07/03 0700 In: 500 [P.O.:450; IV Piggyback:50] Out: 200 [Urine:200] Intake/Output this shift: Total I/O In: 120 [P.O.:120] Out: -   Alert, nontoxic approp Just had wound changed. Ostomy functioning. soft  Lab Results:   Recent Labs  12/08/14 0350 12/09/14 0927  WBC 12.0* 12.8*  HGB 10.7* 10.5*  HCT 33.4* 32.8*  PLT 276 280   BMET  Recent Labs  12/08/14 0350 12/09/14 0927  NA 136 133*  K 4.0 3.5  CL 99* 97*  CO2 29 28  GLUCOSE 117* 173*  BUN 10 15  CREATININE 0.88 1.17  CALCIUM 10.3 10.3   PT/INR No results for input(s): LABPROT, INR in the last 72 hours. ABG No results for input(s): PHART, HCO3 in the last 72 hours.  Invalid input(s): PCO2, PO2  Studies/Results: Ct Abdomen Pelvis W Contrast  12/07/2014   CLINICAL DATA:  Large wound dehiscence at the mid abdomen, status post recent surgery. Worsening pain and swelling at the wound. Initial encounter.  EXAM: CT ABDOMEN AND PELVIS WITH CONTRAST  TECHNIQUE: Multidetector CT imaging of the abdomen and pelvis was performed using the standard protocol following bolus administration of intravenous contrast.  CONTRAST:  144mL OMNIPAQUE IOHEXOL 300 MG/ML  SOLN  COMPARISON:  CT of the abdomen and pelvis from 11/26/2014  FINDINGS: Mild right basilar opacity may reflect atelectasis or possibly mild pneumonia. Diffuse coronary artery calcifications are seen. Pacemaker leads are partially imaged. Postoperative change is seen anterior to the gastroesophageal junction.  Scattered hepatic cysts are again seen, measuring up to 4.4 cm in size. The  spleen is unremarkable in appearance. The gallbladder is within normal limits. The pancreas and adrenal glands are unremarkable.  Scattered small bilateral renal cysts measure up to 2.7 cm in size. There is no evidence of hydronephrosis. No renal or ureteral stones are seen. No perinephric stranding is appreciated.  A large wound dehiscence is noted along the anterior midline abdominal wall, with mild associated soft tissue inflammation and minimal adjacent soft tissue air. There is no evidence of abscess formation at this time. Minimal adjacent intraperitoneal soft tissue stranding is likely postoperative in nature.  No free fluid is identified. The small bowel is unremarkable in appearance. The stomach is within normal limits. No acute vascular abnormalities are seen. Diffuse calcification is noted along the abdominal aorta and its branches, including at the proximal superior mesenteric artery and proximal renal arteries bilaterally.  The appendix is normal in caliber, without evidence of appendicitis. Residual contrast is seen within the colon, to the level of the patient's left lower quadrant colostomy. The colostomy is unremarkable in appearance.  The patient's Hartmann's pouch is unremarkable in appearance, with a small amount of residual contrast seen.  The bladder is mildly distended and grossly unremarkable. Scattered brachytherapy seeds are seen at the prostate bed. No inguinal lymphadenopathy is seen. A penile implant and reservoir are partially imaged.  No acute osseous abnormalities are identified. The patient is status post right hip arthroplasty. Multilevel vacuum phenomenon is noted along the lumbar spine.  IMPRESSION: 1. Large wound dehiscence along the anterior midline abdominal wall,  with mild associated soft tissue inflammation and minimal adjacent soft tissue air. No evidence of abscess formation at this time. Minimal adjacent intraperitoneal soft tissue stranding is likely postoperative in  nature. 2. Mild right basilar airspace opacity may reflect atelectasis or possibly mild pneumonia. 3. Diffuse coronary artery calcifications seen. 4. Scattered hepatic and bilateral renal cysts seen. 5. Diffuse calcification along the abdominal aorta and its branches, including at the proximal superior mesenteric artery and proximal renal arteries bilaterally. 6. Left lower quadrant colostomy is unremarkable in appearance. 7. Minimal degenerative change along the lumbar spine.   Electronically Signed   By: Garald Balding M.D.   On: 12/07/2014 20:52   Dg Chest Port 1 View  12/07/2014   CLINICAL DATA:  81 year old male with recent surgery presenting with leukocytosis.  EXAM: PORTABLE CHEST - 1 VIEW  COMPARISON:  Chest x-ray dated 12/02/2014  FINDINGS: Single-view of the chest demonstrates mild emphysematous changes. Minimal bibasilar subsegmental atelectatic changes noted. No focal consolidation or pneumothorax. No pleural effusion. The cardiomediastinal silhouette is within normal limits. Median sternotomy wires, CABG clips, and the left pectoral pacemaker device noted. The osseous structures are grossly unremarkable.  IMPRESSION: No acute cardiopulmonary process.   Electronically Signed   By: Anner Crete M.D.   On: 12/07/2014 20:23    Anti-infectives: Anti-infectives    Start     Dose/Rate Route Frequency Ordered Stop   12/08/14 0100  piperacillin-tazobactam (ZOSYN) IVPB 3.375 g     3.375 g 12.5 mL/hr over 240 Minutes Intravenous Every 8 hours 12/08/14 0012     12/08/14 0100  vancomycin (VANCOCIN) IVPB 750 mg/150 ml premix     750 mg 150 mL/hr over 60 Minutes Intravenous Every 12 hours 12/08/14 0031        Assessment/Plan: Principal Problem:   HCAP (healthcare-associated pneumonia) Active Problems:   Type 2 diabetes mellitus, controlled   Essential hypertension   Coronary atherosclerosis   Leukocytosis   Diastolic dysfunction, Grade 1   Obesity (BMI 30-39.9)   Hartmann's pouch of  intestine  Agree with wound care -that wound not ready for wound vac and apparently pt did not like it Cont to encourage PO, shakes as tolerated Pt/ot  Leighton Ruff. Redmond Pulling, MD, FACS General, Bariatric, & Minimally Invasive Surgery [facility], [place]    LOS: 2 days    Gayland Curry 12/09/2014

## 2014-12-09 NOTE — Consult Note (Signed)
WOC ostomy follow up Stoma type/location: LLQ, colostomy Output pasty, brown stool  Ostomy pouching: 1pc.convex placed by WOC yesterday, intact  Education provided:  Pt very anxious today about the pouch and his midline wound.  Reports issues to this WOC with his stay at SNF. Wishes to go home at DC.   Today we practiced expelling gas from the pouch and emptying stool from the pouch.  He opened and closed the pouch several times and assisted with both removing the stool and gas from the pouch.  We reviewed the steps of the pouch change from yesterday but he really did not recall these steps today at all.  We discussed that he should have no odor unless he opens the pouch.  He is concerned about discharge.  He reports he has children but they will not be able to assist him in his care as several of them are quite disabled themselves.  He lives in senior citizen apartments and reports he may be able to get some help from his friend there.     [place] team will continue to follow along with you for support with ostomy care and education.  Zedrick Springsteen Elwood RN,CWOCN [phone]

## 2014-12-10 ENCOUNTER — Encounter: Payer: Self-pay | Admitting: Internal Medicine

## 2014-12-10 ENCOUNTER — Inpatient Hospital Stay (HOSPITAL_COMMUNITY): Payer: Medicare Other

## 2014-12-10 DIAGNOSIS — I5032 Chronic diastolic (congestive) heart failure: Secondary | ICD-10-CM

## 2014-12-10 DIAGNOSIS — M549 Dorsalgia, unspecified: Secondary | ICD-10-CM | POA: Insufficient documentation

## 2014-12-10 DIAGNOSIS — D72829 Elevated white blood cell count, unspecified: Secondary | ICD-10-CM

## 2014-12-10 LAB — CBC
HEMATOCRIT: 31.7 % — AB (ref 39.0–52.0)
Hemoglobin: 10.1 g/dL — ABNORMAL LOW (ref 13.0–17.0)
MCH: 31.4 pg (ref 26.0–34.0)
MCHC: 31.9 g/dL (ref 30.0–36.0)
MCV: 98.4 fL (ref 78.0–100.0)
PLATELETS: 331 10*3/uL (ref 150–400)
RBC: 3.22 MIL/uL — AB (ref 4.22–5.81)
RDW: 12.4 % (ref 11.5–15.5)
WBC: 13 10*3/uL — AB (ref 4.0–10.5)

## 2014-12-10 LAB — LEGIONELLA ANTIGEN, URINE

## 2014-12-10 LAB — BASIC METABOLIC PANEL
Anion gap: 8 (ref 5–15)
BUN: 21 mg/dL — AB (ref 6–20)
CALCIUM: 10.3 mg/dL (ref 8.9–10.3)
CO2: 27 mmol/L (ref 22–32)
CREATININE: 1.21 mg/dL (ref 0.61–1.24)
Chloride: 99 mmol/L — ABNORMAL LOW (ref 101–111)
GFR calc Af Amer: >60 mL/min (ref >60)
GFR, EST NON AFRICAN AMERICAN: 53 mL/min — AB (ref >60)
GLUCOSE: 121 mg/dL — AB (ref 65–99)
POTASSIUM: 3.8 mmol/L (ref 3.5–5.1)
SODIUM: 134 mmol/L — AB (ref 135–145)

## 2014-12-10 LAB — GLUCOSE, CAPILLARY
GLUCOSE-CAPILLARY: 135 mg/dL — AB (ref 65–99)
GLUCOSE-CAPILLARY: 116 mg/dL — AB (ref 65–99)
Glucose-Capillary: 125 mg/dL — ABNORMAL HIGH (ref 65–99)
Glucose-Capillary: 122 mg/dL — ABNORMAL HIGH (ref 65–99)
Glucose-Capillary: 82 mg/dL (ref 65–99)

## 2014-12-10 LAB — CULTURE, RESPIRATORY W GRAM STAIN: Gram Stain: NONE SEEN

## 2014-12-10 LAB — CULTURE, RESPIRATORY

## 2014-12-10 LAB — BRAIN NATRIURETIC PEPTIDE: B Natriuretic Peptide: 32.7 pg/mL (ref 0.0–100.0)

## 2014-12-10 MED ORDER — VANCOMYCIN HCL IN DEXTROSE 1-5 GM/200ML-% IV SOLN
1000.0000 mg | Freq: Two times a day (BID) | INTRAVENOUS | Status: DC
  Administered 2014-12-10: 1000 mg via INTRAVENOUS
  Filled 2014-12-10 (×2): qty 200

## 2014-12-10 NOTE — Progress Notes (Addendum)
Occupational Therapy Treatment Patient Details Name: Eric [name] MRN: [id number] DOB: 1926-11-07 Today's Date: 12/10/2014    History of present illness s/p Exploratory laparotomy, sigmoid colectomy, descending colostomy on 11/28/14, DC to SNF 6/28, returned 7/1 with dehiscence of wound.   OT comments  Pt making progress in OT; did have one LOB in bathroom--cued for walker safety.    Follow Up Recommendations  SNF vs initial 24/7 supervision   Equipment Recommendations  None recommended by OT    Recommendations for Other Services      Precautions / Restrictions Precautions Precaution Comments: recent ABD surgery and L colostomy Restrictions Weight Bearing Restrictions: No       Mobility Bed Mobility               General bed mobility comments: OOB  Transfers   Equipment used: Rolling walker (2 wheeled) Transfers: Sit to/from Stand Sit to Stand: Modified independent (Device/Increase time)              Balance                                   ADL       Grooming: Wash/dry face;Oral care;Supervision/safety;Standing                                 General ADL Comments: used urinal sitting EOB.  Cues for safety with RW when getting to sink.  Pt did sidestep but then he "parked walker" next to him.  Had one LOB in bathroom.  Pt wanted to walk in hallway afterwards, stating that walking was helping his pain.  Min guard for safety, no LOB when ambulating in hall.  Rest breaks encouraged for energy conservation.  Pt uses 02 at night; dyspnea 2/4 with activity       Vision                     Perception     Praxis      Cognition   Behavior During Therapy: [facility] for tasks assessed/performed;Anxious Overall Cognitive Status: Within Functional Limits for tasks assessed                       Extremity/Trunk Assessment               Exercises     Shoulder Instructions       General Comments       Pertinent Vitals/ Pain       Pain Score: 6  Pain Location: abd Pain Descriptors / Indicators: Constant Pain Intervention(s): Limited activity within patient's tolerance;Monitored during session;Premedicated before session;Repositioned  Home Living                                          Prior Functioning/Environment              Frequency Min 2X/week     Progress Toward Goals  OT Goals(current goals can now be found in the care plan section)  Progress towards OT goals: Progressing toward goals  Acute Rehab OT Goals Patient Stated Goal: to go home  Plan      Co-evaluation      End of Session   Activity Tolerance Patient tolerated  treatment well   Patient Left in chair;with call bell/phone within reach;with nursing/sitter in room   Nurse Communication          Time: 1833-5825 OT Time Calculation (min): 24 min  Charges: OT General Charges $OT Visit: 1 Procedure OT Treatments $Self Care/Home Management : 8-22 mins $Therapeutic Activity: 8-22 mins  Zaide Kardell 12/10/2014, 11:53 AM Lesle Chris, OTR/L [phone] 12/10/2014

## 2014-12-10 NOTE — Progress Notes (Signed)
ANTIBIOTIC CONSULT NOTE - Follow Up  Pharmacy Consult for Vancomycin  Indication: pneumonia  Allergies  Allergen Reactions  . Actos [Pioglitazone Hydrochloride] Other (See Comments)    "felt funny, drowsy, and weak":  . Celebrex [Celecoxib] Other (See Comments)    "felt funny"  . Demerol Palpitations and Other (See Comments)    Increased BP  . Morphine And Related Nausea And Vomiting  . Ciprofloxacin Other (See Comments)    arthralgia  . Metformin Nausea And Vomiting  . Zocor [Simvastatin] Other (See Comments)    Makes pt very drowsy    Patient Measurements: Height: 5\' 6"  (167.6 cm) Weight: 205 lb 14.6 oz (93.4 kg) IBW/kg (Calculated) : 63.8  Vital Signs: Temp: 98.6 F (37 C) (07/03 2114) Temp Source: Oral (07/03 2114) BP: 95/49 mmHg (07/03 2114) Pulse Rate: 85 (07/03 2114) Intake/Output from previous day: 07/03 0701 - 07/04 0700 In: 120 [P.O.:120] Out: -  Intake/Output from this shift:    Labs:  Recent Labs  12/07/14 1732 12/08/14 0350 12/09/14 0927  WBC 17.3* 12.0* 12.8*  HGB 10.8* 10.7* 10.5*  PLT 267 276 280  CREATININE 1.01 0.88 1.17   Estimated Creatinine Clearance: 49.4 mL/min (by C-G formula based on Cr of 1.17).  Recent Labs  12/09/14 2253  [facility] 11     Microbiology: Recent Results (from the past 720 hour(s))  Surgical pcr screen     Status: None   Collection Time: 11/28/14 10:00 AM  Result Value Ref Range Status   MRSA, PCR NEGATIVE NEGATIVE Final   Staphylococcus aureus NEGATIVE NEGATIVE Final    Comment:        The Xpert SA Assay (FDA approved for NASAL specimens in patients over 19 years of age), is one component of a comprehensive surveillance program.  Test performance has been validated by [facility] for patients greater than or equal to 22 year old. It is not intended to diagnose infection nor to guide or monitor treatment.   Urine culture     Status: None   Collection Time: 12/07/14  8:06 PM  Result Value Ref  Range Status   Specimen Description URINE, RANDOM  Final   Special Requests NONE  Final   Culture   Final    7,000 COLONIES/mL INSIGNIFICANT GROWTH Performed at [facility]    Report Status 12/09/2014 FINAL  Final  Culture, blood (routine x 2)     Status: None (Preliminary result)   Collection Time: 12/07/14  8:55 PM  Result Value Ref Range Status   Specimen Description BLOOD RAC  Final   Special Requests BOTTLES DRAWN AEROBIC AND ANAEROBIC 5CC  Final   Culture   Final    NO GROWTH 2 DAYS Performed at [facility]    Report Status PENDING  Incomplete  Culture, blood (routine x 2)     Status: None (Preliminary result)   Collection Time: 12/07/14  9:05 PM  Result Value Ref Range Status   Specimen Description BLOOD RFARM  Final   Special Requests BOTTLES DRAWN AEROBIC ONLY 3CC  Final   Culture   Final    NO GROWTH 2 DAYS Performed at [facility]    Report Status PENDING  Incomplete  MRSA PCR Screening     Status: None   Collection Time: 12/08/14 12:25 AM  Result Value Ref Range Status   MRSA by PCR NEGATIVE NEGATIVE Final    Comment:        The GeneXpert MRSA Assay (FDA approved for  NASAL specimens only), is one component of a comprehensive MRSA colonization surveillance program. It is not intended to diagnose MRSA infection nor to guide or monitor treatment for MRSA infections.   Culture, sputum-assessment     Status: None   Collection Time: 12/08/14  2:09 AM  Result Value Ref Range Status   Specimen Description SPU  Final   Special Requests NONE  Final   Sputum evaluation   Final    THIS SPECIMEN IS ACCEPTABLE. RESPIRATORY CULTURE REPORT TO FOLLOW.   Report Status 12/08/2014 FINAL  Final  Culture, respiratory (NON-Expectorated)     Status: None (Preliminary result)   Collection Time: 12/08/14  2:09 AM  Result Value Ref Range Status   Specimen Description SPU  Final   Special Requests NONE  Final   Gram Stain   Final    NO WBC SEEN RARE  SQUAMOUS EPITHELIAL CELLS PRESENT MODERATE GRAM NEGATIVE RODS Performed at [facility]    Culture   Final    ABUNDANT GRAM NEGATIVE RODS Performed at [facility]    Report Status PENDING  Incomplete    Medical History: Past Medical History  Diagnosis Date  . LACTOSE INTOLERANCE   . OBESITY   . CARDIOMYOPATHY, ISCHEMIC   . AORTIC SCLEROSIS   . SICK SINUS/ TACHY-BRADY SYNDROME 09/2007    s/p PPM st judes  . PERIPHERAL VASCULAR DISEASE   . CAROTID BRUIT, RIGHT 02/27/2008  . IBS (irritable bowel syndrome)   . ALLERGIC RHINITIS   . ANEMIA-NOS   . OA (osteoarthritis)   . COPD   . GERD   . HYPERLIPIDEMIA   . HIATAL HERNIA   . Diverticulosis   . Prostate cancer     seed implants 2004  . DIABETES MELLITUS-TYPE II     diet controlled  . CORONARY ARTERY DISEASE     CABG 1995, PTCA/DES 2008, 2009 and 08/2010  . HYPERTENSION   . Asthma   . Sleep apnea   . Partial small bowel obstruction   . Primary hyperparathyroidism     Lab Results Component Value Date  PTH 150.7* 02/13/2013  CALCIUM 11.0* 02/13/2013  CAION 1.21 03/15/2008    . SMALL BOWEL OBSTRUCTION 04/18/2009    Qualifier: History of  By: Asa Lente MD, Jannifer Rodney Cataract     surgery  . Symptomatic diverticulosis 01/18/2009    Qualifier: Diagnosis of  By: Trellis Paganini PA-c, Amy S   . Hx of echocardiogram     Echo (9/15):  Mild LVH, EF 50-55%, no RWMA, Gr 1 DD, MAC, mild LAE.  . Diastolic dysfunction, Grade 1 11/24/2014  . Diverticulitis of colon with perforation 11/22/2014  . Hyponatremia 11/22/2014    Medications:  Scheduled:  . antiseptic oral rinse  7 mL Mouth Rinse q12n4p  . aspirin EC  81 mg Oral Daily  . chlorhexidine  15 mL Mouth Rinse BID  . chlorthalidone  25 mg Oral Daily  . enoxaparin (LOVENOX) injection  40 mg Subcutaneous Q24H  . feeding supplement (RESOURCE BREEZE)  1 Container Oral BID BM  . fluticasone  2 spray Each Nare Daily  . insulin aspart  0-15 Units Subcutaneous TID WC  . insulin  aspart  0-5 Units Subcutaneous QHS  . irbesartan  150 mg Oral Daily  . isosorbide mononitrate  30 mg Oral Daily  . piperacillin-tazobactam (ZOSYN)  IV  3.375 g Intravenous Q8H  . pravastatin  60 mg Oral Daily  . vancomycin  1,000 mg Intravenous Q12H  Infusions:   Assessment:  81 yr male with significant PMH, including recent hospitalization for diverticulitis with colon perforation undergoing sigmoid colectomy & descending colostomy. Pt has an open abdominal wound.  Presents to ED with abdominal wound pain.  CT shows possible early pneumonia  Pharmacy consulted to dose Vancomycin for treatment of suspected pneumonia  MD has ordered Zosyn, with request for pharmacy to adjust dosage of antibiotics as needed based on renal function  7/2 >>Vanc >> 7/2 >>Zosyn >>    7/2 blood: NGTD 7/2 urine: NG (Final) 7/2 sputum: Abundant GNR  Levels:  7/3 @ 22:53 Vanc Trough = 11 mcg/ml on 750mg  IV q12h  Goal of Therapy:  Vancomycin trough level 15-20 mcg/ml  Plan:  Measure antibiotic drug levels at steady state Follow up culture results  Continue Zosyn as ordered by MD Change Vancomycin to 1000mg  IV q12h  Everette Rank, PharmD 12/10/2014,12:09 AM

## 2014-12-10 NOTE — Assessment & Plan Note (Signed)
-   Suspect musculoskeletal from prolonged bedrest - Pt has known hx of chronic back pain - lumbar xray is notable for significant degenerative changes. - Continue with analgesics and PT as tolerated

## 2014-12-10 NOTE — Assessment & Plan Note (Signed)
resume statin therapy

## 2014-12-10 NOTE — Progress Notes (Signed)
PROGRESS NOTE  Eric [name] ZOX:[id number] DOB: 05-Apr-1927 DOA: 12/07/2014 PCP: [name], MD  HPI/Recap of past 24 hours: Patient is a 81 year old male with past medical history of ischemic cardiomyopathy, diastolic heart failure and diabetes mellitus who just finished a hospitalization for colonic perforation from diverticulitis leading to sigmoid colectomy and Hartman's pouch with abdominal wound left open to heal and transferred to skilled nursing facility on 6/29.  According to patient, wound care not being done consistently at nursing facility. Patient was brought in on evening of 7/14 increased abdominal pain plus drainage. In the emergency room, white blood cell count elevated at 17. CT scan noted stable wound, but questionable healthcare associated pneumonia. Patient had admitted to increased cough over the past week. Patient evaluate by general surgery who felt that wound was at its line and continued dressing changes recommended. Patient with hospital service   By the following day, patient had improved significantly. He was transferred to the floor from stepdown. Patient seen by wound care-abdominal wound not yet ready for wound VAC  Abdominal pain is controlled. Feels like his breathing is easier.   White blood cell count trending up with no fever noted.  Assessment/Plan: Present on Admission:  . Essential hypertension: Blood pressure stable.  . Coronary atherosclerosis . Leukocytosis: Secondary to pneumonia. Little improvement from yesterday, continue to monitor. No fevers    chronic diastolic heart failure: Currently euvolemic. Watching volumes very closely. We'll check BNP  . HCAP (healthcare-associated pneumonia): Principal problem. Continue antibiotics. Suspect cause is poor inspiratory effort. Patient started on incentive spirometry, did well.  Follow-up chest x-ray notes minimal infiltrate. Stop antibiotics after 7/5 dose.   . Obesity (BMI 30-39.9): Patient lives  criteria with BMI greater than 30  Perforated sigmoid colon status post Hartman's pouch and sigmoid colectomy with open wound: Surgery following. Wound itself observed and looks to be healing well. Eventual wound VAC, although not ready yet as per wound care  Diabetes mellitus: Sliding scale. Sugars well controlled, below 150   Code Status: Full code  Family Communication: Left message with daughter  Disposition Plan: Seen by PT recommending short-term skilled nursing versus home with 24-hour care.  Patient wants very much to go home with home health. He is rather insistent on this. Potential discharge in next 24-48 hours   Consultants:  General surgery  Procedures:  None  Antibiotics:  IV Zosyn 7/1-present  IV vancomycin 7/1-present   Objective: BP 115/45 mmHg  Pulse 73  Temp(Src) 98.4 F (36.9 C) (Oral)  Resp 16  Ht 5\' 6"  (1.676 m)  Wt 93.305 kg (205 lb 11.2 oz)  BMI 33.22 kg/m2  SpO2 92%  Intake/Output Summary (Last 24 hours) at 12/10/14 1314 Last data filed at 12/10/14 0730  Gross per 24 hour  Intake      0 ml  Output    275 ml  Net   -275 ml   Filed Weights   12/08/14 0013 12/10/14 0725  Weight: 93.4 kg (205 lb 14.6 oz) 93.305 kg (205 lb 11.2 oz)    Exam:   General:  Alert and oriented 3, no acute distress  Cardiovascular: Regular rate and rhythm, S1-S2  Respiratory: Mostly clear to auscultation bilaterally  Abdomen: Noted ostomy pouch, wound covered   Musculoskeletal: No clubbing or cyanosis, trace edema   Data Reviewed: Basic Metabolic Panel:  Recent Labs Lab 12/07/14 1732 12/08/14 0350 12/09/14 0927 12/10/14 0530  NA 135 136 133* 134*  K 4.1 4.0 3.5 3.8  CL  99* 99* 97* 99*  CO2 28 29 28 27   GLUCOSE 127* 117* 173* 121*  BUN 11 10 15  21*  CREATININE 1.01 0.88 1.17 1.21  CALCIUM 10.3 10.3 10.3 10.3   Liver Function Tests:  Recent Labs Lab 12/08/14 0350  AST 18  ALT 18  ALKPHOS 62  BILITOT 0.7  PROT 6.3*  ALBUMIN 3.1*     No results for input(s): LIPASE, AMYLASE in the last 168 hours. No results for input(s): AMMONIA in the last 168 hours. CBC:  Recent Labs Lab 12/07/14 1732 12/08/14 0350 12/09/14 0927 12/10/14 0530  WBC 17.3* 12.0* 12.8* 13.0*  NEUTROABS 14.5* 9.5*  --   --   HGB 10.8* 10.7* 10.5* 10.1*  HCT 33.5* 33.4* 32.8* 31.7*  MCV 98.2 98.8 97.3 98.4  PLT 267 276 280 331   Cardiac Enzymes:   No results for input(s): CKTOTAL, CKMB, CKMBINDEX, TROPONINI in the last 168 hours. BNP (last 3 results) No results for input(s): BNP in the last 8760 hours.  ProBNP (last 3 results)  Recent Labs  02/23/14 1232  PROBNP 35.0    CBG:  Recent Labs Lab 12/09/14 1147 12/09/14 1707 12/09/14 2129 12/10/14 0725 12/10/14 1210  GLUCAP 141* 114* 125* 122* 135*    Recent Results (from the past 240 hour(s))  Urine culture     Status: None   Collection Time: 12/07/14  8:06 PM  Result Value Ref Range Status   Specimen Description URINE, RANDOM  Final   Special Requests NONE  Final   Culture   Final    7,000 COLONIES/mL INSIGNIFICANT GROWTH Performed at [facility]    Report Status 12/09/2014 FINAL  Final  Culture, blood (routine x 2)     Status: None (Preliminary result)   Collection Time: 12/07/14  8:55 PM  Result Value Ref Range Status   Specimen Description BLOOD RAC  Final   Special Requests BOTTLES DRAWN AEROBIC AND ANAEROBIC 5CC  Final   Culture   Final    NO GROWTH 2 DAYS Performed at [facility]    Report Status PENDING  Incomplete  Culture, blood (routine x 2)     Status: None (Preliminary result)   Collection Time: 12/07/14  9:05 PM  Result Value Ref Range Status   Specimen Description BLOOD RFARM  Final   Special Requests BOTTLES DRAWN AEROBIC ONLY 3CC  Final   Culture   Final    NO GROWTH 2 DAYS Performed at [facility]    Report Status PENDING  Incomplete  MRSA PCR Screening     Status: None   Collection Time: 12/08/14 12:25 AM  Result  Value Ref Range Status   MRSA by PCR NEGATIVE NEGATIVE Final    Comment:        The GeneXpert MRSA Assay (FDA approved for NASAL specimens only), is one component of a comprehensive MRSA colonization surveillance program. It is not intended to diagnose MRSA infection nor to guide or monitor treatment for MRSA infections.   Culture, sputum-assessment     Status: None   Collection Time: 12/08/14  2:09 AM  Result Value Ref Range Status   Specimen Description SPU  Final   Special Requests NONE  Final   Sputum evaluation   Final    THIS SPECIMEN IS ACCEPTABLE. RESPIRATORY CULTURE REPORT TO FOLLOW.   Report Status 12/08/2014 FINAL  Final  Culture, respiratory (NON-Expectorated)     Status: None   Collection Time: 12/08/14  2:09 AM  Result  Value Ref Range Status   Specimen Description SPU  Final   Special Requests NONE  Final   Gram Stain   Final    NO WBC SEEN RARE SQUAMOUS EPITHELIAL CELLS PRESENT MODERATE GRAM NEGATIVE RODS Performed at [facility]    Culture   Final    ABUNDANT PSEUDOMONAS AERUGINOSA Performed at [facility]    Report Status 12/10/2014 FINAL  Final   Organism ID, Bacteria PSEUDOMONAS AERUGINOSA  Final      Susceptibility   Pseudomonas aeruginosa - MIC*    CEFEPIME 2 SENSITIVE Sensitive     CEFTAZIDIME 2 SENSITIVE Sensitive     CIPROFLOXACIN <=0.25 SENSITIVE Sensitive     GENTAMICIN 4 SENSITIVE Sensitive     IMIPENEM 2 SENSITIVE Sensitive     PIP/TAZO 8 SENSITIVE Sensitive     TOBRAMYCIN <=1 SENSITIVE Sensitive     * ABUNDANT PSEUDOMONAS AERUGINOSA     Studies: Dg Chest 2 View  12/10/2014   CLINICAL DATA:  Six week history of intermittent chest pain and shortness of breath. Prior CABG.  EXAM: CHEST  2 VIEW  COMPARISON:  12/07/2014 and earlier.  FINDINGS: Prior sternotomy for CABG. Cardiac silhouette mildly enlarged, unchanged. Thoracic aorta mildly tortuous and atherosclerotic, unchanged. Hilar and mediastinal contours otherwise  unremarkable. Chronic linear scarring involving the lung bases, unchanged. Mild pulmonary venous hypertension without overt edema, unchanged. No new pulmonary parenchymal abnormalities. No pleural effusions. Degenerative changes involving the thoracic spine. No significant interval change dating back to September, 2015.  IMPRESSION: Mild cardiomegaly. Chronic mild pulmonary venous hypertension without overt edema. Scarring in the lower lobes. No acute cardiopulmonary disease. Stable examination.   Electronically Signed   By: Evangeline Dakin M.D.   On: 12/10/2014 12:21    Scheduled Meds: . antiseptic oral rinse  7 mL Mouth Rinse q12n4p  . aspirin EC  81 mg Oral Daily  . chlorhexidine  15 mL Mouth Rinse BID  . chlorthalidone  25 mg Oral Daily  . enoxaparin (LOVENOX) injection  40 mg Subcutaneous Q24H  . feeding supplement (RESOURCE BREEZE)  1 Container Oral BID BM  . fluticasone  2 spray Each Nare Daily  . insulin aspart  0-15 Units Subcutaneous TID WC  . insulin aspart  0-5 Units Subcutaneous QHS  . irbesartan  150 mg Oral Daily  . isosorbide mononitrate  30 mg Oral Daily  . piperacillin-tazobactam (ZOSYN)  IV  3.375 g Intravenous Q8H  . pravastatin  60 mg Oral Daily  . vancomycin  1,000 mg Intravenous Q12H    Continuous Infusions:    Time spent: 15 min  [facility] Pager [phone]. If 7PM-7AM, please contact night-coverage at [email or web address], password [facility] 12/10/2014, 1:14 PM  LOS: 3 days

## 2014-12-10 NOTE — Progress Notes (Signed)
  Subjective: No co. Just has more questions about diverticular dis - which i answered. Family at bs. Still with some incisional pain. Pain meds work but make sleepy. States hydrocodone works well doesn't make sleepy  Objective: Vital signs in last 24 hours: Temp:  [97.8 F (36.6 C)-98.6 F (37 C)] 98.4 F (36.9 C) (07/04 0553) Pulse Rate:  [73-92] 73 (07/04 0553) Resp:  [16-18] 16 (07/04 0553) BP: (95-115)/(44-55) 115/45 mmHg (07/04 0553) SpO2:  [92 %-100 %] 92 % (07/04 0553) Weight:  [93.305 kg (205 lb 11.2 oz)] 93.305 kg (205 lb 11.2 oz) (07/04 0725) Last BM Date: 12/10/14 (ostomy intact)  Intake/Output from previous day: 07/03 0701 - 07/04 0700 In: 120 [P.O.:120] Out: -  Intake/Output this shift: Total I/O In: -  Out: 275 [Urine:275]  Upper airway cough/congestion Soft. Obese, mild tenderness. Stool in ostomy bag  Lab Results:   Recent Labs  12/09/14 0927 12/10/14 0530  WBC 12.8* 13.0*  HGB 10.5* 10.1*  HCT 32.8* 31.7*  PLT 280 331   BMET  Recent Labs  12/09/14 0927 12/10/14 0530  NA 133* 134*  K 3.5 3.8  CL 97* 99*  CO2 28 27  GLUCOSE 173* 121*  BUN 15 21*  CREATININE 1.17 1.21  CALCIUM 10.3 10.3   PT/INR No results for input(s): LABPROT, INR in the last 72 hours. ABG No results for input(s): PHART, HCO3 in the last 72 hours.  Invalid input(s): PCO2, PO2  Studies/Results: No results found.  Anti-infectives: Anti-infectives    Start     Dose/Rate Route Frequency Ordered Stop   12/10/14 0100  vancomycin (VANCOCIN) IVPB 1000 mg/200 mL premix     1,000 mg 200 mL/hr over 60 Minutes Intravenous Every 12 hours 12/10/14 0009     12/08/14 0100  piperacillin-tazobactam (ZOSYN) IVPB 3.375 g     3.375 g 12.5 mL/hr over 240 Minutes Intravenous Every 8 hours 12/08/14 0012     12/08/14 0100  vancomycin (VANCOCIN) IVPB 750 mg/150 ml premix  Status:  Discontinued     750 mg 150 mL/hr over 60 Minutes Intravenous Every 12 hours 12/08/14 0031 12/10/14  0008      Assessment/Plan: Principal Problem:  HCAP (healthcare-associated pneumonia) Active Problems:  Type 2 diabetes mellitus, controlled  Essential hypertension  Coronary atherosclerosis  Leukocytosis  Diastolic dysfunction, Grade 1  Obesity (BMI 30-39.9)  Hartmann's pouch of intestine  Agree with wound care -that wound not ready for wound vac and apparently pt did not like it, cont w-d dressing bid  Cont to encourage PO, shakes as tolerated Pt/ot abx for PNA   Leighton Ruff. Redmond Pulling, MD, FACS General, Bariatric, & Minimally Invasive Surgery [facility] Surgery, [place]   LOS: 3 days    Gayland Curry 12/10/2014

## 2014-12-10 NOTE — Assessment & Plan Note (Signed)
Remained stable - Patient to resume home BP meds on discharge

## 2014-12-10 NOTE — Assessment & Plan Note (Signed)
-   Mild, remained stable thus far

## 2014-12-10 NOTE — Assessment & Plan Note (Signed)
A1c about 7 months ago was 6.9 indicating good glycemic control. - Continued sliding scale insulin while inpatient

## 2014-12-10 NOTE — Assessment & Plan Note (Signed)
Has been on Zosyn, antibiotics were changed to Invanz by surgery 11/26/14. - Repeat CT scan 11/26/14 showed worsening free air - Pt had been refusing surgery early during this admission, however ultimately agreed with and underwent exlap with sigmoid colectomy and descending colostomy on 6/22 after symptoms continued to worsen - Pt remained remained with generalized post-operative pain that is gradually improving - Ileus resolved. - Patient was able to advance to soft per General Surgery - Patient's antibiotc was transitioned to augmentin on 6/28 to complete course after finishing 6/29 dose

## 2014-12-10 NOTE — Consult Note (Signed)
WOC ostomy follow up Stoma type/location: LLQ Colostomy Stomal assessment/size: 1 and 1/2 inch x 1 and 3/4 inch oval, red, most and flush from 12 o'clock to 5 o'clock, retracted from 5 o'clock to 12 o'clock with mucocutaneous separation. Peristomal assessment: intact. Pouching system has only been in place for 48 hours.  Will recommend changes every M-W-F. Treatment options for stomal/peristomal skin: Convex pouching system with skin barrier ring Output: soft brown stool Ostomy pouching: 1pc.convex pouch with skin barrier ring Education provided: Patient shown how to trace pattern and cut out pouch, hands are quite shaky and he may not be able to do this in the end.  Medial edge of tape is trimmed to accommodate his midline wound.  He is shown again how to stretch out the skin barrier ring and place it on the back of the pouch.  I assist him by guiding his hands in pouch placement.  He is thinking about using a mirror for pouch placement. We will change pouch again on Wednesday morning if he is still here.  He is to empty pouch for practice in the meantime. Supplies are at bedside for discharge. Enrolled patient in [place] Discharge program: Yes, from last admission.  [facility] nursing team will follow, and will remain available to this patient, the nursing, surgery and medical teams.   Thanks, Maudie Flakes, MSN, RN, [place], [place], [place] [phone])

## 2014-12-10 NOTE — Clinical Social Work Note (Signed)
Clinical Social Work Assessment  Patient Details  Name: Eric [name] MRN: [id number] Date of Birth: 08/04/1926  Date of referral:  12/10/14               Reason for consult:  Discharge Planning                Permission sought to share information with:  Family Supports Permission granted to share information::  Yes, Verbal Permission Granted  Name::     Toni  Agency::     Relationship::  dtr  Contact Information:     Housing/Transportation Living arrangements for the past 2 months:  Apartment Source of Information:  Patient, Adult Children Patient Interpreter Needed:  None Criminal Activity/Legal Involvement Pertinent to Current Situation/Hospitalization:  No - Comment as needed Significant Relationships:  Adult Children Lives with:  Self Do you feel safe going back to the place where you live?  Yes Need for family participation in patient care:  Yes (Comment)  Care giving concerns:  Patient and family report enough support for patient to return home with no safety concerns.   Social Worker assessment / plan:  CSW received referral due to patient being admitted from a facility. CSW reviewed chart and met with patient and dtrs at bedside. Patient agreeable for family involvement. Patient was recently admitted and DC to [facility]. Patient reports it was a negative experience and he does not want to return. Family in room reports that patient needed SNF at last DC but he is strong and independent enough to DC home now. Patient has had HH in the past and reports that neighbors have offered to assist as needed. Patient aware that he could go to a different SNF if desired but patient wants to return home.  CM consult was previously placed in EPIC for HH needs. CSW is signing off but available if further needs arise.  Employment status:  Retired Insurance information:  Medicare PT Recommendations:  Home with Home Health Information / Referral to community resources:  Skilled  Nursing Facility  Patient/Family's Response to care:  Family involved and reports they have already made arrangements for DC. Patient is anxious about HH being arranged prior to DC. CSW reassured patient that all HH could be ordered and arranged prior to DC.  Patient/Family's Understanding of and Emotional Response to Diagnosis, Current Treatment, and Prognosis:  Patient aware of wound care needs and reports he has been proactive in learning how to care for himself. Patient reports his ultimate goal is to be completely independent so he is aware that he needs to be engaged. Patient reports he is very active in rehab and believes he will fully recover in a couple of months.  Emotional Assessment Appearance:  Appears stated age Attitude/Demeanor/Rapport:  Other Affect (typically observed):  Appropriate Orientation:  Oriented to Self, Oriented to Place, Oriented to  Time, Oriented to Situation Alcohol / Substance use:  Not Applicable Psych involvement (Current and /or in the community):  No (Comment)  Discharge Needs  Concerns to be addressed:  No discharge needs identified Readmission within the last 30 days:  Yes Current discharge risk:  Lives alone Barriers to Discharge:  No Barriers Identified   ,  Marie, LCSW 12/10/2014, 10:26 AM [phone] 

## 2014-12-10 NOTE — Assessment & Plan Note (Signed)
ASA to be resumed on d/c

## 2014-12-11 LAB — CBC
HEMATOCRIT: 30.7 % — AB (ref 39.0–52.0)
HEMOGLOBIN: 10 g/dL — AB (ref 13.0–17.0)
MCH: 32.1 pg (ref 26.0–34.0)
MCHC: 32.6 g/dL (ref 30.0–36.0)
MCV: 98.4 fL (ref 78.0–100.0)
Platelets: 343 10*3/uL (ref 150–400)
RBC: 3.12 MIL/uL — AB (ref 4.22–5.81)
RDW: 12.5 % (ref 11.5–15.5)
WBC: 11.5 10*3/uL — AB (ref 4.0–10.5)

## 2014-12-11 LAB — GLUCOSE, CAPILLARY
GLUCOSE-CAPILLARY: 122 mg/dL — AB (ref 65–99)
GLUCOSE-CAPILLARY: 104 mg/dL — AB (ref 65–99)
GLUCOSE-CAPILLARY: 117 mg/dL — AB (ref 65–99)
Glucose-Capillary: 153 mg/dL — ABNORMAL HIGH (ref 65–99)

## 2014-12-11 MED ORDER — LEVOFLOXACIN 750 MG PO TABS
750.0000 mg | ORAL_TABLET | ORAL | Status: DC
Start: 2014-12-12 — End: 2014-12-12
  Administered 2014-12-12: 750 mg via ORAL
  Filled 2014-12-11: qty 1

## 2014-12-11 MED ORDER — GUAIFENESIN ER 600 MG PO TB12
600.0000 mg | ORAL_TABLET | Freq: Two times a day (BID) | ORAL | Status: DC
  Administered 2014-12-11 – 2014-12-12 (×2): 600 mg via ORAL
  Filled 2014-12-11 (×3): qty 1

## 2014-12-11 NOTE — Progress Notes (Signed)
Patient ID: Eric [name], male   DOB: July 13, 1926, 81 y.o.   MRN: [id number]     [facility]      [address] [place], [place] [address]    Phone: [phone] FAX: [phone]     Subjective: Doesn't want to go to SNF.  VSS.  Afebrile.  WBC going down .tolerating po.  Has pain, controlled with po pain meds.   Objective:  Vital signs:  Filed Vitals:   12/10/14 1351 12/10/14 2129 12/11/14 0512 12/11/14 1011  BP: 108/48 106/48 107/56 102/47  Pulse: 87 79 82 75  Temp: 98.7 F (37.1 C) 98.4 F (36.9 C) 98.1 F (36.7 C) 98.2 F (36.8 C)  TempSrc: Oral Oral Oral Oral  Resp: _0 Height:      Weight:      SpO2: 94% 94% 92% 94%    Last BM Date: 12/10/14  Intake/Output   Yesterday:  07/04 0701 - 07/05 0700 In: 360 [P.O.:360] Out: 1275 [Urine:1275] This shift:  Total I/O In: 240 [P.O.:240] Out: -    Physical Exam: General: Pt awake/alert/oriented x4 in no acute distress  Abdomen: +bs, abdomen is soft and non tender.  Wound is necrotic on the right side but base and left aspect of the wound is clean and the fascia is intact.  LLQ ostomy is functioning.    Problem List:   Principal Problem:   HCAP (healthcare-associated pneumonia) Active Problems:   Type 2 diabetes mellitus, controlled   Hypertensive heart disease with CHF   Coronary atherosclerosis   Leukocytosis   Chronic diastolic heart failure   Obesity (BMI 30-39.9)   Hartmann's pouch of intestine    Results:   Labs: Results for orders placed or performed during the hospital encounter of 12/07/14 (from the past 48 hour(s))  Glucose, capillary     Status: Abnormal   Collection Time: 12/09/14 11:47 AM  Result Value Ref Range   Glucose-Capillary 141 (H) 65 - 99 mg/dL   Comment 1 Notify RN   Glucose, capillary     Status: Abnormal   Collection Time: 12/09/14  5:07 PM  Result Value Ref Range   Glucose-Capillary 114 (H) 65 - 99 mg/dL   Comment  1 Notify RN   Glucose, capillary     Status: Abnormal   Collection Time: 12/09/14  9:29 PM  Result Value Ref Range   Glucose-Capillary 125 (H) 65 - 99 mg/dL  Vancomycin, trough     Status: None   Collection Time: 12/09/14 10:53 PM  Result Value Ref Range   Vancomycin Tr 11 10.0 - 20.0 ug/mL  CBC     Status: Abnormal   Collection Time: 12/10/14  5:30 AM  Result Value Ref Range   WBC 13.0 (H) 4.0 - 10.5 K/uL   RBC 3.22 (L) 4.22 - 5.81 MIL/uL   Hemoglobin 10.1 (L) 13.0 - 17.0 g/dL   HCT 31.7 (L) 39.0 - 52.0 %   MCV 98.4 78.0 - 100.0 fL   MCH 31.4 26.0 - 34.0 pg   MCHC 31.9 30.0 - 36.0 g/dL   RDW 12.4 11.5 - 15.5 %   Platelets 331 150 - 400 K/uL  Basic metabolic panel     Status: Abnormal   Collection Time: 12/10/14  5:30 AM  Result Value Ref Range   Sodium 134 (L) 135 - 145 mmol/L   Potassium 3.8 3.5 - 5.1 mmol/L   Chloride 99 (L) 101 - 111 mmol/L  CO2 27 22 - 32 mmol/L   Glucose, Bld 121 (H) 65 - 99 mg/dL   BUN 21 (H) 6 - 20 mg/dL   Creatinine, Ser 1.21 0.61 - 1.24 mg/dL   Calcium 10.3 8.9 - 10.3 mg/dL   GFR calc non Af Amer 53 (L) >60 mL/min   GFR calc Af Amer >60 >60 mL/min    Comment: (NOTE) The eGFR has been calculated using the CKD EPI equation. This calculation has not been validated in all clinical situations. eGFR's persistently <60 mL/min signify possible Chronic Kidney Disease.    Anion gap 8 5 - 15  Glucose, capillary     Status: Abnormal   Collection Time: 12/10/14  7:25 AM  Result Value Ref Range   Glucose-Capillary 122 (H) 65 - 99 mg/dL  Glucose, capillary     Status: Abnormal   Collection Time: 12/10/14 12:10 PM  Result Value Ref Range   Glucose-Capillary 135 (H) 65 - 99 mg/dL  Brain natriuretic peptide     Status: None   Collection Time: 12/10/14  2:45 PM  Result Value Ref Range   B Natriuretic Peptide 32.7 0.0 - 100.0 pg/mL  Glucose, capillary     Status: None   Collection Time: 12/10/14  5:06 PM  Result Value Ref Range   Glucose-Capillary 82 65  - 99 mg/dL  Glucose, capillary     Status: Abnormal   Collection Time: 12/10/14 10:00 PM  Result Value Ref Range   Glucose-Capillary 116 (H) 65 - 99 mg/dL  CBC     Status: Abnormal   Collection Time: 12/11/14  4:28 AM  Result Value Ref Range   WBC 11.5 (H) 4.0 - 10.5 K/uL   RBC 3.12 (L) 4.22 - 5.81 MIL/uL   Hemoglobin 10.0 (L) 13.0 - 17.0 g/dL   HCT 30.7 (L) 39.0 - 52.0 %   MCV 98.4 78.0 - 100.0 fL   MCH 32.1 26.0 - 34.0 pg   MCHC 32.6 30.0 - 36.0 g/dL   RDW 12.5 11.5 - 15.5 %   Platelets 343 150 - 400 K/uL  Glucose, capillary     Status: Abnormal   Collection Time: 12/11/14  7:40 AM  Result Value Ref Range   Glucose-Capillary 122 (H) 65 - 99 mg/dL   Comment 1 Notify RN     Imaging / Studies: Dg Chest 2 View  12/10/2014   CLINICAL DATA:  Six week history of intermittent chest pain and shortness of breath. Prior CABG.  EXAM: CHEST  2 VIEW  COMPARISON:  12/07/2014 and earlier.  FINDINGS: Prior sternotomy for CABG. Cardiac silhouette mildly enlarged, unchanged. Thoracic aorta mildly tortuous and atherosclerotic, unchanged. Hilar and mediastinal contours otherwise unremarkable. Chronic linear scarring involving the lung bases, unchanged. Mild pulmonary venous hypertension without overt edema, unchanged. No new pulmonary parenchymal abnormalities. No pleural effusions. Degenerative changes involving the thoracic spine. No significant interval change dating back to September, 2015.  IMPRESSION: Mild cardiomegaly. Chronic mild pulmonary venous hypertension without overt edema. Scarring in the lower lobes. No acute cardiopulmonary disease. Stable examination.   Electronically Signed   By: Evangeline Dakin M.D.   On: 12/10/2014 12:21    Medications / Allergies:  Scheduled Meds: . antiseptic oral rinse  7 mL Mouth Rinse q12n4p  . aspirin EC  81 mg Oral Daily  . chlorhexidine  15 mL Mouth Rinse BID  . chlorthalidone  25 mg Oral Daily  . enoxaparin (LOVENOX) injection  40 mg Subcutaneous Q24H   . feeding supplement (RESOURCE  BREEZE)  1 Container Oral BID BM  . fluticasone  2 spray Each Nare Daily  . insulin aspart  0-15 Units Subcutaneous TID WC  . insulin aspart  0-5 Units Subcutaneous QHS  . irbesartan  150 mg Oral Daily  . isosorbide mononitrate  30 mg Oral Daily  . piperacillin-tazobactam (ZOSYN)  IV  3.375 g Intravenous Q8H  . pravastatin  60 mg Oral Daily   Continuous Infusions:  PRN Meds:.albuterol, HYDROcodone-acetaminophen, oxyCODONE  Antibiotics: Anti-infectives    Start     Dose/Rate Route Frequency Ordered Stop   12/10/14 0100  vancomycin (VANCOCIN) IVPB 1000 mg/200 mL premix  Status:  Discontinued     1,000 mg 200 mL/hr over 60 Minutes Intravenous Every 12 hours 12/10/14 0009 12/10/14 1317   12/08/14 0100  piperacillin-tazobactam (ZOSYN) IVPB 3.375 g     3.375 g 12.5 mL/hr over 240 Minutes Intravenous Every 8 hours 12/08/14 0012     12/08/14 0100  vancomycin (VANCOCIN) IVPB 750 mg/150 ml premix  Status:  Discontinued     750 mg 150 mL/hr over 60 Minutes Intravenous Every 12 hours 12/08/14 0031 12/10/14 0008        Assessment/Plan S/P exploratory laparotomy, sigmoid Colectomy, descending colostomy for perforated diverticulitis---Dr. Brien Mates 11/28/14 Readmitted for HCAP 7/1 -Surgically stable -Continue with BID Wet to dry dressing changes, has necrosis right wound edge and VAC not appropriate -continue with ostomy teaching -mobilize, IS and pain control.  ID- zosyn for HCAP VTE prophylaxis-lovenox Dispo-working towards DC home with [facility] which sounds reasonable as long as HH and the patient can manage dressing changes and ostomy care.    Erby Pian, [facility] Surgery Pager [phone])   12/11/2014 10:35 AM

## 2014-12-11 NOTE — Care Management (Signed)
Important Message  Patient Details  Name: Eric [name] MRN: [id number] Date of Birth: Jun 05, 1927   Medicare Important Message Given:  Yes-second notification given    Camillo Flaming, LCSW 12/11/2014, 1:46 PM

## 2014-12-11 NOTE — Consult Note (Signed)
WOC ostomy follow up Stoma type/location: LLQ Colostomy Stomal assessment/size:  Peristomal assessment: Viewed through pouch Treatment options for stomal/peristomal skin: Patient is wearing a skin barrier ring and a convex pouch Output brown stool Ostomy pouching: 1pc.convex with skin barrier ring.  Education provided: Patient is emptying pouch independently and beginning to perform wound care to abdominal wound.  Has [facility] services ordered. Scheduled pouch change is tomorrow am and ui again perform with patient's assistance as he is able then.  Pouch changes are recommended at three times weekly (M-W-F) at this time. Enrolled patient in [place] Start Discharge program: Yes [facility] nursing team will follow, and will remain available to this patient, the nursing, surgical and medical teams.   Thanks, Maudie Flakes, MSN, RN, [place], [place], [place] [phone])

## 2014-12-11 NOTE — Discharge Summary (Signed)
Discharge Summary  Eric [name]:[id number] DOB: 02/09/1927  PCP: [name], MD  Admit date: 12/07/2014 Anticipated Discharge date: 12/12/2014  Time spent: 25 minutes  Recommendations for Outpatient Follow-up:  1. Patient will follow-up with Dr. Jill Alexanders, [facility] surgery in one week 2. Patient is going home with home health nurse, PT, OT, aide  3.  wound care: Change pouch for ostomy 3 times a week.  For midline wound: Twice-daily saline dressing changes  Discharge Diagnoses:  Active Hospital Problems   Diagnosis Date Noted  . HCAP (healthcare-associated pneumonia) 12/07/2014  . Obesity (BMI 30-39.9) 12/08/2014  . Hartmann's pouch of intestine 12/08/2014  . Chronic diastolic heart failure 93/71/6967  . Leukocytosis 11/22/2014  . Type 2 diabetes mellitus, controlled 01/18/2009  . Hypertensive heart disease with CHF 01/18/2009  . Coronary atherosclerosis 01/18/2009    Resolved Hospital Problems   Diagnosis Date Noted Date Resolved  No resolved problems to display.    Discharge Condition: Improved, being discharged home  Diet recommendation: carb modified heart healthy  Filed Weights   12/08/14 0013 12/10/14 0725  Weight: 93.4 kg (205 lb 14.6 oz) 93.305 kg (205 lb 11.2 oz)    History of present illness:  Patient is a 81 year old male with past medical history of ischemic cardiomyopathy, diastolic heart failure and diabetes mellitus who just finished a hospitalization for colonic perforation from diverticulitis leading to sigmoid colectomy and Hartman's pouch with abdominal wound left open to heal and transferred to skilled nursing facility on 6/29. According to patient, wound care not being done consistently at nursing facility. Patient was brought in on evening of 7/14 increased abdominal pain plus drainage. In the emergency room, white blood cell count elevated at 17. CT scan noted stable wound, but questionable healthcare associated pneumonia.  Patient had admitted to increased cough over the past week. Patient evaluate by general surgery who felt that wound was at its line and continued dressing changes recommended. Patient admitted to the hospitalist service  Hospital Course:  Principal Problem:   HCAP (healthcare-associated pneumonia): Principal problem. Suspected causes poor respiratory effort secondary to abdominal wound issues previously. No signs of aspiration. Patient started on incentive spirometry and did well. Follow-up chest x-ray done 7/4 noted minimal infiltrate. Patient continued to receive IV antibiotics which were changed over to by mouth Levaquin by 7/6. He will then have completed a seven-day course. Active Problems:   Type 2 diabetes mellitus, controlled: Patient is been on sliding scale. Sugars well controlled.    Coronary atherosclerosis   Leukocytosis: Secondary to pneumonia. Near resolution by 7/5.    Chronic diastolic heart failure: Currently euvolemic. Watching volumes of a closely. BNP normal.    Obesity (BMI 30-39.9): Patient needs criteria with BMI greater than 30.  Perforated sigmoid colon status post Hartman's pouch and sigmoid colectomy with open wound: Surgery following. Wound itself observed and looks to be healing well. Eventual wound VAC, although not ready yet as per wound care.  Wound care has been following patient recommending twice a day saline dressing changes as well as changing ostomy pouch 3 times a week. Patient is been with guidance and education trying to do this on his own. He is adamant about not going back to a skilled nursing facility and therefore, plan is for patient to go home with full home health services   Procedures:  None  Consultations:  General surgery  Discharge Exam: BP 109/49 mmHg  Pulse 87  Temp(Src) 98.2 F (36.8 C) (Oral)  Resp 18  Ht 5\' 6"  (1.676 m)  Wt 93.305 kg (205 lb 11.2 oz)  BMI 33.22 kg/m2  SpO2 97%  General: alert and oriented  3 Cardiovascular: regular rate and rhythm, G4-W1, 027 systolic ejection murmur Respiratory: few rhonchi, mostly clear  Discharge Instructions You were cared for by a hospitalist during your hospital stay. If you have any questions about your discharge medications or the care you received while you were in the hospital after you are discharged, you can call the unit and asked to speak with the hospitalist on call if the hospitalist that took care of you is not available. Once you are discharged, your primary care physician will handle any further medical issues. Please note that NO REFILLS for any discharge medications will be authorized once you are discharged, as it is imperative that you return to your primary care physician (or establish a relationship with a primary care physician if you do not have one) for your aftercare needs so that they can reassess your need for medications and monitor your lab values.     Medication List    STOP taking these medications        amoxicillin-clavulanate 875-125 MG per tablet  Commonly known as:  AUGMENTIN      TAKE these medications        aspirin EC 81 MG tablet  Take 1 tablet (81 mg total) by mouth daily.     chlorthalidone 25 MG tablet  Commonly known as:  HYGROTON  Take 25 mg by mouth daily.     feeding supplement (RESOURCE BREEZE) Liqd  Take 1 Container by mouth 2 (two) times daily between meals.     fluticasone 50 MCG/ACT nasal spray  Commonly known as:  FLONASE  Place 2 sprays into both nostrils daily. Allergies     HYDROcodone-acetaminophen 5-325 MG per tablet  Commonly known as:  NORCO/VICODIN  Take 1-2 tablets by mouth every 4 (four) hours as needed for moderate pain.     isosorbide mononitrate 30 MG 24 hr tablet  Commonly known as:  IMDUR  Take 1 tablet (30 mg total) by mouth every evening.     nitroGLYCERIN 0.4 MG SL tablet  Commonly known as:  NITROSTAT  Place 0.4 mg under the tongue every 5 (five) minutes as needed  for chest pain. Chest pain     Oxycodone HCl 10 MG Tabs  Take 10 mg by mouth every 6 (six) hours as needed (sever e pain).     oxycodone 5 MG capsule  Commonly known as:  OXY-IR  Take 5 mg by mouth every 6 (six) hours as needed for pain.     pravastatin 20 MG tablet  Commonly known as:  PRAVACHOL  Take 3 tablets (60 mg total) by mouth daily.     PROVENTIL HFA 108 (90 BASE) MCG/ACT inhaler  Generic drug:  albuterol  Inhale 2 puffs into the lungs every 4 (four) hours as needed for shortness of breath. Wheezing     albuterol (2.5 MG/3ML) 0.083% nebulizer solution  Commonly known as:  PROVENTIL  USE 1 VIAL WITH NEBULIZER EVERY 4 HOURS AS NEEDED FOR WHEEZING     SYSTANE 0.4-0.3 % Soln  Generic drug:  Polyethyl Glycol-Propyl Glycol  Apply 2 drops to eye 4 (four) times daily.     valsartan 160 MG tablet  Commonly known as:  DIOVAN  Take 1 tablet (160 mg total) by mouth daily.       Allergies  Allergen Reactions  . Actos [  Pioglitazone Hydrochloride] Other (See Comments)    "felt funny, drowsy, and weak":  . Celebrex [Celecoxib] Other (See Comments)    "felt funny"  . Demerol Palpitations and Other (See Comments)    Increased BP  . Morphine And Related Nausea And Vomiting  . Ciprofloxacin Other (See Comments)    arthralgia  . Metformin Nausea And Vomiting  . Zocor [Simvastatin] Other (See Comments)    Makes pt very drowsy      The results of significant diagnostics from this hospitalization (including imaging, microbiology, ancillary and laboratory) are listed below for reference.    Significant Diagnostic Studies: Dg Chest 2 View  12/10/2014   CLINICAL DATA:  Six week history of intermittent chest pain and shortness of breath. Prior CABG.  EXAM: CHEST  2 VIEW  COMPARISON:  12/07/2014 and earlier.  FINDINGS: Prior sternotomy for CABG. Cardiac silhouette mildly enlarged, unchanged. Thoracic aorta mildly tortuous and atherosclerotic, unchanged. Hilar and mediastinal contours  otherwise unremarkable. Chronic linear scarring involving the lung bases, unchanged. Mild pulmonary venous hypertension without overt edema, unchanged. No new pulmonary parenchymal abnormalities. No pleural effusions. Degenerative changes involving the thoracic spine. No significant interval change dating back to September, 2015.  IMPRESSION: Mild cardiomegaly. Chronic mild pulmonary venous hypertension without overt edema. Scarring in the lower lobes. No acute cardiopulmonary disease. Stable examination.   Electronically Signed   By: Evangeline Dakin M.D.   On: 12/10/2014 12:21   Dg Lumbar Spine 2-3 Views  12/01/2014   CLINICAL DATA:  Current back pain got worse after surgery a couple of days ago. Recent colon resection. Drainage on the left side on.  EXAM: LUMBAR SPINE - 2-3 VIEW  COMPARISON:  11/26/2014 CT  FINDINGS: There is significant degenerative change throughout the lumbar spine. No evidence for acute fracture or traumatic subluxation. There is atherosclerotic calcification of the abdominal aorta. Residual contrast in the bowel after recent CT exam. Patient has had previous right hip arthroplasty. Prostate seeds are noted.  IMPRESSION: 1. Significant degenerative changes. 2.  No evidence for acute  abnormality. 3. Abdominal aorta atherosclerosis.   Electronically Signed   By: Nolon Nations M.D.   On: 12/01/2014 17:10   Ct Abdomen Pelvis W Contrast  12/07/2014   CLINICAL DATA:  Large wound dehiscence at the mid abdomen, status post recent surgery. Worsening pain and swelling at the wound. Initial encounter.  EXAM: CT ABDOMEN AND PELVIS WITH CONTRAST  TECHNIQUE: Multidetector CT imaging of the abdomen and pelvis was performed using the standard protocol following bolus administration of intravenous contrast.  CONTRAST:  148mL OMNIPAQUE IOHEXOL 300 MG/ML  SOLN  COMPARISON:  CT of the abdomen and pelvis from 11/26/2014  FINDINGS: Mild right basilar opacity may reflect atelectasis or possibly mild  pneumonia. Diffuse coronary artery calcifications are seen. Pacemaker leads are partially imaged. Postoperative change is seen anterior to the gastroesophageal junction.  Scattered hepatic cysts are again seen, measuring up to 4.4 cm in size. The spleen is unremarkable in appearance. The gallbladder is within normal limits. The pancreas and adrenal glands are unremarkable.  Scattered small bilateral renal cysts measure up to 2.7 cm in size. There is no evidence of hydronephrosis. No renal or ureteral stones are seen. No perinephric stranding is appreciated.  A large wound dehiscence is noted along the anterior midline abdominal wall, with mild associated soft tissue inflammation and minimal adjacent soft tissue air. There is no evidence of abscess formation at this time. Minimal adjacent intraperitoneal soft tissue stranding is likely postoperative  in nature.  No free fluid is identified. The small bowel is unremarkable in appearance. The stomach is within normal limits. No acute vascular abnormalities are seen. Diffuse calcification is noted along the abdominal aorta and its branches, including at the proximal superior mesenteric artery and proximal renal arteries bilaterally.  The appendix is normal in caliber, without evidence of appendicitis. Residual contrast is seen within the colon, to the level of the patient's left lower quadrant colostomy. The colostomy is unremarkable in appearance.  The patient's Hartmann's pouch is unremarkable in appearance, with a small amount of residual contrast seen.  The bladder is mildly distended and grossly unremarkable. Scattered brachytherapy seeds are seen at the prostate bed. No inguinal lymphadenopathy is seen. A penile implant and reservoir are partially imaged.  No acute osseous abnormalities are identified. The patient is status post right hip arthroplasty. Multilevel vacuum phenomenon is noted along the lumbar spine.  IMPRESSION: 1. Large wound dehiscence along the  anterior midline abdominal wall, with mild associated soft tissue inflammation and minimal adjacent soft tissue air. No evidence of abscess formation at this time. Minimal adjacent intraperitoneal soft tissue stranding is likely postoperative in nature. 2. Mild right basilar airspace opacity may reflect atelectasis or possibly mild pneumonia. 3. Diffuse coronary artery calcifications seen. 4. Scattered hepatic and bilateral renal cysts seen. 5. Diffuse calcification along the abdominal aorta and its branches, including at the proximal superior mesenteric artery and proximal renal arteries bilaterally. 6. Left lower quadrant colostomy is unremarkable in appearance. 7. Minimal degenerative change along the lumbar spine.   Electronically Signed   By: Garald Balding M.D.   On: 12/07/2014 20:52   Ct Abdomen Pelvis W Contrast  11/26/2014   CLINICAL DATA:  81 year old with left lower quadrant pain for 2-3 months. Pain radiates to the back. Evaluate for diverticulitis. Patient complains of increased pain.  EXAM: CT ABDOMEN AND PELVIS WITH CONTRAST  TECHNIQUE: Multidetector CT imaging of the abdomen and pelvis was performed using the standard protocol following bolus administration of intravenous contrast.  CONTRAST:  100 mL Omnipaque 300  COMPARISON:  11/21/2014 and had 03/13/2014  FINDINGS: Chronic densities in the left lower lobe. There is a large amount of free air in the abdomen. In addition, there are innumerable gas bubbles throughout the abdominal fat. Again noted is a large gas filled structure involving the sigmoid colon. This structure now measures 8.3 x 7.2 x 5.3 cm and previously measured 7.4 x 8.3 x 4.4 cm. There are decreased inflammatory changes surrounding this gas-filled structure and sigmoid colon. There is oral contrast throughout the small and large bowel including the rectal region.  Again noted are multiple low-density structures throughout the liver which are most compatible with hepatic cysts,  largest in the left hepatic lobe measuring up to 4.3 cm. No acute abnormality to the gallbladder. The portal venous system is patent. Fat infiltration of the pancreas. Normal appearance of the spleen. Normal appearance of the adrenal glands. Stable nodule along the posterior right hemidiaphragm measures 1.8 x 1.0 cm and minimally changed since 2015. Stable low-density cyst in left kidney. Evidence for additional small renal cysts. No acute abnormality to the kidneys.  The abdominal aorta and visceral arteries are heavily calcified. No evidence for an aortic aneurysm.  Patient has penile implants with a fluid reservoir in the left hemipelvis. There prostate seed implants. Urinary bladder is decompressed. There is no significant free fluid or lymphadenopathy within the abdomen or pelvis.  Right hip replacement is located. No acute  bone abnormality. Multilevel degenerative disc disease in lumbar spine.  IMPRESSION: Large amount of free air throughout the abdomen compatible with a perforated viscus. The source of the bowel perforation is likely from the sigmoid colon and the large gas-filled collection. The large gas-filled collection involves the sigmoid colon and slightly increased in size. The inflammatory changes surrounding the sigmoid colon have decreased.  No focal fluid collection or free fluid.  Numerous hepatic and renal cysts.  Critical Value/emergent results were called by telephone at the time of interpretation on 11/26/2014 at 3:46 pm to Dr. Jacquelynn Cree , who verbally acknowledged these results.   Electronically Signed   By: Markus Daft M.D.   On: 11/26/2014 15:54   Ct Abdomen Pelvis W Contrast  11/21/2014   CLINICAL DATA:  81 year old male with 2-3 day history of lower abdominal pain, distention and constipation. Past history of diverticulitis and prostate cancer.  EXAM: CT ABDOMEN AND PELVIS WITH CONTRAST  TECHNIQUE: Multidetector CT imaging of the abdomen and pelvis was performed using the standard  protocol following bolus administration of intravenous contrast.  CONTRAST:  191mL OMNIPAQUE IOHEXOL 300 MG/ML  SOLN  COMPARISON:  Prior CT abdomen/ pelvis 03/13/2014  FINDINGS: Lower Chest: Dependent atelectasis in the lower lobes. The lungs are otherwise clear. Visualized cardiac structures within normal limits for size. No pericardial effusion. Sequelae of prior CABG and pacemaker placement. Unremarkable visualized distal thoracic esophagus.  Abdomen: Unremarkable CT appearance of the stomach, duodenum, spleen, adrenal glands. Diffuse fatty atrophy of the pancreas. No mass or inflammation. Multiple circumscribed low-attenuation lesions scattered throughout the liver the largest of which measures 4.4 cm and is water attenuation consistent with a simple cyst. Many of the other lesions are too small for accurate characterization but remain unchanged in size, number and configuration. These almost certainly represent additional small cysts or biliary hamartomas. It is stable to slightly decreased size of a soft tissue nodule posterior to the right liver along the peritoneal surface which measures 17 x 8 mm compared to 19 x 9 mm previously. While nonspecific in appearance, this abnormality is remains essentially unchanged dating back to 2011. Greater than 5 year stability is consistent with benignity. Gallbladder is unremarkable. No intra or extrahepatic biliary ductal dilatation.  Unremarkable appearance of the bilateral kidneys. No focal solid lesion, hydronephrosis or nephrolithiasis. 2.9 cm simple cyst in the left lower pole. Additional tiny low-attenuation lesions are too small for characterization but statistically likely also benign cysts.  Extensive sigmoid colonic diverticula. There is focal inflammation of the proximal sigmoid colon consistent with acute diverticulitis. A large air collection is present within the anterior colonic wall measuring 8.3 x 4.4 cm. This likely represents a contained micro  perforation. No fluid component to definitively suggest abscess.  Just inferior to the inflamed portion of colon lies the reservoir for the patient's penile prosthesis. This prosthesis may be extraperitoneal. No evidence of small bowel obstruction. Normal appendix in the right lower quadrant. No free fluid or suspicious adenopathy.  Pelvis: Left lower quadrant penile prosthesis. Multiple brachytherapy seeds project over the bladder. No suspicious adenopathy or free fluid.  Bones/Soft Tissues: No acute fracture or aggressive appearing lytic or blastic osseous lesion. Right hip arthroplasty prosthesis. Lower lumbar degenerative disc disease.  Vascular: Atherosclerotic vascular disease without significant stenosis or aneurysmal dilatation.  IMPRESSION: 1. Sigmoid colonic diverticulitis with large intramural air collection within the anterior wall of the affected sigmoid segment concerning for contained micro perforation. No fluid component to suggest a drainable abscess. 2.  Additional ancillary findings as above without significant interval change.   Electronically Signed   By: Jacqulynn Cadet M.D.   On: 11/21/2014 21:00   Dg Chest Port 1 View  12/07/2014   CLINICAL DATA:  81 year old male with recent surgery presenting with leukocytosis.  EXAM: PORTABLE CHEST - 1 VIEW  COMPARISON:  Chest x-ray dated 12/02/2014  FINDINGS: Single-view of the chest demonstrates mild emphysematous changes. Minimal bibasilar subsegmental atelectatic changes noted. No focal consolidation or pneumothorax. No pleural effusion. The cardiomediastinal silhouette is within normal limits. Median sternotomy wires, CABG clips, and the left pectoral pacemaker device noted. The osseous structures are grossly unremarkable.  IMPRESSION: No acute cardiopulmonary process.   Electronically Signed   By: Anner Crete M.D.   On: 12/07/2014 20:23   Dg Chest Port 1 View  12/02/2014   CLINICAL DATA:  Cardiomyopathy.  Sick sinus syndrome.  EXAM:  PORTABLE CHEST - 1 VIEW  COMPARISON:  11/28/2014  FINDINGS: There is a left chest wall pacer device. Previous median sternotomy and CABG procedure. Atelectasis is identified within the left base. No pleural effusion or edema.  IMPRESSION: 1. Left base atelectasis.   Electronically Signed   By: Kerby Moors M.D.   On: 12/02/2014 11:22   Dg Chest Port 1 View  11/28/2014   CLINICAL DATA:  Cough, shortness of breath for 2-3 days.  Pneumonia.  EXAM: PORTABLE CHEST - 1 VIEW  COMPARISON:  07/26/2014  FINDINGS: Left pacer remains in place, unchanged. Prior CABG. Bibasilar densities are stable compatible with scarring. Heart is normal size. No acute airspace opacities or effusions. No acute bony abnormality.  IMPRESSION: Bibasilar scarring.  No active disease.   Electronically Signed   By: Rolm Baptise M.D.   On: 11/28/2014 10:51    Microbiology: Recent Results (from the past 240 hour(s))  Urine culture     Status: None   Collection Time: 12/07/14  8:06 PM  Result Value Ref Range Status   Specimen Description URINE, RANDOM  Final   Special Requests NONE  Final   Culture   Final    7,000 COLONIES/mL INSIGNIFICANT GROWTH Performed at [facility]    Report Status 12/09/2014 FINAL  Final  Culture, blood (routine x 2)     Status: None (Preliminary result)   Collection Time: 12/07/14  8:55 PM  Result Value Ref Range Status   Specimen Description BLOOD RAC  Final   Special Requests BOTTLES DRAWN AEROBIC AND ANAEROBIC 5CC  Final   Culture   Final    NO GROWTH 4 DAYS Performed at [facility]    Report Status PENDING  Incomplete  Culture, blood (routine x 2)     Status: None (Preliminary result)   Collection Time: 12/07/14  9:05 PM  Result Value Ref Range Status   Specimen Description BLOOD RFARM  Final   Special Requests BOTTLES DRAWN AEROBIC ONLY 3CC  Final   Culture   Final    NO GROWTH 4 DAYS Performed at [facility]    Report Status PENDING  Incomplete  MRSA PCR  Screening     Status: None   Collection Time: 12/08/14 12:25 AM  Result Value Ref Range Status   MRSA by PCR NEGATIVE NEGATIVE Final    Comment:        The GeneXpert MRSA Assay (FDA approved for NASAL specimens only), is one component of a comprehensive MRSA colonization surveillance program. It is not intended to diagnose MRSA infection nor to guide or  monitor treatment for MRSA infections.   Culture, sputum-assessment     Status: None   Collection Time: 12/08/14  2:09 AM  Result Value Ref Range Status   Specimen Description SPU  Final   Special Requests NONE  Final   Sputum evaluation   Final    THIS SPECIMEN IS ACCEPTABLE. RESPIRATORY CULTURE REPORT TO FOLLOW.   Report Status 12/08/2014 FINAL  Final  Culture, respiratory (NON-Expectorated)     Status: None   Collection Time: 12/08/14  2:09 AM  Result Value Ref Range Status   Specimen Description SPU  Final   Special Requests NONE  Final   Gram Stain   Final    NO WBC SEEN RARE SQUAMOUS EPITHELIAL CELLS PRESENT MODERATE GRAM NEGATIVE RODS Performed at [facility]    Culture   Final    ABUNDANT PSEUDOMONAS AERUGINOSA Performed at [facility]    Report Status 12/10/2014 FINAL  Final   Organism ID, Bacteria PSEUDOMONAS AERUGINOSA  Final      Susceptibility   Pseudomonas aeruginosa - MIC*    CEFEPIME 2 SENSITIVE Sensitive     CEFTAZIDIME 2 SENSITIVE Sensitive     CIPROFLOXACIN <=0.25 SENSITIVE Sensitive     GENTAMICIN 4 SENSITIVE Sensitive     IMIPENEM 2 SENSITIVE Sensitive     PIP/TAZO 8 SENSITIVE Sensitive     TOBRAMYCIN <=1 SENSITIVE Sensitive     * ABUNDANT PSEUDOMONAS AERUGINOSA     Labs: Basic Metabolic Panel:  Recent Labs Lab 12/07/14 1732 12/08/14 0350 12/09/14 0927 12/10/14 0530  NA 135 136 133* 134*  [name] 4.1 4.0 3.5 3.8  CL 99* 99* 97* 99*  CO2 28 29 28 27   GLUCOSE 127* 117* 173* 121*  BUN 11 10 15  21*  CREATININE 1.01 0.88 1.17 1.21  CALCIUM 10.3 10.3 10.3 10.3   Liver  Function Tests:  Recent Labs Lab 12/08/14 0350  AST 18  ALT 18  ALKPHOS 62  BILITOT 0.7  PROT 6.3*  ALBUMIN 3.1*   No results for input(s): LIPASE, AMYLASE in the last 168 hours. No results for input(s): AMMONIA in the last 168 hours. CBC:  Recent Labs Lab 12/07/14 1732 12/08/14 0350 12/09/14 0927 12/10/14 0530 12/11/14 0428  WBC 17.3* 12.0* 12.8* 13.0* 11.5*  NEUTROABS 14.5* 9.5*  --   --   --   HGB 10.8* 10.7* 10.5* 10.1* 10.0*  HCT 33.5* 33.4* 32.8* 31.7* 30.7*  MCV 98.2 98.8 97.3 98.4 98.4  PLT 267 276 280 331 343   Cardiac Enzymes: No results for input(s): CKTOTAL, CKMB, CKMBINDEX, TROPONINI in the last 168 hours. BNP: BNP (last 3 results)  Recent Labs  12/10/14 1445  BNP 32.7    ProBNP (last 3 results)  Recent Labs  02/23/14 1232  PROBNP 35.0    CBG:  Recent Labs Lab 12/10/14 1210 12/10/14 1706 12/10/14 2200 12/11/14 0740 12/11/14 1129  GLUCAP 135* 82 116* 122* 153*       Signed:  KRISHNAN,Eric [name]  [facility] 12/11/2014, 3:49 PM

## 2014-12-11 NOTE — Progress Notes (Signed)
Occupational Therapy Treatment Patient Details Name: Eric [name] MRN: [id number] DOB: [birth year] Today's Date: 12/11/2014    History of present illness s/p Exploratory laparotomy, sigmoid colectomy, descending colostomy on 11/28/14, DC to SNF 6/28, returned 7/1 with dehiscence of wound.   OT comments  Pt tends to move quickly but no LOB when retrieving items.  Cued to move more slowly for safety.  Pt may leave tomorrow and states he doesn't have any concerns beyond getting pattern for ostomy bag--talked to RN and Gordon RN  Follow Up Recommendations  Home health OT    Equipment Recommendations  None recommended by OT    Recommendations for Other Services      Precautions / Restrictions Precautions Precaution Comments: recent ABD surgery and L colostomy Restrictions Weight Bearing Restrictions: No       Mobility Bed Mobility Overal bed mobility: Modified Independent Bed Mobility: Supine to Sit;Sit to Supine     Supine to sit: Modified independent (Device/Increase time);HOB elevated Sit to supine: Modified independent (Device/Increase time);HOB elevated   General bed mobility comments: used bedrail  Transfers Overall transfer level: Needs assistance Equipment used: Rolling walker (2 wheeled) Transfers: Sit to/from Stand Sit to Stand: Modified independent (Device/Increase time)              Balance                                   ADL                                         General ADL Comments: worked on retrieving items from Contractor with RW; pt is at supervision level. Cues for safety as pt moves quickly.  He was upset when he reached for ostomy supplies and didn't see his pattern or scissors.  Let his RN and Maudie Flakes know and assured him that we would get him whatever he needed prior to d/c.  Margarita Grizzle will work with him again in the am.        Programme researcher, broadcasting/film/video   Behavior During Therapy: [facility] for tasks assessed/performed Overall Cognitive Status: Within Functional Limits for tasks assessed                       Extremity/Trunk Assessment               Exercises     Shoulder Instructions       General Comments      Pertinent Vitals/ Pain       Pain Assessment: 0-10 Pain Score: 2  Pain Location: abdomen Pain Descriptors / Indicators: Sore Pain Intervention(s): Monitored during session;Repositioned;Premedicated before session  Home Living                                          Prior Functioning/Environment              Frequency Min 2X/week     Progress Toward Goals  OT Goals(current goals can now be found in the care plan section)  Progress towards OT goals: Progressing toward goals     Plan Discharge plan needs to be updated    Co-evaluation                 End of Session     Activity Tolerance Patient tolerated treatment well   Patient Left in bed;with call bell/phone within reach   Nurse Communication          Time: 1541-1601 OT Time Calculation (min): 20 min  Charges: OT General Charges $OT Visit: 1 Procedure OT Treatments $Therapeutic Activity: 8-22 mins  Eric [name] 12/11/2014, 4:12 PM   Eric [name], OTR/L [phone] 12/11/2014

## 2014-12-11 NOTE — Progress Notes (Signed)
Physical Therapy Treatment Patient Details Name: Eric [name] MRN: [id number] DOB: 1926-10-29 Today's Date: 12/11/2014    History of Present Illness s/p Exploratory laparotomy, sigmoid colectomy, descending colostomy on 11/28/14, DC to SNF 6/28, returned 7/1 with dehiscence of wound.    PT Comments    Progressing well with mobility. Possible d/c home tomorrow per pt. Recommend use of rollator (pt states he has one at home) when ambulating.   Follow Up Recommendations  Home health PT (pt declines placement)     Equipment Recommendations   (pt states he has rollator at home)    Recommendations for Other Services       Precautions / Restrictions Precautions Precaution Comments: recent ABD surgery and L colostomy Restrictions Weight Bearing Restrictions: No    Mobility  Bed Mobility Overal bed mobility: Modified Independent Bed Mobility: Supine to Sit;Sit to Supine     Supine to sit: Modified independent (Device/Increase time);HOB elevated Sit to supine: Modified independent (Device/Increase time);HOB elevated      Transfers Overall transfer level: Needs assistance Equipment used: Rolling walker (2 wheeled) Transfers: Sit to/from Stand Sit to Stand: Modified independent (Device/Increase time)            Ambulation/Gait Ambulation/Gait assistance: Supervision Ambulation Distance (Feet): 250 Feet (125' with RW, 125' with IV pole) Assistive device: Rolling walker (2 wheeled) (IV pole) Gait Pattern/deviations: Step-through pattern     General Gait Details: good gait speed. No LOB. Pt did well with walker and IV pole.    Stairs            Wheelchair Mobility    Modified Rankin (Stroke Patients Only)       Balance                                    Cognition Arousal/Alertness: Awake/alert Behavior During Therapy: WFL for tasks assessed/performed Overall Cognitive Status: Within Functional Limits for tasks assessed                       Exercises      General Comments        Pertinent Vitals/Pain Pain Assessment: 0-10 Pain Score: 3  Pain Location: abdomen Pain Descriptors / Indicators: Sore Pain Intervention(s): Monitored during session    Home Living                      Prior Function            PT Goals (current goals can now be found in the care plan section) Progress towards PT goals: Progressing toward goals    Frequency  Min 3X/week    PT Plan Current plan remains appropriate    Co-evaluation             End of Session   Activity Tolerance: Patient tolerated treatment well Patient left: in bed;with call bell/phone within reach;with family/visitor present     Time: 4098-1191 PT Time Calculation (min) (ACUTE ONLY): 9 min  Charges:  $Gait Training: 8-22 mins                    G Codes:      Weston Anna, MPT Pager: [phone]

## 2014-12-11 NOTE — Progress Notes (Signed)
Date:  December 11, 2014 Spoke with patient would like to use [facility] for home needs. Will continue to follow for Case Management needs.  Velva Harman, RN, BSN, [place]   [phone]

## 2014-12-12 DIAGNOSIS — I110 Hypertensive heart disease with heart failure: Secondary | ICD-10-CM

## 2014-12-12 DIAGNOSIS — J189 Pneumonia, unspecified organism: Principal | ICD-10-CM

## 2014-12-12 DIAGNOSIS — Z933 Colostomy status: Secondary | ICD-10-CM

## 2014-12-12 DIAGNOSIS — E669 Obesity, unspecified: Secondary | ICD-10-CM

## 2014-12-12 DIAGNOSIS — E119 Type 2 diabetes mellitus without complications: Secondary | ICD-10-CM

## 2014-12-12 DIAGNOSIS — I509 Heart failure, unspecified: Secondary | ICD-10-CM

## 2014-12-12 LAB — CULTURE, BLOOD (ROUTINE X 2)
CULTURE: NO GROWTH
Culture: NO GROWTH

## 2014-12-12 LAB — GLUCOSE, CAPILLARY
GLUCOSE-CAPILLARY: 122 mg/dL — AB (ref 65–99)
Glucose-Capillary: 110 mg/dL — ABNORMAL HIGH (ref 65–99)

## 2014-12-12 NOTE — Progress Notes (Signed)
[facility] PROGRESS NOTE  Eric [name] YYT:[id number] DOB: 08/06/1926 DOA: 12/07/2014 PCP: [name], MD  Assessment/Plan: 1. HCAP 1. Completed 7 day course of abx as of 7/6 2. Stable 3. Afebrile overnight 2. DM2 1. Stable on SSI coverage 3. CAD 1. Stable 2. asymptomatic 4. Leukocytosis 1. Secondary to HCAP 2. Improved 5. Obesity 1. Stable 6. Perforated sigmoid colon s/p Hartman's pouch 1. Followed by surgery 2. Wound care per home health  Code Status: Full Family Communication: Pt in room (indicate person spoken with, relationship, and if by phone, the number) Disposition Plan: Discharge home with home health today   Consultants:  General Surgery  Procedures:   HPI/Subjective: No complaints. Denies sob  Objective: Filed Vitals:   12/11/14 2342 12/12/14 0311 12/12/14 0555 12/12/14 1000  BP: 111/51 110/62  111/59  Pulse: 80 82  79  Temp: 97.5 F (36.4 C) 98.2 F (36.8 C)  98 F (36.7 C)  TempSrc: Oral Oral  Oral  Resp: 16 19  18   Height:      Weight:   92.035 kg (202 lb 14.4 oz)   SpO2: 99% 98%  97%    Intake/Output Summary (Last 24 hours) at 12/12/14 1259 Last data filed at 12/12/14 1028  Gross per 24 hour  Intake    770 ml  Output    525 ml  Net    245 ml   Filed Weights   12/08/14 0013 12/10/14 0725 12/12/14 0555  Weight: 93.4 kg (205 lb 14.6 oz) 93.305 kg (205 lb 11.2 oz) 92.035 kg (202 lb 14.4 oz)    Exam:   General:  Awake, in nad  Cardiovascular: regular, s1, s2  Respiratory: normal resp effort, no wheezing  Abdomen: soft,nondistended  Musculoskeletal: perfused, no clubbing   Data Reviewed: Basic Metabolic Panel:  Recent Labs Lab 12/07/14 1732 12/08/14 0350 12/09/14 0927 12/10/14 0530  NA 135 136 133* 134*  K 4.1 4.0 3.5 3.8  CL 99* 99* 97* 99*  CO2 28 29 28 27   GLUCOSE 127* 117* 173* 121*  BUN 11 10 15  21*  CREATININE 1.01 0.88 1.17 1.21  CALCIUM 10.3 10.3 10.3 10.3   Liver Function  Tests:  Recent Labs Lab 12/08/14 0350  AST 18  ALT 18  ALKPHOS 62  BILITOT 0.7  PROT 6.3*  ALBUMIN 3.1*   No results for input(s): LIPASE, AMYLASE in the last 168 hours. No results for input(s): AMMONIA in the last 168 hours. CBC:  Recent Labs Lab 12/07/14 1732 12/08/14 0350 12/09/14 0927 12/10/14 0530 12/11/14 0428  WBC 17.3* 12.0* 12.8* 13.0* 11.5*  NEUTROABS 14.5* 9.5*  --   --   --   HGB 10.8* 10.7* 10.5* 10.1* 10.0*  HCT 33.5* 33.4* 32.8* 31.7* 30.7*  MCV 98.2 98.8 97.3 98.4 98.4  PLT 267 276 280 331 343   Cardiac Enzymes: No results for input(s): CKTOTAL, CKMB, CKMBINDEX, TROPONINI in the last 168 hours. BNP (last 3 results)  Recent Labs  12/10/14 1445  BNP 32.7    ProBNP (last 3 results)  Recent Labs  02/23/14 1232  PROBNP 35.0    CBG:  Recent Labs Lab 12/11/14 1129 12/11/14 1647 12/11/14 2127 12/12/14 0712 12/12/14 1204  GLUCAP 153* 104* 117* 110* 122*    Recent Results (from the past 240 hour(s))  Urine culture     Status: None   Collection Time: 12/07/14  8:06 PM  Result Value Ref Range Status   Specimen Description URINE, RANDOM  Final  Special Requests NONE  Final   Culture   Final    7,000 COLONIES/mL INSIGNIFICANT GROWTH Performed at [facility]    Report Status 12/09/2014 FINAL  Final  Culture, blood (routine x 2)     Status: None (Preliminary result)   Collection Time: 12/07/14  8:55 PM  Result Value Ref Range Status   Specimen Description BLOOD RAC  Final   Special Requests BOTTLES DRAWN AEROBIC AND ANAEROBIC 5CC  Final   Culture   Final    NO GROWTH 4 DAYS Performed at [facility]    Report Status PENDING  Incomplete  Culture, blood (routine x 2)     Status: None (Preliminary result)   Collection Time: 12/07/14  9:05 PM  Result Value Ref Range Status   Specimen Description BLOOD RFARM  Final   Special Requests BOTTLES DRAWN AEROBIC ONLY 3CC  Final   Culture   Final    NO GROWTH 4 DAYS Performed  at [facility]    Report Status PENDING  Incomplete  MRSA PCR Screening     Status: None   Collection Time: 12/08/14 12:25 AM  Result Value Ref Range Status   MRSA by PCR NEGATIVE NEGATIVE Final    Comment:        The GeneXpert MRSA Assay (FDA approved for NASAL specimens only), is one component of a comprehensive MRSA colonization surveillance program. It is not intended to diagnose MRSA infection nor to guide or monitor treatment for MRSA infections.   Culture, sputum-assessment     Status: None   Collection Time: 12/08/14  2:09 AM  Result Value Ref Range Status   Specimen Description SPU  Final   Special Requests NONE  Final   Sputum evaluation   Final    THIS SPECIMEN IS ACCEPTABLE. RESPIRATORY CULTURE REPORT TO FOLLOW.   Report Status 12/08/2014 FINAL  Final  Culture, respiratory (NON-Expectorated)     Status: None   Collection Time: 12/08/14  2:09 AM  Result Value Ref Range Status   Specimen Description SPU  Final   Special Requests NONE  Final   Gram Stain   Final    NO WBC SEEN RARE SQUAMOUS EPITHELIAL CELLS PRESENT MODERATE GRAM NEGATIVE RODS Performed at [facility]    Culture   Final    ABUNDANT PSEUDOMONAS AERUGINOSA Performed at [facility]    Report Status 12/10/2014 FINAL  Final   Organism ID, Bacteria PSEUDOMONAS AERUGINOSA  Final      Susceptibility   Pseudomonas aeruginosa - MIC*    CEFEPIME 2 SENSITIVE Sensitive     CEFTAZIDIME 2 SENSITIVE Sensitive     CIPROFLOXACIN <=0.25 SENSITIVE Sensitive     GENTAMICIN 4 SENSITIVE Sensitive     IMIPENEM 2 SENSITIVE Sensitive     PIP/TAZO 8 SENSITIVE Sensitive     TOBRAMYCIN <=1 SENSITIVE Sensitive     * ABUNDANT PSEUDOMONAS AERUGINOSA     Studies: No results found.  Scheduled Meds: . antiseptic oral rinse  7 mL Mouth Rinse q12n4p  . aspirin EC  81 mg Oral Daily  . chlorhexidine  15 mL Mouth Rinse BID  . chlorthalidone  25 mg Oral Daily  . enoxaparin (LOVENOX) injection   40 mg Subcutaneous Q24H  . feeding supplement (RESOURCE BREEZE)  1 Container Oral BID BM  . fluticasone  2 spray Each Nare Daily  . guaiFENesin  600 mg Oral BID  . insulin aspart  0-15 Units Subcutaneous TID WC  . insulin  aspart  0-5 Units Subcutaneous QHS  . irbesartan  150 mg Oral Daily  . isosorbide mononitrate  30 mg Oral Daily  . levofloxacin  750 mg Oral Q48H  . pravastatin  60 mg Oral Daily   Continuous Infusions:   Principal Problem:   HCAP (healthcare-associated pneumonia) Active Problems:   Type 2 diabetes mellitus, controlled   Hypertensive heart disease with CHF   Coronary atherosclerosis   Leukocytosis   Chronic diastolic heart failure   Obesity (BMI 30-39.9)   Hartmann's pouch of intestine   Lindbergh Winkles K  [facility] Pager [phone]. If 7PM-7AM, please contact night-coverage at [email or web address], password [facility] 12/12/2014, 12:59 PM  LOS: 5 days

## 2014-12-12 NOTE — Progress Notes (Signed)
Patient ID: Eric [name], male   DOB: [birth year], 81 y.o.   MRN: [id number]     [address] [place], [place] [address]    Phone: [phone] FAX: [phone]     Subjective: Does not drink much water, encouraged to do so.  Feels comfortable with dressing changes.  He lives alone and does not wish to go to SNF, therapies recommending HH.  I have some reservation about this, but will dispo up to primary team.   Objective:  Vital signs:  Filed Vitals:   12/11/14 1957 12/11/14 2342 12/12/14 0311 12/12/14 0555  BP: 106/52 111/51 110/62   Pulse: 83 80 82   Temp: 98.4 F (36.9 C) 97.5 F (36.4 C) 98.2 F (36.8 C)   TempSrc: Oral Oral Oral   Resp: 16 16 19    Height:      Weight:    92.035 kg (202 lb 14.4 oz)  SpO2: 94% 99% 98%     Last BM Date: 12/11/14  Intake/Output   Yesterday:  07/05 0701 - 07/06 0700 In: 690 [P.O.:690] Out: 525 [Urine:525] This shift:  Total I/O In: 200 [P.O.:200] Out: -   Physical Exam: General: Pt awake/alert/oriented x4 in no acute distress  Abdomen: +bs, abdomen is soft and non tender. Wound is necrotic on the right side but base and left aspect of the wound is clean, there is some separation, but no visible bowel. LLQ ostomy is functioning.   Problem List:   Principal Problem:   HCAP (healthcare-associated pneumonia) Active Problems:   Type 2 diabetes mellitus, controlled   Hypertensive heart disease with CHF   Coronary atherosclerosis   Leukocytosis   Chronic diastolic heart failure   Obesity (BMI 30-39.9)   Hartmann's pouch of intestine    Results:   Labs: Results for orders placed or performed during the hospital encounter of 12/07/14 (from the past 48 hour(s))  Glucose, capillary     Status: Abnormal   Collection Time: 12/10/14 12:10 PM  Result Value Ref Range   Glucose-Capillary 135 (H) 65 - 99 mg/dL  Brain natriuretic peptide     Status: None   Collection Time: 12/10/14  2:45 PM  Result Value Ref Range   B Natriuretic Peptide 32.7 0.0 - 100.0 pg/mL  Glucose, capillary     Status: None   Collection Time: 12/10/14  5:06 PM  Result Value Ref Range   Glucose-Capillary 82 65 - 99 mg/dL  Glucose, capillary     Status: Abnormal   Collection Time: 12/10/14 10:00 PM  Result Value Ref Range   Glucose-Capillary 116 (H) 65 - 99 mg/dL  CBC     Status: Abnormal   Collection Time: 12/11/14  4:28 AM  Result Value Ref Range   WBC 11.5 (H) 4.0 - 10.5 K/uL   RBC 3.12 (L) 4.22 - 5.81 MIL/uL   Hemoglobin 10.0 (L) 13.0 - 17.0 g/dL   HCT 30.7 (L) 39.0 - 52.0 %   MCV 98.4 78.0 - 100.0 fL   MCH 32.1 26.0 - 34.0 pg   MCHC 32.6 30.0 - 36.0 g/dL   RDW 12.5 11.5 - 15.5 %   Platelets 343 150 - 400 K/uL  Glucose, capillary     Status: Abnormal   Collection Time: 12/11/14  7:40 AM  Result Value Ref Range   Glucose-Capillary 122 (H) 65 - 99 mg/dL   Comment 1 Notify RN   Glucose, capillary  Status: Abnormal   Collection Time: 12/11/14 11:29 AM  Result Value Ref Range   Glucose-Capillary 153 (H) 65 - 99 mg/dL   Comment 1 Notify RN   Glucose, capillary     Status: Abnormal   Collection Time: 12/11/14  4:47 PM  Result Value Ref Range   Glucose-Capillary 104 (H) 65 - 99 mg/dL   Comment 1 Notify RN   Glucose, capillary     Status: Abnormal   Collection Time: 12/11/14  9:27 PM  Result Value Ref Range   Glucose-Capillary 117 (H) 65 - 99 mg/dL  Glucose, capillary     Status: Abnormal   Collection Time: 12/12/14  7:12 AM  Result Value Ref Range   Glucose-Capillary 110 (H) 65 - 99 mg/dL   Comment 1 Notify RN     Imaging / Studies: Dg Chest 2 View  12/10/2014   CLINICAL DATA:  Six week history of intermittent chest pain and shortness of breath. Prior CABG.  EXAM: CHEST  2 VIEW  COMPARISON:  12/07/2014 and earlier.  FINDINGS: Prior sternotomy for CABG. Cardiac silhouette mildly enlarged, unchanged. Thoracic aorta mildly tortuous and atherosclerotic,  unchanged. Hilar and mediastinal contours otherwise unremarkable. Chronic linear scarring involving the lung bases, unchanged. Mild pulmonary venous hypertension without overt edema, unchanged. No new pulmonary parenchymal abnormalities. No pleural effusions. Degenerative changes involving the thoracic spine. No significant interval change dating back to September, 2015.  IMPRESSION: Mild cardiomegaly. Chronic mild pulmonary venous hypertension without overt edema. Scarring in the lower lobes. No acute cardiopulmonary disease. Stable examination.   Electronically Signed   By: Evangeline Dakin M.D.   On: 12/10/2014 12:21    Medications / Allergies:  Scheduled Meds: . antiseptic oral rinse  7 mL Mouth Rinse q12n4p  . aspirin EC  81 mg Oral Daily  . chlorhexidine  15 mL Mouth Rinse BID  . chlorthalidone  25 mg Oral Daily  . enoxaparin (LOVENOX) injection  40 mg Subcutaneous Q24H  . feeding supplement (RESOURCE BREEZE)  1 Container Oral BID BM  . fluticasone  2 spray Each Nare Daily  . guaiFENesin  600 mg Oral BID  . insulin aspart  0-15 Units Subcutaneous TID WC  . insulin aspart  0-5 Units Subcutaneous QHS  . irbesartan  150 mg Oral Daily  . isosorbide mononitrate  30 mg Oral Daily  . levofloxacin  750 mg Oral Q48H  . pravastatin  60 mg Oral Daily   Continuous Infusions:  PRN Meds:.albuterol, HYDROcodone-acetaminophen, oxyCODONE  Antibiotics: Anti-infectives    Start     Dose/Rate Route Frequency Ordered Stop   12/12/14 0700  levofloxacin (LEVAQUIN) tablet 750 mg     750 mg Oral Every 48 hours 12/11/14 1527     12/10/14 0100  vancomycin (VANCOCIN) IVPB 1000 mg/200 mL premix  Status:  Discontinued     1,000 mg 200 mL/hr over 60 Minutes Intravenous Every 12 hours 12/10/14 0009 12/10/14 1317   12/08/14 0100  piperacillin-tazobactam (ZOSYN) IVPB 3.375 g     3.375 g 12.5 mL/hr over 240 Minutes Intravenous Every 8 hours 12/08/14 0012 12/11/14 2204   12/08/14 0100  vancomycin (VANCOCIN) IVPB  750 mg/150 ml premix  Status:  Discontinued     750 mg 150 mL/hr over 60 Minutes Intravenous Every 12 hours 12/08/14 0031 12/10/14 0008        Assessment/Plan S/P exploratory laparotomy, sigmoid Colectomy, descending colostomy for perforated diverticulitis---Dr. Brien Mates 11/28/14 Readmitted for HCAP 7/1 -Surgically stable -Continue with BID Wet to dry  dressing changes, has necrosis right wound edge and VAC not appropriate -continue with ostomy teaching -mobilize, IS and pain control.  VTE prophylaxis-lovenox Dispo-stable for DC from a surgical standpoint, follow up arranged with Dr. Zella Richer.    Erby Pian, [facility] Surgery Pager [phone](7A-4:30P)   12/12/2014  8:45 AM

## 2014-12-12 NOTE — Progress Notes (Signed)
Eric [name] to be D/C'd Home per MD order.  Discussed prescriptions and follow up appointments with the patient. Prescriptions given to patient, medication list explained in detail. Pt verbalized understanding.    Medication List    STOP taking these medications        amoxicillin-clavulanate 875-125 MG per tablet  Commonly known as:  AUGMENTIN      TAKE these medications        aspirin EC 81 MG tablet  Take 1 tablet (81 mg total) by mouth daily.     chlorthalidone 25 MG tablet  Commonly known as:  HYGROTON  Take 25 mg by mouth daily.     feeding supplement (RESOURCE BREEZE) Liqd  Take 1 Container by mouth 2 (two) times daily between meals.     fluticasone 50 MCG/ACT nasal spray  Commonly known as:  FLONASE  Place 2 sprays into both nostrils daily. Allergies     HYDROcodone-acetaminophen 5-325 MG per tablet  Commonly known as:  NORCO/VICODIN  Take 1-2 tablets by mouth every 4 (four) hours as needed for moderate pain.     isosorbide mononitrate 30 MG 24 hr tablet  Commonly known as:  IMDUR  Take 1 tablet (30 mg total) by mouth every evening.     nitroGLYCERIN 0.4 MG SL tablet  Commonly known as:  NITROSTAT  Place 0.4 mg under the tongue every 5 (five) minutes as needed for chest pain. Chest pain     Oxycodone HCl 10 MG Tabs  Take 10 mg by mouth every 6 (six) hours as needed (sever e pain).     oxycodone 5 MG capsule  Commonly known as:  OXY-IR  Take 5 mg by mouth every 6 (six) hours as needed for pain.     pravastatin 20 MG tablet  Commonly known as:  PRAVACHOL  Take 3 tablets (60 mg total) by mouth daily.     PROVENTIL HFA 108 (90 BASE) MCG/ACT inhaler  Generic drug:  albuterol  Inhale 2 puffs into the lungs every 4 (four) hours as needed for shortness of breath. Wheezing     albuterol (2.5 MG/3ML) 0.083% nebulizer solution  Commonly known as:  PROVENTIL  USE 1 VIAL WITH NEBULIZER EVERY 4 HOURS AS NEEDED FOR WHEEZING     SYSTANE 0.4-0.3 % Soln  Generic  drug:  Polyethyl Glycol-Propyl Glycol  Apply 2 drops to eye 4 (four) times daily.     valsartan 160 MG tablet  Commonly known as:  DIOVAN  Take 1 tablet (160 mg total) by mouth daily.        Filed Vitals:   12/12/14 1444  BP: 103/50  Pulse: 89  Temp: 98.5 F (36.9 C)  Resp: 20    Skin clean, dry and intact without evidence of skin break down, no evidence of skin tears noted. IV catheter discontinued intact. Site without signs and symptoms of complications. Dressing and pressure applied. Pt denies pain at this time. No complaints noted.  An After Visit Summary was printed and given to the patient. Patient escorted via [place], and D/C home via private auto.  Lolita Rieger 12/12/2014 3:45 PM

## 2014-12-12 NOTE — Consult Note (Addendum)
WOC ostomy follow up Stoma type/location: LLQ Colostomy pouch changed by nght shift RN with patient assist (per his request) Stomal assessment/size: 1 and 1/2 inch round with mucocutaneous separation seen at last pouch change Peristomal assessment: Reported to be intact by bedside RN who received report from night nurse Treatment options for stomal/peristomal skin: Skin barrier ring Output brown stool Ostomy pouching: 1pc.convex pouch with skin barrier ring.  Education provided: Patient is going home with assistance of [place] and with follow up by [facility].  He is independent in emptying and will need assistance with pouch changes that are scheduled for M/W/F. Enrolled patient in [place] Start Discharge program: Yes (las admission) [facility] nursing team will not follow after discharge, but will remain available to this patient, the nursing and medical team.   Thanks, Maudie Flakes, MSN, RN, [place], [place], [place] [phone])

## 2014-12-13 DIAGNOSIS — Z48815 Encounter for surgical aftercare following surgery on the digestive system: Secondary | ICD-10-CM | POA: Diagnosis not present

## 2014-12-13 DIAGNOSIS — I255 Ischemic cardiomyopathy: Secondary | ICD-10-CM | POA: Diagnosis not present

## 2014-12-13 DIAGNOSIS — E669 Obesity, unspecified: Secondary | ICD-10-CM | POA: Diagnosis not present

## 2014-12-13 DIAGNOSIS — Z87891 Personal history of nicotine dependence: Secondary | ICD-10-CM | POA: Diagnosis not present

## 2014-12-13 DIAGNOSIS — Z433 Encounter for attention to colostomy: Secondary | ICD-10-CM | POA: Diagnosis not present

## 2014-12-13 DIAGNOSIS — I739 Peripheral vascular disease, unspecified: Secondary | ICD-10-CM | POA: Diagnosis not present

## 2014-12-13 DIAGNOSIS — E119 Type 2 diabetes mellitus without complications: Secondary | ICD-10-CM | POA: Diagnosis not present

## 2014-12-13 DIAGNOSIS — I110 Hypertensive heart disease with heart failure: Secondary | ICD-10-CM | POA: Diagnosis not present

## 2014-12-13 DIAGNOSIS — K589 Irritable bowel syndrome without diarrhea: Secondary | ICD-10-CM | POA: Diagnosis not present

## 2014-12-13 DIAGNOSIS — K219 Gastro-esophageal reflux disease without esophagitis: Secondary | ICD-10-CM | POA: Diagnosis not present

## 2014-12-13 DIAGNOSIS — Z8701 Personal history of pneumonia (recurrent): Secondary | ICD-10-CM | POA: Diagnosis not present

## 2014-12-13 DIAGNOSIS — J449 Chronic obstructive pulmonary disease, unspecified: Secondary | ICD-10-CM | POA: Diagnosis not present

## 2014-12-13 DIAGNOSIS — I5032 Chronic diastolic (congestive) heart failure: Secondary | ICD-10-CM | POA: Diagnosis not present

## 2014-12-14 DIAGNOSIS — Z48815 Encounter for surgical aftercare following surgery on the digestive system: Secondary | ICD-10-CM | POA: Diagnosis not present

## 2014-12-14 DIAGNOSIS — E119 Type 2 diabetes mellitus without complications: Secondary | ICD-10-CM | POA: Diagnosis not present

## 2014-12-14 DIAGNOSIS — I255 Ischemic cardiomyopathy: Secondary | ICD-10-CM | POA: Diagnosis not present

## 2014-12-14 DIAGNOSIS — I5032 Chronic diastolic (congestive) heart failure: Secondary | ICD-10-CM | POA: Diagnosis not present

## 2014-12-14 DIAGNOSIS — Z433 Encounter for attention to colostomy: Secondary | ICD-10-CM | POA: Diagnosis not present

## 2014-12-14 DIAGNOSIS — I110 Hypertensive heart disease with heart failure: Secondary | ICD-10-CM | POA: Diagnosis not present

## 2014-12-16 DIAGNOSIS — E119 Type 2 diabetes mellitus without complications: Secondary | ICD-10-CM | POA: Diagnosis not present

## 2014-12-16 DIAGNOSIS — Z48815 Encounter for surgical aftercare following surgery on the digestive system: Secondary | ICD-10-CM | POA: Diagnosis not present

## 2014-12-16 DIAGNOSIS — I5032 Chronic diastolic (congestive) heart failure: Secondary | ICD-10-CM | POA: Diagnosis not present

## 2014-12-16 DIAGNOSIS — I255 Ischemic cardiomyopathy: Secondary | ICD-10-CM | POA: Diagnosis not present

## 2014-12-16 DIAGNOSIS — Z433 Encounter for attention to colostomy: Secondary | ICD-10-CM | POA: Diagnosis not present

## 2014-12-16 DIAGNOSIS — I110 Hypertensive heart disease with heart failure: Secondary | ICD-10-CM | POA: Diagnosis not present

## 2014-12-17 DIAGNOSIS — I255 Ischemic cardiomyopathy: Secondary | ICD-10-CM | POA: Diagnosis not present

## 2014-12-17 DIAGNOSIS — Z433 Encounter for attention to colostomy: Secondary | ICD-10-CM | POA: Diagnosis not present

## 2014-12-17 DIAGNOSIS — Z48815 Encounter for surgical aftercare following surgery on the digestive system: Secondary | ICD-10-CM | POA: Diagnosis not present

## 2014-12-17 DIAGNOSIS — E119 Type 2 diabetes mellitus without complications: Secondary | ICD-10-CM | POA: Diagnosis not present

## 2014-12-17 DIAGNOSIS — I110 Hypertensive heart disease with heart failure: Secondary | ICD-10-CM | POA: Diagnosis not present

## 2014-12-17 DIAGNOSIS — I5032 Chronic diastolic (congestive) heart failure: Secondary | ICD-10-CM | POA: Diagnosis not present

## 2014-12-18 DIAGNOSIS — E119 Type 2 diabetes mellitus without complications: Secondary | ICD-10-CM | POA: Diagnosis not present

## 2014-12-18 DIAGNOSIS — Z48815 Encounter for surgical aftercare following surgery on the digestive system: Secondary | ICD-10-CM | POA: Diagnosis not present

## 2014-12-18 DIAGNOSIS — I110 Hypertensive heart disease with heart failure: Secondary | ICD-10-CM | POA: Diagnosis not present

## 2014-12-18 DIAGNOSIS — Z433 Encounter for attention to colostomy: Secondary | ICD-10-CM | POA: Diagnosis not present

## 2014-12-18 DIAGNOSIS — I5032 Chronic diastolic (congestive) heart failure: Secondary | ICD-10-CM | POA: Diagnosis not present

## 2014-12-18 DIAGNOSIS — I255 Ischemic cardiomyopathy: Secondary | ICD-10-CM | POA: Diagnosis not present

## 2014-12-19 DIAGNOSIS — I255 Ischemic cardiomyopathy: Secondary | ICD-10-CM | POA: Diagnosis not present

## 2014-12-19 DIAGNOSIS — I5032 Chronic diastolic (congestive) heart failure: Secondary | ICD-10-CM | POA: Diagnosis not present

## 2014-12-19 DIAGNOSIS — E119 Type 2 diabetes mellitus without complications: Secondary | ICD-10-CM | POA: Diagnosis not present

## 2014-12-19 DIAGNOSIS — Z48815 Encounter for surgical aftercare following surgery on the digestive system: Secondary | ICD-10-CM | POA: Diagnosis not present

## 2014-12-19 DIAGNOSIS — Z433 Encounter for attention to colostomy: Secondary | ICD-10-CM | POA: Diagnosis not present

## 2014-12-19 DIAGNOSIS — I110 Hypertensive heart disease with heart failure: Secondary | ICD-10-CM | POA: Diagnosis not present

## 2014-12-20 DIAGNOSIS — E119 Type 2 diabetes mellitus without complications: Secondary | ICD-10-CM | POA: Diagnosis not present

## 2014-12-20 DIAGNOSIS — I5032 Chronic diastolic (congestive) heart failure: Secondary | ICD-10-CM | POA: Diagnosis not present

## 2014-12-20 DIAGNOSIS — I110 Hypertensive heart disease with heart failure: Secondary | ICD-10-CM | POA: Diagnosis not present

## 2014-12-20 DIAGNOSIS — Z48815 Encounter for surgical aftercare following surgery on the digestive system: Secondary | ICD-10-CM | POA: Diagnosis not present

## 2014-12-20 DIAGNOSIS — Z433 Encounter for attention to colostomy: Secondary | ICD-10-CM | POA: Diagnosis not present

## 2014-12-20 DIAGNOSIS — I255 Ischemic cardiomyopathy: Secondary | ICD-10-CM | POA: Diagnosis not present

## 2014-12-21 DIAGNOSIS — Z433 Encounter for attention to colostomy: Secondary | ICD-10-CM | POA: Diagnosis not present

## 2014-12-21 DIAGNOSIS — Z48815 Encounter for surgical aftercare following surgery on the digestive system: Secondary | ICD-10-CM | POA: Diagnosis not present

## 2014-12-21 DIAGNOSIS — I5032 Chronic diastolic (congestive) heart failure: Secondary | ICD-10-CM | POA: Diagnosis not present

## 2014-12-21 DIAGNOSIS — E119 Type 2 diabetes mellitus without complications: Secondary | ICD-10-CM | POA: Diagnosis not present

## 2014-12-21 DIAGNOSIS — I110 Hypertensive heart disease with heart failure: Secondary | ICD-10-CM | POA: Diagnosis not present

## 2014-12-21 DIAGNOSIS — I255 Ischemic cardiomyopathy: Secondary | ICD-10-CM | POA: Diagnosis not present

## 2014-12-24 DIAGNOSIS — I255 Ischemic cardiomyopathy: Secondary | ICD-10-CM | POA: Diagnosis not present

## 2014-12-24 DIAGNOSIS — Z433 Encounter for attention to colostomy: Secondary | ICD-10-CM | POA: Diagnosis not present

## 2014-12-24 DIAGNOSIS — I5032 Chronic diastolic (congestive) heart failure: Secondary | ICD-10-CM | POA: Diagnosis not present

## 2014-12-24 DIAGNOSIS — Z48815 Encounter for surgical aftercare following surgery on the digestive system: Secondary | ICD-10-CM | POA: Diagnosis not present

## 2014-12-24 DIAGNOSIS — E119 Type 2 diabetes mellitus without complications: Secondary | ICD-10-CM | POA: Diagnosis not present

## 2014-12-24 DIAGNOSIS — I110 Hypertensive heart disease with heart failure: Secondary | ICD-10-CM | POA: Diagnosis not present

## 2014-12-25 DIAGNOSIS — I110 Hypertensive heart disease with heart failure: Secondary | ICD-10-CM | POA: Diagnosis not present

## 2014-12-25 DIAGNOSIS — I255 Ischemic cardiomyopathy: Secondary | ICD-10-CM | POA: Diagnosis not present

## 2014-12-25 DIAGNOSIS — I5032 Chronic diastolic (congestive) heart failure: Secondary | ICD-10-CM | POA: Diagnosis not present

## 2014-12-25 DIAGNOSIS — E119 Type 2 diabetes mellitus without complications: Secondary | ICD-10-CM | POA: Diagnosis not present

## 2014-12-25 DIAGNOSIS — Z48815 Encounter for surgical aftercare following surgery on the digestive system: Secondary | ICD-10-CM | POA: Diagnosis not present

## 2014-12-25 DIAGNOSIS — Z433 Encounter for attention to colostomy: Secondary | ICD-10-CM | POA: Diagnosis not present

## 2014-12-26 DIAGNOSIS — Z48815 Encounter for surgical aftercare following surgery on the digestive system: Secondary | ICD-10-CM | POA: Diagnosis not present

## 2014-12-26 DIAGNOSIS — I110 Hypertensive heart disease with heart failure: Secondary | ICD-10-CM | POA: Diagnosis not present

## 2014-12-26 DIAGNOSIS — I5032 Chronic diastolic (congestive) heart failure: Secondary | ICD-10-CM | POA: Diagnosis not present

## 2014-12-26 DIAGNOSIS — E119 Type 2 diabetes mellitus without complications: Secondary | ICD-10-CM | POA: Diagnosis not present

## 2014-12-26 DIAGNOSIS — I255 Ischemic cardiomyopathy: Secondary | ICD-10-CM | POA: Diagnosis not present

## 2014-12-26 DIAGNOSIS — Z433 Encounter for attention to colostomy: Secondary | ICD-10-CM | POA: Diagnosis not present

## 2014-12-27 DIAGNOSIS — Z433 Encounter for attention to colostomy: Secondary | ICD-10-CM | POA: Diagnosis not present

## 2014-12-27 DIAGNOSIS — E119 Type 2 diabetes mellitus without complications: Secondary | ICD-10-CM | POA: Diagnosis not present

## 2014-12-27 DIAGNOSIS — Z48815 Encounter for surgical aftercare following surgery on the digestive system: Secondary | ICD-10-CM | POA: Diagnosis not present

## 2014-12-27 DIAGNOSIS — I5032 Chronic diastolic (congestive) heart failure: Secondary | ICD-10-CM | POA: Diagnosis not present

## 2014-12-27 DIAGNOSIS — I110 Hypertensive heart disease with heart failure: Secondary | ICD-10-CM | POA: Diagnosis not present

## 2014-12-27 DIAGNOSIS — I255 Ischemic cardiomyopathy: Secondary | ICD-10-CM | POA: Diagnosis not present

## 2014-12-28 DIAGNOSIS — E119 Type 2 diabetes mellitus without complications: Secondary | ICD-10-CM | POA: Diagnosis not present

## 2014-12-28 DIAGNOSIS — I255 Ischemic cardiomyopathy: Secondary | ICD-10-CM | POA: Diagnosis not present

## 2014-12-28 DIAGNOSIS — I5032 Chronic diastolic (congestive) heart failure: Secondary | ICD-10-CM | POA: Diagnosis not present

## 2014-12-28 DIAGNOSIS — I110 Hypertensive heart disease with heart failure: Secondary | ICD-10-CM | POA: Diagnosis not present

## 2014-12-28 DIAGNOSIS — Z433 Encounter for attention to colostomy: Secondary | ICD-10-CM | POA: Diagnosis not present

## 2014-12-28 DIAGNOSIS — Z48815 Encounter for surgical aftercare following surgery on the digestive system: Secondary | ICD-10-CM | POA: Diagnosis not present

## 2014-12-31 ENCOUNTER — Telehealth: Payer: Self-pay | Admitting: Internal Medicine

## 2014-12-31 DIAGNOSIS — I5032 Chronic diastolic (congestive) heart failure: Secondary | ICD-10-CM | POA: Diagnosis not present

## 2014-12-31 DIAGNOSIS — Z48815 Encounter for surgical aftercare following surgery on the digestive system: Secondary | ICD-10-CM | POA: Diagnosis not present

## 2014-12-31 DIAGNOSIS — E119 Type 2 diabetes mellitus without complications: Secondary | ICD-10-CM | POA: Diagnosis not present

## 2014-12-31 DIAGNOSIS — Z433 Encounter for attention to colostomy: Secondary | ICD-10-CM | POA: Diagnosis not present

## 2014-12-31 DIAGNOSIS — I110 Hypertensive heart disease with heart failure: Secondary | ICD-10-CM | POA: Diagnosis not present

## 2014-12-31 DIAGNOSIS — I255 Ischemic cardiomyopathy: Secondary | ICD-10-CM | POA: Diagnosis not present

## 2014-12-31 NOTE — Telephone Encounter (Signed)
[facility] Contacted and Verbal given.

## 2014-12-31 NOTE — Telephone Encounter (Signed)
Can reduce from 2 x a week to 1 x a week on PT because patient is dealing with a wound vac and there is only so much that patient can do.  Is requesting verbal ok.

## 2015-01-01 DIAGNOSIS — I5032 Chronic diastolic (congestive) heart failure: Secondary | ICD-10-CM | POA: Diagnosis not present

## 2015-01-01 DIAGNOSIS — I255 Ischemic cardiomyopathy: Secondary | ICD-10-CM | POA: Diagnosis not present

## 2015-01-01 DIAGNOSIS — E119 Type 2 diabetes mellitus without complications: Secondary | ICD-10-CM | POA: Diagnosis not present

## 2015-01-01 DIAGNOSIS — Z433 Encounter for attention to colostomy: Secondary | ICD-10-CM | POA: Diagnosis not present

## 2015-01-01 DIAGNOSIS — Z48815 Encounter for surgical aftercare following surgery on the digestive system: Secondary | ICD-10-CM | POA: Diagnosis not present

## 2015-01-01 DIAGNOSIS — I110 Hypertensive heart disease with heart failure: Secondary | ICD-10-CM | POA: Diagnosis not present

## 2015-01-02 DIAGNOSIS — I5032 Chronic diastolic (congestive) heart failure: Secondary | ICD-10-CM | POA: Diagnosis not present

## 2015-01-02 DIAGNOSIS — Z48815 Encounter for surgical aftercare following surgery on the digestive system: Secondary | ICD-10-CM | POA: Diagnosis not present

## 2015-01-02 DIAGNOSIS — I110 Hypertensive heart disease with heart failure: Secondary | ICD-10-CM | POA: Diagnosis not present

## 2015-01-02 DIAGNOSIS — E119 Type 2 diabetes mellitus without complications: Secondary | ICD-10-CM | POA: Diagnosis not present

## 2015-01-02 DIAGNOSIS — Z433 Encounter for attention to colostomy: Secondary | ICD-10-CM | POA: Diagnosis not present

## 2015-01-02 DIAGNOSIS — I255 Ischemic cardiomyopathy: Secondary | ICD-10-CM | POA: Diagnosis not present

## 2015-01-03 DIAGNOSIS — I5032 Chronic diastolic (congestive) heart failure: Secondary | ICD-10-CM | POA: Diagnosis not present

## 2015-01-03 DIAGNOSIS — Z48815 Encounter for surgical aftercare following surgery on the digestive system: Secondary | ICD-10-CM | POA: Diagnosis not present

## 2015-01-03 DIAGNOSIS — I110 Hypertensive heart disease with heart failure: Secondary | ICD-10-CM | POA: Diagnosis not present

## 2015-01-03 DIAGNOSIS — Z433 Encounter for attention to colostomy: Secondary | ICD-10-CM | POA: Diagnosis not present

## 2015-01-03 DIAGNOSIS — I255 Ischemic cardiomyopathy: Secondary | ICD-10-CM | POA: Diagnosis not present

## 2015-01-03 DIAGNOSIS — E119 Type 2 diabetes mellitus without complications: Secondary | ICD-10-CM | POA: Diagnosis not present

## 2015-01-04 ENCOUNTER — Inpatient Hospital Stay: Payer: Medicare Other | Admitting: Internal Medicine

## 2015-01-04 DIAGNOSIS — H02054 Trichiasis without entropian left upper eyelid: Secondary | ICD-10-CM | POA: Diagnosis not present

## 2015-01-04 DIAGNOSIS — I110 Hypertensive heart disease with heart failure: Secondary | ICD-10-CM | POA: Diagnosis not present

## 2015-01-04 DIAGNOSIS — Z433 Encounter for attention to colostomy: Secondary | ICD-10-CM | POA: Diagnosis not present

## 2015-01-04 DIAGNOSIS — H16103 Unspecified superficial keratitis, bilateral: Secondary | ICD-10-CM | POA: Diagnosis not present

## 2015-01-04 DIAGNOSIS — I5032 Chronic diastolic (congestive) heart failure: Secondary | ICD-10-CM | POA: Diagnosis not present

## 2015-01-04 DIAGNOSIS — Z48815 Encounter for surgical aftercare following surgery on the digestive system: Secondary | ICD-10-CM | POA: Diagnosis not present

## 2015-01-04 DIAGNOSIS — E119 Type 2 diabetes mellitus without complications: Secondary | ICD-10-CM | POA: Diagnosis not present

## 2015-01-04 DIAGNOSIS — H02051 Trichiasis without entropian right upper eyelid: Secondary | ICD-10-CM | POA: Diagnosis not present

## 2015-01-04 DIAGNOSIS — I255 Ischemic cardiomyopathy: Secondary | ICD-10-CM | POA: Diagnosis not present

## 2015-01-04 DIAGNOSIS — H04123 Dry eye syndrome of bilateral lacrimal glands: Secondary | ICD-10-CM | POA: Diagnosis not present

## 2015-01-07 ENCOUNTER — Inpatient Hospital Stay: Payer: Medicare Other | Admitting: Internal Medicine

## 2015-01-07 DIAGNOSIS — I110 Hypertensive heart disease with heart failure: Secondary | ICD-10-CM | POA: Diagnosis not present

## 2015-01-07 DIAGNOSIS — Z48815 Encounter for surgical aftercare following surgery on the digestive system: Secondary | ICD-10-CM | POA: Diagnosis not present

## 2015-01-07 DIAGNOSIS — E119 Type 2 diabetes mellitus without complications: Secondary | ICD-10-CM | POA: Diagnosis not present

## 2015-01-07 DIAGNOSIS — I255 Ischemic cardiomyopathy: Secondary | ICD-10-CM | POA: Diagnosis not present

## 2015-01-07 DIAGNOSIS — Z433 Encounter for attention to colostomy: Secondary | ICD-10-CM | POA: Diagnosis not present

## 2015-01-07 DIAGNOSIS — I5032 Chronic diastolic (congestive) heart failure: Secondary | ICD-10-CM | POA: Diagnosis not present

## 2015-01-09 DIAGNOSIS — I110 Hypertensive heart disease with heart failure: Secondary | ICD-10-CM | POA: Diagnosis not present

## 2015-01-09 DIAGNOSIS — I5032 Chronic diastolic (congestive) heart failure: Secondary | ICD-10-CM | POA: Diagnosis not present

## 2015-01-09 DIAGNOSIS — E119 Type 2 diabetes mellitus without complications: Secondary | ICD-10-CM | POA: Diagnosis not present

## 2015-01-09 DIAGNOSIS — Z433 Encounter for attention to colostomy: Secondary | ICD-10-CM | POA: Diagnosis not present

## 2015-01-09 DIAGNOSIS — Z48815 Encounter for surgical aftercare following surgery on the digestive system: Secondary | ICD-10-CM | POA: Diagnosis not present

## 2015-01-09 DIAGNOSIS — I255 Ischemic cardiomyopathy: Secondary | ICD-10-CM | POA: Diagnosis not present

## 2015-01-11 DIAGNOSIS — I110 Hypertensive heart disease with heart failure: Secondary | ICD-10-CM | POA: Diagnosis not present

## 2015-01-11 DIAGNOSIS — Z433 Encounter for attention to colostomy: Secondary | ICD-10-CM | POA: Diagnosis not present

## 2015-01-11 DIAGNOSIS — Z48815 Encounter for surgical aftercare following surgery on the digestive system: Secondary | ICD-10-CM | POA: Diagnosis not present

## 2015-01-11 DIAGNOSIS — I5032 Chronic diastolic (congestive) heart failure: Secondary | ICD-10-CM | POA: Diagnosis not present

## 2015-01-11 DIAGNOSIS — E119 Type 2 diabetes mellitus without complications: Secondary | ICD-10-CM | POA: Diagnosis not present

## 2015-01-11 DIAGNOSIS — I255 Ischemic cardiomyopathy: Secondary | ICD-10-CM | POA: Diagnosis not present

## 2015-01-14 DIAGNOSIS — Z48815 Encounter for surgical aftercare following surgery on the digestive system: Secondary | ICD-10-CM | POA: Diagnosis not present

## 2015-01-14 DIAGNOSIS — I110 Hypertensive heart disease with heart failure: Secondary | ICD-10-CM | POA: Diagnosis not present

## 2015-01-14 DIAGNOSIS — E119 Type 2 diabetes mellitus without complications: Secondary | ICD-10-CM | POA: Diagnosis not present

## 2015-01-14 DIAGNOSIS — I5032 Chronic diastolic (congestive) heart failure: Secondary | ICD-10-CM | POA: Diagnosis not present

## 2015-01-14 DIAGNOSIS — I255 Ischemic cardiomyopathy: Secondary | ICD-10-CM | POA: Diagnosis not present

## 2015-01-14 DIAGNOSIS — Z433 Encounter for attention to colostomy: Secondary | ICD-10-CM | POA: Diagnosis not present

## 2015-01-15 ENCOUNTER — Other Ambulatory Visit: Payer: Self-pay | Admitting: Endocrinology

## 2015-01-15 ENCOUNTER — Telehealth: Payer: Self-pay | Admitting: *Deleted

## 2015-01-15 ENCOUNTER — Telehealth: Payer: Self-pay | Admitting: Endocrinology

## 2015-01-15 DIAGNOSIS — I5032 Chronic diastolic (congestive) heart failure: Secondary | ICD-10-CM | POA: Diagnosis not present

## 2015-01-15 DIAGNOSIS — E119 Type 2 diabetes mellitus without complications: Secondary | ICD-10-CM | POA: Diagnosis not present

## 2015-01-15 DIAGNOSIS — Z433 Encounter for attention to colostomy: Secondary | ICD-10-CM | POA: Diagnosis not present

## 2015-01-15 DIAGNOSIS — I110 Hypertensive heart disease with heart failure: Secondary | ICD-10-CM | POA: Diagnosis not present

## 2015-01-15 DIAGNOSIS — Z48815 Encounter for surgical aftercare following surgery on the digestive system: Secondary | ICD-10-CM | POA: Diagnosis not present

## 2015-01-15 DIAGNOSIS — I255 Ischemic cardiomyopathy: Secondary | ICD-10-CM | POA: Diagnosis not present

## 2015-01-15 MED ORDER — GLUCOSE BLOOD VI STRP: 1.0000 | ORAL_STRIP | Freq: Two times a day (BID) | Status: DC

## 2015-01-15 NOTE — Telephone Encounter (Signed)
Patient has not been here since 2014, I explained to him that we couldn't call in an rx for him unless he was seen, patient was upset and didn't understand why we couldn't do this for him.  He does have an appointment with Dr. Doug Sou and I told him to check with their office to see if they could do this for him.

## 2015-01-15 NOTE — Telephone Encounter (Signed)
Patient called stating that he would like a refill on his Rx  Rx: One Touch Ultra test strips   Pharmacy: Please send to [facility]

## 2015-01-15 NOTE — Telephone Encounter (Signed)
Pt is requesting refill on his one touch ultra strips. Verified pharmacy inform pt will send to [facility]...Eric [name]

## 2015-01-16 DIAGNOSIS — Z433 Encounter for attention to colostomy: Secondary | ICD-10-CM | POA: Diagnosis not present

## 2015-01-16 DIAGNOSIS — Z48815 Encounter for surgical aftercare following surgery on the digestive system: Secondary | ICD-10-CM | POA: Diagnosis not present

## 2015-01-16 DIAGNOSIS — I255 Ischemic cardiomyopathy: Secondary | ICD-10-CM | POA: Diagnosis not present

## 2015-01-16 DIAGNOSIS — I110 Hypertensive heart disease with heart failure: Secondary | ICD-10-CM | POA: Diagnosis not present

## 2015-01-16 DIAGNOSIS — I5032 Chronic diastolic (congestive) heart failure: Secondary | ICD-10-CM | POA: Diagnosis not present

## 2015-01-16 DIAGNOSIS — E119 Type 2 diabetes mellitus without complications: Secondary | ICD-10-CM | POA: Diagnosis not present

## 2015-01-17 ENCOUNTER — Inpatient Hospital Stay: Payer: Medicare Other | Admitting: Internal Medicine

## 2015-01-18 DIAGNOSIS — Z48815 Encounter for surgical aftercare following surgery on the digestive system: Secondary | ICD-10-CM | POA: Diagnosis not present

## 2015-01-18 DIAGNOSIS — E119 Type 2 diabetes mellitus without complications: Secondary | ICD-10-CM | POA: Diagnosis not present

## 2015-01-18 DIAGNOSIS — Z433 Encounter for attention to colostomy: Secondary | ICD-10-CM | POA: Diagnosis not present

## 2015-01-18 DIAGNOSIS — I110 Hypertensive heart disease with heart failure: Secondary | ICD-10-CM | POA: Diagnosis not present

## 2015-01-18 DIAGNOSIS — I5032 Chronic diastolic (congestive) heart failure: Secondary | ICD-10-CM | POA: Diagnosis not present

## 2015-01-18 DIAGNOSIS — I255 Ischemic cardiomyopathy: Secondary | ICD-10-CM | POA: Diagnosis not present

## 2015-01-21 DIAGNOSIS — Z433 Encounter for attention to colostomy: Secondary | ICD-10-CM | POA: Diagnosis not present

## 2015-01-21 DIAGNOSIS — Z48815 Encounter for surgical aftercare following surgery on the digestive system: Secondary | ICD-10-CM | POA: Diagnosis not present

## 2015-01-21 DIAGNOSIS — I110 Hypertensive heart disease with heart failure: Secondary | ICD-10-CM | POA: Diagnosis not present

## 2015-01-21 DIAGNOSIS — E119 Type 2 diabetes mellitus without complications: Secondary | ICD-10-CM | POA: Diagnosis not present

## 2015-01-21 DIAGNOSIS — I5032 Chronic diastolic (congestive) heart failure: Secondary | ICD-10-CM | POA: Diagnosis not present

## 2015-01-21 DIAGNOSIS — I255 Ischemic cardiomyopathy: Secondary | ICD-10-CM | POA: Diagnosis not present

## 2015-01-22 DIAGNOSIS — E119 Type 2 diabetes mellitus without complications: Secondary | ICD-10-CM | POA: Diagnosis not present

## 2015-01-22 DIAGNOSIS — Z433 Encounter for attention to colostomy: Secondary | ICD-10-CM | POA: Diagnosis not present

## 2015-01-22 DIAGNOSIS — Z48815 Encounter for surgical aftercare following surgery on the digestive system: Secondary | ICD-10-CM | POA: Diagnosis not present

## 2015-01-22 DIAGNOSIS — I110 Hypertensive heart disease with heart failure: Secondary | ICD-10-CM | POA: Diagnosis not present

## 2015-01-22 DIAGNOSIS — I255 Ischemic cardiomyopathy: Secondary | ICD-10-CM | POA: Diagnosis not present

## 2015-01-22 DIAGNOSIS — I5032 Chronic diastolic (congestive) heart failure: Secondary | ICD-10-CM | POA: Diagnosis not present

## 2015-01-23 DIAGNOSIS — E119 Type 2 diabetes mellitus without complications: Secondary | ICD-10-CM | POA: Diagnosis not present

## 2015-01-23 DIAGNOSIS — Z433 Encounter for attention to colostomy: Secondary | ICD-10-CM | POA: Diagnosis not present

## 2015-01-23 DIAGNOSIS — Z48815 Encounter for surgical aftercare following surgery on the digestive system: Secondary | ICD-10-CM | POA: Diagnosis not present

## 2015-01-23 DIAGNOSIS — I5032 Chronic diastolic (congestive) heart failure: Secondary | ICD-10-CM | POA: Diagnosis not present

## 2015-01-23 DIAGNOSIS — I110 Hypertensive heart disease with heart failure: Secondary | ICD-10-CM | POA: Diagnosis not present

## 2015-01-23 DIAGNOSIS — I255 Ischemic cardiomyopathy: Secondary | ICD-10-CM | POA: Diagnosis not present

## 2015-01-25 DIAGNOSIS — I110 Hypertensive heart disease with heart failure: Secondary | ICD-10-CM | POA: Diagnosis not present

## 2015-01-25 DIAGNOSIS — I5032 Chronic diastolic (congestive) heart failure: Secondary | ICD-10-CM | POA: Diagnosis not present

## 2015-01-25 DIAGNOSIS — Z48815 Encounter for surgical aftercare following surgery on the digestive system: Secondary | ICD-10-CM | POA: Diagnosis not present

## 2015-01-25 DIAGNOSIS — E119 Type 2 diabetes mellitus without complications: Secondary | ICD-10-CM | POA: Diagnosis not present

## 2015-01-25 DIAGNOSIS — I255 Ischemic cardiomyopathy: Secondary | ICD-10-CM | POA: Diagnosis not present

## 2015-01-25 DIAGNOSIS — Z433 Encounter for attention to colostomy: Secondary | ICD-10-CM | POA: Diagnosis not present

## 2015-01-28 ENCOUNTER — Ambulatory Visit (INDEPENDENT_AMBULATORY_CARE_PROVIDER_SITE_OTHER): Payer: Medicare Other | Admitting: *Deleted

## 2015-01-28 DIAGNOSIS — I110 Hypertensive heart disease with heart failure: Secondary | ICD-10-CM | POA: Diagnosis not present

## 2015-01-28 DIAGNOSIS — Z433 Encounter for attention to colostomy: Secondary | ICD-10-CM | POA: Diagnosis not present

## 2015-01-28 DIAGNOSIS — I255 Ischemic cardiomyopathy: Secondary | ICD-10-CM | POA: Diagnosis not present

## 2015-01-28 DIAGNOSIS — Z950 Presence of cardiac pacemaker: Secondary | ICD-10-CM | POA: Diagnosis not present

## 2015-01-28 DIAGNOSIS — Z48815 Encounter for surgical aftercare following surgery on the digestive system: Secondary | ICD-10-CM | POA: Diagnosis not present

## 2015-01-28 DIAGNOSIS — I495 Sick sinus syndrome: Secondary | ICD-10-CM | POA: Diagnosis not present

## 2015-01-28 DIAGNOSIS — I5032 Chronic diastolic (congestive) heart failure: Secondary | ICD-10-CM | POA: Diagnosis not present

## 2015-01-28 DIAGNOSIS — E119 Type 2 diabetes mellitus without complications: Secondary | ICD-10-CM | POA: Diagnosis not present

## 2015-01-28 LAB — CUP PACEART INCLINIC DEVICE CHECK
Battery Impedance: 1300 Ohm
Date Time Interrogation Session: 20160822103206
Lead Channel Pacing Threshold Amplitude: 0.75 V
Lead Channel Sensing Intrinsic Amplitude: 4.7 mV
Lead Channel Setting Pacing Amplitude: 2 V
Lead Channel Setting Pacing Pulse Width: 0.4 ms
MDC IDC MSMT BATTERY VOLTAGE: 2.76 V
MDC IDC MSMT LEADCHNL RA IMPEDANCE VALUE: 395 Ohm
MDC IDC MSMT LEADCHNL RA PACING THRESHOLD PULSEWIDTH: 0.4 ms
MDC IDC MSMT LEADCHNL RV IMPEDANCE VALUE: 487 Ohm
MDC IDC MSMT LEADCHNL RV PACING THRESHOLD AMPLITUDE: 0.75 V
MDC IDC MSMT LEADCHNL RV PACING THRESHOLD PULSEWIDTH: 0.4 ms
MDC IDC MSMT LEADCHNL RV SENSING INTR AMPL: 11 mV
MDC IDC PG SERIAL: 2094876
MDC IDC SET LEADCHNL RV SENSING SENSITIVITY: 2 mV

## 2015-01-28 NOTE — Progress Notes (Signed)
Pacemaker check in clinic. Normal device function. Thresholds, sensing, impedances consistent with previous measurements. Device programmed to maximize longevity. 4 mode switches--- <1%. Device programmed at appropriate safety margins. Histogram distribution appropriate for patient activity level. Device programmed to optimize intrinsic conduction. Estimated longevity 5.5-9.16yrs. ROV w/ SK in 67mo.

## 2015-01-30 DIAGNOSIS — Z433 Encounter for attention to colostomy: Secondary | ICD-10-CM | POA: Diagnosis not present

## 2015-01-30 DIAGNOSIS — I110 Hypertensive heart disease with heart failure: Secondary | ICD-10-CM | POA: Diagnosis not present

## 2015-01-30 DIAGNOSIS — I5032 Chronic diastolic (congestive) heart failure: Secondary | ICD-10-CM | POA: Diagnosis not present

## 2015-01-30 DIAGNOSIS — I255 Ischemic cardiomyopathy: Secondary | ICD-10-CM | POA: Diagnosis not present

## 2015-01-30 DIAGNOSIS — E119 Type 2 diabetes mellitus without complications: Secondary | ICD-10-CM | POA: Diagnosis not present

## 2015-01-30 DIAGNOSIS — Z48815 Encounter for surgical aftercare following surgery on the digestive system: Secondary | ICD-10-CM | POA: Diagnosis not present

## 2015-01-30 DIAGNOSIS — Z950 Presence of cardiac pacemaker: Secondary | ICD-10-CM | POA: Diagnosis not present

## 2015-01-31 ENCOUNTER — Encounter: Payer: Self-pay | Admitting: Internal Medicine

## 2015-01-31 ENCOUNTER — Ambulatory Visit (INDEPENDENT_AMBULATORY_CARE_PROVIDER_SITE_OTHER): Payer: Medicare Other | Admitting: Internal Medicine

## 2015-01-31 VITALS — BP 112/58 | HR 95 | Temp 97.9°F | Resp 18 | Ht 66.0 in | Wt 202.0 lb

## 2015-01-31 DIAGNOSIS — J189 Pneumonia, unspecified organism: Secondary | ICD-10-CM | POA: Diagnosis not present

## 2015-01-31 DIAGNOSIS — E119 Type 2 diabetes mellitus without complications: Secondary | ICD-10-CM

## 2015-01-31 DIAGNOSIS — I5032 Chronic diastolic (congestive) heart failure: Secondary | ICD-10-CM

## 2015-01-31 DIAGNOSIS — I25810 Atherosclerosis of coronary artery bypass graft(s) without angina pectoris: Secondary | ICD-10-CM

## 2015-01-31 DIAGNOSIS — K5720 Diverticulitis of large intestine with perforation and abscess without bleeding: Secondary | ICD-10-CM | POA: Diagnosis not present

## 2015-01-31 MED ORDER — LEVOFLOXACIN 750 MG PO TABS: 750.0000 mg | ORAL_TABLET | Freq: Every day | ORAL | Status: DC

## 2015-01-31 NOTE — Patient Instructions (Signed)
I have sent in an antibiotic for the likely pneumonia. It is called levaquin and you have taken it before. Take 1 pill daily until gone (10 days). If you are not better once it is gone please call us back and we will need to do a chest x-ray.

## 2015-01-31 NOTE — Progress Notes (Signed)
Pre visit review using our clinic review tool, if applicable. No additional management support is needed unless otherwise documented below in the visit note. 

## 2015-02-01 ENCOUNTER — Encounter: Payer: Self-pay | Admitting: Internal Medicine

## 2015-02-01 DIAGNOSIS — Z48815 Encounter for surgical aftercare following surgery on the digestive system: Secondary | ICD-10-CM | POA: Diagnosis not present

## 2015-02-01 DIAGNOSIS — Z433 Encounter for attention to colostomy: Secondary | ICD-10-CM | POA: Diagnosis not present

## 2015-02-01 DIAGNOSIS — I255 Ischemic cardiomyopathy: Secondary | ICD-10-CM | POA: Diagnosis not present

## 2015-02-01 DIAGNOSIS — I110 Hypertensive heart disease with heart failure: Secondary | ICD-10-CM | POA: Diagnosis not present

## 2015-02-01 DIAGNOSIS — I5032 Chronic diastolic (congestive) heart failure: Secondary | ICD-10-CM | POA: Diagnosis not present

## 2015-02-01 DIAGNOSIS — E119 Type 2 diabetes mellitus without complications: Secondary | ICD-10-CM | POA: Diagnosis not present

## 2015-02-01 NOTE — Progress Notes (Signed)
   Subjective:    Patient ID: Eric [name], male    DOB: 1927-03-02, 81 y.o.   MRN: [id number]  HPI The patient is an 81 YO man coming for multiple hospital follow ups (first for a colon resection which had a leak, hartman pouch, then to rehab where he developed HCAP and returned for an additional 6 day hospital stay). He was sent home on levaquin and improved. He refused to return to the nursing home and severly regrets having the surgery. He went home and is managing okay. Denies falls since being home. No confusion with his meds. Has been coughing again the last 3 weeks and mild chills. Some SOB with exertion but he is not sure his endurance is back from the multiple hospital stays. Yellow sputum. Denies nasal congestion. Getting wound care with wound wac at home. Still some pain in his stomach but also improving gradually.   Review of Systems  Constitutional: Positive for chills, activity change and fatigue. Negative for fever, appetite change and unexpected weight change.  HENT: Negative.   Eyes: Negative.   Respiratory: Positive for cough and shortness of breath. Negative for chest tightness and wheezing.   Cardiovascular: Negative for chest pain, palpitations and leg swelling.  Gastrointestinal: Positive for abdominal pain and abdominal distention. Negative for nausea, vomiting, diarrhea and constipation.  Skin: Positive for wound.  Neurological: Negative.       Objective:   Physical Exam  Constitutional: He is oriented to person, place, and time. He appears well-developed and well-nourished.  HENT:  Head: Normocephalic and atraumatic.  Eyes: EOM are normal.  Neck: Normal range of motion.  Cardiovascular: Normal rate and regular rhythm.   Pulmonary/Chest: Effort normal. No respiratory distress. He has wheezes. He has no rales. He exhibits no tenderness.  Crackles LUL with some wheezing, right lung clear.   Abdominal: Soft. He exhibits no distension. There is no tenderness.  There is no rebound.  Musculoskeletal: He exhibits no edema.  Neurological: He is alert and oriented to person, place, and time. Coordination normal.  Skin: Skin is warm and dry.  Psychiatric: He has a normal mood and affect.   Filed Vitals:   01/31/15 1527  BP: 112/58  Pulse: 95  Temp: 97.9 F (36.6 C)  TempSrc: Oral  Resp: 18  Height: 5\' 6"  (1.676 m)  Weight: 202 lb (91.627 kg)  SpO2: 95%      Assessment & Plan:  Wanted to get a CXR but the patient did not wish to stay.

## 2015-02-01 NOTE — Assessment & Plan Note (Signed)
Not in exacerbation today, continue imdur, ASA, valsartan, pravastatin.

## 2015-02-01 NOTE — Assessment & Plan Note (Signed)
Concern that this is back or not resolved. Unfortunately he is unable to stay for chest x-ray today. Rx for levaquin 750 mg for 10 days. If no improvement in 5-7 days he will call back and get the chest x-ray.

## 2015-02-01 NOTE — Assessment & Plan Note (Signed)
Infection resolved, still with hartman pouch.

## 2015-02-01 NOTE — Assessment & Plan Note (Signed)
Not currently on medication, denies symptoms of high or low sugars. On ARB.

## 2015-02-04 ENCOUNTER — Telehealth: Payer: Self-pay | Admitting: Interventional Cardiology

## 2015-02-04 DIAGNOSIS — I110 Hypertensive heart disease with heart failure: Secondary | ICD-10-CM | POA: Diagnosis not present

## 2015-02-04 DIAGNOSIS — Z48815 Encounter for surgical aftercare following surgery on the digestive system: Secondary | ICD-10-CM | POA: Diagnosis not present

## 2015-02-04 DIAGNOSIS — Z433 Encounter for attention to colostomy: Secondary | ICD-10-CM | POA: Diagnosis not present

## 2015-02-04 DIAGNOSIS — E119 Type 2 diabetes mellitus without complications: Secondary | ICD-10-CM | POA: Diagnosis not present

## 2015-02-04 DIAGNOSIS — I255 Ischemic cardiomyopathy: Secondary | ICD-10-CM | POA: Diagnosis not present

## 2015-02-04 DIAGNOSIS — I5032 Chronic diastolic (congestive) heart failure: Secondary | ICD-10-CM | POA: Diagnosis not present

## 2015-02-04 NOTE — Telephone Encounter (Signed)
New message      Pt c/o BP issue: STAT if pt c/o blurred vision, one-sided weakness or slurred speech  1. What are your last 5 BP readings? 84/48 and 72/44 yesterday after taking isosorbide; today 122/66 by nurse with no medication 2. Are you having any other symptoms (ex. Dizziness, headache, blurred vision, passed out)? Yesterday---dizziness 3. What is your BP issue? Pt want to know if he should continue taking medication

## 2015-02-04 NOTE — Telephone Encounter (Signed)
[place] states pt's BP low 84/48 HR 107, recheck 72/44 yesterday morning when pt checked his BP- pt sat down, rested, drank some fluids and felt better. Delena Serve states pt started Levaquin 02/01/15 because of URI and may not have been drinking adequate fluids yesterday morning.  [place] states today BP 122/66 HR 88 today-pt did not valsartan, chlorthalidone, or isosorbide today.  Delena Serve states pt is staying well hydrated today.  Delena Serve states pt feeling better today, pt asking what to do about his medications. Delena Serve advised I will forward to Dr Tamala Julian for review.

## 2015-02-05 NOTE — Telephone Encounter (Signed)
Returned pt call. Pt sts that he was doing better today. Pt reports that his weight today was 198lb. Pt bp today was 116/62. Pt sts that he has liberalized fluid and feel better.  Pt sts that he has been holding Valsartan and Chlorthalidone.  Pt adv that Dr.Smith is not the office. Pt aware of Armanda Heritage recommendation Hold Valsartan until Thurs 9/1 Hold Chlorthalidone until Fri 9/2 Pt homehealth nurse will be out to his home tomorrow. He will have her call to report bp, weight, and an update.

## 2015-02-05 NOTE — Telephone Encounter (Signed)
Follow up    Pt c/o BP issue: STAT if pt c/o blurred vision, one-sided weakness or slurred speech  1. What are your last 5 BP readings? 72/44 Sunday am;  Today 116/62  2. Are you having any other symptoms (ex. Dizziness, headache, blurred vision, passed out)? Sunday morning he had a little dizziness  3. What is your BP issue? B/p is going up and down  Pt has lost weight

## 2015-02-06 DIAGNOSIS — I5032 Chronic diastolic (congestive) heart failure: Secondary | ICD-10-CM | POA: Diagnosis not present

## 2015-02-06 DIAGNOSIS — I255 Ischemic cardiomyopathy: Secondary | ICD-10-CM | POA: Diagnosis not present

## 2015-02-06 DIAGNOSIS — E119 Type 2 diabetes mellitus without complications: Secondary | ICD-10-CM | POA: Diagnosis not present

## 2015-02-06 DIAGNOSIS — I110 Hypertensive heart disease with heart failure: Secondary | ICD-10-CM | POA: Diagnosis not present

## 2015-02-06 DIAGNOSIS — Z48815 Encounter for surgical aftercare following surgery on the digestive system: Secondary | ICD-10-CM | POA: Diagnosis not present

## 2015-02-06 DIAGNOSIS — Z433 Encounter for attention to colostomy: Secondary | ICD-10-CM | POA: Diagnosis not present

## 2015-02-06 NOTE — Telephone Encounter (Signed)
Pt home health nurse Delena Serve aware of Armanda Heritage instructions below. She would like to know if Dr.ASmith would like to set bp perimeters for holding pt meds. Adv her I will fwd him a message and call back with his response.

## 2015-02-08 DIAGNOSIS — I5032 Chronic diastolic (congestive) heart failure: Secondary | ICD-10-CM | POA: Diagnosis not present

## 2015-02-08 DIAGNOSIS — I255 Ischemic cardiomyopathy: Secondary | ICD-10-CM | POA: Diagnosis not present

## 2015-02-08 DIAGNOSIS — I110 Hypertensive heart disease with heart failure: Secondary | ICD-10-CM | POA: Diagnosis not present

## 2015-02-08 DIAGNOSIS — Z433 Encounter for attention to colostomy: Secondary | ICD-10-CM | POA: Diagnosis not present

## 2015-02-08 DIAGNOSIS — Z48815 Encounter for surgical aftercare following surgery on the digestive system: Secondary | ICD-10-CM | POA: Diagnosis not present

## 2015-02-08 DIAGNOSIS — E119 Type 2 diabetes mellitus without complications: Secondary | ICD-10-CM | POA: Diagnosis not present

## 2015-02-09 DIAGNOSIS — I5032 Chronic diastolic (congestive) heart failure: Secondary | ICD-10-CM | POA: Diagnosis not present

## 2015-02-09 DIAGNOSIS — I255 Ischemic cardiomyopathy: Secondary | ICD-10-CM | POA: Diagnosis not present

## 2015-02-09 DIAGNOSIS — Z433 Encounter for attention to colostomy: Secondary | ICD-10-CM | POA: Diagnosis not present

## 2015-02-09 DIAGNOSIS — Z48815 Encounter for surgical aftercare following surgery on the digestive system: Secondary | ICD-10-CM | POA: Diagnosis not present

## 2015-02-09 DIAGNOSIS — I110 Hypertensive heart disease with heart failure: Secondary | ICD-10-CM | POA: Diagnosis not present

## 2015-02-09 DIAGNOSIS — E119 Type 2 diabetes mellitus without complications: Secondary | ICD-10-CM | POA: Diagnosis not present

## 2015-02-11 DIAGNOSIS — K589 Irritable bowel syndrome without diarrhea: Secondary | ICD-10-CM | POA: Diagnosis not present

## 2015-02-11 DIAGNOSIS — I739 Peripheral vascular disease, unspecified: Secondary | ICD-10-CM | POA: Diagnosis not present

## 2015-02-11 DIAGNOSIS — E669 Obesity, unspecified: Secondary | ICD-10-CM | POA: Diagnosis not present

## 2015-02-11 DIAGNOSIS — Z87891 Personal history of nicotine dependence: Secondary | ICD-10-CM | POA: Diagnosis not present

## 2015-02-11 DIAGNOSIS — J449 Chronic obstructive pulmonary disease, unspecified: Secondary | ICD-10-CM | POA: Diagnosis not present

## 2015-02-11 DIAGNOSIS — Z8701 Personal history of pneumonia (recurrent): Secondary | ICD-10-CM | POA: Diagnosis not present

## 2015-02-11 DIAGNOSIS — Z48815 Encounter for surgical aftercare following surgery on the digestive system: Secondary | ICD-10-CM | POA: Diagnosis not present

## 2015-02-11 DIAGNOSIS — E119 Type 2 diabetes mellitus without complications: Secondary | ICD-10-CM | POA: Diagnosis not present

## 2015-02-11 DIAGNOSIS — I255 Ischemic cardiomyopathy: Secondary | ICD-10-CM | POA: Diagnosis not present

## 2015-02-11 DIAGNOSIS — K219 Gastro-esophageal reflux disease without esophagitis: Secondary | ICD-10-CM | POA: Diagnosis not present

## 2015-02-11 DIAGNOSIS — I110 Hypertensive heart disease with heart failure: Secondary | ICD-10-CM | POA: Diagnosis not present

## 2015-02-11 DIAGNOSIS — I5032 Chronic diastolic (congestive) heart failure: Secondary | ICD-10-CM | POA: Diagnosis not present

## 2015-02-11 DIAGNOSIS — Z433 Encounter for attention to colostomy: Secondary | ICD-10-CM | POA: Diagnosis not present

## 2015-02-12 ENCOUNTER — Telehealth: Payer: Self-pay | Admitting: Internal Medicine

## 2015-02-12 DIAGNOSIS — I5032 Chronic diastolic (congestive) heart failure: Secondary | ICD-10-CM | POA: Diagnosis not present

## 2015-02-12 DIAGNOSIS — Z433 Encounter for attention to colostomy: Secondary | ICD-10-CM | POA: Diagnosis not present

## 2015-02-12 DIAGNOSIS — I255 Ischemic cardiomyopathy: Secondary | ICD-10-CM | POA: Diagnosis not present

## 2015-02-12 DIAGNOSIS — E119 Type 2 diabetes mellitus without complications: Secondary | ICD-10-CM | POA: Diagnosis not present

## 2015-02-12 DIAGNOSIS — Z48815 Encounter for surgical aftercare following surgery on the digestive system: Secondary | ICD-10-CM | POA: Diagnosis not present

## 2015-02-12 DIAGNOSIS — I110 Hypertensive heart disease with heart failure: Secondary | ICD-10-CM | POA: Diagnosis not present

## 2015-02-12 NOTE — Telephone Encounter (Signed)
Patient reports surgery in June for perforated diverticulitis.  He reports that at night he lays down at night he has no pain, but in the morning he has abdominal pain and soreness.  He still has a wound vac from the surgeries.  He wanted to know if he could have a bacterial infection in his stomach.  He is advised that pain is most likely musculoskeletal because is present in the am and improves through out the day.  He has a phone call into [facility] also about pain he is having from the surgery. He will call back for any additional questions or concerns.

## 2015-02-13 DIAGNOSIS — E119 Type 2 diabetes mellitus without complications: Secondary | ICD-10-CM | POA: Diagnosis not present

## 2015-02-13 DIAGNOSIS — I110 Hypertensive heart disease with heart failure: Secondary | ICD-10-CM | POA: Diagnosis not present

## 2015-02-13 DIAGNOSIS — I5032 Chronic diastolic (congestive) heart failure: Secondary | ICD-10-CM | POA: Diagnosis not present

## 2015-02-13 DIAGNOSIS — I255 Ischemic cardiomyopathy: Secondary | ICD-10-CM | POA: Diagnosis not present

## 2015-02-13 DIAGNOSIS — Z48815 Encounter for surgical aftercare following surgery on the digestive system: Secondary | ICD-10-CM | POA: Diagnosis not present

## 2015-02-13 DIAGNOSIS — Z433 Encounter for attention to colostomy: Secondary | ICD-10-CM | POA: Diagnosis not present

## 2015-02-15 DIAGNOSIS — Z433 Encounter for attention to colostomy: Secondary | ICD-10-CM | POA: Diagnosis not present

## 2015-02-15 DIAGNOSIS — I255 Ischemic cardiomyopathy: Secondary | ICD-10-CM | POA: Diagnosis not present

## 2015-02-15 DIAGNOSIS — I5032 Chronic diastolic (congestive) heart failure: Secondary | ICD-10-CM | POA: Diagnosis not present

## 2015-02-15 DIAGNOSIS — I110 Hypertensive heart disease with heart failure: Secondary | ICD-10-CM | POA: Diagnosis not present

## 2015-02-15 DIAGNOSIS — Z48815 Encounter for surgical aftercare following surgery on the digestive system: Secondary | ICD-10-CM | POA: Diagnosis not present

## 2015-02-15 DIAGNOSIS — E119 Type 2 diabetes mellitus without complications: Secondary | ICD-10-CM | POA: Diagnosis not present

## 2015-02-18 DIAGNOSIS — I255 Ischemic cardiomyopathy: Secondary | ICD-10-CM | POA: Diagnosis not present

## 2015-02-18 DIAGNOSIS — Z433 Encounter for attention to colostomy: Secondary | ICD-10-CM | POA: Diagnosis not present

## 2015-02-18 DIAGNOSIS — E119 Type 2 diabetes mellitus without complications: Secondary | ICD-10-CM | POA: Diagnosis not present

## 2015-02-18 DIAGNOSIS — I110 Hypertensive heart disease with heart failure: Secondary | ICD-10-CM | POA: Diagnosis not present

## 2015-02-18 DIAGNOSIS — I5032 Chronic diastolic (congestive) heart failure: Secondary | ICD-10-CM | POA: Diagnosis not present

## 2015-02-18 DIAGNOSIS — Z48815 Encounter for surgical aftercare following surgery on the digestive system: Secondary | ICD-10-CM | POA: Diagnosis not present

## 2015-02-20 ENCOUNTER — Telehealth: Payer: Self-pay | Admitting: Internal Medicine

## 2015-02-20 DIAGNOSIS — Z48815 Encounter for surgical aftercare following surgery on the digestive system: Secondary | ICD-10-CM | POA: Diagnosis not present

## 2015-02-20 DIAGNOSIS — I255 Ischemic cardiomyopathy: Secondary | ICD-10-CM | POA: Diagnosis not present

## 2015-02-20 DIAGNOSIS — E119 Type 2 diabetes mellitus without complications: Secondary | ICD-10-CM | POA: Diagnosis not present

## 2015-02-20 DIAGNOSIS — I110 Hypertensive heart disease with heart failure: Secondary | ICD-10-CM | POA: Diagnosis not present

## 2015-02-20 DIAGNOSIS — Z433 Encounter for attention to colostomy: Secondary | ICD-10-CM | POA: Diagnosis not present

## 2015-02-20 DIAGNOSIS — I5032 Chronic diastolic (congestive) heart failure: Secondary | ICD-10-CM | POA: Diagnosis not present

## 2015-02-20 NOTE — Telephone Encounter (Signed)
Called [facility] at number given Directed to a voicemail for a person name Eric [name] Left message to call back

## 2015-02-21 NOTE — Telephone Encounter (Signed)
lmomtcb x 2  

## 2015-02-21 NOTE — Telephone Encounter (Signed)
Left message to call back  

## 2015-02-21 NOTE — Telephone Encounter (Signed)
Sholanda from [facility] is returning call - [phone]

## 2015-02-22 DIAGNOSIS — I110 Hypertensive heart disease with heart failure: Secondary | ICD-10-CM | POA: Diagnosis not present

## 2015-02-22 DIAGNOSIS — I5032 Chronic diastolic (congestive) heart failure: Secondary | ICD-10-CM | POA: Diagnosis not present

## 2015-02-22 DIAGNOSIS — Z433 Encounter for attention to colostomy: Secondary | ICD-10-CM | POA: Diagnosis not present

## 2015-02-22 DIAGNOSIS — I255 Ischemic cardiomyopathy: Secondary | ICD-10-CM | POA: Diagnosis not present

## 2015-02-22 DIAGNOSIS — E119 Type 2 diabetes mellitus without complications: Secondary | ICD-10-CM | POA: Diagnosis not present

## 2015-02-22 DIAGNOSIS — Z48815 Encounter for surgical aftercare following surgery on the digestive system: Secondary | ICD-10-CM | POA: Diagnosis not present

## 2015-02-22 NOTE — Telephone Encounter (Signed)
Sholanda from [facility] called back. She is going to fax over a form for Dr. Annamaria Boots to sign for pt to get a POC. Will make Katie aware to look out for fax

## 2015-02-22 NOTE — Telephone Encounter (Signed)
Form has been faxed back to [employer] at [phone].

## 2015-02-22 NOTE — Telephone Encounter (Signed)
Form given to Katie.

## 2015-02-22 NOTE — Telephone Encounter (Signed)
Left message for Eric [name] to return call

## 2015-02-25 DIAGNOSIS — Z433 Encounter for attention to colostomy: Secondary | ICD-10-CM | POA: Diagnosis not present

## 2015-02-25 DIAGNOSIS — E119 Type 2 diabetes mellitus without complications: Secondary | ICD-10-CM | POA: Diagnosis not present

## 2015-02-25 DIAGNOSIS — I5032 Chronic diastolic (congestive) heart failure: Secondary | ICD-10-CM | POA: Diagnosis not present

## 2015-02-25 DIAGNOSIS — I255 Ischemic cardiomyopathy: Secondary | ICD-10-CM | POA: Diagnosis not present

## 2015-02-25 DIAGNOSIS — Z48815 Encounter for surgical aftercare following surgery on the digestive system: Secondary | ICD-10-CM | POA: Diagnosis not present

## 2015-02-25 DIAGNOSIS — I110 Hypertensive heart disease with heart failure: Secondary | ICD-10-CM | POA: Diagnosis not present

## 2015-02-26 ENCOUNTER — Telehealth: Payer: Self-pay | Admitting: Internal Medicine

## 2015-02-26 NOTE — Telephone Encounter (Signed)
Called and spoke to pt. Pt stated he does not want the [facility] system at this time d/t the cost. Pt states he wants to discuss the issue with Dr. Annamaria Boots at upcoming appt on 10.4.2016. Nothing further needed at this time.

## 2015-02-27 DIAGNOSIS — I110 Hypertensive heart disease with heart failure: Secondary | ICD-10-CM | POA: Diagnosis not present

## 2015-02-27 DIAGNOSIS — Z433 Encounter for attention to colostomy: Secondary | ICD-10-CM | POA: Diagnosis not present

## 2015-02-27 DIAGNOSIS — Z48815 Encounter for surgical aftercare following surgery on the digestive system: Secondary | ICD-10-CM | POA: Diagnosis not present

## 2015-02-27 DIAGNOSIS — I5032 Chronic diastolic (congestive) heart failure: Secondary | ICD-10-CM | POA: Diagnosis not present

## 2015-02-27 DIAGNOSIS — I255 Ischemic cardiomyopathy: Secondary | ICD-10-CM | POA: Diagnosis not present

## 2015-02-27 DIAGNOSIS — E119 Type 2 diabetes mellitus without complications: Secondary | ICD-10-CM | POA: Diagnosis not present

## 2015-03-01 ENCOUNTER — Encounter: Payer: Self-pay | Admitting: Internal Medicine

## 2015-03-01 DIAGNOSIS — Z433 Encounter for attention to colostomy: Secondary | ICD-10-CM | POA: Diagnosis not present

## 2015-03-01 DIAGNOSIS — I255 Ischemic cardiomyopathy: Secondary | ICD-10-CM | POA: Diagnosis not present

## 2015-03-01 DIAGNOSIS — I5032 Chronic diastolic (congestive) heart failure: Secondary | ICD-10-CM | POA: Diagnosis not present

## 2015-03-01 DIAGNOSIS — E119 Type 2 diabetes mellitus without complications: Secondary | ICD-10-CM | POA: Diagnosis not present

## 2015-03-01 DIAGNOSIS — I110 Hypertensive heart disease with heart failure: Secondary | ICD-10-CM | POA: Diagnosis not present

## 2015-03-01 DIAGNOSIS — Z48815 Encounter for surgical aftercare following surgery on the digestive system: Secondary | ICD-10-CM | POA: Diagnosis not present

## 2015-03-04 DIAGNOSIS — Z933 Colostomy status: Secondary | ICD-10-CM | POA: Diagnosis not present

## 2015-03-04 DIAGNOSIS — K5720 Diverticulitis of large intestine with perforation and abscess without bleeding: Secondary | ICD-10-CM | POA: Diagnosis not present

## 2015-03-05 DIAGNOSIS — E119 Type 2 diabetes mellitus without complications: Secondary | ICD-10-CM | POA: Diagnosis not present

## 2015-03-05 DIAGNOSIS — I5032 Chronic diastolic (congestive) heart failure: Secondary | ICD-10-CM | POA: Diagnosis not present

## 2015-03-05 DIAGNOSIS — I110 Hypertensive heart disease with heart failure: Secondary | ICD-10-CM | POA: Diagnosis not present

## 2015-03-05 DIAGNOSIS — Z48815 Encounter for surgical aftercare following surgery on the digestive system: Secondary | ICD-10-CM | POA: Diagnosis not present

## 2015-03-05 DIAGNOSIS — Z433 Encounter for attention to colostomy: Secondary | ICD-10-CM | POA: Diagnosis not present

## 2015-03-05 DIAGNOSIS — I255 Ischemic cardiomyopathy: Secondary | ICD-10-CM | POA: Diagnosis not present

## 2015-03-06 DIAGNOSIS — Z433 Encounter for attention to colostomy: Secondary | ICD-10-CM | POA: Diagnosis not present

## 2015-03-06 DIAGNOSIS — E119 Type 2 diabetes mellitus without complications: Secondary | ICD-10-CM | POA: Diagnosis not present

## 2015-03-06 DIAGNOSIS — Z48815 Encounter for surgical aftercare following surgery on the digestive system: Secondary | ICD-10-CM | POA: Diagnosis not present

## 2015-03-06 DIAGNOSIS — I5032 Chronic diastolic (congestive) heart failure: Secondary | ICD-10-CM | POA: Diagnosis not present

## 2015-03-06 DIAGNOSIS — I110 Hypertensive heart disease with heart failure: Secondary | ICD-10-CM | POA: Diagnosis not present

## 2015-03-06 DIAGNOSIS — I255 Ischemic cardiomyopathy: Secondary | ICD-10-CM | POA: Diagnosis not present

## 2015-03-07 DIAGNOSIS — I5032 Chronic diastolic (congestive) heart failure: Secondary | ICD-10-CM | POA: Diagnosis not present

## 2015-03-07 DIAGNOSIS — Z433 Encounter for attention to colostomy: Secondary | ICD-10-CM | POA: Diagnosis not present

## 2015-03-07 DIAGNOSIS — I255 Ischemic cardiomyopathy: Secondary | ICD-10-CM | POA: Diagnosis not present

## 2015-03-07 DIAGNOSIS — I110 Hypertensive heart disease with heart failure: Secondary | ICD-10-CM | POA: Diagnosis not present

## 2015-03-07 DIAGNOSIS — Z48815 Encounter for surgical aftercare following surgery on the digestive system: Secondary | ICD-10-CM | POA: Diagnosis not present

## 2015-03-07 DIAGNOSIS — E119 Type 2 diabetes mellitus without complications: Secondary | ICD-10-CM | POA: Diagnosis not present

## 2015-03-08 DIAGNOSIS — E119 Type 2 diabetes mellitus without complications: Secondary | ICD-10-CM | POA: Diagnosis not present

## 2015-03-08 DIAGNOSIS — Z48815 Encounter for surgical aftercare following surgery on the digestive system: Secondary | ICD-10-CM | POA: Diagnosis not present

## 2015-03-08 DIAGNOSIS — I110 Hypertensive heart disease with heart failure: Secondary | ICD-10-CM | POA: Diagnosis not present

## 2015-03-08 DIAGNOSIS — M7542 Impingement syndrome of left shoulder: Secondary | ICD-10-CM | POA: Diagnosis not present

## 2015-03-08 DIAGNOSIS — Z433 Encounter for attention to colostomy: Secondary | ICD-10-CM | POA: Diagnosis not present

## 2015-03-08 DIAGNOSIS — I5032 Chronic diastolic (congestive) heart failure: Secondary | ICD-10-CM | POA: Diagnosis not present

## 2015-03-08 DIAGNOSIS — I255 Ischemic cardiomyopathy: Secondary | ICD-10-CM | POA: Diagnosis not present

## 2015-03-09 DIAGNOSIS — I110 Hypertensive heart disease with heart failure: Secondary | ICD-10-CM | POA: Diagnosis not present

## 2015-03-09 DIAGNOSIS — Z48815 Encounter for surgical aftercare following surgery on the digestive system: Secondary | ICD-10-CM | POA: Diagnosis not present

## 2015-03-09 DIAGNOSIS — I255 Ischemic cardiomyopathy: Secondary | ICD-10-CM | POA: Diagnosis not present

## 2015-03-09 DIAGNOSIS — Z433 Encounter for attention to colostomy: Secondary | ICD-10-CM | POA: Diagnosis not present

## 2015-03-09 DIAGNOSIS — E119 Type 2 diabetes mellitus without complications: Secondary | ICD-10-CM | POA: Diagnosis not present

## 2015-03-09 DIAGNOSIS — I5032 Chronic diastolic (congestive) heart failure: Secondary | ICD-10-CM | POA: Diagnosis not present

## 2015-03-11 DIAGNOSIS — Z48815 Encounter for surgical aftercare following surgery on the digestive system: Secondary | ICD-10-CM | POA: Diagnosis not present

## 2015-03-11 DIAGNOSIS — I255 Ischemic cardiomyopathy: Secondary | ICD-10-CM | POA: Diagnosis not present

## 2015-03-11 DIAGNOSIS — I5032 Chronic diastolic (congestive) heart failure: Secondary | ICD-10-CM | POA: Diagnosis not present

## 2015-03-11 DIAGNOSIS — I110 Hypertensive heart disease with heart failure: Secondary | ICD-10-CM | POA: Diagnosis not present

## 2015-03-11 DIAGNOSIS — Z433 Encounter for attention to colostomy: Secondary | ICD-10-CM | POA: Diagnosis not present

## 2015-03-11 DIAGNOSIS — E119 Type 2 diabetes mellitus without complications: Secondary | ICD-10-CM | POA: Diagnosis not present

## 2015-03-12 ENCOUNTER — Encounter: Payer: Self-pay | Admitting: Internal Medicine

## 2015-03-12 ENCOUNTER — Ambulatory Visit (INDEPENDENT_AMBULATORY_CARE_PROVIDER_SITE_OTHER): Payer: Medicare Other | Admitting: Internal Medicine

## 2015-03-12 VITALS — BP 128/60 | HR 90 | Ht 66.0 in | Wt 201.8 lb

## 2015-03-12 DIAGNOSIS — I25810 Atherosclerosis of coronary artery bypass graft(s) without angina pectoris: Secondary | ICD-10-CM | POA: Diagnosis not present

## 2015-03-12 DIAGNOSIS — J449 Chronic obstructive pulmonary disease, unspecified: Secondary | ICD-10-CM

## 2015-03-12 DIAGNOSIS — G4733 Obstructive sleep apnea (adult) (pediatric): Secondary | ICD-10-CM | POA: Diagnosis not present

## 2015-03-12 DIAGNOSIS — Z23 Encounter for immunization: Secondary | ICD-10-CM

## 2015-03-12 NOTE — Assessment & Plan Note (Signed)
As he has remained off of cigarettes, chronic bronchitis component has resolved. Low diffusion capacity may be from his heart or a component of emphysema.

## 2015-03-12 NOTE — Assessment & Plan Note (Signed)
He continues CPAP 12/Advanced with supplemental oxygen 2 L at night. Says he can't sleep without oxygen and CPAP. He has lost some weight since sigmoid colectomy and this may help him.

## 2015-03-12 NOTE — Progress Notes (Signed)
04/10/11- 81 year old male former smoker, Veteran, who had been a patient of mine in the old [facility]. He is coming to reestablish after moving back from [place] to [place]. History of asthma, COPD, obstructive sleep apnea complicated by DM, obesity, CAD/ischemic CM/sick sinus/bradycardia tachycardia with pacemaker, history of prostate cancer, anemia. He reports feeling stable today. New carpet smell in his apartment is irritating to his breathing but he denies routine cough or recent bronchitis. He has not felt the need to use his bronchodilators in 4 months. Walking distances limited more by degenerative joint hip pain. He can walk a maximum of 10 minutes on level ground and very little on hills or stairs. He denies blood, adenopathy, discolored sputum or swelling. He usually has trace ankle edema with no history of venous thromboembolic disease. CAD treated with CABG, stent and pacemaker but he has not had infarction. Did have pneumonia at age 62. He declines flu vaccine. Has had pneumonia vaccine. Long-term diagnosis of sleep apnea. NPSG 01/27/06-AHI 59.9 per hour. CPAP 12 plus oxygen 2 L for sleep, with good compliance and control. He will want to change to a local provider.  07/14/11- 81 year old male former smoker, Veteran,  History of asthma, COPD, obstructive sleep apnea complicated by DM, obesity, CAD/ischemic CM/sick sinus/bradycardia tachycardia with pacemaker, history of prostate cancer, anemia He had his first flu vaccine in 10 years this winter and avoided any significant respiratory infection. He no longer thinks he needs daytime oxygen. He is wearing it only at night, with his CPAP. Scheduled for right hip replacement in March. Chest x-ray 04/15/2011-no acute process, pacemaker PFT 04/23/2011-mild COPD  08/14/11-  81 year old male former smoker, Veteran,  History of asthma, COPD, obstructive sleep apnea complicated by DM, obesity, CAD/ischemic CM/sick sinus/bradycardia tachycardia/ pacemaker,  history of prostate cancer, anemia He is pending hip replacement by Dr. Tonita Cong on March 15. Asks prior authorization. I explained his increased risk of cardiopulmonary complications from any surgery and recognition that he is probably stable and near his best function now for necessary surgery. He is trying to move from his current apartment to a smoke free facility. A neighbor smokes heavily and the tobacco odor comes into the air conditioning ducts. He continues good compliance and symptom control using CPAP 12 with O2 2 L every night. He will use those settings while he is at the hospital for sleep and in recovery after extubation.  10/26/11- 81 year old male former smoker, Veteran,  History of asthma, COPD, obstructive sleep apnea complicated by DM, obesity, CAD/ischemic CM/sick sinus/bradycardia tachycardia/ pacemaker, history of prostate cancer, anemia Pt states having a productive cough  yellowish brown ,,increase sob wheezing ,chest congestion, cold chills .  Had right total hip replacement and finished physical therapy. Pain still limits walking. No respiratory complications. Now for 3 days had increased cough, yellow sputum, shortness of breath, wheeze, cold chills. Continued reflux symptoms on Nexium once daily. Discussion of steroid therapy and side effects.  11/05/11- 81 year old male former smoker, Veteran,  History of asthma, COPD, obstructive sleep apnea complicated by DM, obesity, CAD/ischemic CM/sick sinus/bradycardia tachycardia/ pacemaker, history of prostate cancer, anemia ACUTE VISIT: SOB and cough-productive yellow in color.Would like Zpak. Seen 10 days ago and Depo-Medrol and Z-Pak did help at that visit. Comfortable with CPAP. He takes it off to cough.  01/04/12- 81 year old male former smoker, Veteran,  History of asthma, COPD, obstructive sleep apnea complicated by DM, obesity, CAD/ischemic CM/sick sinus/bradycardia tachycardia/ pacemaker, history of prostate cancer,  anemia. Needs recert for O2 at night per  [facility]; pt states he feels like he needs O2 during the day as well. He says he sleeps and feels better when  using oxygen. Continue CPAP 12/Advanced, with oxygen at 2 L currently. COPD assessment test (CAT) 31/40.  03/07/12- 81 year old male former smoker, Veteran,  History of asthma, COPD, obstructive sleep apnea complicated by DM, obesity, CAD/ischemic CM/sick sinus/bradycardia tachycardia/ pacemaker, history of prostate cancer, anemia. Has good and bad days; able to move through the home with O2 (3L) on. Has had flu vaccine He qualified for home oxygen and uses it 90% of the time. Needs to wear it to clean his apartment. CPAP 12 "can't sleep without it". Walks laps in parking lot for exercise.  09/13/12- 89-year-old male former smoker, Veteran,  History of asthma, COPD, obstructive sleep apnea complicated by DM, obesity, CAD/ischemic CM/sick sinus/bradycardia tachycardia/ pacemaker, history of prostate cancer, anemia. Follows For:  Requalify for O2 for [facility] -  Pt doesnt feel like he needs O2 - SOB with increased housework activity - PT had fall 2 weeks ago and hurt back - Scheduled for CT this week Isn't sure he needs portable oxygen but does have concentrator in his house for sleep and as needed. He can't sleep without CPAP 12/ APS all night, every night, O2 2L/ Advanced. CXR 11/05/11 IMPRESSION:  Bibasilar bronchiectasis with new associated nodular air space  disease, indicative of an infectious bronchiolitis or  bronchopneumonia.  Original Report Authenticated By: Luretha Rued, M.D.  03/16/13-  81 year old male former smoker, Veteran,  History of asthma, COPD, obstructive sleep apnea complicated by DM, obesity, CAD/ischemic CM/sick sinus/bradycardia tachycardia/ pacemaker, history of prostate cancer, anemia. CPAP 12 / O2 2L/ APS sleep. Would like not to need O2 for exertion.  needs o2 recertified.  Breathing is unchanged.  Wearing cpap every night.  No  problems with mask or pressure.   Dizzy if he stands quickly and questions blood pressure medicines. Sees Dr Asa Lente tomorrow. CXR 02/02/13 IMPRESSION:  No acute abnormalities.  Original Report Authenticated By: Lorriane Shire, M.D. Exercise oximetry 03/16/13- rest RA 93%, walking RA 87%, Walking 2L O2 98%.   09/14/13-  81 year old male former smoker, Veteran,  History of asthma, COPD, obstructive sleep apnea complicated by DM, obesity, CAD/ischemic CM/sick sinus/bradycardia tachycardia/ pacemaker, history of prostate cancer, anemia. CPAP 12 / O2 2L/ Advanced sleep. FOLLOWS FOR: back pain continues to make it hard to breath. Otherwise continues to wear O2 during day on pulsing and O2 concentrator at night. Desat on room air at rest today to 88%. CXR 07/17/13 IMPRESSION:  Sequential pacemaker in place. Post CABG. Cardiomegaly.  Calcified tortuous aorta.  Mild central pulmonary vascular prominence stable.  Basilar scarring/subsegmental atelectasis without segmental  consolidation.  Electronically Signed  By: Chauncey Cruel M.D.  On: 07/17/2013 16:32  11/07/13- 81 year old male former smoker, Veteran,  History of asthma, COPD, obstructive sleep apnea complicated by DM, obesity, CAD/ischemic CM/sick sinus/bradycardia tachycardia/ pacemaker, history of prostate cancer, anemia. CPAP 12 / O2 2L/ Advanced sleep. Dyspnea on exertion with pulse oxygen, okay at rest. He worries about being allergic to bushes around his home because he has a "bitter taste" in his mouth. Neighbor upstairs smokes and the odor bothers patient. Continues CPAP all night every night and believes it helps him Walk test on oxygen- 11/07/13- 3 laps x 185 feet, lowest sat 91 % on 2L pulsed O2  01/18/14- 81 year old male former smoker, Veteran,  History of asthma, COPD, obstructive sleep apnea complicated by DM, obesity, CAD/ischemic CM/sick sinus/bradycardia tachycardia/ pacemaker,  history of prostate cancer, anemia. CPAP 12 / O2 2L/  Advanced sleep. FOLLOWS FOR:  acute visit today.  sore throat x 2-3 weeks.  pt stated that when he uses his inhalers he is having burning in his chest and wheezing at times with the inhaler use.   05/09/14-81 year old male former smoker, Veteran,  History of asthma, COPD, obstructive sleep apnea complicated by DM, obesity, CAD/ischemic CM/sick sinus/bradycardia tachycardia/ pacemaker, history of prostate cancer, anemia.  CPAP 12 / O2 2L/ Advanced sleep and exertion FOLLOWS FOR: been to UC and given Doxycycline-completed and no better. Continues to have cough-yellow in color with chest congestion and wheezing. He liked [employer] but questions insurance coverage. Has had worse bronchitis for 3 months-cough with yellow mucus, wheeze, chest congestion. Comes and goes. No better after doxycycline. Also asks small portable oxygen concentrator instead of the portable oxygen system he is now using. CXR 02/09/14 IMPRESSION: Stable chronic findings with no change from 07/17/2013. Electronically Signed  By: Skipper Cliche M.D.  On: 02/09/2014 10:01  07/26/14- 81 year old male former smoker, Veteran,  History of asthma, COPD, obstructive sleep apnea complicated by DM, obesity, CAD/ischemic CM/sick sinus/bradycardia tachycardia/ pacemaker, history of prostate cancer, anemia. CPAP 12 / O2 2L/ Advanced sleep and exertion. FOLLOWS FOR: Continues O2; Pt states he is coughing up yellow and thick phelgm since last summer.  Has been using Robtussion DM and Mucinex; has hard time taling due to congestion. He was treated with Augmentin December 12 and doxycycline January 21. History of rash with Cipro. He does not recognize reflux or choking on food.  09/10/14- 81 year old male former smoker, Veteran,  History of asthma, COPD, obstructive sleep apnea complicated by DM, obesity, CAD/ischemic CM/sick sinus/bradycardia tachycardia/ pacemaker, history of prostate cancer, anemia.  CPAP 12 / O2 2L/ Advanced sleep and  exertion. FOLLOWS FOR: Pt states that breathing has been doing well. Using O2 @ 2L/min. Pt states that pollen affects his SOB some.  . Uses CPAP all night every night. Sometimes skips oxygen if he goes out shopping. Little cough or wheeze. He feels he is doing very well now. CXR 07/27/14- IMPRESSION: No edema or consolidation. Mild bibasilar scarring Electronically Signed  By: Lowella Grip III M.D.  On: 07/27/2014 08:55  03/12/15-  80 year old male former smoker, Veteran,  History of asthma, COPD, obstructive sleep apnea complicated by DM, obesity, CAD/ischemic CM/sick sinus/bradycardia tachycardia/ pacemaker, history of prostate cancer, anemia.  CPAP 12 / O2 2L/ Advanced sleep and exertion. FOLLOWS FOR: Pt states he coughs occasionally-productive-white in color. No respiratory complications during sigmoid colectomy for diverticulitis with permanent left colostomy earlier this year. Did have a postoperative pneumonia at the nursing home. CXR 12/10/14 IMPRESSION: Mild cardiomegaly. Chronic mild pulmonary venous hypertension without overt edema. Scarring in the lower lobes. No acute cardiopulmonary disease. Stable examination. Electronically Signed  By: Evangeline Dakin M.D.  On: 12/10/2014 12:21   ROS-see HPI Constitutional:   No-   weight loss, night sweats, fevers, chills, fatigue, lassitude. HEENT:   No-  headaches, difficulty swallowing, tooth/dental problems, sore throat,       No-  sneezing, itching, ear ache, nasal congestion, post nasal drip,  CV:  No- recent  chest pain, orthopnea, PND. +Mild chronic swelling in lower extremities,                                              No-  anasarca, dizziness, palpitations Resp: +  shortness of breath with exertion or at rest.               productive cough,   non-productive cough,  No- coughing up of blood.               change in color of mucus.  No- wheezing.   Skin: No-   rash or lesions. GI:  No-   heartburn, indigestion,  abdominal pain, nausea, vomiting,  GU:  MS: . Limiting hip pain Neuro-     nothing unusual Psych:  No- change in mood or affect. No depression or anxiety.  No memory loss.  OBJ General- Alert, Oriented, Affect-appropriate, Distress- none acute, overweight, O2 2L Skin- rash-none, lesions- none, excoriation- none Lymphadenopathy- none Head- atraumatic            Eyes- Gross vision intact, PERRLA, conjunctivae clear secretions            Ears- Hearing aids, HOH            Nose- Clear, no-Septal dev, mucus, polyps, erosion, perforation             Throat- Mallampati II , mucosa clear , drainage- none, tonsils- atrophic, +dentures                     Neck- flexible , trachea midline, no stridor , thyroid nl, carotid no bruit Chest - symmetrical excursion , unlabored           Heart/CV- RRR +bigeminal pulse , no murmur , no gallop  , no rub, nl s1 s2                           - JVD- none , edema- none, stasis changes- none, varices- none           Lung- +few rhonchi, unlabored, wheeze- none, cough + harsh/ deep,   dullness-none, rub-  none. Raspy .           Chest wall- + pacemaker left chest Abd- Br/ Gen/ Rectal- Not done, not indicated Extrem- cyanosis- none, clubbing, none, atrophy- none, strength- deconditioned, +cane Neuro- grossly intact to observation

## 2015-03-12 NOTE — Patient Instructions (Signed)
Flu vax  Please call if we can help

## 2015-03-13 DIAGNOSIS — Z48815 Encounter for surgical aftercare following surgery on the digestive system: Secondary | ICD-10-CM | POA: Diagnosis not present

## 2015-03-13 DIAGNOSIS — Z433 Encounter for attention to colostomy: Secondary | ICD-10-CM | POA: Diagnosis not present

## 2015-03-13 DIAGNOSIS — I255 Ischemic cardiomyopathy: Secondary | ICD-10-CM | POA: Diagnosis not present

## 2015-03-13 DIAGNOSIS — I110 Hypertensive heart disease with heart failure: Secondary | ICD-10-CM | POA: Diagnosis not present

## 2015-03-13 DIAGNOSIS — E119 Type 2 diabetes mellitus without complications: Secondary | ICD-10-CM | POA: Diagnosis not present

## 2015-03-13 DIAGNOSIS — I5032 Chronic diastolic (congestive) heart failure: Secondary | ICD-10-CM | POA: Diagnosis not present

## 2015-03-15 DIAGNOSIS — Z433 Encounter for attention to colostomy: Secondary | ICD-10-CM | POA: Diagnosis not present

## 2015-03-15 DIAGNOSIS — I5032 Chronic diastolic (congestive) heart failure: Secondary | ICD-10-CM | POA: Diagnosis not present

## 2015-03-15 DIAGNOSIS — I110 Hypertensive heart disease with heart failure: Secondary | ICD-10-CM | POA: Diagnosis not present

## 2015-03-15 DIAGNOSIS — Z48815 Encounter for surgical aftercare following surgery on the digestive system: Secondary | ICD-10-CM | POA: Diagnosis not present

## 2015-03-15 DIAGNOSIS — E119 Type 2 diabetes mellitus without complications: Secondary | ICD-10-CM | POA: Diagnosis not present

## 2015-03-15 DIAGNOSIS — I255 Ischemic cardiomyopathy: Secondary | ICD-10-CM | POA: Diagnosis not present

## 2015-03-18 DIAGNOSIS — I5032 Chronic diastolic (congestive) heart failure: Secondary | ICD-10-CM | POA: Diagnosis not present

## 2015-03-18 DIAGNOSIS — I255 Ischemic cardiomyopathy: Secondary | ICD-10-CM | POA: Diagnosis not present

## 2015-03-18 DIAGNOSIS — Z48815 Encounter for surgical aftercare following surgery on the digestive system: Secondary | ICD-10-CM | POA: Diagnosis not present

## 2015-03-18 DIAGNOSIS — I110 Hypertensive heart disease with heart failure: Secondary | ICD-10-CM | POA: Diagnosis not present

## 2015-03-18 DIAGNOSIS — Z433 Encounter for attention to colostomy: Secondary | ICD-10-CM | POA: Diagnosis not present

## 2015-03-18 DIAGNOSIS — E119 Type 2 diabetes mellitus without complications: Secondary | ICD-10-CM | POA: Diagnosis not present

## 2015-03-20 DIAGNOSIS — I255 Ischemic cardiomyopathy: Secondary | ICD-10-CM | POA: Diagnosis not present

## 2015-03-20 DIAGNOSIS — E119 Type 2 diabetes mellitus without complications: Secondary | ICD-10-CM | POA: Diagnosis not present

## 2015-03-20 DIAGNOSIS — I110 Hypertensive heart disease with heart failure: Secondary | ICD-10-CM | POA: Diagnosis not present

## 2015-03-20 DIAGNOSIS — Z433 Encounter for attention to colostomy: Secondary | ICD-10-CM | POA: Diagnosis not present

## 2015-03-20 DIAGNOSIS — I5032 Chronic diastolic (congestive) heart failure: Secondary | ICD-10-CM | POA: Diagnosis not present

## 2015-03-20 DIAGNOSIS — Z48815 Encounter for surgical aftercare following surgery on the digestive system: Secondary | ICD-10-CM | POA: Diagnosis not present

## 2015-03-22 DIAGNOSIS — Z433 Encounter for attention to colostomy: Secondary | ICD-10-CM | POA: Diagnosis not present

## 2015-03-22 DIAGNOSIS — I255 Ischemic cardiomyopathy: Secondary | ICD-10-CM | POA: Diagnosis not present

## 2015-03-22 DIAGNOSIS — E119 Type 2 diabetes mellitus without complications: Secondary | ICD-10-CM | POA: Diagnosis not present

## 2015-03-22 DIAGNOSIS — I5032 Chronic diastolic (congestive) heart failure: Secondary | ICD-10-CM | POA: Diagnosis not present

## 2015-03-22 DIAGNOSIS — Z48815 Encounter for surgical aftercare following surgery on the digestive system: Secondary | ICD-10-CM | POA: Diagnosis not present

## 2015-03-22 DIAGNOSIS — I110 Hypertensive heart disease with heart failure: Secondary | ICD-10-CM | POA: Diagnosis not present

## 2015-03-25 DIAGNOSIS — Z48815 Encounter for surgical aftercare following surgery on the digestive system: Secondary | ICD-10-CM | POA: Diagnosis not present

## 2015-03-25 DIAGNOSIS — I255 Ischemic cardiomyopathy: Secondary | ICD-10-CM | POA: Diagnosis not present

## 2015-03-25 DIAGNOSIS — Z433 Encounter for attention to colostomy: Secondary | ICD-10-CM | POA: Diagnosis not present

## 2015-03-25 DIAGNOSIS — E119 Type 2 diabetes mellitus without complications: Secondary | ICD-10-CM | POA: Diagnosis not present

## 2015-03-25 DIAGNOSIS — I5032 Chronic diastolic (congestive) heart failure: Secondary | ICD-10-CM | POA: Diagnosis not present

## 2015-03-25 DIAGNOSIS — I110 Hypertensive heart disease with heart failure: Secondary | ICD-10-CM | POA: Diagnosis not present

## 2015-03-27 DIAGNOSIS — E119 Type 2 diabetes mellitus without complications: Secondary | ICD-10-CM | POA: Diagnosis not present

## 2015-03-27 DIAGNOSIS — Z48815 Encounter for surgical aftercare following surgery on the digestive system: Secondary | ICD-10-CM | POA: Diagnosis not present

## 2015-03-27 DIAGNOSIS — Z433 Encounter for attention to colostomy: Secondary | ICD-10-CM | POA: Diagnosis not present

## 2015-03-27 DIAGNOSIS — I255 Ischemic cardiomyopathy: Secondary | ICD-10-CM | POA: Diagnosis not present

## 2015-03-27 DIAGNOSIS — I5032 Chronic diastolic (congestive) heart failure: Secondary | ICD-10-CM | POA: Diagnosis not present

## 2015-03-27 DIAGNOSIS — I110 Hypertensive heart disease with heart failure: Secondary | ICD-10-CM | POA: Diagnosis not present

## 2015-03-28 DIAGNOSIS — M47816 Spondylosis without myelopathy or radiculopathy, lumbar region: Secondary | ICD-10-CM | POA: Diagnosis not present

## 2015-03-28 DIAGNOSIS — M545 Low back pain: Secondary | ICD-10-CM | POA: Diagnosis not present

## 2015-03-28 DIAGNOSIS — M5136 Other intervertebral disc degeneration, lumbar region: Secondary | ICD-10-CM | POA: Diagnosis not present

## 2015-03-29 DIAGNOSIS — I110 Hypertensive heart disease with heart failure: Secondary | ICD-10-CM | POA: Diagnosis not present

## 2015-03-29 DIAGNOSIS — I5032 Chronic diastolic (congestive) heart failure: Secondary | ICD-10-CM | POA: Diagnosis not present

## 2015-03-29 DIAGNOSIS — Z48815 Encounter for surgical aftercare following surgery on the digestive system: Secondary | ICD-10-CM | POA: Diagnosis not present

## 2015-03-29 DIAGNOSIS — I255 Ischemic cardiomyopathy: Secondary | ICD-10-CM | POA: Diagnosis not present

## 2015-03-29 DIAGNOSIS — E119 Type 2 diabetes mellitus without complications: Secondary | ICD-10-CM | POA: Diagnosis not present

## 2015-03-29 DIAGNOSIS — Z433 Encounter for attention to colostomy: Secondary | ICD-10-CM | POA: Diagnosis not present

## 2015-04-01 DIAGNOSIS — I5032 Chronic diastolic (congestive) heart failure: Secondary | ICD-10-CM | POA: Diagnosis not present

## 2015-04-01 DIAGNOSIS — Z433 Encounter for attention to colostomy: Secondary | ICD-10-CM | POA: Diagnosis not present

## 2015-04-01 DIAGNOSIS — E119 Type 2 diabetes mellitus without complications: Secondary | ICD-10-CM | POA: Diagnosis not present

## 2015-04-01 DIAGNOSIS — Z48815 Encounter for surgical aftercare following surgery on the digestive system: Secondary | ICD-10-CM | POA: Diagnosis not present

## 2015-04-01 DIAGNOSIS — I110 Hypertensive heart disease with heart failure: Secondary | ICD-10-CM | POA: Diagnosis not present

## 2015-04-01 DIAGNOSIS — I255 Ischemic cardiomyopathy: Secondary | ICD-10-CM | POA: Diagnosis not present

## 2015-04-02 ENCOUNTER — Telehealth: Payer: Self-pay | Admitting: Internal Medicine

## 2015-04-02 NOTE — Telephone Encounter (Signed)
States he seen Dr. Sharlet Salina on his last ov and she prescribed him an antibiotic.  Patient states this did not work and states that Dr. Sharlet Salina told him to call back if it did not and she would send him in a different antibiotic.  Patient states he has congestion and has thick yellow mucus coming up.  Patient states he uses [facility] at [place].

## 2015-04-02 NOTE — Telephone Encounter (Signed)
He was last seen in August when the antibiotic was sent in. Would need visit if still having symptoms for evaluation.

## 2015-04-03 DIAGNOSIS — Z433 Encounter for attention to colostomy: Secondary | ICD-10-CM | POA: Diagnosis not present

## 2015-04-03 DIAGNOSIS — Z933 Colostomy status: Secondary | ICD-10-CM | POA: Diagnosis not present

## 2015-04-03 DIAGNOSIS — K5720 Diverticulitis of large intestine with perforation and abscess without bleeding: Secondary | ICD-10-CM | POA: Diagnosis not present

## 2015-04-03 DIAGNOSIS — Z48815 Encounter for surgical aftercare following surgery on the digestive system: Secondary | ICD-10-CM | POA: Diagnosis not present

## 2015-04-03 DIAGNOSIS — I110 Hypertensive heart disease with heart failure: Secondary | ICD-10-CM | POA: Diagnosis not present

## 2015-04-03 DIAGNOSIS — I5032 Chronic diastolic (congestive) heart failure: Secondary | ICD-10-CM | POA: Diagnosis not present

## 2015-04-03 DIAGNOSIS — I255 Ischemic cardiomyopathy: Secondary | ICD-10-CM | POA: Diagnosis not present

## 2015-04-03 DIAGNOSIS — E119 Type 2 diabetes mellitus without complications: Secondary | ICD-10-CM | POA: Diagnosis not present

## 2015-04-03 NOTE — Telephone Encounter (Signed)
Left patient vm with MD response. °

## 2015-04-04 ENCOUNTER — Encounter: Payer: Self-pay | Admitting: Internal Medicine

## 2015-04-04 ENCOUNTER — Ambulatory Visit (INDEPENDENT_AMBULATORY_CARE_PROVIDER_SITE_OTHER): Payer: Medicare Other | Admitting: Internal Medicine

## 2015-04-04 ENCOUNTER — Ambulatory Visit (INDEPENDENT_AMBULATORY_CARE_PROVIDER_SITE_OTHER)
Admission: RE | Admit: 2015-04-04 | Discharge: 2015-04-04 | Disposition: A | Discharge status: Home / Self Care | Payer: Medicare Other | Source: Ambulatory Visit | Attending: Internal Medicine | Admitting: Internal Medicine

## 2015-04-04 VITALS — BP 132/62 | HR 68 | Temp 98.0°F | Resp 18 | Ht 66.0 in | Wt 210.0 lb

## 2015-04-04 DIAGNOSIS — I25810 Atherosclerosis of coronary artery bypass graft(s) without angina pectoris: Secondary | ICD-10-CM

## 2015-04-04 DIAGNOSIS — R059 Cough, unspecified: Secondary | ICD-10-CM

## 2015-04-04 DIAGNOSIS — R05 Cough: Secondary | ICD-10-CM | POA: Diagnosis not present

## 2015-04-04 MED ORDER — METHYLPREDNISOLONE ACETATE 40 MG/ML IJ SUSP
40.0000 mg | Freq: Once | INTRAMUSCULAR | Status: AC
  Administered 2015-04-04: 40 mg via INTRAMUSCULAR

## 2015-04-04 NOTE — Patient Instructions (Signed)
We have given you a steroid shot and are checking the chest x-ray. We will call you back and let you know what that showed.

## 2015-04-04 NOTE — Progress Notes (Signed)
Pre visit review using our clinic review tool, if applicable. No additional management support is needed unless otherwise documented below in the visit note. 

## 2015-04-05 DIAGNOSIS — E119 Type 2 diabetes mellitus without complications: Secondary | ICD-10-CM | POA: Diagnosis not present

## 2015-04-05 DIAGNOSIS — Z433 Encounter for attention to colostomy: Secondary | ICD-10-CM | POA: Diagnosis not present

## 2015-04-05 DIAGNOSIS — I110 Hypertensive heart disease with heart failure: Secondary | ICD-10-CM | POA: Diagnosis not present

## 2015-04-05 DIAGNOSIS — Z48815 Encounter for surgical aftercare following surgery on the digestive system: Secondary | ICD-10-CM | POA: Diagnosis not present

## 2015-04-05 DIAGNOSIS — R05 Cough: Secondary | ICD-10-CM | POA: Insufficient documentation

## 2015-04-05 DIAGNOSIS — I5032 Chronic diastolic (congestive) heart failure: Secondary | ICD-10-CM | POA: Diagnosis not present

## 2015-04-05 DIAGNOSIS — I255 Ischemic cardiomyopathy: Secondary | ICD-10-CM | POA: Diagnosis not present

## 2015-04-05 DIAGNOSIS — R059 Cough, unspecified: Secondary | ICD-10-CM | POA: Insufficient documentation

## 2015-04-05 MED ORDER — LEVOFLOXACIN 750 MG PO TABS: 750.0000 mg | ORAL_TABLET | Freq: Every day | ORAL | Status: DC

## 2015-04-05 NOTE — Progress Notes (Signed)
   Subjective:    Patient ID: Eric [name], male    DOB: [birth year], 81 y.o.   MRN: [id number]  HPI The patient is an 81 YO man coming in for persistent cough. Started about 2-3 weeks ago. Had pneumonia this summer after hospitalization and ended up with HCAP. Felt like he has not recovered since that time. Coughing up mild yellow sputum. No fevers or chills. No SOB or activity limitation.   Review of Systems  Constitutional: Negative for fever, chills, activity change, appetite change and unexpected weight change.  HENT: Positive for congestion. Negative for postnasal drip, rhinorrhea and sinus pressure.   Eyes: Negative.   Respiratory: Positive for cough. Negative for chest tightness, shortness of breath and wheezing.   Cardiovascular: Negative for chest pain, palpitations and leg swelling.  Gastrointestinal: Negative for nausea, vomiting, abdominal pain, diarrhea, constipation and abdominal distention.  Neurological: Negative.       Objective:   Physical Exam  Constitutional: He is oriented to person, place, and time. He appears well-developed and well-nourished.  HENT:  Head: Normocephalic and atraumatic.  Right Ear: External ear normal.  Left Ear: External ear normal.  Mouth/Throat: Oropharynx is clear and moist.  Eyes: EOM are normal.  Neck: Normal range of motion.  Cardiovascular: Normal rate and regular rhythm.   Pulmonary/Chest: Effort normal. No respiratory distress. He has wheezes. He has no rales. He exhibits no tenderness.  Minimal wheeze bilaterally  Abdominal: Soft. He exhibits no distension. There is no tenderness. There is no rebound.  Musculoskeletal: He exhibits no edema.  Neurological: He is alert and oriented to person, place, and time. Coordination normal.  Skin: Skin is warm and dry.  Psychiatric: He has a normal mood and affect.    Filed Vitals:   04/04/15 0953  BP: 132/62  Pulse: 68  Temp: 98 F (36.7 C)  TempSrc: Oral  Resp: 18  Height: 5\' 6"   (1.676 m)  Weight: 210 lb (95.255 kg)  SpO2: 98%      Assessment & Plan:

## 2015-04-05 NOTE — Assessment & Plan Note (Signed)
Getting CXR since his symptoms are slightly atypical and recent pneumonia (cleared on CXR prior to discharge). If positive treat with levaquin (still 90 days since hospital stay).

## 2015-04-08 DIAGNOSIS — Z8546 Personal history of malignant neoplasm of prostate: Secondary | ICD-10-CM | POA: Diagnosis not present

## 2015-04-09 LAB — PSA: PSA: 0.15

## 2015-04-10 DIAGNOSIS — Z87442 Personal history of urinary calculi: Secondary | ICD-10-CM | POA: Diagnosis not present

## 2015-04-10 DIAGNOSIS — Z8546 Personal history of malignant neoplasm of prostate: Secondary | ICD-10-CM | POA: Diagnosis not present

## 2015-04-10 DIAGNOSIS — R351 Nocturia: Secondary | ICD-10-CM | POA: Diagnosis not present

## 2015-04-15 DIAGNOSIS — H02054 Trichiasis without entropian left upper eyelid: Secondary | ICD-10-CM | POA: Diagnosis not present

## 2015-04-15 DIAGNOSIS — Z961 Presence of intraocular lens: Secondary | ICD-10-CM | POA: Diagnosis not present

## 2015-04-15 DIAGNOSIS — H02055 Trichiasis without entropian left lower eyelid: Secondary | ICD-10-CM | POA: Diagnosis not present

## 2015-04-15 DIAGNOSIS — H02051 Trichiasis without entropian right upper eyelid: Secondary | ICD-10-CM | POA: Diagnosis not present

## 2015-04-17 ENCOUNTER — Ambulatory Visit: Payer: Medicare Other | Admitting: Interventional Cardiology

## 2015-04-17 DIAGNOSIS — M47816 Spondylosis without myelopathy or radiculopathy, lumbar region: Secondary | ICD-10-CM | POA: Diagnosis not present

## 2015-04-19 ENCOUNTER — Other Ambulatory Visit: Payer: Self-pay | Admitting: Interventional Cardiology

## 2015-04-19 DIAGNOSIS — I6523 Occlusion and stenosis of bilateral carotid arteries: Secondary | ICD-10-CM

## 2015-04-26 ENCOUNTER — Ambulatory Visit: Payer: Medicare Other | Admitting: Internal Medicine

## 2015-04-29 ENCOUNTER — Ambulatory Visit (HOSPITAL_COMMUNITY)
Admission: RE | Admit: 2015-04-29 | Discharge: 2015-04-29 | Disposition: A | Discharge status: Home / Self Care | Payer: Medicare Other | Source: Ambulatory Visit | Attending: Cardiology | Admitting: Cardiology

## 2015-04-29 DIAGNOSIS — I10 Essential (primary) hypertension: Secondary | ICD-10-CM | POA: Insufficient documentation

## 2015-04-29 DIAGNOSIS — E785 Hyperlipidemia, unspecified: Secondary | ICD-10-CM | POA: Diagnosis not present

## 2015-04-29 DIAGNOSIS — I6523 Occlusion and stenosis of bilateral carotid arteries: Secondary | ICD-10-CM | POA: Diagnosis not present

## 2015-04-29 DIAGNOSIS — E119 Type 2 diabetes mellitus without complications: Secondary | ICD-10-CM | POA: Diagnosis not present

## 2015-05-01 DIAGNOSIS — I495 Sick sinus syndrome: Secondary | ICD-10-CM | POA: Diagnosis not present

## 2015-05-01 DIAGNOSIS — Z950 Presence of cardiac pacemaker: Secondary | ICD-10-CM | POA: Diagnosis not present

## 2015-05-07 ENCOUNTER — Telehealth: Payer: Self-pay | Admitting: Internal Medicine

## 2015-05-07 NOTE — Telephone Encounter (Signed)
Spoke to pharm and they are sending the CMS form needed to have the strips covered. Will have MD sign tomorrow.

## 2015-05-07 NOTE — Telephone Encounter (Signed)
Patient is requesting a refill of his test strips sent to [facility] on [address]. He states that they have been trying to communicate to [place] that they need something that sounds like a PA, but the patient is not educated on what they were trying to say, and there are no records of communication on file.

## 2015-05-08 ENCOUNTER — Telehealth: Payer: Self-pay | Admitting: Internal Medicine

## 2015-05-08 NOTE — Telephone Encounter (Signed)
LVM for pt to call back as soon as possible.   RE: form faxed to pharmacy and to call back if there are any questions.

## 2015-05-08 NOTE — Telephone Encounter (Signed)
MD signed.   Faxed back to [facility] processing division.

## 2015-05-13 ENCOUNTER — Telehealth: Payer: Self-pay | Admitting: Internal Medicine

## 2015-05-13 DIAGNOSIS — J449 Chronic obstructive pulmonary disease, unspecified: Secondary | ICD-10-CM

## 2015-05-13 NOTE — Telephone Encounter (Signed)
Spoke with pt, aware of recs.  Order placed to DME.  Nothing further needed.  

## 2015-05-13 NOTE — Telephone Encounter (Signed)
lmtcb X1 for pt  

## 2015-05-13 NOTE — Telephone Encounter (Signed)
Order- his DME- evaluate for light portable O2 or O2 concentrator    2L/m    Dx COPD mixed type

## 2015-05-13 NOTE — Telephone Encounter (Signed)
Patient Returned call [phone]

## 2015-05-13 NOTE — Telephone Encounter (Signed)
Called spoke with pt. He reports he had colonoscopy and also has 2 hernias in his stomach.  His current tank he has for O2 weighs more than 10 lbs and is too heavy He is needing more portable O2. He does not want anything on wheels either. DME is [facility] Please advise Dr. Annamaria Boots thanks

## 2015-05-14 ENCOUNTER — Telehealth: Payer: Self-pay | Admitting: Internal Medicine

## 2015-05-14 NOTE — Telephone Encounter (Signed)
Patient called this am to report that his stoma is bleeding "and has gotten bigger".  He reports that he spoke with [facility] and they advised that he call here.  He reports weeks of diarrhea.  He was on antibiotics a few weeks ago for pneumonia.  He is advised that we can set up an appt to eval diarrhea., but he needs to see his surgeon about stoma changes.  He dies not feel the bleeding is due to irritation from diarrhea.  He reports that if he takes an imodium the diarrhea stops.  He does not wish to come for office visit for diarrhea.  He is encouraged to contact [facility] about his stoma changes.  He will call back if he wants to have the diarrhea evaluated.  We discussed the possibility of an infection in the stool.  He does not wish to come for an appt.

## 2015-05-27 ENCOUNTER — Encounter: Payer: Self-pay | Admitting: Internal Medicine

## 2015-05-27 ENCOUNTER — Other Ambulatory Visit (INDEPENDENT_AMBULATORY_CARE_PROVIDER_SITE_OTHER): Payer: Medicare Other

## 2015-05-27 ENCOUNTER — Ambulatory Visit (INDEPENDENT_AMBULATORY_CARE_PROVIDER_SITE_OTHER): Payer: Medicare Other | Admitting: Internal Medicine

## 2015-05-27 ENCOUNTER — Telehealth: Payer: Self-pay | Admitting: Geriatric Medicine

## 2015-05-27 ENCOUNTER — Ambulatory Visit (INDEPENDENT_AMBULATORY_CARE_PROVIDER_SITE_OTHER)
Admission: RE | Admit: 2015-05-27 | Discharge: 2015-05-27 | Disposition: A | Discharge status: Home / Self Care | Payer: Medicare Other | Source: Ambulatory Visit | Attending: Internal Medicine | Admitting: Internal Medicine

## 2015-05-27 VITALS — BP 144/72 | HR 73 | Temp 98.3°F | Resp 14 | Ht 66.0 in | Wt 217.0 lb

## 2015-05-27 DIAGNOSIS — I6523 Occlusion and stenosis of bilateral carotid arteries: Secondary | ICD-10-CM | POA: Diagnosis not present

## 2015-05-27 DIAGNOSIS — R05 Cough: Secondary | ICD-10-CM | POA: Diagnosis not present

## 2015-05-27 DIAGNOSIS — I2510 Atherosclerotic heart disease of native coronary artery without angina pectoris: Secondary | ICD-10-CM | POA: Diagnosis not present

## 2015-05-27 DIAGNOSIS — E871 Hypo-osmolality and hyponatremia: Secondary | ICD-10-CM

## 2015-05-27 DIAGNOSIS — R059 Cough, unspecified: Secondary | ICD-10-CM

## 2015-05-27 DIAGNOSIS — R0600 Dyspnea, unspecified: Secondary | ICD-10-CM | POA: Diagnosis not present

## 2015-05-27 DIAGNOSIS — E119 Type 2 diabetes mellitus without complications: Secondary | ICD-10-CM | POA: Diagnosis not present

## 2015-05-27 DIAGNOSIS — I25810 Atherosclerosis of coronary artery bypass graft(s) without angina pectoris: Secondary | ICD-10-CM | POA: Diagnosis not present

## 2015-05-27 DIAGNOSIS — R42 Dizziness and giddiness: Secondary | ICD-10-CM | POA: Insufficient documentation

## 2015-05-27 DIAGNOSIS — D72829 Elevated white blood cell count, unspecified: Secondary | ICD-10-CM

## 2015-05-27 LAB — BRAIN NATRIURETIC PEPTIDE: PRO B NATRI PEPTIDE: 80 pg/mL (ref 0.0–100.0)

## 2015-05-27 LAB — COMPREHENSIVE METABOLIC PANEL
ALBUMIN: 4.1 g/dL (ref 3.5–5.2)
ALK PHOS: 95 U/L (ref 39–117)
ALT: 15 U/L (ref 0–53)
AST: 23 U/L (ref 0–37)
BUN: 15 mg/dL (ref 6–23)
CO2: 27 mEq/L (ref 19–32)
CREATININE: 0.83 mg/dL (ref 0.40–1.50)
Calcium: 10.9 mg/dL — ABNORMAL HIGH (ref 8.4–10.5)
Chloride: 94 mEq/L — ABNORMAL LOW (ref 96–112)
GFR: 93.34 mL/min (ref >60.00)
GLUCOSE: 107 mg/dL — AB (ref 70–99)
POTASSIUM: 4.8 meq/L (ref 3.5–5.1)
SODIUM: 127 meq/L — AB (ref 135–145)
TOTAL PROTEIN: 7.3 g/dL (ref 6.0–8.3)
Total Bilirubin: 0.6 mg/dL (ref 0.2–1.2)

## 2015-05-27 LAB — CBC
HCT: 38.3 % — ABNORMAL LOW (ref 39.0–52.0)
Hemoglobin: 12.7 g/dL — ABNORMAL LOW (ref 13.0–17.0)
MCHC: 33.2 g/dL (ref 30.0–36.0)
MCV: 90 fl (ref 78.0–100.0)
Platelets: 403 10*3/uL — ABNORMAL HIGH (ref 150.0–400.0)
RBC: 4.25 Mil/uL (ref 4.22–5.81)
RDW: 14.5 % (ref 11.5–15.5)
WBC: 31.9 10*3/uL (ref 4.0–10.5)

## 2015-05-27 LAB — LIPID PANEL
CHOLESTEROL: 108 mg/dL (ref 0–200)
HDL: 22.7 mg/dL — ABNORMAL LOW (ref >39.00)
LDL Cholesterol: 55 mg/dL (ref 0–99)
NonHDL: 84.81
Total CHOL/HDL Ratio: 5
Triglycerides: 151 mg/dL — ABNORMAL HIGH (ref 0.0–149.0)
VLDL: 30.2 mg/dL (ref 0.0–40.0)

## 2015-05-27 LAB — HEMOGLOBIN A1C: HEMOGLOBIN A1C: 6.9 % — AB (ref 4.6–6.5)

## 2015-05-27 NOTE — Assessment & Plan Note (Signed)
He did not have orthostatic hypotension on exam today. Okay with him staying off chlorthalidone and imdur. Checking labs today and CXR to rule out an additional cause of his symptoms including low sodium, kidney or liver dysfunction, anemia, diastolic heart failure exacerbation.

## 2015-05-27 NOTE — Assessment & Plan Note (Signed)
He did not get any improvement with the levaquin from October. Due for repeat CXR and if still changes will need CT scan as he has never had resolution with antibiotics. Checking CBC and CMP to see if he has hyponatremia or anemia to account for some of his breathlessness.

## 2015-05-27 NOTE — Patient Instructions (Signed)
We would like you to continue to NOT take imdur and NOT take chlorthalidone.   We are checking labs today as well as another chest x-ray. If there is still suspicion of findings on that we may need to get a CT scan of the chest to tell us what is going on instead of treating you with more antibiotics.   We would like you to return to the cardiologist as we are stopping the imdur. This is a medicine for chest pains so if the chest pains come back start taking the imdur again.

## 2015-05-27 NOTE — Progress Notes (Signed)
   Subjective:    Patient ID: Eric [name], male    DOB: [birth year], 81 y.o.   MRN: [id number]  HPI The patient is an 81 YO man coming in for feeling like he has low blood pressure. Episodes of dizziness. Stopped both his imdur and chlorthalidone. Has been feeling a little better since that time. Mostly when he stands, better if he drinks enough water. He has been treated for pneumonia at the last visit and is overdue for a repeat chest x-ray. No fevers or chills. His cough is not much improved.   Review of Systems  Constitutional: Negative for fever, chills, activity change, appetite change and unexpected weight change.  HENT: Positive for congestion. Negative for postnasal drip, rhinorrhea and sinus pressure.   Eyes: Negative.   Respiratory: Positive for cough and shortness of breath. Negative for chest tightness and wheezing.   Cardiovascular: Negative for chest pain, palpitations and leg swelling.  Gastrointestinal: Negative for nausea, vomiting, abdominal pain, diarrhea, constipation and abdominal distention.  Musculoskeletal: Negative.   Neurological: Positive for dizziness. Negative for seizures, weakness, numbness and headaches.      Objective:   Physical Exam  Constitutional: He is oriented to person, place, and time. He appears well-developed and well-nourished.  HENT:  Head: Normocephalic and atraumatic.  Right Ear: External ear normal.  Left Ear: External ear normal.  Mouth/Throat: Oropharynx is clear and moist.  Eyes: EOM are normal.  Neck: Normal range of motion.  Cardiovascular: Normal rate and regular rhythm.   Pulmonary/Chest: Effort normal. No respiratory distress. He has wheezes. He has no rales. He exhibits no tenderness.  Minimal wheeze left greater than right  Abdominal: Soft. He exhibits no distension. There is no tenderness. There is no rebound.  Musculoskeletal: He exhibits no edema.  Neurological: He is alert and oriented to person, place, and time.  Coordination normal.  Skin: Skin is warm and dry.  Psychiatric: He has a normal mood and affect.   Filed Vitals:   05/27/15 1438  BP: 148/72  Pulse: 73  Temp: 98.3 F (36.8 C)  TempSrc: Oral  Resp: 14  Height: 5\' 6"  (1.676 m)  Weight: 217 lb (98.431 kg)  SpO2: 96%      Assessment & Plan:

## 2015-05-27 NOTE — Progress Notes (Signed)
Pre visit review using our clinic review tool, if applicable. No additional management support is needed unless otherwise documented below in the visit note. 

## 2015-05-27 NOTE — Assessment & Plan Note (Signed)
Has stopped taking his imdur, okay to stay off. If recurrent chest pains he will resume. He will return to cardiology.

## 2015-05-27 NOTE — Assessment & Plan Note (Signed)
Checking CMP today for worsening. He is not drinking much liquids and trying to force them for his dizziness.

## 2015-05-27 NOTE — Telephone Encounter (Signed)
Patients WBC came back 31.9, please advise.

## 2015-05-28 NOTE — Telephone Encounter (Signed)
Have placed order for differential and smear. Will you call lab to see if they can add on? Also referral for hematology (blood specialist) for the high white blood count.

## 2015-05-28 NOTE — Telephone Encounter (Signed)
Lab not able to add on. I called patient and had to leave a message. I need to give him lab results and ask him to come back to have more blood drawn.

## 2015-05-29 ENCOUNTER — Telehealth: Payer: Self-pay | Admitting: Interventional Cardiology

## 2015-05-29 ENCOUNTER — Other Ambulatory Visit: Payer: Medicare Other

## 2015-05-29 DIAGNOSIS — R42 Dizziness and giddiness: Secondary | ICD-10-CM | POA: Diagnosis not present

## 2015-05-29 DIAGNOSIS — D72829 Elevated white blood cell count, unspecified: Secondary | ICD-10-CM

## 2015-05-29 NOTE — Telephone Encounter (Signed)
New Message    Pt is waking up dizzy in the am often, the PCP took him off the Blood Pressure med  The pt wants the rn to call him back

## 2015-05-29 NOTE — Telephone Encounter (Signed)
Returned pt call. lmtcb if assistance is still needed 

## 2015-05-29 NOTE — Telephone Encounter (Signed)
New Message   Pt calling because he is returning rn call he states that she is offering a sooner appt with Dr.Smith

## 2015-05-29 NOTE — Addendum Note (Signed)
Addended by: Resa Miner R on: 05/29/2015 10:28 AM   Modules accepted: Orders

## 2015-05-30 DIAGNOSIS — H02054 Trichiasis without entropian left upper eyelid: Secondary | ICD-10-CM | POA: Diagnosis not present

## 2015-05-30 DIAGNOSIS — Z0100 Encounter for examination of eyes and vision without abnormal findings: Secondary | ICD-10-CM | POA: Diagnosis not present

## 2015-05-30 DIAGNOSIS — E119 Type 2 diabetes mellitus without complications: Secondary | ICD-10-CM | POA: Diagnosis not present

## 2015-05-30 DIAGNOSIS — H02051 Trichiasis without entropian right upper eyelid: Secondary | ICD-10-CM | POA: Diagnosis not present

## 2015-05-30 LAB — CBC WITH DIFFERENTIAL/PLATELET
Basophils Absolute: 2.2 10*3/uL — ABNORMAL HIGH (ref 0.0–0.1)
Basophils Relative: 6 % — ABNORMAL HIGH (ref 0–1)
EOS PCT: 2 % (ref 0–5)
Eosinophils Absolute: 0.7 10*3/uL (ref 0.0–0.7)
HEMATOCRIT: 35.5 % — AB (ref 39.0–52.0)
Hemoglobin: 11.5 g/dL — ABNORMAL LOW (ref 13.0–17.0)
LYMPHS ABS: 1.5 10*3/uL (ref 0.7–4.0)
LYMPHS PCT: 4 % — AB (ref 12–46)
MCH: 29.5 pg (ref 26.0–34.0)
MCHC: 32.4 g/dL (ref 30.0–36.0)
MCV: 91 fL (ref 78.0–100.0)
MONO ABS: 6.4 10*3/uL — AB (ref 0.1–1.0)
MPV: 9.1 fL (ref 8.6–12.4)
Monocytes Relative: 17 % — ABNORMAL HIGH (ref 3–12)
Neutro Abs: 26.6 10*3/uL — ABNORMAL HIGH (ref 1.7–7.7)
Neutrophils Relative %: 71 % (ref 43–77)
Platelets: 395 10*3/uL (ref 150–400)
RBC: 3.9 MIL/uL — ABNORMAL LOW (ref 4.22–5.81)
RDW: 14.9 % (ref 11.5–15.5)
WBC: 37.4 10*3/uL — AB (ref 4.0–10.5)

## 2015-05-30 LAB — PATHOLOGIST SMEAR REVIEW

## 2015-05-30 LAB — HM DIABETES EYE EXAM

## 2015-05-30 NOTE — Telephone Encounter (Signed)
Lmom. Dr.Smith is ouit of the office until the new year, pt has an appt scheduled on 2/1 with Dr.Smith. Pt is currently being followed closely by pcp. Dr.Smith has no earlier availability.   Pt could call back with schedule with an PA/NP

## 2015-05-30 NOTE — Telephone Encounter (Signed)
F/u   Pt waiting on call back because he want a sooner appt and stated you can leave new appt on his vm if he isn't home.

## 2015-06-04 ENCOUNTER — Encounter: Payer: Self-pay | Admitting: Internal Medicine

## 2015-06-04 ENCOUNTER — Telehealth: Payer: Self-pay | Admitting: Hematology and Oncology

## 2015-06-04 NOTE — Telephone Encounter (Signed)
New patient appt-s/w patient and gave np appt for 12/28 @ 1 w/Dr. Alvy Bimler.

## 2015-06-05 ENCOUNTER — Telehealth: Payer: Self-pay | Admitting: Hematology and Oncology

## 2015-06-05 ENCOUNTER — Ambulatory Visit (HOSPITAL_BASED_OUTPATIENT_CLINIC_OR_DEPARTMENT_OTHER): Payer: Medicare Other | Admitting: Hematology and Oncology

## 2015-06-05 ENCOUNTER — Encounter: Payer: Self-pay | Admitting: Hematology and Oncology

## 2015-06-05 VITALS — BP 136/67 | HR 59 | Temp 98.0°F | Resp 18 | Ht 66.0 in | Wt 218.5 lb

## 2015-06-05 DIAGNOSIS — D72829 Elevated white blood cell count, unspecified: Secondary | ICD-10-CM | POA: Diagnosis not present

## 2015-06-05 DIAGNOSIS — J449 Chronic obstructive pulmonary disease, unspecified: Secondary | ICD-10-CM | POA: Diagnosis not present

## 2015-06-05 DIAGNOSIS — J189 Pneumonia, unspecified organism: Secondary | ICD-10-CM

## 2015-06-05 DIAGNOSIS — R42 Dizziness and giddiness: Secondary | ICD-10-CM

## 2015-06-05 MED ORDER — PREDNISONE 20 MG PO TABS: 20.0000 mg | ORAL_TABLET | Freq: Every day | ORAL | Status: DC

## 2015-06-05 NOTE — Assessment & Plan Note (Signed)
The patient has orthostatic dizziness and 2 of his blood pressure medications were placed on hold by his internist. The patient resumed those today without consulting with his physician. I told him that it was the right call to hold his chlorthalidone and Imdur as they can cause orthostatic hypotension. The patient is adamant he wants to go back on them because of diffuse leg swelling. At the end of the day, after much discussion, he is in agreement to cut the dose by at least half and I recommend he follows with his internist in the near future for medication adjustment

## 2015-06-05 NOTE — Assessment & Plan Note (Signed)
He has fluctuation of leukocytosis over the past year. The most recent blood work done a week and a half ago show mild leukoerythroblastic picture. Chest x-ray that was done recently did not show any significant signs of pneumonia but clinically, it appears he may have recent COPD exacerbation. He has seen pulmonologist early this year and was placed on oxygen intermittently. Overall, I suspect he may still have persistent COPD exacerbation. His sputum appears clear since recent antibiotic therapy. I recommend a trial of oral prednisone for 1 week and then see him back in 2 weeks we will repeat blood work. If the leukocytosis is still not much improved, we will proceed with further workup to exclude myeloproliferative disorder.

## 2015-06-05 NOTE — Telephone Encounter (Signed)
Gave patient avs report and appointments for January  °

## 2015-06-05 NOTE — Assessment & Plan Note (Signed)
The patient was diagnosed with COPD in the past. He could not understand why he is not getting better with oral antibiotics. I told the patient that he has COPD and will need a dose of prednisone to help to resolve recent COPD exacerbation. He is in agreement to a trial of prednisone.

## 2015-06-05 NOTE — Progress Notes (Signed)
[facility] NOTE  Patient Care Team: Rowe Clack, MD as PCP - General Belva Crome, MD as Consulting Physician (Cardiology) Suella Broad, MD as Consulting Physician (Physical Medicine and Rehabilitation) Deboraha Sprang, MD as Consulting Physician (Cardiology) Gatha Mayer, MD as Consulting Physician (Gastroenterology) Irine Seal, MD as Consulting Physician (Urology) Melida Quitter, MD as Consulting Physician (Otolaryngology) Susa Day, MD (Orthopedic Surgery) Deneise Lever, MD (Pulmonary Disease) Carol Ada, MD (Gastroenterology)  CHIEF COMPLAINTS/PURPOSE OF CONSULTATION:  Leukocytosis  HISTORY OF PRESENTING ILLNESS:  Eric [name] 81 y.o. male is here because of elevated WBC.  He was found to have abnormal CBC from recent blood tests. I reviewed his blood work dated back to several years ago. Before November 2015, his total white count has always been under 10. Starting early this year, he has fluctuation of leukocytosis from 11.5 to as high as 31,000. The patient have recurrent infection this year with recurrent pneumonia and perforated diverticulitis requiring surgery. Most recently, he had severe coughing and was placed on levofloxacin. The patient initially had production of yellowish sputum but since he completed antibiotics, his sputum has cleared up but he still has significant mucus production. There is not reported symptoms of urinary frequency/urgency or dysuria, diarrhea, joint swelling/pain or abnormal skin rash.  He had history of prostate cancer s/p seed implantation. His age appropriate screening programs are up-to-date. The patient has no prior diagnosis of autoimmune disease and was not prescribed corticosteroids related products. The patient is not a smoker.  Recently, he complained of orthostatic dizziness and he was taken off 2 different medications. Since then, he complained of leg swelling and resume taking those  medications today.  The patient is very upset that he could not understand why he is being seen here at the hematology clinic.   MEDICAL HISTORY:  Past Medical History  Diagnosis Date  . LACTOSE INTOLERANCE   . OBESITY   . CARDIOMYOPATHY, ISCHEMIC   . AORTIC SCLEROSIS   . SICK SINUS/ TACHY-BRADY SYNDROME 09/2007    s/p PPM st judes  . PERIPHERAL VASCULAR DISEASE   . CAROTID BRUIT, RIGHT 02/27/2008  . IBS (irritable bowel syndrome)   . ALLERGIC RHINITIS   . ANEMIA-NOS   . OA (osteoarthritis)   . COPD   . GERD   . HYPERLIPIDEMIA   . HIATAL HERNIA   . Diverticulosis   . Prostate cancer ([place])     seed implants 2004  . DIABETES MELLITUS-TYPE II     diet controlled  . CORONARY ARTERY DISEASE     CABG 1995, PTCA/DES 2008, 2009 and 08/2010  . HYPERTENSION   . Asthma   . Sleep apnea   . Partial small bowel obstruction ([place])   . Primary hyperparathyroidism ([place])     Lab Results Component Value Date  PTH 150.7* 02/13/2013  CALCIUM 11.0* 02/13/2013  CAION 1.21 03/15/2008    . SMALL BOWEL OBSTRUCTION 04/18/2009    Qualifier: History of  By: Asa Lente MD, Jannifer Rodney Cataract     surgery  . Symptomatic diverticulosis 01/18/2009    Qualifier: Diagnosis of  By: Trellis Paganini PA-c, Amy S   . Hx of echocardiogram     Echo (9/15):  Mild LVH, EF 50-55%, no RWMA, Gr 1 DD, MAC, mild LAE.  . Diastolic dysfunction, Grade 1 11/24/2014  . Diverticulitis of colon with perforation 11/22/2014  . Hyponatremia 11/22/2014    SURGICAL HISTORY: Past Surgical History  Procedure Laterality Date  .  Ptca  2008, 2009, 2012    with DES  . Partial small bowel obstruction  2009  . Bilateral cataracts    . Pacemaker insertion      DDD/[employer]         Last interrogation 2/13  on chart     Pacemaker guideline order Dr Tamala Julian on chart  . Inguinal hernia repair Bilateral   . Lumbar disc surgery  12/2008  . Coronary artery bypass graft    . Total hip arthroplasty  08/21/2011    Procedure: TOTAL HIP ARTHROPLASTY;   Surgeon: Johnn Hai, MD;  Location: [facility];  Service: Orthopedics;  Laterality: Right;  . Colonoscopy    . Esophagogastroduodenoscopy  multiple  . Penile prosthesis placement    . Back surgery    . Colon surgery    . Flexible sigmoidoscopy N/A 09/22/2013    Procedure: FLEXIBLE SIGMOIDOSCOPY;  Surgeon: Gatha Mayer, MD;  Location: [facility];  Service: Endoscopy;  Laterality: N/A;  . Colon resection N/A 11/28/2014    Procedure: EXPLORATORY LAPAROTOMY, SIGMOID COLECTOMY WITH COLOSTOMY;  Surgeon: Jackolyn Confer, MD;  Location: [facility];  Service: General;  Laterality: N/A;    SOCIAL HISTORY: Social History   Social History  . Marital Status: Widowed    Spouse Name: N/A  . Number of Children: N/A  . Years of Education: N/A   Occupational History  . retired    Social History Main Topics  . Smoking status: Former Smoker -- 1.00 packs/day for 25 years    Types: Cigarettes    Quit date: 06/08/1994  . Smokeless tobacco: Never Used  . Alcohol Use: No  . Drug Use: No  . 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 Not on file   Social History Narrative    FAMILY HISTORY: Family History  Problem Relation Age of Onset  . Colon cancer Neg Hx   . Hypertension Mother   . Heart attack    . Heart attack    . Stroke Neg Hx   . Heart attack Brother   . Cancer Mother     ALLERGIES:  is allergic to actos; celebrex; demerol; morphine and related; ciprofloxacin; metformin; and zocor.  MEDICATIONS:  Current Outpatient Prescriptions  Medication Sig Dispense Refill  . albuterol (PROVENTIL HFA) 108 (90 BASE) MCG/ACT inhaler Inhale 2 puffs into the lungs every 4 (four) hours as needed for shortness of breath. Wheezing     . albuterol (PROVENTIL) (2.5 MG/3ML) 0.083% nebulizer solution USE 1 VIAL WITH NEBULIZER EVERY 4 HOURS AS NEEDED FOR WHEEZING 75 mL 2  . aspirin EC 81 MG tablet Take 1 tablet (81  mg total) by mouth daily. 150 tablet 2  . fluticasone (FLONASE) 50 MCG/ACT nasal spray Place 2 sprays into both nostrils daily. Allergies 16 g 1  . glucose blood (ONE TOUCH TEST STRIPS) test strip 1 each by Other route 2 (two) times daily. Use to check blood sugars twice a day E11.9 100 each 3  . HYDROcodone-acetaminophen (NORCO) 7.5-325 MG tablet TK 1 T PO BID PRN  0  . isosorbide mononitrate (IMDUR) 30 MG 24 hr tablet Take 1 tablet (30 mg total) by mouth every evening. (Patient not taking: Reported on 05/27/2015) 30 tablet 11  . levofloxacin (LEVAQUIN) 750 MG tablet Take 1 tablet (750 mg total) by mouth daily. (Patient not taking: Reported on 05/27/2015) 10 tablet  0  . nitroGLYCERIN (NITROSTAT) 0.4 MG SL tablet Place 0.4 mg under the tongue every 5 (five) minutes as needed for chest pain. Chest pain     . Polyethyl Glycol-Propyl Glycol (SYSTANE) 0.4-0.3 % SOLN Apply 2 drops to eye 4 (four) times daily.     . pravastatin (PRAVACHOL) 20 MG tablet Take 3 tablets (60 mg total) by mouth daily. 270 tablet 3  . predniSONE (DELTASONE) 20 MG tablet Take 1 tablet (20 mg total) by mouth daily with breakfast. 7 tablet 0  . valsartan (DIOVAN) 160 MG tablet Take 1 tablet (160 mg total) by mouth daily. 90 tablet 3   No current facility-administered medications for this visit.    REVIEW OF SYSTEMS:   Constitutional: Denies fevers, chills or abnormal night sweats Eyes: Denies blurriness of vision, double vision or watery eyes Ears, nose, mouth, throat, and face: Denies mucositis or sore throat Cardiovascular: Denies palpitation, chest discomfort  Gastrointestinal:  Denies nausea, heartburn or change in bowel habits Skin: Denies abnormal skin rashes Lymphatics: Denies new lymphadenopathy or easy bruising Neurological:Denies numbness, tingling or new weaknesses Behavioral/Psych: Mood is stable, no new changes  All other systems were reviewed with the patient and are negative.  PHYSICAL EXAMINATION: ECOG  PERFORMANCE STATUS: 1 - Symptomatic but completely ambulatory  Filed Vitals:   06/05/15 1244  BP: 136/67  Pulse: 59  Temp: 98 F (36.7 C)  Resp: 18   Filed Weights   06/05/15 1244  Weight: 218 lb 8 oz (99.111 kg)    GENERAL:alert, no distress and comfortable. He is mild obese  SKIN: skin color, texture, turgor are normal, no rashes or significant lesions EYES: normal, conjunctiva are pink and non-injected, sclera clear OROPHARYNX:no exudate, no erythema and lips, buccal mucosa, and tongue normal  NECK: supple, thyroid normal size, non-tender, without nodularity LYMPH:  no palpable lymphadenopathy in the cervical, axillary or inguinal LUNGS:no wheezing, normal breathing effort. Bilateral basilar crackles audible, and deep breathing triggers significant coughing of white sputum EART: regular rate & rhythm and no murmurs  with mild bilateral lower extremity edema ABDOMEN:abdomen soft, non-tender and normal bowel sounds. He has colostomy in situ with no evidence of dehiscence  Musculoskeletal:no cyanosis of digits and no clubbing  PSYCH: alert & oriented x 3 with fluent speech NEURO: no focal motor/sensory deficits  LABORATORY DATA:  I have reviewed the data as listed Recent Results (from the past 2160 hour(s))  HgB A1c     Status: Abnormal   Collection Time: 05/27/15  3:12 PM  Result Value Ref Range   Hgb A1c MFr Bld 6.9 (H) 4.6 - 6.5 %    Comment: Glycemic Control Guidelines for People with Diabetes:Non Diabetic:  <6%Goal of Therapy: <7%Additional Action Suggested:  >8%   Comp Met (CMET)     Status: Abnormal   Collection Time: 05/27/15  3:12 PM  Result Value Ref Range   Sodium 127 (L) 135 - 145 mEq/L   Potassium 4.8 3.5 - 5.1 mEq/L   Chloride 94 (L) 96 - 112 mEq/L   CO2 27 19 - 32 mEq/L   Glucose, Bld 107 (H) 70 - 99 mg/dL   BUN 15 6 - 23 mg/dL   Creatinine, Ser 0.83 0.40 - 1.50 mg/dL   Total Bilirubin 0.6 0.2 - 1.2 mg/dL   Alkaline Phosphatase 95 39 - 117 U/L   AST 23 0  - 37 U/L   ALT 15 0 - 53 U/L   Total Protein 7.3 6.0 - 8.3 g/dL  Albumin 4.1 3.5 - 5.2 g/dL   Calcium 10.9 (H) 8.4 - 10.5 mg/dL   GFR 93.34 >60.00 mL/min  CBC     Status: Abnormal   Collection Time: 05/27/15  3:12 PM  Result Value Ref Range   WBC 31.9 Repeated and verified X2. (HH) 4.0 - 10.5 K/uL   RBC 4.25 4.22 - 5.81 Mil/uL   Platelets 403.0 (H) 150.0 - 400.0 K/uL   Hemoglobin 12.7 (L) 13.0 - 17.0 g/dL   HCT 38.3 (L) 39.0 - 52.0 %   MCV 90.0 78.0 - 100.0 fl   MCHC 33.2 30.0 - 36.0 g/dL   RDW 14.5 11.5 - 15.5 %  Lipid panel     Status: Abnormal   Collection Time: 05/27/15  3:12 PM  Result Value Ref Range   Cholesterol 108 0 - 200 mg/dL    Comment: ATP III Classification       Desirable:  < 200 mg/dL               Borderline High:  200 - 239 mg/dL          High:  > = 240 mg/dL   Triglycerides 151.0 (H) 0.0 - 149.0 mg/dL    Comment: Normal:  <150 mg/dLBorderline High:  150 - 199 mg/dL   HDL 22.70 (L) >39.00 mg/dL   VLDL 30.2 0.0 - 40.0 mg/dL   LDL Cholesterol 55 0 - 99 mg/dL   Total CHOL/HDL Ratio 5     Comment:                Men          Women1/2 Average Risk     3.4          3.3Average Risk          5.0          4.42X Average Risk          9.6          7.13X Average Risk          15.0          11.0                       NonHDL 84.81     Comment: NOTE:  Non-HDL goal should be 30 mg/dL higher than patient's LDL goal (i.e. LDL goal of < 70 mg/dL, would have non-HDL goal of < 100 mg/dL)  B Nat Peptide     Status: None   Collection Time: 05/27/15  3:12 PM  Result Value Ref Range   Pro B Natriuretic peptide (BNP) 80.0 0.0 - 100.0 pg/mL  Pathologist smear review     Status: None   Collection Time: 05/29/15 10:23 AM  Result Value Ref Range   Path Review SEE NOTE     Comment:   Elevated white count with immature granulocytic cells including blasts, promyelocytes, and myelocytes.                                                         Recommend full hematologic evaluation,  if  clinically indicated. Normocytic/Normochromic anemia with unremarkable RBC morphology. Platelets are unremarkable. Reviewed by Odis Hollingshead, MD, PhD, FCAP (Electronic Signature on File) 05/30/15   CBC w/Diff     Status: Abnormal  Collection Time: 05/29/15 10:39 AM  Result Value Ref Range   WBC 37.4 (H) 4.0 - 10.5 K/uL   RBC 3.90 (L) 4.22 - 5.81 MIL/uL   Hemoglobin 11.5 (L) 13.0 - 17.0 g/dL   HCT 35.5 (L) 39.0 - 52.0 %   MCV 91.0 78.0 - 100.0 fL   MCH 29.5 26.0 - 34.0 pg   MCHC 32.4 30.0 - 36.0 g/dL   RDW 14.9 11.5 - 15.5 %   Platelets 395 150 - 400 K/uL   MPV 9.1 8.6 - 12.4 fL   Neutrophils Relative % 71 43 - 77 %   Neutro Abs 26.6 (H) 1.7 - 7.7 K/uL   Lymphocytes Relative 4 (L) 12 - 46 %   Lymphs Abs 1.5 0.7 - 4.0 K/uL   Monocytes Relative 17 (H) 3 - 12 %   Monocytes Absolute 6.4 (H) 0.1 - 1.0 K/uL   Eosinophils Relative 2 0 - 5 %   Eosinophils Absolute 0.7 0.0 - 0.7 K/uL   Basophils Relative 6 (H) 0 - 1 %   Basophils Absolute 2.2 (H) 0.0 - 0.1 K/uL   Smear Review SEE NOTE     Comment: Moderate left shift (>5% metas and myelos, occ pro noted)  HM DIABETES EYE EXAM     Status: None   Collection Time: 05/30/15 12:00 AM  Result Value Ref Range   HM Diabetic Eye Exam No Retinopathy No Retinopathy    RADIOGRAPHIC STUDIES: I have personally reviewed the radiological images as listed and agreed with the findings in the report. Dg Chest 2 View  05/27/2015  CLINICAL DATA:  Pneumonia 2 months ago. Cough. Dizziness. Hypertension. COPD. Asthma. EXAM: CHEST  2 VIEW COMPARISON:  04/04/2015 FINDINGS: Prior median sternotomy. Hyperinflation. Pacer with leads at right atrium and right ventricle. No lead discontinuity. Midline trachea. Borderline cardiomegaly. No pleural effusion or pneumothorax. Lower lobe predominant interstitial thickening is moderate. No lobar consolidation. IMPRESSION: COPD/chronic bronchitis.  No acute superimposed process. Electronically Signed   By: Abigail Miyamoto  M.D.   On: 05/27/2015 15:29    ASSESSMENT & PLAN Leukocytosis He has fluctuation of leukocytosis over the past year. The most recent blood work done a week and a half ago show mild leukoerythroblastic picture. Chest x-ray that was done recently did not show any significant signs of pneumonia but clinically, it appears he may have recent COPD exacerbation. He has seen pulmonologist early this year and was placed on oxygen intermittently. Overall, I suspect he may still have persistent COPD exacerbation. His sputum appears clear since recent antibiotic therapy. I recommend a trial of oral prednisone for 1 week and then see him back in 2 weeks we will repeat blood work. If the leukocytosis is still not much improved, we will proceed with further workup to exclude myeloproliferative disorder.  COPD mixed type [facility]) The patient was diagnosed with COPD in the past. He could not understand why he is not getting better with oral antibiotics. I told the patient that he has COPD and will need a dose of prednisone to help to resolve recent COPD exacerbation. He is in agreement to a trial of prednisone.  Dizziness The patient has orthostatic dizziness and 2 of his blood pressure medications were placed on hold by his internist. The patient resumed those today without consulting with his physician. I told him that it was the right call to hold his chlorthalidone and Imdur as they can cause orthostatic hypotension. The patient is adamant he wants to go back  on them because of diffuse leg swelling. At the end of the day, after much discussion, he is in agreement to cut the dose by at least half and I recommend he follows with his internist in the near future for medication adjustment

## 2015-06-11 ENCOUNTER — Telehealth: Payer: Self-pay | Admitting: Internal Medicine

## 2015-06-11 NOTE — Telephone Encounter (Signed)
Patient reports he has a new hernia on his abdomen and he is having to empty his colostomy bag more often.  "15 times yesterday".  He will come in and see Arta Bruce, PA tomorrow at 9:15

## 2015-06-12 ENCOUNTER — Ambulatory Visit: Payer: Medicare Other | Admitting: Physician Assistant

## 2015-06-13 ENCOUNTER — Ambulatory Visit: Payer: Medicare Other | Admitting: Oncology

## 2015-06-19 ENCOUNTER — Other Ambulatory Visit (HOSPITAL_BASED_OUTPATIENT_CLINIC_OR_DEPARTMENT_OTHER): Payer: Medicare Other

## 2015-06-19 ENCOUNTER — Telehealth: Payer: Self-pay | Admitting: Hematology and Oncology

## 2015-06-19 ENCOUNTER — Ambulatory Visit (HOSPITAL_BASED_OUTPATIENT_CLINIC_OR_DEPARTMENT_OTHER): Payer: Medicare Other | Admitting: Hematology and Oncology

## 2015-06-19 VITALS — BP 152/55 | HR 75 | Temp 98.3°F | Resp 18 | Ht 66.0 in | Wt 221.5 lb

## 2015-06-19 DIAGNOSIS — D72829 Elevated white blood cell count, unspecified: Secondary | ICD-10-CM

## 2015-06-19 DIAGNOSIS — J449 Chronic obstructive pulmonary disease, unspecified: Secondary | ICD-10-CM | POA: Diagnosis not present

## 2015-06-19 LAB — MANUAL DIFFERENTIAL
ALC: 0.6 10*3/uL — ABNORMAL LOW (ref 0.9–3.3)
ANC (CHCC manual diff): 25.7 10*3/uL — ABNORMAL HIGH (ref 1.5–6.5)
BASOPHIL: 2 % (ref 0–2)
BLASTS: 1 % — AB (ref 0–0)
Band Neutrophils: 0 % (ref 0–10)
EOS: 2 % (ref 0–7)
LYMPH: 2 % — AB (ref 14–49)
METAMYELOCYTES PCT: 5 % — AB (ref 0–0)
MONO: 8 % (ref 0–14)
MYELOCYTES: 4 % — AB (ref 0–0)
OTHER CELL: 0 % (ref 0–0)
PLT EST: ADEQUATE
PROMYELO: 0 % (ref 0–0)
SEG: 76 % (ref 38–77)
VARIANT LYMPH: 0 % (ref 0–0)
nRBC: 0 % (ref 0–0)

## 2015-06-19 LAB — CBC WITH DIFFERENTIAL/PLATELET
HEMATOCRIT: 39.8 % (ref 38.4–49.9)
HGB: 12.7 g/dL — ABNORMAL LOW (ref 13.0–17.1)
MCH: 29.7 pg (ref 27.2–33.4)
MCHC: 32 g/dL (ref 32.0–36.0)
MCV: 92.6 fL (ref 79.3–98.0)
Platelets: 246 10*3/uL (ref 140–400)
RBC: 4.3 10*6/uL (ref 4.20–5.82)
RDW: 15.1 % — ABNORMAL HIGH (ref 11.0–14.6)
WBC: 30.2 10*3/uL — ABNORMAL HIGH (ref 4.0–10.3)

## 2015-06-19 LAB — CHCC SMEAR

## 2015-06-19 NOTE — Progress Notes (Signed)
[place] OFFICE PROGRESS NOTE  [name], MD SUMMARY OF HEMATOLOGIC HISTORY:  Eric [name] 82 y.o. male is here because of elevated WBC.  He was found to have abnormal CBC from recent blood tests. I reviewed his blood work dated back to several years ago. Before November 2015, his total white count has always been under 10. Starting early this year, he has fluctuation of leukocytosis from 11.5 to as high as 31,000. The patient have recurrent infection this year with recurrent pneumonia and perforated diverticulitis requiring surgery. Most recently, he had severe coughing and was placed on levofloxacin. The patient initially had production of yellowish sputum but since he completed antibiotics, his sputum has cleared up but he still has significant mucus production. There is not reported symptoms of urinary frequency/urgency or dysuria, diarrhea, joint swelling/pain or abnormal skin rash.  He had history of prostate cancer s/p seed implantation. His age appropriate screening programs are up-to-date. The patient has no prior diagnosis of autoimmune disease and was not prescribed corticosteroids related products. The patient is not a smoker.  Recently, he complained of orthostatic dizziness and he was taken off 2 different medications. Since then, he complained of leg swelling and resume taking those medications today.  The patient is very upset that he could not understand why he is being seen here at the hematology clinic.  INTERVAL HISTORY: Eric [name] 82 y.o. male returns for further follow-up. He completed a course of prednisone with improvement of his cough. Denies fevers or chills.  I have reviewed the past medical history, past surgical history, social history and family history with the patient and they are unchanged from previous note.  ALLERGIES:  is allergic to actos; celebrex; demerol; morphine and related; ciprofloxacin; metformin; and  zocor.  MEDICATIONS:  Current Outpatient Prescriptions  Medication Sig Dispense Refill  . albuterol (PROVENTIL HFA) 108 (90 BASE) MCG/ACT inhaler Inhale 2 puffs into the lungs every 4 (four) hours as needed for shortness of breath. Wheezing     . albuterol (PROVENTIL) (2.5 MG/3ML) 0.083% nebulizer solution USE 1 VIAL WITH NEBULIZER EVERY 4 HOURS AS NEEDED FOR WHEEZING 75 mL 2  . fluticasone (FLONASE) 50 MCG/ACT nasal spray Place 2 sprays into both nostrils daily. Allergies 16 g 1  . glucose blood (ONE TOUCH TEST STRIPS) test strip 1 each by Other route 2 (two) times daily. Use to check blood sugars twice a day E11.9 100 each 3  . nitroGLYCERIN (NITROSTAT) 0.4 MG SL tablet Place 0.4 mg under the tongue every 5 (five) minutes as needed for chest pain. Chest pain     . Polyethyl Glycol-Propyl Glycol (SYSTANE) 0.4-0.3 % SOLN Apply 2 drops to eye 4 (four) times daily.     . pravastatin (PRAVACHOL) 20 MG tablet Take 3 tablets (60 mg total) by mouth daily. 270 tablet 3  . valsartan (DIOVAN) 160 MG tablet Take 1 tablet (160 mg total) by mouth daily. 90 tablet 3   No current facility-administered medications for this visit.     REVIEW OF SYSTEMS:   Constitutional: Denies fevers, chills or night sweats Eyes: Denies blurriness of vision Ears, nose, mouth, throat, and face: Denies mucositis or sore throat Cardiovascular: Denies palpitation, chest discomfort or lower extremity swelling Gastrointestinal:  Denies nausea, heartburn or change in bowel habits Skin: Denies abnormal skin rashes Lymphatics: Denies new lymphadenopathy or easy bruising Neurological:Denies numbness, tingling or new weaknesses Behavioral/Psych: Mood is stable, no new changes  All other systems were reviewed with  the patient and are negative.  PHYSICAL EXAMINATION: ECOG PERFORMANCE STATUS: 1 - Symptomatic but completely ambulatory  Filed Vitals:   06/19/15 1333  BP: 152/55  Pulse: 75  Temp: 98.3 F (36.8 C)  Resp: 18    Filed Weights   06/19/15 1333  Weight: 221 lb 8 oz (100.472 kg)    GENERAL:alert, no distress and comfortable SKIN: skin color, texture, turgor are normal, no rashes or significant lesions EYES: normal, Conjunctiva are pink and non-injected, sclera clear Musculoskeletal:no cyanosis of digits and no clubbing  NEURO: alert & oriented x 3 with fluent speech, no focal motor/sensory deficits  LABORATORY DATA:  I have reviewed the data as listed Results for orders placed or performed in visit on 06/19/15 (from the past 48 hour(s))  CBC with Differential/Platelet     Status: Abnormal   Collection Time: 06/19/15  1:17 PM  Result Value Ref Range   WBC 30.2 (H) 4.0 - 10.3 10e3/uL   HGB 12.7 (L) 13.0 - 17.1 g/dL   HCT 39.8 38.4 - 49.9 %   Platelets 246 140 - 400 10e3/uL   MCV 92.6 79.3 - 98.0 fL   MCH 29.7 27.2 - 33.4 pg   MCHC 32.0 32.0 - 36.0 g/dL   RBC 4.30 4.20 - 5.82 10e6/uL   RDW 15.1 (H) 11.0 - 14.6 %  Smear     Status: None   Collection Time: 06/19/15  1:17 PM  Result Value Ref Range   Smear Result Smear Available   Manual Differential [facility]     Status: Abnormal   Collection Time: 06/19/15  1:17 PM  Result Value Ref Range   ANC ([facility] manual diff) 25.7 (H) 1.5 - 6.5 10e3/uL   ALC 0.6 (L) 0.9 - 3.3 10e3/uL   SEG 76 38 - 77 %   Band Neutrophils 0 0 - 10 %   LYMPH 2 (L) 14 - 49 %   MONO 8 0 - 14 %   EOS 2 0 - 7 %   Basophil 2 0 - 2 %   Metamyelocytes 5 (H) 0 - 0 %   Myelocytes 4 (H) 0 - 0 %   PROMYELO 0 0 - 0 %   Blasts 1 (H) 0 - 0 %   Variant Lymph 0 0 - 0 %   Other Cell 0 0 - 0 %   nRBC 0 0 - 0 %   PLT EST Adequate Adequate   Platelet Morphology Occas Giant Plt Within Normal Limits    Lab Results  Component Value Date   WBC 30.2* 06/19/2015   HGB 12.7* 06/19/2015   HCT 39.8 06/19/2015   MCV 92.6 06/19/2015   PLT 246 06/19/2015   I review his peripheral blood smear which is consistent with leukoerythroblastic picture ASSESSMENT & PLAN:  Leukocytosis He has  persistent leukocytosis with leukoerythroblastic picture, concern for undiagnosed myeloproliferative disorder or myelodysplastic syndrome. I recommend we proceed with bone marrow aspirate and biopsy and he agreed to proceed.  COPD mixed type ([place]) His symptoms are resolving and improved with prednisone. Continue conservative management.     All questions were answered. The patient knows to call the clinic with any problems, questions or concerns. No barriers to learning was detected.  I spent 15 minutes counseling the patient face to face. The total time spent in the appointment was 20 minutes and more than 50% was on counseling.     Alvy Bimler, Dorian Duval, MD 1/11/20174:25 PM

## 2015-06-19 NOTE — Assessment & Plan Note (Addendum)
He has persistent leukocytosis with leukoerythroblastic picture, concern for undiagnosed myeloproliferative disorder or myelodysplastic syndrome. I recommend we proceed with bone marrow aspirate and biopsy and he agreed to proceed.

## 2015-06-19 NOTE — Telephone Encounter (Signed)
Gave patient avs report and appointments for January. Patient aware he is to arrive at 7:30 am tomorrow for [facility]. Per charge nurse ok to add.

## 2015-06-20 ENCOUNTER — Ambulatory Visit (HOSPITAL_BASED_OUTPATIENT_CLINIC_OR_DEPARTMENT_OTHER): Payer: Medicare Other | Admitting: Hematology and Oncology

## 2015-06-20 ENCOUNTER — Other Ambulatory Visit (HOSPITAL_COMMUNITY)
Admission: RE | Admit: 2015-06-20 | Discharge: 2015-06-20 | Disposition: A | Discharge status: Home / Self Care | Payer: Medicare Other | Source: Ambulatory Visit | Attending: Hematology and Oncology | Admitting: Hematology and Oncology

## 2015-06-20 VITALS — BP 144/52 | HR 69 | Temp 97.7°F | Resp 20

## 2015-06-20 DIAGNOSIS — Z79899 Other long term (current) drug therapy: Secondary | ICD-10-CM | POA: Diagnosis not present

## 2015-06-20 DIAGNOSIS — J449 Chronic obstructive pulmonary disease, unspecified: Secondary | ICD-10-CM | POA: Diagnosis not present

## 2015-06-20 DIAGNOSIS — D649 Anemia, unspecified: Secondary | ICD-10-CM | POA: Diagnosis not present

## 2015-06-20 DIAGNOSIS — D72829 Elevated white blood cell count, unspecified: Secondary | ICD-10-CM | POA: Diagnosis not present

## 2015-06-20 DIAGNOSIS — D7589 Other specified diseases of blood and blood-forming organs: Secondary | ICD-10-CM | POA: Diagnosis not present

## 2015-06-20 DIAGNOSIS — D471 Chronic myeloproliferative disease: Secondary | ICD-10-CM

## 2015-06-20 LAB — BONE MARROW EXAM

## 2015-06-20 LAB — SEDIMENTATION RATE: SED RATE: 16 mm/h (ref 0–30)

## 2015-06-20 NOTE — Progress Notes (Signed)
Bone Marrow Biopsy and Aspiration Procedure Note   Informed consent was obtained and potential risks including bleeding, infection and pain were reviewed with the patient. The patient's name, date of birth, identification, consent and allergies were verified prior to the start of procedure and time out was performed.  The left posterior iliac crest was chosen as the site of biopsy.  The skin was prepped with Betadine solution.   8 cc of 1% lidocaine was used to provide local anaesthesia.   10 cc of bone marrow aspirate was obtained followed by 1 inch biopsy.   The procedure was tolerated well and there were no complications.  The patient was stable at the end of the procedure.  Specimens sent for flow cytometry, cytogenetics and additional studies.  Spent 25 minutes on the procedure

## 2015-06-20 NOTE — Patient Instructions (Signed)
Bone Marrow Aspiration and Bone Marrow Biopsy Bone marrow aspiration and bone marrow biopsy are procedures that are done to diagnose blood disorders. You may also have one of these procedures to help diagnose infections or some types of cancer. Bone marrow is the soft tissue that is inside your bones. Blood cells are produced in bone marrow. For bone marrow aspiration, a sample of tissue in liquid form is removed from inside your bone. For a bone marrow biopsy, a small core of bone marrow tissue is removed. Then these samples are examined under a microscope or tested in a lab. You may need these procedures if you have an abnormal complete blood count (CBC). The aspiration or biopsy sample is usually taken from the top of your hip bone. Sometimes, an aspiration sample is taken from your chest bone (sternum). LET YOUR HEALTH CARE PROVIDER KNOW ABOUT:  Any allergies you have.  All medicines you are taking, including vitamins, herbs, eye drops, creams, and over-the-counter medicines.  Previous problems you or members of your family have had with the use of anesthetics.  Any blood disorders you have.  Previous surgeries you have had.  Any medical conditions you may have.  Whether you are pregnant or you think that you may be pregnant. RISKS AND COMPLICATIONS Generally, this is a safe procedure. However, problems may occur, including:  Infection.  Bleeding. BEFORE THE PROCEDURE  Ask your health care provider about:  Changing or stopping your regular medicines. This is especially important if you are taking diabetes medicines or blood thinners.  Taking medicines such as aspirin and ibuprofen. These medicines can thin your blood. Do not take these medicines before your procedure if your health care provider instructs you not to.  Plan to have someone take you home after the procedure.  If you go home right after the procedure, plan to have someone with you for 24 hours. PROCEDURE   An  IV tube may be inserted into one of your veins.  The injection site will be cleaned with a germ-killing solution (antiseptic).  You will be given one or more of the following:  A medicine that helps you relax (sedative).  A medicine that numbs the area (local anesthetic).  The bone marrow sample will be removed as follows:  For an aspiration, a hollow needle will be inserted through your skin and into your bone. Bone marrow fluid will be drawn up into a syringe.  For a biopsy, your health care provider will use a hollow needle to remove a core of tissue from your bone marrow.  The needle will be removed.  A bandage (dressing) will be placed over the insertion site and taped in place. The procedure may vary among health care providers and hospitals. AFTER THE PROCEDURE  Your blood pressure, heart rate, breathing rate, and blood oxygen level will be monitored often until the medicines you were given have worn off.  Return to your normal activities as directed by your health care provider.   This information is not intended to replace advice given to you by your health care provider. Make sure you discuss any questions you have with your health care provider.   Document Released: 05/28/2004 Document Revised: 10/09/2014 Document Reviewed: 05/16/2014 Elsevier Interactive Patient Education 2016 Elsevier Inc.  

## 2015-06-20 NOTE — Assessment & Plan Note (Signed)
His symptoms are resolving and improved with prednisone. Continue conservative management.

## 2015-06-20 NOTE — Progress Notes (Signed)
[id number] BMBX site clean dry, can see shadow of blood under bandaid but not through bandaid.

## 2015-06-20 NOTE — Progress Notes (Signed)
0820: Bone marrow biopsy and aspirate performed by Dr. Alvy Bimler. Timeout performed at 0800, procedure started at 0810 and completed at 0819. Pt and VS remained stable throughout procedure. Pt laid flat per MD order to be monitored for 30 minutes.

## 2015-06-21 ENCOUNTER — Telehealth: Payer: Self-pay

## 2015-06-21 NOTE — Telephone Encounter (Signed)
Patient came into office with list of medical supplies he orders through [facility]---patient was requesting that we send refill requests to [employer]---i have talked with [facility] [employer] and they do not operate that way--the patient is the only person that can call to place order for supplies---i left message on patients answering machine with this explanantion, patient can call back to our office if he has further questions

## 2015-06-25 ENCOUNTER — Telehealth: Payer: Self-pay | Admitting: *Deleted

## 2015-06-25 DIAGNOSIS — D471 Chronic myeloproliferative disease: Secondary | ICD-10-CM | POA: Diagnosis not present

## 2015-06-25 NOTE — Telephone Encounter (Signed)
Informed pt test results are back and Dr. Alvy Bimler can see him tomorrow morning or at noon.  Pt will come at 12 noon tomorrow for MD visit.  Urgent POF sent.

## 2015-06-26 ENCOUNTER — Telehealth: Payer: Self-pay | Admitting: Pharmacist

## 2015-06-26 ENCOUNTER — Telehealth: Payer: Self-pay | Admitting: Hematology and Oncology

## 2015-06-26 ENCOUNTER — Ambulatory Visit (HOSPITAL_BASED_OUTPATIENT_CLINIC_OR_DEPARTMENT_OTHER): Payer: Medicare Other | Admitting: Hematology and Oncology

## 2015-06-26 ENCOUNTER — Encounter: Payer: Self-pay | Admitting: Hematology and Oncology

## 2015-06-26 VITALS — BP 146/62 | HR 77 | Temp 98.0°F | Resp 19 | Wt 218.7 lb

## 2015-06-26 DIAGNOSIS — C9210 Chronic myeloid leukemia, BCR/ABL-positive, not having achieved remission: Secondary | ICD-10-CM | POA: Diagnosis not present

## 2015-06-26 HISTORY — DX: Chronic myeloid leukemia, BCR/ABL-positive, not having achieved remission: C92.10

## 2015-06-26 MED ORDER — DASATINIB 70 MG PO TABS: 70.0000 mg | ORAL_TABLET | Freq: Every day | ORAL | Status: DC

## 2015-06-26 NOTE — Telephone Encounter (Signed)
Gv pt appt for 2/28.

## 2015-06-26 NOTE — Patient Instructions (Signed)
Dasatinib oral tablet  What is this medicine?  DASATINIB (da SA ti nib) is a medicine that targets specific proteins in cancer cells and stops the cancer cells from growing. It is used to treat some kinds of leukemia.  This medicine may be used for other purposes; ask your health care provider or pharmacist if you have questions.  What should I tell my health care provider before I take this medicine?  They need to know if you have any of these conditions:  -bleeding problems  -heart disease  -immune system problems  -irregular heartbeat  -liver disease  -low levels of magnesium or potassium in the blood  -an unusual or allergic reaction to dasatinib, lactose, other medicines, foods, dyes, or preservatives  -pregnant or trying to get pregnant  -breast-feeding  How should I use this medicine?  Take this medicine by mouth with a glass of water. Follow the directions on the prescription label. You can take it with or without food. Do not take with grapefruit juice. Do not cut, crush or chew this medicine. Take your medicine at regular intervals. Do not take your medicine more often than directed. Do not stop taking except on your doctor's advice.  Avoid taking antacids containing aluminum, calcium, or magnesium within 2 hours of taking this medicine. You can take such antacids up to 2 hours before or 2 hours after this medicine. Avoid taking all other medicines that reduce stomach acid.  Talk to your pediatrician regarding the use of this medicine in children. Special care may be needed.  Overdosage: If you think you have taken too much of this medicine contact a poison control center or emergency room at once.  NOTE: This medicine is only for you. Do not share this medicine with others.  What if I miss a dose?  If you miss a dose, take your next scheduled dose at its regular time. Do not take double or extra doses. Talk to your doctor if you are not sure what to do.  What may interact with this medicine?  Do not take  this medicine with any of the following medications:  -certain medicines for fungal infections like fluconazole, itraconazole, ketoconazole, posaconazole, voriconazole  -certain medicines for stomach problems like cimetidine, famotidine, ranitidine, omeprazole  -cisapride  -dofetilide  -dronedarone  -ergot alkaloids like dihydroergotamine, ergonovine, ergotamine, methylergonovine  -pimozide  -St. John's Wort  -thioridazine  -ziprasidone  This medicine may also interact with the following medications:  -acetaminophen  -alfentanil  -antiviral medicines for HIV or AIDS  -aspirin  -carbamazepine  -certain antibiotics like clarithromycin, erythromycin, rifampin, rifabutin, rifapentine, telithromycin, troleandomycin  -certain medicines for cholesterol like atorvastatin  -certain medicines that treat or prevent blood clots like warfarin  -cyclosporine  -dexamethasone  -fentanyl  -NSAIDS, medicines for pain and inflammation, like ibuprofen, ketoprofen, naproxen  -other medicines that prolong the QT interval (cause an abnormal heart rhythm)  -phenobarbital  -phenytoin  -sirolimus  -tacrolimus  This list may not describe all possible interactions. Give your health care provider a list of all the medicines, herbs, non-prescription drugs, or dietary supplements you use. Also tell them if you smoke, drink alcohol, or use illegal drugs. Some items may interact with your medicine.  What should I watch for while using this medicine?  Visit your doctor for regular check ups. Report any side effects. Continue your course of treatment unless your doctor tells you to stop. You will need blood work done while you are taking this medicine.  You may   need blood work done while you are taking this medicine.  Do not become pregnant while taking this medicine or for 30 days after stopping it. Women should inform their doctor if they wish to become pregnant or think they might be pregnant. There is a potential for serious side effects to an  unborn child. Talk to your health care professional or pharmacist for more information. Do not breast-feed an infant while taking this medicine and for 2 weeks after the last dose.  What side effects may I notice from receiving this medicine?  Side effects that you should report to your doctor or health care professional as soon as possible:  -allergic reactions like skin rash, itching or hives, swelling of the face, lips, or tongue  -low blood counts - this medicine may decrease the number of white blood cells, red blood cells, and platelets.  You may be at increased risk for infections and bleeding.  -signs of infection - fever or chills, cough, sore throat, pain or difficulty passing urine  -signs of decreased platelets or bleeding - bruising, pinpoint red spots on the skin, black, tarry stools, blood in the urine, nosebleeds  -signs of decreased red blood cells - unusual weakness or tiredness, fainting spells, lightheadedness  -breathing problems  -dry cough  -fast, irregular heartbeat  -redness, blistering, peeling or loosening of the skin, including inside the mouth  -swelling of the legs or ankles, or other parts of the body  -sores or white patches in your mouth or throat  -sudden weight gain  Side effects that usually do not require medical attention (report to your prescriber or health care professional if they continue or are bothersome):  -diarrhea  -headache  -nausea, vomiting  -weak or tired  This list may not describe all possible side effects. Call your doctor for medical advice about side effects. You may report side effects to FDA at 1-800-FDA-1088.  Where should I keep my medicine?  Keep out of the reach of children.  Store at room temperature between 20 and 25 degrees C (68 and 77 degrees F). Throw away any unused medicine after the expiration date.  NOTE: This sheet is a summary. It may not cover all possible information. If you have questions about this medicine, talk to your doctor,  pharmacist, or health care provider.      2016, Elsevier/Gold Standard. (2014-08-01 21:58:39)

## 2015-06-26 NOTE — Telephone Encounter (Signed)
06/26/15: New Rx for Sprycel faxed to [facility]   Oral Chemotherapy Pharmacist Encounter   I spoke with patient for overview of new oral chemotherapy medication: Sprycel. Pt is doing well. The prescription has been sent to the [place] for benefit analysis and approval.   Counseled patient on administration, dosing, side effects, safe handling, and monitoring. Side effects include but not limited to: Myelosuppression, fatigue, headache, rash, bruising.  Mr. Saleem voiced understanding and appreciation.   All questions answered.  Will follow up with patient regarding insurance and pharmacy/copay. Will follow up in 1-2 weeks for adherence and toxicity management.   Thank you,  Montel Clock, PharmD, [facility]

## 2015-06-26 NOTE — Progress Notes (Signed)
[place] OFFICE PROGRESS NOTE  Patient Care Team: Rowe Clack, MD as PCP - General Belva Crome, MD as Consulting Physician (Cardiology) Suella Broad, MD as Consulting Physician (Physical Medicine and Rehabilitation) Deboraha Sprang, MD as Consulting Physician (Cardiology) Gatha Mayer, MD as Consulting Physician (Gastroenterology) Irine Seal, MD as Consulting Physician (Urology) Melida Quitter, MD as Consulting Physician (Otolaryngology) Susa Day, MD (Orthopedic Surgery) Deneise Lever, MD (Pulmonary Disease) Carol Ada, MD (Gastroenterology)  SUMMARY OF ONCOLOGIC HISTORY:   CML (chronic myeloid leukemia) ([place])   06/19/2015 Pathology Results Peripheral blood was positive for BCR/ABL at 45.49% a1a2 and on IS 37.76%   06/20/2015 Bone Marrow Biopsy Accession: [id number] BM biopsy confirmed myeloproliferative disorder    INTERVAL HISTORY: Please see below for problem oriented charting. He returns for further follow-up. He tolerated bone marrow biopsy well.  REVIEW OF SYSTEMS:   Constitutional: Denies fevers, chills or abnormal weight loss Eyes: Denies blurriness of vision Ears, nose, mouth, throat, and face: Denies mucositis or sore throat Respiratory: Denies cough, dyspnea or wheezes Cardiovascular: Denies palpitation, chest discomfort or lower extremity swelling Gastrointestinal:  Denies nausea, heartburn or change in bowel habits Skin: Denies abnormal skin rashes Lymphatics: Denies new lymphadenopathy or easy bruising Neurological:Denies numbness, tingling or new weaknesses Behavioral/Psych: Mood is stable, no new changes  All other systems were reviewed with the patient and are negative.  I have reviewed the past medical history, past surgical history, social history and family history with the patient and they are unchanged from previous note.  ALLERGIES:  is allergic to actos; celebrex; demerol; morphine and related; ciprofloxacin; metformin;  and zocor.  MEDICATIONS:  Current Outpatient Prescriptions  Medication Sig Dispense Refill  . albuterol (PROVENTIL HFA) 108 (90 BASE) MCG/ACT inhaler Inhale 2 puffs into the lungs every 4 (four) hours as needed for shortness of breath. Wheezing     . albuterol (PROVENTIL) (2.5 MG/3ML) 0.083% nebulizer solution USE 1 VIAL WITH NEBULIZER EVERY 4 HOURS AS NEEDED FOR WHEEZING 75 mL 2  . dasatinib (SPRYCEL) 70 MG tablet Take 1 tablet (70 mg total) by mouth daily. 30 tablet 9  . fluticasone (FLONASE) 50 MCG/ACT nasal spray Place 2 sprays into both nostrils daily. Allergies 16 g 1  . glucose blood (ONE TOUCH TEST STRIPS) test strip 1 each by Other route 2 (two) times daily. Use to check blood sugars twice a day E11.9 100 each 3  . nitroGLYCERIN (NITROSTAT) 0.4 MG SL tablet Place 0.4 mg under the tongue every 5 (five) minutes as needed for chest pain. Chest pain     . Polyethyl Glycol-Propyl Glycol (SYSTANE) 0.4-0.3 % SOLN Apply 2 drops to eye 4 (four) times daily.     . pravastatin (PRAVACHOL) 20 MG tablet Take 3 tablets (60 mg total) by mouth daily. 270 tablet 3  . valsartan (DIOVAN) 160 MG tablet Take 1 tablet (160 mg total) by mouth daily. 90 tablet 3   No current facility-administered medications for this visit.    PHYSICAL EXAMINATION: ECOG PERFORMANCE STATUS: 1 - Symptomatic but completely ambulatory  Filed Vitals:   06/26/15 1151  BP: 146/62  Pulse: 77  Temp: 98 F (36.7 C)  Resp: 19   Filed Weights   06/26/15 1151  Weight: 218 lb 11.2 oz (99.202 kg)    GENERAL:alert, no distress and comfortable SKIN: skin color, texture, turgor are normal, no rashes or significant lesions EYES: normal, Conjunctiva are pink and non-injected, sclera clear Musculoskeletal:no cyanosis of digits and no  clubbing  NEURO: alert & oriented x 3 with fluent speech, no focal motor/sensory deficits  LABORATORY DATA:  I have reviewed the data as listed    Component Value Date/Time   NA 127*  05/27/2015 1512   NA 135* 04/11/2014   K 4.8 05/27/2015 1512   CL 94* 05/27/2015 1512   CO2 27 05/27/2015 1512   GLUCOSE 107* 05/27/2015 1512   BUN 15 05/27/2015 1512   BUN 11 04/11/2014   CREATININE 0.83 05/27/2015 1512   CREATININE 1.0 04/11/2014   CALCIUM 10.9* 05/27/2015 1512   PROT 7.3 05/27/2015 1512   ALBUMIN 4.1 05/27/2015 1512   AST 23 05/27/2015 1512   ALT 15 05/27/2015 1512   ALKPHOS 95 05/27/2015 1512   BILITOT 0.6 05/27/2015 1512   GFRNONAA 53* 12/10/2014 0530   GFRAA >60 12/10/2014 0530    No results found for: SPEP, UPEP  Lab Results  Component Value Date   WBC 30.2* 06/19/2015   NEUTROABS 26.6* 05/29/2015   HGB 12.7* 06/19/2015   HCT 39.8 06/19/2015   MCV 92.6 06/19/2015   PLT 246 06/19/2015      Chemistry      Component Value Date/Time   NA 127* 05/27/2015 1512   NA 135* 04/11/2014   K 4.8 05/27/2015 1512   CL 94* 05/27/2015 1512   CO2 27 05/27/2015 1512   BUN 15 05/27/2015 1512   BUN 11 04/11/2014   CREATININE 0.83 05/27/2015 1512   CREATININE 1.0 04/11/2014   GLU 158 04/11/2014      Component Value Date/Time   CALCIUM 10.9* 05/27/2015 1512   ALKPHOS 95 05/27/2015 1512   AST 23 05/27/2015 1512   ALT 15 05/27/2015 1512   BILITOT 0.6 05/27/2015 1512      ASSESSMENT & PLAN:  CML (chronic myeloid leukemia) ([place]) I had a long discussion with the patient and his daughter. Recent bone marrow biopsy confirmed the patient have CML. I discussed with him and family the pathophysiology and behavior of CML. This is considered a highly treatable disease with tyrosine kinase inhibitor. With his age and other comorbidities, I plan to start him on reduced dose Sprycel. The risks, benefits, side effects of Sprycel were fully discussed with the patient and family member and they agreed to proceed. I will coordinate his prescription with my pharmacists. Hopefully, he can get his treatment started by 07/01/2015. If he can scan it started by then, I will  bring him in every week for blood draw monitoring and see him back at the end of February for toxicity review. He agreed with the plan of care.   No orders of the defined types were placed in this encounter.   All questions were answered. The patient knows to call the clinic with any problems, questions or concerns. No barriers to learning was detected. I spent 25 minutes counseling the patient face to face. The total time spent in the appointment was 30 minutes and more than 50% was on counseling and review of test results     [facility], [place], MD 06/26/2015 3:38 PM

## 2015-06-26 NOTE — Assessment & Plan Note (Signed)
I had a long discussion with the patient and his daughter. Recent bone marrow biopsy confirmed the patient have CML. I discussed with him and family the pathophysiology and behavior of CML. This is considered a highly treatable disease with tyrosine kinase inhibitor. With his age and other comorbidities, I plan to start him on reduced dose Sprycel. The risks, benefits, side effects of Sprycel were fully discussed with the patient and family member and they agreed to proceed. I will coordinate his prescription with my pharmacists. Hopefully, he can get his treatment started by 07/01/2015. If he can scan it started by then, I will bring him in every week for blood draw monitoring and see him back at the end of February for toxicity review. He agreed with the plan of care.

## 2015-06-27 MED FILL — SPRYCEL 70 MG TABLET: 70 | 30 days supply | Qty: 30 | Fill #0

## 2015-06-28 LAB — TISSUE HYBRIDIZATION (BONE MARROW)-NCBH

## 2015-06-28 LAB — CHROMOSOME ANALYSIS, BONE MARROW

## 2015-07-01 ENCOUNTER — Other Ambulatory Visit: Payer: Self-pay | Admitting: Hematology and Oncology

## 2015-07-01 ENCOUNTER — Encounter: Payer: Self-pay | Admitting: Internal Medicine

## 2015-07-01 ENCOUNTER — Ambulatory Visit (INDEPENDENT_AMBULATORY_CARE_PROVIDER_SITE_OTHER): Payer: Medicare Other | Admitting: Internal Medicine

## 2015-07-01 ENCOUNTER — Ambulatory Visit: Payer: Medicare Other | Admitting: Hematology and Oncology

## 2015-07-01 VITALS — BP 148/64 | HR 83 | Temp 98.5°F | Resp 12 | Ht 66.0 in | Wt 222.0 lb

## 2015-07-01 DIAGNOSIS — I2510 Atherosclerotic heart disease of native coronary artery without angina pectoris: Secondary | ICD-10-CM

## 2015-07-01 DIAGNOSIS — C9210 Chronic myeloid leukemia, BCR/ABL-positive, not having achieved remission: Secondary | ICD-10-CM

## 2015-07-01 DIAGNOSIS — I110 Hypertensive heart disease with heart failure: Secondary | ICD-10-CM | POA: Diagnosis not present

## 2015-07-01 DIAGNOSIS — J449 Chronic obstructive pulmonary disease, unspecified: Secondary | ICD-10-CM

## 2015-07-01 DIAGNOSIS — I5032 Chronic diastolic (congestive) heart failure: Secondary | ICD-10-CM

## 2015-07-01 MED ORDER — RANITIDINE HCL 150 MG PO TABS: 150.0000 mg | ORAL_TABLET | Freq: Two times a day (BID) | ORAL | Status: DC

## 2015-07-01 MED ORDER — ISOSORBIDE MONONITRATE ER 30 MG PO TB24: 30.0000 mg | ORAL_TABLET | Freq: Every evening | ORAL | Status: DC

## 2015-07-01 NOTE — Progress Notes (Signed)
Pre visit review using our clinic review tool, if applicable. No additional management support is needed unless otherwise documented below in the visit note. 

## 2015-07-01 NOTE — Patient Instructions (Signed)
We have updated your medicine list and given you a letter for the [place] with your medicines on it.   We would like you to start taking the imdur again.

## 2015-07-02 ENCOUNTER — Telehealth: Payer: Self-pay | Admitting: Hematology and Oncology

## 2015-07-02 NOTE — Assessment & Plan Note (Signed)
Not in flare today. On imdur and valsartan and statin. Uses nitro prn.

## 2015-07-02 NOTE — Assessment & Plan Note (Signed)
Will restart his imdur at 30 mg daily for prevention of his chest pains. He is also on ARB and statin. BP at goal off chlorthalidone and will not resume as it is likely that this was causing some of the dizziness. Monitor and adjust as needed.

## 2015-07-02 NOTE — Progress Notes (Signed)
   Subjective:    Patient ID: Eric [name], male    DOB: Mar 08, 1927, 82 y.o.   MRN: [id number]  HPI The patient is an 82 YO man coming in for follow up of his blood pressure. Last visit he had stopped taking his imdur and chlorthalidone since he felt they were making him dizzy. Since that time he has been referred to hematology with abnormal labs and diagnosed with CML. He now thinks that this was causing his dizziness and wants to see if he needs to restart any of his medicines. He is having some mild chest pains since stopping the imdur. Maybe twice a week lasting 1-2 minutes. Did not have those on the imdur.   Review of Systems  Constitutional: Negative for fever, chills, activity change, appetite change and unexpected weight change.  HENT: Positive for congestion. Negative for postnasal drip, rhinorrhea and sinus pressure.   Eyes: Negative.   Respiratory: Positive for cough. Negative for chest tightness, shortness of breath and wheezing.        Improving  Cardiovascular: Negative for chest pain, palpitations and leg swelling.  Gastrointestinal: Negative for nausea, vomiting, abdominal pain, diarrhea, constipation and abdominal distention.  Musculoskeletal: Negative.   Neurological: Negative for dizziness, seizures, weakness, numbness and headaches.     Objective:   Physical Exam  Constitutional: He is oriented to person, place, and time. He appears well-developed and well-nourished.  HENT:  Head: Normocephalic and atraumatic.  Right Ear: External ear normal.  Left Ear: External ear normal.  Mouth/Throat: Oropharynx is clear and moist.  Eyes: EOM are normal.  Neck: Normal range of motion.  Cardiovascular: Normal rate and regular rhythm.   Pulmonary/Chest: Effort normal. No respiratory distress. He has no wheezes. He has no rales. He exhibits no tenderness.  Abdominal: Soft. He exhibits no distension. There is no tenderness. There is no rebound.  Musculoskeletal: He exhibits no  edema.  Neurological: He is alert and oriented to person, place, and time. Coordination normal.  Skin: Skin is warm and dry.  Psychiatric: He has a normal mood and affect.   Filed Vitals:   07/01/15 1524  BP: 148/64  Pulse: 83  Temp: 98.5 F (36.9 C)  TempSrc: Oral  Resp: 12  Height: 5\' 6"  (1.676 m)  Weight: 222 lb (100.699 kg)  SpO2: 96%      Assessment & Plan:

## 2015-07-02 NOTE — Assessment & Plan Note (Signed)
At this time starting treatment with dasatinib and he is not sure he will be able to continue since it is very expensive. He will talk to his oncologist about this.

## 2015-07-02 NOTE — Assessment & Plan Note (Signed)
Flare is resolved and no new flare today.

## 2015-07-02 NOTE — Telephone Encounter (Signed)
Called and left a message with new lab appointments °

## 2015-07-09 ENCOUNTER — Telehealth: Payer: Self-pay | Admitting: Internal Medicine

## 2015-07-09 ENCOUNTER — Other Ambulatory Visit (HOSPITAL_BASED_OUTPATIENT_CLINIC_OR_DEPARTMENT_OTHER): Payer: Medicare Other

## 2015-07-09 ENCOUNTER — Telehealth: Payer: Self-pay | Admitting: *Deleted

## 2015-07-09 DIAGNOSIS — C9210 Chronic myeloid leukemia, BCR/ABL-positive, not having achieved remission: Secondary | ICD-10-CM | POA: Diagnosis present

## 2015-07-09 LAB — CBC WITH DIFFERENTIAL/PLATELET
BASO%: 4.4 % — AB (ref 0.0–2.0)
Basophils Absolute: 0.4 10*3/uL — ABNORMAL HIGH (ref 0.0–0.1)
EOS%: 1.5 % (ref 0.0–7.0)
Eosinophils Absolute: 0.1 10*3/uL (ref 0.0–0.5)
HCT: 36.1 % — ABNORMAL LOW (ref 38.4–49.9)
HGB: 11.8 g/dL — ABNORMAL LOW (ref 13.0–17.1)
LYMPH%: 12.4 % — AB (ref 14.0–49.0)
MCH: 30.8 pg (ref 27.2–33.4)
MCHC: 32.6 g/dL (ref 32.0–36.0)
MCV: 94.4 fL (ref 79.3–98.0)
MONO#: 1.2 10*3/uL — ABNORMAL HIGH (ref 0.1–0.9)
MONO%: 13.1 % (ref 0.0–14.0)
NEUT#: 6.1 10*3/uL (ref 1.5–6.5)
NEUT%: 68.6 % (ref 39.0–75.0)
Platelets: 252 10*3/uL (ref 140–400)
RBC: 3.83 10*6/uL — AB (ref 4.20–5.82)
RDW: 14.4 % (ref 11.0–14.6)
WBC: 8.9 10*3/uL (ref 4.0–10.3)
lymph#: 1.1 10*3/uL (ref 0.9–3.3)

## 2015-07-09 LAB — COMPREHENSIVE METABOLIC PANEL
ALT: 34 U/L (ref 0–55)
AST: 37 U/L — AB (ref 5–34)
Albumin: 3.9 g/dL (ref 3.5–5.0)
Alkaline Phosphatase: 75 U/L (ref 40–150)
Anion Gap: 8 mEq/L (ref 3–11)
BUN: 13.6 mg/dL (ref 7.0–26.0)
CHLORIDE: 107 meq/L (ref 98–109)
CO2: 23 meq/L (ref 22–29)
CREATININE: 1.1 mg/dL (ref 0.7–1.3)
Calcium: 9.8 mg/dL (ref 8.4–10.4)
EGFR: 62 mL/min/{1.73_m2} — ABNORMAL LOW (ref >90)
GLUCOSE: 123 mg/dL (ref 70–140)
Potassium: 4.2 mEq/L (ref 3.5–5.1)
SODIUM: 138 meq/L (ref 136–145)
Total Bilirubin: 0.75 mg/dL (ref 0.20–1.20)
Total Protein: 6.9 g/dL (ref 6.4–8.3)

## 2015-07-09 NOTE — Telephone Encounter (Signed)
Informed pt of Dr. Calton Dach message below.  He verbalized understanding.   He c/o Light Yellow Non odorous discharge from his rectum several times a day for several weeks.   He says he was told after his Colostomy that he would not have any discharge from his rectum.  He has called his Surgeon and his Surgeon told him to call GI.  He has a call into [facility] but says he is frustrated because he hasn't heard back from them yet.  He asks if Sprycel can cause this? He also c/o weight gain,  Feels like he is retaining fluid.  Wants to know if ok to resume a "fluid pill" that his PCP took him off of last year?  Pt has appt to see his Cardiologist,  Dr. Tamala Julian, tomorrow.  Instructed pt to address his fluid retention and "fluid pill" question w/ Dr. Tamala Julian tomorrow.   Informed him I will ask Dr. Alvy Bimler about his discharge and also offered to call [facility] on his behalf for appt. After I talk w/ Dr. Alvy Bimler.  He verbalized understanding.

## 2015-07-09 NOTE — Telephone Encounter (Signed)
Informed pt of Dr. Calton Dach reply.  He has appt now to see Dr. Carlean Purl.  Informed pt to address the problem of rectal discharge w/ Dr. Carlean Purl.  Also told him ok w/ Dr. Alvy Bimler for pt to take fluid pill, but it would be good to discuss w/ Dr. Tamala Julian tomorrow since he is seeing him.  Pt verbalized understanding.

## 2015-07-09 NOTE — Telephone Encounter (Signed)
Patient is scheduled to be seen on 07/16/15

## 2015-07-09 NOTE — Telephone Encounter (Signed)
OK to resume fluid pill Not sure about rectal discharge. Sprycel does not cause this

## 2015-07-09 NOTE — Telephone Encounter (Signed)
-----   Message from Heath Lark, MD sent at 07/09/2015 12:15 PM EST ----- Regarding: labs pls let him know labs look good Continue the same  ----- Message -----    From: [facility]    Sent: 07/09/2015  11:38 AM      To: Heath Lark, MD

## 2015-07-10 ENCOUNTER — Encounter: Payer: Self-pay | Admitting: Interventional Cardiology

## 2015-07-10 ENCOUNTER — Ambulatory Visit (INDEPENDENT_AMBULATORY_CARE_PROVIDER_SITE_OTHER): Payer: Medicare Other | Admitting: Interventional Cardiology

## 2015-07-10 VITALS — BP 136/62 | HR 74 | Ht 66.0 in | Wt 229.6 lb

## 2015-07-10 DIAGNOSIS — Z9049 Acquired absence of other specified parts of digestive tract: Secondary | ICD-10-CM

## 2015-07-10 DIAGNOSIS — I10 Essential (primary) hypertension: Secondary | ICD-10-CM

## 2015-07-10 DIAGNOSIS — I2510 Atherosclerotic heart disease of native coronary artery without angina pectoris: Secondary | ICD-10-CM | POA: Diagnosis not present

## 2015-07-10 DIAGNOSIS — K219 Gastro-esophageal reflux disease without esophagitis: Secondary | ICD-10-CM | POA: Diagnosis not present

## 2015-07-10 DIAGNOSIS — I495 Sick sinus syndrome: Secondary | ICD-10-CM | POA: Diagnosis not present

## 2015-07-10 DIAGNOSIS — C9210 Chronic myeloid leukemia, BCR/ABL-positive, not having achieved remission: Secondary | ICD-10-CM

## 2015-07-10 DIAGNOSIS — I5032 Chronic diastolic (congestive) heart failure: Secondary | ICD-10-CM

## 2015-07-10 DIAGNOSIS — I6523 Occlusion and stenosis of bilateral carotid arteries: Secondary | ICD-10-CM | POA: Diagnosis not present

## 2015-07-10 MED ORDER — HYDROCHLOROTHIAZIDE 12.5 MG PO CAPS: 12.5000 mg | ORAL_CAPSULE | Freq: Every day | ORAL | Status: DC

## 2015-07-10 NOTE — Patient Instructions (Addendum)
Medication Instructions:  Your physician has recommended you make the following change in your medication:  START HCTZ 12.5mg  daily. An Rx has been sent to your pharmacy  Labwork: Your physician recommends that you return for lab work in: 2-3 weeks ([facility])   Testing/Procedures: None ordered  Follow-Up: Your physician recommends that you schedule a follow-up appointment in: 3-4 weeks with an APP   Your physician wants you to follow-up in: 1 year with Dr.Smith You will receive a reminder letter in the mail two months in advance. If you don't receive a letter, please call our office to schedule the follow-up appointment.   Any Other Special Instructions Will Be Listed Below (If Applicable).     If you need a refill on your cardiac medications before your next appointment, please call your pharmacy.

## 2015-07-10 NOTE — Progress Notes (Signed)
Cardiology Office Note   Date:  07/10/2015   ID:  Eric [name], DOB [birth year], MRN [id number]  PCP:  [name], MD  Cardiologist:  Eric Grooms, MD   Chief Complaint  Patient presents with  . Coronary Artery Disease      History of Present Illness: Eric [name] is a 82 y.o. male who presents for  Tachybradycardia syndrome, permanent pacemaker, carotid artery disease, coronary artery disease with DES 2008, 2009, and 2012. History of CABG 1995.   Since I last saw the patient he has had multiple medical problems. He developed a diverticular abscess and now is status post colectomy with colostomy. More recently he has been diagnosed with CML and is being followed by Dr. Alvy Bimler. He is on specific therapy for the CML ( Sprycel). He has not had angina. He denies orthopnea and PND. He is somewhat depressed about the colectomy and colostomy. He has gained weight and complains of lower extremity swelling. There is mild orthopnea. Prior to discontinuation of chlorthalidone he was complaining of dizziness which is resolved.    Past Medical History  Diagnosis Date  . LACTOSE INTOLERANCE   . OBESITY   . CARDIOMYOPATHY, ISCHEMIC   . AORTIC SCLEROSIS   . SICK SINUS/ TACHY-BRADY SYNDROME 09/2007    s/p PPM st judes  . PERIPHERAL VASCULAR DISEASE   . CAROTID BRUIT, RIGHT 02/27/2008  . IBS (irritable bowel syndrome)   . ALLERGIC RHINITIS   . ANEMIA-NOS   . OA (osteoarthritis)   . COPD   . GERD   . HYPERLIPIDEMIA   . HIATAL HERNIA   . Diverticulosis   . Prostate cancer ([place])     seed implants 2004  . DIABETES MELLITUS-TYPE II     diet controlled  . CORONARY ARTERY DISEASE     CABG 1995, PTCA/DES 2008, 2009 and 08/2010  . HYPERTENSION   . Asthma   . Sleep apnea   . Partial small bowel obstruction ([place])   . Primary hyperparathyroidism ([place])     Lab Results Component Value Date  PTH 150.7* 02/13/2013  CALCIUM 11.0* 02/13/2013  CAION 1.21 03/15/2008    . SMALL BOWEL  OBSTRUCTION 04/18/2009    Qualifier: History of  By: Asa Lente MD, Jannifer Rodney Cataract     surgery  . Symptomatic diverticulosis 01/18/2009    Qualifier: Diagnosis of  By: Trellis Paganini PA-c, Amy S   . Hx of echocardiogram     Echo (9/15):  Mild LVH, EF 50-55%, no RWMA, Gr 1 DD, MAC, mild LAE.  . Diastolic dysfunction, Grade 1 11/24/2014  . Diverticulitis of colon with perforation 11/22/2014  . Hyponatremia 11/22/2014  . CML (chronic myeloid leukemia) ([place]) 06/26/2015    Past Surgical History  Procedure Laterality Date  . Ptca  2008, 2009, 2012    with DES  . Partial small bowel obstruction  2009  . Bilateral cataracts    . Pacemaker insertion      DDD/[employer]         Last interrogation 2/13  on chart     Pacemaker guideline order Dr Tamala Julian on chart  . Inguinal hernia repair Bilateral   . Lumbar disc surgery  12/2008  . Coronary artery bypass graft    . Total hip arthroplasty  08/21/2011    Procedure: TOTAL HIP ARTHROPLASTY;  Surgeon: Johnn Hai, MD;  Location: [facility];  Service: Orthopedics;  Laterality: Right;  . Colonoscopy    .  Esophagogastroduodenoscopy  multiple  . Penile prosthesis placement    . Back surgery    . Colon surgery    . Flexible sigmoidoscopy N/A 09/22/2013    Procedure: FLEXIBLE SIGMOIDOSCOPY;  Surgeon: Gatha Mayer, MD;  Location: [facility];  Service: Endoscopy;  Laterality: N/A;  . Colon resection N/A 11/28/2014    Procedure: EXPLORATORY LAPAROTOMY, SIGMOID COLECTOMY WITH COLOSTOMY;  Surgeon: Jackolyn Confer, MD;  Location: [facility];  Service: General;  Laterality: N/A;     Current Outpatient Prescriptions  Medication Sig Dispense Refill  . albuterol (PROVENTIL HFA) 108 (90 BASE) MCG/ACT inhaler Inhale 2 puffs into the lungs every 4 (four) hours as needed for shortness of breath. Wheezing     . albuterol (PROVENTIL) (2.5 MG/3ML) 0.083% nebulizer solution USE 1 VIAL WITH NEBULIZER EVERY 4 HOURS AS NEEDED FOR WHEEZING 75 mL 2  . aspirin 81 MG tablet  Take 81 mg by mouth daily.    . dasatinib (SPRYCEL) 70 MG tablet Take 1 tablet (70 mg total) by mouth daily. 30 tablet 9  . fluticasone (FLONASE) 50 MCG/ACT nasal spray Place 2 sprays into both nostrils daily. Allergies 16 g 1  . glucose blood (ONE TOUCH TEST STRIPS) test strip 1 each by Other route 2 (two) times daily. Use to check blood sugars twice a day E11.9 100 each 3  . isosorbide mononitrate (IMDUR) 30 MG 24 hr tablet Take 1 tablet (30 mg total) by mouth every evening. 30 tablet 11  . nitroGLYCERIN (NITROSTAT) 0.4 MG SL tablet Place 0.4 mg under the tongue every 5 (five) minutes as needed for chest pain. Chest pain     . Polyethyl Glycol-Propyl Glycol (SYSTANE) 0.4-0.3 % SOLN Apply 2 drops to eye 4 (four) times daily.     . pravastatin (PRAVACHOL) 20 MG tablet Take 3 tablets (60 mg total) by mouth daily. 270 tablet 3  . ranitidine (ZANTAC) 300 MG tablet Take 300 mg by mouth 2 (two) times daily.    . valsartan (DIOVAN) 160 MG tablet Take 1 tablet (160 mg total) by mouth daily. 90 tablet 3   No current facility-administered medications for this visit.    Allergies:   Actos; Celebrex; Demerol; Morphine and related; Ciprofloxacin; Metformin; and Zocor    Social History:  The patient  reports that he quit smoking about 21 years ago. His smoking use included Cigarettes. He has a 25 pack-year smoking history. He has never used smokeless tobacco. He reports that he does not drink alcohol or use illicit drugs.   Family History:  The patient's family history includes Cancer in his mother; Heart attack in his brother; Hypertension in his mother. There is no history of Colon cancer or Stroke.    ROS:  Please see the history of present illness.   Otherwise, review of systems are positive for  Snoring, wheezing, orthopnea, difficulty with balance, bleeding, easy bruising, cough, and dizziness..   All other systems are reviewed and negative.    PHYSICAL EXAM: VS:  BP 136/62 mmHg  Pulse 74  Ht  5\' 6"  (1.676 m)  Wt 229 lb 9.6 oz (104.146 kg)  BMI 37.08 kg/m2 , BMI Body mass index is 37.08 kg/(m^2). GEN: Well nourished, well developed, in no acute distress HEENT: normal Neck: no JVD, carotid bruits, or masses Cardiac: RRR.  There is no murmur, rub, or gallop. There is no edema. Respiratory:  clear to auscultation bilaterally, normal work of breathing. GI: soft, nontender, nondistended, + BS MS: no deformity or atrophy  Skin: warm and dry, no rash Neuro:  Strength and sensation are intact Psych: euthymic mood, full affect   EKG:  EKG is  Not ordered today.   Recent Labs: 11/22/2014: Magnesium 1.9; TSH 0.672 12/10/2014: B Natriuretic Peptide 32.7 05/27/2015: Pro B Natriuretic peptide (BNP) 80.0 07/09/2015: ALT 34; BUN 13.6; Creatinine 1.1; HGB 11.8*; Platelets 252; Potassium 4.2; Sodium 138    Lipid Panel    Component Value Date/Time   CHOL 108 05/27/2015 1512   TRIG 151.0* 05/27/2015 1512   TRIG 70 01/06/2010   HDL 22.70* 05/27/2015 1512   CHOLHDL 5 05/27/2015 1512   VLDL 30.2 05/27/2015 1512   LDLCALC 55 05/27/2015 1512      Wt Readings from Last 3 Encounters:  07/10/15 229 lb 9.6 oz (104.146 kg)  07/01/15 222 lb (100.699 kg)  06/26/15 218 lb 11.2 oz (99.202 kg)      Other studies Reviewed: Additional studies/ records that were reviewed today include:  Reviewed records from hematology oncology. Review the hospital stay related to diverticular abscess.. The findings include  Recent laboratory data was reviewed..    ASSESSMENT AND PLAN:  1. Atherosclerosis of native coronary artery of native heart without angina pectoris  asymptomatic  2. Tachy-brady syndrome (HCC)  stable  3. Bilateral carotid artery stenosis  asymptomatic  4. Essential hypertension   controlled   5. Chronic diastolic heart failure,  with mild volume overload   6. Colectomy for diverticulitis/diverticular abscess    7. CML , followed by Dr. Alvy Bimler   Current medicines are  reviewed at length with the patient today.  The patient has the following concerns regarding medicines:  Decision about whether to resume diuretic therapy. My suspicion is mild volume overload and therefore will resume very low dose hydrochlorothiazide the subsequent clinical follow-up..  The following changes/actions have been instituted:     add hydrochlorothiazide 12.5 mg daily. He preferred a separate prescription rather than having it as a combination pill with valsartan.   Basic metabolic panel in 2-3 weeks   Follow-up in 2-3 weeks to assess for responsive volume overload on low-dose diuretic therapy  Labs/ tests ordered today include:  No orders of the defined types were placed in this encounter.     Disposition:   FU with HS in 1 years  Signed, Eric Grooms, MD  07/10/2015 12:07 PM    [address] [place], [place], [place]  [address] Phone: [phone]; Fax: [phone]

## 2015-07-12 ENCOUNTER — Telehealth: Payer: Self-pay | Admitting: *Deleted

## 2015-07-12 NOTE — Telephone Encounter (Signed)
Pt left VM w/ name of his [facility] MD in [place].  He asks if we can fax over documentation regarding Sprycel and reason for drug to see if they will pay for it?  His MD is Dr. Anson Fret and Fax [phone].

## 2015-07-12 NOTE — Telephone Encounter (Signed)
Faxed Dr. Calton Dach office note to Dr. Hortencia Conradi at [facility] in [place].

## 2015-07-16 ENCOUNTER — Encounter: Payer: Self-pay | Admitting: Internal Medicine

## 2015-07-16 ENCOUNTER — Ambulatory Visit (INDEPENDENT_AMBULATORY_CARE_PROVIDER_SITE_OTHER): Payer: Medicare Other | Admitting: Internal Medicine

## 2015-07-16 VITALS — BP 140/84 | HR 66 | Ht 66.0 in | Wt 230.8 lb

## 2015-07-16 DIAGNOSIS — I6523 Occlusion and stenosis of bilateral carotid arteries: Secondary | ICD-10-CM | POA: Diagnosis not present

## 2015-07-16 DIAGNOSIS — K9403 Colostomy malfunction: Secondary | ICD-10-CM

## 2015-07-16 DIAGNOSIS — R194 Change in bowel habit: Secondary | ICD-10-CM

## 2015-07-16 NOTE — Progress Notes (Signed)
   Subjective:    Patient ID: Eric [name], male    DOB: [birth year], 82 y.o.   MRN: [id number] Cc: stool leakage HPI Eric [name] had  A colostomy and Hartmann's pouch for perforated diverticulitis last year. He has onoted some intermittent leakage of mucoid material from rectum at times. Also more concerned about irregular emptying habits of his colostomy - he is limiting travel and activities due to fears of having to empty it when out of the house. No pain. Says he has a hernia near the colostomy and thinks that may be part of the problem. Has used loperamide at times to control or reduce chances of the colostomy "filling up". Medications, allergies, past medical history, past surgical history, family history and social history are reviewed and updated in the EMR.  Review of Systems As above, on dastinib for "bone marrow cancer"    Objective:   Physical Exam BP 140/84 mmHg  Pulse 66  Ht 5\' 6"  (1.676 m)  Wt 230 lb 12.8 oz (104.69 kg)  BMI 37.27 kg/m2 NAD Ostomy in LLQ - appliance in place - there is a slight bulge at base - not sure this is a hernia     Assessment & Plan:  Altered bowel habits  Colostomy dysfunction ([place])  I think this may be a normal variant re the colostomy filling. He will see the ostomy RN at [facility] and hopefully she will help. Intermittent mucoid dc from rectum is not uncommon - he is not too worried and will use a pad.  I am not convinced he has a true peristomal hernia.  He will see me prn  15 minutes time spent with patient > half in counseling coordination of care

## 2015-07-16 NOTE — Patient Instructions (Addendum)
  Please go over to [facility] located at [address], [place] now and the [facility] can hopefully help you.  Their phone # is [phone].     Follow up with [place] as needed.     I appreciate the opportunity to care for you. Silvano Rusk, MD, [facility]

## 2015-07-17 ENCOUNTER — Telehealth: Payer: Self-pay | Admitting: *Deleted

## 2015-07-17 ENCOUNTER — Telehealth: Payer: Self-pay | Admitting: Hematology and Oncology

## 2015-07-17 NOTE — Telephone Encounter (Signed)
Appointment made per 2/8 pof for 2/9. Patient aware

## 2015-07-17 NOTE — Telephone Encounter (Signed)
"  I started Sprycell two weeks ago.  Day before yesterday I woke up with a few bumps.  Started breaking out itching really bad.  I'm very uncomfortable.  Using hydrocortisone 1% cream.  Red raised rash with whelps to my legs, arms, under armpit.  I have two things going on with winter on my hands from keeping hands in hot water too long.  I run cold water over my hands to help this.  Use [facility] on [address]  Return number [phone].

## 2015-07-17 NOTE — Telephone Encounter (Signed)
Called Eric [name] who says he will come in tomorrow at 10:45.  P.O.F. Generated.

## 2015-07-17 NOTE — Telephone Encounter (Signed)
Can you call to see if he can come in tomorrow at around 1045 so I can take a look?

## 2015-07-18 ENCOUNTER — Encounter: Payer: Self-pay | Admitting: Hematology and Oncology

## 2015-07-18 ENCOUNTER — Ambulatory Visit (HOSPITAL_BASED_OUTPATIENT_CLINIC_OR_DEPARTMENT_OTHER): Payer: Medicare Other | Admitting: Hematology and Oncology

## 2015-07-18 VITALS — BP 137/54 | HR 59 | Temp 97.8°F | Resp 17 | Ht 66.0 in | Wt 222.3 lb

## 2015-07-18 DIAGNOSIS — D638 Anemia in other chronic diseases classified elsewhere: Secondary | ICD-10-CM

## 2015-07-18 DIAGNOSIS — C9210 Chronic myeloid leukemia, BCR/ABL-positive, not having achieved remission: Secondary | ICD-10-CM | POA: Diagnosis not present

## 2015-07-18 DIAGNOSIS — L509 Urticaria, unspecified: Secondary | ICD-10-CM

## 2015-07-18 NOTE — Assessment & Plan Note (Signed)
He has excellent response to treatment with normalization of his leukocytosis.  I reassured the patient at the skin rashes not related to Sprycel. We will continue same treatment.

## 2015-07-18 NOTE — Assessment & Plan Note (Signed)
This is likely anemia of chronic disease. The patient denies recent history of bleeding such as epistaxis, hematuria or hematochezia. He is asymptomatic from the anemia. We will observe for now.  He does not require transfusion now.   

## 2015-07-18 NOTE — Progress Notes (Signed)
[facility] OFFICE PROGRESS NOTE  Patient Care Team: [name], MD as PCP - General (Internal Medicine) Belva Crome, MD as Consulting Physician (Cardiology) Suella Broad, MD as Consulting Physician (Physical Medicine and Rehabilitation) Deboraha Sprang, MD as Consulting Physician (Cardiology) Gatha Mayer, MD as Consulting Physician (Gastroenterology) Irine Seal, MD as Consulting Physician (Urology) Melida Quitter, MD as Consulting Physician (Otolaryngology) Susa Day, MD (Orthopedic Surgery) Deneise Lever, MD (Pulmonary Disease) Carol Ada, MD (Gastroenterology) Allyn Kenner, MD (Dermatology)  SUMMARY OF ONCOLOGIC HISTORY:   CML (chronic myeloid leukemia) ([place])   06/19/2015 Pathology Results Peripheral blood was positive for BCR/ABL at 45.49% a1a2 and on IS 37.76%   06/20/2015 Bone Marrow Biopsy Accession: [id number] BM biopsy confirmed CML. Cytogenetics showed 9;75 translocation and deletion Y   06/26/2015 -  Chemotherapy He is started on Sprycel for CML     INTERVAL HISTORY: Please see below for problem oriented charting. He is seen because of significant skin rashes that started several days ago. They are intensely itchy.  he denies worsening shortness of breath or cough. Denies fluid retention or diarrhea.  REVIEW OF SYSTEMS:   Constitutional: Denies fevers, chills or abnormal weight loss Eyes: Denies blurriness of vision Ears, nose, mouth, throat, and face: Denies mucositis or sore throat Respiratory: Denies cough, dyspnea or wheezes Cardiovascular: Denies palpitation, chest discomfort or lower extremity swelling Gastrointestinal:  Denies nausea, heartburn or change in bowel habits Lymphatics: Denies new lymphadenopathy or easy bruising Neurological:Denies numbness, tingling or new weaknesses Behavioral/Psych: Mood is stable, no new changes  All other systems were reviewed with the patient and are negative.  I have reviewed the past medical  history, past surgical history, social history and family history with the patient and they are unchanged from previous note.  ALLERGIES:  is allergic to actos; celebrex; demerol; morphine and related; ciprofloxacin; metformin; and zocor.  MEDICATIONS:  Current Outpatient Prescriptions  Medication Sig Dispense Refill  . albuterol (PROVENTIL HFA) 108 (90 BASE) MCG/ACT inhaler Inhale 2 puffs into the lungs every 4 (four) hours as needed for shortness of breath. Wheezing     . albuterol (PROVENTIL) (2.5 MG/3ML) 0.083% nebulizer solution USE 1 VIAL WITH NEBULIZER EVERY 4 HOURS AS NEEDED FOR WHEEZING 75 mL 2  . aspirin 81 MG tablet Take 81 mg by mouth daily.    . dasatinib (SPRYCEL) 70 MG tablet Take 1 tablet (70 mg total) by mouth daily. 30 tablet 9  . fluticasone (FLONASE) 50 MCG/ACT nasal spray Place 2 sprays into both nostrils daily. Allergies 16 g 1  . glucose blood (ONE TOUCH TEST STRIPS) test strip 1 each by Other route 2 (two) times daily. Use to check blood sugars twice a day E11.9 100 each 3  . hydrochlorothiazide (MICROZIDE) 12.5 MG capsule Take 1 capsule (12.5 mg total) by mouth daily. 90 capsule 3  . isosorbide mononitrate (IMDUR) 30 MG 24 hr tablet Take 1 tablet (30 mg total) by mouth every evening. 30 tablet 11  . nitroGLYCERIN (NITROSTAT) 0.4 MG SL tablet Place 0.4 mg under the tongue every 5 (five) minutes as needed for chest pain. Chest pain     . Polyethyl Glycol-Propyl Glycol (SYSTANE) 0.4-0.3 % SOLN Apply 2 drops to eye 4 (four) times daily.     . pravastatin (PRAVACHOL) 20 MG tablet Take 3 tablets (60 mg total) by mouth daily. 270 tablet 3  . ranitidine (ZANTAC) 300 MG tablet Take 300 mg by mouth 2 (two) times daily.    [name]  valsartan (DIOVAN) 160 MG tablet Take 1 tablet (160 mg total) by mouth daily. 90 tablet 3   No current facility-administered medications for this visit.    PHYSICAL EXAMINATION: ECOG PERFORMANCE STATUS: 1 - Symptomatic but completely ambulatory  Filed  Vitals:   07/18/15 1029  BP: 137/54  Pulse: 59  Temp: 97.8 F (36.6 C)  Resp: 17   Filed Weights   07/18/15 1029  Weight: 222 lb 4.8 oz (100.835 kg)    GENERAL:alert, no distress and comfortable SKIN:  Noted multiple skin welts on both upper extremities , not consistent with regular drug rash. No other significant rashes elsewhere. Skin bruises unnoted EYES: normal, Conjunctiva are pink and non-injected, sclera clear OROPHARYNX:no exudate, no erythema and lips, buccal mucosa, and tongue normal  NECK: supple, thyroid normal size, non-tender, without nodularity LYMPH:  no palpable lymphadenopathy in the cervical, axillary or inguinal LUNGS: clear to auscultation and percussion with normal breathing effort HEART: regular rate & rhythm and no murmurs and no lower extremity edema ABDOMEN:abdomen soft, non-tender and normal bowel sounds. Colostomy site looks okay Musculoskeletal:no cyanosis of digits and no clubbing  NEURO: alert & oriented x 3 with fluent speech, no focal motor/sensory deficits  LABORATORY DATA:  I have reviewed the data as listed    Component Value Date/Time   NA 138 07/09/2015 1130   NA 127* 05/27/2015 1512   NA 135* 04/11/2014   K 4.2 07/09/2015 1130   K 4.8 05/27/2015 1512   CL 94* 05/27/2015 1512   CO2 23 07/09/2015 1130   CO2 27 05/27/2015 1512   GLUCOSE 123 07/09/2015 1130   GLUCOSE 107* 05/27/2015 1512   BUN 13.6 07/09/2015 1130   BUN 15 05/27/2015 1512   BUN 11 04/11/2014   CREATININE 1.1 07/09/2015 1130   CREATININE 0.83 05/27/2015 1512   CREATININE 1.0 04/11/2014   CALCIUM 9.8 07/09/2015 1130   CALCIUM 10.9* 05/27/2015 1512   PROT 6.9 07/09/2015 1130   PROT 7.3 05/27/2015 1512   ALBUMIN 3.9 07/09/2015 1130   ALBUMIN 4.1 05/27/2015 1512   AST 37* 07/09/2015 1130   AST 23 05/27/2015 1512   ALT 34 07/09/2015 1130   ALT 15 05/27/2015 1512   ALKPHOS 75 07/09/2015 1130   ALKPHOS 95 05/27/2015 1512   BILITOT 0.75 07/09/2015 1130   BILITOT 0.6  05/27/2015 1512   GFRNONAA 53* 12/10/2014 0530   GFRAA >60 12/10/2014 0530    No results found for: SPEP, UPEP  Lab Results  Component Value Date   WBC 8.9 07/09/2015   NEUTROABS 6.1 07/09/2015   HGB 11.8* 07/09/2015   HCT 36.1* 07/09/2015   MCV 94.4 07/09/2015   PLT 252 07/09/2015      Chemistry      Component Value Date/Time   NA 138 07/09/2015 1130   NA 127* 05/27/2015 1512   NA 135* 04/11/2014   K 4.2 07/09/2015 1130   K 4.8 05/27/2015 1512   CL 94* 05/27/2015 1512   CO2 23 07/09/2015 1130   CO2 27 05/27/2015 1512   BUN 13.6 07/09/2015 1130   BUN 15 05/27/2015 1512   BUN 11 04/11/2014   CREATININE 1.1 07/09/2015 1130   CREATININE 0.83 05/27/2015 1512   CREATININE 1.0 04/11/2014   GLU 158 04/11/2014      Component Value Date/Time   CALCIUM 9.8 07/09/2015 1130   CALCIUM 10.9* 05/27/2015 1512   ALKPHOS 75 07/09/2015 1130   ALKPHOS 95 05/27/2015 1512   AST 37* 07/09/2015 1130  AST 23 05/27/2015 1512   ALT 34 07/09/2015 1130   ALT 15 05/27/2015 1512   BILITOT 0.75 07/09/2015 1130   BILITOT 0.6 05/27/2015 1512      ASSESSMENT & PLAN:  CML (chronic myeloid leukemia) (HCC)  He has excellent response to treatment with normalization of his leukocytosis.  I reassured the patient at the skin rashes not related to Sprycel. We will continue same treatment.  Anemia in chronic illness This is likely anemia of chronic disease. The patient denies recent history of bleeding such as epistaxis, hematuria or hematochezia. He is asymptomatic from the anemia. We will observe for now.  He does not require transfusion now.    Welt  He has multiple skin welts which is not compatible with drug related skin rashes. Some of the welts on his upper arms resembles bug bites He has appointment to see dermatologist tomorrow and I will defer to them for further evaluation. In the meantime, I will like him to continue taking Sprycel and to take Benadryl as needed for itching   No  orders of the defined types were placed in this encounter.   All questions were answered. The patient knows to call the clinic with any problems, questions or concerns. No barriers to learning was detected. I spent 15 minutes counseling the patient face to face. The total time spent in the appointment was 20 minutes and more than 50% was on counseling and review of test results     [facility], [place], MD 07/18/2015 12:28 PM

## 2015-07-18 NOTE — Assessment & Plan Note (Signed)
He has multiple skin welts which is not compatible with drug related skin rashes. Some of the welts on his upper arms resembles bug bites He has appointment to see dermatologist tomorrow and I will defer to them for further evaluation. In the meantime, I will like him to continue taking Sprycel and to take Benadryl as needed for itching

## 2015-07-19 DIAGNOSIS — L02229 Furuncle of trunk, unspecified: Secondary | ICD-10-CM | POA: Diagnosis not present

## 2015-07-19 DIAGNOSIS — B9689 Other specified bacterial agents as the cause of diseases classified elsewhere: Secondary | ICD-10-CM | POA: Diagnosis not present

## 2015-07-19 DIAGNOSIS — L309 Dermatitis, unspecified: Secondary | ICD-10-CM | POA: Diagnosis not present

## 2015-07-23 ENCOUNTER — Other Ambulatory Visit: Payer: Medicare Other

## 2015-07-25 ENCOUNTER — Encounter: Payer: Self-pay | Admitting: Pharmacist

## 2015-07-25 NOTE — Progress Notes (Signed)
..  Oral Chemotherapy Follow-Up Form  Original Start date of oral chemotherapy: _1/19/2017_   Called patient today to follow up regarding patient's oral chemotherapy medication: _Sprycel_  Pt is doing well today. No issues regarding sprycel. No missed or extra doses. He is on free drug supply through [employer]. His blood pressure is slightly elevated this morning (155/80) per patient's home monitor. Pt states he has taken hs valsartan and hydrochlorothiazide as instructed by his cardiologist. He will call his primary care if needed or if blood pressure continues to be above goal range.   Pt reports __0__ tablets/doses missed in the last week/month.    Pt reports the following side effects: __none____  Other Issues: _mild BP elevation___   Will follow up and call patient again in _3 weeks__   Thank you,  Montel Clock, PharmD, [facility]

## 2015-07-29 ENCOUNTER — Encounter (HOSPITAL_COMMUNITY): Payer: Self-pay

## 2015-07-31 ENCOUNTER — Other Ambulatory Visit (INDEPENDENT_AMBULATORY_CARE_PROVIDER_SITE_OTHER): Payer: Medicare Other | Admitting: *Deleted

## 2015-07-31 DIAGNOSIS — I5032 Chronic diastolic (congestive) heart failure: Secondary | ICD-10-CM

## 2015-07-31 DIAGNOSIS — I495 Sick sinus syndrome: Secondary | ICD-10-CM | POA: Diagnosis not present

## 2015-07-31 DIAGNOSIS — Z950 Presence of cardiac pacemaker: Secondary | ICD-10-CM | POA: Diagnosis not present

## 2015-07-31 LAB — BASIC METABOLIC PANEL
BUN: 20 mg/dL (ref 7–25)
CALCIUM: 9.7 mg/dL (ref 8.6–10.3)
CO2: 21 mmol/L (ref 20–31)
CREATININE: 1.07 mg/dL (ref 0.70–1.11)
Chloride: 103 mmol/L (ref 98–110)
Glucose, Bld: 119 mg/dL — ABNORMAL HIGH (ref 65–99)
Potassium: 4.1 mmol/L (ref 3.5–5.3)
Sodium: 136 mmol/L (ref 135–146)

## 2015-08-06 ENCOUNTER — Other Ambulatory Visit (HOSPITAL_BASED_OUTPATIENT_CLINIC_OR_DEPARTMENT_OTHER): Payer: Medicare Other

## 2015-08-06 ENCOUNTER — Telehealth: Payer: Self-pay | Admitting: Hematology and Oncology

## 2015-08-06 ENCOUNTER — Ambulatory Visit (HOSPITAL_BASED_OUTPATIENT_CLINIC_OR_DEPARTMENT_OTHER): Payer: Medicare Other | Admitting: Hematology and Oncology

## 2015-08-06 VITALS — BP 143/55 | HR 79 | Temp 98.3°F | Resp 17 | Wt 221.8 lb

## 2015-08-06 DIAGNOSIS — D638 Anemia in other chronic diseases classified elsewhere: Secondary | ICD-10-CM

## 2015-08-06 DIAGNOSIS — C9210 Chronic myeloid leukemia, BCR/ABL-positive, not having achieved remission: Secondary | ICD-10-CM | POA: Diagnosis not present

## 2015-08-06 LAB — CBC WITH DIFFERENTIAL/PLATELET
BASO%: 0.5 % (ref 0.0–2.0)
Basophils Absolute: 0 10*3/uL (ref 0.0–0.1)
EOS%: 0.3 % (ref 0.0–7.0)
Eosinophils Absolute: 0 10*3/uL (ref 0.0–0.5)
HEMATOCRIT: 37.1 % — AB (ref 38.4–49.9)
HEMOGLOBIN: 12.1 g/dL — AB (ref 13.0–17.1)
LYMPH%: 8.9 % — ABNORMAL LOW (ref 14.0–49.0)
MCH: 31.5 pg (ref 27.2–33.4)
MCHC: 32.7 g/dL (ref 32.0–36.0)
MCV: 96.4 fL (ref 79.3–98.0)
MONO#: 0.7 10*3/uL (ref 0.1–0.9)
MONO%: 7.1 % (ref 0.0–14.0)
NEUT%: 83.2 % — ABNORMAL HIGH (ref 39.0–75.0)
NEUTROS ABS: 8.7 10*3/uL — AB (ref 1.5–6.5)
Platelets: 142 10*3/uL (ref 140–400)
RBC: 3.85 10*6/uL — ABNORMAL LOW (ref 4.20–5.82)
RDW: 15.8 % — AB (ref 11.0–14.6)
WBC: 10.5 10*3/uL — AB (ref 4.0–10.3)
lymph#: 0.9 10*3/uL (ref 0.9–3.3)

## 2015-08-06 LAB — COMPREHENSIVE METABOLIC PANEL
ALT: 22 U/L (ref 0–55)
AST: 29 U/L (ref 5–34)
Albumin: 4.1 g/dL (ref 3.5–5.0)
Alkaline Phosphatase: 70 U/L (ref 40–150)
Anion Gap: 8 mEq/L (ref 3–11)
BUN: 28.6 mg/dL — AB (ref 7.0–26.0)
CHLORIDE: 103 meq/L (ref 98–109)
CO2: 22 meq/L (ref 22–29)
CREATININE: 1.4 mg/dL — AB (ref 0.7–1.3)
Calcium: 10.4 mg/dL (ref 8.4–10.4)
EGFR: 45 mL/min/{1.73_m2} — ABNORMAL LOW (ref >90)
GLUCOSE: 105 mg/dL (ref 70–140)
POTASSIUM: 4.7 meq/L (ref 3.5–5.1)
Sodium: 133 mEq/L — ABNORMAL LOW (ref 136–145)
Total Bilirubin: 0.99 mg/dL (ref 0.20–1.20)
Total Protein: 7.2 g/dL (ref 6.4–8.3)

## 2015-08-06 NOTE — Telephone Encounter (Signed)
appt made and avs printed °

## 2015-08-06 NOTE — Assessment & Plan Note (Signed)
He has excellent response to treatment with normalization of his leukocytosis.  I reassured the patient that his recent skin rashes not related to Sprycel. We will continue same treatment. I will bring him back in 3 months with repeat blood work a week ahead of time to assess response to treatment

## 2015-08-06 NOTE — Progress Notes (Signed)
[facility] OFFICE PROGRESS NOTE  Patient Care Team: [name], MD as PCP - General (Internal Medicine) Belva Crome, MD as Consulting Physician (Cardiology) Suella Broad, MD as Consulting Physician (Physical Medicine and Rehabilitation) Deboraha Sprang, MD as Consulting Physician (Cardiology) Gatha Mayer, MD as Consulting Physician (Gastroenterology) Irine Seal, MD as Consulting Physician (Urology) Melida Quitter, MD as Consulting Physician (Otolaryngology) Susa Day, MD (Orthopedic Surgery) Deneise Lever, MD (Pulmonary Disease) Carol Ada, MD (Gastroenterology) Allyn Kenner, MD (Dermatology)  SUMMARY OF ONCOLOGIC HISTORY:   CML (chronic myeloid leukemia) ([place])   06/19/2015 Pathology Results Peripheral blood was positive for BCR/ABL at 45.49% a1a2 and on IS 37.76%   06/20/2015 Bone Marrow Biopsy Accession: [id number] BM biopsy confirmed CML. Cytogenetics showed 5;78 translocation and deletion Y   06/26/2015 -  Chemotherapy He is started on Sprycel for CML    INTERVAL HISTORY: Please see below for problem oriented charting. He feels well. Recent skin rash have resolved. He denies shortness of breath or diarrhea. No recent infection  REVIEW OF SYSTEMS:   Constitutional: Denies fevers, chills or abnormal weight loss Eyes: Denies blurriness of vision Ears, nose, mouth, throat, and face: Denies mucositis or sore throat Respiratory: Denies cough, dyspnea or wheezes Cardiovascular: Denies palpitation, chest discomfort or lower extremity swelling Gastrointestinal:  Denies nausea, heartburn or change in bowel habits Skin: Denies abnormal skin rashes Lymphatics: Denies new lymphadenopathy or easy bruising Neurological:Denies numbness, tingling or new weaknesses Behavioral/Psych: Mood is stable, no new changes  All other systems were reviewed with the patient and are negative.  I have reviewed the past medical history, past surgical history, social history  and family history with the patient and they are unchanged from previous note.  ALLERGIES:  is allergic to actos; celebrex; demerol; morphine and related; ciprofloxacin; metformin; and zocor.  MEDICATIONS:  Current Outpatient Prescriptions  Medication Sig Dispense Refill  . albuterol (PROVENTIL HFA) 108 (90 BASE) MCG/ACT inhaler Inhale 2 puffs into the lungs every 4 (four) hours as needed for shortness of breath. Wheezing     . albuterol (PROVENTIL) (2.5 MG/3ML) 0.083% nebulizer solution USE 1 VIAL WITH NEBULIZER EVERY 4 HOURS AS NEEDED FOR WHEEZING 75 mL 2  . aspirin 81 MG tablet Take 81 mg by mouth daily.    . dasatinib (SPRYCEL) 70 MG tablet Take 1 tablet (70 mg total) by mouth daily. 30 tablet 9  . fluticasone (FLONASE) 50 MCG/ACT nasal spray Place 2 sprays into both nostrils daily. Allergies 16 g 1  . glucose blood (ONE TOUCH TEST STRIPS) test strip 1 each by Other route 2 (two) times daily. Use to check blood sugars twice a day E11.9 100 each 3  . hydrochlorothiazide (MICROZIDE) 12.5 MG capsule Take 1 capsule (12.5 mg total) by mouth daily. 90 capsule 3  . isosorbide mononitrate (IMDUR) 30 MG 24 hr tablet Take 1 tablet (30 mg total) by mouth every evening. 30 tablet 11  . nitroGLYCERIN (NITROSTAT) 0.4 MG SL tablet Place 0.4 mg under the tongue every 5 (five) minutes as needed for chest pain. Chest pain     . Polyethyl Glycol-Propyl Glycol (SYSTANE) 0.4-0.3 % SOLN Apply 2 drops to eye 4 (four) times daily.     . pravastatin (PRAVACHOL) 20 MG tablet Take 3 tablets (60 mg total) by mouth daily. 270 tablet 3  . ranitidine (ZANTAC) 300 MG tablet Take 300 mg by mouth 2 (two) times daily.    . valsartan (DIOVAN) 160 MG tablet Take 1  tablet (160 mg total) by mouth daily. 90 tablet 3   No current facility-administered medications for this visit.    PHYSICAL EXAMINATION: ECOG PERFORMANCE STATUS: 1 - Symptomatic but completely ambulatory  Filed Vitals:   08/06/15 1247  BP: 143/55  Pulse:  79  Temp: 98.3 F (36.8 C)  Resp: 17   Filed Weights   08/06/15 1247  Weight: 221 lb 12.8 oz (100.608 kg)    GENERAL:alert, no distress and comfortable SKIN: skin color, texture, turgor are normal, no rashes or significant lesions EYES: normal, Conjunctiva are pink and non-injected, sclera clear OROPHARYNX:no exudate, no erythema and lips, buccal mucosa, and tongue normal  NECK: supple, thyroid normal size, non-tender, without nodularity LYMPH:  no palpable lymphadenopathy in the cervical, axillary or inguinal LUNGS: clear to auscultation and percussion with normal breathing effort HEART: regular rate & rhythm and no murmurs and no lower extremity edema ABDOMEN:abdomen soft, non-tender and normal bowel sounds Musculoskeletal:no cyanosis of digits and no clubbing  NEURO: alert & oriented x 3 with fluent speech, no focal motor/sensory deficits  LABORATORY DATA:  I have reviewed the data as listed    Component Value Date/Time   NA 136 07/31/2015 1024   NA 138 07/09/2015 1130   NA 135* 04/11/2014   K 4.1 07/31/2015 1024   K 4.2 07/09/2015 1130   CL 103 07/31/2015 1024   CO2 21 07/31/2015 1024   CO2 23 07/09/2015 1130   GLUCOSE 119* 07/31/2015 1024   GLUCOSE 123 07/09/2015 1130   BUN 20 07/31/2015 1024   BUN 13.6 07/09/2015 1130   BUN 11 04/11/2014   CREATININE 1.07 07/31/2015 1024   CREATININE 1.1 07/09/2015 1130   CREATININE 0.83 05/27/2015 1512   CREATININE 1.0 04/11/2014   CALCIUM 9.7 07/31/2015 1024   CALCIUM 9.8 07/09/2015 1130   PROT 6.9 07/09/2015 1130   PROT 7.3 05/27/2015 1512   ALBUMIN 3.9 07/09/2015 1130   ALBUMIN 4.1 05/27/2015 1512   AST 37* 07/09/2015 1130   AST 23 05/27/2015 1512   ALT 34 07/09/2015 1130   ALT 15 05/27/2015 1512   ALKPHOS 75 07/09/2015 1130   ALKPHOS 95 05/27/2015 1512   BILITOT 0.75 07/09/2015 1130   BILITOT 0.6 05/27/2015 1512   GFRNONAA 53* 12/10/2014 0530   GFRAA >60 12/10/2014 0530    No results found for: SPEP, UPEP  Lab  Results  Component Value Date   WBC 10.5* 08/06/2015   NEUTROABS 8.7* 08/06/2015   HGB 12.1* 08/06/2015   HCT 37.1* 08/06/2015   MCV 96.4 08/06/2015   PLT 142 08/06/2015      Chemistry      Component Value Date/Time   NA 136 07/31/2015 1024   NA 138 07/09/2015 1130   NA 135* 04/11/2014   K 4.1 07/31/2015 1024   K 4.2 07/09/2015 1130   CL 103 07/31/2015 1024   CO2 21 07/31/2015 1024   CO2 23 07/09/2015 1130   BUN 20 07/31/2015 1024   BUN 13.6 07/09/2015 1130   BUN 11 04/11/2014   CREATININE 1.07 07/31/2015 1024   CREATININE 1.1 07/09/2015 1130   CREATININE 0.83 05/27/2015 1512   CREATININE 1.0 04/11/2014   GLU 158 04/11/2014      Component Value Date/Time   CALCIUM 9.7 07/31/2015 1024   CALCIUM 9.8 07/09/2015 1130   ALKPHOS 75 07/09/2015 1130   ALKPHOS 95 05/27/2015 1512   AST 37* 07/09/2015 1130   AST 23 05/27/2015 1512   ALT 34 07/09/2015 1130  ALT 15 05/27/2015 1512   BILITOT 0.75 07/09/2015 1130   BILITOT 0.6 05/27/2015 1512     ASSESSMENT & PLAN:  CML (chronic myeloid leukemia) ([place])  He has excellent response to treatment with normalization of his leukocytosis.  I reassured the patient that his recent skin rashes not related to Sprycel. We will continue same treatment. I will bring him back in 3 months with repeat blood work a week ahead of time to assess response to treatment  Anemia in chronic illness This is likely anemia of chronic disease. The patient denies recent history of bleeding such as epistaxis, hematuria or hematochezia. He is asymptomatic from the anemia. We will observe for now.  He does not require transfusion now.       Orders Placed This Encounter  Procedures  . BCR-ABL    With RT-PCR technique    Standing Status: Future     Number of Occurrences:      Standing Expiration Date: 09/09/2016   All questions were answered. The patient knows to call the clinic with any problems, questions or concerns. No barriers to learning was  detected. I spent 15 minutes counseling the patient face to face. The total time spent in the appointment was 20 minutes and more than 50% was on counseling and review of test results     [facility], [place], MD 08/06/2015 1:01 PM

## 2015-08-06 NOTE — Progress Notes (Signed)
Cardiology Office Note:    Date:  08/06/2015   ID:  Eric [name], DOB May 14, 1927, MRN [id number]  PCP:  [name], MD  Cardiologist:  Dr. Daneen Schick   Electrophysiologist:  Dr. [name]   Chief Complaint  Patient presents with  . Congestive Heart Failure    Follow up    History of Present Illness:     Eric [name] is a 82 y.o. male with a hx of CAD s/p CABG and subsequent stenting to S-Dx and S-OM1/OM2, SSS s/p PPM, HTN, HL, GERD, COPD, DM2, prostate CA. Patient admitted with chest pain in 2/15 and myoview was low risk without ischemia. Last seen by Dr. Daneen Schick in 08/2013. He was having trouble with gallstones at that time. Patient called in recently with chest pain and labile BP. He returns for evaluation.   He was seen in primary care over the last couple of weeks with lightheadedness. His Diovan was stopped as well as his Bumex. The patient continued to note dizziness and near syncope. He was having to cut Imdur 120 in 1/2 to get 60 mg. He stopped taking this and started to feel much better. His BP is somewhat labile. He brings in a list of readings today that have been optimal since stopping the Imdur. He does describe some chest pain. He has right-sided discomfort. He has a burning sensation. He also notes discomfort with eating. He tells me that his breathing is worse over time. He is probably NYHA class IIb. He denies orthopnea, PND or edema. He denies syncope. He denies any radiating symptoms, nausea or diaphoresis associated with his chest pain.    Past Medical History  Diagnosis Date  . LACTOSE INTOLERANCE   . OBESITY   . CARDIOMYOPATHY, ISCHEMIC   . AORTIC SCLEROSIS   . SICK SINUS/ TACHY-BRADY SYNDROME 09/2007    s/p PPM st judes  . PERIPHERAL VASCULAR DISEASE   . CAROTID BRUIT, RIGHT 02/27/2008  . IBS (irritable bowel syndrome)   . ALLERGIC RHINITIS   . ANEMIA-NOS   . OA (osteoarthritis)   . COPD   . GERD   . HYPERLIPIDEMIA     . HIATAL HERNIA   . Diverticulosis   . Prostate cancer ([place])     seed implants 2004  . DIABETES MELLITUS-TYPE II     diet controlled  . CORONARY ARTERY DISEASE     CABG 1995, PTCA/DES 2008, 2009 and 08/2010  . HYPERTENSION   . Asthma   . Sleep apnea   . Partial small bowel obstruction ([place])   . Primary hyperparathyroidism ([place])     Lab Results Component Value Date  PTH 150.7* 02/13/2013  CALCIUM 11.0* 02/13/2013  CAION 1.21 03/15/2008    . SMALL BOWEL OBSTRUCTION 04/18/2009    Qualifier: History of  By: Asa Lente MD, Jannifer Rodney Cataract     surgery  . Symptomatic diverticulosis 01/18/2009    Qualifier: Diagnosis of  By: Trellis Paganini PA-c, Amy S   . Hx of echocardiogram     Echo (9/15):  Mild LVH, EF 50-55%, no RWMA, Gr 1 DD, MAC, mild LAE.  . Diastolic dysfunction, Grade 1 11/24/2014  . Diverticulitis of colon with perforation 11/22/2014  . Hyponatremia 11/22/2014  . CML (chronic myeloid leukemia) ([place]) 06/26/2015    Past Surgical History  Procedure Laterality Date  . Ptca  2008, 2009, 2012    with DES  . Partial small bowel obstruction  2009  . Bilateral cataracts    .  Pacemaker insertion      DDD/[employer]         Last interrogation 2/13  on chart     Pacemaker guideline order Dr Tamala Julian on chart  . Inguinal hernia repair Bilateral   . Lumbar disc surgery  12/2008  . Coronary artery bypass graft    . Total hip arthroplasty  08/21/2011    Procedure: TOTAL HIP ARTHROPLASTY;  Surgeon: Johnn Hai, MD;  Location: [facility];  Service: Orthopedics;  Laterality: Right;  . Colonoscopy    . Esophagogastroduodenoscopy  multiple  . Penile prosthesis placement    . Back surgery    . Colon surgery    . Flexible sigmoidoscopy N/A 09/22/2013    Procedure: FLEXIBLE SIGMOIDOSCOPY;  Surgeon: Gatha Mayer, MD;  Location: [facility] END[facility]COPY;  Service: Endoscopy;  Laterality: N/A;  . Colon resection N/A 11/28/2014    Procedure: EXPLORATORY LAPAROTOMY, SIGMOID COLECTOMY WITH COLOSTOMY;  Surgeon: Jackolyn Confer, MD;  Location: [facility];  Service: General;  Laterality: N/A;    Current Medications: Outpatient Prescriptions Prior to Visit  Medication Sig Dispense Refill  . albuterol (PROVENTIL HFA) 108 (90 BASE) MCG/ACT inhaler Inhale 2 puffs into the lungs every 4 (four) hours as needed for shortness of breath. Wheezing     . albuterol (PROVENTIL) (2.5 MG/3ML) 0.083% nebulizer solution USE 1 VIAL WITH NEBULIZER EVERY 4 HOURS AS NEEDED FOR WHEEZING 75 mL 2  . aspirin 81 MG tablet Take 81 mg by mouth daily.    . dasatinib (SPRYCEL) 70 MG tablet Take 1 tablet (70 mg total) by mouth daily. 30 tablet 9  . fluticasone (FLONASE) 50 MCG/ACT nasal spray Place 2 sprays into both nostrils daily. Allergies 16 g 1  . glucose blood (ONE TOUCH TEST STRIPS) test strip 1 each by Other route 2 (two) times daily. Use to check blood sugars twice a day E11.9 100 each 3  . hydrochlorothiazide (MICROZIDE) 12.5 MG capsule Take 1 capsule (12.5 mg total) by mouth daily. 90 capsule 3  . isosorbide mononitrate (IMDUR) 30 MG 24 hr tablet Take 1 tablet (30 mg total) by mouth every evening. 30 tablet 11  . nitroGLYCERIN (NITROSTAT) 0.4 MG SL tablet Place 0.4 mg under the tongue every 5 (five) minutes as needed for chest pain. Chest pain     . Polyethyl Glycol-Propyl Glycol (SYSTANE) 0.4-0.3 % SOLN Apply 2 drops to eye 4 (four) times daily.     . pravastatin (PRAVACHOL) 20 MG tablet Take 3 tablets (60 mg total) by mouth daily. 270 tablet 3  . ranitidine (ZANTAC) 300 MG tablet Take 300 mg by mouth 2 (two) times daily.    . valsartan (DIOVAN) 160 MG tablet Take 1 tablet (160 mg total) by mouth daily. 90 tablet 3   No facility-administered medications prior to visit.     Allergies:   Actos; Celebrex; Demerol; Morphine and related; Ciprofloxacin; Metformin; and Zocor   Social History   Social History  . Marital Status: Widowed    Spouse Name: N/A  . Number of Children: N/A  . Years of Education: N/A   Occupational  History  . retired    Social History Main Topics  . Smoking status: Former Smoker -- 1.00 packs/day for 25 years    Types: Cigarettes    Quit date: 06/08/1994  . Smokeless tobacco: Never Used  . Alcohol Use: No  . Drug Use: No  . Sexual Activity: Yes     Comment: daughter is the next kin,  4 children, non-smoker, retired Control and instrumentation engineer   Other Topics Concern  . Not on file   Social History Narrative     Family History:  The patient's family history includes Cancer in his mother; Heart attack in his brother; Hypertension in his mother. There is no history of Colon cancer or Stroke.   ROS:   Please see the history of present illness.    ROS All other systems reviewed and are negative.   Physical Exam:    VS:  There were no vitals taken for this visit.   GEN: Well nourished, well developed, in no acute distress HEENT: normal Neck: no JVD, no masses Cardiac: Normal S1/S2, RRR; no murmurs, rubs, or gallops, no edema;   carotid bruits,   Respiratory:  clear to auscultation bilaterally; no wheezing, rhonchi or rales GI: soft, nontender, nondistended, + BS MS: no deformity or atrophy Skin: warm and dry, no rash Neuro:  Bilateral strength equal, no focal deficits  Psych: Alert and oriented x 3, normal affect  Wt Readings from Last 3 Encounters:  08/06/15 221 lb 12.8 oz (100.608 kg)  07/18/15 222 lb 4.8 oz (100.835 kg)  07/16/15 230 lb 12.8 oz (104.69 kg)      Studies/Labs Reviewed:     EKG:  EKG is  ordered today.  The ekg ordered today demonstrates   Recent Labs: 11/22/2014: Magnesium 1.9; TSH 0.672 12/10/2014: B Natriuretic Peptide 32.7 05/27/2015: Pro B Natriuretic peptide (BNP) 80.0 08/06/2015: ALT 22; BUN 28.6*; Creatinine 1.4*; HGB 12.1*; Platelets 142; Potassium 4.7; Sodium 133*   Recent Lipid Panel    Component Value Date/Time   CHOL 108 05/27/2015 1512   TRIG 151.0* 05/27/2015 1512   TRIG 70 01/06/2010   HDL 22.70* 05/27/2015 1512   CHOLHDL 5 05/27/2015 1512    VLDL 30.2 05/27/2015 1512   LDLCALC 55 05/27/2015 1512    Additional studies/ records that were reviewed today include:    Carotid US 11/16 Heterogeneous plaque, bilaterally. Stable 1-39% RICA stenosis. Stable 123456 LICA stenosis. Normal subclavian arteries, bilaterally. Patent vertebral arteries with antegrade flow. f/u 2 years, stable over serial exams.  Echo 9/15 - Left ventricle: The cavity size was normal. Wall thickness was increased in a pattern of mild LVH. There was mild concentric hypertrophy. Systolic function was normal. The estimated ejection fraction was in the range of 50% to 55%. Wall motion was normal; there were no regional wall motion abnormalities. Doppler parameters are consistent with abnormal left ventricular relaxation (grade 1 diastolic dysfunction). - Aortic valve: Cusp separation was reduced. - Mitral valve: Calcified annulus. - Left atrium: The atrium was mildly dilated.  - LHC (3/12): RCA, RI, OM occluded, LAD 80% ostial, LM diff 40-60%, S-Dx stent patent, S-RCA patent, L-LAD patent, S-CFX with diffuse disease >>> PCI: Promus DES to mid S-OM1/OM2 - Nuclear (2/15): Inf-lat infarct, no ischemia, EF 64% - Low Risk - Carotid US (11/14): L < 60%, R < 50% - repeat 1 year   ASSESSMENT:     No diagnosis found.  PLAN:     In order of problems listed above:  1. Chest pain: His symptoms are very atypical. He had a recent nuclear scan in February that was low risk. He does have increased dyspnea with exertion. He is currently off of his nitrates. I reviewed his case with Dr. Tamala Julian. We will continue with medical management for now. I will resume his isosorbide a lower dose - 30 mg daily. I will check a BNP and an echocardiogram.  If his BNP is increased, I will place him back on a lower dose of diuretic. If the patient continues to have symptoms or they progress, we may need to consider cardiac catheterization. 2. Shortness of breath - Plan:  B Nat Peptide, 2D Echocardiogram without contrast. 3. Atherosclerosis of native coronary artery of native heart without angina pectoris: Resume isosorbide at a lower dose as noted above. Continue aspirin, Plavix, statin. 4. Unspecified essential hypertension: This is somewhat labile. His blood pressure seems to run higher in the office than at home. Medication adjustments will be made as noted above. 5. Other and unspecified hyperlipidemia: Continue statin. 6. Tachy-brady syndrome s/pCardiac pacemaker in situ: Followup with EP as planned. 7. Carotid stenosis, unspecified laterality: Follow up carotid US planned in 04/2014. 8. GERD: He has follow up with gastroenterology soon. Continue current dose of PPI.   Medication Adjustments/Labs and Tests Ordered: Current medicines are reviewed at length with the patient today.  Concerns regarding medicines are outlined above.  Medication changes, Labs and Tests ordered today are outlined in the Patient Instructions noted below. There are no Patient Instructions on file for this visit. Signed, Richardson Dopp, PA-C  08/06/2015 9:22 PM    [facility] [address], [place], [place]  [address] Phone: [phone]; Fax: [phone]     This encounter was created in error - please disregard.

## 2015-08-06 NOTE — Assessment & Plan Note (Signed)
This is likely anemia of chronic disease. The patient denies recent history of bleeding such as epistaxis, hematuria or hematochezia. He is asymptomatic from the anemia. We will observe for now.  He does not require transfusion now.   

## 2015-08-07 ENCOUNTER — Encounter: Payer: Medicare Other | Admitting: Physician Assistant

## 2015-08-15 ENCOUNTER — Encounter: Payer: Self-pay | Admitting: Pharmacist

## 2015-08-15 NOTE — Progress Notes (Signed)
Oral Chemotherapy Follow-Up Form  Original Start date of oral chemotherapy: _1/19/2017_   Called patient today to follow up regarding patient's oral chemotherapy medication: _Sprycel_  Pt is doing well today. No issues regarding sprycel. No missed or extra doses. He is on free drug supply through [employer]. His next month supply was delivered to the cancer center on 08/14/15. He will come and pick this up from my office on Friday 08/16/15. Mr. Eric [name] states he is feeling great.    Pt reports __0__ tablets/doses missed in the last week/month.    Pt reports the following side effects: __none____    Will follow up and call patient again in _4 weeks__   Thank you,  Montel Clock, PharmD, [facility]

## 2015-08-21 DIAGNOSIS — R300 Dysuria: Secondary | ICD-10-CM | POA: Diagnosis not present

## 2015-08-21 DIAGNOSIS — N4889 Other specified disorders of penis: Secondary | ICD-10-CM | POA: Diagnosis not present

## 2015-08-21 DIAGNOSIS — Z0000 Encounter for general adult medical examination without abnormal findings: Secondary | ICD-10-CM | POA: Diagnosis not present

## 2015-08-26 ENCOUNTER — Telehealth: Payer: Self-pay | Admitting: *Deleted

## 2015-08-26 ENCOUNTER — Encounter: Payer: Self-pay | Admitting: Internal Medicine

## 2015-08-26 ENCOUNTER — Ambulatory Visit (INDEPENDENT_AMBULATORY_CARE_PROVIDER_SITE_OTHER): Payer: Medicare Other | Admitting: Internal Medicine

## 2015-08-26 VITALS — BP 138/62 | HR 76 | Ht 66.0 in | Wt 225.1 lb

## 2015-08-26 DIAGNOSIS — I495 Sick sinus syndrome: Secondary | ICD-10-CM | POA: Diagnosis not present

## 2015-08-26 DIAGNOSIS — I6523 Occlusion and stenosis of bilateral carotid arteries: Secondary | ICD-10-CM

## 2015-08-26 DIAGNOSIS — Z950 Presence of cardiac pacemaker: Secondary | ICD-10-CM

## 2015-08-26 MED ORDER — GLUCOSE BLOOD VI STRP: ORAL_STRIP | Status: DC

## 2015-08-26 NOTE — Patient Instructions (Signed)
Medication Instructions: - Your physician recommends that you continue on your current medications as directed. Please refer to the Current Medication list given to you today.  Labwork: - none  Procedures/Testing: - none  Follow-Up: - Remote monitoring is used to monitor your Pacemaker of ICD from home. This monitoring reduces the number of office visits required to check your device to one time per year. It allows [place] to keep an eye on the functioning of your device to ensure it is working properly. You are scheduled for a device check from home on 11/25/15. You may send your transmission at any time that day. If you have a wireless device, the transmission will be sent automatically. After your physician reviews your transmission, you will receive a postcard with your next transmission date.  - Your physician wants you to follow-up in: 1 year with Dr. Caryl Comes. You will receive a reminder letter in the mail two months in advance. If you don't receive a letter, please call our office to schedule the follow-up appointment.  Any Additional Special Instructions Will Be Listed Below (If Applicable).     If you need a refill on your cardiac medications before your next appointment, please call your pharmacy.

## 2015-08-26 NOTE — Telephone Encounter (Signed)
Left msg on triage pt is needing refills on his testing strips. The CME form that we have states ptis checking BS once a day if checking more than will need to update CME form. Sent refill for strips...Johny Chess

## 2015-08-26 NOTE — Progress Notes (Signed)
Electrophysiology Office Note   Date:  08/26/2015   ID:  Eric [name], DOB 1926-07-19, MRN [id number]  PCP:  [name], MD  Cardiologist: [facility] Primary Electrophysiologist: [name]   Eric Axe, MD    No chief complaint on file.    History of Present Illness: Eric [name] is a 82 y.o. male is seen today in followup for   Pacemaker implanted 2009 for sinus node dysfunction.  Hx of CAD with CABG Cath 2009 at which time vein graft to diagonal was stented. In 2012 he underwent stenting of his vein graft to the OM. LVEF 55% echo 9/15    Today, he denies symptoms of palpitations, chest pain, shortness of breath, orthopnea, PND, lower extremity edema, claudication, dizziness, presyncope, syncope, bleeding, or neurologic sequela.   The patient is tolerating medications without difficulties and is otherwise without complaint today.    He tells a story of a very problematic encounter with diverticulitis complicated by pneumonia. He is gradually getting better  Past Medical History  Diagnosis Date  . LACTOSE INTOLERANCE   . OBESITY   . CARDIOMYOPATHY, ISCHEMIC   . AORTIC SCLEROSIS   . SICK SINUS/ TACHY-BRADY SYNDROME 09/2007    s/p PPM st judes  . PERIPHERAL VASCULAR DISEASE   . CAROTID BRUIT, RIGHT 02/27/2008  . IBS (irritable bowel syndrome)   . ALLERGIC RHINITIS   . ANEMIA-NOS   . OA (osteoarthritis)   . COPD   . GERD   . HYPERLIPIDEMIA   . HIATAL HERNIA   . Diverticulosis   . Prostate cancer ([place])     seed implants 2004  . DIABETES MELLITUS-TYPE II     diet controlled  . CORONARY ARTERY DISEASE     CABG 1995, PTCA/DES 2008, 2009 and 08/2010  . HYPERTENSION   . Asthma   . Sleep apnea   . Partial small bowel obstruction ([place])   . Primary hyperparathyroidism ([place])     Lab Results Component Value Date  PTH 150.7* 02/13/2013  CALCIUM 11.0* 02/13/2013  CAION 1.21 03/15/2008    . SMALL BOWEL OBSTRUCTION 04/18/2009    Qualifier: History of  By: Asa Lente MD,  Jannifer Rodney Cataract     surgery  . Symptomatic diverticulosis 01/18/2009    Qualifier: Diagnosis of  By: Trellis Paganini PA-c, Amy S   . Hx of echocardiogram     Echo (9/15):  Mild LVH, EF 50-55%, no RWMA, Gr 1 DD, MAC, mild LAE.  . Diastolic dysfunction, Grade 1 11/24/2014  . Diverticulitis of colon with perforation 11/22/2014  . Hyponatremia 11/22/2014  . CML (chronic myeloid leukemia) ([place]) 06/26/2015   Past Surgical History  Procedure Laterality Date  . Ptca  2008, 2009, 2012    with DES  . Partial small bowel obstruction  2009  . Bilateral cataracts    . Pacemaker insertion      DDD/[employer]         Last interrogation 2/13  on chart     Pacemaker guideline order Dr Tamala Julian on chart  . Inguinal hernia repair Bilateral   . Lumbar disc surgery  12/2008  . Coronary artery bypass graft    . Total hip arthroplasty  08/21/2011    Procedure: TOTAL HIP ARTHROPLASTY;  Surgeon: Johnn Hai, MD;  Location: [facility];  Service: Orthopedics;  Laterality: Right;  . Colonoscopy    . Esophagogastroduodenoscopy  multiple  . Penile prosthesis placement    . Back surgery    .  Colon surgery    . Flexible sigmoidoscopy N/A 09/22/2013    Procedure: FLEXIBLE SIGMOIDOSCOPY;  Surgeon: Gatha Mayer, MD;  Location: [facility];  Service: Endoscopy;  Laterality: N/A;  . Colon resection N/A 11/28/2014    Procedure: EXPLORATORY LAPAROTOMY, SIGMOID COLECTOMY WITH COLOSTOMY;  Surgeon: Jackolyn Confer, MD;  Location: [facility];  Service: General;  Laterality: N/A;     Current Outpatient Prescriptions  Medication Sig Dispense Refill  . albuterol (PROVENTIL HFA) 108 (90 BASE) MCG/ACT inhaler Inhale 2 puffs into the lungs every 4 (four) hours as needed for shortness of breath. Wheezing     . albuterol (PROVENTIL) (2.5 MG/3ML) 0.083% nebulizer solution USE 1 VIAL WITH NEBULIZER EVERY 4 HOURS AS NEEDED FOR WHEEZING 75 mL 2  . aspirin 81 MG tablet Take 81 mg by mouth daily.    . dasatinib (SPRYCEL) 70 MG tablet  Take 1 tablet (70 mg total) by mouth daily. 30 tablet 9  . fluticasone (FLONASE) 50 MCG/ACT nasal spray Place 2 sprays into both nostrils daily. Allergies 16 g 1  . glucose blood (ONE TOUCH TEST STRIPS) test strip Use to check blood sugars once a day Dx E11.9 100 each 3  . hydrochlorothiazide (MICROZIDE) 12.5 MG capsule Take 1 capsule (12.5 mg total) by mouth daily. 90 capsule 3  . isosorbide mononitrate (IMDUR) 30 MG 24 hr tablet Take 1 tablet (30 mg total) by mouth every evening. 30 tablet 11  . nitroGLYCERIN (NITROSTAT) 0.4 MG SL tablet Place 0.4 mg under the tongue every 5 (five) minutes as needed for chest pain. Chest pain     . Polyethyl Glycol-Propyl Glycol (SYSTANE) 0.4-0.3 % SOLN Apply 2 drops to eye 4 (four) times daily.     . pravastatin (PRAVACHOL) 20 MG tablet Take 3 tablets (60 mg total) by mouth daily. 270 tablet 3  . ranitidine (ZANTAC) 300 MG tablet Take 300 mg by mouth 2 (two) times daily.    . valsartan (DIOVAN) 160 MG tablet Take 1 tablet (160 mg total) by mouth daily. 90 tablet 3   No current facility-administered medications for this visit.    Allergies:   Actos; Celebrex; Demerol; Morphine and related; Ciprofloxacin; Metformin; and Zocor   Social History:  The patient  reports that he quit smoking about 21 years ago. His smoking use included Cigarettes. He has a 25 pack-year smoking history. He has never used smokeless tobacco. He reports that he does not drink alcohol or use illicit drugs.   Family History:  The patient's    family history includes Cancer in his mother; Heart attack in his brother; Hypertension in his mother. There is no history of Colon cancer or Stroke.    ROS:  Please see the history of present illness and past medical history  Otherwise, review of systems is negative      PHYSICAL EXAM: VS:  BP 138/62 mmHg  Pulse 76  Ht 5\' 6"  (1.676 m)  Wt 225 lb 1.9 oz (102.114 kg)  BMI 36.35 kg/m2 , BMI Body mass index is 36.35 kg/(m^2). GEN: Well  nourished, well developed, in no acute distress HEENT: normal Neck:  JVD flat , carotid bruits, or masses Cardiac: REGULAR RATE and RHYTHM ; 2/6 No murmurs, rubs, No S4  Back without kyphosis or CVAT Respiratory:  clear to auscultation bilaterally, normal work of breathing GI: soft, nontender, nondistended, + BS MS: no deformity or atrophy Extremities no clubbing cyanosis  edema Skin: warm and dry,  device pocket is well healed without  teathering Neuro:  Strength and sensation are intact Psych: euthymic mood, full affect  EKG:  EKG is not ordered today. T  Device interrogation is reviewed today in detail.  See PaceArt for details.  Recent Labs: 11/22/2014: Magnesium 1.9; TSH 0.672 12/10/2014: B Natriuretic Peptide 32.7 05/27/2015: Pro B Natriuretic peptide (BNP) 80.0 08/06/2015: ALT 22; BUN 28.6*; Creatinine 1.4*; HGB 12.1*; Platelets 142; Potassium 4.7; Sodium 133*    Lipid Panel     Component Value Date/Time   CHOL 108 05/27/2015 1512   TRIG 151.0* 05/27/2015 1512   TRIG 70 01/06/2010   HDL 22.70* 05/27/2015 1512   CHOLHDL 5 05/27/2015 1512   VLDL 30.2 05/27/2015 1512   LDLCALC 55 05/27/2015 1512     Wt Readings from Last 3 Encounters:  08/26/15 225 lb 1.9 oz (102.114 kg)  08/06/15 221 lb 12.8 oz (100.608 kg)  07/18/15 222 lb 4.8 oz (100.835 kg)      Other studies Reviewed: Additional studies/ records that were reviewed today include: none   Review of the above records today demonstrates: As above    ASSESSMENT AND PLAN: Sinus Node Dysfunction--   Pacemaker St Jude The patient's device was interrogated.  The information was reviewed. No changes were made in the programming.     CAD  Hypertension  Weigh loss  Dyslipidemia  CAD without symptoms of ischemia  HTN better controlled   One intercurrent atrial fibrillation Episode 8/16 duration 20 minutes probably not sufficient to justify anticoagulation we do not have electrograms   Also based on the  recent data, I will ask Dr [facility] to consider stopping his niacin    Current medicines are reviewed at length with the patient today.   The patient does not have concerns regarding his medicines.  The following changes were made today:  As above   Labs/ tests ordered today include:    No orders of the defined types were placed in this encounter.     Disposition:   FU with me   1 year(s)  Signed, Eric Axe, MD  08/26/2015 4:33 PM     [address] [place] [address] [phone] (office) [phone] (fax)

## 2015-08-27 LAB — CUP PACEART INCLINIC DEVICE CHECK
Date Time Interrogation Session: 20170320201827
Implantable Lead Location: 753859
Lead Channel Pacing Threshold Amplitude: 1 V
Lead Channel Pacing Threshold Pulse Width: 0.4 ms
Lead Channel Setting Pacing Pulse Width: 0.4 ms
Lead Channel Setting Sensing Sensitivity: 2 mV
MDC IDC LEAD IMPLANT DT: 20090414
MDC IDC LEAD IMPLANT DT: 20090414
MDC IDC LEAD LOCATION: 753860
MDC IDC MSMT BATTERY IMPEDANCE: 1400 Ohm
MDC IDC MSMT BATTERY VOLTAGE: 2.76 V
MDC IDC MSMT LEADCHNL RA IMPEDANCE VALUE: 360 Ohm
MDC IDC MSMT LEADCHNL RA PACING THRESHOLD AMPLITUDE: 0.75 V
MDC IDC MSMT LEADCHNL RA PACING THRESHOLD PULSEWIDTH: 0.4 ms
MDC IDC MSMT LEADCHNL RA SENSING INTR AMPL: 3.8 mV
MDC IDC MSMT LEADCHNL RV IMPEDANCE VALUE: 515 Ohm
MDC IDC MSMT LEADCHNL RV SENSING INTR AMPL: 8.4 mV
MDC IDC SET LEADCHNL RA PACING AMPLITUDE: 2 V
Pulse Gen Model: 5826
Pulse Gen Serial Number: 2094876

## 2015-09-10 ENCOUNTER — Encounter: Payer: Self-pay | Admitting: Internal Medicine

## 2015-09-10 ENCOUNTER — Ambulatory Visit (INDEPENDENT_AMBULATORY_CARE_PROVIDER_SITE_OTHER): Payer: Medicare Other | Admitting: Internal Medicine

## 2015-09-10 VITALS — BP 130/78 | HR 73 | Ht 66.0 in | Wt 229.6 lb

## 2015-09-10 DIAGNOSIS — G4733 Obstructive sleep apnea (adult) (pediatric): Secondary | ICD-10-CM

## 2015-09-10 DIAGNOSIS — I6523 Occlusion and stenosis of bilateral carotid arteries: Secondary | ICD-10-CM

## 2015-09-10 DIAGNOSIS — J449 Chronic obstructive pulmonary disease, unspecified: Secondary | ICD-10-CM

## 2015-09-10 NOTE — Patient Instructions (Signed)
Order     dme Advanced   Ok to d/c portable O2.   Continue sleep O2  2L/ min   Please call as needed

## 2015-09-10 NOTE — Progress Notes (Signed)
04/10/11- 82 year old male former smoker, Veteran, who had been a patient of mine in the old [employer]. He is coming to reestablish after moving back from [place] to [place]. History of asthma, COPD, obstructive sleep apnea complicated by DM, obesity, CAD/ischemic CM/sick sinus/bradycardia tachycardia with pacemaker, history of prostate cancer, anemia. He reports feeling stable today. New carpet smell in his apartment is irritating to his breathing but he denies routine cough or recent bronchitis. He has not felt the need to use his bronchodilators in 4 months. Walking distances limited more by degenerative joint hip pain. He can walk a maximum of 10 minutes on level ground and very little on hills or stairs. He denies blood, adenopathy, discolored sputum or swelling. He usually has trace ankle edema with no history of venous thromboembolic disease. CAD treated with CABG, stent and pacemaker but he has not had infarction. Did have pneumonia at age 62. He declines flu vaccine. Has had pneumonia vaccine. Long-term diagnosis of sleep apnea. NPSG 01/27/06-AHI 59.9 per hour. CPAP 12 plus oxygen 2 L for sleep, with good compliance and control. He will want to change to a local provider.  07/14/11- 82 year old male former smoker, Veteran,  History of asthma, COPD, obstructive sleep apnea complicated by DM, obesity, CAD/ischemic CM/sick sinus/bradycardia tachycardia with pacemaker, history of prostate cancer, anemia He had his first flu vaccine in 10 years this winter and avoided any significant respiratory infection. He no longer thinks he needs daytime oxygen. He is wearing it only at night, with his CPAP. Scheduled for right hip replacement in March. Chest x-ray 04/15/2011-no acute process, pacemaker PFT 04/23/2011-mild COPD  08/14/11-  82 year old male former smoker, Veteran,  History of asthma, COPD, obstructive sleep apnea complicated by DM, obesity, CAD/ischemic CM/sick sinus/bradycardia tachycardia/ pacemaker,  history of prostate cancer, anemia He is pending hip replacement by Dr. Tonita Cong on March 15. Asks prior authorization. I explained his increased risk of cardiopulmonary complications from any surgery and recognition that he is probably stable and near his best function now for necessary surgery. He is trying to move from his current apartment to a smoke free facility. A neighbor smokes heavily and the tobacco odor comes into the air conditioning ducts. He continues good compliance and symptom control using CPAP 12 with O2 2 L every night. He will use those settings while he is at the hospital for sleep and in recovery after extubation.  10/26/11- 82 year old male former smoker, Veteran,  History of asthma, COPD, obstructive sleep apnea complicated by DM, obesity, CAD/ischemic CM/sick sinus/bradycardia tachycardia/ pacemaker, history of prostate cancer, anemia Pt states having a productive cough  yellowish brown ,,increase sob wheezing ,chest congestion, cold chills .  Had right total hip replacement and finished physical therapy. Pain still limits walking. No respiratory complications. Now for 3 days had increased cough, yellow sputum, shortness of breath, wheeze, cold chills. Continued reflux symptoms on Nexium once daily. Discussion of steroid therapy and side effects.  11/05/11- 82 year old male former smoker, Veteran,  History of asthma, COPD, obstructive sleep apnea complicated by DM, obesity, CAD/ischemic CM/sick sinus/bradycardia tachycardia/ pacemaker, history of prostate cancer, anemia ACUTE VISIT: SOB and cough-productive yellow in color.Would like Zpak. Seen 10 days ago and Depo-Medrol and Z-Pak did help at that visit. Comfortable with CPAP. He takes it off to cough.  01/04/12- 82 year old male former smoker, Veteran,  History of asthma, COPD, obstructive sleep apnea complicated by DM, obesity, CAD/ischemic CM/sick sinus/bradycardia tachycardia/ pacemaker, history of prostate cancer,  anemia. Needs recert for O2 at night per  [facility]; pt states he feels like he needs O2 during the day as well. He says he sleeps and feels better when  using oxygen. Continue CPAP 12/Advanced, with oxygen at 2 L currently. COPD assessment test (CAT) 31/40.  03/07/12- 82 year old male former smoker, Veteran,  History of asthma, COPD, obstructive sleep apnea complicated by DM, obesity, CAD/ischemic CM/sick sinus/bradycardia tachycardia/ pacemaker, history of prostate cancer, anemia. Has good and bad days; able to move through the home with O2 (3L) on. Has had flu vaccine He qualified for home oxygen and uses it 90% of the time. Needs to wear it to clean his apartment. CPAP 12 "can't sleep without it". Walks laps in parking lot for exercise.  09/13/12- 82-year-old male former smoker, Veteran,  History of asthma, COPD, obstructive sleep apnea complicated by DM, obesity, CAD/ischemic CM/sick sinus/bradycardia tachycardia/ pacemaker, history of prostate cancer, anemia. Follows For:  Requalify for O2 for [facility] -  Pt doesnt feel like he needs O2 - SOB with increased housework activity - PT had fall 2 weeks ago and hurt back - Scheduled for CT this week Isn't sure he needs portable oxygen but does have concentrator in his house for sleep and as needed. He can't sleep without CPAP 12/ APS all night, every night, O2 2L/ Advanced. CXR 11/05/11 IMPRESSION:  Bibasilar bronchiectasis with new associated nodular air space  disease, indicative of an infectious bronchiolitis or  bronchopneumonia.  Original Report Authenticated By: Luretha Rued, M.D.  03/16/13-  82 year old male former smoker, Veteran,  History of asthma, COPD, obstructive sleep apnea complicated by DM, obesity, CAD/ischemic CM/sick sinus/bradycardia tachycardia/ pacemaker, history of prostate cancer, anemia. CPAP 12 / O2 2L/ APS sleep. Would like not to need O2 for exertion.  needs o2 recertified.  Breathing is unchanged.  Wearing cpap every night.  No  problems with mask or pressure.   Dizzy if he stands quickly and questions blood pressure medicines. Sees Dr Asa Lente tomorrow. CXR 02/02/13 IMPRESSION:  No acute abnormalities.  Original Report Authenticated By: Lorriane Shire, M.D. Exercise oximetry 03/16/13- rest RA 93%, walking RA 87%, Walking 2L O2 98%.   09/14/13-  82 year old male former smoker, Veteran,  History of asthma, COPD, obstructive sleep apnea complicated by DM, obesity, CAD/ischemic CM/sick sinus/bradycardia tachycardia/ pacemaker, history of prostate cancer, anemia. CPAP 12 / O2 2L/ Advanced sleep. FOLLOWS FOR: back pain continues to make it hard to breath. Otherwise continues to wear O2 during day on pulsing and O2 concentrator at night. Desat on room air at rest today to 88%. CXR 07/17/13 IMPRESSION:  Sequential pacemaker in place. Post CABG. Cardiomegaly.  Calcified tortuous aorta.  Mild central pulmonary vascular prominence stable.  Basilar scarring/subsegmental atelectasis without segmental  consolidation.  Electronically Signed  By: Chauncey Cruel M.D.  On: 07/17/2013 16:32  11/07/13- 82 year old male former smoker, Veteran,  History of asthma, COPD, obstructive sleep apnea complicated by DM, obesity, CAD/ischemic CM/sick sinus/bradycardia tachycardia/ pacemaker, history of prostate cancer, anemia. CPAP 12 / O2 2L/ Advanced sleep. Dyspnea on exertion with pulse oxygen, okay at rest. He worries about being allergic to bushes around his home because he has a "bitter taste" in his mouth. Neighbor upstairs smokes and the odor bothers patient. Continues CPAP all night every night and believes it helps him Walk test on oxygen- 11/07/13- 3 laps x 185 feet, lowest sat 91 % on 2L pulsed O2  01/18/14- 82 year old male former smoker, Veteran,  History of asthma, COPD, obstructive sleep apnea complicated by DM, obesity, CAD/ischemic CM/sick sinus/bradycardia tachycardia/ pacemaker,  history of prostate cancer, anemia. CPAP 12 / O2 2L/  Advanced sleep. FOLLOWS FOR:  acute visit today.  sore throat x 2-3 weeks.  pt stated that when he uses his inhalers he is having burning in his chest and wheezing at times with the inhaler use.   05/09/14-82 year old male former smoker, Veteran,  History of asthma, COPD, obstructive sleep apnea complicated by DM, obesity, CAD/ischemic CM/sick sinus/bradycardia tachycardia/ pacemaker, history of prostate cancer, anemia.  CPAP 12 / O2 2L/ Advanced sleep and exertion FOLLOWS FOR: been to UC and given Doxycycline-completed and no better. Continues to have cough-yellow in color with chest congestion and wheezing. He liked [employer] but questions insurance coverage. Has had worse bronchitis for 3 months-cough with yellow mucus, wheeze, chest congestion. Comes and goes. No better after doxycycline. Also asks small portable oxygen concentrator instead of the portable oxygen system he is now using. CXR 02/09/14 IMPRESSION: Stable chronic findings with no change from 07/17/2013. Electronically Signed  By: Skipper Cliche M.D.  On: 02/09/2014 10:01  07/26/14- 82 year old male former smoker, Veteran,  History of asthma, COPD, obstructive sleep apnea complicated by DM, obesity, CAD/ischemic CM/sick sinus/bradycardia tachycardia/ pacemaker, history of prostate cancer, anemia. CPAP 12 / O2 2L/ Advanced sleep and exertion. FOLLOWS FOR: Continues O2; Pt states he is coughing up yellow and thick phelgm since last summer.  Has been using Robtussion DM and Mucinex; has hard time taling due to congestion. He was treated with Augmentin December 12 and doxycycline January 21. History of rash with Cipro. He does not recognize reflux or choking on food.  09/10/14- 82 year old male former smoker, Veteran,  History of asthma, COPD, obstructive sleep apnea complicated by DM, obesity, CAD/ischemic CM/sick sinus/bradycardia tachycardia/ pacemaker, history of prostate cancer, anemia.  CPAP 12 / O2 2L/ Advanced sleep and  exertion. FOLLOWS FOR: Pt states that breathing has been doing well. Using O2 @ 2L/min. Pt states that pollen affects his SOB some.  . Uses CPAP all night every night. Sometimes skips oxygen if he goes out shopping. Little cough or wheeze. He feels he is doing very well now. CXR 07/27/14- IMPRESSION: No edema or consolidation. Mild bibasilar scarring Electronically Signed  By: Lowella Grip III M.D.  On: 07/27/2014 08:55  03/12/15-  82 year old male former smoker, Veteran,  History of asthma, COPD, obstructive sleep apnea complicated by DM, obesity, CAD/ischemic CM/sick sinus/bradycardia tachycardia/ pacemaker, history of prostate cancer, anemia.  CPAP 12 / O2 2L/ Advanced sleep and exertion. FOLLOWS FOR: Pt states he coughs occasionally-productive-white in color. No respiratory complications during sigmoid colectomy for diverticulitis with permanent left colostomy earlier this year. Did have a postoperative pneumonia at the nursing home. CXR 12/10/14 IMPRESSION: Mild cardiomegaly. Chronic mild pulmonary venous hypertension without overt edema. Scarring in the lower lobes. No acute cardiopulmonary disease. Stable examination. Electronically Signed  By: Evangeline Dakin M.D.  On: 12/10/2014 12:21  09/10/2015-82 year old male former smoker, Veteran, followed for asthma/COPD, OSA, complicated by DM, obesity, CAD/ischemic CM/sick sinus/bradycardia tachycardia/pacemaker, history of prostate cancer, anemia CPAP 12/O2 2 L sleep and exertion/Advanced He remains compliant with CPAP and feels better using it. No problems offered at this visit. CXR 07/27/2014-COPD/chronic bronchitis. NAD  ROS-see HPI Constitutional:   No-   weight loss, night sweats, fevers, chills, fatigue, lassitude. HEENT:   No-  headaches, difficulty swallowing, tooth/dental problems, sore throat,       No-  sneezing, itching, ear ache, nasal congestion, post nasal drip,  CV:  No- recent  chest pain, orthopnea, PND.  +Mild chronic  swelling in lower extremities,                                              No- anasarca, dizziness, palpitations Resp: +  shortness of breath with exertion or at rest.               productive cough,   non-productive cough,  No- coughing up of blood.               change in color of mucus.  No- wheezing.   Skin: No-   rash or lesions. GI:  No-   heartburn, indigestion, abdominal pain, nausea, vomiting,  GU:  MS: . Limiting hip pain Neuro-     nothing unusual Psych:  No- change in mood or affect. No depression or anxiety.  No memory loss.  OBJ General- Alert, Oriented, Affect-appropriate, Distress- none acute, overweight, O2 2L Skin- rash-none, lesions- none, excoriation- none Lymphadenopathy- none Head- atraumatic            Eyes- Gross vision intact, PERRLA, conjunctivae clear secretions            Ears- Hearing aids, HOH            Nose- Clear, no-Septal dev, mucus, polyps, erosion, perforation             Throat- Mallampati II , mucosa clear , drainage- none, tonsils- atrophic, +dentures                     Neck- flexible , trachea midline, no stridor , thyroid nl, carotid no bruit Chest - symmetrical excursion , unlabored           Heart/CV- RRR +bigeminal pulse , no murmur , no gallop  , no rub, nl s1 s2                           - JVD- none , edema- none, stasis changes- none, varices- none           Lung- +few rhonchi, unlabored, wheeze- none, cough + harsh/ deep,   dullness-none, rub-  none. Raspy .           Chest wall- + pacemaker left chest Abd- Br/ Gen/ Rectal- Not done, not indicated Extrem- cyanosis- none, clubbing, none, atrophy- none, strength- deconditioned, +cane Neuro- grossly intact to observation

## 2015-09-13 ENCOUNTER — Telehealth: Payer: Self-pay | Admitting: Pharmacist

## 2015-09-13 NOTE — Telephone Encounter (Signed)
09/13/15: Attempted to reach patient for follow up on oral medication: Sprycel. No answer. Left VM for patient to call back with any questions or issues. Received patients free drug from [facility] today and let patient know on the message that he can come to the [facility] to pick up. The medication is in my office  Thank you,  Montel Clock, PharmD, [facility] [phone]

## 2015-09-16 ENCOUNTER — Telehealth: Payer: Self-pay | Admitting: Internal Medicine

## 2015-09-16 NOTE — Telephone Encounter (Signed)
He can pick up the stool cards from [place] to test for blood but would need a visit to evaluate the cause.

## 2015-09-16 NOTE — Telephone Encounter (Signed)
What do you suggest for this patient? Please advise, thanks.

## 2015-09-16 NOTE — Telephone Encounter (Signed)
Patient will pick up tomorrow.

## 2015-09-16 NOTE — Telephone Encounter (Signed)
Pt has black stool because of his chemo medicine and he called them and they told him to call you to get something so he can do a smear and check for blood.  He is stopping at the cancer center in just a little bit and was hoping to stop by then. Can you please give him a call.

## 2015-09-17 ENCOUNTER — Telehealth: Payer: Self-pay | Admitting: Internal Medicine

## 2015-09-17 DIAGNOSIS — R195 Other fecal abnormalities: Secondary | ICD-10-CM

## 2015-09-17 NOTE — Telephone Encounter (Signed)
patient states that he has been having blood in stool and thinks that it could be from a med that his oncologist prescribed him. He states that his oncologist is aware of this. Patient is wanting to know if he could "pass out" from the blood loss? He states that he is not having any other symptoms.

## 2015-09-17 NOTE — Telephone Encounter (Signed)
Left message for patient to call back New labs entered

## 2015-09-17 NOTE — Telephone Encounter (Signed)
Have him do a cbc re: dark stools and anemia of chronic disease

## 2015-09-17 NOTE — Telephone Encounter (Signed)
Patient reports dark stools.  He denies any SOB, dizziness, CP or other symptoms.  He reports that his oncologist is aware of the dark stools, they are going to make changes to his treatment plan if black stools don't clear up by Friday according to Eric [name].  Dr. Sharlet Salina has a note in the system to do stool cards and set up an appt with her.  Patient advised to follow through with the instructions from Dr. Sharlet Salina.  Dr. Carlean Purl do you have anything to add?

## 2015-09-18 ENCOUNTER — Other Ambulatory Visit (INDEPENDENT_AMBULATORY_CARE_PROVIDER_SITE_OTHER): Payer: Medicare Other

## 2015-09-18 ENCOUNTER — Ambulatory Visit (INDEPENDENT_AMBULATORY_CARE_PROVIDER_SITE_OTHER): Payer: Medicare Other | Admitting: Internal Medicine

## 2015-09-18 ENCOUNTER — Encounter: Payer: Self-pay | Admitting: Internal Medicine

## 2015-09-18 VITALS — BP 140/60 | HR 70 | Ht 66.0 in | Wt 228.0 lb

## 2015-09-18 DIAGNOSIS — D62 Acute posthemorrhagic anemia: Secondary | ICD-10-CM

## 2015-09-18 DIAGNOSIS — K921 Melena: Secondary | ICD-10-CM

## 2015-09-18 DIAGNOSIS — R195 Other fecal abnormalities: Secondary | ICD-10-CM

## 2015-09-18 DIAGNOSIS — I6523 Occlusion and stenosis of bilateral carotid arteries: Secondary | ICD-10-CM

## 2015-09-18 LAB — CBC WITH DIFFERENTIAL/PLATELET
BASOS PCT: 0.3 % (ref 0.0–3.0)
Basophils Absolute: 0 10*3/uL (ref 0.0–0.1)
EOS ABS: 0.1 10*3/uL (ref 0.0–0.7)
Eosinophils Relative: 3.2 % (ref 0.0–5.0)
HCT: 27.4 % — ABNORMAL LOW (ref 39.0–52.0)
HEMOGLOBIN: 9.6 g/dL — AB (ref 13.0–17.0)
Lymphocytes Relative: 11.3 % — ABNORMAL LOW (ref 12.0–46.0)
Lymphs Abs: 0.4 10*3/uL — ABNORMAL LOW (ref 0.7–4.0)
MCHC: 34.9 g/dL (ref 30.0–36.0)
MCV: 96.8 fl (ref 78.0–100.0)
MONO ABS: 0.5 10*3/uL (ref 0.1–1.0)
Monocytes Relative: 14.8 % — ABNORMAL HIGH (ref 3.0–12.0)
NEUTROS PCT: 70.4 % (ref 43.0–77.0)
Neutro Abs: 2.5 10*3/uL (ref 1.4–7.7)
PLATELETS: 216 10*3/uL (ref 150.0–400.0)
RBC: 2.83 Mil/uL — ABNORMAL LOW (ref 4.22–5.81)
RDW: 18 % — ABNORMAL HIGH (ref 11.5–15.5)
WBC: 3.5 10*3/uL — AB (ref 4.0–10.5)

## 2015-09-18 NOTE — Telephone Encounter (Signed)
Left message for patient to call back  

## 2015-09-18 NOTE — Progress Notes (Signed)
Quick Note:  Seen in office today - stools now brown - slight bleeding at ostomy Good story for melena - he stopped Sprycel - advised I would copy Dr. Alvy Bimler Considered adding a PPI since on 81 mg ASA and on H2 B but if he still needs Sprycel it can interact with PPI so did not do that ______

## 2015-09-18 NOTE — Progress Notes (Signed)
   Subjective:    Patient ID: Eric [name], male    DOB: 1927-03-17, 82 y.o.   MRN: [id number] Cc: black stools  HPI Called office this week c/o several; days of black stools in ostomy. Thought it was related to Sprycel. He says he spoke to someone at cancer center who said 1 in a million that bleeding from Sprycel occurs and to call back Friday if it continues. He stopped the medication 3 d ago and the black stools stopped. hgb down to 9.6 from 12.1 (2/28) No orthostasis, CP DOE havuing slight bleeding (red) at ostomy today No pepto bismol, uses 81 mg ASA no other NSAID On H2B not PPI ([facility])  Medications, allergies, past medical history, past surgical history, family history and social history are reviewed and updated in the EMR.  Review of Systems As above    Objective:   Physical Exam  BP 140/60 mmHg  Pulse 70  Ht 5\' 6"  (1.676 m)  Wt 228 lb (103.42 kg)  BMI 36.82 kg/m2 Elderly wm NAD abd LLQ colostomy - bag on brown stool in bag, left upper outer edge slight rectal bleeding , mild-mod peristomal hernia      Assessment & Plan:   Encounter Diagnoses  Name Primary?  . Melena Yes  . Acute blood loss anemia     Seems like he has had melena and blood loss that is resolved. ? from Sprycel - certainly possible. Agreee w/ holding for now.  Considered adding a PPI and dc H2 B given melena and 81 mg ASA but if he needs to stay on Sprycel (expect rechallenge appropriate) PPI can reduce effectiveness so held off on that  Will see what Dr. Alvy Bimler think  Could do an EGD if she thinks needed.   Cc: Heath Lark, MD

## 2015-09-18 NOTE — Progress Notes (Signed)
Quick Note:  Hgb is down sig - sounds like he is GI bleeding He needs to be evaluated - can anyone see him in office early PM ? If not I will see him after scoping If he needs to go to ED ______

## 2015-09-18 NOTE — Telephone Encounter (Signed)
Patient will come in and see Dr. Carlean Purl this afternoon

## 2015-09-18 NOTE — Patient Instructions (Addendum)
   Stop your Sprycel and talk with Dr Alvy Bimler office next week.   Dr Carlean Purl is going to talk to the Dr Alvy Bimler as well.   I appreciate the opportunity to care for you.

## 2015-09-19 ENCOUNTER — Telehealth: Payer: Self-pay | Admitting: *Deleted

## 2015-09-19 NOTE — Telephone Encounter (Signed)
Pt returned call and reports he "still has some bleeding" but his stools are not as dark as they were before.  He is holding his Sprycel for now and got our message to resume on Monday.

## 2015-09-19 NOTE — Telephone Encounter (Signed)
-----   Message from Heath Lark, MD sent at 09/19/2015  8:29 AM EDT ----- Regarding: resume sprycel PLs call the patient to resume sprycel on Monday next week and call us if bleeding occurs again. Sprycel is not the cause of his bleeding, more likely it's the aspirin

## 2015-09-19 NOTE — Telephone Encounter (Signed)
LVM for pt informing of Dr. Calton Dach message below.  Asked him to return nurse's call for any questions or if bleeding occurs again.

## 2015-09-23 ENCOUNTER — Telehealth: Payer: Self-pay | Admitting: *Deleted

## 2015-09-23 NOTE — Telephone Encounter (Signed)
Pt reports his stools have turned back to brown, with no signs of bleeding.  He resumed his Spyrcel on Saturday (although was instructed to wait until today).  Instructed pt to continue Sprycel and let us know and GI MD if any further bleeding or any other problems before next appt.   He verbalized understanding.

## 2015-09-27 DIAGNOSIS — H04123 Dry eye syndrome of bilateral lacrimal glands: Secondary | ICD-10-CM | POA: Diagnosis not present

## 2015-09-27 DIAGNOSIS — H02054 Trichiasis without entropian left upper eyelid: Secondary | ICD-10-CM | POA: Diagnosis not present

## 2015-09-27 DIAGNOSIS — H02051 Trichiasis without entropian right upper eyelid: Secondary | ICD-10-CM | POA: Diagnosis not present

## 2015-09-27 DIAGNOSIS — H02055 Trichiasis without entropian left lower eyelid: Secondary | ICD-10-CM | POA: Diagnosis not present

## 2015-10-01 ENCOUNTER — Encounter: Payer: Self-pay | Admitting: *Deleted

## 2015-10-01 DIAGNOSIS — M47816 Spondylosis without myelopathy or radiculopathy, lumbar region: Secondary | ICD-10-CM | POA: Diagnosis not present

## 2015-10-01 NOTE — Progress Notes (Signed)
Continued Treatment Confirmation form for Sprycel signed by Dr. Alvy Bimler and faxed back to [name].  Pt is still in need of assistance

## 2015-10-20 ENCOUNTER — Emergency Department (HOSPITAL_COMMUNITY)
Admission: EM | Admit: 2015-10-20 | Discharge: 2015-10-20 | Disposition: A | Discharge status: Home / Self Care | Payer: Medicare Other | Attending: Emergency Medicine | Admitting: Emergency Medicine

## 2015-10-20 ENCOUNTER — Encounter (HOSPITAL_COMMUNITY): Payer: Self-pay

## 2015-10-20 ENCOUNTER — Emergency Department (HOSPITAL_COMMUNITY): Payer: Medicare Other

## 2015-10-20 DIAGNOSIS — J45909 Unspecified asthma, uncomplicated: Secondary | ICD-10-CM | POA: Diagnosis not present

## 2015-10-20 DIAGNOSIS — K5790 Diverticulosis of intestine, part unspecified, without perforation or abscess without bleeding: Secondary | ICD-10-CM | POA: Diagnosis not present

## 2015-10-20 DIAGNOSIS — E1151 Type 2 diabetes mellitus with diabetic peripheral angiopathy without gangrene: Secondary | ICD-10-CM | POA: Insufficient documentation

## 2015-10-20 DIAGNOSIS — Y929 Unspecified place or not applicable: Secondary | ICD-10-CM | POA: Insufficient documentation

## 2015-10-20 DIAGNOSIS — K219 Gastro-esophageal reflux disease without esophagitis: Secondary | ICD-10-CM | POA: Diagnosis not present

## 2015-10-20 DIAGNOSIS — M25531 Pain in right wrist: Secondary | ICD-10-CM | POA: Diagnosis not present

## 2015-10-20 DIAGNOSIS — M1990 Unspecified osteoarthritis, unspecified site: Secondary | ICD-10-CM | POA: Insufficient documentation

## 2015-10-20 DIAGNOSIS — Z951 Presence of aortocoronary bypass graft: Secondary | ICD-10-CM | POA: Insufficient documentation

## 2015-10-20 DIAGNOSIS — Z7982 Long term (current) use of aspirin: Secondary | ICD-10-CM | POA: Insufficient documentation

## 2015-10-20 DIAGNOSIS — Y939 Activity, unspecified: Secondary | ICD-10-CM | POA: Insufficient documentation

## 2015-10-20 DIAGNOSIS — E785 Hyperlipidemia, unspecified: Secondary | ICD-10-CM | POA: Insufficient documentation

## 2015-10-20 DIAGNOSIS — C9210 Chronic myeloid leukemia, BCR/ABL-positive, not having achieved remission: Secondary | ICD-10-CM | POA: Insufficient documentation

## 2015-10-20 DIAGNOSIS — I10 Essential (primary) hypertension: Secondary | ICD-10-CM | POA: Insufficient documentation

## 2015-10-20 DIAGNOSIS — E669 Obesity, unspecified: Secondary | ICD-10-CM | POA: Insufficient documentation

## 2015-10-20 DIAGNOSIS — Z7951 Long term (current) use of inhaled steroids: Secondary | ICD-10-CM | POA: Insufficient documentation

## 2015-10-20 DIAGNOSIS — Z96641 Presence of right artificial hip joint: Secondary | ICD-10-CM | POA: Diagnosis not present

## 2015-10-20 DIAGNOSIS — C61 Malignant neoplasm of prostate: Secondary | ICD-10-CM | POA: Insufficient documentation

## 2015-10-20 DIAGNOSIS — K589 Irritable bowel syndrome without diarrhea: Secondary | ICD-10-CM | POA: Insufficient documentation

## 2015-10-20 DIAGNOSIS — Z6836 Body mass index (BMI) 36.0-36.9, adult: Secondary | ICD-10-CM | POA: Insufficient documentation

## 2015-10-20 DIAGNOSIS — Y999 Unspecified external cause status: Secondary | ICD-10-CM | POA: Insufficient documentation

## 2015-10-20 DIAGNOSIS — Z950 Presence of cardiac pacemaker: Secondary | ICD-10-CM | POA: Insufficient documentation

## 2015-10-20 DIAGNOSIS — I2510 Atherosclerotic heart disease of native coronary artery without angina pectoris: Secondary | ICD-10-CM | POA: Diagnosis not present

## 2015-10-20 DIAGNOSIS — Z79899 Other long term (current) drug therapy: Secondary | ICD-10-CM | POA: Diagnosis not present

## 2015-10-20 DIAGNOSIS — J449 Chronic obstructive pulmonary disease, unspecified: Secondary | ICD-10-CM | POA: Diagnosis not present

## 2015-10-20 DIAGNOSIS — W010XXA Fall on same level from slipping, tripping and stumbling without subsequent striking against object, initial encounter: Secondary | ICD-10-CM | POA: Diagnosis not present

## 2015-10-20 DIAGNOSIS — Z87891 Personal history of nicotine dependence: Secondary | ICD-10-CM | POA: Insufficient documentation

## 2015-10-20 DIAGNOSIS — S6991XA Unspecified injury of right wrist, hand and finger(s), initial encounter: Secondary | ICD-10-CM | POA: Diagnosis not present

## 2015-10-20 MED ORDER — INDOMETHACIN 25 MG PO CAPS: 25.0000 mg | ORAL_CAPSULE | Freq: Three times a day (TID) | ORAL | Status: DC | PRN

## 2015-10-20 MED ORDER — CEPHALEXIN 500 MG PO CAPS: 500.0000 mg | ORAL_CAPSULE | Freq: Four times a day (QID) | ORAL | Status: DC

## 2015-10-20 NOTE — ED Notes (Signed)
Patient transported to X-ray 

## 2015-10-20 NOTE — ED Provider Notes (Signed)
CSN: [id number]     Arrival date & time 10/20/15  0707 History   First MD Initiated Contact with Patient 10/20/15 [phone]     Chief Complaint  Patient presents with  . Fall  . Wrist Pain     (Consider location/radiation/quality/duration/timing/severity/associated sxs/prior Treatment) HPI Comments: Patient is an 82 year old male who presents with complaints of right wrist pain. This started 2 days ago and is worsening. He has redness to the top of his wrist. He reports a fall approximately 10 days ago during which he sustained a knee abrasion and abrasions to his hand. He denies hurting his wrist at that time.  Patient is a 82 y.o. male presenting with wrist pain. The history is provided by the patient.  Wrist Pain This is a new problem. The current episode started 2 days ago. The problem occurs constantly. The problem has not changed since onset.Pertinent negatives include no chest pain. Exacerbated by: Movement and palpation. Nothing relieves the symptoms. He has tried nothing for the symptoms. The treatment provided no relief.    Past Medical History  Diagnosis Date  . LACTOSE INTOLERANCE   . OBESITY   . CARDIOMYOPATHY, ISCHEMIC   . AORTIC SCLEROSIS   . SICK SINUS/ TACHY-BRADY SYNDROME 09/2007    s/p PPM st judes  . PERIPHERAL VASCULAR DISEASE   . CAROTID BRUIT, RIGHT 02/27/2008  . IBS (irritable bowel syndrome)   . ALLERGIC RHINITIS   . ANEMIA-NOS   . OA (osteoarthritis)   . COPD   . GERD   . HYPERLIPIDEMIA   . HIATAL HERNIA   . Diverticulosis   . Prostate cancer ([place])     seed implants 2004  . DIABETES MELLITUS-TYPE II     diet controlled  . CORONARY ARTERY DISEASE     CABG 1995, PTCA/DES 2008, 2009 and 08/2010  . HYPERTENSION   . Asthma   . Sleep apnea   . Partial small bowel obstruction ([place])   . Primary hyperparathyroidism ([place])     Lab Results Component Value Date  PTH 150.7* 02/13/2013  CALCIUM 11.0* 02/13/2013  CAION 1.21 03/15/2008    . SMALL BOWEL OBSTRUCTION  04/18/2009    Qualifier: History of  By: Asa Lente MD, Jannifer Rodney Cataract     surgery  . Symptomatic diverticulosis 01/18/2009    Qualifier: Diagnosis of  By: Trellis Paganini PA-c, Amy S   . Hx of echocardiogram     Echo (9/15):  Mild LVH, EF 50-55%, no RWMA, Gr 1 DD, MAC, mild LAE.  . Diastolic dysfunction, Grade 1 11/24/2014  . Diverticulitis of colon with perforation 11/22/2014  . Hyponatremia 11/22/2014  . CML (chronic myeloid leukemia) ([place]) 06/26/2015   Past Surgical History  Procedure Laterality Date  . Ptca  2008, 2009, 2012    with DES  . Partial small bowel obstruction  2009  . Bilateral cataracts    . Pacemaker insertion      DDD/[employer]         Last interrogation 2/13  on chart     Pacemaker guideline order Dr Tamala Julian on chart  . Inguinal hernia repair Bilateral   . Lumbar disc surgery  12/2008  . Coronary artery bypass graft    . Total hip arthroplasty  08/21/2011    Procedure: TOTAL HIP ARTHROPLASTY;  Surgeon: Johnn Hai, MD;  Location: [facility];  Service: Orthopedics;  Laterality: Right;  . Colonoscopy    . Esophagogastroduodenoscopy  multiple  . Penile prosthesis placement    .  Back surgery    . Colon surgery    . Flexible sigmoidoscopy N/A 09/22/2013    Procedure: FLEXIBLE SIGMOIDOSCOPY;  Surgeon: Gatha Mayer, MD;  Location: [facility];  Service: Endoscopy;  Laterality: N/A;  . Colon resection N/A 11/28/2014    Procedure: EXPLORATORY LAPAROTOMY, SIGMOID COLECTOMY WITH COLOSTOMY;  Surgeon: Jackolyn Confer, MD;  Location: [facility];  Service: General;  Laterality: N/A;   Family History  Problem Relation Age of Onset  . Colon cancer Neg Hx   . Hypertension Mother   . Heart attack    . Heart attack    . Stroke Neg Hx   . Heart attack Brother   . Cancer Mother    Social History  Substance Use Topics  . Smoking status: Former Smoker -- 1.00 packs/day for 25 years    Types: Cigarettes    Quit date: 06/08/1994  . Smokeless tobacco: Never Used  . Alcohol  Use: No    Review of Systems  Cardiovascular: Negative for chest pain.  All other systems reviewed and are negative.     Allergies  Actos; Celebrex; Demerol; Morphine and related; Ciprofloxacin; Metformin; and Zocor  Home Medications   Prior to Admission medications   Medication Sig Start Date End Date Taking? Authorizing Provider  albuterol (PROVENTIL HFA) 108 (90 BASE) MCG/ACT inhaler Inhale 2 puffs into the lungs every 4 (four) hours as needed for shortness of breath. Wheezing    Yes Historical Provider, MD  albuterol (PROVENTIL) (2.5 MG/3ML) 0.083% nebulizer solution USE 1 VIAL WITH NEBULIZER EVERY 4 HOURS AS NEEDED FOR WHEEZING 04/13/14  Yes Deneise Lever, MD  aspirin 81 MG tablet Take 81 mg by mouth daily.   Yes Historical Provider, MD  dasatinib (SPRYCEL) 70 MG tablet Take 1 tablet (70 mg total) by mouth daily. 06/26/15  Yes Heath Lark, MD  fluticasone (FLONASE) 50 MCG/ACT nasal spray Place 2 sprays into both nostrils daily. Allergies 08/01/14  Yes [name], MD  glucose blood (ONE TOUCH TEST STRIPS) test strip Use to check blood sugars once a day Dx E11.9 08/26/15  Yes [name], MD  hydrochlorothiazide (MICROZIDE) 12.5 MG capsule Take 1 capsule (12.5 mg total) by mouth daily. 07/10/15  Yes Belva Crome, MD  isosorbide mononitrate (IMDUR) 30 MG 24 hr tablet Take 1 tablet (30 mg total) by mouth every evening. 07/01/15  Yes [name], MD  nitroGLYCERIN (NITROSTAT) 0.4 MG SL tablet Place 0.4 mg under the tongue every 5 (five) minutes as needed for chest pain. Chest pain    Yes Historical Provider, MD  Polyethyl Glycol-Propyl Glycol (SYSTANE) 0.4-0.3 % SOLN Apply 2 drops to eye 4 (four) times daily.    Yes Historical Provider, MD  pravastatin (PRAVACHOL) 20 MG tablet Take 3 tablets (60 mg total) by mouth daily. 08/01/14  Yes [name], MD  ranitidine (ZANTAC) 300 MG tablet Take 300 mg by mouth daily.    Yes Historical Provider, MD   triamcinolone cream (KENALOG) 0.1 % Apply 1 application topically 3 (three) times daily as needed (dry skin.).  07/19/15  Yes Historical Provider, MD  valsartan (DIOVAN) 160 MG tablet Take 1 tablet (160 mg total) by mouth daily. 08/01/14  Yes [name], MD   BP 154/59 mmHg  Pulse 70  Temp(Src) 98.3 F (36.8 C) (Oral)  Resp 20  Ht 5\' 5"  (1.651 m)  Wt 220 lb (99.791 kg)  BMI 36.61 kg/m2  SpO2 100% Physical Exam  Constitutional: He is  oriented to person, place, and time. He appears well-developed and well-nourished. No distress.  HENT:  Head: Normocephalic and atraumatic.  Neck: Normal range of motion. Neck supple.  Musculoskeletal:  There is swelling, warmth, and erythema over the distal right forearm. He has good range of motion of the wrist with some discomfort. There is otherwise no deformity. Cap refill, motor, and sensory are intact.  Neurological: He is alert and oriented to person, place, and time.  Skin: Skin is warm and dry. He is not diaphoretic.  Nursing note and vitals reviewed.   ED Course  Procedures (including critical care time) Labs Review Labs Reviewed - No data to display  Imaging Review No results found. I have personally reviewed and evaluated these images and lab results as part of my medical decision-making.   EKG Interpretation None      MDM   Final diagnoses:  None    This appears to be a cellulitis to the forearm or less likely a flareup of gout. I highly doubt a septic joint. He has no fever and has good range of motion. He will be treated with Keflex and indomethacin. He is to return or follow-up with his primary Dr. if not improving in the next 2-3 days.    Veryl Speak, MD 10/20/15 [phone]

## 2015-10-20 NOTE — ED Notes (Signed)
Patient states he stumbled on his own feet approx 10 days ago and landed on his right wrist. Patient has swelling and increased pain to the right wrist.

## 2015-10-20 NOTE — ED Notes (Signed)
MD at bedside. 

## 2015-10-20 NOTE — Discharge Instructions (Signed)
Indomethacin as prescribed. Please take this medication with food.  Keflex as prescribed.  Return to the emergency department if you develop worsening pain, redness, swelling, fever, or other new and concerning symptoms.   Wrist Pain There are many things that can cause wrist pain. Some common causes include:  An injury to the wrist area, such as a sprain, strain, or fracture.  Overuse of the joint.  A condition that causes increased pressure on a nerve in the wrist (carpal tunnel syndrome).  Wear and tear of the joints that occurs with aging (osteoarthritis).  A variety of other types of arthritis. Sometimes, the cause of wrist pain is not known. The pain often goes away when you follow your health care provider's instructions for relieving pain at home. If your wrist pain continues, tests may need to be done to diagnose your condition. HOME CARE INSTRUCTIONS Pay attention to any changes in your symptoms. Take these actions to help with your pain:  Rest the wrist area for at least 48 hours or as told by your health care provider.  If directed, apply ice to the injured area:  Put ice in a plastic bag.  Place a towel between your skin and the bag.  Leave the ice on for 20 minutes, 2-3 times per day.  Keep your arm raised (elevated) above the level of your heart while you are sitting or lying down.  If a splint or elastic bandage has been applied, use it as told by your health care provider.  Remove the splint or bandage only as told by your health care provider.  Loosen the splint or bandage if your fingers become numb or have a tingling feeling, or if they turn cold or blue.  Take over-the-counter and prescription medicines only as told by your health care provider.  Keep all follow-up visits as told by your health care provider. This is important. SEEK MEDICAL CARE IF:  Your pain is not helped by treatment.  Your pain gets worse. SEEK IMMEDIATE MEDICAL CARE  IF:  Your fingers become swollen.  Your fingers turn white, very red, or cold and blue.  Your fingers are numb or have a tingling feeling.  You have difficulty moving your fingers.   This information is not intended to replace advice given to you by your health care provider. Make sure you discuss any questions you have with your health care provider.   Document Released: 03/04/2005 Document Revised: 02/13/2015 Document Reviewed: 10/10/2014 Elsevier Interactive Patient Education [employer].

## 2015-10-28 ENCOUNTER — Ambulatory Visit (HOSPITAL_BASED_OUTPATIENT_CLINIC_OR_DEPARTMENT_OTHER): Payer: Medicare Other | Admitting: Hematology and Oncology

## 2015-10-28 ENCOUNTER — Telehealth: Payer: Self-pay | Admitting: Hematology and Oncology

## 2015-10-28 ENCOUNTER — Encounter: Payer: Self-pay | Admitting: Hematology and Oncology

## 2015-10-28 ENCOUNTER — Other Ambulatory Visit: Payer: Self-pay | Admitting: Hematology and Oncology

## 2015-10-28 ENCOUNTER — Other Ambulatory Visit (HOSPITAL_BASED_OUTPATIENT_CLINIC_OR_DEPARTMENT_OTHER): Payer: Medicare Other

## 2015-10-28 VITALS — BP 152/44 | HR 59 | Temp 98.2°F | Resp 18 | Ht 65.0 in | Wt 234.7 lb

## 2015-10-28 DIAGNOSIS — J449 Chronic obstructive pulmonary disease, unspecified: Secondary | ICD-10-CM | POA: Diagnosis not present

## 2015-10-28 DIAGNOSIS — R05 Cough: Secondary | ICD-10-CM

## 2015-10-28 DIAGNOSIS — I10 Essential (primary) hypertension: Secondary | ICD-10-CM | POA: Diagnosis not present

## 2015-10-28 DIAGNOSIS — C9210 Chronic myeloid leukemia, BCR/ABL-positive, not having achieved remission: Secondary | ICD-10-CM | POA: Diagnosis not present

## 2015-10-28 DIAGNOSIS — D61818 Other pancytopenia: Secondary | ICD-10-CM

## 2015-10-28 LAB — COMPREHENSIVE METABOLIC PANEL
ALT: 15 U/L (ref 0–55)
AST: 22 U/L (ref 5–34)
Albumin: 3.8 g/dL (ref 3.5–5.0)
Alkaline Phosphatase: 68 U/L (ref 40–150)
Anion Gap: 8 mEq/L (ref 3–11)
BILIRUBIN TOTAL: 0.65 mg/dL (ref 0.20–1.20)
BUN: 13 mg/dL (ref 7.0–26.0)
CO2: 23 meq/L (ref 22–29)
Calcium: 10 mg/dL (ref 8.4–10.4)
Chloride: 106 mEq/L (ref 98–109)
Creatinine: 1 mg/dL (ref 0.7–1.3)
EGFR: 69 mL/min/{1.73_m2} — AB (ref >90)
GLUCOSE: 107 mg/dL (ref 70–140)
Potassium: 5 mEq/L (ref 3.5–5.1)
SODIUM: 136 meq/L (ref 136–145)
TOTAL PROTEIN: 6.9 g/dL (ref 6.4–8.3)

## 2015-10-28 LAB — CBC WITH DIFFERENTIAL/PLATELET
BASO%: 0 % (ref 0.0–2.0)
Basophils Absolute: 0 10*3/uL (ref 0.0–0.1)
EOS%: 3 % (ref 0.0–7.0)
Eosinophils Absolute: 0.2 10*3/uL (ref 0.0–0.5)
HCT: 29.2 % — ABNORMAL LOW (ref 38.4–49.9)
HGB: 9.6 g/dL — ABNORMAL LOW (ref 13.0–17.1)
LYMPH%: 23.1 % (ref 14.0–49.0)
MCH: 34 pg — ABNORMAL HIGH (ref 27.2–33.4)
MCHC: 32.9 g/dL (ref 32.0–36.0)
MCV: 103.5 fL — AB (ref 79.3–98.0)
MONO#: 0.7 10*3/uL (ref 0.1–0.9)
MONO%: 12.2 % (ref 0.0–14.0)
NEUT%: 61.7 % (ref 39.0–75.0)
NEUTROS ABS: 3.5 10*3/uL (ref 1.5–6.5)
Platelets: 105 10*3/uL — ABNORMAL LOW (ref 140–400)
RBC: 2.82 10*6/uL — AB (ref 4.20–5.82)
RDW: 14.5 % (ref 11.0–14.6)
WBC: 5.7 10*3/uL (ref 4.0–10.3)
lymph#: 1.3 10*3/uL (ref 0.9–3.3)

## 2015-10-28 NOTE — Telephone Encounter (Signed)
appt sch and pt given avs

## 2015-10-28 NOTE — Assessment & Plan Note (Signed)
he will continue current medical management. I recommend close follow-up with primary care doctor for medication adjustment.  

## 2015-10-28 NOTE — Assessment & Plan Note (Signed)
His symptoms are stable on medical management Continue conservative management.

## 2015-10-28 NOTE — Telephone Encounter (Signed)
Added appt...gv and printed appt sched and avs

## 2015-10-28 NOTE — Assessment & Plan Note (Signed)
This is likely due to recent treatment. The patient denies recent history of bleeding such as epistaxis, hematuria or hematochezia. He is asymptomatic from the anemia & thrombocytopenia. I will observe for now.  He does not require transfusion now. I will continue the chemotherapy at current dose without dosage adjustment.  If the anemia gets progressive worse in the future, I might have to delay his treatment or adjust the chemotherapy dose.

## 2015-10-28 NOTE — Assessment & Plan Note (Signed)
He has excellent response to treatment with normalization of his leukocytosis. We will continue same treatment. I will bring him back in 3 months with repeat blood work a week ahead of time to assess response to treatment

## 2015-10-28 NOTE — Progress Notes (Signed)
[place] OFFICE PROGRESS NOTE  Patient Care Team: [name], MD as PCP - General (Internal Medicine) Belva Crome, MD as Consulting Physician (Cardiology) Suella Broad, MD as Consulting Physician (Physical Medicine and Rehabilitation) Deboraha Sprang, MD as Consulting Physician (Cardiology) Gatha Mayer, MD as Consulting Physician (Gastroenterology) Irine Seal, MD as Consulting Physician (Urology) Melida Quitter, MD as Consulting Physician (Otolaryngology) Susa Day, MD (Orthopedic Surgery) Deneise Lever, MD (Pulmonary Disease) Carol Ada, MD (Gastroenterology) Allyn Kenner, MD (Dermatology) Jon Billings, RN as [place] Management  SUMMARY OF ONCOLOGIC HISTORY:   CML (chronic myeloid leukemia) ([place])   06/19/2015 Pathology Results Peripheral blood was positive for BCR/ABL at 45.49% a1a2 and on IS 37.76%   06/20/2015 Bone Marrow Biopsy Accession: [id number] BM biopsy confirmed CML. Cytogenetics showed 8;41 translocation and deletion Y   06/26/2015 -  Chemotherapy He is started on Sprycel for CML    INTERVAL HISTORY: Please see below for problem oriented charting. He returns for further follow-up. He continues to have chronic productive cough of white sputum. Denies recent fever or chills. No further skin rash. No diarrhea. Denies recent chest pain or shortness of breath.  REVIEW OF SYSTEMS:   Constitutional: Denies fevers, chills or abnormal weight loss Eyes: Denies blurriness of vision Ears, nose, mouth, throat, and face: Denies mucositis or sore throat Cardiovascular: Denies palpitation, chest discomfort or lower extremity swelling Gastrointestinal:  Denies nausea, heartburn or change in bowel habits Skin: Denies abnormal skin rashes Lymphatics: Denies new lymphadenopathy or easy bruising Neurological:Denies numbness, tingling or new weaknesses Behavioral/Psych: Mood is stable, no new changes  All other systems were reviewed  with the patient and are negative.  I have reviewed the past medical history, past surgical history, social history and family history with the patient and they are unchanged from previous note.  ALLERGIES:  is allergic to actos; celebrex; demerol; morphine and related; ciprofloxacin; metformin; and zocor.  MEDICATIONS:  Current Outpatient Prescriptions  Medication Sig Dispense Refill  . albuterol (PROVENTIL HFA) 108 (90 BASE) MCG/ACT inhaler Inhale 2 puffs into the lungs every 4 (four) hours as needed for shortness of breath. Wheezing     . albuterol (PROVENTIL) (2.5 MG/3ML) 0.083% nebulizer solution USE 1 VIAL WITH NEBULIZER EVERY 4 HOURS AS NEEDED FOR WHEEZING 75 mL 2  . aspirin 81 MG tablet Take 81 mg by mouth daily.    . dasatinib (SPRYCEL) 70 MG tablet Take 1 tablet (70 mg total) by mouth daily. 30 tablet 9  . fluticasone (FLONASE) 50 MCG/ACT nasal spray Place 2 sprays into both nostrils daily. Allergies 16 g 1  . glucose blood (ONE TOUCH TEST STRIPS) test strip Use to check blood sugars once a day Dx E11.9 100 each 3  . hydrochlorothiazide (MICROZIDE) 12.5 MG capsule Take 1 capsule (12.5 mg total) by mouth daily. 90 capsule 3  . indomethacin (INDOCIN) 25 MG capsule Take 1 capsule (25 mg total) by mouth 3 (three) times daily as needed. 15 capsule 0  . isosorbide mononitrate (IMDUR) 30 MG 24 hr tablet Take 1 tablet (30 mg total) by mouth every evening. 30 tablet 11  . nitroGLYCERIN (NITROSTAT) 0.4 MG SL tablet Place 0.4 mg under the tongue every 5 (five) minutes as needed for chest pain. Chest pain     . Polyethyl Glycol-Propyl Glycol (SYSTANE) 0.4-0.3 % SOLN Apply 2 drops to eye 4 (four) times daily.     . pravastatin (PRAVACHOL) 20 MG tablet Take 3 tablets (60  mg total) by mouth daily. 270 tablet 3  . ranitidine (ZANTAC) 300 MG tablet Take 300 mg by mouth daily.     [name] triamcinolone cream (KENALOG) 0.1 % Apply 1 application topically 3 (three) times daily as needed (dry skin.).   3  .  valsartan (DIOVAN) 160 MG tablet Take 1 tablet (160 mg total) by mouth daily. 90 tablet 3   No current facility-administered medications for this visit.    PHYSICAL EXAMINATION: ECOG PERFORMANCE STATUS: 1 - Symptomatic but completely ambulatory  Filed Vitals:   10/28/15 1128  BP: 152/44  Pulse: 59  Temp: 98.2 F (36.8 C)  Resp: 18   Filed Weights   10/28/15 1128  Weight: 234 lb 11.2 oz (106.459 kg)    GENERAL:alert, no distress and comfortable. He is moderately obese. SKIN: skin color, texture, turgor are normal, no rashes or significant lesions. Noted skin bruising EYES: normal, Conjunctiva are pink and non-injected, sclera clear OROPHARYNX:no exudate, no erythema and lips, buccal mucosa, and tongue normal  NECK: supple, thyroid normal size, non-tender, without nodularity LYMPH:  no palpable lymphadenopathy in the cervical, axillary or inguinal LUNGS: Mild bibasilar crackles, no wheezes with normal breathing effort HEART: regular rate & rhythm and no murmurs and no lower extremity edema ABDOMEN:abdomen soft, non-tender and normal bowel sounds Musculoskeletal:no cyanosis of digits and no clubbing  NEURO: alert & oriented x 3 with fluent speech, no focal motor/sensory deficits  LABORATORY DATA:  I have reviewed the data as listed    Component Value Date/Time   NA 136 10/28/2015 1113   NA 136 07/31/2015 1024   NA 135* 04/11/2014   K 5.0 10/28/2015 1113   K 4.1 07/31/2015 1024   CL 103 07/31/2015 1024   CO2 23 10/28/2015 1113   CO2 21 07/31/2015 1024   GLUCOSE 107 10/28/2015 1113   GLUCOSE 119* 07/31/2015 1024   BUN 13.0 10/28/2015 1113   BUN 20 07/31/2015 1024   BUN 11 04/11/2014   CREATININE 1.0 10/28/2015 1113   CREATININE 1.07 07/31/2015 1024   CREATININE 0.83 05/27/2015 1512   CREATININE 1.0 04/11/2014   CALCIUM 10.0 10/28/2015 1113   CALCIUM 9.7 07/31/2015 1024   PROT 6.9 10/28/2015 1113   PROT 7.3 05/27/2015 1512   ALBUMIN 3.8 10/28/2015 1113   ALBUMIN  4.1 05/27/2015 1512   AST 22 10/28/2015 1113   AST 23 05/27/2015 1512   ALT 15 10/28/2015 1113   ALT 15 05/27/2015 1512   ALKPHOS 68 10/28/2015 1113   ALKPHOS 95 05/27/2015 1512   BILITOT 0.65 10/28/2015 1113   BILITOT 0.6 05/27/2015 1512   GFRNONAA 53* 12/10/2014 0530   GFRAA >60 12/10/2014 0530    No results found for: SPEP, UPEP  Lab Results  Component Value Date   WBC 5.7 10/28/2015   NEUTROABS 3.5 10/28/2015   HGB 9.6* 10/28/2015   HCT 29.2* 10/28/2015   MCV 103.5* 10/28/2015   PLT 105* 10/28/2015      Chemistry      Component Value Date/Time   NA 136 10/28/2015 1113   NA 136 07/31/2015 1024   NA 135* 04/11/2014   K 5.0 10/28/2015 1113   K 4.1 07/31/2015 1024   CL 103 07/31/2015 1024   CO2 23 10/28/2015 1113   CO2 21 07/31/2015 1024   BUN 13.0 10/28/2015 1113   BUN 20 07/31/2015 1024   BUN 11 04/11/2014   CREATININE 1.0 10/28/2015 1113   CREATININE 1.07 07/31/2015 1024   CREATININE 0.83 05/27/2015 1512  CREATININE 1.0 04/11/2014   GLU 158 04/11/2014      Component Value Date/Time   CALCIUM 10.0 10/28/2015 1113   CALCIUM 9.7 07/31/2015 1024   ALKPHOS 68 10/28/2015 1113   ALKPHOS 95 05/27/2015 1512   AST 22 10/28/2015 1113   AST 23 05/27/2015 1512   ALT 15 10/28/2015 1113   ALT 15 05/27/2015 1512   BILITOT 0.65 10/28/2015 1113   BILITOT 0.6 05/27/2015 1512      ASSESSMENT & PLAN:  CML (chronic myeloid leukemia) ([facility])  He has excellent response to treatment with normalization of his leukocytosis. We will continue same treatment. I will bring him back in 3 months with repeat blood work a week ahead of time to assess response to treatment  Acquired pancytopenia [facility]) This is likely due to recent treatment. The patient denies recent history of bleeding such as epistaxis, hematuria or hematochezia. He is asymptomatic from the anemia & thrombocytopenia. I will observe for now.  He does not require transfusion now. I will continue the chemotherapy at  current dose without dosage adjustment.  If the anemia gets progressive worse in the future, I might have to delay his treatment or adjust the chemotherapy dose.   Essential hypertension he will continue current medical management. I recommend close follow-up with primary care doctor for medication adjustment.   COPD mixed type [facility]) His symptoms are stable on medical management Continue conservative management.     Orders Placed This Encounter  Procedures  . BCR-ABL    With RT-PCR technique    Standing Status: Future     Number of Occurrences:      Standing Expiration Date: 12/01/2016   All questions were answered. The patient knows to call the clinic with any problems, questions or concerns. No barriers to learning was detected. I spent 15 minutes counseling the patient face to face. The total time spent in the appointment was 20 minutes and more than 50% was on counseling and review of test results     [facility], [place], MD 10/28/2015 12:54 PM

## 2015-10-30 ENCOUNTER — Encounter: Payer: Self-pay | Admitting: Internal Medicine

## 2015-10-30 ENCOUNTER — Other Ambulatory Visit: Payer: Self-pay

## 2015-10-30 DIAGNOSIS — I495 Sick sinus syndrome: Secondary | ICD-10-CM | POA: Diagnosis not present

## 2015-10-30 DIAGNOSIS — Z950 Presence of cardiac pacemaker: Secondary | ICD-10-CM | POA: Diagnosis not present

## 2015-10-30 NOTE — Patient Outreach (Signed)
[facility]) Care Management  10/30/2015  Eric [name] 08/18/1926 [id number]   Telephone call to patient for EMMI prevent screening.  Explained to patient purpose of [facility] Care Management and services provided. Patient states he currently has Scientist, product/process development that calls about every 4 weeks. Patient states he is not so sure he wants [place] Management after previously agreeing. Offered patient to send out more information explaining [place].  He is agreeable and agrees to call back if he really wants services.    Plan: RN Health Coach will send patient letter and brochure. RN Health Coach will proceed with case closure if no response after ten business days.   Jone Baseman, RN, MSN [place] [phone]

## 2015-11-05 ENCOUNTER — Telehealth: Payer: Self-pay | Admitting: Internal Medicine

## 2015-11-05 DIAGNOSIS — G4733 Obstructive sleep apnea (adult) (pediatric): Secondary | ICD-10-CM

## 2015-11-05 NOTE — Assessment & Plan Note (Signed)
He has been compliant with CPAP plus oxygen. Pressure has been comfortable at he has no specific complaints. He definitely feels better using CPAP and intends to continue.

## 2015-11-05 NOTE — Assessment & Plan Note (Addendum)
No recent acute exacerbation. Chronic exertional dyspnea without much recent cough. He paces himself on room air during the day and wants to stop paying for portable oxygen. But continues to sleep with it at night. Medications reviewed.

## 2015-11-05 NOTE — Telephone Encounter (Signed)
Spoke with patient-he would like to change from [facility] to [facility]; pt aware that I will place order. Melissa with [facility] is aware of order and aware that CY will sign order and complete OV note from 09-10-15 tonight. Nothing more needed at this time.

## 2015-11-13 ENCOUNTER — Telehealth: Payer: Self-pay | Admitting: Pharmacist

## 2015-11-13 ENCOUNTER — Telehealth: Payer: Self-pay

## 2015-11-13 DIAGNOSIS — L301 Dyshidrosis [pompholyx]: Secondary | ICD-10-CM | POA: Diagnosis not present

## 2015-11-13 NOTE — Telephone Encounter (Signed)
I s/w pt from [facility] to inform him that we received a supply of Sprycel 70 mg tablets (#60 tabs) from [place]. He will come in around 1 pm on Thurs 11/14/15 and ask Mrs. Wilma to call pharmacy so we can meet him in our lobby for his Sprycel supply. Kennith Center, Pharm.D., CPP 11/13/2015@5 :01 PM [facility]

## 2015-11-13 NOTE — Telephone Encounter (Signed)
Spoke to [place] to give orders to evaluate patient for PT and OT. Also, faxed recent office notes to [phone].

## 2015-11-13 NOTE — Telephone Encounter (Signed)
Needs PT and OT orders to be evaluation. [place]

## 2015-11-14 DIAGNOSIS — G8929 Other chronic pain: Secondary | ICD-10-CM | POA: Diagnosis not present

## 2015-11-14 DIAGNOSIS — M549 Dorsalgia, unspecified: Secondary | ICD-10-CM | POA: Diagnosis not present

## 2015-11-15 ENCOUNTER — Other Ambulatory Visit: Payer: Self-pay

## 2015-11-15 DIAGNOSIS — G8929 Other chronic pain: Secondary | ICD-10-CM | POA: Diagnosis not present

## 2015-11-15 DIAGNOSIS — M549 Dorsalgia, unspecified: Secondary | ICD-10-CM | POA: Diagnosis not present

## 2015-11-15 NOTE — Patient Outreach (Signed)
[facility]) Care Management  11/15/2015  KYLAN SHONK September 20, 1926 [id number]   Patient has not call back after request for information to be sent to him to review.  Will proceed with case closure.    Plan: RN Health coach will send notification to care management assistant to close case.   RN Health Coach will send notification to physician.  Jone Baseman, RN, MSN [place] [phone]

## 2015-11-15 NOTE — Telephone Encounter (Signed)
Verbal Order PT 1Wk 1 2wk 3 1wk 3wk Skip one week 1wk 1 Patient has low bp 120/53 Patient states had that bp for years. No sx. Also wants a nurse to come do nurse management Pain level 8    Call [phone] Legrand Como

## 2015-11-15 NOTE — Telephone Encounter (Signed)
Left message on Eric [name]'s voice mail.

## 2015-11-16 DIAGNOSIS — M549 Dorsalgia, unspecified: Secondary | ICD-10-CM | POA: Diagnosis not present

## 2015-11-16 DIAGNOSIS — G8929 Other chronic pain: Secondary | ICD-10-CM | POA: Diagnosis not present

## 2015-11-18 ENCOUNTER — Telehealth: Payer: Self-pay | Admitting: Pharmacist

## 2015-11-18 ENCOUNTER — Encounter: Payer: Self-pay | Admitting: Pharmacist

## 2015-11-18 NOTE — Progress Notes (Signed)
Pt came to [facility] and picked up his Sprycel today. Kennith Center, Pharm.D., CPP 11/18/2015@3 :[facility]

## 2015-11-18 NOTE — Progress Notes (Signed)
Addendum: Sprycel 70 mg (lot# [id number]; exp 02/05/18) x 1 bottle Kennith Center, Pharm.D., CPP 11/18/2015@3 :38 PM

## 2015-11-18 NOTE — Telephone Encounter (Signed)
Pt left 2 messages on Th (6/8 & 6/9) re: picking up Sprycel.  No pharmacist was staffing in [facility] those days. He has been "down in his back" and couldn't come to pick up his Sprycel supply and he forgot what he needed to do to obtain them at our office. Pt will come today at 11:30 am and speak w/ Mrs. Darryll Capers up front and we will make sure he gets the Sprycel today. Kennith Center, Pharm.D., CPP 11/18/2015@9 :04 AM Oral [place]

## 2015-11-19 DIAGNOSIS — M549 Dorsalgia, unspecified: Secondary | ICD-10-CM | POA: Diagnosis not present

## 2015-11-19 DIAGNOSIS — G8929 Other chronic pain: Secondary | ICD-10-CM | POA: Diagnosis not present

## 2015-11-21 DIAGNOSIS — G8929 Other chronic pain: Secondary | ICD-10-CM | POA: Diagnosis not present

## 2015-11-21 DIAGNOSIS — M549 Dorsalgia, unspecified: Secondary | ICD-10-CM | POA: Diagnosis not present

## 2015-11-25 ENCOUNTER — Telehealth: Payer: Self-pay | Admitting: Internal Medicine

## 2015-11-25 MED ORDER — ALBUTEROL SULFATE (2.5 MG/3ML) 0.083% IN NEBU: INHALATION_SOLUTION | RESPIRATORY_TRACT | Status: DC

## 2015-11-25 NOTE — Telephone Encounter (Signed)
Spoke with pt. He needs a refill on Albuterol neb solution. This has been sent in. While on the phone, pt wanted to make an appointment. He states that he is having increased SOB. I have scheduled him to see SG on 11/27/15 at 12pm. Advised him that if his breathing gets worse before this appointment he needs to call us back. He verbalized understanding. Nothing further was needed.

## 2015-11-26 DIAGNOSIS — M549 Dorsalgia, unspecified: Secondary | ICD-10-CM | POA: Diagnosis not present

## 2015-11-26 DIAGNOSIS — G8929 Other chronic pain: Secondary | ICD-10-CM | POA: Diagnosis not present

## 2015-11-27 ENCOUNTER — Encounter: Payer: Self-pay | Admitting: Acute Care

## 2015-11-27 ENCOUNTER — Ambulatory Visit (INDEPENDENT_AMBULATORY_CARE_PROVIDER_SITE_OTHER)
Admission: RE | Admit: 2015-11-27 | Discharge: 2015-11-27 | Disposition: A | Discharge status: Home / Self Care | Payer: Medicare Other | Source: Ambulatory Visit | Attending: Acute Care | Admitting: Acute Care

## 2015-11-27 ENCOUNTER — Telehealth: Payer: Self-pay | Admitting: Acute Care

## 2015-11-27 ENCOUNTER — Other Ambulatory Visit (INDEPENDENT_AMBULATORY_CARE_PROVIDER_SITE_OTHER): Payer: Medicare Other

## 2015-11-27 ENCOUNTER — Ambulatory Visit (INDEPENDENT_AMBULATORY_CARE_PROVIDER_SITE_OTHER): Payer: Medicare Other | Admitting: Acute Care

## 2015-11-27 VITALS — BP 140/62 | HR 61 | Temp 97.1°F | Ht 66.0 in | Wt 233.2 lb

## 2015-11-27 DIAGNOSIS — J449 Chronic obstructive pulmonary disease, unspecified: Secondary | ICD-10-CM

## 2015-11-27 DIAGNOSIS — R059 Cough, unspecified: Secondary | ICD-10-CM

## 2015-11-27 DIAGNOSIS — I110 Hypertensive heart disease with heart failure: Secondary | ICD-10-CM | POA: Diagnosis not present

## 2015-11-27 DIAGNOSIS — I6523 Occlusion and stenosis of bilateral carotid arteries: Secondary | ICD-10-CM | POA: Diagnosis not present

## 2015-11-27 DIAGNOSIS — R05 Cough: Secondary | ICD-10-CM

## 2015-11-27 DIAGNOSIS — D638 Anemia in other chronic diseases classified elsewhere: Secondary | ICD-10-CM

## 2015-11-27 LAB — CBC WITH DIFFERENTIAL/PLATELET
BASOS ABS: 0 10*3/uL (ref 0.0–0.1)
Basophils Relative: 0.2 % (ref 0.0–3.0)
Eosinophils Absolute: 0.2 10*3/uL (ref 0.0–0.7)
Eosinophils Relative: 4 % (ref 0.0–5.0)
HCT: 26.6 % — ABNORMAL LOW (ref 39.0–52.0)
Hemoglobin: 9 g/dL — ABNORMAL LOW (ref 13.0–17.0)
LYMPHS ABS: 1.1 10*3/uL (ref 0.7–4.0)
Lymphocytes Relative: 17.8 % (ref 12.0–46.0)
MCHC: 33.8 g/dL (ref 30.0–36.0)
MCV: 101.1 fl — AB (ref 78.0–100.0)
MONO ABS: 1 10*3/uL (ref 0.1–1.0)
MONOS PCT: 16.5 % — AB (ref 3.0–12.0)
NEUTROS ABS: 3.7 10*3/uL (ref 1.4–7.7)
NEUTROS PCT: 61.5 % (ref 43.0–77.0)
PLATELETS: 147 10*3/uL — AB (ref 150.0–400.0)
RBC: 2.63 Mil/uL — ABNORMAL LOW (ref 4.22–5.81)
RDW: 14 % (ref 11.5–15.5)
WBC: 6 10*3/uL (ref 4.0–10.5)

## 2015-11-27 LAB — BASIC METABOLIC PANEL
BUN: 14 mg/dL (ref 6–23)
CALCIUM: 10 mg/dL (ref 8.4–10.5)
CO2: 25 meq/L (ref 19–32)
CREATININE: 0.94 mg/dL (ref 0.40–1.50)
Chloride: 108 mEq/L (ref 96–112)
GFR: 80.75 mL/min (ref >60.00)
GLUCOSE: 118 mg/dL — AB (ref 70–99)
Potassium: 4.1 mEq/L (ref 3.5–5.1)
SODIUM: 137 meq/L (ref 135–145)

## 2015-11-27 LAB — BRAIN NATRIURETIC PEPTIDE: PRO B NATRI PEPTIDE: 296 pg/mL — AB (ref 0.0–100.0)

## 2015-11-27 NOTE — Patient Instructions (Addendum)
We will get a chest x ray today. CXR showed some congestive heart failure today. We will make a referral for follow up with cardiology. You are scheduled with Margarite Gouge Monday 12/02/15 at 9:15 am. We will get some lab work today. We will call you with results. We will walk you today in the office to see if your qualify for oxygen. We will give you a sample of BREO. Use 1 puff daily every day. Rinse your mouth after use with water. This is a maintenance medication for COPD.  Let us know if this helps with your shortness of breath when you follow up with [place] in 2 weeks.. Continue using your rescue inhaler as needed for shortness of breath or wheezing. Continue using your nebulizer treatments as needed for shortness of breath or wheezing up to every 4-6 hours Use Mucinex 600 mg twice daily for chest congestion. Add claritin 10 mg daily. Add Delsym cough syrup 10 cc's every 12 hours for cough. Follow up in 2 weeks with Dr. Annamaria Boots.. Please contact office for sooner follow up if symptoms do not improve or worsen or seek emergency care  Dr. [name] is a pulmonologist who works at the [facility] in [place].

## 2015-11-27 NOTE — Progress Notes (Signed)
History of Present Illness Eric [name] is a 82 y.o. male former smoker and Veteran with asthma, COPD, and OSA ( on CPAP) complicated by DM, Obesity,CAD/ischemia with a pacemaker followed by Dr. Annamaria Boots.   6/21/2017Acute OV: Pt. Presents to the office for an acute office visit . He is here complaining of a cough that he has had for about 5 months.He has been to the [facility] and was given medications for this cough , but he cannot tell me what they are. He states that he had pneumonia 6 months ago, which has resolved. He states he has not breathed well in about 6 months.He has started using his nebulizer treatments just yesterday, and these do help.Marland KitchenHe is coughing up white foamy secretions.He has not had fever, chest pain, orthopnea, hemoptysis . He is compliant with his allergy medication and his Flonase daily.He states his respiratory problems started worsening when he started taking Sprycel ordered by  his physician at the [facility] for his bone marrow cancer.He is not on a maintenance inhaler at present.He states they are too expensive under his VA benefit.He does have a considerable cardiac history and is seen by [facility], specifically Dr. Caryl Comes and Tamala Julian.He is also seen by Dr. Heath Lark in  oncology for his CML.  Tests: CXR: 11/27/2015 ( Reviewed personally by me and Dr. Christinia Gully)  IMPRESSION: Cardiac pacer in stable position. Prior CABG. Congestive heart failure with pulmonary interstitial edema  CBC Latest Ref Rng 11/27/2015 10/28/2015 09/18/2015  WBC 4.0 - 10.5 K/uL 6.0 5.7 3.5(L)  Hemoglobin 13.0 - 17.0 g/dL 9.0 Repeated and verified X2.(L) 9.6(L) 9.6(L)  Hematocrit 39.0 - 52.0 % 26.6 Repeated and verified X2.(L) 29.2(L) 27.4(L)  Platelets 150.0 - 400.0 K/uL 147.0(L) 105(L) 216.0     Past medical hx Past Medical History  Diagnosis Date  . LACTOSE INTOLERANCE   . OBESITY   . CARDIOMYOPATHY, ISCHEMIC   . AORTIC SCLEROSIS   . SICK SINUS/ TACHY-BRADY SYNDROME 09/2007    s/p PPM st judes   . PERIPHERAL VASCULAR DISEASE   . CAROTID BRUIT, RIGHT 02/27/2008  . IBS (irritable bowel syndrome)   . ALLERGIC RHINITIS   . ANEMIA-NOS   . OA (osteoarthritis)   . COPD   . GERD   . HYPERLIPIDEMIA   . HIATAL HERNIA   . Diverticulosis   . Prostate cancer ([place])     seed implants 2004  . DIABETES MELLITUS-TYPE II     diet controlled  . CORONARY ARTERY DISEASE     CABG 1995, PTCA/DES 2008, 2009 and 08/2010  . HYPERTENSION   . Asthma   . Sleep apnea   . Partial small bowel obstruction ([place])   . Primary hyperparathyroidism ([place])     Lab Results Component Value Date  PTH 150.7* 02/13/2013  CALCIUM 11.0* 02/13/2013  CAION 1.21 03/15/2008    . SMALL BOWEL OBSTRUCTION 04/18/2009    Qualifier: History of  By: Asa Lente MD, Jannifer Rodney Cataract     surgery  . Symptomatic diverticulosis 01/18/2009    Qualifier: Diagnosis of  By: Trellis Paganini PA-c, Amy S   . Hx of echocardiogram     Echo (9/15):  Mild LVH, EF 50-55%, no RWMA, Gr 1 DD, MAC, mild LAE.  . Diastolic dysfunction, Grade 1 11/24/2014  . Diverticulitis of colon with perforation 11/22/2014  . Hyponatremia 11/22/2014  . CML (chronic myeloid leukemia) ([place]) 06/26/2015     Past surgical hx, Family hx, Social hx all reviewed.  Current  Outpatient Prescriptions on File Prior to Visit  Medication Sig  . albuterol (PROVENTIL HFA) 108 (90 BASE) MCG/ACT inhaler Inhale 2 puffs into the lungs every 4 (four) hours as needed for shortness of breath. Wheezing   . albuterol (PROVENTIL) (2.5 MG/3ML) 0.083% nebulizer solution USE 1 VIAL WITH NEBULIZER EVERY 4 HOURS AS NEEDED FOR WHEEZING  . aspirin 81 MG tablet Take 81 mg by mouth daily.  . dasatinib (SPRYCEL) 70 MG tablet Take 1 tablet (70 mg total) by mouth daily.  . fluticasone (FLONASE) 50 MCG/ACT nasal spray Place 2 sprays into both nostrils daily. Allergies  . glucose blood (ONE TOUCH TEST STRIPS) test strip Use to check blood sugars once a day Dx E11.9  . hydrochlorothiazide (MICROZIDE) 12.5 MG  capsule Take 1 capsule (12.5 mg total) by mouth daily.  . indomethacin (INDOCIN) 25 MG capsule Take 1 capsule (25 mg total) by mouth 3 (three) times daily as needed.  . isosorbide mononitrate (IMDUR) 30 MG 24 hr tablet Take 1 tablet (30 mg total) by mouth every evening.  . nitroGLYCERIN (NITROSTAT) 0.4 MG SL tablet Place 0.4 mg under the tongue every 5 (five) minutes as needed for chest pain. Chest pain   . Polyethyl Glycol-Propyl Glycol (SYSTANE) 0.4-0.3 % SOLN Apply 2 drops to eye 4 (four) times daily.   . pravastatin (PRAVACHOL) 20 MG tablet Take 3 tablets (60 mg total) by mouth daily.  . ranitidine (ZANTAC) 300 MG tablet Take 300 mg by mouth daily.   [name] triamcinolone cream (KENALOG) 0.1 % Apply 1 application topically 3 (three) times daily as needed (dry skin.).   [name] valsartan (DIOVAN) 160 MG tablet Take 1 tablet (160 mg total) by mouth daily.   No current facility-administered medications on file prior to visit.     Allergies  Allergen Reactions  . Actos [Pioglitazone Hydrochloride] Other (See Comments)    "felt funny, drowsy, and weak":  . Celebrex [Celecoxib] Other (See Comments)    "felt funny"  . Demerol Palpitations and Other (See Comments)    Increased BP  . Morphine And Related Nausea And Vomiting  . Ciprofloxacin Other (See Comments)    arthralgia  . Metformin Nausea And Vomiting  . Zocor [Simvastatin] Other (See Comments)    Makes pt very drowsy    Review Of Systems:  Constitutional:   No  weight loss, night sweats,  Fevers, chills,+  fatigue, or  lassitude.  HEENT:   No headaches,  Difficulty swallowing,  Tooth/dental problems, or  Sore throat,                No sneezing, itching, ear ache, +nasal congestion,+ post nasal drip,   CV:  No chest pain,  Orthopnea, PND, + swelling in lower extremities, no anasarca, dizziness, palpitations, syncope.   GI  No heartburn, indigestion, abdominal pain, nausea, vomiting, diarrhea, change in bowel habits, loss of appetite,  bloody stools.   Resp: + shortness of breath with exertion less at rest.  + excess mucus, + productive cough,  No non-productive cough,  No coughing up of blood.  No change in color of mucus.  + wheezing.  No chest wall deformity  Skin: no rash or lesions.  GU: no dysuria, change in color of urine, no urgency or frequency.  No flank pain, no hematuria   MS:  No joint pain or swelling.  No decreased range of motion.  No back pain.  Psych:  No change in mood or affect. No depression or anxiety.  No  memory loss.   Vital Signs BP 140/62 mmHg  Pulse 61  Temp(Src) 97.1 F (36.2 C) (Oral)  Ht 5\' 6"  (1.676 m)  Wt 233 lb 3.2 oz (105.779 kg)  BMI 37.66 kg/m2  SpO2 97%   Physical Exam:  General- No distress,  A&Ox3, pleasant, HOH ENT: No sinus tenderness, TM clear, pale nasal mucosa, no oral exudate,+ post nasal drip, no LAN Cardiac: S1, S2, regular rate and rhythm, no murmur Chest: No wheeze/ +rales/ no dullness; no accessory muscle use, no nasal flaring, no sternal retractions Abd.: Soft Non-tender Ext: No clubbing cyanosis, 1+edema lower extremities Neuro:  normal strength Skin: No rashes, warm and dry Psych: normal mood and behavior   Assessment/Plan  COPD mixed type (HCC) Mild exacerbation Dyspnea with exertion Cough with white frothy secretions. Denies fever. Plan:  We will get a chest x ray today. CXR showed some congestive heart failure today. We will make a referral for follow up with cardiology. You are scheduled with Margarite Gouge Monday 12/02/15 at 9:15 am. We will get some lab work today. We will call you with results. We will walk you today in the office to see if your qualify for oxygen. We will give you a sample of BREO. Use 1 puff daily every day. Rinse your mouth after use with water. This is a maintenance medication for COPD.  Let us know if this helps with your shortness of breath when you follow up with [place] in 2 weeks.. Continue using your rescue  inhaler as needed for shortness of breath or wheezing. Continue using your nebulizer treatments as needed for shortness of breath or wheezing up to every 4-6 hours Use Mucinex 600 mg twice daily for chest congestion. Add claritin 10 mg daily. Add Delsym cough syrup 10 cc's every 12 hours for cough. Follow up in 2 weeks with Dr. Annamaria Boots.. Please contact office for sooner follow up if symptoms do not improve or worsen or seek emergency care  Dr. [name] is a pulmonologist who works at the [facility] [place] in [place].   Anemia in chronic illness Progressing anemia on Sprycel HGB 9.0/ HCT 26.6 on 11/27/2015 No signs of bleeding per patient Plan: Will message oncology so they can consider dose adjustment/ delay of treatment     Magdalen Spatz, NP 11/27/2015  4:04 PM

## 2015-11-27 NOTE — Assessment & Plan Note (Addendum)
Mild exacerbation Dyspnea with exertion Cough with white frothy secretions. Denies fever. Plan:  We will get a chest x ray today. CXR showed some congestive heart failure today. We will make a referral for follow up with cardiology. You are scheduled with Margarite Gouge Monday 12/02/15 at 9:15 am. We will get some lab work today. We will call you with results. We will walk you today in the office to see if your qualify for oxygen. We will give you a sample of BREO. Use 1 puff daily every day. Rinse your mouth after use with water. This is a maintenance medication for COPD.  Let us know if this helps with your shortness of breath when you follow up with [place] in 2 weeks.. Continue using your rescue inhaler as needed for shortness of breath or wheezing. Continue using your nebulizer treatments as needed for shortness of breath or wheezing up to every 4-6 hours Use Mucinex 600 mg twice daily for chest congestion. Add claritin 10 mg daily. Add Delsym cough syrup 10 cc's every 12 hours for cough. Follow up in 2 weeks with Dr. Annamaria Boots.. Please contact office for sooner follow up if symptoms do not improve or worsen or seek emergency care  Dr. [name] is a pulmonologist who works at the [place] in [place].

## 2015-11-27 NOTE — Telephone Encounter (Signed)
Message sent to Dr. Heath Lark 11/27/2015  I saw Eric [name] today in the pulmonary office. Please note his HGB is 9.0 and his HCT is 26.6. This is a progressive anemia. I saw in your previous note that if his HGB continued to drop you were considering adjusting his chemo dose.He states he has no signs of bleeding. I just wanted to make sure you were aware. Complete CBC with Diff from today's appointment is in EPIC. Thank you, Eric Form, NP

## 2015-11-27 NOTE — Assessment & Plan Note (Addendum)
Progressing anemia on Sprycel HGB 9.0/ HCT 26.6 on 11/27/2015 No signs of bleeding per patient Plan: Will message oncology so they can consider dose adjustment/ delay of treatment

## 2015-11-28 ENCOUNTER — Telehealth: Payer: Self-pay | Admitting: Acute Care

## 2015-11-28 ENCOUNTER — Telehealth: Payer: Self-pay | Admitting: *Deleted

## 2015-11-28 ENCOUNTER — Telehealth: Payer: Self-pay | Admitting: Hematology and Oncology

## 2015-11-28 DIAGNOSIS — G8929 Other chronic pain: Secondary | ICD-10-CM | POA: Diagnosis not present

## 2015-11-28 DIAGNOSIS — M549 Dorsalgia, unspecified: Secondary | ICD-10-CM | POA: Diagnosis not present

## 2015-11-28 NOTE — Telephone Encounter (Signed)
Informed pt of new instructions from Dr. Alvy Bimler regarding his Sprycel.   She received his labwork from Pulmonary MD and he is getting more anemic so she wants to decrease his dose of Sprycel.  Pt states he has "2 or 3 bottles" of Sprycel 70 mg left at home.  Instructed pt to cut pills in 1/2 and take 1/2 tablet daily or he can take one whole pill every other day.  Pt states he thinks it will be too difficult to cut in half and will take one pill every other day.   Informed him Dr. Alvy Bimler needs to see him with lab in 2 weeks on either 7/6 or 7/7.  Pt says 7/6 works best for him.  Informed pt he will get call from scheduler with the time for 7/6.  He verbalized understanding.   POF sent for lab/MD on 7/6.

## 2015-11-28 NOTE — Telephone Encounter (Signed)
Attempted to contact patient, voicemail not set up, unable to leave message, will call back.

## 2015-11-28 NOTE — Telephone Encounter (Signed)
s.w. pt and advised on July appt....pt ok and aware °

## 2015-11-28 NOTE — Telephone Encounter (Signed)
Patient states that he has an appointment with Dr. Anson Fret at [facility] in [place] on 12/18/15 and they need records from our office faxed to them. Records faxed. Patient aware. Nothing further needed.

## 2015-11-28 NOTE — Telephone Encounter (Signed)
Pt returning call and can be reached @ [phone].Eric [name]

## 2015-12-01 ENCOUNTER — Encounter (HOSPITAL_COMMUNITY): Payer: Self-pay

## 2015-12-01 ENCOUNTER — Other Ambulatory Visit: Payer: Self-pay

## 2015-12-01 ENCOUNTER — Emergency Department (HOSPITAL_COMMUNITY): Payer: Medicare Other

## 2015-12-01 ENCOUNTER — Emergency Department (HOSPITAL_COMMUNITY)
Admission: EM | Admit: 2015-12-01 | Discharge: 2015-12-02 | Disposition: A | Discharge status: Home / Self Care | Payer: Medicare Other | Attending: Emergency Medicine | Admitting: Emergency Medicine

## 2015-12-01 DIAGNOSIS — R079 Chest pain, unspecified: Secondary | ICD-10-CM | POA: Diagnosis not present

## 2015-12-01 DIAGNOSIS — R0602 Shortness of breath: Secondary | ICD-10-CM | POA: Diagnosis not present

## 2015-12-01 DIAGNOSIS — Z8546 Personal history of malignant neoplasm of prostate: Secondary | ICD-10-CM | POA: Diagnosis not present

## 2015-12-01 DIAGNOSIS — R600 Localized edema: Secondary | ICD-10-CM | POA: Insufficient documentation

## 2015-12-01 DIAGNOSIS — I10 Essential (primary) hypertension: Secondary | ICD-10-CM | POA: Diagnosis not present

## 2015-12-01 DIAGNOSIS — Z856 Personal history of leukemia: Secondary | ICD-10-CM | POA: Insufficient documentation

## 2015-12-01 DIAGNOSIS — Z951 Presence of aortocoronary bypass graft: Secondary | ICD-10-CM | POA: Insufficient documentation

## 2015-12-01 DIAGNOSIS — R05 Cough: Secondary | ICD-10-CM | POA: Diagnosis not present

## 2015-12-01 DIAGNOSIS — J189 Pneumonia, unspecified organism: Secondary | ICD-10-CM | POA: Insufficient documentation

## 2015-12-01 DIAGNOSIS — Z950 Presence of cardiac pacemaker: Secondary | ICD-10-CM | POA: Insufficient documentation

## 2015-12-01 DIAGNOSIS — Z7982 Long term (current) use of aspirin: Secondary | ICD-10-CM | POA: Diagnosis not present

## 2015-12-01 DIAGNOSIS — Z87891 Personal history of nicotine dependence: Secondary | ICD-10-CM | POA: Insufficient documentation

## 2015-12-01 DIAGNOSIS — Z96641 Presence of right artificial hip joint: Secondary | ICD-10-CM | POA: Diagnosis not present

## 2015-12-01 DIAGNOSIS — I2510 Atherosclerotic heart disease of native coronary artery without angina pectoris: Secondary | ICD-10-CM | POA: Insufficient documentation

## 2015-12-01 DIAGNOSIS — E119 Type 2 diabetes mellitus without complications: Secondary | ICD-10-CM | POA: Insufficient documentation

## 2015-12-01 DIAGNOSIS — J449 Chronic obstructive pulmonary disease, unspecified: Secondary | ICD-10-CM | POA: Insufficient documentation

## 2015-12-01 LAB — BASIC METABOLIC PANEL
Anion gap: 8 (ref 5–15)
BUN: 18 mg/dL (ref 6–20)
CO2: 22 mmol/L (ref 22–32)
Calcium: 9.7 mg/dL (ref 8.9–10.3)
Chloride: 104 mmol/L (ref 101–111)
Creatinine, Ser: 1.21 mg/dL (ref 0.61–1.24)
GFR calc Af Amer: >60 mL/min (ref >60)
GFR calc non Af Amer: 52 mL/min — ABNORMAL LOW (ref >60)
Glucose, Bld: 142 mg/dL — ABNORMAL HIGH (ref 65–99)
Potassium: 3.8 mmol/L (ref 3.5–5.1)
Sodium: 134 mmol/L — ABNORMAL LOW (ref 135–145)

## 2015-12-01 LAB — CBC WITH DIFFERENTIAL/PLATELET
Basophils Absolute: 0 10*3/uL (ref 0.0–0.1)
Basophils Relative: 0 %
Eosinophils Absolute: 0.2 10*3/uL (ref 0.0–0.7)
Eosinophils Relative: 2 %
HCT: 28.5 % — ABNORMAL LOW (ref 39.0–52.0)
Hemoglobin: 9 g/dL — ABNORMAL LOW (ref 13.0–17.0)
Lymphocytes Relative: 6 %
Lymphs Abs: 0.4 10*3/uL — ABNORMAL LOW (ref 0.7–4.0)
MCH: 32.3 pg (ref 26.0–34.0)
MCHC: 31.6 g/dL (ref 30.0–36.0)
MCV: 102.2 fL — ABNORMAL HIGH (ref 78.0–100.0)
Monocytes Absolute: 0.8 10*3/uL (ref 0.1–1.0)
Monocytes Relative: 11 %
Neutro Abs: 6.1 10*3/uL (ref 1.7–7.7)
Neutrophils Relative %: 81 %
Platelets: 179 10*3/uL (ref 150–400)
RBC: 2.79 MIL/uL — ABNORMAL LOW (ref 4.22–5.81)
RDW: 13.1 % (ref 11.5–15.5)
WBC: 7.5 10*3/uL (ref 4.0–10.5)

## 2015-12-01 LAB — TROPONIN I: Troponin I: <0.03 ng/mL (ref <0.031)

## 2015-12-01 LAB — BRAIN NATRIURETIC PEPTIDE: B Natriuretic Peptide: 538.5 pg/mL — ABNORMAL HIGH (ref 0.0–100.0)

## 2015-12-01 MED ORDER — DEXTROSE 5 % IV SOLN
500.0000 mg | Freq: Once | INTRAVENOUS | Status: AC
  Administered 2015-12-01: 500 mg via INTRAVENOUS
  Filled 2015-12-01: qty 500

## 2015-12-01 MED ORDER — FUROSEMIDE 10 MG/ML IJ SOLN
30.0000 mg | Freq: Once | INTRAMUSCULAR | Status: AC
  Administered 2015-12-02: 30 mg via INTRAVENOUS
  Filled 2015-12-01: qty 4

## 2015-12-01 MED ORDER — DEXTROSE 5 % IV SOLN
1.0000 g | Freq: Once | INTRAVENOUS | Status: AC
  Administered 2015-12-01: 1 g via INTRAVENOUS
  Filled 2015-12-01: qty 10

## 2015-12-01 MED ORDER — HYDROCODONE-ACETAMINOPHEN 5-325 MG PO TABS
1.0000 | ORAL_TABLET | Freq: Once | ORAL | Status: AC
  Administered 2015-12-01: 1 via ORAL
  Filled 2015-12-01: qty 1

## 2015-12-01 NOTE — ED Provider Notes (Signed)
CSN: [id number]     Arrival date & time 12/01/15  2026 History   First MD Initiated Contact with Patient 12/01/15 2029     Chief Complaint  Patient presents with  . Cough  . Chest Pain     (Consider location/radiation/quality/duration/timing/severity/associated sxs/prior Treatment) HPI   82yM from [facility]. Pt presents with substernal burning chest pain and cough. Pt states cough x 6 months. Pt with productive, ronchous cough. Has worsened within the past week. Chest burning and sometimes sharper pain when he coughs. Subjective fever. Denies orthopnea. No change in swelling.    Past Medical History  Diagnosis Date  . LACTOSE INTOLERANCE   . OBESITY   . CARDIOMYOPATHY, ISCHEMIC   . AORTIC SCLEROSIS   . SICK SINUS/ TACHY-BRADY SYNDROME 09/2007    s/p PPM st judes  . PERIPHERAL VASCULAR DISEASE   . CAROTID BRUIT, RIGHT 02/27/2008  . IBS (irritable bowel syndrome)   . ALLERGIC RHINITIS   . ANEMIA-NOS   . OA (osteoarthritis)   . COPD   . GERD   . HYPERLIPIDEMIA   . HIATAL HERNIA   . Diverticulosis   . Prostate cancer ([place])     seed implants 2004  . DIABETES MELLITUS-TYPE II     diet controlled  . CORONARY ARTERY DISEASE     CABG 1995, PTCA/DES 2008, 2009 and 08/2010  . HYPERTENSION   . Asthma   . Sleep apnea   . Partial small bowel obstruction ([place])   . Primary hyperparathyroidism ([place])     Lab Results Component Value Date  PTH 150.7* 02/13/2013  CALCIUM 11.0* 02/13/2013  CAION 1.21 03/15/2008    . SMALL BOWEL OBSTRUCTION 04/18/2009    Qualifier: History of  By: Asa Lente MD, Jannifer Rodney Cataract     surgery  . Symptomatic diverticulosis 01/18/2009    Qualifier: Diagnosis of  By: Trellis Paganini PA-c, Amy S   . Hx of echocardiogram     Echo (9/15):  Mild LVH, EF 50-55%, no RWMA, Gr 1 DD, MAC, mild LAE.  . Diastolic dysfunction, Grade 1 11/24/2014  . Diverticulitis of colon with perforation 11/22/2014  . Hyponatremia 11/22/2014  . CML (chronic myeloid leukemia)  ([place]) 06/26/2015   Past Surgical History  Procedure Laterality Date  . Ptca  2008, 2009, 2012    with DES  . Partial small bowel obstruction  2009  . Bilateral cataracts    . Pacemaker insertion      DDD/[employer]         Last interrogation 2/13  on chart     Pacemaker guideline order Dr Tamala Julian on chart  . Inguinal hernia repair Bilateral   . Lumbar disc surgery  12/2008  . Coronary artery bypass graft    . Total hip arthroplasty  08/21/2011    Procedure: TOTAL HIP ARTHROPLASTY;  Surgeon: Johnn Hai, MD;  Location: [facility];  Service: Orthopedics;  Laterality: Right;  . Colonoscopy    . Esophagogastroduodenoscopy  multiple  . Penile prosthesis placement    . Back surgery    . Colon surgery    . Flexible sigmoidoscopy N/A 09/22/2013    Procedure: FLEXIBLE SIGMOIDOSCOPY;  Surgeon: Gatha Mayer, MD;  Location: [facility];  Service: Endoscopy;  Laterality: N/A;  . Colon resection N/A 11/28/2014    Procedure: EXPLORATORY LAPAROTOMY, SIGMOID COLECTOMY WITH COLOSTOMY;  Surgeon: Jackolyn Confer, MD;  Location: [facility];  Service: General;  Laterality: N/A;   Family History  Problem Relation  Age of Onset  . Colon cancer Neg Hx   . Hypertension Mother   . Heart attack    . Heart attack    . Stroke Neg Hx   . Heart attack Brother   . Cancer Mother    Social History  Substance Use Topics  . Smoking status: Former Smoker -- 1.00 packs/day for 25 years    Types: Cigarettes    Quit date: 06/08/1994  . Smokeless tobacco: Never Used  . Alcohol Use: No    Review of Systems  All systems reviewed and negative, other than as noted in HPI.   Allergies  Actos; Celebrex; Demerol; Morphine and related; Ciprofloxacin; Metformin; and Zocor  Home Medications   Prior to Admission medications   Medication Sig Start Date End Date Taking? Authorizing Provider  acetaminophen (TYLENOL) 500 MG tablet Take 1,000 mg by mouth every 6 (six) hours as needed for mild pain or fever.   Yes  Historical Provider, MD  albuterol (PROVENTIL HFA) 108 (90 BASE) MCG/ACT inhaler Inhale 2 puffs into the lungs every 4 (four) hours as needed for shortness of breath. Wheezing    Yes Historical Provider, MD  albuterol (PROVENTIL) (2.5 MG/3ML) 0.083% nebulizer solution USE 1 VIAL WITH NEBULIZER EVERY 4 HOURS AS NEEDED FOR WHEEZING 11/25/15  Yes Deneise Lever, MD  aspirin 81 MG tablet Take 81 mg by mouth every evening.    Yes Historical Provider, MD  dasatinib (SPRYCEL) 70 MG tablet Take 1 tablet (70 mg total) by mouth daily. Patient taking differently: Take 70 mg by mouth every other day.  06/26/15  Yes Heath Lark, MD  fluticasone (FLONASE) 50 MCG/ACT nasal spray Place 2 sprays into both nostrils daily. Allergies 08/01/14  Yes [name], MD  glucose blood (ONE TOUCH TEST STRIPS) test strip Use to check blood sugars once a day Dx E11.9 08/26/15  Yes [name], MD  hydrochlorothiazide (MICROZIDE) 12.5 MG capsule Take 1 capsule (12.5 mg total) by mouth daily. 07/10/15  Yes Belva Crome, MD  HYDROcodone-acetaminophen (NORCO) 7.5-325 MG tablet Take 1 tablet by mouth every 6 (six) hours as needed for moderate pain.   Yes Historical Provider, MD  nitroGLYCERIN (NITROSTAT) 0.4 MG SL tablet Place 0.4 mg under the tongue every 5 (five) minutes as needed for chest pain. Chest pain   Yes Historical Provider, MD  Polyethyl Glycol-Propyl Glycol (SYSTANE) 0.4-0.3 % SOLN Apply 2 drops to eye 4 (four) times daily.    Yes Historical Provider, MD  pravastatin (PRAVACHOL) 20 MG tablet Take 3 tablets (60 mg total) by mouth daily. 08/01/14  Yes [name], MD  ranitidine (ZANTAC) 300 MG tablet Take 300 mg by mouth daily.    Yes Historical Provider, MD  triamcinolone cream (KENALOG) 0.1 % Apply 1 application topically 3 (three) times daily as needed (dry skin.).  07/19/15  Yes Historical Provider, MD  valsartan (DIOVAN) 160 MG tablet Take 1 tablet (160 mg total) by mouth daily. 08/01/14  Yes  [name], MD   BP 140/51 mmHg  Pulse 94  Temp(Src) 100.2 F (37.9 C) (Oral)  Resp 22  SpO2 96% Physical Exam  Constitutional: He appears well-developed and well-nourished. No distress.  HENT:  Head: Normocephalic and atraumatic.  Eyes: Conjunctivae are normal. Right eye exhibits no discharge. Left eye exhibits no discharge.  Neck: Neck supple.  Cardiovascular: Normal rate, regular rhythm and normal heart sounds.  Exam reveals no gallop and no friction rub.   No murmur heard.  Pulmonary/Chest: Effort normal and breath sounds normal. No respiratory distress.  Abdominal: Soft. He exhibits no distension. There is no tenderness.  Musculoskeletal: He exhibits edema. He exhibits no tenderness.  Lower extremities symmetric as compared to each other. No calf tenderness. Negative Homan's. No palpable cords.   Neurological: He is alert.  Skin: Skin is warm and dry.  Psychiatric: He has a normal mood and affect. His behavior is normal. Thought content normal.  Nursing note and vitals reviewed.   ED Course  Procedures (including critical care time) Labs Review Labs Reviewed  CBC WITH DIFFERENTIAL/PLATELET - Abnormal; Notable for the following:    RBC 2.79 (*)    Hemoglobin 9.0 (*)    HCT 28.5 (*)    MCV 102.2 (*)    Lymphs Abs 0.4 (*)    All other components within normal limits  BASIC METABOLIC PANEL - Abnormal; Notable for the following:    Sodium 134 (*)    Glucose, Bld 142 (*)    GFR calc non Af Amer 52 (*)    All other components within normal limits  BRAIN NATRIURETIC PEPTIDE - Abnormal; Notable for the following:    B Natriuretic Peptide 538.5 (*)    All other components within normal limits  TROPONIN I    Imaging Review Dg Chest 2 View  12/01/2015  CLINICAL DATA:  82 year old male with chest pain and cough EXAM: CHEST  2 VIEW COMPARISON:  Radiograph dated 11/27/2015 FINDINGS: Two views of the chest demonstrate emphysematous changes of the lungs with bibasilar  atelectasis/ scarring. No focal consolidation, pleural effusion, or pneumothorax. Stable cardiac silhouette. Median sternotomy wires CABG vascular clips and left pectoral pacemaker device. No acute osseous pathology. IMPRESSION: No active cardiopulmonary disease. Electronically Signed   By: Anner Crete M.D.   On: 12/01/2015 22:09   I have personally reviewed and evaluated these images and lab results as part of my medical decision-making.   EKG Interpretation None      MDM   Final diagnoses:  CAP (community acquired pneumonia)    88yM with cough and CP. Doubt ACS. PE considered, but doubt this as well. May be some component of HF. Also some concern for possible infectious process although imaging fairly unremarkable. Temp 100.2. He reports productive cough. Will cover for possible pneumonia although not completely convinced.     Virgel Manifold, MD 12/17/15 [phone]

## 2015-12-01 NOTE — ED Notes (Signed)
Per EMS: Pt from [facility]. Pt presents with substernal burning chest pain and cough. Pt states cough x 6 months. Pt with productive, ronchous cough. Pt on 3L O2, sats 90% on RA.

## 2015-12-02 ENCOUNTER — Ambulatory Visit: Payer: Medicare Other | Admitting: Internal Medicine

## 2015-12-02 ENCOUNTER — Encounter: Payer: Medicare Other | Admitting: Physician Assistant

## 2015-12-02 DIAGNOSIS — R079 Chest pain, unspecified: Secondary | ICD-10-CM | POA: Diagnosis not present

## 2015-12-02 DIAGNOSIS — J189 Pneumonia, unspecified organism: Secondary | ICD-10-CM | POA: Diagnosis not present

## 2015-12-02 MED ORDER — CEPHALEXIN 500 MG PO CAPS: 500.0000 mg | ORAL_CAPSULE | Freq: Three times a day (TID) | ORAL | Status: DC

## 2015-12-02 MED ORDER — HYDROCODONE-ACETAMINOPHEN 5-325 MG PO TABS
1.0000 | ORAL_TABLET | Freq: Once | ORAL | Status: AC
  Administered 2015-12-02: 1 via ORAL
  Filled 2015-12-02: qty 1

## 2015-12-02 MED ORDER — AZITHROMYCIN 250 MG PO TABS: 250.0000 mg | ORAL_TABLET | Freq: Every day | ORAL | Status: DC

## 2015-12-02 NOTE — Discharge Instructions (Signed)

## 2015-12-02 NOTE — Progress Notes (Signed)
Cardiology Office Note    Date:  12/02/2015   ID:  Eric [name], DOB [birth year], MRN [id number]  PCP:  [name], MD  Cardiologist:Dr. Daneen Schick EPS Dr. Caryl Comes   Chief complaint patient is a no show  History of Present Illness:  Eric [name] is a 82 y.o. male with history of CAD status post CABG in 1995, [place] in 2008, with stenting of the vein graft to the diagonal in 2009 and stenting of the SVG to the OM in 2012. LVEF 55% on echo in 2015. Pacemaker implanted in 2009 for sinus node dysfunction. Patient also has hypertension, hyperlipidemia. Last saw Dr. Caryl Comes 08/26/15 he had one intercurrent atrial fibrillation episode 01/2015 duration 20 minutes probably not sufficient to justify anticoagulation. Patient also has history of chronic diastolic heart failure, bilateral carotid artery stenosis asymptomatic.  Patient also has history of diverticulitis status post colectomy and colostomy, CML on Sprycel which was recently decreased because of anemia.  She recently saw Eric Form, NP. He was complaining of a cough for 5 months. Chest x-ray 11/27/15  showed CHF. Patient was in the ER yesterday and chest x-ray did not show CHF.   Past Medical History  Diagnosis Date  . LACTOSE INTOLERANCE   . OBESITY   . CARDIOMYOPATHY, ISCHEMIC   . AORTIC SCLEROSIS   . SICK SINUS/ TACHY-BRADY SYNDROME 09/2007    s/p PPM st judes  . PERIPHERAL VASCULAR DISEASE   . CAROTID BRUIT, RIGHT 02/27/2008  . IBS (irritable bowel syndrome)   . ALLERGIC RHINITIS   . ANEMIA-NOS   . OA (osteoarthritis)   . COPD   . GERD   . HYPERLIPIDEMIA   . HIATAL HERNIA   . Diverticulosis   . Prostate cancer ([place])     seed implants 2004  . DIABETES MELLITUS-TYPE II     diet controlled  . CORONARY ARTERY DISEASE     CABG 1995, PTCA/DES 2008, 2009 and 08/2010  . HYPERTENSION   . Asthma   . Sleep apnea   . Partial small bowel obstruction ([place])   . Primary hyperparathyroidism ([place])     Lab Results  Component Value Date  PTH 150.7* 02/13/2013  CALCIUM 11.0* 02/13/2013  CAION 1.21 03/15/2008    . SMALL BOWEL OBSTRUCTION 04/18/2009    Qualifier: History of  By: Asa Lente MD, Jannifer Rodney Cataract     surgery  . Symptomatic diverticulosis 01/18/2009    Qualifier: Diagnosis of  By: Trellis Paganini PA-c, Amy S   . Hx of echocardiogram     Echo (9/15):  Mild LVH, EF 50-55%, no RWMA, Gr 1 DD, MAC, mild LAE.  . Diastolic dysfunction, Grade 1 11/24/2014  . Diverticulitis of colon with perforation 11/22/2014  . Hyponatremia 11/22/2014  . CML (chronic myeloid leukemia) ([place]) 06/26/2015    Past Surgical History  Procedure Laterality Date  . Ptca  2008, 2009, 2012    with DES  . Partial small bowel obstruction  2009  . Bilateral cataracts    . Pacemaker insertion      DDD/[employer]         Last interrogation 2/13  on chart     Pacemaker guideline order Dr Tamala Julian on chart  . Inguinal hernia repair Bilateral   . Lumbar disc surgery  12/2008  . Coronary artery bypass graft    . Total hip arthroplasty  08/21/2011    Procedure: TOTAL HIP ARTHROPLASTY;  Surgeon: Johnn Hai, MD;  Location:  [facility];  Service: Orthopedics;  Laterality: Right;  . Colonoscopy    . Esophagogastroduodenoscopy  multiple  . Penile prosthesis placement    . Back surgery    . Colon surgery    . Flexible sigmoidoscopy N/A 09/22/2013    Procedure: FLEXIBLE SIGMOIDOSCOPY;  Surgeon: Gatha Mayer, MD;  [facility]: [facility];  Service: Endoscopy;  Laterality: N/A;  . Colon resection N/A 11/28/2014    Procedure: EXPLORATORY LAPAROTOMY, SIGMOID COLECTOMY WITH COLOSTOMY;  Surgeon: Jackolyn Confer, MD;  Location: [facility];  Service: General;  Laterality: N/A;    Current Medications: Outpatient Prescriptions Prior to Visit  Medication Sig Dispense Refill  . acetaminophen (TYLENOL) 500 MG tablet Take 1,000 mg by mouth every 6 (six) hours as needed for mild pain or fever.    [name] albuterol (PROVENTIL HFA) 108 (90 BASE) MCG/ACT inhaler  Inhale 2 puffs into the lungs every 4 (four) hours as needed for shortness of breath. Wheezing     . albuterol (PROVENTIL) (2.5 MG/3ML) 0.083% nebulizer solution USE 1 VIAL WITH NEBULIZER EVERY 4 HOURS AS NEEDED FOR WHEEZING 75 mL 2  . aspirin 81 MG tablet Take 81 mg by mouth every evening.     [name] azithromycin (ZITHROMAX) 250 MG tablet Take 1 tablet (250 mg total) by mouth daily. 1 every day until finished. 4 tablet 0  . cephALEXin (KEFLEX) 500 MG capsule Take 1 capsule (500 mg total) by mouth 3 (three) times daily. 20 capsule 0  . dasatinib (SPRYCEL) 70 MG tablet Take 1 tablet (70 mg total) by mouth daily. (Patient taking differently: Take 70 mg by mouth every other day. ) 30 tablet 9  . fluticasone (FLONASE) 50 MCG/ACT nasal spray Place 2 sprays into both nostrils daily. Allergies 16 g 1  . glucose blood (ONE TOUCH TEST STRIPS) test strip Use to check blood sugars once a day Dx E11.9 100 each 3  . hydrochlorothiazide (MICROZIDE) 12.5 MG capsule Take 1 capsule (12.5 mg total) by mouth daily. 90 capsule 3  . HYDROcodone-acetaminophen (NORCO) 7.5-325 MG tablet Take 1 tablet by mouth every 6 (six) hours as needed for moderate pain.    . nitroGLYCERIN (NITROSTAT) 0.4 MG SL tablet Place 0.4 mg under the tongue every 5 (five) minutes as needed for chest pain. Chest pain    . Polyethyl Glycol-Propyl Glycol (SYSTANE) 0.4-0.3 % SOLN Apply 2 drops to eye 4 (four) times daily.     . pravastatin (PRAVACHOL) 20 MG tablet Take 3 tablets (60 mg total) by mouth daily. 270 tablet 3  . ranitidine (ZANTAC) 300 MG tablet Take 300 mg by mouth daily.     [name] triamcinolone cream (KENALOG) 0.1 % Apply 1 application topically 3 (three) times daily as needed (dry skin.).   3  . valsartan (DIOVAN) 160 MG tablet Take 1 tablet (160 mg total) by mouth daily. 90 tablet 3   No facility-administered medications prior to visit.     Allergies:   Actos; Celebrex; Demerol; Morphine and related; Ciprofloxacin; Metformin; and Zocor    Social History   Social History  . Marital Status: Widowed    Spouse Name: N/A  . Number of Children: N/A  . Years of Education: N/A   Occupational History  . retired    Social History Main Topics  . Smoking status: Former Smoker -- 1.00 packs/day for 25 years    Types: Cigarettes    Quit date: 06/08/1994  . Smokeless tobacco: Never Used  . Alcohol Use: No  . Drug  Use: No  . 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 Not on file   Social History Narrative     Family History:  The patient's family history includes Cancer in his mother; Heart attack in his brother; Hypertension in his mother. There is no history of Colon cancer or Stroke.   ROS:   Please see the history of present illness.    ROS All other systems reviewed and are negative.   PHYSICAL EXAM:   VS:  There were no vitals taken for this visit.  Physical Exam  GEN: Well nourished, well developed, in no acute distress HEENT: normal Neck: no JVD, carotid bruits, or masses Cardiac:RRR; no murmurs, rubs, or gallops  Respiratory:  clear to auscultation bilaterally, normal work of breathing GI: soft, nontender, nondistended, + BS Ext: without cyanosis, clubbing, or edema, Good distal pulses bilaterally MS: no deformity or atrophy Skin: warm and dry, no rash Neuro:  Alert and Oriented x 3, Strength and sensation are intact Psych: euthymic mood, full affect  Wt Readings from Last 3 Encounters:  11/27/15 233 lb 3.2 oz (105.779 kg)  10/28/15 234 lb 11.2 oz (106.459 kg)  10/20/15 220 lb (99.791 kg)      Studies/Labs Reviewed:   EKG:  EKG is ordered today.  The ekg ordered today demonstrates   Recent Labs: 10/28/2015: ALT 15 11/27/2015: Pro B Natriuretic peptide (BNP) 296.0* 12/01/2015: B Natriuretic Peptide 538.5*; BUN 18; Creatinine, Ser 1.21; Hemoglobin 9.0*; Platelets 179; Potassium 3.8; Sodium 134*   Lipid Panel     Component Value Date/Time   CHOL 108 05/27/2015 1512   TRIG 151.0* 05/27/2015 1512   TRIG 70 01/06/2010   HDL 22.70* 05/27/2015 1512   CHOLHDL 5 05/27/2015 1512   VLDL 30.2 05/27/2015 1512   LDLCALC 55 05/27/2015 1512    Additional studies/ records that were reviewed today include:  2-D echo 01/2014 Study Conclusions  - Left ventricle: The cavity size was normal. Wall thickness was   increased in a pattern of mild LVH. There was mild concentric   hypertrophy. Systolic function was normal. The estimated ejection   fraction was in the range of 50% to 55%. Wall motion was normal;   there were no regional wall motion abnormalities. Doppler   parameters are consistent with abnormal left ventricular   relaxation (grade 1 diastolic dysfunction). - Aortic valve: Cusp separation was reduced. - Mitral valve: Calcified annulus. - Left atrium: The atrium was mildly dilated.  Carotid Dopplers 04/2015 Heterogeneous plaque, bilaterally. Stable 1-39% RICA stenosis. Stable 123456 LICA stenosis. Normal subclavian arteries, bilaterally. Patent vertebral arteries with antegrade flow. f/u 2 years, stable over serial exams  Nuclear stress test: 07/2013:  IMPRESSION: Low risk myovue Small inferolateral wall infarct from apex to base no ischemia EF 64%   Jenkins Rouge     Electronically Signed   By: Jenkins Rouge M.D.   On: 07/18/2013 14:52          ASSESSMENT:    No diagnosis found.   PLAN:  In order of problems listed above:      Medication Adjustments/Labs and Tests Ordered: Current medicines are reviewed at length with the patient today.  Concerns regarding medicines are outlined above.  Medication changes, Labs and Tests ordered today are listed in the Patient Instructions below. There are no Patient Instructions on file for this visit.   Signed, Ermalinda Barrios,  PA-C  12/02/2015 9:07 AM    [address], [place], [place]   [address] Phone: [phone]; Fax: [phone]

## 2015-12-04 ENCOUNTER — Telehealth: Payer: Self-pay | Admitting: Internal Medicine

## 2015-12-04 DIAGNOSIS — M549 Dorsalgia, unspecified: Secondary | ICD-10-CM | POA: Diagnosis not present

## 2015-12-04 DIAGNOSIS — G8929 Other chronic pain: Secondary | ICD-10-CM | POA: Diagnosis not present

## 2015-12-04 NOTE — Telephone Encounter (Signed)
States has been trying for a week to get order.

## 2015-12-04 NOTE — Telephone Encounter (Signed)
Requesting order for nursing

## 2015-12-04 NOTE — Telephone Encounter (Signed)
Patient is coming in on 12/06/15 for a visit for Dr. Sharlet Salina to fill out face to face. I have the form at my desk. Patient is aware.

## 2015-12-05 DIAGNOSIS — M549 Dorsalgia, unspecified: Secondary | ICD-10-CM | POA: Diagnosis not present

## 2015-12-05 DIAGNOSIS — G8929 Other chronic pain: Secondary | ICD-10-CM | POA: Diagnosis not present

## 2015-12-06 ENCOUNTER — Ambulatory Visit (INDEPENDENT_AMBULATORY_CARE_PROVIDER_SITE_OTHER): Payer: Medicare Other | Admitting: Internal Medicine

## 2015-12-06 ENCOUNTER — Encounter: Payer: Self-pay | Admitting: Internal Medicine

## 2015-12-06 VITALS — BP 144/82 | HR 83 | Temp 97.9°F | Resp 20 | Ht 66.0 in | Wt 225.0 lb

## 2015-12-06 DIAGNOSIS — I6523 Occlusion and stenosis of bilateral carotid arteries: Secondary | ICD-10-CM | POA: Diagnosis not present

## 2015-12-06 DIAGNOSIS — J189 Pneumonia, unspecified organism: Secondary | ICD-10-CM

## 2015-12-06 DIAGNOSIS — I5032 Chronic diastolic (congestive) heart failure: Secondary | ICD-10-CM | POA: Diagnosis not present

## 2015-12-06 NOTE — Assessment & Plan Note (Addendum)
Still taking keflex and looked up today, no interactions with his meds. Is limiting his ADLs and function and needs home health for PT/OT, social work, home health aide. No nursing needs at this time. He will finish the antibiotics and return for chest x-ray for clearance in about 6-8 weeks. Start of care date 11/14/15 based on ER notes and inability of the patient to come to the office for visit until now. I certify that he does need the required services.

## 2015-12-06 NOTE — Progress Notes (Signed)
   Subjective:    Patient ID: Eric [name], male    DOB: September 26, 1926, 82 y.o.   MRN: [id number]  HPI The patient is an 82 YO man coming in for follow up of HCAP, he has finished the antibiotics azithromycin but still has keflex. He was not sure he should take it due to his sprycel. He was very SOB and not able to do his ADLs. He does need some assistance with ADLs still due to deconditioning. He is gradually improving but still needing help.   Review of Systems  Constitutional: Positive for fatigue. Negative for fever, chills, activity change, appetite change and unexpected weight change.  HENT: Positive for congestion. Negative for postnasal drip, rhinorrhea and sinus pressure.   Eyes: Negative.   Respiratory: Positive for cough and shortness of breath. Negative for chest tightness and wheezing.        Improving  Cardiovascular: Negative for chest pain, palpitations and leg swelling.  Gastrointestinal: Negative for nausea, vomiting, abdominal pain, diarrhea, constipation and abdominal distention.  Musculoskeletal: Positive for back pain, arthralgias and gait problem. Negative for myalgias and joint swelling.  Neurological: Positive for weakness. Negative for dizziness, seizures, numbness and headaches.      Objective:   Physical Exam  Constitutional: He is oriented to person, place, and time. He appears well-developed and well-nourished.  HENT:  Head: Normocephalic and atraumatic.  Right Ear: External ear normal.  Left Ear: External ear normal.  Mouth/Throat: Oropharynx is clear and moist.  Eyes: EOM are normal.  Neck: Normal range of motion.  Cardiovascular: Normal rate and regular rhythm.   Pulmonary/Chest: Effort normal. No respiratory distress. He has no wheezes.  Abdominal: Soft. He exhibits no distension. There is no tenderness. There is no rebound.  Musculoskeletal: He exhibits no edema.  Neurological: He is alert and oriented to person, place, and time. Coordination  normal.  Skin: Skin is warm and dry.  Psychiatric: He has a normal mood and affect.   Filed Vitals:   12/06/15 1036  BP: 144/82  Pulse: 83  Temp: 97.9 F (36.6 C)  TempSrc: Oral  Resp: 20  Height: 5\' 6"  (1.676 m)  Weight: 225 lb (102.059 kg)  SpO2: 93%      Assessment & Plan:

## 2015-12-06 NOTE — Patient Instructions (Signed)
We will get the papers filled out for the aide and the therapy so that it is covered.

## 2015-12-06 NOTE — Progress Notes (Signed)
Pre visit review using our clinic review tool, if applicable. No additional management support is needed unless otherwise documented below in the visit note. 

## 2015-12-06 NOTE — Assessment & Plan Note (Signed)
No exacerbation today but this does limit his QOL and endurance chronically. Weight is stable.

## 2015-12-09 ENCOUNTER — Telehealth: Payer: Self-pay | Admitting: Internal Medicine

## 2015-12-09 ENCOUNTER — Telehealth: Payer: Self-pay | Admitting: Hematology and Oncology

## 2015-12-09 NOTE — Telephone Encounter (Signed)
Left message for patient to call back  

## 2015-12-09 NOTE — Telephone Encounter (Signed)
returned call and s.w pt and confirmed appts....pt ok and aware °

## 2015-12-09 NOTE — Telephone Encounter (Signed)
Pt stated that home health only do therapy, but he wants someone to help him with house cleaning. Please call back

## 2015-12-09 NOTE — Telephone Encounter (Signed)
Tried to reach patient again, left message.

## 2015-12-09 NOTE — Telephone Encounter (Signed)
Patient called and is unhappy with home health. He doesn't think they are being very helpful. Please give him a call back.

## 2015-12-12 ENCOUNTER — Other Ambulatory Visit (HOSPITAL_BASED_OUTPATIENT_CLINIC_OR_DEPARTMENT_OTHER): Payer: Medicare Other

## 2015-12-12 ENCOUNTER — Ambulatory Visit (HOSPITAL_BASED_OUTPATIENT_CLINIC_OR_DEPARTMENT_OTHER): Payer: Medicare Other | Admitting: Hematology and Oncology

## 2015-12-12 ENCOUNTER — Other Ambulatory Visit: Payer: Self-pay

## 2015-12-12 ENCOUNTER — Encounter: Payer: Self-pay | Admitting: Hematology and Oncology

## 2015-12-12 ENCOUNTER — Encounter: Payer: Medicare Other | Admitting: Physician Assistant

## 2015-12-12 ENCOUNTER — Encounter: Payer: Self-pay | Admitting: Physician Assistant

## 2015-12-12 VITALS — BP 158/46 | HR 64 | Temp 98.4°F | Resp 20 | Wt 226.4 lb

## 2015-12-12 DIAGNOSIS — J449 Chronic obstructive pulmonary disease, unspecified: Secondary | ICD-10-CM

## 2015-12-12 DIAGNOSIS — C9210 Chronic myeloid leukemia, BCR/ABL-positive, not having achieved remission: Secondary | ICD-10-CM

## 2015-12-12 DIAGNOSIS — D630 Anemia in neoplastic disease: Secondary | ICD-10-CM

## 2015-12-12 LAB — COMPREHENSIVE METABOLIC PANEL
ALT: 17 U/L (ref 0–55)
ANION GAP: 8 meq/L (ref 3–11)
AST: 23 U/L (ref 5–34)
Albumin: 3.9 g/dL (ref 3.5–5.0)
Alkaline Phosphatase: 71 U/L (ref 40–150)
BUN: 19.7 mg/dL (ref 7.0–26.0)
CALCIUM: 10 mg/dL (ref 8.4–10.4)
CHLORIDE: 104 meq/L (ref 98–109)
CO2: 22 meq/L (ref 22–29)
CREATININE: 1 mg/dL (ref 0.7–1.3)
EGFR: 68 mL/min/{1.73_m2} — ABNORMAL LOW (ref >90)
Glucose: 153 mg/dl — ABNORMAL HIGH (ref 70–140)
POTASSIUM: 4.5 meq/L (ref 3.5–5.1)
Sodium: 134 mEq/L — ABNORMAL LOW (ref 136–145)
Total Bilirubin: 0.42 mg/dL (ref 0.20–1.20)
Total Protein: 7.3 g/dL (ref 6.4–8.3)

## 2015-12-12 LAB — CBC WITH DIFFERENTIAL/PLATELET
BASO%: 0.2 % (ref 0.0–2.0)
Basophils Absolute: 0 10*3/uL (ref 0.0–0.1)
EOS ABS: 0.1 10*3/uL (ref 0.0–0.5)
EOS%: 2.6 % (ref 0.0–7.0)
HEMATOCRIT: 30 % — AB (ref 38.4–49.9)
HEMOGLOBIN: 9.9 g/dL — AB (ref 13.0–17.1)
LYMPH%: 5.6 % — ABNORMAL LOW (ref 14.0–49.0)
MCH: 33.4 pg (ref 27.2–33.4)
MCHC: 33.1 g/dL (ref 32.0–36.0)
MCV: 101.1 fL — AB (ref 79.3–98.0)
MONO#: 0.5 10*3/uL (ref 0.1–0.9)
MONO%: 9 % (ref 0.0–14.0)
NEUT%: 82.6 % — ABNORMAL HIGH (ref 39.0–75.0)
NEUTROS ABS: 4.7 10*3/uL (ref 1.5–6.5)
PLATELETS: 236 10*3/uL (ref 140–400)
RBC: 2.97 10*6/uL — AB (ref 4.20–5.82)
RDW: 14.2 % (ref 11.0–14.6)
WBC: 5.6 10*3/uL (ref 4.0–10.3)
lymph#: 0.3 10*3/uL — ABNORMAL LOW (ref 0.9–3.3)

## 2015-12-12 NOTE — Assessment & Plan Note (Signed)
His symptoms are stable on medical management Continue conservative management

## 2015-12-12 NOTE — Assessment & Plan Note (Signed)
This is likely due to recent treatment. The patient denies recent history of bleeding such as epistaxis, hematuria or hematochezia. He is asymptomatic from the anemia. I will observe for now. I plan to reduce the dose of Sprycel in the future if I can confirm remission status.

## 2015-12-12 NOTE — Assessment & Plan Note (Addendum)
He has excellent response to treatment with normalization of his leukocytosis. I am a little concerned about his anemia. Certainly, the anemia is multifactorial related to recent infection and his diagnosis of CML. If repeat PCR for BCR/ABL came back showing that he is in molecular remission, I will reduce the dose of Sprycel. The patient stated that he has a lot of prescription left over. I plan to reduce the dose every other day and in the future will refill his prescription at a much lower dose at 50 mg He agreed with the plan of care

## 2015-12-12 NOTE — Progress Notes (Signed)
ROS   I walked in to interview the patient and he appears to be very irritated and states he is not sure why he is here. I have reviewed the patient's medical record prior to the office visit including:  Eric [name] is a 82 y.o. male with past medical history of ischemic cardiomyopathy, sick sinus syndrome status post [employer] PPM 09/2007, PVD, chronic diastolic HF, COPD, osteoarthritis, type 2 diabetes diet controlled, hypertension, CAD s/p CABG 1995 with multiple ED CA/DES 2008/2009/2012, primary hyperparathyroidism, obstructive sleep apnea, and chronic myeloid leukemia followed on Sprycel by Dr. Heath Lark. She had a cardiac catheterization in 2009 at which time vein graft to diagonal was stented. In 2012, he underwent stenting of his vein graft to OM. Last LDL obtained in 2/15 EF 64%, low risk, small inferolateral wall infarct from apex to base without ischemia. Last echocardiogram obtained on 02/28/2014 showed EF [id number], grade 1 diastolic dysfunction, no RWMA, mild LVH. Her last visit with Dr. Caryl Comes was on 08/26/2015. The it appears that she had a interrogation that showed one episode of atrial fibrillation in 8/16 that lasted 20 minutes, it was felt that the patient did not justify anticoagulation therapy.   He says he visited the ED recently, ED note is not completed at this time. I have reviewed the chest x-ray obtained in the ED which showed no acute process. There was mention of chest discomfort or shortness of breath upon ED visit. Patient adamantly denies any chest discomfort. He says he was previously short of breath. Because of pneumonia, however this has resolved. Therefore he is very irritated that someone told him he needed to have a cardiology follow-up. I am unable to locate any records to suggest he has any acute cardiology issue. Based on today's oncology note, patient has been doing well from the perspective for chronic myeloid leukemia and likely will be in remission. Previous note  done by Dr. Caryl Comes in March and the Dr. Tamala Julian in February suggest he should have one year follow-up. He appears to be quite stable and does not have any acute sign of heart failure. Therefore I will cancel today's appointment, our office will contact him in February to follow-up with Dr. Tamala Julian.  His BP is mildly high today in 140s, he may increase hydrochlorothiazide to 25 mg daily if continued to have high blood pressure or has increased swelling in the leg.  Hilbert Corrigan PA Pager: [phone]  This encounter was created in error - please disregard.

## 2015-12-12 NOTE — Progress Notes (Signed)
[facility] OFFICE PROGRESS NOTE  Patient Care Team: [name], MD as PCP - General (Internal Medicine) Belva Crome, MD as Consulting Physician (Cardiology) Suella Broad, MD as Consulting Physician (Physical Medicine and Rehabilitation) Deboraha Sprang, MD as Consulting Physician (Cardiology) Gatha Mayer, MD as Consulting Physician (Gastroenterology) Irine Seal, MD as Consulting Physician (Urology) Melida Quitter, MD as Consulting Physician (Otolaryngology) Susa Day, MD (Orthopedic Surgery) Deneise Lever, MD (Pulmonary Disease) Carol Ada, MD (Gastroenterology) Allyn Kenner, MD (Dermatology)  SUMMARY OF ONCOLOGIC HISTORY:   CML (chronic myeloid leukemia) ([place])   06/19/2015 Pathology Results Peripheral blood was positive for BCR/ABL at 45.49% a1a2 and on IS 37.76%   06/20/2015 Bone Marrow Biopsy Accession: [id number] BM biopsy confirmed CML. 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 He returns for further follow-up. He is seen sooner than his anticipated appointment due to recent anemia. He complained of fatigue. He was recently treated with a course of antibiotic therapy for possible infection. He has occasional loose stool. Overall, he tolerated Sprycel well  REVIEW OF SYSTEMS:   Constitutional: Denies fevers, chills or abnormal weight loss Eyes: Denies blurriness of vision Ears, nose, mouth, throat, and face: Denies mucositis or sore throat Respiratory: Denies cough, dyspnea or wheezes Cardiovascular: Denies palpitation, chest discomfort or lower extremity swelling Lymphatics: Denies new lymphadenopathy or easy bruising Neurological:Denies numbness, tingling or new weaknesses Behavioral/Psych: Mood is stable,  no new changes  All other systems were reviewed with the patient and are negative.  I have reviewed the past medical history, past surgical history, social history and family history with the patient and they are unchanged from previous note.  ALLERGIES:  is allergic to actos; celebrex; demerol; morphine and related; ciprofloxacin; metformin; and zocor.  MEDICATIONS:  Current Outpatient Prescriptions  Medication Sig Dispense Refill  . acetaminophen (TYLENOL) 500 MG tablet Take 1,000 mg by mouth every 6 (six) hours as needed for mild pain or fever.    [name] albuterol (PROVENTIL HFA) 108 (90 BASE) MCG/ACT inhaler Inhale 2 puffs into the lungs every 4 (four) hours as needed for shortness of breath. Wheezing     . albuterol (PROVENTIL) (2.5 MG/3ML) 0.083% nebulizer solution USE 1 VIAL WITH NEBULIZER EVERY 4 HOURS AS NEEDED FOR WHEEZING 75 mL 2  . aspirin 81 MG tablet Take 81 mg by mouth every evening.     . cephALEXin (KEFLEX) 500 MG capsule Take 1 capsule (500 mg total) by mouth 3 (three) times daily. 20 capsule 0  . dasatinib (SPRYCEL) 70 MG tablet Take 1 tablet (70 mg total) by mouth daily. (Patient taking differently: Take 70 mg by mouth every other day. ) 30 tablet 9  . fluticasone (FLONASE) 50 MCG/ACT nasal spray Place 2 sprays into both nostrils daily. Allergies 16 g 1  . glucose blood (ONE TOUCH TEST STRIPS) test strip Use to check blood sugars once a day Dx E11.9 100 each 3  . hydrochlorothiazide (MICROZIDE) 12.5 MG capsule Take 1 capsule (12.5 mg total) by mouth daily. 90 capsule 3  . HYDROcodone-acetaminophen (NORCO) 7.5-325 MG tablet Take 1 tablet by mouth every 6 (six) hours as needed for moderate pain.    Vladimir Faster Glycol-Propyl Glycol (SYSTANE) 0.4-0.3 % SOLN Apply 2 drops to  eye 4 (four) times daily.     . pravastatin (PRAVACHOL) 20 MG tablet Take 3 tablets (60 mg total) by mouth daily. 270 tablet 3  . ranitidine (ZANTAC) 300 MG tablet Take 300 mg by mouth daily.     [name]  triamcinolone cream (KENALOG) 0.1 % Apply 1 application topically 3 (three) times daily as needed (dry skin.).   3  . valsartan (DIOVAN) 160 MG tablet Take 1 tablet (160 mg total) by mouth daily. 90 tablet 3  . nitroGLYCERIN (NITROSTAT) 0.4 MG SL tablet Place 0.4 mg under the tongue every 5 (five) minutes as needed for chest pain. Reported on 12/12/2015     No current facility-administered medications for this visit.    PHYSICAL EXAMINATION: ECOG PERFORMANCE STATUS: 1 - Symptomatic but completely ambulatory  Filed Vitals:   12/12/15 1300  BP: 158/46  Pulse: 64  Temp: 98.4 F (36.9 C)  Resp: 20   Filed Weights   12/12/15 1300  Weight: 226 lb 7 oz (102.711 kg)    GENERAL:alert, no distress and comfortable. He is obese SKIN: skin color, texture, turgor are normal, no rashes or significant lesions. Noted skin bruising EYES: normal, Conjunctiva are pink and non-injected, sclera clear OROPHARYNX:no exudate, no erythema and lips, buccal mucosa, and tongue normal  NECK: supple, thyroid normal size, non-tender, without nodularity LYMPH:  no palpable lymphadenopathy in the cervical, axillary or inguinal LUNGS: clear to auscultation and percussion with normal breathing effort HEART: regular rate & rhythm and no murmurs and no lower extremity edema ABDOMEN:abdomen soft, non-tender and normal bowel sounds Musculoskeletal:no cyanosis of digits and no clubbing  NEURO: alert & oriented x 3 with fluent speech, no focal motor/sensory deficits  LABORATORY DATA:  I have reviewed the data as listed    Component Value Date/Time   NA 134* 12/12/2015 1239   NA 134* 12/01/2015 2040   NA 135* 04/11/2014   K 4.5 12/12/2015 1239   K 3.8 12/01/2015 2040   CL 104 12/01/2015 2040   CO2 22 12/12/2015 1239   CO2 22 12/01/2015 2040   GLUCOSE 153* 12/12/2015 1239   GLUCOSE 142* 12/01/2015 2040   BUN 19.7 12/12/2015 1239   BUN 18 12/01/2015 2040   BUN 11 04/11/2014   CREATININE 1.0 12/12/2015 1239    CREATININE 1.21 12/01/2015 2040   CREATININE 1.07 07/31/2015 1024   CREATININE 1.0 04/11/2014   CALCIUM 10.0 12/12/2015 1239   CALCIUM 9.7 12/01/2015 2040   PROT 7.3 12/12/2015 1239   PROT 7.3 05/27/2015 1512   ALBUMIN 3.9 12/12/2015 1239   ALBUMIN 4.1 05/27/2015 1512   AST 23 12/12/2015 1239   AST 23 05/27/2015 1512   ALT 17 12/12/2015 1239   ALT 15 05/27/2015 1512   ALKPHOS 71 12/12/2015 1239   ALKPHOS 95 05/27/2015 1512   BILITOT 0.42 12/12/2015 1239   BILITOT 0.6 05/27/2015 1512   GFRNONAA 52* 12/01/2015 2040   GFRAA >60 12/01/2015 2040    No results found for: SPEP, UPEP  Lab Results  Component Value Date   WBC 5.6 12/12/2015   NEUTROABS 4.7 12/12/2015   HGB 9.9* 12/12/2015   HCT 30.0* 12/12/2015   MCV 101.1* 12/12/2015   PLT 236 12/12/2015      Chemistry      Component Value Date/Time   NA 134* 12/12/2015 1239   NA 134* 12/01/2015 2040   NA 135* 04/11/2014   K 4.5 12/12/2015 1239   K 3.8 12/01/2015 2040   CL 104 12/01/2015 2040  CO2 22 12/12/2015 1239   CO2 22 12/01/2015 2040   BUN 19.7 12/12/2015 1239   BUN 18 12/01/2015 2040   BUN 11 04/11/2014   CREATININE 1.0 12/12/2015 1239   CREATININE 1.21 12/01/2015 2040   CREATININE 1.07 07/31/2015 1024   CREATININE 1.0 04/11/2014   GLU 158 04/11/2014      Component Value Date/Time   CALCIUM 10.0 12/12/2015 1239   CALCIUM 9.7 12/01/2015 2040   ALKPHOS 71 12/12/2015 1239   ALKPHOS 95 05/27/2015 1512   AST 23 12/12/2015 1239   AST 23 05/27/2015 1512   ALT 17 12/12/2015 1239   ALT 15 05/27/2015 1512   BILITOT 0.42 12/12/2015 1239   BILITOT 0.6 05/27/2015 1512      ASSESSMENT & PLAN:  CML (chronic myeloid leukemia) ([place])  He has excellent response to treatment with normalization of his leukocytosis. I am a little concerned about his anemia. Certainly, the anemia is multifactorial related to recent infection and his diagnosis of CML. If repeat PCR for BCR/ABL came back showing that he is in molecular  remission, I will reduce the dose of Sprycel. The patient stated that he has a lot of prescription left over. I plan to reduce the dose every other day and in the future will refill his prescription at a much lower dose at 50 mg He agreed with the plan of care  COPD mixed type [facility]) His symptoms are stable on medical management Continue conservative management    Anemia in neoplastic disease This is likely due to recent treatment. The patient denies recent history of bleeding such as epistaxis, hematuria or hematochezia. He is asymptomatic from the anemia. I will observe for now. I plan to reduce the dose of Sprycel in the future if I can confirm remission status.   No orders of the defined types were placed in this encounter.   All questions were answered. The patient knows to call the clinic with any problems, questions or concerns. No barriers to learning was detected. I spent 15 minutes counseling the patient face to face. The total time spent in the appointment was 20 minutes and more than 50% was on counseling and review of test results     [facility], [place], MD 12/12/2015 2:50 PM  ,

## 2015-12-13 ENCOUNTER — Telehealth: Payer: Self-pay | Admitting: Emergency Medicine

## 2015-12-13 ENCOUNTER — Encounter: Payer: Self-pay | Admitting: Pharmacist

## 2015-12-13 DIAGNOSIS — G8929 Other chronic pain: Secondary | ICD-10-CM | POA: Diagnosis not present

## 2015-12-13 DIAGNOSIS — M549 Dorsalgia, unspecified: Secondary | ICD-10-CM | POA: Diagnosis not present

## 2015-12-13 NOTE — Telephone Encounter (Signed)
Faxed office notes

## 2015-12-13 NOTE — Telephone Encounter (Signed)
Eric [name] from [facility] called and needs chart information from the visit on 12/06/15 faxed over. Fax #: [phone]. Thanks.

## 2015-12-16 ENCOUNTER — Ambulatory Visit: Payer: Medicare Other | Admitting: Acute Care

## 2015-12-16 DIAGNOSIS — M549 Dorsalgia, unspecified: Secondary | ICD-10-CM | POA: Diagnosis not present

## 2015-12-16 DIAGNOSIS — G8929 Other chronic pain: Secondary | ICD-10-CM | POA: Diagnosis not present

## 2015-12-18 ENCOUNTER — Encounter: Payer: Self-pay | Admitting: *Deleted

## 2015-12-18 ENCOUNTER — Telehealth: Payer: Self-pay | Admitting: Acute Care

## 2015-12-18 DIAGNOSIS — M549 Dorsalgia, unspecified: Secondary | ICD-10-CM | POA: Diagnosis not present

## 2015-12-18 DIAGNOSIS — G8929 Other chronic pain: Secondary | ICD-10-CM | POA: Diagnosis not present

## 2015-12-18 NOTE — Telephone Encounter (Signed)
PATIENT IS IN LOBBY.  He states he needs letter as stated below and has an appointment with the [facility] in the am at 11:30 am.

## 2015-12-18 NOTE — Telephone Encounter (Signed)
Letter done and signed by Judson Roch. Letter given to pt. Nothing further needed

## 2015-12-19 ENCOUNTER — Telehealth: Payer: Self-pay | Admitting: Hematology and Oncology

## 2015-12-19 NOTE — Telephone Encounter (Signed)
Re faxed notes

## 2015-12-19 NOTE — Telephone Encounter (Signed)
I left the patient a VM and talked to his daughter, [name]. His blood test BCR/ABL continues to improve but remained detectable. He does not meet the criteria for major molecular response. He has achieved initial milestone of achieving hematology response I recommend he continue on current dose of Sprycel and I will see him back next month for further evaluation.

## 2015-12-19 NOTE — Telephone Encounter (Signed)
Please resend OV note.  Did not receive.

## 2015-12-20 ENCOUNTER — Telehealth: Payer: Self-pay | Admitting: Pharmacist

## 2015-12-20 DIAGNOSIS — M549 Dorsalgia, unspecified: Secondary | ICD-10-CM | POA: Diagnosis not present

## 2015-12-20 DIAGNOSIS — G8929 Other chronic pain: Secondary | ICD-10-CM | POA: Diagnosis not present

## 2015-12-20 NOTE — Progress Notes (Signed)
Received Sprycel on 12/13/15.  Pt saw MD on 12/12/15. MD may change dose depending on results of pending labs. Will confirm Sprycel dose when results received and confirm with MD. MD may decrease to 50mg  daily. Pt isn't in need of drug refill at this time.  [name], PharmD, BCPS, [facility] [phone]

## 2015-12-20 NOTE — Telephone Encounter (Addendum)
Received VM from [name]. They wanted to confirm pt was still requiring assistance. They also wanted to inform us that his next refill was shipped on 12/12/15 to our office and received on 12/13/15. Phone:  [phone] I called program back and confirmed that pt still needed assistance and per MD note on 12/19/15 - he was continuing on the same Sprycel dose. I confirmed that we did receive the drug on 12/13/15.   I also called the patient to let him know that his next bottle of Sprycel was here to pick-up at his convenience. Dr. Alvy Bimler was continuing him on the same dose per 12/19/15 note. He will be here on 12/20/15 ~ 11am to pick up the medication.  [name], PharmD, BCPS, [facility] [phone]

## 2015-12-23 ENCOUNTER — Encounter: Payer: Self-pay | Admitting: Pharmacist

## 2015-12-23 NOTE — Progress Notes (Signed)
Pt came to [facility] and picked up his Sprycel today. 1 bottle x 60 tablets. 70 mg. [id number], Exp: 02/05/2018  [name], PharmD, BCPS, [facility] [phone]

## 2015-12-24 DIAGNOSIS — G8929 Other chronic pain: Secondary | ICD-10-CM | POA: Diagnosis not present

## 2015-12-24 DIAGNOSIS — M549 Dorsalgia, unspecified: Secondary | ICD-10-CM | POA: Diagnosis not present

## 2015-12-25 DIAGNOSIS — H02054 Trichiasis without entropian left upper eyelid: Secondary | ICD-10-CM | POA: Diagnosis not present

## 2015-12-25 DIAGNOSIS — H02051 Trichiasis without entropian right upper eyelid: Secondary | ICD-10-CM | POA: Diagnosis not present

## 2015-12-25 DIAGNOSIS — H02055 Trichiasis without entropian left lower eyelid: Secondary | ICD-10-CM | POA: Diagnosis not present

## 2015-12-26 DIAGNOSIS — M549 Dorsalgia, unspecified: Secondary | ICD-10-CM | POA: Diagnosis not present

## 2015-12-26 DIAGNOSIS — G8929 Other chronic pain: Secondary | ICD-10-CM | POA: Diagnosis not present

## 2015-12-30 DIAGNOSIS — M549 Dorsalgia, unspecified: Secondary | ICD-10-CM | POA: Diagnosis not present

## 2015-12-30 DIAGNOSIS — G8929 Other chronic pain: Secondary | ICD-10-CM | POA: Diagnosis not present

## 2016-01-10 ENCOUNTER — Telehealth: Payer: Self-pay | Admitting: Internal Medicine

## 2016-01-10 DIAGNOSIS — J449 Chronic obstructive pulmonary disease, unspecified: Secondary | ICD-10-CM

## 2016-01-10 NOTE — Telephone Encounter (Signed)
Called and spoke with pt and he stated that he is needing to have [place] come and pick up his oxygen.  He stated that the [place] and the walk that he did here at the office with TP---stated that he did not need the oxgyen.  Pt is wanting to have these picked up.  CY please advise. Thanks  Ov with SG--11/27/15 Last ov with CY--09/10/15 Next ov--03/16/16  Allergies  Allergen Reactions  . Actos [Pioglitazone Hydrochloride] Other (See Comments)    "felt funny, drowsy, and weak":  . Celebrex [Celecoxib] Other (See Comments)    "felt funny"  . Demerol Palpitations and Other (See Comments)    Increased BP  . Morphine And Related Nausea And Vomiting  . Ciprofloxacin Other (See Comments)    arthralgia  . Metformin Nausea And Vomiting  . Zocor [Simvastatin] Other (See Comments)    Makes pt very drowsy

## 2016-01-10 NOTE — Telephone Encounter (Signed)
Ok to dc O2 

## 2016-01-10 NOTE — Telephone Encounter (Signed)
Order placed for pt to have his oxygen tanks picked up from [facility].  Order has been placed. Nothing further is needed.

## 2016-01-21 ENCOUNTER — Other Ambulatory Visit (HOSPITAL_BASED_OUTPATIENT_CLINIC_OR_DEPARTMENT_OTHER): Payer: Medicare Other

## 2016-01-21 ENCOUNTER — Other Ambulatory Visit: Payer: Self-pay | Admitting: Hematology and Oncology

## 2016-01-21 DIAGNOSIS — C9210 Chronic myeloid leukemia, BCR/ABL-positive, not having achieved remission: Secondary | ICD-10-CM

## 2016-01-21 LAB — COMPREHENSIVE METABOLIC PANEL
ALK PHOS: 59 U/L (ref 40–150)
ALT: 20 U/L (ref 0–55)
AST: 30 U/L (ref 5–34)
Albumin: 3.9 g/dL (ref 3.5–5.0)
Anion Gap: 6 mEq/L (ref 3–11)
BILIRUBIN TOTAL: 0.65 mg/dL (ref 0.20–1.20)
BUN: 19.6 mg/dL (ref 7.0–26.0)
CO2: 23 mEq/L (ref 22–29)
CREATININE: 1.1 mg/dL (ref 0.7–1.3)
Calcium: 10.4 mg/dL (ref 8.4–10.4)
Chloride: 107 mEq/L (ref 98–109)
EGFR: 59 mL/min/{1.73_m2} — ABNORMAL LOW (ref >90)
GLUCOSE: 114 mg/dL (ref 70–140)
Potassium: 4.4 mEq/L (ref 3.5–5.1)
SODIUM: 136 meq/L (ref 136–145)
TOTAL PROTEIN: 7.1 g/dL (ref 6.4–8.3)

## 2016-01-21 LAB — CBC WITH DIFFERENTIAL/PLATELET
BASO%: 0.3 % (ref 0.0–2.0)
Basophils Absolute: 0 10*3/uL (ref 0.0–0.1)
EOS%: 3.1 % (ref 0.0–7.0)
Eosinophils Absolute: 0.1 10*3/uL (ref 0.0–0.5)
HCT: 30.1 % — ABNORMAL LOW (ref 38.4–49.9)
HGB: 9.8 g/dL — ABNORMAL LOW (ref 13.0–17.1)
LYMPH%: 12.4 % — AB (ref 14.0–49.0)
MCH: 32.3 pg (ref 27.2–33.4)
MCHC: 32.5 g/dL (ref 32.0–36.0)
MCV: 99.2 fL — ABNORMAL HIGH (ref 79.3–98.0)
MONO#: 0.7 10*3/uL (ref 0.1–0.9)
MONO%: 16.1 % — AB (ref 0.0–14.0)
NEUT%: 68.1 % (ref 39.0–75.0)
NEUTROS ABS: 2.8 10*3/uL (ref 1.5–6.5)
Platelets: 126 10*3/uL — ABNORMAL LOW (ref 140–400)
RBC: 3.03 10*6/uL — AB (ref 4.20–5.82)
RDW: 14.7 % — ABNORMAL HIGH (ref 11.0–14.6)
WBC: 4.1 10*3/uL (ref 4.0–10.3)
lymph#: 0.5 10*3/uL — ABNORMAL LOW (ref 0.9–3.3)

## 2016-01-28 ENCOUNTER — Encounter: Payer: Self-pay | Admitting: Hematology and Oncology

## 2016-01-28 ENCOUNTER — Telehealth: Payer: Self-pay | Admitting: *Deleted

## 2016-01-28 ENCOUNTER — Ambulatory Visit (HOSPITAL_BASED_OUTPATIENT_CLINIC_OR_DEPARTMENT_OTHER): Payer: Medicare Other | Admitting: Hematology and Oncology

## 2016-01-28 DIAGNOSIS — C9210 Chronic myeloid leukemia, BCR/ABL-positive, not having achieved remission: Secondary | ICD-10-CM | POA: Diagnosis not present

## 2016-01-28 DIAGNOSIS — J449 Chronic obstructive pulmonary disease, unspecified: Secondary | ICD-10-CM

## 2016-01-28 DIAGNOSIS — K521 Toxic gastroenteritis and colitis: Secondary | ICD-10-CM | POA: Insufficient documentation

## 2016-01-28 DIAGNOSIS — D61818 Other pancytopenia: Secondary | ICD-10-CM | POA: Diagnosis not present

## 2016-01-28 NOTE — Assessment & Plan Note (Signed)
This is likely due to recent treatment. The patient denies recent history of bleeding such as epistaxis, hematuria or hematochezia. He is asymptomatic from the anemia & thrombocytopenia. I will observe for now.  He does not require transfusion now. I will continue the chemotherapy at current dose without dosage adjustment.  If the anemia gets progressive worse in the future, I might have to delay his treatment or adjust the chemotherapy dose.

## 2016-01-28 NOTE — Assessment & Plan Note (Signed)
The patient tolerated treatment poorly with pancytopenia, diarrhea and fatigue. Even though CBC showed that his white blood cell count has normalized, BCR/ABL by PCR techniques showed it is still detectable at 3%. I will like to get it under 1% by the next time I see him back. In most cases, I would like to increase the dose 100 mg but due to his age and poor tolerance, I would keep at current dose for now.

## 2016-01-28 NOTE — Assessment & Plan Note (Signed)
His symptoms are stable on medical management Continue conservative management. I defer treatment to his pulmonologist

## 2016-01-28 NOTE — Progress Notes (Signed)
[place] OFFICE PROGRESS NOTE  Patient Care Team: [name], MD as PCP - General (Internal Medicine) Belva Crome, MD as Consulting Physician (Cardiology) Suella Broad, MD as Consulting Physician (Physical Medicine and Rehabilitation) Deboraha Sprang, MD as Consulting Physician (Cardiology) Gatha Mayer, MD as Consulting Physician (Gastroenterology) Irine Seal, MD as Consulting Physician (Urology) Melida Quitter, MD as Consulting Physician (Otolaryngology) Susa Day, MD (Orthopedic Surgery) Deneise Lever, MD (Pulmonary Disease) Carol Ada, MD (Gastroenterology) Allyn Kenner, MD (Dermatology)  SUMMARY OF ONCOLOGIC HISTORY:   CML (chronic myeloid leukemia) ([place])   06/19/2015 Pathology Results    Peripheral blood was positive for BCR/ABL at 45.49% a1a2 and on IS 37.76%      06/20/2015 Bone Marrow Biopsy    Accession: [id number] BM biopsy confirmed CML. Cytogenetics showed 9;22 translocation and deletion Y      06/26/2015 -  Chemotherapy    He is started on Sprycel for [facility]      10/28/2015 Tumor Marker    Peripheral blood was positive for BCR/ABL at 5.44% a1a2 and on IS 4.52%      12/12/2015 Tumor Marker    Peripheral blood was positive for BCR/ABL at 4.67% a1a2 and on IS 3.88%      01/21/2016 Tumor Marker    Patient's tumor was tested for the following markers: BCR/ABL by PCR. Results of the tumor marker test revealed:Peripheral blood was positive for BCR/ABL at 3.08% a1a2 and on IS 2.55%       INTERVAL HISTORY: Please see below for problem oriented charting. He returns for follow-up. He is compliant taking Sprycel. He complained of fatigue and diarrhea. He has occasional bleeding through the colostomy tube but typically resolve when he discontinue aspirin.  REVIEW OF SYSTEMS:   Constitutional: Denies fevers, chills or abnormal weight loss Eyes: Denies blurriness of vision Ears, nose, mouth, throat, and face: Denies mucositis or sore  throat Respiratory: Denies cough, dyspnea or wheezes Cardiovascular: Denies palpitation, chest discomfort or lower extremity swelling Skin: Denies abnormal skin rashes Lymphatics: Denies new lymphadenopathy or easy bruising Neurological:Denies numbness, tingling or new weaknesses Behavioral/Psych: Mood is stable, no new changes  All other systems were reviewed with the patient and are negative.  I have reviewed the past medical history, past surgical history, social history and family history with the patient and they are unchanged from previous note.  ALLERGIES:  is allergic to actos [pioglitazone hydrochloride]; celebrex [celecoxib]; demerol; morphine and related; ciprofloxacin; metformin; and zocor [simvastatin].  MEDICATIONS:  Current Outpatient Prescriptions  Medication Sig Dispense Refill  . acetaminophen (TYLENOL) 500 MG tablet Take 1,000 mg by mouth every 6 (six) hours as needed for mild pain or fever.    [name] albuterol (PROVENTIL HFA) 108 (90 BASE) MCG/ACT inhaler Inhale 2 puffs into the lungs every 4 (four) hours as needed for shortness of breath. Wheezing     . albuterol (PROVENTIL) (2.5 MG/3ML) 0.083% nebulizer solution USE 1 VIAL WITH NEBULIZER EVERY 4 HOURS AS NEEDED FOR WHEEZING 75 mL 2  . aspirin 81 MG tablet Take 81 mg by mouth every evening.     . cephALEXin (KEFLEX) 500 MG capsule Take 1 capsule (500 mg total) by mouth 3 (three) times daily. 20 capsule 0  . dasatinib (SPRYCEL) 70 MG tablet Take 1 tablet (70 mg total) by mouth daily. (Patient taking differently: Take 70 mg by mouth every other day. ) 30 tablet 9  . fluticasone (FLONASE) 50 MCG/ACT nasal spray Place 2 sprays into both  nostrils daily. Allergies 16 g 1  . glucose blood (ONE TOUCH TEST STRIPS) test strip Use to check blood sugars once a day Dx E11.9 100 each 3  . hydrochlorothiazide (MICROZIDE) 12.5 MG capsule Take 1 capsule (12.5 mg total) by mouth daily. 90 capsule 3  . HYDROcodone-acetaminophen (NORCO)  7.5-325 MG tablet Take 1 tablet by mouth every 6 (six) hours as needed for moderate pain.    . nitroGLYCERIN (NITROSTAT) 0.4 MG SL tablet Place 0.4 mg under the tongue every 5 (five) minutes as needed for chest pain. Reported on 12/12/2015    . Polyethyl Glycol-Propyl Glycol (SYSTANE) 0.4-0.3 % SOLN Apply 2 drops to eye 4 (four) times daily.     . pravastatin (PRAVACHOL) 20 MG tablet Take 3 tablets (60 mg total) by mouth daily. 270 tablet 3  . ranitidine (ZANTAC) 300 MG tablet Take 300 mg by mouth daily.     [name] triamcinolone cream (KENALOG) 0.1 % Apply 1 application topically 3 (three) times daily as needed (dry skin.).   3  . valsartan (DIOVAN) 160 MG tablet Take 1 tablet (160 mg total) by mouth daily. 90 tablet 3   No current facility-administered medications for this visit.     PHYSICAL EXAMINATION: ECOG PERFORMANCE STATUS: 1 - Symptomatic but completely ambulatory  Vitals:   01/28/16 1432  BP: (!) 136/48  Pulse: 87  Resp: 18  Temp: 98.1 F (36.7 C)   Filed Weights   01/28/16 1432  Weight: 229 lb 11.2 oz (104.2 kg)    GENERAL:alert, no distress and comfortable SKIN: skin color, texture, turgor are normal, no rashes or significant lesions EYES: normal, Conjunctiva are pink and non-injected, sclera clear OROPHARYNX:no exudate, no erythema and lips, buccal mucosa, and tongue normal  NECK: supple, thyroid normal size, non-tender, without nodularity LYMPH:  no palpable lymphadenopathy in the cervical, axillary or inguinal LUNGS: clear to auscultation and percussion with normal breathing effort HEART: regular rate & rhythm and no murmurs and no lower extremity edema ABDOMEN:abdomen soft, non-tender and normal bowel sounds. Noted colostomy Musculoskeletal:no cyanosis of digits and no clubbing  NEURO: alert & oriented x 3 with fluent speech, no focal motor/sensory deficits  LABORATORY DATA:  I have reviewed the data as listed    Component Value Date/Time   NA 136 01/21/2016 1114    K 4.4 01/21/2016 1114   CL 104 12/01/2015 2040   CO2 23 01/21/2016 1114   GLUCOSE 114 01/21/2016 1114   BUN 19.6 01/21/2016 1114   CREATININE 1.1 01/21/2016 1114   CALCIUM 10.4 01/21/2016 1114   PROT 7.1 01/21/2016 1114   ALBUMIN 3.9 01/21/2016 1114   AST 30 01/21/2016 1114   ALT 20 01/21/2016 1114   ALKPHOS 59 01/21/2016 1114   BILITOT 0.65 01/21/2016 1114   GFRNONAA 52 (L) 12/01/2015 2040   GFRAA >60 12/01/2015 2040    No results found for: SPEP, UPEP  Lab Results  Component Value Date   WBC 4.1 01/21/2016   NEUTROABS 2.8 01/21/2016   HGB 9.8 (L) 01/21/2016   HCT 30.1 (L) 01/21/2016   MCV 99.2 (H) 01/21/2016   PLT 126 (L) 01/21/2016      Chemistry      Component Value Date/Time   NA 136 01/21/2016 1114   K 4.4 01/21/2016 1114   CL 104 12/01/2015 2040   CO2 23 01/21/2016 1114   BUN 19.6 01/21/2016 1114   CREATININE 1.1 01/21/2016 1114   GLU 158 04/11/2014      Component Value Date/Time  CALCIUM 10.4 01/21/2016 1114   ALKPHOS 59 01/21/2016 1114   AST 30 01/21/2016 1114   ALT 20 01/21/2016 1114   BILITOT 0.65 01/21/2016 1114      ASSESSMENT & PLAN:  CML (chronic myeloid leukemia) (HCC) The patient tolerated treatment poorly with pancytopenia, diarrhea and fatigue. Even though CBC showed that his white blood cell count has normalized, BCR/ABL by PCR techniques showed it is still detectable at 3%. I will like to get it under 1% by the next time I see him back. In most cases, I would like to increase the dose 100 mg but due to his age and poor tolerance, I would keep at current dose for now.    Acquired pancytopenia [facility]) This is likely due to recent treatment. The patient denies recent history of bleeding such as epistaxis, hematuria or hematochezia. He is asymptomatic from the anemia & thrombocytopenia. I will observe for now.  He does not require transfusion now. I will continue the chemotherapy at current dose without dosage adjustment.  If the anemia gets  progressive worse in the future, I might have to delay his treatment or adjust the chemotherapy dose.   COPD mixed type [facility]) His symptoms are stable on medical management Continue conservative management. I defer treatment to his pulmonologist    Diarrhea due to drug He has significant diarrhea due to treatment. Clinically, he does not appear to be dehydrated. I recommend him to take Imodium   No orders of the defined types were placed in this encounter.  All questions were answered. The patient knows to call the clinic with any problems, questions or concerns. No barriers to learning was detected. I spent 15 minutes counseling the patient face to face. The total time spent in the appointment was 20 minutes and more than 50% was on counseling and review of test results     [facility], [place], MD 01/28/2016 3:06 PM

## 2016-01-28 NOTE — Assessment & Plan Note (Signed)
He has significant diarrhea due to treatment. Clinically, he does not appear to be dehydrated. I recommend him to take Imodium

## 2016-02-20 ENCOUNTER — Ambulatory Visit (INDEPENDENT_AMBULATORY_CARE_PROVIDER_SITE_OTHER): Payer: Medicare Other | Admitting: General Practice

## 2016-02-20 DIAGNOSIS — Z23 Encounter for immunization: Secondary | ICD-10-CM

## 2016-02-21 ENCOUNTER — Other Ambulatory Visit: Payer: Self-pay | Admitting: Internal Medicine

## 2016-02-21 ENCOUNTER — Telehealth: Payer: Self-pay | Admitting: Internal Medicine

## 2016-02-21 ENCOUNTER — Ambulatory Visit: Payer: Self-pay | Admitting: Internal Medicine

## 2016-02-21 NOTE — Telephone Encounter (Signed)
Patient Name: Eric [name] DOB: [birth year] Initial Comment Caller states his blood sugar has been 40, 50 and 60. He is so dizzy and all he wants to do is sleep. Nurse Assessment Nurse: Lavera Guise RN, Vaughan Basta Date/Time (Eastern Time): 02/21/2016 12:46:53 PM Confirm and document reason for call. If symptomatic, describe symptoms. You must click the next button to save text entered. ---Caller states his blood sugar has been 40, 50 and 60. He is so dizzy and all he wants to do is sleep. All he wants to do is sleep. Did bs 20 minutes ago is now 146. Feeling dizzy now. Ate breakfast at 1030 a.m. When he gets up to walk he gets real dizzy. Taking meds for bone marrow cancer. Has been going on for 3 or 4 days. Came on all at once. Has the patient traveled out of the country within the last 30 days? ---Not Applicable Does the patient have any new or worsening symptoms? ---Yes Will a triage be completed? ---Yes Related visit to physician within the last 2 weeks? ---Yes Does the PT have any chronic conditions? (i.e. diabetes, asthma, etc.) ---Yes List chronic conditions. ---bone cancer Is this a behavioral health or substance abuse call? ---No Guidelines Guideline Title Affirmed Question Affirmed Notes Dizziness - Lightheadedness [1] MODERATE dizziness (e.g., interferes with normal activities) AND [2] has NOT been evaluated by physician for this (Exception: dizziness caused by heat exposure, sudden standing, or poor fluid intake) Final Disposition User See Physician within [place], RN, [place] back patient to confirm his appointment with Dr. Regis Bill at [place] office at 3pm today. Referrals REFERRED TO PCP OFFICE Disagree/Comply: Comply

## 2016-02-21 NOTE — Progress Notes (Deleted)
No chief complaint on file.   HPI: Eric [name] 82 y.o.  Sent in by team health pcp at aother office   Has complex  Medical problems with CHf has pacer and icd vascular disease and   CMl under rx      Got a flu vaccine yersetday but co fo dizziness for  ROS: See pertinent positives and negatives per HPI.  Past Medical History:  Diagnosis Date  . ALLERGIC RHINITIS   . ANEMIA-NOS   . AORTIC SCLEROSIS   . Asthma   . CARDIOMYOPATHY, ISCHEMIC   . CAROTID BRUIT, RIGHT 02/27/2008  . Cataract    surgery  . CML (chronic myeloid leukemia) ([place]) 06/26/2015  . COPD   . CORONARY ARTERY DISEASE    CABG 1995, PTCA/DES 2008, 2009 and 08/2010  . DIABETES MELLITUS-TYPE II    diet controlled  . Diastolic dysfunction, Grade 1 11/24/2014  . Diverticulitis of colon with perforation 11/22/2014  . Diverticulosis   . GERD   . HIATAL HERNIA   . Hx of echocardiogram    Echo (9/15):  Mild LVH, EF 50-55%, no RWMA, Gr 1 DD, MAC, mild LAE.  [name] HYPERLIPIDEMIA   . HYPERTENSION   . Hyponatremia 11/22/2014  . IBS (irritable bowel syndrome)   . LACTOSE INTOLERANCE   . OA (osteoarthritis)   . OBESITY   . Partial small bowel obstruction ([place])   . PERIPHERAL VASCULAR DISEASE   . Primary hyperparathyroidism ([place])    Lab Results Component Value Date  PTH 150.7* 02/13/2013  CALCIUM 11.0* 02/13/2013  CAION 1.21 03/15/2008    . Prostate cancer ([place])    seed implants 2004  . SICK SINUS/ TACHY-BRADY SYNDROME 09/2007   s/p PPM st judes  . Sleep apnea   . SMALL BOWEL OBSTRUCTION 04/18/2009   Qualifier: History of  By: Asa Lente MD, Jannifer Rodney Symptomatic diverticulosis 01/18/2009   Qualifier: Diagnosis of  By: Shane Crutch, Amy S     Family History  Problem Relation Age of Onset  . Hypertension Mother   . Cancer Mother   . Heart attack    . Heart attack    . Heart attack Brother   . Colon cancer Neg Hx   . Stroke Neg Hx     Social History   Social History  . Marital status: Widowed    Spouse name:  N/A  . Number of children: N/A  . Years of education: N/A   Occupational History  . retired Retired   Social History Main Topics  . Smoking status: Former Smoker    Packs/day: 1.00    Years: 25.00    Types: Cigarettes    Quit date: 06/08/1994  . Smokeless tobacco: Never Used  . Alcohol use No  . Drug use: No  . 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 Not on file   Social History Narrative  . No narrative on file    Outpatient Medications Prior to Visit  Medication Sig Dispense Refill  . acetaminophen (TYLENOL) 500 MG tablet Take 1,000 mg by mouth every 6 (six) hours as needed for mild pain or fever.    [name] albuterol (PROVENTIL HFA) 108 (90 BASE) MCG/ACT inhaler Inhale 2 puffs into the lungs every 4 (four) hours as needed for shortness of breath. Wheezing     . albuterol (PROVENTIL) (2.5 MG/3ML) 0.083% nebulizer solution USE  1 VIAL WITH NEBULIZER EVERY 4 HOURS AS NEEDED FOR WHEEZING 75 mL 2  . aspirin 81 MG tablet Take 81 mg by mouth every evening.     . cephALEXin (KEFLEX) 500 MG capsule Take 1 capsule (500 mg total) by mouth 3 (three) times daily. 20 capsule 0  . dasatinib (SPRYCEL) 70 MG tablet Take 1 tablet (70 mg total) by mouth daily. (Patient taking differently: Take 70 mg by mouth every other day. ) 30 tablet 9  . fluticasone (FLONASE) 50 MCG/ACT nasal spray Place 2 sprays into both nostrils daily. Allergies 16 g 1  . glucose blood (ONE TOUCH TEST STRIPS) test strip Use to check blood sugars once a day Dx E11.9 100 each 3  . hydrochlorothiazide (MICROZIDE) 12.5 MG capsule Take 1 capsule (12.5 mg total) by mouth daily. 90 capsule 3  . HYDROcodone-acetaminophen (NORCO) 7.5-325 MG tablet Take 1 tablet by mouth every 6 (six) hours as needed for moderate pain.    . nitroGLYCERIN (NITROSTAT) 0.4 MG SL tablet Place 0.4 mg under the tongue every 5 (five) minutes as needed for chest pain.  Reported on 12/12/2015    . Polyethyl Glycol-Propyl Glycol (SYSTANE) 0.4-0.3 % SOLN Apply 2 drops to eye 4 (four) times daily.     . pravastatin (PRAVACHOL) 20 MG tablet Take 3 tablets (60 mg total) by mouth daily. 270 tablet 3  . ranitidine (ZANTAC) 300 MG tablet Take 300 mg by mouth daily.     [name] triamcinolone cream (KENALOG) 0.1 % Apply 1 application topically 3 (three) times daily as needed (dry skin.).   3  . valsartan (DIOVAN) 160 MG tablet Take 1 tablet (160 mg total) by mouth daily. 90 tablet 3   No facility-administered medications prior to visit.      EXAM:  There were no vitals taken for this visit.  There is no height or weight on file to calculate BMI.  GENERAL: vitals reviewed and listed above, alert, oriented, appears well hydrated and in no acute distress HEENT: atraumatic, conjunctiva  clear, no obvious abnormalities on inspection of external nose and ears OP : no lesion edema or exudate  NECK: no obvious masses on inspection palpation  LUNGS: clear to auscultation bilaterally, no wheezes, rales or rhonchi, good air movement CV: HRRR, no clubbing cyanosis or  peripheral edema nl cap refill  MS: moves all extremities without noticeable focal  abnormality PSYCH: pleasant and cooperative, no obvious depression or anxiety  ASSESSMENT AND PLAN:  Discussed the following assessment and plan:  No diagnosis found.  -Patient advised to return or notify health care team  if symptoms worsen ,persist or new concerns arise.  There are no Patient Instructions on file for this visit.   Standley Brooking. Brayley Mackowiak M.D.

## 2016-02-24 ENCOUNTER — Ambulatory Visit (INDEPENDENT_AMBULATORY_CARE_PROVIDER_SITE_OTHER): Payer: Medicare Other | Admitting: *Deleted

## 2016-02-24 ENCOUNTER — Ambulatory Visit: Payer: Medicare Other | Admitting: Internal Medicine

## 2016-02-24 DIAGNOSIS — Z950 Presence of cardiac pacemaker: Secondary | ICD-10-CM

## 2016-02-24 DIAGNOSIS — I495 Sick sinus syndrome: Secondary | ICD-10-CM | POA: Diagnosis not present

## 2016-02-24 LAB — CUP PACEART INCLINIC DEVICE CHECK
Battery Impedance: 1500 Ohm
Implantable Lead Implant Date: 20090414
Lead Channel Impedance Value: 524 Ohm
Lead Channel Pacing Threshold Pulse Width: 0.4 ms
Lead Channel Sensing Intrinsic Amplitude: 6 mV
Lead Channel Setting Pacing Amplitude: 2 V
Lead Channel Setting Sensing Sensitivity: 2 mV
MDC IDC LEAD IMPLANT DT: 20090414
MDC IDC LEAD LOCATION: 753859
MDC IDC LEAD LOCATION: 753860
MDC IDC MSMT BATTERY VOLTAGE: 2.76 V
MDC IDC MSMT LEADCHNL RA IMPEDANCE VALUE: 354 Ohm
MDC IDC MSMT LEADCHNL RA PACING THRESHOLD AMPLITUDE: 0.75 V
MDC IDC MSMT LEADCHNL RA PACING THRESHOLD PULSEWIDTH: 0.4 ms
MDC IDC MSMT LEADCHNL RA SENSING INTR AMPL: 2.4 mV
MDC IDC MSMT LEADCHNL RV PACING THRESHOLD AMPLITUDE: 0.875 V
MDC IDC SESS DTM: 20170918155844
MDC IDC SET LEADCHNL RV PACING PULSEWIDTH: 0.4 ms
MDC IDC STAT BRADY RA PERCENT PACED: 12 %
MDC IDC STAT BRADY RV PERCENT PACED: 1 % — AB
Pulse Gen Serial Number: 2094876

## 2016-02-24 NOTE — Patient Instructions (Signed)
Your physician wants you to follow-up in: 6 months with Dr. Klein. You will receive a reminder letter in the mail two months in advance. If you don't receive a letter, please call our office to schedule the follow-up appointment.  

## 2016-02-24 NOTE — Progress Notes (Signed)
Pacemaker check in clinic. Normal device function. Thresholds and impedances consistent with previous measurements. P- and R-waves trending down over time, stable at present. Device programmed to maximize longevity. 1 mode switch (<1%), duration 6 sec, peak A 154bpm. No episode triggers enabled. Device programmed at appropriate safety margins. Histogram distribution appropriate for patient activity level. Device programmed to optimize intrinsic conduction. Estimated longevity 4.5-7.25 years. Patient education completed. ROV with SK in 6 months.

## 2016-02-25 ENCOUNTER — Ambulatory Visit (INDEPENDENT_AMBULATORY_CARE_PROVIDER_SITE_OTHER): Payer: Medicare Other | Admitting: Internal Medicine

## 2016-02-25 ENCOUNTER — Encounter: Payer: Self-pay | Admitting: Internal Medicine

## 2016-02-25 DIAGNOSIS — I6523 Occlusion and stenosis of bilateral carotid arteries: Secondary | ICD-10-CM

## 2016-02-25 DIAGNOSIS — R42 Dizziness and giddiness: Secondary | ICD-10-CM

## 2016-02-25 NOTE — Progress Notes (Signed)
Pre visit review using our clinic review tool, if applicable. No additional management support is needed unless otherwise documented below in the visit note. 

## 2016-02-25 NOTE — Progress Notes (Signed)
   Subjective:    Patient ID: Eric [name], male    DOB: 01-May-1927, 82 y.o.   MRN: [id number]  HPI The patient is an 82 YO man coming in for dizziness with standing. He has been having it for about 1 week now. He is also feeling weak and tired. No other signs of infection such as cough, SOB, chest pains, fevers or chills. He is worried that it is coming from the sprycel but he is not able to stop that at this time. He denies change to diet or exercise. No change to his medications. Pacemaker check recently with normal function.   Review of Systems  Constitutional: Positive for fatigue. Negative for activity change, appetite change, chills, fever and unexpected weight change.  HENT: Negative for congestion, postnasal drip, rhinorrhea and sinus pressure.   Eyes: Negative.   Respiratory: Negative for cough, chest tightness, shortness of breath and wheezing.        Improving  Cardiovascular: Negative for chest pain, palpitations and leg swelling.  Gastrointestinal: Negative for abdominal distention, abdominal pain, constipation, diarrhea, nausea and vomiting.  Musculoskeletal: Positive for arthralgias, back pain and gait problem. Negative for joint swelling and myalgias.  Neurological: Positive for dizziness, weakness and light-headedness. Negative for seizures, numbness and headaches.      Objective:   Physical Exam  Constitutional: He is oriented to person, place, and time. He appears well-developed and well-nourished.  HENT:  Head: Normocephalic and atraumatic.  Right Ear: External ear normal.  Left Ear: External ear normal.  Mouth/Throat: Oropharynx is clear and moist.  Eyes: EOM are normal.  Neck: Normal range of motion.  Cardiovascular: Normal rate and regular rhythm.   Pulmonary/Chest: Effort normal. No respiratory distress. He has no wheezes.  Abdominal: Soft. He exhibits no distension. There is no tenderness. There is no rebound.  Musculoskeletal: He exhibits no edema.    Neurological: He is alert and oriented to person, place, and time. Coordination abnormal.  Orthostatics negative in the office but has dizziness with standing. Uses cane for ambulation.   Skin: Skin is warm and dry.  Psychiatric: He has a normal mood and affect.   Vitals:   02/25/16 1610  BP: (!) 140/52  Pulse: 77  Temp: 98.1 F (36.7 C)  TempSrc: Oral  SpO2: 92%  Weight: 215 lb (97.5 kg)  Height: 5\' 6"  (1.676 m)      Assessment & Plan:

## 2016-02-25 NOTE — Assessment & Plan Note (Signed)
Will decrease the valartan to 1/2 pill daily. He will maintain the 12.5 mg hctz still. If he does not get relief with this concern that it could be related to his medications. He will talk with his oncologist about this again.

## 2016-02-25 NOTE — Patient Instructions (Signed)
We would like you to keep taking 12.5 mg of hctz (hydrochlorothiazide) and start taking 1/2 pill (80 mg) of the valsartan instead of a whole pill.  This will help the blood pressure not drop as much when you are standing to see if this helps with the symptoms.   If not we may need to talk with Dr. Alvy Bimler to see if something else can be used for the cancer if the sprycel is the cause of the dizziness.

## 2016-03-02 ENCOUNTER — Encounter: Payer: Self-pay | Admitting: Internal Medicine

## 2016-03-02 DIAGNOSIS — I495 Sick sinus syndrome: Secondary | ICD-10-CM | POA: Diagnosis not present

## 2016-03-02 DIAGNOSIS — Z950 Presence of cardiac pacemaker: Secondary | ICD-10-CM | POA: Diagnosis not present

## 2016-03-03 DIAGNOSIS — M47816 Spondylosis without myelopathy or radiculopathy, lumbar region: Secondary | ICD-10-CM | POA: Diagnosis not present

## 2016-03-08 ENCOUNTER — Encounter: Payer: Self-pay | Admitting: Student

## 2016-03-16 ENCOUNTER — Ambulatory Visit (INDEPENDENT_AMBULATORY_CARE_PROVIDER_SITE_OTHER): Payer: Medicare Other | Admitting: Internal Medicine

## 2016-03-16 ENCOUNTER — Encounter: Payer: Self-pay | Admitting: Internal Medicine

## 2016-03-16 DIAGNOSIS — G4733 Obstructive sleep apnea (adult) (pediatric): Secondary | ICD-10-CM

## 2016-03-16 DIAGNOSIS — J449 Chronic obstructive pulmonary disease, unspecified: Secondary | ICD-10-CM | POA: Diagnosis not present

## 2016-03-16 DIAGNOSIS — I6523 Occlusion and stenosis of bilateral carotid arteries: Secondary | ICD-10-CM

## 2016-03-16 NOTE — Patient Instructions (Addendum)
Ok to continue CPAP 12 through [facility]  I'm glad you are doing so well. [facility]  We will be happy to see you back here if you need [place].

## 2016-03-16 NOTE — Progress Notes (Signed)
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 No problems offered at this visit. CXR 07/27/2014-COPD/chronic bronchitis. NAD  03/16/2016-82 year old male former smoker,Veteran, followed for asthma/COPD, OSA, complicated by DM, obesity, CAD/ischemic CM/sick sinus/bradycardia tachycardia/pacemaker, history of prostate cancer, anemia CPAP 12/[facility] FOLLOWS FOR: Pt states he is doing well and gets meds, CPAP needs from [facility] in [place].  He is pleased with the care he is getting now through the [facility] in [place]. They provided new CPAP machine still at pressure 12. He likes Combivent Respimat inhaler used up to 4 times daily. Feels breathing is well controlled now. Oxygen was discontinued. CXR 12/01/2015-NAD, CABG, pacemaker L  ROS-see HPI Constitutional:   No-   weight loss, night sweats, fevers, chills, fatigue, lassitude. HEENT:   No-  headaches, difficulty swallowing, tooth/dental problems, sore throat,       No-  sneezing, itching, ear ache, nasal congestion, post nasal drip,  CV:  No- recent  chest pain, orthopnea, PND. +Mild chronic swelling in lower extremities,                                              No- anasarca, dizziness, palpitations Resp: +  shortness of breath with exertion or at rest.               productive cough,   non-productive cough,  No- coughing up of blood.               change in color of mucus.  No- wheezing.   Skin: No-   rash or lesions. GI:  No-   heartburn, indigestion,  abdominal pain, nausea, vomiting,  GU:  MS: . Limiting hip pain Neuro-     nothing unusual Psych:  No- change in mood or affect. No depression or anxiety.  No memory loss.  OBJ General- Alert, Oriented, Affect-appropriate, Distress- none acute, +overweight,  Skin- rash-none, lesions- none, excoriation- none Lymphadenopathy- none Head- atraumatic            Eyes- Gross vision intact, PERRLA, conjunctivae clear secretions            Ears- Hearing aids, HOH            Nose- Clear, no-Septal dev, mucus, polyps, erosion, perforation             Throat- Mallampati II , mucosa clear , drainage- none, tonsils- atrophic, +dentures                     Neck- flexible , trachea midline, no stridor , thyroid nl, carotid no bruit Chest - symmetrical excursion , unlabored           Heart/CV- RRR +bigeminal pulse , no murmur , no gallop  , no rub, nl s1 s2                           - JVD- none , edema- none, stasis changes- none, varices- none  Lung- clear, unlabored, wheeze- none, cough +loose,   dullness-none, rub-  none. .           Chest wall- + pacemaker left chest Abd- Br/ Gen/ Rectal- Not done, not indicated Extrem- cyanosis- none, clubbing, none, atrophy- none, strength- deconditioned, +cane Neuro- grossly intact to observation

## 2016-03-17 NOTE — Assessment & Plan Note (Signed)
Oxygenation is improved and he feels well controlled. [facility] is providing Comb[facility] Respimat which she is using appropriately as a rescue inhaler.

## 2016-03-17 NOTE — Assessment & Plan Note (Signed)
He is managed now through the [facility], describing great compliance and control with pressure 12. He sleeps better quality of life is improved.

## 2016-03-19 ENCOUNTER — Telehealth: Payer: Self-pay | Admitting: Emergency Medicine

## 2016-03-19 NOTE — Telephone Encounter (Signed)
error 

## 2016-03-23 ENCOUNTER — Ambulatory Visit (INDEPENDENT_AMBULATORY_CARE_PROVIDER_SITE_OTHER): Payer: Medicare Other | Admitting: Internal Medicine

## 2016-03-23 ENCOUNTER — Encounter: Payer: Self-pay | Admitting: Internal Medicine

## 2016-03-23 DIAGNOSIS — I10 Essential (primary) hypertension: Secondary | ICD-10-CM | POA: Diagnosis not present

## 2016-03-23 DIAGNOSIS — R42 Dizziness and giddiness: Secondary | ICD-10-CM

## 2016-03-23 DIAGNOSIS — I6523 Occlusion and stenosis of bilateral carotid arteries: Secondary | ICD-10-CM | POA: Diagnosis not present

## 2016-03-23 NOTE — Progress Notes (Signed)
Pre visit review using our clinic review tool, if applicable. No additional management support is needed unless otherwise documented below in the visit note. 

## 2016-03-23 NOTE — Patient Instructions (Signed)
We will have you take the whole pill of the valsartan again.   It is okay to take the two blood pressure medicines at different times.   Ask Dr. Alvy Bimler if the sprycel could be causing the dizziness and if there are other options.

## 2016-03-23 NOTE — Progress Notes (Signed)
   Subjective:    Patient ID: Eric [name], male    DOB: 12/13/1926, 82 y.o.   MRN: [id number]  HPI The patient is an 82 YO man coming in for persistent dizziness. Starts in the morning and gone by 2-3 PM. He has changed his valsartan to 1/2 pill daily per our advice last visit. This has not changed his symptoms at all. His blood pressure is running high now. Most checks at home 150-160s/70s. He has not changed anything. Asked him about low blood sugar and he denies this. Symptoms are not alleviated by eating food. He is still suspicious that the sprycel is causing the symptoms but has not talked to his oncologist about it. He has not fallen but several near misses. He is getting a walker through the [place] and also some home care services when he goes for evaluation in the next week or two. He describes the dizziness as a mix of lightheadedness and vertigo.   Review of Systems  Constitutional: Positive for activity change and fatigue. Negative for appetite change, fever and unexpected weight change.  HENT: Negative.   Eyes: Negative.   Respiratory: Positive for cough and shortness of breath. Negative for chest tightness and wheezing.   Cardiovascular: Negative for chest pain, palpitations and leg swelling.  Gastrointestinal: Negative for abdominal distention, abdominal pain, constipation and diarrhea.  Musculoskeletal: Positive for arthralgias, gait problem and myalgias.  Skin: Negative.   Neurological: Positive for dizziness and weakness. Negative for light-headedness.      Objective:   Physical Exam  Constitutional: He appears well-developed and well-nourished.  HENT:  Head: Normocephalic and atraumatic.  Eyes: EOM are normal.  Neck: Normal range of motion.  Cardiovascular: Normal rate and regular rhythm.   Pulmonary/Chest: Effort normal. No respiratory distress. He has wheezes. He has no rales.  Some mild expiratory wheeze which is stable.   Abdominal: Soft. He exhibits no  distension. There is no tenderness. There is no rebound.  Musculoskeletal: He exhibits no edema.  Neurological: Coordination abnormal.  Uses cane and gait slow and unsteady.   Skin: Skin is warm and dry.   Vitals:   03/23/16 1531 03/24/16 0914  BP: (!) 170/78 (!) 158/64  Pulse: 60   Resp: 18   Temp: 98 F (36.7 C)   TempSrc: Oral   SpO2: 97%   Weight: 228 lb (103.4 kg)   Height: 5\' 6"  (1.676 m)       Assessment & Plan:

## 2016-03-24 NOTE — Assessment & Plan Note (Signed)
Unfortunately changing his medication for blood pressure did not affect his dizziness symptoms. He will ask his oncologist about the sprycel. It is listed as a side effect but I am unclear if it is related. Blood sugars are not low during episodes.

## 2016-03-24 NOTE — Assessment & Plan Note (Signed)
Change valsartan back to 1 pill daily, and continue hctz 12.5 mg daily for some high BP with change and no change in symptoms.

## 2016-03-26 ENCOUNTER — Other Ambulatory Visit (HOSPITAL_BASED_OUTPATIENT_CLINIC_OR_DEPARTMENT_OTHER): Payer: Medicare Other

## 2016-03-26 DIAGNOSIS — C9210 Chronic myeloid leukemia, BCR/ABL-positive, not having achieved remission: Secondary | ICD-10-CM | POA: Diagnosis not present

## 2016-03-26 LAB — COMPREHENSIVE METABOLIC PANEL WITH GFR
ALT: 13 U/L (ref 0–55)
AST: 21 U/L (ref 5–34)
Albumin: 3.7 g/dL (ref 3.5–5.0)
Alkaline Phosphatase: 81 U/L (ref 40–150)
Anion Gap: 9 meq/L (ref 3–11)
BUN: 18.3 mg/dL (ref 7.0–26.0)
CO2: 21 meq/L — ABNORMAL LOW (ref 22–29)
Calcium: 10.8 mg/dL — ABNORMAL HIGH (ref 8.4–10.4)
Chloride: 102 meq/L (ref 98–109)
Creatinine: 1.1 mg/dL (ref 0.7–1.3)
EGFR: 62 ml/min/1.73 m2 — ABNORMAL LOW
Glucose: 134 mg/dL (ref 70–140)
Potassium: 4 meq/L (ref 3.5–5.1)
Sodium: 133 meq/L — ABNORMAL LOW (ref 136–145)
Total Bilirubin: 0.58 mg/dL (ref 0.20–1.20)
Total Protein: 7.2 g/dL (ref 6.4–8.3)

## 2016-03-26 LAB — CBC WITH DIFFERENTIAL/PLATELET
BASO%: 0 % (ref 0.0–2.0)
Basophils Absolute: 0 10*3/uL (ref 0.0–0.1)
EOS%: 2.9 % (ref 0.0–7.0)
Eosinophils Absolute: 0.1 10*3/uL (ref 0.0–0.5)
HCT: 29.7 % — ABNORMAL LOW (ref 38.4–49.9)
HGB: 10 g/dL — ABNORMAL LOW (ref 13.0–17.1)
LYMPH%: 10.5 % — ABNORMAL LOW (ref 14.0–49.0)
MCH: 32.6 pg (ref 27.2–33.4)
MCHC: 33.7 g/dL (ref 32.0–36.0)
MCV: 96.7 fL (ref 79.3–98.0)
MONO#: 0.5 10*3/uL (ref 0.1–0.9)
MONO%: 14.6 % — ABNORMAL HIGH (ref 0.0–14.0)
NEUT#: 2.3 10*3/uL (ref 1.5–6.5)
NEUT%: 72 % (ref 39.0–75.0)
Platelets: 133 10*3/uL — ABNORMAL LOW (ref 140–400)
RBC: 3.07 10*6/uL — ABNORMAL LOW (ref 4.20–5.82)
RDW: 14.3 % (ref 11.0–14.6)
WBC: 3.2 10*3/uL — ABNORMAL LOW (ref 4.0–10.3)
lymph#: 0.3 10*3/uL — ABNORMAL LOW (ref 0.9–3.3)

## 2016-03-30 ENCOUNTER — Telehealth: Payer: Self-pay | Admitting: Emergency Medicine

## 2016-03-30 DIAGNOSIS — C9210 Chronic myeloid leukemia, BCR/ABL-positive, not having achieved remission: Secondary | ICD-10-CM

## 2016-03-30 NOTE — Telephone Encounter (Signed)
Pt called and asked that you send [facility] back out. He turned them down once before but thinks he needs them now. He is also asking if he can get a 4 wheel walker from [place]. Please advise thanks.

## 2016-04-02 ENCOUNTER — Telehealth: Payer: Self-pay | Admitting: Hematology and Oncology

## 2016-04-02 ENCOUNTER — Ambulatory Visit (HOSPITAL_BASED_OUTPATIENT_CLINIC_OR_DEPARTMENT_OTHER): Payer: Medicare Other | Admitting: Hematology and Oncology

## 2016-04-02 ENCOUNTER — Encounter: Payer: Self-pay | Admitting: Hematology and Oncology

## 2016-04-02 VITALS — BP 142/50 | HR 78 | Temp 98.0°F | Resp 18 | Ht 66.0 in | Wt 224.5 lb

## 2016-04-02 DIAGNOSIS — K521 Toxic gastroenteritis and colitis: Secondary | ICD-10-CM

## 2016-04-02 DIAGNOSIS — C9210 Chronic myeloid leukemia, BCR/ABL-positive, not having achieved remission: Secondary | ICD-10-CM

## 2016-04-02 DIAGNOSIS — J449 Chronic obstructive pulmonary disease, unspecified: Secondary | ICD-10-CM | POA: Diagnosis not present

## 2016-04-02 DIAGNOSIS — D61818 Other pancytopenia: Secondary | ICD-10-CM | POA: Diagnosis not present

## 2016-04-02 NOTE — Assessment & Plan Note (Signed)
This is likely due to recent treatment. The patient denies recent history of bleeding such as epistaxis, hematuria or hematochezia. He is asymptomatic from the anemia & thrombocytopenia. I will observe for now.  He does not require transfusion now. I will continue the chemotherapy at current dose without dosage adjustment.  If the anemia gets progressive worse in the future, I might have to delay his treatment or adjust the chemotherapy dose.

## 2016-04-02 NOTE — Telephone Encounter (Signed)
AVS report and appointment schedule given to patient, per 04/02/16 los. °

## 2016-04-02 NOTE — Assessment & Plan Note (Signed)
He has significant diarrhea due to treatment. Clinically, he does not appear to be dehydrated. I recommend him to take Imodium

## 2016-04-02 NOTE — Progress Notes (Signed)
[facility] OFFICE PROGRESS NOTE  Patient Care Team: [name], MD as PCP - General (Internal Medicine) Belva Crome, MD as Consulting Physician (Cardiology) Suella Broad, MD as Consulting Physician (Physical Medicine and Rehabilitation) Deboraha Sprang, MD as Consulting Physician (Cardiology) Gatha Mayer, MD as Consulting Physician (Gastroenterology) Irine Seal, MD as Consulting Physician (Urology) Melida Quitter, MD as Consulting Physician (Otolaryngology) Susa Day, MD (Orthopedic Surgery) Deneise Lever, MD (Pulmonary Disease) Carol Ada, MD (Gastroenterology) Allyn Kenner, MD (Dermatology)  SUMMARY OF ONCOLOGIC HISTORY:   CML (chronic myeloid leukemia) ([facility])   06/19/2015 Pathology Results    Peripheral blood was positive for BCR/ABL at 45.49% a1a2 and on IS 37.76%      06/20/2015 Bone Marrow Biopsy    Accession: [id number] BM biopsy confirmed CML. 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 Results of the tumor marker test revealed:Peripheral blood was positive for BCR/ABL at 3.08% a1a2 and on IS 2.55%      03/26/2016 Pathology Results    Peripheral blood was positive for BCR/ABL at 0.78% a1a2 and on IS 0.6474%       INTERVAL HISTORY: Please see below for problem oriented charting. He returns to review test results. He is miserable with frequent diarrhea, sometimes up to 6 or 7 times a day. He refuses using Imodium because it had caused constipation in the past. He continued to have mild chronic cough with shortness of breath on moderate exertion. Denies  lower extremity swelling. Denies recent infection. He is very concerned about elevated blood sugar recently and has appointment to see his physician at [facility] for further management. He is compliant taking Sprycel as directed  REVIEW OF SYSTEMS:   Constitutional: Denies fevers, chills or abnormal weight loss Eyes: Denies blurriness of vision Ears, nose, mouth, throat, and face: Denies mucositis or sore throat Cardiovascular: Denies palpitation, chest discomfort or lower extremity swelling Skin: Denies abnormal skin rashes Lymphatics: Denies new lymphadenopathy or easy bruising Neurological:Denies numbness, tingling or new weaknesses Behavioral/Psych: Mood is stable, no new changes  All other systems were reviewed with the patient and are negative.  I have reviewed the past medical history, past surgical history, social history and family history with the patient and they are unchanged from previous note.  ALLERGIES:  is allergic to actos [pioglitazone hydrochloride]; celebrex [celecoxib]; demerol; morphine and related; ciprofloxacin; metformin; and zocor [simvastatin].  MEDICATIONS:  Current Outpatient Prescriptions  Medication Sig Dispense Refill  . acetaminophen (TYLENOL) 500 MG tablet Take 1,000 mg by mouth every 6 (six) hours as needed for mild pain or fever.    [name] albuterol (PROVENTIL HFA) 108 (90 BASE) MCG/ACT inhaler Inhale 2 puffs into the lungs every 4 (four) hours as needed for shortness of breath. Wheezing     . albuterol (PROVENTIL) (2.5 MG/3ML) 0.083% nebulizer solution USE 1 VIAL WITH NEBULIZER EVERY 4 HOURS AS NEEDED FOR WHEEZING 75 mL 2  . albuterol-ipratropium (COMBIVENT) 18-103 MCG/ACT inhaler Inhale 1 puff into the  lungs every 6 (six) hours as needed for wheezing or shortness of breath.    [name] aspirin 81 MG tablet Take 81 mg by mouth every evening.     . dasatinib (SPRYCEL) 70 MG tablet Take 1 tablet (70 mg total) by mouth daily. 30 tablet 9  . fluticasone (FLONASE) 50  MCG/ACT nasal spray Place 2 sprays into both nostrils daily. Allergies 16 g 1  . hydrochlorothiazide (MICROZIDE) 12.5 MG capsule Take 1 capsule (12.5 mg total) by mouth daily. 90 capsule 3  . HYDROcodone-acetaminophen (NORCO) 7.5-325 MG tablet Take 1 tablet by mouth every 6 (six) hours as needed for moderate pain.    . nitroGLYCERIN (NITROSTAT) 0.4 MG SL tablet Place 0.4 mg under the tongue every 5 (five) minutes as needed for chest pain. Reported on 12/12/2015    . ONE TOUCH ULTRA TEST test strip USE TO CHECK BLOOD SUGARS ONCE A DAY 100 each 3  . Polyethyl Glycol-Propyl Glycol (SYSTANE) 0.4-0.3 % SOLN Apply 2 drops to eye 4 (four) times daily.     . pravastatin (PRAVACHOL) 20 MG tablet Take 3 tablets (60 mg total) by mouth daily. 270 tablet 3  . ranitidine (ZANTAC) 300 MG tablet Take 300 mg by mouth daily.     [name] triamcinolone cream (KENALOG) 0.1 % Apply 1 application topically 3 (three) times daily as needed (dry skin.).   3  . valsartan (DIOVAN) 160 MG tablet Take 1 tablet (160 mg total) by mouth daily. 90 tablet 3   No current facility-administered medications for this visit.     PHYSICAL EXAMINATION: ECOG PERFORMANCE STATUS: 1 - Symptomatic but completely ambulatory  Vitals:   04/02/16 1511  BP: (!) 142/50  Pulse: 78  Resp: 18  Temp: 98 F (36.7 C)   Filed Weights   04/02/16 1511  Weight: 224 lb 8 oz (101.8 kg)    GENERAL:alert, no distress and comfortable. He is moderately obese SKIN: skin color, texture, turgor are normal, no rashes or significant lesions EYES: normal, Conjunctiva are pink and non-injected, sclera clear OROPHARYNX:no exudate, no erythema and lips, buccal mucosa, and tongue normal  NECK: supple, thyroid normal size, non-tender, without nodularity LYMPH:  no palpable lymphadenopathy in the cervical, axillary or inguinal LUNGS: Scattered bilateral wheezes with normal breathing effort HEART: regular rate & rhythm and no murmurs and no lower extremity  edema ABDOMEN:abdomen soft, non-tender and normal bowel sounds Musculoskeletal:no cyanosis of digits and no clubbing  NEURO: alert & oriented x 3 with fluent speech, no focal motor/sensory deficits  LABORATORY DATA:  I have reviewed the data as listed    Component Value Date/Time   NA 133 (L) 03/26/2016 1333   K 4.0 03/26/2016 1333   CL 104 12/01/2015 2040   CO2 21 (L) 03/26/2016 1333   GLUCOSE 134 03/26/2016 1333   BUN 18.3 03/26/2016 1333   CREATININE 1.1 03/26/2016 1333   CALCIUM 10.8 (H) 03/26/2016 1333   PROT 7.2 03/26/2016 1333   ALBUMIN 3.7 03/26/2016 1333   AST 21 03/26/2016 1333   ALT 13 03/26/2016 1333   ALKPHOS 81 03/26/2016 1333   BILITOT 0.58 03/26/2016 1333   GFRNONAA 52 (L) 12/01/2015 2040   GFRAA >60 12/01/2015 2040    No results found for: SPEP, UPEP  Lab Results  Component Value Date   WBC 3.2 (L) 03/26/2016   NEUTROABS 2.3 03/26/2016   HGB 10.0 (L) 03/26/2016   HCT 29.7 (L) 03/26/2016   MCV 96.7 03/26/2016   PLT 133 (L) 03/26/2016  Chemistry      Component Value Date/Time   NA 133 (L) 03/26/2016 1333   K 4.0 03/26/2016 1333   CL 104 12/01/2015 2040   CO2 21 (L) 03/26/2016 1333   BUN 18.3 03/26/2016 1333   CREATININE 1.1 03/26/2016 1333   GLU 158 04/11/2014      Component Value Date/Time   CALCIUM 10.8 (H) 03/26/2016 1333   ALKPHOS 81 03/26/2016 1333   AST 21 03/26/2016 1333   ALT 13 03/26/2016 1333   BILITOT 0.58 03/26/2016 1333      ASSESSMENT & PLAN:  CML (chronic myeloid leukemia) ([facility]) The patient tolerated treatment poorly with pancytopenia, diarrhea and fatigue. Even though CBC showed that his white blood cell count has normalized, BCR/ABL by PCR techniques showed it is still detectable at slightly under 1%. I will like to get it to MMR by the next time I see him back. In most cases, I would like to increase the dose 100 mg but due to his age and poor tolerance, I would keep at current dose for now.  Acquired pancytopenia  [facility]) This is likely due to recent treatment. The patient denies recent history of bleeding such as epistaxis, hematuria or hematochezia. He is asymptomatic from the anemia & thrombocytopenia. I will observe for now.  He does not require transfusion now. I will continue the chemotherapy at current dose without dosage adjustment.  If the anemia gets progressive worse in the future, I might have to delay his treatment or adjust the chemotherapy dose.   Diarrhea due to drug He has significant diarrhea due to treatment. Clinically, he does not appear to be dehydrated. I recommend him to take Imodium  COPD mixed type [facility]) His symptoms are stable on medical management Continue conservative management. I defer treatment to his pulmonologist He does not have clinical pleural effusion    Orders Placed This Encounter  Procedures  . CBC with Differential/Platelet    Standing Status:   Future    Standing Expiration Date:   05/07/2017  . Comprehensive metabolic panel    Standing Status:   Future    Standing Expiration Date:   05/07/2017  . BCR-ABL    With RT-PCR technique    Standing Status:   Future    Standing Expiration Date:   05/07/2017   All questions were answered. The patient knows to call the clinic with any problems, questions or concerns. No barriers to learning was detected. I spent 15 minutes counseling the patient face to face. The total time spent in the appointment was 20 minutes and more than 50% was on counseling and review of test results     Heath Lark, MD 04/02/2016 3:39 PM

## 2016-04-02 NOTE — Assessment & Plan Note (Signed)
The patient tolerated treatment poorly with pancytopenia, diarrhea and fatigue. Even though CBC showed that his white blood cell count has normalized, BCR/ABL by PCR techniques showed it is still detectable at slightly under 1%. I will like to get it to MMR by the next time I see him back. In most cases, I would like to increase the dose 100 mg but due to his age and poor tolerance, I would keep at current dose for now. 

## 2016-04-02 NOTE — Telephone Encounter (Signed)
Rx for wheeled walker given to [name] and [facility] for [facility] done and he should hear about that.

## 2016-04-02 NOTE — Assessment & Plan Note (Signed)
His symptoms are stable on medical management Continue conservative management. I defer treatment to his pulmonologist He does not have clinical pleural effusion

## 2016-04-03 DIAGNOSIS — H01004 Unspecified blepharitis left upper eyelid: Secondary | ICD-10-CM | POA: Diagnosis not present

## 2016-04-03 DIAGNOSIS — H02055 Trichiasis without entropian left lower eyelid: Secondary | ICD-10-CM | POA: Diagnosis not present

## 2016-04-03 DIAGNOSIS — H02054 Trichiasis without entropian left upper eyelid: Secondary | ICD-10-CM | POA: Diagnosis not present

## 2016-04-03 DIAGNOSIS — H02051 Trichiasis without entropian right upper eyelid: Secondary | ICD-10-CM | POA: Diagnosis not present

## 2016-04-07 NOTE — Telephone Encounter (Signed)
[facility]ed to  [facility]

## 2016-04-09 ENCOUNTER — Telehealth: Payer: Self-pay | Admitting: Emergency Medicine

## 2016-04-09 NOTE — Telephone Encounter (Signed)
Spoke with patient and he is getting what he needs from the [place] now.

## 2016-04-09 NOTE — Telephone Encounter (Signed)
[facility] called and stated pt is politely refusing PT. He thought somebody was coming out to clean his house but doesn't want PT. Thanks.

## 2016-04-09 NOTE — Telephone Encounter (Signed)
He may be able to contact [facility] to see if they can do this. We cannot get someone to clean his house.

## 2016-05-05 ENCOUNTER — Encounter: Payer: Self-pay | Admitting: Hematology and Oncology

## 2016-05-05 NOTE — Progress Notes (Signed)
Pt returned my call and would like to re enroll for assistance thru [place] for Sprycel for 2018.  He will bring his proof of income on 05/06/16.

## 2016-05-05 NOTE — Progress Notes (Signed)
Left msg for pt to return my call regarding re enrolling for assistance w/ [place] for 2018.

## 2016-05-06 ENCOUNTER — Encounter: Payer: Self-pay | Admitting: Hematology and Oncology

## 2016-05-06 NOTE — Progress Notes (Signed)
Faxed completed [place] re enrollment application for [phone] along w/ required docs needed for processing today.

## 2016-05-11 DIAGNOSIS — H6123 Impacted cerumen, bilateral: Secondary | ICD-10-CM | POA: Diagnosis not present

## 2016-05-11 DIAGNOSIS — H906 Mixed conductive and sensorineural hearing loss, bilateral: Secondary | ICD-10-CM | POA: Diagnosis not present

## 2016-05-11 DIAGNOSIS — Z974 Presence of external hearing-aid: Secondary | ICD-10-CM | POA: Diagnosis not present

## 2016-05-11 DIAGNOSIS — H7203 Central perforation of tympanic membrane, bilateral: Secondary | ICD-10-CM | POA: Diagnosis not present

## 2016-05-14 ENCOUNTER — Other Ambulatory Visit: Payer: Self-pay

## 2016-05-19 ENCOUNTER — Emergency Department (HOSPITAL_COMMUNITY): Payer: Medicare Other

## 2016-05-19 ENCOUNTER — Observation Stay (HOSPITAL_BASED_OUTPATIENT_CLINIC_OR_DEPARTMENT_OTHER): Payer: Medicare Other

## 2016-05-19 ENCOUNTER — Encounter (HOSPITAL_COMMUNITY): Payer: Self-pay

## 2016-05-19 ENCOUNTER — Inpatient Hospital Stay (HOSPITAL_COMMUNITY)
Admission: EM | Admit: 2016-05-19 | Discharge: 2016-05-21 | DRG: 190 | Disposition: A | Discharge status: Home / Self Care | Payer: Medicare Other | Attending: Internal Medicine | Admitting: Internal Medicine

## 2016-05-19 DIAGNOSIS — D630 Anemia in neoplastic disease: Secondary | ICD-10-CM | POA: Diagnosis present

## 2016-05-19 DIAGNOSIS — Z79899 Other long term (current) drug therapy: Secondary | ICD-10-CM

## 2016-05-19 DIAGNOSIS — D471 Chronic myeloproliferative disease: Secondary | ICD-10-CM | POA: Diagnosis present

## 2016-05-19 DIAGNOSIS — Z951 Presence of aortocoronary bypass graft: Secondary | ICD-10-CM

## 2016-05-19 DIAGNOSIS — J441 Chronic obstructive pulmonary disease with (acute) exacerbation: Secondary | ICD-10-CM | POA: Diagnosis not present

## 2016-05-19 DIAGNOSIS — E1151 Type 2 diabetes mellitus with diabetic peripheral angiopathy without gangrene: Secondary | ICD-10-CM | POA: Diagnosis present

## 2016-05-19 DIAGNOSIS — R0600 Dyspnea, unspecified: Secondary | ICD-10-CM | POA: Diagnosis not present

## 2016-05-19 DIAGNOSIS — C9210 Chronic myeloid leukemia, BCR/ABL-positive, not having achieved remission: Secondary | ICD-10-CM | POA: Diagnosis present

## 2016-05-19 DIAGNOSIS — Z6835 Body mass index (BMI) 35.0-35.9, adult: Secondary | ICD-10-CM

## 2016-05-19 DIAGNOSIS — J9601 Acute respiratory failure with hypoxia: Secondary | ICD-10-CM | POA: Diagnosis not present

## 2016-05-19 DIAGNOSIS — Z7982 Long term (current) use of aspirin: Secondary | ICD-10-CM

## 2016-05-19 DIAGNOSIS — Z87891 Personal history of nicotine dependence: Secondary | ICD-10-CM | POA: Diagnosis not present

## 2016-05-19 DIAGNOSIS — B974 Respiratory syncytial virus as the cause of diseases classified elsewhere: Secondary | ICD-10-CM | POA: Diagnosis not present

## 2016-05-19 DIAGNOSIS — R069 Unspecified abnormalities of breathing: Secondary | ICD-10-CM | POA: Diagnosis not present

## 2016-05-19 DIAGNOSIS — I509 Heart failure, unspecified: Secondary | ICD-10-CM

## 2016-05-19 DIAGNOSIS — Z8546 Personal history of malignant neoplasm of prostate: Secondary | ICD-10-CM

## 2016-05-19 DIAGNOSIS — I2510 Atherosclerotic heart disease of native coronary artery without angina pectoris: Secondary | ICD-10-CM | POA: Diagnosis present

## 2016-05-19 DIAGNOSIS — Z955 Presence of coronary angioplasty implant and graft: Secondary | ICD-10-CM

## 2016-05-19 DIAGNOSIS — R0602 Shortness of breath: Secondary | ICD-10-CM | POA: Diagnosis not present

## 2016-05-19 DIAGNOSIS — Z933 Colostomy status: Secondary | ICD-10-CM

## 2016-05-19 DIAGNOSIS — I255 Ischemic cardiomyopathy: Secondary | ICD-10-CM | POA: Diagnosis present

## 2016-05-19 DIAGNOSIS — E785 Hyperlipidemia, unspecified: Secondary | ICD-10-CM | POA: Diagnosis present

## 2016-05-19 DIAGNOSIS — Z950 Presence of cardiac pacemaker: Secondary | ICD-10-CM

## 2016-05-19 DIAGNOSIS — E1165 Type 2 diabetes mellitus with hyperglycemia: Secondary | ICD-10-CM | POA: Diagnosis present

## 2016-05-19 DIAGNOSIS — R079 Chest pain, unspecified: Secondary | ICD-10-CM | POA: Diagnosis not present

## 2016-05-19 DIAGNOSIS — I5033 Acute on chronic diastolic (congestive) heart failure: Secondary | ICD-10-CM | POA: Diagnosis not present

## 2016-05-19 DIAGNOSIS — I5032 Chronic diastolic (congestive) heart failure: Secondary | ICD-10-CM | POA: Diagnosis present

## 2016-05-19 DIAGNOSIS — Z96641 Presence of right artificial hip joint: Secondary | ICD-10-CM | POA: Diagnosis present

## 2016-05-19 DIAGNOSIS — R05 Cough: Secondary | ICD-10-CM | POA: Diagnosis not present

## 2016-05-19 DIAGNOSIS — I110 Hypertensive heart disease with heart failure: Secondary | ICD-10-CM | POA: Diagnosis present

## 2016-05-19 DIAGNOSIS — Z7951 Long term (current) use of inhaled steroids: Secondary | ICD-10-CM

## 2016-05-19 DIAGNOSIS — E871 Hypo-osmolality and hyponatremia: Secondary | ICD-10-CM | POA: Diagnosis present

## 2016-05-19 DIAGNOSIS — T380X5A Adverse effect of glucocorticoids and synthetic analogues, initial encounter: Secondary | ICD-10-CM | POA: Diagnosis present

## 2016-05-19 DIAGNOSIS — E669 Obesity, unspecified: Secondary | ICD-10-CM | POA: Diagnosis present

## 2016-05-19 LAB — CBC WITH DIFFERENTIAL/PLATELET
BASOS PCT: 0 %
Basophils Absolute: 0 10*3/uL (ref 0.0–0.1)
EOS ABS: 0 10*3/uL (ref 0.0–0.7)
EOS PCT: 1 %
HCT: 28.4 % — ABNORMAL LOW (ref 39.0–52.0)
Hemoglobin: 9.3 g/dL — ABNORMAL LOW (ref 13.0–17.0)
LYMPHS ABS: 0.3 10*3/uL — AB (ref 0.7–4.0)
Lymphocytes Relative: 10 %
MCH: 32.6 pg (ref 26.0–34.0)
MCHC: 32.7 g/dL (ref 30.0–36.0)
MCV: 99.6 fL (ref 78.0–100.0)
Monocytes Absolute: 0.8 10*3/uL (ref 0.1–1.0)
Monocytes Relative: 26 %
NEUTROS PCT: 63 %
Neutro Abs: 1.9 10*3/uL (ref 1.7–7.7)
PLATELETS: 187 10*3/uL (ref 150–400)
RBC: 2.85 MIL/uL — AB (ref 4.22–5.81)
RDW: 15.1 % (ref 11.5–15.5)
WBC: 3.1 10*3/uL — AB (ref 4.0–10.5)

## 2016-05-19 LAB — RESPIRATORY PANEL BY PCR
Adenovirus: NOT DETECTED
BORDETELLA PERTUSSIS-RVPCR: NOT DETECTED
CORONAVIRUS 229E-RVPPCR: NOT DETECTED
Chlamydophila pneumoniae: NOT DETECTED
Coronavirus HKU1: NOT DETECTED
Coronavirus NL63: NOT DETECTED
Coronavirus OC43: NOT DETECTED
INFLUENZA B-RVPPCR: NOT DETECTED
Influenza A: NOT DETECTED
MYCOPLASMA PNEUMONIAE-RVPPCR: NOT DETECTED
Metapneumovirus: NOT DETECTED
PARAINFLUENZA VIRUS 1-RVPPCR: DETECTED — AB
Parainfluenza Virus 2: NOT DETECTED
Parainfluenza Virus 3: NOT DETECTED
Parainfluenza Virus 4: NOT DETECTED
RESPIRATORY SYNCYTIAL VIRUS-RVPPCR: DETECTED — AB
Rhinovirus / Enterovirus: NOT DETECTED

## 2016-05-19 LAB — BASIC METABOLIC PANEL
Anion gap: 9 (ref 5–15)
BUN: 15 mg/dL (ref 6–20)
CALCIUM: 9.2 mg/dL (ref 8.9–10.3)
CO2: 22 mmol/L (ref 22–32)
CREATININE: 1.13 mg/dL (ref 0.61–1.24)
Chloride: 100 mmol/L — ABNORMAL LOW (ref 101–111)
GFR calc non Af Amer: 56 mL/min — ABNORMAL LOW (ref >60)
GLUCOSE: 162 mg/dL — AB (ref 65–99)
Potassium: 3.9 mmol/L (ref 3.5–5.1)
Sodium: 131 mmol/L — ABNORMAL LOW (ref 135–145)

## 2016-05-19 LAB — TROPONIN I

## 2016-05-19 LAB — ECHOCARDIOGRAM COMPLETE
Height: 66 in
Weight: 3488 oz

## 2016-05-19 LAB — BRAIN NATRIURETIC PEPTIDE: B Natriuretic Peptide: 86.6 pg/mL (ref 0.0–100.0)

## 2016-05-19 LAB — I-STAT TROPONIN, ED: TROPONIN I, POC: 0.02 ng/mL (ref 0.00–0.08)

## 2016-05-19 LAB — GLUCOSE, CAPILLARY: Glucose-Capillary: 233 mg/dL — ABNORMAL HIGH (ref 65–99)

## 2016-05-19 LAB — TSH: TSH: 1.754 u[IU]/mL (ref 0.350–4.500)

## 2016-05-19 MED ORDER — LOSARTAN POTASSIUM 50 MG PO TABS
25.0000 mg | ORAL_TABLET | Freq: Every day | ORAL | Status: DC
  Administered 2016-05-19 – 2016-05-21 (×3): 25 mg via ORAL
  Filled 2016-05-19 (×3): qty 1

## 2016-05-19 MED ORDER — POLYETHYL GLYCOL-PROPYL GLYCOL 0.4-0.3 % OP SOLN: 2.0000 [drp] | Freq: Three times a day (TID) | OPHTHALMIC | Status: DC | PRN

## 2016-05-19 MED ORDER — SODIUM CHLORIDE 0.9 % IV SOLN: 250.0000 mL | INTRAVENOUS | Status: DC | PRN

## 2016-05-19 MED ORDER — IPRATROPIUM-ALBUTEROL 0.5-2.5 (3) MG/3ML IN SOLN
3.0000 mL | RESPIRATORY_TRACT | Status: DC | PRN
  Administered 2016-05-19 (×2): 3 mL via RESPIRATORY_TRACT
  Filled 2016-05-19 (×2): qty 3

## 2016-05-19 MED ORDER — PRAVASTATIN SODIUM 40 MG PO TABS
60.0000 mg | ORAL_TABLET | Freq: Every day | ORAL | Status: DC
  Administered 2016-05-19 – 2016-05-21 (×3): 60 mg via ORAL
  Filled 2016-05-19 (×3): qty 1

## 2016-05-19 MED ORDER — ONDANSETRON HCL 4 MG/2ML IJ SOLN: 4.0000 mg | Freq: Four times a day (QID) | INTRAMUSCULAR | Status: DC | PRN

## 2016-05-19 MED ORDER — ACETAMINOPHEN 500 MG PO TABS
1000.0000 mg | ORAL_TABLET | Freq: Four times a day (QID) | ORAL | Status: DC | PRN
  Administered 2016-05-19: 1000 mg via ORAL
  Filled 2016-05-19: qty 2

## 2016-05-19 MED ORDER — SODIUM CHLORIDE 0.9% FLUSH
3.0000 mL | Freq: Two times a day (BID) | INTRAVENOUS | Status: DC
  Administered 2016-05-19 – 2016-05-21 (×5): 3 mL via INTRAVENOUS

## 2016-05-19 MED ORDER — INSULIN ASPART 100 UNIT/ML ~~LOC~~ SOLN
0.0000 [IU] | Freq: Three times a day (TID) | SUBCUTANEOUS | Status: DC
  Administered 2016-05-20 (×2): 2 [IU] via SUBCUTANEOUS
  Administered 2016-05-20: 3 [IU] via SUBCUTANEOUS

## 2016-05-19 MED ORDER — POLYVINYL ALCOHOL 1.4 % OP SOLN
1.0000 [drp] | OPHTHALMIC | Status: DC | PRN
  Filled 2016-05-19: qty 15

## 2016-05-19 MED ORDER — METHYLPREDNISOLONE SODIUM SUCC 125 MG IJ SOLR
60.0000 mg | Freq: Two times a day (BID) | INTRAMUSCULAR | Status: DC
  Administered 2016-05-19 – 2016-05-20 (×3): 60 mg via INTRAVENOUS
  Filled 2016-05-19 (×3): qty 2

## 2016-05-19 MED ORDER — HYDROCODONE-ACETAMINOPHEN 7.5-325 MG PO TABS
1.0000 | ORAL_TABLET | Freq: Four times a day (QID) | ORAL | Status: DC | PRN
  Administered 2016-05-19 – 2016-05-20 (×3): 1 via ORAL
  Filled 2016-05-19 (×3): qty 1

## 2016-05-19 MED ORDER — ENOXAPARIN SODIUM 40 MG/0.4ML ~~LOC~~ SOLN
40.0000 mg | SUBCUTANEOUS | Status: DC
  Administered 2016-05-19 – 2016-05-20 (×2): 40 mg via SUBCUTANEOUS
  Filled 2016-05-19 (×2): qty 0.4

## 2016-05-19 MED ORDER — IPRATROPIUM-ALBUTEROL 0.5-2.5 (3) MG/3ML IN SOLN
3.0000 mL | Freq: Once | RESPIRATORY_TRACT | Status: AC
  Administered 2016-05-19: 3 mL via RESPIRATORY_TRACT
  Filled 2016-05-19: qty 3

## 2016-05-19 MED ORDER — SODIUM CHLORIDE 0.9% FLUSH: 3.0000 mL | INTRAVENOUS | Status: DC | PRN

## 2016-05-19 MED ORDER — GUAIFENESIN-DM 100-10 MG/5ML PO SYRP
5.0000 mL | ORAL_SOLUTION | ORAL | Status: DC | PRN
  Administered 2016-05-20 – 2016-05-21 (×3): 5 mL via ORAL
  Filled 2016-05-19 (×3): qty 10

## 2016-05-19 MED ORDER — AZITHROMYCIN 250 MG PO TABS
500.0000 mg | ORAL_TABLET | Freq: Every day | ORAL | Status: AC
  Administered 2016-05-19 – 2016-05-21 (×3): 500 mg via ORAL
  Filled 2016-05-19 (×3): qty 2

## 2016-05-19 MED ORDER — FAMOTIDINE 20 MG PO TABS
20.0000 mg | ORAL_TABLET | Freq: Every day | ORAL | Status: DC
  Administered 2016-05-19 – 2016-05-21 (×3): 20 mg via ORAL
  Filled 2016-05-19 (×3): qty 1

## 2016-05-19 MED ORDER — FLUTICASONE PROPIONATE 50 MCG/ACT NA SUSP: 2.0000 | Freq: Two times a day (BID) | NASAL | Status: DC | PRN

## 2016-05-19 MED ORDER — METHYLPREDNISOLONE SODIUM SUCC 125 MG IJ SOLR
125.0000 mg | Freq: Once | INTRAMUSCULAR | Status: AC
  Administered 2016-05-19: 125 mg via INTRAVENOUS
  Filled 2016-05-19: qty 2

## 2016-05-19 MED ORDER — FUROSEMIDE 10 MG/ML IJ SOLN
40.0000 mg | Freq: Every day | INTRAMUSCULAR | Status: DC
  Administered 2016-05-19: 40 mg via INTRAVENOUS
  Filled 2016-05-19: qty 4

## 2016-05-19 MED ORDER — DEXTROMETHORPHAN POLISTIREX ER 30 MG/5ML PO SUER
30.0000 mg | Freq: Two times a day (BID) | ORAL | Status: DC
  Administered 2016-05-19: 30 mg via ORAL
  Filled 2016-05-19 (×2): qty 5

## 2016-05-19 MED ORDER — NITROGLYCERIN 0.4 MG SL SUBL: 0.4000 mg | SUBLINGUAL_TABLET | SUBLINGUAL | Status: DC | PRN

## 2016-05-19 MED ORDER — TRIAMCINOLONE ACETONIDE 0.1 % EX CREA: 1.0000 "application " | TOPICAL_CREAM | Freq: Three times a day (TID) | CUTANEOUS | Status: DC | PRN

## 2016-05-19 MED ORDER — ASPIRIN EC 81 MG PO TBEC
81.0000 mg | DELAYED_RELEASE_TABLET | Freq: Every evening | ORAL | Status: DC
  Administered 2016-05-19 – 2016-05-20 (×2): 81 mg via ORAL
  Filled 2016-05-19 (×2): qty 1

## 2016-05-19 NOTE — ED Provider Notes (Signed)
Emergency Department Provider Note   I have reviewed the triage vital signs and the nursing notes.   HISTORY  Chief Complaint Shortness of Breath   HPI Eric [name] is a 82 y.o. male with past medical history significant for CML, CAD post CABG, hypertension and COPD came to ED with complaint of worsening dry cough and shortness of breath for one day. He also complain of chest pain associated with coughing. Patient states that he has chronic exertional dyspnea, but now he is unable to breathe properly while at rest too, he was unable to sleep last night because of  coughing and difficulty in breathing. He denies any headache, visual change, nasal congestion, sore throat, fever or chills.   Past Medical History:  Diagnosis Date  . ALLERGIC RHINITIS   . ANEMIA-NOS   . AORTIC SCLEROSIS   . Asthma   . CARDIOMYOPATHY, ISCHEMIC   . CAROTID BRUIT, RIGHT 02/27/2008  . Cataract    surgery  . CML (chronic myeloid leukemia) ([place]) 06/26/2015  . COPD   . CORONARY ARTERY DISEASE    CABG 1995, PTCA/DES 2008, 2009 and 08/2010  . DIABETES MELLITUS-TYPE II    diet controlled  . Diastolic dysfunction, Grade 1 11/24/2014  . Diverticulitis of colon with perforation 11/22/2014  . Diverticulosis   . GERD   . HIATAL HERNIA   . Hx of echocardiogram    Echo (9/15):  Mild LVH, EF 50-55%, no RWMA, Gr 1 DD, MAC, mild LAE.  [name] HYPERLIPIDEMIA   . HYPERTENSION   . Hyponatremia 11/22/2014  . IBS (irritable bowel syndrome)   . LACTOSE INTOLERANCE   . OA (osteoarthritis)   . OBESITY   . Partial small bowel obstruction   . PERIPHERAL VASCULAR DISEASE   . Primary hyperparathyroidism ([place])    Lab Results Component Value Date  PTH 150.7* 02/13/2013  CALCIUM 11.0* 02/13/2013  CAION 1.21 03/15/2008    . Prostate cancer ([place])    seed implants 2004  . SICK SINUS/ TACHY-BRADY SYNDROME 09/2007   s/p PPM st judes  . Sleep apnea   . SMALL BOWEL OBSTRUCTION 04/18/2009   Qualifier: History of  By: Asa Lente MD,  Jannifer Rodney Symptomatic diverticulosis 01/18/2009   Qualifier: Diagnosis of  By: Shane Crutch, Amy S     Patient Active Problem List   Diagnosis Date Noted  . Diarrhea due to drug 01/28/2016  . Anemia in neoplastic disease 12/12/2015  . Acquired pancytopenia ([place]) 10/28/2015  . Anemia in chronic illness 07/18/2015  . Tachy-brady syndrome ([place]) 07/10/2015  . Bilateral carotid artery stenosis 07/10/2015  . Essential hypertension 07/10/2015  . CML (chronic myeloid leukemia) ([place]) 06/26/2015  . Myeloproliferative neoplasm ([place]) 06/25/2015  . Dizziness 05/27/2015  . Cough 04/05/2015  . Back pain 12/10/2014  . Obesity (BMI 30-39.9) 12/08/2014  . Hartmann's pouch of intestine ([place]) 12/08/2014  . HCAP (healthcare-associated pneumonia) 12/07/2014  . CAD (coronary artery disease) of artery bypass graft 11/28/2014  . Chronic diastolic heart failure ([place]) 11/24/2014  . Hyponatremia 11/22/2014  . GERD (gastroesophageal reflux disease) 03/27/2014  . Pacemaker 04/10/2013  . Obstructive sleep apnea 04/11/2011  . Type 2 diabetes mellitus, controlled ([place]) 01/18/2009  . Dyslipidemia 01/18/2009  . Hypertensive heart disease with CHF ([place]) 01/18/2009  . Coronary atherosclerosis 01/18/2009  . GERD 01/18/2009  . COPD mixed type ([place]) 12/27/2006    Past Surgical History:  Procedure Laterality Date  . BACK SURGERY    . Bilateral cataracts    .  COLON RESECTION N/A 11/28/2014   Procedure: EXPLORATORY LAPAROTOMY, SIGMOID COLECTOMY WITH COLOSTOMY;  Surgeon: Jackolyn Confer, MD;  Location: [facility];  Service: General;  Laterality: N/A;  . COLON SURGERY    . COLONOSCOPY    . CORONARY ARTERY BYPASS GRAFT    . ESOPHAGOGASTRODUODENOSCOPY  multiple  . FLEXIBLE SIGMOIDOSCOPY N/A 09/22/2013   Procedure: FLEXIBLE SIGMOIDOSCOPY;  Surgeon: Gatha Mayer, MD;  [facility];  Service: Endoscopy;  Laterality: N/A;  . INGUINAL HERNIA REPAIR Bilateral   . LUMBAR [place] SURGERY  12/2008  . PACEMAKER  INSERTION     DDD/[employer]         Last interrogation 2/13  on chart     Pacemaker guideline order Dr Tamala Julian on chart  . Partial small bowel obstruction  2009  . PENILE PROSTHESIS PLACEMENT    . PTCA  2008, 2009, 2012   with DES  . TOTAL HIP ARTHROPLASTY  08/21/2011   Procedure: TOTAL HIP ARTHROPLASTY;  Surgeon: Johnn Hai, MD;  Location: [facility];  Service: Orthopedics;  Laterality: Right;    Current Outpatient Rx  . Order #: [id number] Class: Historical Med  . Order #: [id number] Class: Historical Med  . Order #: [id number] Class: Normal  . Order #: [id number] Class: Historical Med  . Order #: [id number] Class: Historical Med  . Order #: [id number] Class: Print  . Order #: [id number] Class: Print  . Order #: [id number] Class: Historical Med  . Order #: [id number] Class: Historical Med  . Order #: [id number] Class: Historical Med  . Order #: [id number] Class: Normal  . Order #: [id number] Class: Historical Med  . Order #: [id number] Class: Print  . Order #: [id number] Class: Historical Med  . Order #: [id number] Class: Historical Med  . Order #: [id number] Class: Normal  . Order #: [id number] Class: Print    Allergies Actos [pioglitazone hydrochloride]; Celebrex [celecoxib]; Demerol; Morphine and related; Ciprofloxacin; Metformin; and Zocor [simvastatin]  Family History  Problem Relation Age of Onset  . Hypertension Mother   . Cancer Mother   . Heart attack    . Heart attack    . Heart attack Brother   . Colon cancer Neg Hx   . Stroke Neg Hx     Social History Social History  Substance Use Topics  . Smoking status: Former Smoker    Packs/day: 1.00    Years: 25.00    Types: Cigarettes    Quit date: 06/08/1994  . Smokeless tobacco: Never Used  . Alcohol use No    Review of Systems Constitutional: No fever/chills, dry cough. Eyes: No visual changes. ENT: No sore throat. Cardiovascular:  chest pain. Respiratory:  shortness of breath. Gastrointestinal: No abdominal pain.  No nausea, no  vomiting.  No diarrhea.  No constipation. Genitourinary: Negative for dysuria. Musculoskeletal: Negative for back pain. Skin: Negative for rash. Neurological: Negative for headaches, focal weakness or numbness. 10-point ROS otherwise negative.  ____________________________________________   PHYSICAL EXAM:  VITAL SIGNS: ED Triage Vitals  Enc Vitals Group     BP 05/19/16 0839 149/92     Pulse Rate 05/19/16 0839 97     Resp 05/19/16 0839 24     Temp 05/19/16 0839 98.5 F (36.9 C)     Temp Source 05/19/16 0839 Oral     SpO2 05/19/16 0839 100 %     Weight --      Height --      Head Circumference --      Peak Flow --      Pain Score 05/19/16 0849 7  Pain Loc --      Pain Edu? --      Excl. in [place]? --    Constitutional: Alert and oriented. Appears mildly distress because of shortness of breath. Eyes: Conjunctivae are normal. PERRL. EOMI. Head: Atraumatic. Nose: No congestion/rhinnorhea. Mouth/Throat: Mucous membranes are moist.  Oropharynx non-erythematous. Neck: No stridor.  No meningeal signs. No JVD. Cardiovascular: Mild tachycardia. Good peripheral circulation. Grossly normal heart sounds.   Respiratory:  Tachypneic, bilateral expiratory wheezing. Gastrointestinal: Soft and nontender. No distention.  Musculoskeletal: No lower extremity tenderness nor edema. No gross deformities of extremities. Neurologic:  Normal speech and language. No gross focal neurologic deficits are appreciated.  Skin:  Skin is warm, dry and intact. No rash noted.  ____________________________________________   LABS (all labs ordered are listed, but only abnormal results are displayed)  Labs Reviewed  CBC WITH DIFFERENTIAL/PLATELET - Abnormal; Notable for the following:       Result Value   WBC 3.1 (*)    RBC 2.85 (*)    Hemoglobin 9.3 (*)    HCT 28.4 (*)    Lymphs Abs 0.3 (*)    All other components within normal limits  BASIC METABOLIC PANEL  I-STAT TROPOININ, ED    ____________________________________________  EKG   EKG Interpretation  Date/Time:  Tuesday May 19 2016 08:47:40 EST Ventricular Rate:  115 PR Interval:    QRS Duration: 107 QT Interval:  352 QTC Calculation: 461 R Axis:   55 Text Interpretation:  Sinus tachycardia Paired ventricular premature complexes Low voltage, extremity and precordial leads Nonspecific repol abnormality, lateral leads Baseline wander in lead(s) II aVF No significant change since last tracing Confirmed by ALLEN  MD, ANTHONY ([id number]) on 05/19/2016 9:39:00 AM       ____________________________________________  RADIOLOGY  Dg Chest 2 View  Result Date: 05/19/2016 CLINICAL DATA:  Several days of chest congestion and cough with some chest pain and shortness of breath. History of COPD, ischemic cardiomyopathy, diabetes, former smoker. EXAM: CHEST  2 VIEW COMPARISON:  PA and lateral chest x-ray of December 01, 2015 FINDINGS: The lungs are well-expanded. The interstitial markings are coarse. There is no alveolar infiltrate or pleural effusion. The cardiac silhouette is enlarged. The central pulmonary vascularity is mildly prominent but there is no cephalization. There is calcification in the wall of the thoracic aorta. There are post CABG changes. The ICD is in stable position. There is multilevel degenerative disc disease of the thoracic spine. IMPRESSION: COPD. No focal pneumonia. Cardiomegaly with mild central pulmonary vascular congestion. No definite pulmonary edema. Thoracic aortic atherosclerosis. Electronically Signed   By: David  Martinique M.D.   On: 05/19/2016 09:49    ____________________________________________   PROCEDURES  Procedure(s) performed:   Procedures   ____________________________________________   INITIAL IMPRESSION / ASSESSMENT AND PLAN / ED COURSE  Pertinent labs & imaging results that were available during my care of the patient were reviewed by me and considered in my medical  decision making (see chart for details).  Eric [name] is a 82 y.o. male with past medical history significant for CML, CAD post CABG, hypertension and COPD came to ED with complaint of worsening dry cough and shortness of breath for one day. He also complain of chest pain associated with coughing. He has mild tachycardia and tachypnea with bilateral scattered wheezing on exam.  It looks like an COPD exacerbation.  His wheezing improved after DuoNeb and Solu-Medrol.  He is still complaining of shortness of breath and cough. [facility] hospitalist was  consulted for admission.  ____________________________________________  FINAL CLINICAL IMPRESSION(S) / ED DIAGNOSES  COPD exacerbation.   MEDICATIONS GIVEN DURING THIS VISIT:  Medications  dextromethorphan (DELSYM) 30 MG/5ML liquid 30 mg (not administered)  ipratropium-albuterol (DUONEB) 0.5-2.5 (3) MG/3ML nebulizer solution 3 mL (3 mLs Nebulization Given 05/19/16 0947)  methylPREDNISolone sodium succinate (SOLU-MEDROL) 125 mg/2 mL injection 125 mg (125 mg Intravenous Given 05/19/16 1006)     NEW OUTPATIENT MEDICATIONS STARTED DURING THIS VISIT:  New Prescriptions   No medications on file      Note:  This document was prepared using Dragon voice recognition software and may include unintentional dictation errors.     Lorella Nimrod, MD 05/19/16 [phone]

## 2016-05-19 NOTE — H&P (Addendum)
History and Physical    Eric [name] [id number] DOB: 1927/01/18 DOA: 05/19/2016  PCP: [name], MD  Patient coming from:  [place], independent housing.   Chief Complaint: sob since 2 days.   HPI: Eric [name] is a 82 y.o. male with medical history significant for CAD, s/p CABG, chronic diastolic heart failure,  hypertension, COPD, CML, presents today to ED for sudden worsening sob, associated with cough for a couple of days. Dry cough. No fever or chills. No pedal edema. No syncope. Some chest tightness with breathing, worsening with deep inspiration.  No nausea, but one episode of vomiting , no abdominal pain or diarrhea. No hematemesis or hematochezia. No dysuria. On arrival to ED, he underwent CXR showing some mild pulm edema. Labs reveal, normal BNP, negative troponin s, sodium of 131, hemoglobin of 9.3. He was referred to medical service for evaluation of acute respiratory failure sec to copd exacerbation and CHF exacerbation.     Review of Systems: As per HPI otherwise 10 point review of systems negative.   Past Medical History:  Diagnosis Date  . ALLERGIC RHINITIS   . ANEMIA-NOS   . AORTIC SCLEROSIS   . Asthma   . CARDIOMYOPATHY, ISCHEMIC   . CAROTID BRUIT, RIGHT 02/27/2008  . Cataract    surgery  . CML (chronic myeloid leukemia) (Eric [name]) 06/26/2015  . COPD   . CORONARY ARTERY DISEASE    CABG 1995, PTCA/DES 2008, 2009 and 08/2010  . DIABETES MELLITUS-TYPE II    diet controlled  . Diastolic dysfunction, Grade 1 11/24/2014  . Diverticulitis of colon with perforation 11/22/2014  . Diverticulosis   . GERD   . HIATAL HERNIA   . Hx of echocardiogram    Echo (9/15):  Mild LVH, EF 50-55%, no RWMA, Gr 1 DD, MAC, mild LAE.  [name] HYPERLIPIDEMIA   . HYPERTENSION   . Hyponatremia 11/22/2014  . IBS (irritable bowel syndrome)   . LACTOSE INTOLERANCE   . OA (osteoarthritis)   . OBESITY   . Partial small bowel obstruction   . PERIPHERAL VASCULAR DISEASE   .  Primary hyperparathyroidism ([place])    Lab Results Component Value Date  PTH 150.7* 02/13/2013  CALCIUM 11.0* 02/13/2013  CAION 1.21 03/15/2008    . Prostate cancer ([place])    seed implants 2004  . SICK SINUS/ TACHY-BRADY SYNDROME 09/2007   s/p PPM st judes  . Sleep apnea   . SMALL BOWEL OBSTRUCTION 04/18/2009   Qualifier: History of  By: Asa Lente MD, Jannifer Rodney Symptomatic diverticulosis 01/18/2009   Qualifier: Diagnosis of  By: Shane Crutch, Amy S     Past Surgical History:  Procedure Laterality Date  . BACK SURGERY    . Bilateral cataracts    . COLON RESECTION N/A 11/28/2014   Procedure: EXPLORATORY LAPAROTOMY, SIGMOID COLECTOMY WITH COLOSTOMY;  Surgeon: Jackolyn Confer, MD;  [facility];  Service: General;  Laterality: N/A;  . COLON SURGERY    . COLONOSCOPY    . CORONARY ARTERY BYPASS GRAFT    . ESOPHAGOGASTRODUODENOSCOPY  multiple  . FLEXIBLE SIGMOIDOSCOPY N/A 09/22/2013   Procedure: FLEXIBLE SIGMOIDOSCOPY;  Surgeon: Gatha Mayer, MD;  [facility];  Service: Endoscopy;  Laterality: N/A;  . INGUINAL HERNIA REPAIR Bilateral   . LUMBAR [place] SURGERY  12/2008  . PACEMAKER INSERTION     DDD/[employer]         Last interrogation 2/13  on chart     Pacemaker guideline order Dr  Smith on chart  . Partial small bowel obstruction  2009  . PENILE PROSTHESIS PLACEMENT    . PTCA  2008, 2009, 2012   with DES  . TOTAL HIP ARTHROPLASTY  08/21/2011   Procedure: TOTAL HIP ARTHROPLASTY;  Surgeon: Johnn Hai, MD;  [facility];  Service: Orthopedics;  Laterality: Right;     reports that he quit smoking about 21 years ago. His smoking use included Cigarettes. He has a 25.00 pack-year smoking history. He has never used smokeless tobacco. He reports that he does not drink alcohol or use drugs.  Allergies  Allergen Reactions  . Actos [Pioglitazone Hydrochloride] Other (See Comments)    "felt funny, drowsy, and weak":  . Celebrex [Celecoxib] Other (See Comments)    "felt  funny"  . Demerol Palpitations and Other (See Comments)    Increased BP  . Morphine And Related Nausea And Vomiting  . Ciprofloxacin Other (See Comments)    arthralgia  . Metformin Nausea And Vomiting  . Zocor [Simvastatin] Other (See Comments)    Makes pt very drowsy    Family History  Problem Relation Age of Onset  . Hypertension Mother   . Cancer Mother   . Heart attack    . Heart attack    . Heart attack Brother   . Colon cancer Neg Hx   . Stroke Neg Hx    Reviewed.  Prior to Admission medications   Medication Sig Start Date End Date Taking? Authorizing Provider  acetaminophen (TYLENOL) 500 MG tablet Take 1,000 mg by mouth every 6 (six) hours as needed for mild pain or fever.   Yes Historical Provider, MD  albuterol (PROVENTIL HFA) 108 (90 BASE) MCG/ACT inhaler Inhale 2 puffs into the lungs every 4 (four) hours as needed for shortness of breath. Wheezing    Yes Historical Provider, MD  albuterol (PROVENTIL) (2.5 MG/3ML) 0.083% nebulizer solution USE 1 VIAL WITH NEBULIZER EVERY 4 HOURS AS NEEDED FOR WHEEZING 11/25/15  Yes Deneise Lever, MD  albuterol-ipratropium (COMBIVENT) 18-103 MCG/ACT inhaler Inhale 1 puff into the lungs every 6 (six) hours as needed for wheezing or shortness of breath.   Yes Historical Provider, MD  aspirin 81 MG tablet Take 81 mg by mouth every evening.    Yes Historical Provider, MD  dasatinib (SPRYCEL) 70 MG tablet Take 1 tablet (70 mg total) by mouth daily. 06/26/15  Yes Heath Lark, MD  fluticasone (FLONASE) 50 MCG/ACT nasal spray Place 2 sprays into both nostrils daily. Allergies Patient taking differently: Place 2 sprays into both nostrils 2 (two) times daily as needed for allergies. Allergies 08/01/14  Yes [name], MD  hydrochlorothiazide (HYDRODIURIL) 25 MG tablet Take 12.5 mg by mouth daily.   Yes Historical Provider, MD  HYDROcodone-acetaminophen (NORCO) 7.5-325 MG tablet Take 1 tablet by mouth every 6 (six) hours as needed for  moderate pain.   Yes Historical Provider, MD  nitroGLYCERIN (NITROSTAT) 0.4 MG SL tablet Place 0.4 mg under the tongue every 5 (five) minutes as needed for chest pain. Reported on 12/12/2015   Yes Historical Provider, MD  ONE TOUCH ULTRA TEST test strip USE TO CHECK BLOOD SUGARS ONCE A DAY 02/24/16  Yes [name], MD  Polyethyl Glycol-Propyl Glycol (SYSTANE) 0.4-0.3 % SOLN Apply 2 drops to eye 3 (three) times daily as needed (dry eyes).    Yes Historical Provider, MD  pravastatin (PRAVACHOL) 20 MG tablet Take 3 tablets (60 mg total) by mouth daily. 08/01/14  Yes Real Cons  Sharlet Salina, MD  ranitidine (ZANTAC) 300 MG tablet Take 300 mg by mouth at bedtime.    Yes Historical Provider, MD  triamcinolone cream (KENALOG) 0.1 % Apply 1 application topically 3 (three) times daily as needed (dry skin.).  07/19/15  Yes Historical Provider, MD  hydrochlorothiazide (MICROZIDE) 12.5 MG capsule Take 1 capsule (12.5 mg total) by mouth daily. Patient not taking: Reported on 05/19/2016 07/10/15   Belva Crome, MD  valsartan (DIOVAN) 160 MG tablet Take 1 tablet (160 mg total) by mouth daily. Patient not taking: Reported on 05/19/2016 08/01/14   [name], MD    Physical Exam: Vitals:   05/19/16 1030 05/19/16 1130 05/19/16 1337 05/19/16 1352  BP: 165/62 170/60  (!) 150/63  Pulse: 101 98  90  Resp: 20 25  (!) 22  Temp:    98 F (36.7 C)  TempSrc:    Oral  SpO2: 96% 96%  100%  Weight:   99.6 kg (219 lb 9.3 oz)   Height:   5\' 6"  (1.676 m)       Constitutional: NAD, calm, comfortable Vitals:   05/19/16 1030 05/19/16 1130 05/19/16 1337 05/19/16 1352  BP: 165/62 170/60  (!) 150/63  Pulse: 101 98  90  Resp: 20 25  (!) 22  Temp:    98 F (36.7 C)  TempSrc:    Oral  SpO2: 96% 96%  100%  Weight:   99.6 kg (219 lb 9.3 oz)   Height:   5\' 6"  (1.676 m)    Eyes: PERRL, lids and conjunctivae normal ENMT: Mucous membranes are moist. Posterior pharynx clear of any exudate or lesions.Normal  dentition.  Neck: normal, supple, no masses, no thyromegaly Respiratory: fair air entry, scattered wheezing , no rhonchi.  Cardiovascular: Regular rate and rhythm, no murmurs / rubs / gallops. No extremity edema. 2+ pedal pulses. No carotid bruits.  Abdomen: no tenderness, no masses palpated. No hepatosplenomegaly. Bowel sounds positive. Colostomy bag in place.  Musculoskeletal: no clubbing / cyanosis. No joint deformity upper and lower extremities. Good ROM, no contractures. Normal muscle tone.  Skin: no rashes, lesions, ulcers. No induration Neurologic: CN 2-12 grossly intact. Sensation intact, DTR normal. Strength 5/5 in all 4.    Labs on Admission: I have personally reviewed following labs and imaging studies  CBC:  Recent Labs Lab 05/19/16 1001  WBC 3.1*  NEUTROABS 1.9  HGB 9.3*  HCT 28.4*  MCV 99.6  PLT 123XX123   Basic Metabolic Panel:  Recent Labs Lab 05/19/16 1126  NA 131*  K 3.9  CL 100*  CO2 22  GLUCOSE 162*  BUN 15  CREATININE 1.13  CALCIUM 9.2   GFR: Estimated Creatinine Clearance: 50.9 mL/min (by C-G formula based on SCr of 1.13 mg/dL). Liver Function Tests: No results for input(s): AST, ALT, ALKPHOS, BILITOT, PROT, ALBUMIN in the last 168 hours. No results for input(s): LIPASE, AMYLASE in the last 168 hours. No results for input(s): AMMONIA in the last 168 hours. Coagulation Profile: No results for input(s): INR, PROTIME in the last 168 hours. Cardiac Enzymes: No results for input(s): CKTOTAL, CKMB, CKMBINDEX, TROPONINI in the last 168 hours. BNP (last 3 results)  Recent Labs  05/27/15 1512 11/27/15 1621  PROBNP 80.0 296.0*   HbA1C: No results for input(s): HGBA1C in the last 72 hours. CBG: No results for input(s): GLUCAP in the last 168 hours. Lipid Profile: No results for input(s): CHOL, HDL, LDLCALC, TRIG, CHOLHDL, LDLDIRECT in the last 72 hours. Thyroid Function Tests:  No results for input(s): TSH, T4TOTAL, FREET4, T3FREE, THYROIDAB in the  last 72 hours. Anemia Panel: No results for input(s): VITAMINB12, FOLATE, FERRITIN, TIBC, IRON, RETICCTPCT in the last 72 hours. Urine analysis:    Component Value Date/Time   COLORURINE YELLOW 12/07/2014 2006   APPEARANCEUR CLEAR 12/07/2014 2006   LABSPEC 1.009 12/07/2014 2006   PHURINE 7.5 12/07/2014 2006   GLUCOSEU NEGATIVE 12/07/2014 2006   GLUCOSEU NEGATIVE 02/09/2014 0953   HGBUR NEGATIVE 12/07/2014 2006   BILIRUBINUR NEGATIVE 12/07/2014 2006   [place] 12/07/2014 2006   PROTEINUR NEGATIVE 12/07/2014 2006   UROBILINOGEN 0.2 12/07/2014 2006   NITRITE NEGATIVE 12/07/2014 2006   LEUKOCYTESUR NEGATIVE 12/07/2014 2006   Sepsis Labs: !!!!!!!!!!!!!!!!!!!!!!!!!!!!!!!!!!!!!!!!!!!! @LABRCNTIP (procalcitonin:4,lacticidven:4) )No results found for this or any previous visit (from the past 240 hour(s)).   Radiological Exams on Admission: Dg Chest 2 View  Result Date: 05/19/2016 CLINICAL DATA:  Several days of chest congestion and cough with some chest pain and shortness of breath. History of COPD, ischemic cardiomyopathy, diabetes, former smoker. EXAM: CHEST  2 VIEW COMPARISON:  PA and lateral chest x-ray of December 01, 2015 FINDINGS: The lungs are well-expanded. The interstitial markings are coarse. There is no alveolar infiltrate or pleural effusion. The cardiac silhouette is enlarged. The central pulmonary vascularity is mildly prominent but there is no cephalization. There is calcification in the wall of the thoracic aorta. There are post CABG changes. The ICD is in stable position. There is multilevel degenerative disc disease of the thoracic spine. IMPRESSION: COPD. No focal pneumonia. Cardiomegaly with mild central pulmonary vascular congestion. No definite pulmonary edema. Thoracic aortic atherosclerosis. Electronically Signed   By: David  Martinique M.D.   On: 05/19/2016 09:49    EKG: Independently reviewed. Sinus tachycardia at 115/min  Assessment/Plan Active Problems:   COPD  exacerbation (HCC)   CHF exacerbation (HCC)    COPD exacerbation:  Admit to telemtery. Was able to wean him off the oxygen after admission.  Started on IV solumedrol 60 every 12 hours, resume bronchodilators, zithromax for bronchitis. [place] oxygen as needed.  Respiratory panel.   Acute on chronic diastolic heart failure: Admit to telemetry.  EKG shows sinus tachycardia.  Echocardiogram ordered.  Strict intake and output.  Daily weights.  serial troponin's ordered.   Diabetes Mellitus: CBG (last 3)  No results for input(s): GLUCAP in the last 72 hours.  Resume SSI.  hgba1c ordered.    Hypertension; controlled.   CAD:s/p CABG rule out ACS.   DVT prophylaxis: lovenox Code Status: full code.  Family Communication: none at bedside.  Disposition Plan: pending further eval.  Consults called: none.  Admission status: telemetry.    Hosie Poisson MD [facility] Pager [phone]  If 7PM-7AM, please contact night-coverage [email or web address] Password [email or web address]  05/19/2016, 2:21 PM

## 2016-05-19 NOTE — ED Provider Notes (Signed)
I saw and evaluated the patient, reviewed the resident's note and I agree with the findings and plan.   EKG Interpretation  Date/Time:  Tuesday May 19 2016 08:47:40 EST Ventricular Rate:  115 PR Interval:    QRS Duration: 107 QT Interval:  352 QTC Calculation: 461 R Axis:   55 Text Interpretation:  Sinus tachycardia Paired ventricular premature complexes Low voltage, extremity and precordial leads Nonspecific repol abnormality, lateral leads Baseline wander in lead(s) II aVF No significant change since last tracing Confirmed by Lugene Hitt  MD, Kye Hedden ([id number]) on 05/19/2016 9:39:00 AM      A 82-year-old male presents with signs of breath and operative cough since yesterday. Has a history of COPD. Was treated with albuterol by EMS. Chest x-ray without acute findings. Patient continues with wheezing and is likely having a COPD exacerbation will be admitted to the hospitalist service.   Lacretia Leigh, MD 05/19/16 1126

## 2016-05-19 NOTE — ED Notes (Signed)
Bed: WA04 Expected date:  Expected time:  Means of arrival:  Comments: EMS resp distress

## 2016-05-19 NOTE — Consult Note (Signed)
[place] Nurse ostomy consult note Stoma type/location: LLQ colostomy with large parastomal hernia. Stoma created in June 2015, approximately 18 months ago. ([place])  Daughter in room for beginning of visit, must leave prior to dark. Stomal assessment/size: 1 and 3/4 inches round Peristomal assessment: Intact, clear Treatment options for stomal/peristomal skin: none indicated Output: Brown soft stool Ostomy pouching: 2pc. Pouching system, 2 and 3/4 inches. 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  He indicates understanding, but this may be his preference.  Understands that hernia repair is not indicated; notes that he is growing tired of self-care. Understands that we do not use integrated gas filters for ostomy pouching here at [facility]; will use our pouches while here and return to those he likes once discharged.  Is going to [employer] group and communicates with the President, Rachel Moulds.  Enrolled patient in [place] program: No. Patient uses [facility] for his supplies.  Receives 30 pouches per month.  Supplies last 2.5-3 days.  [place] nursing team will not follow, but will remain available to this patient, the nursing and medical teams.  Please re-consult if needed. Thanks, Maudie Flakes, MSN, RN, [place], Arther Abbott  Pager# [phone]

## 2016-05-19 NOTE — ED Triage Notes (Signed)
Per EMS, pt is from home with complaints of SOB and a non-productive cough since yesterday. EMS reports that pt was wheezing in all lung fields. Pt received 10mg  of albuterol and 0.5mg  of atrovent and wheezing decreased after. Pt was seen at the [facility] last week and had either a chest xray or a chest CT done and was called to come back in for a consult about the results.

## 2016-05-19 NOTE — Progress Notes (Signed)
  Echocardiogram 2D Echocardiogram has been performed.  Eric [name] 05/19/2016, 4:26 PM

## 2016-05-20 DIAGNOSIS — E1165 Type 2 diabetes mellitus with hyperglycemia: Secondary | ICD-10-CM | POA: Diagnosis present

## 2016-05-20 DIAGNOSIS — E785 Hyperlipidemia, unspecified: Secondary | ICD-10-CM | POA: Diagnosis present

## 2016-05-20 DIAGNOSIS — I5033 Acute on chronic diastolic (congestive) heart failure: Secondary | ICD-10-CM | POA: Diagnosis not present

## 2016-05-20 DIAGNOSIS — J9601 Acute respiratory failure with hypoxia: Secondary | ICD-10-CM | POA: Diagnosis present

## 2016-05-20 DIAGNOSIS — J441 Chronic obstructive pulmonary disease with (acute) exacerbation: Principal | ICD-10-CM

## 2016-05-20 DIAGNOSIS — Z7951 Long term (current) use of inhaled steroids: Secondary | ICD-10-CM | POA: Diagnosis not present

## 2016-05-20 DIAGNOSIS — Z8546 Personal history of malignant neoplasm of prostate: Secondary | ICD-10-CM | POA: Diagnosis not present

## 2016-05-20 DIAGNOSIS — I110 Hypertensive heart disease with heart failure: Secondary | ICD-10-CM | POA: Diagnosis present

## 2016-05-20 DIAGNOSIS — E871 Hypo-osmolality and hyponatremia: Secondary | ICD-10-CM

## 2016-05-20 DIAGNOSIS — Z950 Presence of cardiac pacemaker: Secondary | ICD-10-CM | POA: Diagnosis not present

## 2016-05-20 DIAGNOSIS — I2510 Atherosclerotic heart disease of native coronary artery without angina pectoris: Secondary | ICD-10-CM | POA: Diagnosis present

## 2016-05-20 DIAGNOSIS — Z955 Presence of coronary angioplasty implant and graft: Secondary | ICD-10-CM | POA: Diagnosis not present

## 2016-05-20 DIAGNOSIS — D471 Chronic myeloproliferative disease: Secondary | ICD-10-CM | POA: Diagnosis not present

## 2016-05-20 DIAGNOSIS — I255 Ischemic cardiomyopathy: Secondary | ICD-10-CM | POA: Diagnosis present

## 2016-05-20 DIAGNOSIS — I509 Heart failure, unspecified: Secondary | ICD-10-CM

## 2016-05-20 DIAGNOSIS — Z87891 Personal history of nicotine dependence: Secondary | ICD-10-CM | POA: Diagnosis not present

## 2016-05-20 DIAGNOSIS — R0602 Shortness of breath: Secondary | ICD-10-CM | POA: Diagnosis not present

## 2016-05-20 DIAGNOSIS — J210 Acute bronchiolitis due to respiratory syncytial virus: Secondary | ICD-10-CM | POA: Diagnosis not present

## 2016-05-20 DIAGNOSIS — C9210 Chronic myeloid leukemia, BCR/ABL-positive, not having achieved remission: Secondary | ICD-10-CM | POA: Diagnosis present

## 2016-05-20 DIAGNOSIS — I5032 Chronic diastolic (congestive) heart failure: Secondary | ICD-10-CM | POA: Diagnosis present

## 2016-05-20 DIAGNOSIS — T380X5A Adverse effect of glucocorticoids and synthetic analogues, initial encounter: Secondary | ICD-10-CM | POA: Diagnosis present

## 2016-05-20 DIAGNOSIS — E1151 Type 2 diabetes mellitus with diabetic peripheral angiopathy without gangrene: Secondary | ICD-10-CM | POA: Diagnosis present

## 2016-05-20 DIAGNOSIS — E669 Obesity, unspecified: Secondary | ICD-10-CM | POA: Diagnosis present

## 2016-05-20 DIAGNOSIS — Z951 Presence of aortocoronary bypass graft: Secondary | ICD-10-CM | POA: Diagnosis not present

## 2016-05-20 DIAGNOSIS — Z7982 Long term (current) use of aspirin: Secondary | ICD-10-CM | POA: Diagnosis not present

## 2016-05-20 DIAGNOSIS — B974 Respiratory syncytial virus as the cause of diseases classified elsewhere: Secondary | ICD-10-CM | POA: Diagnosis present

## 2016-05-20 DIAGNOSIS — Z6835 Body mass index (BMI) 35.0-35.9, adult: Secondary | ICD-10-CM | POA: Diagnosis not present

## 2016-05-20 DIAGNOSIS — Z96641 Presence of right artificial hip joint: Secondary | ICD-10-CM | POA: Diagnosis present

## 2016-05-20 DIAGNOSIS — Z79899 Other long term (current) drug therapy: Secondary | ICD-10-CM | POA: Diagnosis not present

## 2016-05-20 LAB — GLUCOSE, CAPILLARY
GLUCOSE-CAPILLARY: 163 mg/dL — AB (ref 65–99)
GLUCOSE-CAPILLARY: 193 mg/dL — AB (ref 65–99)
GLUCOSE-CAPILLARY: 174 mg/dL — AB (ref 65–99)
Glucose-Capillary: 229 mg/dL — ABNORMAL HIGH (ref 65–99)

## 2016-05-20 LAB — BASIC METABOLIC PANEL
ANION GAP: 11 (ref 5–15)
Anion gap: 11 (ref 5–15)
BUN: 21 mg/dL — ABNORMAL HIGH (ref 6–20)
BUN: 28 mg/dL — AB (ref 6–20)
CHLORIDE: 97 mmol/L — AB (ref 101–111)
CO2: 18 mmol/L — ABNORMAL LOW (ref 22–32)
CO2: 21 mmol/L — ABNORMAL LOW (ref 22–32)
CREATININE: 1.26 mg/dL — AB (ref 0.61–1.24)
Calcium: 9 mg/dL (ref 8.9–10.3)
Calcium: 9.4 mg/dL (ref 8.9–10.3)
Chloride: 98 mmol/L — ABNORMAL LOW (ref 101–111)
Creatinine, Ser: 1.1 mg/dL (ref 0.61–1.24)
GFR calc Af Amer: 57 mL/min — ABNORMAL LOW (ref >60)
GFR calc non Af Amer: 58 mL/min — ABNORMAL LOW (ref >60)
GFR, EST NON AFRICAN AMERICAN: 49 mL/min — AB (ref >60)
GLUCOSE: 206 mg/dL — AB (ref 65–99)
Glucose, Bld: 218 mg/dL — ABNORMAL HIGH (ref 65–99)
POTASSIUM: 4.2 mmol/L (ref 3.5–5.1)
Potassium: 3.7 mmol/L (ref 3.5–5.1)
SODIUM: 126 mmol/L — AB (ref 135–145)
SODIUM: 130 mmol/L — AB (ref 135–145)

## 2016-05-20 LAB — CORTISOL-AM, BLOOD: CORTISOL - AM: 5.3 ug/dL — AB (ref 6.7–22.6)

## 2016-05-20 LAB — TROPONIN I: Troponin I: <0.03 ng/mL (ref <0.03)

## 2016-05-20 MED ORDER — INSULIN ASPART 100 UNIT/ML ~~LOC~~ SOLN
0.0000 [IU] | Freq: Three times a day (TID) | SUBCUTANEOUS | Status: DC
  Administered 2016-05-21: 3 [IU] via SUBCUTANEOUS
  Administered 2016-05-21: 2 [IU] via SUBCUTANEOUS

## 2016-05-20 MED ORDER — INSULIN ASPART 100 UNIT/ML ~~LOC~~ SOLN
0.0000 [IU] | Freq: Every day | SUBCUTANEOUS | Status: DC
Start: 2016-05-20 — End: 2016-05-21

## 2016-05-20 MED ORDER — IPRATROPIUM-ALBUTEROL 0.5-2.5 (3) MG/3ML IN SOLN
3.0000 mL | Freq: Four times a day (QID) | RESPIRATORY_TRACT | Status: DC
  Administered 2016-05-20 – 2016-05-21 (×6): 3 mL via RESPIRATORY_TRACT
  Filled 2016-05-20 (×7): qty 3

## 2016-05-20 MED ORDER — FUROSEMIDE 10 MG/ML IJ SOLN
40.0000 mg | Freq: Two times a day (BID) | INTRAMUSCULAR | Status: DC
  Administered 2016-05-20: 40 mg via INTRAVENOUS
  Filled 2016-05-20: qty 4

## 2016-05-20 MED ORDER — IPRATROPIUM-ALBUTEROL 0.5-2.5 (3) MG/3ML IN SOLN: 3.0000 mL | Freq: Four times a day (QID) | RESPIRATORY_TRACT | Status: DC | PRN

## 2016-05-20 NOTE — Care Management Obs Status (Signed)
[place] NOTIFICATION   Patient Details  Name: Eric [name] MRN: [id number] Date of Birth: [birth year]   Medicare Observation Status Notification Given:  Yes    MahabirJuliann Pulse, RN 05/20/2016, 4:25 PM

## 2016-05-20 NOTE — Progress Notes (Signed)
Inpatient Diabetes Program Recommendations  AACE/ADA: New Consensus Statement on Inpatient Glycemic Control (2015)  Target Ranges:  Prepandial:   less than 140 mg/dL      Peak postprandial:   less than 180 mg/dL (1-2 hours)      Critically ill patients:  140 - 180 mg/dL  Results for NICKLES, BLACKLEDGE (MRN [id number]) as of 05/20/2016 15:28  Ref. Range 05/19/2016 22:24 05/20/2016 07:31 05/20/2016 11:42  Glucose-Capillary Latest Ref Range: 65 - 99 mg/dL 233 (H) 163 (H) 229 (H)   Review of Glycemic Control  Diabetes history: DM 2 Outpatient Diabetes medications: None Current orders for Inpatient glycemic control: Novolog Sensitive TID  Inpatient Diabetes Program Recommendations:   Patient receiving IV Solumedrol 60 mg Q12 hours. Patient's glucose in the 200's. While on steroids, please consider increasing Novolog Correction to  Moderate TID and add HS scale.  Thanks,  Tama Headings RN, MSN, [facility] Inpatient Diabetes Coordinator Team Pager [phone] (8a-5p)

## 2016-05-20 NOTE — Care Management Note (Signed)
Case Management Note  Patient Details  Name: Eric [name] MRN: [id number] Date of Birth: May 19, 1927  Subjective/Objective:  82 y/o m admitted w/COPD. From indep liv-[place]. PT cons-await recc.                  Action/Plan:d/c home w/HHC.   Expected Discharge Date:   (unknown)               Expected Discharge Plan:  [place]  In-House Referral:  Clinical Social Work  Discharge planning Services  CM Consult  Post Acute Care Choice:    Choice offered to:     DME Arranged:    DME Agency:     HH Arranged:    [place] Agency:     Status of Service:  In process, will continue to follow  If discussed at Long Length of Stay Meetings, dates discussed:    Additional Comments:  Dessa Phi, RN 05/20/2016, 4:24 PM

## 2016-05-20 NOTE — Progress Notes (Addendum)
PROGRESS NOTE  Eric [name]  [id number] DOB: 1927-01-05 DOA: 05/19/2016 PCP: [name], MD  Brief Narrative:   The patient is an 82 year old male with history of CAD status post CABG,, diastolic heart failure, hypertension, COPD, CML, who presented with worsening shortness of breath and cough for approximately 2 days prior to admission. He had some associated chest tightness worse with deep inspiration. In the emergency department, chest x-ray demonstrated mild pulmonary edema. His BNP was normal. He had a negative troponin. His sodium was 131 and he was referred for admission secondary to acute respiratory failure and suspected COPD exacerbation.  Assessment & Plan:   Active Problems:   COPD exacerbation (HCC)   CHF exacerbation (HCC)  Acute COPD exacerbation, RSV POSITIVE -  And any segmental today -  Transition to prednisone tomorrow -  Continue azithromycin -  Start dulera -  Continue duo nebs  Possible acute on chronic diastolic heart failure with coarse rales at the bilateral bases, interstitial edema on chest x-ray -  Start Lasix 40 mg IV twice a day -  Daily weights and strict ins and outs -  Echocardiogram: Mild LVH, preserved ejection fraction, grade 1 diastolic dysfunction -  Troponin negative  Hyponatremia, may be secondary to heart failure exacerbation -  TSH within normal limits -  Cortisol level unreliable given administration of solumedrol -  Repeat BMP in afternoon and again in a.m. -  If not improving with diuresis, will need additional work up with urine sodium and osms  Diabetes mellitus2 with hyperglycemia secondary to steroid administration -  Tapering steroids -  Increase to moderate dose sliding scale insulin and add daily at bedtime insulin -  A1 pending  Hypertension; controlled.   CAD:s/p CABG rule out ACS.   CML, WBC and anemia near baseline   DVT prophylaxis:  Lovenox Code Status:  Full code Family Communication:   Patient alone Disposition Plan:  Home in a few days, awaiting improvement in sodium and respiratory distress   Consultants:   None  Procedures:  Echocardiogram  Antimicrobials:  Anti-infectives    Start     Dose/Rate Route Frequency Ordered Stop   05/19/16 1600  azithromycin (ZITHROMAX) tablet 500 mg     500 mg Oral Daily 05/19/16 1421 05/22/16 0959           Subjective: Feeling a little bit better. The chest tightness has improved somewhat and he feels that his breathing is easier. It is not back to baseline. He still has to sit at the edge of the bed upright because he gets Masud Holub of breath when he lies down. Denies lower extremity edema. May have had some recent abdominal bloating.  Objective: Vitals:   05/20/16 0753 05/20/16 1046 05/20/16 1150 05/20/16 1329  BP:  124/65  (!) 146/63  Pulse:  78  75  Resp:    19  Temp:    97.9 F (36.6 C)  TempSrc:    Oral  SpO2: 93%  95% 94%  Weight:      Height:        Intake/Output Summary (Last 24 hours) at 05/20/16 1759 Last data filed at 05/20/16 1329  Gross per 24 hour  Intake              480 ml  Output              403 ml  Net               77 ml  Filed Weights   05/19/16 1337 05/19/16 1415 05/20/16 0617  Weight: 99.6 kg (219 lb 9.3 oz) 98.9 kg (218 lb) 98.8 kg (217 lb 13 oz)    Examination:  General exam:  Adult Male, sitting upright at the edge of the bed.  No acute distress.  HEENT:  NCAT, MMM Respiratory system: Coarse rales at the bilateral bases, scattered wheeze, relatively good aeration, positive rhonchi Cardiovascular system: Regular rate and rhythm, normal S1/S2. No murmurs, rubs, gallops or clicks.  Warm extremities Gastrointestinal system: Normal active bowel sounds, soft, colostomy with soft stool, large bilateral abdominal wall hernias MSK:  Normal tone and bulk, no lower extremity edema Neuro:  Grossly intact    Data Reviewed: I have personally reviewed following labs and imaging  studies  CBC:  Recent Labs Lab 05/19/16 1001  WBC 3.1*  NEUTROABS 1.9  HGB 9.3*  HCT 28.4*  MCV 99.6  PLT 123XX123   Basic Metabolic Panel:  Recent Labs Lab 05/19/16 1126 05/20/16 0203  NA 131* 126*  K 3.9 4.2  CL 100* 97*  CO2 22 18*  GLUCOSE 162* 218*  BUN 15 21*  CREATININE 1.13 1.10  CALCIUM 9.2 9.0   GFR: Estimated Creatinine Clearance: 52.1 mL/min (by C-G formula based on SCr of 1.1 mg/dL). Liver Function Tests: No results for input(s): AST, ALT, ALKPHOS, BILITOT, PROT, ALBUMIN in the last 168 hours. No results for input(s): LIPASE, AMYLASE in the last 168 hours. No results for input(s): AMMONIA in the last 168 hours. Coagulation Profile: No results for input(s): INR, PROTIME in the last 168 hours. Cardiac Enzymes:  Recent Labs Lab 05/19/16 1504 05/19/16 2020 05/20/16 0203  TROPONINI <0.03 <0.03 <0.03   BNP (last 3 results)  Recent Labs  05/27/15 1512 11/27/15 1621  PROBNP 80.0 296.0*   HbA1C: No results for input(s): HGBA1C in the last 72 hours. CBG:  Recent Labs Lab 05/19/16 2224 05/20/16 0731 05/20/16 1142 05/20/16 1655  GLUCAP 233* 163* 229* 193*   Lipid Profile: No results for input(s): CHOL, HDL, LDLCALC, TRIG, CHOLHDL, LDLDIRECT in the last 72 hours. Thyroid Function Tests:  Recent Labs  05/19/16 1126  TSH 1.754   Anemia Panel: No results for input(s): VITAMINB12, FOLATE, FERRITIN, TIBC, IRON, RETICCTPCT in the last 72 hours. Urine analysis:    Component Value Date/Time   COLORURINE YELLOW 12/07/2014 2006   APPEARANCEUR CLEAR 12/07/2014 2006   LABSPEC 1.009 12/07/2014 2006   PHURINE 7.5 12/07/2014 2006   GLUCOSEU NEGATIVE 12/07/2014 2006   GLUCOSEU NEGATIVE 02/09/2014 0953   HGBUR NEGATIVE 12/07/2014 2006   BILIRUBINUR NEGATIVE 12/07/2014 2006   [place] 12/07/2014 2006   PROTEINUR NEGATIVE 12/07/2014 2006   UROBILINOGEN 0.2 12/07/2014 2006   NITRITE NEGATIVE 12/07/2014 2006   LEUKOCYTESUR NEGATIVE  12/07/2014 2006   Sepsis Labs: @LABRCNTIP (procalcitonin:4,lacticidven:4)  ) Recent Results (from the past 240 hour(s))  Respiratory Panel by PCR     Status: Abnormal   Collection Time: 05/19/16  2:03 PM  Result Value Ref Range Status   Adenovirus NOT DETECTED NOT DETECTED Final   Coronavirus 229E NOT DETECTED NOT DETECTED Final   Coronavirus HKU1 NOT DETECTED NOT DETECTED Final   Coronavirus NL63 NOT DETECTED NOT DETECTED Final   Coronavirus OC43 NOT DETECTED NOT DETECTED Final   Metapneumovirus NOT DETECTED NOT DETECTED Final   Rhinovirus / Enterovirus NOT DETECTED NOT DETECTED Final   Influenza A NOT DETECTED NOT DETECTED Final   Influenza B NOT DETECTED NOT DETECTED Final   Parainfluenza Virus 1  DETECTED (A) NOT DETECTED Final   Parainfluenza Virus 2 NOT DETECTED NOT DETECTED Final   Parainfluenza Virus 3 NOT DETECTED NOT DETECTED Final   Parainfluenza Virus 4 NOT DETECTED NOT DETECTED Final   Respiratory Syncytial Virus DETECTED (A) NOT DETECTED Final    Comment: CRITICAL RESULT CALLED TO, READ BACK BY AND VERIFIED WITH: Arna Snipe RN 17:50 05/19/16 ([email or web address])    Bordetella pertussis NOT DETECTED NOT DETECTED Final   Chlamydophila pneumoniae NOT DETECTED NOT DETECTED Final   Mycoplasma pneumoniae NOT DETECTED NOT DETECTED Final    Comment: Performed at [facility]      Radiology Studies: Dg Chest 2 View  Result Date: 05/19/2016 CLINICAL DATA:  Several days of chest congestion and cough with some chest pain and shortness of breath. History of COPD, ischemic cardiomyopathy, diabetes, former smoker. EXAM: CHEST  2 VIEW COMPARISON:  PA and lateral chest x-ray of December 01, 2015 FINDINGS: The lungs are well-expanded. The interstitial markings are coarse. There is no alveolar infiltrate or pleural effusion. The cardiac silhouette is enlarged. The central pulmonary vascularity is mildly prominent but there is no cephalization. There is calcification in the wall of the thoracic  aorta. There are post CABG changes. The ICD is in stable position. There is multilevel degenerative disc disease of the thoracic spine. IMPRESSION: COPD. No focal pneumonia. Cardiomegaly with mild central pulmonary vascular congestion. No definite pulmonary edema. Thoracic aortic atherosclerosis. Electronically Signed   By: David  Martinique M.D.   On: 05/19/2016 09:49     Scheduled Meds: . aspirin EC  81 mg Oral QPM  . azithromycin  500 mg Oral Daily  . enoxaparin (LOVENOX) injection  40 mg Subcutaneous Q24H  . famotidine  20 mg Oral Daily  . furosemide  40 mg Intravenous BID  . [START ON 102/07/2015] insulin aspart  0-15 Units Subcutaneous TID WC  . insulin aspart  0-5 Units Subcutaneous QHS  . ipratropium-albuterol  3 mL Nebulization QID  . losartan  25 mg Oral Daily  . methylPREDNISolone (SOLU-MEDROL) injection  60 mg Intravenous Q12H  . pravastatin  60 mg Oral Daily  . sodium chloride flush  3 mL Intravenous Q12H   Continuous Infusions:   LOS: 0 days    Time spent: 30 min    Janece Canterbury, MD [facility] Pager [phone]  If 7PM-7AM, please contact night-coverage [email or web address] Password [email or web address] 05/20/2016, 5:59 PM

## 2016-05-21 ENCOUNTER — Telehealth: Payer: Self-pay | Admitting: *Deleted

## 2016-05-21 ENCOUNTER — Other Ambulatory Visit: Payer: Self-pay | Admitting: *Deleted

## 2016-05-21 DIAGNOSIS — D471 Chronic myeloproliferative disease: Secondary | ICD-10-CM

## 2016-05-21 DIAGNOSIS — J210 Acute bronchiolitis due to respiratory syncytial virus: Secondary | ICD-10-CM

## 2016-05-21 DIAGNOSIS — B348 Other viral infections of unspecified site: Secondary | ICD-10-CM

## 2016-05-21 LAB — CBC
HCT: 28.1 % — ABNORMAL LOW (ref 39.0–52.0)
Hemoglobin: 9.1 g/dL — ABNORMAL LOW (ref 13.0–17.0)
MCH: 32 pg (ref 26.0–34.0)
MCHC: 32.4 g/dL (ref 30.0–36.0)
MCV: 98.9 fL (ref 78.0–100.0)
PLATELETS: 165 10*3/uL (ref 150–400)
RBC: 2.84 MIL/uL — AB (ref 4.22–5.81)
RDW: 14.9 % (ref 11.5–15.5)
WBC: 10.3 10*3/uL (ref 4.0–10.5)

## 2016-05-21 LAB — BASIC METABOLIC PANEL
Anion gap: 9 (ref 5–15)
BUN: 33 mg/dL — ABNORMAL HIGH (ref 6–20)
CALCIUM: 9.3 mg/dL (ref 8.9–10.3)
CO2: 23 mmol/L (ref 22–32)
CREATININE: 1.28 mg/dL — AB (ref 0.61–1.24)
Chloride: 99 mmol/L — ABNORMAL LOW (ref 101–111)
GFR calc non Af Amer: 49 mL/min — ABNORMAL LOW (ref >60)
GFR, EST AFRICAN AMERICAN: 56 mL/min — AB (ref >60)
Glucose, Bld: 174 mg/dL — ABNORMAL HIGH (ref 65–99)
Potassium: 4.3 mmol/L (ref 3.5–5.1)
SODIUM: 131 mmol/L — AB (ref 135–145)

## 2016-05-21 LAB — GLUCOSE, CAPILLARY
GLUCOSE-CAPILLARY: 154 mg/dL — AB (ref 65–99)
Glucose-Capillary: 136 mg/dL — ABNORMAL HIGH (ref 65–99)

## 2016-05-21 LAB — HEMOGLOBIN A1C
HEMOGLOBIN A1C: 5.8 % — AB (ref 4.8–5.6)
MEAN PLASMA GLUCOSE: 120 mg/dL

## 2016-05-21 MED ORDER — PREDNISONE 50 MG PO TABS: 50.0000 mg | ORAL_TABLET | Freq: Every day | ORAL | 0 refills | Status: AC

## 2016-05-21 MED ORDER — PREDNISONE 20 MG PO TABS
60.0000 mg | ORAL_TABLET | Freq: Every day | ORAL | Status: DC
  Administered 2016-05-21: 60 mg via ORAL
  Filled 2016-05-21: qty 3

## 2016-05-21 MED ORDER — FUROSEMIDE 20 MG PO TABS: 20.0000 mg | ORAL_TABLET | Freq: Every day | ORAL | 0 refills | Status: DC

## 2016-05-21 MED ORDER — DASATINIB 70 MG PO TABS: 70.0000 mg | ORAL_TABLET | Freq: Every day | ORAL | 2 refills | Status: DC

## 2016-05-21 NOTE — Telephone Encounter (Signed)
Pt was on TCM list admitted for COPD/ SOB/COUGH sxs. D/C 12/14, ans was advise to f/u w/Dr. Alvy Bimler in 2 weeks for hops f/u. Pt has his regular 6 month f/w w/Dr. Sharlet Salina 06/09/16...Eric [name]

## 2016-05-21 NOTE — Discharge Summary (Addendum)
Physician Discharge Summary  Eric [name] [id number] DOB: Jul 10, 1926 DOA: 05/19/2016  PCP: [name], MD  Admit date: 05/19/2016 Discharge date: 05/22/2016  Admitted From: home  Disposition:  home  Recommendations for Outpatient Follow-up:  1. Follow up with Dr. Alvy Bimler in 2 weeks 2. Please repeat BMP and CBC in 1 week, follow up potassium and creatinine 3. Changed HCTZ to lasix per patient request 4. Patient advised to hold his dasatinib until after [place]:  none  Equipment/Devices:  O2 2L with exertion  Discharge Condition:  Stable, improved CODE STATUS:  full  Diet recommendation:  Low-sodium diet  Brief/Interim Summary:  The patient is an 82 year old male with history of CAD status post CABG,, diastolic heart failure, hypertension, COPD, CML, who presented with worsening shortness of breath and cough for approximately 2 days prior to admission. He had some associated chest tightness worse with deep inspiration. In the emergency department, chest x-ray demonstrated mild pulmonary edema. His BNP was normal. He had a negative troponin. His sodium was 131 and he was referred for admission secondary to acute respiratory failure and suspected COPD exacerbation.  He was started on steroids, DuoNeb's, and azithromycin for COPD exacerbation. His dasatinib was held. His respiratory viral panel became positive for RSV and parainfluenza virus 1. I suspect that he had a viral infection that triggered an acute COPD exacerbation and will take several weeks to gradually recover. He was transitioned to oral prednisone and he completed 1.5 g of azithromycin. He was treated for possible acute on chronic diastolic heart failure because he had some rales on his initial exam and possible interstitial edema on his chest x-ray. He was given 24 hours of IV Lasix, however his BUN and creatinine started to rise. For this reason, I suspect that the findings on exam and x-ray were  more likely secondary to viral infection.  Discharge Diagnoses:  Principal Problem:   COPD exacerbation ([place]) Active Problems:   Hyponatremia   Myeloproliferative neoplasm (HCC)   Anemia in neoplastic disease   CHF exacerbation (HCC)   CHF (congestive heart failure) (HCC)   Acute respiratory failure with hypoxia (HCC)  Acute respiratory failure with hypoxia secondary to acute COPD exacerbation triggered by RSV and parainfluenza virus 1 POSITIVE.   -   recommend prednisone burst -   completed azithromycin -  Continue Combivent at home -  Home O2  2L with exertion  Acute on chronic diastolic heart failure, less likely.  Coarse rales at the bilateral bases, interstitial edema on chest x-ray more likely due to viral infection.   -  Given Lasix 40 mg IV twice but creatinine quickly rose -  Echocardiogram: Mild LVH, preserved ejection fraction, grade 1 diastolic dysfunction -  Troponin negative -  Per patient request, changed his HCTZ to low dose lasix at discharge.    Hyponatremia, may be secondary to pulmonary infection but acute on chronic diastolic heart failure also possible -  TSH within normal limits -  Cortisol level unreliable given administration of solumedrol -  sodium trended up with diuresis  Diabetes mellitus2 with hyperglycemia secondary to steroid administration -  Prednisone burst -  A1 5.8  Hypertension; controlled.   CAD:s/p CABG rule out ACS.   CML, WBC and anemia near baseline.  Advised to hold his dasatinib and resume after 12/25.    Anemia due to malignancy.  hgb near baseline  Discharge Instructions  Discharge Instructions    Call MD for:  difficulty breathing, headache or  visual disturbances    Complete by:  As directed    Call MD for:  extreme fatigue    Complete by:  As directed    Call MD for:  hives    Complete by:  As directed    Call MD for:  persistant dizziness or light-headedness    Complete by:  As directed    Call MD for:   persistant nausea and vomiting    Complete by:  As directed    Call MD for:  severe uncontrolled pain    Complete by:  As directed    Call MD for:  temperature >100.4    Complete by:  As directed    Diet - low sodium heart healthy    Complete by:  As directed    Discharge instructions    Complete by:  As directed    Please use oxygen 2L when you are up and walking.  You do not need to use it when you are at rest.  Please have your doctor check your bloodwork again in 2 weeks.  You may STOP your dasatinib until after 12/25.   Increase activity slowly    Complete by:  As directed        Medication List    STOP taking these medications   hydrochlorothiazide 12.5 MG capsule Commonly known as:  MICROZIDE   hydrochlorothiazide 25 MG tablet Commonly known as:  HYDRODIURIL     TAKE these medications   acetaminophen 500 MG tablet Commonly known as:  TYLENOL Take 1,000 mg by mouth every 6 (six) hours as needed for mild pain or fever.   albuterol-ipratropium 18-103 MCG/ACT inhaler Commonly known as:  COMBIVENT Inhale 1 puff into the lungs every 6 (six) hours as needed for wheezing or shortness of breath.   aspirin 81 MG tablet Take 81 mg by mouth every evening.   dasatinib 70 MG tablet Commonly known as:  SPRYCEL Take 1 tablet (70 mg total) by mouth daily.   fluticasone 50 MCG/ACT nasal spray Commonly known as:  FLONASE Place 2 sprays into both nostrils daily. Allergies What changed:  when to take this  reasons to take this  additional instructions   furosemide 20 MG tablet Commonly known as:  LASIX Take 1 tablet (20 mg total) by mouth daily.   HYDROcodone-acetaminophen 7.5-325 MG tablet Commonly known as:  NORCO Take 1 tablet by mouth every 6 (six) hours as needed for moderate pain.   nitroGLYCERIN 0.4 MG SL tablet Commonly known as:  NITROSTAT Place 0.4 mg under the tongue every 5 (five) minutes as needed for chest pain. Reported on 12/12/2015   ONE TOUCH ULTRA  TEST test strip Generic drug:  glucose blood USE TO CHECK BLOOD SUGARS ONCE A DAY   pravastatin 20 MG tablet Commonly known as:  PRAVACHOL Take 3 tablets (60 mg total) by mouth daily.   predniSONE 50 MG tablet Commonly known as:  DELTASONE Take 1 tablet (50 mg total) by mouth daily with breakfast.   PROVENTIL HFA 108 (90 Base) MCG/ACT inhaler Generic drug:  albuterol Inhale 2 puffs into the lungs every 4 (four) hours as needed for shortness of breath. Wheezing   albuterol (2.5 MG/3ML) 0.083% nebulizer solution Commonly known as:  PROVENTIL USE 1 VIAL WITH NEBULIZER EVERY 4 HOURS AS NEEDED FOR WHEEZING   ranitidine 300 MG tablet Commonly known as:  ZANTAC Take 300 mg by mouth at bedtime.   SYSTANE 0.4-0.3 % Soln Generic drug:  Polyethyl Glycol-Propyl Glycol  Apply 2 drops to eye 3 (three) times daily as needed (dry eyes).   triamcinolone cream 0.1 % Commonly known as:  KENALOG Apply 1 application topically 3 (three) times daily as needed (dry skin.).   valsartan 160 MG tablet Commonly known as:  DIOVAN Take 1 tablet (160 mg total) by mouth daily.      Follow-up Information    [name], MD. Schedule an appointment as soon as possible for a visit in 2 week(s).   Specialty:  Internal Medicine Contact information: [place] [address] [phone]        Heath Lark, MD Follow up.   Specialty:  Hematology and Oncology Why:  routine CML follow up Contact information: [place] [id number]          Allergies  Allergen Reactions  . Actos [Pioglitazone Hydrochloride] Other (See Comments)    "felt funny, drowsy, and weak":  . Celebrex [Celecoxib] Other (See Comments)    "felt funny"  . Demerol Palpitations and Other (See Comments)    Increased BP  . Morphine And Related Nausea And Vomiting  . Ciprofloxacin Other (See Comments)    arthralgia  . Metformin Nausea And Vomiting  . Zocor  [Simvastatin] Other (See Comments)    Makes pt very drowsy    Consultations: None    Procedures/Studies: Dg Chest 2 View  Result Date: 05/19/2016 CLINICAL DATA:  Several days of chest congestion and cough with some chest pain and shortness of breath. History of COPD, ischemic cardiomyopathy, diabetes, former smoker. EXAM: CHEST  2 VIEW COMPARISON:  PA and lateral chest x-ray of December 01, 2015 FINDINGS: The lungs are well-expanded. The interstitial markings are coarse. There is no alveolar infiltrate or pleural effusion. The cardiac silhouette is enlarged. The central pulmonary vascularity is mildly prominent but there is no cephalization. There is calcification in the wall of the thoracic aorta. There are post CABG changes. The ICD is in stable position. There is multilevel degenerative disc disease of the thoracic spine. IMPRESSION: COPD. No focal pneumonia. Cardiomegaly with mild central pulmonary vascular congestion. No definite pulmonary edema. Thoracic aortic atherosclerosis. Electronically Signed   By: David  Martinique M.D.   On: 05/19/2016 09:49  Echocardiogram: Left ventricle: The cavity size was normal. Wall thickness was   increased in a pattern of mild LVH. Systolic function was normal.   The estimated ejection fraction was in the range of 55% to 65%.   Wall motion was normal; there were no regional wall motion   abnormalities. Doppler parameters are consistent with abnormal   left ventricular relaxation (grade 1 diastolic dysfunction). - Aortic valve: Mildly calcified annulus. Mildly calcified   leaflets. Valve area (VTI): 1.9 cm^2. Valve area (Vmax): 1.88   cm^2. Valve area (Vmean): 1.71 cm^2.  Subjective:  Feeling better and would like to go home. States that he still feels wheezy, however his breathing has improved. He still has a cough. Denies fevers and chills.  Discharge Exam: Vitals:   05/21/16 0520 05/21/16 1402  BP: 134/67 (!) 143/60  Pulse: 80 93  Resp: 20 18   Temp: 98.8 F (37.1 C) 98.6 F (37 C)   Vitals:   05/21/16 0520 05/21/16 0803 05/21/16 1146 05/21/16 1402  BP: 134/67   (!) 143/60  Pulse: 80   93  Resp: 20   18  Temp: 98.8 F (37.1 C)   98.6 F (37 C)  TempSrc: Oral   Oral  SpO2: 93%  94% 93% 95%  Weight: 98.2 kg (216 lb 7.9 oz)     Height:        General exam:  Adult Male, lying down in bed, 30 angle without respiratory distress HEENT:  NCAT, MMM Respiratory system: Rales have resolved, persistent wheeze, relatively good aeration, positive rhonchi Cardiovascular system: Regular rate and rhythm, normal S1/S2. No murmurs, rubs, gallops or clicks.  Warm extremities Gastrointestinal system: Normal active bowel sounds, soft, colostomy with soft stool, large bilateral abdominal wall hernias MSK:  Normal tone and bulk, no lower extremity edema Neuro:  Grossly intact    The results of significant diagnostics from this hospitalization (including imaging, microbiology, ancillary and laboratory) are listed below for reference.     Microbiology: Recent Results (from the past 240 hour(s))  Respiratory Panel by PCR     Status: Abnormal   Collection Time: 05/19/16  2:03 PM  Result Value Ref Range Status   Adenovirus NOT DETECTED NOT DETECTED Final   Coronavirus 229E NOT DETECTED NOT DETECTED Final   Coronavirus HKU1 NOT DETECTED NOT DETECTED Final   Coronavirus NL63 NOT DETECTED NOT DETECTED Final   Coronavirus OC43 NOT DETECTED NOT DETECTED Final   Metapneumovirus NOT DETECTED NOT DETECTED Final   Rhinovirus / Enterovirus NOT DETECTED NOT DETECTED Final   Influenza A NOT DETECTED NOT DETECTED Final   Influenza B NOT DETECTED NOT DETECTED Final   Parainfluenza Virus 1 DETECTED (A) NOT DETECTED Final   Parainfluenza Virus 2 NOT DETECTED NOT DETECTED Final   Parainfluenza Virus 3 NOT DETECTED NOT DETECTED Final   Parainfluenza Virus 4 NOT DETECTED NOT DETECTED Final   Respiratory Syncytial Virus DETECTED (A) NOT DETECTED Final     Comment: CRITICAL RESULT CALLED TO, READ BACK BY AND VERIFIED WITH: Arna Snipe RN 17:50 05/19/16 ([email or web address])    Bordetella pertussis NOT DETECTED NOT DETECTED Final   Chlamydophila pneumoniae NOT DETECTED NOT DETECTED Final   Mycoplasma pneumoniae NOT DETECTED NOT DETECTED Final    Comment: Performed at [facility]: BNP (last 3 results)  Recent Labs  12/01/15 2040 05/19/16 1001  BNP 538.5* [id number]   Basic Metabolic Panel:  Recent Labs Lab 05/19/16 1126 05/20/16 0203 05/20/16 1822 05/21/16 0526  NA 131* 126* 130* 131*  K 3.9 4.2 3.7 4.3  CL 100* 97* 98* 99*  CO2 22 18* 21* 23  GLUCOSE 162* 218* 206* 174*  BUN 15 21* 28* 33*  CREATININE 1.13 1.10 1.26* 1.28*  CALCIUM 9.2 9.0 9.4 9.3   Liver Function Tests: No results for input(s): AST, ALT, ALKPHOS, BILITOT, PROT, ALBUMIN in the last 168 hours. No results for input(s): LIPASE, AMYLASE in the last 168 hours. No results for input(s): AMMONIA in the last 168 hours. CBC:  Recent Labs Lab 05/19/16 1001 05/21/16 0526  WBC 3.1* 10.3  NEUTROABS 1.9  --   HGB 9.3* 9.1*  HCT 28.4* 28.1*  MCV 99.6 98.9  PLT 187 165   Cardiac Enzymes:  Recent Labs Lab 05/19/16 1504 05/19/16 2020 05/20/16 0203  TROPONINI <0.03 <0.03 <0.03   BNP: Invalid input(s): POCBNP CBG:  Recent Labs Lab 05/20/16 1142 05/20/16 1655 05/20/16 2136 05/21/16 0707 05/21/16 1155  GLUCAP 229* 193* 174* 154* 136*   D-Dimer No results for input(s): DDIMER in the last 72 hours. Hgb A1c  Recent Labs  05/20/16 0203  HGBA1C 5.8*   Lipid Profile No results for input(s): CHOL, HDL, LDLCALC, TRIG, CHOLHDL, LDLDIRECT in the last 72 hours. Thyroid  function studies No results for input(s): TSH, T4TOTAL, T3FREE, THYROIDAB in the last 72 hours.  Invalid input(s): FREET3 Anemia work up No results for input(s): VITAMINB12, FOLATE, FERRITIN, TIBC, IRON, RETICCTPCT in the last 72 hours. Urinalysis    Component Value Date/Time    COLORURINE YELLOW 12/07/2014 2006   APPEARANCEUR CLEAR 12/07/2014 2006   LABSPEC 1.009 12/07/2014 2006   PHURINE 7.5 12/07/2014 2006   GLUCOSEU NEGATIVE 12/07/2014 2006   GLUCOSEU NEGATIVE 02/09/2014 0953   HGBUR NEGATIVE 12/07/2014 2006   [place] NEGATIVE 12/07/2014 2006   [place] 12/07/2014 2006   PROTEINUR NEGATIVE 12/07/2014 2006   UROBILINOGEN 0.2 12/07/2014 2006   NITRITE NEGATIVE 12/07/2014 2006   LEUKOCYTESUR NEGATIVE 12/07/2014 2006   Sepsis Labs Invalid input(s): PROCALCITONIN,  WBC,  LACTICIDVEN   Time coordinating discharge: Over 30 minutes  SIGNED:   Janece Canterbury, MD  [facility] 05/22/2016, 3:39 PM Pager   If 7PM-7AM, please contact night-coverage [email or web address] Password [email or web address]

## 2016-05-21 NOTE — Care Management Note (Signed)
Case Management Note  Patient Details  Name: Eric [name] MRN: [id number] Date of Birth: [birth year]  Subjective/Objective: PT-no f/u.Qualified for home 02,Ordered for home 02 continuous. Attending noted-does not need to use home 02 while @ rest. Spoke to patient in rm-declined to wait for home 02 to be delivered to rm,& wants it delivered to [facility]-informed [facility] dme rep Genevieve Norlander will process, & have delivery to facility. No further CM needs.                    Action/Plan:d/c home.   Expected Discharge Date:   (unknown)               Expected Discharge Plan:  [place]  In-House Referral:  Clinical Social Work  Discharge planning Services  CM Consult  Post Acute Care Choice:    Choice offered to:     DME Arranged:  Oxygen DME Agency:     HH Arranged:    [place] Agency:     Status of Service:  Completed, signed off  If discussed at [employer], dates discussed:    Additional Comments:  Dessa Phi, RN 121-Apr-202017, 4:18 PM

## 2016-05-21 NOTE — Evaluation (Signed)
Physical Therapy Evaluation-1x Patient Details Name: Eric [name] MRN: [id number] DOB: 11-25-1926 Today's Date: 104-02-2016    SATURATION QUALIFICATIONS: (This note is used to comply with regulatory documentation for home oxygen)  Patient Saturations on Room Air at Rest = 96%  Patient Saturations on Room Air while Ambulating = 87%  Patient Saturations on 2 Liters of oxygen while Ambulating = 93%    History of Present Illness  82 yo male admitted with COPD exac. Hx of CABG, HF, COPD leukemia, DM, obesity, prostate cancer  Clinical Impression  On eval, pt was Mod Ind with mobility. He walked x 2 in hallway with a RW. He was observed to walk around room without a device and unassisted as well. Dyspnea 2-3/4 with ambulation. Pt reports he will return to his apt. He declines any HH services. Pt reports he cannot lift/carry anything over 9# and he is concerned about having to manage an O2 tank.  No further PT needs. Will sign off.     Follow Up Recommendations No PT follow up    Equipment Recommendations  None recommended by PT    Recommendations for Other Services       Precautions / Restrictions Precautions Precautions: None Precaution Comments: monitior O2 sats Restrictions Weight Bearing Restrictions: No      Mobility  Bed Mobility               General bed mobility comments: pt oob walking around in room  Transfers Overall transfer level: Modified independent                  Ambulation/Gait Ambulation/Gait assistance: Modified independent (Device/Increase time) Ambulation Distance (Feet): 125 Feet (x2) Assistive device: Rolling walker (2 wheeled) Gait Pattern/deviations: Step-through pattern;Decreased stride length     General Gait Details: Walked x1 on RA-O2 sats 87%, dyspnea 3/4. Walked x1 on 2L-O2 sats 93%, dyspnea 2/4  Stairs            Wheelchair Mobility    Modified Rankin (Stroke Patients Only)       Balance                                              Pertinent Vitals/Pain Pain Assessment: Faces Faces Pain Scale: Hurts even more Pain Location: head Pain Descriptors / Indicators: Aching Pain Intervention(s): Monitored during session    Home Living Family/patient expects to be discharged to:: Private residence Living Arrangements: Alone   Type of Home: Independent living facility Home Access: Level entry     Home Layout: One level Home Equipment: Environmental consultant - 4 wheels      Prior Function Level of Independence: Independent with assistive device(s)         Comments: uses rollator vs cane     Hand Dominance        Extremity/Trunk Assessment   Upper Extremity Assessment Upper Extremity Assessment: Overall WFL for tasks assessed    Lower Extremity Assessment Lower Extremity Assessment: Overall WFL for tasks assessed    Cervical / Trunk Assessment Cervical / Trunk Assessment: Kyphotic  Communication   Communication: HOH  Cognition Arousal/Alertness: Awake/alert Behavior During Therapy: WFL for tasks assessed/performed Overall Cognitive Status: Within Functional Limits for tasks assessed                      General Comments  Exercises     Assessment/Plan    PT Assessment Patent does not need any further PT services (Pt declines HH services)  PT Problem List            PT Treatment Interventions      PT Goals (Current goals can be found in the Care Plan section)  Acute Rehab PT Goals Patient Stated Goal: home PT Goal Formulation: All assessment and education complete, DC therapy    Frequency     Barriers to discharge        Co-evaluation               End of Session Equipment Utilized During Treatment: Oxygen Activity Tolerance: Patient tolerated treatment well Patient left: in chair           Time: [id number] PT Time Calculation (min) (ACUTE ONLY): 21 min   Charges:   PT Evaluation $PT Eval Low Complexity: 1  Procedure     PT G Codes:        Weston Anna, MPT Pager: [phone]

## 2016-05-21 NOTE — Progress Notes (Signed)
Ambulated patient in hall on room air and O2 sats dropped to 87%. Placed on 2L O2 and again ambulated, O2 remained above 93%. MD made aware.  Callie Fielding RN

## 2016-05-22 DIAGNOSIS — J9601 Acute respiratory failure with hypoxia: Secondary | ICD-10-CM

## 2016-05-25 ENCOUNTER — Telehealth: Payer: Self-pay | Admitting: Pharmacist

## 2016-05-25 NOTE — Telephone Encounter (Signed)
Oral Chemotherapy Pharmacist Encounter  Received 1 month supply of Sprycel 70mg  at [facility] infusion pharmacy shipped from [place]. I called [phone] to inquire why medication was sent to MD's office.  Noted Annamary Rummage is in process of re-enrolling patient for manufacturer support from [employer] for his Sprycel. [employer] has shipped medication to [facility] in error, future shipments will be delivered to patient's residence. We are still waiting for final determination for 2018 enrollment.  I called patient to inform him medication is at MD's office. Patient will be by [facility] to pick it up sometime this week, he will call the office to let us know when he is coming to retrieve his medication.  Medication will be stored in locked cabinet above Dr. Calton Dach RN's desk. Patient understands and is in agreement with above plan.  Johny Drilling, PharmD, BCPS, BCOP 05/25/2016  3:48 PM [facility] [phone]

## 2016-06-02 ENCOUNTER — Encounter (HOSPITAL_COMMUNITY): Payer: Self-pay | Admitting: Emergency Medicine

## 2016-06-02 ENCOUNTER — Inpatient Hospital Stay (HOSPITAL_COMMUNITY)
Admission: EM | Admit: 2016-06-02 | Discharge: 2016-06-06 | DRG: 190 | Disposition: A | Discharge status: Home / Self Care | Payer: Medicare Other | Attending: Internal Medicine | Admitting: Internal Medicine

## 2016-06-02 ENCOUNTER — Emergency Department (HOSPITAL_COMMUNITY): Payer: Medicare Other

## 2016-06-02 DIAGNOSIS — E662 Morbid (severe) obesity with alveolar hypoventilation: Secondary | ICD-10-CM | POA: Diagnosis present

## 2016-06-02 DIAGNOSIS — J9621 Acute and chronic respiratory failure with hypoxia: Secondary | ICD-10-CM | POA: Diagnosis present

## 2016-06-02 DIAGNOSIS — R0602 Shortness of breath: Secondary | ICD-10-CM | POA: Diagnosis not present

## 2016-06-02 DIAGNOSIS — Z888 Allergy status to other drugs, medicaments and biological substances status: Secondary | ICD-10-CM

## 2016-06-02 DIAGNOSIS — Z6835 Body mass index (BMI) 35.0-35.9, adult: Secondary | ICD-10-CM

## 2016-06-02 DIAGNOSIS — D638 Anemia in other chronic diseases classified elsewhere: Secondary | ICD-10-CM | POA: Diagnosis present

## 2016-06-02 DIAGNOSIS — E1165 Type 2 diabetes mellitus with hyperglycemia: Secondary | ICD-10-CM | POA: Diagnosis present

## 2016-06-02 DIAGNOSIS — J440 Chronic obstructive pulmonary disease with acute lower respiratory infection: Secondary | ICD-10-CM | POA: Diagnosis present

## 2016-06-02 DIAGNOSIS — I5032 Chronic diastolic (congestive) heart failure: Secondary | ICD-10-CM | POA: Diagnosis present

## 2016-06-02 DIAGNOSIS — Z9981 Dependence on supplemental oxygen: Secondary | ICD-10-CM

## 2016-06-02 DIAGNOSIS — I110 Hypertensive heart disease with heart failure: Secondary | ICD-10-CM | POA: Diagnosis not present

## 2016-06-02 DIAGNOSIS — K219 Gastro-esophageal reflux disease without esophagitis: Secondary | ICD-10-CM | POA: Diagnosis present

## 2016-06-02 DIAGNOSIS — Z881 Allergy status to other antibiotic agents status: Secondary | ICD-10-CM

## 2016-06-02 DIAGNOSIS — J441 Chronic obstructive pulmonary disease with (acute) exacerbation: Principal | ICD-10-CM | POA: Diagnosis present

## 2016-06-02 DIAGNOSIS — C9210 Chronic myeloid leukemia, BCR/ABL-positive, not having achieved remission: Secondary | ICD-10-CM | POA: Diagnosis present

## 2016-06-02 DIAGNOSIS — Y95 Nosocomial condition: Secondary | ICD-10-CM | POA: Diagnosis present

## 2016-06-02 DIAGNOSIS — J189 Pneumonia, unspecified organism: Secondary | ICD-10-CM | POA: Diagnosis present

## 2016-06-02 DIAGNOSIS — J9601 Acute respiratory failure with hypoxia: Secondary | ICD-10-CM | POA: Diagnosis present

## 2016-06-02 DIAGNOSIS — J449 Chronic obstructive pulmonary disease, unspecified: Secondary | ICD-10-CM

## 2016-06-02 DIAGNOSIS — Z87891 Personal history of nicotine dependence: Secondary | ICD-10-CM

## 2016-06-02 DIAGNOSIS — E669 Obesity, unspecified: Secondary | ICD-10-CM | POA: Diagnosis present

## 2016-06-02 DIAGNOSIS — E119 Type 2 diabetes mellitus without complications: Secondary | ICD-10-CM

## 2016-06-02 DIAGNOSIS — D630 Anemia in neoplastic disease: Secondary | ICD-10-CM | POA: Diagnosis present

## 2016-06-02 DIAGNOSIS — Z933 Colostomy status: Secondary | ICD-10-CM

## 2016-06-02 DIAGNOSIS — Z8546 Personal history of malignant neoplasm of prostate: Secondary | ICD-10-CM

## 2016-06-02 DIAGNOSIS — Z885 Allergy status to narcotic agent status: Secondary | ICD-10-CM

## 2016-06-02 DIAGNOSIS — G4733 Obstructive sleep apnea (adult) (pediatric): Secondary | ICD-10-CM | POA: Diagnosis present

## 2016-06-02 DIAGNOSIS — I25810 Atherosclerosis of coronary artery bypass graft(s) without angina pectoris: Secondary | ICD-10-CM | POA: Diagnosis present

## 2016-06-02 DIAGNOSIS — E1151 Type 2 diabetes mellitus with diabetic peripheral angiopathy without gangrene: Secondary | ICD-10-CM | POA: Diagnosis present

## 2016-06-02 DIAGNOSIS — Z951 Presence of aortocoronary bypass graft: Secondary | ICD-10-CM

## 2016-06-02 DIAGNOSIS — I509 Heart failure, unspecified: Secondary | ICD-10-CM | POA: Diagnosis not present

## 2016-06-02 DIAGNOSIS — R05 Cough: Secondary | ICD-10-CM | POA: Diagnosis not present

## 2016-06-02 DIAGNOSIS — Z950 Presence of cardiac pacemaker: Secondary | ICD-10-CM

## 2016-06-02 DIAGNOSIS — Z8249 Family history of ischemic heart disease and other diseases of the circulatory system: Secondary | ICD-10-CM

## 2016-06-02 LAB — CBC
HCT: 26.2 % — ABNORMAL LOW (ref 39.0–52.0)
HEMATOCRIT: 28.4 % — AB (ref 39.0–52.0)
HEMOGLOBIN: 8.5 g/dL — AB (ref 13.0–17.0)
HEMOGLOBIN: 9.3 g/dL — AB (ref 13.0–17.0)
MCH: 32.4 pg (ref 26.0–34.0)
MCH: 32.6 pg (ref 26.0–34.0)
MCHC: 32.4 g/dL (ref 30.0–36.0)
MCHC: 32.7 g/dL (ref 30.0–36.0)
MCV: 100 fL (ref 78.0–100.0)
MCV: 99.6 fL (ref 78.0–100.0)
PLATELETS: 170 10*3/uL (ref 150–400)
Platelets: 178 10*3/uL (ref 150–400)
RBC: 2.62 MIL/uL — ABNORMAL LOW (ref 4.22–5.81)
RBC: 2.85 MIL/uL — AB (ref 4.22–5.81)
RDW: 15 % (ref 11.5–15.5)
RDW: 14.9 % (ref 11.5–15.5)
WBC: 7.3 10*3/uL (ref 4.0–10.5)
WBC: 6.7 10*3/uL (ref 4.0–10.5)

## 2016-06-02 LAB — GLUCOSE, CAPILLARY
Glucose-Capillary: 198 mg/dL — ABNORMAL HIGH (ref 65–99)
Glucose-Capillary: 144 mg/dL — ABNORMAL HIGH (ref 65–99)

## 2016-06-02 LAB — BASIC METABOLIC PANEL
ANION GAP: 6 (ref 5–15)
BUN: 13 mg/dL (ref 6–20)
CALCIUM: 9.3 mg/dL (ref 8.9–10.3)
CO2: 26 mmol/L (ref 22–32)
Chloride: 104 mmol/L (ref 101–111)
Creatinine, Ser: 1.14 mg/dL (ref 0.61–1.24)
GFR calc Af Amer: >60 mL/min (ref >60)
GFR, EST NON AFRICAN AMERICAN: 56 mL/min — AB (ref >60)
GLUCOSE: 138 mg/dL — AB (ref 65–99)
Potassium: 3.9 mmol/L (ref 3.5–5.1)
SODIUM: 136 mmol/L (ref 135–145)

## 2016-06-02 LAB — CREATININE, SERUM
Creatinine, Ser: 1.08 mg/dL (ref 0.61–1.24)
GFR calc non Af Amer: 60 mL/min — ABNORMAL LOW (ref >60)

## 2016-06-02 LAB — BRAIN NATRIURETIC PEPTIDE: B Natriuretic Peptide: 112.4 pg/mL — ABNORMAL HIGH (ref 0.0–100.0)

## 2016-06-02 LAB — I-STAT TROPONIN, ED: TROPONIN I, POC: 0.03 ng/mL (ref 0.00–0.08)

## 2016-06-02 MED ORDER — TRIAMCINOLONE ACETONIDE 0.1 % EX CREA: 1.0000 "application " | TOPICAL_CREAM | Freq: Three times a day (TID) | CUTANEOUS | Status: DC | PRN

## 2016-06-02 MED ORDER — HYDROCODONE-ACETAMINOPHEN 7.5-325 MG PO TABS
1.0000 | ORAL_TABLET | Freq: Four times a day (QID) | ORAL | Status: DC | PRN
  Administered 2016-06-02: 1 via ORAL
  Filled 2016-06-02: qty 1

## 2016-06-02 MED ORDER — IRBESARTAN 150 MG PO TABS
150.0000 mg | ORAL_TABLET | Freq: Every day | ORAL | Status: DC
  Administered 2016-06-02 – 2016-06-06 (×5): 150 mg via ORAL
  Filled 2016-06-02 (×5): qty 1

## 2016-06-02 MED ORDER — HYDRALAZINE HCL 20 MG/ML IJ SOLN
5.0000 mg | Freq: Four times a day (QID) | INTRAMUSCULAR | Status: DC | PRN
  Administered 2016-06-02: 5 mg via INTRAVENOUS
  Filled 2016-06-02: qty 1

## 2016-06-02 MED ORDER — FUROSEMIDE 20 MG PO TABS
20.0000 mg | ORAL_TABLET | Freq: Every day | ORAL | Status: DC
  Administered 2016-06-02 – 2016-06-06 (×5): 20 mg via ORAL
  Filled 2016-06-02 (×5): qty 1

## 2016-06-02 MED ORDER — METHYLPREDNISOLONE SODIUM SUCC 125 MG IJ SOLR
60.0000 mg | Freq: Two times a day (BID) | INTRAMUSCULAR | Status: DC
  Administered 2016-06-02 – 2016-06-04 (×4): 60 mg via INTRAVENOUS
  Filled 2016-06-02 (×4): qty 2

## 2016-06-02 MED ORDER — POLYETHYL GLYCOL-PROPYL GLYCOL 0.4-0.3 % OP SOLN: 2.0000 [drp] | Freq: Three times a day (TID) | OPHTHALMIC | Status: DC | PRN

## 2016-06-02 MED ORDER — GUAIFENESIN-DM 100-10 MG/5ML PO SYRP
5.0000 mL | ORAL_SOLUTION | ORAL | Status: DC | PRN
  Administered 2016-06-02 – 2016-06-05 (×3): 5 mL via ORAL
  Filled 2016-06-02 (×4): qty 10

## 2016-06-02 MED ORDER — SODIUM CHLORIDE 0.9% FLUSH
3.0000 mL | Freq: Two times a day (BID) | INTRAVENOUS | Status: DC
  Administered 2016-06-03 – 2016-06-04 (×3): 3 mL via INTRAVENOUS

## 2016-06-02 MED ORDER — ENOXAPARIN SODIUM 40 MG/0.4ML ~~LOC~~ SOLN
40.0000 mg | SUBCUTANEOUS | Status: DC
  Administered 2016-06-02 – 2016-06-05 (×4): 40 mg via SUBCUTANEOUS
  Filled 2016-06-02 (×4): qty 0.4

## 2016-06-02 MED ORDER — IPRATROPIUM-ALBUTEROL 0.5-2.5 (3) MG/3ML IN SOLN
3.0000 mL | Freq: Four times a day (QID) | RESPIRATORY_TRACT | Status: DC
  Administered 2016-06-02 – 2016-06-06 (×17): 3 mL via RESPIRATORY_TRACT
  Filled 2016-06-02 (×17): qty 3

## 2016-06-02 MED ORDER — PRAVASTATIN SODIUM 40 MG PO TABS
60.0000 mg | ORAL_TABLET | Freq: Every day | ORAL | Status: DC
  Administered 2016-06-02 – 2016-06-06 (×5): 60 mg via ORAL
  Filled 2016-06-02 (×5): qty 1

## 2016-06-02 MED ORDER — NITROGLYCERIN 0.4 MG SL SUBL: 0.4000 mg | SUBLINGUAL_TABLET | SUBLINGUAL | Status: DC | PRN

## 2016-06-02 MED ORDER — FLUTICASONE PROPIONATE 50 MCG/ACT NA SUSP
2.0000 | Freq: Every day | NASAL | Status: DC
  Administered 2016-06-03 – 2016-06-06 (×4): 2 via NASAL
  Filled 2016-06-02: qty 16

## 2016-06-02 MED ORDER — ASPIRIN EC 81 MG PO TBEC
81.0000 mg | DELAYED_RELEASE_TABLET | Freq: Every evening | ORAL | Status: DC
  Administered 2016-06-02 – 2016-06-05 (×4): 81 mg via ORAL
  Filled 2016-06-02 (×4): qty 1

## 2016-06-02 MED ORDER — POLYVINYL ALCOHOL 1.4 % OP SOLN
1.0000 [drp] | OPHTHALMIC | Status: DC | PRN
  Administered 2016-06-03: 1 [drp] via OPHTHALMIC
  Filled 2016-06-02 (×2): qty 15

## 2016-06-02 MED ORDER — INSULIN ASPART 100 UNIT/ML ~~LOC~~ SOLN
0.0000 [IU] | Freq: Every day | SUBCUTANEOUS | Status: DC
  Administered 2016-06-04: 2 [IU] via SUBCUTANEOUS

## 2016-06-02 MED ORDER — LEVOFLOXACIN IN D5W 500 MG/100ML IV SOLN
500.0000 mg | INTRAVENOUS | Status: DC
  Administered 2016-06-03 – 2016-06-04 (×2): 500 mg via INTRAVENOUS
  Filled 2016-06-02 (×2): qty 100

## 2016-06-02 MED ORDER — INSULIN ASPART 100 UNIT/ML ~~LOC~~ SOLN
0.0000 [IU] | Freq: Three times a day (TID) | SUBCUTANEOUS | Status: DC
Start: 2016-06-02 — End: 2016-06-06
  Administered 2016-06-02 – 2016-06-04 (×4): 2 [IU] via SUBCUTANEOUS
  Administered 2016-06-04: 3 [IU] via SUBCUTANEOUS
  Administered 2016-06-05 (×3): 2 [IU] via SUBCUTANEOUS
  Administered 2016-06-06: 1 [IU] via SUBCUTANEOUS
  Administered 2016-06-06: 3 [IU] via SUBCUTANEOUS

## 2016-06-02 MED ORDER — ALBUTEROL SULFATE (2.5 MG/3ML) 0.083% IN NEBU: 2.5000 mg | INHALATION_SOLUTION | RESPIRATORY_TRACT | Status: DC | PRN

## 2016-06-02 MED ORDER — LEVOFLOXACIN IN D5W 750 MG/150ML IV SOLN
750.0000 mg | Freq: Once | INTRAVENOUS | Status: AC
  Administered 2016-06-02: 750 mg via INTRAVENOUS
  Filled 2016-06-02: qty 150

## 2016-06-02 MED ORDER — ALBUTEROL SULFATE (2.5 MG/3ML) 0.083% IN NEBU
5.0000 mg | INHALATION_SOLUTION | Freq: Once | RESPIRATORY_TRACT | Status: AC
  Administered 2016-06-02: 5 mg via RESPIRATORY_TRACT
  Filled 2016-06-02: qty 6

## 2016-06-02 NOTE — Progress Notes (Signed)
Patient brought to ED on Cpap. Breath sounds bilaterally are rhonchus. Placed him on 2L nasal cannula SPo2 is 98% patients distress seems to have resolved for now. Will continue to monitor his breathing.

## 2016-06-02 NOTE — ED Notes (Signed)
Patient transported to X-ray 

## 2016-06-02 NOTE — ED Triage Notes (Signed)
Pt with worsening shortness of breath and R chest pain. Pt is normally on 2L [place] at home but has not been on O2 when EMS arrived. Pt arrived by EMS on bipap and had been given 2 Duonebs and 125 mg Solu Medrol. Pt taken off Bipap and placed on [place], productive cough.

## 2016-06-02 NOTE — ED Notes (Signed)
Bed: [id number] Expected date:  Expected time:  Means of arrival:  Comments: EMS, SOB

## 2016-06-02 NOTE — H&P (Signed)
History and Physical   Eric [name] [id number] DOB: June 22, 1926 DOA: 06/02/2016  Referring MD/NP/PA: Dr. Johnney Killian, [place] PCP: [name], MD [facility]  Patient coming from: Home  Chief Complaint: Dyspnea and cough  HPI: Eric [name] is a 82 y.o. male with a history of CAD s/p CABG, chronic diastolic CHF, COPD on 2L at home, OSA, CML, HTN, diverticulitis s/p colostomy, who presented 12/26 for worsening shortness of breath and productive cough.   He was recently discharged after an admission for COPD exacerbation triggered by RSV and parainfluenza viral infections. He completed prednisone, continued lasix, and was sent with home oxygen but notes he was unable to walk out to the mailbox, or up/down stairs like he had previously despite oxygen. He began using more oxygen the previous night. This morning he awoke with intractable cough productive of thick yellow sputum and worsening shortness of breath prompting call to EMS. He had some right sided chest pain with cough that is resolved. In the ED he was prominently wheezing and hypoxemic briefly requiring BiPAP, quickly transitioned back to nasal cannula after breathing treatments. CXR showed bibasilar airspace opacities and he was given levaquin in the ED. BNP 112.4. Cr improved from recent discharge at 1.14 (from 1.28). Initial troponin negative and ECG with lateral repolarization abnormalities present on prior ECGs without overt ischemic changes. TRH was called to admit for acute respiratory failure due to COPD exacerbation.   Review of Systems: He denies fevers, chills, leg swelling, orthopnea. , and per HPI. All others reviewed and are negative.   Past Medical History:  Diagnosis Date  . ALLERGIC RHINITIS   . ANEMIA-NOS   . AORTIC SCLEROSIS   . Asthma   . CARDIOMYOPATHY, ISCHEMIC   . CAROTID BRUIT, RIGHT 02/27/2008  . Cataract    surgery  . CML (chronic myeloid leukemia) ([place]) 06/26/2015  . COPD   .  CORONARY ARTERY DISEASE    CABG 1995, PTCA/DES 2008, 2009 and 08/2010  . DIABETES MELLITUS-TYPE II    diet controlled  . Diastolic dysfunction, Grade 1 11/24/2014  . Diverticulitis of colon with perforation 11/22/2014  . Diverticulosis   . GERD   . HIATAL HERNIA   . Hx of echocardiogram    Echo (9/15):  Mild LVH, EF 50-55%, no RWMA, Gr 1 DD, MAC, mild LAE.  [name] HYPERLIPIDEMIA   . HYPERTENSION   . Hyponatremia 11/22/2014  . IBS (irritable bowel syndrome)   . LACTOSE INTOLERANCE   . OA (osteoarthritis)   . OBESITY   . Partial small bowel obstruction   . PERIPHERAL VASCULAR DISEASE   . Primary hyperparathyroidism ([place])    Lab Results Component Value Date  PTH 150.7* 02/13/2013  CALCIUM 11.0* 02/13/2013  CAION 1.21 03/15/2008    . Prostate cancer ([place])    seed implants 2004  . SICK SINUS/ TACHY-BRADY SYNDROME 09/2007   s/p PPM st judes  . Sleep apnea   . SMALL BOWEL OBSTRUCTION 04/18/2009   Qualifier: History of  By: Asa Lente MD, Jannifer Rodney Symptomatic diverticulosis 01/18/2009   Qualifier: Diagnosis of  By: Shane Crutch, Amy S     Past Surgical History:  Procedure Laterality Date  . BACK SURGERY    . Bilateral cataracts    . COLON RESECTION N/A 11/28/2014   Procedure: EXPLORATORY LAPAROTOMY, SIGMOID COLECTOMY WITH COLOSTOMY;  Surgeon: Jackolyn Confer, MD;  Location: [facility];  Service: General;  Laterality: N/A;  . COLON SURGERY    . COLONOSCOPY    .  CORONARY ARTERY BYPASS GRAFT    . ESOPHAGOGASTRODUODENOSCOPY  multiple  . FLEXIBLE SIGMOIDOSCOPY N/A 09/22/2013   Procedure: FLEXIBLE SIGMOIDOSCOPY;  Surgeon: Gatha Mayer, MD;  Location: [facility];  Service: Endoscopy;  Laterality: N/A;  . INGUINAL HERNIA REPAIR Bilateral   . LUMBAR [place] SURGERY  12/2008  . PACEMAKER INSERTION     DDD/[employer]         Last interrogation 2/13  on chart     Pacemaker guideline order Dr Tamala Julian on chart  . Partial small bowel obstruction  2009  . PENILE PROSTHESIS PLACEMENT    . PTCA  2008,  2009, 2012   with DES  . TOTAL HIP ARTHROPLASTY  08/21/2011   Procedure: TOTAL HIP ARTHROPLASTY;  Surgeon: Johnn Hai, MD;  Location: [facility];  Service: Orthopedics;  Laterality: Right;    - Smoked for ~20 years, not recently. Veteran, worked around [employer]. No EtOH.  Allergies  Allergen Reactions  . Actos [Pioglitazone Hydrochloride] Other (See Comments)    "felt funny, drowsy, and weak":  [name] Buprenorphine Hcl Nausea And Vomiting  . Celebrex [Celecoxib] Other (See Comments)    "felt funny"  . Demerol Palpitations and Other (See Comments)    Increased BP  . Meperidine Palpitations    Other reaction(s): Other (See Comments) Increased BP  . Morphine And Related Nausea And Vomiting  . Ciprofloxacin Other (See Comments)    arthralgia  . Metformin Nausea And Vomiting  . Zocor [Simvastatin] Other (See Comments)    Makes pt very drowsy    Family History  Problem Relation Age of Onset  . Hypertension Mother   . Cancer Mother   . Heart attack    . Heart attack    . Heart attack Brother   . Colon cancer Neg Hx   . Stroke Neg Hx    - Family history otherwise reviewed and not pertinent.  Prior to Admission medications   Medication Sig Start Date End Date Taking? Authorizing Provider  acetaminophen (TYLENOL) 500 MG tablet Take 1,000 mg by mouth every 6 (six) hours as needed for mild pain or fever.   Yes Historical Provider, MD  albuterol (PROVENTIL HFA) 108 (90 BASE) MCG/ACT inhaler Inhale 2 puffs into the lungs every 4 (four) hours as needed for shortness of breath. Wheezing    Yes Historical Provider, MD  albuterol (PROVENTIL) (2.5 MG/3ML) 0.083% nebulizer solution USE 1 VIAL WITH NEBULIZER EVERY 4 HOURS AS NEEDED FOR WHEEZING 11/25/15  Yes Deneise Lever, MD  albuterol-ipratropium (COMBIVENT) 18-103 MCG/ACT inhaler Inhale 1 puff into the lungs every 6 (six) hours as needed for wheezing or shortness of breath.   Yes Historical Provider, MD  aspirin 81 MG tablet Take 81 mg  by mouth every evening.    Yes Historical Provider, MD  dasatinib (SPRYCEL) 70 MG tablet Take 1 tablet (70 mg total) by mouth daily. 05/21/16  Yes Heath Lark, MD  fluticasone (FLONASE) 50 MCG/ACT nasal spray Place 2 sprays into both nostrils daily. Allergies Patient taking differently: Place 2 sprays into both nostrils 2 (two) times daily as needed for allergies. Allergies 08/01/14  Yes [name], MD  furosemide (LASIX) 20 MG tablet Take 1 tablet (20 mg total) by mouth daily. 05/21/16  Yes Janece Canterbury, MD  HYDROcodone-acetaminophen (NORCO) 7.5-325 MG tablet Take 1 tablet by mouth every 6 (six) hours as needed for moderate pain.   Yes Historical Provider, MD  nitroGLYCERIN (NITROSTAT) 0.4 MG SL tablet Place 0.4 mg  under the tongue every 5 (five) minutes as needed for chest pain. Reported on 12/12/2015   Yes Historical Provider, MD  ONE TOUCH ULTRA TEST test strip USE TO CHECK BLOOD SUGARS ONCE A DAY 02/24/16  Yes [name], MD  Polyethyl Glycol-Propyl Glycol (SYSTANE) 0.4-0.3 % SOLN Apply 2 drops to eye 3 (three) times daily as needed (dry eyes).    Yes Historical Provider, MD  pravastatin (PRAVACHOL) 20 MG tablet Take 3 tablets (60 mg total) by mouth daily. 08/01/14  Yes [name], MD  ranitidine (ZANTAC) 300 MG tablet Take 300 mg by mouth at bedtime.    Yes Historical Provider, MD  triamcinolone cream (KENALOG) 0.1 % Apply 1 application topically 3 (three) times daily as needed (dry skin.).  07/19/15  Yes Historical Provider, MD  valsartan (DIOVAN) 160 MG tablet Take 1 tablet (160 mg total) by mouth daily. Patient taking differently: Take 160 mg by mouth 2 (two) times daily.  08/01/14  Yes [name], MD    Physical Exam: Vitals:   06/02/16 1001 06/02/16 1030 06/02/16 1105 06/02/16 1130  BP: 188/66 176/70 161/69 161/61  Pulse: 90 91 86 84  Resp: 22 24 17 26   Temp:      TempSrc:      SpO2: 98% 98% 100% 98%   Constitutional: 82 y.o. male in no  distress, calm demeanor Eyes: Lids and conjunctivae normal, PERRL ENMT: Mucous membranes are moist. Posterior pharynx clear of any exudate or lesions. Fair dentition.  Neck: normal, supple, no masses, no thyromegaly Respiratory: Mildly labored breathing 2L by nasal cannula saturating 96-100%, down to low 90's with cough. No accessory muscle use. Diffuse wheezing and rhonchi Cardiovascular: Regular rate and rhythm, no murmurs, rubs, or gallops. No carotid bruits. No JVD. Trace symmetric LE edema. 2+ pedal pulses. Abdomen: Normoactive bowel sounds. No tenderness, non-distended, and no masses palpated. No hepatosplenomegaly. Colostomy on left without erythema or drainage.  GU: No indwelling catheter Musculoskeletal: No clubbing / cyanosis. No joint deformity upper and lower extremities. Good ROM, no contractures. Normal muscle tone.  Skin: Warm, dry. No rashes, wounds, no ulcers. No significant lesions noted.  Neurologic: Very hard of hearing with hearing aid on left. Gait not assessed. Speech normal. No focal deficits in motor strength or sensation in all extremities.  Psychiatric: Alert and oriented x3. Normal judgment and insight. Mood and affect appropriate.   Labs on Admission: I have personally reviewed following labs and imaging studies  CBC:  Recent Labs Lab 06/02/16 0900  WBC 7.3  HGB 8.5*  HCT 26.2*  MCV 100.0  PLT 0000000   Basic Metabolic Panel:  Recent Labs Lab 06/02/16 0900  NA 136  K 3.9  CL 104  CO2 26  GLUCOSE 138*  BUN 13  CREATININE 1.14  CALCIUM 9.3   Urine analysis:    Component Value Date/Time   COLORURINE YELLOW 12/07/2014 2006   APPEARANCEUR CLEAR 12/07/2014 2006   LABSPEC 1.009 12/07/2014 2006   PHURINE 7.5 12/07/2014 2006   GLUCOSEU NEGATIVE 12/07/2014 2006   GLUCOSEU NEGATIVE 02/09/2014 [place] 12/07/2014 2006   [place] NEGATIVE 12/07/2014 2006   [place] 12/07/2014 2006   PROTEINUR NEGATIVE 12/07/2014 2006    UROBILINOGEN 0.2 12/07/2014 2006   NITRITE NEGATIVE 12/07/2014 2006   LEUKOCYTESUR NEGATIVE 12/07/2014 2006   Radiological Exams on Admission: Dg Chest 2 View  Result Date: 06/02/2016 CLINICAL DATA:  Worsening cough, congestion shortness of breath over the past few days. EXAM:  CHEST  2 VIEW COMPARISON:  PA and lateral chest 05/19/2016, 12/01/2015 and 05/27/2015. FINDINGS: The lungs are emphysematous. Bibasilar airspace disease is identified. No pneumothorax. Small left pleural effusion noted. Heart size is upper normal. The patient is status post CABG with a pacing device in place. IMPRESSION: Bibasilar airspace disease could be due to atelectasis or pneumonia. Emphysema. Electronically Signed   By: Inge Rise M.D.   On: 06/02/2016 08:36   EKG: Independently reviewed. NSR with normal axis, minimal ST elevation in lateral leads consistent with prior ECGs. Late RWP.  Assessment/Plan Active Problems:   COPD with acute exacerbation (HCC)   Acute hypoxemic respiratory failure due to COPD exacerbation from viral and possibly bacterial pneumonia: +RSV and parainfluenza on previous admission, suspect this is prolonged recovery for pt with poor pulmonary reserve due to OSA, OHS, COPD. CXR with bibasilar opacities. No SIRS. [facility] Dr. Annamaria Boots. - Continue levaquin (previously had azithromycin)  - Will give IV steroids today, consider prolonged taper - Duonebs q6h, albuterol neb q3h prn.  - Blood and sputum cultures - CPAP  Chronic diastolic heart failure: Appears euvolemic with BNP 112.4. Recent echo with mild LVH, preserved ejection fraction, grade 1 diastolic dysfunction - Continue lasix - Monitor BMP  T2DM: HbA1c 5.8%, anticipate hyperglycemia with steroids - CBG/SSI qAC/HS  Hypertension: Chronic, stable. Elevated on admission though took no medications today.  - Restart home medications  CAD:s/p CABG with sick sinus s/p pacemaker. He had chest pain,  though since resolved. Initial troponin negative.  - Will complete cycle troponins.   CML: Followed by Dr. Alvy Bimler - Hold dasatinib as advised during previous admission - Dr. Alvy Bimler added to treatment team.  Anemia: Chronic, normocytic due to malignancy. hgb 8.5 on admission, near baseline.  - Monitor signs of bleeding and CBC - Consider transfusion if hgb remains < 9 given CAD and respiratory failure  History of diverticulitis: s/p colostomy anticipated as permanent.  DVT prophylaxis: Lovenox  Code Status: Full  Family Communication: None at bedside Disposition Plan: Admit for acute respiratory failure, IV steroids, abx, ACS rule out Consults called: None  Admission status: Observation    Vance Gather, MD [facility] Pager [phone]  If 7PM-7AM, please contact night-coverage [email or web address] Password [email or web address] 06/02/2016, 12:10 PM

## 2016-06-02 NOTE — Progress Notes (Signed)
Spoke with patient about setting up a cpap for tonight. He does not want to wear one. He wants to wear nasal cannula. I explained the importance of it and told him to let the RN know if he changed his mind and we would set it up for him.

## 2016-06-02 NOTE — Progress Notes (Signed)
Pt. Refused cpap. RT informed pt. To notify if he changes his mind. 

## 2016-06-02 NOTE — ED Provider Notes (Signed)
[facility] DEPT Provider Note   CSN: [id number] Arrival date & time: 06/02/16  0813     History   Chief Complaint Chief Complaint  Patient presents with  . Shortness of Breath    HPI Eric [name] is a 82 y.o. male.  HPI Patient reports he became very short of breath last night. He states he had to get up and use extra oxygen. He reports also he is having cough with very productive of thick yellow mucus. No fevers or chills. He reports that due to shortness of breath and his need to stay on his oxygen, he is essentially been unable to leave his house. He states symptoms have been worsening for several weeks. He reports he was seen in the emergency department for similar symptoms but he states that he was not treated with any antibiotics. He also reports that last night he experienced chest pain along the right side of his chest. He reports it was a "strong pain". It is gone now. Past Medical History:  Diagnosis Date  . ALLERGIC RHINITIS   . ANEMIA-NOS   . AORTIC SCLEROSIS   . Asthma   . CARDIOMYOPATHY, ISCHEMIC   . CAROTID BRUIT, RIGHT 02/27/2008  . Cataract    surgery  . CML (chronic myeloid leukemia) ([place]) 06/26/2015  . COPD   . CORONARY ARTERY DISEASE    CABG 1995, PTCA/DES 2008, 2009 and 08/2010  . DIABETES MELLITUS-TYPE II    diet controlled  . Diastolic dysfunction, Grade 1 11/24/2014  . Diverticulitis of colon with perforation 11/22/2014  . Diverticulosis   . GERD   . HIATAL HERNIA   . Hx of echocardiogram    Echo (9/15):  Mild LVH, EF 50-55%, no RWMA, Gr 1 DD, MAC, mild LAE.  [name] HYPERLIPIDEMIA   . HYPERTENSION   . Hyponatremia 11/22/2014  . IBS (irritable bowel syndrome)   . LACTOSE INTOLERANCE   . OA (osteoarthritis)   . OBESITY   . Partial small bowel obstruction   . PERIPHERAL VASCULAR DISEASE   . Primary hyperparathyroidism ([place])    Lab Results Component Value Date  PTH 150.7* 02/13/2013  CALCIUM 11.0* 02/13/2013  CAION 1.21 03/15/2008    . Prostate  cancer ([place])    seed implants 2004  . SICK SINUS/ TACHY-BRADY SYNDROME 09/2007   s/p PPM st [employer]  . Sleep apnea   . SMALL BOWEL OBSTRUCTION 04/18/2009   Qualifier: History of  By: Asa Lente MD, Jannifer Rodney Symptomatic diverticulosis 01/18/2009   Qualifier: Diagnosis of  By: Shane Crutch, Amy S     Patient Active Problem List   Diagnosis Date Noted  . Acute respiratory failure with hypoxia ([place]) 05/22/2016  . COPD exacerbation ([place]) 05/19/2016  . Diarrhea due to drug 01/28/2016  . Anemia in neoplastic disease 12/12/2015  . Acquired pancytopenia ([place]) 10/28/2015  . Tachy-brady syndrome ([place]) 07/10/2015  . Bilateral carotid artery stenosis 07/10/2015  . Essential hypertension 07/10/2015  . CML (chronic myeloid leukemia) ([place]) 06/26/2015  . Myeloproliferative neoplasm ([place]) 06/25/2015  . Dizziness 05/27/2015  . Back pain 12/10/2014  . Obesity (BMI 30-39.9) 12/08/2014  . Hartmann's pouch of intestine ([place]) 12/08/2014  . HCAP (healthcare-associated pneumonia) 12/07/2014  . CAD (coronary artery disease) of artery bypass graft 11/28/2014  . Chronic diastolic heart failure ([place]) 11/24/2014  . Hyponatremia 11/22/2014  . GERD (gastroesophageal reflux disease) 03/27/2014  . Pacemaker 04/10/2013  . Obstructive sleep apnea 04/11/2011  . Type 2 diabetes mellitus, controlled ([place]) 01/18/2009  .  Dyslipidemia 01/18/2009  . Hypertensive heart disease with CHF ([place]) 01/18/2009  . Coronary atherosclerosis 01/18/2009  . GERD 01/18/2009  . COPD mixed type ([place]) 12/27/2006    Past Surgical History:  Procedure Laterality Date  . BACK SURGERY    . Bilateral cataracts    . COLON RESECTION N/A 11/28/2014   Procedure: EXPLORATORY LAPAROTOMY, SIGMOID COLECTOMY WITH COLOSTOMY;  Surgeon: Jackolyn Confer, MD;  Location: [facility] ORS;  Service: General;  Laterality: N/A;  . COLON SURGERY    . COLONOSCOPY    . CORONARY ARTERY BYPASS GRAFT    . ESOPHAGOGASTRODUODENOSCOPY  multiple  . FLEXIBLE  SIGMOIDOSCOPY N/A 09/22/2013   Procedure: FLEXIBLE SIGMOIDOSCOPY;  Surgeon: Gatha Mayer, MD;  Location: [facility] ENDOSCOPY;  Service: Endoscopy;  Laterality: N/A;  . INGUINAL HERNIA REPAIR Bilateral   . LUMBAR [place] SURGERY  12/2008  . PACEMAKER INSERTION     DDD/[employer]         Last interrogation 2/13  on chart     Pacemaker guideline order Dr Tamala Julian on chart  . Partial small bowel obstruction  2009  . PENILE PROSTHESIS PLACEMENT    . PTCA  2008, 2009, 2012   with DES  . TOTAL HIP ARTHROPLASTY  08/21/2011   Procedure: TOTAL HIP ARTHROPLASTY;  Surgeon: Johnn Hai, MD;  Location: [facility]S;  Service: Orthopedics;  Laterality: Right;       Home Medications    Prior to Admission medications   Medication Sig Start Date End Date Taking? Authorizing Provider  acetaminophen (TYLENOL) 500 MG tablet Take 1,000 mg by mouth every 6 (six) hours as needed for mild pain or fever.   Yes Historical Provider, MD  albuterol (PROVENTIL) (2.5 MG/3ML) 0.083% nebulizer solution USE 1 VIAL WITH NEBULIZER EVERY 4 HOURS AS NEEDED FOR WHEEZING 11/25/15  Yes Deneise Lever, MD  albuterol-ipratropium (COMBIVENT) 18-103 MCG/ACT inhaler Inhale 1 puff into the lungs every 6 (six) hours as needed for wheezing or shortness of breath.   Yes Historical Provider, MD  aspirin 81 MG tablet Take 81 mg by mouth every evening.    Yes Historical Provider, MD  dasatinib (SPRYCEL) 70 MG tablet Take 1 tablet (70 mg total) by mouth daily. 05/21/16  Yes Heath Lark, MD  fluticasone (FLONASE) 50 MCG/ACT nasal spray Place 2 sprays into both nostrils daily. Allergies Patient taking differently: Place 2 sprays into both nostrils 2 (two) times daily as needed for allergies. Allergies 08/01/14  Yes [name], MD  HYDROcodone-acetaminophen (NORCO) 7.5-325 MG tablet Take 1 tablet by mouth every 6 (six) hours as needed for moderate pain.   Yes Historical Provider, MD  nitroGLYCERIN (NITROSTAT) 0.4 MG SL tablet Place 0.4 mg under  the tongue every 5 (five) minutes as needed for chest pain. Reported on 12/12/2015   Yes Historical Provider, MD  ONE TOUCH ULTRA TEST test strip USE TO CHECK BLOOD SUGARS ONCE A DAY 02/24/16  Yes [name], MD  Polyethyl Glycol-Propyl Glycol (SYSTANE) 0.4-0.3 % SOLN Apply 2 drops to eye 3 (three) times daily as needed (dry eyes).    Yes Historical Provider, MD  pravastatin (PRAVACHOL) 20 MG tablet Take 3 tablets (60 mg total) by mouth daily. 08/01/14  Yes [name], MD  ranitidine (ZANTAC) 300 MG tablet Take 300 mg by mouth at bedtime.    Yes Historical Provider, MD  triamcinolone cream (KENALOG) 0.1 % Apply 1 application topically 3 (three) times daily as needed (dry skin.).  07/19/15  Yes Historical  Provider, MD  valsartan (DIOVAN) 160 MG tablet Take 1 tablet (160 mg total) by mouth daily. Patient taking differently: Take 160 mg by mouth 2 (two) times daily.  08/01/14  Yes [name], MD  albuterol (PROVENTIL HFA) 108 (90 Base) MCG/ACT inhaler Inhale 2 puffs into the lungs every 4 (four) hours as needed for shortness of breath. Wheezing 06/09/16   [name], MD  feeding supplement, ENSURE ENLIVE, (ENSURE ENLIVE) LIQD Take 237 mLs by mouth 2 (two) times daily between meals. 06/06/16   Annita Brod, MD  furosemide (LASIX) 40 MG tablet Take 1 tablet (40 mg total) by mouth daily. 06/11/16   [name], MD  guaiFENesin-dextromethorphan (ROBITUSSIN DM) 100-10 MG/5ML syrup Take 5 mLs by mouth every 4 (four) hours as needed for cough. 06/06/16   Annita Brod, MD  predniSONE (DELTASONE) 10 MG tablet 60 mg po daily x 2 days, then 50 mg po daily x 2 days, then decrease by 10mg  every 2 days. 06/06/16   Annita Brod, MD    Family History Family History  Problem Relation Age of Onset  . Hypertension Mother   . Cancer Mother   . Heart attack    . Heart attack    . Heart attack Brother   . Colon cancer Neg Hx   . Stroke Neg Hx     Social  History Social History  Substance Use Topics  . Smoking status: Former Smoker    Packs/day: 1.00    Years: 25.00    Types: Cigarettes    Quit date: 06/08/1994  . Smokeless tobacco: Never Used  . Alcohol use No     Allergies   Actos [pioglitazone hydrochloride]; Buprenorphine hcl; Celebrex [celecoxib]; Demerol; Meperidine; Morphine and related; Ciprofloxacin; Metformin; and Zocor [simvastatin]   Review of Systems Review of Systems 10 Systems reviewed and are negative for acute change except as noted in the HPI.   Physical Exam Updated Vital Signs BP (!) 160/74 (BP Location: Right Arm)   Pulse 82   Temp 97.5 F (36.4 C) (Oral)   Resp 17   Ht 5\' 6"  (1.676 m)   Wt 220 lb 0.3 oz (99.8 kg)   SpO2 96%   BMI 35.51 kg/m   Physical Exam  Constitutional: He is oriented to person, place, and time.  Patient is deconditioned with central obesity. Mild increased work of breathing at rest. Patient speaks in short full sentences.  HENT:  Head: Normocephalic and atraumatic.  Mouth/Throat: Oropharynx is clear and moist.  Eyes: EOM are normal.  Cardiovascular: Normal rate.   Heart sounds are distant. Occasional ectopic beat.  Pulmonary/Chest:  Mild increased work of breathing. Frequent wet cough. Coarse expiratory wheezes throughout. Occasional rhonchi.  Abdominal: Soft. He exhibits no distension. There is no tenderness. There is no guarding.  Musculoskeletal: He exhibits no edema or tenderness.  Excellent condition of skin of the lower extremity is. No cellulitis. No wounds on the feet. No peripheral edema.  Neurological: He is alert and oriented to person, place, and time. He exhibits normal muscle tone. Coordination normal.  Skin: Skin is warm and dry.  Psychiatric: He has a normal mood and affect.     ED Treatments / Results  Labs (all labs ordered are listed, but only abnormal results are displayed) Labs Reviewed  BASIC METABOLIC PANEL - Abnormal; Notable for the following:        Result Value   Glucose, Bld 138 (*)    GFR calc  non Af Amer 56 (*)    All other components within normal limits  CBC - Abnormal; Notable for the following:    RBC 2.62 (*)    Hemoglobin 8.5 (*)    HCT 26.2 (*)    All other components within normal limits  BRAIN NATRIURETIC PEPTIDE - Abnormal; Notable for the following:    B Natriuretic Peptide 112.4 (*)    All other components within normal limits  CBC - Abnormal; Notable for the following:    RBC 2.85 (*)    Hemoglobin 9.3 (*)    HCT 28.4 (*)    All other components within normal limits  CREATININE, SERUM - Abnormal; Notable for the following:    GFR calc non Af Amer 60 (*)    All other components within normal limits  GLUCOSE, CAPILLARY - Abnormal; Notable for the following:    Glucose-Capillary 198 (*)    All other components within normal limits  BASIC METABOLIC PANEL - Abnormal; Notable for the following:    Glucose, Bld 178 (*)    BUN 23 (*)    All other components within normal limits  CBC - Abnormal; Notable for the following:    WBC 3.5 (*)    RBC 2.63 (*)    Hemoglobin 8.4 (*)    HCT 25.7 (*)    All other components within normal limits  GLUCOSE, CAPILLARY - Abnormal; Notable for the following:    Glucose-Capillary 144 (*)    All other components within normal limits  GLUCOSE, CAPILLARY - Abnormal; Notable for the following:    Glucose-Capillary 185 (*)    All other components within normal limits  GLUCOSE, CAPILLARY - Abnormal; Notable for the following:    Glucose-Capillary 154 (*)    All other components within normal limits  GLUCOSE, CAPILLARY - Abnormal; Notable for the following:    Glucose-Capillary 143 (*)    All other components within normal limits  GLUCOSE, CAPILLARY - Abnormal; Notable for the following:    Glucose-Capillary 156 (*)    All other components within normal limits  GLUCOSE, CAPILLARY - Abnormal; Notable for the following:    Glucose-Capillary 169 (*)    All other components  within normal limits  GLUCOSE, CAPILLARY - Abnormal; Notable for the following:    Glucose-Capillary 226 (*)    All other components within normal limits  GLUCOSE, CAPILLARY - Abnormal; Notable for the following:    Glucose-Capillary 128 (*)    All other components within normal limits  GLUCOSE, CAPILLARY - Abnormal; Notable for the following:    Glucose-Capillary 233 (*)    All other components within normal limits  GLUCOSE, CAPILLARY - Abnormal; Notable for the following:    Glucose-Capillary 164 (*)    All other components within normal limits  COMPREHENSIVE METABOLIC PANEL - Abnormal; Notable for the following:    Glucose, Bld 227 (*)    BUN 41 (*)    Total Protein 6.4 (*)    Albumin 3.4 (*)    GFR calc non Af Amer 55 (*)    All other components within normal limits  CBC - Abnormal; Notable for the following:    RBC 2.69 (*)    Hemoglobin 8.6 (*)    HCT 26.3 (*)    All other components within normal limits  GLUCOSE, CAPILLARY - Abnormal; Notable for the following:    Glucose-Capillary 179 (*)    All other components within normal limits  GLUCOSE, CAPILLARY - Abnormal; Notable for the following:  Glucose-Capillary 165 (*)    All other components within normal limits  GLUCOSE, CAPILLARY - Abnormal; Notable for the following:    Glucose-Capillary 168 (*)    All other components within normal limits  GLUCOSE, CAPILLARY - Abnormal; Notable for the following:    Glucose-Capillary 134 (*)    All other components within normal limits  GLUCOSE, CAPILLARY - Abnormal; Notable for the following:    Glucose-Capillary 206 (*)    All other components within normal limits  CULTURE, BLOOD (ROUTINE X 2)  CULTURE, BLOOD (ROUTINE X 2)  I-STAT TROPOININ, ED    EKG  EKG Interpretation  Date/Time:  Tuesday June 02 2016 08:28:46 EST Ventricular Rate:  95 PR Interval:    QRS Duration: 96 QT Interval:  368 QTC Calculation: 463 R Axis:   7 Text Interpretation:  Sinus rhythm Low  voltage, precordial leads Abnormal R-wave progression, late transition Minimal ST elevation, lateral leads agree, no change from previous Confirmed by Johnney Killian, MD, Jeannie Done [phone]) on 06/02/2016 8:33:09 AM       Radiology No results found.  Procedures Procedures (including critical care time)  Medications Ordered in ED Medications  albuterol (PROVENTIL) (2.5 MG/3ML) 0.083% nebulizer solution 5 mg (5 mg Nebulization Given 06/02/16 0827)  levofloxacin (LEVAQUIN) IVPB 750 mg (0 mg Intravenous Stopped 06/02/16 1154)  iopamidol (ISOVUE-370) 76 % injection (73 mLs Intravenous Contrast Given 06/03/16 1150)  levofloxacin (LEVAQUIN) tablet 500 mg (500 mg Oral Given 06/06/16 1024)     Initial Impression / Assessment and Plan / ED Course  I have reviewed the triage vital signs and the nursing notes.  Pertinent labs & imaging results that were available during my care of the patient were reviewed by me and considered in my medical decision making (see chart for details).  Clinical Course     Final Clinical Impressions(s) / ED Diagnoses   Final diagnoses:  SOB (shortness of breath)  COPD with acute exacerbation (HCC)  Hypertensive heart disease with CHF ([place])  COPD mixed type (HCC)   Increasing symptoms of pneumonia versus COPD. Patient reports increasing oxygen dependency and decreased ability to function at baseline. Patient describes productive cough. He also experienced episode of chest pain today. Plan will be for admission for ongoing treatment. New Prescriptions Discharge Medication List as of 06/06/2016  1:22 PM    START taking these medications   Details  feeding supplement, ENSURE ENLIVE, (ENSURE ENLIVE) LIQD Take 237 mLs by mouth 2 (two) times daily between meals., Starting Sat 06/06/2016, Normal    guaiFENesin-dextromethorphan (ROBITUSSIN DM) 100-10 MG/5ML syrup Take 5 mLs by mouth every 4 (four) hours as needed for cough., Starting Sat 06/06/2016, Normal    predniSONE  (DELTASONE) 10 MG tablet 60 mg po daily x 2 days, then 50 mg po daily x 2 days, then decrease by 10mg  every 2 days., Normal         Charlesetta Shanks, MD 06/12/16 [phone]

## 2016-06-02 NOTE — Progress Notes (Addendum)
Pharmacy Antibiotic Note  Eric [name] is a 82 y.o. male admitted on 06/02/2016 with AECOPD and possible superimposed CAP.  Recently treated with Azithromycin for AECOPD (received Rocephin x 1 during that admission). Pharmacy has been consulted for levofloxacin dosing. Note patient reports "arthralgia" with Cipro which could be manifestations of tendonitis or peripheral neuropathy  Plan:  Levaquin 500 mg IV q24 hr; given patient's age, borderline renal function, and history of ADEs with Cipro, will use lower COPD dosing  F/u SCr, clinical course, LOT  Monitor patient closely for signs of tendonitis or peripheral neuropathy on Levaquin     Temp (24hrs), Avg:97.6 F (36.4 C), Min:97.6 F (36.4 C), Max:97.6 F (36.4 C)   Recent Labs Lab 06/02/16 0900  WBC 7.3  CREATININE 1.14    Estimated Creatinine Clearance: 50.1 mL/min (by C-G formula based on SCr of 1.14 mg/dL).    Allergies  Allergen Reactions  . Actos [Pioglitazone Hydrochloride] Other (See Comments)    "felt funny, drowsy, and weak":  [name] Buprenorphine Hcl Nausea And Vomiting  . Celebrex [Celecoxib] Other (See Comments)    "felt funny"  . Demerol Palpitations and Other (See Comments)    Increased BP  . Meperidine Palpitations    Other reaction(s): Other (See Comments) Increased BP  . Morphine And Related Nausea And Vomiting  . Ciprofloxacin Other (See Comments)    arthralgia  . Metformin Nausea And Vomiting  . Zocor [Simvastatin] Other (See Comments)    Makes pt very drowsy     Thank you for allowing pharmacy to be a part of this patient's care.  Reuel Boom, PharmD, BCPS Pager: [phone] 06/02/2016, 1:24 PM

## 2016-06-03 ENCOUNTER — Encounter (HOSPITAL_COMMUNITY): Payer: Self-pay | Admitting: Radiology

## 2016-06-03 ENCOUNTER — Observation Stay (HOSPITAL_COMMUNITY): Payer: Medicare Other

## 2016-06-03 DIAGNOSIS — E662 Morbid (severe) obesity with alveolar hypoventilation: Secondary | ICD-10-CM | POA: Diagnosis present

## 2016-06-03 DIAGNOSIS — R0602 Shortness of breath: Secondary | ICD-10-CM | POA: Diagnosis not present

## 2016-06-03 DIAGNOSIS — Z9981 Dependence on supplemental oxygen: Secondary | ICD-10-CM | POA: Diagnosis not present

## 2016-06-03 DIAGNOSIS — J189 Pneumonia, unspecified organism: Secondary | ICD-10-CM | POA: Diagnosis not present

## 2016-06-03 DIAGNOSIS — I5032 Chronic diastolic (congestive) heart failure: Secondary | ICD-10-CM | POA: Diagnosis not present

## 2016-06-03 DIAGNOSIS — K219 Gastro-esophageal reflux disease without esophagitis: Secondary | ICD-10-CM | POA: Diagnosis present

## 2016-06-03 DIAGNOSIS — J441 Chronic obstructive pulmonary disease with (acute) exacerbation: Secondary | ICD-10-CM | POA: Diagnosis not present

## 2016-06-03 DIAGNOSIS — J9621 Acute and chronic respiratory failure with hypoxia: Secondary | ICD-10-CM | POA: Diagnosis not present

## 2016-06-03 DIAGNOSIS — C9210 Chronic myeloid leukemia, BCR/ABL-positive, not having achieved remission: Secondary | ICD-10-CM | POA: Diagnosis present

## 2016-06-03 DIAGNOSIS — E1165 Type 2 diabetes mellitus with hyperglycemia: Secondary | ICD-10-CM | POA: Diagnosis present

## 2016-06-03 DIAGNOSIS — I110 Hypertensive heart disease with heart failure: Secondary | ICD-10-CM | POA: Diagnosis present

## 2016-06-03 DIAGNOSIS — Z888 Allergy status to other drugs, medicaments and biological substances status: Secondary | ICD-10-CM | POA: Diagnosis not present

## 2016-06-03 DIAGNOSIS — Z6835 Body mass index (BMI) 35.0-35.9, adult: Secondary | ICD-10-CM | POA: Diagnosis not present

## 2016-06-03 DIAGNOSIS — Y95 Nosocomial condition: Secondary | ICD-10-CM | POA: Diagnosis present

## 2016-06-03 DIAGNOSIS — Z881 Allergy status to other antibiotic agents status: Secondary | ICD-10-CM | POA: Diagnosis not present

## 2016-06-03 DIAGNOSIS — Z933 Colostomy status: Secondary | ICD-10-CM | POA: Diagnosis not present

## 2016-06-03 DIAGNOSIS — I25810 Atherosclerosis of coronary artery bypass graft(s) without angina pectoris: Secondary | ICD-10-CM | POA: Diagnosis present

## 2016-06-03 DIAGNOSIS — D630 Anemia in neoplastic disease: Secondary | ICD-10-CM | POA: Diagnosis present

## 2016-06-03 DIAGNOSIS — Z8249 Family history of ischemic heart disease and other diseases of the circulatory system: Secondary | ICD-10-CM | POA: Diagnosis not present

## 2016-06-03 DIAGNOSIS — Z951 Presence of aortocoronary bypass graft: Secondary | ICD-10-CM | POA: Diagnosis not present

## 2016-06-03 DIAGNOSIS — J9601 Acute respiratory failure with hypoxia: Secondary | ICD-10-CM

## 2016-06-03 DIAGNOSIS — J440 Chronic obstructive pulmonary disease with acute lower respiratory infection: Secondary | ICD-10-CM | POA: Diagnosis present

## 2016-06-03 DIAGNOSIS — E1151 Type 2 diabetes mellitus with diabetic peripheral angiopathy without gangrene: Secondary | ICD-10-CM | POA: Diagnosis present

## 2016-06-03 DIAGNOSIS — Z87891 Personal history of nicotine dependence: Secondary | ICD-10-CM | POA: Diagnosis not present

## 2016-06-03 DIAGNOSIS — Z8546 Personal history of malignant neoplasm of prostate: Secondary | ICD-10-CM | POA: Diagnosis not present

## 2016-06-03 DIAGNOSIS — Z885 Allergy status to narcotic agent status: Secondary | ICD-10-CM | POA: Diagnosis not present

## 2016-06-03 DIAGNOSIS — Z950 Presence of cardiac pacemaker: Secondary | ICD-10-CM | POA: Diagnosis not present

## 2016-06-03 LAB — GLUCOSE, CAPILLARY
GLUCOSE-CAPILLARY: 185 mg/dL — AB (ref 65–99)
GLUCOSE-CAPILLARY: 154 mg/dL — AB (ref 65–99)
GLUCOSE-CAPILLARY: 143 mg/dL — AB (ref 65–99)
Glucose-Capillary: 156 mg/dL — ABNORMAL HIGH (ref 65–99)

## 2016-06-03 LAB — CBC
HCT: 25.7 % — ABNORMAL LOW (ref 39.0–52.0)
Hemoglobin: 8.4 g/dL — ABNORMAL LOW (ref 13.0–17.0)
MCH: 31.9 pg (ref 26.0–34.0)
MCHC: 32.7 g/dL (ref 30.0–36.0)
MCV: 97.7 fL (ref 78.0–100.0)
Platelets: 164 K/uL (ref 150–400)
RBC: 2.63 MIL/uL — ABNORMAL LOW (ref 4.22–5.81)
RDW: 14.8 % (ref 11.5–15.5)
WBC: 3.5 K/uL — ABNORMAL LOW (ref 4.0–10.5)

## 2016-06-03 LAB — BASIC METABOLIC PANEL
Anion gap: 11 (ref 5–15)
BUN: 23 mg/dL — AB (ref 6–20)
CALCIUM: 9.2 mg/dL (ref 8.9–10.3)
CO2: 22 mmol/L (ref 22–32)
CREATININE: 1.06 mg/dL (ref 0.61–1.24)
Chloride: 102 mmol/L (ref 101–111)
GFR calc Af Amer: >60 mL/min (ref >60)
GLUCOSE: 178 mg/dL — AB (ref 65–99)
Potassium: 4.5 mmol/L (ref 3.5–5.1)
Sodium: 135 mmol/L (ref 135–145)

## 2016-06-03 MED ORDER — SODIUM CHLORIDE 0.9 % IV SOLN
INTRAVENOUS | Status: DC
  Administered 2016-06-03: 13:00:00 via INTRAVENOUS

## 2016-06-03 MED ORDER — IOPAMIDOL (ISOVUE-370) INJECTION 76%
INTRAVENOUS | Status: AC
  Administered 2016-06-03: 73 mL via INTRAVENOUS
  Filled 2016-06-03: qty 100

## 2016-06-03 MED ORDER — HYDROCODONE-HOMATROPINE 5-1.5 MG/5ML PO SYRP
5.0000 mL | ORAL_SOLUTION | Freq: Four times a day (QID) | ORAL | Status: DC | PRN
  Administered 2016-06-04 (×2): 5 mL via ORAL
  Filled 2016-06-03 (×2): qty 5

## 2016-06-03 MED ORDER — ENSURE ENLIVE PO LIQD
237.0000 mL | Freq: Two times a day (BID) | ORAL | Status: DC
  Administered 2016-06-04 – 2016-06-06 (×5): 237 mL via ORAL

## 2016-06-03 NOTE — Evaluation (Addendum)
Occupational Therapy Evaluation Patient Details Name: Eric [name] MRN: [id number] DOB: 1926/11/05 Today's Date: 06/03/2016    History of Present Illness 82 y.o. male with a history of CAD s/p CABG, chronic diastolic CHF, COPD on 2L at home, OSA, CML, HTN, diverticulitis s/p colostomy, who presented 12/26 and was admitted for acute hypoxemic respiratory failure due to COPD exacerbation from viral and possibly bacterial pneumonia   Clinical Impression   Pt was admitted for the above.  He was mod I at baseline and will benefit from continued OT in acute setting.  Goals are for supervision to mod I level.    Follow Up Recommendations  No OT follow up (pt declines HH services)    Equipment Recommendations  None recommended by OT    Recommendations for Other Services       Precautions / Restrictions Precautions Precautions: Fall Precaution Comments: monitor O2 sats Restrictions Weight Bearing Restrictions: No      Mobility Bed Mobility               General bed mobility comments: oob  Transfers Overall transfer level: Needs assistance Equipment used: Rolling walker (2 wheeled) Transfers: Sit to/from Stand Sit to Stand: Min guard         General transfer comment: min/guard for safety, denies SOB    Balance                                            ADL Overall ADL's : Needs assistance/impaired                                       General ADL Comments: pt is able to perform ADLs with set up and min guard for sit to stand.  took a couple of steps to sink, but he did not want to brush his teeth until after dinner.  Educated on energy conservation.  Pt had just finished walking with PT and used the bathroom     Vision     Perception     Praxis      Pertinent Vitals/Pain Pain Assessment: No/denies pain     Hand Dominance     Extremity/Trunk Assessment     Lower Extremity Assessment Lower Extremity  Assessment: Overall WFL for tasks assessed   Cervical / Trunk Assessment Cervical / Trunk Assessment: Kyphotic   Communication Communication Communication: HOH   Cognition Arousal/Alertness: Awake/alert Behavior During Therapy: WFL for tasks assessed/performed Overall Cognitive Status: Within Functional Limits for tasks assessed                     General Comments       Exercises       Shoulder Instructions      Home Living Family/patient expects to be discharged to:: Private residence Living Arrangements: Alone   Type of Home: Independent living facility Home Access: Level entry     Home Layout: One level     Bathroom Shower/Tub: Occupational psychologist: Handicapped height     Home Equipment: Shower seat - built in   Additional Comments: pt drives and does everything himself      Prior Functioning/Environment Level of Independence: Independent with assistive device(s)        Comments: uses rollator vs cane  OT Problem List: Decreased strength;Decreased activity tolerance;Impaired balance (sitting and/or standing);Decreased knowledge of use of DME or AE   OT Treatment/Interventions: Self-care/ADL training;DME and/or AE instruction;Energy conservation;Patient/family education;Balance training    OT Goals(Current goals can be found in the care plan section) Acute Rehab OT Goals Patient Stated Goal: home OT Goal Formulation: With patient Time For Goal Achievement: 06/10/16 Potential to Achieve Goals: Good ADL Goals Pt Will Transfer to Toilet: with modified independence;ambulating (high commode) Pt Will Perform Tub/Shower Transfer: Shower transfer;ambulating;with supervision;shower seat Additional ADL Goal #1: pt will gather clothes and complete adl at mod I level Additional ADL Goal #2: pt will initiate at least one rest break during adls for energy conservation  OT Frequency: Min 2X/week   Barriers to D/C:             Co-evaluation              End of Session    Activity Tolerance: Patient tolerated treatment well Patient left: in chair;with call bell/phone within reach;with chair alarm set   Time: [id number] OT Time Calculation (min): 16 min Charges:  OT General Charges $OT Visit: 1 Procedure OT Evaluation $OT Eval Low Complexity: 1 Procedure G-Codes: OT G-codes **NOT FOR INPATIENT CLASS** Functional Assessment Tool Used: clinical observation and judgment Functional Limitation: Self care Self Care Current Status [id number]): At least 1 percent but less than 20 percent impaired, limited or restricted Self Care Goal Status [id number]): At least 1 percent but less than 20 percent impaired, limited or restricted  Ryett Hamman 06/03/2016, 4:06 PM  Lesle Chris, OTR/L [phone] 06/03/2016

## 2016-06-03 NOTE — Progress Notes (Signed)
OT Cancellation Note  Patient Details Name: ZUBIN BIERLEIN MRN: [id number] DOB: [birth year]   Cancelled Treatment:    Reason Eval/Treat Not Completed: Other (comment).  CT angio pending. Will check back.  Ronnetta Currington 06/03/2016, 10:52 AM  Lesle Chris, OTR/L [phone] 06/03/2016

## 2016-06-03 NOTE — Evaluation (Signed)
Physical Therapy Evaluation Patient Details Name: Eric [name] MRN: [id number] DOB: 04-Jul-1926 Today's Date: 06/03/2016   History of Present Illness  82 y.o. male with a history of CAD s/p CABG, chronic diastolic CHF, COPD on 2L at home, OSA, CML, HTN, diverticulitis s/p colostomy, who presented 12/26 and was admitted for acute hypoxemic respiratory failure due to COPD exacerbation from viral and possibly bacterial pneumonia  Clinical Impression  Pt admitted with above diagnosis. Pt currently with functional limitations due to the deficits listed below (see PT Problem List).  Pt will benefit from skilled PT to increase their independence and safety with mobility to allow discharge to the venue listed below.   Pt ambulated into bathroom and reports no SOB (was on 3L O2 at that time).  Pt wondering if he needs oxygen so monitored SpO2 and SpO2 remained 93% or above during session on room air.  Pt reports feeling a little weaker since admission due to not moving as much.     Follow Up Recommendations No PT follow up (declines HHPT)    Equipment Recommendations  None recommended by PT    Recommendations for Other Services       Precautions / Restrictions Precautions Precautions: Fall Precaution Comments: monitor O2 sats      Mobility  Bed Mobility               General bed mobility comments: pt up in recliner on arrival  Transfers Overall transfer level: Needs assistance Equipment used: Rolling walker (2 wheeled) Transfers: Sit to/from Stand Sit to Stand: Min guard         General transfer comment: min/guard for safety, denies SOB  Ambulation/Gait Ambulation/Gait assistance: Min guard Ambulation Distance (Feet): 200 Feet Assistive device: Rolling walker (2 wheeled) Gait Pattern/deviations: Step-through pattern;Decreased stride length     General Gait Details: pt steady with RW, pt reports fatigue end of ambulation, pt states he isn't SOB and does not know why  he needs oxygen (required supplemental O2 upon d/c home last admission) so ambulated without O2 and SpO2 remained 93% room air and above, 100% on room air upon leaving room (RN notified and aware pt left off O2)  Stairs            Wheelchair Mobility    Modified Rankin (Stroke Patients Only)       Balance                                             Pertinent Vitals/Pain Pain Assessment: No/denies pain    Home Living Family/patient expects to be discharged to:: Private residence Living Arrangements: Alone   Type of Home: Independent living facility Home Access: Level entry     Home Layout: One level Home Equipment: Environmental consultant - 4 wheels      Prior Function Level of Independence: Independent with assistive device(s)         Comments: uses rollator vs cane     Hand Dominance        Extremity/Trunk Assessment        Lower Extremity Assessment Lower Extremity Assessment: Overall WFL for tasks assessed    Cervical / Trunk Assessment Cervical / Trunk Assessment: Kyphotic  Communication   Communication: HOH  Cognition Arousal/Alertness: Awake/alert Behavior During Therapy: WFL for tasks assessed/performed Overall Cognitive Status: Within Functional Limits for tasks assessed  General Comments      Exercises     Assessment/Plan    PT Assessment Patient needs continued PT services  PT Problem List Decreased strength;Decreased activity tolerance;Decreased mobility;Decreased knowledge of use of DME;Cardiopulmonary status limiting activity          PT Treatment Interventions DME instruction;Gait training;Functional mobility training;Therapeutic activities;Therapeutic exercise;Patient/family education    PT Goals (Current goals can be found in the Care Plan section)  Acute Rehab PT Goals PT Goal Formulation: With patient Time For Goal Achievement: 06/10/16 Potential to Achieve Goals: Good    Frequency  Min 3X/week   Barriers to discharge        Co-evaluation               End of Session   Activity Tolerance: Patient tolerated treatment well Patient left: in chair;with call bell/phone within reach;with chair alarm set      Functional Assessment Tool Used: clinical judgement Functional Limitation: Mobility: Walking and moving around Mobility: Walking and Moving Around Current Status [id number]): At least 20 percent but less than 40 percent impaired, limited or restricted Mobility: Walking and Moving Around Goal Status [phone]): At least 1 percent but less than 20 percent impaired, limited or restricted    Time: 1433-1447 PT Time Calculation (min) (ACUTE ONLY): 14 min   Charges:   PT Evaluation $PT Eval Low Complexity: 1 Procedure     PT G Codes:   PT G-Codes **NOT FOR INPATIENT CLASS** Functional Assessment Tool Used: clinical judgement Functional Limitation: Mobility: Walking and moving around Mobility: Walking and Moving Around Current Status [id number]): At least 20 percent but less than 40 percent impaired, limited or restricted Mobility: Walking and Moving Around Goal Status [phone]): At least 1 percent but less than 20 percent impaired, limited or restricted    Maximos Zayas,KATHrine E 06/03/2016, 3:31 PM Carmelia Bake, PT, DPT 06/03/2016 Pager: [phone]

## 2016-06-03 NOTE — Consult Note (Signed)
   [facility] Inpatient Consult   06/03/2016  Eric [name] 1926-12-13 [id number]   [facility] Care Management referral follow up. Went to bedside to speak with [name]. However he was on the way down for at CT angiogram per nursing. Will follow up at later time to discuss potential [place] Management services with [name]. Made inpatient RNCM of attempt to see patient.   [name], MSN-Ed, RN,BSN [facility] Liaison [phone]

## 2016-06-03 NOTE — Consult Note (Signed)
   [facility] Inpatient Consult   06/03/2016  [name] [birth year] [id number]     [facility] Care Management follow up. Spoke with [name] at bedside to explain and offer [place] Management services. He reports he is familiar with the [place] Management program as he was followed in the past.  [name] is agreeable to [place] follow up due to recent readmission and history of COPD, CHF, DM. Explained that he will receive post hospital discharge calls and will be evaluated for home visits. Explained in detail that [place] Management will not interfere or replace services provided home health. Made him aware [name] program services are no cost to him.  Written consent obtained and [facility] Care Management packet along with contact information provided.   [name] states he still has some short of breath and is "worried sick thinking about the results of the CT scan" he just had. He endorses  he lives alone in the [place]. Denies having issues with obtaining medications. However, he states, " I used to fill out my pill box and I stopped, I need to start back filling it". Even though he states his daughter sometimes takes him to his MD appointments, transportation to MD appointments could be an issue. Confirms Primary Care MD is Dr. Sharlet Salina. Confirms best contact number is [phone].  [name] reports he has had multiple medical issues over the years. States " If my cancer has spread, I do not want things to be prolonged. My daughter doesn't want to talk about these kind of things, but we need to talk about it". [name] appears to have good insight on his medical issues, medications, and the like. He could benefit from goals of care discussion at some point.  In the meantime, made inpatient RNCM aware of the above. Will refer for [place] once disposition is confirmed ,due to recent readmission, multiple co-morbidities, and lives alone. Will  continue to follow and refer to [facility] team as appropriate.   [name], MSN-Ed, RN,BSN [facility] Liaison [phone]

## 2016-06-03 NOTE — Progress Notes (Signed)
Pt refused CPAP qhs.  RT will continue to monitor as needed. 

## 2016-06-03 NOTE — Consult Note (Signed)
[facility] Nurse ostomy consult note Stoma type/location: LLQ colostomy with large parastomal hernia. Last seen approximately 2 weeks ago during last admission. Stomal assessment/size: 1 and 3/4 inches round, flat. Os in center Peristomal assessment: Intact, clear.  Large parastomal hernia. Treatment options for stomal/peristomal skin: None indicated Output: soft, brown stool Ostomy pouching: 2pc.2 and 3/4 inch pouching system. An extra pouching set up (with a pattern) is provided for patient in the cupboard above his sink.  Education provided: None indicated. Patient states that he likes the pouch I provided last time with no gas filter. Enrolled patient in [place] program: No. Patient is established with [facility]. [place] nursing team will not follow, but will remain available to this patient, the nursing and medical teams.  Please re-consult if needed. Thanks, Maudie Flakes, MSN, RN, [place], Arther Abbott  Pager# [phone]

## 2016-06-03 NOTE — Progress Notes (Signed)
PT Cancellation Note  Patient Details Name: Eric [name] MRN: [id number] DOB: 02-16-1927   Cancelled Treatment:    Reason Eval/Treat Not Completed: Other (comment) (awaiting CTA results)   Macenzie Burford,KATHrine E 06/03/2016, 10:34 AM Carmelia Bake, PT, DPT 06/03/2016 Pager: [phone]

## 2016-06-03 NOTE — Progress Notes (Signed)
Initial Nutrition Assessment  DOCUMENTATION CODES:   Obesity unspecified  INTERVENTION:  Provide Ensure Enlive po BID, each supplement provides 350 kcal and 20 grams of protein.  Reviewed handout on Taste/Smell Alterations from the Academy of [employer] Dietetics with patient. Encouraged small, frequent meals and other strategies to improve intake with taste changes.   NUTRITION DIAGNOSIS:   Increased nutrient needs related to catabolic illness, cancer and cancer related treatments (CML and COPD Exacerbation) as evidenced by estimated needs.  GOAL:   Patient will meet greater than or equal to 90% of their needs  MONITOR:   PO intake, Supplement acceptance, Labs, Weight trends, I & O's  REASON FOR ASSESSMENT:   Consult Assessment of nutrition requirement/status  ASSESSMENT:   82 y.o.malewith a history of CAD s/p CABG, chronic diastolic CHF, COPD on 2L at home, OSA, CML, HTN, diverticulitis s/p colostomy, who presented 12/26 for worsening shortness of breath and productive cough.He was recently discharged after an admission for COPD exacerbation triggered by RSV and parainfluenza viral infections. Found to have ARF due to COPD exacerbation.   -Patient was diagnosed with CML 06/20/2015 and started chemotherapy 06/26/2015.  Spoke with patient at bedside. Patient reports his appetite is "alright" but not as good as it used to be. He reports taste changes. Denies N/V, abdominal pain, constipation/diarrhea, or difficulty chewing/swallowing. Patient reports eating 3 meals daily at home. He typically has a meat (steak/chicken) with a roll, salad, and vegetables.   Patient reports UBW of 240 lbs and that he has lost weight over the past 3-4 weeks. Patient's weight has fluctuated this past year between 215 lbs and 235 lbs. Most recently, patient has lost 10 lbs (4% body weight) over 2 months, which is not significant for time frame. Some of the weight fluctuations may be attributed to  fluid changes with CHF and patient is currently on Lasix.   Meal Completion: 50-100% per chart.  Medications reviewed and include: Lasix 20 mg daily, Novolog sliding scale TID with meals and daily at bedtime, methylprednisolone 60 mg Q12hrs.  Labs reviewed: CBG 144-198 past 24 hrs, BUN 23.   Nutrition-Focused physical exam completed. Findings are no fat depletion, mild muscle depletion (noted in dorsal hand only), and no edema.   Diet Order:  Diet Carb Modified Fluid consistency: Thin; Room service appropriate? Yes  Skin:  Reviewed, no issues  Last BM:  Unknown  Height:   Ht Readings from Last 1 Encounters:  06/02/16 5\' 6"  (1.676 m)    Weight:   Wt Readings from Last 1 Encounters:  06/03/16 218 lb 14.7 oz (99.3 kg)    Ideal Body Weight:  64.5 kg  BMI:  Body mass index is 35.33 kg/m.  Estimated Nutritional Needs:   Kcal:  2100-2400 (MSJ x 1.3-1.5)  Protein:  120-140 grams (1.2-1.4 grams/kg)  Fluid:  >/= 1.6 L/day  EDUCATION NEEDS:   Education needs addressed  Willey Blade, MS, RD, LDN Pager: [phone] After Hours Pager: [phone]

## 2016-06-03 NOTE — Progress Notes (Signed)
[facility] PROGRESS NOTE  Eric [name] [id number] DOB: 05/11/1927 DOA: 06/02/2016   PCP: [name], MD     Assessment/Plan: Principal Problem:   COPD with acute exacerbation [facility]) Active Problems:   Type 2 diabetes mellitus, controlled ([place])   Obstructive sleep apnea   Chronic diastolic heart failure ([place])   CAD (coronary artery disease) of artery bypass graft   HCAP (healthcare-associated pneumonia)   Obesity (BMI 30-39.9)   Anemia in chronic illness   Acute respiratory failure with hypoxia [facility])   Eric [name] is a 82 y.o. male with a history of CAD s/p CABG, chronic diastolic CHF, COPD on 2L at home, OSA, CML, HTN, diverticulitis s/p colostomy, who presented 12/26 for worsening shortness of breath and productive cough.He was recently discharged after an admission for COPD exacerbation triggered by RSV and parainfluenza viral infections. He completed prednisone, continued lasix, and was sent with home oxygen but notes he was unable to walk out to the mailbox, or up/down stairs like he had previously despite oxygen.[facility] was called to admit for acute respiratory failure due to COPD exacerbation  Assessment and plan  Acute hypoxemic respiratory failure due to COPD exacerbation from viral and possibly bacterial pneumonia: Confirmed by CT scan Recent +RSV and parainfluenza on previous admission, suspect this is prolonged recovery for pt with poor pulmonary reserve due to OSA, OHS, COPD. CXR with bibasilar opacities. No SIRS. [facility] Dr. Annamaria Boots. - Continue levaquin (previously had azithromycin)  Continue IV steroids today, consider prolonged taper - Duonebs q6h, albuterol neb q3h prn.  - Blood and sputum cultures - CPAP No PE on CTA  Chronic diastolic heart failure: Appears euvolemic with BNP 112.4. Recent echo with mild LVH, preserved ejection fraction, grade 1 diastolic dysfunction. Most recent echo 12/12 -  Continue lasix - Monitor BMP  T2DM: HbA1c 5.8%, anticipate hyperglycemia with steroids - CBG/SSI qAC/HS  Hypertension: Chronic, stable. Elevated on admission though took no medications today.  - Restart home medications  CAD:s/p CABG with sick sinus s/p pacemaker. He had chest pain, though since resolved. Initial troponin negative.  - Will complete cycle troponins.   CML: Followed by Dr. Alvy Bimler - Hold dasatinib as advised during previous admission - Dr. Alvy Bimler added to treatment team.  Anemia: Chronic, normocytic due to malignancy. hgb 8.5 on admission, near baseline.  - Monitor signs of bleeding and CBC - Consider transfusion if hgb remains < 8 given CAD and respiratory failure  History of diverticulitis: s/p colostomy anticipated as permanent.    DVT prophylaxsis Lovenox  Code Status:  Full code    Family Communication: Discussed in detail with the patient, all imaging results, lab results explained to the patient   Disposition Plan: Due to 3 days      Consultants:  None  Procedures:  None  Antibiotics: Anti-infectives    Start     Dose/Rate Route Frequency Ordered Stop   06/03/16 1000  levofloxacin (LEVAQUIN) IVPB 500 mg     500 mg 100 mL/hr over 60 Minutes Intravenous Every 24 hours 06/02/16 1315     06/02/16 0930  levofloxacin (LEVAQUIN) IVPB 750 mg     750 mg 100 mL/hr over 90 Minutes Intravenous  Once 06/02/16 0916 06/02/16 1154         HPI/Subjective: Very congested, postnasal drip, continues to cough  Objective: Vitals:   06/02/16 2204 06/03/16 0007 06/03/16 0500 06/03/16 0911  BP: (!) 171/69  (!) 156/52  Pulse: 81  74   Resp: (!) 24  (!) 22   Temp: 97.2 F (36.2 C)  97.5 F (36.4 C)   TempSrc: Oral  Oral   SpO2: 96% 98% 97% 96%  Weight:   99.3 kg (218 lb 14.7 oz)   Height:        Intake/Output Summary (Last 24 hours) at 06/03/16 1255 Last data filed at 06/03/16 1023  Gross per 24 hour  Intake              720 ml   Output              500 ml  Net              220 ml    Exam:  Examination:  General exam: Appears calm and comfortable  Respiratory system: Clear to auscultation. Respiratory effort normal. Cardiovascular system: S1 & S2 heard, RRR. No JVD, murmurs, rubs, gallops or clicks. No pedal edema. Gastrointestinal system: Abdomen is nondistended, soft and nontender. No organomegaly or masses felt. Normal bowel sounds heard. Central nervous system: Alert and oriented. No focal neurological deficits. Extremities: Symmetric 5 x 5 power. Skin: No rashes, lesions or ulcers Psychiatry: Judgement and insight appear normal. Mood & affect appropriate.     Data Reviewed: I have personally reviewed following labs and imaging studies  Micro Results No results found for this or any previous visit (from the past 240 hour(s)).  Radiology Reports Dg Chest 2 View  Result Date: 06/02/2016 CLINICAL DATA:  Worsening cough, congestion shortness of breath over the past few days. EXAM: CHEST  2 VIEW COMPARISON:  PA and lateral chest 05/19/2016, 12/01/2015 and 05/27/2015. FINDINGS: The lungs are emphysematous. Bibasilar airspace disease is identified. No pneumothorax. Small left pleural effusion noted. Heart size is upper normal. The patient is status post CABG with a pacing device in place. IMPRESSION: Bibasilar airspace disease could be due to atelectasis or pneumonia. Emphysema. Electronically Signed   By: Inge Rise M.D.   On: 06/02/2016 08:36   Dg Chest 2 View  Result Date: 05/19/2016 CLINICAL DATA:  Several days of chest congestion and cough with some chest pain and shortness of breath. History of COPD, ischemic cardiomyopathy, diabetes, former smoker. EXAM: CHEST  2 VIEW COMPARISON:  PA and lateral chest x-ray of December 01, 2015 FINDINGS: The lungs are well-expanded. The interstitial markings are coarse. There is no alveolar infiltrate or pleural effusion. The cardiac silhouette is enlarged. The  central pulmonary vascularity is mildly prominent but there is no cephalization. There is calcification in the wall of the thoracic aorta. There are post CABG changes. The ICD is in stable position. There is multilevel degenerative disc disease of the thoracic spine. IMPRESSION: COPD. No focal pneumonia. Cardiomegaly with mild central pulmonary vascular congestion. No definite pulmonary edema. Thoracic aortic atherosclerosis. Electronically Signed   By: David  Martinique M.D.   On: 05/19/2016 09:49   Ct Angio Chest Pe W Or Wo Contrast  Result Date: 06/03/2016 CLINICAL DATA:  Cough and shortness of breath for weeks. EXAM: CT ANGIOGRAPHY CHEST WITH CONTRAST TECHNIQUE: Multidetector CT imaging of the chest was performed using the standard protocol during bolus administration of intravenous contrast. Multiplanar CT image reconstructions and MIPs were obtained to evaluate the vascular anatomy. CONTRAST:  73 mL of Isovue 370 intravenous contrast COMPARISON:  Chest radiograph, 06/02/2016 FINDINGS: Cardiovascular: There is satisfactory opacification of the pulmonary arteries. However, the study is limited by respiratory motion which limits evaluation of the segmental  and subsegmental pulmonary arteries, particularly in the lower lobes. Allowing for this limitation, there is no evidence of a pulmonary embolus. The great vessels are normal in caliber. Atherosclerotic calcifications are noted along the thoracic aorta. No dissection. There are dense coronary artery calcifications. The heart is mildly enlarged. There changes from previous CABG surgery. Mediastinum/Nodes: No neck base, axillary, mediastinal or hilar masses or adenopathy. Lungs/Pleura: There is bilateral lower lobe bronchial wall thickening with peribronchovascular airspace opacity, most confluent in the posterior lung bases. Milder opacity lies at the bases of the right middle lobe and left upper lobe lingula, consistent with atelectasis. Mild paraseptal and  centrilobular emphysema is noted in the upper lobes. There is no convincing pulmonary edema. No significant pleural effusions. Upper Abdomen: Low-density liver lesions consistent with cysts. Mild bilateral renal cortical thinning. Partial fatty replacement of the pancreas. No acute findings. Musculoskeletal: No fracture or acute finding. No osteoblastic or osteolytic lesions. Degenerative changes noted throughout the visualized spine. Bones are diffusely demineralized. Review of the MIP images confirms the above findings. IMPRESSION: 1. Study mildly limited by respiratory motion. Allowing for this, there is no evidence of a pulmonary embolus. 2. Lower lobe bronchial wall thickening with associated patchy peribronchovascular opacity and more confluent opacity at the posterior lung bases. Suspect a combination of bronchitis and/or bronchopneumonia and atelectasis. No evidence of pulmonary edema. 3. Mild cardiomegaly, changes from previous CABG surgery and dense coronary artery calcifications. Electronically Signed   By: Lajean Manes M.D.   On: 06/03/2016 12:20     CBC  Recent Labs Lab 06/02/16 0900 06/02/16 1321 06/03/16 0453  WBC 7.3 6.7 3.5*  HGB 8.5* 9.3* 8.4*  HCT 26.2* 28.4* 25.7*  PLT 178 170 164  MCV 100.0 99.6 97.7  MCH 32.4 32.6 31.9  MCHC 32.4 32.7 32.7  RDW 15.0 14.9 14.8    Chemistries   Recent Labs Lab 06/02/16 0900 06/02/16 1321 06/03/16 0453  NA 136  --  135  K 3.9  --  4.5  CL 104  --  102  CO2 26  --  22  GLUCOSE 138*  --  178*  BUN 13  --  23*  CREATININE 1.14 1.08 1.06  CALCIUM 9.3  --  9.2   ------------------------------------------------------------------------------------------------------------------ estimated creatinine clearance is 54.2 mL/min (by C-G formula based on SCr of 1.06 mg/dL). ------------------------------------------------------------------------------------------------------------------ No results for input(s): HGBA1C in the last 72  hours. ------------------------------------------------------------------------------------------------------------------ No results for input(s): CHOL, HDL, LDLCALC, TRIG, CHOLHDL, LDLDIRECT in the last 72 hours. ------------------------------------------------------------------------------------------------------------------ No results for input(s): TSH, T4TOTAL, T3FREE, THYROIDAB in the last 72 hours.  Invalid input(s): FREET3 ------------------------------------------------------------------------------------------------------------------ No results for input(s): VITAMINB12, FOLATE, FERRITIN, TIBC, IRON, RETICCTPCT in the last 72 hours.  Coagulation profile No results for input(s): INR, PROTIME in the last 168 hours.  No results for input(s): DDIMER in the last 72 hours.  Cardiac Enzymes No results for input(s): CKMB, TROPONINI, MYOGLOBIN in the last 168 hours.  Invalid input(s): CK ------------------------------------------------------------------------------------------------------------------ Invalid input(s): POCBNP   CBG:  Recent Labs Lab 06/02/16 1639 06/02/16 2208 06/03/16 0804 06/03/16 1228  GLUCAP 198* 144* 185* 154*       Studies: Dg Chest 2 View  Result Date: 06/02/2016 CLINICAL DATA:  Worsening cough, congestion shortness of breath over the past few days. EXAM: CHEST  2 VIEW COMPARISON:  PA and lateral chest 05/19/2016, 12/01/2015 and 05/27/2015. FINDINGS: The lungs are emphysematous. Bibasilar airspace disease is identified. No pneumothorax. Small left pleural effusion noted. Heart size is upper normal. The  patient is status post CABG with a pacing device in place. IMPRESSION: Bibasilar airspace disease could be due to atelectasis or pneumonia. Emphysema. Electronically Signed   By: Inge Rise M.D.   On: 06/02/2016 08:36   Ct Angio Chest Pe W Or Wo Contrast  Result Date: 06/03/2016 CLINICAL DATA:  Cough and shortness of breath for weeks. EXAM: CT  ANGIOGRAPHY CHEST WITH CONTRAST TECHNIQUE: Multidetector CT imaging of the chest was performed using the standard protocol during bolus administration of intravenous contrast. Multiplanar CT image reconstructions and MIPs were obtained to evaluate the vascular anatomy. CONTRAST:  73 mL of Isovue 370 intravenous contrast COMPARISON:  Chest radiograph, 06/02/2016 FINDINGS: Cardiovascular: There is satisfactory opacification of the pulmonary arteries. However, the study is limited by respiratory motion which limits evaluation of the segmental and subsegmental pulmonary arteries, particularly in the lower lobes. Allowing for this limitation, there is no evidence of a pulmonary embolus. The great vessels are normal in caliber. Atherosclerotic calcifications are noted along the thoracic aorta. No dissection. There are dense coronary artery calcifications. The heart is mildly enlarged. There changes from previous CABG surgery. Mediastinum/Nodes: No neck base, axillary, mediastinal or hilar masses or adenopathy. Lungs/Pleura: There is bilateral lower lobe bronchial wall thickening with peribronchovascular airspace opacity, most confluent in the posterior lung bases. Milder opacity lies at the bases of the right middle lobe and left upper lobe lingula, consistent with atelectasis. Mild paraseptal and centrilobular emphysema is noted in the upper lobes. There is no convincing pulmonary edema. No significant pleural effusions. Upper Abdomen: Low-density liver lesions consistent with cysts. Mild bilateral renal cortical thinning. Partial fatty replacement of the pancreas. No acute findings. Musculoskeletal: No fracture or acute finding. No osteoblastic or osteolytic lesions. Degenerative changes noted throughout the visualized spine. Bones are diffusely demineralized. Review of the MIP images confirms the above findings. IMPRESSION: 1. Study mildly limited by respiratory motion. Allowing for this, there is no evidence of a  pulmonary embolus. 2. Lower lobe bronchial wall thickening with associated patchy peribronchovascular opacity and more confluent opacity at the posterior lung bases. Suspect a combination of bronchitis and/or bronchopneumonia and atelectasis. No evidence of pulmonary edema. 3. Mild cardiomegaly, changes from previous CABG surgery and dense coronary artery calcifications. Electronically Signed   By: Lajean Manes M.D.   On: 06/03/2016 12:20      Lab Results  Component Value Date   HGBA1C 5.8 (H) 05/20/2016   HGBA1C 6.9 (H) 05/27/2015   HGBA1C 6.5 (H) 11/22/2014   Lab Results  Component Value Date   MICROALBUR 0.8 09/30/2012   LDLCALC 55 05/27/2015   CREATININE 1.06 06/03/2016       Scheduled Meds: . aspirin EC  81 mg Oral QPM  . enoxaparin (LOVENOX) injection  40 mg Subcutaneous Q24H  . fluticasone  2 spray Each Nare Daily  . furosemide  20 mg Oral Daily  . insulin aspart  0-5 Units Subcutaneous QHS  . insulin aspart  0-9 Units Subcutaneous TID WC  . ipratropium-albuterol  3 mL Nebulization Q6H  . irbesartan  150 mg Oral Daily  . levofloxacin (LEVAQUIN) IV  500 mg Intravenous Q24H  . methylPREDNISolone (SOLU-MEDROL) injection  60 mg Intravenous Q12H  . pravastatin  60 mg Oral Daily  . sodium chloride flush  3 mL Intravenous Q12H   Continuous Infusions: . sodium chloride 75 mL/hr at 06/03/16 1255     LOS: 0 days    Time spent: >30 MINS    [facility]  [facility] Pager  [phone]. If 7PM-7AM, please contact night-coverage at [email or web address], password [email or web address] 06/03/2016, 12:55 PM  LOS: 0 days

## 2016-06-04 LAB — GLUCOSE, CAPILLARY
GLUCOSE-CAPILLARY: 226 mg/dL — AB (ref 65–99)
GLUCOSE-CAPILLARY: 128 mg/dL — AB (ref 65–99)
Glucose-Capillary: 169 mg/dL — ABNORMAL HIGH (ref 65–99)
Glucose-Capillary: 233 mg/dL — ABNORMAL HIGH (ref 65–99)

## 2016-06-04 MED ORDER — LEVOFLOXACIN 500 MG PO TABS
500.0000 mg | ORAL_TABLET | Freq: Every day | ORAL | Status: AC
Start: 2016-06-05 — End: 2016-06-06
  Administered 2016-06-05 – 2016-06-06 (×2): 500 mg via ORAL
  Filled 2016-06-04 (×2): qty 1

## 2016-06-04 MED ORDER — PANTOPRAZOLE SODIUM 40 MG PO TBEC
40.0000 mg | DELAYED_RELEASE_TABLET | Freq: Every day | ORAL | Status: DC
  Administered 2016-06-04 – 2016-06-06 (×3): 40 mg via ORAL
  Filled 2016-06-04 (×3): qty 1

## 2016-06-04 MED ORDER — METHYLPREDNISOLONE SODIUM SUCC 125 MG IJ SOLR
60.0000 mg | Freq: Three times a day (TID) | INTRAMUSCULAR | Status: DC
  Administered 2016-06-04 – 2016-06-05 (×3): 60 mg via INTRAVENOUS
  Filled 2016-06-04 (×3): qty 2

## 2016-06-04 MED ORDER — BUDESONIDE 0.5 MG/2ML IN SUSP
0.5000 mg | Freq: Two times a day (BID) | RESPIRATORY_TRACT | Status: DC
  Administered 2016-06-04 – 2016-06-06 (×4): 0.5 mg via RESPIRATORY_TRACT
  Filled 2016-06-04 (×4): qty 2

## 2016-06-04 NOTE — Consult Note (Signed)
   [facility] Inpatient Consult   06/04/2016  ROCKLIN OMURA [birth year] [id number]    [facility] Care Management follow up. Spoke with inpatient RNCM who indicates patient currently declining home health services. Request made for Mr. Hunt to be assigned to [employer].   [name], MSN-Ed, RN,BSN [facility] Liaison [phone]

## 2016-06-04 NOTE — Progress Notes (Signed)
Pt states that he from [place] apartments and will not need HH at present time.

## 2016-06-04 NOTE — Progress Notes (Signed)
CSW received consult for COPD Gold Protocol, though patient does not meet qualifications (only 2 admissions within the past 6 months).   CSW signing off.   Raynaldo Opitz, [facility] Clinical Social Worker cell #: [phone]

## 2016-06-04 NOTE — Progress Notes (Signed)
[facility] PROGRESS NOTE  Eric [name] [id number] DOB: 10-Jun-1926 DOA: 06/02/2016   PCP: [name], MD     Assessment/Plan: Principal Problem:   COPD with acute exacerbation [facility]) Active Problems:   Type 2 diabetes mellitus, controlled ([place])   Obstructive sleep apnea   Chronic diastolic heart failure ([place])   CAD (coronary artery disease) of artery bypass graft   HCAP (healthcare-associated pneumonia)   Obesity (BMI 30-39.9)   Anemia in chronic illness   Acute respiratory failure with hypoxia [facility])   Eric [name] is a 82 y.o. male with a history of CAD s/p CABG, chronic diastolic CHF, COPD on 2L at home, OSA, CML, HTN, diverticulitis s/p colostomy, who presented 12/26 for worsening shortness of breath and productive cough.He was recently discharged after an admission for COPD exacerbation triggered by RSV and parainfluenza viral infections. He completed prednisone, continued lasix, and was sent with home oxygen but notes he was unable to walk out to the mailbox, or up/down stairs like he had previously despite oxygen.TRH was called to admit for acute respiratory failure due to COPD exacerbation  Assessment and plan Acute hypoxemic respiratory failure due to COPD exacerbation from viral and possibly bacterial pneumonia: Confirmed by CT scan Recent +RSV and parainfluenza on previous admission, suspect this is prolonged recovery for pt with poor pulmonary reserve due to OSA, OHS, COPD.  CXR with bibasilar opacities. No SIRS. [facility] Dr. Annamaria Boots. Continue levaquin , started 12/26 (previously had azithromycin)  Continue IV steroids  Increase frequency to q 8 hrs , consider prolonged taper - Duonebs q6h, albuterol neb q3h prn. Start budesonide nebs - Blood and sputum cultures - CPAP No PE on CTA  Chronic diastolic heart failure: Appears euvolemic with BNP 112.4. Recent echo with mild LVH, preserved ejection fraction,  grade 1 diastolic dysfunction. Most recent echo 12/12 - Continue lasix - Monitor BMP  T2DM: HbA1c 5.8%, anticipate hyperglycemia with steroids - CBG/SSI qAC/HS  Hypertension: Chronic, stable. Elevated on admission though took no medications today.  - Restart home medications  CAD:s/p CABG with sick sinus s/p pacemaker. He had chest pain, though since resolved. Initial troponin negative.  - Will complete cycle troponins.   CML: Followed by Dr. Alvy Bimler - Hold dasatinib as advised during previous admission - Dr. Alvy Bimler added to treatment team.  Anemia: Chronic, normocytic due to malignancy. hgb 8.5 on admission, near baseline.  - Monitor signs of bleeding and CBC - Consider transfusion if hgb remains < 8 given CAD and respiratory failure  History of diverticulitis: s/p colostomy anticipated as permanent.    DVT prophylaxsis Lovenox  Code Status:  Full code    Family Communication: Discussed in detail with the patient, all imaging results, lab results explained to the patient   Disposition Plan: Due to 3 days      Consultants:  None  Procedures:  None  Antibiotics: Anti-infectives    Start     Dose/Rate Route Frequency Ordered Stop   06/05/16 1000  levofloxacin (LEVAQUIN) tablet 500 mg     500 mg Oral Daily 06/04/16 1020 06/07/16 0959   06/03/16 1000  levofloxacin (LEVAQUIN) IVPB 500 mg  Status:  Discontinued     500 mg 100 mL/hr over 60 Minutes Intravenous Every 24 hours 06/02/16 1315 06/04/16 1020   06/02/16 0930  levofloxacin (LEVAQUIN) IVPB 750 mg     750 mg 100 mL/hr over 90 Minutes Intravenous  Once 06/02/16 0916 06/02/16  1154         HPI/Subjective: Still has a cough , on 3 L of oxygen now   Objective: Vitals:   06/03/16 2110 06/03/16 2145 06/04/16 0522 06/04/16 0859  BP:  (!) 165/48 (!) 156/68   Pulse:  88 82   Resp:  (!) 22 (!) 24   Temp:  97.6 F (36.4 C) 97.7 F (36.5 C)   TempSrc:  Oral Oral   SpO2: 98% 99% 99% 95%  Weight:    99.5 kg (219 lb 5.7 oz)   Height:        Intake/Output Summary (Last 24 hours) at 06/04/16 1209 Last data filed at 06/04/16 [id number]  Gross per 24 hour  Intake             1000 ml  Output                0 ml  Net             1000 ml    Exam:  Examination:  General exam: Appears calm and comfortable  Respiratory system: Clear to auscultation. Respiratory effort normal. Cardiovascular system: S1 & S2 heard, RRR. No JVD, murmurs, rubs, gallops or clicks. No pedal edema. Gastrointestinal system: Abdomen is nondistended, soft and nontender. No organomegaly or masses felt. Normal bowel sounds heard. Central nervous system: Alert and oriented. No focal neurological deficits. Extremities: Symmetric 5 x 5 power. Skin: No rashes, lesions or ulcers Psychiatry: Judgement and insight appear normal. Mood & affect appropriate.     Data Reviewed: I have personally reviewed following labs and imaging studies  Micro Results Recent Results (from the past 240 hour(s))  Culture, blood (routine x 2)     Status: None (Preliminary result)   Collection Time: 06/02/16  1:10 PM  Result Value Ref Range Status   Specimen Description BLOOD RIGHT ARM  Final   Special Requests BOTTLES DRAWN AEROBIC AND ANAEROBIC [place]  Final   Culture   Final    NO GROWTH < 24 HOURS Performed at [facility]    Report Status PENDING  Incomplete  Culture, blood (routine x 2)     Status: None (Preliminary result)   Collection Time: 06/02/16  1:22 PM  Result Value Ref Range Status   Specimen Description BLOOD LEFT ARM  Final   Special Requests IN PEDIATRIC BOTTLE 4CC  Final   Culture   Final    NO GROWTH < 24 HOURS Performed at [facility]    Report Status PENDING  Incomplete    Radiology Reports Dg Chest 2 View  Result Date: 06/02/2016 CLINICAL DATA:  Worsening cough, congestion shortness of breath over the past few days. EXAM: CHEST  2 VIEW COMPARISON:  PA and lateral chest 05/19/2016, 12/01/2015 and  05/27/2015. FINDINGS: The lungs are emphysematous. Bibasilar airspace disease is identified. No pneumothorax. Small left pleural effusion noted. Heart size is upper normal. The patient is status post CABG with a pacing device in place. IMPRESSION: Bibasilar airspace disease could be due to atelectasis or pneumonia. Emphysema. Electronically Signed   By: Inge Rise M.D.   On: 06/02/2016 08:36   Dg Chest 2 View  Result Date: 05/19/2016 CLINICAL DATA:  Several days of chest congestion and cough with some chest pain and shortness of breath. History of COPD, ischemic cardiomyopathy, diabetes, former smoker. EXAM: CHEST  2 VIEW COMPARISON:  PA and lateral chest x-ray of December 01, 2015 FINDINGS: The lungs are well-expanded. The interstitial markings are coarse.  There is no alveolar infiltrate or pleural effusion. The cardiac silhouette is enlarged. The central pulmonary vascularity is mildly prominent but there is no cephalization. There is calcification in the wall of the thoracic aorta. There are post CABG changes. The ICD is in stable position. There is multilevel degenerative disc disease of the thoracic spine. IMPRESSION: COPD. No focal pneumonia. Cardiomegaly with mild central pulmonary vascular congestion. No definite pulmonary edema. Thoracic aortic atherosclerosis. Electronically Signed   By: David  Martinique M.D.   On: 05/19/2016 09:49   Ct Angio Chest Pe W Or Wo Contrast  Result Date: 06/03/2016 CLINICAL DATA:  Cough and shortness of breath for weeks. EXAM: CT ANGIOGRAPHY CHEST WITH CONTRAST TECHNIQUE: Multidetector CT imaging of the chest was performed using the standard protocol during bolus administration of intravenous contrast. Multiplanar CT image reconstructions and MIPs were obtained to evaluate the vascular anatomy. CONTRAST:  73 mL of Isovue 370 intravenous contrast COMPARISON:  Chest radiograph, 06/02/2016 FINDINGS: Cardiovascular: There is satisfactory opacification of the pulmonary  arteries. However, the study is limited by respiratory motion which limits evaluation of the segmental and subsegmental pulmonary arteries, particularly in the lower lobes. Allowing for this limitation, there is no evidence of a pulmonary embolus. The great vessels are normal in caliber. Atherosclerotic calcifications are noted along the thoracic aorta. No dissection. There are dense coronary artery calcifications. The heart is mildly enlarged. There changes from previous CABG surgery. Mediastinum/Nodes: No neck base, axillary, mediastinal or hilar masses or adenopathy. Lungs/Pleura: There is bilateral lower lobe bronchial wall thickening with peribronchovascular airspace opacity, most confluent in the posterior lung bases. Milder opacity lies at the bases of the right middle lobe and left upper lobe lingula, consistent with atelectasis. Mild paraseptal and centrilobular emphysema is noted in the upper lobes. There is no convincing pulmonary edema. No significant pleural effusions. Upper Abdomen: Low-density liver lesions consistent with cysts. Mild bilateral renal cortical thinning. Partial fatty replacement of the pancreas. No acute findings. Musculoskeletal: No fracture or acute finding. No osteoblastic or osteolytic lesions. Degenerative changes noted throughout the visualized spine. Bones are diffusely demineralized. Review of the MIP images confirms the above findings. IMPRESSION: 1. Study mildly limited by respiratory motion. Allowing for this, there is no evidence of a pulmonary embolus. 2. Lower lobe bronchial wall thickening with associated patchy peribronchovascular opacity and more confluent opacity at the posterior lung bases. Suspect a combination of bronchitis and/or bronchopneumonia and atelectasis. No evidence of pulmonary edema. 3. Mild cardiomegaly, changes from previous CABG surgery and dense coronary artery calcifications. Electronically Signed   By: Lajean Manes M.D.   On: 06/03/2016 12:20      CBC  Recent Labs Lab 06/02/16 0900 06/02/16 1321 06/03/16 0453  WBC 7.3 6.7 3.5*  HGB 8.5* 9.3* 8.4*  HCT 26.2* 28.4* 25.7*  PLT 178 170 164  MCV 100.0 99.6 97.7  MCH 32.4 32.6 31.9  MCHC 32.4 32.7 32.7  RDW 15.0 14.9 14.8    Chemistries   Recent Labs Lab 06/02/16 0900 06/02/16 1321 06/03/16 0453  NA 136  --  135  K 3.9  --  4.5  CL 104  --  102  CO2 26  --  22  GLUCOSE 138*  --  178*  BUN 13  --  23*  CREATININE 1.14 1.08 1.06  CALCIUM 9.3  --  9.2   ------------------------------------------------------------------------------------------------------------------ estimated creatinine clearance is 54.2 mL/min (by C-G formula based on SCr of 1.06 mg/dL). ------------------------------------------------------------------------------------------------------------------ No results for input(s): HGBA1C in the  last 72 hours. ------------------------------------------------------------------------------------------------------------------ No results for input(s): CHOL, HDL, LDLCALC, TRIG, CHOLHDL, LDLDIRECT in the last 72 hours. ------------------------------------------------------------------------------------------------------------------ No results for input(s): TSH, T4TOTAL, T3FREE, THYROIDAB in the last 72 hours.  Invalid input(s): FREET3 ------------------------------------------------------------------------------------------------------------------ No results for input(s): VITAMINB12, FOLATE, FERRITIN, TIBC, IRON, RETICCTPCT in the last 72 hours.  Coagulation profile No results for input(s): INR, PROTIME in the last 168 hours.  No results for input(s): DDIMER in the last 72 hours.  Cardiac Enzymes No results for input(s): CKMB, TROPONINI, MYOGLOBIN in the last 168 hours.  Invalid input(s): CK ------------------------------------------------------------------------------------------------------------------ Invalid input(s): POCBNP   CBG:  Recent  Labs Lab 06/03/16 1228 06/03/16 1731 06/03/16 2142 06/04/16 0809 06/04/16 1201  GLUCAP 154* 143* 156* 169* 226*       Studies: Ct Angio Chest Pe W Or Wo Contrast  Result Date: 06/03/2016 CLINICAL DATA:  Cough and shortness of breath for weeks. EXAM: CT ANGIOGRAPHY CHEST WITH CONTRAST TECHNIQUE: Multidetector CT imaging of the chest was performed using the standard protocol during bolus administration of intravenous contrast. Multiplanar CT image reconstructions and MIPs were obtained to evaluate the vascular anatomy. CONTRAST:  73 mL of Isovue 370 intravenous contrast COMPARISON:  Chest radiograph, 06/02/2016 FINDINGS: Cardiovascular: There is satisfactory opacification of the pulmonary arteries. However, the study is limited by respiratory motion which limits evaluation of the segmental and subsegmental pulmonary arteries, particularly in the lower lobes. Allowing for this limitation, there is no evidence of a pulmonary embolus. The great vessels are normal in caliber. Atherosclerotic calcifications are noted along the thoracic aorta. No dissection. There are dense coronary artery calcifications. The heart is mildly enlarged. There changes from previous CABG surgery. Mediastinum/Nodes: No neck base, axillary, mediastinal or hilar masses or adenopathy. Lungs/Pleura: There is bilateral lower lobe bronchial wall thickening with peribronchovascular airspace opacity, most confluent in the posterior lung bases. Milder opacity lies at the bases of the right middle lobe and left upper lobe lingula, consistent with atelectasis. Mild paraseptal and centrilobular emphysema is noted in the upper lobes. There is no convincing pulmonary edema. No significant pleural effusions. Upper Abdomen: Low-density liver lesions consistent with cysts. Mild bilateral renal cortical thinning. Partial fatty replacement of the pancreas. No acute findings. Musculoskeletal: No fracture or acute finding. No osteoblastic or  osteolytic lesions. Degenerative changes noted throughout the visualized spine. Bones are diffusely demineralized. Review of the MIP images confirms the above findings. IMPRESSION: 1. Study mildly limited by respiratory motion. Allowing for this, there is no evidence of a pulmonary embolus. 2. Lower lobe bronchial wall thickening with associated patchy peribronchovascular opacity and more confluent opacity at the posterior lung bases. Suspect a combination of bronchitis and/or bronchopneumonia and atelectasis. No evidence of pulmonary edema. 3. Mild cardiomegaly, changes from previous CABG surgery and dense coronary artery calcifications. Electronically Signed   By: Lajean Manes M.D.   On: 06/03/2016 12:20      Lab Results  Component Value Date   HGBA1C 5.8 (H) 05/20/2016   HGBA1C 6.9 (H) 05/27/2015   HGBA1C 6.5 (H) 11/22/2014   Lab Results  Component Value Date   MICROALBUR 0.8 09/30/2012   LDLCALC 55 05/27/2015   CREATININE 1.06 06/03/2016       Scheduled Meds: . aspirin EC  81 mg Oral QPM  . enoxaparin (LOVENOX) injection  40 mg Subcutaneous Q24H  . feeding supplement (ENSURE ENLIVE)  237 mL Oral BID BM  . fluticasone  2 spray Each Nare Daily  . furosemide  20 mg Oral Daily  .  insulin aspart  0-5 Units Subcutaneous QHS  . insulin aspart  0-9 Units Subcutaneous TID WC  . ipratropium-albuterol  3 mL Nebulization Q6H  . irbesartan  150 mg Oral Daily  . [START ON 06/05/2016] levofloxacin  500 mg Oral Daily  . methylPREDNISolone (SOLU-MEDROL) injection  60 mg Intravenous Q8H  . pantoprazole  40 mg Oral Daily  . pravastatin  60 mg Oral Daily  . sodium chloride flush  3 mL Intravenous Q12H   Continuous Infusions:    LOS: 1 day    Time spent: >30 MINS    [facility]  [facility] Pager [phone]. If 7PM-7AM, please contact night-coverage at [email or web address], password [place] 06/04/2016, 12:09 PM  LOS: 1 day

## 2016-06-04 NOTE — Progress Notes (Signed)
OT Cancellation Note  Patient Details Name: Eric [name] MRN: [id number] DOB: [birth year]   Cancelled Treatment:    Reason Eval/Treat Not Completed: Other (comment).  Pt felts short of breath when I checked on him this am.  Will check back either later today or another day.  [name] 06/04/2016, 12:22 PM  Lesle Chris, OTR/L [phone] 06/04/2016

## 2016-06-04 NOTE — Progress Notes (Signed)
Pt states that he will notify RN once he is ready for bed.

## 2016-06-05 LAB — GLUCOSE, CAPILLARY
GLUCOSE-CAPILLARY: 179 mg/dL — AB (ref 65–99)
GLUCOSE-CAPILLARY: 165 mg/dL — AB (ref 65–99)
GLUCOSE-CAPILLARY: 168 mg/dL — AB (ref 65–99)
Glucose-Capillary: 164 mg/dL — ABNORMAL HIGH (ref 65–99)

## 2016-06-05 LAB — CBC
HEMATOCRIT: 26.3 % — AB (ref 39.0–52.0)
HEMOGLOBIN: 8.6 g/dL — AB (ref 13.0–17.0)
MCH: 32 pg (ref 26.0–34.0)
MCHC: 32.7 g/dL (ref 30.0–36.0)
MCV: 97.8 fL (ref 78.0–100.0)
Platelets: 185 10*3/uL (ref 150–400)
RBC: 2.69 MIL/uL — ABNORMAL LOW (ref 4.22–5.81)
RDW: 14.9 % (ref 11.5–15.5)
WBC: 6.7 10*3/uL (ref 4.0–10.5)

## 2016-06-05 LAB — COMPREHENSIVE METABOLIC PANEL
ALBUMIN: 3.4 g/dL — AB (ref 3.5–5.0)
ALK PHOS: 47 U/L (ref 38–126)
ALT: 22 U/L (ref 17–63)
ANION GAP: 9 (ref 5–15)
AST: 28 U/L (ref 15–41)
BILIRUBIN TOTAL: 0.3 mg/dL (ref 0.3–1.2)
BUN: 41 mg/dL — AB (ref 6–20)
CALCIUM: 9.2 mg/dL (ref 8.9–10.3)
CO2: 22 mmol/L (ref 22–32)
Chloride: 105 mmol/L (ref 101–111)
Creatinine, Ser: 1.16 mg/dL (ref 0.61–1.24)
GFR calc Af Amer: >60 mL/min (ref >60)
GFR, EST NON AFRICAN AMERICAN: 55 mL/min — AB (ref >60)
GLUCOSE: 227 mg/dL — AB (ref 65–99)
POTASSIUM: 4.5 mmol/L (ref 3.5–5.1)
Sodium: 136 mmol/L (ref 135–145)
TOTAL PROTEIN: 6.4 g/dL — AB (ref 6.5–8.1)

## 2016-06-05 MED ORDER — METHYLPREDNISOLONE SODIUM SUCC 125 MG IJ SOLR
60.0000 mg | Freq: Two times a day (BID) | INTRAMUSCULAR | Status: DC
  Administered 2016-06-05 – 2016-06-06 (×2): 60 mg via INTRAVENOUS
  Filled 2016-06-05 (×3): qty 2

## 2016-06-05 NOTE — Progress Notes (Signed)
Patient placed on hospital auto BIPAP machine with no complications with 2L bleed in. Nurse aware.

## 2016-06-05 NOTE — Progress Notes (Signed)
OT Cancellation Note  Patient Details Name: Eric [name] MRN: [id number] DOB: 08/03/1926   Cancelled Treatment:    Reason Eval/Treat Not Completed: Patient declined. Walked earlier with PT and didn't feel up to moving again.  [name] 06/05/2016, 11:42 AM  Lesle Chris, OTR/L [phone] 06/05/2016

## 2016-06-05 NOTE — Progress Notes (Signed)
Inpatient Diabetes Program Recommendations  AACE/ADA: New Consensus Statement on Inpatient Glycemic Control (2015)  Target Ranges:  Prepandial:   less than 140 mg/dL      Peak postprandial:   less than 180 mg/dL (1-2 hours)      Critically ill patients:  140 - 180 mg/dL   Results for YOSHINORI, HUX (MRN [id number]) as of 06/05/2016 09:14  Ref. Range 06/04/2016 08:09 06/04/2016 12:01 06/04/2016 16:59 06/04/2016 21:39  Glucose-Capillary Latest Ref Range: 65 - 99 mg/dL 169 (H) 226 (H) 128 (H) 233 (H)    Admit with: COPD  History: DM2, COPD, CHF  Home DM Meds: None- Diet controlled  Current Insulin Orders: Novolog Sensitive Correction Scale/ SSI (0-9 units) TID AC + HS       MD- Note patient receiving Solumedrol 60 mg BID.  Having some glucose elevations.  Please consider increasing Novolog Correction Scale/ SSI to Moderate scale (0-15 units) TID AC + HS (currently ordered as Sensitive scale 0-9 units)     --Will follow patient during hospitalization--  Wyn Quaker RN, MSN, CDE Diabetes Coordinator Inpatient Glycemic Control Team Team Pager: [phone] (8a-5p)

## 2016-06-05 NOTE — Progress Notes (Signed)
Physical Therapy Treatment Patient Details Name: Eric [name] MRN: [id number] DOB: 23-Jun-1926 Today's Date: 06-28-16    History of Present Illness 82 y.o. male with a history of CAD s/p CABG, chronic diastolic CHF, COPD on 2L at home, OSA, CML, HTN, diverticulitis s/p colostomy, who presented 12/26 and was admitted for acute hypoxemic respiratory failure due to COPD exacerbation from viral and possibly bacterial pneumonia    PT Comments    Pt very eager to ambulate and discharge home.  Pt tolerated ambulation well, remained on 3L O2 [place].  Follow Up Recommendations  No PT follow up     Equipment Recommendations  None recommended by PT    Recommendations for Other Services       Precautions / Restrictions Precautions Precautions: Fall Precaution Comments: monitor O2 sats    Mobility  Bed Mobility               General bed mobility comments: pt up in recliner on arrival  Transfers Overall transfer level: Needs assistance Equipment used: Rolling walker (2 wheeled) Transfers: Sit to/from Stand Sit to Stand: Min guard         General transfer comment: min/guard for safety, unsteady without UE support/assist  Ambulation/Gait Ambulation/Gait assistance: Min guard Ambulation Distance (Feet): 400 Feet Assistive device: Rolling walker (2 wheeled) Gait Pattern/deviations: Step-through pattern;Decreased stride length     General Gait Details: pt steady with RW, pt reports SOB end of ambulation, monitored SpO2 during ambulation and remained in 90s on 3L O2 , SpO2 97% upon return to room   Stairs            Wheelchair Mobility    Modified Rankin (Stroke Patients Only)       Balance                                    Cognition Arousal/Alertness: Awake/alert Behavior During Therapy: WFL for tasks assessed/performed Overall Cognitive Status: Within Functional Limits for tasks assessed                      Exercises      General Comments        Pertinent Vitals/Pain Pain Assessment: No/denies pain    Home Living                      Prior Function            PT Goals (current goals can now be found in the care plan section) Progress towards PT goals: Progressing toward goals    Frequency    Min 3X/week      PT Plan Current plan remains appropriate    Co-evaluation             End of Session Equipment Utilized During Treatment: Oxygen Activity Tolerance: Patient tolerated treatment well Patient left: in chair;with chair alarm set;with call bell/phone within reach;with family/visitor present     Time: [id number] PT Time Calculation (min) (ACUTE ONLY): 13 min  Charges:  $Gait Training: 8-22 mins                    G Codes:      Julieanne Hadsall,KATHrine E 06/28/2016, 1:08 PM Carmelia Bake, PT, DPT 06/28/2016 Pager: [phone]

## 2016-06-05 NOTE — Progress Notes (Signed)
[facility] PROGRESS NOTE  Eric [name] [id number] DOB: 01-Aug-1926 DOA: 06/02/2016   PCP: [name], MD     Assessment/Plan: Principal Problem:   COPD with acute exacerbation [facility]) Active Problems:   Type 2 diabetes mellitus, controlled ([place])   Obstructive sleep apnea   Chronic diastolic heart failure ([place])   CAD (coronary artery disease) of artery bypass graft   HCAP (healthcare-associated pneumonia)   Obesity (BMI 30-39.9)   Anemia in chronic illness   Acute respiratory failure with hypoxia [facility])   Eric [name] is a 82 y.o. male with a history of CAD s/p CABG, chronic diastolic CHF, COPD on 2L at home, OSA, CML, HTN, diverticulitis s/p colostomy, who presented 12/26 for worsening shortness of breath and productive cough.He was recently discharged after an admission for COPD exacerbation triggered by RSV and parainfluenza viral infections. He completed prednisone, continued lasix, and was sent with home oxygen but notes he was unable to walk out to the mailbox, or up/down stairs like he had previously despite oxygen.TR[facility] was called to admit for acute respiratory failure due to COPD exacerbation  Assessment and plan Acute hypoxemic respiratory failure due to COPD exacerbation from viral and possibly bacterial pneumonia: Confirmed by CT scan Recent +RSV and parainfluenza on previous admission, suspect this is prolonged recovery for pt with poor pulmonary reserve due to OSA, OHS, COPD.  CXR with bibasilar opacities. No SIRS. [facility] Dr. Annamaria Boots. Continue levaquin , started 12/26 (previously had azithromycin) , now switched to by mouth Continue IV steroids  with slow/prolonged  taper - Duonebs q6h, albuterol neb q3h prn. Start budesonide nebs - Blood and sputum cultures - CPAP No PE on CTA On CPAP daily at bedtime Needs to follow up with Dr Annamaria Boots post DC   Chronic diastolic heart failure: Appears euvolemic with  BNP 112.4. Recent echo with mild LVH, preserved ejection fraction, grade 1 diastolic dysfunction. Most recent echo 12/12 - Continue lasix - Monitor BMP  T2DM: HbA1c 5.8%, anticipate hyperglycemia with steroids - CBG/SSI qAC/HS  Hypertension: Chronic, stable. Elevated on admission though took no medications today.  - Restart home medications  CAD:s/p CABG with sick sinus s/p pacemaker. He had chest pain, though since resolved. Initial troponin negative.     CML: Followed by Dr. Alvy Bimler - Hold dasatinib as advised during previous admission - Dr. Alvy Bimler added to treatment team.  Anemia: Chronic, normocytic due to malignancy. hgb 8.5 on admission, near baseline.  - Monitor signs of bleeding and CBC - Consider transfusion if hgb remains < 8 given CAD and respiratory failure  History of diverticulitis: s/p colostomy anticipated as permanent.    DVT prophylaxsis Lovenox  Code Status:  Full code    Family Communication: Discussed in detail with the patient, all imaging results, lab results explained to the patient   Disposition Plan: Anticipate discharge 12/30, if improving       Consultants:  None  Procedures:  None  Antibiotics: Anti-infectives    Start     Dose/Rate Route Frequency Ordered Stop   06/05/16 1000  levofloxacin (LEVAQUIN) tablet 500 mg     500 mg Oral Daily 06/04/16 1020 06/07/16 0959   06/03/16 1000  levofloxacin (LEVAQUIN) IVPB 500 mg  Status:  Discontinued     500 mg 100 mL/hr over 60 Minutes Intravenous Every 24 hours 06/02/16 1315 06/04/16 1020   06/02/16 0930  levofloxacin (LEVAQUIN) IVPB 750 mg     750  mg 100 mL/hr over 90 Minutes Intravenous  Once 06/02/16 0916 06/02/16 1154         HPI/Subjective: Less wheezing, had sob this am , attributes to "wrong pressures on CPAP machine "  Objective: Vitals:   06/04/16 2141 06/05/16 0315 06/05/16 0500 06/05/16 0558  BP: (!) 156/65   (!) 155/56  Pulse: 94   81  Resp: 16   16  Temp:  97.6 F (36.4 C)   98 F (36.7 C)  TempSrc: Oral   Oral  SpO2: 95% 97%  98%  Weight:   100.4 kg (221 lb 5.5 oz)   Height:        Intake/Output Summary (Last 24 hours) at 06/05/16 [id number] Last data filed at 06/04/16 2138  Gross per 24 hour  Intake             1083 ml  Output             1200 ml  Net             -117 ml    Exam:  Examination:  General exam: Appears calm and comfortable  Respiratory system: Clear to auscultation. Respiratory effort normal. Cardiovascular system: S1 & S2 heard, RRR. No JVD, murmurs, rubs, gallops or clicks. No pedal edema. Gastrointestinal system: Abdomen is nondistended, soft and nontender. No organomegaly or masses felt. Normal bowel sounds heard. Central nervous system: Alert and oriented. No focal neurological deficits. Extremities: Symmetric 5 x 5 power. Skin: No rashes, lesions or ulcers Psychiatry: Judgement and insight appear normal. Mood & affect appropriate.     Data Reviewed: I have personally reviewed following labs and imaging studies  Micro Results Recent Results (from the past 240 hour(s))  Culture, blood (routine x 2)     Status: None (Preliminary result)   Collection Time: 06/02/16  1:10 PM  Result Value Ref Range Status   Specimen Description BLOOD RIGHT ARM  Final   Special Requests BOTTLES DRAWN AEROBIC AND ANAEROBIC [place]  Final   Culture   Final    NO GROWTH 2 DAYS Performed at [facility]    Report Status PENDING  Incomplete  Culture, blood (routine x 2)     Status: None (Preliminary result)   Collection Time: 06/02/16  1:22 PM  Result Value Ref Range Status   Specimen Description BLOOD LEFT ARM  Final   Special Requests IN PEDIATRIC BOTTLE 4CC  Final   Culture   Final    NO GROWTH 2 DAYS Performed at [facility]    Report Status PENDING  Incomplete    Radiology Reports Dg Chest 2 View  Result Date: 06/02/2016 CLINICAL DATA:  Worsening cough, congestion shortness of breath over the past few  days. EXAM: CHEST  2 VIEW COMPARISON:  PA and lateral chest 05/19/2016, 12/01/2015 and 05/27/2015. FINDINGS: The lungs are emphysematous. Bibasilar airspace disease is identified. No pneumothorax. Small left pleural effusion noted. Heart size is upper normal. The patient is status post CABG with a pacing device in place. IMPRESSION: Bibasilar airspace disease could be due to atelectasis or pneumonia. Emphysema. Electronically Signed   By: Inge Rise M.D.   On: 06/02/2016 08:36   Dg Chest 2 View  Result Date: 05/19/2016 CLINICAL DATA:  Several days of chest congestion and cough with some chest pain and shortness of breath. History of COPD, ischemic cardiomyopathy, diabetes, former smoker. EXAM: CHEST  2 VIEW COMPARISON:  PA and lateral chest x-ray of December 01, 2015 FINDINGS: The  lungs are well-expanded. The interstitial markings are coarse. There is no alveolar infiltrate or pleural effusion. The cardiac silhouette is enlarged. The central pulmonary vascularity is mildly prominent but there is no cephalization. There is calcification in the wall of the thoracic aorta. There are post CABG changes. The ICD is in stable position. There is multilevel degenerative disc disease of the thoracic spine. IMPRESSION: COPD. No focal pneumonia. Cardiomegaly with mild central pulmonary vascular congestion. No definite pulmonary edema. Thoracic aortic atherosclerosis. Electronically Signed   By: David  Martinique M.D.   On: 05/19/2016 09:49   Ct Angio Chest Pe W Or Wo Contrast  Result Date: 06/03/2016 CLINICAL DATA:  Cough and shortness of breath for weeks. EXAM: CT ANGIOGRAPHY CHEST WITH CONTRAST TECHNIQUE: Multidetector CT imaging of the chest was performed using the standard protocol during bolus administration of intravenous contrast. Multiplanar CT image reconstructions and MIPs were obtained to evaluate the vascular anatomy. CONTRAST:  73 mL of Isovue 370 intravenous contrast COMPARISON:  Chest radiograph,  06/02/2016 FINDINGS: Cardiovascular: There is satisfactory opacification of the pulmonary arteries. However, the study is limited by respiratory motion which limits evaluation of the segmental and subsegmental pulmonary arteries, particularly in the lower lobes. Allowing for this limitation, there is no evidence of a pulmonary embolus. The great vessels are normal in caliber. Atherosclerotic calcifications are noted along the thoracic aorta. No dissection. There are dense coronary artery calcifications. The heart is mildly enlarged. There changes from previous CABG surgery. Mediastinum/Nodes: No neck base, axillary, mediastinal or hilar masses or adenopathy. Lungs/Pleura: There is bilateral lower lobe bronchial wall thickening with peribronchovascular airspace opacity, most confluent in the posterior lung bases. Milder opacity lies at the bases of the right middle lobe and left upper lobe lingula, consistent with atelectasis. Mild paraseptal and centrilobular emphysema is noted in the upper lobes. There is no convincing pulmonary edema. No significant pleural effusions. Upper Abdomen: Low-density liver lesions consistent with cysts. Mild bilateral renal cortical thinning. Partial fatty replacement of the pancreas. No acute findings. Musculoskeletal: No fracture or acute finding. No osteoblastic or osteolytic lesions. Degenerative changes noted throughout the visualized spine. Bones are diffusely demineralized. Review of the MIP images confirms the above findings. IMPRESSION: 1. Study mildly limited by respiratory motion. Allowing for this, there is no evidence of a pulmonary embolus. 2. Lower lobe bronchial wall thickening with associated patchy peribronchovascular opacity and more confluent opacity at the posterior lung bases. Suspect a combination of bronchitis and/or bronchopneumonia and atelectasis. No evidence of pulmonary edema. 3. Mild cardiomegaly, changes from previous CABG surgery and dense coronary artery  calcifications. Electronically Signed   By: Lajean Manes M.D.   On: 06/03/2016 12:20     CBC  Recent Labs Lab 06/02/16 0900 06/02/16 1321 06/03/16 0453  WBC 7.3 6.7 3.5*  HGB 8.5* 9.3* 8.4*  HCT 26.2* 28.4* 25.7*  PLT 178 170 164  MCV 100.0 99.6 97.7  MCH 32.4 32.6 31.9  MCHC 32.4 32.7 32.7  RDW 15.0 14.9 14.8    Chemistries   Recent Labs Lab 06/02/16 0900 06/02/16 1321 06/03/16 0453  NA 136  --  135  K 3.9  --  4.5  CL 104  --  102  CO2 26  --  22  GLUCOSE 138*  --  178*  BUN 13  --  23*  CREATININE 1.14 1.08 1.06  CALCIUM 9.3  --  9.2   ------------------------------------------------------------------------------------------------------------------ estimated creatinine clearance is 54.4 mL/min (by C-G formula based on SCr of 1.06 mg/dL). ------------------------------------------------------------------------------------------------------------------  No results for input(s): HGBA1C in the last 72 hours. ------------------------------------------------------------------------------------------------------------------ No results for input(s): CHOL, HDL, LDLCALC, TRIG, CHOLHDL, LDLDIRECT in the last 72 hours. ------------------------------------------------------------------------------------------------------------------ No results for input(s): TSH, T4TOTAL, T3FREE, THYROIDAB in the last 72 hours.  Invalid input(s): FREET3 ------------------------------------------------------------------------------------------------------------------ No results for input(s): VITAMINB12, FOLATE, FERRITIN, TIBC, IRON, RETICCTPCT in the last 72 hours.  Coagulation profile No results for input(s): INR, PROTIME in the last 168 hours.  No results for input(s): DDIMER in the last 72 hours.  Cardiac Enzymes No results for input(s): CKMB, TROPONINI, MYOGLOBIN in the last 168 hours.  Invalid input(s):  CK ------------------------------------------------------------------------------------------------------------------ Invalid input(s): POCBNP   CBG:  Recent Labs Lab 06/04/16 0809 06/04/16 1201 06/04/16 1659 06/04/16 2139 06/05/16 0701  GLUCAP 169* 226* 128* 233* 164*       Studies: Ct Angio Chest Pe W Or Wo Contrast  Result Date: 06/03/2016 CLINICAL DATA:  Cough and shortness of breath for weeks. EXAM: CT ANGIOGRAPHY CHEST WITH CONTRAST TECHNIQUE: Multidetector CT imaging of the chest was performed using the standard protocol during bolus administration of intravenous contrast. Multiplanar CT image reconstructions and MIPs were obtained to evaluate the vascular anatomy. CONTRAST:  73 mL of Isovue 370 intravenous contrast COMPARISON:  Chest radiograph, 06/02/2016 FINDINGS: Cardiovascular: There is satisfactory opacification of the pulmonary arteries. However, the study is limited by respiratory motion which limits evaluation of the segmental and subsegmental pulmonary arteries, particularly in the lower lobes. Allowing for this limitation, there is no evidence of a pulmonary embolus. The great vessels are normal in caliber. Atherosclerotic calcifications are noted along the thoracic aorta. No dissection. There are dense coronary artery calcifications. The heart is mildly enlarged. There changes from previous CABG surgery. Mediastinum/Nodes: No neck base, axillary, mediastinal or hilar masses or adenopathy. Lungs/Pleura: There is bilateral lower lobe bronchial wall thickening with peribronchovascular airspace opacity, most confluent in the posterior lung bases. Milder opacity lies at the bases of the right middle lobe and left upper lobe lingula, consistent with atelectasis. Mild paraseptal and centrilobular emphysema is noted in the upper lobes. There is no convincing pulmonary edema. No significant pleural effusions. Upper Abdomen: Low-density liver lesions consistent with cysts. Mild  bilateral renal cortical thinning. Partial fatty replacement of the pancreas. No acute findings. Musculoskeletal: No fracture or acute finding. No osteoblastic or osteolytic lesions. Degenerative changes noted throughout the visualized spine. Bones are diffusely demineralized. Review of the MIP images confirms the above findings. IMPRESSION: 1. Study mildly limited by respiratory motion. Allowing for this, there is no evidence of a pulmonary embolus. 2. Lower lobe bronchial wall thickening with associated patchy peribronchovascular opacity and more confluent opacity at the posterior lung bases. Suspect a combination of bronchitis and/or bronchopneumonia and atelectasis. No evidence of pulmonary edema. 3. Mild cardiomegaly, changes from previous CABG surgery and dense coronary artery calcifications. Electronically Signed   By: Lajean Manes M.D.   On: 06/03/2016 12:20      Lab Results  Component Value Date   HGBA1C 5.8 (H) 05/20/2016   HGBA1C 6.9 (H) 05/27/2015   HGBA1C 6.5 (H) 11/22/2014   Lab Results  Component Value Date   MICROALBUR 0.8 09/30/2012   LDLCALC 55 05/27/2015   CREATININE 1.06 06/03/2016       Scheduled Meds: . aspirin EC  81 mg Oral QPM  . budesonide (PULMICORT) nebulizer solution  0.5 mg Nebulization BID  . enoxaparin (LOVENOX) injection  40 mg Subcutaneous Q24H  . feeding supplement (ENSURE ENLIVE)  237 mL Oral BID BM  .  fluticasone  2 spray Each Nare Daily  . furosemide  20 mg Oral Daily  . insulin aspart  0-5 Units Subcutaneous QHS  . insulin aspart  0-9 Units Subcutaneous TID WC  . ipratropium-albuterol  3 mL Nebulization Q6H  . irbesartan  150 mg Oral Daily  . levofloxacin  500 mg Oral Daily  . methylPREDNISolone (SOLU-MEDROL) injection  60 mg Intravenous Q12H  . pantoprazole  40 mg Oral Daily  . pravastatin  60 mg Oral Daily  . sodium chloride flush  3 mL Intravenous Q12H   Continuous Infusions:    LOS: 2 days    Time spent: >30 MINS     [facility]  [facility] Pager [phone]. If 7PM-7AM, please contact night-coverage at [email or web address], password [facility] 06/05/2016, 8:19 AM  LOS: 2 days

## 2016-06-05 NOTE — Progress Notes (Signed)
Pharmacy Antibiotic Note  Eric [name] is a 82 y.o. male admitted on 06/02/2016 with CAP.  Pharmacy has been consulted for Levaquin dosing.  Plan: Continue Levaquin 500 mg q24h to complete 5 day course.  Switched to PO yesterday.  Height: 5\' 6"  (167.6 cm) Weight: 221 lb 5.5 oz (100.4 kg) IBW/kg (Calculated) : 63.8  Temp (24hrs), Avg:97.9 F (36.6 C), Min:97.6 F (36.4 C), Max:98.2 F (36.8 C)   Recent Labs Lab 06/02/16 0900 06/02/16 1321 06/03/16 0453 06/05/16 0823  WBC 7.3 6.7 3.5* 6.7  CREATININE 1.14 1.08 1.06 1.16    Estimated Creatinine Clearance: 49.8 mL/min (by C-G formula based on SCr of 1.16 mg/dL).    Allergies  Allergen Reactions  . Actos [Pioglitazone Hydrochloride] Other (See Comments)    "felt funny, drowsy, and weak":  [name] Buprenorphine Hcl Nausea And Vomiting  . Celebrex [Celecoxib] Other (See Comments)    "felt funny"  . Demerol Palpitations and Other (See Comments)    Increased BP  . Meperidine Palpitations    Other reaction(s): Other (See Comments) Increased BP  . Morphine And Related Nausea And Vomiting  . Ciprofloxacin Other (See Comments)    arthralgia  . Metformin Nausea And Vomiting  . Zocor [Simvastatin] Other (See Comments)    Makes pt very drowsy    Antimicrobials this admission:  Levaquin 12/26 >>  Dose adjustments this admission:  ---  Microbiology results:  12/26 BCx: NGTD  Thank you for allowing pharmacy to be a part of this patient's care.  Hershal Coria 06/05/2016 2:40 PM

## 2016-06-06 DIAGNOSIS — E669 Obesity, unspecified: Secondary | ICD-10-CM

## 2016-06-06 DIAGNOSIS — J9621 Acute and chronic respiratory failure with hypoxia: Secondary | ICD-10-CM

## 2016-06-06 DIAGNOSIS — I5032 Chronic diastolic (congestive) heart failure: Secondary | ICD-10-CM

## 2016-06-06 DIAGNOSIS — J441 Chronic obstructive pulmonary disease with (acute) exacerbation: Principal | ICD-10-CM

## 2016-06-06 DIAGNOSIS — J189 Pneumonia, unspecified organism: Secondary | ICD-10-CM

## 2016-06-06 LAB — GLUCOSE, CAPILLARY
GLUCOSE-CAPILLARY: 134 mg/dL — AB (ref 65–99)
GLUCOSE-CAPILLARY: 206 mg/dL — AB (ref 65–99)

## 2016-06-06 MED ORDER — PREDNISONE 10 MG PO TABS: ORAL_TABLET | ORAL | 0 refills | Status: DC

## 2016-06-06 MED ORDER — ENSURE ENLIVE PO LIQD: 237.0000 mL | Freq: Two times a day (BID) | ORAL | 12 refills | Status: DC

## 2016-06-06 MED ORDER — GUAIFENESIN-DM 100-10 MG/5ML PO SYRP: 5.0000 mL | ORAL_SOLUTION | ORAL | 0 refills | Status: DC | PRN

## 2016-06-06 NOTE — Discharge Summary (Addendum)
Discharge Summary  Eric [name] [id number] DOB: 1927-05-26  PCP: [name], MD  Admit date: 06/02/2016 Discharge date: 06/06/2016  Time spent: 25 minutes   Recommendations for Outpatient Follow-up:  1. New medication: Prednisone taper 2. Patient will follow-up with his PCP in the next 2-3 weeks   Discharge Diagnoses:  Active Hospital Problems   Diagnosis Date Noted  . COPD with acute exacerbation ([place]) 06/02/2016  . Acute respiratory failure with hypoxia ([place]) 05/22/2016  . Anemia in chronic illness 07/18/2015  . Obesity (BMI 30-39.9) 12/08/2014  . HCAP (healthcare-associated pneumonia) 12/07/2014  . CAD (coronary artery disease) of artery bypass graft 11/28/2014  . Chronic diastolic heart failure ([place]) 11/24/2014  . Obstructive sleep apnea 04/11/2011  . Type 2 diabetes mellitus, controlled ([place]) 01/18/2009    Resolved Hospital Problems   Diagnosis Date Noted Date Resolved  No resolved problems to display.    Discharge Condition: Improved, being discharged home   Diet recommendation: Heart healthy   Vitals:   06/06/16 1333 06/06/16 1342  BP: (!) 160/74   Pulse: 91 82  Resp: 20 17  Temp: 97.5 F (36.4 C)     History of present illness:  82 year old male past mental history of CAD, chronic diastolic heart failure, COPD on 2 L nasal cannula admitted 12/26 for worsening shortness of breath and productive cough. Patient recently discharged after being admitted for COPD exacerbation triggered by RSV. Patient completed prednisone and continue home oxygen, became dyspneic with simple ambulation. In the emergency room, patient found to have bibasilar opacities. Patient started on IV steroids, nebulizers and oxygen and antibiotics.  Hospital Course:  Principal Problem:   Acute on chronic respiratory failure with hypoxia secondary to COPD with acute exacerbation ([place]): Steroids able to be weaned down. Patient discharged on prolonged taper. Continued on  nebulizers. Completed 5 days of antibiotics during this hospitalization. No anabolic son discharge needed. Active Problems:   Type 2 diabetes mellitus, controlled ([place]): A1c of 5.8. Sliding-scale steroids. Much improved as changed over to by mouth prednisone and tapered down.   Obstructive sleep apnea   Chronic diastolic heart failure ([place]): Euvolemic during his hospitalization. BNP normal. Grade 1 diastolic dysfunction seen on recent echo   CAD (coronary artery disease) of artery bypass graft: Remain stable   HCAP (healthcare-associated pneumonia): Ruled in & treated as HCAP given recent hospitalization prior to this one.  Went through full course of antibiotics.   Obesity (BMI 30-39.9)   Anemia in chronic illness: Stable    Procedures:  None   Consultations:  None   Discharge Exam: BP (!) 160/74 (BP Location: Right Arm)   Pulse 82   Temp 97.5 F (36.4 C) (Oral)   Resp 17   Ht 5\' 6"  (1.676 m)   Wt 99.8 kg (220 lb 0.3 oz)   SpO2 96%   BMI 35.51 kg/m   General: Alert and oriented 3  Cardiovascular: Regular rate and rhythm, S1-S2  Respiratory: Decreased breath sounds throughout, otherwise clear to auscultation bilaterally   Discharge Instructions You were cared for by a hospitalist during your hospital stay. If you have any questions about your discharge medications or the care you received while you were in the hospital after you are discharged, you can call the unit and asked to speak with the hospitalist on call if the hospitalist that took care of you is not available. Once you are discharged, your primary care physician will handle any further medical issues. Please note that NO REFILLS for  any discharge medications will be authorized once you are discharged, as it is imperative that you return to your primary care physician (or establish a relationship with a primary care physician if you do not have one) for your aftercare needs so that they can reassess your need for  medications and monitor your lab values.  Discharge Instructions    AMB Referral to [facility]    Complete by:  As directed    Please assign to [facility] for moderate risk. Multiple co-morbidities and lives alone. Please see liaison notes. Also please assess need for [facility] LCSW for potential transportation needs. Currently at [facility] Written consent obtained. Thanks. [name], [place], RN,BSN-[facility] [id number]   Reason for consult:  Please assign to [facility] RNCM   Diagnoses of:  COPD/ Pneumonia   Expected date of contact:  1-3 days (reserved for hospital discharges)   Diet - low sodium heart healthy    Complete by:  As directed    Increase activity slowly    Complete by:  As directed      Allergies as of 06/06/2016      Reactions   Actos [pioglitazone Hydrochloride] Other (See Comments)   "felt funny, drowsy, and weak":   Buprenorphine Hcl Nausea And Vomiting   Celebrex [celecoxib] Other (See Comments)   "felt funny"   Demerol Palpitations, Other (See Comments)   Increased BP   Meperidine Palpitations   Other reaction(s): Other (See Comments) Increased BP   Morphine And Related Nausea And Vomiting   Ciprofloxacin Other (See Comments)   arthralgia   Metformin Nausea And Vomiting   Zocor [simvastatin] Other (See Comments)   Makes pt very drowsy      Medication List    TAKE these medications   acetaminophen 500 MG tablet Commonly known as:  TYLENOL Take 1,000 mg by mouth every 6 (six) hours as needed for mild pain or fever.   albuterol-ipratropium 18-103 MCG/ACT inhaler Commonly known as:  COMBIVENT Inhale 1 puff into the lungs every 6 (six) hours as needed for wheezing or shortness of breath.   aspirin 81 MG tablet Take 81 mg by mouth every evening.   dasatinib 70 MG tablet Commonly known as:  SPRYCEL Take 1 tablet (70 mg total) by mouth daily.   feeding supplement (ENSURE ENLIVE) Liqd Take 237 mLs by  mouth 2 (two) times daily between meals.   fluticasone 50 MCG/ACT nasal spray Commonly known as:  FLONASE Place 2 sprays into both nostrils daily. Allergies What changed:  when to take this  reasons to take this  additional instructions   furosemide 20 MG tablet Commonly known as:  LASIX Take 1 tablet (20 mg total) by mouth daily.   guaiFENesin-dextromethorphan 100-10 MG/5ML syrup Commonly known as:  ROBITUSSIN DM Take 5 mLs by mouth every 4 (four) hours as needed for cough.   HYDROcodone-acetaminophen 7.5-325 MG tablet Commonly known as:  NORCO Take 1 tablet by mouth every 6 (six) hours as needed for moderate pain.   nitroGLYCERIN 0.4 MG SL tablet Commonly known as:  NITROSTAT Place 0.4 mg under the tongue every 5 (five) minutes as needed for chest pain. Reported on 12/12/2015   ONE TOUCH ULTRA TEST test strip Generic drug:  glucose blood USE TO CHECK BLOOD SUGARS ONCE A DAY   pravastatin 20 MG tablet Commonly known as:  PRAVACHOL Take 3 tablets (60 mg total) by mouth daily.   predniSONE 10 MG tablet Commonly known as:  DELTASONE 60 mg po daily x 2 days, then 50 mg po daily x 2 days, then decrease by 10mg  every 2 days.   PROVENTIL HFA 108 (90 Base) MCG/ACT inhaler Generic drug:  albuterol Inhale 2 puffs into the lungs every 4 (four) hours as needed for shortness of breath. Wheezing   albuterol (2.5 MG/3ML) 0.083% nebulizer solution Commonly known as:  PROVENTIL USE 1 VIAL WITH NEBULIZER EVERY 4 HOURS AS NEEDED FOR WHEEZING   ranitidine 300 MG tablet Commonly known as:  ZANTAC Take 300 mg by mouth at bedtime.   SYSTANE 0.4-0.3 % Soln Generic drug:  Polyethyl Glycol-Propyl Glycol Apply 2 drops to eye 3 (three) times daily as needed (dry eyes).   triamcinolone cream 0.1 % Commonly known as:  KENALOG Apply 1 application topically 3 (three) times daily as needed (dry skin.).   valsartan 160 MG tablet Commonly known as:  DIOVAN Take 1 tablet (160 mg total) by  mouth daily. What changed:  when to take this      Allergies  Allergen Reactions  . Actos [Pioglitazone Hydrochloride] Other (See Comments)    "felt funny, drowsy, and weak":  [name] Buprenorphine Hcl Nausea And Vomiting  . Celebrex [Celecoxib] Other (See Comments)    "felt funny"  . Demerol Palpitations and Other (See Comments)    Increased BP  . Meperidine Palpitations    Other reaction(s): Other (See Comments) Increased BP  . Morphine And Related Nausea And Vomiting  . Ciprofloxacin Other (See Comments)    arthralgia  . Metformin Nausea And Vomiting  . Zocor [Simvastatin] Other (See Comments)    Makes pt very drowsy      The results of significant diagnostics from this hospitalization (including imaging, microbiology, ancillary and laboratory) are listed below for reference.    Significant Diagnostic Studies: Dg Chest 2 View  Result Date: 06/02/2016 CLINICAL DATA:  Worsening cough, congestion shortness of breath over the past few days. EXAM: CHEST  2 VIEW COMPARISON:  PA and lateral chest 05/19/2016, 12/01/2015 and 05/27/2015. FINDINGS: The lungs are emphysematous. Bibasilar airspace disease is identified. No pneumothorax. Small left pleural effusion noted. Heart size is upper normal. The patient is status post CABG with a pacing device in place. IMPRESSION: Bibasilar airspace disease could be due to atelectasis or pneumonia. Emphysema. Electronically Signed   By: Inge Rise M.D.   On: 06/02/2016 08:36   Dg Chest 2 View  Result Date: 05/19/2016 CLINICAL DATA:  Several days of chest congestion and cough with some chest pain and shortness of breath. History of COPD, ischemic cardiomyopathy, diabetes, former smoker. EXAM: CHEST  2 VIEW COMPARISON:  PA and lateral chest x-ray of December 01, 2015 FINDINGS: The lungs are well-expanded. The interstitial markings are coarse. There is no alveolar infiltrate or pleural effusion. The cardiac silhouette is enlarged. The central pulmonary  vascularity is mildly prominent but there is no cephalization. There is calcification in the wall of the thoracic aorta. There are post CABG changes. The ICD is in stable position. There is multilevel degenerative disc disease of the thoracic spine. IMPRESSION: COPD. No focal pneumonia. Cardiomegaly with mild central pulmonary vascular congestion. No definite pulmonary edema. Thoracic aortic atherosclerosis. Electronically Signed   By: David  Martinique M.D.   On: 05/19/2016 09:49   Ct Angio Chest Pe W Or Wo Contrast  Result Date: 06/03/2016 CLINICAL DATA:  Cough and shortness of breath for weeks. EXAM: CT ANGIOGRAPHY CHEST WITH CONTRAST TECHNIQUE: Multidetector CT imaging of the chest was performed using  the standard protocol during bolus administration of intravenous contrast. Multiplanar CT image reconstructions and MIPs were obtained to evaluate the vascular anatomy. CONTRAST:  73 mL of Isovue 370 intravenous contrast COMPARISON:  Chest radiograph, 06/02/2016 FINDINGS: Cardiovascular: There is satisfactory opacification of the pulmonary arteries. However, the study is limited by respiratory motion which limits evaluation of the segmental and subsegmental pulmonary arteries, particularly in the lower lobes. Allowing for this limitation, there is no evidence of a pulmonary embolus. The great vessels are normal in caliber. Atherosclerotic calcifications are noted along the thoracic aorta. No dissection. There are dense coronary artery calcifications. The heart is mildly enlarged. There changes from previous CABG surgery. Mediastinum/Nodes: No neck base, axillary, mediastinal or hilar masses or adenopathy. Lungs/Pleura: There is bilateral lower lobe bronchial wall thickening with peribronchovascular airspace opacity, most confluent in the posterior lung bases. Milder opacity lies at the bases of the right middle lobe and left upper lobe lingula, consistent with atelectasis. Mild paraseptal and centrilobular  emphysema is noted in the upper lobes. There is no convincing pulmonary edema. No significant pleural effusions. Upper Abdomen: Low-density liver lesions consistent with cysts. Mild bilateral renal cortical thinning. Partial fatty replacement of the pancreas. No acute findings. Musculoskeletal: No fracture or acute finding. No osteoblastic or osteolytic lesions. Degenerative changes noted throughout the visualized spine. Bones are diffusely demineralized. Review of the MIP images confirms the above findings. IMPRESSION: 1. Study mildly limited by respiratory motion. Allowing for this, there is no evidence of a pulmonary embolus. 2. Lower lobe bronchial wall thickening with associated patchy peribronchovascular opacity and more confluent opacity at the posterior lung bases. Suspect a combination of bronchitis and/or bronchopneumonia and atelectasis. No evidence of pulmonary edema. 3. Mild cardiomegaly, changes from previous CABG surgery and dense coronary artery calcifications. Electronically Signed   By: Lajean Manes M.D.   On: 06/03/2016 12:20    Microbiology: Recent Results (from the past 240 hour(s))  Culture, blood (routine x 2)     Status: None (Preliminary result)   Collection Time: 06/02/16  1:10 PM  Result Value Ref Range Status   Specimen Description BLOOD RIGHT ARM  Final   Special Requests BOTTLES DRAWN AEROBIC AND ANAEROBIC [place]  Final   Culture   Final    NO GROWTH 4 DAYS Performed at [facility]    Report Status PENDING  Incomplete  Culture, blood (routine x 2)     Status: None (Preliminary result)   Collection Time: 06/02/16  1:22 PM  Result Value Ref Range Status   Specimen Description BLOOD LEFT ARM  Final   Special Requests IN PEDIATRIC BOTTLE 4CC  Final   Culture   Final    NO GROWTH 4 DAYS Performed at [facility]    Report Status PENDING  Incomplete     Labs: Basic Metabolic Panel:  Recent Labs Lab 06/02/16 0900 06/02/16 1321 06/03/16 0453  06/05/16 0823  NA 136  --  135 136  K 3.9  --  4.5 4.5  CL 104  --  102 105  CO2 26  --  22 22  GLUCOSE 138*  --  178* 227*  BUN 13  --  23* 41*  CREATININE 1.14 1.08 1.06 1.16  CALCIUM 9.3  --  9.2 9.2   Liver Function Tests:  Recent Labs Lab 06/05/16 0823  AST 28  ALT 22  ALKPHOS 47  BILITOT 0.3  PROT 6.4*  ALBUMIN 3.4*   No results for input(s): LIPASE, AMYLASE in  the last 168 hours. No results for input(s): AMMONIA in the last 168 hours. CBC:  Recent Labs Lab 06/02/16 0900 06/02/16 1321 06/03/16 0453 06/05/16 0823  WBC 7.3 6.7 3.5* 6.7  HGB 8.5* 9.3* 8.4* 8.6*  HCT 26.2* 28.4* 25.7* 26.3*  MCV 100.0 99.6 97.7 97.8  PLT 178 170 164 185   Cardiac Enzymes: No results for input(s): CKTOTAL, CKMB, CKMBINDEX, TROPONINI in the last 168 hours. BNP: BNP (last 3 results)  Recent Labs  12/01/15 2040 05/19/16 1001 06/02/16 0900  BNP 538.5* 86.6 112.4*    ProBNP (last 3 results)  Recent Labs  11/27/15 1621  PROBNP 296.0*    CBG:  Recent Labs Lab 06/05/16 1139 06/05/16 1740 06/05/16 2203 06/06/16 0743 06/06/16 1202  GLUCAP 179* 165* 168* 134* 206*       Signed:  Annita Brod, MD [facility] 06/06/2016, 4:06 PM

## 2016-06-06 NOTE — Progress Notes (Signed)
Pt discharged to home.DC instructions given. No concerns voiced. Pt reminded  to stop by [facility] and pick up meds. Left unit in wheelchair pushed by this RN. Left in good condition. Picked up by daughter. VWilliams,rn.

## 2016-06-07 LAB — CULTURE, BLOOD (ROUTINE X 2)
CULTURE: NO GROWTH
Culture: NO GROWTH

## 2016-06-09 ENCOUNTER — Ambulatory Visit (INDEPENDENT_AMBULATORY_CARE_PROVIDER_SITE_OTHER): Payer: Medicare Other | Admitting: Internal Medicine

## 2016-06-09 ENCOUNTER — Encounter: Payer: Self-pay | Admitting: *Deleted

## 2016-06-09 ENCOUNTER — Other Ambulatory Visit: Payer: Self-pay | Admitting: *Deleted

## 2016-06-09 ENCOUNTER — Encounter: Payer: Self-pay | Admitting: Internal Medicine

## 2016-06-09 DIAGNOSIS — R42 Dizziness and giddiness: Secondary | ICD-10-CM

## 2016-06-09 DIAGNOSIS — I2510 Atherosclerotic heart disease of native coronary artery without angina pectoris: Secondary | ICD-10-CM

## 2016-06-09 DIAGNOSIS — J9601 Acute respiratory failure with hypoxia: Secondary | ICD-10-CM | POA: Diagnosis not present

## 2016-06-09 DIAGNOSIS — I5032 Chronic diastolic (congestive) heart failure: Secondary | ICD-10-CM | POA: Diagnosis not present

## 2016-06-09 DIAGNOSIS — J441 Chronic obstructive pulmonary disease with (acute) exacerbation: Secondary | ICD-10-CM

## 2016-06-09 MED ORDER — ALBUTEROL SULFATE HFA 108 (90 BASE) MCG/ACT IN AERS: 2.0000 | INHALATION_SPRAY | RESPIRATORY_TRACT | 1 refills | Status: DC | PRN

## 2016-06-09 NOTE — Progress Notes (Signed)
Pre visit review using our clinic review tool, if applicable. No additional management support is needed unless otherwise documented below in the visit note. 

## 2016-06-09 NOTE — Patient Outreach (Signed)
Transition of care call #1. Pt was very reluctant to talk to me initially, stating he did not need anyone coming to his home. I assured him that I am calling only to see how he is doing since he came home and that I would like to call him weekly for transition of care calls over the net month. He gladly answered the initial transition of care call template questions. He reports he resides in a retirement community and he has help available there for transportation and housekeeping. He resides alone. He reports he has CHF and COPD and he knows how to manage both problems. He has his meds and takes them. He has a scale and weighs daily. He avoids salt. He is not sure if home health is going to come out. He says he has one problem and that is that he cannot carry the big O2 tanks he has that were provided by [facility]. He has had abdominal surgery for colon cancer and has a large hernia and is not supposed to lift heavy objects. He says they told him that is the only thing they have available. He says he has to go without O2 when he is walking around and that is when he does get SOB. He has called for delivery of a smaller system from another company.   I have reviewed the COPD and CHF Action plans and requested that he call me or one of his other providers if he gets in the yellow zone. He said he will. I advised him I will call him weekly throughout this month. I will send him some information about [facility].  [name] [facility] [place] [phone]

## 2016-06-09 NOTE — Patient Instructions (Addendum)
We will have you increase the lasix (furosemide) to 2 pills daily. If this is helping call the office. If it works we will send in a stronger pill so you only have to take 1 pill daily.   We have sent in the albuterol inhaler as well so this should be at your pharmacy to pick up if needed.

## 2016-06-10 ENCOUNTER — Telehealth: Payer: Self-pay | Admitting: Internal Medicine

## 2016-06-10 DIAGNOSIS — J9611 Chronic respiratory failure with hypoxia: Secondary | ICD-10-CM

## 2016-06-10 NOTE — Telephone Encounter (Signed)
Called [facility] and spoke with Maudie Mercury:  Maudie Mercury already called and spoke with patient to inform him that they ([facility]) are unable to transfer his service because he has been with [facility] since 2013 and is uneligible to change DME companies until August 2018.  Maudie Mercury stated that patient became very upset, crying stating that he is unable to use home-fill tanks because it's too heavy.  Kim reported that she did speak with [facility] to let them know of patient's difficulty.  Dr Annamaria Boots, would it help if order is placed to [facility] specifying that patient needs a POC due upper body weakness and unable to manage the home-fill tanks?  Thank you so much.

## 2016-06-11 ENCOUNTER — Telehealth: Payer: Self-pay | Admitting: Internal Medicine

## 2016-06-11 ENCOUNTER — Encounter: Payer: Self-pay | Admitting: Internal Medicine

## 2016-06-11 MED ORDER — FUROSEMIDE 40 MG PO TABS: 40.0000 mg | ORAL_TABLET | Freq: Every day | ORAL | 1 refills | Status: DC

## 2016-06-11 NOTE — Telephone Encounter (Signed)
Can we clarify if he had been getting his O2 through the [place]?  If so they should deal with this. Otherwise we can only ask [facility] to help provide the lightest portable set-up they can.

## 2016-06-11 NOTE — Telephone Encounter (Signed)
Sent in

## 2016-06-11 NOTE — Progress Notes (Signed)
   Subjective:    Patient ID: Eric [name], male    DOB: August 29, 1926, 83 y.o.   MRN: [id number]  HPI The patient is an 83 YO man coming in for hospital follow up (in for COPD with exacerbation started with viral illness, treated with steroids and some antibiotics in the hospital, 2 admissions for the same illness within several weeks of each other). He is breathing better since leaving the hospital. Still no energy. Still taking some prednisone but no antibiotics. He denies falls since leaving the hospital. He has struggled with some dizziness for some time and that is stable. He tries to be very careful. He is still coughing some extra and using cough syrup which helps some. His bowels are about the same, no worse. Eating okay but appetite poor. No fevers or chills. Mild SOB with activity but much improved. Sleeping well.   PMH, [facility], social history reviewed and updated.   Review of Systems  Constitutional: Positive for activity change, appetite change and fatigue. Negative for chills, fever and unexpected weight change.  HENT: Negative.   Eyes: Negative.   Respiratory: Positive for cough and shortness of breath. Negative for chest tightness and wheezing.   Cardiovascular: Negative for chest pain, palpitations and leg swelling.  Gastrointestinal: Negative for abdominal distention, abdominal pain, constipation, diarrhea and nausea.  Musculoskeletal: Positive for arthralgias and gait problem. Negative for back pain, joint swelling and myalgias.  Skin: Negative.   Neurological: Positive for dizziness. Negative for weakness, light-headedness, numbness and headaches.  Psychiatric/Behavioral: Negative.       Objective:   Physical Exam  Constitutional: He is oriented to person, place, and time. He appears well-developed and well-nourished.  HENT:  Head: Normocephalic and atraumatic.  Eyes: EOM are normal.  Neck: Normal range of motion.  Cardiovascular: Normal rate and regular rhythm.     Pulmonary/Chest: Effort normal. No respiratory distress. He has no wheezes. He has no rales.  Good air movement and no wheezing  Abdominal: Soft. He exhibits no distension. There is no tenderness. There is no rebound.  Pouch intact  Musculoskeletal: He exhibits no edema.  Neurological: He is alert and oriented to person, place, and time. Coordination normal.  Skin: Skin is warm and dry.   Vitals:   06/09/16 1103 06/09/16 1145  BP: (!) 200/80 (!) 148/62  Pulse: 85   Resp: 16   Temp: 98 F (36.7 C)   TempSrc: Oral   SpO2: 95%   Weight: 227 lb 8 oz (103.2 kg)   Height: 5\' 6"  (1.676 m)       Assessment & Plan:

## 2016-06-11 NOTE — Telephone Encounter (Signed)
Patient is asking that you correct furosemide (LASIX) 20 MG tablet [id number]   script to 40mg  and prescribe to [facility] on file.

## 2016-06-11 NOTE — Telephone Encounter (Signed)
Patient contacted and stated awareness of orders sent in

## 2016-06-11 NOTE — Assessment & Plan Note (Signed)
No exacerbation today although he was given some diuretics in the hospital. Appetite is not back to normal and weight is stable from the hospital.

## 2016-06-11 NOTE — Telephone Encounter (Signed)
Please advise 

## 2016-06-11 NOTE — Assessment & Plan Note (Signed)
Stable and no falls since leaving the hospital. He is reminded again to be careful with standing and walking.

## 2016-06-11 NOTE — Addendum Note (Signed)
Addended by: Pricilla Holm A on: 06/11/2016 10:59 AM   Modules accepted: Orders

## 2016-06-11 NOTE — Assessment & Plan Note (Signed)
Still resolving with some increase in SOB but getting back to normal. Still on steroid taper at this time. Using albuterol and nebs as needed with the combivent scheduled for now. He will reduce as able.

## 2016-06-11 NOTE — Assessment & Plan Note (Signed)
This is resolved at this time and he does have chronic COPD but not chronic hypoxia.

## 2016-06-12 ENCOUNTER — Encounter: Payer: Self-pay | Admitting: Hematology and Oncology

## 2016-06-12 ENCOUNTER — Telehealth: Payer: Self-pay | Admitting: Internal Medicine

## 2016-06-12 ENCOUNTER — Ambulatory Visit: Payer: Medicare Other | Admitting: Internal Medicine

## 2016-06-12 NOTE — Telephone Encounter (Signed)
[id number] x1 

## 2016-06-12 NOTE — Progress Notes (Signed)
Pt is approved w/ [employer] to receive Sprycel free of charge from 06/11/16 - 06/07/17.

## 2016-06-12 NOTE — Telephone Encounter (Signed)
lmtcb x1 for pt. 

## 2016-06-14 DIAGNOSIS — I110 Hypertensive heart disease with heart failure: Secondary | ICD-10-CM | POA: Diagnosis not present

## 2016-06-14 DIAGNOSIS — E0965 Drug or chemical induced diabetes mellitus with hyperglycemia: Secondary | ICD-10-CM | POA: Diagnosis not present

## 2016-06-14 DIAGNOSIS — I2510 Atherosclerotic heart disease of native coronary artery without angina pectoris: Secondary | ICD-10-CM | POA: Diagnosis not present

## 2016-06-14 DIAGNOSIS — I5032 Chronic diastolic (congestive) heart failure: Secondary | ICD-10-CM | POA: Diagnosis not present

## 2016-06-14 DIAGNOSIS — C9210 Chronic myeloid leukemia, BCR/ABL-positive, not having achieved remission: Secondary | ICD-10-CM | POA: Diagnosis not present

## 2016-06-14 DIAGNOSIS — J441 Chronic obstructive pulmonary disease with (acute) exacerbation: Secondary | ICD-10-CM | POA: Diagnosis not present

## 2016-06-15 DIAGNOSIS — J441 Chronic obstructive pulmonary disease with (acute) exacerbation: Secondary | ICD-10-CM | POA: Diagnosis not present

## 2016-06-15 DIAGNOSIS — I5032 Chronic diastolic (congestive) heart failure: Secondary | ICD-10-CM | POA: Diagnosis not present

## 2016-06-15 DIAGNOSIS — I2510 Atherosclerotic heart disease of native coronary artery without angina pectoris: Secondary | ICD-10-CM | POA: Diagnosis not present

## 2016-06-15 DIAGNOSIS — E0965 Drug or chemical induced diabetes mellitus with hyperglycemia: Secondary | ICD-10-CM | POA: Diagnosis not present

## 2016-06-15 DIAGNOSIS — C9210 Chronic myeloid leukemia, BCR/ABL-positive, not having achieved remission: Secondary | ICD-10-CM | POA: Diagnosis not present

## 2016-06-15 DIAGNOSIS — I110 Hypertensive heart disease with heart failure: Secondary | ICD-10-CM | POA: Diagnosis not present

## 2016-06-15 NOTE — Telephone Encounter (Signed)
Order has been placed. Pt was made aware of this. Nothing further was needed.

## 2016-06-15 NOTE — Telephone Encounter (Signed)
Please see phone note from 1.3.18. Will sign off.

## 2016-06-15 NOTE — Telephone Encounter (Signed)
If his qualifying data is still valid, then ok to ask for expedited order for light portable O2 concentrator, 2-3L/min pulse for dx chronic respiratory failure with hypoxia.

## 2016-06-15 NOTE — Telephone Encounter (Signed)
Called and spoke to pt. Informed him he is not eligible for transfer yet. Pt verbalized understanding and states he is frustrated because he was told by [facility] that it would take 2 weeks to get a POC, although I do not see an order. Pt states he did not receive any O2 through the [place].   Dr. Annamaria Boots please advise if we can send an order to [facility] for a POC and to be expedited.

## 2016-06-16 ENCOUNTER — Other Ambulatory Visit: Payer: Self-pay | Admitting: *Deleted

## 2016-06-16 NOTE — Progress Notes (Addendum)
Addendum to documentation note by Annamary Rummage:  [employer] application case# [id number]  The office will have to authorize each refill using a refill request form supplied by [employer] A financial counselor from [place] will contact the office 2 weeks after the last shipment of Sprycel to confirm patient is still on medication and to provide refill form.  [facility]MSPA[facility] phone number for questions: [place], PharmD, [place], [place]: [phone] [facility]: [phone] 06/16/2016 10:36 AM

## 2016-06-16 NOTE — Patient Outreach (Signed)
Transition of care call. Mr. Klick denies any respiratory or cardiac issues today. He reported his weight to be 222. He has seen Dr. Ansel Bong for his post hospital visit. She ordered home health nursing and PT for him. He has his COPD Action Plan on the refrigerator and tries to follow it closely.  I will call pt again next week.  [name] [facility] [place] [phone]

## 2016-06-17 ENCOUNTER — Telehealth: Payer: Self-pay | Admitting: Internal Medicine

## 2016-06-17 DIAGNOSIS — C9210 Chronic myeloid leukemia, BCR/ABL-positive, not having achieved remission: Secondary | ICD-10-CM | POA: Diagnosis not present

## 2016-06-17 DIAGNOSIS — I110 Hypertensive heart disease with heart failure: Secondary | ICD-10-CM | POA: Diagnosis not present

## 2016-06-17 DIAGNOSIS — E0965 Drug or chemical induced diabetes mellitus with hyperglycemia: Secondary | ICD-10-CM | POA: Diagnosis not present

## 2016-06-17 DIAGNOSIS — J441 Chronic obstructive pulmonary disease with (acute) exacerbation: Secondary | ICD-10-CM | POA: Diagnosis not present

## 2016-06-17 DIAGNOSIS — I2510 Atherosclerotic heart disease of native coronary artery without angina pectoris: Secondary | ICD-10-CM | POA: Diagnosis not present

## 2016-06-17 DIAGNOSIS — I5032 Chronic diastolic (congestive) heart failure: Secondary | ICD-10-CM | POA: Diagnosis not present

## 2016-06-17 NOTE — Telephone Encounter (Signed)
Pt has questions about the POC that we sent an order for to his DME. Pt states that he tried to talk to [facility] rep and they refused to give him any information regarding what type of machine he is getting. Pt states that he does not want a concentrator that pulls air into the machine. Pt aware that I will call the DME and find out what his options are. Note   [facility] >> please supply pt with a LIGHT portable O2 concentrator >> 2-3L pluse     Angela at [facility] -- pt was evaluated with a RT on 06/15/16 Pt was evaluated on the SimplyGo Mini which recycles O2 Pt has the option of getting a home fill system and having tanks as they do not supply an other type of POC than the SimplyGo [facility] no longer does liquid gas because it is not favorable to the patient is not ideally "portable".  Pt aware. There seemed to be some miscommunication of exactly what device he would be getting from the DME.  Pt expressed understanding and is aware that the device that he was evaluated on is the device that he will get in the next 1-2 weeks when they receive shipment. Nothing further needed.

## 2016-06-18 ENCOUNTER — Telehealth: Payer: Self-pay | Admitting: Emergency Medicine

## 2016-06-18 NOTE — Telephone Encounter (Signed)
Pulmonary handles his oxygen and should call back about this.

## 2016-06-18 NOTE — Telephone Encounter (Signed)
Called Mardene Celeste back and left message stating that pulmonary should handel this

## 2016-06-18 NOTE — Telephone Encounter (Signed)
[employer] called and asked that you give her a call back about the patients oxygen concentrator. He is needing a smaller more portable one. Please advise thanks.

## 2016-06-22 ENCOUNTER — Telehealth: Payer: Self-pay | Admitting: Emergency Medicine

## 2016-06-22 DIAGNOSIS — E0965 Drug or chemical induced diabetes mellitus with hyperglycemia: Secondary | ICD-10-CM | POA: Diagnosis not present

## 2016-06-22 DIAGNOSIS — I2510 Atherosclerotic heart disease of native coronary artery without angina pectoris: Secondary | ICD-10-CM | POA: Diagnosis not present

## 2016-06-22 DIAGNOSIS — I110 Hypertensive heart disease with heart failure: Secondary | ICD-10-CM | POA: Diagnosis not present

## 2016-06-22 DIAGNOSIS — I5032 Chronic diastolic (congestive) heart failure: Secondary | ICD-10-CM | POA: Diagnosis not present

## 2016-06-22 DIAGNOSIS — C9210 Chronic myeloid leukemia, BCR/ABL-positive, not having achieved remission: Secondary | ICD-10-CM | POA: Diagnosis not present

## 2016-06-22 DIAGNOSIS — J441 Chronic obstructive pulmonary disease with (acute) exacerbation: Secondary | ICD-10-CM | POA: Diagnosis not present

## 2016-06-22 NOTE — Telephone Encounter (Signed)
[facility] called and patient isnt feeling well. He has a temp of 101.2, blood pressure is 150/58,O2 is 97%, Resp 24 and pulse 96. His weight on 1/10 was 212 and today its 214. She started feeling bad yesterday 1/14 but got a temp the end of last week. He is coughing up clear mucous. Please advise thanks.

## 2016-06-22 NOTE — Telephone Encounter (Signed)
Called betsy back left message to make an acute appointment.

## 2016-06-22 NOTE — Telephone Encounter (Signed)
Recommend acute visit!

## 2016-06-23 ENCOUNTER — Encounter: Payer: Self-pay | Admitting: Internal Medicine

## 2016-06-23 ENCOUNTER — Ambulatory Visit (INDEPENDENT_AMBULATORY_CARE_PROVIDER_SITE_OTHER): Payer: Medicare Other | Admitting: Internal Medicine

## 2016-06-23 ENCOUNTER — Other Ambulatory Visit: Payer: Self-pay | Admitting: *Deleted

## 2016-06-23 DIAGNOSIS — Z0289 Encounter for other administrative examinations: Secondary | ICD-10-CM | POA: Diagnosis not present

## 2016-06-23 DIAGNOSIS — J0110 Acute frontal sinusitis, unspecified: Secondary | ICD-10-CM | POA: Diagnosis not present

## 2016-06-23 DIAGNOSIS — I2510 Atherosclerotic heart disease of native coronary artery without angina pectoris: Secondary | ICD-10-CM

## 2016-06-23 MED ORDER — AMOXICILLIN-POT CLAVULANATE 875-125 MG PO TABS: 1.0000 | ORAL_TABLET | Freq: Two times a day (BID) | ORAL | 0 refills | Status: DC

## 2016-06-23 MED ORDER — ENSURE ENLIVE PO LIQD: 237.0000 mL | Freq: Two times a day (BID) | ORAL | 11 refills | Status: DC

## 2016-06-23 NOTE — Patient Instructions (Signed)
We have sent in a medicine to clear the sinuses. It is called augmentin. Take 1 pill twice a day for 10 days.   We have also sent in the ensure back to the pharmacy.

## 2016-06-23 NOTE — Progress Notes (Signed)
   Subjective:    Patient ID: Eric [name], male    DOB: 1926/06/16, 83 y.o.   MRN: [id number]  HPI The patient is an 83 YO man coming in for fevers. He has been treated for respiratory infection recently and was on steroids and antibiotics. His lungs have been stable and breathing is okay. The sinuses have been causing him a lot of pressure and pain. He is having yellow and bloody discharge. He has been having ear pressure and some hearing changes. Going on for 2-3 weeks now. Temp 102 yesterday checked by home health nurse and HR in low 100s the last several days at rest. No chest pains. Some stable SOB with exertion.   Review of Systems  Constitutional: Positive for activity change, chills, fatigue and fever. Negative for appetite change and unexpected weight change.  HENT: Positive for congestion, ear pain, hearing loss, postnasal drip, rhinorrhea, sinus pain, sinus pressure and sore throat. Negative for ear discharge and trouble swallowing.   Eyes: Negative.   Respiratory: Positive for cough and shortness of breath. Negative for chest tightness and wheezing.        Stable  Cardiovascular: Negative.   Gastrointestinal: Negative.   Musculoskeletal: Negative.   Skin: Negative.       Objective:   Physical Exam  Constitutional: He is oriented to person, place, and time. He appears well-developed and well-nourished.  HENT:  Head: Normocephalic and atraumatic.  TM normal bilaterally, lots of sinus pain frontally, yellow crusting nostrils, yellow and white discharge in the oropharynx.   Eyes: EOM are normal.  Neck: Normal range of motion. No JVD present.  Cardiovascular: Normal rate and regular rhythm.   Pulmonary/Chest: Effort normal. No respiratory distress. He has no wheezes.  Abdominal: Soft. He exhibits no distension. There is no tenderness. There is no rebound.  Lymphadenopathy:    He has no cervical adenopathy.  Neurological: He is alert and oriented to person, place, and time.  Coordination abnormal.  Stable, chronic  Skin: Skin is warm and dry.   Vitals:   06/23/16 1541  BP: (!) 180/70  Pulse: (!) 104  Resp: 20  Temp: 98 F (36.7 C)  TempSrc: Oral  SpO2: 93%  Weight: 214 lb (97.1 kg)  Height: 5\' 6"  (1.676 m)       Assessment & Plan:

## 2016-06-23 NOTE — Progress Notes (Signed)
Pre visit review using our clinic review tool, if applicable. No additional management support is needed unless otherwise documented below in the visit note. 

## 2016-06-23 NOTE — Patient Outreach (Signed)
Transition of care call attempted. No one answered the phone and I requested a return call.  [name] [facility] [phone]

## 2016-06-23 NOTE — Patient Outreach (Signed)
Transition of care call. Pt is fair. He still has not received his smaller O2 tanks that will help him be more mobile. He reports he still is receiving home health. He has an appt with his primary care MD this afternoon.  He reports he is taking his meds including his respiratory meds routinely.  I have advised him I will call [facility] and find out if there is anything they can do to expedite getting his new O2 delivery system.  He was kind enough to answer all my intake questions today and we finished his initial assessment.  [name] [facility] [phone]

## 2016-06-25 ENCOUNTER — Telehealth: Payer: Self-pay | Admitting: Hematology and Oncology

## 2016-06-25 ENCOUNTER — Other Ambulatory Visit: Payer: Medicare Other

## 2016-06-25 ENCOUNTER — Telehealth: Payer: Self-pay | Admitting: Medical Oncology

## 2016-06-25 NOTE — Telephone Encounter (Signed)
Patient called to cancel his appointment today for lab he also sees Dr Alvy Bimler next week.  Not sure if the Dr appointment needs to be changed as well

## 2016-06-25 NOTE — Telephone Encounter (Signed)
Pt has 30 tablets of sprycel . I told him Dr Calton Dach  nurse has his next bottle of Sprycel and to come and pick it up . He said he will get it at his lab appts next week.

## 2016-06-26 DIAGNOSIS — I2510 Atherosclerotic heart disease of native coronary artery without angina pectoris: Secondary | ICD-10-CM | POA: Diagnosis not present

## 2016-06-26 DIAGNOSIS — I5032 Chronic diastolic (congestive) heart failure: Secondary | ICD-10-CM | POA: Diagnosis not present

## 2016-06-26 DIAGNOSIS — J019 Acute sinusitis, unspecified: Secondary | ICD-10-CM | POA: Insufficient documentation

## 2016-06-26 DIAGNOSIS — J441 Chronic obstructive pulmonary disease with (acute) exacerbation: Secondary | ICD-10-CM | POA: Diagnosis not present

## 2016-06-26 DIAGNOSIS — I110 Hypertensive heart disease with heart failure: Secondary | ICD-10-CM | POA: Diagnosis not present

## 2016-06-26 DIAGNOSIS — C9210 Chronic myeloid leukemia, BCR/ABL-positive, not having achieved remission: Secondary | ICD-10-CM | POA: Diagnosis not present

## 2016-06-26 DIAGNOSIS — E0965 Drug or chemical induced diabetes mellitus with hyperglycemia: Secondary | ICD-10-CM | POA: Diagnosis not present

## 2016-06-26 NOTE — Telephone Encounter (Signed)
1/19: Bottle of Sprycel stored in locked cabinet above desk of Dr. Calton Dach collaborative nurse's desk.

## 2016-06-26 NOTE — Assessment & Plan Note (Signed)
Given the ongoing fevers and signs of sinus infection will treat with 10 days augmentin. His breathing is stable from prior and not using his inhalers more often. Given his multiple hospital stays recently if his breathing is worsening and needs treatment would need coverage for HCAP if pneumonia suspected.

## 2016-06-29 ENCOUNTER — Emergency Department (HOSPITAL_COMMUNITY): Payer: Medicare Other

## 2016-06-29 ENCOUNTER — Encounter (HOSPITAL_COMMUNITY): Payer: Self-pay | Admitting: Emergency Medicine

## 2016-06-29 ENCOUNTER — Ambulatory Visit: Payer: Medicare Other | Admitting: Emergency Medicine

## 2016-06-29 ENCOUNTER — Inpatient Hospital Stay (HOSPITAL_COMMUNITY)
Admission: EM | Admit: 2016-06-29 | Discharge: 2016-07-03 | DRG: 177 | Disposition: A | Discharge status: Skilled Nursing Facility | Payer: Medicare Other | Attending: Internal Medicine | Admitting: Internal Medicine

## 2016-06-29 DIAGNOSIS — E119 Type 2 diabetes mellitus without complications: Secondary | ICD-10-CM

## 2016-06-29 DIAGNOSIS — I248 Other forms of acute ischemic heart disease: Secondary | ICD-10-CM | POA: Diagnosis present

## 2016-06-29 DIAGNOSIS — R0602 Shortness of breath: Secondary | ICD-10-CM | POA: Diagnosis not present

## 2016-06-29 DIAGNOSIS — Z885 Allergy status to narcotic agent status: Secondary | ICD-10-CM

## 2016-06-29 DIAGNOSIS — I25700 Atherosclerosis of coronary artery bypass graft(s), unspecified, with unstable angina pectoris: Secondary | ICD-10-CM | POA: Diagnosis not present

## 2016-06-29 DIAGNOSIS — Z888 Allergy status to other drugs, medicaments and biological substances status: Secondary | ICD-10-CM

## 2016-06-29 DIAGNOSIS — E1151 Type 2 diabetes mellitus with diabetic peripheral angiopathy without gangrene: Secondary | ICD-10-CM | POA: Diagnosis present

## 2016-06-29 DIAGNOSIS — I10 Essential (primary) hypertension: Secondary | ICD-10-CM | POA: Diagnosis present

## 2016-06-29 DIAGNOSIS — I25810 Atherosclerosis of coronary artery bypass graft(s) without angina pectoris: Secondary | ICD-10-CM | POA: Diagnosis not present

## 2016-06-29 DIAGNOSIS — E739 Lactose intolerance, unspecified: Secondary | ICD-10-CM | POA: Diagnosis present

## 2016-06-29 DIAGNOSIS — E785 Hyperlipidemia, unspecified: Secondary | ICD-10-CM | POA: Diagnosis present

## 2016-06-29 DIAGNOSIS — I5032 Chronic diastolic (congestive) heart failure: Secondary | ICD-10-CM | POA: Diagnosis not present

## 2016-06-29 DIAGNOSIS — Z87891 Personal history of nicotine dependence: Secondary | ICD-10-CM

## 2016-06-29 DIAGNOSIS — Z6834 Body mass index (BMI) 34.0-34.9, adult: Secondary | ICD-10-CM

## 2016-06-29 DIAGNOSIS — J9621 Acute and chronic respiratory failure with hypoxia: Secondary | ICD-10-CM | POA: Diagnosis present

## 2016-06-29 DIAGNOSIS — R278 Other lack of coordination: Secondary | ICD-10-CM | POA: Diagnosis not present

## 2016-06-29 DIAGNOSIS — J449 Chronic obstructive pulmonary disease, unspecified: Secondary | ICD-10-CM | POA: Diagnosis present

## 2016-06-29 DIAGNOSIS — F418 Other specified anxiety disorders: Secondary | ICD-10-CM | POA: Diagnosis present

## 2016-06-29 DIAGNOSIS — R531 Weakness: Secondary | ICD-10-CM

## 2016-06-29 DIAGNOSIS — G4733 Obstructive sleep apnea (adult) (pediatric): Secondary | ICD-10-CM | POA: Diagnosis present

## 2016-06-29 DIAGNOSIS — Z950 Presence of cardiac pacemaker: Secondary | ICD-10-CM | POA: Diagnosis not present

## 2016-06-29 DIAGNOSIS — D630 Anemia in neoplastic disease: Secondary | ICD-10-CM | POA: Diagnosis present

## 2016-06-29 DIAGNOSIS — I2510 Atherosclerotic heart disease of native coronary artery without angina pectoris: Secondary | ICD-10-CM | POA: Diagnosis not present

## 2016-06-29 DIAGNOSIS — J9601 Acute respiratory failure with hypoxia: Secondary | ICD-10-CM | POA: Diagnosis not present

## 2016-06-29 DIAGNOSIS — C9210 Chronic myeloid leukemia, BCR/ABL-positive, not having achieved remission: Secondary | ICD-10-CM | POA: Diagnosis not present

## 2016-06-29 DIAGNOSIS — J189 Pneumonia, unspecified organism: Secondary | ICD-10-CM | POA: Diagnosis present

## 2016-06-29 DIAGNOSIS — Y95 Nosocomial condition: Secondary | ICD-10-CM | POA: Diagnosis present

## 2016-06-29 DIAGNOSIS — I110 Hypertensive heart disease with heart failure: Secondary | ICD-10-CM | POA: Diagnosis present

## 2016-06-29 DIAGNOSIS — K219 Gastro-esophageal reflux disease without esophagitis: Secondary | ICD-10-CM | POA: Diagnosis present

## 2016-06-29 DIAGNOSIS — J151 Pneumonia due to Pseudomonas: Secondary | ICD-10-CM | POA: Diagnosis not present

## 2016-06-29 DIAGNOSIS — Z8249 Family history of ischemic heart disease and other diseases of the circulatory system: Secondary | ICD-10-CM | POA: Diagnosis not present

## 2016-06-29 DIAGNOSIS — J440 Chronic obstructive pulmonary disease with acute lower respiratory infection: Secondary | ICD-10-CM | POA: Diagnosis present

## 2016-06-29 DIAGNOSIS — Z881 Allergy status to other antibiotic agents status: Secondary | ICD-10-CM

## 2016-06-29 DIAGNOSIS — I5033 Acute on chronic diastolic (congestive) heart failure: Secondary | ICD-10-CM | POA: Diagnosis present

## 2016-06-29 DIAGNOSIS — Z955 Presence of coronary angioplasty implant and graft: Secondary | ICD-10-CM

## 2016-06-29 DIAGNOSIS — E0965 Drug or chemical induced diabetes mellitus with hyperglycemia: Secondary | ICD-10-CM | POA: Diagnosis not present

## 2016-06-29 DIAGNOSIS — Z9981 Dependence on supplemental oxygen: Secondary | ICD-10-CM

## 2016-06-29 DIAGNOSIS — J9600 Acute respiratory failure, unspecified whether with hypoxia or hypercapnia: Secondary | ICD-10-CM | POA: Diagnosis not present

## 2016-06-29 DIAGNOSIS — J441 Chronic obstructive pulmonary disease with (acute) exacerbation: Secondary | ICD-10-CM | POA: Diagnosis not present

## 2016-06-29 DIAGNOSIS — D649 Anemia, unspecified: Secondary | ICD-10-CM | POA: Diagnosis not present

## 2016-06-29 DIAGNOSIS — R1312 Dysphagia, oropharyngeal phase: Secondary | ICD-10-CM | POA: Diagnosis not present

## 2016-06-29 DIAGNOSIS — Z8546 Personal history of malignant neoplasm of prostate: Secondary | ICD-10-CM | POA: Diagnosis not present

## 2016-06-29 DIAGNOSIS — I255 Ischemic cardiomyopathy: Secondary | ICD-10-CM | POA: Diagnosis present

## 2016-06-29 DIAGNOSIS — E871 Hypo-osmolality and hyponatremia: Secondary | ICD-10-CM | POA: Diagnosis present

## 2016-06-29 DIAGNOSIS — E669 Obesity, unspecified: Secondary | ICD-10-CM | POA: Diagnosis present

## 2016-06-29 DIAGNOSIS — R2689 Other abnormalities of gait and mobility: Secondary | ICD-10-CM | POA: Diagnosis not present

## 2016-06-29 DIAGNOSIS — Z5320 Procedure and treatment not carried out because of patient's decision for unspecified reasons: Secondary | ICD-10-CM | POA: Diagnosis not present

## 2016-06-29 LAB — CBC WITH DIFFERENTIAL/PLATELET
Basophils Absolute: 0 10*3/uL (ref 0.0–0.1)
Basophils Relative: 0 %
Eosinophils Absolute: 0 10*3/uL (ref 0.0–0.7)
Eosinophils Relative: 0 %
HCT: 23 % — ABNORMAL LOW (ref 39.0–52.0)
Hemoglobin: 7.5 g/dL — ABNORMAL LOW (ref 13.0–17.0)
Lymphocytes Relative: 10 %
Lymphs Abs: 0.5 10*3/uL — ABNORMAL LOW (ref 0.7–4.0)
MCH: 31.5 pg (ref 26.0–34.0)
MCHC: 32.6 g/dL (ref 30.0–36.0)
MCV: 96.6 fL (ref 78.0–100.0)
Monocytes Absolute: 0.7 10*3/uL (ref 0.1–1.0)
Monocytes Relative: 13 %
Neutro Abs: 3.8 10*3/uL (ref 1.7–7.7)
Neutrophils Relative %: 77 %
Platelets: 250 10*3/uL (ref 150–400)
RBC: 2.38 MIL/uL — ABNORMAL LOW (ref 4.22–5.81)
RDW: 16.3 % — ABNORMAL HIGH (ref 11.5–15.5)
WBC: 5 10*3/uL (ref 4.0–10.5)

## 2016-06-29 LAB — BASIC METABOLIC PANEL
Anion gap: 12 (ref 5–15)
BUN: 20 mg/dL (ref 6–20)
CO2: 21 mmol/L — ABNORMAL LOW (ref 22–32)
Calcium: 8.9 mg/dL (ref 8.9–10.3)
Chloride: 98 mmol/L — ABNORMAL LOW (ref 101–111)
Creatinine, Ser: 1.02 mg/dL (ref 0.61–1.24)
GFR calc Af Amer: >60 mL/min (ref >60)
GFR calc non Af Amer: >60 mL/min (ref >60)
Glucose, Bld: 135 mg/dL — ABNORMAL HIGH (ref 65–99)
Potassium: 3.9 mmol/L (ref 3.5–5.1)
Sodium: 131 mmol/L — ABNORMAL LOW (ref 135–145)

## 2016-06-29 LAB — CBC
HEMATOCRIT: 22.4 % — AB (ref 39.0–52.0)
HEMOGLOBIN: 7.3 g/dL — AB (ref 13.0–17.0)
MCH: 31.5 pg (ref 26.0–34.0)
MCHC: 32.6 g/dL (ref 30.0–36.0)
MCV: 96.6 fL (ref 78.0–100.0)
Platelets: 266 10*3/uL (ref 150–400)
RBC: 2.32 MIL/uL — AB (ref 4.22–5.81)
RDW: 16.2 % — ABNORMAL HIGH (ref 11.5–15.5)
WBC: 5.2 10*3/uL (ref 4.0–10.5)

## 2016-06-29 LAB — TROPONIN I: Troponin I: 0.04 ng/mL (ref <0.03)

## 2016-06-29 LAB — CREATININE, SERUM
CREATININE: 1.08 mg/dL (ref 0.61–1.24)
GFR calc Af Amer: >60 mL/min (ref >60)
GFR calc non Af Amer: 60 mL/min — ABNORMAL LOW (ref >60)

## 2016-06-29 LAB — EXPECTORATED SPUTUM ASSESSMENT W REFEX TO RESP CULTURE

## 2016-06-29 LAB — BRAIN NATRIURETIC PEPTIDE: B Natriuretic Peptide: 223 pg/mL — ABNORMAL HIGH (ref 0.0–100.0)

## 2016-06-29 LAB — EXPECTORATED SPUTUM ASSESSMENT W GRAM STAIN, RFLX TO RESP C

## 2016-06-29 LAB — INFLUENZA PANEL BY PCR (TYPE A & B)
INFLAPCR: NEGATIVE
INFLBPCR: NEGATIVE

## 2016-06-29 MED ORDER — INSULIN ASPART 100 UNIT/ML ~~LOC~~ SOLN
0.0000 [IU] | Freq: Three times a day (TID) | SUBCUTANEOUS | Status: DC
  Administered 2016-06-30: 7 [IU] via SUBCUTANEOUS
  Administered 2016-06-30: 3 [IU] via SUBCUTANEOUS

## 2016-06-29 MED ORDER — METHYLPREDNISOLONE SODIUM SUCC 125 MG IJ SOLR
125.0000 mg | Freq: Once | INTRAMUSCULAR | Status: AC
  Administered 2016-06-29: 125 mg via INTRAMUSCULAR
  Filled 2016-06-29: qty 2

## 2016-06-29 MED ORDER — FUROSEMIDE 10 MG/ML IJ SOLN
40.0000 mg | Freq: Two times a day (BID) | INTRAMUSCULAR | Status: DC
  Administered 2016-06-30 – 2016-07-01 (×3): 40 mg via INTRAVENOUS
  Filled 2016-06-29 (×3): qty 4

## 2016-06-29 MED ORDER — SODIUM CHLORIDE 0.9 % IV SOLN
30.0000 meq | Freq: Once | INTRAVENOUS | Status: AC
  Administered 2016-06-29: 30 meq via INTRAVENOUS
  Filled 2016-06-29: qty 15

## 2016-06-29 MED ORDER — POLYETHYL GLYCOL-PROPYL GLYCOL 0.4-0.3 % OP SOLN: 2.0000 [drp] | Freq: Three times a day (TID) | OPHTHALMIC | Status: DC | PRN

## 2016-06-29 MED ORDER — POLYVINYL ALCOHOL 1.4 % OP SOLN
2.0000 [drp] | OPHTHALMIC | Status: DC | PRN
  Administered 2016-06-30: 2 [drp] via OPHTHALMIC
  Filled 2016-06-29: qty 15

## 2016-06-29 MED ORDER — FUROSEMIDE 10 MG/ML IJ SOLN
40.0000 mg | Freq: Once | INTRAMUSCULAR | Status: AC
  Administered 2016-06-29: 40 mg via INTRAVENOUS
  Filled 2016-06-29: qty 4

## 2016-06-29 MED ORDER — DEXTROSE 5 % IV SOLN
1.0000 g | Freq: Three times a day (TID) | INTRAVENOUS | Status: DC
  Administered 2016-06-30 – 2016-07-02 (×8): 1 g via INTRAVENOUS
  Filled 2016-06-29 (×9): qty 1

## 2016-06-29 MED ORDER — HYDROCODONE-ACETAMINOPHEN 7.5-325 MG PO TABS
1.0000 | ORAL_TABLET | Freq: Four times a day (QID) | ORAL | Status: DC | PRN
  Administered 2016-06-29 – 2016-06-30 (×2): 1 via ORAL
  Filled 2016-06-29 (×2): qty 1

## 2016-06-29 MED ORDER — DEXTROSE 5 % IV SOLN
1.0000 g | Freq: Once | INTRAVENOUS | Status: AC
  Administered 2016-06-29: 1 g via INTRAVENOUS
  Filled 2016-06-29: qty 1

## 2016-06-29 MED ORDER — ACETAMINOPHEN 325 MG PO TABS
650.0000 mg | ORAL_TABLET | ORAL | Status: DC | PRN
  Administered 2016-07-01: 650 mg via ORAL
  Filled 2016-06-29: qty 2

## 2016-06-29 MED ORDER — DASATINIB 70 MG PO TABS: 70.0000 mg | ORAL_TABLET | Freq: Every day | ORAL | Status: DC

## 2016-06-29 MED ORDER — ALBUTEROL SULFATE (2.5 MG/3ML) 0.083% IN NEBU
2.5000 mg | INHALATION_SOLUTION | RESPIRATORY_TRACT | Status: DC | PRN
  Administered 2016-06-30 (×2): 2.5 mg via RESPIRATORY_TRACT
  Filled 2016-06-29 (×3): qty 3

## 2016-06-29 MED ORDER — ASPIRIN EC 81 MG PO TBEC
81.0000 mg | DELAYED_RELEASE_TABLET | Freq: Every evening | ORAL | Status: DC
Start: 2016-06-30 — End: 2016-07-03
  Administered 2016-06-30 – 2016-07-02 (×3): 81 mg via ORAL
  Filled 2016-06-29 (×3): qty 1

## 2016-06-29 MED ORDER — HEPARIN SODIUM (PORCINE) 5000 UNIT/ML IJ SOLN
5000.0000 [IU] | Freq: Three times a day (TID) | INTRAMUSCULAR | Status: DC
  Administered 2016-06-30 – 2016-07-03 (×11): 5000 [IU] via SUBCUTANEOUS
  Filled 2016-06-29 (×11): qty 1

## 2016-06-29 MED ORDER — SODIUM CHLORIDE 0.9% FLUSH: 3.0000 mL | INTRAVENOUS | Status: DC | PRN

## 2016-06-29 MED ORDER — VANCOMYCIN HCL IN DEXTROSE 1-5 GM/200ML-% IV SOLN
1000.0000 mg | INTRAVENOUS | Status: AC
  Administered 2016-06-29: 1000 mg via INTRAVENOUS
  Filled 2016-06-29: qty 200

## 2016-06-29 MED ORDER — IPRATROPIUM-ALBUTEROL 0.5-2.5 (3) MG/3ML IN SOLN
3.0000 mL | Freq: Four times a day (QID) | RESPIRATORY_TRACT | Status: DC
  Administered 2016-06-30 – 2016-07-01 (×5): 3 mL via RESPIRATORY_TRACT
  Filled 2016-06-29 (×5): qty 3

## 2016-06-29 MED ORDER — HEPARIN SODIUM (PORCINE) 5000 UNIT/ML IJ SOLN: 5000.0000 [IU] | Freq: Three times a day (TID) | INTRAMUSCULAR | Status: DC

## 2016-06-29 MED ORDER — ENSURE ENLIVE PO LIQD
237.0000 mL | Freq: Two times a day (BID) | ORAL | Status: DC
  Administered 2016-06-30 – 2016-07-03 (×6): 237 mL via ORAL

## 2016-06-29 MED ORDER — ONDANSETRON HCL 4 MG/2ML IJ SOLN: 4.0000 mg | Freq: Four times a day (QID) | INTRAMUSCULAR | Status: DC | PRN

## 2016-06-29 MED ORDER — PANTOPRAZOLE SODIUM 40 MG PO TBEC
40.0000 mg | DELAYED_RELEASE_TABLET | Freq: Every day | ORAL | Status: DC
Start: 2016-06-30 — End: 2016-07-03
  Administered 2016-06-30 – 2016-07-03 (×4): 40 mg via ORAL
  Filled 2016-06-29 (×4): qty 1

## 2016-06-29 MED ORDER — SODIUM CHLORIDE 0.9 % IV SOLN: 250.0000 mL | INTRAVENOUS | Status: DC | PRN

## 2016-06-29 MED ORDER — PRAVASTATIN SODIUM 40 MG PO TABS
60.0000 mg | ORAL_TABLET | Freq: Every day | ORAL | Status: DC
  Administered 2016-06-29 – 2016-07-02 (×4): 60 mg via ORAL
  Filled 2016-06-29 (×6): qty 1

## 2016-06-29 MED ORDER — IPRATROPIUM-ALBUTEROL 0.5-2.5 (3) MG/3ML IN SOLN
6.0000 mL | Freq: Once | RESPIRATORY_TRACT | Status: DC
Start: 2016-06-29 — End: 2016-06-29

## 2016-06-29 MED ORDER — SODIUM CHLORIDE 0.9% FLUSH
3.0000 mL | Freq: Two times a day (BID) | INTRAVENOUS | Status: DC
  Administered 2016-06-29 – 2016-06-30 (×2): 3 mL via INTRAVENOUS

## 2016-06-29 MED ORDER — IPRATROPIUM-ALBUTEROL 0.5-2.5 (3) MG/3ML IN SOLN
3.0000 mL | Freq: Once | RESPIRATORY_TRACT | Status: AC
  Administered 2016-06-29: 3 mL via RESPIRATORY_TRACT
  Filled 2016-06-29: qty 3

## 2016-06-29 MED ORDER — ACETAMINOPHEN 500 MG PO TABS: 1000.0000 mg | ORAL_TABLET | Freq: Four times a day (QID) | ORAL | Status: DC | PRN

## 2016-06-29 MED ORDER — VANCOMYCIN HCL IN DEXTROSE 750-5 MG/150ML-% IV SOLN
750.0000 mg | Freq: Two times a day (BID) | INTRAVENOUS | Status: DC
  Administered 2016-06-30 – 2016-07-01 (×3): 750 mg via INTRAVENOUS
  Filled 2016-06-29 (×4): qty 150

## 2016-06-29 NOTE — ED Notes (Signed)
Bed: [id number] Expected date:  Expected time:  Means of arrival:  Comments: EMS- 83yo M, SOB/Hx of CHF

## 2016-06-29 NOTE — ED Notes (Signed)
Report called to [facility] on 5E

## 2016-06-29 NOTE — Progress Notes (Signed)
Pharmacy Antibiotic Note  Eric [name] is a 83 y.o. male admitted on 06/29/2016 with pneumonia.  Pharmacy has been consulted for vancomycin dosing. Last hospitalized on December for AECOPD.    Plan:  Vancomycin 1gm x 1 then 750mg  IV q12h  Check trough if remains on vancomycin > 72h  Suggest continue cefepime at 1gm IV q8h   Monitor renal function and culture results    Temp (24hrs), Avg:98.4 F (36.9 C), Min:98.4 F (36.9 C), Max:98.4 F (36.9 C)   Recent Labs Lab 06/29/16 1435  WBC 5.0  CREATININE 1.02    Estimated Creatinine Clearance: 55.6 mL/min (by C-G formula based on SCr of 1.02 mg/dL).    Allergies  Allergen Reactions  . Actos [Pioglitazone Hydrochloride] Other (See Comments)    "felt funny, drowsy, and weak":  [name] Buprenorphine Hcl Nausea And Vomiting  . Celebrex [Celecoxib] Other (See Comments)    "felt funny"  . Demerol Palpitations and Other (See Comments)    Increased BP  . Meperidine Palpitations    Other reaction(s): Other (See Comments) Increased BP  . Morphine And Related Nausea And Vomiting  . Ciprofloxacin Other (See Comments)    arthralgia  . Metformin Nausea And Vomiting  . Zocor [Simvastatin] Other (See Comments)    Makes pt very drowsy    Antimicrobials this admission: 1/22 >> vancomycin 1/22 >> zosyn   Dose adjustments this admission:  Microbiology results:   Thank you for allowing pharmacy to be a part of this patient's care.  Doreene Eland, PharmD, BCPS.   Pager: [phone] 06/29/2016 5:25 PM

## 2016-06-29 NOTE — ED Provider Notes (Signed)
[facility] DEPT Provider Note   CSN: [id number] Arrival date & time: 06/29/16  1202   History   Chief Complaint Chief Complaint  Patient presents with  . Shortness of Breath    HPI Eric [name] is a 83 y.o. male.  HPI   83 year old male with a significant past medical history of CAD, chronic diastolic heart failure, COPD on 2 L nasal cannula admitted most recently on 1226 and discharged on 06/06/2016 with COPD exacerbation. Patient presenting today with shortness of breath. He notes since hospital discharge she has been short of breath that has been worsening. He reports at baseline he is comfortable on his 2 L at home, but with any attempted ambulation he comes severely dyspneic with associated wheezing. Patient notes that he was recently seen by primary care and started on Augmentin for presumed sinusitis which she notes is improving. Patient notes a dry nonproductive cough, denies any associated chest pain, fever. Patient has been using an inhaler at home along with nebulizers that did not provide significant relief of the symptoms. Patient notes he occasionally has swelling in his lower extremities that is resolved in the morning upon wakening.  Patient was brought via EMS, he reports using 2 albuterol treatments prior to arrival, EMS administered DuoNeb which patient notes significantly improved his symptoms.  Patient notes he lives at home, has 4 daughters that live in the area. Patient reports he was attempting to go see his pulmonologist today, but was too short of breath to ambulate.   Past Medical History:  Diagnosis Date  . ALLERGIC RHINITIS   . ANEMIA-NOS   . AORTIC SCLEROSIS   . Asthma   . CARDIOMYOPATHY, ISCHEMIC   . CAROTID BRUIT, RIGHT 02/27/2008  . Cataract    surgery  . CML (chronic myeloid leukemia) ([place]) 06/26/2015  . COPD   . CORONARY ARTERY DISEASE    CABG 1995, PTCA/DES 2008, 2009 and 08/2010  . DIABETES MELLITUS-TYPE II    diet controlled  .  Diastolic dysfunction, Grade 1 11/24/2014  . Diverticulitis of colon with perforation 11/22/2014  . Diverticulosis   . GERD   . HIATAL HERNIA   . Hx of echocardiogram    Echo (9/15):  Mild LVH, EF 50-55%, no RWMA, Gr 1 DD, MAC, mild LAE.  [name] HYPERLIPIDEMIA   . HYPERTENSION   . Hyponatremia 11/22/2014  . IBS (irritable bowel syndrome)   . LACTOSE INTOLERANCE   . OA (osteoarthritis)   . OBESITY   . Partial small bowel obstruction   . PERIPHERAL VASCULAR DISEASE   . Primary hyperparathyroidism ([place])    Lab Results Component Value Date  PTH 150.7* 02/13/2013  CALCIUM 11.0* 02/13/2013  CAION 1.21 03/15/2008    . Prostate cancer ([place])    seed implants 2004  . SICK SINUS/ TACHY-BRADY SYNDROME 09/2007   s/p PPM st judes  . Sleep apnea   . SMALL BOWEL OBSTRUCTION 04/18/2009   Qualifier: History of  By: Asa Lente MD, Jannifer Rodney Symptomatic diverticulosis 01/18/2009   Qualifier: Diagnosis of  By: Shane Crutch, Amy S     Patient Active Problem List   Diagnosis Date Noted  . Pneumonia 06/29/2016  . Acute sinusitis 06/26/2016  . Acute respiratory failure with hypoxia ([place]) 05/22/2016  . COPD exacerbation ([place]) 05/19/2016  . Diarrhea due to drug 01/28/2016  . Anemia in neoplastic disease 12/12/2015  . Acquired pancytopenia ([place]) 10/28/2015  . Tachy-brady syndrome ([place]) 07/10/2015  . Bilateral carotid artery stenosis 07/10/2015  .  Essential hypertension 07/10/2015  . CML (chronic myeloid leukemia) ([place]) 06/26/2015  . Myeloproliferative neoplasm ([place]) 06/25/2015  . Dizziness 05/27/2015  . Back pain 12/10/2014  . Obesity (BMI 30-39.9) 12/08/2014  . Hartmann's pouch of intestine ([place]) 12/08/2014  . HCAP (healthcare-associated pneumonia) 12/07/2014  . CAD (coronary artery disease) of artery bypass graft 11/28/2014  . Chronic diastolic heart failure ([place]) 11/24/2014  . Hyponatremia 11/22/2014  . GERD (gastroesophageal reflux disease) 03/27/2014  . Generalized weakness 02/08/2014  . Pacemaker  04/10/2013  . Obstructive sleep apnea 04/11/2011  . Type 2 diabetes mellitus, controlled ([place]) 01/18/2009  . Dyslipidemia 01/18/2009  . Hypertensive heart disease with CHF ([place]) 01/18/2009  . Coronary atherosclerosis 01/18/2009  . GERD 01/18/2009  . COPD mixed type ([place]) 12/27/2006    Past Surgical History:  Procedure Laterality Date  . BACK SURGERY    . Bilateral cataracts    . COLON RESECTION N/A 11/28/2014   Procedure: EXPLORATORY LAPAROTOMY, SIGMOID COLECTOMY WITH COLOSTOMY;  Surgeon: Jackolyn Confer, MD;  Location: [facility] ORS;  Service: General;  Laterality: N/A;  . COLON SURGERY    . COLONOSCOPY    . CORONARY ARTERY BYPASS GRAFT    . ESOPHAGOGASTRODUODENOSCOPY  multiple  . FLEXIBLE SIGMOIDOSCOPY N/A 09/22/2013   Procedure: FLEXIBLE SIGMOIDOSCOPY;  Surgeon: Gatha Mayer, MD;  Location: [facility] ENDOSCOPY;  Service: Endoscopy;  Laterality: N/A;  . INGUINAL HERNIA REPAIR Bilateral   . LUMBAR [place] SURGERY  12/2008  . PACEMAKER INSERTION     DDD/[employer]         Last interrogation 2/13  on chart     Pacemaker guideline order Dr Tamala Julian on chart  . Partial small bowel obstruction  2009  . PENILE PROSTHESIS PLACEMENT    . PTCA  2008, 2009, 2012   with DES  . TOTAL HIP ARTHROPLASTY  08/21/2011   Procedure: TOTAL HIP ARTHROPLASTY;  Surgeon: Johnn Hai, MD;  Location: [facility] ORS;  Service: Orthopedics;  Laterality: Right;       Home Medications    Prior to Admission medications   Medication Sig Start Date End Date Taking? Authorizing Provider  acetaminophen (TYLENOL) 500 MG tablet Take 1,000 mg by mouth every 6 (six) hours as needed for mild pain or fever.   Yes Historical Provider, MD  albuterol (PROVENTIL HFA) 108 (90 Base) MCG/ACT inhaler Inhale 2 puffs into the lungs every 4 (four) hours as needed for shortness of breath. Wheezing 06/09/16  Yes [name], MD  albuterol (PROVENTIL) (2.5 MG/3ML) 0.083% nebulizer solution USE 1 VIAL WITH NEBULIZER EVERY 4 HOURS AS NEEDED  FOR WHEEZING 11/25/15  Yes Deneise Lever, MD  albuterol-ipratropium (COMBIVENT) 18-103 MCG/ACT inhaler Inhale 1 puff into the lungs every 6 (six) hours as needed for wheezing or shortness of breath.   Yes Historical Provider, MD  aspirin 81 MG tablet Take 81 mg by mouth every evening.    Yes Historical Provider, MD  dasatinib (SPRYCEL) 70 MG tablet Take 1 tablet (70 mg total) by mouth daily. 05/21/16  Yes Heath Lark, MD  feeding supplement, ENSURE ENLIVE, (ENSURE ENLIVE) LIQD Take 237 mLs by mouth 2 (two) times daily between meals. 06/23/16  Yes [name], MD  fluticasone (FLONASE) 50 MCG/ACT nasal spray Place 2 sprays into both nostrils daily. Allergies Patient taking differently: Place 2 sprays into both nostrils 2 (two) times daily as needed for allergies. Allergies 08/01/14  Yes [name], MD  furosemide (LASIX) 40 MG tablet Take 1 tablet (40 mg  total) by mouth daily. 06/11/16  Yes [name], MD  guaiFENesin-dextromethorphan (ROBITUSSIN DM) 100-10 MG/5ML syrup Take 5 mLs by mouth every 4 (four) hours as needed for cough. 06/06/16  Yes Annita Brod, MD  HYDROcodone-acetaminophen (NORCO) 7.5-325 MG tablet Take 1 tablet by mouth every 6 (six) hours as needed for moderate pain.   Yes Historical Provider, MD  nitroGLYCERIN (NITROSTAT) 0.4 MG SL tablet Place 0.4 mg under the tongue every 5 (five) minutes as needed for chest pain. Reported on 12/12/2015   Yes Historical Provider, MD  Polyethyl Glycol-Propyl Glycol (SYSTANE) 0.4-0.3 % SOLN Apply 2 drops to eye 3 (three) times daily as needed (dry eyes).    Yes Historical Provider, MD  pravastatin (PRAVACHOL) 20 MG tablet Take 3 tablets (60 mg total) by mouth daily. 08/01/14  Yes [name], MD  ranitidine (ZANTAC) 300 MG tablet Take 300 mg by mouth at bedtime.    Yes Historical Provider, MD  triamcinolone cream (KENALOG) 0.1 % Apply 1 application topically 3 (three) times daily as needed (dry skin.).  07/19/15  Yes  Historical Provider, MD  valsartan (DIOVAN) 160 MG tablet Take 1 tablet (160 mg total) by mouth daily. Patient taking differently: Take 160 mg by mouth 2 (two) times daily.  08/01/14  Yes [name], MD    Family History Family History  Problem Relation Age of Onset  . Hypertension Mother   . Cancer Mother   . Heart attack    . Heart attack    . Heart attack Brother   . Colon cancer Neg Hx   . Stroke Neg Hx     Social History Social History  Substance Use Topics  . Smoking status: Former Smoker    Packs/day: 1.00    Years: 25.00    Types: Cigarettes    Quit date: 06/08/1994  . Smokeless tobacco: Never Used  . Alcohol use No     Allergies   Actos [pioglitazone hydrochloride]; Buprenorphine hcl; Celebrex [celecoxib]; Demerol; Meperidine; Morphine and related; Ciprofloxacin; Metformin; and Zocor [simvastatin]   Review of Systems Review of Systems  All other systems reviewed and are negative.    Physical Exam Updated Vital Signs BP 164/82 (BP Location: Right Arm)   Pulse 102   Temp 98.3 F (36.8 C) (Oral)   Resp 26   SpO2 100%   Physical Exam  Constitutional: He is oriented to person, place, and time. He appears well-developed and well-nourished.  Patient tearful  HENT:  Head: Normocephalic and atraumatic.  Eyes: Conjunctivae are normal. Pupils are equal, round, and reactive to light. Right eye exhibits no discharge. Left eye exhibits no discharge. No scleral icterus.  Neck: Normal range of motion. No JVD present. No tracheal deviation present.  Pulmonary/Chest: Effort normal. No stridor.  Minor wheeze- faint crackles bilateral   Neurological: He is alert and oriented to person, place, and time. Coordination normal.  Skin:  Small amount of LEE  Psychiatric: He has a normal mood and affect. His behavior is normal. Judgment and thought content normal.  Nursing note and vitals reviewed.   ED Treatments / Results  Labs (all labs ordered are listed,  but only abnormal results are displayed) Labs Reviewed  CBC WITH DIFFERENTIAL/PLATELET - Abnormal; Notable for the following:       Result Value   RBC 2.38 (*)    Hemoglobin 7.5 (*)    HCT 23.0 (*)    RDW 16.3 (*)    Lymphs Abs 0.5 (*)  All other components within normal limits  BASIC METABOLIC PANEL - Abnormal; Notable for the following:    Sodium 131 (*)    Chloride 98 (*)    CO2 21 (*)    Glucose, Bld 135 (*)    All other components within normal limits  BRAIN NATRIURETIC PEPTIDE - Abnormal; Notable for the following:    B Natriuretic Peptide 223.0 (*)    All other components within normal limits    EKG  EKG Interpretation  Date/Time:  Monday June 29 2016 12:03:53 EST Ventricular Rate:  95 PR Interval:    QRS Duration: 92 QT Interval:  383 QTC Calculation: 482 R Axis:   -13 Text Interpretation:  Sinus arrhythmia Multiple premature complexes, vent & supraven Low voltage, precordial leads Minimal ST elevation, inferior leads Borderline prolonged QT interval No significant change since last tracing Confirmed by Winfred Leeds  MD, SAM [phone]) on 06/29/2016 12:27:15 PM       Radiology Dg Chest 2 View  Result Date: 06/29/2016 CLINICAL DATA:  Shortness of Breath.  History of prostate carcinoma EXAM: CHEST  2 VIEW COMPARISON:  Chest radiograph June 02, 2016 and chest CT December 2017 2017. FINDINGS: There is cardiomegaly with pulmonary venous hypertension. Pacemaker leads are attached to the right atrium and right ventricle. Patient is status post coronary artery bypass grafting. There are pleural effusions bilaterally. There is patchy interstitial and alveolar edema in both mid and lower lung zones. There is aortic atherosclerosis. There is degenerative change in the thoracic spine. No adenopathy. No blastic or lytic bone lesions. IMPRESSION: Findings consistent with a degree of congestive heart failure. Superimposed bibasilar pneumonia cannot be excluded radiographically.  Electronically Signed   By: Lowella Grip III M.D.   On: 06/29/2016 15:13    Procedures Procedures (including critical care time)  Medications Ordered in ED Medications  vancomycin (VANCOCIN) IVPB 1000 mg/200 mL premix (1,000 mg Intravenous New Bag/Given 06/29/16 2110)  vancomycin (VANCOCIN) IVPB 750 mg/150 ml premix (not administered)  ceFEPIme (MAXIPIME) 1 g in dextrose 5 % 50 mL IVPB (0 g Intravenous Stopped 06/29/16 2021)  furosemide (LASIX) injection 40 mg (40 mg Intravenous Given 06/29/16 1738)  ipratropium-albuterol (DUONEB) 0.5-2.5 (3) MG/3ML nebulizer solution 3 mL (3 mLs Nebulization Given 06/29/16 2110)     Initial Impression / Assessment and Plan / ED Course  I have reviewed the triage vital signs and the nursing notes.  Pertinent labs & imaging results that were available during my care of the patient were reviewed by me and considered in my medical decision making (see chart for details).     Final Clinical Impressions(s) / ED Diagnoses   Final diagnoses:  Healthcare-associated pneumonia    Labs:  BNP, CBC, BMP  Imaging: DG chest   Consults: [facility]   Therapeutics:  Discharge Meds:   Assessment/Plan:  83 year old male presents today with likely CHF with superimposed pneumonia. Patient will need hospital admission for further evaluation. He is in full arrest, but even small changes in positioning cause decreased oxygenation. Patient down into the 80s with sitting on the edge of the bed. Patient is afebrile presently and has no signs of respiratory distress. [facility] hospitalist consult at and agreed for admission.    New Prescriptions New Prescriptions   No medications on file     Okey Regal, PA-C 06/29/16 2134    Julianne Rice, MD 07/02/16 [phone]

## 2016-06-29 NOTE — Progress Notes (Signed)
Pharmacy - Physician Communication - Hold Criteria for oral chemotherapy  Dasatinib (Sprycel) hold criteria  Hgb < 8  ANC < 0.5  Pltc < 50K  Hemorrhage  Severe fluid overload  Pulmonary symptoms  Active infection  Hgb 7.5 SOB Being treating with antibiotics for pneumonia  Plan: - Meets several hold criteria, hold dastinib at this time - re-evaluate ability resume at discharge or sooner if infection is rule out  Doreene Eland, PharmD, BCPS.   Pager: [phone] 06/29/2016 10:00 PM

## 2016-06-29 NOTE — ED Triage Notes (Addendum)
Per EMS pt is from an independent living facility with complaints of SOB that has increased this morning. Hx of CHF and COPD. Pt wears 3L oxygen at home at all times. Pt uses walker at home. Pt had an appointment with his pulmonologist today but was unable to go because of no transportation. EMS 5 mg albuterol and 0.5 mg atrovent. EMS reports pt has rales, rhonchi, and wheezing.

## 2016-06-29 NOTE — Clinical Social Work Note (Signed)
Clinical Social Work Assessment  Patient Details  Name: Eric [name] MRN: [id number] Date of Birth: 11-25-1926  Date of referral:  06/29/16               Reason for consult:  Discharge Planning, Facility Placement                Permission sought to share information with:  Facility Art therapist granted to share information::  Yes, Verbal Permission Granted  Name::        Agency::     Relationship::     Contact Information:     Housing/Transportation Living arrangements for the past 2 months:  [place] ([employer]) Source of Information:  Patient Patient Interpreter Needed:  None Criminal Activity/Legal Involvement Pertinent to Current Situation/Hospitalization:  No - Comment as needed Significant Relationships:  Adult Children Lives with:  Facility Resident Do you feel safe going back to the place where you live?  Yes (Pt does desire some form of Home Health / Assistance with ADL's ) Need for family participation in patient care:  No (Coment)  Care giving concerns:  Pt requestd care at home after discharge.   Social Worker assessment / plan: CSW and pt spoke about about pt's request for some type of assistance or help with activities of daily living in his independent home.  Pt stated he did not want to transition to an ALF or SNF at this time, but did want to return to the [facility] at discharge.  CSW informed pt CSW would document pt's request for assistance at home.  Employment status:  Retired Forensic scientist:  Information systems manager, Glass blower/designer Care) PT Recommendations:  Not assessed at this time Information / Referral to community resources:     Patient/Family's Response to care:  Pt appreciated CSW. Patient/Family's Understanding of and Emotional Response to Diagnosis, Current Treatment, and Prognosis:  Pt indicated he understood by re-affirming his wishes for home assistance with  CSW.  Emotional Assessment Appearance:  Appears stated age Attitude/Demeanor/Rapport:    Affect (typically observed):  Adaptable, Apprehensive, Calm Orientation:  Oriented to Self, Oriented to Place, Oriented to  Time, Oriented to Situation Alcohol / Substance use:  Not Applicable Psych involvement (Current and /or in the community):  No (Comment)  Discharge Needs  Concerns to be addressed:  Care Coordination.   Readmission within the last 30 days:  No Current discharge risk:  None Barriers to Discharge:  No Barriers Identified   Claudine Mouton, LCSWA 06/29/2016, 7:03 PM

## 2016-06-29 NOTE — H&P (Signed)
History and Physical  Eric [name] [id number] DOB: 21-Mar-1927 DOA: 06/29/2016  Referring physician: Penni Bombard PCP: [name], MD   Chief Complaint: SOB  HPI: Eric [name] is a 83 y.o. male Per EMS pt is from an independent living facility with complaints of SOB that has increased this morning. Hx of CHF and COPD. Pt wears 3L oxygen at home at all times. Pt uses walker at home. Pt had an appointment with his pulmonologist today but was unable to go because of no transportation. EMS 5 mg albuterol and 0.5 mg atrovent. EMS reports pt has rales, rhonchi, and wheezing. Pt was reporting that he was having significant shortness of breath with ambulation. His pulse ox drops in the low 80s with ambulation. He reports productive cough. He denies fever and chills. He reports that he has been without his Lasix for some time. He does report that his wheezing and coughing has progressively worsened over the past 2 weeks since being discharged from the hospital. He reports occasional chest pain associated with deep coughing.  ED course: Patient was evaluated and noted to be hypoxic and chest x-ray reveals congestive heart failure with superimposed pneumonia. The patient will be admitted for further evaluation and management.   Review of Systems: All systems reviewed and apart from history of presenting illness, are negative.  Past Medical History:  Diagnosis Date  . ALLERGIC RHINITIS   . ANEMIA-NOS   . AORTIC SCLEROSIS   . Asthma   . CARDIOMYOPATHY, ISCHEMIC   . CAROTID BRUIT, RIGHT 02/27/2008  . Cataract    surgery  . CML (chronic myeloid leukemia) ([place]) 06/26/2015  . COPD   . CORONARY ARTERY DISEASE    CABG 1995, PTCA/DES 2008, 2009 and 08/2010  . DIABETES MELLITUS-TYPE II    diet controlled  . Diastolic dysfunction, Grade 1 11/24/2014  . Diverticulitis of colon with perforation 11/22/2014  . Diverticulosis   . GERD   . HIATAL HERNIA   . Hx of echocardiogram    Echo (9/15):   Mild LVH, EF 50-55%, no RWMA, Gr 1 DD, MAC, mild LAE.  [name] HYPERLIPIDEMIA   . HYPERTENSION   . Hyponatremia 11/22/2014  . IBS (irritable bowel syndrome)   . LACTOSE INTOLERANCE   . OA (osteoarthritis)   . OBESITY   . Partial small bowel obstruction   . PERIPHERAL VASCULAR DISEASE   . Primary hyperparathyroidism ([place])    Lab Results Component Value Date  PTH 150.7* 02/13/2013  CALCIUM 11.0* 02/13/2013  CAION 1.21 03/15/2008    . Prostate cancer ([place])    seed implants 2004  . SICK SINUS/ TACHY-BRADY SYNDROME 09/2007   s/p PPM st judes  . Sleep apnea   . SMALL BOWEL OBSTRUCTION 04/18/2009   Qualifier: History of  By: Asa Lente MD, Jannifer Rodney Symptomatic diverticulosis 01/18/2009   Qualifier: Diagnosis of  By: Shane Crutch, Amy S    Past Surgical History:  Procedure Laterality Date  . BACK SURGERY    . Bilateral cataracts    . COLON RESECTION N/A 11/28/2014   Procedure: EXPLORATORY LAPAROTOMY, SIGMOID COLECTOMY WITH COLOSTOMY;  Surgeon: Jackolyn Confer, MD;  Location: [facility];  Service: General;  Laterality: N/A;  . COLON SURGERY    . COLONOSCOPY    . CORONARY ARTERY BYPASS GRAFT    . ESOPHAGOGASTRODUODENOSCOPY  multiple  . FLEXIBLE SIGMOIDOSCOPY N/A 09/22/2013   Procedure: FLEXIBLE SIGMOIDOSCOPY;  Surgeon: Gatha Mayer, MD;  Location: [facility];  Service: Endoscopy;  Laterality: N/A;  . INGUINAL HERNIA REPAIR Bilateral   . LUMBAR [place] SURGERY  12/2008  . PACEMAKER INSERTION     DDD/[employer]         Last interrogation 2/13  on chart     Pacemaker guideline order Dr Tamala Julian on chart  . Partial small bowel obstruction  2009  . PENILE PROSTHESIS PLACEMENT    . PTCA  2008, 2009, 2012   with DES  . TOTAL HIP ARTHROPLASTY  08/21/2011   Procedure: TOTAL HIP ARTHROPLASTY;  Surgeon: Johnn Hai, MD;  Location: [facility];  Service: Orthopedics;  Laterality: Right;   Social History:  reports that he quit smoking about 22 years ago. His smoking use included Cigarettes. He has a 25.00  pack-year smoking history. He has never used smokeless tobacco. He reports that he does not drink alcohol or use drugs.   Allergies  Allergen Reactions  . Actos [Pioglitazone Hydrochloride] Other (See Comments)    "felt funny, drowsy, and weak":  [name] Buprenorphine Hcl Nausea And Vomiting  . Celebrex [Celecoxib] Other (See Comments)    "felt funny"  . Demerol Palpitations and Other (See Comments)    Increased BP  . Meperidine Palpitations    Other reaction(s): Other (See Comments) Increased BP  . Morphine And Related Nausea And Vomiting  . Ciprofloxacin Other (See Comments)    arthralgia  . Metformin Nausea And Vomiting  . Zocor [Simvastatin] Other (See Comments)    Makes pt very drowsy    Family History  Problem Relation Age of Onset  . Hypertension Mother   . Cancer Mother   . Heart attack    . Heart attack    . Heart attack Brother   . Colon cancer Neg Hx   . Stroke Neg Hx     Prior to Admission medications   Medication Sig Start Date End Date Taking? Authorizing Provider  acetaminophen (TYLENOL) 500 MG tablet Take 1,000 mg by mouth every 6 (six) hours as needed for mild pain or fever.   Yes Historical Provider, MD  albuterol (PROVENTIL HFA) 108 (90 Base) MCG/ACT inhaler Inhale 2 puffs into the lungs every 4 (four) hours as needed for shortness of breath. Wheezing 06/09/16  Yes [name], MD  albuterol (PROVENTIL) (2.5 MG/3ML) 0.083% nebulizer solution USE 1 VIAL WITH NEBULIZER EVERY 4 HOURS AS NEEDED FOR WHEEZING 11/25/15  Yes Deneise Lever, MD  albuterol-ipratropium (COMBIVENT) 18-103 MCG/ACT inhaler Inhale 1 puff into the lungs every 6 (six) hours as needed for wheezing or shortness of breath.   Yes Historical Provider, MD  aspirin 81 MG tablet Take 81 mg by mouth every evening.    Yes Historical Provider, MD  dasatinib (SPRYCEL) 70 MG tablet Take 1 tablet (70 mg total) by mouth daily. 05/21/16  Yes Heath Lark, MD  feeding supplement, ENSURE ENLIVE, (ENSURE  ENLIVE) LIQD Take 237 mLs by mouth 2 (two) times daily between meals. 06/23/16  Yes [name], MD  fluticasone (FLONASE) 50 MCG/ACT nasal spray Place 2 sprays into both nostrils daily. Allergies Patient taking differently: Place 2 sprays into both nostrils 2 (two) times daily as needed for allergies. Allergies 08/01/14  Yes [name], MD  furosemide (LASIX) 40 MG tablet Take 1 tablet (40 mg total) by mouth daily. 06/11/16  Yes [name], MD  guaiFENesin-dextromethorphan (ROBITUSSIN DM) 100-10 MG/5ML syrup Take 5 mLs by mouth every 4 (four) hours as needed for cough. 06/06/16  Yes Annita Brod,  MD  HYDROcodone-acetaminophen (NORCO) 7.5-325 MG tablet Take 1 tablet by mouth every 6 (six) hours as needed for moderate pain.   Yes Historical Provider, MD  nitroGLYCERIN (NITROSTAT) 0.4 MG SL tablet Place 0.4 mg under the tongue every 5 (five) minutes as needed for chest pain. Reported on 12/12/2015   Yes Historical Provider, MD  Polyethyl Glycol-Propyl Glycol (SYSTANE) 0.4-0.3 % SOLN Apply 2 drops to eye 3 (three) times daily as needed (dry eyes).    Yes Historical Provider, MD  pravastatin (PRAVACHOL) 20 MG tablet Take 3 tablets (60 mg total) by mouth daily. 08/01/14  Yes [name], MD  ranitidine (ZANTAC) 300 MG tablet Take 300 mg by mouth at bedtime.    Yes Historical Provider, MD  triamcinolone cream (KENALOG) 0.1 % Apply 1 application topically 3 (three) times daily as needed (dry skin.).  07/19/15  Yes Historical Provider, MD  valsartan (DIOVAN) 160 MG tablet Take 1 tablet (160 mg total) by mouth daily. Patient taking differently: Take 160 mg by mouth 2 (two) times daily.  08/01/14  Yes [name], MD   Physical Exam: Vitals:   06/29/16 1202 06/29/16 1425 06/29/16 1634  BP: (!) 151/48 143/76 161/64  Pulse: 94 95 92  Resp: (!) _0 Temp: 98.4 F (36.9 C)    SpO2: 100% 93% 99%     General exam: chronically ill appearing male, lying  comfortably supine on the gurney in no obvious distress.  Head, eyes and ENT: Nontraumatic and normocephalic. Pupils equally reacting to light and accommodation. Oral mucosa dry.  Neck: Supple. No JVD, carotid bruit or thyromegaly.  Lymphatics: No lymphadenopathy.  Respiratory system: Diffuse bilateral expiratory wheezes heard with Rales heard in the left upper lobe.  Cardiovascular system: S1 and S2 heard. Bilateral pretibial edema.  Gastrointestinal system: colostomy full, Abdomen is nondistended, soft and nontender. Normal bowel sounds heard. No organomegaly or masses appreciated.  Central nervous system: Alert and oriented. No focal neurological deficits.  Extremities: 1+ edema bilateral LEs. Peripheral pulses symmetrically felt.   Skin: No acute findings.  Psychiatry: Pleasant and cooperative.  Flat depressed affect.   Labs on Admission:  Basic Metabolic Panel:  Recent Labs Lab 06/29/16 1435  NA 131*  K 3.9  CL 98*  CO2 21*  GLUCOSE 135*  BUN 20  CREATININE 1.02  CALCIUM 8.9   Liver Function Tests: No results for input(s): AST, ALT, ALKPHOS, BILITOT, PROT, ALBUMIN in the last 168 hours. No results for input(s): LIPASE, AMYLASE in the last 168 hours. No results for input(s): AMMONIA in the last 168 hours. CBC:  Recent Labs Lab 06/29/16 1435  WBC 5.0  NEUTROABS 3.8  HGB 7.5*  HCT 23.0*  MCV 96.6  PLT 250   Cardiac Enzymes: No results for input(s): CKTOTAL, CKMB, CKMBINDEX, TROPONINI in the last 168 hours.  BNP (last 3 results)  Recent Labs  11/27/15 1621  PROBNP 296.0*   CBG: No results for input(s): GLUCAP in the last 168 hours.  Radiological Exams on Admission: Dg Chest 2 View  Result Date: 06/29/2016 CLINICAL DATA:  Shortness of Breath.  History of prostate carcinoma EXAM: CHEST  2 VIEW COMPARISON:  Chest radiograph June 02, 2016 and chest CT December 2017 2017. FINDINGS: There is cardiomegaly with pulmonary venous hypertension. Pacemaker  leads are attached to the right atrium and right ventricle. Patient is status post coronary artery bypass grafting. There are pleural effusions bilaterally. There is patchy interstitial and alveolar edema in both mid and  lower lung zones. There is aortic atherosclerosis. There is degenerative change in the thoracic spine. No adenopathy. No blastic or lytic bone lesions. IMPRESSION: Findings consistent with a degree of congestive heart failure. Superimposed bibasilar pneumonia cannot be excluded radiographically. Electronically Signed   By: Lowella Grip III M.D.   On: 06/29/2016 15:13   EKG: Independently reviewed.  Assessment/Plan Active Problems:   Type 2 diabetes mellitus, controlled (HCC)   Hypertensive heart disease with CHF (HCC)   Coronary atherosclerosis   COPD mixed type (HCC)   Obstructive sleep apnea   Generalized weakness   GERD (gastroesophageal reflux disease)   Hyponatremia   Chronic diastolic heart failure (HCC)   CAD (coronary artery disease) of artery bypass graft   HCAP (healthcare-associated pneumonia)   CML (chronic myeloid leukemia) ([place])   Essential hypertension   Anemia in neoplastic disease   Acute respiratory failure with hypoxia ([place])  1. Acute on chronic respiratory failure with hypoxia-secondary to pneumonia and COPD.  Treating as a nosocomial pneumonia as patient was recently charged from the hospital 2 weeks ago. IV cefepime and vancomycin ordered. Scheduled nebulizers ordered. IV Solu-Medrol given. Supplemental oxygen will be continued. Patient is on chronic supplemental oxygen at 3 L/m which likely will need to be increased. 2. Acute exacerbation of diastolic congestive heart failure-apparent on chest x-ray, IV Lasix ordered 40 mg IV every 12 hours for diuresis, monitor intake and output strictly, monitor daily weights, monitor on telemetry. Monitor electrolytes closely. Cardiology consultation requested. 3. Generalized weakness-acutely worsening over the  past 2 weeks likely secondary to respiratory failure. Treating as above. PT evaluation requested. 4. HCAP-treating as above.   5. Hyponatremia - suspect from volume overload, diuresing as above and repeating BMP in AM.   6. OSA - continue nightly CPAP therapy.   7. GERD - protonix ordered.    DVT Prophylaxis: heparin Code Status: full  Disposition Plan: TBD   Time spent: 15 mins  Irwin Brakeman, MD [facility] Pager [phone]  If 7PM-7AM, please contact night-coverage [email or web address] Password [facility] 06/29/2016, 5:33 PM

## 2016-06-29 NOTE — Progress Notes (Signed)
Patient refuses cpap for tonight 

## 2016-06-30 ENCOUNTER — Other Ambulatory Visit (HOSPITAL_COMMUNITY): Payer: Medicare Other

## 2016-06-30 ENCOUNTER — Ambulatory Visit: Payer: Self-pay | Admitting: *Deleted

## 2016-06-30 ENCOUNTER — Encounter (HOSPITAL_COMMUNITY): Payer: Self-pay | Admitting: Student

## 2016-06-30 DIAGNOSIS — I110 Hypertensive heart disease with heart failure: Secondary | ICD-10-CM

## 2016-06-30 DIAGNOSIS — J9601 Acute respiratory failure with hypoxia: Secondary | ICD-10-CM

## 2016-06-30 DIAGNOSIS — J189 Pneumonia, unspecified organism: Secondary | ICD-10-CM

## 2016-06-30 DIAGNOSIS — E871 Hypo-osmolality and hyponatremia: Secondary | ICD-10-CM

## 2016-06-30 DIAGNOSIS — I25700 Atherosclerosis of coronary artery bypass graft(s), unspecified, with unstable angina pectoris: Secondary | ICD-10-CM

## 2016-06-30 DIAGNOSIS — J449 Chronic obstructive pulmonary disease, unspecified: Secondary | ICD-10-CM

## 2016-06-30 DIAGNOSIS — G4733 Obstructive sleep apnea (adult) (pediatric): Secondary | ICD-10-CM

## 2016-06-30 LAB — BASIC METABOLIC PANEL
Anion gap: 10 (ref 5–15)
BUN: 19 mg/dL (ref 6–20)
CHLORIDE: 100 mmol/L — AB (ref 101–111)
CO2: 21 mmol/L — AB (ref 22–32)
CREATININE: 1.05 mg/dL (ref 0.61–1.24)
Calcium: 8.9 mg/dL (ref 8.9–10.3)
GFR calc Af Amer: >60 mL/min (ref >60)
GFR calc non Af Amer: >60 mL/min (ref >60)
GLUCOSE: 215 mg/dL — AB (ref 65–99)
Potassium: 4.8 mmol/L (ref 3.5–5.1)
Sodium: 131 mmol/L — ABNORMAL LOW (ref 135–145)

## 2016-06-30 LAB — CBC WITH DIFFERENTIAL/PLATELET
Basophils Absolute: 0 10*3/uL (ref 0.0–0.1)
Basophils Relative: 0 %
Eosinophils Absolute: 0 10*3/uL (ref 0.0–0.7)
Eosinophils Relative: 0 %
HEMATOCRIT: 22.5 % — AB (ref 39.0–52.0)
HEMOGLOBIN: 7.2 g/dL — AB (ref 13.0–17.0)
LYMPHS ABS: 0.1 10*3/uL — AB (ref 0.7–4.0)
LYMPHS PCT: 3 %
MCH: 30.1 pg (ref 26.0–34.0)
MCHC: 32 g/dL (ref 30.0–36.0)
MCV: 94.1 fL (ref 78.0–100.0)
MONO ABS: 0.1 10*3/uL (ref 0.1–1.0)
Monocytes Relative: 1 %
NEUTROS PCT: 96 %
Neutro Abs: 3.3 10*3/uL (ref 1.7–7.7)
Platelets: 265 10*3/uL (ref 150–400)
RBC: 2.39 MIL/uL — ABNORMAL LOW (ref 4.22–5.81)
RDW: 15.9 % — AB (ref 11.5–15.5)
WBC: 3.5 10*3/uL — ABNORMAL LOW (ref 4.0–10.5)

## 2016-06-30 LAB — GLUCOSE, CAPILLARY
GLUCOSE-CAPILLARY: 184 mg/dL — AB (ref 65–99)
GLUCOSE-CAPILLARY: 148 mg/dL — AB (ref 65–99)
Glucose-Capillary: 235 mg/dL — ABNORMAL HIGH (ref 65–99)
Glucose-Capillary: 303 mg/dL — ABNORMAL HIGH (ref 65–99)
Glucose-Capillary: 205 mg/dL — ABNORMAL HIGH (ref 65–99)

## 2016-06-30 LAB — TROPONIN I
TROPONIN I: 0.03 ng/mL — AB (ref <0.03)
TROPONIN I: 0.03 ng/mL — AB (ref <0.03)
Troponin I: 0.03 ng/mL (ref <0.03)

## 2016-06-30 LAB — MRSA PCR SCREENING: MRSA by PCR: NEGATIVE

## 2016-06-30 LAB — MAGNESIUM: Magnesium: 2.5 mg/dL — ABNORMAL HIGH (ref 1.7–2.4)

## 2016-06-30 LAB — HIV ANTIBODY (ROUTINE TESTING W REFLEX): HIV Screen 4th Generation wRfx: NONREACTIVE

## 2016-06-30 LAB — STREP PNEUMONIAE URINARY ANTIGEN: Strep Pneumo Urinary Antigen: NEGATIVE

## 2016-06-30 MED ORDER — INSULIN ASPART 100 UNIT/ML ~~LOC~~ SOLN
0.0000 [IU] | Freq: Three times a day (TID) | SUBCUTANEOUS | Status: DC
  Administered 2016-06-30: 5 [IU] via SUBCUTANEOUS
  Administered 2016-07-01 – 2016-07-02 (×3): 3 [IU] via SUBCUTANEOUS
  Administered 2016-07-02 – 2016-07-03 (×3): 2 [IU] via SUBCUTANEOUS
  Administered 2016-07-03: 3 [IU] via SUBCUTANEOUS

## 2016-06-30 MED ORDER — INSULIN ASPART 100 UNIT/ML ~~LOC~~ SOLN
4.0000 [IU] | Freq: Three times a day (TID) | SUBCUTANEOUS | Status: DC
  Administered 2016-06-30 – 2016-07-03 (×8): 4 [IU] via SUBCUTANEOUS

## 2016-06-30 MED ORDER — IRBESARTAN 150 MG PO TABS
150.0000 mg | ORAL_TABLET | Freq: Two times a day (BID) | ORAL | Status: DC
  Administered 2016-06-30 – 2016-07-03 (×7): 150 mg via ORAL
  Filled 2016-06-30 (×7): qty 1

## 2016-06-30 MED ORDER — INSULIN ASPART 100 UNIT/ML ~~LOC~~ SOLN: 0.0000 [IU] | Freq: Every day | SUBCUTANEOUS | Status: DC

## 2016-06-30 MED ORDER — GUAIFENESIN-DM 100-10 MG/5ML PO SYRP
5.0000 mL | ORAL_SOLUTION | ORAL | Status: DC | PRN
  Administered 2016-06-30 – 2016-07-02 (×3): 5 mL via ORAL
  Filled 2016-06-30 (×5): qty 10

## 2016-06-30 NOTE — Progress Notes (Signed)
PROGRESS NOTE   Eric [name]  [id number]  DOB: Mar 29, 1927  DOA: 06/29/2016 PCP: [name], MD   Hospital course: Eric [name] is a 83 y.o. male Per EMS pt is from an independent living facility with complaints of SOB that has increased this morning. Hx of CHF and COPD. Pt wears 3L oxygen at home at all times. Pt uses walker at home. Pt had an appointment with his pulmonologist today but was unable to go because of no transportation. EMS 5 mg albuterol and 0.5 mg atrovent. EMS reports pt has rales, rhonchi, and wheezing. Pt was reporting that he was having significant shortness of breath with ambulation. His pulse ox drops in the low 80s with ambulation. He reports productive cough. He denies fever and chills. He reports that he has been without his Lasix for some time. He does report that his wheezing and coughing has progressively worsened over the past 2 weeks since being discharged from the hospital. He reports occasional chest pain associated with deep coughing.  Assessment & Plan:   1. Acute on chronic respiratory failure with hypoxia-secondary to pneumonia and COPD.  Treating as a nosocomial pneumonia as patient was recently charged from the hospital 2 weeks ago. IV cefepime and vancomycin ordered. Scheduled nebulizers ordered. IV Solu-Medrol given. Supplemental oxygen will be continued. Patient is on chronic supplemental oxygen at 3 L/m which likely will need to be increased. 2. Acute exacerbation of diastolic congestive heart failure-apparent on chest x-ray, IV Lasix ordered 40 mg IV every 12 hours for diuresis, monitor intake and output strictly, monitor daily weights, monitor on telemetry. Monitor electrolytes closely. Cardiology consultation requested. 3. Generalized weakness-acutely worsening over the past 2 weeks likely secondary to respiratory failure. Treating as above. PT evaluation requested. 4. HCAP-treating as above.   5. Hyponatremia - suspect from volume  overload, diuresing as above and repeating BMP in AM.   6. OSA - continue nightly CPAP therapy.  Pt refused CPAP last night.  7. GERD - protonix ordered.    DVT Prophylaxis: heparin Code Status: full  Disposition Plan: TBD   Subjective: Pt says he feels better today but not breathing at his baseline and still severely SOB with ambulation. Pt refusing CPAP last night.  Objective: Vitals:   06/29/16 2149 06/30/16 0159 06/30/16 0401 06/30/16 0827  BP: (!) 157/59  (!) 161/65   Pulse: (!) 105  82   Resp: (!) 24  (!) 22   Temp: 100 F (37.8 C)  98 F (36.7 C)   TempSrc: Oral  Oral   SpO2: 97% 95% 94% 96%    Intake/Output Summary (Last 24 hours) at 06/30/16 1344 Last data filed at 06/30/16 1000  Gross per 24 hour  Intake             1070 ml  Output             1000 ml  Net               70 ml   There were no vitals filed for this visit.  Exam:  General exam: chronically ill appearing male, lying comfortably supine on the gurney in no obvious distress. Respiratory system: much less wheezing than prior exam, better air movement. No increased work of breathing. Cardiovascular system: S1 & S2 heard.  Gastrointestinal system: Abdomen is nondistended, soft and nontender. Normal bowel sounds heard. Central nervous system: Alert and oriented. No focal neurological deficits. Extremities: TED hoses BLE with less edema noted.  Data Reviewed: Basic Metabolic Panel:  Recent Labs Lab 06/29/16 1435 06/29/16 2237 06/30/16 0552  NA 131*  --  131*  K 3.9  --  4.8  CL 98*  --  100*  CO2 21*  --  21*  GLUCOSE 135*  --  215*  BUN 20  --  19  CREATININE 1.02 1.08 1.05  CALCIUM 8.9  --  8.9  MG  --   --  2.5*   Liver Function Tests: No results for input(s): AST, ALT, ALKPHOS, BILITOT, PROT, ALBUMIN in the last 168 hours. No results for input(s): LIPASE, AMYLASE in the last 168 hours. No results for input(s): AMMONIA in the last 168 hours. CBC:  Recent Labs Lab 06/29/16 1435  06/29/16 2237 06/30/16 0552  WBC 5.0 5.2 3.5*  NEUTROABS 3.8  --  3.3  HGB 7.5* 7.3* 7.2*  HCT 23.0* 22.4* 22.5*  MCV 96.6 96.6 94.1  PLT 250 266 265   Cardiac Enzymes:  Recent Labs Lab 06/29/16 2237 06/30/16 0552 06/30/16 1011  TROPONINI 0.04* 0.03* 0.03*   CBG (last 3)   Recent Labs  06/29/16 2153 06/30/16 0738 06/30/16 1156  GLUCAP 184* 235* 303*   Recent Results (from the past 240 hour(s))  Culture, sputum-assessment     Status: None   Collection Time: 06/29/16  9:40 PM  Result Value Ref Range Status   Specimen Description SPUTUM  Final   Special Requests NONE  Final   Sputum evaluation THIS SPECIMEN IS ACCEPTABLE FOR SPUTUM CULTURE  Final   Report Status 06/29/2016 FINAL  Final  Culture, respiratory (NON-Expectorated)     Status: None (Preliminary result)   Collection Time: 06/29/16  9:40 PM  Result Value Ref Range Status   Specimen Description SPUTUM  Final   Special Requests NONE Reflexed from [id number]  Final   Gram Stain   Final    RARE WBC PRESENT, PREDOMINANTLY PMN MODERATE GRAM NEGATIVE RODS RARE GRAM POSITIVE COCCI IN PAIRS Performed at [facility], [place] [address], [place], [place] [address]    Culture PENDING  Incomplete   Report Status PENDING  Incomplete     Studies: Dg Chest 2 View  Result Date: 06/29/2016 CLINICAL DATA:  Shortness of Breath.  History of prostate carcinoma EXAM: CHEST  2 VIEW COMPARISON:  Chest radiograph June 02, 2016 and chest CT December 2017 2017. FINDINGS: There is cardiomegaly with pulmonary venous hypertension. Pacemaker leads are attached to the right atrium and right ventricle. Patient is status post coronary artery bypass grafting. There are pleural effusions bilaterally. There is patchy interstitial and alveolar edema in both mid and lower lung zones. There is aortic atherosclerosis. There is degenerative change in the thoracic spine. No adenopathy. No blastic or lytic bone lesions. IMPRESSION: Findings  consistent with a degree of congestive heart failure. Superimposed bibasilar pneumonia cannot be excluded radiographically. Electronically Signed   By: Lowella Grip III M.D.   On: 06/29/2016 15:13   Scheduled Meds: . aspirin EC  81 mg Oral QPM  . ceFEPime (MAXIPIME) IV  1 g Intravenous Q8H  . feeding supplement (ENSURE ENLIVE)  237 mL Oral BID BM  . furosemide  40 mg Intravenous Q12H  . heparin  5,000 Units Subcutaneous Q8H  . insulin aspart  0-9 Units Subcutaneous TID WC  . ipratropium-albuterol  3 mL Nebulization Q6H  . irbesartan  150 mg Oral BID  . pantoprazole  40 mg Oral Daily  . pravastatin  60 mg Oral QHS  .  sodium chloride flush  3 mL Intravenous Q12H  . vancomycin  750 mg Intravenous Q12H   Continuous Infusions:  Active Problems:   Type 2 diabetes mellitus, controlled (HCC)   Hypertensive heart disease with CHF (HCC)   Coronary atherosclerosis   COPD mixed type (HCC)   Obstructive sleep apnea   Generalized weakness   GERD (gastroesophageal reflux disease)   Hyponatremia   Chronic diastolic heart failure (HCC)   CAD (coronary artery disease) of artery bypass graft   HCAP (healthcare-associated pneumonia)   CML (chronic myeloid leukemia) ([place])   Essential hypertension   Anemia in neoplastic disease   Acute respiratory failure with hypoxia (HCC)   Pneumonia  Time spent:   Irwin Brakeman, MD, FAAFP [facility] Pager [phone]  If 7PM-7AM, please contact night-coverage [email or web address] Password [email or web address] 06/30/2016, 1:44 PM    LOS: 1 day

## 2016-06-30 NOTE — Consult Note (Signed)
Cardiology Consult    Patient ID: Eric [name] MRN: [id number], DOB/AGE: [id number]   Admit date: 06/29/2016 Date of Consult: 06/30/2016  Primary Physician: [name], MD Reason for Consult: CHF Primary Cardiologist: Dr. Tamala Julian Primary Electrophysiologist: Dr. Caryl Comes Requesting Provider: Dr. Wynetta Emery   History of Present Illness    Eric [name] is a 83 y.o. male with past medical history of CAD (s/p CABG in 1995, DES in 2008, 2009, and most recent in 2012 with DES to SVG-OM), chronic diastolic CHF (EF 40-98% by echo in 05/2016), COPD (on 3L [place] at home), tachybrady syndrome (s/p PPM placement in 2009), HTN, HLD, and CML who presented to [facility] ED on 06/29/2016 for evaluation of worsening dyspnea.   Was recently admitted from 12/26 - 06/06/2016 for acute on chronic respiratory failure secondary to acute COPD exacerbation. Weight at time of hospital discharge was 220 lbs. Was continued on Lasix 46m daily at that time.   Upon arriving to the ER, he reported worsening dyspnea since his prior hospitalization. Breathing at baseline when at rest but develops dyspnea with minimal exertion. Had recently been seen by his PCP and started on Augmentin for sinusitis and Lasix was increased to 474mdaily.  Initial labs showed a WBC of 5.0, Hgb 7.5, and platelets 250. Na+ 131, K+ 3.9, and creatinine 1.02. BNP 223. Troponin values flat at 0.04 and 0.03. EKG shows NSR, HR 95, with PAC's, and slight ST depression in the lateral leads. CXR showed degree of congestive heart failure with superimposed bibasilar pneumonia not being excluded.   He was admitted for treatment of HCAP and started on IV antibiotics. Has also been started on IV Lasix 4021mID for CHF exacerbation. No weights recorded and output minimal at -200 cc today, however he reports frequent urination.  Breathing has slightly improved since admission but not at baseline. Reports having an episode of chest discomfort two  night ago which occurred when lying down to sleep and spontaneously resolved. No chest discomfort with exertion. Reports having lower extremity edema prior to admission which has now resolved with weights being stable at 220-222 lbs. Has baseline orthopnea, frequently sleeping in the recliner.    Past Medical History   Past Medical History:  Diagnosis Date  . ALLERGIC RHINITIS   . ANEMIA-NOS   . AORTIC SCLEROSIS   . Asthma   . CARDIOMYOPATHY, ISCHEMIC   . CAROTID BRUIT, RIGHT 02/27/2008  . Cataract    surgery  . C[place] ([place]) [place]18[place]  . COPD   . CORONARY ARTERY DISEASE    a. s/p CABG in 1995 b. DES in 2008, 2009, and most recent in 2012 with DES to SVG-OM  . DIABETES MELLITUS-TYPE II    diet controlled  . Diastolic dysfunction, Grade 1 11/24/2014  . Diverticulitis of colon with perforation 11/22/2014  . Diverticulosis   . GERD   . HIATAL HERNIA   . Hx of echocardiogram    Echo (9/15):  Mild LVH, EF 50-55%, no RWMA, Gr 1 DD, MAC, mild LAE.  . HMarland KitchenPERLIPIDEMIA   . HYPERTENSION   . Hyponatremia 11/22/2014  . IBS (irritable bowel syndrome)   . LACTOSE INTOLERANCE   . OA (osteoarthritis)   . OBESITY   . Partial small bowel obstruction   . PERIPHERAL VASCULAR DISEASE   . Primary hyperparathyroidism ([facility]  Lab Results Component Value Date  PTH 150.7* 02/13/2013  CALCIUM 11.0* 02/13/2013  CAION 1.21 03/15/2008    .  Prostate cancer ([place])    seed implants 2004  . SICK SINUS/ TACHY-BRADY SYNDROME 09/2007   s/p PPM st judes  . Sleep apnea   . SMALL BOWEL OBSTRUCTION 04/18/2009   Qualifier: History of  By: Asa Lente MD, Jannifer Rodney Symptomatic diverticulosis 01/18/2009   Qualifier: Diagnosis of  By: Shane Crutch, Amy S     Past Surgical History:  Procedure Laterality Date  . BACK SURGERY    . Bilateral cataracts    . COLON RESECTION N/A 11/28/2014   Procedure: EXPLORATORY LAPAROTOMY, SIGMOID COLECTOMY WITH COLOSTOMY;  Surgeon: Jackolyn Confer, MD;  Location:  [facility] ORS;  Service: General;  Laterality: N/A;  . COLON SURGERY    . COLONOSCOPY    . CORONARY ARTERY BYPASS GRAFT    . ESOPHAGOGASTRODUODENOSCOPY  multiple  . FLEXIBLE SIGMOIDOSCOPY N/A 09/22/2013   Procedure: FLEXIBLE SIGMOIDOSCOPY;  Surgeon: Gatha Mayer, MD;  Location: [facility];  Service: Endoscopy;  Laterality: N/A;  . INGUINAL HERNIA REPAIR Bilateral   . LUMBAR [place] SURGERY  12/2008  . PACEMAKER INSERTION     DDD/[employer]         Last interrogation 2/13  on chart     Pacemaker guideline order Dr Tamala Julian on chart  . Partial small bowel obstruction  2009  . PENILE PROSTHESIS PLACEMENT    . PTCA  2008, 2009, 2012   with DES  . TOTAL HIP ARTHROPLASTY  08/21/2011   Procedure: TOTAL HIP ARTHROPLASTY;  Surgeon: Johnn Hai, MD;  Location: [facility];  Service: Orthopedics;  Laterality: Right;     Allergies  Allergies  Allergen Reactions  . Actos [Pioglitazone Hydrochloride] Other (See Comments)    "felt funny, drowsy, and weak":  [name] Buprenorphine Hcl Nausea And Vomiting  . Celebrex [Celecoxib] Other (See Comments)    "felt funny"  . Demerol Palpitations and Other (See Comments)    Increased BP  . Meperidine Palpitations    Other reaction(s): Other (See Comments) Increased BP  . Morphine And Related Nausea And Vomiting  . Ciprofloxacin Other (See Comments)    arthralgia  . Metformin Nausea And Vomiting  . Zocor [Simvastatin] Other (See Comments)    Makes pt very drowsy    Inpatient Medications    . aspirin EC  81 mg Oral QPM  . ceFEPime (MAXIPIME) IV  1 g Intravenous Q8H  . feeding supplement (ENSURE ENLIVE)  237 mL Oral BID BM  . furosemide  40 mg Intravenous Q12H  . heparin  5,000 Units Subcutaneous Q8H  . insulin aspart  0-9 Units Subcutaneous TID WC  . ipratropium-albuterol  3 mL Nebulization Q6H  . pantoprazole  40 mg Oral Daily  . pravastatin  60 mg Oral QHS  . sodium chloride flush  3 mL Intravenous Q12H  . vancomycin  750 mg Intravenous Q12H     Family History    Family History  Problem Relation Age of Onset  . Hypertension Mother   . Cancer Mother   . Heart attack    . Heart attack    . Heart attack Brother   . Colon cancer Neg Hx   . Stroke Neg Hx     Social History    Social History   Social History  . Marital status: Widowed    Spouse name: N/A  . Number of children: N/A  . Years of education: N/A   Occupational History  . retired Retired   Social History Main Topics  . Smoking status:  Former Smoker    Packs/day: 1.00    Years: 25.00    Types: Cigarettes    Quit date: 06/08/1994  . Smokeless tobacco: Never Used  . Alcohol use No  . Drug use: No  . 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 Not on file   Social History Narrative  . No narrative on file     Review of Systems    General:  No chills, fever, night sweats or weight changes.  Cardiovascular:  No chest pain, palpitations, paroxysmal nocturnal dyspnea. Positive for edema, orthopnea, and dyspnea with exertion.  Dermatological: No rash, lesions/masses Respiratory: No cough, Positive for dyspnea Urologic: No hematuria, dysuria Abdominal:   No nausea, vomiting, diarrhea, bright red blood per rectum, melena, or hematemesis Neurologic:  No visual changes, wkns, changes in mental status. All other systems reviewed and are otherwise negative except as noted above.  Physical Exam    Blood pressure (!) 161/65, pulse 82, temperature 98 F (36.7 C), temperature source Oral, resp. rate (!) 22, SpO2 96 %.  General: Pleasant, elderly Caucasian male appearing in NAD Psych: Normal affect. Neuro: Alert and oriented X 3. Moves all extremities spontaneously. HEENT: Normal  Neck: Supple without bruits. JVD at 9cm. Lungs:  Resp regular and unlabored, decreased breath sounds at bases bilaterally. Heart: RRR no s3, s4, or murmurs. Abdomen: Soft, non-tender,  non-distended, BS + x 4.  Extremities: No clubbing or cyanosis. Trace lower extremity edema. DP/PT/Radials 2+ and equal bilaterally.  Labs    Troponin (Point of Care Test) No results for input(s): TROPIPOC in the last 72 hours.  Recent Labs  06/29/16 2237 06/30/16 0552 06/30/16 1011  TROPONINI 0.04* 0.03* 0.03*   Lab Results  Component Value Date   WBC 3.5 (L) 06/30/2016   HGB 7.2 (L) 06/30/2016   HCT 22.5 (L) 06/30/2016   MCV 94.1 06/30/2016   PLT 265 06/30/2016     Recent Labs Lab 06/30/16 0552  NA 131*  K 4.8  CL 100*  CO2 21*  BUN 19  CREATININE 1.05  CALCIUM 8.9  GLUCOSE 215*   Lab Results  Component Value Date   CHOL 108 05/27/2015   HDL 22.70 (L) 05/27/2015   LDLCALC 55 05/27/2015   TRIG 151.0 (H) 05/27/2015   No results found for: [facility]   Radiology Studies    Dg Chest 2 View  Result Date: 06/29/2016 CLINICAL DATA:  Shortness of Breath.  History of prostate carcinoma EXAM: CHEST  2 VIEW COMPARISON:  Chest radiograph June 02, 2016 and chest CT December 2017 2017. FINDINGS: There is cardiomegaly with pulmonary venous hypertension. Pacemaker leads are attached to the right atrium and right ventricle. Patient is status post coronary artery bypass grafting. There are pleural effusions bilaterally. There is patchy interstitial and alveolar edema in both mid and lower lung zones. There is aortic atherosclerosis. There is degenerative change in the thoracic spine. No adenopathy. No blastic or lytic bone lesions. IMPRESSION: Findings consistent with a degree of congestive heart failure. Superimposed bibasilar pneumonia cannot be excluded radiographically. Electronically Signed   By: Lowella Grip III M.D.   On: 06/29/2016 15:13   Dg Chest 2 View  Result Date: 06/02/2016 CLINICAL DATA:  Worsening cough, congestion shortness of breath over the past few days. EXAM: CHEST  2 VIEW COMPARISON:  PA and lateral chest 05/19/2016, 12/01/2015 and 05/27/2015.  FINDINGS: The lungs are  emphysematous. Bibasilar airspace disease is identified. No pneumothorax. Small left pleural effusion noted. Heart size is upper normal. The patient is status post CABG with a pacing device in place. IMPRESSION: Bibasilar airspace disease could be due to atelectasis or pneumonia. Emphysema. Electronically Signed   By: Inge Rise M.D.   On: 06/02/2016 08:36   Ct Angio Chest Pe W Or Wo Contrast  Result Date: 06/03/2016 CLINICAL DATA:  Cough and shortness of breath for weeks. EXAM: CT ANGIOGRAPHY CHEST WITH CONTRAST TECHNIQUE: Multidetector CT imaging of the chest was performed using the standard protocol during bolus administration of intravenous contrast. Multiplanar CT image reconstructions and MIPs were obtained to evaluate the vascular anatomy. CONTRAST:  73 mL of Isovue 370 intravenous contrast COMPARISON:  Chest radiograph, 06/02/2016 FINDINGS: Cardiovascular: There is satisfactory opacification of the pulmonary arteries. However, the study is limited by respiratory motion which limits evaluation of the segmental and subsegmental pulmonary arteries, particularly in the lower lobes. Allowing for this limitation, there is no evidence of a pulmonary embolus. The great vessels are normal in caliber. Atherosclerotic calcifications are noted along the thoracic aorta. No dissection. There are dense coronary artery calcifications. The heart is mildly enlarged. There changes from previous CABG surgery. Mediastinum/Nodes: No neck base, axillary, mediastinal or hilar masses or adenopathy. Lungs/Pleura: There is bilateral lower lobe bronchial wall thickening with peribronchovascular airspace opacity, most confluent in the posterior lung bases. Milder opacity lies at the bases of the right middle lobe and left upper lobe lingula, consistent with atelectasis. Mild paraseptal and centrilobular emphysema is noted in the upper lobes. There is no convincing pulmonary edema. No significant  pleural effusions. Upper Abdomen: Low-density liver lesions consistent with cysts. Mild bilateral renal cortical thinning. Partial fatty replacement of the pancreas. No acute findings. Musculoskeletal: No fracture or acute finding. No osteoblastic or osteolytic lesions. Degenerative changes noted throughout the visualized spine. Bones are diffusely demineralized. Review of the MIP images confirms the above findings. IMPRESSION: 1. Study mildly limited by respiratory motion. Allowing for this, there is no evidence of a pulmonary embolus. 2. Lower lobe bronchial wall thickening with associated patchy peribronchovascular opacity and more confluent opacity at the posterior lung bases. Suspect a combination of bronchitis and/or bronchopneumonia and atelectasis. No evidence of pulmonary edema. 3. Mild cardiomegaly, changes from previous CABG surgery and dense coronary artery calcifications. Electronically Signed   By: Lajean Manes M.D.   On: 06/03/2016 12:20    EKG & Cardiac Imaging    EKG: NSR, HR 95, with PAC's, and slight ST depression in the lateral leads.  Echocardiogram: 05/19/2016 Study Conclusions  - Left ventricle: The cavity size was normal. Wall thickness was   increased in a pattern of mild LVH. Systolic function was normal.   The estimated ejection fraction was in the range of 55% to 65%.   Wall motion was normal; there were no regional wall motion   abnormalities. Doppler parameters are consistent with abnormal   left ventricular relaxation (grade 1 diastolic dysfunction). - Aortic valve: Mildly calcified annulus. Mildly calcified   leaflets. Valve area (VTI): 1.9 cm^2. Valve area (Vmax): 1.88   cm^2. Valve area (Vmean): 1.71 cm^2.  Assessment & Plan    1. Acute on Chronic Diastolic CHF - EF 17-51% by echo in 05/2016. Presented with worsening dyspnea and admitted for HCAP.  - was on Lasix 8m daily PTA (recently increased from 270mdaily by PCP).  - BNP 223 on admission with CXR  showing a degree of congestive heart  failure with superimposed bibasilar pneumonia not being excluded.  - started on IV Lasix 26m BID with no weights recorded and output minimal at -200 cc today, however he reports frequent urination. - will obtain daily weights (baseline 220-222lbs) and I&O's. On examination, he does not appear overly volume overloaded. Can likely switch back to PO Lasix tomorrow.   2. CAD/ Elevated Troponin - s/p CABG in 1995, DES in 2008, 2009, and most recent in 2012 with DES to SVG-OM. EF preserved at  55-65% by echo in 05/2016. - Troponin values flat at 0.04 and 0.03. EKG shows NSR, HR 95, with PAC's, and slight ST depression in the lateral leads.  - would not pursue further ischemic evaluation at this time in the setting of his acute respiratory illness and no recent anginal symptoms.   3. Tachybrady syndrome - s/p PPM placement in 2009 - followed by Dr. KCaryl Comes 4. HTN - BP at 143/52 - 182/82 in the past 24 hours. - will resume home Valsartan 1666mBID (Avapro while admitted).   5. HCAP/COPD  - on 3L [place] at home. Receiving Cefepime and Vancomycin - per admitting team  Signed, BrErma HeritagePA-C 06/30/2016, 12:01 PM Pager: [phone]

## 2016-06-30 NOTE — Progress Notes (Signed)
Initial Nutrition Assessment  DOCUMENTATION CODES:   Obesity unspecified  INTERVENTION:  Continue Ensure Enlive BID. Each bottle provides 350 kcal, 20 g protein.   Encouraged PO intake.  NUTRITION DIAGNOSIS:   Increased nutrient needs related to cancer and cancer related treatments as evidenced by estimated needs.   GOAL:   Patient will meet greater than or equal to 90% of their needs   MONITOR:   PO intake, I & O's, Labs, Weight trends, Supplement acceptance  REASON FOR ASSESSMENT:   Malnutrition Screening Tool    ASSESSMENT:   Eric Contino Nortonis a 83 y.o.malePer EMS pt is from an independent living facility with complaints of SOB that has increased this morning. Hx of CHF and COPD. Pt wears 3L oxygen at home at all times. Pt uses walker at home. Pt had an appointment with his pulmonologist today but was unable to go because of no transportation. EMS 5 mg albuterol and 0.5 mg atrovent. EMS reports pt has rales, rhonchi, and wheezing. Pt wasreporting that he was having significant shortness of breath with ambulation. His pulse ox drops in the low 80s with ambulation. He reports productive cough. He denies fever and chills. He reports that he has been without his Lasix for some time. He does report that his wheezing and coughing has progressively worsened over the past 2 weeks since being discharged from the hospital. He reports occasional chest pain associated with deep coughing.   Patient reported no appetite changes, no abdominal pain, no chewing issues, or nausea/vomiting. Patient stated his appetite had not changed, but due to chemotherapy drug foods were tasteless. PTA he was eating 1-2 meals per day. He said it was hard to eat at times due to difficulty breathing. He also stated that swallowing medications were difficult at times. Patient reports that chart weight of 214 pounds sounded appropriate for UBW.  Patient was currently prescribed Ensure Enlive twice daily.  Patient stated he liked this supplement and agreed to continue. RD provided supplement at bedside.   Currently PO intake is 75% completion.  NFPE shows no signs of muscle/fat depletion.   Labs Reviewed: CBG: 303, Glucose 215, Chloride 100, Sodium 131.  Medications Reviewed: Lasix, Novolog, Protonix, Pravachol  Diet Order:  Diet heart healthy/carb modified Room service appropriate? Yes; Fluid consistency: Thin  Skin:  Reviewed, no issues  Last BM:  1/22  Height:   Ht Readings from Last 1 Encounters:  06/23/16 5\' 6"  (1.676 m)    Weight:   Wt Readings from Last 1 Encounters:  06/23/16 214 lb (97.1 kg)    Ideal Body Weight:  64.54 kg  BMI:  34.6 kg/m^2 obese Estimated Nutritional Needs:   Kcal:  1850-2050 (19-21 kcal/kg)  Protein:  97-116 g (1-1.2g/kg)  Fluid:  1.8-2.0 L/day  EDUCATION NEEDS:   No education needs identified at this time  Juliann Pulse M.S. Nutrition Dietetic Intern

## 2016-06-30 NOTE — NC FL2 (Addendum)
[place] LEVEL OF CARE SCREENING TOOL     IDENTIFICATION  Patient Name: Eric [name] Birthdate: [birth year] Sex: male Admission Date (Current Location): 06/29/2016  [facility] and [place] Number:  Herbalist and Address:  [facility],  [place] [address] [place]      Provider Number: [id number]  Attending Physician Name and Address:  Murlean Iba, MD  Relative Name and Phone Number:       Current Level of Care: Hospital Recommended Level of Care: [place] Prior Approval Number:    Date Approved/Denied:   PASRR Number: [id number]  Discharge Plan: SNF    Current Diagnoses: Patient Active Problem List   Diagnosis Date Noted  . Pneumonia 06/29/2016  . Acute sinusitis 06/26/2016  . Acute respiratory failure with hypoxia ([place]) 05/22/2016  . COPD exacerbation ([place]) 05/19/2016  . Diarrhea due to drug 01/28/2016  . Anemia in neoplastic disease 12/12/2015  . Acquired pancytopenia ([place]) 10/28/2015  . Tachy-brady syndrome ([place]) 07/10/2015  . Bilateral carotid artery stenosis 07/10/2015  . Essential hypertension 07/10/2015  . CML (chronic myeloid leukemia) ([place]) 06/26/2015  . Myeloproliferative neoplasm ([place]) 06/25/2015  . Dizziness 05/27/2015  . Back pain 12/10/2014  . Obesity (BMI 30-39.9) 12/08/2014  . Hartmann's pouch of intestine ([place]) 12/08/2014  . HCAP (healthcare-associated pneumonia) 12/07/2014  . CAD (coronary artery disease) of artery bypass graft 11/28/2014  . Chronic diastolic heart failure ([place]) 11/24/2014  . Hyponatremia 11/22/2014  . GERD (gastroesophageal reflux disease) 03/27/2014  . Generalized weakness 02/08/2014  . Pacemaker 04/10/2013  . Obstructive sleep apnea 04/11/2011  . Type 2 diabetes mellitus, controlled ([place]) 01/18/2009  . Dyslipidemia 01/18/2009  . Hypertensive heart disease with CHF ([place]) 01/18/2009  . Coronary atherosclerosis 01/18/2009  . GERD 01/18/2009  . COPD  mixed type ([place]) 12/27/2006    Orientation RESPIRATION BLADDER Height & Weight     Time, Situation, Place, Self  O2 (3 liters) Continent Weight:   Height:     BEHAVIORAL SYMPTOMS/MOOD NEUROLOGICAL BOWEL NUTRITION STATUS      Colostomy Bag Diet (Heart Healthy/Carb modified)  AMBULATORY STATUS COMMUNICATION OF NEEDS Skin   Limited Assist Verbally Normal                       Personal Care Assistance Level of Assistance  Bathing, Feeding, Dressing Bathing Assistance: Limited assistance Feeding assistance: Independent Dressing Assistance: Limited assistance     Functional Limitations Info  Hearing   Hearing Info: Impaired (Pt wears hearing aids)      SPECIAL CARE FACTORS FREQUENCY  PT (By licensed PT), OT (By licensed OT)     PT Frequency: 5 OT Frequency: 5            Contractures      Additional Factors Info  Code Status, Allergies Code Status Info: Full Code Allergies Info: ACTOS PIOGLITAZONE HYDROCHLORIDE, BUPRENORPHINE HCL, CELEBREX CELECOXIB, DEMEROL, MEPERIDINE, MORPHINE AND RELATED, CIPROFLOXACIN, METFORMIN, ZOCOR SIMVASTATIN            Current Medications (06/30/2016):  This is the current hospital active medication list Current Facility-Administered Medications  Medication Dose Route Frequency Provider Last Rate Last Dose  . 0.9 %  sodium chloride infusion  250 mL Intravenous PRN Clanford Marisa Hua, MD      . acetaminophen (TYLENOL) tablet 650 mg  650 mg Oral Q4H PRN Clanford L Johnson, MD      . albuterol (PROVENTIL) (2.5 MG/3ML) 0.083% nebulizer solution 2.5 mg  2.5 mg Inhalation Q4H PRN Clanford Marisa Hua, MD   2.5 mg at 06/30/16 0513  . aspirin EC tablet 81 mg  81 mg Oral QPM Clanford Marisa Hua, MD   81 mg at 06/30/16 1723  . ceFEPIme (MAXIPIME) 1 g in dextrose 5 % 50 mL IVPB  1 g Intravenous Q8H Clanford L Johnson, MD   1 g at 06/30/16 1427  . feeding supplement (ENSURE ENLIVE) (ENSURE ENLIVE) liquid 237 mL  237 mL Oral BID BM Clanford L Johnson,  MD   237 mL at 06/30/16 1437  . furosemide (LASIX) injection 40 mg  40 mg Intravenous Q12H Clanford Marisa Hua, MD   40 mg at 06/30/16 1723  . guaiFENesin-dextromethorphan (ROBITUSSIN DM) 100-10 MG/5ML syrup 5 mL  5 mL Oral Q4H PRN Clanford L Johnson, MD   5 mL at 06/30/16 1023  . heparin injection 5,000 Units  5,000 Units Subcutaneous Q8H Clanford Marisa Hua, MD   5,000 Units at 06/30/16 1426  . HYDROcodone-acetaminophen (NORCO) 7.5-325 MG per tablet 1 tablet  1 tablet Oral Q6H PRN Clanford Marisa Hua, MD   1 tablet at 06/29/16 2212  . insulin aspart (novoLOG) injection 0-15 Units  0-15 Units Subcutaneous TID WC Clanford L Johnson, MD      . insulin aspart (novoLOG) injection 0-5 Units  0-5 Units Subcutaneous QHS Clanford L Johnson, MD      . insulin aspart (novoLOG) injection 4 Units  4 Units Subcutaneous TID WC Clanford L Johnson, MD      . ipratropium-albuterol (DUONEB) 0.5-2.5 (3) MG/3ML nebulizer solution 3 mL  3 mL Nebulization Q6H Clanford L Johnson, MD   3 mL at 06/30/16 1316  . irbesartan (AVAPRO) tablet 150 mg  150 mg Oral BID Erma Heritage, [place]   150 mg at 06/30/16 1427  . ondansetron (ZOFRAN) injection 4 mg  4 mg Intravenous Q6H PRN Clanford L Johnson, MD      . pantoprazole (PROTONIX) EC tablet 40 mg  40 mg Oral Daily Clanford Marisa Hua, MD   40 mg at 06/30/16 0751  . polyvinyl alcohol (LIQUIFILM TEARS) 1.4 % ophthalmic solution 2 drop  2 drop Both Eyes PRN Berton Mount, RPH   2 drop at 06/30/16 1023  . pravastatin (PRAVACHOL) tablet 60 mg  60 mg Oral QHS Clanford Marisa Hua, MD   60 mg at 06/29/16 2244  . sodium chloride flush (NS) 0.9 % injection 3 mL  3 mL Intravenous Q12H Clanford L Johnson, MD   3 mL at 06/29/16 2200  . sodium chloride flush (NS) 0.9 % injection 3 mL  3 mL Intravenous PRN Clanford L Johnson, MD      . vancomycin (VANCOCIN) IVPB 750 mg/150 ml premix  750 mg Intravenous Q12H Berton Mount, RPH   750 mg at 06/30/16 1723     Discharge Medications: Please  see discharge summary for a list of discharge medications.  Relevant Imaging Results:  Relevant Lab Results:   Additional Information [id number]  Alphonse Guild Riffey, LCSWA

## 2016-06-30 NOTE — Progress Notes (Signed)
CSW spoke to the pt and confirmed the pt's plan for discharge is to return to his independent living facility at the [facility] [place],  Pt's discharge date is still unknown.  Pt stated his desire to the CSW to have assistance at a minimum of on a weekly basis to assist him in his home.  CSW informed pt that CSW would notify case management of his wishes for more information on assistance in the home.  CSW asked pt is he would be interested in SNF placement and as of 06/30/16 pt reported he would not be interested in SNF placement, but stated that if he were to change his mind that he would be interested in going to [facility].  Pt also stated that he would definately not be interested in going to [facility]  CSW will send pt's information to [facility] via the hub to determine if the pt can be accepted in the even that the pt changes his mind and desires SNF placement before he is discharged.  Alphonse Guild. Anneta Rounds, Latanya Presser, LCAS Clinical Social Worker Ph: [phone]    '

## 2016-06-30 NOTE — Evaluation (Signed)
Physical Therapy Evaluation Patient Details Name: Eric [name] MRN: [id number] DOB: Sep 19, 1926 Today's Date: 06/30/2016   History of Present Illness  Eric [name] is a 83 y.o. male Per EMS pt is from an independent living facility with complaints of SOB 06/30/15  that has increased  Hx of CHF and COPD. Pt wears 3L oxygen at home at all times. Pt uses walker at home.  Patient was evaluated and noted to be hypoxic and chest x-ray reveals congestive heart failure with superimposed pneumonia  Clinical Impression  The patient appears anxious about his current medical condition and having to return to the hospital. He could benefit from  Increased assistance at his home as he was recently in the hospital. He has multiple medical devices to take care of. He declines to go to SNF for rehab. Pt admitted with above diagnosis. Pt currently with functional limitations due to the deficits listed below (see PT Problem List).  Pt will benefit from skilled PT to increase their independence and safety with mobility to allow discharge to the venue listed below.       Follow Up Recommendations SNF;Home health PT;Supervision/Assistance - 24 hour    Equipment Recommendations   (The patient requests a 6.5 pound oxygen tank that he can manage.)    Recommendations for Other Services       Precautions / Restrictions Precautions Precautions: Fall Precaution Comments: on oxygen, colostomy, hernia      Mobility  Bed Mobility Overal bed mobility: Needs Assistance Bed Mobility: Supine to Sit     Supine to sit: Supervision     General bed mobility comments: not assist  Transfers Overall transfer level: Needs assistance Equipment used: Rolling walker (2 wheeled) Transfers: Sit to/from Stand Sit to Stand: Mod assist         General transfer comment: cues for safety, to slow down as he was tangled in the lines.  Ambulation/Gait Ambulation/Gait assistance: Min assist;+2  safety/equipment Ambulation Distance (Feet): 100 Feet Assistive device: Rolling walker (2 wheeled) Gait Pattern/deviations: Step-through pattern;Staggering left;Staggering right     General Gait Details: at times unbalanced, steady assist . On 3 liters [place], sats >89%, HR95  Stairs            Wheelchair Mobility    Modified Rankin (Stroke Patients Only)       Balance Overall balance assessment: Needs assistance Sitting-balance support: No upper extremity supported;Feet supported Sitting balance-Leahy Scale: Good     Standing balance support: During functional activity;Bilateral upper extremity supported Standing balance-Leahy Scale: Poor Standing balance comment: patient is quite jittery and shakey in the  extremities.                             Pertinent Vitals/Pain Pain Assessment: Faces Faces Pain Scale: Hurts a little bit Pain Location: rectum Pain Descriptors / Indicators: Sore Pain Intervention(s): Monitored during session    Home Living Family/patient expects to be discharged to:: Private residence Living Arrangements: Alone   Type of Home: Independent living facility Home Access: Level entry     Home Layout: One level Home Equipment: Shower seat - built in;Walker - 4 wheels Additional Comments: pt drives and does everything himself    Prior Function Level of Independence: Independent with assistive device(s)         Comments: uses rollator vs cane     Hand Dominance        Extremity/Trunk Assessment   Upper Extremity Assessment  Upper Extremity Assessment: Generalized weakness    Lower Extremity Assessment Lower Extremity Assessment: Generalized weakness    Cervical / Trunk Assessment Cervical / Trunk Assessment: Kyphotic  Communication   Communication: HOH  Cognition Arousal/Alertness: Awake/alert Behavior During Therapy: Restless;Anxious Overall Cognitive Status: Within Functional Limits for tasks assessed                  General Comments: gets emotuional at times,wants to be independent, hasn't driven and he is upset about that. wants a bath and expresses  that he is upset that he has not had one.     General Comments      Exercises     Assessment/Plan    PT Assessment Patient needs continued PT services  PT Problem List Decreased strength;Decreased activity tolerance;Decreased balance;Decreased mobility;Cardiopulmonary status limiting activity;Decreased knowledge of precautions;Decreased safety awareness;Decreased knowledge of use of DME          PT Treatment Interventions DME instruction;Gait training;Functional mobility training;Therapeutic activities;Therapeutic exercise;Patient/family education    PT Goals (Current goals can be found in the Care Plan section)  Acute Rehab PT Goals Patient Stated Goal: to take care of myself PT Goal Formulation: With patient Time For Goal Achievement: 07/14/16 Potential to Achieve Goals: Fair    Frequency Min 3X/week   Barriers to discharge Decreased caregiver support The patient has HHPT,     Co-evaluation               End of Session Equipment Utilized During Treatment: Gait belt;Oxygen Activity Tolerance: Patient tolerated treatment well Patient left: in chair;with call bell/phone within reach;with chair alarm set;with nursing/sitter in room Nurse Communication: Mobility status         Time: 1352-1419 PT Time Calculation (min) (ACUTE ONLY): 27 min   Charges:   PT Evaluation $PT Eval Moderate Complexity: 1 Procedure PT Treatments $Gait Training: 8-22 mins   PT G Codes:        Claretha Cooper 06/30/2016, 5:05 PM Tresa Endo PT [phone]

## 2016-06-30 NOTE — Progress Notes (Signed)
SLP Cancellation Note  Patient Details Name: Eric [name] MRN: [id number] DOB: 1926-11-29   Cancelled treatment:       Reason Eval/Treat Not Completed: Patient unavailable - working with PT.   [name] 06/30/2016, 2:09 PM

## 2016-06-30 NOTE — Progress Notes (Addendum)
Pt refused CPAP-qhs.  Education provided.  Pt stated he slept better last night with his nasal cannula in and wished to do so again tonight.  Pt encouraged to contact RT should he change his mind.

## 2016-06-30 NOTE — Care Management Note (Signed)
Case Management Note  Patient Details  Name: DUSTY WINDHOLZ MRN: [id number] Date of Birth: 11/06/1926  Subjective/Objective: 83 y/o m admitted w/PNA. Readmit 12/26-12/30-COPD. From [facility]. Active w/[facility]-HHRN,HHPT-rep Vickie aware.PT cons-await recc.                   Action/Plan:d/c plan may need higher level.   Expected Discharge Date:   (unknown)               Expected Discharge Plan:  Assisted Living / Rest Home  In-House Referral:  Clinical Social Work  Discharge planning Services  CM Consult  Post Acute Care Choice:  Home Health (Active w/[facility]-HH nsg,HH PT) Choice offered to:     DME Arranged:    DME Agency:     HH Arranged:    HH Agency:     Status of Service:  In process, will continue to follow  If discussed at Long Length of Stay Meetings, dates discussed:    Additional Comments:  Dessa Phi, RN 06/30/2016, 11:25 AM

## 2016-07-01 ENCOUNTER — Inpatient Hospital Stay (HOSPITAL_COMMUNITY): Payer: Medicare Other

## 2016-07-01 ENCOUNTER — Telehealth: Payer: Self-pay

## 2016-07-01 ENCOUNTER — Other Ambulatory Visit: Payer: Self-pay | Admitting: *Deleted

## 2016-07-01 DIAGNOSIS — E7800 Pure hypercholesterolemia, unspecified: Secondary | ICD-10-CM

## 2016-07-01 DIAGNOSIS — I2510 Atherosclerotic heart disease of native coronary artery without angina pectoris: Secondary | ICD-10-CM

## 2016-07-01 DIAGNOSIS — I248 Other forms of acute ischemic heart disease: Secondary | ICD-10-CM

## 2016-07-01 LAB — CBC WITH DIFFERENTIAL/PLATELET
BASOS ABS: 0 10*3/uL (ref 0.0–0.1)
Basophils Relative: 0 %
EOS ABS: 0 10*3/uL (ref 0.0–0.7)
Eosinophils Relative: 0 %
HCT: 21.5 % — ABNORMAL LOW (ref 39.0–52.0)
Hemoglobin: 7 g/dL — ABNORMAL LOW (ref 13.0–17.0)
Lymphocytes Relative: 8 %
Lymphs Abs: 0.8 10*3/uL (ref 0.7–4.0)
MCH: 31.1 pg (ref 26.0–34.0)
MCHC: 32.6 g/dL (ref 30.0–36.0)
MCV: 95.6 fL (ref 78.0–100.0)
MONO ABS: 0.9 10*3/uL (ref 0.1–1.0)
Monocytes Relative: 9 %
NEUTROS PCT: 83 %
Neutro Abs: 8.1 10*3/uL — ABNORMAL HIGH (ref 1.7–7.7)
PLATELETS: 290 10*3/uL (ref 150–400)
RBC: 2.25 MIL/uL — ABNORMAL LOW (ref 4.22–5.81)
RDW: 15.9 % — AB (ref 11.5–15.5)
WBC: 9.8 10*3/uL (ref 4.0–10.5)

## 2016-07-01 LAB — BASIC METABOLIC PANEL
Anion gap: 10 (ref 5–15)
BUN: 33 mg/dL — AB (ref 6–20)
CHLORIDE: 99 mmol/L — AB (ref 101–111)
CO2: 21 mmol/L — ABNORMAL LOW (ref 22–32)
Calcium: 9.2 mg/dL (ref 8.9–10.3)
Creatinine, Ser: 1.03 mg/dL (ref 0.61–1.24)
Glucose, Bld: 184 mg/dL — ABNORMAL HIGH (ref 65–99)
POTASSIUM: 4.4 mmol/L (ref 3.5–5.1)
SODIUM: 130 mmol/L — AB (ref 135–145)

## 2016-07-01 LAB — MAGNESIUM: MAGNESIUM: 2.3 mg/dL (ref 1.7–2.4)

## 2016-07-01 LAB — GLUCOSE, CAPILLARY
GLUCOSE-CAPILLARY: 228 mg/dL — AB (ref 65–99)
GLUCOSE-CAPILLARY: 179 mg/dL — AB (ref 65–99)
Glucose-Capillary: 175 mg/dL — ABNORMAL HIGH (ref 65–99)

## 2016-07-01 LAB — PREPARE RBC (CROSSMATCH)

## 2016-07-01 MED ORDER — IPRATROPIUM-ALBUTEROL 0.5-2.5 (3) MG/3ML IN SOLN
3.0000 mL | RESPIRATORY_TRACT | Status: DC
  Administered 2016-07-01 (×4): 3 mL via RESPIRATORY_TRACT
  Filled 2016-07-01 (×3): qty 3

## 2016-07-01 MED ORDER — IPRATROPIUM-ALBUTEROL 0.5-2.5 (3) MG/3ML IN SOLN
3.0000 mL | Freq: Four times a day (QID) | RESPIRATORY_TRACT | Status: DC
  Administered 2016-07-01 – 2016-07-03 (×7): 3 mL via RESPIRATORY_TRACT
  Filled 2016-07-01 (×7): qty 3

## 2016-07-01 MED ORDER — FUROSEMIDE 40 MG PO TABS
40.0000 mg | ORAL_TABLET | Freq: Two times a day (BID) | ORAL | Status: DC
  Administered 2016-07-01 – 2016-07-03 (×4): 40 mg via ORAL
  Filled 2016-07-01 (×4): qty 1

## 2016-07-01 MED ORDER — SODIUM CHLORIDE 0.9 % IV SOLN: Freq: Once | INTRAVENOUS | Status: DC

## 2016-07-01 MED ORDER — FUROSEMIDE 10 MG/ML IJ SOLN
40.0000 mg | Freq: Once | INTRAMUSCULAR | Status: AC
  Administered 2016-07-01: 40 mg via INTRAVENOUS
  Filled 2016-07-01: qty 4

## 2016-07-01 MED ORDER — CLONAZEPAM 0.5 MG PO TABS
0.2500 mg | ORAL_TABLET | Freq: Three times a day (TID) | ORAL | Status: DC | PRN
Start: 2016-07-01 — End: 2016-07-03
  Administered 2016-07-01 – 2016-07-02 (×3): 0.25 mg via ORAL
  Filled 2016-07-01 (×3): qty 1

## 2016-07-01 MED ORDER — METOPROLOL TARTRATE 25 MG PO TABS
25.0000 mg | ORAL_TABLET | Freq: Two times a day (BID) | ORAL | Status: DC
  Administered 2016-07-01 – 2016-07-03 (×5): 25 mg via ORAL
  Filled 2016-07-01 (×5): qty 1

## 2016-07-01 MED ORDER — HYDROCODONE-ACETAMINOPHEN 7.5-325 MG PO TABS
1.0000 | ORAL_TABLET | ORAL | Status: DC | PRN
  Administered 2016-07-01 – 2016-07-02 (×2): 1 via ORAL
  Filled 2016-07-01 (×2): qty 1

## 2016-07-01 NOTE — Progress Notes (Addendum)
LCSWA met with patient and daughter at bedside to assist with pt. Disposition. Patient reports at this time he is agreeable to go to [facility] only. LCSWA explained to patient that it is not a guarantee [facility] will have a bed once he is medically stable for discharge. Patient expressed the idea of going back home with Home Health services. Patient reports he is feeling weak and express difficulty standing up.  LCSWA and daughter discussed safest discharge plan for patient is to go rehab before going home.  LCSWA spoke with [facility] admission coordinator, she reports to call in the a.m for bed availability.  [facility] faxed patient information to other SNF's in the area.  Will follow up with bed offers.

## 2016-07-01 NOTE — Progress Notes (Signed)
Progress Note  Patient Name: Eric [name] Date of Encounter: 07/01/2016  Primary Cardiologist: Dr. Smith/Dr. Caryl Comes  Subjective   Feeling well.  Breathing has improved.  Inpatient Medications    Scheduled Meds: . aspirin EC  81 mg Oral QPM  . ceFEPime (MAXIPIME) IV  1 g Intravenous Q8H  . feeding supplement (ENSURE ENLIVE)  237 mL Oral BID BM  . furosemide  40 mg Intravenous Q12H  . heparin  5,000 Units Subcutaneous Q8H  . insulin aspart  0-15 Units Subcutaneous TID WC  . insulin aspart  0-5 Units Subcutaneous QHS  . insulin aspart  4 Units Subcutaneous TID WC  . ipratropium-albuterol  3 mL Nebulization Q4H  . irbesartan  150 mg Oral BID  . pantoprazole  40 mg Oral Daily  . pravastatin  60 mg Oral QHS  . sodium chloride flush  3 mL Intravenous Q12H  . vancomycin  750 mg Intravenous Q12H   Continuous Infusions:  PRN Meds: sodium chloride, acetaminophen, albuterol, clonazePAM, guaiFENesin-dextromethorphan, HYDROcodone-acetaminophen, ondansetron (ZOFRAN) IV, polyvinyl alcohol, sodium chloride flush   Vital Signs    Vitals:   06/30/16 2101 07/01/16 0104 07/01/16 0442 07/01/16 0805  BP: (!) 143/61  (!) 157/60   Pulse: 85  75   Resp: 20  (!) 22   Temp: 98 F (36.7 C)  97.6 F (36.4 C)   TempSrc: Oral  Oral   SpO2: 98% 100% 97% 96%    Intake/Output Summary (Last 24 hours) at 07/01/16 1116 Last data filed at 07/01/16 0800  Gross per 24 hour  Intake             1000 ml  Output             1700 ml  Net             -700 ml   There were no vitals filed for this visit.  Telemetry    Sinus rhythm.  PVCx - Personally Reviewed  ECG    n/a - Personally Reviewed  Physical Exam   GEN: No acute distress.  Neck: No JVD Cardiac: RRR, no murmurs, rubs, or gallops.  Respiratory: Clear to auscultation bilaterally.  No crackles, wheezes or rhonchi GI: Soft, nontender, non-distended  MS: No edema; No deformity. Neuro:  AAOx3. Psych: Normal affect  Labs      Chemistry Recent Labs Lab 06/29/16 1435 06/29/16 2237 06/30/16 0552 07/01/16 0625  NA 131*  --  131* 130*  K 3.9  --  4.8 4.4  CL 98*  --  100* 99*  CO2 21*  --  21* 21*  GLUCOSE 135*  --  215* 184*  BUN 20  --  19 33*  CREATININE 1.02 1.08 1.05 1.03  CALCIUM 8.9  --  8.9 9.2  GFRNONAA >60 60* >60 >60  GFRAA >60 >60 >60 >60  ANIONGAP 12  --  10 10     Hematology Recent Labs Lab 06/29/16 2237 06/30/16 0552 07/01/16 0625  WBC 5.2 3.5* 9.8  RBC 2.32* 2.39* 2.25*  HGB 7.3* 7.2* 7.0*  HCT 22.4* 22.5* 21.5*  MCV 96.6 94.1 95.6  MCH 31.5 30.1 31.1  MCHC 32.6 32.0 32.6  RDW 16.2* 15.9* 15.9*  PLT 266 265 290    Cardiac Enzymes Recent Labs Lab 06/29/16 2237 06/30/16 0552 06/30/16 1011 06/30/16 1543  TROPONINI 0.04* 0.03* 0.03* 0.03*   No results for input(s): TROPIPOC in the last 168 hours.   BNP Recent Labs Lab 06/29/16 1435  BNP  223.0*     DDimer No results for input(s): DDIMER in the last 168 hours.   Radiology    Dg Chest 2 View  Result Date: 06/29/2016 CLINICAL DATA:  Shortness of Breath.  History of prostate carcinoma EXAM: CHEST  2 VIEW COMPARISON:  Chest radiograph June 02, 2016 and chest CT December 2017 2017. FINDINGS: There is cardiomegaly with pulmonary venous hypertension. Pacemaker leads are attached to the right atrium and right ventricle. Patient is status post coronary artery bypass grafting. There are pleural effusions bilaterally. There is patchy interstitial and alveolar edema in both mid and lower lung zones. There is aortic atherosclerosis. There is degenerative change in the thoracic spine. No adenopathy. No blastic or lytic bone lesions. IMPRESSION: Findings consistent with a degree of congestive heart failure. Superimposed bibasilar pneumonia cannot be excluded radiographically. Electronically Signed   By: Lowella Grip III M.D.   On: 06/29/2016 15:13   Dg Chest Port 1 View  Result Date: 07/01/2016 CLINICAL DATA:  Shortness of  breath and wheezing EXAM: PORTABLE CHEST 1 VIEW COMPARISON:  06/29/2016 FINDINGS: Cardiac shadow is mildly enlarged but stable. Pacing device is again seen and stable. Postsurgical changes are again noted. Lungs are well aerated bilaterally. The bibasilar infiltrative changes have improved significantly in the interval. Persistent interstitial edema is noted. No acute bony abnormality is noted. IMPRESSION: Improved aeration bilaterally with evidence of persistent interstitial edema. Electronically Signed   By: Inez Catalina M.D.   On: 07/01/2016 08:04    Cardiac Studies   Echo 05/19/16: Study Conclusions  - Left ventricle: The cavity size was normal. Wall thickness was   increased in a pattern of mild LVH. Systolic function was normal.   The estimated ejection fraction was in the range of 55% to 65%.   Wall motion was normal; there were no regional wall motion   abnormalities. Doppler parameters are consistent with abnormal   left ventricular relaxation (grade 1 diastolic dysfunction). - Aortic valve: Mildly calcified annulus. Mildly calcified   leaflets. Valve area (VTI): 1.9 cm^2. Valve area (Vmax): 1.88   cm^2. Valve area (Vmean): 1.71 cm^2.  Patient Profile     Eric [name] is an 60M with CAD s/p CABG and multiple PCIs, hypertension, hyperlipidemia, COPD and s/p PPM here with acute on chronic diastolic heart failure and community acquired pneumonia.  Assessment & Plan    # Acute on chronic diastolic heart failure: [name] is net negative 800 mL in the last 24 hours.  He has been receiving IV lasix bid.  Renal function is stable, though BUN is starting to rise.  We will start lasix 40mg  po bid.  Continue irbesartan. He was on valsartan as an outpatient.  We will start metoprolol 25mg  bid.  This was stopped prior to his PPM and never restarted.   # Demand ischemia: # CAD s/p CABG and PCI: Troponin mildly elevated to 0.03 in the setting of demand ischemia.  No ischemia evaluation at  this time. Continue aspirin.  Adding metoprolol as above.   # CAP: Continue antibiotics per IM.   # Hypertensive heart disease:  Blood pressure is poorly-controlled.  Continue irbesartan and add metoprolol 25mg  bid.   # Hyperlipidemia: Continue pravastatin.   Signed, Skeet Latch, MD  07/01/2016, 11:16 AM

## 2016-07-01 NOTE — Patient Outreach (Signed)
I am concerned that the pt's hemoglobin is falling. He is involved with oncology but oncology has not been consulted. I have contacted Dr. Irwin Brakeman (email) and Dr. Alvy Bimler (by telephone and left a message) regarding my concerns as the pt seems to be getting ready for discharge.  [name] [facility] [place] [phone]

## 2016-07-01 NOTE — Progress Notes (Signed)
Pt constantly talks to himself. He holds conversation with himself as if he talking to someone else. He did this off and on all night.

## 2016-07-01 NOTE — Telephone Encounter (Signed)
Received call from Eric Lair, NP stating patient will likely be discharged from hospital today. Eric [name] was calling to notify Dr. Alvy Bimler of "falling hemoglobin." Dr. Alvy Bimler notified via phone. She will call Dr. Thereasa Solo (hospitalist) to discuss further.

## 2016-07-01 NOTE — Evaluation (Signed)
Clinical/Bedside Swallow Evaluation Patient Details  Name: Eric [name] MRN: [id number] Date of Birth: June 30, 1926  Today'[name] Date: 07/01/2016 Time: SLP Start Time (ACUTE ONLY): 1425 SLP Stop Time (ACUTE ONLY): 1440 SLP Time Calculation (min) (ACUTE ONLY): 15 min  Past Medical History:  Past Medical History:  Diagnosis Date  . ALLERGIC RHINITIS   . ANEMIA-NOS   . AORTIC SCLEROSIS   . Asthma   . CARDIOMYOPATHY, ISCHEMIC   . CAROTID BRUIT, RIGHT 02/27/2008  . Cataract    surgery  . CML (chronic myeloid leukemia) ([place]) 06/26/2015  . COPD   . CORONARY ARTERY DISEASE    a. [name]/p CABG in 1995 b. DES in 2008, 2009, and most recent in 2012 with DES to SVG-OM  . DIABETES MELLITUS-TYPE II    diet controlled  . Diastolic dysfunction, Grade 1 11/24/2014  . Diverticulitis of colon with perforation 11/22/2014  . Diverticulosis   . GERD   . HIATAL HERNIA   . Hx of echocardiogram    Echo (9/15):  Mild LVH, EF 50-55%, no RWMA, Gr 1 DD, MAC, mild LAE.  [name] HYPERLIPIDEMIA   . HYPERTENSION   . Hyponatremia 11/22/2014  . IBS (irritable bowel syndrome)   . LACTOSE INTOLERANCE   . OA (osteoarthritis)   . OBESITY   . Partial small bowel obstruction   . PERIPHERAL VASCULAR DISEASE   . Primary hyperparathyroidism ([place])    Lab Results Component Value Date  PTH 150.7* 02/13/2013  CALCIUM 11.0* 02/13/2013  CAION 1.21 03/15/2008    . Prostate cancer ([place])    seed implants 2004  . SICK SINUS/ TACHY-BRADY SYNDROME 09/2007   [name]/p PPM st judes  . Sleep apnea   . SMALL BOWEL OBSTRUCTION 04/18/2009   [name]: History of  By: Eric Lente MD, Eric [name] Symptomatic diverticulosis 01/18/2009   [name]: Diagnosis of  By: Eric [name], Eric [name]    Past Surgical History:  Past Surgical History:  Procedure Laterality Date  . BACK SURGERY    . Bilateral cataracts    . COLON RESECTION N/A 11/28/2014   Procedure: EXPLORATORY LAPAROTOMY, SIGMOID COLECTOMY WITH COLOSTOMY;  Surgeon: Eric Confer, MD;  Location: [facility]  ORS;  Service: General;  Laterality: N/A;  . COLON SURGERY    . COLONOSCOPY    . CORONARY ARTERY BYPASS GRAFT    . ESOPHAGOGASTRODUODENOSCOPY  multiple  . FLEXIBLE SIGMOIDOSCOPY N/A 09/22/2013   Procedure: FLEXIBLE SIGMOIDOSCOPY;  Surgeon: Eric Mayer, MD;  Location: [facility] ENDOSCOPY;  Service: Endoscopy;  Laterality: N/A;  . INGUINAL HERNIA REPAIR Bilateral   . LUMBAR [place] SURGERY  12/2008  . PACEMAKER INSERTION     DDD/[employer]         Last interrogation 2/13  on chart     Pacemaker guideline order Eric Tamala Julian on chart  . Partial small bowel obstruction  2009  . PENILE PROSTHESIS PLACEMENT    . PTCA  2008, 2009, 2012   with DES  . TOTAL HIP ARTHROPLASTY  08/21/2011   Procedure: TOTAL HIP ARTHROPLASTY;  Surgeon: Eric Hai, MD;  Location: [facility] ORS;  Service: Orthopedics;  Laterality: Right;   HPI:  83 y.o. male admitted with acute on chronic respiratory failure with hypoxia, PNA, COPD, GERD, OSA. CXR 1/22 pleural effusions bilaterally, CHF.    Assessment / Plan / Recommendation Clinical Impression  Pt presents with adequate oral motor strength and function. CN exam unremarkable. No overt [name]/[name] aspiration observed or reported on any consistency. Pt reports no history  of swallowing difficulty. MBS was encouraged, due to pt diagnosis of COPD. which increases risk of silent aspiration, however pt decline participation in objective study. Recommend consideration of MBS on OP basis if lungs do not clear in a timely manner. ST to sign off at this time. Please reconsult if needs arise.    Aspiration Risk  Mild aspiration risk    Diet Recommendation Regular;Thin liquid   Liquid Administration via: Cup;Straw Medication Administration: Whole meds with liquid Supervision: Patient able to self feed Compensations: Minimize environmental distractions;Slow rate;Small sips/bites Postural Changes: Seated upright at 90 degrees;Remain upright for at least 30 minutes after po intake    Other   Recommendations Oral Care Recommendations: Oral care BID   Follow up Recommendations  n/a      Frequency and Duration   n/a         Prognosis Prognosis for Safe Diet Advancement: Good      Swallow Study   General Date of Onset: 06/29/16 HPI: 83 y.o. male admitted with acute on chronic respiratory failure with hypoxia, PNA, COPD, GERD, OSA. CXR 1/22 pleural effusions bilaterally, CHF.  Type of Study: Bedside Swallow Evaluation Previous Swallow Assessment: none Diet Prior to this Study: Regular Temperature Spikes Noted: No Respiratory Status: Nasal cannula History of Recent Intubation: No Behavior/Cognition: Alert;Cooperative Oral Cavity Assessment: Within Functional Limits Oral Care Completed by SLP: Yes Oral Cavity - Dentition: Dentures, bottom;Dentures, top Vision: Functional for self-feeding Self-Feeding Abilities: Able to feed self Patient Positioning: Upright in bed Baseline Vocal Quality: Normal Volitional Cough: Strong Volitional Swallow: Able to elicit    Oral/Motor/Sensory Function Overall Oral Motor/Sensory Function: Within functional limits   Ice Chips Ice chips: Not tested   Thin Liquid Thin Liquid: Within functional limits Presentation: Straw    Nectar Thick Nectar Thick Liquid: Not tested   Honey Thick Honey Thick Liquid: Not tested   Puree Puree: Within functional limits Presentation: Self Fed   Solid   GO   Solid: Within functional limits Presentation: [name], [place] [phone]  Shonna Chock 07/01/2016,2:43 PM

## 2016-07-01 NOTE — Progress Notes (Signed)
[place] TEAM 1 - Stepdown/ICU TEAM  RAUSHAN GILLIS  [id number] DOB: 08/12/1926 DOA: 06/29/2016 PCP: [name], MD    Brief Narrative:  83 y.o.male from an independent living facility w/ chronic CHF and COPD admitted with complaints of SOB.  Pt wears 3L oxygen at home at all times and uses a walker. Pt had an appointment with his pulmonologist but was unable to go because of no transportation. EMS administered 5 mg albuterol and 0.5 mg atrovent after noting rales, rhonchi, and wheezing. He reported he had been without his Lasix for some time.   Subjective: The patient has been quite anxious today.  He has become frustrated with the staff on multiple occasions.  He cannot tell me one particular incident that has led to his anxiety.  He does report ongoing cough and intermittent shortness of breath which she states is contributing significantly to his anxiety.  He denies chest pain nausea vomiting or abdominal pain.  Assessment & Plan:  Acute on chronic respiratory failure with hypoxia secondary to pneumonia and COPD Continue empiric antibiotic therapy - continue nebulizer therapy - no evidence of active wheezing on exam today - stop Vanc as MRSA screen negative and pt clinically stabilizing   Acute exacerbation of diastolic congestive heart failure Cardiology following consultation - transitioning from IV to oral Lasix today - obtain daily weights - net negative ~1L thus far   Normocytic anemia - Hx of CML Spoke with Dr. Alvy Bimler today - patient is due to see her 1/25 for follow-up of his CML - she strongly suggest transfusion during hospital stay as he will have difficulty mounting a bone marrow response to his current anemia - will arrange for 2 units of packed red blood cells this evening with Lasix in between - Dr. Alvy Bimler to follow-up in a.m.  Generalized weakness acutely worsening over the past 2 weeks likely secondary to respiratory failure - goal is for a SNF rehab  stay following this admit   CAD s/p CABG and multiple PCIs Troponin mildly elevated to 0.03 in the setting of demand ischemia - no further evaluation planned per cardiology  HTN Blood pressure reasonably controlled at present  Hyponatremia Felt to be due to volume overload in setting of CHF - follow with ongoing diuresis  OSA continue nightly CPAP therapy, though pt refuses frequently   GERD protonix ordered  Situational anxiety Trial of low dose short term klonopin as well as QHS seroquel - follow   DVT prophylaxis: Subcutaneous heparin Code Status: FULL CODE Family Communication: no family present at time of exam  Disposition Plan: PT suggesting SNF placement - pt agrees only to [facility] - await bed decision at [facility] - f/u CBC - follow volume status   Consultants:  Cardiology Heme/Onc  Procedures: none  Antimicrobials:  Cefepime 1/22 > Vancomycin 1/22 > 1/24    Objective: Blood pressure (!) 147/56, pulse 83, temperature 97.6 F (36.4 C), temperature source Oral, resp. rate (!) 22, SpO2 95 %.  Intake/Output Summary (Last 24 hours) at 07/01/16 1525 Last data filed at 07/01/16 1425  Gross per 24 hour  Intake              800 ml  Output             2000 ml  Net            -1200 ml   Examination: General: No acute respiratory distress - anxious  Lungs: Clear to auscultation bilaterally without wheezes or  crackles - distant breath sounds throughout all fields  Cardiovascular: Regular rate and rhythm - distant HS Abdomen: Nontender, nondistended, soft, bowel sounds positive, no rebound, no ascites, no appreciable mass Extremities: 1+ bilateral lower extremity edema without cyanosis   CBC:  Recent Labs Lab 06/29/16 1435 06/29/16 2237 06/30/16 0552 07/01/16 0625  WBC 5.0 5.2 3.5* 9.8  NEUTROABS 3.8  --  3.3 8.1*  HGB 7.5* 7.3* 7.2* 7.0*  HCT 23.0* 22.4* 22.5* 21.5*  MCV 96.6 96.6 94.1 95.6  PLT 250 266 265 [id number]   Basic Metabolic Panel:  Recent  Labs Lab 06/29/16 1435 06/29/16 2237 06/30/16 0552 07/01/16 0625  NA 131*  --  131* 130*  K 3.9  --  4.8 4.4  CL 98*  --  100* 99*  CO2 21*  --  21* 21*  GLUCOSE 135*  --  215* 184*  BUN 20  --  19 33*  CREATININE 1.02 1.08 1.05 1.03  CALCIUM 8.9  --  8.9 9.2  MG  --   --  2.5* 2.3   GFR: Estimated Creatinine Clearance: 55.1 mL/min (by C-G formula based on SCr of 1.03 mg/dL).  Liver Function Tests: No results for input(s): AST, ALT, ALKPHOS, BILITOT, PROT, ALBUMIN in the last 168 hours. No results for input(s): LIPASE, AMYLASE in the last 168 hours. No results for input(s): AMMONIA in the last 168 hours.  Coagulation Profile: No results for input(s): INR, PROTIME in the last 168 hours.  Cardiac Enzymes:  Recent Labs Lab 06/29/16 2237 06/30/16 0552 06/30/16 1011 06/30/16 1543  TROPONINI 0.04* 0.03* 0.03* 0.03*    HbA1C: Hgb A1c MFr Bld  Date/Time Value Ref Range Status  05/20/2016 02:03 AM 5.8 (H) 4.8 - 5.6 % Final    Comment:    (NOTE)         Pre-diabetes: 5.7 - 6.4         Diabetes: >6.4         Glycemic control for adults with diabetes: <7.0   05/27/2015 03:12 PM 6.9 (H) 4.6 - 6.5 % Final    Comment:    Glycemic Control Guidelines for People with Diabetes:Non Diabetic:  <6%Goal of Therapy: <7%Additional Action Suggested:  >8%     CBG:  Recent Labs Lab 06/30/16 1637 06/30/16 2104 07/01/16 0225 07/01/16 0759 07/01/16 1130  GLUCAP 205* 148* 228* 179* 175*    Recent Results (from the past 240 hour(s))  Culture, sputum-assessment     Status: None   Collection Time: 06/29/16  9:40 PM  Result Value Ref Range Status   Specimen Description SPUTUM  Final   Special Requests NONE  Final   Sputum evaluation THIS SPECIMEN IS ACCEPTABLE FOR SPUTUM CULTURE  Final   Report Status 06/29/2016 FINAL  Final  Culture, respiratory (NON-Expectorated)     Status: None (Preliminary result)   Collection Time: 06/29/16  9:40 PM  Result Value Ref Range Status    Specimen Description SPUTUM  Final   Special Requests NONE Reflexed from [place]:[id number]  Final   Gram Stain   Final    RARE WBC PRESENT, PREDOMINANTLY PMN MODERATE GRAM NEGATIVE RODS RARE GRAM POSITIVE COCCI IN PAIRS Performed at [facility], [place] [address], [place], [place] [address]    Culture ABUNDANT STENOTROPHOMONAS MALTOPHILIA  Final   Report Status PENDING  Incomplete  MRSA PCR Screening     Status: None   Collection Time: 06/30/16  6:35 AM  Result Value Ref Range Status   MRSA by  PCR NEGATIVE NEGATIVE Final    Comment:        The GeneXpert MRSA Assay (FDA approved for NASAL specimens only), is one component of a comprehensive MRSA colonization surveillance program. It is not intended to diagnose MRSA infection nor to guide or monitor treatment for MRSA infections. Performed at [facility], [place] [address] [place], [place] [address]      Scheduled Meds: . aspirin EC  81 mg Oral QPM  . ceFEPime (MAXIPIME) IV  1 g Intravenous Q8H  . feeding supplement (ENSURE ENLIVE)  237 mL Oral BID BM  . furosemide  40 mg Oral BID  . heparin  5,000 Units Subcutaneous Q8H  . insulin aspart  0-15 Units Subcutaneous TID WC  . insulin aspart  0-5 Units Subcutaneous QHS  . insulin aspart  4 Units Subcutaneous TID WC  . ipratropium-albuterol  3 mL Nebulization Q4H  . irbesartan  150 mg Oral BID  . metoprolol tartrate  25 mg Oral BID  . pantoprazole  40 mg Oral Daily  . pravastatin  60 mg Oral QHS  . sodium chloride flush  3 mL Intravenous Q12H  . vancomycin  750 mg Intravenous Q12H     LOS: 2 days   Cherene Altes, MD [facility] Office  [phone] Pager - Text Page per Shea Evans as per below:  On-Call/Text Page:      [email or web address]      password [email or web address]  If 7PM-7AM, please contact night-coverage [email or web address] Password [email or web address] 07/01/2016, 3:25 PM

## 2016-07-02 ENCOUNTER — Ambulatory Visit: Payer: Medicare Other | Admitting: Hematology and Oncology

## 2016-07-02 ENCOUNTER — Other Ambulatory Visit: Payer: Medicare Other

## 2016-07-02 ENCOUNTER — Inpatient Hospital Stay (HOSPITAL_COMMUNITY): Payer: Medicare Other

## 2016-07-02 ENCOUNTER — Encounter (HOSPITAL_COMMUNITY): Payer: Self-pay

## 2016-07-02 ENCOUNTER — Other Ambulatory Visit: Payer: Self-pay | Admitting: Hematology and Oncology

## 2016-07-02 DIAGNOSIS — C9210 Chronic myeloid leukemia, BCR/ABL-positive, not having achieved remission: Secondary | ICD-10-CM

## 2016-07-02 DIAGNOSIS — D649 Anemia, unspecified: Secondary | ICD-10-CM

## 2016-07-02 DIAGNOSIS — I10 Essential (primary) hypertension: Secondary | ICD-10-CM

## 2016-07-02 DIAGNOSIS — J441 Chronic obstructive pulmonary disease with (acute) exacerbation: Secondary | ICD-10-CM

## 2016-07-02 LAB — TYPE AND SCREEN
Blood Product Expiration Date: 201802062359
Blood Product Expiration Date: 201802062359
ISSUE DATE / TIME: 201801241833
ISSUE DATE / TIME: 201801242233
UNIT TYPE AND RH: 6200
Unit Type and Rh: 6200

## 2016-07-02 LAB — COMPREHENSIVE METABOLIC PANEL
ALBUMIN: 3.5 g/dL (ref 3.5–5.0)
ALK PHOS: 62 U/L (ref 38–126)
ALT: 31 U/L (ref 17–63)
ANION GAP: 11 (ref 5–15)
AST: 39 U/L (ref 15–41)
BILIRUBIN TOTAL: 0.7 mg/dL (ref 0.3–1.2)
BUN: 37 mg/dL — AB (ref 6–20)
CALCIUM: 9.4 mg/dL (ref 8.9–10.3)
CO2: 25 mmol/L (ref 22–32)
CREATININE: 1.16 mg/dL (ref 0.61–1.24)
Chloride: 99 mmol/L — ABNORMAL LOW (ref 101–111)
GFR calc Af Amer: >60 mL/min (ref >60)
GFR calc non Af Amer: 55 mL/min — ABNORMAL LOW (ref >60)
GLUCOSE: 110 mg/dL — AB (ref 65–99)
Potassium: 4.2 mmol/L (ref 3.5–5.1)
SODIUM: 135 mmol/L (ref 135–145)
TOTAL PROTEIN: 7.4 g/dL (ref 6.5–8.1)

## 2016-07-02 LAB — CULTURE, RESPIRATORY W GRAM STAIN

## 2016-07-02 LAB — GLUCOSE, CAPILLARY
GLUCOSE-CAPILLARY: 163 mg/dL — AB (ref 65–99)
Glucose-Capillary: 112 mg/dL — ABNORMAL HIGH (ref 65–99)
Glucose-Capillary: 134 mg/dL — ABNORMAL HIGH (ref 65–99)
Glucose-Capillary: 191 mg/dL — ABNORMAL HIGH (ref 65–99)

## 2016-07-02 LAB — CULTURE, RESPIRATORY

## 2016-07-02 LAB — CBC
HEMATOCRIT: 29.3 % — AB (ref 39.0–52.0)
HEMOGLOBIN: 9.7 g/dL — AB (ref 13.0–17.0)
MCH: 30.8 pg (ref 26.0–34.0)
MCHC: 33.1 g/dL (ref 30.0–36.0)
MCV: 93 fL (ref 78.0–100.0)
Platelets: 330 10*3/uL (ref 150–400)
RBC: 3.15 MIL/uL — AB (ref 4.22–5.81)
RDW: 18.5 % — ABNORMAL HIGH (ref 11.5–15.5)
WBC: 8.5 10*3/uL (ref 4.0–10.5)

## 2016-07-02 LAB — LEGIONELLA PNEUMOPHILA SEROGP 1 UR AG: L. PNEUMOPHILA SEROGP 1 UR AG: NEGATIVE

## 2016-07-02 MED ORDER — LEVOFLOXACIN 500 MG PO TABS
500.0000 mg | ORAL_TABLET | Freq: Every day | ORAL | Status: DC
  Administered 2016-07-02 – 2016-07-03 (×2): 500 mg via ORAL
  Filled 2016-07-02 (×2): qty 1

## 2016-07-02 NOTE — Progress Notes (Signed)
Eric [name]   DOB:[birth year]   [id number]    Subjective:     I was notified by the hospitalist team yesterday the patient is hospitalized. He has appointment to see me today to follow-up on CML.The patient has been compliant taking Sprycel until the time of admission. He is currently being managed for pneumonia. He received 2 units of blood transfusion per recommendation from me due to severe anemia in the setting of chronic illness He felt a little stronger after blood transfusion. The patient denies any recent signs or symptoms of bleeding such as spontaneous epistaxis, hematuria or hematochezia.   Objective:  Vitals:   07/02/16 0212 07/02/16 0357  BP: (!) 116/58 139/60  Pulse: 70 76  Resp: 20 (!) 22  Temp: 98 F (36.7 C) 98.1 F (36.7 C)     Intake/Output Summary (Last 24 hours) at 07/02/16 1215 Last data filed at 07/02/16 0900  Gross per 24 hour  Intake             1520 ml  Output             1900 ml  Net             -380 ml    GENERAL:alert, no distress and comfortable SKIN: skin color, texture, turgor are normal, no rashes or significant lesions EYES: normal, Conjunctiva are pink and non-injected, sclera clear OROPHARYNX:no exudate, no erythema and lips, buccal mucosa, and tongue normal  NECK: supple, thyroid normal size, non-tender, without nodularity LYMPH:  no palpable lymphadenopathy in the cervical, axillary or inguinal LUNGS: Bilateral crackles at the lung bases HEART: regular rate & rhythm and no murmurs and no lower extremity edema ABDOMEN:abdomen soft, non-tender and normal bowel sounds Musculoskeletal:no cyanosis of digits and no clubbing  NEURO: alert & oriented x 3 with fluent speech, no focal motor/sensory deficits   Labs:  Lab Results  Component Value Date   WBC 8.5 07/02/2016   HGB 9.7 (L) 07/02/2016   HCT 29.3 (L) 07/02/2016   MCV 93.0 07/02/2016   PLT 330 07/02/2016   NEUTROABS 8.1 (H) 07/01/2016    Lab Results  Component Value Date   NA 135 07/02/2016   K 4.2 07/02/2016   CL 99 (L) 07/02/2016   CO2 25 07/02/2016    Studies:  Dg Chest Port 1 View  Result Date: 07/01/2016 CLINICAL DATA:  Shortness of breath and wheezing EXAM: PORTABLE CHEST 1 VIEW COMPARISON:  06/29/2016 FINDINGS: Cardiac shadow is mildly enlarged but stable. Pacing device is again seen and stable. Postsurgical changes are again noted. Lungs are well aerated bilaterally. The bibasilar infiltrative changes have improved significantly in the interval. Persistent interstitial edema is noted. No acute bony abnormality is noted. IMPRESSION: Improved aeration bilaterally with evidence of persistent interstitial edema. Electronically Signed   By: Inez Catalina M.D.   On: 07/01/2016 08:04    Assessment & Plan:   CML CBC showed hematological remission I have ordered molecular studies to be done this morning. I will reschedule appointment to see him in the outpatient in 4 weeks. For now, I recommend holding Sprycel  Anemia of chronic illness He has received 2 units of blood transfusion with improvement of his symptoms I will continue close follow-up in the outpatient clinic  COPD exacerbation with pneumonia I would defer to primary service for management  Congestive heart failure, demand ischemia Recommend future transfusion to keep hemoglobin greater than 8  Discharge planning Will defer to primary service. I will sign off.  I will set up return appointment to see me in the office next month.  Heath Lark, MD 07/02/2016  12:15 PM

## 2016-07-02 NOTE — Progress Notes (Signed)
Pt declined cpap tonight.  Pt was advised that RT is available all night should he change his mind. ?

## 2016-07-02 NOTE — Consult Note (Signed)
   [facility] CM Inpatient Consult   07/02/2016  Eric [name] 1926/09/09 [id number]  Assess for re-admission in [facility]. Patient is recently  active with [facility] for chronic disease management services.  Patient has been engaged by a [facility] NP, Kayleen Memos. Made aware of this patient's admission.  Chart review reveals the patient is Eric [name] is a 83 y.o. male Per EMS pt is from an independent living facility with complaints of SOB 06/30/15  that has increased  Hx of CHF and COPD. Pt wears 3L oxygen at home at all times. Pt uses walker at home.  Patient was evaluated and noted to be hypoxic and chest x-ray reveals congestive heart failure with superimposed pneumonia. Active consent on file.  Patient is new to [facility] and assessed for re-admission.   Our community based plan of care has focused on disease management and community resource support.  Patient will receive a post discharge transition of care call and will be evaluated for monthly home visits for assessments and disease process education.  Will follow for disposition and needs.  [facility] Care Management services does not replace or interfere with any services that are needed or arranged by inpatient case management or social work.  For additional questions or needs please contact:  Natividad Brood, RN BSN [facility] Liaison  [phone] business mobile phone Toll free office [phone]

## 2016-07-02 NOTE — Progress Notes (Signed)
Physical Therapy Treatment Patient Details Name: Eric [name] MRN: [id number] DOB: [birth year] Today's Date: 07/02/2016    History of Present Illness Eric [name] is a 83 y.o. male Per EMS pt is from an independent living facility with complaints of SOB 06/30/15  that has increased  Hx of CHF and COPD. Pt wears 3L oxygen at home at all times. Pt uses walker at home.  Patient was evaluated and noted to be hypoxic and chest x-ray reveals congestive heart failure with superimposed pneumonia    PT Comments    Pt OOB in recliner on 2 lts nasal.  Pt stated, "I'm usually on 3" at home.  sats good at rest 94%.  Did increase to 3 lts for activity.  Assisted with amb twice a greater distance on 3 lts tolerated 75 feet twice with a RW with avg sats at 91% but increased c/o SOB.    Follow Up Recommendations  SNF;Home health PT;Supervision/Assistance - 24 hour (pending pt )     Equipment Recommendations  None recommended by PT    Recommendations for Other Services       Precautions / Restrictions Precautions Precautions: Fall Precaution Comments: on Home oxygen, colostomy, hernia Restrictions Weight Bearing Restrictions: No    Mobility  Bed Mobility               General bed mobility comments: OOB in recliner  Transfers Overall transfer level: Needs assistance Equipment used: Rolling walker (2 wheeled) Transfers: Sit to/from Stand Sit to Stand: Min assist         General transfer comment: 25% VC's on proper hand placement prior to sit  Ambulation/Gait Ambulation/Gait assistance: Min assist;Min guard Ambulation Distance (Feet): 150 Feet (75 feet x 2 one sitting rest break ) Assistive device: Rolling walker (2 wheeled) Gait Pattern/deviations: Step-through pattern;Staggering left;Staggering right Gait velocity: WFL   General Gait Details: at times unbalanced, steady assist . On 3 liters , sats >89%, HR95   Stairs            Wheelchair Mobility     Modified Rankin (Stroke Patients Only)       Balance                                    Cognition Arousal/Alertness: Awake/alert Behavior During Therapy: WFL for tasks assessed/performed Overall Cognitive Status: Within Functional Limits for tasks assessed                      Exercises      General Comments        Pertinent Vitals/Pain Pain Assessment: No/denies pain    Home Living                      Prior Function            PT Goals (current goals can now be found in the care plan section) Progress towards PT goals: Progressing toward goals    Frequency    Min 3X/week      PT Plan Current plan remains appropriate    Co-evaluation             End of Session Equipment Utilized During Treatment: Gait belt;Oxygen Activity Tolerance: Patient tolerated treatment well Patient left: in chair;with call bell/phone within reach;with chair alarm set     Time: [id number] PT Time Calculation (min) (ACUTE ONLY): 34 min  Charges:  $Gait Training: 8-22 mins $Therapeutic Activity: 8-22 mins                    G Codes:      Rica Koyanagi  PTA [facility]  Acute  Rehab Pager      [phone]

## 2016-07-02 NOTE — Evaluation (Signed)
Clinical/Bedside Swallow Evaluation Patient Details  Name: Eric [name] MRN: [id number] Date of Birth: 01-21-1927  Today's Date: 07/02/2016 Time: SLP Start Time (ACUTE ONLY): 1450 SLP Stop Time (ACUTE ONLY): 1500 SLP Time Calculation (min) (ACUTE ONLY): 10 min  Past Medical History:  Past Medical History:  Diagnosis Date  . ALLERGIC RHINITIS   . ANEMIA-NOS   . AORTIC SCLEROSIS   . Asthma   . CARDIOMYOPATHY, ISCHEMIC   . CAROTID BRUIT, RIGHT 02/27/2008  . Cataract    surgery  . CML (chronic myeloid leukemia) ([place]) 06/26/2015  . COPD   . CORONARY ARTERY DISEASE    a. s/p CABG in 1995 b. DES in 2008, 2009, and most recent in 2012 with DES to SVG-OM  . DIABETES MELLITUS-TYPE II    diet controlled  . Diastolic dysfunction, Grade 1 11/24/2014  . Diverticulitis of colon with perforation 11/22/2014  . Diverticulosis   . GERD   . HIATAL HERNIA   . Hx of echocardiogram    Echo (9/15):  Mild LVH, EF 50-55%, no RWMA, Gr 1 DD, MAC, mild LAE.  [name] HYPERLIPIDEMIA   . HYPERTENSION   . Hyponatremia 11/22/2014  . IBS (irritable bowel syndrome)   . LACTOSE INTOLERANCE   . OA (osteoarthritis)   . OBESITY   . Partial small bowel obstruction   . PERIPHERAL VASCULAR DISEASE   . Primary hyperparathyroidism ([place])    Lab Results Component Value Date  PTH 150.7* 02/13/2013  CALCIUM 11.0* 02/13/2013  CAION 1.21 03/15/2008    . Prostate cancer ([place])    seed implants 2004  . SICK SINUS/ TACHY-BRADY SYNDROME 09/2007   s/p PPM st jud[employer]  . Sleep apnea   . SMALL BOWEL OBSTRUCTION 04/18/2009   Qualifier: History of  By: Asa Lente MD, Jannifer Rodney Symptomatic diverticulosis 01/18/2009   Qualifier: Diagnosis of  By: Shane Crutch, Amy S    Past Surgical History:  Past Surgical History:  Procedure Laterality Date  . BACK SURGERY    . Bilateral cataracts    . COLON RESECTION N/A 11/28/2014   Procedure: EXPLORATORY LAPAROTOMY, SIGMOID COLECTOMY WITH COLOSTOMY;  Surgeon: Jackolyn Confer, MD;  Location: [facility]  ORS;  Service: General;  Laterality: N/A;  . COLON SURGERY    . COLONOSCOPY    . CORONARY ARTERY BYPASS GRAFT    . ESOPHAGOGASTRODUODENOSCOPY  multiple  . FLEXIBLE SIGMOIDOSCOPY N/A 09/22/2013   Procedure: FLEXIBLE SIGMOIDOSCOPY;  Surgeon: Gatha Mayer, MD;  Location: [facility] ENDOSCOPY;  Service: Endoscopy;  Laterality: N/A;  . INGUINAL HERNIA REPAIR Bilateral   . LUMBAR [place] [facility] SURGERY  12/2008  . PACEMAKER INSERTION     DDD/[employer]         Last interrogation 2/13  on chart     Pacemaker guideline order Dr Tamala Julian on chart  . Partial small bowel obstruction  2009  . PENILE PROSTHESIS PLACEMENT    . PTCA  2008, 2009, 2012   with DES  . TOTAL HIP ARTHROPLASTY  08/21/2011   Procedure: TOTAL HIP ARTHROPLASTY;  Surgeon: Johnn Hai, MD;  Location: [facility] ORS;  Service: Orthopedics;  Laterality: Right;   HPI:  83 y.o. male admitted with acute on chronic respiratory failure with hypoxia, PNA, COPD, GERD, OSA. CXR 1/22 pleural effusions bilaterally, CHF. Pt evaluated on 07/02/15, no overt signs of aspiration but high risk due to COPD. Pt refused MBS. MD reordered due to ongoing complaint of difficulty swallowing from patient.    Assessment / Plan / Recommendation  Clinical Impression  Pt demonstrates minimal volitional throat clearing due to pt report of mucous in his throat. Discussed with pt that it is possible he is feeling left over liquids in his throat. He seemed more concerned about swallowing than in prior assessment and is willing to participate in MBS to objectively evaluate swallow function. Will plan to f/u with this today.     Aspiration Risk  Mild aspiration risk    Diet Recommendation Regular;Thin liquid   Medication Administration: Whole meds with liquid Supervision: Patient able to self feed Compensations: Minimize environmental distractions;Slow rate;Small sips/bites    Other  Recommendations Oral Care Recommendations: Oral care BID   Follow up Recommendations Skilled Nursing  facility      Frequency and Duration            Prognosis        Swallow Study   General HPI: 83 y.o. male admitted with acute on chronic respiratory failure with hypoxia, PNA, COPD, GERD, OSA. CXR 1/22 pleural effusions bilaterally, CHF. Pt evaluated on 07/02/15, no overt signs of aspiration but high risk due to COPD. Pt refused MBS. MD reordered due to ongoing complaint of difficulty swallowing from patient.  Type of Study: Bedside Swallow Evaluation Previous Swallow Assessment: see HPI Diet Prior to this Study: Regular;Thin liquids Temperature Spikes Noted: No Respiratory Status: Nasal cannula History of Recent Intubation: No Behavior/Cognition: Alert;Cooperative Oral Care Completed by SLP: No Oral Cavity - Dentition: Dentures, bottom;Dentures, top Vision: Functional for self-feeding Self-Feeding Abilities: Able to feed self Baseline Vocal Quality: Normal Volitional Cough: Strong Volitional Swallow: Able to elicit    Oral/Motor/Sensory Function     Ice Chips     Thin Liquid Thin Liquid: Impaired Presentation: Straw Pharyngeal  Phase Impairments: Throat Clearing - Delayed    Nectar Thick Nectar Thick Liquid: Not tested   Honey Thick Honey Thick Liquid: Not tested   Puree Puree: Not tested   Solid   GO   Solid: Not tested       [name], MA CCC-SLP [phone]  [name], Katherene Ponto 07/02/2016,3:11 PM

## 2016-07-02 NOTE — Progress Notes (Signed)
Patient Demographics:    Eric [name], is a 83 y.o. male, DOB - [birth year], [id number]  Admit date - 06/29/2016   Admitting Physician Murlean Iba, MD  Outpatient Primary MD for the patient is [name], MD  LOS - 3   Chief Complaint  Patient presents with  . Shortness of Breath      Brief Narrative:  83 y.o.male from an independent living facility w/ chronic CHF and COPD admitted with complaints of SOB.  Pt wears 3L oxygen at home at all times and uses a walker. Pt had an appointment with his pulmonologist but was unable to go because of no transportation. EMS administered 5 mg albuterol and 0.5 mg atrovent after noting rales, rhonchi, and wheezing. He reported he had been without his Lasix for some time.    Subjective:   Eric [name] today has no fevers, no emesis,  No chest pain. Buit he is complaining of coughing while swallowing, also adamant that he wants to go to [facility], per daughter at bedside, any SNF as far as it is not [place] [facility].   Assessment  & Plan :    Active Problems:   Type 2 diabetes mellitus, controlled (HCC)   Hypertensive heart disease with CHF (HCC)   Coronary atherosclerosis   COPD mixed type (HCC)   Obstructive sleep apnea   Generalized weakness   GERD (gastroesophageal reflux disease)   Hyponatremia   Chronic diastolic heart failure (HCC)   CAD (coronary artery disease) of artery bypass graft   HCAP (healthcare-associated pneumonia)   CML (chronic myeloid leukemia) ([place])   Essential hypertension   Anemia in neoplastic disease   Acute respiratory failure with hypoxia (HCC)   Pneumonia  Acute on chronic respiratory failure with hypoxia secondary to pneumonia and COPD - improved, on 2-3 L at home at baseline - Sputum culture- Stenotrophomonas, sensitivities- resistant to Bactrim, sensitive to levaquin. Difficult to tell colonization vs  etiology for HCAP. But patient chest xray shows improvement in infiltrates. - Phone consult with ID today, recommended we treat with levaquin, may have different reaction/tolerate levaquin better, also he might be colonized considering hx of CMl on treatment, COPD. - B/c NGTD day 1 - Cefepime started 1/23- 1/25, and start Levaquin at lower dose of 500mg  daily, noted allergy to cipro, as arthralgias,  talked to pharmacy, monitor closely.   - stop Vanc as MRSA screen negative and pt clinically stabilizing  - Swallow eval, with c/o coughing with PO intake  Acute exacerbation of diastolic congestive heart failure- 05/19/16- Ef- 55-65%, G1 DD. BNP- 123. - Appreciate cards eval - Cardiology following consultation - now on PO lasix, started on metop 25mg  BID, and cont irbesartan.  - obtain daily weights - net negative ~1.2L thus far   Normocytic anemia - Hx of CML, WBC- 8.5 07/02/16 - Appreciate hem/onc eval - S/p 2 unit transfusion, appropriate response- Hgb 7-- >9.7, - F/u with Dr. Alvy Bimler in 1 month - For now, I recommend holding Sprycel , recommend Hgb >8.  Generalized weakness acutely worsening over the past 2 weeks likely secondary to respiratory failure - goal is for a SNF rehab stay following this admit. Adamantt that he goes to [facility], otherwise he says he will go home.  CAD s/p CABG and multiple PCIs Troponin mildly elevated to 0.03 in the setting of demand ischemia - no further evaluation planned per cardiology  HTN Blood pressure reasonably controlled at present - Started on metop 25mg  BId by Cards  Hyponatremia- 131 Felt to be due to volume overload in setting of CHF - follow with ongoing diuresis  OSA continue nightly CPAP therapy, though pt refuses frequently   GERD protonix ordered  Situational anxiety Trial of low dose short term klonopin as well as QHS seroquel - follow   Code Status : full   Disposition Plan  : SNF  Consults  :  Cardiology,  Oncology.   DVT Prophylaxis  :  Lovenox - Heparin - SCDs  Lab Results  Component Value Date   PLT 330 07/02/2016    Inpatient Medications  Scheduled Meds: . sodium chloride   Intravenous Once  . aspirin EC  81 mg Oral QPM  . feeding supplement (ENSURE ENLIVE)  237 mL Oral BID BM  . furosemide  40 mg Oral BID  . heparin  5,000 Units Subcutaneous Q8H  . insulin aspart  0-15 Units Subcutaneous TID WC  . insulin aspart  0-5 Units Subcutaneous QHS  . insulin aspart  4 Units Subcutaneous TID WC  . ipratropium-albuterol  3 mL Nebulization QID  . irbesartan  150 mg Oral BID  . levofloxacin  500 mg Oral Daily  . metoprolol tartrate  25 mg Oral BID  . pantoprazole  40 mg Oral Daily  . pravastatin  60 mg Oral QHS   Continuous Infusions: PRN Meds:.acetaminophen, albuterol, clonazePAM, guaiFENesin-dextromethorphan, HYDROcodone-acetaminophen, ondansetron (ZOFRAN) IV, polyvinyl alcohol    Anti-infectives    Start     Dose/Rate Route Frequency Ordered Stop   07/02/16 1430  levofloxacin (LEVAQUIN) tablet 500 mg     500 mg Oral Daily 07/02/16 1421     06/30/16 0600  vancomycin (VANCOCIN) IVPB 750 mg/150 ml premix  Status:  Discontinued     750 mg 150 mL/hr over 60 Minutes Intravenous Every 12 hours 06/29/16 1726 07/01/16 1550   06/30/16 0600  ceFEPIme (MAXIPIME) 1 g in dextrose 5 % 50 mL IVPB  Status:  Discontinued     1 g 100 mL/hr over 30 Minutes Intravenous Every 8 hours 06/29/16 2148 07/02/16 1421   06/29/16 1800  vancomycin (VANCOCIN) IVPB 1000 mg/200 mL premix     1,000 mg 200 mL/hr over 60 Minutes Intravenous NOW 06/29/16 1726 06/29/16 2210   06/29/16 1700  ceFEPIme (MAXIPIME) 1 g in dextrose 5 % 50 mL IVPB     1 g 100 mL/hr over 30 Minutes Intravenous  Once 06/29/16 1651 06/29/16 2021        Objective:   Vitals:   07/02/16 0825 07/02/16 0915 07/02/16 1333 07/02/16 1340  BP:    129/63  Pulse:    75  Resp:    (!) 22  Temp:    98.2 F (36.8 C)  TempSrc:    Oral  SpO2:  95%  95% 96%  Weight:  96.3 kg (212 lb 4.9 oz)      Wt Readings from Last 3 Encounters:  07/02/16 96.3 kg (212 lb 4.9 oz)  06/23/16 97.1 kg (214 lb)  06/09/16 103.2 kg (227 lb 8 oz)     Intake/Output Summary (Last 24 hours) at 07/02/16 1424 Last data filed at 07/02/16 0900  Gross per 24 hour  Intake             1520 ml  Output             1900 ml  Net             -380 ml    Physical Exam Gen:- Awake Alert, oriented, daughter at bedside, on 2l by nasal canula HEENT:- [place].AT, No sclera icterus Neck-Supple Neck,No JVD,.  Lungs-  Mild diffuse rhonchi, no crackles appreciAted. CV- S1, S2 normal Abd-  +ve B.Sounds, Abd Soft, No tenderness, large abdominal hernia, with colostomy tube left side of abd    Extremity/Skin:- trace  edema,  warm    Data Review:   Micro Results Recent Results (from the past 240 hour(s))  Culture, sputum-assessment     Status: None   Collection Time: 06/29/16  9:40 PM  Result Value Ref Range Status   Specimen Description SPUTUM  Final   Special Requests NONE  Final   Sputum evaluation THIS SPECIMEN IS ACCEPTABLE FOR SPUTUM CULTURE  Final   Report Status 06/29/2016 FINAL  Final  Culture, respiratory (NON-Expectorated)     Status: None   Collection Time: 06/29/16  9:40 PM  Result Value Ref Range Status   Specimen Description SPUTUM  Final   Special Requests NONE Reflexed from [id number]  Final   Gram Stain   Final    RARE WBC PRESENT, PREDOMINANTLY PMN MODERATE GRAM NEGATIVE RODS RARE GRAM POSITIVE COCCI IN PAIRS Performed at [facility], [place] [address] [place], [place] [address]    Culture ABUNDANT STENOTROPHOMONAS MALTOPHILIA  Final   Report Status 07/02/2016 FINAL  Final   Organism ID, Bacteria STENOTROPHOMONAS MALTOPHILIA  Final      Susceptibility   Stenotrophomonas maltophilia - MIC*    LEVOFLOXACIN 1 SENSITIVE Sensitive     TRIMETH/SULFA >=320 RESISTANT Resistant     * ABUNDANT STENOTROPHOMONAS MALTOPHILIA  Culture, blood (routine  x 2) Call MD if unable to obtain prior to antibiotics being given     Status: None (Preliminary result)   Collection Time: 06/29/16 10:37 PM  Result Value Ref Range Status   Specimen Description BLOOD LEFT HAND  Final   Special Requests IN PEDIATRIC BOTTLE 4ML  Final   Culture   Final    NO GROWTH 1 DAY Performed at [facility], [place] [address] [place], [place] [address]    Report Status PENDING  Incomplete  Culture, blood (routine x 2) Call MD if unable to obtain prior to antibiotics being given     Status: None (Preliminary result)   Collection Time: 06/29/16 10:37 PM  Result Value Ref Range Status   Specimen Description BLOOD LEFT ANTECUBITAL  Final   Special Requests BOTTLES DRAWN AEROBIC AND ANAEROBIC 7.5ML  Final   Culture   Final    NO GROWTH 1 DAY Performed at [facility], [place] [address] [place], [place] [address]    Report Status PENDING  Incomplete  MRSA PCR Screening     Status: None   Collection Time: 06/30/16  6:35 AM  Result Value Ref Range Status   MRSA by PCR NEGATIVE NEGATIVE Final    Comment:        The GeneXpert MRSA Assay (FDA approved for NASAL specimens only), is one component of a comprehensive MRSA colonization surveillance program. It is not intended to diagnose MRSA infection nor to guide or monitor treatment for MRSA infections. Performed at [facility], [place] [address], [place],  [address]     Radiology Reports Dg Chest 2 View  Result  Date: 06/29/2016 CLINICAL DATA:  Shortness of Breath.  History of prostate carcinoma EXAM: CHEST  2 VIEW COMPARISON:  Chest radiograph June 02, 2016 and chest CT December 2017 2017. FINDINGS: There is cardiomegaly with pulmonary venous hypertension. Pacemaker leads are attached to the right atrium and right ventricle. Patient is status post coronary artery bypass grafting. There are pleural effusions bilaterally. There is patchy interstitial and alveolar edema in both mid and lower lung  zones. There is aortic atherosclerosis. There is degenerative change in the thoracic spine. No adenopathy. No blastic or lytic bone lesions. IMPRESSION: Findings consistent with a degree of congestive heart failure. Superimposed bibasilar pneumonia cannot be excluded radiographically. Electronically Signed   By: Lowella Grip III M.D.   On: 06/29/2016 15:13   Ct Angio Chest Pe W Or Wo Contrast  Result Date: 06/03/2016 CLINICAL DATA:  Cough and shortness of breath for weeks. EXAM: CT ANGIOGRAPHY CHEST WITH CONTRAST TECHNIQUE: Multidetector CT imaging of the chest was performed using the standard protocol during bolus administration of intravenous contrast. Multiplanar CT image reconstructions and MIPs were obtained to evaluate the vascular anatomy. CONTRAST:  73 mL of Isovue 370 intravenous contrast COMPARISON:  Chest radiograph, 06/02/2016 FINDINGS: Cardiovascular: There is satisfactory opacification of the pulmonary arteries. However, the study is limited by respiratory motion which limits evaluation of the segmental and subsegmental pulmonary arteries, particularly in the lower lobes. Allowing for this limitation, there is no evidence of a pulmonary embolus. The great vessels are normal in caliber. Atherosclerotic calcifications are noted along the thoracic aorta. No dissection. There are dense coronary artery calcifications. The heart is mildly enlarged. There changes from previous CABG surgery. Mediastinum/Nodes: No neck base, axillary, mediastinal or hilar masses or adenopathy. Lungs/Pleura: There is bilateral lower lobe bronchial wall thickening with peribronchovascular airspace opacity, most confluent in the posterior lung bases. Milder opacity lies at the bases of the right middle lobe and left upper lobe lingula, consistent with atelectasis. Mild paraseptal and centrilobular emphysema is noted in the upper lobes. There is no convincing pulmonary edema. No significant pleural effusions. Upper  Abdomen: Low-density liver lesions consistent with cysts. Mild bilateral renal cortical thinning. Partial fatty replacement of the pancreas. No acute findings. Musculoskeletal: No fracture or acute finding. No osteoblastic or osteolytic lesions. Degenerative changes noted throughout the visualized spine. Bones are diffusely demineralized. Review of the MIP images confirms the above findings. IMPRESSION: 1. Study mildly limited by respiratory motion. Allowing for this, there is no evidence of a pulmonary embolus. 2. Lower lobe bronchial wall thickening with associated patchy peribronchovascular opacity and more confluent opacity at the posterior lung bases. Suspect a combination of bronchitis and/or bronchopneumonia and atelectasis. No evidence of pulmonary edema. 3. Mild cardiomegaly, changes from previous CABG surgery and dense coronary artery calcifications. Electronically Signed   By: Lajean Manes M.D.   On: 06/03/2016 12:20   Dg Chest Port 1 View  Result Date: 07/01/2016 CLINICAL DATA:  Shortness of breath and wheezing EXAM: PORTABLE CHEST 1 VIEW COMPARISON:  06/29/2016 FINDINGS: Cardiac shadow is mildly enlarged but stable. Pacing device is again seen and stable. Postsurgical changes are again noted. Lungs are well aerated bilaterally. The bibasilar infiltrative changes have improved significantly in the interval. Persistent interstitial edema is noted. No acute bony abnormality is noted. IMPRESSION: Improved aeration bilaterally with evidence of persistent interstitial edema. Electronically Signed   By: Inez Catalina M.D.   On: 07/01/2016 08:04     CBC  Recent Labs Lab 06/29/16 1435 06/29/16 2237  06/30/16 0552 07/01/16 0625 07/02/16 0543  WBC 5.0 5.2 3.5* 9.8 8.5  HGB 7.5* 7.3* 7.2* 7.0* 9.7*  HCT 23.0* 22.4* 22.5* 21.5* 29.3*  PLT 250 266 265 290 330  MCV 96.6 96.6 94.1 95.6 93.0  MCH 31.5 31.5 30.1 31.1 30.8  MCHC 32.6 32.6 32.0 32.6 33.1  RDW 16.3* 16.2* 15.9* 15.9* 18.5*  LYMPHSABS  0.5*  --  0.1* 0.8  --   MONOABS 0.7  --  0.1 0.9  --   EOSABS 0.0  --  0.0 0.0  --   BASOSABS 0.0  --  0.0 0.0  --     Chemistries   Recent Labs Lab 06/29/16 1435 06/29/16 2237 06/30/16 0552 07/01/16 0625 07/02/16 0543  NA 131*  --  131* 130* 135  K 3.9  --  4.8 4.4 4.2  CL 98*  --  100* 99* 99*  CO2 21*  --  21* 21* 25  GLUCOSE 135*  --  215* 184* 110*  BUN 20  --  19 33* 37*  CREATININE 1.02 1.08 1.05 1.03 1.16  CALCIUM 8.9  --  8.9 9.2 9.4  MG  --   --  2.5* 2.3  --   AST  --   --   --   --  39  ALT  --   --   --   --  31  ALKPHOS  --   --   --   --  62  BILITOT  --   --   --   --  0.7   ------------------------------------------------------------------------------------------------------------------ No results for input(s): CHOL, HDL, LDLCALC, TRIG, CHOLHDL, LDLDIRECT in the last 72 hours.  Lab Results  Component Value Date   HGBA1C 5.8 (H) 05/20/2016   ------------------------------------------------------------------------------------------------------------------ No results for input(s): TSH, T4TOTAL, T3FREE, THYROIDAB in the last 72 hours.  Invalid input(s): FREET3 ------------------------------------------------------------------------------------------------------------------ No results for input(s): VITAMINB12, FOLATE, FERRITIN, TIBC, IRON, RETICCTPCT in the last 72 hours.  Coagulation profile No results for input(s): INR, PROTIME in the last 168 hours.  No results for input(s): DDIMER in the last 72 hours.  Cardiac Enzymes  Recent Labs Lab 06/30/16 0552 06/30/16 1011 06/30/16 1543  TROPONINI 0.03* 0.03* 0.03*   ------------------------------------------------------------------------------------------------------------------    Component Value Date/Time   BNP 223.0 (H) 06/29/2016 1435     Ronav Furney E Marielis Samara M.D on 07/02/2016 at 2:24 PM  Between 7am to 7pm - Pager - [phone]  After 7pm go to [email or web address] - password [email or web address]  [facility] -  Office  [phone]  Dragon dictation system was used to create this note, attempts have been made to correct errors, however presence of uncorrected errors is not a reflection quality of care provided

## 2016-07-03 DIAGNOSIS — I10 Essential (primary) hypertension: Secondary | ICD-10-CM | POA: Diagnosis not present

## 2016-07-03 DIAGNOSIS — Z8546 Personal history of malignant neoplasm of prostate: Secondary | ICD-10-CM | POA: Diagnosis not present

## 2016-07-03 DIAGNOSIS — J441 Chronic obstructive pulmonary disease with (acute) exacerbation: Secondary | ICD-10-CM | POA: Diagnosis not present

## 2016-07-03 DIAGNOSIS — C9210 Chronic myeloid leukemia, BCR/ABL-positive, not having achieved remission: Secondary | ICD-10-CM | POA: Diagnosis not present

## 2016-07-03 DIAGNOSIS — J449 Chronic obstructive pulmonary disease, unspecified: Secondary | ICD-10-CM | POA: Diagnosis not present

## 2016-07-03 DIAGNOSIS — C92Z0 Other myeloid leukemia not having achieved remission: Secondary | ICD-10-CM | POA: Diagnosis not present

## 2016-07-03 DIAGNOSIS — I5032 Chronic diastolic (congestive) heart failure: Secondary | ICD-10-CM | POA: Diagnosis not present

## 2016-07-03 DIAGNOSIS — G4733 Obstructive sleep apnea (adult) (pediatric): Secondary | ICD-10-CM | POA: Diagnosis not present

## 2016-07-03 DIAGNOSIS — E119 Type 2 diabetes mellitus without complications: Secondary | ICD-10-CM | POA: Diagnosis not present

## 2016-07-03 DIAGNOSIS — I5033 Acute on chronic diastolic (congestive) heart failure: Secondary | ICD-10-CM | POA: Diagnosis not present

## 2016-07-03 DIAGNOSIS — R1312 Dysphagia, oropharyngeal phase: Secondary | ICD-10-CM | POA: Diagnosis not present

## 2016-07-03 DIAGNOSIS — I2510 Atherosclerotic heart disease of native coronary artery without angina pectoris: Secondary | ICD-10-CM | POA: Diagnosis not present

## 2016-07-03 DIAGNOSIS — R2689 Other abnormalities of gait and mobility: Secondary | ICD-10-CM | POA: Diagnosis not present

## 2016-07-03 DIAGNOSIS — K219 Gastro-esophageal reflux disease without esophagitis: Secondary | ICD-10-CM | POA: Diagnosis not present

## 2016-07-03 DIAGNOSIS — R278 Other lack of coordination: Secondary | ICD-10-CM | POA: Diagnosis not present

## 2016-07-03 DIAGNOSIS — D630 Anemia in neoplastic disease: Secondary | ICD-10-CM | POA: Diagnosis not present

## 2016-07-03 DIAGNOSIS — J189 Pneumonia, unspecified organism: Secondary | ICD-10-CM | POA: Diagnosis not present

## 2016-07-03 DIAGNOSIS — J9601 Acute respiratory failure with hypoxia: Secondary | ICD-10-CM | POA: Diagnosis not present

## 2016-07-03 DIAGNOSIS — Z9981 Dependence on supplemental oxygen: Secondary | ICD-10-CM | POA: Diagnosis not present

## 2016-07-03 DIAGNOSIS — E785 Hyperlipidemia, unspecified: Secondary | ICD-10-CM | POA: Diagnosis not present

## 2016-07-03 DIAGNOSIS — Z950 Presence of cardiac pacemaker: Secondary | ICD-10-CM | POA: Diagnosis not present

## 2016-07-03 DIAGNOSIS — E784 Other hyperlipidemia: Secondary | ICD-10-CM | POA: Diagnosis not present

## 2016-07-03 DIAGNOSIS — D6489 Other specified anemias: Secondary | ICD-10-CM | POA: Diagnosis not present

## 2016-07-03 DIAGNOSIS — E871 Hypo-osmolality and hyponatremia: Secondary | ICD-10-CM | POA: Diagnosis not present

## 2016-07-03 DIAGNOSIS — J9600 Acute respiratory failure, unspecified whether with hypoxia or hypercapnia: Secondary | ICD-10-CM | POA: Diagnosis not present

## 2016-07-03 DIAGNOSIS — J9620 Acute and chronic respiratory failure, unspecified whether with hypoxia or hypercapnia: Secondary | ICD-10-CM | POA: Diagnosis not present

## 2016-07-03 DIAGNOSIS — R531 Weakness: Secondary | ICD-10-CM | POA: Diagnosis not present

## 2016-07-03 LAB — GLUCOSE, CAPILLARY
GLUCOSE-CAPILLARY: 136 mg/dL — AB (ref 65–99)
GLUCOSE-CAPILLARY: 136 mg/dL — AB (ref 65–99)
GLUCOSE-CAPILLARY: 122 mg/dL — AB (ref 65–99)
GLUCOSE-CAPILLARY: 152 mg/dL — AB (ref 65–99)
Glucose-Capillary: 91 mg/dL (ref 65–99)

## 2016-07-03 MED ORDER — LEVOFLOXACIN 500 MG PO TABS: 500.0000 mg | ORAL_TABLET | Freq: Every day | ORAL | 0 refills | Status: AC

## 2016-07-03 NOTE — Progress Notes (Signed)
Speech Language Pathology Treatment: Dysphagia  Patient Details Name: Eric [name] MRN: [id number] DOB: 04/11/1927 Today's Date: 07/03/2016 Time: 2256-7209 SLP Time Calculation (min) (ACUTE ONLY): 27 min  Assessment / Plan / Recommendation Clinical Impression  Pt demonstrates tolerance f current diet. Needs one verbal reminder to recall second swallow strategy, which he carried out during observation with positive reinforcement. Reiterated importance of oral care to reduce risk of pneumonia, which he completes independently. All education complete, no f/u needed.    HPI HPI: 83 y.o. male admitted with acute on chronic respiratory failure with hypoxia, PNA, COPD, GERD, OSA. CXR 1/22 pleural effusions bilaterally, CHF. Pt evaluated on 07/02/15, no overt signs of aspiration but high risk due to COPD. Pt refused MBS. MD reordered due to ongoing complaint of difficulty swallowing from patient.       SLP Plan  All goals met     Recommendations  Diet recommendations: Regular;Thin liquid Liquids provided via: Cup;No straw Medication Administration: Whole meds with liquid Supervision: Patient able to self feed Compensations: Slow rate;Small sips/bites;Multiple dry swallows after each bite/sip Postural Changes and/or Swallow Maneuvers: Seated upright 90 degrees                Oral Care Recommendations: Oral care BID Follow up Recommendations: Skilled Nursing facility Plan: All goals met       GO                Maan Zarcone, Katherene Ponto 07/03/2016, 11:52 AM

## 2016-07-03 NOTE — Discharge Summary (Signed)
Physician Discharge Summary  Eric [name] [id number] DOB: [birth year] DOA: 06/29/2016  PCP: [name], MD  Admit date: 06/29/2016 Discharge date: 07/03/2016  Admitted From: ILF Disposition:  SNF  Recommendations for Outpatient Follow-up:  1. Follow up with PCP in 1-2 weeks 2. Please obtain BMP/CBC in one week 3. Please follow up on the following pending results:  Home Health:NO Equipment/Devices: Oxygen  Discharge Condition:Stable CODE STATUS:Full Diet recommendation: Heart Healthy / Carb Modified, Consistency- Regular, thin liquids.  Brief/Interim Summary: Chief Complaint: SOB  HPI: Eric [name] is a 83 y.o. male Per EMS pt is from an independent living facility with complaints of SOB that has increased this morning. Hx of CHF and COPD. Pt wears 3L oxygen at home at all times. Pt uses walker at home. Pt had an appointment with his pulmonologist today but was unable to go because of no transportation. EMS 5 mg albuterol and 0.5 mg atrovent. EMS reports pt has rales, rhonchi, and wheezing. Pt was reporting that he was having significant shortness of breath with ambulation. His pulse ox drops in the low 80s with ambulation. He reports productive cough. He denies fever and chills. He reports that he has been without his Lasix for some time. He does report that his wheezing and coughing has progressively worsened over the past 2 weeks since being discharged from the hospital. He reports occasional chest pain associated with deep coughing.  ED course: Patient was evaluated and noted to be hypoxic and chest x-ray reveals congestive heart failure with superimposed pneumonia. The patient will be admitted for further evaluation and management.  Discharge Diagnoses:  Active Problems:   Type 2 diabetes mellitus, controlled (HCC)   Hypertensive heart disease with CHF (HCC)   Coronary atherosclerosis   COPD mixed type (HCC)   Obstructive sleep apnea   Generalized  weakness   GERD (gastroesophageal reflux disease)   Hyponatremia   Chronic diastolic heart failure (HCC)   CAD (coronary artery disease) of artery bypass graft   HCAP (healthcare-associated pneumonia)   CML (chronic myeloid leukemia) ([place])   Essential hypertension   Anemia in neoplastic disease   Acute respiratory failure with hypoxia (HCC)   Pneumonia  Acute on chronic respiratory failure with hypoxia secondary to pneumonia and COPD- Chest xray- bibasilar PNA, with congestive heart failure, WBC- 5.  - Cefepime given 1/23- 1/25 for Hospital acquired PNA coverage, and start Levaquin at lower dose of 500mg  daily, as recommended by pharmacy, considering renal function, age and cipro allergy.  - Sputum culture- Stenotrophomonas, resistant to Bactrim, sensitive to levaquin.  - Phone consult with ID today, recommended we treat with levaquin for total of 14 days, pt to complete 12 more days on discharge. Reported allergy to cipro- arthralgia, so concern for similar reaction with levaquin, especillay tendon rupture considering aage. But choice for antibiotic coverage limited due to sensitivties. He might be colonized considering hx of CMl on treatment, COPD, but with initial fever- documented by PCP, might be the etiology. - 06/29/16 Blood cultures- NGTD, final result pending.  - stopped Vanc as MRSA screen negative - Swallow eval- By speech therapy and subsequent barium swallow. Diet recommendation- regular, thin liquids  - Discharged on home 2-3l of O2.  Acute exacerbation of diastolic congestive heart failure- 05/19/16- Ef- 55-65%, G1 DD. BNP- 123. - Cardiology following consultation - now on PO lasix, started on metop 25mg  BID, and cont irbesartan.   Normocytic anemia - Hx of CML, WBC- 8.5 07/02/16 - Appreciate hem/onc eval,  follow up with Dr. Alvy Bimler in 4 weeks, she also recommended holding the Sprycel. So this was held on admission and on discharge. - S/p 2 unit transfusion, appropriate  response- Hgb 7-- >9.7  Generalized weakness- Improved, discharge to rehab.  CAD s/p CABG and multiple PCIs Troponin mildly elevated to 0.03 in the setting of demand ischemia - no further evaluation planned per cardiology  HTN Blood pressure reasonably controlled at present - Started on metop 25mg  BId by Cards, cont ARB.  Mild Hyponatremia- 135 on discharge. From 131 on admission.  Felt to be due to volume overload in setting of CHF - follow with ongoing diuresis  OSA continue nightly CPAP therapy, though pt refuses frequently   GERD protonix ordered  Situational anxiety Discharge Instructions  Discharge Instructions    Diet - low sodium heart healthy    Complete by:  As directed    Discharge instructions    Complete by:  As directed    Complete 12 more days of Levaquin.   Increase activity slowly    Complete by:  As directed      Allergies as of 07/03/2016      Reactions   Actos [pioglitazone Hydrochloride] Other (See Comments)   "felt funny, drowsy, and weak":   Buprenorphine Hcl Nausea And Vomiting   Celebrex [celecoxib] Other (See Comments)   "felt funny"   Demerol Palpitations, Other (See Comments)   Increased BP   Meperidine Palpitations   Other reaction(s): Other (See Comments) Increased BP   Morphine And Related Nausea And Vomiting   Ciprofloxacin Other (See Comments)   arthralgia   Metformin Nausea And Vomiting   Zocor [simvastatin] Other (See Comments)   Makes pt very drowsy      Medication List    STOP taking these medications   dasatinib 70 MG tablet Commonly known as:  SPRYCEL     TAKE these medications   acetaminophen 500 MG tablet Commonly known as:  TYLENOL Take 1,000 mg by mouth every 6 (six) hours as needed for mild pain or fever.   albuterol (2.5 MG/3ML) 0.083% nebulizer solution Commonly known as:  PROVENTIL USE 1 VIAL WITH NEBULIZER EVERY 4 HOURS AS NEEDED FOR WHEEZING   albuterol 108 (90 Base) MCG/ACT inhaler Commonly  known as:  PROVENTIL HFA Inhale 2 puffs into the lungs every 4 (four) hours as needed for shortness of breath. Wheezing   albuterol-ipratropium 18-103 MCG/ACT inhaler Commonly known as:  COMBIVENT Inhale 1 puff into the lungs every 6 (six) hours as needed for wheezing or shortness of breath.   aspirin 81 MG tablet Take 81 mg by mouth every evening.   feeding supplement (ENSURE ENLIVE) Liqd Take 237 mLs by mouth 2 (two) times daily between meals.   fluticasone 50 MCG/ACT nasal spray Commonly known as:  FLONASE Place 2 sprays into both nostrils daily. Allergies What changed:  when to take this  reasons to take this  additional instructions   furosemide 40 MG tablet Commonly known as:  LASIX Take 1 tablet (40 mg total) by mouth daily.   guaiFENesin-dextromethorphan 100-10 MG/5ML syrup Commonly known as:  ROBITUSSIN DM Take 5 mLs by mouth every 4 (four) hours as needed for cough.   HYDROcodone-acetaminophen 7.5-325 MG tablet Commonly known as:  NORCO Take 1 tablet by mouth every 6 (six) hours as needed for moderate pain.   levofloxacin 500 MG tablet Commonly known as:  LEVAQUIN Take 1 tablet (500 mg total) by mouth daily. Start taking on:  07/04/2016  nitroGLYCERIN 0.4 MG SL tablet Commonly known as:  NITROSTAT Place 0.4 mg under the tongue every 5 (five) minutes as needed for chest pain. Reported on 12/12/2015   pravastatin 20 MG tablet Commonly known as:  PRAVACHOL Take 3 tablets (60 mg total) by mouth daily.   ranitidine 300 MG tablet Commonly known as:  ZANTAC Take 300 mg by mouth at bedtime.   SYSTANE 0.4-0.3 % Soln Generic drug:  Polyethyl Glycol-Propyl Glycol Apply 2 drops to eye 3 (three) times daily as needed (dry eyes).   triamcinolone cream 0.1 % Commonly known as:  KENALOG Apply 1 application topically 3 (three) times daily as needed (dry skin.).   valsartan 160 MG tablet Commonly known as:  DIOVAN Take 1 tablet (160 mg total) by mouth daily. What  changed:  when to take this      Contact information for after-discharge care    Destination    [facility] .   Specialty:  [place] information: [place] [phone]             Allergies  Allergen Reactions  . Actos [Pioglitazone Hydrochloride] Other (See Comments)    "felt funny, drowsy, and weak":  [name] Buprenorphine Hcl Nausea And Vomiting  . Celebrex [Celecoxib] Other (See Comments)    "felt funny"  . Demerol Palpitations and Other (See Comments)    Increased BP  . Meperidine Palpitations    Other reaction(s): Other (See Comments) Increased BP  . Morphine And Related Nausea And Vomiting  . Ciprofloxacin Other (See Comments)    arthralgia  . Metformin Nausea And Vomiting  . Zocor [Simvastatin] Other (See Comments)    Makes pt very drowsy    Consultations:  Cardiology   Procedures/Studies: Dg Chest 2 View  Result Date: 06/29/2016 CLINICAL DATA:  Shortness of Breath.  History of prostate carcinoma EXAM: CHEST  2 VIEW COMPARISON:  Chest radiograph June 02, 2016 and chest CT December 2017 2017. FINDINGS: There is cardiomegaly with pulmonary venous hypertension. Pacemaker leads are attached to the right atrium and right ventricle. Patient is status post coronary artery bypass grafting. There are pleural effusions bilaterally. There is patchy interstitial and alveolar edema in both mid and lower lung zones. There is aortic atherosclerosis. There is degenerative change in the thoracic spine. No adenopathy. No blastic or lytic bone lesions. IMPRESSION: Findings consistent with a degree of congestive heart failure. Superimposed bibasilar pneumonia cannot be excluded radiographically. Electronically Signed   By: Lowella Grip III M.D.   On: 06/29/2016 15:13   Dg Chest Port 1 View  Result Date: 07/01/2016 CLINICAL DATA:  Shortness of breath and wheezing EXAM: PORTABLE CHEST 1  VIEW COMPARISON:  06/29/2016 FINDINGS: Cardiac shadow is mildly enlarged but stable. Pacing device is again seen and stable. Postsurgical changes are again noted. Lungs are well aerated bilaterally. The bibasilar infiltrative changes have improved significantly in the interval. Persistent interstitial edema is noted. No acute bony abnormality is noted. IMPRESSION: Improved aeration bilaterally with evidence of persistent interstitial edema. Electronically Signed   By: Inez Catalina M.D.   On: 07/01/2016 08:04      Subjective: No complainst today. Ready to be discharged.   Discharge Exam: Vitals:   07/02/16 2050 07/03/16 0456  BP: (!) 122/58 137/69  Pulse: 82 79  Resp: 20 19  Temp: 98.5 F (36.9 C) 98.4 F (36.9 C)   Vitals:   07/02/16 2050 07/03/16 0456 07/03/16 0902 07/03/16 1259  BP: (!) 122/58 137/69    Pulse: 82 79    Resp: 20 19    Temp: 98.5 F (36.9 C) 98.4 F (36.9 C)    TempSrc: Oral Oral    SpO2: 98% 94% 96% 95%  Weight:        General: Pt is alert, awake, not in acute distress Cardiovascular: RRR, S1/S2 +, no rubs, no gallops Respiratory: CTA bilaterally, no wheezing, no rhonchi Abdominal: Soft, NT, ND, bowel sounds + Extremities: no edema, no cyanosis  The results of significant diagnostics from this hospitalization (including imaging, microbiology, ancillary and laboratory) are listed below for reference.     Microbiology: Recent Results (from the past 240 hour(s))  Culture, sputum-assessment     Status: None   Collection Time: 06/29/16  9:40 PM  Result Value Ref Range Status   Specimen Description SPUTUM  Final   Special Requests NONE  Final   Sputum evaluation THIS SPECIMEN IS ACCEPTABLE FOR SPUTUM CULTURE  Final   Report Status 06/29/2016 FINAL  Final  Culture, respiratory (NON-Expectorated)     Status: None   Collection Time: 06/29/16  9:40 PM  Result Value Ref Range Status   Specimen Description SPUTUM  Final   Special Requests NONE Reflexed from  [id number]  Final   Gram Stain   Final    RARE WBC PRESENT, PREDOMINANTLY PMN MODERATE GRAM NEGATIVE RODS RARE GRAM POSITIVE COCCI IN PAIRS Performed at [facility], [address], [place], [place] [address]    Culture ABUNDANT STENOTROPHOMONAS MALTOPHILIA  Final   Report Status 07/02/2016 FINAL  Final   Organism ID, Bacteria STENOTROPHOMONAS MALTOPHILIA  Final      Susceptibility   Stenotrophomonas maltophilia - MIC*    LEVOFLOXACIN 1 SENSITIVE Sensitive     TRIMETH/SULFA >=320 RESISTANT Resistant     * ABUNDANT STENOTROPHOMONAS MALTOPHILIA  Culture, blood (routine x 2) Call MD if unable to obtain prior to antibiotics being given     Status: None (Preliminary result)   Collection Time: 06/29/16 10:37 PM  Result Value Ref Range Status   Specimen Description BLOOD LEFT HAND  Final   Special Requests IN PEDIATRIC BOTTLE 4ML  Final   Culture   Final    NO GROWTH 3 DAYS Performed at [facility], [place] [address], [place], [place] [address]    Report Status PENDING  Incomplete  Culture, blood (routine x 2) Call MD if unable to obtain prior to antibiotics being given     Status: None (Preliminary result)   Collection Time: 06/29/16 10:37 PM  Result Value Ref Range Status   Specimen Description BLOOD LEFT ANTECUBITAL  Final   Special Requests BOTTLES DRAWN AEROBIC AND ANAEROBIC 7.5ML  Final   Culture   Final    NO GROWTH 3 DAYS Performed at [facility] [address], [place], [place] [address]    Report Status PENDING  Incomplete  MRSA PCR Screening     Status: None   Collection Time: 06/30/16  6:35 AM  Result Value Ref Range Status   MRSA by PCR NEGATIVE NEGATIVE Final    Comment:        The GeneXpert MRSA Assay (FDA approved for NASAL specimens only), is one component of a comprehensive MRSA colonization surveillance program. It is not intended to diagnose MRSA infection nor to guide or monitor treatment for MRSA infections. Performed at [facility], [place] [address], [place],  [address]  Labs: BNP (last 3 results)  Recent Labs  05/19/16 1001 06/02/16 0900 06/29/16 1435  BNP 86.6 112.4* [id number]   Basic Metabolic Panel:  Recent Labs Lab 06/29/16 1435 06/29/16 2237 06/30/16 0552 07/01/16 0625 07/02/16 0543  NA 131*  --  131* 130* 135  K 3.9  --  4.8 4.4 4.2  CL 98*  --  100* 99* 99*  CO2 21*  --  21* 21* 25  GLUCOSE 135*  --  215* 184* 110*  BUN 20  --  19 33* 37*  CREATININE 1.02 1.08 1.05 1.03 1.16  CALCIUM 8.9  --  8.9 9.2 9.4  MG  --   --  2.5* 2.3  --    Liver Function Tests:  Recent Labs Lab 07/02/16 0543  AST 39  ALT 31  ALKPHOS 62  BILITOT 0.7  PROT 7.4  ALBUMIN 3.5   No results for input(s): LIPASE, AMYLASE in the last 168 hours. No results for input(s): AMMONIA in the last 168 hours. CBC:  Recent Labs Lab 06/29/16 1435 06/29/16 2237 06/30/16 0552 07/01/16 0625 07/02/16 0543  WBC 5.0 5.2 3.5* 9.8 8.5  NEUTROABS 3.8  --  3.3 8.1*  --   HGB 7.5* 7.3* 7.2* 7.0* 9.7*  HCT 23.0* 22.4* 22.5* 21.5* 29.3*  MCV 96.6 96.6 94.1 95.6 93.0  PLT 250 266 265 290 330   Cardiac Enzymes:  Recent Labs Lab 06/29/16 2237 06/30/16 0552 06/30/16 1011 06/30/16 1543  TROPONINI 0.04* 0.03* 0.03* 0.03*   BNP: Invalid input(s): POCBNP CBG:  Recent Labs Lab 07/02/16 1632 07/02/16 1729 07/02/16 2054 07/03/16 0706 07/03/16 1229  GLUCAP 136* 136* 91 122* 152*   Urinalysis    Component Value Date/Time   COLORURINE YELLOW 12/07/2014 2006   APPEARANCEUR CLEAR 12/07/2014 2006   LABSPEC 1.009 12/07/2014 2006   PHURINE 7.5 12/07/2014 2006   GLUCOSEU NEGATIVE 12/07/2014 2006   GLUCOSEU NEGATIVE 02/09/2014 0953   HGBUR NEGATIVE 12/07/2014 2006   [place] NEGATIVE 12/07/2014 2006   KETONESUR NEGATIVE 12/07/2014 2006   PROTEINUR NEGATIVE 12/07/2014 2006   UROBILINOGEN 0.2 12/07/2014 2006   NITRITE NEGATIVE 12/07/2014 2006   LEUKOCYTESUR NEGATIVE 12/07/2014 2006   Sepsis  Labs Invalid input(s): PROCALCITONIN,  WBC,  LACTICIDVEN Microbiology Recent Results (from the past 240 hour(s))  Culture, sputum-assessment     Status: None   Collection Time: 06/29/16  9:40 PM  Result Value Ref Range Status   Specimen Description SPUTUM  Final   Special Requests NONE  Final   Sputum evaluation THIS SPECIMEN IS ACCEPTABLE FOR SPUTUM CULTURE  Final   Report Status 06/29/2016 FINAL  Final  Culture, respiratory (NON-Expectorated)     Status: None   Collection Time: 06/29/16  9:40 PM  Result Value Ref Range Status   Specimen Description SPUTUM  Final   Special Requests NONE Reflexed from [id number]  Final   Gram Stain   Final    RARE WBC PRESENT, PREDOMINANTLY PMN MODERATE GRAM NEGATIVE RODS RARE GRAM POSITIVE COCCI IN PAIRS Performed at [facility], [place] [address], [place], [place] [address]    Culture ABUNDANT STENOTROPHOMONAS MALTOPHILIA  Final   Report Status 07/02/2016 FINAL  Final   Organism ID, Bacteria STENOTROPHOMONAS MALTOPHILIA  Final      Susceptibility   Stenotrophomonas maltophilia - MIC*    LEVOFLOXACIN 1 SENSITIVE Sensitive     TRIMETH/SULFA >=320 RESISTANT Resistant     * ABUNDANT STENOTROPHOMONAS MALTOPHILIA  Culture, blood (routine x 2) Call MD if unable  to obtain prior to antibiotics being given     Status: None (Preliminary result)   Collection Time: 06/29/16 10:37 PM  Result Value Ref Range Status   Specimen Description BLOOD LEFT HAND  Final   Special Requests IN PEDIATRIC BOTTLE 4ML  Final   Culture   Final    NO GROWTH 3 DAYS Performed at [facility], [address], [place], [place] [address]    Report Status PENDING  Incomplete  Culture, blood (routine x 2) Call MD if unable to obtain prior to antibiotics being given     Status: None (Preliminary result)   Collection Time: 06/29/16 10:37 PM  Result Value Ref Range Status   Specimen Description BLOOD LEFT ANTECUBITAL  Final   Special Requests BOTTLES DRAWN AEROBIC AND  ANAEROBIC 7.5ML  Final   Culture   Final    NO GROWTH 3 DAYS Performed at [facility] [address], [place], [place] [address]    Report Status PENDING  Incomplete  MRSA PCR Screening     Status: None   Collection Time: 06/30/16  6:35 AM  Result Value Ref Range Status   MRSA by PCR NEGATIVE NEGATIVE Final    Comment:        The GeneXpert MRSA Assay (FDA approved for NASAL specimens only), is one component of a comprehensive MRSA colonization surveillance program. It is not intended to diagnose MRSA infection nor to guide or monitor treatment for MRSA infections. Performed at [facility], [place] [address] [place], [place] [address]      Time coordinating discharge: Over 30 minutes  SIGNED:  Bethena Roys, MD  [facility] 07/03/2016, 1:40 PM Pager [phone].  If 7PM-7AM, please contact night-coverage [email or web address] Password [email or web address]

## 2016-07-03 NOTE — Care Management Important Message (Signed)
Important Message  Patient Details  Name: Eric [name] MRN: [id number] Date of Birth: 09/27/1926   Medicare Important Message Given:  Yes    Kerin Salen 07/03/2016, 12:28 PM

## 2016-07-03 NOTE — Clinical Social Work Placement (Signed)
   CLINICAL SOCIAL WORK PLACEMENT  NOTE  Date:  07/03/2016  Patient Details  Name: Eric [name] MRN: [id number] Date of Birth: [birth year]  Clinical Social Work is seeking post-discharge placement for this patient at the [place] level of care (*CSW will initial, date and re-position this form in  chart as items are completed):  Yes   Patient/family provided with [place] Work Department's list of facilities offering this level of care within the geographic area requested by the patient (or if unable, by the patient's family).  Yes   Patient/family informed of their freedom to choose among providers that offer the needed level of care, that participate in Medicare, Medicaid or managed care program needed by the patient, have an available bed and are willing to accept the patient.  Yes   Patient/family informed of [place]'s ownership interest in [facility] and [facility], as well as of the fact that they are under no obligation to receive care at these facilities.  PASRR submitted to EDS on 07/03/16     PASRR number received on 07/03/16     Existing PASRR number confirmed on       FL2 transmitted to all facilities in geographic area requested by pt/family on 07/03/16     FL2 transmitted to all facilities within larger geographic area on       Patient informed that his/her managed care company has contracts with or will negotiate with certain facilities, including the following:        Yes   Patient/family informed of bed offers received.  Patient chooses bed at [facility]     Physician recommends and patient chooses bed at [facility]    Patient to be transferred to [facility] on 07/03/16.  Patient to be transferred to facility by EMS     Patient family notified on 07/03/16 of transfer.  Name of family member notified:  [name], Daughter     PHYSICIAN Please prepare  prescriptions, Please sign FL2     Additional Comment:    _______________________________________________ Lilly Cove, LCSW 07/03/2016, 10:25 AM

## 2016-07-03 NOTE — Progress Notes (Signed)
LCSW following for disposition. Patient has been recommended for SNF at DC. Would only like placement at [facility] and bed has been offered for today.  LCSW spoke with patient's daughter Eric [name].  She is agreeable to plan and will sign paperwork for patient at 1:30pm. Transportation can be arranged for after 2pm.  LCSW has sent over discharge paperwork to facility. RN to call report [phone].  Patient will transport by EMS.   No other needs.  Plan: DC to SNF: [facility].  Lane Hacker, MSW Clinical Social Work: [employer] Coverage for :  [phone]

## 2016-07-03 NOTE — Progress Notes (Addendum)
Attempted to give report X2 to SNF with no response.

## 2016-07-05 DIAGNOSIS — I10 Essential (primary) hypertension: Secondary | ICD-10-CM | POA: Diagnosis not present

## 2016-07-05 DIAGNOSIS — D6489 Other specified anemias: Secondary | ICD-10-CM | POA: Diagnosis not present

## 2016-07-05 DIAGNOSIS — E871 Hypo-osmolality and hyponatremia: Secondary | ICD-10-CM | POA: Diagnosis not present

## 2016-07-05 DIAGNOSIS — C9210 Chronic myeloid leukemia, BCR/ABL-positive, not having achieved remission: Secondary | ICD-10-CM | POA: Diagnosis not present

## 2016-07-05 DIAGNOSIS — G4733 Obstructive sleep apnea (adult) (pediatric): Secondary | ICD-10-CM | POA: Diagnosis not present

## 2016-07-05 DIAGNOSIS — J9600 Acute respiratory failure, unspecified whether with hypoxia or hypercapnia: Secondary | ICD-10-CM | POA: Diagnosis not present

## 2016-07-05 DIAGNOSIS — E784 Other hyperlipidemia: Secondary | ICD-10-CM | POA: Diagnosis not present

## 2016-07-05 DIAGNOSIS — J449 Chronic obstructive pulmonary disease, unspecified: Secondary | ICD-10-CM | POA: Diagnosis not present

## 2016-07-05 DIAGNOSIS — I5033 Acute on chronic diastolic (congestive) heart failure: Secondary | ICD-10-CM | POA: Diagnosis not present

## 2016-07-05 DIAGNOSIS — Z8546 Personal history of malignant neoplasm of prostate: Secondary | ICD-10-CM | POA: Diagnosis not present

## 2016-07-05 DIAGNOSIS — Z9981 Dependence on supplemental oxygen: Secondary | ICD-10-CM | POA: Diagnosis not present

## 2016-07-05 LAB — CULTURE, BLOOD (ROUTINE X 2)
CULTURE: NO GROWTH
Culture: NO GROWTH

## 2016-07-06 ENCOUNTER — Telehealth: Payer: Self-pay | Admitting: *Deleted

## 2016-07-06 DIAGNOSIS — C92Z0 Other myeloid leukemia not having achieved remission: Secondary | ICD-10-CM | POA: Diagnosis not present

## 2016-07-06 DIAGNOSIS — I5033 Acute on chronic diastolic (congestive) heart failure: Secondary | ICD-10-CM | POA: Diagnosis not present

## 2016-07-06 DIAGNOSIS — J9620 Acute and chronic respiratory failure, unspecified whether with hypoxia or hypercapnia: Secondary | ICD-10-CM | POA: Diagnosis not present

## 2016-07-06 DIAGNOSIS — J189 Pneumonia, unspecified organism: Secondary | ICD-10-CM | POA: Diagnosis not present

## 2016-07-06 LAB — BCR-ABL1, CML/ALL, PCR, QUANT: E1A2 TRANSCRIPT: 0.605 %

## 2016-07-06 NOTE — Telephone Encounter (Signed)
Pt was on the patient ping list admitted for SOB sxs on 06/29/16. Patient was evaluated and noted to be hypoxic and chest x-ray reveals congestive heart failure with superimposed pneumonia. Pt was D/C 1/23 and sent to SNF...Eric [name]

## 2016-07-07 ENCOUNTER — Ambulatory Visit: Payer: Self-pay | Admitting: *Deleted

## 2016-07-09 ENCOUNTER — Encounter: Payer: Self-pay | Admitting: *Deleted

## 2016-07-09 ENCOUNTER — Other Ambulatory Visit: Payer: Self-pay | Admitting: *Deleted

## 2016-07-09 ENCOUNTER — Ambulatory Visit (HOSPITAL_COMMUNITY)
Admission: RE | Admit: 2016-07-09 | Discharge: 2016-07-09 | Disposition: A | Discharge status: Home / Self Care | Payer: Medicare Other | Source: Ambulatory Visit | Attending: Hematology and Oncology | Admitting: Hematology and Oncology

## 2016-07-09 DIAGNOSIS — I5032 Chronic diastolic (congestive) heart failure: Secondary | ICD-10-CM

## 2016-07-10 ENCOUNTER — Other Ambulatory Visit: Payer: Self-pay | Admitting: *Deleted

## 2016-07-10 ENCOUNTER — Encounter: Payer: Self-pay | Admitting: *Deleted

## 2016-07-10 NOTE — Patient Outreach (Signed)
[facility]) Care Management  07/10/2016  Eric [name] May 28, 1927 [id number]   CSW was able to make initial contact with patient and patient's daughter, Eric [name] today to perform the initial assessment, as well as assess and assist with social work needs and services.  CSW was able to meet with patient at [facility] [place] where patient currently resides to receive short-term rehabilitative services.  However, CSW had to make contact with [name] by phone, as she was not present at the time of CSW's visit with patient.  CSW introduced self, explained role and types of services provided through [facility] ([facility]).  CSW further explained to patient that CSW works with patient's Geriatric Nurse Practitioner, Eric [name], also with [facility]. CSW then explained the reason for the visit/call, indicating that [name] thought that patient would benefit from social work services and resources to assist with possible discharge planning needs and services from the skilled facility.  CSW obtained two HIPAA compliant identifiers from patient, which included patient's name and date of birth. Both patient and [name] denied having any social work specific needs at present.  [name] explained that patient is scheduled to be discharged home tomorrow (Saturday, February 3rd).  [name] went on to say that patient will return home to live alone, but that he is agreeable to home health services being arranged, as well as durable medical equipment being ordered, if recommended.  [name], nor patient, were able to recall the name of the home health agency for CSW to confirm arrangements.  CSW also tried calling the Education officer, museum at [employer]; however, he was in a meeting at the time of CSW's visit and call.  CSW left HIPAA compliant messages and is awaiting a  response.  CSW has also left a HIPAA compliant message for [name], informing her of patient's discharge scheduled for tomorrow.  CSW will continue to follow along and is scheduled to outreach to patient again on Friday, February 9th to assess and assist with social work needs and services. [name], BSW, MSW, LCSW  Licensed Education officer, [facility]  Mailing [address], [place], [place] [address] Physical Address-[address], [place], [place] [address] Toll Free Main # [phone] Fax # [phone] Cell # [phone]  Office # [phone] [email or web address]

## 2016-07-13 ENCOUNTER — Other Ambulatory Visit: Payer: Self-pay | Admitting: *Deleted

## 2016-07-13 NOTE — Patient Outreach (Signed)
[facility]) Care Management  07/13/2016  Eric [name] 03/21/1927 [id number]  CSW was able to make contact with patient today, after CSW received an In [employer] from [name], Geriatric Nurse Practitioner with [facility], indicating that patient would benefit from social work services to assist with arranging short-term placement in a skilled nursing facility, preferably [facility] [place] where patient was residing as of Saturday, February 3rd.  Patient admitted to [name], as well as CSW that he feels like he left the facility too soon, needing several more weeks of rehabilitative services.  Patient indicated that he felt pressured by his daughter to leave the facility on Saturday when she came to visit him at [facility].  Patient reported, "They just don't want to be bothered with me and I'm just not strong enough yet to do it all by myself".  CSW voiced understanding and agreed to assist patient with placement, as soon as possible.   CSW made several calls to the Admissions/Social Work Department at [facility] to try and speak with a Education officer, museum regarding placement.  CSW was told that most of their disciplinary staff were in and out of treatment team meetings all day.  CSW requested an urgent call back, but is still awaiting a response.  CSW is aware that [name] has also tried Surveyor, minerals at [facility], without success.  CSW will schedule a home visit with patient to discuss the whole placement process, as well as provide patient with a list of skilled nursing facilities, in the event that [facility] will not take him back.  CSW has submitted an e-mail request to patient's Primary Care Physician, [name] to assist with completing and signing an FL-2 Form on patient for placement purposes.  CSW will pick up the completed and signed FL-2 Form from Dr.  Nathanial Millman office as soon as it is available. [name], BSW, MSW, LCSW  Licensed Education officer, environmental Health System  Mailing [place] [address] [place], [place] [address] Physical Address-[address], [place], [place] [address] Toll Free Main # [phone] Fax # [phone] Cell # [phone]  Office # [phone] [email or web address]

## 2016-07-13 NOTE — Patient Outreach (Signed)
Pt called me first thing this am asking me to help him get readmitted to [facility]. He states he thought he was ready to come home and now after 2 nights at home he realizes he cannot do it. She says there are a lot of people supposed to come out today including home health and [facility] to deliver his new oxygen delivery system.  I have assured the pt I will call [facility] to see if they will take him back. Otherwise we will have to go into a plan B.  I did call [facility] and left a message for the social worker. tp call me back as soon as possible.[name] [facility] [place] [phone]

## 2016-07-14 ENCOUNTER — Ambulatory Visit (INDEPENDENT_AMBULATORY_CARE_PROVIDER_SITE_OTHER): Payer: Medicare Other | Admitting: Interventional Cardiology

## 2016-07-14 ENCOUNTER — Other Ambulatory Visit: Payer: Self-pay | Admitting: *Deleted

## 2016-07-14 ENCOUNTER — Encounter: Payer: Self-pay | Admitting: Interventional Cardiology

## 2016-07-14 VITALS — BP 124/54 | HR 81 | Ht 66.0 in | Wt 211.0 lb

## 2016-07-14 DIAGNOSIS — I5032 Chronic diastolic (congestive) heart failure: Secondary | ICD-10-CM | POA: Diagnosis not present

## 2016-07-14 DIAGNOSIS — I25708 Atherosclerosis of coronary artery bypass graft(s), unspecified, with other forms of angina pectoris: Secondary | ICD-10-CM | POA: Diagnosis not present

## 2016-07-14 DIAGNOSIS — I10 Essential (primary) hypertension: Secondary | ICD-10-CM

## 2016-07-14 DIAGNOSIS — I495 Sick sinus syndrome: Secondary | ICD-10-CM

## 2016-07-14 DIAGNOSIS — J449 Chronic obstructive pulmonary disease, unspecified: Secondary | ICD-10-CM

## 2016-07-14 DIAGNOSIS — R531 Weakness: Secondary | ICD-10-CM | POA: Diagnosis not present

## 2016-07-14 DIAGNOSIS — I6523 Occlusion and stenosis of bilateral carotid arteries: Secondary | ICD-10-CM | POA: Diagnosis not present

## 2016-07-14 DIAGNOSIS — I110 Hypertensive heart disease with heart failure: Secondary | ICD-10-CM | POA: Diagnosis not present

## 2016-07-14 DIAGNOSIS — J9601 Acute respiratory failure with hypoxia: Secondary | ICD-10-CM | POA: Diagnosis not present

## 2016-07-14 NOTE — Progress Notes (Signed)
Cardiology Office Note    Date:  07/14/2016   ID:  CROSBY ORIORDAN, DOB [birth year], MRN [id number]  PCP:  [name], MD  Cardiologist: Sinclair Grooms, MD   Chief Complaint  Patient presents with  . Coronary Artery Disease  . Shortness of Breath    History of Present Illness:  Eric GUGGENHEIM is a 83 y.o. male who presents for tachybradycardia syndrome, permanent pacemaker, carotid artery disease, CABG 1995, coronary artery disease with DES 2008, 2009, and 2012.   Appetite is not good. Breathing is better.O2 saturation is greater than 95%. He monitors his closely at home. He has not needed nitroglycerin for chest discomfort. Multiple hospitalizations for respiratory insufficiency. Known chronic pulmonary therapy as well as moderate diuretic therapy. Some of his recent respiratory difficulty was thought to be a combination of acute on chronic diastolic heart failure and superimposed COPD with acute infection.  Is no current acute component. He  is back to independent living.   Past Medical History:  Diagnosis Date  . ALLERGIC RHINITIS   . ANEMIA-NOS   . AORTIC SCLEROSIS   . Asthma   . CARDIOMYOPATHY, ISCHEMIC   . CAROTID BRUIT, RIGHT 02/27/2008  . Cataract    surgery  . CML (chronic myeloid leukemia) ([place]) 06/26/2015  . COPD   . CORONARY ARTERY DISEASE    a. s/p CABG in 1995 b. DES in 2008, 2009, and most recent in 2012 with DES to SVG-OM  . DIABETES MELLITUS-TYPE II    diet controlled  . Diastolic dysfunction, Grade 1 11/24/2014  . Diverticulitis of colon with perforation 11/22/2014  . Diverticulosis   . GERD   . HIATAL HERNIA   . Hx of echocardiogram    Echo (9/15):  Mild LVH, EF 50-55%, no RWMA, Gr 1 DD, MAC, mild LAE.  [name] HYPERLIPIDEMIA   . HYPERTENSION   . Hyponatremia 11/22/2014  . IBS (irritable bowel syndrome)   . LACTOSE INTOLERANCE   . OA (osteoarthritis)   . OBESITY   . Partial small bowel obstruction   . PERIPHERAL VASCULAR DISEASE   .  Primary hyperparathyroidism ([place])    Lab Results Component Value Date  PTH 150.7* 02/13/2013  CALCIUM 11.0* 02/13/2013  CAION 1.21 03/15/2008    . Prostate cancer ([place])    seed implants 2004  . SICK SINUS/ TACHY-BRADY SYNDROME 09/2007   s/p PPM st judes  . Sleep apnea   . SMALL BOWEL OBSTRUCTION 04/18/2009   Qualifier: History of  By: Asa Lente MD, Jannifer Rodney Symptomatic diverticulosis 01/18/2009   Qualifier: Diagnosis of  By: Shane Crutch, Amy S     Past Surgical History:  Procedure Laterality Date  . BACK SURGERY    . Bilateral cataracts    . COLON RESECTION N/A 11/28/2014   Procedure: EXPLORATORY LAPAROTOMY, SIGMOID COLECTOMY WITH COLOSTOMY;  Surgeon: Jackolyn Confer, MD;  Location: [facility];  Service: General;  Laterality: N/A;  . COLON SURGERY    . COLONOSCOPY    . CORONARY ARTERY BYPASS GRAFT    . ESOPHAGOGASTRODUODENOSCOPY  multiple  . FLEXIBLE SIGMOIDOSCOPY N/A 09/22/2013   Procedure: FLEXIBLE SIGMOIDOSCOPY;  Surgeon: Gatha Mayer, MD;  Location: [facility] ENDOS[facility];  Service: Endoscopy;  Laterality: N/A;  . INGUINAL HERNIA REPAIR Bilateral   . LUMBAR [place] SURGERY  12/2008  . PACEMAKER INSERTION     DDD/[employer]         Last interrogation 2/13  on chart     Pacemaker  guideline order Dr Tamala Julian on chart  . Partial small bowel obstruction  2009  . PENILE PROSTHESIS PLACEMENT    . PTCA  2008, 2009, 2012   with DES  . TOTAL HIP ARTHROPLASTY  08/21/2011   Procedure: TOTAL HIP ARTHROPLASTY;  Surgeon: Johnn Hai, MD;  Location: [facility];  Service: Orthopedics;  Laterality: Right;    Current Medications: Outpatient Medications Prior to Visit  Medication Sig Dispense Refill  . acetaminophen (TYLENOL) 500 MG tablet Take 1,000 mg by mouth every 6 (six) hours as needed for mild pain or fever.    [name] albuterol (PROVENTIL HFA) 108 (90 Base) MCG/ACT inhaler Inhale 2 puffs into the lungs every 4 (four) hours as needed for shortness of breath. Wheezing 1 Inhaler 1  . albuterol (PROVENTIL)  (2.5 MG/3ML) 0.083% nebulizer solution USE 1 VIAL WITH NEBULIZER EVERY 4 HOURS AS NEEDED FOR WHEEZING 75 mL 2  . albuterol-ipratropium (COMBIVENT) 18-103 MCG/ACT inhaler Inhale 1 puff into the lungs every 6 (six) hours as needed for wheezing or shortness of breath.    [name] aspirin 81 MG tablet Take 81 mg by mouth every evening.     . feeding supplement, ENSURE ENLIVE, (ENSURE ENLIVE) LIQD Take 237 mLs by mouth 2 (two) times daily between meals. 90 Bottle 11  . fluticasone (FLONASE) 50 MCG/ACT nasal spray Place 2 sprays into both nostrils daily. Allergies (Patient taking differently: Place 2 sprays into both nostrils 2 (two) times daily as needed for allergies. Allergies) 16 g 1  . furosemide (LASIX) 40 MG tablet Take 1 tablet (40 mg total) by mouth daily. 90 tablet 1  . guaiFENesin-dextromethorphan (ROBITUSSIN DM) 100-10 MG/5ML syrup Take 5 mLs by mouth every 4 (four) hours as needed for cough. 118 mL 0  . HYDROcodone-acetaminophen (NORCO) 7.5-325 MG tablet Take 1 tablet by mouth every 6 (six) hours as needed for moderate pain.    [name] levofloxacin (LEVAQUIN) 500 MG tablet Take 1 tablet (500 mg total) by mouth daily. 12 tablet 0  . nitroGLYCERIN (NITROSTAT) 0.4 MG SL tablet Place 0.4 mg under the tongue every 5 (five) minutes as needed for chest pain. Reported on 12/12/2015    . Polyethyl Glycol-Propyl Glycol (SYSTANE) 0.4-0.3 % SOLN Apply 2 drops to eye 3 (three) times daily as needed (dry eyes).     . pravastatin (PRAVACHOL) 20 MG tablet Take 3 tablets (60 mg total) by mouth daily. 270 tablet 3  . ranitidine (ZANTAC) 300 MG tablet Take 300 mg by mouth at bedtime.     . triamcinolone cream (KENALOG) 0.1 % Apply 1 application topically 3 (three) times daily as needed (dry skin.).   3  . valsartan (DIOVAN) 160 MG tablet Take 1 tablet (160 mg total) by mouth daily. (Patient taking differently: Take 160 mg by mouth 2 (two) times daily. ) 90 tablet 3   No facility-administered medications prior to visit.       Allergies:   Actos [pioglitazone hydrochloride]; Buprenorphine hcl; Celebrex [celecoxib]; Demerol; Meperidine; Morphine and related; Ciprofloxacin; Metformin; and Zocor [simvastatin]   Social History   Social History  . Marital status: Widowed    Spouse name: N/A  . Number of children: N/A  . Years of education: N/A   Occupational History  . retired Retired   Social History Main Topics  . Smoking status: Former Smoker    Packs/day: 1.00    Years: 25.00    Types: Cigarettes    Quit date: 06/08/1994  . Smokeless tobacco: Never Used  .  Alcohol use No  . Drug use: No  . 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 None   Social History Narrative  . None     Family History:  The patient's family history includes Cancer in his mother; Heart attack in his brother; Hypertension in his mother.   ROS:   Please see the history of present illness.    Frustration with losing independence. Hearing loss and vision disturbance. Dizziness and easy bruising. Occasional snoring, constipation, headaches, and bleeding. Irregular heartbeats. Excessive fatigue.  All other systems reviewed and are negative.   PHYSICAL EXAM:   VS:  BP (!) 124/54 (BP Location: Right Arm)   Pulse 81   Ht _0  (1.676 m)   Wt 211 lb (95.7 kg)   BMI 34.06 kg/m    GEN: Well nourished, well developed, in no acute distress  HEENT: normal  Neck: no JVD, carotid bruits, or masses Cardiac: RRR; no murmurs, rubs, or gallops,no edema  Respiratory:  clear to auscultation bilaterally, normal work of breathing GI: soft, nontender, nondistended, + BS MS: no deformity or atrophy  Skin: warm and dry, no rash Neuro:  Alert and Oriented x 3, Strength and sensation are intact Psych: euthymic mood, full affect  Wt Readings from Last 3 Encounters:  07/14/16 211 lb (95.7 kg)  07/02/16 212 lb 4.9 oz (96.3 kg)  06/23/16 214 lb (97.1 kg)       Studies/Labs Reviewed:   EKG:  EKG  nsr with irbbb.  Recent Labs: 11/27/2015: Pro B Natriuretic peptide (BNP) 296.0 05/19/2016: TSH 1.754 06/29/2016: B Natriuretic Peptide 223.0 07/01/2016: Magnesium 2.3 07/02/2016: ALT 31; BUN 37; Creatinine, Ser 1.16; Hemoglobin 9.7; Platelets 330; Potassium 4.2; Sodium 135   Lipid Panel    Component Value Date/Time   CHOL 108 05/27/2015 1512   TRIG 151.0 (H) 05/27/2015 1512   TRIG 70 01/06/2010   HDL 22.70 (L) 05/27/2015 1512   CHOLHDL 5 05/27/2015 1512   VLDL 30.2 05/27/2015 1512   LDLCALC 55 05/27/2015 1512    Additional studies/ records that were reviewed today include:  Echocardiogram 05/19/16 Study Conclusions  - Left ventricle: The cavity size was normal. Wall thickness was   increased in a pattern of mild LVH. Systolic function was normal.   The estimated ejection fraction was in the range of 55% to 65%.   Wall motion was normal; there were no regional wall motion   abnormalities. Doppler parameters are consistent with abnormal   left ventricular relaxation (grade 1 diastolic dysfunction). - Aortic valve: Mildly calcified annulus. Mildly calcified   leaflets. Valve area (VTI): 1.9 cm^2. Valve area (Vmax): 1.88   cm^2. Valve area (Vmean): 1.71 cm^2.    ASSESSMENT:    1. Coronary artery disease of bypass graft of native heart with stable angina pectoris ([place])   2. Chronic diastolic heart failure ([place])   3. COPD mixed type ([place])   4. Tachy-brady syndrome ([place])   5. Bilateral carotid artery stenosis   6. Essential hypertension      PLAN:  In order of problems listed above:  1. Stable without anginal complaints.He has nitroglycerin if it becomes necessary. 2. Reviewed much of the clinical data in the chart. He has had no BNP greater than 300. Chest x-ray suggests interstitial edema on numerous readings I have a clinical exam does not reveal evidence of volume overload. He is on chronic  oxygen therapy. LV systolic  function reveals an EF of 60%. 3. Now on chronic O2 therapy. Long tobacco use history. 4. DDD pacemaker without evidence of recent significant arrhythmia. 5. Other addressed 6. Excellent control    Medication Adjustments/Labs and Tests Ordered: Current medicines are reviewed at length with the patient today.  Concerns regarding medicines are outlined above.  Medication changes, Labs and Tests ordered today are listed in the Patient Instructions below. Patient Instructions  Medication Instructions:  None  Labwork: None  Testing/Procedures: None  Follow-Up: Your physician wants you to follow-up in: 1 year with Dr. Tamala Julian.  You will receive a reminder letter in the mail two months in advance. If you don't receive a letter, please call our office to schedule the follow-up appointment.   Any Other Special Instructions Will Be Listed Below (If Applicable).     If you need a refill on your cardiac medications before your next appointment, please call your pharmacy.      Signed, Sinclair Grooms, MD  07/14/2016 2:44 PM    [facility] [place], [place], [place]  [address] Phone: [phone]; Fax: [phone]

## 2016-07-14 NOTE — Patient Outreach (Signed)
[facility]) Care Management  07/14/2016  Eric [name] 18-Jun-1926 [id number]   CSW was able to make contact with [name], Admissions Coordinator at [facility] today to discuss possible readmission for patient for continued therapies (both physical and occupational).  According to [name], patient will need to be evaluated by a home health physical therapist and notes will need to be faxed to their admissions department for review, before a definitive decision can be made.  In addition, the therapists notes will need to specify that patient is not able to perform activities of daily living independently in the home, requiring inpatient rehabilitative services in a skilled setting.  [name] admitted that this is a requirement of Medicare since patient signed himself out of the facility, against medical advise. Both the physical therapist and occupational therapists documented that they discouraged patient from leaving the facility, recommending that patient at least complete an additional two week course of strengthening, conditioning, mobility, safety, ambulation, etc.  CSW has communicated this information to [name], Geriatric Nurse Practitioner with [facility].  CSW has also obtained a completed and signed FL-2 Form from patient's Primary Care Physician, [name]. CSW was able to make contact with patient today to follow-up regarding need for placement into a skilled nursing facility for short-term rehabilitative services.  CSW explained to patient all of the above information, ensuring that patient was able to voice understanding.  Patient admitted that home health physical therapy was supposed to have been arranged for him, but because he left so abruptly, no one had time to make the appropriate arrangements.  Patient is agreeable to home health physical therapy, today denying the  need for inpatient rehabilitative services.  Patient reported, "I feel like I'm getting better and stronger every day".  Patient went on to say that his oxygen was delivered yesterday and that he plans to attend his physician appointment today.  Patient verbalized that he has four daughters but that none of them want to do anything for him.  Patient admits that he has transportation arranged to and from his physician appointment today.  Patient denied wanting [name] through [facility] [place].  Patient reported that he was able to make up his bed this morning, as well as fix himself breakfast.  CSW will converse with [name] to request arrangement of home health services for patient.  CSW agreed to follow-up with patient in one week to ensure that all home health arrangements are in place, as well as assess for any additional social work needs. [name], BSW, MSW, LCSW  Licensed Education officer, environmental Health System  Mailing [place] [address], [place], [place] [address] Physical Address-[address], [place], [place] [address] Toll Free Main # [phone] Fax # [phone] Cell # [phone]  Office # [phone] [email or web address]

## 2016-07-14 NOTE — Patient Outreach (Signed)
Transition or care call. Pt is feeling better about being back in his independent living apartment. He says he started feeling much better yesterday afternoon. He did get his new O2 delivery system which is light weight and easier for him to cope with. He will go to his cardiologist today. He has not had any home health staff visit him. I have asked if I can come see him in the am and he has agreed.  [name] [facility] [phone]

## 2016-07-14 NOTE — Patient Instructions (Signed)

## 2016-07-15 ENCOUNTER — Encounter: Payer: Self-pay | Admitting: *Deleted

## 2016-07-15 ENCOUNTER — Other Ambulatory Visit: Payer: Self-pay | Admitting: *Deleted

## 2016-07-15 NOTE — Patient Outreach (Addendum)
Initial home visit. Pt came home from [facility] on Saturday. Monday morning pt called me and he was uncertain he had made a mistake in coming home too early. By Monday afternoon when we talked he reported he was feeling much better and that he didn't think he needed to go back to the facility. [facility] is coming out for PT but pt states that he gets more activity by walking around his apartment and back and forth to the activity room which is a good distance.   Pt has seen Dr. Tamala Julian, cardiology, yesterday. He states he needs to see his pulmonologist, Dr. Annamaria Boots. He will see his oncologist on 08/04/16.  Pt reports a loss of appetite. He has plenty of food.   Pt reports he was sent home with someone else's eye drops (same type he takes) from [employer]. When he was sent home from [facility] on Saturday, they gave him someone else's ELIQUIS! He is not on eliquis or any other anticoagulant. Luckily he realized this was not his medication and did not take it.  Patient was recently discharged from hospital and all medications have been reviewed. Allergies as of 07/15/2016      Reactions   Actos [pioglitazone Hydrochloride] Other (See Comments)   "felt funny, drowsy, and weak":   Buprenorphine Hcl Nausea And Vomiting   Celebrex [celecoxib] Other (See Comments)   "felt funny"   Demerol Palpitations, Other (See Comments)   Increased BP   Meperidine Palpitations   Other reaction(s): Other (See Comments) Increased BP   Morphine And Related Nausea And Vomiting   Ciprofloxacin Other (See Comments)   arthralgia   Metformin Nausea And Vomiting   Zocor [simvastatin] Other (See Comments)   Makes pt very drowsy      Medication List       Accurate as of 07/15/16 11:59 PM. Always use your most recent med list.          acetaminophen 500 MG tablet Commonly known as:  TYLENOL Take 1,000 mg by mouth every 6 (six) hours as needed for mild pain or fever.   albuterol (2.5 MG/3ML) 0.083% nebulizer  solution Commonly known as:  PROVENTIL USE 1 VIAL WITH NEBULIZER EVERY 4 HOURS AS NEEDED FOR WHEEZING   albuterol 108 (90 Base) MCG/ACT inhaler Commonly known as:  PROVENTIL HFA Inhale 2 puffs into the lungs every 4 (four) hours as needed for shortness of breath. Wheezing   albuterol-ipratropium 18-103 MCG/ACT inhaler Commonly known as:  COMBIVENT Inhale 1 puff into the lungs every 6 (six) hours as needed for wheezing or shortness of breath.   aspirin 81 MG tablet Take 81 mg by mouth every evening.   feeding supplement (ENSURE ENLIVE) Liqd Take 237 mLs by mouth 2 (two) times daily between meals.   fluticasone 50 MCG/ACT nasal spray Commonly known as:  FLONASE Place 2 sprays into both nostrils daily. Allergies   furosemide 40 MG tablet Commonly known as:  LASIX Take 1 tablet (40 mg total) by mouth daily.   guaiFENesin-dextromethorphan 100-10 MG/5ML syrup Commonly known as:  ROBITUSSIN DM Take 5 mLs by mouth every 4 (four) hours as needed for cough.   HYDROcodone-acetaminophen 7.5-325 MG tablet Commonly known as:  NORCO Take 1 tablet by mouth every 6 (six) hours as needed for moderate pain.   levofloxacin 500 MG tablet Commonly known as:  LEVAQUIN Take 1 tablet (500 mg total) by mouth daily.   nitroGLYCERIN 0.4 MG SL tablet Commonly known as:  NITROSTAT Place 0.4 mg  under the tongue every 5 (five) minutes as needed for chest pain. Reported on 12/12/2015   pravastatin 20 MG tablet Commonly known as:  PRAVACHOL Take 3 tablets (60 mg total) by mouth daily.   ranitidine 300 MG tablet Commonly known as:  ZANTAC Take 300 mg by mouth at bedtime.   SYSTANE 0.4-0.3 % Soln Generic drug:  Polyethyl Glycol-Propyl Glycol Apply 2 drops to eye 3 (three) times daily as needed (dry eyes).   triamcinolone cream 0.1 % Commonly known as:  KENALOG Apply 1 application topically 3 (three) times daily as needed (dry skin.).   valsartan 160 MG tablet Commonly known as:  DIOVAN Take 1  tablet (160 mg total) by mouth daily.       O:  BP (!) 142/66 (BP Location: Right Arm, Patient Position: Sitting, Cuff Size: Normal)   Pulse 74   Resp 18   Ht 1.676 m ('5\' 6"' )   Wt 210 lb (95.3 kg)   SpO2 98% Comment: O2 3L  BMI 33.89 kg/m         RRR       Lungs are clear       No edema  A:  COPD stable      CHF stable      CML improved after transfusion  P:  Reinforced Action Plans for COPD and CHF      Advised pt to call me if any problems for prompt intervention to prevent complications.      I will visit with pt again next week.  THN CM Care Plan Problem One   Flowsheet Row Most Recent Value  Care Plan Problem One  2 Episodes of COPD exacerbation requiring hospital admissions within 30 days.  Role Documenting the Problem One  Care Management Coordinator  Care Plan for Problem One  Active  THN Long Term Goal (31-90 days)  Pt will not be rehospitalized in the next 60 days for COPD.  THN Long Term Goal Start Date  06/09/16  Interventions for Problem One Long Term Goal  Reminded pt that I am available to call for any problems.  THN CM Short Term Goal #1 (0-30 days)  Pt will participate in weekly transition of care calls over the next 4 weeks.  THN CM Short Term Goal #1 Start Date  06/09/16  [facility] CM Short Term Goal #1 Met Date  07/14/16  Interventions for Short Term Goal #1  In home assessment completed 07/15/16.  THN CM Short Term Goal #2 (0-30 days)  Pt will report that he has received O2 tanks that he is able to lift comfortably within the next week.  THN CM Short Term Goal #2 Start Date  06/09/16  [facility] CM Short Term Goal #2 Met Date  07/13/16  Interventions for Short Term Goal #2  I advised pt he should participate but if he prefers not to this is his decision.     [name] [facility] [place] [phone]

## 2016-07-16 ENCOUNTER — Other Ambulatory Visit: Payer: Self-pay | Admitting: *Deleted

## 2016-07-16 ENCOUNTER — Telehealth: Payer: Self-pay | Admitting: Emergency Medicine

## 2016-07-16 DIAGNOSIS — I5032 Chronic diastolic (congestive) heart failure: Secondary | ICD-10-CM | POA: Diagnosis not present

## 2016-07-16 DIAGNOSIS — C9210 Chronic myeloid leukemia, BCR/ABL-positive, not having achieved remission: Secondary | ICD-10-CM | POA: Diagnosis not present

## 2016-07-16 DIAGNOSIS — I2510 Atherosclerotic heart disease of native coronary artery without angina pectoris: Secondary | ICD-10-CM | POA: Diagnosis not present

## 2016-07-16 DIAGNOSIS — E0965 Drug or chemical induced diabetes mellitus with hyperglycemia: Secondary | ICD-10-CM | POA: Diagnosis not present

## 2016-07-16 DIAGNOSIS — I110 Hypertensive heart disease with heart failure: Secondary | ICD-10-CM | POA: Diagnosis not present

## 2016-07-16 DIAGNOSIS — J441 Chronic obstructive pulmonary disease with (acute) exacerbation: Secondary | ICD-10-CM | POA: Diagnosis not present

## 2016-07-16 NOTE — Telephone Encounter (Signed)
[facility] called and needs verbal orders to continue care 1 time a wk for 1 wk, 2 times a  wk for 3 wks and 1 time a wk for 1 wk. Please advise thanks.

## 2016-07-16 NOTE — Telephone Encounter (Signed)
Okay 

## 2016-07-17 ENCOUNTER — Ambulatory Visit: Payer: Self-pay | Admitting: *Deleted

## 2016-07-17 DIAGNOSIS — I110 Hypertensive heart disease with heart failure: Secondary | ICD-10-CM | POA: Diagnosis not present

## 2016-07-17 DIAGNOSIS — E0965 Drug or chemical induced diabetes mellitus with hyperglycemia: Secondary | ICD-10-CM | POA: Diagnosis not present

## 2016-07-17 DIAGNOSIS — C9210 Chronic myeloid leukemia, BCR/ABL-positive, not having achieved remission: Secondary | ICD-10-CM | POA: Diagnosis not present

## 2016-07-17 DIAGNOSIS — J441 Chronic obstructive pulmonary disease with (acute) exacerbation: Secondary | ICD-10-CM | POA: Diagnosis not present

## 2016-07-17 DIAGNOSIS — I2510 Atherosclerotic heart disease of native coronary artery without angina pectoris: Secondary | ICD-10-CM | POA: Diagnosis not present

## 2016-07-17 DIAGNOSIS — I5032 Chronic diastolic (congestive) heart failure: Secondary | ICD-10-CM | POA: Diagnosis not present

## 2016-07-17 NOTE — Telephone Encounter (Signed)
LVM with [facility] with verbal orders

## 2016-07-20 DIAGNOSIS — E0965 Drug or chemical induced diabetes mellitus with hyperglycemia: Secondary | ICD-10-CM | POA: Diagnosis not present

## 2016-07-20 DIAGNOSIS — I2510 Atherosclerotic heart disease of native coronary artery without angina pectoris: Secondary | ICD-10-CM | POA: Diagnosis not present

## 2016-07-20 DIAGNOSIS — I110 Hypertensive heart disease with heart failure: Secondary | ICD-10-CM | POA: Diagnosis not present

## 2016-07-20 DIAGNOSIS — I5032 Chronic diastolic (congestive) heart failure: Secondary | ICD-10-CM | POA: Diagnosis not present

## 2016-07-20 DIAGNOSIS — C9210 Chronic myeloid leukemia, BCR/ABL-positive, not having achieved remission: Secondary | ICD-10-CM | POA: Diagnosis not present

## 2016-07-20 DIAGNOSIS — J441 Chronic obstructive pulmonary disease with (acute) exacerbation: Secondary | ICD-10-CM | POA: Diagnosis not present

## 2016-07-21 ENCOUNTER — Other Ambulatory Visit: Payer: Self-pay | Admitting: *Deleted

## 2016-07-21 ENCOUNTER — Encounter: Payer: Self-pay | Admitting: Adult Health

## 2016-07-21 ENCOUNTER — Ambulatory Visit (INDEPENDENT_AMBULATORY_CARE_PROVIDER_SITE_OTHER): Payer: Medicare Other | Admitting: Adult Health

## 2016-07-21 VITALS — BP 146/66 | HR 72 | Ht 66.0 in | Wt 211.0 lb

## 2016-07-21 DIAGNOSIS — J9601 Acute respiratory failure with hypoxia: Secondary | ICD-10-CM

## 2016-07-21 DIAGNOSIS — J449 Chronic obstructive pulmonary disease, unspecified: Secondary | ICD-10-CM | POA: Diagnosis not present

## 2016-07-21 DIAGNOSIS — G4733 Obstructive sleep apnea (adult) (pediatric): Secondary | ICD-10-CM

## 2016-07-21 DIAGNOSIS — J9611 Chronic respiratory failure with hypoxia: Secondary | ICD-10-CM | POA: Diagnosis not present

## 2016-07-21 DIAGNOSIS — I6523 Occlusion and stenosis of bilateral carotid arteries: Secondary | ICD-10-CM

## 2016-07-21 DIAGNOSIS — I5032 Chronic diastolic (congestive) heart failure: Secondary | ICD-10-CM | POA: Diagnosis not present

## 2016-07-21 DIAGNOSIS — J9610 Chronic respiratory failure, unspecified whether with hypoxia or hypercapnia: Secondary | ICD-10-CM | POA: Insufficient documentation

## 2016-07-21 MED ORDER — UMECLIDINIUM-VILANTEROL 62.5-25 MCG/INH IN AEPB: 1.0000 | INHALATION_SPRAY | Freq: Every day | RESPIRATORY_TRACT | 0 refills | Status: DC

## 2016-07-21 NOTE — Progress Notes (Signed)
_0  ID: Eric [name], male    DOB: Mar 29, 1927, 83 y.o.   MRN: [id number]  Chief Complaint  Patient presents with  . Follow-up    COPD     Referring provider: [name], *  HPI: 83 year old male former smoker, veteran, followed for asthma, COPD and obstructive sleep apnea Past medical history of coronary artery disease and tachy/brady syn s/p pacemaker Has CLL f/by Oncology    07/21/2016 [facility] : COPD  Pt returns for 1 month follow up . He has been admitted 3 times in last 2 months.  Last admission on 07/03/16 with  COPD flare , PNA w/ stenotrophomonas resistant to Bactrim, sensitive to Levaquin. ID consult w/ recs for 14 d of Levaquiin. Admitted in Dec x 2  for COPD w/ RSV/Parainfluenza , tx/ w steroids and abx.  Pt is starting to feel better but remains very weak. Wears out walking w/fatigue /sob.  He denies chest pain, edema or fever.  He remains on combivent As needed  . Uses on avg 2 x day.  He uses Oxygen 2l/m rest and 3 /m walking  Has OSA on CPAP At bedtime  . Says he cant wear last couple of weeks , feels the pressure is too high.    Allergies  Allergen Reactions  . Actos [Pioglitazone Hydrochloride] Other (See Comments)    "felt funny, drowsy, and weak":  [name] Buprenorphine Hcl Nausea And Vomiting  . Celebrex [Celecoxib] Other (See Comments)    "felt funny"  . Demerol Palpitations and Other (See Comments)    Increased BP  . Meperidine Palpitations    Other reaction(s): Other (See Comments) Increased BP  . Morphine And Related Nausea And Vomiting  . Ciprofloxacin Other (See Comments)    arthralgia  . Metformin Nausea And Vomiting  . Zocor [Simvastatin] Other (See Comments)    Makes pt very drowsy    Immunization History  Administered Date(s) Administered  . Influenza Split 06/09/2011, 03/01/2012, 04/08/2014  . Influenza, High Dose Seasonal PF 02/20/2016  . Influenza,inj,Quad PF,36+ Mos 02/02/2013, 01/23/2014, 03/12/2015  .  Pneumococcal Conjugate-13 05/23/2014  . Pneumococcal Polysaccharide-23 02/27/2008  . Td 08/07/2008    Past Medical History:  Diagnosis Date  . ALLERGIC RHINITIS   . ANEMIA-NOS   . AORTIC SCLEROSIS   . Asthma   . CARDIOMYOPATHY, ISCHEMIC   . CAROTID BRUIT, RIGHT 02/27/2008  . Cataract    surgery  . CML (chronic myeloid leukemia) ([place]) 06/26/2015  . COPD   . CORONARY ARTERY DISEASE    a. s/p CABG in 1995 b. DES in 2008, 2009, and most recent in 2012 with DES to SVG-OM  . DIABETES MELLITUS-TYPE II    diet controlled  . Diastolic dysfunction, Grade 1 11/24/2014  . Diverticulitis of colon with perforation 11/22/2014  . Diverticulosis   . GERD   . HIATAL HERNIA   . Hx of echocardiogram    Echo (9/15):  Mild LVH, EF 50-55%, no RWMA, Gr 1 DD, MAC, mild LAE.  [name] HYPERLIPIDEMIA   . HYPERTENSION   . Hyponatremia 11/22/2014  . IBS (irritable bowel syndrome)   . LACTOSE INTOLERANCE   . OA (osteoarthritis)   . OBESITY   . Partial small bowel obstruction   . PERIPHERAL VASCULAR DISEASE   . Primary hyperparathyroidism ([place])    Lab Results Component Value Date  PTH 150.7* 02/13/2013  CALCIUM 11.0* 02/13/2013  CAION 1.21 03/15/2008    . Prostate cancer ([place])    seed implants 2004  .  SICK SINUS/ TACHY-BRADY SYNDROME 09/2007   s/p PPM st judes  . Sleep apnea   . SMALL BOWEL OBSTRUCTION 04/18/2009   Qualifier: History of  By: Asa Lente MD, Jannifer Rodney Symptomatic diverticulosis 01/18/2009   Qualifier: Diagnosis of  By: Trellis Paganini PA-c, Amy S     Tobacco History: History  Smoking Status  . Former Smoker  . Packs/day: 1.00  . Years: 25.00  . Types: Cigarettes  . Quit date: 06/08/1994  Smokeless Tobacco  . Never Used   Counseling given: Not Answered   Outpatient Encounter Prescriptions as of 07/21/2016  Medication Sig  . acetaminophen (TYLENOL) 500 MG tablet Take 1,000 mg by mouth every 6 (six) hours as needed for mild pain or fever.  [name] albuterol (PROVENTIL HFA) 108 (90 Base) MCG/ACT  inhaler Inhale 2 puffs into the lungs every 4 (four) hours as needed for shortness of breath. Wheezing  . albuterol (PROVENTIL) (2.5 MG/3ML) 0.083% nebulizer solution USE 1 VIAL WITH NEBULIZER EVERY 4 HOURS AS NEEDED FOR WHEEZING  . albuterol-ipratropium (COMBIVENT) 18-103 MCG/ACT inhaler Inhale 1 puff into the lungs every 6 (six) hours as needed for wheezing or shortness of breath.  [name] aspirin 81 MG tablet Take 81 mg by mouth every evening.   . feeding supplement, ENSURE ENLIVE, (ENSURE ENLIVE) LIQD Take 237 mLs by mouth 2 (two) times daily between meals.  . fluticasone (FLONASE) 50 MCG/ACT nasal spray Place 2 sprays into both nostrils daily. Allergies (Patient taking differently: Place 2 sprays into both nostrils 2 (two) times daily as needed for allergies. Allergies)  . furosemide (LASIX) 40 MG tablet Take 1 tablet (40 mg total) by mouth daily.  [name] guaiFENesin-dextromethorphan (ROBITUSSIN DM) 100-10 MG/5ML syrup Take 5 mLs by mouth every 4 (four) hours as needed for cough.  [name] HYDROcodone-acetaminophen (NORCO) 7.5-325 MG tablet Take 1 tablet by mouth every 6 (six) hours as needed for moderate pain.  . nitroGLYCERIN (NITROSTAT) 0.4 MG SL tablet Place 0.4 mg under the tongue every 5 (five) minutes as needed for chest pain. Reported on 12/12/2015  . Polyethyl Glycol-Propyl Glycol (SYSTANE) 0.4-0.3 % SOLN Apply 2 drops to eye 3 (three) times daily as needed (dry eyes).   . pravastatin (PRAVACHOL) 20 MG tablet Take 3 tablets (60 mg total) by mouth daily.  . ranitidine (ZANTAC) 300 MG tablet Take 300 mg by mouth at bedtime.   . triamcinolone cream (KENALOG) 0.1 % Apply 1 application topically 3 (three) times daily as needed (dry skin.).   [name] valsartan (DIOVAN) 160 MG tablet Take 1 tablet (160 mg total) by mouth daily. (Patient taking differently: Take 160 mg by mouth 2 (two) times daily. )   No facility-administered encounter medications on file as of 07/21/2016.      Review of Systems  Constitutional:    No  weight loss, night sweats,  Fevers, chills +, fatigue, or  lassitude.  HEENT:   No headaches,  Difficulty swallowing,  Tooth/dental problems, or  Sore throat,                No sneezing, itching, ear ache, +nasal congestion, post nasal drip,   CV:  No chest pain,  Orthopnea, PND, swelling in lower extremities, anasarca, dizziness, palpitations, syncope.   GI  No heartburn, indigestion, abdominal pain, nausea, vomiting, diarrhea, change in bowel habits, loss of appetite, bloody stools.   Resp:   No wheezing.  No chest wall deformity  Skin: no rash or lesions.  GU: no dysuria, change in color  of urine, no urgency or frequency.  No flank pain, no hematuria   MS:  No joint pain or swelling.  No decreased range of motion.  No back pain.    Physical Exam  BP (!) 146/66 (BP Location: Right Arm, Cuff Size: Normal)   Pulse 72   Ht _0  (1.676 m)   Wt 211 lb (95.7 kg)   SpO2 97%   BMI 34.06 kg/m   GEN: A/Ox3; pleasant , NAD, elderly , on o2    HEENT:  [place]/AT,  EACs-clear, TMs-wnl, NOSE-clear, THROAT-clear, no lesions, no postnasal drip or exudate noted.   NECK:  Supple w/ fair ROM; no JVD; normal carotid impulses w/o bruits; no thyromegaly or nodules palpated; no lymphadenopathy.    RESP  Decreased BS in bases ,  no accessory muscle use, no dullness to percussion  CARD:  RRR, no m/r/g, no peripheral edema, pulses intact, no cyanosis or clubbing.  GI:   Soft & nt; nml bowel sounds; no organomegaly or masses detected.   Musco: Warm bil, no deformities or joint swelling noted.   Neuro: alert, no focal deficits noted.    Skin: Warm, no lesions or rashes    Lab Results:  CBC    Component Value Date/Time   WBC 8.5 07/02/2016 0543   RBC 3.15 (L) 07/02/2016 0543   HGB 9.7 (L) 07/02/2016 0543   HGB 10.0 (L) 03/26/2016 1333   HCT 29.3 (L) 07/02/2016 0543   HCT 29.7 (L) 03/26/2016 1333   PLT 330 07/02/2016 0543   PLT 133 (L) 03/26/2016 1333   MCV 93.0 07/02/2016 0543    MCV 96.7 03/26/2016 1333   MCH 30.8 07/02/2016 0543   MCHC 33.1 07/02/2016 0543   RDW 18.5 (H) 07/02/2016 0543   RDW 14.3 03/26/2016 1333   LYMPHSABS 0.8 07/01/2016 0625   LYMPHSABS 0.3 (L) 03/26/2016 1333   MONOABS 0.9 07/01/2016 0625   MONOABS 0.5 03/26/2016 1333   EOSABS 0.0 07/01/2016 0625   EOSABS 0.1 03/26/2016 1333   BASOSABS 0.0 07/01/2016 0625   BASOSABS 0.0 03/26/2016 1333    BMET    Component Value Date/Time   NA 135 07/02/2016 0543   NA 133 (L) 03/26/2016 1333   K 4.2 07/02/2016 0543   K 4.0 03/26/2016 1333   CL 99 (L) 07/02/2016 0543   CO2 25 07/02/2016 0543   CO2 21 (L) 03/26/2016 1333   GLUCOSE 110 (H) 07/02/2016 0543   GLUCOSE 134 03/26/2016 1333   BUN 37 (H) 07/02/2016 0543   BUN 18.3 03/26/2016 1333   CREATININE 1.16 07/02/2016 0543   CREATININE 1.1 03/26/2016 1333   CALCIUM 9.4 07/02/2016 0543   CALCIUM 10.8 (H) 03/26/2016 1333   GFRNONAA 55 (L) 07/02/2016 0543   GFRAA >60 07/02/2016 0543    BNP    Component Value Date/Time   BNP 223.0 (H) 06/29/2016 1435    ProBNP    Component Value Date/Time   PROBNP 296.0 (H) 11/27/2015 1621    Imaging: Dg Chest 2 View  Result Date: 06/29/2016 CLINICAL DATA:  Shortness of Breath.  History of prostate carcinoma EXAM: CHEST  2 VIEW COMPARISON:  Chest radiograph June 02, 2016 and chest CT December 2017 2017. FINDINGS: There is cardiomegaly with pulmonary venous hypertension. Pacemaker leads are attached to the right atrium and right ventricle. Patient is status post coronary artery bypass grafting. There are pleural effusions bilaterally. There is patchy interstitial and alveolar edema in both mid and lower lung zones. There is aortic  atherosclerosis. There is degenerative change in the thoracic spine. No adenopathy. No blastic or lytic bone lesions. IMPRESSION: Findings consistent with a degree of congestive heart failure. Superimposed bibasilar pneumonia cannot be excluded radiographically. Electronically  Signed   By: Lowella Grip III M.D.   On: 06/29/2016 15:13   Dg Chest Port 1 View  Result Date: 07/01/2016 CLINICAL DATA:  Shortness of breath and wheezing EXAM: PORTABLE CHEST 1 VIEW COMPARISON:  06/29/2016 FINDINGS: Cardiac shadow is mildly enlarged but stable. Pacing device is again seen and stable. Postsurgical changes are again noted. Lungs are well aerated bilaterally. The bibasilar infiltrative changes have improved significantly in the interval. Persistent interstitial edema is noted. No acute bony abnormality is noted. IMPRESSION: Improved aeration bilaterally with evidence of persistent interstitial edema. Electronically Signed   By: Inez Catalina M.D.   On: 07/01/2016 08:04     Assessment & Plan:   COPD mixed type ([place]) Frequent flare recently (w/ RSV/Parainfluenza and associated PNA )  Add ANORO to see if any improvement .   Plan  Patient Instructions  Change CPAP to auto set 5-15 .  Begin ANORO 1 puff daily     Chronic diastolic heart failure (HCC) Recent decompensation now improving .  Cont on current regimen   Chronic respiratory failure (HCC) Cont on O2 2l/m and 3l/m walking       [employer], NP 07/21/2016

## 2016-07-21 NOTE — Addendum Note (Signed)
Addended by: Len Blalock on: 07/21/2016 03:05 PM   Modules accepted: Orders

## 2016-07-21 NOTE — Patient Instructions (Addendum)
Change CPAP to auto set 5-15 .  Begin ANORO 1 puff daily  Continue on O2 2l/m rest and 3l/m activity  follow up Dr. Annamaria Boots  In 4 weeks and As needed   Please contact office for sooner follow up if symptoms do not improve or worsen or seek emergency care

## 2016-07-21 NOTE — Assessment & Plan Note (Signed)
Recent decompensation now improving .  Cont on current regimen

## 2016-07-21 NOTE — Assessment & Plan Note (Signed)
Cont on O2 2l/m and 3l/m walking

## 2016-07-21 NOTE — Patient Outreach (Signed)
Eric [name]) Care Management  07/21/2016  Eric [name] December 14, 1926 [id number]  CSW was able to make contact with patient today to follow-up regarding social work services and resources, as well as to ensure that home health services are in place for patient.  Patient admits that home health therapy services have been resumed with [facility].  Patient admits to already feeling stronger and less fatigued.  Patient is no longer interested in placement in a skilled nursing facility for short-term rehabilitative services. CSW will perform a case closure on patient, as all goals of treatment have been met from social work standpoint and no additional social work needs have been identified at this time.  CSW will notify patient's Geriatric Nurse Practitioner, [name] with [facility], of CSW's plans to close patient's case.  CSW will fax an update to patient's Primary Care Physician, [name] to ensure that they are aware of CSW's involvement with patient's plan of care.  CSW will submit a case closure request to Josepha Pigg, Care Management Assistant with [facility], in the form of an [employer].  CSW will ensure that [name] is aware of [name], RNCM with [facility], continued involvement with patient's care. [name], BSW, MSW, LCSW  Licensed Education officer, [facility]  Mailing [place] [address] [place], [place] [address] Physical Address-[address], [place], [place] [address] Toll Free Main # [phone] Fax # [phone] Cell # [phone]  Office # [phone] [email or web address]

## 2016-07-21 NOTE — Assessment & Plan Note (Addendum)
Frequent flare recently (w/ RSV/Parainfluenza and associated PNA )  Add ANORO to see if any improvement .   Plan  Patient Instructions  Change CPAP to auto set 5-15 .  Begin ANORO 1 puff daily  Continue on O2 2l/m rest and 3l/m activity  follow up Dr. Annamaria Boots  In 4 weeks and As needed   Please contact office for sooner follow up if symptoms do not improve or worsen or seek emergency care

## 2016-07-21 NOTE — Assessment & Plan Note (Signed)
Cont on CPAP , change to auto set 5-15  Check download on return.

## 2016-07-22 ENCOUNTER — Other Ambulatory Visit: Payer: Self-pay | Admitting: *Deleted

## 2016-07-22 DIAGNOSIS — C9210 Chronic myeloid leukemia, BCR/ABL-positive, not having achieved remission: Secondary | ICD-10-CM | POA: Diagnosis not present

## 2016-07-22 DIAGNOSIS — J441 Chronic obstructive pulmonary disease with (acute) exacerbation: Secondary | ICD-10-CM | POA: Diagnosis not present

## 2016-07-22 DIAGNOSIS — I5032 Chronic diastolic (congestive) heart failure: Secondary | ICD-10-CM | POA: Diagnosis not present

## 2016-07-22 DIAGNOSIS — F411 Generalized anxiety disorder: Secondary | ICD-10-CM

## 2016-07-22 DIAGNOSIS — E0965 Drug or chemical induced diabetes mellitus with hyperglycemia: Secondary | ICD-10-CM | POA: Diagnosis not present

## 2016-07-22 DIAGNOSIS — I2510 Atherosclerotic heart disease of native coronary artery without angina pectoris: Secondary | ICD-10-CM | POA: Diagnosis not present

## 2016-07-22 DIAGNOSIS — I110 Hypertensive heart disease with heart failure: Secondary | ICD-10-CM | POA: Diagnosis not present

## 2016-07-22 MED ORDER — LORAZEPAM 0.5 MG PO TABS: 0.5000 mg | ORAL_TABLET | Freq: Two times a day (BID) | ORAL | Status: DC

## 2016-07-22 NOTE — Patient Outreach (Signed)
[facility]) Care Management  02-Sep-202018  Eric [name] 1926/06/22 [id number]  Routine home visit in lieu of a transition of care call. Pt is very SOB today. He reports he slept poorly. He is also congested and coughing up a lot of mucous. He continues to have some nose bleeding every morning, very scant. He went to see pulmonology yesterday and got a new inhaler Anoro 62.5/25 mcg. He has not used his nebs today. He also becomes so very anxious when talking about things that bother him.   O: BP 130/70 (BP Location: Right Arm, Patient Position: Sitting, Cuff Size: Normal)   Pulse 74   Resp (!) 22   Wt 207 lb (93.9 kg)   SpO2 94%   BMI 33.41 kg/m       RRR      Lungs are clear      No edema  A:  Anxiety      COPD       CHF       Anemia  P:  RX given for lorazepam 0.5 mg bid, #60. Advised pt this medication can make him sleepy so he should exercise caution when walking and driving.       Advised to use his nebulizer routinely.       Will request that Oren Section will provide a couple of hours of assistance weekly.       MOST form completed! DNR.  [name] [facility] [phone]

## 2016-07-23 ENCOUNTER — Telehealth: Payer: Self-pay | Admitting: Internal Medicine

## 2016-07-23 DIAGNOSIS — J441 Chronic obstructive pulmonary disease with (acute) exacerbation: Secondary | ICD-10-CM | POA: Diagnosis not present

## 2016-07-23 DIAGNOSIS — I2510 Atherosclerotic heart disease of native coronary artery without angina pectoris: Secondary | ICD-10-CM | POA: Diagnosis not present

## 2016-07-23 DIAGNOSIS — I5032 Chronic diastolic (congestive) heart failure: Secondary | ICD-10-CM | POA: Diagnosis not present

## 2016-07-23 DIAGNOSIS — E0965 Drug or chemical induced diabetes mellitus with hyperglycemia: Secondary | ICD-10-CM | POA: Diagnosis not present

## 2016-07-23 DIAGNOSIS — C9210 Chronic myeloid leukemia, BCR/ABL-positive, not having achieved remission: Secondary | ICD-10-CM | POA: Diagnosis not present

## 2016-07-23 DIAGNOSIS — I110 Hypertensive heart disease with heart failure: Secondary | ICD-10-CM | POA: Diagnosis not present

## 2016-07-23 NOTE — Telephone Encounter (Signed)
Verbal Orders for OT  2xa week for 2 weeks 1x a week for 1 week  [phone] - Will

## 2016-07-24 ENCOUNTER — Telehealth: Payer: Self-pay | Admitting: Adult Health

## 2016-07-24 NOTE — Telephone Encounter (Signed)
These orders were sent to [facility] on 07/21/16. lmtcb x1 for pt.

## 2016-07-24 NOTE — Telephone Encounter (Signed)
Called will and and gave verbal ok

## 2016-07-24 NOTE — Telephone Encounter (Signed)
Fine

## 2016-07-27 DIAGNOSIS — C9210 Chronic myeloid leukemia, BCR/ABL-positive, not having achieved remission: Secondary | ICD-10-CM | POA: Diagnosis not present

## 2016-07-27 DIAGNOSIS — I5032 Chronic diastolic (congestive) heart failure: Secondary | ICD-10-CM | POA: Diagnosis not present

## 2016-07-27 DIAGNOSIS — E0965 Drug or chemical induced diabetes mellitus with hyperglycemia: Secondary | ICD-10-CM | POA: Diagnosis not present

## 2016-07-27 DIAGNOSIS — I110 Hypertensive heart disease with heart failure: Secondary | ICD-10-CM | POA: Diagnosis not present

## 2016-07-27 DIAGNOSIS — J441 Chronic obstructive pulmonary disease with (acute) exacerbation: Secondary | ICD-10-CM | POA: Diagnosis not present

## 2016-07-27 DIAGNOSIS — I2510 Atherosclerotic heart disease of native coronary artery without angina pectoris: Secondary | ICD-10-CM | POA: Diagnosis not present

## 2016-07-27 NOTE — Telephone Encounter (Signed)
Patient returned call.  Advised him order was sent over on 07/21/2016.  He stated he did not need a call back unless we needed further information.

## 2016-07-29 ENCOUNTER — Other Ambulatory Visit: Payer: Self-pay | Admitting: *Deleted

## 2016-07-29 DIAGNOSIS — E0965 Drug or chemical induced diabetes mellitus with hyperglycemia: Secondary | ICD-10-CM | POA: Diagnosis not present

## 2016-07-29 DIAGNOSIS — I2510 Atherosclerotic heart disease of native coronary artery without angina pectoris: Secondary | ICD-10-CM | POA: Diagnosis not present

## 2016-07-29 DIAGNOSIS — I5032 Chronic diastolic (congestive) heart failure: Secondary | ICD-10-CM | POA: Diagnosis not present

## 2016-07-29 DIAGNOSIS — C9210 Chronic myeloid leukemia, BCR/ABL-positive, not having achieved remission: Secondary | ICD-10-CM | POA: Diagnosis not present

## 2016-07-29 DIAGNOSIS — I110 Hypertensive heart disease with heart failure: Secondary | ICD-10-CM | POA: Diagnosis not present

## 2016-07-29 DIAGNOSIS — J441 Chronic obstructive pulmonary disease with (acute) exacerbation: Secondary | ICD-10-CM | POA: Diagnosis not present

## 2016-07-29 NOTE — Patient Outreach (Signed)
Transition of care call. Pt reports he is taking the lorazepam and he can't tell any difference. He reports his appetite has returned and he is eating more regularly. His weight today was 211 which is a gain of 3# since my visit last week. He says he has minimal edema and no more SOB than usual.  I have encouraged him to take the lorazepam routinely. I have reinforced for him to call me if he has any problems. We agreed to have a home visit next week 08/05/16 at 4:00 pm.  [name] [facility] [phone]

## 2016-07-30 DIAGNOSIS — I2510 Atherosclerotic heart disease of native coronary artery without angina pectoris: Secondary | ICD-10-CM | POA: Diagnosis not present

## 2016-07-30 DIAGNOSIS — C9210 Chronic myeloid leukemia, BCR/ABL-positive, not having achieved remission: Secondary | ICD-10-CM | POA: Diagnosis not present

## 2016-07-30 DIAGNOSIS — J441 Chronic obstructive pulmonary disease with (acute) exacerbation: Secondary | ICD-10-CM | POA: Diagnosis not present

## 2016-07-30 DIAGNOSIS — I110 Hypertensive heart disease with heart failure: Secondary | ICD-10-CM | POA: Diagnosis not present

## 2016-07-30 DIAGNOSIS — E0965 Drug or chemical induced diabetes mellitus with hyperglycemia: Secondary | ICD-10-CM | POA: Diagnosis not present

## 2016-07-30 DIAGNOSIS — I5032 Chronic diastolic (congestive) heart failure: Secondary | ICD-10-CM | POA: Diagnosis not present

## 2016-07-31 DIAGNOSIS — E0965 Drug or chemical induced diabetes mellitus with hyperglycemia: Secondary | ICD-10-CM | POA: Diagnosis not present

## 2016-07-31 DIAGNOSIS — I2510 Atherosclerotic heart disease of native coronary artery without angina pectoris: Secondary | ICD-10-CM | POA: Diagnosis not present

## 2016-07-31 DIAGNOSIS — I5032 Chronic diastolic (congestive) heart failure: Secondary | ICD-10-CM | POA: Diagnosis not present

## 2016-07-31 DIAGNOSIS — I110 Hypertensive heart disease with heart failure: Secondary | ICD-10-CM | POA: Diagnosis not present

## 2016-07-31 DIAGNOSIS — J441 Chronic obstructive pulmonary disease with (acute) exacerbation: Secondary | ICD-10-CM | POA: Diagnosis not present

## 2016-07-31 DIAGNOSIS — C9210 Chronic myeloid leukemia, BCR/ABL-positive, not having achieved remission: Secondary | ICD-10-CM | POA: Diagnosis not present

## 2016-08-03 DIAGNOSIS — I5032 Chronic diastolic (congestive) heart failure: Secondary | ICD-10-CM | POA: Diagnosis not present

## 2016-08-03 DIAGNOSIS — J441 Chronic obstructive pulmonary disease with (acute) exacerbation: Secondary | ICD-10-CM | POA: Diagnosis not present

## 2016-08-03 DIAGNOSIS — C9210 Chronic myeloid leukemia, BCR/ABL-positive, not having achieved remission: Secondary | ICD-10-CM | POA: Diagnosis not present

## 2016-08-03 DIAGNOSIS — I2510 Atherosclerotic heart disease of native coronary artery without angina pectoris: Secondary | ICD-10-CM | POA: Diagnosis not present

## 2016-08-03 DIAGNOSIS — I110 Hypertensive heart disease with heart failure: Secondary | ICD-10-CM | POA: Diagnosis not present

## 2016-08-03 DIAGNOSIS — E0965 Drug or chemical induced diabetes mellitus with hyperglycemia: Secondary | ICD-10-CM | POA: Diagnosis not present

## 2016-08-04 ENCOUNTER — Ambulatory Visit (HOSPITAL_BASED_OUTPATIENT_CLINIC_OR_DEPARTMENT_OTHER): Payer: Medicare Other | Admitting: Hematology and Oncology

## 2016-08-04 ENCOUNTER — Telehealth: Payer: Self-pay | Admitting: Hematology and Oncology

## 2016-08-04 ENCOUNTER — Other Ambulatory Visit (HOSPITAL_BASED_OUTPATIENT_CLINIC_OR_DEPARTMENT_OTHER): Payer: Medicare Other

## 2016-08-04 ENCOUNTER — Encounter: Payer: Self-pay | Admitting: Hematology and Oncology

## 2016-08-04 DIAGNOSIS — D630 Anemia in neoplastic disease: Secondary | ICD-10-CM

## 2016-08-04 DIAGNOSIS — J449 Chronic obstructive pulmonary disease, unspecified: Secondary | ICD-10-CM

## 2016-08-04 DIAGNOSIS — R05 Cough: Secondary | ICD-10-CM | POA: Diagnosis not present

## 2016-08-04 DIAGNOSIS — C9210 Chronic myeloid leukemia, BCR/ABL-positive, not having achieved remission: Secondary | ICD-10-CM

## 2016-08-04 DIAGNOSIS — Z7189 Other specified counseling: Secondary | ICD-10-CM

## 2016-08-04 LAB — COMPREHENSIVE METABOLIC PANEL
ALBUMIN: 3.5 g/dL (ref 3.5–5.0)
ALK PHOS: 61 U/L (ref 40–150)
ALT: 11 U/L (ref 0–55)
AST: 18 U/L (ref 5–34)
Anion Gap: 9 mEq/L (ref 3–11)
BUN: 18 mg/dL (ref 7.0–26.0)
CO2: 24 mEq/L (ref 22–29)
Calcium: 9.7 mg/dL (ref 8.4–10.4)
Chloride: 103 mEq/L (ref 98–109)
Creatinine: 1 mg/dL (ref 0.7–1.3)
EGFR: 64 mL/min/{1.73_m2} — AB (ref >90)
GLUCOSE: 150 mg/dL — AB (ref 70–140)
Potassium: 4.1 mEq/L (ref 3.5–5.1)
SODIUM: 136 meq/L (ref 136–145)
Total Bilirubin: 0.54 mg/dL (ref 0.20–1.20)
Total Protein: 7.3 g/dL (ref 6.4–8.3)

## 2016-08-04 LAB — CBC WITH DIFFERENTIAL/PLATELET
BASO%: 0 % (ref 0.0–2.0)
Basophils Absolute: 0 10*3/uL (ref 0.0–0.1)
EOS ABS: 0.2 10*3/uL (ref 0.0–0.5)
EOS%: 2.9 % (ref 0.0–7.0)
HCT: 28.1 % — ABNORMAL LOW (ref 38.4–49.9)
HEMOGLOBIN: 9.2 g/dL — AB (ref 13.0–17.1)
LYMPH#: 1 10*3/uL (ref 0.9–3.3)
LYMPH%: 17.1 % (ref 14.0–49.0)
MCH: 31.2 pg (ref 27.2–33.4)
MCHC: 32.7 g/dL (ref 32.0–36.0)
MCV: 95.3 fL (ref 79.3–98.0)
MONO#: 0.9 10*3/uL (ref 0.1–0.9)
MONO%: 14.4 % — AB (ref 0.0–14.0)
NEUT%: 65.6 % (ref 39.0–75.0)
NEUTROS ABS: 3.9 10*3/uL (ref 1.5–6.5)
Platelets: 122 10*3/uL — ABNORMAL LOW (ref 140–400)
RBC: 2.95 10*6/uL — ABNORMAL LOW (ref 4.20–5.82)
RDW: 16.5 % — ABNORMAL HIGH (ref 11.0–14.6)
WBC: 5.9 10*3/uL (ref 4.0–10.3)

## 2016-08-04 NOTE — Progress Notes (Signed)
[place] OFFICE PROGRESS NOTE  Patient Care Team: [name], MD as PCP - General (Internal Medicine) Belva Crome, MD as Consulting Physician (Cardiology) Suella Broad, MD as Consulting Physician (Physical Medicine and Rehabilitation) Deboraha Sprang, MD as Consulting Physician (Cardiology) Gatha Mayer, MD as Consulting Physician (Gastroenterology) Irine Seal, MD as Consulting Physician (Urology) Melida Quitter, MD as Consulting Physician (Otolaryngology) Susa Day, MD (Orthopedic Surgery) Deneise Lever, MD (Pulmonary Disease) Carol Ada, MD (Gastroenterology) Allyn Kenner, MD (Dermatology) [name], NP as [place] Management  SUMMARY OF ONCOLOGIC HISTORY:   CML (chronic myeloid leukemia) ([place])   06/19/2015 Pathology Results    Peripheral blood was positive for BCR/ABL at 45.49% a1a2 and on IS 37.76%      06/20/2015 Bone Marrow Biopsy    Accession: [id number] BM biopsy confirmed CML. 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 Results of the tumor marker test revealed:Peripheral blood was positive for BCR/ABL at 3.08% a1a2 and on IS 2.55%      03/26/2016 Pathology Results    Peripheral blood was positive for BCR/ABL at 0.78% a1a2 and on IS 0.6474%       INTERVAL HISTORY: Please see below for problem oriented charting. The patient had recurrent admission to the hospital. I saw him in the hospital recently and prescribed 2 units of blood transfusion. Since then, he was discharged home with home oxygen therapy and close follow-up  with multiple doctors. He continues to have chronic cough especially in the morning.  He denies diarrhea. No recent fever or chills. The patient denies any recent signs or symptoms of bleeding such as spontaneous epistaxis, hematuria or hematochezia.   REVIEW OF SYSTEMS:   Constitutional: Denies fevers, chills or abnormal weight loss Eyes: Denies blurriness of vision Ears, nose, mouth, throat, and face: Denies mucositis or sore throat Cardiovascular: Denies palpitation, chest discomfort or lower extremity swelling Gastrointestinal:  Denies nausea, heartburn or change in bowel habits Skin: Denies abnormal skin rashes Lymphatics: Denies new lymphadenopathy or easy bruising Neurological:Denies numbness, tingling or new weaknesses Behavioral/Psych: Mood is stable, no new changes  All other systems were reviewed with the patient and are negative.  I have reviewed the past medical history, past surgical history, social history and family history with the patient and they are unchanged from previous note.  ALLERGIES:  is allergic to actos [pioglitazone hydrochloride]; buprenorphine hcl; celebrex [celecoxib]; demerol; meperidine; morphine and related; ciprofloxacin; metformin; and zocor [simvastatin].  MEDICATIONS:  Current Outpatient Prescriptions  Medication Sig Dispense Refill  . acetaminophen (TYLENOL) 500 MG tablet Take 1,000 mg by mouth every 6 (six) hours as needed for mild pain or fever.    [name] albuterol (PROVENTIL HFA) 108 (90 Base) MCG/ACT inhaler Inhale 2 puffs into the lungs every 4 (four) hours as needed for shortness of breath. Wheezing 1 Inhaler 1  . albuterol (PROVENTIL) (2.5 MG/3ML) 0.083% nebulizer solution USE 1 VIAL WITH NEBULIZER EVERY 4 HOURS AS NEEDED FOR WHEEZING  75 mL 2  . albuterol-ipratropium (COMBIVENT) 18-103 MCG/ACT inhaler Inhale 1 puff into the lungs every 6 (six) hours as needed for wheezing or shortness of breath.    [name] aspirin 81 MG tablet Take 81 mg by mouth  every evening.     . feeding supplement, ENSURE ENLIVE, (ENSURE ENLIVE) LIQD Take 237 mLs by mouth 2 (two) times daily between meals. 90 Bottle 11  . fluticasone (FLONASE) 50 MCG/ACT nasal spray Place 2 sprays into both nostrils daily. Allergies (Patient taking differently: Place 2 sprays into both nostrils 2 (two) times daily as needed for allergies. Allergies) 16 g 1  . furosemide (LASIX) 40 MG tablet Take 1 tablet (40 mg total) by mouth daily. 90 tablet 1  . guaiFENesin-dextromethorphan (ROBITUSSIN DM) 100-10 MG/5ML syrup Take 5 mLs by mouth every 4 (four) hours as needed for cough. 118 mL 0  . HYDROcodone-acetaminophen (NORCO) 7.5-325 MG tablet Take 1 tablet by mouth every 6 (six) hours as needed for moderate pain.    . nitroGLYCERIN (NITROSTAT) 0.4 MG SL tablet Place 0.4 mg under the tongue every 5 (five) minutes as needed for chest pain. Reported on 12/12/2015    . Polyethyl Glycol-Propyl Glycol (SYSTANE) 0.4-0.3 % SOLN Apply 2 drops to eye 3 (three) times daily as needed (dry eyes).     . pravastatin (PRAVACHOL) 20 MG tablet Take 3 tablets (60 mg total) by mouth daily. 270 tablet 3  . ranitidine (ZANTAC) 300 MG tablet Take 300 mg by mouth at bedtime.     . triamcinolone cream (KENALOG) 0.1 % Apply 1 application topically 3 (three) times daily as needed (dry skin.).   3  . umeclidinium-vilanterol (ANORO ELLIPTA) 62.5-25 MCG/INH AEPB Inhale 1 puff into the lungs daily. 2 each 0  . valsartan (DIOVAN) 160 MG tablet Take 1 tablet (160 mg total) by mouth daily. (Patient taking differently: Take 160 mg by mouth 2 (two) times daily. ) 90 tablet 3   Current Facility-Administered Medications  Medication Dose Route Frequency Provider Last Rate Last Dose  . LORazepam (ATIVAN) tablet 0.5 mg  0.5 mg Oral BID [name], NP        PHYSICAL EXAMINATION: ECOG PERFORMANCE STATUS: 2 - Symptomatic, <50% confined to bed  Vitals:   08/04/16 1051  BP: (!) 142/47  Pulse: 80  Resp: 19  Temp: 98.8 F (37.1  C)   Filed Weights   08/04/16 1051  Weight: 222 lb 14.4 oz (101.1 kg)    GENERAL:alert, no distress and comfortable SKIN: skin color, texture, turgor are normal, no rashes or significant lesions EYES: normal, Conjunctiva are pink and non-injected, sclera clear OROPHARYNX:no exudate, no erythema and lips, buccal mucosa, and tongue normal  NECK: supple, thyroid normal size, non-tender, without nodularity LYMPH:  no palpable lymphadenopathy in the cervical, axillary or inguinal LUNGS: He has significant expiratory wheezes bilaterally.  Noted increased breathing effort.  He is on oxygen HEART: regular rate & rhythm and no murmurs and no lower extremity edema ABDOMEN:abdomen soft, non-tender and normal bowel sounds Musculoskeletal:no cyanosis of digits and no clubbing  NEURO: alert & oriented x 3 with fluent speech, no focal motor/sensory deficits  LABORATORY DATA:  I have reviewed the data as listed    Component Value Date/Time   NA 135 07/02/2016 0543   NA 133 (L) 03/26/2016 1333   K 4.2 07/02/2016 0543   K 4.0 03/26/2016 1333   CL 99 (L) 07/02/2016 0543   CO2 25 07/02/2016 0543   CO2 21 (L)  03/26/2016 1333   GLUCOSE 110 (H) 07/02/2016 0543   GLUCOSE 134 03/26/2016 1333   BUN 37 (H) 07/02/2016 0543   BUN 18.3 03/26/2016 1333   CREATININE 1.16 07/02/2016 0543   CREATININE 1.1 03/26/2016 1333   CALCIUM 9.4 07/02/2016 0543   CALCIUM 10.8 (H) 03/26/2016 1333   PROT 7.4 07/02/2016 0543   PROT 7.2 03/26/2016 1333   ALBUMIN 3.5 07/02/2016 0543   ALBUMIN 3.7 03/26/2016 1333   AST 39 07/02/2016 0543   AST 21 03/26/2016 1333   ALT 31 07/02/2016 0543   ALT 13 03/26/2016 1333   ALKPHOS 62 07/02/2016 0543   ALKPHOS 81 03/26/2016 1333   BILITOT 0.7 07/02/2016 0543   BILITOT 0.58 03/26/2016 1333   GFRNONAA 55 (L) 07/02/2016 0543   GFRAA >60 07/02/2016 0543    No results found for: SPEP, UPEP  Lab Results  Component Value Date   WBC 5.9 08/04/2016   NEUTROABS 3.9 08/04/2016    HGB 9.2 (L) 08/04/2016   HCT 28.1 (L) 08/04/2016   MCV 95.3 08/04/2016   PLT 122 (L) 08/04/2016      Chemistry      Component Value Date/Time   NA 135 07/02/2016 0543   NA 133 (L) 03/26/2016 1333   K 4.2 07/02/2016 0543   K 4.0 03/26/2016 1333   CL 99 (L) 07/02/2016 0543   CO2 25 07/02/2016 0543   CO2 21 (L) 03/26/2016 1333   BUN 37 (H) 07/02/2016 0543   BUN 18.3 03/26/2016 1333   CREATININE 1.16 07/02/2016 0543   CREATININE 1.1 03/26/2016 1333   GLU 158 04/11/2014      Component Value Date/Time   CALCIUM 9.4 07/02/2016 0543   CALCIUM 10.8 (H) 03/26/2016 1333   ALKPHOS 62 07/02/2016 0543   ALKPHOS 81 03/26/2016 1333   AST 39 07/02/2016 0543   AST 21 03/26/2016 1333   ALT 31 07/02/2016 0543   ALT 13 03/26/2016 1333   BILITOT 0.7 07/02/2016 0543   BILITOT 0.58 03/26/2016 1333      ASSESSMENT & PLAN:  CML (chronic myeloid leukemia) ([place]) The patient tolerated treatment poorly with pancytopenia, diarrhea and fatigue. He had recurrent admissions to the hospital due to pneumonia and COPD exacerbation His last blood count and BCR/ABL by PCR techniques showed it is still detectable at slightly under 1%. His treatment was recently put on hold, resumed recently without problems We will resume treatment and continue to monitor his blood counts carefully  Anemia in neoplastic disease This is likely due to recent treatment. The patient denies recent history of bleeding such as epistaxis, hematuria or hematochezia. He is asymptomatic from the anemia. I will observe for now.  COPD mixed type [facility]) He has significant COPD and is wheezing on exam. He is using continuous oxygen as prescribed by his pulmonologist. He is miserable because of the oxygen. I recommend close follow-up with pulmonologist.  Goals of care, counseling/discussion The patient has recurrent hospitalization. He has advanced directive/living will. We discussed briefly about possibility of hospice enrollment  but the patient declined.   No orders of the defined types were placed in this encounter.  All questions were answered. The patient knows to call the clinic with any problems, questions or concerns. No barriers to learning was detected. I spent 15 minutes counseling the patient face to face. The total time spent in the appointment was 25 minutes and more than 50% was on counseling and review of test results     Heath Lark,  MD 08/04/2016 11:03 AM

## 2016-08-04 NOTE — Telephone Encounter (Signed)
Appointments scheduled per 08/04/16 los. Patient was given a copy of the AVS report and appointment schedule per 08/04/16 los. °

## 2016-08-04 NOTE — Assessment & Plan Note (Signed)
This is likely due to recent treatment. The patient denies recent history of bleeding such as epistaxis, hematuria or hematochezia. He is asymptomatic from the anemia. I will observe for now.    

## 2016-08-04 NOTE — Assessment & Plan Note (Signed)
He has significant COPD and is wheezing on exam. He is using continuous oxygen as prescribed by his pulmonologist. He is miserable because of the oxygen. I recommend close follow-up with pulmonologist.

## 2016-08-04 NOTE — Assessment & Plan Note (Addendum)
The patient tolerated treatment poorly with pancytopenia, diarrhea and fatigue. He had recurrent admissions to the hospital due to pneumonia and COPD exacerbation His last blood count and BCR/ABL by PCR techniques showed it is still detectable at slightly under 1%. His treatment was recently put on hold, resumed recently without problems We will resume treatment and continue to monitor his blood counts carefully

## 2016-08-04 NOTE — Assessment & Plan Note (Signed)
The patient has recurrent hospitalization. He has advanced directive/living will. We discussed briefly about possibility of hospice enrollment but the patient declined.

## 2016-08-05 ENCOUNTER — Other Ambulatory Visit: Payer: Self-pay | Admitting: *Deleted

## 2016-08-05 DIAGNOSIS — I110 Hypertensive heart disease with heart failure: Secondary | ICD-10-CM | POA: Diagnosis not present

## 2016-08-05 DIAGNOSIS — I2510 Atherosclerotic heart disease of native coronary artery without angina pectoris: Secondary | ICD-10-CM | POA: Diagnosis not present

## 2016-08-05 DIAGNOSIS — C9210 Chronic myeloid leukemia, BCR/ABL-positive, not having achieved remission: Secondary | ICD-10-CM | POA: Diagnosis not present

## 2016-08-05 DIAGNOSIS — E0965 Drug or chemical induced diabetes mellitus with hyperglycemia: Secondary | ICD-10-CM | POA: Diagnosis not present

## 2016-08-05 DIAGNOSIS — J441 Chronic obstructive pulmonary disease with (acute) exacerbation: Secondary | ICD-10-CM | POA: Diagnosis not present

## 2016-08-05 DIAGNOSIS — I5032 Chronic diastolic (congestive) heart failure: Secondary | ICD-10-CM | POA: Diagnosis not present

## 2016-08-05 NOTE — Patient Outreach (Signed)
[facility]) Care Management   08/05/2016  Eric [name] Nov 22, 1926 [id number]  Eric [name] is an 83 y.o. male  Subjective: Pt reports he doesn't feel well. He says he saw Dr. Lottie Rater yesterday. She asked if he was ready for Hospice. He tells me he thinks he is ready. He definitely does not ever want to have home health services at all. He reports he has a lot of mucous that he has to cough up, he also had some blood. He complains that his daughters don't come around or take anything seriously. He doesw not feel the Anoro inhaler has made a difference. He does not want it to be renewed. He says his nebulizer and albuterol inhaler help him most.  Objective: BP (!) 130/50 (BP Location: Right Arm, Patient Position: Sitting, Cuff Size: Normal)   Pulse 60   Resp 18   Wt 212 lb (96.2 kg)   SpO2 94%   BMI 34.22 kg/m  RRR Lungs with some inspiratory and expiratory wheezes Trace edmema  Review of Systems  Constitutional: Positive for malaise/fatigue.  HENT: Negative.   Eyes: Negative.   Respiratory: Positive for cough, hemoptysis, sputum production, shortness of breath and wheezing.   Cardiovascular: Positive for orthopnea.  Gastrointestinal: Positive for abdominal pain.  Genitourinary: Negative.   Musculoskeletal: Negative.   Skin: Negative.   Neurological: Positive for weakness.    Physical Exam  Constitutional: He is oriented to person, place, and time. He appears well-developed and well-nourished.  HENT:  Head: Normocephalic and atraumatic.  Cardiovascular: Normal rate, regular rhythm and normal heart sounds.   Weight is up 6 pounds.  Respiratory: He has wheezes.  GI: Soft. Bowel sounds are normal.  Neurological: He is alert and oriented to person, place, and time.  Skin: Skin is warm and dry.  Psychiatric:  Depressed.    Encounter Medications:   Outpatient Encounter Prescriptions as of 08/05/2016  Medication Sig Note  . acetaminophen (TYLENOL) 500  MG tablet Take 1,000 mg by mouth every 6 (six) hours as needed for mild pain or fever.   [name] albuterol (PROVENTIL HFA) 108 (90 Base) MCG/ACT inhaler Inhale 2 puffs into the lungs every 4 (four) hours as needed for shortness of breath. Wheezing   . albuterol (PROVENTIL) (2.5 MG/3ML) 0.083% nebulizer solution USE 1 VIAL WITH NEBULIZER EVERY 4 HOURS AS NEEDED FOR WHEEZING   . albuterol-ipratropium (COMBIVENT) 18-103 MCG/ACT inhaler Inhale 1 puff into the lungs every 6 (six) hours as needed for wheezing or shortness of breath.   [name] aspirin 81 MG tablet Take 81 mg by mouth every evening.    . feeding supplement, ENSURE ENLIVE, (ENSURE ENLIVE) LIQD Take 237 mLs by mouth 2 (two) times daily between meals.   . fluticasone (FLONASE) 50 MCG/ACT nasal spray Place 2 sprays into both nostrils daily. Allergies (Patient taking differently: Place 2 sprays into both nostrils 2 (two) times daily as needed for allergies. Allergies)   . furosemide (LASIX) 40 MG tablet Take 1 tablet (40 mg total) by mouth daily.   [name] guaiFENesin-dextromethorphan (ROBITUSSIN DM) 100-10 MG/5ML syrup Take 5 mLs by mouth every 4 (four) hours as needed for cough.   [name] HYDROcodone-acetaminophen (NORCO) 7.5-325 MG tablet Take 1 tablet by mouth every 6 (six) hours as needed for moderate pain.   . nitroGLYCERIN (NITROSTAT) 0.4 MG SL tablet Place 0.4 mg under the tongue every 5 (five) minutes as needed for chest pain. Reported on 12/12/2015   . Polyethyl Glycol-Propyl Glycol (SYSTANE) 0.4-0.3 %  SOLN Apply 2 drops to eye 3 (three) times daily as needed (dry eyes).    . pravastatin (PRAVACHOL) 20 MG tablet Take 3 tablets (60 mg total) by mouth daily.   . ranitidine (ZANTAC) 300 MG tablet Take 300 mg by mouth at bedtime.    . triamcinolone cream (KENALOG) 0.1 % Apply 1 application topically 3 (three) times daily as needed (dry skin.).  06/29/2016: apply to arms and legs as needed for itching  . umeclidinium-vilanterol (ANORO ELLIPTA) 62.5-25 MCG/INH AEPB  Inhale 1 puff into the lungs daily.   . valsartan (DIOVAN) 160 MG tablet Take 1 tablet (160 mg total) by mouth daily. (Patient taking differently: Take 160 mg by mouth 2 (two) times daily. )    Facility-Administered Encounter Medications as of 08/05/2016  Medication  . LORazepam (ATIVAN) tablet 0.5 mg    Functional Status:   In your present state of health, do you have any difficulty performing the following activities: 07/10/2016 06/29/2016  Hearing? Tempie Donning  Vision? N N  Difficulty concentrating or making decisions? N N  Walking or climbing stairs? Y Y  Dressing or bathing? Y Y  Doing errands, shopping? Tempie Donning  Preparing Food and eating ? N -  Using the Toilet? N -  In the past six months, have you accidently leaked urine? N -  Do you have problems with loss of bowel control? N -  Managing your Medications? Y -  Managing your Finances? Y -  Housekeeping or managing your Housekeeping? Y -  Some recent data might be hidden    Fall/Depression Screening:    PHQ 2/9 Scores 07/10/2016 06/23/2016 05/27/2015 05/23/2014  PHQ - 2 Score 0 0 0 1    Assessment:  COPD                         CHF                          Plan: Gave extra furosemide this evening and will check in by phone tomorrow to see how pt is feeling.           We discussed a Hospice referral and I gave him information on their services emphasizing the emotional support and focus on quality of life. He says                    he will consider this.             I will call one of his daughters to advise of declining health and need for some attention.  [facility] CM Care Plan Problem One   Flowsheet Row Most Recent Value  Care Plan Problem One  (P) 2 Episodes of COPD exacerbation requiring hospital admissions within 30 days.  Role Documenting the Problem One  (P) Care Management [place] for Problem One  (P) Active  [facility] Long Term Goal (31-90 days)  (P) Pt will not be rehospitalized in the next 60 days for COPD.  [facility] Long  Term Goal Start Date  (P) 06/09/16  Interventions for Problem One Long Term Goal  (P) Reminded pt that I am available to call for any problems.  [facility] CM Short Term Goal #1 (0-30 days)  (P) Pt will participate in weekly transition of care calls over the next 4 weeks.  [facility] CM Short Term Goal #1 Start Date  (P) 06/09/16  [facility] CM Short  Term Goal #1 Met Date  (P) 08/05/16  [facility] CM Short Term Goal #2 (0-30 days)  (P) Pt  does not have a MOST form in his home.  [facility] CM Short Term Goal #2 Start Date  (P) 07/22/16  [facility] CM Short Term Goal #2 Met Date  (P) 07/22/16  Interventions for Short Term Goal #2  (P) Completed form.    [facility] CM Care Plan Problem Two   Flowsheet Row Most Recent Value  Care Plan Problem Two  (P) Declining health, nearing end of life.  Role Documenting the Problem Two  (P) Care Management [place] for Problem Two  (P) Active      [name] [facility] [place] [phone]

## 2016-08-10 ENCOUNTER — Other Ambulatory Visit: Payer: Self-pay | Admitting: Hematology and Oncology

## 2016-08-10 ENCOUNTER — Other Ambulatory Visit: Payer: Self-pay | Admitting: *Deleted

## 2016-08-10 ENCOUNTER — Telehealth: Payer: Self-pay | Admitting: *Deleted

## 2016-08-10 MED ORDER — DASATINIB 70 MG PO TABS: 70.0000 mg | ORAL_TABLET | Freq: Every day | ORAL | 11 refills | Status: DC

## 2016-08-10 NOTE — Telephone Encounter (Signed)
Sprycel refill faxed to [facility]

## 2016-08-10 NOTE — Patient Outreach (Signed)
Eric [name] called me to report he is feeling pretty good now and that he wants to put off a Hospice referral. He also requests that I call his daughter [name] and discuss his health status , which I told him I will do.  Called [name] and left a message on her cell and home numbers to return my call to discuss her father's health and needs.  [name] [facility] [place] [phone]

## 2016-08-11 DIAGNOSIS — C9210 Chronic myeloid leukemia, BCR/ABL-positive, not having achieved remission: Secondary | ICD-10-CM | POA: Diagnosis not present

## 2016-08-11 DIAGNOSIS — I5032 Chronic diastolic (congestive) heart failure: Secondary | ICD-10-CM | POA: Diagnosis not present

## 2016-08-11 DIAGNOSIS — I2510 Atherosclerotic heart disease of native coronary artery without angina pectoris: Secondary | ICD-10-CM | POA: Diagnosis not present

## 2016-08-11 DIAGNOSIS — J441 Chronic obstructive pulmonary disease with (acute) exacerbation: Secondary | ICD-10-CM | POA: Diagnosis not present

## 2016-08-11 DIAGNOSIS — I110 Hypertensive heart disease with heart failure: Secondary | ICD-10-CM | POA: Diagnosis not present

## 2016-08-11 DIAGNOSIS — E0965 Drug or chemical induced diabetes mellitus with hyperglycemia: Secondary | ICD-10-CM | POA: Diagnosis not present

## 2016-08-17 ENCOUNTER — Other Ambulatory Visit: Payer: Self-pay | Admitting: *Deleted

## 2016-08-21 ENCOUNTER — Encounter: Payer: Self-pay | Admitting: Internal Medicine

## 2016-08-21 ENCOUNTER — Ambulatory Visit (INDEPENDENT_AMBULATORY_CARE_PROVIDER_SITE_OTHER): Payer: Medicare Other | Admitting: Internal Medicine

## 2016-08-21 DIAGNOSIS — I5032 Chronic diastolic (congestive) heart failure: Secondary | ICD-10-CM | POA: Diagnosis not present

## 2016-08-21 DIAGNOSIS — C9210 Chronic myeloid leukemia, BCR/ABL-positive, not having achieved remission: Secondary | ICD-10-CM | POA: Diagnosis not present

## 2016-08-21 DIAGNOSIS — Z933 Colostomy status: Secondary | ICD-10-CM | POA: Diagnosis not present

## 2016-08-21 DIAGNOSIS — I6523 Occlusion and stenosis of bilateral carotid arteries: Secondary | ICD-10-CM

## 2016-08-21 DIAGNOSIS — J449 Chronic obstructive pulmonary disease, unspecified: Secondary | ICD-10-CM | POA: Diagnosis not present

## 2016-08-21 MED ORDER — DIPHENOXYLATE-ATROPINE 2.5-0.025 MG PO TABS: 1.0000 | ORAL_TABLET | Freq: Four times a day (QID) | ORAL | 0 refills | Status: DC | PRN

## 2016-08-21 NOTE — Assessment & Plan Note (Signed)
His medications do cause side effects and the overall disease does impact on his functional status and QOL.

## 2016-08-21 NOTE — Assessment & Plan Note (Signed)
Having more loose stools and rx for lomitil sent in to see if this helps more.

## 2016-08-21 NOTE — Progress Notes (Signed)
Pre visit review using our clinic review tool, if applicable. No additional management support is needed unless otherwise documented below in the visit note. 

## 2016-08-21 NOTE — Progress Notes (Signed)
   Subjective:    Patient ID: Eric [name], male    DOB: 1927-01-14, 83 y.o.   MRN: [id number]  HPI The patient is an 83 YO man coming in for paperwork for the [place] to get assistance. He does have severe balance problems and many falls due to his COPD (with chronic respiratory failure), CML, and chronic diastolic heart failure. He is also having worsening diarrhea with his hartman pouch due to his sprycel. He has asked his oncologist but they are not able to change this at this time and he is only having partial response to it as well. He is taking imodium over the counter but this is not very helpful right now. It used to work very well but not working now.   Review of Systems  Constitutional: Positive for activity change, appetite change and fatigue. Negative for chills, fever and unexpected weight change.  HENT: Negative.   Eyes: Negative.   Respiratory: Positive for cough and shortness of breath. Negative for chest tightness and wheezing.   Cardiovascular: Negative for chest pain, palpitations and leg swelling.  Gastrointestinal: Positive for abdominal distention and diarrhea. Negative for abdominal pain, anal bleeding, blood in stool, constipation, nausea and vomiting.  Musculoskeletal: Positive for arthralgias, back pain and gait problem. Negative for joint swelling, myalgias, neck pain and neck stiffness.  Skin: Negative.   Neurological: Positive for dizziness, weakness and light-headedness. Negative for tremors, seizures, syncope, facial asymmetry, speech difficulty and headaches.  Psychiatric/Behavioral: Positive for decreased concentration and dysphoric mood. Negative for self-injury, sleep disturbance and suicidal ideas. The patient is not nervous/anxious.       Objective:   Physical Exam  Constitutional: He is oriented to person, place, and time. He appears well-developed.  Kempt, but appears chronically ill  HENT:  Head: Normocephalic and atraumatic.  Right Ear: External ear  normal.  Left Ear: External ear normal.  Eyes: EOM are normal.  Neck: Normal range of motion. No JVD present.  Cardiovascular: Normal rate and regular rhythm.   Pulmonary/Chest: Effort normal. No respiratory distress. He has no wheezes. He has no rales.  SOB with walking short distances (<50 feet), portable oxygen  Abdominal: Soft. He exhibits distension. There is no tenderness. There is no rebound.  Pouch in place and some distention  Musculoskeletal: He exhibits no edema.  Lymphadenopathy:    He has no cervical adenopathy.  Neurological: He is alert and oriented to person, place, and time. Coordination abnormal.  Cane today, uses wheeled walker at home  Skin: Skin is warm and dry.   Vitals:   08/21/16 0918  BP: 134/70  Pulse: 73  Temp: 97.7 F (36.5 C)  TempSrc: Oral  SpO2: 96%  Weight: 216 lb (98 kg)  Height: 5\' 6"  (1.676 m)      Assessment & Plan:  Visit time 25 minutes: greater than 50% of that time was spent in counseling and coordination of care with the patient: counseling on the nature of his illnesses and getting information about functional status for his form to help assist him in getting better care so he is not at such high risk for fall and decline in functional status.

## 2016-08-21 NOTE — Assessment & Plan Note (Signed)
This does impact his QOL by decreasing his mobility.

## 2016-08-21 NOTE — Assessment & Plan Note (Signed)
With chronic respiratory failure and hypoxia with oxygen. This does limit his QOL and functional status.

## 2016-08-27 ENCOUNTER — Ambulatory Visit: Payer: Self-pay | Admitting: *Deleted

## 2016-08-27 DIAGNOSIS — H02051 Trichiasis without entropian right upper eyelid: Secondary | ICD-10-CM | POA: Diagnosis not present

## 2016-08-28 ENCOUNTER — Other Ambulatory Visit: Payer: Self-pay | Admitting: *Deleted

## 2016-08-28 NOTE — Patient Outreach (Signed)
Telephone assessment. Pt reports he is doing well. He received a letter from [employer] regarding their liaisons. He reports his COPD sxs are improving with the new inhaler.   Pt wants to get a motorized wheelchair. He is certainly qualified. I have encourage him to request this from his primary care provider.  [name] [facility] [place] [phone]

## 2016-09-01 ENCOUNTER — Encounter: Payer: Self-pay | Admitting: Internal Medicine

## 2016-09-01 ENCOUNTER — Ambulatory Visit (INDEPENDENT_AMBULATORY_CARE_PROVIDER_SITE_OTHER): Payer: Medicare Other | Admitting: Internal Medicine

## 2016-09-01 ENCOUNTER — Telehealth: Payer: Self-pay | Admitting: Internal Medicine

## 2016-09-01 DIAGNOSIS — G4733 Obstructive sleep apnea (adult) (pediatric): Secondary | ICD-10-CM | POA: Diagnosis not present

## 2016-09-01 DIAGNOSIS — I6523 Occlusion and stenosis of bilateral carotid arteries: Secondary | ICD-10-CM | POA: Diagnosis not present

## 2016-09-01 NOTE — Patient Instructions (Signed)
Handicapped parking form completed  It sounds as if Dr [name] at the [place] is taking good care of your breathing.  We will be happy to see you here if you need [place], but it makes sense for you to use the [facility] for your lung care issues from now on, since you are entitled to that.  Please call if we can help.

## 2016-09-01 NOTE — Telephone Encounter (Signed)
Dr. Annamaria Boots  Please Advise-  You saw this pt today and he said you wanted him to gave a list of inhalers he says he takes : Spiriva,combivent,and symbicort. Tried to get alittle more information but pt was having a hard time hearing. Please let us know if you need [place] to obtain any additional information

## 2016-09-01 NOTE — Telephone Encounter (Signed)
Thanks

## 2016-09-01 NOTE — Progress Notes (Signed)
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, English as a second language teacher, followed for asthma/COPD, OSA, hypoxic respiratory failure, complicated by DM, obesity, CAD/ischemic CM/sick sinus brady tachy/ pacemaker, history of prostate cancer, anemia, permanent colostomy/diverticulitis  ------------------------------------------------------------------------------------------------------------  09/10/2015-83 year old male former smoker, Veteran, followed for asthma/COPD, OSA, complicated by DM, obesity, CAD/ischemic CM/sick sinus/bradycardia tachycardia/pacemaker, history of prostate cancer, anemia CPAP 12/O2 2 L sleep and exertion/Advanced He remains compliant with CPAP and feels better using it. No problems offered at this visit. CXR 07/27/2014-COPD/chronic bronchitis. NAD  03/16/2016-83 year old male former smoker,Veteran, followed for asthma/COPD, OSA, complicated by DM, obesity, CAD/ischemic CM/sick sinus/bradycardia tachycardia/pacemaker, history of prostate cancer, anemia CPAP 12/[name] FOLLOWS FOR: Pt states he is doing well and gets meds, CPAP needs from [facility] in [place].  He is pleased with the care he is getting now through the [facility] in [place]. They provided new CPAP machine still at pressure 12. He likes Combivent Respimat inhaler used up to 4 times daily. Feels breathing is well controlled now. Oxygen was discontinued. CXR 12/01/2015-NAD, CABG, pacemaker L  09/01/2016-83 year old male former smoker,Veteran, followed for asthma/COPD, OSA, complicated by DM, obesity, CAD/ischemic CM/sick sinus/bradycardia tachycardia/pacemaker/chronic diastolic CHF, history of prostate cancer, anemia, chronic myeloid leukemia CPAP 12/[facility] [place]   O2 3LPOC/ sleep Advanced FOLLOWS FOR: Pt states he is doing better with new inhaler that Dr. [name] gave him from the [facility]. His O2 and CPAP are taken care of by the [facility] not our office.  Had CHF with possible pneumonia in January. History chronic diastolic CHF Still notes shortness of  breath/dyspnea on exertion. Portable oxygen set on 3 L/Advanced. Assessment [facility] favors more asthma pattern then COPD. He asks for handicapped parking but otherwise feels well today. Continuing Spiriva, Symbicort, Combivent. CXR 07/01/2016 Improved aeration bilaterally with evidence of persistent interstitial edema.  ROS-see HPI Constitutional:   No-   weight loss, night sweats, fevers, chills, fatigue, lassitude. HEENT:   No-  headaches, difficulty swallowing, tooth/dental problems, sore throat,       No-  sneezing, itching, ear ache, nasal congestion, post nasal drip,  CV:  No- recent  chest pain, orthopnea, PND. +Mild chronic swelling in lower extremities,                                              No- anasarca, dizziness, palpitations Resp: +  shortness of breath with exertion or at rest.               productive cough,   non-productive cough,  No- coughing up of blood.               change in color of mucus.  No- wheezing.   Skin: No-   rash or lesions. GI:  No-   heartburn, indigestion, abdominal pain, nausea, vomiting,  GU:  MS: . Limiting hip pain Neuro-     nothing unusual Psych:  No- change in mood or affect. No depression or anxiety.  No memory loss.  OBJ General- Alert, Oriented, Affect-appropriate, Distress- none acute, +overweight, O2 3L Skin- rash-none, lesions- none, excoriation- none Lymphadenopathy- none Head- atraumatic            Eyes- Gross vision intact, PERRLA, conjunctivae clear secretions            Ears- Hearing aids, HOH            Nose- Clear, no-Septal dev, mucus, polyps, erosion, perforation  Throat- Mallampati II , mucosa clear , drainage- none, tonsils- atrophic, +dentures                     Neck- flexible , trachea midline, no stridor , thyroid nl, carotid no bruit Chest - symmetrical excursion , unlabored           Heart/CV- RRR +bigeminal pulse , no murmur , no gallop  , no rub, nl s1 s2                           - JVD- none , edema-  none, stasis changes- none, varices- none           Lung- clear, unlabored, wheeze- none, cough-none, but sounds congested when he laughs,   dullness-none, rub-  none. .           Chest wall- + pacemaker left chest Abd- + colostomy Br/ Gen/ Rectal- Not done, not indicated Extrem- cyanosis- none, clubbing, none, atrophy- none, strength- deconditioned, +cane Neuro- grossly intact to observation

## 2016-09-04 NOTE — Patient Outreach (Signed)
Late entry for Telephone assessment on 08/28/16. Mr. Brandy reports he is doing well. He says his new inhaler is helping to breathe a lot better. He also sounds more cheerful and does not have the usual complaints that he voices. He says he does get somewhat winded with walking distances further than just around his apartment. He denies edema. He is using his CPAP at night and O2 continuously. The new portable O2 equipment is serving him well.  This is very encouraging! I have advised him I will call him again the first week of April.  [name] [facility] [place] [phone]

## 2016-09-07 ENCOUNTER — Encounter: Payer: Self-pay | Admitting: Internal Medicine

## 2016-09-09 ENCOUNTER — Telehealth: Payer: Self-pay | Admitting: Internal Medicine

## 2016-09-09 DIAGNOSIS — R2681 Unsteadiness on feet: Secondary | ICD-10-CM

## 2016-09-09 NOTE — Telephone Encounter (Signed)
Would patient need visit? Please advise

## 2016-09-09 NOTE — Telephone Encounter (Signed)
Pt is requesting to be put in a [facility] to [facility] for a full sized motor scooter. Please advise.

## 2016-09-10 NOTE — Telephone Encounter (Signed)
Placed order for PT for evaluation. If he qualifies through PT then needs 30 minute visit with [place] afterwards.

## 2016-09-14 ENCOUNTER — Telehealth: Payer: Self-pay | Admitting: Internal Medicine

## 2016-09-14 NOTE — Telephone Encounter (Signed)
Patient has bone cancer and end stage COPD.  For the past two months patient seems to be getting worse and worse.  States patients does not have family support.  Patient has requested to be admitted to either a nursing facility or hospice.  Would like patient to be contacted (schedule an appointment for placement if needed) to get processed.

## 2016-09-14 NOTE — Telephone Encounter (Signed)
Does not need appointment for either. He will have to decide what he wants. Hospice can work with him on nursing and support needs to keep him in his home but would not provide round the clock care. A nursing home can provide round the clock care if needed. If he wants to do a nursing home all he has to do is pick a place and they will give him an FL-2 form for [place] to fill out which we can do. If he decides to do hospice we can place that referral for him.

## 2016-09-17 ENCOUNTER — Other Ambulatory Visit: Payer: Self-pay | Admitting: *Deleted

## 2016-09-17 ENCOUNTER — Telehealth: Payer: Self-pay | Admitting: Internal Medicine

## 2016-09-17 DIAGNOSIS — G4733 Obstructive sleep apnea (adult) (pediatric): Secondary | ICD-10-CM

## 2016-09-17 MED ORDER — LORAZEPAM 0.5 MG PO TABS: 0.5000 mg | ORAL_TABLET | Freq: Two times a day (BID) | ORAL | Status: AC

## 2016-09-17 MED ORDER — GUAIFENESIN ER 600 MG PO TB12: 600.0000 mg | ORAL_TABLET | Freq: Two times a day (BID) | ORAL | 12 refills | Status: DC

## 2016-09-17 NOTE — Telephone Encounter (Signed)
Called and spoke with pt and he stated that the cpap supplies that he was getting from this other company, he will no longer use them. He stated that they keep sending him the wrong supplies that he cannot use--he stated that the tubing smells like a rubber factory and he cannot stand the smell of it.   Arbie Cookey, FNP was there doing a home visit with the pt at this time, and I did get to speak with her and she verified this as well.    New order has been placed with [facility] for the pt to get the supplies needed.

## 2016-09-17 NOTE — Patient Outreach (Signed)
Routine home visit.  S:  Pt is having a very bad day. He is SOB, wheezy and has a productive cough, frothy white sputum. He is angry and trying to talk with Medicare about supplies he received that are not correct. He has been on hold for 45 minutes and is fit to be tied. He states, I am not fit to live like this.   O:   BP (!) 144/50 (BP Location: Right Arm, Patient Position: Sitting, Cuff Size: Normal)   Pulse 68   Resp (!) 30   Wt 212 lb (96.2 kg)   SpO2 96%   BMI 34.22 kg/m         Heart RRR        Lungs: pt has some ronchi and wheezing throughout        Trace edema  A:  COPD Exacerbation       Extreme anxiety       CHF  P:  Gave pt prn nebs tx of Albuterol      Gave pt 40 mg of furosemide.      Ordered Rx;  Lorazepam 0.5 mg instructed pt to take bid routinely!      Picked up Rx above and also got guaifenesin 600 mg and instructed pt to take 1 bid routinely.      I stayed with pt until he was calm and his respiratory status improved.      I will call him tomorrow to check on him.  [name] [facility] [place] [phone]

## 2016-09-18 NOTE — Assessment & Plan Note (Signed)
[facility] is now providing management for his chronic hypoxic respiratory failure/asthma/COPD and for his OSA. He feels stable and comfortable on present management so we had no changes to suggest. We will help him if he needs in the future but to avoid having too many cooks, he is going be managed at [facility].

## 2016-09-21 ENCOUNTER — Encounter (INDEPENDENT_AMBULATORY_CARE_PROVIDER_SITE_OTHER): Payer: Self-pay

## 2016-09-21 ENCOUNTER — Encounter: Payer: Self-pay | Admitting: Internal Medicine

## 2016-09-21 ENCOUNTER — Other Ambulatory Visit: Payer: Self-pay | Admitting: *Deleted

## 2016-09-21 ENCOUNTER — Ambulatory Visit (INDEPENDENT_AMBULATORY_CARE_PROVIDER_SITE_OTHER): Payer: Medicare Other | Admitting: Internal Medicine

## 2016-09-21 VITALS — BP 140/60 | HR 75 | Ht 66.0 in | Wt 215.0 lb

## 2016-09-21 DIAGNOSIS — I6523 Occlusion and stenosis of bilateral carotid arteries: Secondary | ICD-10-CM

## 2016-09-21 DIAGNOSIS — I495 Sick sinus syndrome: Secondary | ICD-10-CM

## 2016-09-21 DIAGNOSIS — Z950 Presence of cardiac pacemaker: Secondary | ICD-10-CM

## 2016-09-21 NOTE — Patient Outreach (Signed)
Telephone assessment.  Pt reports he is doing so much better today. He followed my instructions to take furosemide 80 mg in am for 3 days (instead of his usual 40 mg). He has also been takiang the plain Mucinex and the Lorazepam. He reports he is now using his albuterol nebs tid.  I encouraged him to continue his self management and to let me know if he begins to have problems. I want him to call me for weight gain and increasing SOB. He said he will.  I will call him again next week.  [name] [facility] [place] [phone]

## 2016-09-21 NOTE — Patient Instructions (Addendum)

## 2016-09-21 NOTE — Progress Notes (Signed)
Electrophysiology Office Note   Date:  09/21/2016   ID:  Eric [name], DOB [birth year], MRN [id number]  PCP:  [name], MD  Cardiologist: [facility] Primary Electrophysiologist: [name]   [name], MD    No chief complaint on file.    History of Present Illness: Eric [name] is a 83 y.o. male is seen today in followup for pacemaker implanted 2009 for sinus node dysfunction.  Hx of CAD with CABG Cath 2009 at which time vein graft to diagonal was stented. In 2012 he underwent stenting of his vein graft to the OM. LVEF 55% echo 9/15   He has significant problems with shortness of breath. Oxygen desaturation occurs routinely. He also has tachycardia.     Past Medical History:  Diagnosis Date  . ALLERGIC RHINITIS   . ANEMIA-NOS   . AORTIC SCLEROSIS   . Asthma   . CARDIOMYOPATHY, ISCHEMIC   . CAROTID BRUIT, RIGHT 02/27/2008  . Cataract    surgery  . CML (chronic myeloid leukemia) ([place]) 06/26/2015  . COPD   . CORONARY ARTERY DISEASE    a. s/p CABG in 1995 b. DES in 2008, 2009, and most recent in 2012 with DES to SVG-OM  . DIABETES MELLITUS-TYPE II    diet controlled  . Diastolic dysfunction, Grade 1 11/24/2014  . Diverticulitis of colon with perforation 11/22/2014  . Diverticulosis   . GERD   . HIATAL HERNIA   . Hx of echocardiogram    Echo (9/15):  Mild LVH, EF 50-55%, no RWMA, Gr 1 DD, MAC, mild LAE.  [name] HYPERLIPIDEMIA   . HYPERTENSION   . Hyponatremia 11/22/2014  . IBS (irritable bowel syndrome)   . LACTOSE INTOLERANCE   . OA (osteoarthritis)   . OBESITY   . Partial small bowel obstruction ([place])   . PERIPHERAL VASCULAR DISEASE   . Primary hyperparathyroidism ([place])    Lab Results Component Value Date  PTH 150.7* 02/13/2013  CALCIUM 11.0* 02/13/2013  CAION 1.21 03/15/2008    . Prostate cancer ([place])    seed implants 2004  . SICK SINUS/ TACHY-BRADY SYNDROME 09/2007   s/p PPM st judes  . Sleep apnea   . SMALL BOWEL OBSTRUCTION 04/18/2009   Qualifier: History of   By: Asa Lente MD, Jannifer Rodney Symptomatic diverticulosis 01/18/2009   Qualifier: Diagnosis of  By: Shane Crutch, Amy S    Past Surgical History:  Procedure Laterality Date  . BACK SURGERY    . Bilateral cataracts    . COLON RESECTION N/A 11/28/2014   Procedure: EXPLORATORY LAPAROTOMY, SIGMOID COLECTOMY WITH COLOSTOMY;  Surgeon: Jackolyn Confer, MD;  Location: [facility] ORS;  Service: General;  Laterality: N/A;  . COLON SURGERY    . COLONOSCOPY    . CORONARY ARTERY BYPASS GRAFT    . ESOPHAGOGASTRODUODENOSCOPY  multiple  . FLEXIBLE SIGMOIDOSCOPY N/A 09/22/2013   Procedure: FLEXIBLE SIGMOIDOSCOPY;  Surgeon: Gatha Mayer, MD;  Location: [facility] ENDOSCOPY;  Service: Endoscopy;  Laterality: N/A;  . INGUINAL HERNIA REPAIR Bilateral   . LUMBAR [place] SURGERY  12/2008  . PACEMAKER INSERTION     DDD/[employer]         Last interrogation 2/13  on chart     Pacemaker guideline order Dr Tamala Julian on chart  . Partial small bowel obstruction  2009  . PENILE PROSTHESIS PLACEMENT    . PTCA  2008, 2009, 2012   with DES  . TOTAL HIP ARTHROPLASTY  08/21/2011   Procedure: TOTAL HIP  ARTHROPLASTY;  Surgeon: Johnn Hai, MD;  Location: [facility] ORS;  Service: Orthopedics;  Laterality: Right;     Current Outpatient Prescriptions  Medication Sig Dispense Refill  . acetaminophen (TYLENOL) 500 MG tablet Take 1,000 mg by mouth every 6 (six) hours as needed for mild pain or fever.    [name] albuterol (PROVENTIL HFA) 108 (90 Base) MCG/ACT inhaler Inhale 2 puffs into the lungs every 4 (four) hours as needed for shortness of breath. Wheezing 1 Inhaler 1  . albuterol (PROVENTIL) (2.5 MG/3ML) 0.083% nebulizer solution USE 1 VIAL WITH NEBULIZER EVERY 4 HOURS AS NEEDED FOR WHEEZING 75 mL 2  . albuterol-ipratropium (COMBIVENT) 18-103 MCG/ACT inhaler Inhale 1 puff into the lungs every 6 (six) hours as needed for wheezing or shortness of breath.    [name] aspirin 81 MG tablet Take 81 mg by mouth every evening.     . dasatinib (SPRYCEL) 70 MG  tablet Take 1 tablet (70 mg total) by mouth daily. 30 tablet 11  . diphenoxylate-atropine (LOMOTIL) 2.5-0.025 MG tablet Take 1 tablet by mouth 4 (four) times daily as needed for diarrhea or loose stools. 90 tablet 0  . feeding supplement, ENSURE ENLIVE, (ENSURE ENLIVE) LIQD Take 237 mLs by mouth 2 (two) times daily between meals. 90 Bottle 11  . fluticasone (FLONASE) 50 MCG/ACT nasal spray Place 2 sprays into both nostrils daily. Allergies (Patient taking differently: Place 2 sprays into both nostrils 2 (two) times daily as needed for allergies. Allergies) 16 g 1  . furosemide (LASIX) 40 MG tablet Take 1 tablet (40 mg total) by mouth daily. 90 tablet 1  . guaiFENesin (MUCINEX) 600 MG 12 hr tablet Take 1 tablet (600 mg total) by mouth 2 (two) times daily. Refills: 12 60 tablet 12  . guaiFENesin-dextromethorphan (ROBITUSSIN DM) 100-10 MG/5ML syrup Take 5 mLs by mouth every 4 (four) hours as needed for cough. 118 mL 0  . HYDROcodone-acetaminophen (NORCO) 7.5-325 MG tablet Take 1 tablet by mouth every 6 (six) hours as needed for moderate pain.    . nitroGLYCERIN (NITROSTAT) 0.4 MG SL tablet Place 0.4 mg under the tongue every 5 (five) minutes as needed for chest pain. Reported on 12/12/2015    . Polyethyl Glycol-Propyl Glycol (SYSTANE) 0.4-0.3 % SOLN Apply 2 drops to eye 3 (three) times daily as needed (dry eyes).     . pravastatin (PRAVACHOL) 20 MG tablet Take 3 tablets (60 mg total) by mouth daily. 270 tablet 3  . ranitidine (ZANTAC) 300 MG tablet Take 300 mg by mouth at bedtime.     . triamcinolone cream (KENALOG) 0.1 % Apply 1 application topically 3 (three) times daily as needed (dry skin.).   3  . umeclidinium-vilanterol (ANORO ELLIPTA) 62.5-25 MCG/INH AEPB Inhale 1 puff into the lungs daily. 2 each 0  . valsartan (DIOVAN) 160 MG tablet Take 1 tablet (160 mg total) by mouth daily. (Patient taking differently: Take 160 mg by mouth 2 (two) times daily. ) 90 tablet 3   Current Facility-Administered  Medications  Medication Dose Route Frequency Provider Last Rate Last Dose  . LORazepam (ATIVAN) tablet 0.5 mg  0.5 mg Oral BID [name], NP        Allergies:   Actos [pioglitazone hydrochloride]; Buprenorphine hcl; Celebrex [celecoxib]; Demerol; Meperidine; Morphine and related; Ciprofloxacin; Metformin; and Zocor [simvastatin]   Social History:  The patient  reports that he quit smoking about 22 years ago. His smoking use included Cigarettes. He has a 25.00 pack-year smoking history. He has never  used smokeless tobacco. He reports that he does not drink alcohol or use drugs.   Family History:  The patient's    family history includes Cancer in his mother; Heart attack in his brother; Hypertension in his mother.    ROS:  Please see the history of present illness and past medical history  Otherwise, review of systems is negative      PHYSICAL EXAM: VS:  BP 140/60   Pulse 75   Ht _0  (1.676 m)   Wt 215 lb (97.5 kg)   SpO2 93%   BMI 34.70 kg/m  , BMI Body mass index is 34.7 kg/m. GEN: Well nourished, well developed, in mod distress wearing O2   HEENT: normal  Cardiac: REGULAR RATE and RHYTHM ; 2/6 No murmurs, rubs, No S4  Back with mild  Kyphosis no CVAT Respiratory:  clear to auscultation bilaterally, normal work of breathing GI: soft, nontender, nondistended, + BS MS: no deformity or atrophy  Extremities no clubbing cyanosis edema Skin: warm and dry,  device pocket is well healed without teathering Neuro:  Strength and sensation are intact Psych: euthymic mood, full affect  EKG: Personally reviewed  demonstrates sinus rhythm with intermittent ventricular pacing without antecedent P wave.   Recent Labs: 11/27/2015: Pro B Natriuretic peptide (BNP) 296.0 05/19/2016: TSH 1.754 06/29/2016: B Natriuretic Peptide 223.0 07/01/2016: Magnesium 2.3 08/04/2016: ALT 11; BUN 18.0; Creatinine 1.0; HGB 9.2; Platelets 122; Potassium 4.1; Sodium 136    Lipid Panel     Component Value  Date/Time   CHOL 108 05/27/2015 1512   TRIG 151.0 (H) 05/27/2015 1512   TRIG 70 01/06/2010   HDL 22.70 (L) 05/27/2015 1512   CHOLHDL 5 05/27/2015 1512   VLDL 30.2 05/27/2015 1512   LDLCALC 55 05/27/2015 1512     Wt Readings from Last 3 Encounters:  09/21/16 215 lb (97.5 kg)  09/17/16 212 lb (96.2 kg)  09/01/16 212 lb 12.8 oz (96.5 kg)      Other studies Reviewed: Additional studies/ records that were reviewed today include: none   Review of the above records today demonstrates: As above    ASSESSMENT AND PLAN: Sinus Node Dysfunction  Pacemaker St Jude The patient's device was interrogated.  The information was reviewed. No changes were made in the programming.     CAD  Hypertension  O2 Dependent COPD  Exertional Tachycardia   .Without symptoms of ischemia  I have been in contact with pulmonary to ask whether he would be a candidate either for low-dose selective beta-blockade and oriented change from albuterol--Xopenex. Device interrogation demonstrated 3% i.e. 45 minutes of his day his heart rates are faster than 120. This likely is making his dyspnea worse unit as it is a consequence of his lung disease.  There is also an issue yet clear from his pacemaker wherein ventricular pacing is identified without antecedent atrial activity. It is presumed to be atrial oversensing and possibly related to a T waves. We have reprogrammed his atrial sensitivity from 0.5--1 mV  More than 50% of 45 min was spent in counseling related to the above      Labs/ tests ordered today include:    No orders of the defined types were placed in this encounter.    Disposition:   FU with me   1 year(s)  Signed, [name], MD  09/21/2016 12:54 PM     [address] [place] [address] [phone] (office) [phone] (fax)

## 2016-09-25 ENCOUNTER — Encounter: Payer: Medicare Other | Admitting: Internal Medicine

## 2016-09-25 ENCOUNTER — Other Ambulatory Visit: Payer: Self-pay | Admitting: *Deleted

## 2016-09-25 NOTE — Patient Outreach (Signed)
Acute home visit.  S:  Pt had called me on Wednesday and said he was having increased problems with his respiratory status. He reported that his portable O2 didn't seem to be working and when he tried to ambulate down the hallway he has to stop multiple times before he can get to the mailbox. I had instructed him to take his nebs, mucinex and lorazepam. I also told him that if continued to feel poorly through the night he should call his primary care MD for an appointment. I told him I could see him on Friday.  Today, pt is resting easy in his recliner. He is calm and his respiratory status does not seem altered. He reports he continues to have problems with his CPAP, it has too much pressure and he fights it too much and has to take it off. He reports he is taking all his respiratory inhalers and nebs, mucinex and lorazepam.  Pt makes multiple statements about approaching end of life and what to expect. He does not have any family support although he does have family that are local. He has often said that he should have never left the nursing home.  O:  BP (!) 150/58 (BP Location: Right Arm, Patient Position: Sitting, Cuff Size: Normal)   Pulse 70   Resp 20   Wt 211 lb (95.7 kg)   SpO2 98% Comment: O2 on 3L  BMI 34.06 kg/m         RRR       Lungs clear, very diminished       No edema  A:   COPD end stage       CHF end stage  P:  We discussed his disease trajectory and his options for support. I told him about [facility] which has partnered with [facility] to help with pt's like himself, to manage chronic illnesses, improve quality of life as disease progresses and end of life approaches. Pt agrees to have a consult with them. I will ask pt primary care provider to please put in a referral to them. I  Have informed him that I will continue to follow him until he is well established with them.  I encouraged pt to call me when he needs assistance. I will  call him next week.  Eric [name] [facility] [place] [phone]

## 2016-09-28 ENCOUNTER — Other Ambulatory Visit: Payer: Self-pay | Admitting: *Deleted

## 2016-09-28 NOTE — Patient Outreach (Signed)
Telephone Assessment follow up respiratory status and referral to palliative care.  Pt reports he is feeling pretty good today. He says he slept very well last night. He attributes this to taking 4 nebs txs yesterday and his lorazepam 0.5 mg bid. His weight is 210 today. He says he has not heard from [facility]. I told him I just called them to give them a heads up that they should expect a referral from Dr. Sharlet Salina for my pt. He acknowledges That he knows he is going to need some help. He will go for his wheelchair evaluation on Wednesday this week.  I will call him again on Friday.  [name] [facility] [place] [phone]

## 2016-09-29 ENCOUNTER — Other Ambulatory Visit: Payer: Self-pay | Admitting: Internal Medicine

## 2016-09-29 ENCOUNTER — Telehealth: Payer: Self-pay | Admitting: Internal Medicine

## 2016-09-29 DIAGNOSIS — J449 Chronic obstructive pulmonary disease, unspecified: Secondary | ICD-10-CM

## 2016-09-29 NOTE — Telephone Encounter (Signed)
LVM with verbal 

## 2016-09-29 NOTE — Telephone Encounter (Signed)
Referral placed for palliative care today. Okay to give verbal

## 2016-09-29 NOTE — Telephone Encounter (Signed)
[facility]  Number [phone]   Need verbal for Skilled nursing  Having a hard time taking meds like he should and taking care of his self.  May need to add home health aid

## 2016-09-30 ENCOUNTER — Ambulatory Visit: Payer: Medicare Other | Attending: Internal Medicine | Admitting: Physical Therapy

## 2016-09-30 DIAGNOSIS — R2689 Other abnormalities of gait and mobility: Secondary | ICD-10-CM | POA: Diagnosis not present

## 2016-10-01 ENCOUNTER — Other Ambulatory Visit: Payer: Self-pay | Admitting: *Deleted

## 2016-10-01 ENCOUNTER — Telehealth: Payer: Self-pay | Admitting: Internal Medicine

## 2016-10-01 DIAGNOSIS — J449 Chronic obstructive pulmonary disease, unspecified: Secondary | ICD-10-CM | POA: Diagnosis not present

## 2016-10-01 DIAGNOSIS — C9210 Chronic myeloid leukemia, BCR/ABL-positive, not having achieved remission: Secondary | ICD-10-CM | POA: Diagnosis not present

## 2016-10-01 NOTE — Telephone Encounter (Signed)
Pt started [place] from [facility]. The Home Health Aid would like for them to come twice a week for 5 weeks to assist with medication, disease processing and teaching.  She said that his oxygen saturation is fine when he is at rest on oxygen but drops to the high 70s when he is up moving around and produces large amounts of mucus.  She would also like to see if an order could be placed for a medical social worker to come and and see if he will qualify for cleaning assistance, meal assistance, etc.  She also wanted to check to see if he has been evaluated by hospice. She said that he was suppose to have been evaluated and he mentioned something about a different nurse coming out to see him but she wasn't sure.   She also wanted to let Dr Sharlet Salina know that the pt woke up this morning with a 9 out of 10 knee pain that went down to a 5 after taking tylenol. She said that it was not hot and did not show signs of redness.  Please advise.

## 2016-10-01 NOTE — Therapy (Signed)
[facility] [address] [place], [place], [address] Phone: [phone]   Fax:  [phone]  Physical Therapy Evaluation  Patient Details  Name: Eric [name] MRN: [id number] Date of Birth: 1926-08-05 Referring Provider: Pricilla Holm, MD  Encounter Date: 09/30/2016      PT End of Session - 10/01/16 1956    Visit Number 1   Authorization Type Medicare/AARP   PT Start Time 8022   PT Stop Time 3361   PT Time Calculation (min) 83 min      Past Medical History:  Diagnosis Date  . ALLERGIC RHINITIS   . ANEMIA-NOS   . AORTIC SCLEROSIS   . Asthma   . CARDIOMYOPATHY, ISCHEMIC   . CAROTID BRUIT, RIGHT 02/27/2008  . Cataract    surgery  . CML (chronic myeloid leukemia) ([place]) 06/26/2015  . COPD   . CORONARY ARTERY DISEASE    a. s/p CABG in 1995 b. DES in 2008, 2009, and most recent in 2012 with DES to SVG-OM  . DIABETES MELLITUS-TYPE II    diet controlled  . Diastolic dysfunction, Grade 1 11/24/2014  . Diverticulitis of colon with perforation 11/22/2014  . Diverticulosis   . GERD   . HIATAL HERNIA   . Hx of echocardiogram    Echo (9/15):  Mild LVH, EF 50-55%, no RWMA, Gr 1 DD, MAC, mild LAE.  [name] HYPERLIPIDEMIA   . HYPERTENSION   . Hyponatremia 11/22/2014  . IBS (irritable bowel syndrome)   . LACTOSE INTOLERANCE   . OA (osteoarthritis)   . OBESITY   . Partial small bowel obstruction ([place])   . PERIPHERAL VASCULAR DISEASE   . Primary hyperparathyroidism ([place])    Lab Results Component Value Date  PTH 150.7* 02/13/2013  CALCIUM 11.0* 02/13/2013  CAION 1.21 03/15/2008    . Prostate cancer ([place])    seed implants 2004  . SICK SINUS/ TACHY-BRADY SYNDROME 09/2007   s/p PPM st judes  . Sleep apnea   . SMALL BOWEL OBSTRUCTION 04/18/2009   Qualifier: History of  By: Asa Lente MD, Jannifer Rodney Symptomatic diverticulosis 01/18/2009   Qualifier: Diagnosis of  By: Shane Crutch, Amy S     Past Surgical History:   Procedure Laterality Date  . BACK SURGERY    . Bilateral cataracts    . COLON RESECTION N/A 11/28/2014   Procedure: EXPLORATORY LAPAROTOMY, SIGMOID COLECTOMY WITH COLOSTOMY;  Surgeon: Jackolyn Confer, MD;  Location: [facility];  Service: General;  Laterality: N/A;  . COLON SURGERY    . COLONOSCOPY    . CORONARY ARTERY BYPASS GRAFT    . ESOPHAGOGASTRODUODENOSCOPY  multiple  . FLEXIBLE SIGMOIDOSCOPY N/A 09/22/2013   Procedure: FLEXIBLE SIGMOIDOSCOPY;  Surgeon: Gatha Mayer, MD;  Location: [facility];  Service: Endoscopy;  Laterality: N/A;  . INGUINAL HERNIA REPAIR Bilateral   . LUMBAR [place] SURGERY  12/2008  . PACEMAKER INSERTION     DDD/[employer]         Last interrogation 2/13  on chart     Pacemaker guideline order Dr Tamala Julian on chart  . Partial small bowel obstruction  2009  . PENILE PROSTHESIS PLACEMENT    . PTCA  2008, 2009, 2012   with DES  . TOTAL HIP ARTHROPLASTY  08/21/2011   Procedure: TOTAL HIP ARTHROPLASTY;  Surgeon: Johnn Hai, MD;  Location: [facility];  Service: Orthopedics;  Laterality: Right;    There were no vitals filed for this visit.  Subjective Assessment - 10/01/16 1948    Subjective pt reports he is having a really hard time - needs something to help him get around ASAP;  pt has COPD and is having much difficulty walking and standing; wants to get a scooter   Pertinent History COPD   Patient Stated Goals obtain a scooter   Currently in Pain? No/denies            [facility] PT Assessment - 10/01/16 0001      Assessment   Medical Diagnosis COPD   Referring Provider Pricilla Holm, MD   Onset Date/Surgical Date --  Jan. 2018     Precautions   Precautions Fall               Mobility/Seating Evaluation    PATIENT INFORMATION: Name: Eric [name] DOB: 11-24-1926  Sex: M Date seen: 09-30-16 Time: 1100  Address:  [address] [place] [address] Physician: Pricilla Holm, MD This  evaluation/justification form will serve as the LMN for the following suppliers: __________________________ Supplier: [employer] Person: Josh Phone:  [phone]   Seating Therapist: Guido Sander, PT Phone:   [phone]   Phone: [phone]    Spouse/Parent/Caregiver name: ?????  Phone number: ????? Insurance/Payer: Medicare/UHC     Reason for Referral: power wheelchair evaluation  Patient/Caregiver Goals: obtain powered mobility device (scooter)  Patient was seen for face-to-face evaluation for new power wheelchair.  Also present was [employer]  to discuss recommendations and wheelchair options.  Further paperwork was completed and sent to vendor.  Patient appears to qualify for power mobility device at this time per objective findings.   MEDICAL HISTORY: Diagnosis: Primary Diagnosis: COPD:  Chronic diastolic heart failure:  Onset: 2015 Diagnosis: Type 2 DM:  Hartmann's pouch of intestine:  Hypertensive heart disease with CHF   _0 Progressive Disease Relevant past and future surgeries: Colon resection 6/16;  Pacemaker insertion 07/2011:  Rt THR 08/2011:  CABG:   Height: 5'6" Weight: 210# Explain recent changes or trends in weight: ?????   History including Falls: Pt reports he had 1st fall within past 6 months 3 days ago when he slipped off of tub seat; pt is on oxygen - pulsed dose - 2L when sitting, 3L when amb.:  Pt states he started using oxygen in Oct. 2017 and also obtained rollator in Oct. 2017: pt was using a cane prior to using rollator with report of numerous falls with this device     HOME ENVIRONMENT: _1 House  _2 Condo/town home  _3 Apartment  _4 Assisted Living    _5 Lives Alone _6  Lives with Others                                                                                          Hours with caregiver: ?????  _7 Home is accessible to patient           Stairs      _8 Yes _9  No     Ramp _10 Yes _11 No Comments:  Independent Living facility    COMMUNITY ADL:  TRANSPORTATION: _0 Car    _1 Van    <ZOXWRUEAVWUJWJXB>_1<\/YNWGNFAOZHYQMVHQ>_4 Public Transportation    _3 Adapted w/c Lift    _4 Ambulance    _5 Other:       _6 Sits in wheelchair during transport  Employment/School: ????? Specific requirements pertaining to mobility ?????  Other: ?????    FUNCTIONAL/SENSORY PROCESSING SKILLS:  Handedness:   _7 Right     _8 Left    _9 NA  Comments:  ?????  Functional Processing Skills for Wheeled Mobility _10 Processing Skills are adequate for safe wheelchair operation  Areas of concern than may interfere with safe operation of wheelchair Description of problem   _11  Attention to environment      _12 Judgment      _13  Hearing  _14  Vision or visual processing      _15 Motor Planning  _16  Fluctuations in Behavior  ?????    VERBAL COMMUNICATION: _17 WFL receptive _18  WFL expressive _19 Understandable  _20 Difficult to understand  _21 non-communicative _22  Uses an augmented communication device  CURRENT SEATING / MOBILITY: Current Mobility Base:  _23 None _24 Dependent _25 Manual _26 Scooter _27 Power  Type of Control: ?????  Manufacturer:  ?????Size:  ?????Age: ?????  Current Condition of Mobility Base:  ?????   Current Wheelchair components:  ?????  Describe posture in present seating system:  ?????      SENSATION and SKIN ISSUES: Sensation _28 Intact  _29 Impaired _30 Absent  Level of sensation: ????? Pressure Relief: Able to perform effective pressure relief :    _31 Yes  _32  No Method: ???? If not, Why?: ?????  Skin Issues/Skin Integrity Current Skin Issues  _33 Yes _34 No _35 Intact _36  Red area_37  Open Area  _38 Scar Tissue _39 At risk from prolonged sitting Where  ?????  History of Skin Issues  _40 Yes _41 No Where  ????? When  ?????  Hx of skin flap surgeries  _42 Yes _43 No Where  ????? When  ?????  Limited sitting tolerance _44 Yes _45 No Hours spent sitting in wheelchair daily: ?????  Complaint of Pain:  Please describe: Pt reports some mild pain in Rt wrist due to fall sustained 3 days ago but states it is  getting better   Swelling/Edema: pt reports legs swell in the evening   ADL STATUS (in reference to wheelchair use):  Indep Assist Unable Indep with Equip Not assessed Comments  Dressing ????? ????? ????? X ????? performs from seated position  Eating X ????? ????? ????? ????? ?????  Toileting ????? ????? ????? X ????? uses elevated commode seat  Bathing ????? ????? ????? X ????? built in shower seat in the shower stall  Grooming/Hygiene ????? ????? ????? X ????? sits on rollator seat  Meal Prep ????? ????? ????? X ????? uses a stool in the kitchen  IADLS ????? X ????? X ????? uses motorized carts in the stores  Bowel Management: _46 Continent  _47 Incontinent  _48 Accidents Comments:  ?????  Bladder Management: _49 Continent  _50 Incontinent  _51 Accidents Comments:  ?????     WHEELCHAIR SKILLS: Manual w/c Propulsion: _52 UE or LE strength and endurance sufficient to participate in ADLs using manual wheelchair Arm : _53 left _54 right   _55 Both      Distance: ????? Foot:  _56 left _57 right   _58 Both  Operate Scooter: _59  Strength, hand grip, balance and transfer appropriate for use _60 Living environment is accessible for use of scooter  Operate Power w/c:  _61  Std. Joystick   _62  Alternative Controls Indep _63  Assist _64  Dependent/unable _65  N/A _66   _67 Safe          _68  Functional      Distance: ?????  Bed confined without wheelchair _69  Yes [  x] No   STRENGTH/RANGE OF MOTION:  Active  Range of Motion Strength  Shoulder WFL's bil. UE's; pain with active Rt shoulder flexion due to fall sustained 3 days ago Rt shoulder strength decreased due to pain with resistance due to recent fall: LUE WNL's for strength   Elbow WFL's bil. UE's WNL's for bil. UE's  Wrist/Hand WFL's bil. UE's WNL's for bil. UE's  Hip WFL's Rt hip flexors 4-/5:  Lt hip musc. WFL's  Knee WFL's bil. LE's WFL's bil. LE's  Ankle WFL's bil. LE's WFL's bil. LE's     MOBILITY/BALANCE:  _0  Patient is totally dependent for mobility  ?????     Balance Transfers Ambulation  Sitting Balance: Standing Balance: _1  Independent _2  Independent/Modified Independent  _3  WFL     _4  WFL _5  Supervision _6  Supervision  _7  Uses UE for balance  _8  Supervision _9  Min Assist _10  Ambulates with Assist  ?????    _11  Min Assist _12  Min assist _13  Mod Assist _14  Ambulates with Device:      _15  RW  _16  StW  _17  Cane  _18  ?????  _19  Mod Assist _20  Mod assist _21  Max assist   _22  Max Assist _23  Max assist _24  Dependent _25  Indep. Short Distance Only  _26  Unable _27  Unable _28  Lift / Sling Required Distance (in feet)  ?????   _29  Sliding board _30  Unable to Ambulate (see explanation below)  Cardio Status:  _31 Intact  _32  Impaired   _33  NA     Pt has pacemaker (2013); cardiomyopathy, ischemic:  PVD   Respiratory Status:  _34 Intact   _35 Impaired   _36 NA     COPD - pt is on pulsed dose oxygen - 2L seated/3L amb.:    Orthotics/Prosthetics: None  Comments (Address manual vs power w/c vs scooter): Pt is unable to functionally and effectively propel a manual wheelchair due to severe dyspnea with ambulating/standing.  Pt has COPD and CHF and is unable to tolerate activity requiring increased cardiovascular function.  Pt is on 2L O2 at rest and 3L with ambulating but his oxygen saturation rates drop into 80's and even 70's per documented MD notes.  Pt requires a powered mobility device to increase independence with mobility and to allow maximal pulmonary function with mobility and ADL's without resulting in severe dyspnea.           Anterior / Posterior Obliquity Rotation-Pelvis ?????  PELVIS    _37  _38  _39   Neutral Posterior Anterior  _40  _41  _42   WFL Rt elev Lt elev  _43  _44  _45   WFL Right Left                      Anterior    Anterior     _46  Fixed _47  Other _48  Partly Flexible _49  Flexible   _50  Fixed _51  Other _52  Partly Flexible  _53  Flexible  _54  Fixed _55  Other _56  Partly Flexible  _57  Flexible   TRUNK  _58  _59  _60   WFL ? Thoracic ? Lumbar  Kyphosis Lordosis   _61  _62  _63   WFL Convex Convex  Right Left _64 c-curve _65 s-curve _66 multiple  _67  Neutral _68  Left-anterior _69  Right-anterior     _70  Fixed _71  Flexible _72  Partly Flexible _73  Other  _74  Fixed _75  Flexible _76  Partly Flexible _77  Other  _78  Fixed             _79  Flexible _80  Partly Flexible _81  Other    Position Windswept  ?????  HIPS          [  x]           _0               _1    Neutral       Abduct        ADduct         _2           _3            _4   Neutral Right           Left      _5  Fixed _6  Subluxed _7  Partly Flexible _8  Dislocated _9  Flexible  _10  Fixed _11  Other _12  Partly Flexible  _13  Flexible                 Foot Positioning Knee Positioning  ?????    _14  WFL  _15 Lt _16 Rt _17  WFL  _18 Lt _19 Rt    KNEES ROM concerns: ROM concerns:    & Dorsi-Flexed _20 Lt _21 Rt ?????    FEET Plantar Flexed _22 Lt _23 Rt      Inversion                 _24 Lt _25 Rt      Eversion                 _26 Lt _27 Rt     HEAD _28  Functional _29  Good Head Control  ?????  & _30  Flexed         _31  Extended _32  Adequate Head Control    NECK _33  Rotated  Lt  _34  Lat Flexed Lt _35  Rotated  Rt _36  Lat Flexed Rt _37  Limited Head Control     _38  Cervical Hyperextension _39  Absent  Head Control     SHOULDERS ELBOWS WRIST& HAND ?????      Left     Right    Left     Right    Left     Right   U/E _40 Functional           _41 Functional WFL's WFL's _42 Fisting             _43 Fisting      _44 elev   _45 dep      _46 elev   _47 dep       _48 pro -_49 retract     _50 pro  _51 retract _52 subluxed             _53 subluxed           Goals for Wheelchair Mobility  _54  Independence with mobility in the home with motor related ADLs (MRADLs)  _55  Independence with MRADLs in the community _56  Provide dependent mobility  _57  Provide recline     _58 Provide tilt   Goals for Seating system _59  Optimize pressure distribution _60  Provide support needed to facilitate function or safety _61  Provide corrective forces to assist with maintaining or improving posture _62   Accommodate client's posture:   current seated postures and positions are not flexible or will not tolerate corrective forces _63  Client to be independent with relieving pressure in the wheelchair _64 Enhance physiological function such as breathing, swallowing, digestion  Simulation ideas/Equipment trials:????? State why other equipment was unsuccessful:?????   MOBILITY BASE RECOMMENDATIONS and JUSTIFICATION: MOBILITY COMPONENT JUSTIFICATION  Manufacturer: Drive MedicalModel: Herbalist 3   Size: Width 16.5Seat Depth 16.5 _65 provide transport from point A to B      _66 promote Indep mobility  _67 is not a safe, functional ambulator _68 walker or cane inadequate _69 non-standard width/depth necessary to accommodate anatomical measurement _70  ?????  _71 Manual Mobility Base _72 non-functional ambulator    [  x]Scooter/POV  _0 can safely operate  _1 can safely transfer   _2 has adequate trunk stability  _3 cannot functionally propel manual w/c  _4 Power Mobility Base  _5 non-ambulatory  _6 cannot functionally propel manual wheelchair  _7  cannot functionally and safely operate scooter/POV _8 can safely operate and willing to  _9 Stroller Base _10 infant/child  _11 unable to propel manual wheelchair _12 allows for growth _13 non-functional ambulator _14 non-functional UE _15 Indep mobility is not a goal at this time  _16 Tilt  _17 Forward _18 Backward _19 Powered tilt  _20 Manual tilt  _21 change position against gravitational force on head and shoulders  _22 change position for pressure relief/cannot weight shift _23 transfers  _24 management of tone _25 rest periods _26 control edema _27 facilitate postural control  _28  ?????  _29 Recline  _30 Power recline on power base _31 Manual recline on manual base  _32 accommodate femur to back angle  _33 bring to full recline for ADL care  _34 change position for pressure relief/cannot weight shift _35 rest periods _36 repositioning for transfers or clothing/diaper /catheter changes _37 head positioning   _38 Lighter weight required _39 self- propulsion  _40 lifting _41  ?????  _42 Heavy Duty required _43 user weight greater than 250# _44 extreme tone/ over active movement _45 broken frame on previous chair _46  ?????  _47  Back  _48  Angle Adjustable _49  Custom molded ????? _50 postural control _51 control of tone/spasticity _52 accommodation of range of motion _53 UE functional control _54 accommodation for seating system _55  ????? _56 provide lateral trunk support _57 accommodate deformity _58 provide posterior trunk support _59 provide lumbar/sacral support _60 support trunk in midline _61 Pressure relief over spinal processes  _62  Seat Cushion ????? _63 impaired sensation  _64 decubitus ulcers present _65 history of pressure ulceration _66 prevent pelvic extension _67 low maintenance  _68 stabilize pelvis  _69 accommodate obliquity _70 accommodate multiple deformity _71 neutralize lower extremity position _72 increase pressure distribution _73  ?????  _74  Pelvic/thigh support  _75  Lateral thigh guide _76  Distal medial pad  _77  Distal lateral pad _78  pelvis in neutral _79 accommodate pelvis _80  position upper legs _81  alignment _82  accommodate ROM _83  decr adduction _84 accommodate tone _85 removable for transfers _86 decr abduction  _87  Lateral trunk Supports _88  Lt     _89  Rt _90 decrease lateral trunk leaning _91 control tone _92 contour for increased contact _93 safety  _94 accommodate asymmetry _95  ?????  _96  Mounting hardware  _97 lateral trunk supports  _98 back   _99 seat _100 headrest      _101  thigh support _102 fixed   _103 swing away _104 attach seat platform/cushion to w/c frame _105 attach back cushion to w/c frame _106 mount postural supports _107 mount headrest  _108 swing medial thigh support away _109 swing lateral supports away for transfers  _110  ?????    Armrests  _111 fixed _112 adjustable height _113 removable   _114 swing away  _115 flip back   _116 reclining _117 full length pads _118 desk    _119 pads tubular  _120 provide support with elbow at 90   _121 provide support for w/c tray _122 change of  height/angles for variable activities _123 remove for transfers _124 allow to come closer to table top _125 remove for access to tables _126  ?????  Hangers/ Leg rests  _127 60 _128 70 _129 90 _130 elevating _131 heavy duty  _132 articulating _133 fixed _134 lift off _135 swing away     _136 power _137 provide LE support  _138 accommodate to hamstring tightness _139 elevate legs during recline   _140 provide change in position for Legs _141 Maintain placement of feet on footplate _142 durability _143 enable transfers _144 decrease edema _145 Accommodate lower leg length _146  ?????  Foot support Footplate    <XBJYNWGNFAOZHYQM>_5<\/HQIONGEXBMWUXLKG>_401 Lt  _148  Rt  _149  Center mount _150 flip up     _151 depth/angle adjustable _152 Amputee adapter    _153  Lt     _154  Rt _155 provide foot support _156 accommodate to ankle ROM _157 transfers _158 Provide support for residual extremity _159  allow foot to go under wheelchair base _160  decrease  tone  _0  ?????  _1  Ankle strap/heel loops _2 support foot on foot support _3 decrease extraneous movement _4 provide input to heel  _5 protect foot  Tires: _6 pneumatic  _7 flat free inserts  _8 solid  _9 decrease maintenance  _10 prevent frequent flats _11 increase shock absorbency _12 decrease pain from road shock _13 decrease spasms from road shock _14  ?????  _15  Headrest  _16 provide posterior head support _17 provide posterior neck support _18 provide lateral head support _19 provide anterior head support _20 support during tilt and recline _21 improve feeding   _22 improve respiration _23 placement of switches _24 safety  _25 accommodate ROM  _26 accommodate tone _27 improve visual orientation  _28  Anterior chest strap _29  Vest _30  Shoulder retractors  _31 decrease forward movement of shoulder _32 accommodation of TLSO _33 decrease forward movement of trunk _34 decrease shoulder elevation _35 added abdominal support _36 alignment _37 assistance with shoulder control  _38  ?????  Pelvic Positioner _39 Belt _40 SubASIS bar _41 Dual Pull _42 stabilize tone _43 decrease falling out of chair/ **will not Decr potential for sliding due  to pelvic tilting _44 prevent excessive rotation _45 pad for protection over boney prominence _46 prominence comfort _47 special pull angle to control rotation _48  ?????  Upper Extremity Support _49 L   _50  R _51 Arm trough    _52 hand support _53  tray       _54 full tray _55 swivel mount _56 decrease edema      _57 decrease subluxation   _58 control tone   _59 placement for AAC/Computer/EADL _60 decrease gravitational pull on shoulders _61 provide midline positioning _62 provide support to increase UE function _63 provide hand support in natural position _64 provide work surface   POWER WHEELCHAIR CONTROLS  _65 Proportional  _66 Non-Proportional Type Steering Tiller _67 Left  _68 Right _69 provides access for controlling wheelchair   _70 lacks motor control to operate proportional drive control <UXLKGMWNUUVOZDGU>_4<\/QIHKVQQVZDGLOVFI>_43 unable to understand proportional controls  Actuator Control Module  _72 Single  _73 Multiple   _74 Allow the client to operate the power seat function(s) through the joystick control   _75 Safety Reset Switches _76 Used to change modes and stop the wheelchair when driving in latch mode    _77 [employer]   _78 programming for accurate control _79 progressive Disease/changing condition _80 non-proportional drive control needed _81 Needed in order to operate power seat functions through joystick control   _82 Display box _83 Allows user to see in which mode and drive the wheelchair is set  _84 necessary for alternate controls    _85 Digital interface electronics _86 Allows w/c to operate when using alternative drive controls  <PIRJJOACZYSAYTKZ>_6<\/WFUXNATFTDDUKGUR>_42 ASL Head Array _88 Allows client to operate wheelchair  through switches placed in tri-panel headrest  _89 Sip and puff with tubing kit _90 needed to operate sip and puff drive controls  <HCWCBJSEGBTDVVOH>_6<\/WVPXTGGYIRSWNIOE>_70 Upgraded tracking electronics _92 increase safety when driving <JJKKXFGHWEXHBZJI>_9<\/CVELFYBOFBPZWCHE>_52 correct tracking when on uneven surfaces  _94 [facility] for switches or joystick _95 Attaches switches to w/c  _96 Swing away for access or transfers _97 midline for optimal placement _98 provides for  consistent access  _99 Attendant controlled joystick plus mount _100 safety _101 long distance driving <DPOEUMPNTIRWERXV>_4<\/MGQQPYPPJKDTOIZT>_245 operation of seat functions _103 compliance with transportation regulations _104  ?????    Rear wheel placement/Axle adjustability _105 None _106 semi adjustable _107 fully adjustable  _108 improved UE access to wheels _109 improved stability _110 changing angle in space for improvement of postural stability _111 1-arm drive access <YKDXIPJASNKNLZJQ>_7<\/HALPFXTKWIOXBDZH>_299 amputee pad placement _113  ?????  Wheel rims/ hand rims  _114 metal  _115 plastic coated _116 oblique projections _117 vertical projections _118 Provide ability to propel manual wheelchair  _119  Increase self-propulsion with hand weakness/decreased grasp  Push handles _120 extended  _121 angle adjustable  _122 standard _123 caregiver access _124 caregiver assist _125 allows "hooking" to enable increased ability to perform ADLs or maintain balance  One armed device  _126 Lt   _127 Rt _128 enable propulsion of manual wheelchair with one arm   _129  ?????   Brake/wheel lock extension _130   Lt   _0  Rt _1 increase indep in applying wheel locks   _2 Side guards _3 prevent clothing getting caught in wheel or becoming soiled _4  prevent skin tears/abrasions  Battery: 12V x 12 AH x 2 _5 to power wheelchair ?????  Other: ????? ????? ?????  The above equipment has a life- long use expectancy. Growth and changes in medical and/or functional conditions would be the exceptions. This is to certify that the therapist has no financial relationship with durable medical provider or manufacturer. The therapist will not receive remuneration of any kind for the equipment recommended in this evaluation.   Patient has mobility limitation that significantly impairs safe, timely participation in one or more mobility related ADL's.  (bathing, toileting, feeding, dressing, grooming, moving from room to room)                                                             _6  Yes _7  No Will mobility device sufficiently improve ability to participate and/or be aided in  participation of MRADL's?         _8  Yes _9  No Can limitation be compensated for with use of a cane or walker?                                                                                _10  Yes _11  No Does patient or caregiver demonstrate ability/potential ability & willingness to safely use the mobility device?   _12  Yes _13  No Does patient's home environment support use of recommended mobility device?                                                    _14  Yes _15  No Does patient have sufficient upper extremity function necessary to functionally propel a manual wheelchair?    _16  Yes _17  No Does patient have sufficient strength and trunk stability to safely operate a POV (scooter)?                                  _18  Yes _19  No Does patient need additional features/benefits provided by a power wheelchair for MRADL's in the home?       _20  Yes _21  No Does the patient demonstrate the ability to safely use a power wheelchair?                                                              _22  Yes _23  No  Therapist Name Printed: Guido Sander, PT  Date: 09-30-16  Therapist's Signature:   Date:   Supplier's Name Printed: Fel[employer] Clinton,  ATP Date: 09-30-16  Supplier's Signature:   Date:  Patient/Caregiver Signature:   Date:     This is to certify that I have read this evaluation and do agree with the content within:      Physician's Name Printed: Pricilla Holm, MD  Physician's Signature:  Date:     This is to certify that I, the above signed therapist have the following affiliations: _0  This DME provider _1  Manufacturer of recommended equipment _2  Patient's long term care facility _3  None of the above                              Plan - 10/01/16 1958    Clinical Impression Statement Pt is an 83 yr old gentleman with CHF and COPD.  Pt is on 2L oxygen at rest and 3L with amb. but O2 sat rates drop into 80's with amb.  Pt is in need of power mobility to increase  independence and safety with mobility.  Pt is requesting a scooter rather than a power wheelchair.,   PT Frequency One time visit   PT Treatment/Interventions Other (comment)  wheelchair management   PT Next Visit Plan obtain a scooter from [facility] and Agree with Plan of Care Patient      Patient will benefit from skilled therapeutic intervention in order to improve the following deficits and impairments:  Cardiopulmonary status limiting activity, Difficulty walking, Decreased strength, Decreased mobility  Visit Diagnosis: Other abnormalities of gait and mobility - Plan: PT plan of care cert/re-cert      G-Codes - 12/06/14 2004    Functional Assessment Tool Used (Outpatient Only) pt unable to amb. without SOB - on 3L O2 and uses RW   Functional Limitation Mobility: Walking and moving around   Mobility: Walking and Moving Around Current Status [phone]) At least 80 percent but less than 100 percent impaired, limited or restricted   Mobility: Walking and Moving Around Goal Status [phone]) At least 80 percent but less than 100 percent impaired, limited or restricted   Mobility: Walking and Moving Around Discharge Status [phone]) At least 80 percent but less than 100 percent impaired, limited or restricted       Problem List Patient Active Problem List   Diagnosis Date Noted  . Goals of care, counseling/discussion 08/04/2016  . Anxiety state Jul 14, 202018  . Chronic respiratory failure ([place]) 07/21/2016  . Pneumonia 06/29/2016  . Acute sinusitis 06/26/2016  . Acute respiratory failure with hypoxia ([place]) 05/22/2016  . COPD exacerbation ([place]) 05/19/2016  . Diarrhea due to drug 01/28/2016  . Anemia in neoplastic disease 12/12/2015  . Acquired pancytopenia ([place]) 10/28/2015  . Tachy-brady syndrome ([place]) 07/10/2015  . Bilateral carotid artery stenosis 07/10/2015  . Essential hypertension 07/10/2015  . CML (chronic myeloid leukemia) ([place]) 06/26/2015  . Myeloproliferative neoplasm  ([place]) 06/25/2015  . Dizziness 05/27/2015  . Back pain 12/10/2014  . Obesity (BMI 30-39.9) 12/08/2014  . Hartmann's pouch of intestine ([place]) 12/08/2014  . Chronic diastolic heart failure ([place]) 11/24/2014  . Hyponatremia 11/22/2014  . GERD (gastroesophageal reflux disease) 03/27/2014  . Generalized weakness 02/08/2014  . Pacemaker 04/10/2013  . Obstructive sleep apnea 04/11/2011  . Type 2 diabetes mellitus, controlled ([place]) 01/18/2009  . Dyslipidemia 01/18/2009  . Hypertensive heart disease with CHF ([place]) 01/18/2009  . Coronary atherosclerosis 01/18/2009  . GERD 01/18/2009  . COPD mixed type ([place]) 12/27/2006    Sanika Brosious, [place], PT 10/01/2016, 8:10 PM  [facility] [address] [place], [place], [address] Phone: [phone]   Fax:  [phone]  Name: Eric [name] MRN: [id number] Date of Birth: January 18, 1927

## 2016-10-01 NOTE — Patient Outreach (Signed)
Telephone assessment:  Pt called me this morning before I could call him. He reports he thinks he maybe a little better. His sleeping has improved. His respiratory status is stable. He does conitnue to report SOB on exertion, walking a short distance can tire him out. He did have his electric W/C evaluation and will be receiving one. This will improve his quality of life.   I will call pt in 2 weeks and I have reinforced to him that he can call me.  [name] [facility] [phone]

## 2016-10-01 NOTE — Telephone Encounter (Signed)
Referral was placed for palliative care through hospice per recommendation of agency. Okay with verbals

## 2016-10-02 ENCOUNTER — Ambulatory Visit: Payer: Self-pay | Admitting: *Deleted

## 2016-10-02 NOTE — Telephone Encounter (Signed)
Contacted noel, she is aware

## 2016-10-05 ENCOUNTER — Telehealth: Payer: Self-pay | Admitting: Internal Medicine

## 2016-10-05 DIAGNOSIS — C9210 Chronic myeloid leukemia, BCR/ABL-positive, not having achieved remission: Secondary | ICD-10-CM | POA: Diagnosis not present

## 2016-10-05 DIAGNOSIS — J449 Chronic obstructive pulmonary disease, unspecified: Secondary | ICD-10-CM | POA: Diagnosis not present

## 2016-10-05 NOTE — Telephone Encounter (Signed)
Fine, but they will do symptom management.

## 2016-10-05 NOTE — Telephone Encounter (Signed)
Hospice is going to come out to see patient and care for him. They wanted the OKAY that Dr. Sharlet Salina would be the attending physician.  Give Verbal  To AMY:  [phone]

## 2016-10-06 ENCOUNTER — Telehealth: Payer: Self-pay

## 2016-10-06 DIAGNOSIS — J449 Chronic obstructive pulmonary disease, unspecified: Secondary | ICD-10-CM | POA: Diagnosis not present

## 2016-10-06 DIAGNOSIS — C9210 Chronic myeloid leukemia, BCR/ABL-positive, not having achieved remission: Secondary | ICD-10-CM | POA: Diagnosis not present

## 2016-10-06 NOTE — Telephone Encounter (Signed)
Dr. Clifton James Monguilod called from palliative/hospice care, stating patient requesting to hold off on hospice care till he gets his motor wheel chair, he is supposed to be getting it in about 2 weeks and Dr.Carlos wants the verbal of to just do a palliative care consult and will call us once he get the chair for the ok for hospice care, is it ok to give verbal?

## 2016-10-06 NOTE — Telephone Encounter (Signed)
Hospice has been informed.  

## 2016-10-06 NOTE — Telephone Encounter (Signed)
LVM with Dr.Carlos with verbal ok and to call back if he has any questions

## 2016-10-06 NOTE — Telephone Encounter (Signed)
Fine

## 2016-10-08 DIAGNOSIS — Z7951 Long term (current) use of inhaled steroids: Secondary | ICD-10-CM

## 2016-10-08 DIAGNOSIS — Z933 Colostomy status: Secondary | ICD-10-CM | POA: Diagnosis not present

## 2016-10-08 DIAGNOSIS — J449 Chronic obstructive pulmonary disease, unspecified: Secondary | ICD-10-CM | POA: Diagnosis not present

## 2016-10-08 DIAGNOSIS — M549 Dorsalgia, unspecified: Secondary | ICD-10-CM | POA: Diagnosis not present

## 2016-10-08 DIAGNOSIS — C9210 Chronic myeloid leukemia, BCR/ABL-positive, not having achieved remission: Secondary | ICD-10-CM | POA: Diagnosis not present

## 2016-10-08 DIAGNOSIS — Z7982 Long term (current) use of aspirin: Secondary | ICD-10-CM | POA: Diagnosis not present

## 2016-10-08 DIAGNOSIS — M1991 Primary osteoarthritis, unspecified site: Secondary | ICD-10-CM | POA: Diagnosis not present

## 2016-10-08 DIAGNOSIS — I110 Hypertensive heart disease with heart failure: Secondary | ICD-10-CM | POA: Diagnosis not present

## 2016-10-08 DIAGNOSIS — Z9181 History of falling: Secondary | ICD-10-CM | POA: Diagnosis not present

## 2016-10-08 DIAGNOSIS — I5032 Chronic diastolic (congestive) heart failure: Secondary | ICD-10-CM | POA: Diagnosis not present

## 2016-10-08 DIAGNOSIS — Z7984 Long term (current) use of oral hypoglycemic drugs: Secondary | ICD-10-CM | POA: Diagnosis not present

## 2016-10-08 DIAGNOSIS — E119 Type 2 diabetes mellitus without complications: Secondary | ICD-10-CM | POA: Diagnosis not present

## 2016-10-08 DIAGNOSIS — Z950 Presence of cardiac pacemaker: Secondary | ICD-10-CM | POA: Diagnosis not present

## 2016-10-09 ENCOUNTER — Telehealth: Payer: Self-pay | Admitting: *Deleted

## 2016-10-09 ENCOUNTER — Telehealth: Payer: Self-pay

## 2016-10-09 DIAGNOSIS — C9210 Chronic myeloid leukemia, BCR/ABL-positive, not having achieved remission: Secondary | ICD-10-CM | POA: Diagnosis not present

## 2016-10-09 DIAGNOSIS — J449 Chronic obstructive pulmonary disease, unspecified: Secondary | ICD-10-CM | POA: Diagnosis not present

## 2016-10-09 DIAGNOSIS — C61 Malignant neoplasm of prostate: Secondary | ICD-10-CM | POA: Diagnosis not present

## 2016-10-09 DIAGNOSIS — I25810 Atherosclerosis of coronary artery bypass graft(s) without angina pectoris: Secondary | ICD-10-CM | POA: Diagnosis not present

## 2016-10-09 DIAGNOSIS — C9290 Myeloid leukemia, unspecified, not having achieved remission: Secondary | ICD-10-CM | POA: Diagnosis not present

## 2016-10-09 DIAGNOSIS — K219 Gastro-esophageal reflux disease without esophagitis: Secondary | ICD-10-CM | POA: Diagnosis not present

## 2016-10-09 DIAGNOSIS — I10 Essential (primary) hypertension: Secondary | ICD-10-CM | POA: Diagnosis not present

## 2016-10-09 DIAGNOSIS — I495 Sick sinus syndrome: Secondary | ICD-10-CM | POA: Diagnosis not present

## 2016-10-09 DIAGNOSIS — J309 Allergic rhinitis, unspecified: Secondary | ICD-10-CM | POA: Diagnosis not present

## 2016-10-09 DIAGNOSIS — R0902 Hypoxemia: Secondary | ICD-10-CM | POA: Diagnosis not present

## 2016-10-09 DIAGNOSIS — E1159 Type 2 diabetes mellitus with other circulatory complications: Secondary | ICD-10-CM | POA: Diagnosis not present

## 2016-10-09 DIAGNOSIS — N183 Chronic kidney disease, stage 3 (moderate): Secondary | ICD-10-CM | POA: Diagnosis not present

## 2016-10-09 DIAGNOSIS — H11149 Conjunctival xerosis, unspecified, unspecified eye: Secondary | ICD-10-CM | POA: Diagnosis not present

## 2016-10-09 DIAGNOSIS — I739 Peripheral vascular disease, unspecified: Secondary | ICD-10-CM | POA: Diagnosis not present

## 2016-10-09 DIAGNOSIS — E785 Hyperlipidemia, unspecified: Secondary | ICD-10-CM | POA: Diagnosis not present

## 2016-10-09 DIAGNOSIS — I5030 Unspecified diastolic (congestive) heart failure: Secondary | ICD-10-CM | POA: Diagnosis not present

## 2016-10-09 NOTE — Telephone Encounter (Signed)
I am not aware he is on hospice Hospice patients should stop Sprycel. Please clarify with patient. If he agrees, we can also cancel his appt this month; or he can keep his appointment for goals of care discussion

## 2016-10-09 NOTE — Telephone Encounter (Signed)
Newman Nickels with hospice [place] called asking who is the "para source" for the sprycell. Pt is telling hospice that the manufacturer is paying for the sprycell. She is wondering about the copay. Please return call to the referral dept at hospice [phone]

## 2016-10-09 NOTE — Telephone Encounter (Signed)
With memory issues, taking Sprycel is dangerous unless under close supervision  I would not recommend refill at this time

## 2016-10-09 NOTE — Telephone Encounter (Signed)
Called patient to discuss sprycel and Hospice. Pt states he has 3 bottles left and wants to continue sprycel. RN left message for Hospice to call back. Pt will keep appt with Dr Alvy Bimler at the end of May

## 2016-10-09 NOTE — Telephone Encounter (Signed)
Received call from [facility]/hospice/[place] wanting to know who payer source is for Sprycel.  Reviewed chart & informed that pt is getting free from [employer] through 06/07/17.  Informed of Dr Alvy Bimler message that pt should not be on sprycel if Hospice pt.  She was unaware that he was hospice.  Informed that pt referred self & one visit has been made but not enrolled b/c he wanted to wait on medicare to pay for his scooter.  Hospice reports that he has called back asking for visit & reports that med is shipped to him free & they wanted to be sure he wasn't confused about this b/c he reports having trouble remembering to take meds.

## 2016-10-10 DIAGNOSIS — N183 Chronic kidney disease, stage 3 (moderate): Secondary | ICD-10-CM | POA: Diagnosis not present

## 2016-10-10 DIAGNOSIS — I5030 Unspecified diastolic (congestive) heart failure: Secondary | ICD-10-CM | POA: Diagnosis not present

## 2016-10-10 DIAGNOSIS — I25810 Atherosclerosis of coronary artery bypass graft(s) without angina pectoris: Secondary | ICD-10-CM | POA: Diagnosis not present

## 2016-10-10 DIAGNOSIS — I495 Sick sinus syndrome: Secondary | ICD-10-CM | POA: Diagnosis not present

## 2016-10-10 DIAGNOSIS — I739 Peripheral vascular disease, unspecified: Secondary | ICD-10-CM | POA: Diagnosis not present

## 2016-10-10 DIAGNOSIS — J449 Chronic obstructive pulmonary disease, unspecified: Secondary | ICD-10-CM | POA: Diagnosis not present

## 2016-10-12 ENCOUNTER — Inpatient Hospital Stay (HOSPITAL_COMMUNITY)
Admission: EM | Admit: 2016-10-12 | Discharge: 2016-10-15 | DRG: 291 | Disposition: A | Discharge status: Hospice | Attending: Internal Medicine | Admitting: Internal Medicine

## 2016-10-12 ENCOUNTER — Emergency Department (HOSPITAL_COMMUNITY)

## 2016-10-12 ENCOUNTER — Encounter (HOSPITAL_COMMUNITY): Payer: Self-pay

## 2016-10-12 DIAGNOSIS — I509 Heart failure, unspecified: Secondary | ICD-10-CM | POA: Diagnosis not present

## 2016-10-12 DIAGNOSIS — C9210 Chronic myeloid leukemia, BCR/ABL-positive, not having achieved remission: Secondary | ICD-10-CM | POA: Diagnosis not present

## 2016-10-12 DIAGNOSIS — D630 Anemia in neoplastic disease: Secondary | ICD-10-CM | POA: Diagnosis not present

## 2016-10-12 DIAGNOSIS — I110 Hypertensive heart disease with heart failure: Secondary | ICD-10-CM | POA: Diagnosis not present

## 2016-10-12 DIAGNOSIS — I25810 Atherosclerosis of coronary artery bypass graft(s) without angina pectoris: Secondary | ICD-10-CM | POA: Diagnosis not present

## 2016-10-12 DIAGNOSIS — E785 Hyperlipidemia, unspecified: Secondary | ICD-10-CM | POA: Diagnosis present

## 2016-10-12 DIAGNOSIS — J449 Chronic obstructive pulmonary disease, unspecified: Secondary | ICD-10-CM | POA: Diagnosis not present

## 2016-10-12 DIAGNOSIS — Z79899 Other long term (current) drug therapy: Secondary | ICD-10-CM

## 2016-10-12 DIAGNOSIS — Z9981 Dependence on supplemental oxygen: Secondary | ICD-10-CM

## 2016-10-12 DIAGNOSIS — I5033 Acute on chronic diastolic (congestive) heart failure: Secondary | ICD-10-CM | POA: Diagnosis present

## 2016-10-12 DIAGNOSIS — J9601 Acute respiratory failure with hypoxia: Secondary | ICD-10-CM | POA: Diagnosis not present

## 2016-10-12 DIAGNOSIS — Z8546 Personal history of malignant neoplasm of prostate: Secondary | ICD-10-CM

## 2016-10-12 DIAGNOSIS — G4733 Obstructive sleep apnea (adult) (pediatric): Secondary | ICD-10-CM | POA: Diagnosis present

## 2016-10-12 DIAGNOSIS — I739 Peripheral vascular disease, unspecified: Secondary | ICD-10-CM | POA: Diagnosis not present

## 2016-10-12 DIAGNOSIS — Z888 Allergy status to other drugs, medicaments and biological substances status: Secondary | ICD-10-CM

## 2016-10-12 DIAGNOSIS — J9621 Acute and chronic respiratory failure with hypoxia: Secondary | ICD-10-CM | POA: Diagnosis present

## 2016-10-12 DIAGNOSIS — Z951 Presence of aortocoronary bypass graft: Secondary | ICD-10-CM

## 2016-10-12 DIAGNOSIS — Z7982 Long term (current) use of aspirin: Secondary | ICD-10-CM

## 2016-10-12 DIAGNOSIS — Z87891 Personal history of nicotine dependence: Secondary | ICD-10-CM | POA: Diagnosis not present

## 2016-10-12 DIAGNOSIS — I10 Essential (primary) hypertension: Secondary | ICD-10-CM

## 2016-10-12 DIAGNOSIS — I2510 Atherosclerotic heart disease of native coronary artery without angina pectoris: Secondary | ICD-10-CM | POA: Diagnosis present

## 2016-10-12 DIAGNOSIS — Z6832 Body mass index (BMI) 32.0-32.9, adult: Secondary | ICD-10-CM

## 2016-10-12 DIAGNOSIS — Z885 Allergy status to narcotic agent status: Secondary | ICD-10-CM

## 2016-10-12 DIAGNOSIS — Z8249 Family history of ischemic heart disease and other diseases of the circulatory system: Secondary | ICD-10-CM

## 2016-10-12 DIAGNOSIS — J90 Pleural effusion, not elsewhere classified: Secondary | ICD-10-CM

## 2016-10-12 DIAGNOSIS — I5030 Unspecified diastolic (congestive) heart failure: Secondary | ICD-10-CM | POA: Diagnosis not present

## 2016-10-12 DIAGNOSIS — J9611 Chronic respiratory failure with hypoxia: Secondary | ICD-10-CM | POA: Diagnosis present

## 2016-10-12 DIAGNOSIS — Z66 Do not resuscitate: Secondary | ICD-10-CM | POA: Diagnosis present

## 2016-10-12 DIAGNOSIS — E210 Primary hyperparathyroidism: Secondary | ICD-10-CM | POA: Diagnosis present

## 2016-10-12 DIAGNOSIS — I255 Ischemic cardiomyopathy: Secondary | ICD-10-CM | POA: Diagnosis present

## 2016-10-12 DIAGNOSIS — Z515 Encounter for palliative care: Secondary | ICD-10-CM | POA: Diagnosis present

## 2016-10-12 DIAGNOSIS — E669 Obesity, unspecified: Secondary | ICD-10-CM | POA: Diagnosis present

## 2016-10-12 DIAGNOSIS — N183 Chronic kidney disease, stage 3 (moderate): Secondary | ICD-10-CM | POA: Diagnosis not present

## 2016-10-12 DIAGNOSIS — D61818 Other pancytopenia: Secondary | ICD-10-CM | POA: Diagnosis not present

## 2016-10-12 DIAGNOSIS — R0902 Hypoxemia: Secondary | ICD-10-CM | POA: Diagnosis not present

## 2016-10-12 DIAGNOSIS — Z950 Presence of cardiac pacemaker: Secondary | ICD-10-CM

## 2016-10-12 DIAGNOSIS — J441 Chronic obstructive pulmonary disease with (acute) exacerbation: Secondary | ICD-10-CM | POA: Diagnosis present

## 2016-10-12 DIAGNOSIS — E739 Lactose intolerance, unspecified: Secondary | ICD-10-CM | POA: Diagnosis present

## 2016-10-12 DIAGNOSIS — E1151 Type 2 diabetes mellitus with diabetic peripheral angiopathy without gangrene: Secondary | ICD-10-CM | POA: Diagnosis present

## 2016-10-12 DIAGNOSIS — K219 Gastro-esophageal reflux disease without esophagitis: Secondary | ICD-10-CM | POA: Diagnosis present

## 2016-10-12 DIAGNOSIS — R05 Cough: Secondary | ICD-10-CM | POA: Diagnosis not present

## 2016-10-12 DIAGNOSIS — I495 Sick sinus syndrome: Secondary | ICD-10-CM | POA: Diagnosis not present

## 2016-10-12 DIAGNOSIS — J9620 Acute and chronic respiratory failure, unspecified whether with hypoxia or hypercapnia: Secondary | ICD-10-CM | POA: Diagnosis not present

## 2016-10-12 HISTORY — DX: Dependence on supplemental oxygen: Z99.81

## 2016-10-12 LAB — COMPREHENSIVE METABOLIC PANEL
ALBUMIN: 3.9 g/dL (ref 3.5–5.0)
ALT: 12 U/L — AB (ref 17–63)
AST: 22 U/L (ref 15–41)
Alkaline Phosphatase: 60 U/L (ref 38–126)
Anion gap: 8 (ref 5–15)
BUN: 16 mg/dL (ref 6–20)
CHLORIDE: 108 mmol/L (ref 101–111)
CO2: 24 mmol/L (ref 22–32)
CREATININE: 1.21 mg/dL (ref 0.61–1.24)
Calcium: 9 mg/dL (ref 8.9–10.3)
GFR calc Af Amer: >60 mL/min (ref >60)
GFR calc non Af Amer: 52 mL/min — ABNORMAL LOW (ref >60)
GLUCOSE: 138 mg/dL — AB (ref 65–99)
Potassium: 3.5 mmol/L (ref 3.5–5.1)
Sodium: 140 mmol/L (ref 135–145)
TOTAL PROTEIN: 7.2 g/dL (ref 6.5–8.1)
Total Bilirubin: 0.7 mg/dL (ref 0.3–1.2)

## 2016-10-12 LAB — CBC
HCT: 24 % — ABNORMAL LOW (ref 39.0–52.0)
Hemoglobin: 7.6 g/dL — ABNORMAL LOW (ref 13.0–17.0)
MCH: 32.3 pg (ref 26.0–34.0)
MCHC: 31.7 g/dL (ref 30.0–36.0)
MCV: 102.1 fL — AB (ref 78.0–100.0)
PLATELETS: 149 10*3/uL — AB (ref 150–400)
RBC: 2.35 MIL/uL — AB (ref 4.22–5.81)
RDW: 15.3 % (ref 11.5–15.5)
WBC: 4.3 10*3/uL (ref 4.0–10.5)

## 2016-10-12 LAB — I-STAT TROPONIN, ED: Troponin i, poc: 0.05 ng/mL (ref 0.00–0.08)

## 2016-10-12 LAB — BRAIN NATRIURETIC PEPTIDE: B NATRIURETIC PEPTIDE 5: 496 pg/mL — AB (ref 0.0–100.0)

## 2016-10-12 MED ORDER — ALBUTEROL SULFATE (2.5 MG/3ML) 0.083% IN NEBU
2.5000 mg | INHALATION_SOLUTION | Freq: Four times a day (QID) | RESPIRATORY_TRACT | Status: DC
  Administered 2016-10-13 – 2016-10-15 (×10): 2.5 mg via RESPIRATORY_TRACT
  Filled 2016-10-12 (×10): qty 3

## 2016-10-12 MED ORDER — HYDROCODONE-ACETAMINOPHEN 7.5-325 MG PO TABS
1.0000 | ORAL_TABLET | Freq: Four times a day (QID) | ORAL | Status: DC | PRN
  Administered 2016-10-12 – 2016-10-13 (×2): 1 via ORAL
  Filled 2016-10-12 (×4): qty 1

## 2016-10-12 MED ORDER — ALBUTEROL SULFATE (2.5 MG/3ML) 0.083% IN NEBU
2.5000 mg | INHALATION_SOLUTION | RESPIRATORY_TRACT | Status: DC
  Administered 2016-10-12: 2.5 mg via RESPIRATORY_TRACT
  Filled 2016-10-12: qty 3

## 2016-10-12 MED ORDER — FAMOTIDINE 20 MG PO TABS
10.0000 mg | ORAL_TABLET | Freq: Every day | ORAL | Status: DC
  Administered 2016-10-12 – 2016-10-15 (×4): 10 mg via ORAL
  Filled 2016-10-12 (×4): qty 1

## 2016-10-12 MED ORDER — LORAZEPAM 2 MG/ML IJ SOLN
0.5000 mg | Freq: Once | INTRAMUSCULAR | Status: AC
  Administered 2016-10-12: 0.5 mg via INTRAVENOUS
  Filled 2016-10-12: qty 1

## 2016-10-12 MED ORDER — SODIUM CHLORIDE 0.9% FLUSH: 3.0000 mL | INTRAVENOUS | Status: DC | PRN

## 2016-10-12 MED ORDER — ALBUTEROL SULFATE (2.5 MG/3ML) 0.083% IN NEBU
5.0000 mg | INHALATION_SOLUTION | Freq: Once | RESPIRATORY_TRACT | Status: AC
  Administered 2016-10-12: 5 mg via RESPIRATORY_TRACT
  Filled 2016-10-12: qty 6

## 2016-10-12 MED ORDER — GUAIFENESIN-DM 100-10 MG/5ML PO SYRP
5.0000 mL | ORAL_SOLUTION | ORAL | Status: DC | PRN
  Administered 2016-10-12 – 2016-10-14 (×3): 5 mL via ORAL
  Filled 2016-10-12 (×3): qty 5

## 2016-10-12 MED ORDER — ENOXAPARIN SODIUM 30 MG/0.3ML ~~LOC~~ SOLN
30.0000 mg | SUBCUTANEOUS | Status: DC
  Administered 2016-10-12 – 2016-10-14 (×2): 30 mg via SUBCUTANEOUS
  Filled 2016-10-12 (×3): qty 0.3

## 2016-10-12 MED ORDER — ASPIRIN EC 81 MG PO TBEC
81.0000 mg | DELAYED_RELEASE_TABLET | Freq: Every evening | ORAL | Status: DC
  Administered 2016-10-12: 81 mg via ORAL
  Filled 2016-10-12 (×3): qty 1

## 2016-10-12 MED ORDER — FUROSEMIDE 20 MG PO TABS: 40.0000 mg | ORAL_TABLET | Freq: Two times a day (BID) | ORAL | Status: DC

## 2016-10-12 MED ORDER — DASATINIB 70 MG PO TABS: 70.0000 mg | ORAL_TABLET | Freq: Every day | ORAL | Status: DC

## 2016-10-12 MED ORDER — FUROSEMIDE 10 MG/ML IJ SOLN
40.0000 mg | Freq: Once | INTRAMUSCULAR | Status: AC
  Administered 2016-10-12: 40 mg via INTRAVENOUS
  Filled 2016-10-12: qty 4

## 2016-10-12 MED ORDER — UMECLIDINIUM-VILANTEROL 62.5-25 MCG/INH IN AEPB
1.0000 | INHALATION_SPRAY | Freq: Every day | RESPIRATORY_TRACT | Status: DC
  Administered 2016-10-13 – 2016-10-15 (×3): 1 via RESPIRATORY_TRACT
  Filled 2016-10-12: qty 14

## 2016-10-12 MED ORDER — PREDNISONE 20 MG PO TABS
40.0000 mg | ORAL_TABLET | Freq: Once | ORAL | Status: AC
  Administered 2016-10-12: 40 mg via ORAL
  Filled 2016-10-12: qty 2

## 2016-10-12 MED ORDER — FUROSEMIDE 10 MG/ML IJ SOLN: 40.0000 mg | Freq: Two times a day (BID) | INTRAMUSCULAR | Status: DC

## 2016-10-12 MED ORDER — FUROSEMIDE 10 MG/ML IJ SOLN
80.0000 mg | Freq: Two times a day (BID) | INTRAMUSCULAR | Status: DC
  Administered 2016-10-12 – 2016-10-14 (×5): 80 mg via INTRAVENOUS
  Filled 2016-10-12 (×5): qty 8

## 2016-10-12 MED ORDER — FUROSEMIDE 20 MG PO TABS: 40.0000 mg | ORAL_TABLET | Freq: Once | ORAL | Status: DC

## 2016-10-12 MED ORDER — PRAVASTATIN SODIUM 40 MG PO TABS: 60.0000 mg | ORAL_TABLET | Freq: Every day | ORAL | Status: DC

## 2016-10-12 MED ORDER — PRAVASTATIN SODIUM 20 MG PO TABS
60.0000 mg | ORAL_TABLET | Freq: Every day | ORAL | Status: DC
  Administered 2016-10-12 – 2016-10-15 (×4): 60 mg via ORAL
  Filled 2016-10-12 (×4): qty 3

## 2016-10-12 MED ORDER — IRBESARTAN 150 MG PO TABS
150.0000 mg | ORAL_TABLET | Freq: Every day | ORAL | Status: DC
  Administered 2016-10-12 – 2016-10-15 (×4): 150 mg via ORAL
  Filled 2016-10-12 (×4): qty 1

## 2016-10-12 MED ORDER — SODIUM CHLORIDE 0.9% FLUSH
3.0000 mL | Freq: Two times a day (BID) | INTRAVENOUS | Status: DC
  Administered 2016-10-12 – 2016-10-13 (×3): 3 mL via INTRAVENOUS

## 2016-10-12 MED ORDER — SODIUM CHLORIDE 0.9 % IV SOLN: 250.0000 mL | INTRAVENOUS | Status: DC | PRN

## 2016-10-12 MED ORDER — IPRATROPIUM-ALBUTEROL 18-103 MCG/ACT IN AERO: 1.0000 | INHALATION_SPRAY | Freq: Four times a day (QID) | RESPIRATORY_TRACT | Status: DC | PRN

## 2016-10-12 MED ORDER — ACETAMINOPHEN 325 MG PO TABS
650.0000 mg | ORAL_TABLET | ORAL | Status: DC | PRN
  Administered 2016-10-12: 650 mg via ORAL
  Filled 2016-10-12: qty 2

## 2016-10-12 MED ORDER — ONDANSETRON HCL 4 MG/2ML IJ SOLN: 4.0000 mg | Freq: Four times a day (QID) | INTRAMUSCULAR | Status: DC | PRN

## 2016-10-12 MED ORDER — ENSURE ENLIVE PO LIQD
237.0000 mL | Freq: Two times a day (BID) | ORAL | Status: DC
  Administered 2016-10-12 – 2016-10-15 (×6): 237 mL via ORAL

## 2016-10-12 MED ORDER — IPRATROPIUM-ALBUTEROL 0.5-2.5 (3) MG/3ML IN SOLN
3.0000 mL | Freq: Four times a day (QID) | RESPIRATORY_TRACT | Status: DC | PRN
  Administered 2016-10-12 – 2016-10-14 (×3): 3 mL via RESPIRATORY_TRACT
  Filled 2016-10-12 (×3): qty 3

## 2016-10-12 NOTE — ED Notes (Signed)
Attempted report to 5W. 

## 2016-10-12 NOTE — ED Notes (Signed)
Pt given water per Dr. Jeanell Sparrow

## 2016-10-12 NOTE — ED Provider Notes (Signed)
[place] DEPT Provider Note   CSN: [id number] Arrival date & time: 10/12/16  0809     History   Chief Complaint Chief Complaint  Patient presents with  . Shortness of Breath    HPI JAYZEN PAVER is a 83 y.o. male.  HPI 83 year old man CHF, CML, cardiomyopathy, anemia presents today with increasing dyspnea. Patient reports gradual and worsening symptoms. He denies any active chest pain. Past Medical History:  Diagnosis Date  . ALLERGIC RHINITIS   . ANEMIA-NOS   . AORTIC SCLEROSIS   . Asthma   . CARDIOMYOPATHY, ISCHEMIC   . CAROTID BRUIT, RIGHT 02/27/2008  . Cataract    surgery  . CML (chronic myeloid leukemia) ([place]) 06/26/2015  . COPD   . CORONARY ARTERY DISEASE    a. s/p CABG in 1995 b. DES in 2008, 2009, and most recent in 2012 with DES to SVG-OM  . DIABETES MELLITUS-TYPE II    diet controlled  . Diastolic dysfunction, Grade 1 11/24/2014  . Diverticulitis of colon with perforation 11/22/2014  . Diverticulosis   . GERD   . HIATAL HERNIA   . Hx of echocardiogram    Echo (9/15):  Mild LVH, EF 50-55%, no RWMA, Gr 1 DD, MAC, mild LAE.  [name] HYPERLIPIDEMIA   . HYPERTENSION   . Hyponatremia 11/22/2014  . IBS (irritable bowel syndrome)   . LACTOSE INTOLERANCE   . OA (osteoarthritis)   . OBESITY   . Partial small bowel obstruction ([place])   . PERIPHERAL VASCULAR DISEASE   . Primary hyperparathyroidism ([place])    Lab Results Component Value Date  PTH 150.7* 02/13/2013  CALCIUM 11.0* 02/13/2013  CAION 1.21 03/15/2008    . Prostate cancer ([place])    seed implants 2004  . SICK SINUS/ TACHY-BRADY SYNDROME 09/2007   s/p PPM st judes  . Sleep apnea   . SMALL BOWEL OBSTRUCTION 04/18/2009   Qualifier: History of  By: Asa Lente MD, Jannifer Rodney Symptomatic diverticulosis 01/18/2009   Qualifier: Diagnosis of  By: Shane Crutch, Amy S     Patient Active Problem List   Diagnosis Date Noted  . Goals of care, counseling/discussion 08/04/2016  . Anxiety state 07/30/202018  . Chronic  respiratory failure ([place]) 07/21/2016  . Pneumonia 06/29/2016  . Acute sinusitis 06/26/2016  . Acute respiratory failure with hypoxia ([place]) 05/22/2016  . COPD exacerbation ([place]) 05/19/2016  . Diarrhea due to drug 01/28/2016  . Anemia in neoplastic disease 12/12/2015  . Acquired pancytopenia ([place]) 10/28/2015  . Tachy-brady syndrome ([place]) 07/10/2015  . Bilateral carotid artery stenosis 07/10/2015  . Essential hypertension 07/10/2015  . CML (chronic myeloid leukemia) ([place]) 06/26/2015  . Myeloproliferative neoplasm ([place]) 06/25/2015  . Dizziness 05/27/2015  . Back pain 12/10/2014  . Obesity (BMI 30-39.9) 12/08/2014  . Hartmann's pouch of intestine ([place]) 12/08/2014  . Chronic diastolic heart failure ([place]) 11/24/2014  . Hyponatremia 11/22/2014  . GERD (gastroesophageal reflux disease) 03/27/2014  . Generalized weakness 02/08/2014  . Pacemaker 04/10/2013  . Obstructive sleep apnea 04/11/2011  . Type 2 diabetes mellitus, controlled ([place]) 01/18/2009  . Dyslipidemia 01/18/2009  . Hypertensive heart disease with CHF ([place]) 01/18/2009  . Coronary atherosclerosis 01/18/2009  . GERD 01/18/2009  . COPD mixed type ([place]) 12/27/2006    Past Surgical History:  Procedure Laterality Date  . BACK SURGERY    . Bilateral cataracts    . COLON RESECTION N/A 11/28/2014   Procedure: EXPLORATORY LAPAROTOMY, SIGMOID COLECTOMY WITH COLOSTOMY;  Surgeon: Jackolyn Confer, MD;  Location: [facility];  Service: General;  Laterality: N/A;  . COLON SURGERY    . COLONOSCOPY    . CORONARY ARTERY BYPASS GRAFT    . ESOPHAGOGASTRODUODENOSCOPY  multiple  . FLEXIBLE SIGMOIDOSCOPY N/A 09/22/2013   Procedure: FLEXIBLE SIGMOIDOSCOPY;  Surgeon: Gatha Mayer, MD;  Location: [facility] ENDOSCOPY;  Service: Endoscopy;  Laterality: N/A;  . INGUINAL HERNIA REPAIR Bilateral   . LUMBAR [place] SURGERY  12/2008  . PACEMAKER INSERTION     DDD/[employer]         Last interrogation 2/13  on chart     Pacemaker guideline order Dr Tamala Julian on chart    . Partial small bowel obstruction  2009  . PENILE PROSTHESIS PLACEMENT    . PTCA  2008, 2009, 2012   with DES  . TOTAL HIP ARTHROPLASTY  08/21/2011   Procedure: TOTAL HIP ARTHROPLASTY;  Surgeon: Johnn Hai, MD;  Location: [facility] ORS;  Service: Orthopedics;  Laterality: Right;       Home Medications    Prior to Admission medications   Medication Sig Start Date End Date Taking? Authorizing Provider  acetaminophen (TYLENOL) 500 MG tablet Take 1,000 mg by mouth every 6 (six) hours as needed for mild pain or fever.   Yes [provider]  albuterol (PROVENTIL HFA) 108 (90 Base) MCG/ACT inhaler Inhale 2 puffs into the lungs every 4 (four) hours as needed for shortness of breath. Wheezing 06/09/16  Yes [name], MD  albuterol (PROVENTIL) (2.5 MG/3ML) 0.083% nebulizer solution USE 1 VIAL WITH NEBULIZER EVERY 4 HOURS AS NEEDED FOR WHEEZING 11/25/15  Yes Young, Clinton D, MD  albuterol-ipratropium (COMBIVENT) 18-103 MCG/ACT inhaler Inhale 1 puff into the lungs every 6 (six) hours as needed for wheezing or shortness of breath.   Yes [provider]  aspirin 81 MG tablet Take 81 mg by mouth every evening.    Yes [provider]  dasatinib (SPRYCEL) 70 MG tablet Take 1 tablet (70 mg total) by mouth daily. 08/10/16  Yes Gorsuch, Ni, MD  fluticasone (FLONASE) 50 MCG/ACT nasal spray Place 2 sprays into both nostrils daily. Allergies 08/01/14  Yes [name], MD  hydrochlorothiazide (MICROZIDE) 12.5 MG capsule Take 12.5 mg by mouth daily.   Yes [provider]  HYDROcodone-acetaminophen (NORCO) 7.5-325 MG tablet Take 1 tablet by mouth every 6 (six) hours as needed for moderate pain.   Yes [provider]  nitroGLYCERIN (NITROSTAT) 0.4 MG SL tablet Place 0.4 mg under the tongue every 5 (five) minutes as needed for chest pain. Reported on 12/12/2015   Yes [provider]  Polyethyl Glycol-Propyl Glycol (SYSTANE) 0.4-0.3 % SOLN Apply 2 drops  to eye 3 (three) times daily as needed (dry eyes).    Yes [provider]  pravastatin (PRAVACHOL) 20 MG tablet Take 3 tablets (60 mg total) by mouth daily. 08/01/14  Yes [name], MD  ranitidine (ZANTAC) 300 MG tablet Take 300 mg by mouth at bedtime.    Yes [provider]  triamcinolone cream (KENALOG) 0.1 % Apply 1 application topically 3 (three) times daily as needed (dry skin.).  07/19/15  Yes [provider]  valsartan (DIOVAN) 160 MG tablet Take 1 tablet (160 mg total) by mouth daily. 08/01/14  Yes [name], MD  diphenoxylate-atropine (LOMOTIL) 2.5-0.025 MG tablet Take 1 tablet by mouth 4 (four) times daily as needed for diarrhea or loose stools. Patient not taking: Reported on 10/12/2016 08/21/16   [name], MD  feeding supplement, ENSURE ENLIVE, (ENSURE ENLIVE) LIQD Take 237 mLs by mouth 2 (two) times daily between meals. 06/23/16   [name], MD  furosemide (LASIX) 40 MG tablet Take 1 tablet (40 mg total) by mouth daily. Patient not taking: Reported on 10/12/2016 06/11/16   [name], MD  guaiFENesin (MUCINEX) 600 MG 12 hr tablet Take 1 tablet (600 mg total) by mouth 2 (two) times daily. Refills: 12 Patient not taking: Reported on 10/12/2016 09/17/16 10/17/16  [name], NP  guaiFENesin-dextromethorphan (ROBITUSSIN DM) 100-10 MG/5ML syrup Take 5 mLs by mouth every 4 (four) hours as needed for cough. Patient not taking: Reported on 10/12/2016 06/06/16   Annita Brod, MD  umeclidinium-vilanterol [facility] ELLIPTA) 62.5-25 MCG/INH AEPB Inhale 1 puff into the lungs daily. 07/21/16   Parrett, Fonnie Mu, NP    Family History Family History  Problem Relation Age of Onset  . Hypertension Mother   . Cancer Mother   . Heart attack    . Heart attack    . Heart attack Brother   . Colon cancer Neg Hx   . Stroke Neg Hx     Social History Social History  Substance Use Topics  . Smoking status: Former Smoker     Packs/day: 1.00    Years: 25.00    Types: Cigarettes    Quit date: 06/08/1994  . Smokeless tobacco: Never Used  . Alcohol use No     Allergies   Actos [pioglitazone hydrochloride]; Buprenorphine hcl; Celebrex [celecoxib]; Demerol; Meperidine; Morphine and related; Ciprofloxacin; Metformin; and Zocor [simvastatin]   Review of Systems Review of Systems  Constitutional: Positive for activity change, appetite change and fatigue.  HENT: Negative.   Eyes: Negative.   Respiratory: Positive for chest tightness and shortness of breath.   Cardiovascular: Positive for leg swelling.  Gastrointestinal: Negative.   Endocrine: Negative.   Genitourinary: Negative.   Musculoskeletal: Negative.   Neurological: Negative.   Hematological: Negative.   Psychiatric/Behavioral: Negative.   All other systems reviewed and are negative.    Physical Exam Updated Vital Signs BP (!) 152/55   Pulse 91   Resp (!) 33   Ht 5' 6"  (1.676 m)   Wt 95.7 kg   SpO2 93%   BMI 34.06 kg/m   Physical Exam  Constitutional: He appears well-developed and well-nourished. He appears distressed.  Chronically ill-appearing elderly male who appears to be in some respiratory distress  HENT:  Head: Normocephalic and atraumatic.  Eyes: Pupils are equal, round, and reactive to light.  Neck: Normal range of motion.  Cardiovascular: Normal rate and regular rhythm.   Pulmonary/Chest:  Increased wob, crackles bilaterally  Abdominal: Soft. He exhibits distension. There is no tenderness. There is no guarding.  Musculoskeletal: He exhibits edema. He exhibits no deformity.  Neurological: He is alert. No cranial nerve deficit. Coordination normal.  Skin: Skin is warm. Capillary refill takes less than 2 seconds.  Psychiatric: His mood appears anxious.  Vitals reviewed.    ED Treatments / Results  Labs (all labs ordered are listed, but only abnormal results are displayed) Labs Reviewed  CBC - Abnormal; Notable for the  following:       Result Value   RBC 2.35 (*)    Hemoglobin 7.6 (*)    HCT 24.0 (*)    MCV 102.1 (*)    Platelets 149 (*)    All other components within normal limits  COMPREHENSIVE METABOLIC PANEL - Abnormal; Notable for the following:    Glucose,  Bld 138 (*)    ALT 12 (*)    GFR calc non Af Amer 52 (*)    All other components within normal limits  BRAIN NATRIURETIC PEPTIDE - Abnormal; Notable for the following:    B Natriuretic Peptide 496.0 (*)    All other components within normal limits  I-STAT TROPOININ, ED    EKG  EKG Interpretation  Date/Time:  Monday Oct 12 2016 08:27:16 EDT Ventricular Rate:  93 PR Interval:    QRS Duration: 99 QT Interval:  391 QTC Calculation: 487 R Axis:   -49 Text Interpretation:  Sinus rhythm Left anterior fascicular block Consider anterior infarct Confirmed by Larin Depaoli MD, Andee Poles [phone]) on 10/12/2016 11:50:06 AM       Radiology Dg Chest Port 1 View  Result Date: 10/12/2016 CLINICAL DATA:  Cough and dyspnea EXAM: PORTABLE CHEST 1 VIEW COMPARISON:  07/01/2013 FINDINGS: Progressive bilateral pleural effusions which are now moderate in size. Progressive bibasilar atelectasis. Pulmonary vascular congestion. Mild pulmonary edema has progressed in the interval. CABG with dual lead pacemaker IMPRESSION: Progressive heart failure with edema and enlarging bilateral effusions. Progression of bibasilar atelectasis. Electronically Signed   By: Franchot Gallo M.D.   On: 10/12/2016 09:07    Procedures Procedures (including critical care time)  Medications Ordered in ED Medications  albuterol (PROVENTIL) (2.5 MG/3ML) 0.083% nebulizer solution 5 mg (5 mg Nebulization Given 10/12/16 0915)  furosemide (LASIX) injection 40 mg (40 mg Intravenous Given 10/12/16 0916)  predniSONE (DELTASONE) tablet 40 mg (40 mg Oral Given 10/12/16 0916)  LORazepam (ATIVAN) injection 0.5 mg (0.5 mg Intravenous Given 10/12/16 0916)     Initial Impression / Assessment and Plan / ED Course   I have reviewed the triage vital signs and the nursing notes.  Pertinent labs & imaging results that were available during my care of the patient were reviewed by me and considered in my medical decision making (see chart for details).     chf- lasix given Anemia-  Discussed with hospice nurse. Patient has DO NOT RESUSCITATE at bedside. He has been placed on hospice care. They are currently unable to give him an inpatient hospice bed. He is unable to care for himself in his independent living care facility. Plan admission for palliative care for CHF and symptomatic anemia.   Final Clinical Impressions(s) / ED Diagnoses   Final diagnoses:  CHF (congestive heart failure) ([place])  Pleural effusion    New Prescriptions New Prescriptions   No medications on file     Pattricia Boss, MD 10/13/16 1122

## 2016-10-12 NOTE — Progress Notes (Signed)
[facility] ED- A-07- [facility]- ED RN Visit  This patient was admitted to [facility] hospice services on 10/09/16 for diagnosis of COPD. Patient is a DNR.  Visited patient in room, with no family present.  Patient alert, oriented and short of breath in conversation.  Congested cough noted.  Patient reported activating EMS after experiencing difficulty breathing in the home.  He stated he called hospice, but decided he did not want to wait for a nurse to come to the home to evaluate.  He reports that he has 4 daughters locally, however, he stated they do not consistently assist with his care.  He voiced that he wants to die and feels his time is very limited. Unfortunately, there is no bed availability at [facility]. Unsure at this time if patient would meet inpatient criteria.  Spoke to [employer] social worker, Debbie who plans to visit patient today.  Left contact information with patient if any hospice-related questions or concerns arise.  [facility] will continue to follow daily.  Thank you, Freddi Starr RN, [facility] Liaison [phone]

## 2016-10-12 NOTE — H&P (Signed)
History and Physical    Eric [name] WJX:[id number] DOB: 1927-01-07 DOA: 10/12/2016  PCP: [name], MD Patient coming from: facility  Chief Complaint: sob  HPI: Eric [name] is a 83 y.o. male with medical history significant chronic respiratory failure on 2 L oxygen via nasal cannula at home related to CHF and COPD, CML, hypertension presents to the emergency Department chief complaint sudden worsening shortness of breath. Initial evaluation reveals acute on chronic respiratory failure likely related to acute on chronic diastolic heart failure with worsening bilateral pleural effusions in the setting of mild COPD exacerbation.  Information is obtained from the patient and the chart. He states he was awakened from his sleep around 4:30 with sudden onset shortness of breath. He denies chest pain palpitations fever chills recent travel or sick contacts. He denies headache dizziness syncope or near-syncope. He notes that his lower extremity edema may be worsening. He is unable to bear weight get out of bed due to generalized weakness. He denies abdominal pain nausea vomiting diarrhea constipation melena bright red blood per rectum. He denies dysuria hematuria frequency or urgency.    ED Course: In the emergency department he is afebrile hemodynamically stable tachypnea increased work of breathing. He is provided with 40 mg Lasix IV as well as nebulizers  Review of Systems: As per HPI otherwise all other systems reviewed and are negative.   Ambulatory Status:unable to bear weight. Needs assistance. Recent [facility] but not signed up yet  Past Medical History:  Diagnosis Date  . ALLERGIC RHINITIS   . ANEMIA-NOS   . AORTIC SCLEROSIS   . Asthma   . CARDIOMYOPATHY, ISCHEMIC   . CAROTID BRUIT, RIGHT 02/27/2008  . Cataract    surgery  . CML (chronic myeloid leukemia) ([place]) 06/26/2015  . COPD   . CORONARY ARTERY DISEASE    a. s/p CABG in 1995 b. DES in 2008, 2009,  and most recent in 2012 with DES to SVG-OM  . DIABETES MELLITUS-TYPE II    diet controlled  . Diastolic dysfunction, Grade 1 11/24/2014  . Diverticulitis of colon with perforation 11/22/2014  . Diverticulosis   . GERD   . HIATAL HERNIA   . Hx of echocardiogram    Echo (9/15):  Mild LVH, EF 50-55%, no RWMA, Gr 1 DD, MAC, mild LAE.  [name] HYPERLIPIDEMIA   . HYPERTENSION   . Hyponatremia 11/22/2014  . IBS (irritable bowel syndrome)   . LACTOSE INTOLERANCE   . OA (osteoarthritis)   . OBESITY   . Partial small bowel obstruction ([place])   . PERIPHERAL VASCULAR DISEASE   . Primary hyperparathyroidism ([place])    Lab Results Component Value Date  PTH 150.7* 02/13/2013  CALCIUM 11.0* 02/13/2013  CAION 1.21 03/15/2008    . Prostate cancer ([place])    seed implants 2004  . SICK SINUS/ TACHY-BRADY SYNDROME 09/2007   s/p PPM st judes  . Sleep apnea   . SMALL BOWEL OBSTRUCTION 04/18/2009   Qualifier: History of  By: Asa Lente MD, Jannifer Rodney Symptomatic diverticulosis 01/18/2009   Qualifier: Diagnosis of  By: Shane Crutch, Amy S     Past Surgical History:  Procedure Laterality Date  . BACK SURGERY    . Bilateral cataracts    . COLON RESECTION N/A 11/28/2014   Procedure: EXPLORATORY LAPAROTOMY, SIGMOID COLECTOMY WITH COLOSTOMY;  Surgeon: Jackolyn Confer, MD;  Location: [facility] ORS;  Service: General;  Laterality: N/A;  . COLON SURGERY    . COLONOSCOPY    .  CORONARY ARTERY BYPASS GRAFT    . ESOPHAGOGASTRODUODENOSCOPY  multiple  . FLEXIBLE SIGMOIDOSCOPY N/A 09/22/2013   Procedure: FLEXIBLE SIGMOIDOSCOPY;  Surgeon: Gatha Mayer, MD;  Location: [facility] END[facility]COPY;  Service: Endoscopy;  Laterality: N/A;  . INGUINAL HERNIA REPAIR Bilateral   . LUMBAR [place] SURGERY  12/2008  . PACEMAKER INSERTION     DDD/[employer]         Last interrogation 2/13  on chart     Pacemaker guideline order Dr Tamala Julian on chart  . Partial small bowel obstruction  2009  . PENILE PROSTHESIS PLACEMENT    . PTCA  2008, 2009, 2012   with DES    . TOTAL HIP ARTHROPLASTY  08/21/2011   Procedure: TOTAL HIP ARTHROPLASTY;  Surgeon: Johnn Hai, MD;  Location: [facility];  Service: Orthopedics;  Laterality: Right;    Social History   Social History  . Marital status: Widowed    Spouse name: N/A  . Number of children: N/A  . Years of education: N/A   Occupational History  . retired Retired   Social History Main Topics  . Smoking status: Former Smoker    Packs/day: 1.00    Years: 25.00    Types: Cigarettes    Quit date: 06/08/1994  . Smokeless tobacco: Never Used  . Alcohol use No  . Drug use: No  . 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 Not on file   Social History Narrative  . No narrative on file    Allergies  Allergen Reactions  . Actos [Pioglitazone Hydrochloride] Other (See Comments)    "felt funny, drowsy, and weak":  [name] Buprenorphine Hcl Nausea And Vomiting  . Celebrex [Celecoxib] Other (See Comments)    "felt funny"  . Demerol Palpitations and Other (See Comments)    Increased BP  . Meperidine Palpitations    Other reaction(s): Other (See Comments) Increased BP  . Morphine And Related Nausea And Vomiting  . Ciprofloxacin Other (See Comments)    arthralgia  . Metformin Nausea And Vomiting  . Zocor [Simvastatin] Other (See Comments)    Makes pt very drowsy    Family History  Problem Relation Age of Onset  . Hypertension Mother   . Cancer Mother   . Heart attack    . Heart attack    . Heart attack Brother   . Colon cancer Neg Hx   . Stroke Neg Hx     Prior to Admission medications   Medication Sig Start Date End Date Taking? Authorizing Provider  acetaminophen (TYLENOL) 500 MG tablet Take 1,000 mg by mouth every 6 (six) hours as needed for mild pain or fever.   Yes [provider]  albuterol (PROVENTIL HFA) 108 (90 Base) MCG/ACT inhaler Inhale 2 puffs into the lungs every 4 (four) hours as needed  for shortness of breath. Wheezing 06/09/16  Yes [name], MD  albuterol (PROVENTIL) (2.5 MG/3ML) 0.083% nebulizer solution USE 1 VIAL WITH NEBULIZER EVERY 4 HOURS AS NEEDED FOR WHEEZING 11/25/15  Yes Young, Clinton D, MD  albuterol-ipratropium (COMBIVENT) 18-103 MCG/ACT inhaler Inhale 1 puff into the lungs every 6 (six) hours as needed for wheezing or shortness of breath.   Yes [provider]  aspirin 81 MG tablet Take 81 mg by mouth every evening.    Yes [provider]  dasatinib (SPRYCEL) 70 MG tablet Take 1 tablet (  70 mg total) by mouth daily. 08/10/16  Yes Gorsuch, Ni, MD  fluticasone (FLONASE) 50 MCG/ACT nasal spray Place 2 sprays into both nostrils daily. Allergies 08/01/14  Yes [name], MD  HYDROcodone-acetaminophen (NORCO) 7.5-325 MG tablet Take 1 tablet by mouth every 6 (six) hours as needed for moderate pain.   Yes [provider]  nitroGLYCERIN (NITROSTAT) 0.4 MG SL tablet Place 0.4 mg under the tongue every 5 (five) minutes as needed for chest pain. Reported on 12/12/2015   Yes [provider]  Polyethyl Glycol-Propyl Glycol (SYSTANE) 0.4-0.3 % SOLN Apply 2 drops to eye 3 (three) times daily as needed (dry eyes).    Yes [provider]  pravastatin (PRAVACHOL) 20 MG tablet Take 3 tablets (60 mg total) by mouth daily. 08/01/14  Yes [name], MD  ranitidine (ZANTAC) 300 MG tablet Take 300 mg by mouth at bedtime.    Yes [provider]  triamcinolone cream (KENALOG) 0.1 % Apply 1 application topically 3 (three) times daily as needed (dry skin.).  07/19/15  Yes [provider]  valsartan (DIOVAN) 160 MG tablet Take 1 tablet (160 mg total) by mouth daily. 08/01/14  Yes [name], MD  feeding supplement, ENSURE ENLIVE, (ENSURE ENLIVE) LIQD Take 237 mLs by mouth 2 (two) times daily between meals. 06/23/16   [name], MD  umeclidinium-vilanterol (ANORO ELLIPTA) 62.5-25 MCG/INH AEPB  Inhale 1 puff into the lungs daily. 07/21/16   Melvenia Needles, NP    Physical Exam: Vitals:   10/12/16 1000 10/12/16 1015 10/12/16 1245 10/12/16 1300  BP: (!) 124/99 (!) 118/50 129/67 (!) 151/68  Pulse: 96 83 86 87  Resp: (!) 22 (!) 21 18 (!) 25  SpO2: 91% 94% 94% 93%  Weight:      Height:         General:  Appears somewhat anxious and slightly uncomfortable Eyes:  PERRL, EOMI, normal lids, iris ENT:  grossly normal hearing, lips & tongue, pale somewhat dry Neck:  no LAD, masses or thyromegaly Cardiovascular:  RRR, no m/r/g. No LE edema.  Respiratory:  Increased work of breathing at rest. Breath sounds with distant air movement scattered rhonchi and expiratory wheezing fine crackles bilateral bases using abdominal accessory muscles Abdomen:  soft, ntnd, obese positive bowel sounds no guarding or rebounding Skin:  no rash or induration seen on limited exam Musculoskeletal:  grossly normal tone BUE/BLE, good ROM, no bony abnormality Psychiatric:  grossly normal mood and affect, speech fluent and appropriate, AOx3 Neurologic:  CN 2-12 grossly intact, moves all extremities in coordinated fashion, sensation intact  Labs on Admission: I have personally reviewed following labs and imaging studies  CBC:  Recent Labs Lab 10/12/16 0904  WBC 4.3  HGB 7.6*  HCT 24.0*  MCV 102.1*  PLT 989*   Basic Metabolic Panel:  Recent Labs Lab 10/12/16 0904  NA 140  K 3.5  CL 108  CO2 24  GLUCOSE 138*  BUN 16  CREATININE 1.21  CALCIUM 9.0   GFR: Estimated Creatinine Clearance: 46.6 mL/min (by C-G formula based on SCr of 1.21 mg/dL). Liver Function Tests:  Recent Labs Lab 10/12/16 0904  AST 22  ALT 12*  ALKPHOS 60  BILITOT 0.7  PROT 7.2  ALBUMIN 3.9   No results for input(s): LIPASE, AMYLASE in the last 168 hours. No results for input(s): AMMONIA in the last 168 hours. Coagulation Profile: No results for input(s): INR, PROTIME in the last 168 hours. Cardiac Enzymes: No  results for input(s): CKTOTAL, CKMB, CKMBINDEX, TROPONINI in the last 168 hours. BNP (last 3 results)  Recent Labs  11/27/15 1621  PROBNP 296.0*   HbA1C: No results for input(s): HGBA1C in the last 72 hours. CBG: No results for input(s): GLUCAP in the last 168 hours. Lipid Profile: No results for input(s): CHOL, HDL, LDLCALC, TRIG, CHOLHDL, LDLDIRECT in the last 72 hours. Thyroid Function Tests: No results for input(s): TSH, T4TOTAL, FREET4, T3FREE, THYROIDAB in the last 72 hours. Anemia Panel: No results for input(s): VITAMINB12, FOLATE, FERRITIN, TIBC, IRON, RETICCTPCT in the last 72 hours. Urine analysis:    Component Value Date/Time   COLORURINE YELLOW 12/07/2014 2006   APPEARANCEUR CLEAR 12/07/2014 2006   LABSPEC 1.009 12/07/2014 2006   PHURINE 7.5 12/07/2014 2006   GLUCOSEU NEGATIVE 12/07/2014 2006   GLUCOSEU NEGATIVE 02/09/2014 0953   HGBUR NEGATIVE 12/07/2014 2006   [place] NEGATIVE 12/07/2014 2006   [place] 12/07/2014 2006   PROTEINUR NEGATIVE 12/07/2014 2006   UROBILINOGEN 0.2 12/07/2014 2006   NITRITE NEGATIVE 12/07/2014 2006   LEUKOCYTESUR NEGATIVE 12/07/2014 2006    Creatinine Clearance: Estimated Creatinine Clearance: 46.6 mL/min (by C-G formula based on SCr of 1.21 mg/dL).  Sepsis Labs: _0 (procalcitonin:4,lacticidven:4) )No results found for this or any previous visit (from the past 240 hour(s)).   Radiological Exams on Admission: Dg Chest Port 1 View  Result Date: 10/12/2016 CLINICAL DATA:  Cough and dyspnea EXAM: PORTABLE CHEST 1 VIEW COMPARISON:  07/01/2013 FINDINGS: Progressive bilateral pleural effusions which are now moderate in size. Progressive bibasilar atelectasis. Pulmonary vascular congestion. Mild pulmonary edema has progressed in the interval. CABG with dual lead pacemaker IMPRESSION: Progressive heart failure with edema and enlarging bilateral effusions. Progression of bibasilar atelectasis. Electronically Signed   By:  Franchot Gallo M.D.   On: 10/12/2016 09:07    EKG: Independently reviewed. Sinus rhythm Left anterior fascicular block Consider anterior infarct   Assessment/Plan Principal Problem:   Acute respiratory failure with hypoxia (HCC) Active Problems:   Coronary atherosclerosis   COPD mixed type (HCC)   GERD   Obstructive sleep apnea   Pacemaker   Obesity (BMI 30-39.9)   CML (chronic myeloid leukemia) ([place])   Essential hypertension   Acquired pancytopenia (HCC)   Anemia in neoplastic disease   Bilateral pleural effusion   #1 acute on chronic respiratory failure with hypoxia related to acute on chronic diastolic heart failure in the setting of possible mild COPD exacerbation and worsening pleural effusions. Increased oxygen requirement. Mild tachypnea. Chest x-ray reveals progressive heart failure with edema and worsening bilateral pleural effusions.  -Admit to telemetry -Oxygen supplementation -IV Lasix -Nebulizers -Palliative care for possible comfort care only  #2. Acute on chronic diastolic heart failure. s/p pacemaker 2009. Chest x-ray as noted above. Increased oxygen demand. Echo 4 months ago reveals EF of 46% grade 1 diastolic dysfunction. Home medications include valsartan and HCTZ -Daily weights -Intake and output -Continue home meds  #3. COPD with exacerbation. Some wheezing on exam. -See above -Scheduled nebulizers -Continue home meds  #4. Worsening bilateral pleural effusion. Likely related to above -See above  #5. CML. stable     DVT prophylaxis: scd Code Status: dnr  Family Communication: none present  Disposition Plan: facility  Consults called: palliative care  Admission status: obs    Dyanne Carrel M MD [facility]  If 7PM-7AM, please contact night-coverage [email or web address] Password [email or web address]  10/12/2016, 2:06 PM

## 2016-10-12 NOTE — ED Triage Notes (Signed)
Pt. Here from [facility] facility for shortness of breath starting around 4:30am. Px. Hx of COPD and CHF and on oxygen @ 2L all the time. Pt. Hospice patient for AML. MOST form at bedside stating DNR and restricted fluids/antibiotic treatment. Pt. Aox4. Pt. RR 28 and patient labored breathing. Pt. Given 10mg  albuterol and 0.5mg  atrovent. Pt. Wet sounding cough.

## 2016-10-12 NOTE — ED Notes (Signed)
EDP at bedside  

## 2016-10-12 NOTE — Progress Notes (Signed)
[facility] MSW note: Pt is a current [facility] home care patient. Pt is a DNR. This is related hospice admission. Pt lives alone in an elderly apartment complex. Pt was driving up until about 1.5 weeks ago. Pt is alert and oriented x3. Patient has 4 daughters whom are not very supportive. Pt reported that he had not informed daughters he was in the hospital. MSW offered to call daughter-Jean but patient declined. Pt desires to returned home at hospital discharge. Pt does want to consider [facility] when he medically eligible. Please call with questions or at hospital discharge.   Marilynne Halsted, MSW  [phone]

## 2016-10-13 ENCOUNTER — Observation Stay (HOSPITAL_COMMUNITY)

## 2016-10-13 ENCOUNTER — Ambulatory Visit: Payer: Self-pay | Admitting: *Deleted

## 2016-10-13 ENCOUNTER — Telehealth: Payer: Self-pay | Admitting: Internal Medicine

## 2016-10-13 DIAGNOSIS — D61818 Other pancytopenia: Secondary | ICD-10-CM

## 2016-10-13 DIAGNOSIS — I110 Hypertensive heart disease with heart failure: Secondary | ICD-10-CM | POA: Diagnosis present

## 2016-10-13 DIAGNOSIS — J9620 Acute and chronic respiratory failure, unspecified whether with hypoxia or hypercapnia: Secondary | ICD-10-CM | POA: Diagnosis present

## 2016-10-13 DIAGNOSIS — E210 Primary hyperparathyroidism: Secondary | ICD-10-CM | POA: Diagnosis present

## 2016-10-13 DIAGNOSIS — I5033 Acute on chronic diastolic (congestive) heart failure: Secondary | ICD-10-CM | POA: Diagnosis not present

## 2016-10-13 DIAGNOSIS — Z515 Encounter for palliative care: Secondary | ICD-10-CM | POA: Diagnosis present

## 2016-10-13 DIAGNOSIS — J9601 Acute respiratory failure with hypoxia: Secondary | ICD-10-CM | POA: Diagnosis not present

## 2016-10-13 DIAGNOSIS — I495 Sick sinus syndrome: Secondary | ICD-10-CM | POA: Diagnosis not present

## 2016-10-13 DIAGNOSIS — Z885 Allergy status to narcotic agent status: Secondary | ICD-10-CM | POA: Diagnosis not present

## 2016-10-13 DIAGNOSIS — Z8249 Family history of ischemic heart disease and other diseases of the circulatory system: Secondary | ICD-10-CM | POA: Diagnosis not present

## 2016-10-13 DIAGNOSIS — I255 Ischemic cardiomyopathy: Secondary | ICD-10-CM | POA: Diagnosis present

## 2016-10-13 DIAGNOSIS — J449 Chronic obstructive pulmonary disease, unspecified: Secondary | ICD-10-CM | POA: Diagnosis not present

## 2016-10-13 DIAGNOSIS — I2510 Atherosclerotic heart disease of native coronary artery without angina pectoris: Secondary | ICD-10-CM | POA: Diagnosis present

## 2016-10-13 DIAGNOSIS — J441 Chronic obstructive pulmonary disease with (acute) exacerbation: Secondary | ICD-10-CM | POA: Diagnosis present

## 2016-10-13 DIAGNOSIS — I25810 Atherosclerosis of coronary artery bypass graft(s) without angina pectoris: Secondary | ICD-10-CM | POA: Diagnosis not present

## 2016-10-13 DIAGNOSIS — E669 Obesity, unspecified: Secondary | ICD-10-CM | POA: Diagnosis present

## 2016-10-13 DIAGNOSIS — Z888 Allergy status to other drugs, medicaments and biological substances status: Secondary | ICD-10-CM | POA: Diagnosis not present

## 2016-10-13 DIAGNOSIS — I739 Peripheral vascular disease, unspecified: Secondary | ICD-10-CM | POA: Diagnosis not present

## 2016-10-13 DIAGNOSIS — Z950 Presence of cardiac pacemaker: Secondary | ICD-10-CM | POA: Diagnosis not present

## 2016-10-13 DIAGNOSIS — Z66 Do not resuscitate: Secondary | ICD-10-CM | POA: Diagnosis present

## 2016-10-13 DIAGNOSIS — E785 Hyperlipidemia, unspecified: Secondary | ICD-10-CM | POA: Diagnosis present

## 2016-10-13 DIAGNOSIS — E1151 Type 2 diabetes mellitus with diabetic peripheral angiopathy without gangrene: Secondary | ICD-10-CM | POA: Diagnosis present

## 2016-10-13 DIAGNOSIS — D630 Anemia in neoplastic disease: Secondary | ICD-10-CM

## 2016-10-13 DIAGNOSIS — K219 Gastro-esophageal reflux disease without esophagitis: Secondary | ICD-10-CM | POA: Diagnosis present

## 2016-10-13 DIAGNOSIS — N183 Chronic kidney disease, stage 3 (moderate): Secondary | ICD-10-CM | POA: Diagnosis not present

## 2016-10-13 DIAGNOSIS — Z6832 Body mass index (BMI) 32.0-32.9, adult: Secondary | ICD-10-CM | POA: Diagnosis not present

## 2016-10-13 DIAGNOSIS — G4733 Obstructive sleep apnea (adult) (pediatric): Secondary | ICD-10-CM | POA: Diagnosis present

## 2016-10-13 DIAGNOSIS — I5030 Unspecified diastolic (congestive) heart failure: Secondary | ICD-10-CM | POA: Diagnosis not present

## 2016-10-13 DIAGNOSIS — R0602 Shortness of breath: Secondary | ICD-10-CM | POA: Diagnosis not present

## 2016-10-13 DIAGNOSIS — R05 Cough: Secondary | ICD-10-CM | POA: Diagnosis not present

## 2016-10-13 DIAGNOSIS — Z951 Presence of aortocoronary bypass graft: Secondary | ICD-10-CM | POA: Diagnosis not present

## 2016-10-13 DIAGNOSIS — J9621 Acute and chronic respiratory failure with hypoxia: Secondary | ICD-10-CM | POA: Diagnosis present

## 2016-10-13 DIAGNOSIS — E739 Lactose intolerance, unspecified: Secondary | ICD-10-CM | POA: Diagnosis present

## 2016-10-13 DIAGNOSIS — C9210 Chronic myeloid leukemia, BCR/ABL-positive, not having achieved remission: Secondary | ICD-10-CM | POA: Diagnosis present

## 2016-10-13 LAB — BASIC METABOLIC PANEL
Anion gap: 10 (ref 5–15)
BUN: 21 mg/dL — AB (ref 6–20)
CHLORIDE: 102 mmol/L (ref 101–111)
CO2: 25 mmol/L (ref 22–32)
Calcium: 8.8 mg/dL — ABNORMAL LOW (ref 8.9–10.3)
Creatinine, Ser: 1.23 mg/dL (ref 0.61–1.24)
GFR calc Af Amer: 59 mL/min — ABNORMAL LOW (ref >60)
GFR calc non Af Amer: 51 mL/min — ABNORMAL LOW (ref >60)
GLUCOSE: 125 mg/dL — AB (ref 65–99)
POTASSIUM: 3.9 mmol/L (ref 3.5–5.1)
Sodium: 137 mmol/L (ref 135–145)

## 2016-10-13 LAB — PREPARE RBC (CROSSMATCH)

## 2016-10-13 MED ORDER — SODIUM CHLORIDE 0.9 % IV SOLN
Freq: Once | INTRAVENOUS | Status: AC
  Administered 2016-10-13: 19:00:00 via INTRAVENOUS

## 2016-10-13 MED ORDER — ORAL CARE MOUTH RINSE
15.0000 mL | Freq: Two times a day (BID) | OROMUCOSAL | Status: DC
  Administered 2016-10-13 – 2016-10-15 (×5): 15 mL via OROMUCOSAL

## 2016-10-13 NOTE — Telephone Encounter (Signed)
Just FYI.

## 2016-10-13 NOTE — Care Management Obs Status (Signed)
[facility] NOTIFICATION   Patient Details  Name: Eric [name] MRN: [id number] Date of Birth: 1927/05/15   Medicare Observation Status Notification Given:  Yes    Carles Collet, RN 10/13/2016, 9:56 AM

## 2016-10-13 NOTE — Progress Notes (Signed)
[place] - GIP RN visit at 0130pm.   This is a related, covered GIP admission from 10/12/16 to [facility] diagnosis of COPD, per Dr. Cherie Ouch.  Patient code status is DNR.  Patient was admitted for CHF and symptomatice anemia.  Patient did call hospice prior to coming to the hospital, but "didn't want to wait for hospice RN to come out".    Visited with patient at bedside today.  Patient was alert and oriented.  Patient in NAD on 3L O2 [place].  Patient states "I do get short of breath right much.  I can't walk from here to over there (about 2 ft) without getting winded".  Patient was inquiring as to when could he go to residential hospice, as he states "I'm ready to die.  You are born dying and now I'm closer.  I have one daughter that doesn't believe that".  We discussed hospice services and he was upset "that noone comes and does light housekeeping for me, because I can't do that".  I asked if any family / friend support and he states "naw".   Patient receiving:  Albuterol (PROVENTIL) (2.5 MG/3ML) 0.083% nebulizer solution 2.5 mg, Dose 2.5 mg, 4 x daily via nebulization; enoxaparin (LOVENOX) injection 30 mg, Dose 30 mg, Q24H via [place]; furosemide (LASIX) injection 80 mg, Dose 80 mg, BID via IV.  Continuous medication:  0.9% sodium chloride infusion, once via IV.  Patient has received the following PRN medications today:  guaFENesin-dextromethorphan (ROBITUSSIN DM)100-10MG /5ML syrup 5 mL, Dose 5 mL, Q4H PRN via PO; HYDROcodone-acetaminophen (NORCO) 7.5-325 MG per tablet 1 tablet, Dose 1 tablet, Q6H PRN via PO; ipratropium-albuterol (DUONEB) 0.5-2.5 (3) MG/3ML nebulizer solution 3 mL, Dose 3 mL, Q6H PRN via nebulization.  Transfer summary and medication list to be placed on patient's chart.  Dr. Karie Georges and Dr. Lesly Rubenstein made aware of patient admission and condition.  Will continue to monitor patient while in the hospital and anticipate any discharge needs.  Thank  you,  Edyth Gunnels, RN, BSN [facility] Liaison [phone]  All hospital liaisons are now on [place].

## 2016-10-13 NOTE — Progress Notes (Signed)
Patient has refused CPAP HS. Patient states he is not feeling well and he doesn't want to wear it. Its too uncomortable

## 2016-10-13 NOTE — Progress Notes (Signed)
[place] - Chaplain Note:  Patient is a current [facility] home care patient. Patient was dozing, and reported feeling sleepy and dizzy from medication. Patient is very hard of hearing. Patient stated he did not sleep well last night due to cough. Patient stated that his daughter, [name], will be visiting him this afternoon in the hospital. Patient expressed confusion at seeing so many [facility] staff and stated he was hearing "conflicting" facts. Patient particularly disappointed that [facility] could not provide housekeeping services in his home. Patient did say he has a friend/neighbor at his apartment complex who cleans and does laundry for him. Patient said he is Eric [name], "I believe in God and [place]." Patient has no church affiliation, but watches Sunday morning preachers on television. Patient stated that he "hopes this doesn't have much suffering with it," and acknowledged how difficult it is at times to breath. Patient stated he "knows he is dying" and expressed frustration that "you must be almost dead" before going to [place], [employer]. Patient has no concerns about returning to his home after discharge. Patient repeatedly stated that he was "so sleepy." Chaplain provided spiritual presence with affirmation of faith and prayer. Chaplain provided brief introduction to [facility] spiritual care services.   Please call [facility] with questions or concerns.   [place], [place], [place]

## 2016-10-13 NOTE — Progress Notes (Signed)
PROGRESS NOTE    AARIK [name]  XBJ:[id number] DOB: 11-Jun-1926 DOA: 10/12/2016 PCP: [name], MD  Brief Narrative:Eric [name] is a 83 y.o. male with medical history significant chronic respiratory failure on 2-3 L O2, DiastolicCHF and COPD, CML, hypertension presents to the emergency Department chief complaint sudden worsening shortness of breath primarily related to CHF now  Assessment & Plan:   #1 Acute on chronic respiratory failure with hypoxia related to acute on chronic diastolic heart failure in the setting of worsening anemia and CML on Rx, and has underlying COPD -continue IV lasix -transfuse 1 unit PRBC -with ongoing failure to thrive/severe Cardio pulm issues, referred to Hospice recently by PCP, Dr.Merrell requested Palliative consult for Goals of care/Comfort care discussion - I have stopped his Chemo/Biologic med pending this  #2. Acute on chronic diastolic heart failure. s/p pacemaker 2009. Chest x-ray as noted above. Increased oxygen demand. Echo 4 months ago reveals EF of 30% grade 1 diastolic dysfunction -as above  #3. COPD  -stable, no wheezes, nebs PRN  #4. Anemia -acute on chronic -due to CML and biologic agent for Rx -transfuse 1 unit PRBC -held Sprycel  #5. CML. Stable -followed by Dr.Gorsuch  -was on Sprycel ( Biologic-TK inhibitor) per Dr.Gorsuch, recently stopped  DVT prophylaxis: scd Code Status: dnr  Family Communication: none present  Disposition Plan: facility   Consultants:   Palliative medicine   Subjective: Still short of breath  Objective: Vitals:   10/12/16 1530 10/12/16 2149 10/13/16 0531 10/13/16 0858  BP:  (!) 142/75 (!) 136/48   Pulse:  82 73   Resp:  20 (!) 22   Temp:  98.1 F (36.7 C) 97.5 F (36.4 C)   TempSrc:  Oral Oral   SpO2: 95% 98% 100% 97%  Weight:   95.3 kg (210 lb 3.2 oz)   Height:        Intake/Output Summary (Last 24 hours) at 10/13/16 1127 Last data filed at 10/13/16 1124  Gross per 24 hour  Intake             1660 ml  Output             1725 ml  Net              -65 ml   Filed Weights   10/12/16 0817 10/13/16 0531  Weight: 95.7 kg (211 lb) 95.3 kg (210 lb 3.2 oz)    Examination:  General exam: elderly frail male, obese, chronically ill appearing Respiratory system: crackles and Decraesed BS at bases Cardiovascular system: S1 & S2 heard, RRR. No JVD, murmurs Gastrointestinal system: Abdomen is nondistended, soft and nontender.Normal bowel sounds heard. Central nervous system: Alert and oriented. No focal neurological deficits. Extremities: Symmetric 5 x 5 power, 1 plus edema Skin: No rashes, lesions or ulcers Psychiatry: Judgement and insight appear normal. Mood & affect appropriate.     Data Reviewed:   CBC:  Recent Labs Lab 10/12/16 0904  WBC 4.3  HGB 7.6*  HCT 24.0*  MCV 102.1*  PLT 865*   Basic Metabolic Panel:  Recent Labs Lab 10/12/16 0904 10/13/16 0553  NA 140 137  K 3.5 3.9  CL 108 102  CO2 24 25  GLUCOSE 138* 125*  BUN 16 21*  CREATININE 1.21 1.23  CALCIUM 9.0 8.8*   GFR: Estimated Creatinine Clearance: 45.7 mL/min (by C-G formula based on SCr of 1.23 mg/dL). Liver Function Tests:  Recent Labs Lab 10/12/16 0904  AST 22  ALT  12*  ALKPHOS 60  BILITOT 0.7  PROT 7.2  ALBUMIN 3.9   No results for input(s): LIPASE, AMYLASE in the last 168 hours. No results for input(s): AMMONIA in the last 168 hours. Coagulation Profile: No results for input(s): INR, PROTIME in the last 168 hours. Cardiac Enzymes: No results for input(s): CKTOTAL, CKMB, CKMBINDEX, TROPONINI in the last 168 hours. BNP (last 3 results)  Recent Labs  11/27/15 1621  PROBNP 296.0*   HbA1C: No results for input(s): HGBA1C in the last 72 hours. CBG: No results for input(s): GLUCAP in the last 168 hours. Lipid Profile: No results for input(s): CHOL, HDL, LDLCALC, TRIG, CHOLHDL, LDLDIRECT in the last 72 hours. Thyroid Function Tests: No  results for input(s): TSH, T4TOTAL, FREET4, T3FREE, THYROIDAB in the last 72 hours. Anemia Panel: No results for input(s): VITAMINB12, FOLATE, FERRITIN, TIBC, IRON, RETICCTPCT in the last 72 hours. Urine analysis:    Component Value Date/Time   COLORURINE YELLOW 12/07/2014 2006   APPEARANCEUR CLEAR 12/07/2014 2006   LABSPEC 1.009 12/07/2014 2006   PHURINE 7.5 12/07/2014 2006   GLUCOSEU NEGATIVE 12/07/2014 2006   GLUCOSEU NEGATIVE 02/09/2014 0953   HGBUR NEGATIVE 12/07/2014 2006   BILIRUBINUR NEGATIVE 12/07/2014 2006   [place] 12/07/2014 2006   PROTEINUR NEGATIVE 12/07/2014 2006   UROBILINOGEN 0.2 12/07/2014 2006   NITRITE NEGATIVE 12/07/2014 2006   LEUKOCYTESUR NEGATIVE 12/07/2014 2006   Sepsis Labs: @LABRCNTIP (procalcitonin:4,lacticidven:4)  )No results found for this or any previous visit (from the past 240 hour(s)).       Radiology Studies: Dg Chest Port 1 View  Result Date: 10/13/2016 CLINICAL DATA:  Shortness of breath and cough. EXAM: PORTABLE CHEST 1 VIEW COMPARISON:  10/12/2016 FINDINGS: There continues to be bilateral pleural effusions, left side greater than right. These pleural effusions are moderate in size. Prominent lung markings bilaterally suggestive for vascular congestion or mild edema. The cardiac silhouette is obscured by the chest densities but cannot exclude cardiomegaly. There is a left dual chamber cardiac pacemaker with post CABG changes. Atherosclerotic calcifications at the aortic arch. Negative for a pneumothorax. IMPRESSION: Bilateral pleural effusions with vascular congestion or mild pulmonary edema. Minimal change from the previous examination. Electronically Signed   By: Markus Daft M.D.   On: 10/13/2016 07:42   Dg Chest Port 1 View  Result Date: 10/12/2016 CLINICAL DATA:  Cough and dyspnea EXAM: PORTABLE CHEST 1 VIEW COMPARISON:  07/01/2013 FINDINGS: Progressive bilateral pleural effusions which are now moderate in size. Progressive  bibasilar atelectasis. Pulmonary vascular congestion. Mild pulmonary edema has progressed in the interval. CABG with dual lead pacemaker IMPRESSION: Progressive heart failure with edema and enlarging bilateral effusions. Progression of bibasilar atelectasis. Electronically Signed   By: Franchot Gallo M.D.   On: 10/12/2016 09:07        Scheduled Meds: . albuterol  2.5 mg Nebulization QID  . aspirin EC  81 mg Oral QPM  . dasatinib  70 mg Oral Daily  . enoxaparin (LOVENOX) injection  30 mg Subcutaneous Q24H  . famotidine  10 mg Oral Daily  . feeding supplement (ENSURE ENLIVE)  237 mL Oral BID BM  . furosemide  80 mg Intravenous BID  . irbesartan  150 mg Oral Daily  . mouth rinse  15 mL Mouth Rinse BID  . pravastatin  60 mg Oral Daily  . sodium chloride flush  3 mL Intravenous Q12H  . umeclidinium-vilanterol  1 puff Inhalation Daily   Continuous Infusions: . sodium chloride  LOS: 0 days    Time spent: 61min    Domenic Polite, MD [facility] Pager [phone]  If 7PM-7AM, please contact night-coverage [email or web address] Password [facility] 10/13/2016, 11:27 AM

## 2016-10-13 NOTE — Consult Note (Signed)
   [facility] Inpatient Consult   10/13/2016  Eric [name] [birth year] [id number]   Patient was previously active with [facility] Care Management. Chart review and reveals the patient is Eric [name] a 83 y.o.malewith medical history significant chronic respiratory failure on 2-3 L O2, Diastolic HF and COPD, CML, hypertension presents to the emergency Department chief complaint sudden worsening shortness of breath primarily related to HF now. Patient was referred to Palliative care prior to admission.  Met with the patient regarding post hospital follow up.  He was in agreement that he will just have follow up with Palliative Care. Will close his case from active as the patient will be active and receive care management with Palliative Care.  For questions, please contact:  Natividad Brood, RN BSN [place] [facility] Liaison  [phone] business mobile phone Toll free office [phone]

## 2016-10-13 NOTE — Progress Notes (Signed)
RN was called into room because patient was having difficulty breathing. Patient was assessed and found to be in distressed. RN listened to lung sounds- diminished and rhonchi. A breathing txt was administered. Patient stated that he started to feel better when the txt start and his continuous pulse ox reading was 98-100%. When txt was completed patient sounded less diminished and was no longer in distressed. Will continue to monitor.

## 2016-10-13 NOTE — Telephone Encounter (Signed)
Patient was admitted to [facility] on 10/12/16.

## 2016-10-13 NOTE — Progress Notes (Signed)
Pt get SOB with cough, per pt feels phlegm is stack in his throat and wants to cough it out, gave him PRN breathing treatment  And robitussin twice , O2 level above 92, on call physician paged about it, vital signs readings and lungs sound paged to [place], will continue to monitor pt

## 2016-10-14 ENCOUNTER — Encounter: Payer: Self-pay | Admitting: *Deleted

## 2016-10-14 ENCOUNTER — Other Ambulatory Visit: Payer: Self-pay | Admitting: *Deleted

## 2016-10-14 LAB — TYPE AND SCREEN
ABO/RH(D): A POS
Antibody Screen: NEGATIVE
UNIT DIVISION: 0

## 2016-10-14 LAB — CBC
HEMATOCRIT: 26.5 % — AB (ref 39.0–52.0)
HEMOGLOBIN: 8.1 g/dL — AB (ref 13.0–17.0)
MCH: 30.3 pg (ref 26.0–34.0)
MCHC: 30.6 g/dL (ref 30.0–36.0)
MCV: 99.3 fL (ref 78.0–100.0)
Platelets: 164 10*3/uL (ref 150–400)
RBC: 2.67 MIL/uL — AB (ref 4.22–5.81)
RDW: 17 % — ABNORMAL HIGH (ref 11.5–15.5)
WBC: 3.8 10*3/uL — ABNORMAL LOW (ref 4.0–10.5)

## 2016-10-14 LAB — BASIC METABOLIC PANEL
Anion gap: 9 (ref 5–15)
BUN: 22 mg/dL — ABNORMAL HIGH (ref 6–20)
CALCIUM: 8.7 mg/dL — AB (ref 8.9–10.3)
CO2: 26 mmol/L (ref 22–32)
Chloride: 103 mmol/L (ref 101–111)
Creatinine, Ser: 1.28 mg/dL — ABNORMAL HIGH (ref 0.61–1.24)
GFR, EST AFRICAN AMERICAN: 56 mL/min — AB (ref >60)
GFR, EST NON AFRICAN AMERICAN: 49 mL/min — AB (ref >60)
GLUCOSE: 101 mg/dL — AB (ref 65–99)
POTASSIUM: 3.8 mmol/L (ref 3.5–5.1)
SODIUM: 138 mmol/L (ref 135–145)

## 2016-10-14 LAB — BPAM RBC
BLOOD PRODUCT EXPIRATION DATE: 201805182359
ISSUE DATE / TIME: 201805081845
Unit Type and Rh: 6200

## 2016-10-14 NOTE — Progress Notes (Signed)
[place] - GIP RN visit at 1010am.   This is a related, covered GIP admission from 10/12/16 to [facility] diagnosis of COPD, per Dr. Cherie Ouch.  Patient code status is DNR.  Patient was admitted for CHF and symptomatice anemia.  Patient did call hospice prior to coming to the hospital, but "didn't want to wait for hospice RN to come out".    Visited with patient at bedside today.  Patient was alert and oriented.  NAD noted.  Patient did get SOB when he tried to lay down in bed and set the head of the bed up.  Otherwise, he states "I feel some better".  Patient concerned that he will not be able to stay in the hospital "because they throw you out before you are better".  Offered emotional support to patient.    Patient receiving:  albuterol (PROVENTIL) (2.5 MG/3ML) 0.083% nebulizer solution 2.5 mg, Dose 2.5 mg, 4 x daily via nebulization; enoxaparin (LOVENOX) injection 30 mg, Dose 30 mg, Q24H via [place]; furosemide (LASIX) injection 80 mg, Dose 80 mg, BID via IV; umeclidinium-vilanterol (ANORO ELLIPTA) 62.5-25 MCG/INH 1 puff, Dose 1 puff, QD via IN.  No continuous medications at this time.  Patient did receive PRN dose of ipratropium-albuterol (DUONEB) 0.5-2.5 (3) MG/ML nebulizer solution 3 mL, Dose 3 mL, Q6H PRN via Nebulization.   Will continue to monitor patient while in the hospital and anticipate any discharge needs.  Thank you,  Edyth Gunnels, RN, BSN [facility] Liaison [phone]  All hospital liaisons are now on [place].

## 2016-10-14 NOTE — Patient Outreach (Signed)
Case closure. Pt is now active with Hospice and [facility] is transitioning off.  [name] [facility] [facility] [phone]

## 2016-10-14 NOTE — Progress Notes (Signed)
[facility] Progress Note  Patient: Eric [name] RCB:[id number]   PCP: [name], MD DOB: 04-07-1927   DOA: 10/12/2016   DOS: 10/14/2016   Date of Service: the patient was seen and examined on 10/14/2016  Subjective: Feeling better but continues to have significant shortness of breath as well as hypoxia on ambulating with minimal steps. No chest pain.  Brief hospital course: Pt. with PMH of CHF COPD, chronic respiratory failure on hospice; admitted on 10/12/2016, presented with complaint of shortness of breath, was found to have acute on chronic CHF with acute on chronic hypoxia. Currently further plan is continue IV diuresis.  Assessment and Plan: 1. Acute on chronic diastolic CHF. Acute on chronic hypoxic respiratory failure. Severe COPD. Patient -2.5 L so far. No chest pain. Oxygenation still significantly low on ambulation. Bilateral crackles present on examination. Continue IV Lasix 80 mg twice a day. Continue Flonase, continue nebulizers.  2. Symptomatic anemia. Next and patient was given 1units of PRBC transfusion . No active bleeding reported. H&H remained stable after transfusion. We'll continue to monitor.  3. Goals of care, hospice patient. Patient is recently established with hospice, currently his only and primary concern is that he does not have services available for helping him with daily bathing and making his bedding at home. He understands his prognosis, also understands he is not a candidate for residential hospice. We will get physical therapy evaluation. I will discuss with hospice of [facility] tomorrow to see if the patient can get aforementioned services at home for transition.  4. CML. Patient is on maintenance treatment for the same. His treatment has been on hold since admission. I'll continue to monitor.  5. Essential hypertension. Currently on irbesartan. We'll monitor.  Diet: Cardiac diet DVT Prophylaxis: subcutaneous  Heparin  Advance goals of care discussion: DNR/DNI  Family Communication: no family was present at bedside, at the time of interview. The pt did not provide permission to discuss medical plan with the family.   Disposition:  Discharge to home with hospice with services for aid versus SNF.  Consultants: none Procedures: none  Antibiotics: Anti-infectives    None       Objective: Physical Exam: Vitals:   10/14/16 0743 10/14/16 1226 10/14/16 1415 10/14/16 1542  BP:   (!) 149/49   Pulse:   89   Resp:   20   Temp:   98.9 F (37.2 C)   TempSrc:   Oral   SpO2: 98% 97% 93% 98%  Weight:      Height:        Intake/Output Summary (Last 24 hours) at 10/14/16 1708 Last data filed at 10/14/16 1231  Gross per 24 hour  Intake              365 ml  Output             2225 ml  Net            -1860 ml   Filed Weights   10/12/16 0817 10/13/16 0531 10/14/16 0529  Weight: 95.7 kg (211 lb) 95.3 kg (210 lb 3.2 oz) 94.4 kg (208 lb 3.2 oz)   General: Alert, Awake and Oriented to Time, Place and Person. Appear in moderate distress, affect appropriate Eyes: PERRL, Conjunctiva normal ENT: Oral Mucosa clear moist. Neck: difficult to assess JVD, no Abnormal Mass Or lumps Cardiovascular: S1 and S2 Present, aortic systolic Murmur, Respiratory: Bilateral Air entry equal and Decreased, positive use of accessory muscle, bilateral Crackles, no wheezes  Abdomen: Bowel Sound present, Soft and no tenderness Skin: no redness, no Rash, no induration Extremities: bilateral Pedal edema, no calf tenderness Neurologic: Grossly no focal neuro deficit. Bilaterally Equal motor strength  Data Reviewed: CBC:  Recent Labs Lab 10/12/16 0904 10/14/16 0733  WBC 4.3 3.8*  HGB 7.6* 8.1*  HCT 24.0* 26.5*  MCV 102.1* 99.3  PLT 149* 683   Basic Metabolic Panel:  Recent Labs Lab 10/12/16 0904 10/13/16 0553 10/14/16 0733  NA 140 137 138  K 3.5 3.9 3.8  CL 108 102 103  CO2 24 25 26   GLUCOSE 138* 125*  101*  BUN 16 21* 22*  CREATININE 1.21 1.23 1.28*  CALCIUM 9.0 8.8* 8.7*    Liver Function Tests:  Recent Labs Lab 10/12/16 0904  AST 22  ALT 12*  ALKPHOS 60  BILITOT 0.7  PROT 7.2  ALBUMIN 3.9   No results for input(s): LIPASE, AMYLASE in the last 168 hours. No results for input(s): AMMONIA in the last 168 hours. Coagulation Profile: No results for input(s): INR, PROTIME in the last 168 hours. Cardiac Enzymes: No results for input(s): CKTOTAL, CKMB, CKMBINDEX, TROPONINI in the last 168 hours. BNP (last 3 results)  Recent Labs  11/27/15 1621  PROBNP 296.0*   CBG: No results for input(s): GLUCAP in the last 168 hours. Studies: No results found.  Scheduled Meds: . albuterol  2.5 mg Nebulization QID  . aspirin EC  81 mg Oral QPM  . enoxaparin (LOVENOX) injection  30 mg Subcutaneous Q24H  . famotidine  10 mg Oral Daily  . feeding supplement (ENSURE ENLIVE)  237 mL Oral BID BM  . furosemide  80 mg Intravenous BID  . irbesartan  150 mg Oral Daily  . mouth rinse  15 mL Mouth Rinse BID  . pravastatin  60 mg Oral Daily  . umeclidinium-vilanterol  1 puff Inhalation Daily   Continuous Infusions: PRN Meds: acetaminophen, guaiFENesin-dextromethorphan, HYDROcodone-acetaminophen, ipratropium-albuterol, ondansetron (ZOFRAN) IV  Time spent: 35 minutes  Author: Berle Mull, MD [facility] Pager: [phone] 10/14/2016 5:08 PM  If 7PM-7AM, please contact night-coverage at [email or web address], password [facility]

## 2016-10-15 LAB — CBC
HEMATOCRIT: 25.9 % — AB (ref 39.0–52.0)
HEMOGLOBIN: 8.2 g/dL — AB (ref 13.0–17.0)
MCH: 30.9 pg (ref 26.0–34.0)
MCHC: 31.7 g/dL (ref 30.0–36.0)
MCV: 97.7 fL (ref 78.0–100.0)
Platelets: 151 10*3/uL (ref 150–400)
RBC: 2.65 MIL/uL — AB (ref 4.22–5.81)
RDW: 16.5 % — ABNORMAL HIGH (ref 11.5–15.5)
WBC: 3 10*3/uL — ABNORMAL LOW (ref 4.0–10.5)

## 2016-10-15 LAB — BASIC METABOLIC PANEL
ANION GAP: 10 (ref 5–15)
BUN: 21 mg/dL — ABNORMAL HIGH (ref 6–20)
CHLORIDE: 100 mmol/L — AB (ref 101–111)
CO2: 28 mmol/L (ref 22–32)
Calcium: 8.8 mg/dL — ABNORMAL LOW (ref 8.9–10.3)
Creatinine, Ser: 1.21 mg/dL (ref 0.61–1.24)
GFR calc non Af Amer: 52 mL/min — ABNORMAL LOW (ref >60)
GLUCOSE: 109 mg/dL — AB (ref 65–99)
Potassium: 3.2 mmol/L — ABNORMAL LOW (ref 3.5–5.1)
Sodium: 138 mmol/L (ref 135–145)

## 2016-10-15 MED ORDER — ALBUTEROL SULFATE (2.5 MG/3ML) 0.083% IN NEBU: 2.5000 mg | INHALATION_SOLUTION | Freq: Four times a day (QID) | RESPIRATORY_TRACT | Status: DC

## 2016-10-15 MED ORDER — FUROSEMIDE 40 MG PO TABS: 80.0000 mg | ORAL_TABLET | Freq: Two times a day (BID) | ORAL | 0 refills | Status: DC

## 2016-10-15 MED ORDER — FUROSEMIDE 40 MG PO TABS
40.0000 mg | ORAL_TABLET | Freq: Two times a day (BID) | ORAL | Status: DC
  Administered 2016-10-15: 40 mg via ORAL
  Filled 2016-10-15: qty 1

## 2016-10-15 MED ORDER — ALBUTEROL SULFATE (2.5 MG/3ML) 0.083% IN NEBU: 2.5000 mg | INHALATION_SOLUTION | Freq: Four times a day (QID) | RESPIRATORY_TRACT | 0 refills | Status: DC

## 2016-10-15 MED ORDER — POTASSIUM CHLORIDE CRYS ER 20 MEQ PO TBCR
40.0000 meq | EXTENDED_RELEASE_TABLET | Freq: Once | ORAL | Status: AC
  Administered 2016-10-15: 40 meq via ORAL
  Filled 2016-10-15: qty 2

## 2016-10-15 MED ORDER — GUAIFENESIN-DM 100-10 MG/5ML PO SYRP: 5.0000 mL | ORAL_SOLUTION | ORAL | 0 refills | Status: AC | PRN

## 2016-10-15 MED ORDER — POTASSIUM CHLORIDE ER 10 MEQ PO TBCR: 20.0000 meq | EXTENDED_RELEASE_TABLET | Freq: Every day | ORAL | 0 refills | Status: DC

## 2016-10-15 NOTE — Progress Notes (Signed)
SATURATION QUALIFICATIONS: (This note is used to comply with regulatory documentation for home oxygen)   Patient Saturations on 6 Liters of oxygen while Ambulating 30' = 88%-89%  Please briefly explain why patient needs home oxygen: Pt ambulated 30' on 6 LNP, tolerated fairly well with O2 saturations dipping down to 88%. Pt was winded after walk, however saturations quickly went back to 93%-94% as patient rested. Patient's O2 was then reduced to 3 LNP.

## 2016-10-15 NOTE — Care Management Note (Addendum)
Case Management Note  Patient Details  Name: Eric [name] MRN: [id number] Date of Birth: 1927/04/30  Subjective/Objective:              Pt. with PMH of CHF COPD, chronic respiratory failure on hospice; admitted on 10/12/2016, presented with complaint of shortness of breath, was found to have acute on chronic CHF with acute on chronic hypoxia. Pt is active with  [facility] for hospice care.  Eric [name] (Daughter) Eric [name] (Daughter)    [phone]      Action/Plan: Plan is to d/c to home today via [facility]. Please call [phone] for transportation to home. If after 5pm call [phone]. CM has asked MD to complete DNR, gold form to accompany pt to home.  Expected Discharge Date:  10/15/16               Expected Discharge Plan:  Home w Hospice Care  In-House Referral:     Discharge planning Services  CM Consult  Post Acute Care Choice:    Choice offered to:     DME Arranged:   DME:3 LPM at rest 5-6 LPM at exertion. [place] [facility] Liaison [phone],  Stated order received per [facility].  DME Agency:   [place], per [facility]  HH Arranged:    [place] Agency:  [facility]osp[facility] and [place],  Status of Service:  Completed, signed off  If discussed at [place] of Stay Meetings, dates discussed:    Additional Comments:  Sharin Mons, RN 10/15/2016, 4:39 PM

## 2016-10-15 NOTE — Progress Notes (Signed)
[place] - GIP RN visit at 1000am.   This is a related, covered GIP admission from 10/12/16 to [facility] diagnosis of COPD, per Dr. Cherie Ouch. Patient code status is DNR. Patient was admitted for CHF and symptomatic anemia. Patient did call hospice prior to coming to the hospital, but "didn't want to wait for hospice RN to come out".   Visited with patient at bedside today. Patient was alert and oriented. Denies any pain. He is a bit agitated about being "kicked out of hospital, and not being well" and absolutely refuses to go to SNF or short term rehab. Discussed with him his disease process/ progression of disease and that he may not get back to what he defines as well. We explored the need for increasing assistance and help at home and I informed him that he would not be able to have hospice aides in the home 5-7 days a week. We could start with care 2-3 days per week and that aides would come in, complete services and leave. Reiterated with him that hospice does not provide constant care in the home, our services are visits from a team for managing symptoms and comfort focused care. Explored the idea of hiring daily caregivers for a defined period of time each day to assist with needs at home. Pt was agreeable and we discussed about his daughter Hassan Rowan helping arrange this for him. Emotional support provided.  O2 sats at rest in chair upon arrival were 98%, though after talking O2 sats decreased to 87%. Pt is on [place] O2 at 3 lpm continuous. No family is present at bedside.  Discussed with Marilynne Halsted, LCSW with [facility] who was going to reach out to [place] to ask her assistance in the above arrangements.   Discussed plan of care with Dr. Posey Pronto.   [facility] will continue to follow patient daily until discharge.  Thank you, [place] [facility] Liaison [phone]  [place] are on [place].

## 2016-10-15 NOTE — Evaluation (Addendum)
Physical Therapy Evaluation Patient Details Name: Eric [name] MRN: [id number] DOB: 10-10-1926 Today's Date: 10/15/2016   History of Present Illness  83 y.o. male with PMH of CHF COPD, chronic respiratory failure on hospice; admitted on 10/12/2016, presented with complaint of shortness of breath, was found to have acute on chronic CHF with acute on chronic hypoxia  Clinical Impression  Pt is independent with bed to recliner transfers, he is not able to tolerate ambulation 2* 3/4 dyspnea and hypoxia (85% on 2.5L O2) with minimal activity. He would benefit from a motorized scooter and 3 in 1 for home. He plans to hire a home health aide to assist with bathing. He adamantly refused SNF and any further PT. PT signing off.     Follow Up Recommendations No PT follow up (pt refused f/u PT, adamantly refuses SNF)    Equipment Recommendations  3in1 (PT);Other (comment) (motorized scooter due to dyspnea with minimal activity) -per hospice note, pt's daughter is working on getting a Pension scheme manager for Other Services       Precautions / Restrictions Precautions Precautions: Fall Precaution Comments: monitor O2 Restrictions Weight Bearing Restrictions: No      Mobility  Bed Mobility               General bed mobility comments: up in recliner, pt reported he's independent with supine to sit  Transfers Overall transfer level: Modified independent Equipment used: None             General transfer comment: stand pivot from recliner to bed then back to recliner, used armrests of recliner, desaturated to 85% on 2.5L O2 and 3/4 dyspnea with transfers, therefore ambulation deferred  Ambulation/Gait             General Gait Details: deferred 2* 3/4 dyspnea and desaturation to 85% on 2.5 L with stand pivot transfer x 2  Stairs            Wheelchair Mobility    Modified Rankin (Stroke Patients Only)       Balance Overall balance assessment: No  apparent balance deficits (not formally assessed)                                           Pertinent Vitals/Pain Pain Assessment: No/denies pain    Home Living Family/patient expects to be discharged to:: Private residence Patent attorney) Living Arrangements: Alone Available Help at Discharge: Personal care attendant Type of Home: Independent living facility Home Access: Level entry     Home Layout: One level Home Equipment: Shower seat - built in;Walker - 4 wheels      Prior Function Level of Independence: Independent with assistive device(s)         Comments: uses rollator vs cane; hires someone to do cleaning and laundry; wants to hire someone to assist with bathing     Hand Dominance   Dominant Hand: Right    Extremity/Trunk Assessment   Upper Extremity Assessment Upper Extremity Assessment: Overall WFL for tasks assessed    Lower Extremity Assessment Lower Extremity Assessment: Overall WFL for tasks assessed    Cervical / Trunk Assessment Cervical / Trunk Assessment: Kyphotic  Communication   Communication: HOH  Cognition Arousal/Alertness: Awake/alert Behavior During Therapy: WFL for tasks assessed/performed Overall Cognitive Status: Within Functional Limits for tasks assessed  General Comments      Exercises     Assessment/Plan    PT Assessment Patent does not need any further PT services  PT Problem List         PT Treatment Interventions      PT Goals (Current goals can be found in the Care Plan section)  Acute Rehab PT Goals Patient Stated Goal: return to his apartment with assist from a home health aide for bathing; wants a motorized scooter PT Goal Formulation: All assessment and education complete, DC therapy    Frequency     Barriers to discharge        Co-evaluation               AM-PAC PT "6 Clicks" Daily Activity  Outcome Measure  Difficulty turning over in bed (including adjusting bedclothes, sheets and blankets)?: None Difficulty moving from lying on back to sitting on the side of the bed? : None Difficulty sitting down on and standing up from a chair with arms (e.g., wheelchair, bedside commode, etc,.)?: None Help needed moving to and from a bed to chair (including a wheelchair)?: None Help needed walking in hospital room?: A Lot Help needed climbing 3-5 steps with a railing? : Total 6 Click Score: 19    End of Session Equipment Utilized During Treatment: Oxygen Activity Tolerance: Patient limited by fatigue Patient left: in chair;with call bell/phone within reach;with chair alarm set Nurse Communication: Mobility status      Time: 3704-8889 PT Time Calculation (min) (ACUTE ONLY): 14 min   Charges:   PT Evaluation $PT Eval Low Complexity: 1 Procedure     PT G Codes:   PT G-Codes **NOT FOR INPATIENT CLASS** Mobility: Walking and Moving Around Current Status (V6945): At least 80 percent but less than 100 percent impaired, limited or restricted Mobility: Walking and Moving Around Goal Status [phone]): At least 80 percent but less than 100 percent impaired, limited or restricted      Philomena Doheny 10/15/2016, 12:16 PM [phone]

## 2016-10-15 NOTE — Progress Notes (Signed)
[facility] MSW note: Pt is a current [facility] home care pt. Pt is a DNR. This is a related hospice admission. Pt was sitting up in recliner. Pt was alert and oriented. Pt is forgetful at times. Pt denied pain. MSW explored hospital discharge plans. Pt desires to return home with continued [facility] services. Pt does not want to go to Skilled nursing facility for long term placement or rehab. Pt already has in place a private person to do his laundry and housekeeping 1x week. Pt plans to have daughter-Brenda go to grocery store to buy prepared meals he can put in microwave. Pt does desire hospice aide services from [facility] which will coordinated when pt gets home. Pt is open to hiring sitters from an agency for a few hours. MSW asked staff RN =Scott to give pt list of agencies. Pt plans to look into getting a scooter with daughter-Brenda's help. MSW discussed the above with daughter-Brenda as requested by pt during phone call. Daughter agreed to plan. MSW discussed case with Margaretmary Eddy, [facility] RN liaison.   Marilynne Halsted, MSW [phone]

## 2016-10-16 DIAGNOSIS — N183 Chronic kidney disease, stage 3 (moderate): Secondary | ICD-10-CM | POA: Diagnosis not present

## 2016-10-16 DIAGNOSIS — I5030 Unspecified diastolic (congestive) heart failure: Secondary | ICD-10-CM | POA: Diagnosis not present

## 2016-10-16 DIAGNOSIS — I495 Sick sinus syndrome: Secondary | ICD-10-CM | POA: Diagnosis not present

## 2016-10-16 DIAGNOSIS — J449 Chronic obstructive pulmonary disease, unspecified: Secondary | ICD-10-CM | POA: Diagnosis not present

## 2016-10-16 DIAGNOSIS — I739 Peripheral vascular disease, unspecified: Secondary | ICD-10-CM | POA: Diagnosis not present

## 2016-10-16 DIAGNOSIS — I25810 Atherosclerosis of coronary artery bypass graft(s) without angina pectoris: Secondary | ICD-10-CM | POA: Diagnosis not present

## 2016-10-19 NOTE — Discharge Summary (Signed)
[facility] Discharge Summary   Patient: Eric [name] BDZ:[id number]   PCP: [name], MD DOB: 1926-12-21   Date of admission: 10/12/2016   Date of discharge: 10/15/2016    Discharge Diagnoses:  Principal Problem:   Acute respiratory failure with hypoxia ([place]) Active Problems:   Coronary atherosclerosis   COPD mixed type ([place])   GERD   Obstructive sleep apnea   Pacemaker   Obesity (BMI 30-39.9)   CML (chronic myeloid leukemia) ([place])   Essential hypertension   Acquired pancytopenia ([facility])   Anemia in neoplastic disease   Bilateral pleural effusion   Acute respiratory failure ([place])   Admitted From: home Disposition:  Home with hospice  Recommendations for Outpatient Follow-up:  1. Please follow up with PCP in 1 week   Follow-up Information    [name], MD. Schedule an appointment as soon as possible for a visit in 1 week(s).   Specialty:  Internal Medicine Contact information: [place] [address] [phone]        [facility] Follow up.   Specialty:  Hospice and Palliative Medicine Contact information: [place] [address] [phone]          Diet recommendation: cardiac diet  Activity: The patient is advised to gradually reintroduce usual activities.  Discharge Condition: good  Code Status: DNR DNI  History of present illness: As per the H and P dictated on admission, "Eric [name] is a 83 y.o. male with medical history significant chronic respiratory failure on 2 L oxygen via nasal cannula at home related to CHF and COPD, CML, hypertension presents to the emergency Department chief complaint sudden worsening shortness of breath. Initial evaluation reveals acute on chronic respiratory failure likely related to acute on chronic diastolic heart failure with worsening bilateral pleural effusions in the setting of mild COPD exacerbation.  Information is obtained from  the patient and the chart. He states he was awakened from his sleep around 4:30 with sudden onset shortness of breath. He denies chest pain palpitations fever chills recent travel or sick contacts. He denies headache dizziness syncope or near-syncope. He notes that his lower extremity edema may be worsening. He is unable to bear weight get out of bed due to generalized weakness. He denies abdominal pain nausea vomiting diarrhea constipation melena bright red blood per rectum. He denies dysuria hematuria frequency or urgency."  Hospital Course:  Summary of his active problems in the hospital is as following. 1. Acute on chronic diastolic CHF. Acute on chronic hypoxic respiratory failure. Severe COPD. Patient -3.6 L so far. No chest pain. Oxygenation still significantly low on ambulation. Bilateral crackles present on examination. Given IV Lasix 80 mg twice a day. Renal function worsened, changed to oral lasix and renal function stable. Continue increase dose lasix at home Continue Flonase, continue nebulizers.  2. Symptomatic anemia.  patient was given 1units of PRBC transfusion . No active bleeding reported. H&H remained stable after transfusion. We'll continue to monitor.  3. Goals of care, hospice patient. Patient is recently established with hospice, currently his only and primary concern is that he does not have services available for helping him with daily bathing and making his bedding at home. He understands his prognosis, also understands he is not a candidate for residential hospice. Worked with physical therapy but refused any other recommendation. I discussed with hospice of [place] to get aforementioned services at home for transition.  4. CML. Patient is on maintenance treatment  for the same.  Outpatient follow up for same  5. Essential hypertension. Continue home regimen.   All other chronic medical condition were stable during the hospitalization.  Patient was  seen by hospice daily.  On the day of the discharge the patient's vitals were stable, and no other acute medical condition were reported by patient. the patient was felt safe to be discharge at home with hospice.  Procedures and Results:  PRBC transfusion   Consultations:  Hospice  DISCHARGE MEDICATION: Discharge Medication List as of 10/15/2016  5:47 PM    START taking these medications   Details  furosemide (LASIX) 40 MG tablet Take 2 tablets (80 mg total) by mouth 2 (two) times daily. For 3 days, then start taking 1 tablet twice a day., Starting Thu 10/15/2016, Normal    guaiFENesin-dextromethorphan (ROBITUSSIN DM) 100-10 MG/5ML syrup Take 5 mLs by mouth every 4 (four) hours as needed for cough., Starting Thu 10/15/2016, Normal    potassium chloride (K-DUR) 10 MEQ tablet Take 2 tablets (20 mEq total) by mouth daily., Starting Thu 10/15/2016, Normal      CONTINUE these medications which have CHANGED   Details  albuterol (PROVENTIL) (2.5 MG/3ML) 0.083% nebulizer solution Take 3 mLs (2.5 mg total) by nebulization 4 (four) times daily., Starting Thu 10/15/2016, Normal      CONTINUE these medications which have NOT CHANGED   Details  acetaminophen (TYLENOL) 500 MG tablet Take 1,000 mg by mouth every 6 (six) hours as needed for mild pain or fever., Historical Med    albuterol (PROVENTIL HFA) 108 (90 Base) MCG/ACT inhaler Inhale 2 puffs into the lungs every 4 (four) hours as needed for shortness of breath. Wheezing, Starting Tue 06/09/2016, Normal    albuterol-ipratropium (COMBIVENT) 18-103 MCG/ACT inhaler Inhale 1 puff into the lungs every 6 (six) hours as needed for wheezing or shortness of breath., Historical Med    aspirin 81 MG tablet Take 81 mg by mouth every evening. , Historical Med    dasatinib (SPRYCEL) 70 MG tablet Take 1 tablet (70 mg total) by mouth daily., Starting Mon 08/10/2016, Print    feeding supplement, ENSURE ENLIVE, (ENSURE ENLIVE) LIQD Take 237 mLs by mouth 2 (two)  times daily between meals., Starting Tue 06/23/2016, Normal    fluticasone (FLONASE) 50 MCG/ACT nasal spray Place 2 sprays into both nostrils daily. Allergies, Starting Wed 08/01/2014, Print    HYDROcodone-acetaminophen (NORCO) 7.5-325 MG tablet Take 1 tablet by mouth every 6 (six) hours as needed for moderate pain., Historical Med    nitroGLYCERIN (NITROSTAT) 0.4 MG SL tablet Place 0.4 mg under the tongue every 5 (five) minutes as needed for chest pain. Reported on 12/12/2015, Historical Med    Polyethyl Glycol-Propyl Glycol (SYSTANE) 0.4-0.3 % SOLN Apply 2 drops to eye 3 (three) times daily as needed (dry eyes). , Historical Med    pravastatin (PRAVACHOL) 20 MG tablet Take 3 tablets (60 mg total) by mouth daily., Starting Wed 08/01/2014, Print    ranitidine (ZANTAC) 300 MG tablet Take 300 mg by mouth at bedtime. , Historical Med    triamcinolone cream (KENALOG) 0.1 % Apply 1 application topically 3 (three) times daily as needed (dry skin.). , Starting Fri 07/19/2015, Historical Med    umeclidinium-vilanterol (ANORO ELLIPTA) 62.5-25 MCG/INH AEPB Inhale 1 puff into the lungs daily., Starting Tue 07/21/2016, Sample    valsartan (DIOVAN) 160 MG tablet Take 1 tablet (160 mg total) by mouth daily., Starting Wed 08/01/2014, Print       Allergies  Allergen Reactions  . Actos [Pioglitazone Hydrochloride] Other (See Comments)    "felt funny, drowsy, and weak":  [name] Buprenorphine Hcl Nausea And Vomiting  . Celebrex [Celecoxib] Other (See Comments)    "felt funny"  . Demerol Palpitations and Other (See Comments)    Increased BP  . Meperidine Palpitations    Other reaction(s): Other (See Comments) Increased BP  . Morphine And Related Nausea And Vomiting  . Ciprofloxacin Other (See Comments)    arthralgia  . Metformin Nausea And Vomiting  . Zocor [Simvastatin] Other (See Comments)    Makes pt very drowsy   Discharge Instructions    Diet - low sodium heart healthy    Complete by:  As directed     Discharge instructions    Complete by:  As directed    It is important that you read following instructions as well as go over your medication list with RN to help you understand your care after this hospitalization.  Discharge Instructions   Please follow-up with PCP in one week  Please request your primary care physician to go over all Hospital Tests and Procedure/Radiological results at the follow up,  Please get all Hospital records sent to your PCP by signing hospital release before you go home.   Do not drive, operating heavy machinery, perform activities at heights, swimming or participation in water activities or provide baby sitting services; until you have been seen by Primary Care Physician or a Neurologist and advised to do so again. Do not take more than prescribed Pain, Sleep and Anxiety Medications. You were cared for by a hospitalist during your hospital stay. If you have any questions about your discharge medications or the care you received while you were in the hospital after you are discharged, you can call the unit and ask to speak with the hospitalist on call if the hospitalist that took care of you is not available.  Once you are discharged, your primary care physician will handle any further medical issues. Please note that NO REFILLS for any discharge medications will be authorized once you are discharged, as it is imperative that you return to your primary care physician (or establish a relationship with a primary care physician if you do not have one) for your aftercare needs so that they can reassess your need for medications and monitor your lab values. You Must read complete instructions/literature along with all the possible adverse reactions/side effects for all the Medicines you take and that have been prescribed to you. Take any new Medicines after you have completely understood and accept all the possible adverse reactions/side effects. Wear Seat belts while  driving.   Increase activity slowly    Complete by:  As directed      Discharge Exam: Filed Weights   10/13/16 0531 10/14/16 0529 10/15/16 4854  Weight: 95.3 kg (210 lb 3.2 oz) 94.4 kg (208 lb 3.2 oz) 90.1 kg (198 lb 9.6 oz)   Vitals:   10/15/16 1229 10/15/16 1454  BP:  (!) 142/55  Pulse: 84 85  Resp: 18 17  Temp:  98.7 F (37.1 C)   General: Appear in mild distress, no Rash; Oral Mucosa moist. Cardiovascular: S1 and S2 Present, aortic systolic  Murmur, no JVD Respiratory: Bilateral Air entry present and bilateral Crackles, no wheezes Abdomen: Bowel Sound present, Soft and no tenderness Extremities: trace Pedal edema, no calf tenderness Neurology: Grossly no focal neuro deficit.  The results of significant diagnostics from this hospitalization (including imaging, microbiology, ancillary  and laboratory) are listed below for reference.    Significant Diagnostic Studies: Dg Chest Port 1 View  Result Date: 10/13/2016 CLINICAL DATA:  Shortness of breath and cough. EXAM: PORTABLE CHEST 1 VIEW COMPARISON:  10/12/2016 FINDINGS: There continues to be bilateral pleural effusions, left side greater than right. These pleural effusions are moderate in size. Prominent lung markings bilaterally suggestive for vascular congestion or mild edema. The cardiac silhouette is obscured by the chest densities but cannot exclude cardiomegaly. There is a left dual chamber cardiac pacemaker with post CABG changes. Atherosclerotic calcifications at the aortic arch. Negative for a pneumothorax. IMPRESSION: Bilateral pleural effusions with vascular congestion or mild pulmonary edema. Minimal change from the previous examination. Electronically Signed   By: Markus Daft M.D.   On: 10/13/2016 07:42   Dg Chest Port 1 View  Result Date: 10/12/2016 CLINICAL DATA:  Cough and dyspnea EXAM: PORTABLE CHEST 1 VIEW COMPARISON:  07/01/2013 FINDINGS: Progressive bilateral pleural effusions which are now moderate in size.  Progressive bibasilar atelectasis. Pulmonary vascular congestion. Mild pulmonary edema has progressed in the interval. CABG with dual lead pacemaker IMPRESSION: Progressive heart failure with edema and enlarging bilateral effusions. Progression of bibasilar atelectasis. Electronically Signed   By: Franchot Gallo M.D.   On: 10/12/2016 09:07    Microbiology: No results found for this or any previous visit (from the past 240 hour(s)).   Labs: CBC:  Recent Labs Lab 10/14/16 0733 10/15/16 0429  WBC 3.8* 3.0*  HGB 8.1* 8.2*  HCT 26.5* 25.9*  MCV 99.3 97.7  PLT 164 952   Basic Metabolic Panel:  Recent Labs Lab 10/13/16 0553 10/14/16 0733 10/15/16 0429  NA 137 138 138  K 3.9 3.8 3.2*  CL 102 103 100*  CO2 25 26 28   GLUCOSE 125* 101* 109*  BUN 21* 22* 21*  CREATININE 1.23 1.28* 1.21  CALCIUM 8.8* 8.7* 8.8*   BNP (last 3 results)  Recent Labs  06/02/16 0900 06/29/16 1435 10/12/16 0904  BNP 112.4* 223.0* 496.0*   CBG: No results for input(s): GLUCAP in the last 168 hours. Time spent: 35 minutes  Signed:  Berle Mull  [facility] 10/15/2016 , 2:28 PM

## 2016-10-20 ENCOUNTER — Encounter: Payer: Self-pay | Admitting: Internal Medicine

## 2016-10-20 DIAGNOSIS — I5030 Unspecified diastolic (congestive) heart failure: Secondary | ICD-10-CM | POA: Diagnosis not present

## 2016-10-20 DIAGNOSIS — Z950 Presence of cardiac pacemaker: Secondary | ICD-10-CM | POA: Diagnosis not present

## 2016-10-20 DIAGNOSIS — I739 Peripheral vascular disease, unspecified: Secondary | ICD-10-CM | POA: Diagnosis not present

## 2016-10-20 DIAGNOSIS — J449 Chronic obstructive pulmonary disease, unspecified: Secondary | ICD-10-CM | POA: Diagnosis not present

## 2016-10-20 DIAGNOSIS — I495 Sick sinus syndrome: Secondary | ICD-10-CM | POA: Diagnosis not present

## 2016-10-20 DIAGNOSIS — N183 Chronic kidney disease, stage 3 (moderate): Secondary | ICD-10-CM | POA: Diagnosis not present

## 2016-10-20 DIAGNOSIS — I25810 Atherosclerosis of coronary artery bypass graft(s) without angina pectoris: Secondary | ICD-10-CM | POA: Diagnosis not present

## 2016-10-21 DIAGNOSIS — I25810 Atherosclerosis of coronary artery bypass graft(s) without angina pectoris: Secondary | ICD-10-CM | POA: Diagnosis not present

## 2016-10-21 DIAGNOSIS — I495 Sick sinus syndrome: Secondary | ICD-10-CM | POA: Diagnosis not present

## 2016-10-21 DIAGNOSIS — I5030 Unspecified diastolic (congestive) heart failure: Secondary | ICD-10-CM | POA: Diagnosis not present

## 2016-10-21 DIAGNOSIS — J449 Chronic obstructive pulmonary disease, unspecified: Secondary | ICD-10-CM | POA: Diagnosis not present

## 2016-10-21 DIAGNOSIS — N183 Chronic kidney disease, stage 3 (moderate): Secondary | ICD-10-CM | POA: Diagnosis not present

## 2016-10-21 DIAGNOSIS — I739 Peripheral vascular disease, unspecified: Secondary | ICD-10-CM | POA: Diagnosis not present

## 2016-10-23 DIAGNOSIS — N183 Chronic kidney disease, stage 3 (moderate): Secondary | ICD-10-CM | POA: Diagnosis not present

## 2016-10-23 DIAGNOSIS — I5030 Unspecified diastolic (congestive) heart failure: Secondary | ICD-10-CM | POA: Diagnosis not present

## 2016-10-23 DIAGNOSIS — I25810 Atherosclerosis of coronary artery bypass graft(s) without angina pectoris: Secondary | ICD-10-CM | POA: Diagnosis not present

## 2016-10-23 DIAGNOSIS — I739 Peripheral vascular disease, unspecified: Secondary | ICD-10-CM | POA: Diagnosis not present

## 2016-10-23 DIAGNOSIS — I495 Sick sinus syndrome: Secondary | ICD-10-CM | POA: Diagnosis not present

## 2016-10-23 DIAGNOSIS — J449 Chronic obstructive pulmonary disease, unspecified: Secondary | ICD-10-CM | POA: Diagnosis not present

## 2016-10-26 DIAGNOSIS — I739 Peripheral vascular disease, unspecified: Secondary | ICD-10-CM | POA: Diagnosis not present

## 2016-10-26 DIAGNOSIS — J449 Chronic obstructive pulmonary disease, unspecified: Secondary | ICD-10-CM | POA: Diagnosis not present

## 2016-10-26 DIAGNOSIS — N183 Chronic kidney disease, stage 3 (moderate): Secondary | ICD-10-CM | POA: Diagnosis not present

## 2016-10-26 DIAGNOSIS — I495 Sick sinus syndrome: Secondary | ICD-10-CM | POA: Diagnosis not present

## 2016-10-26 DIAGNOSIS — I25810 Atherosclerosis of coronary artery bypass graft(s) without angina pectoris: Secondary | ICD-10-CM | POA: Diagnosis not present

## 2016-10-26 DIAGNOSIS — I5030 Unspecified diastolic (congestive) heart failure: Secondary | ICD-10-CM | POA: Diagnosis not present

## 2016-10-27 ENCOUNTER — Telehealth: Payer: Self-pay | Admitting: Internal Medicine

## 2016-10-27 DIAGNOSIS — J449 Chronic obstructive pulmonary disease, unspecified: Secondary | ICD-10-CM | POA: Diagnosis not present

## 2016-10-27 DIAGNOSIS — I739 Peripheral vascular disease, unspecified: Secondary | ICD-10-CM | POA: Diagnosis not present

## 2016-10-27 DIAGNOSIS — H02051 Trichiasis without entropian right upper eyelid: Secondary | ICD-10-CM | POA: Diagnosis not present

## 2016-10-27 DIAGNOSIS — I495 Sick sinus syndrome: Secondary | ICD-10-CM | POA: Diagnosis not present

## 2016-10-27 DIAGNOSIS — H02055 Trichiasis without entropian left lower eyelid: Secondary | ICD-10-CM | POA: Diagnosis not present

## 2016-10-27 DIAGNOSIS — I5030 Unspecified diastolic (congestive) heart failure: Secondary | ICD-10-CM | POA: Diagnosis not present

## 2016-10-27 DIAGNOSIS — I25810 Atherosclerosis of coronary artery bypass graft(s) without angina pectoris: Secondary | ICD-10-CM | POA: Diagnosis not present

## 2016-10-27 DIAGNOSIS — H52203 Unspecified astigmatism, bilateral: Secondary | ICD-10-CM | POA: Diagnosis not present

## 2016-10-27 DIAGNOSIS — N183 Chronic kidney disease, stage 3 (moderate): Secondary | ICD-10-CM | POA: Diagnosis not present

## 2016-10-27 DIAGNOSIS — H02054 Trichiasis without entropian left upper eyelid: Secondary | ICD-10-CM | POA: Diagnosis not present

## 2016-10-27 NOTE — Telephone Encounter (Signed)
[place] number [phone]  Called in and needs a script for a tax waiver for a power  Scooter.  He would save $200.00 on sale tax.  This Is going to deliver tomorrow and need this asap.   Needs to say Hospic pt   Fax number [phone] Attn Elta [place]

## 2016-10-28 DIAGNOSIS — N183 Chronic kidney disease, stage 3 (moderate): Secondary | ICD-10-CM | POA: Diagnosis not present

## 2016-10-28 DIAGNOSIS — J449 Chronic obstructive pulmonary disease, unspecified: Secondary | ICD-10-CM | POA: Diagnosis not present

## 2016-10-28 DIAGNOSIS — I5030 Unspecified diastolic (congestive) heart failure: Secondary | ICD-10-CM | POA: Diagnosis not present

## 2016-10-28 DIAGNOSIS — I495 Sick sinus syndrome: Secondary | ICD-10-CM | POA: Diagnosis not present

## 2016-10-28 DIAGNOSIS — I739 Peripheral vascular disease, unspecified: Secondary | ICD-10-CM | POA: Diagnosis not present

## 2016-10-28 DIAGNOSIS — I25810 Atherosclerosis of coronary artery bypass graft(s) without angina pectoris: Secondary | ICD-10-CM | POA: Diagnosis not present

## 2016-10-28 NOTE — Telephone Encounter (Signed)
Eric [name] called office. I spoke with her. From what I understand she has got this taken care of now. ???

## 2016-10-28 NOTE — Telephone Encounter (Signed)
LVM for Maura to call back, that I wasn't sure what she was asking for and that PCP is off this afternoon

## 2016-10-29 DIAGNOSIS — I495 Sick sinus syndrome: Secondary | ICD-10-CM | POA: Diagnosis not present

## 2016-10-29 DIAGNOSIS — I739 Peripheral vascular disease, unspecified: Secondary | ICD-10-CM | POA: Diagnosis not present

## 2016-10-29 DIAGNOSIS — J449 Chronic obstructive pulmonary disease, unspecified: Secondary | ICD-10-CM | POA: Diagnosis not present

## 2016-10-29 DIAGNOSIS — I5030 Unspecified diastolic (congestive) heart failure: Secondary | ICD-10-CM | POA: Diagnosis not present

## 2016-10-29 DIAGNOSIS — N183 Chronic kidney disease, stage 3 (moderate): Secondary | ICD-10-CM | POA: Diagnosis not present

## 2016-10-29 DIAGNOSIS — I25810 Atherosclerosis of coronary artery bypass graft(s) without angina pectoris: Secondary | ICD-10-CM | POA: Diagnosis not present

## 2016-10-30 DIAGNOSIS — I5030 Unspecified diastolic (congestive) heart failure: Secondary | ICD-10-CM | POA: Diagnosis not present

## 2016-10-30 DIAGNOSIS — J449 Chronic obstructive pulmonary disease, unspecified: Secondary | ICD-10-CM | POA: Diagnosis not present

## 2016-10-30 DIAGNOSIS — I739 Peripheral vascular disease, unspecified: Secondary | ICD-10-CM | POA: Diagnosis not present

## 2016-10-30 DIAGNOSIS — I25810 Atherosclerosis of coronary artery bypass graft(s) without angina pectoris: Secondary | ICD-10-CM | POA: Diagnosis not present

## 2016-10-30 DIAGNOSIS — N183 Chronic kidney disease, stage 3 (moderate): Secondary | ICD-10-CM | POA: Diagnosis not present

## 2016-10-30 DIAGNOSIS — I495 Sick sinus syndrome: Secondary | ICD-10-CM | POA: Diagnosis not present

## 2016-11-03 ENCOUNTER — Other Ambulatory Visit: Payer: Medicare Other

## 2016-11-03 ENCOUNTER — Ambulatory Visit: Admitting: Hematology and Oncology

## 2016-11-03 ENCOUNTER — Telehealth: Payer: Self-pay

## 2016-11-03 ENCOUNTER — Encounter: Payer: Self-pay | Admitting: Hematology and Oncology

## 2016-11-03 ENCOUNTER — Other Ambulatory Visit: Payer: Self-pay | Admitting: Hematology and Oncology

## 2016-11-03 ENCOUNTER — Other Ambulatory Visit

## 2016-11-03 ENCOUNTER — Ambulatory Visit: Payer: Medicare Other | Admitting: Hematology and Oncology

## 2016-11-03 DIAGNOSIS — I495 Sick sinus syndrome: Secondary | ICD-10-CM | POA: Diagnosis not present

## 2016-11-03 DIAGNOSIS — I25810 Atherosclerosis of coronary artery bypass graft(s) without angina pectoris: Secondary | ICD-10-CM | POA: Diagnosis not present

## 2016-11-03 DIAGNOSIS — I5030 Unspecified diastolic (congestive) heart failure: Secondary | ICD-10-CM | POA: Diagnosis not present

## 2016-11-03 DIAGNOSIS — C9210 Chronic myeloid leukemia, BCR/ABL-positive, not having achieved remission: Secondary | ICD-10-CM

## 2016-11-03 DIAGNOSIS — N183 Chronic kidney disease, stage 3 (moderate): Secondary | ICD-10-CM | POA: Diagnosis not present

## 2016-11-03 DIAGNOSIS — J449 Chronic obstructive pulmonary disease, unspecified: Secondary | ICD-10-CM | POA: Diagnosis not present

## 2016-11-03 DIAGNOSIS — I739 Peripheral vascular disease, unspecified: Secondary | ICD-10-CM | POA: Diagnosis not present

## 2016-11-03 NOTE — Telephone Encounter (Signed)
Nurse, Maura with Hospice called and left message.  She was calling as a advocate for the patient about not coming to see Dr. Alvy Bimler, that it is not realistic for patient to come to the office.  Patient is short of breath from COPD, has difficulty getting out of the home and uses 3 liters n/c. Patient is still taking Sprycel and has a supply at home. He does not plan on renewing Sprycel.

## 2016-11-04 DIAGNOSIS — I25810 Atherosclerosis of coronary artery bypass graft(s) without angina pectoris: Secondary | ICD-10-CM | POA: Diagnosis not present

## 2016-11-04 DIAGNOSIS — I495 Sick sinus syndrome: Secondary | ICD-10-CM | POA: Diagnosis not present

## 2016-11-04 DIAGNOSIS — I5030 Unspecified diastolic (congestive) heart failure: Secondary | ICD-10-CM | POA: Diagnosis not present

## 2016-11-04 DIAGNOSIS — J449 Chronic obstructive pulmonary disease, unspecified: Secondary | ICD-10-CM | POA: Diagnosis not present

## 2016-11-04 DIAGNOSIS — N183 Chronic kidney disease, stage 3 (moderate): Secondary | ICD-10-CM | POA: Diagnosis not present

## 2016-11-04 DIAGNOSIS — I739 Peripheral vascular disease, unspecified: Secondary | ICD-10-CM | POA: Diagnosis not present

## 2016-11-06 ENCOUNTER — Telehealth: Payer: Self-pay | Admitting: Hematology and Oncology

## 2016-11-06 DIAGNOSIS — C61 Malignant neoplasm of prostate: Secondary | ICD-10-CM | POA: Diagnosis not present

## 2016-11-06 DIAGNOSIS — E1159 Type 2 diabetes mellitus with other circulatory complications: Secondary | ICD-10-CM | POA: Diagnosis not present

## 2016-11-06 DIAGNOSIS — E785 Hyperlipidemia, unspecified: Secondary | ICD-10-CM | POA: Diagnosis not present

## 2016-11-06 DIAGNOSIS — I5030 Unspecified diastolic (congestive) heart failure: Secondary | ICD-10-CM | POA: Diagnosis not present

## 2016-11-06 DIAGNOSIS — K219 Gastro-esophageal reflux disease without esophagitis: Secondary | ICD-10-CM | POA: Diagnosis not present

## 2016-11-06 DIAGNOSIS — J449 Chronic obstructive pulmonary disease, unspecified: Secondary | ICD-10-CM | POA: Diagnosis not present

## 2016-11-06 DIAGNOSIS — J309 Allergic rhinitis, unspecified: Secondary | ICD-10-CM | POA: Diagnosis not present

## 2016-11-06 DIAGNOSIS — H11149 Conjunctival xerosis, unspecified, unspecified eye: Secondary | ICD-10-CM | POA: Diagnosis not present

## 2016-11-06 DIAGNOSIS — R0902 Hypoxemia: Secondary | ICD-10-CM | POA: Diagnosis not present

## 2016-11-06 DIAGNOSIS — N183 Chronic kidney disease, stage 3 (moderate): Secondary | ICD-10-CM | POA: Diagnosis not present

## 2016-11-06 DIAGNOSIS — I10 Essential (primary) hypertension: Secondary | ICD-10-CM | POA: Diagnosis not present

## 2016-11-06 DIAGNOSIS — I739 Peripheral vascular disease, unspecified: Secondary | ICD-10-CM | POA: Diagnosis not present

## 2016-11-06 DIAGNOSIS — I25810 Atherosclerosis of coronary artery bypass graft(s) without angina pectoris: Secondary | ICD-10-CM | POA: Diagnosis not present

## 2016-11-06 DIAGNOSIS — C9290 Myeloid leukemia, unspecified, not having achieved remission: Secondary | ICD-10-CM | POA: Diagnosis not present

## 2016-11-06 DIAGNOSIS — I495 Sick sinus syndrome: Secondary | ICD-10-CM | POA: Diagnosis not present

## 2016-11-06 NOTE — Telephone Encounter (Signed)
Tammi, per discussion with hospice, I do not believe we need to reschedule, correct? Caryl Pina, we will send scheduling msg separately if needed

## 2016-11-06 NOTE — Telephone Encounter (Signed)
Does he need to be seen sooner?

## 2016-11-06 NOTE — Telephone Encounter (Signed)
Spoke with [place] from Hospice. She will remind patient that he does not need to be seen by Dr Alvy Bimler. Hospice is following him and will contact Dr Alvy Bimler office if necessary

## 2016-11-06 NOTE — Telephone Encounter (Signed)
Patient called to reschedule his appointment on 5/29 but the earliest that was availible was 7/10

## 2016-11-09 DIAGNOSIS — I5030 Unspecified diastolic (congestive) heart failure: Secondary | ICD-10-CM | POA: Diagnosis not present

## 2016-11-09 DIAGNOSIS — J449 Chronic obstructive pulmonary disease, unspecified: Secondary | ICD-10-CM | POA: Diagnosis not present

## 2016-11-09 DIAGNOSIS — I25810 Atherosclerosis of coronary artery bypass graft(s) without angina pectoris: Secondary | ICD-10-CM | POA: Diagnosis not present

## 2016-11-09 DIAGNOSIS — I495 Sick sinus syndrome: Secondary | ICD-10-CM | POA: Diagnosis not present

## 2016-11-09 DIAGNOSIS — N183 Chronic kidney disease, stage 3 (moderate): Secondary | ICD-10-CM | POA: Diagnosis not present

## 2016-11-09 DIAGNOSIS — I739 Peripheral vascular disease, unspecified: Secondary | ICD-10-CM | POA: Diagnosis not present

## 2016-11-10 DIAGNOSIS — I495 Sick sinus syndrome: Secondary | ICD-10-CM | POA: Diagnosis not present

## 2016-11-10 DIAGNOSIS — I25810 Atherosclerosis of coronary artery bypass graft(s) without angina pectoris: Secondary | ICD-10-CM | POA: Diagnosis not present

## 2016-11-10 DIAGNOSIS — I5030 Unspecified diastolic (congestive) heart failure: Secondary | ICD-10-CM | POA: Diagnosis not present

## 2016-11-10 DIAGNOSIS — I739 Peripheral vascular disease, unspecified: Secondary | ICD-10-CM | POA: Diagnosis not present

## 2016-11-10 DIAGNOSIS — N183 Chronic kidney disease, stage 3 (moderate): Secondary | ICD-10-CM | POA: Diagnosis not present

## 2016-11-10 DIAGNOSIS — J449 Chronic obstructive pulmonary disease, unspecified: Secondary | ICD-10-CM | POA: Diagnosis not present

## 2016-11-11 DIAGNOSIS — I495 Sick sinus syndrome: Secondary | ICD-10-CM | POA: Diagnosis not present

## 2016-11-11 DIAGNOSIS — N183 Chronic kidney disease, stage 3 (moderate): Secondary | ICD-10-CM | POA: Diagnosis not present

## 2016-11-11 DIAGNOSIS — I5030 Unspecified diastolic (congestive) heart failure: Secondary | ICD-10-CM | POA: Diagnosis not present

## 2016-11-11 DIAGNOSIS — J449 Chronic obstructive pulmonary disease, unspecified: Secondary | ICD-10-CM | POA: Diagnosis not present

## 2016-11-11 DIAGNOSIS — I739 Peripheral vascular disease, unspecified: Secondary | ICD-10-CM | POA: Diagnosis not present

## 2016-11-11 DIAGNOSIS — I25810 Atherosclerosis of coronary artery bypass graft(s) without angina pectoris: Secondary | ICD-10-CM | POA: Diagnosis not present

## 2016-11-12 ENCOUNTER — Telehealth: Payer: Self-pay | Admitting: Internal Medicine

## 2016-11-12 DIAGNOSIS — I5030 Unspecified diastolic (congestive) heart failure: Secondary | ICD-10-CM | POA: Diagnosis not present

## 2016-11-12 DIAGNOSIS — J449 Chronic obstructive pulmonary disease, unspecified: Secondary | ICD-10-CM | POA: Diagnosis not present

## 2016-11-12 DIAGNOSIS — N183 Chronic kidney disease, stage 3 (moderate): Secondary | ICD-10-CM | POA: Diagnosis not present

## 2016-11-12 DIAGNOSIS — I495 Sick sinus syndrome: Secondary | ICD-10-CM | POA: Diagnosis not present

## 2016-11-12 DIAGNOSIS — I25810 Atherosclerosis of coronary artery bypass graft(s) without angina pectoris: Secondary | ICD-10-CM | POA: Diagnosis not present

## 2016-11-12 DIAGNOSIS — I739 Peripheral vascular disease, unspecified: Secondary | ICD-10-CM | POA: Diagnosis not present

## 2016-11-12 NOTE — Telephone Encounter (Signed)
Requesting refill on potasium 10 meq.  Needs to be sent to [facility].   15 day supply w/ 2 refills.

## 2016-11-13 DIAGNOSIS — I25810 Atherosclerosis of coronary artery bypass graft(s) without angina pectoris: Secondary | ICD-10-CM | POA: Diagnosis not present

## 2016-11-13 DIAGNOSIS — J449 Chronic obstructive pulmonary disease, unspecified: Secondary | ICD-10-CM | POA: Diagnosis not present

## 2016-11-13 DIAGNOSIS — I5030 Unspecified diastolic (congestive) heart failure: Secondary | ICD-10-CM | POA: Diagnosis not present

## 2016-11-13 DIAGNOSIS — I495 Sick sinus syndrome: Secondary | ICD-10-CM | POA: Diagnosis not present

## 2016-11-13 DIAGNOSIS — N183 Chronic kidney disease, stage 3 (moderate): Secondary | ICD-10-CM | POA: Diagnosis not present

## 2016-11-13 DIAGNOSIS — I739 Peripheral vascular disease, unspecified: Secondary | ICD-10-CM | POA: Diagnosis not present

## 2016-11-13 NOTE — Telephone Encounter (Signed)
Okay to send 

## 2016-11-13 NOTE — Telephone Encounter (Signed)
Eric [name] called back.  Informed of MD response.  Eric [name] states she will call into pharmacy since she has a verbal ok.

## 2016-11-14 ENCOUNTER — Telehealth: Payer: Self-pay | Admitting: Internal Medicine

## 2016-11-14 DIAGNOSIS — J449 Chronic obstructive pulmonary disease, unspecified: Secondary | ICD-10-CM | POA: Diagnosis not present

## 2016-11-14 DIAGNOSIS — I495 Sick sinus syndrome: Secondary | ICD-10-CM | POA: Diagnosis not present

## 2016-11-14 DIAGNOSIS — I5030 Unspecified diastolic (congestive) heart failure: Secondary | ICD-10-CM | POA: Diagnosis not present

## 2016-11-14 DIAGNOSIS — I739 Peripheral vascular disease, unspecified: Secondary | ICD-10-CM | POA: Diagnosis not present

## 2016-11-14 DIAGNOSIS — I25810 Atherosclerosis of coronary artery bypass graft(s) without angina pectoris: Secondary | ICD-10-CM | POA: Diagnosis not present

## 2016-11-14 DIAGNOSIS — N183 Chronic kidney disease, stage 3 (moderate): Secondary | ICD-10-CM | POA: Diagnosis not present

## 2016-11-14 NOTE — Telephone Encounter (Signed)
[facility] sent a fax requesting orders.  Hospice nurse - His hearing aid stopped working and he tried to use an older one. His ear is swollen and inflamed. Caller wants to speak to the on call about getting medication. Please advise

## 2016-11-17 ENCOUNTER — Telehealth: Payer: Self-pay | Admitting: Internal Medicine

## 2016-11-17 DIAGNOSIS — I739 Peripheral vascular disease, unspecified: Secondary | ICD-10-CM | POA: Diagnosis not present

## 2016-11-17 DIAGNOSIS — N183 Chronic kidney disease, stage 3 (moderate): Secondary | ICD-10-CM | POA: Diagnosis not present

## 2016-11-17 DIAGNOSIS — J449 Chronic obstructive pulmonary disease, unspecified: Secondary | ICD-10-CM | POA: Diagnosis not present

## 2016-11-17 DIAGNOSIS — I5030 Unspecified diastolic (congestive) heart failure: Secondary | ICD-10-CM | POA: Diagnosis not present

## 2016-11-17 DIAGNOSIS — I495 Sick sinus syndrome: Secondary | ICD-10-CM | POA: Diagnosis not present

## 2016-11-17 DIAGNOSIS — I25810 Atherosclerosis of coronary artery bypass graft(s) without angina pectoris: Secondary | ICD-10-CM | POA: Diagnosis not present

## 2016-11-17 DIAGNOSIS — G4733 Obstructive sleep apnea (adult) (pediatric): Secondary | ICD-10-CM

## 2016-11-17 NOTE — Telephone Encounter (Signed)
Will forward to Dr. Janee Morn office.

## 2016-11-17 NOTE — Telephone Encounter (Signed)
Called and states that [facility] would like [facility] settingschanged [facility]   Would like to change to 10cm H2O   Fax [phone] Attn Hospice/Brian    Please call Abigail Butts back when done

## 2016-11-18 DIAGNOSIS — I739 Peripheral vascular disease, unspecified: Secondary | ICD-10-CM | POA: Diagnosis not present

## 2016-11-18 DIAGNOSIS — J449 Chronic obstructive pulmonary disease, unspecified: Secondary | ICD-10-CM | POA: Diagnosis not present

## 2016-11-18 DIAGNOSIS — N183 Chronic kidney disease, stage 3 (moderate): Secondary | ICD-10-CM | POA: Diagnosis not present

## 2016-11-18 DIAGNOSIS — I5030 Unspecified diastolic (congestive) heart failure: Secondary | ICD-10-CM | POA: Diagnosis not present

## 2016-11-18 DIAGNOSIS — I25810 Atherosclerosis of coronary artery bypass graft(s) without angina pectoris: Secondary | ICD-10-CM | POA: Diagnosis not present

## 2016-11-18 DIAGNOSIS — I495 Sick sinus syndrome: Secondary | ICD-10-CM | POA: Diagnosis not present

## 2016-11-18 NOTE — Telephone Encounter (Signed)
Order has been placed for pressure change to [facility]. Lm for Abigail Butts with hospice to make aware.

## 2016-11-18 NOTE — Telephone Encounter (Signed)
Order- DME [facility]) please change CPAP to 10 cwp, continue mask of choice, humidifier, supplies, AirView     Dx OSA

## 2016-11-19 DIAGNOSIS — I5030 Unspecified diastolic (congestive) heart failure: Secondary | ICD-10-CM | POA: Diagnosis not present

## 2016-11-19 DIAGNOSIS — J449 Chronic obstructive pulmonary disease, unspecified: Secondary | ICD-10-CM | POA: Diagnosis not present

## 2016-11-19 DIAGNOSIS — I25810 Atherosclerosis of coronary artery bypass graft(s) without angina pectoris: Secondary | ICD-10-CM | POA: Diagnosis not present

## 2016-11-19 DIAGNOSIS — I495 Sick sinus syndrome: Secondary | ICD-10-CM | POA: Diagnosis not present

## 2016-11-19 DIAGNOSIS — I739 Peripheral vascular disease, unspecified: Secondary | ICD-10-CM | POA: Diagnosis not present

## 2016-11-19 DIAGNOSIS — N183 Chronic kidney disease, stage 3 (moderate): Secondary | ICD-10-CM | POA: Diagnosis not present

## 2016-11-20 DIAGNOSIS — I25810 Atherosclerosis of coronary artery bypass graft(s) without angina pectoris: Secondary | ICD-10-CM | POA: Diagnosis not present

## 2016-11-20 DIAGNOSIS — N183 Chronic kidney disease, stage 3 (moderate): Secondary | ICD-10-CM | POA: Diagnosis not present

## 2016-11-20 DIAGNOSIS — I739 Peripheral vascular disease, unspecified: Secondary | ICD-10-CM | POA: Diagnosis not present

## 2016-11-20 DIAGNOSIS — I5030 Unspecified diastolic (congestive) heart failure: Secondary | ICD-10-CM | POA: Diagnosis not present

## 2016-11-20 DIAGNOSIS — J449 Chronic obstructive pulmonary disease, unspecified: Secondary | ICD-10-CM | POA: Diagnosis not present

## 2016-11-20 DIAGNOSIS — I495 Sick sinus syndrome: Secondary | ICD-10-CM | POA: Diagnosis not present

## 2016-11-23 DIAGNOSIS — J449 Chronic obstructive pulmonary disease, unspecified: Secondary | ICD-10-CM | POA: Diagnosis not present

## 2016-11-23 DIAGNOSIS — I495 Sick sinus syndrome: Secondary | ICD-10-CM | POA: Diagnosis not present

## 2016-11-23 DIAGNOSIS — N183 Chronic kidney disease, stage 3 (moderate): Secondary | ICD-10-CM | POA: Diagnosis not present

## 2016-11-23 DIAGNOSIS — I5030 Unspecified diastolic (congestive) heart failure: Secondary | ICD-10-CM | POA: Diagnosis not present

## 2016-11-23 DIAGNOSIS — I739 Peripheral vascular disease, unspecified: Secondary | ICD-10-CM | POA: Diagnosis not present

## 2016-11-23 DIAGNOSIS — I25810 Atherosclerosis of coronary artery bypass graft(s) without angina pectoris: Secondary | ICD-10-CM | POA: Diagnosis not present

## 2016-11-24 DIAGNOSIS — I739 Peripheral vascular disease, unspecified: Secondary | ICD-10-CM | POA: Diagnosis not present

## 2016-11-24 DIAGNOSIS — I5030 Unspecified diastolic (congestive) heart failure: Secondary | ICD-10-CM | POA: Diagnosis not present

## 2016-11-24 DIAGNOSIS — I495 Sick sinus syndrome: Secondary | ICD-10-CM | POA: Diagnosis not present

## 2016-11-24 DIAGNOSIS — I25810 Atherosclerosis of coronary artery bypass graft(s) without angina pectoris: Secondary | ICD-10-CM | POA: Diagnosis not present

## 2016-11-24 DIAGNOSIS — J449 Chronic obstructive pulmonary disease, unspecified: Secondary | ICD-10-CM | POA: Diagnosis not present

## 2016-11-24 DIAGNOSIS — N183 Chronic kidney disease, stage 3 (moderate): Secondary | ICD-10-CM | POA: Diagnosis not present

## 2016-11-25 ENCOUNTER — Telehealth: Payer: Self-pay | Admitting: Internal Medicine

## 2016-11-25 DIAGNOSIS — N183 Chronic kidney disease, stage 3 (moderate): Secondary | ICD-10-CM | POA: Diagnosis not present

## 2016-11-25 DIAGNOSIS — I25810 Atherosclerosis of coronary artery bypass graft(s) without angina pectoris: Secondary | ICD-10-CM | POA: Diagnosis not present

## 2016-11-25 DIAGNOSIS — I495 Sick sinus syndrome: Secondary | ICD-10-CM | POA: Diagnosis not present

## 2016-11-25 DIAGNOSIS — I5030 Unspecified diastolic (congestive) heart failure: Secondary | ICD-10-CM | POA: Diagnosis not present

## 2016-11-25 DIAGNOSIS — J449 Chronic obstructive pulmonary disease, unspecified: Secondary | ICD-10-CM | POA: Diagnosis not present

## 2016-11-25 DIAGNOSIS — I739 Peripheral vascular disease, unspecified: Secondary | ICD-10-CM | POA: Diagnosis not present

## 2016-11-25 NOTE — Telephone Encounter (Signed)
Called stating the Pt has stopped his Sprycel, was told by oncologists that we he came to hospice to stop taking it.  He independently has cut his glipizide dose in half, his BS is declining on half a dose.  He has stopped his pravastatin. And stopped his ranitidine, denies acid reflux  Nurse is having trouble getting the Pt to get him to take his Meds at directed, will skip taking his med's some days. Is trying to continue to educate and call if any questions.

## 2016-11-26 DIAGNOSIS — I739 Peripheral vascular disease, unspecified: Secondary | ICD-10-CM | POA: Diagnosis not present

## 2016-11-26 DIAGNOSIS — J449 Chronic obstructive pulmonary disease, unspecified: Secondary | ICD-10-CM | POA: Diagnosis not present

## 2016-11-26 DIAGNOSIS — I495 Sick sinus syndrome: Secondary | ICD-10-CM | POA: Diagnosis not present

## 2016-11-26 DIAGNOSIS — I5030 Unspecified diastolic (congestive) heart failure: Secondary | ICD-10-CM | POA: Diagnosis not present

## 2016-11-26 DIAGNOSIS — N183 Chronic kidney disease, stage 3 (moderate): Secondary | ICD-10-CM | POA: Diagnosis not present

## 2016-11-26 DIAGNOSIS — I25810 Atherosclerosis of coronary artery bypass graft(s) without angina pectoris: Secondary | ICD-10-CM | POA: Diagnosis not present

## 2016-11-27 DIAGNOSIS — N183 Chronic kidney disease, stage 3 (moderate): Secondary | ICD-10-CM | POA: Diagnosis not present

## 2016-11-27 DIAGNOSIS — I739 Peripheral vascular disease, unspecified: Secondary | ICD-10-CM | POA: Diagnosis not present

## 2016-11-27 DIAGNOSIS — I495 Sick sinus syndrome: Secondary | ICD-10-CM | POA: Diagnosis not present

## 2016-11-27 DIAGNOSIS — I5030 Unspecified diastolic (congestive) heart failure: Secondary | ICD-10-CM | POA: Diagnosis not present

## 2016-11-27 DIAGNOSIS — J449 Chronic obstructive pulmonary disease, unspecified: Secondary | ICD-10-CM | POA: Diagnosis not present

## 2016-11-27 DIAGNOSIS — I25810 Atherosclerosis of coronary artery bypass graft(s) without angina pectoris: Secondary | ICD-10-CM | POA: Diagnosis not present

## 2016-11-27 NOTE — Telephone Encounter (Signed)
Please advise 

## 2016-11-29 NOTE — Telephone Encounter (Signed)
Noted. Pleas continue to encourage patient to take medications as prescribed.

## 2016-11-30 DIAGNOSIS — I5030 Unspecified diastolic (congestive) heart failure: Secondary | ICD-10-CM | POA: Diagnosis not present

## 2016-11-30 DIAGNOSIS — N183 Chronic kidney disease, stage 3 (moderate): Secondary | ICD-10-CM | POA: Diagnosis not present

## 2016-11-30 DIAGNOSIS — I495 Sick sinus syndrome: Secondary | ICD-10-CM | POA: Diagnosis not present

## 2016-11-30 DIAGNOSIS — J449 Chronic obstructive pulmonary disease, unspecified: Secondary | ICD-10-CM | POA: Diagnosis not present

## 2016-11-30 DIAGNOSIS — I25810 Atherosclerosis of coronary artery bypass graft(s) without angina pectoris: Secondary | ICD-10-CM | POA: Diagnosis not present

## 2016-11-30 DIAGNOSIS — I739 Peripheral vascular disease, unspecified: Secondary | ICD-10-CM | POA: Diagnosis not present

## 2016-12-01 DIAGNOSIS — I495 Sick sinus syndrome: Secondary | ICD-10-CM | POA: Diagnosis not present

## 2016-12-01 DIAGNOSIS — I25810 Atherosclerosis of coronary artery bypass graft(s) without angina pectoris: Secondary | ICD-10-CM | POA: Diagnosis not present

## 2016-12-01 DIAGNOSIS — N183 Chronic kidney disease, stage 3 (moderate): Secondary | ICD-10-CM | POA: Diagnosis not present

## 2016-12-01 DIAGNOSIS — J449 Chronic obstructive pulmonary disease, unspecified: Secondary | ICD-10-CM | POA: Diagnosis not present

## 2016-12-01 DIAGNOSIS — I739 Peripheral vascular disease, unspecified: Secondary | ICD-10-CM | POA: Diagnosis not present

## 2016-12-01 DIAGNOSIS — I5030 Unspecified diastolic (congestive) heart failure: Secondary | ICD-10-CM | POA: Diagnosis not present

## 2016-12-02 DIAGNOSIS — J449 Chronic obstructive pulmonary disease, unspecified: Secondary | ICD-10-CM | POA: Diagnosis not present

## 2016-12-02 DIAGNOSIS — I25810 Atherosclerosis of coronary artery bypass graft(s) without angina pectoris: Secondary | ICD-10-CM | POA: Diagnosis not present

## 2016-12-02 DIAGNOSIS — I739 Peripheral vascular disease, unspecified: Secondary | ICD-10-CM | POA: Diagnosis not present

## 2016-12-02 DIAGNOSIS — I495 Sick sinus syndrome: Secondary | ICD-10-CM | POA: Diagnosis not present

## 2016-12-02 DIAGNOSIS — N183 Chronic kidney disease, stage 3 (moderate): Secondary | ICD-10-CM | POA: Diagnosis not present

## 2016-12-02 DIAGNOSIS — I5030 Unspecified diastolic (congestive) heart failure: Secondary | ICD-10-CM | POA: Diagnosis not present

## 2016-12-03 DIAGNOSIS — N183 Chronic kidney disease, stage 3 (moderate): Secondary | ICD-10-CM | POA: Diagnosis not present

## 2016-12-03 DIAGNOSIS — I25810 Atherosclerosis of coronary artery bypass graft(s) without angina pectoris: Secondary | ICD-10-CM | POA: Diagnosis not present

## 2016-12-03 DIAGNOSIS — J449 Chronic obstructive pulmonary disease, unspecified: Secondary | ICD-10-CM | POA: Diagnosis not present

## 2016-12-03 DIAGNOSIS — I495 Sick sinus syndrome: Secondary | ICD-10-CM | POA: Diagnosis not present

## 2016-12-03 DIAGNOSIS — I739 Peripheral vascular disease, unspecified: Secondary | ICD-10-CM | POA: Diagnosis not present

## 2016-12-03 DIAGNOSIS — I5030 Unspecified diastolic (congestive) heart failure: Secondary | ICD-10-CM | POA: Diagnosis not present

## 2016-12-03 NOTE — Telephone Encounter (Signed)
Yes, a sample needs to be obtained and sent to the lab. They can get a specimen cup if needed, which would be most ideal.

## 2016-12-03 NOTE — Telephone Encounter (Signed)
Do they just need to bring it to the lab

## 2016-12-03 NOTE — Telephone Encounter (Signed)
Eric [name] called again, Pt is having loose watery stools, she has been asking the pt to stick to a bland diet to get that cleared up. She educated him to take antidiarrhea med and nauseous med.  Pt does not have a fever, Pt is comparing of urinary burning, she is questioning if he should have an antibiotic with him having loose watery stools, does she needs to collect a urine specimen? Or do you want to prescribe pyridium for the burning?   Please advise   [phone]

## 2016-12-03 NOTE — Telephone Encounter (Signed)
Please advise 

## 2016-12-03 NOTE — Telephone Encounter (Signed)
Please obtain UA and urine culture. Trial OTC diarrhea medications if not may need stool culture as well.

## 2016-12-04 ENCOUNTER — Telehealth: Payer: Self-pay

## 2016-12-04 ENCOUNTER — Other Ambulatory Visit (INDEPENDENT_AMBULATORY_CARE_PROVIDER_SITE_OTHER)

## 2016-12-04 ENCOUNTER — Other Ambulatory Visit: Payer: Self-pay | Admitting: Family

## 2016-12-04 DIAGNOSIS — I495 Sick sinus syndrome: Secondary | ICD-10-CM | POA: Diagnosis not present

## 2016-12-04 DIAGNOSIS — I5030 Unspecified diastolic (congestive) heart failure: Secondary | ICD-10-CM | POA: Diagnosis not present

## 2016-12-04 DIAGNOSIS — R300 Dysuria: Secondary | ICD-10-CM | POA: Diagnosis not present

## 2016-12-04 DIAGNOSIS — J449 Chronic obstructive pulmonary disease, unspecified: Secondary | ICD-10-CM | POA: Diagnosis not present

## 2016-12-04 DIAGNOSIS — N183 Chronic kidney disease, stage 3 (moderate): Secondary | ICD-10-CM | POA: Diagnosis not present

## 2016-12-04 DIAGNOSIS — I739 Peripheral vascular disease, unspecified: Secondary | ICD-10-CM | POA: Diagnosis not present

## 2016-12-04 DIAGNOSIS — I25810 Atherosclerosis of coronary artery bypass graft(s) without angina pectoris: Secondary | ICD-10-CM | POA: Diagnosis not present

## 2016-12-04 LAB — URINALYSIS, ROUTINE W REFLEX MICROSCOPIC
BILIRUBIN URINE: NEGATIVE
KETONES UR: NEGATIVE
NITRITE: NEGATIVE
PH: 6 (ref 5.0–8.0)
Specific Gravity, Urine: 1.015 (ref 1.000–1.030)
TOTAL PROTEIN, URINE-UPE24: NEGATIVE
Urine Glucose: NEGATIVE
Urobilinogen, UA: 0.2 (ref 0.0–1.0)

## 2016-12-04 MED ORDER — SULFAMETHOXAZOLE-TRIMETHOPRIM 800-160 MG PO TABS: 1.0000 | ORAL_TABLET | Freq: Two times a day (BID) | ORAL | 0 refills | Status: DC

## 2016-12-04 NOTE — Telephone Encounter (Signed)
Labs entered for patient to drop of urine at lab

## 2016-12-04 NOTE — Telephone Encounter (Signed)
Maura contacted and aware

## 2016-12-06 DIAGNOSIS — E785 Hyperlipidemia, unspecified: Secondary | ICD-10-CM | POA: Diagnosis not present

## 2016-12-06 DIAGNOSIS — I5030 Unspecified diastolic (congestive) heart failure: Secondary | ICD-10-CM | POA: Diagnosis not present

## 2016-12-06 DIAGNOSIS — I25810 Atherosclerosis of coronary artery bypass graft(s) without angina pectoris: Secondary | ICD-10-CM | POA: Diagnosis not present

## 2016-12-06 DIAGNOSIS — I10 Essential (primary) hypertension: Secondary | ICD-10-CM | POA: Diagnosis not present

## 2016-12-06 DIAGNOSIS — J449 Chronic obstructive pulmonary disease, unspecified: Secondary | ICD-10-CM | POA: Diagnosis not present

## 2016-12-06 DIAGNOSIS — R0902 Hypoxemia: Secondary | ICD-10-CM | POA: Diagnosis not present

## 2016-12-06 DIAGNOSIS — C9290 Myeloid leukemia, unspecified, not having achieved remission: Secondary | ICD-10-CM | POA: Diagnosis not present

## 2016-12-06 DIAGNOSIS — I495 Sick sinus syndrome: Secondary | ICD-10-CM | POA: Diagnosis not present

## 2016-12-06 DIAGNOSIS — N183 Chronic kidney disease, stage 3 (moderate): Secondary | ICD-10-CM | POA: Diagnosis not present

## 2016-12-06 DIAGNOSIS — J309 Allergic rhinitis, unspecified: Secondary | ICD-10-CM | POA: Diagnosis not present

## 2016-12-06 DIAGNOSIS — E1159 Type 2 diabetes mellitus with other circulatory complications: Secondary | ICD-10-CM | POA: Diagnosis not present

## 2016-12-06 DIAGNOSIS — K219 Gastro-esophageal reflux disease without esophagitis: Secondary | ICD-10-CM | POA: Diagnosis not present

## 2016-12-06 DIAGNOSIS — I739 Peripheral vascular disease, unspecified: Secondary | ICD-10-CM | POA: Diagnosis not present

## 2016-12-06 DIAGNOSIS — C61 Malignant neoplasm of prostate: Secondary | ICD-10-CM | POA: Diagnosis not present

## 2016-12-06 DIAGNOSIS — H11149 Conjunctival xerosis, unspecified, unspecified eye: Secondary | ICD-10-CM | POA: Diagnosis not present

## 2016-12-06 LAB — URINE CULTURE

## 2016-12-07 ENCOUNTER — Other Ambulatory Visit: Payer: Self-pay | Admitting: Family

## 2016-12-07 ENCOUNTER — Telehealth: Payer: Self-pay | Admitting: *Deleted

## 2016-12-07 ENCOUNTER — Other Ambulatory Visit: Payer: Self-pay

## 2016-12-07 DIAGNOSIS — I5030 Unspecified diastolic (congestive) heart failure: Secondary | ICD-10-CM | POA: Diagnosis not present

## 2016-12-07 DIAGNOSIS — J449 Chronic obstructive pulmonary disease, unspecified: Secondary | ICD-10-CM | POA: Diagnosis not present

## 2016-12-07 DIAGNOSIS — I495 Sick sinus syndrome: Secondary | ICD-10-CM | POA: Diagnosis not present

## 2016-12-07 DIAGNOSIS — N183 Chronic kidney disease, stage 3 (moderate): Secondary | ICD-10-CM | POA: Diagnosis not present

## 2016-12-07 DIAGNOSIS — I25810 Atherosclerosis of coronary artery bypass graft(s) without angina pectoris: Secondary | ICD-10-CM | POA: Diagnosis not present

## 2016-12-07 DIAGNOSIS — I739 Peripheral vascular disease, unspecified: Secondary | ICD-10-CM | POA: Diagnosis not present

## 2016-12-07 MED ORDER — SULFAMETHOXAZOLE-TRIMETHOPRIM 800-160 MG PO TABS: 1.0000 | ORAL_TABLET | Freq: Two times a day (BID) | ORAL | 0 refills | Status: DC

## 2016-12-07 MED ORDER — NITROFURANTOIN MONOHYD MACRO 100 MG PO CAPS: 100.0000 mg | ORAL_CAPSULE | Freq: Two times a day (BID) | ORAL | 0 refills | Status: DC

## 2016-12-07 NOTE — Telephone Encounter (Signed)
Noted. Will send in [place]

## 2016-12-07 NOTE — Telephone Encounter (Signed)
  Rec'd call from pharmacist stating there is a drug interaction w/the Septra script that was sent over. Pt is taking Potassium and when these two meds are taking together Septra antibiotic can increase potassium severely especially in the elders. Wanting to know if MD want to change to something else...Johny Chess

## 2016-12-10 DIAGNOSIS — J449 Chronic obstructive pulmonary disease, unspecified: Secondary | ICD-10-CM | POA: Diagnosis not present

## 2016-12-10 DIAGNOSIS — N183 Chronic kidney disease, stage 3 (moderate): Secondary | ICD-10-CM | POA: Diagnosis not present

## 2016-12-10 DIAGNOSIS — I495 Sick sinus syndrome: Secondary | ICD-10-CM | POA: Diagnosis not present

## 2016-12-10 DIAGNOSIS — I739 Peripheral vascular disease, unspecified: Secondary | ICD-10-CM | POA: Diagnosis not present

## 2016-12-10 DIAGNOSIS — I25810 Atherosclerosis of coronary artery bypass graft(s) without angina pectoris: Secondary | ICD-10-CM | POA: Diagnosis not present

## 2016-12-10 DIAGNOSIS — I5030 Unspecified diastolic (congestive) heart failure: Secondary | ICD-10-CM | POA: Diagnosis not present

## 2016-12-11 DIAGNOSIS — J449 Chronic obstructive pulmonary disease, unspecified: Secondary | ICD-10-CM | POA: Diagnosis not present

## 2016-12-11 DIAGNOSIS — I495 Sick sinus syndrome: Secondary | ICD-10-CM | POA: Diagnosis not present

## 2016-12-11 DIAGNOSIS — I25810 Atherosclerosis of coronary artery bypass graft(s) without angina pectoris: Secondary | ICD-10-CM | POA: Diagnosis not present

## 2016-12-11 DIAGNOSIS — I739 Peripheral vascular disease, unspecified: Secondary | ICD-10-CM | POA: Diagnosis not present

## 2016-12-11 DIAGNOSIS — I5030 Unspecified diastolic (congestive) heart failure: Secondary | ICD-10-CM | POA: Diagnosis not present

## 2016-12-11 DIAGNOSIS — N183 Chronic kidney disease, stage 3 (moderate): Secondary | ICD-10-CM | POA: Diagnosis not present

## 2016-12-14 DIAGNOSIS — I25810 Atherosclerosis of coronary artery bypass graft(s) without angina pectoris: Secondary | ICD-10-CM | POA: Diagnosis not present

## 2016-12-14 DIAGNOSIS — I5030 Unspecified diastolic (congestive) heart failure: Secondary | ICD-10-CM | POA: Diagnosis not present

## 2016-12-14 DIAGNOSIS — J449 Chronic obstructive pulmonary disease, unspecified: Secondary | ICD-10-CM | POA: Diagnosis not present

## 2016-12-14 DIAGNOSIS — I739 Peripheral vascular disease, unspecified: Secondary | ICD-10-CM | POA: Diagnosis not present

## 2016-12-14 DIAGNOSIS — I495 Sick sinus syndrome: Secondary | ICD-10-CM | POA: Diagnosis not present

## 2016-12-14 DIAGNOSIS — N183 Chronic kidney disease, stage 3 (moderate): Secondary | ICD-10-CM | POA: Diagnosis not present

## 2016-12-15 ENCOUNTER — Ambulatory Visit: Admitting: Hematology and Oncology

## 2016-12-15 DIAGNOSIS — N183 Chronic kidney disease, stage 3 (moderate): Secondary | ICD-10-CM | POA: Diagnosis not present

## 2016-12-15 DIAGNOSIS — J449 Chronic obstructive pulmonary disease, unspecified: Secondary | ICD-10-CM | POA: Diagnosis not present

## 2016-12-15 DIAGNOSIS — I25810 Atherosclerosis of coronary artery bypass graft(s) without angina pectoris: Secondary | ICD-10-CM | POA: Diagnosis not present

## 2016-12-15 DIAGNOSIS — I495 Sick sinus syndrome: Secondary | ICD-10-CM | POA: Diagnosis not present

## 2016-12-15 DIAGNOSIS — I5030 Unspecified diastolic (congestive) heart failure: Secondary | ICD-10-CM | POA: Diagnosis not present

## 2016-12-15 DIAGNOSIS — I739 Peripheral vascular disease, unspecified: Secondary | ICD-10-CM | POA: Diagnosis not present

## 2016-12-16 DIAGNOSIS — I25810 Atherosclerosis of coronary artery bypass graft(s) without angina pectoris: Secondary | ICD-10-CM | POA: Diagnosis not present

## 2016-12-16 DIAGNOSIS — I495 Sick sinus syndrome: Secondary | ICD-10-CM | POA: Diagnosis not present

## 2016-12-16 DIAGNOSIS — I739 Peripheral vascular disease, unspecified: Secondary | ICD-10-CM | POA: Diagnosis not present

## 2016-12-16 DIAGNOSIS — N183 Chronic kidney disease, stage 3 (moderate): Secondary | ICD-10-CM | POA: Diagnosis not present

## 2016-12-16 DIAGNOSIS — I5030 Unspecified diastolic (congestive) heart failure: Secondary | ICD-10-CM | POA: Diagnosis not present

## 2016-12-16 DIAGNOSIS — J449 Chronic obstructive pulmonary disease, unspecified: Secondary | ICD-10-CM | POA: Diagnosis not present

## 2016-12-18 DIAGNOSIS — J449 Chronic obstructive pulmonary disease, unspecified: Secondary | ICD-10-CM | POA: Diagnosis not present

## 2016-12-18 DIAGNOSIS — I739 Peripheral vascular disease, unspecified: Secondary | ICD-10-CM | POA: Diagnosis not present

## 2016-12-18 DIAGNOSIS — I5030 Unspecified diastolic (congestive) heart failure: Secondary | ICD-10-CM | POA: Diagnosis not present

## 2016-12-18 DIAGNOSIS — I495 Sick sinus syndrome: Secondary | ICD-10-CM | POA: Diagnosis not present

## 2016-12-18 DIAGNOSIS — I25810 Atherosclerosis of coronary artery bypass graft(s) without angina pectoris: Secondary | ICD-10-CM | POA: Diagnosis not present

## 2016-12-18 DIAGNOSIS — N183 Chronic kidney disease, stage 3 (moderate): Secondary | ICD-10-CM | POA: Diagnosis not present

## 2016-12-21 DIAGNOSIS — I25810 Atherosclerosis of coronary artery bypass graft(s) without angina pectoris: Secondary | ICD-10-CM | POA: Diagnosis not present

## 2016-12-21 DIAGNOSIS — I495 Sick sinus syndrome: Secondary | ICD-10-CM | POA: Diagnosis not present

## 2016-12-21 DIAGNOSIS — J449 Chronic obstructive pulmonary disease, unspecified: Secondary | ICD-10-CM | POA: Diagnosis not present

## 2016-12-21 DIAGNOSIS — I5030 Unspecified diastolic (congestive) heart failure: Secondary | ICD-10-CM | POA: Diagnosis not present

## 2016-12-21 DIAGNOSIS — N183 Chronic kidney disease, stage 3 (moderate): Secondary | ICD-10-CM | POA: Diagnosis not present

## 2016-12-21 DIAGNOSIS — I739 Peripheral vascular disease, unspecified: Secondary | ICD-10-CM | POA: Diagnosis not present

## 2016-12-22 DIAGNOSIS — J449 Chronic obstructive pulmonary disease, unspecified: Secondary | ICD-10-CM | POA: Diagnosis not present

## 2016-12-22 DIAGNOSIS — N183 Chronic kidney disease, stage 3 (moderate): Secondary | ICD-10-CM | POA: Diagnosis not present

## 2016-12-22 DIAGNOSIS — I5030 Unspecified diastolic (congestive) heart failure: Secondary | ICD-10-CM | POA: Diagnosis not present

## 2016-12-22 DIAGNOSIS — I495 Sick sinus syndrome: Secondary | ICD-10-CM | POA: Diagnosis not present

## 2016-12-22 DIAGNOSIS — I25810 Atherosclerosis of coronary artery bypass graft(s) without angina pectoris: Secondary | ICD-10-CM | POA: Diagnosis not present

## 2016-12-22 DIAGNOSIS — I739 Peripheral vascular disease, unspecified: Secondary | ICD-10-CM | POA: Diagnosis not present

## 2016-12-23 DIAGNOSIS — I739 Peripheral vascular disease, unspecified: Secondary | ICD-10-CM | POA: Diagnosis not present

## 2016-12-23 DIAGNOSIS — I25810 Atherosclerosis of coronary artery bypass graft(s) without angina pectoris: Secondary | ICD-10-CM | POA: Diagnosis not present

## 2016-12-23 DIAGNOSIS — I5030 Unspecified diastolic (congestive) heart failure: Secondary | ICD-10-CM | POA: Diagnosis not present

## 2016-12-23 DIAGNOSIS — J449 Chronic obstructive pulmonary disease, unspecified: Secondary | ICD-10-CM | POA: Diagnosis not present

## 2016-12-23 DIAGNOSIS — N183 Chronic kidney disease, stage 3 (moderate): Secondary | ICD-10-CM | POA: Diagnosis not present

## 2016-12-23 DIAGNOSIS — I495 Sick sinus syndrome: Secondary | ICD-10-CM | POA: Diagnosis not present

## 2016-12-25 DIAGNOSIS — I495 Sick sinus syndrome: Secondary | ICD-10-CM | POA: Diagnosis not present

## 2016-12-25 DIAGNOSIS — I739 Peripheral vascular disease, unspecified: Secondary | ICD-10-CM | POA: Diagnosis not present

## 2016-12-25 DIAGNOSIS — J449 Chronic obstructive pulmonary disease, unspecified: Secondary | ICD-10-CM | POA: Diagnosis not present

## 2016-12-25 DIAGNOSIS — I25810 Atherosclerosis of coronary artery bypass graft(s) without angina pectoris: Secondary | ICD-10-CM | POA: Diagnosis not present

## 2016-12-25 DIAGNOSIS — N183 Chronic kidney disease, stage 3 (moderate): Secondary | ICD-10-CM | POA: Diagnosis not present

## 2016-12-25 DIAGNOSIS — I5030 Unspecified diastolic (congestive) heart failure: Secondary | ICD-10-CM | POA: Diagnosis not present

## 2016-12-28 DIAGNOSIS — N183 Chronic kidney disease, stage 3 (moderate): Secondary | ICD-10-CM | POA: Diagnosis not present

## 2016-12-28 DIAGNOSIS — I5030 Unspecified diastolic (congestive) heart failure: Secondary | ICD-10-CM | POA: Diagnosis not present

## 2016-12-28 DIAGNOSIS — I25810 Atherosclerosis of coronary artery bypass graft(s) without angina pectoris: Secondary | ICD-10-CM | POA: Diagnosis not present

## 2016-12-28 DIAGNOSIS — I495 Sick sinus syndrome: Secondary | ICD-10-CM | POA: Diagnosis not present

## 2016-12-28 DIAGNOSIS — I739 Peripheral vascular disease, unspecified: Secondary | ICD-10-CM | POA: Diagnosis not present

## 2016-12-28 DIAGNOSIS — J449 Chronic obstructive pulmonary disease, unspecified: Secondary | ICD-10-CM | POA: Diagnosis not present

## 2016-12-29 ENCOUNTER — Other Ambulatory Visit: Payer: Self-pay | Admitting: Internal Medicine

## 2016-12-29 DIAGNOSIS — N183 Chronic kidney disease, stage 3 (moderate): Secondary | ICD-10-CM | POA: Diagnosis not present

## 2016-12-29 DIAGNOSIS — I25810 Atherosclerosis of coronary artery bypass graft(s) without angina pectoris: Secondary | ICD-10-CM | POA: Diagnosis not present

## 2016-12-29 DIAGNOSIS — I739 Peripheral vascular disease, unspecified: Secondary | ICD-10-CM | POA: Diagnosis not present

## 2016-12-29 DIAGNOSIS — J449 Chronic obstructive pulmonary disease, unspecified: Secondary | ICD-10-CM | POA: Diagnosis not present

## 2016-12-29 DIAGNOSIS — I495 Sick sinus syndrome: Secondary | ICD-10-CM | POA: Diagnosis not present

## 2016-12-29 DIAGNOSIS — I5030 Unspecified diastolic (congestive) heart failure: Secondary | ICD-10-CM | POA: Diagnosis not present

## 2016-12-30 DIAGNOSIS — I495 Sick sinus syndrome: Secondary | ICD-10-CM | POA: Diagnosis not present

## 2016-12-30 DIAGNOSIS — N183 Chronic kidney disease, stage 3 (moderate): Secondary | ICD-10-CM | POA: Diagnosis not present

## 2016-12-30 DIAGNOSIS — J449 Chronic obstructive pulmonary disease, unspecified: Secondary | ICD-10-CM | POA: Diagnosis not present

## 2016-12-30 DIAGNOSIS — I25810 Atherosclerosis of coronary artery bypass graft(s) without angina pectoris: Secondary | ICD-10-CM | POA: Diagnosis not present

## 2016-12-30 DIAGNOSIS — I739 Peripheral vascular disease, unspecified: Secondary | ICD-10-CM | POA: Diagnosis not present

## 2016-12-30 DIAGNOSIS — I5030 Unspecified diastolic (congestive) heart failure: Secondary | ICD-10-CM | POA: Diagnosis not present

## 2017-01-01 DIAGNOSIS — J449 Chronic obstructive pulmonary disease, unspecified: Secondary | ICD-10-CM | POA: Diagnosis not present

## 2017-01-01 DIAGNOSIS — N183 Chronic kidney disease, stage 3 (moderate): Secondary | ICD-10-CM | POA: Diagnosis not present

## 2017-01-01 DIAGNOSIS — I5030 Unspecified diastolic (congestive) heart failure: Secondary | ICD-10-CM | POA: Diagnosis not present

## 2017-01-01 DIAGNOSIS — I495 Sick sinus syndrome: Secondary | ICD-10-CM | POA: Diagnosis not present

## 2017-01-01 DIAGNOSIS — I739 Peripheral vascular disease, unspecified: Secondary | ICD-10-CM | POA: Diagnosis not present

## 2017-01-01 DIAGNOSIS — I25810 Atherosclerosis of coronary artery bypass graft(s) without angina pectoris: Secondary | ICD-10-CM | POA: Diagnosis not present

## 2017-01-04 DIAGNOSIS — I5030 Unspecified diastolic (congestive) heart failure: Secondary | ICD-10-CM | POA: Diagnosis not present

## 2017-01-04 DIAGNOSIS — I495 Sick sinus syndrome: Secondary | ICD-10-CM | POA: Diagnosis not present

## 2017-01-04 DIAGNOSIS — I739 Peripheral vascular disease, unspecified: Secondary | ICD-10-CM | POA: Diagnosis not present

## 2017-01-04 DIAGNOSIS — N183 Chronic kidney disease, stage 3 (moderate): Secondary | ICD-10-CM | POA: Diagnosis not present

## 2017-01-04 DIAGNOSIS — I25810 Atherosclerosis of coronary artery bypass graft(s) without angina pectoris: Secondary | ICD-10-CM | POA: Diagnosis not present

## 2017-01-04 DIAGNOSIS — J449 Chronic obstructive pulmonary disease, unspecified: Secondary | ICD-10-CM | POA: Diagnosis not present

## 2017-01-05 ENCOUNTER — Other Ambulatory Visit: Payer: Self-pay | Admitting: Internal Medicine

## 2017-01-05 DIAGNOSIS — I495 Sick sinus syndrome: Secondary | ICD-10-CM | POA: Diagnosis not present

## 2017-01-05 DIAGNOSIS — N183 Chronic kidney disease, stage 3 (moderate): Secondary | ICD-10-CM | POA: Diagnosis not present

## 2017-01-05 DIAGNOSIS — I5030 Unspecified diastolic (congestive) heart failure: Secondary | ICD-10-CM | POA: Diagnosis not present

## 2017-01-05 DIAGNOSIS — J449 Chronic obstructive pulmonary disease, unspecified: Secondary | ICD-10-CM | POA: Diagnosis not present

## 2017-01-05 DIAGNOSIS — I739 Peripheral vascular disease, unspecified: Secondary | ICD-10-CM | POA: Diagnosis not present

## 2017-01-05 DIAGNOSIS — I25810 Atherosclerosis of coronary artery bypass graft(s) without angina pectoris: Secondary | ICD-10-CM | POA: Diagnosis not present

## 2017-01-05 MED ORDER — HYDROCODONE-ACETAMINOPHEN 7.5-325 MG PO TABS: 1.0000 | ORAL_TABLET | Freq: Four times a day (QID) | ORAL | 0 refills | Status: DC | PRN

## 2017-01-05 MED ORDER — FUROSEMIDE 40 MG PO TABS: 80.0000 mg | ORAL_TABLET | Freq: Two times a day (BID) | ORAL | 5 refills | Status: DC

## 2017-01-05 NOTE — Telephone Encounter (Signed)
Requesting assistance with refills.  Patient takes hydrocodone.  Requesting refill of a quantity 60 for 15 days at a time.  Patient uses [facility].  Patient is hospice patient at home.  Requesting script to be faxed to pharmacy stating hospice patient.  Patient has one more refill on furosemide.  Requesting another refill to send in.  Requesting quanity of 60 for 15 days at a time with refills.  States hospice will only honor 15 days scripts at a time.    Looks like Hydrocodone was a historical provider and furosemide was Dr. Posey Pronto.  Is this something our office can do under hospice care orders?

## 2017-01-05 NOTE — Telephone Encounter (Signed)
[facility] reviewed. Patient has not had prescription for hydrocodone-acetaminophen since June 2017. Will provide 1 prescription for hospice at this time. Refills for lasix provided.

## 2017-01-05 NOTE — Telephone Encounter (Signed)
Please review

## 2017-01-05 NOTE — Telephone Encounter (Signed)
Notified Maura refills has been faxed to [facility]...Johny Chess

## 2017-01-06 DIAGNOSIS — E785 Hyperlipidemia, unspecified: Secondary | ICD-10-CM | POA: Diagnosis not present

## 2017-01-06 DIAGNOSIS — I5030 Unspecified diastolic (congestive) heart failure: Secondary | ICD-10-CM | POA: Diagnosis not present

## 2017-01-06 DIAGNOSIS — C9290 Myeloid leukemia, unspecified, not having achieved remission: Secondary | ICD-10-CM | POA: Diagnosis not present

## 2017-01-06 DIAGNOSIS — K219 Gastro-esophageal reflux disease without esophagitis: Secondary | ICD-10-CM | POA: Diagnosis not present

## 2017-01-06 DIAGNOSIS — I739 Peripheral vascular disease, unspecified: Secondary | ICD-10-CM | POA: Diagnosis not present

## 2017-01-06 DIAGNOSIS — N183 Chronic kidney disease, stage 3 (moderate): Secondary | ICD-10-CM | POA: Diagnosis not present

## 2017-01-06 DIAGNOSIS — C61 Malignant neoplasm of prostate: Secondary | ICD-10-CM | POA: Diagnosis not present

## 2017-01-06 DIAGNOSIS — I25810 Atherosclerosis of coronary artery bypass graft(s) without angina pectoris: Secondary | ICD-10-CM | POA: Diagnosis not present

## 2017-01-06 DIAGNOSIS — J309 Allergic rhinitis, unspecified: Secondary | ICD-10-CM | POA: Diagnosis not present

## 2017-01-06 DIAGNOSIS — E1159 Type 2 diabetes mellitus with other circulatory complications: Secondary | ICD-10-CM | POA: Diagnosis not present

## 2017-01-06 DIAGNOSIS — H11149 Conjunctival xerosis, unspecified, unspecified eye: Secondary | ICD-10-CM | POA: Diagnosis not present

## 2017-01-06 DIAGNOSIS — I495 Sick sinus syndrome: Secondary | ICD-10-CM | POA: Diagnosis not present

## 2017-01-06 DIAGNOSIS — R0902 Hypoxemia: Secondary | ICD-10-CM | POA: Diagnosis not present

## 2017-01-06 DIAGNOSIS — I10 Essential (primary) hypertension: Secondary | ICD-10-CM | POA: Diagnosis not present

## 2017-01-06 DIAGNOSIS — J449 Chronic obstructive pulmonary disease, unspecified: Secondary | ICD-10-CM | POA: Diagnosis not present

## 2017-01-08 DIAGNOSIS — I739 Peripheral vascular disease, unspecified: Secondary | ICD-10-CM | POA: Diagnosis not present

## 2017-01-08 DIAGNOSIS — I5030 Unspecified diastolic (congestive) heart failure: Secondary | ICD-10-CM | POA: Diagnosis not present

## 2017-01-08 DIAGNOSIS — I495 Sick sinus syndrome: Secondary | ICD-10-CM | POA: Diagnosis not present

## 2017-01-08 DIAGNOSIS — I25810 Atherosclerosis of coronary artery bypass graft(s) without angina pectoris: Secondary | ICD-10-CM | POA: Diagnosis not present

## 2017-01-08 DIAGNOSIS — N183 Chronic kidney disease, stage 3 (moderate): Secondary | ICD-10-CM | POA: Diagnosis not present

## 2017-01-08 DIAGNOSIS — J449 Chronic obstructive pulmonary disease, unspecified: Secondary | ICD-10-CM | POA: Diagnosis not present

## 2017-01-12 DIAGNOSIS — N183 Chronic kidney disease, stage 3 (moderate): Secondary | ICD-10-CM | POA: Diagnosis not present

## 2017-01-12 DIAGNOSIS — I5030 Unspecified diastolic (congestive) heart failure: Secondary | ICD-10-CM | POA: Diagnosis not present

## 2017-01-12 DIAGNOSIS — I25810 Atherosclerosis of coronary artery bypass graft(s) without angina pectoris: Secondary | ICD-10-CM | POA: Diagnosis not present

## 2017-01-12 DIAGNOSIS — I495 Sick sinus syndrome: Secondary | ICD-10-CM | POA: Diagnosis not present

## 2017-01-12 DIAGNOSIS — J449 Chronic obstructive pulmonary disease, unspecified: Secondary | ICD-10-CM | POA: Diagnosis not present

## 2017-01-12 DIAGNOSIS — I739 Peripheral vascular disease, unspecified: Secondary | ICD-10-CM | POA: Diagnosis not present

## 2017-01-13 DIAGNOSIS — I739 Peripheral vascular disease, unspecified: Secondary | ICD-10-CM | POA: Diagnosis not present

## 2017-01-13 DIAGNOSIS — I5030 Unspecified diastolic (congestive) heart failure: Secondary | ICD-10-CM | POA: Diagnosis not present

## 2017-01-13 DIAGNOSIS — J449 Chronic obstructive pulmonary disease, unspecified: Secondary | ICD-10-CM | POA: Diagnosis not present

## 2017-01-13 DIAGNOSIS — I25810 Atherosclerosis of coronary artery bypass graft(s) without angina pectoris: Secondary | ICD-10-CM | POA: Diagnosis not present

## 2017-01-13 DIAGNOSIS — N183 Chronic kidney disease, stage 3 (moderate): Secondary | ICD-10-CM | POA: Diagnosis not present

## 2017-01-13 DIAGNOSIS — I495 Sick sinus syndrome: Secondary | ICD-10-CM | POA: Diagnosis not present

## 2017-01-14 DIAGNOSIS — I5030 Unspecified diastolic (congestive) heart failure: Secondary | ICD-10-CM | POA: Diagnosis not present

## 2017-01-14 DIAGNOSIS — I739 Peripheral vascular disease, unspecified: Secondary | ICD-10-CM | POA: Diagnosis not present

## 2017-01-14 DIAGNOSIS — J449 Chronic obstructive pulmonary disease, unspecified: Secondary | ICD-10-CM | POA: Diagnosis not present

## 2017-01-14 DIAGNOSIS — I25810 Atherosclerosis of coronary artery bypass graft(s) without angina pectoris: Secondary | ICD-10-CM | POA: Diagnosis not present

## 2017-01-14 DIAGNOSIS — N183 Chronic kidney disease, stage 3 (moderate): Secondary | ICD-10-CM | POA: Diagnosis not present

## 2017-01-14 DIAGNOSIS — I495 Sick sinus syndrome: Secondary | ICD-10-CM | POA: Diagnosis not present

## 2017-01-15 DIAGNOSIS — N183 Chronic kidney disease, stage 3 (moderate): Secondary | ICD-10-CM | POA: Diagnosis not present

## 2017-01-15 DIAGNOSIS — I5030 Unspecified diastolic (congestive) heart failure: Secondary | ICD-10-CM | POA: Diagnosis not present

## 2017-01-15 DIAGNOSIS — I25810 Atherosclerosis of coronary artery bypass graft(s) without angina pectoris: Secondary | ICD-10-CM | POA: Diagnosis not present

## 2017-01-15 DIAGNOSIS — I739 Peripheral vascular disease, unspecified: Secondary | ICD-10-CM | POA: Diagnosis not present

## 2017-01-15 DIAGNOSIS — I495 Sick sinus syndrome: Secondary | ICD-10-CM | POA: Diagnosis not present

## 2017-01-15 DIAGNOSIS — J449 Chronic obstructive pulmonary disease, unspecified: Secondary | ICD-10-CM | POA: Diagnosis not present

## 2017-01-18 DIAGNOSIS — J449 Chronic obstructive pulmonary disease, unspecified: Secondary | ICD-10-CM | POA: Diagnosis not present

## 2017-01-18 DIAGNOSIS — I739 Peripheral vascular disease, unspecified: Secondary | ICD-10-CM | POA: Diagnosis not present

## 2017-01-18 DIAGNOSIS — I25810 Atherosclerosis of coronary artery bypass graft(s) without angina pectoris: Secondary | ICD-10-CM | POA: Diagnosis not present

## 2017-01-18 DIAGNOSIS — N183 Chronic kidney disease, stage 3 (moderate): Secondary | ICD-10-CM | POA: Diagnosis not present

## 2017-01-18 DIAGNOSIS — I5030 Unspecified diastolic (congestive) heart failure: Secondary | ICD-10-CM | POA: Diagnosis not present

## 2017-01-18 DIAGNOSIS — I495 Sick sinus syndrome: Secondary | ICD-10-CM | POA: Diagnosis not present

## 2017-01-19 DIAGNOSIS — I739 Peripheral vascular disease, unspecified: Secondary | ICD-10-CM | POA: Diagnosis not present

## 2017-01-19 DIAGNOSIS — I495 Sick sinus syndrome: Secondary | ICD-10-CM | POA: Diagnosis not present

## 2017-01-19 DIAGNOSIS — N183 Chronic kidney disease, stage 3 (moderate): Secondary | ICD-10-CM | POA: Diagnosis not present

## 2017-01-19 DIAGNOSIS — I5030 Unspecified diastolic (congestive) heart failure: Secondary | ICD-10-CM | POA: Diagnosis not present

## 2017-01-19 DIAGNOSIS — I25810 Atherosclerosis of coronary artery bypass graft(s) without angina pectoris: Secondary | ICD-10-CM | POA: Diagnosis not present

## 2017-01-19 DIAGNOSIS — J449 Chronic obstructive pulmonary disease, unspecified: Secondary | ICD-10-CM | POA: Diagnosis not present

## 2017-01-20 DIAGNOSIS — J449 Chronic obstructive pulmonary disease, unspecified: Secondary | ICD-10-CM | POA: Diagnosis not present

## 2017-01-20 DIAGNOSIS — I25810 Atherosclerosis of coronary artery bypass graft(s) without angina pectoris: Secondary | ICD-10-CM | POA: Diagnosis not present

## 2017-01-20 DIAGNOSIS — I495 Sick sinus syndrome: Secondary | ICD-10-CM | POA: Diagnosis not present

## 2017-01-20 DIAGNOSIS — N183 Chronic kidney disease, stage 3 (moderate): Secondary | ICD-10-CM | POA: Diagnosis not present

## 2017-01-20 DIAGNOSIS — I739 Peripheral vascular disease, unspecified: Secondary | ICD-10-CM | POA: Diagnosis not present

## 2017-01-20 DIAGNOSIS — I5030 Unspecified diastolic (congestive) heart failure: Secondary | ICD-10-CM | POA: Diagnosis not present

## 2017-01-22 DIAGNOSIS — J449 Chronic obstructive pulmonary disease, unspecified: Secondary | ICD-10-CM | POA: Diagnosis not present

## 2017-01-22 DIAGNOSIS — I495 Sick sinus syndrome: Secondary | ICD-10-CM | POA: Diagnosis not present

## 2017-01-22 DIAGNOSIS — I5030 Unspecified diastolic (congestive) heart failure: Secondary | ICD-10-CM | POA: Diagnosis not present

## 2017-01-22 DIAGNOSIS — I25810 Atherosclerosis of coronary artery bypass graft(s) without angina pectoris: Secondary | ICD-10-CM | POA: Diagnosis not present

## 2017-01-22 DIAGNOSIS — I739 Peripheral vascular disease, unspecified: Secondary | ICD-10-CM | POA: Diagnosis not present

## 2017-01-22 DIAGNOSIS — N183 Chronic kidney disease, stage 3 (moderate): Secondary | ICD-10-CM | POA: Diagnosis not present

## 2017-01-25 DIAGNOSIS — J449 Chronic obstructive pulmonary disease, unspecified: Secondary | ICD-10-CM | POA: Diagnosis not present

## 2017-01-25 DIAGNOSIS — I25810 Atherosclerosis of coronary artery bypass graft(s) without angina pectoris: Secondary | ICD-10-CM | POA: Diagnosis not present

## 2017-01-25 DIAGNOSIS — I739 Peripheral vascular disease, unspecified: Secondary | ICD-10-CM | POA: Diagnosis not present

## 2017-01-25 DIAGNOSIS — I5030 Unspecified diastolic (congestive) heart failure: Secondary | ICD-10-CM | POA: Diagnosis not present

## 2017-01-25 DIAGNOSIS — N183 Chronic kidney disease, stage 3 (moderate): Secondary | ICD-10-CM | POA: Diagnosis not present

## 2017-01-25 DIAGNOSIS — I495 Sick sinus syndrome: Secondary | ICD-10-CM | POA: Diagnosis not present

## 2017-01-26 DIAGNOSIS — I5030 Unspecified diastolic (congestive) heart failure: Secondary | ICD-10-CM | POA: Diagnosis not present

## 2017-01-26 DIAGNOSIS — I739 Peripheral vascular disease, unspecified: Secondary | ICD-10-CM | POA: Diagnosis not present

## 2017-01-26 DIAGNOSIS — I495 Sick sinus syndrome: Secondary | ICD-10-CM | POA: Diagnosis not present

## 2017-01-26 DIAGNOSIS — I25810 Atherosclerosis of coronary artery bypass graft(s) without angina pectoris: Secondary | ICD-10-CM | POA: Diagnosis not present

## 2017-01-26 DIAGNOSIS — N183 Chronic kidney disease, stage 3 (moderate): Secondary | ICD-10-CM | POA: Diagnosis not present

## 2017-01-26 DIAGNOSIS — J449 Chronic obstructive pulmonary disease, unspecified: Secondary | ICD-10-CM | POA: Diagnosis not present

## 2017-01-27 DIAGNOSIS — I739 Peripheral vascular disease, unspecified: Secondary | ICD-10-CM | POA: Diagnosis not present

## 2017-01-27 DIAGNOSIS — I495 Sick sinus syndrome: Secondary | ICD-10-CM | POA: Diagnosis not present

## 2017-01-27 DIAGNOSIS — I5030 Unspecified diastolic (congestive) heart failure: Secondary | ICD-10-CM | POA: Diagnosis not present

## 2017-01-27 DIAGNOSIS — I25810 Atherosclerosis of coronary artery bypass graft(s) without angina pectoris: Secondary | ICD-10-CM | POA: Diagnosis not present

## 2017-01-27 DIAGNOSIS — N183 Chronic kidney disease, stage 3 (moderate): Secondary | ICD-10-CM | POA: Diagnosis not present

## 2017-01-27 DIAGNOSIS — J449 Chronic obstructive pulmonary disease, unspecified: Secondary | ICD-10-CM | POA: Diagnosis not present

## 2017-01-29 DIAGNOSIS — I5030 Unspecified diastolic (congestive) heart failure: Secondary | ICD-10-CM | POA: Diagnosis not present

## 2017-01-29 DIAGNOSIS — N183 Chronic kidney disease, stage 3 (moderate): Secondary | ICD-10-CM | POA: Diagnosis not present

## 2017-01-29 DIAGNOSIS — I739 Peripheral vascular disease, unspecified: Secondary | ICD-10-CM | POA: Diagnosis not present

## 2017-01-29 DIAGNOSIS — I25810 Atherosclerosis of coronary artery bypass graft(s) without angina pectoris: Secondary | ICD-10-CM | POA: Diagnosis not present

## 2017-01-29 DIAGNOSIS — I495 Sick sinus syndrome: Secondary | ICD-10-CM | POA: Diagnosis not present

## 2017-01-29 DIAGNOSIS — J449 Chronic obstructive pulmonary disease, unspecified: Secondary | ICD-10-CM | POA: Diagnosis not present

## 2017-02-01 DIAGNOSIS — J449 Chronic obstructive pulmonary disease, unspecified: Secondary | ICD-10-CM | POA: Diagnosis not present

## 2017-02-01 DIAGNOSIS — I495 Sick sinus syndrome: Secondary | ICD-10-CM | POA: Diagnosis not present

## 2017-02-01 DIAGNOSIS — I739 Peripheral vascular disease, unspecified: Secondary | ICD-10-CM | POA: Diagnosis not present

## 2017-02-01 DIAGNOSIS — N183 Chronic kidney disease, stage 3 (moderate): Secondary | ICD-10-CM | POA: Diagnosis not present

## 2017-02-01 DIAGNOSIS — I5030 Unspecified diastolic (congestive) heart failure: Secondary | ICD-10-CM | POA: Diagnosis not present

## 2017-02-01 DIAGNOSIS — I25810 Atherosclerosis of coronary artery bypass graft(s) without angina pectoris: Secondary | ICD-10-CM | POA: Diagnosis not present

## 2017-02-02 DIAGNOSIS — I25810 Atherosclerosis of coronary artery bypass graft(s) without angina pectoris: Secondary | ICD-10-CM | POA: Diagnosis not present

## 2017-02-02 DIAGNOSIS — I739 Peripheral vascular disease, unspecified: Secondary | ICD-10-CM | POA: Diagnosis not present

## 2017-02-02 DIAGNOSIS — I5030 Unspecified diastolic (congestive) heart failure: Secondary | ICD-10-CM | POA: Diagnosis not present

## 2017-02-02 DIAGNOSIS — I495 Sick sinus syndrome: Secondary | ICD-10-CM | POA: Diagnosis not present

## 2017-02-02 DIAGNOSIS — J449 Chronic obstructive pulmonary disease, unspecified: Secondary | ICD-10-CM | POA: Diagnosis not present

## 2017-02-02 DIAGNOSIS — N183 Chronic kidney disease, stage 3 (moderate): Secondary | ICD-10-CM | POA: Diagnosis not present

## 2017-02-03 DIAGNOSIS — N183 Chronic kidney disease, stage 3 (moderate): Secondary | ICD-10-CM | POA: Diagnosis not present

## 2017-02-03 DIAGNOSIS — I25810 Atherosclerosis of coronary artery bypass graft(s) without angina pectoris: Secondary | ICD-10-CM | POA: Diagnosis not present

## 2017-02-03 DIAGNOSIS — I739 Peripheral vascular disease, unspecified: Secondary | ICD-10-CM | POA: Diagnosis not present

## 2017-02-03 DIAGNOSIS — I5030 Unspecified diastolic (congestive) heart failure: Secondary | ICD-10-CM | POA: Diagnosis not present

## 2017-02-03 DIAGNOSIS — J449 Chronic obstructive pulmonary disease, unspecified: Secondary | ICD-10-CM | POA: Diagnosis not present

## 2017-02-03 DIAGNOSIS — I495 Sick sinus syndrome: Secondary | ICD-10-CM | POA: Diagnosis not present

## 2017-02-05 DIAGNOSIS — J449 Chronic obstructive pulmonary disease, unspecified: Secondary | ICD-10-CM | POA: Diagnosis not present

## 2017-02-05 DIAGNOSIS — I25810 Atherosclerosis of coronary artery bypass graft(s) without angina pectoris: Secondary | ICD-10-CM | POA: Diagnosis not present

## 2017-02-05 DIAGNOSIS — N183 Chronic kidney disease, stage 3 (moderate): Secondary | ICD-10-CM | POA: Diagnosis not present

## 2017-02-05 DIAGNOSIS — I495 Sick sinus syndrome: Secondary | ICD-10-CM | POA: Diagnosis not present

## 2017-02-05 DIAGNOSIS — I5030 Unspecified diastolic (congestive) heart failure: Secondary | ICD-10-CM | POA: Diagnosis not present

## 2017-02-05 DIAGNOSIS — I739 Peripheral vascular disease, unspecified: Secondary | ICD-10-CM | POA: Diagnosis not present

## 2017-02-06 DIAGNOSIS — I5030 Unspecified diastolic (congestive) heart failure: Secondary | ICD-10-CM | POA: Diagnosis not present

## 2017-02-06 DIAGNOSIS — N183 Chronic kidney disease, stage 3 (moderate): Secondary | ICD-10-CM | POA: Diagnosis not present

## 2017-02-06 DIAGNOSIS — H11149 Conjunctival xerosis, unspecified, unspecified eye: Secondary | ICD-10-CM | POA: Diagnosis not present

## 2017-02-06 DIAGNOSIS — C61 Malignant neoplasm of prostate: Secondary | ICD-10-CM | POA: Diagnosis not present

## 2017-02-06 DIAGNOSIS — I25810 Atherosclerosis of coronary artery bypass graft(s) without angina pectoris: Secondary | ICD-10-CM | POA: Diagnosis not present

## 2017-02-06 DIAGNOSIS — I739 Peripheral vascular disease, unspecified: Secondary | ICD-10-CM | POA: Diagnosis not present

## 2017-02-06 DIAGNOSIS — K219 Gastro-esophageal reflux disease without esophagitis: Secondary | ICD-10-CM | POA: Diagnosis not present

## 2017-02-06 DIAGNOSIS — I495 Sick sinus syndrome: Secondary | ICD-10-CM | POA: Diagnosis not present

## 2017-02-06 DIAGNOSIS — E1159 Type 2 diabetes mellitus with other circulatory complications: Secondary | ICD-10-CM | POA: Diagnosis not present

## 2017-02-06 DIAGNOSIS — I10 Essential (primary) hypertension: Secondary | ICD-10-CM | POA: Diagnosis not present

## 2017-02-06 DIAGNOSIS — R0902 Hypoxemia: Secondary | ICD-10-CM | POA: Diagnosis not present

## 2017-02-06 DIAGNOSIS — J309 Allergic rhinitis, unspecified: Secondary | ICD-10-CM | POA: Diagnosis not present

## 2017-02-06 DIAGNOSIS — J449 Chronic obstructive pulmonary disease, unspecified: Secondary | ICD-10-CM | POA: Diagnosis not present

## 2017-02-06 DIAGNOSIS — E785 Hyperlipidemia, unspecified: Secondary | ICD-10-CM | POA: Diagnosis not present

## 2017-02-06 DIAGNOSIS — C9290 Myeloid leukemia, unspecified, not having achieved remission: Secondary | ICD-10-CM | POA: Diagnosis not present

## 2017-02-10 DIAGNOSIS — I739 Peripheral vascular disease, unspecified: Secondary | ICD-10-CM | POA: Diagnosis not present

## 2017-02-10 DIAGNOSIS — N183 Chronic kidney disease, stage 3 (moderate): Secondary | ICD-10-CM | POA: Diagnosis not present

## 2017-02-10 DIAGNOSIS — I5030 Unspecified diastolic (congestive) heart failure: Secondary | ICD-10-CM | POA: Diagnosis not present

## 2017-02-10 DIAGNOSIS — I495 Sick sinus syndrome: Secondary | ICD-10-CM | POA: Diagnosis not present

## 2017-02-10 DIAGNOSIS — I25810 Atherosclerosis of coronary artery bypass graft(s) without angina pectoris: Secondary | ICD-10-CM | POA: Diagnosis not present

## 2017-02-10 DIAGNOSIS — J449 Chronic obstructive pulmonary disease, unspecified: Secondary | ICD-10-CM | POA: Diagnosis not present

## 2017-02-11 DIAGNOSIS — H5713 Ocular pain, bilateral: Secondary | ICD-10-CM | POA: Diagnosis not present

## 2017-02-11 DIAGNOSIS — H01001 Unspecified blepharitis right upper eyelid: Secondary | ICD-10-CM | POA: Diagnosis not present

## 2017-02-11 DIAGNOSIS — H02054 Trichiasis without entropian left upper eyelid: Secondary | ICD-10-CM | POA: Diagnosis not present

## 2017-02-11 DIAGNOSIS — H02051 Trichiasis without entropian right upper eyelid: Secondary | ICD-10-CM | POA: Diagnosis not present

## 2017-02-12 DIAGNOSIS — I25810 Atherosclerosis of coronary artery bypass graft(s) without angina pectoris: Secondary | ICD-10-CM | POA: Diagnosis not present

## 2017-02-12 DIAGNOSIS — I5030 Unspecified diastolic (congestive) heart failure: Secondary | ICD-10-CM | POA: Diagnosis not present

## 2017-02-12 DIAGNOSIS — I739 Peripheral vascular disease, unspecified: Secondary | ICD-10-CM | POA: Diagnosis not present

## 2017-02-12 DIAGNOSIS — J449 Chronic obstructive pulmonary disease, unspecified: Secondary | ICD-10-CM | POA: Diagnosis not present

## 2017-02-12 DIAGNOSIS — N183 Chronic kidney disease, stage 3 (moderate): Secondary | ICD-10-CM | POA: Diagnosis not present

## 2017-02-12 DIAGNOSIS — I495 Sick sinus syndrome: Secondary | ICD-10-CM | POA: Diagnosis not present

## 2017-02-15 DIAGNOSIS — I739 Peripheral vascular disease, unspecified: Secondary | ICD-10-CM | POA: Diagnosis not present

## 2017-02-15 DIAGNOSIS — I495 Sick sinus syndrome: Secondary | ICD-10-CM | POA: Diagnosis not present

## 2017-02-15 DIAGNOSIS — I25810 Atherosclerosis of coronary artery bypass graft(s) without angina pectoris: Secondary | ICD-10-CM | POA: Diagnosis not present

## 2017-02-15 DIAGNOSIS — I5030 Unspecified diastolic (congestive) heart failure: Secondary | ICD-10-CM | POA: Diagnosis not present

## 2017-02-15 DIAGNOSIS — N183 Chronic kidney disease, stage 3 (moderate): Secondary | ICD-10-CM | POA: Diagnosis not present

## 2017-02-15 DIAGNOSIS — J449 Chronic obstructive pulmonary disease, unspecified: Secondary | ICD-10-CM | POA: Diagnosis not present

## 2017-02-16 DIAGNOSIS — I5030 Unspecified diastolic (congestive) heart failure: Secondary | ICD-10-CM | POA: Diagnosis not present

## 2017-02-16 DIAGNOSIS — I495 Sick sinus syndrome: Secondary | ICD-10-CM | POA: Diagnosis not present

## 2017-02-16 DIAGNOSIS — N183 Chronic kidney disease, stage 3 (moderate): Secondary | ICD-10-CM | POA: Diagnosis not present

## 2017-02-16 DIAGNOSIS — I739 Peripheral vascular disease, unspecified: Secondary | ICD-10-CM | POA: Diagnosis not present

## 2017-02-16 DIAGNOSIS — J449 Chronic obstructive pulmonary disease, unspecified: Secondary | ICD-10-CM | POA: Diagnosis not present

## 2017-02-16 DIAGNOSIS — I25810 Atherosclerosis of coronary artery bypass graft(s) without angina pectoris: Secondary | ICD-10-CM | POA: Diagnosis not present

## 2017-02-19 DIAGNOSIS — I739 Peripheral vascular disease, unspecified: Secondary | ICD-10-CM | POA: Diagnosis not present

## 2017-02-19 DIAGNOSIS — I495 Sick sinus syndrome: Secondary | ICD-10-CM | POA: Diagnosis not present

## 2017-02-19 DIAGNOSIS — J449 Chronic obstructive pulmonary disease, unspecified: Secondary | ICD-10-CM | POA: Diagnosis not present

## 2017-02-19 DIAGNOSIS — N183 Chronic kidney disease, stage 3 (moderate): Secondary | ICD-10-CM | POA: Diagnosis not present

## 2017-02-19 DIAGNOSIS — I5030 Unspecified diastolic (congestive) heart failure: Secondary | ICD-10-CM | POA: Diagnosis not present

## 2017-02-19 DIAGNOSIS — I25810 Atherosclerosis of coronary artery bypass graft(s) without angina pectoris: Secondary | ICD-10-CM | POA: Diagnosis not present

## 2017-02-22 ENCOUNTER — Encounter: Payer: Self-pay | Admitting: Internal Medicine

## 2017-02-22 ENCOUNTER — Telehealth: Payer: Self-pay | Admitting: Internal Medicine

## 2017-02-22 DIAGNOSIS — I25810 Atherosclerosis of coronary artery bypass graft(s) without angina pectoris: Secondary | ICD-10-CM | POA: Diagnosis not present

## 2017-02-22 DIAGNOSIS — N183 Chronic kidney disease, stage 3 (moderate): Secondary | ICD-10-CM | POA: Diagnosis not present

## 2017-02-22 DIAGNOSIS — I5030 Unspecified diastolic (congestive) heart failure: Secondary | ICD-10-CM | POA: Diagnosis not present

## 2017-02-22 DIAGNOSIS — I495 Sick sinus syndrome: Secondary | ICD-10-CM | POA: Diagnosis not present

## 2017-02-22 DIAGNOSIS — I739 Peripheral vascular disease, unspecified: Secondary | ICD-10-CM | POA: Diagnosis not present

## 2017-02-22 DIAGNOSIS — J449 Chronic obstructive pulmonary disease, unspecified: Secondary | ICD-10-CM | POA: Diagnosis not present

## 2017-02-22 DIAGNOSIS — Z950 Presence of cardiac pacemaker: Secondary | ICD-10-CM | POA: Diagnosis not present

## 2017-02-22 NOTE — Telephone Encounter (Signed)
° °  New Message   pt verbalized that he is calling for rn  Because another doctor told him that something is going on and that he needs to call his heart doctor  I asked pt if he is sob he said yes I have COPD  I asked any chest pain, no A-fib he said no

## 2017-02-22 NOTE — Telephone Encounter (Signed)
I called and spoke with the patient. He states that he had someone from a company in "[place]" call him today and state that something was not right with his device as his low rate is set at 60 bpm and he is running in the 50's. He was told to call the office to follow up. I advised the patient I did not see any documentation of there being any information come in on his device.  He did get upset with me asking him questions to clarify what is going on. I apologized and advised with me not seeing any information in his chart, I would need to ask questions to further verify what is going on. Per his report, someone from the device company is supposed to be sending in information on his device. I advised I will need to send this to the device clinic to verify if anything is going on with his device or if any information is received.  He does not feel he can wait until 03/22/17 to see the device clinic.  I advised I will have someone from [facility] all him back tomorrow to follow up and see how soon his appointment needs to be moved up. He was agreeable.

## 2017-02-23 NOTE — Telephone Encounter (Signed)
LVM for pt to call device clinic.

## 2017-02-23 NOTE — Telephone Encounter (Signed)
Spoke with pt and informed him that the company that does the telephone transmission would fax something over if there was a problem, pt stated that he just completed the telephone transmission yesterday, informed pt that we could move up his in clinic appointment, pt stated that he would like to have it moved up, apt made for 9/27 at 3:30pm, informed pt that I would give him a call back if the appointment needed to be moved up after receiving the TTM report. Pt voiced understanding,

## 2017-02-24 DIAGNOSIS — J449 Chronic obstructive pulmonary disease, unspecified: Secondary | ICD-10-CM | POA: Diagnosis not present

## 2017-02-24 DIAGNOSIS — I25810 Atherosclerosis of coronary artery bypass graft(s) without angina pectoris: Secondary | ICD-10-CM | POA: Diagnosis not present

## 2017-02-24 DIAGNOSIS — I495 Sick sinus syndrome: Secondary | ICD-10-CM | POA: Diagnosis not present

## 2017-02-24 DIAGNOSIS — N183 Chronic kidney disease, stage 3 (moderate): Secondary | ICD-10-CM | POA: Diagnosis not present

## 2017-02-24 DIAGNOSIS — I5030 Unspecified diastolic (congestive) heart failure: Secondary | ICD-10-CM | POA: Diagnosis not present

## 2017-02-24 DIAGNOSIS — I739 Peripheral vascular disease, unspecified: Secondary | ICD-10-CM | POA: Diagnosis not present

## 2017-02-25 DIAGNOSIS — N183 Chronic kidney disease, stage 3 (moderate): Secondary | ICD-10-CM | POA: Diagnosis not present

## 2017-02-25 DIAGNOSIS — I739 Peripheral vascular disease, unspecified: Secondary | ICD-10-CM | POA: Diagnosis not present

## 2017-02-25 DIAGNOSIS — J449 Chronic obstructive pulmonary disease, unspecified: Secondary | ICD-10-CM | POA: Diagnosis not present

## 2017-02-25 DIAGNOSIS — I5030 Unspecified diastolic (congestive) heart failure: Secondary | ICD-10-CM | POA: Diagnosis not present

## 2017-02-25 DIAGNOSIS — I25810 Atherosclerosis of coronary artery bypass graft(s) without angina pectoris: Secondary | ICD-10-CM | POA: Diagnosis not present

## 2017-02-25 DIAGNOSIS — I495 Sick sinus syndrome: Secondary | ICD-10-CM | POA: Diagnosis not present

## 2017-02-26 DIAGNOSIS — J449 Chronic obstructive pulmonary disease, unspecified: Secondary | ICD-10-CM | POA: Diagnosis not present

## 2017-02-26 DIAGNOSIS — N183 Chronic kidney disease, stage 3 (moderate): Secondary | ICD-10-CM | POA: Diagnosis not present

## 2017-02-26 DIAGNOSIS — I5030 Unspecified diastolic (congestive) heart failure: Secondary | ICD-10-CM | POA: Diagnosis not present

## 2017-02-26 DIAGNOSIS — I25810 Atherosclerosis of coronary artery bypass graft(s) without angina pectoris: Secondary | ICD-10-CM | POA: Diagnosis not present

## 2017-02-26 DIAGNOSIS — I739 Peripheral vascular disease, unspecified: Secondary | ICD-10-CM | POA: Diagnosis not present

## 2017-02-26 DIAGNOSIS — I495 Sick sinus syndrome: Secondary | ICD-10-CM | POA: Diagnosis not present

## 2017-03-01 DIAGNOSIS — I495 Sick sinus syndrome: Secondary | ICD-10-CM | POA: Diagnosis not present

## 2017-03-01 DIAGNOSIS — I739 Peripheral vascular disease, unspecified: Secondary | ICD-10-CM | POA: Diagnosis not present

## 2017-03-01 DIAGNOSIS — J449 Chronic obstructive pulmonary disease, unspecified: Secondary | ICD-10-CM | POA: Diagnosis not present

## 2017-03-01 DIAGNOSIS — I5030 Unspecified diastolic (congestive) heart failure: Secondary | ICD-10-CM | POA: Diagnosis not present

## 2017-03-01 DIAGNOSIS — N183 Chronic kidney disease, stage 3 (moderate): Secondary | ICD-10-CM | POA: Diagnosis not present

## 2017-03-01 DIAGNOSIS — I25810 Atherosclerosis of coronary artery bypass graft(s) without angina pectoris: Secondary | ICD-10-CM | POA: Diagnosis not present

## 2017-03-02 ENCOUNTER — Telehealth: Payer: Self-pay | Admitting: Internal Medicine

## 2017-03-02 ENCOUNTER — Telehealth: Payer: Self-pay

## 2017-03-02 DIAGNOSIS — I5030 Unspecified diastolic (congestive) heart failure: Secondary | ICD-10-CM | POA: Diagnosis not present

## 2017-03-02 DIAGNOSIS — I25810 Atherosclerosis of coronary artery bypass graft(s) without angina pectoris: Secondary | ICD-10-CM | POA: Diagnosis not present

## 2017-03-02 DIAGNOSIS — N183 Chronic kidney disease, stage 3 (moderate): Secondary | ICD-10-CM | POA: Diagnosis not present

## 2017-03-02 DIAGNOSIS — I495 Sick sinus syndrome: Secondary | ICD-10-CM | POA: Diagnosis not present

## 2017-03-02 DIAGNOSIS — J449 Chronic obstructive pulmonary disease, unspecified: Secondary | ICD-10-CM | POA: Diagnosis not present

## 2017-03-02 DIAGNOSIS — I739 Peripheral vascular disease, unspecified: Secondary | ICD-10-CM | POA: Diagnosis not present

## 2017-03-02 NOTE — Telephone Encounter (Signed)
error 

## 2017-03-02 NOTE — Telephone Encounter (Signed)
M[facility] from [facility] called needing to speak with you. She said that the pt has been doing much better with their services (taking his medications, etc.) and he has requested to revoke his hospice benefits. They are needing orders asap. She can be reached at [phone].

## 2017-03-02 NOTE — Telephone Encounter (Signed)
Okay if he wants discharge from hospice. He should not need an order for this.

## 2017-03-02 NOTE — Telephone Encounter (Signed)
Orders were signed [facility] to [facility]

## 2017-03-03 ENCOUNTER — Ambulatory Visit

## 2017-03-08 ENCOUNTER — Telehealth: Payer: Self-pay | Admitting: Internal Medicine

## 2017-03-08 NOTE — Telephone Encounter (Signed)
Spoke with pt, he states he was on O2 and was ordered by CY and he needs a new Rx. He states he wanted to come see CY anyway because I advised him to RTC since his last OV was in March. I informed him that his insurance may be needing a face to face and re qualification to continue his O2. Pt agreed and made an appt on 03/15/2017 with CY. FYI

## 2017-03-12 ENCOUNTER — Telehealth: Payer: Self-pay | Admitting: Internal Medicine

## 2017-03-12 ENCOUNTER — Ambulatory Visit (INDEPENDENT_AMBULATORY_CARE_PROVIDER_SITE_OTHER): Admitting: Internal Medicine

## 2017-03-12 ENCOUNTER — Other Ambulatory Visit (INDEPENDENT_AMBULATORY_CARE_PROVIDER_SITE_OTHER): Payer: Medicare Other

## 2017-03-12 ENCOUNTER — Encounter: Payer: Self-pay | Admitting: Internal Medicine

## 2017-03-12 VITALS — BP 130/60 | HR 63 | Temp 98.5°F | Ht 66.0 in | Wt 208.0 lb

## 2017-03-12 DIAGNOSIS — J9611 Chronic respiratory failure with hypoxia: Secondary | ICD-10-CM | POA: Diagnosis not present

## 2017-03-12 DIAGNOSIS — D649 Anemia, unspecified: Secondary | ICD-10-CM | POA: Diagnosis not present

## 2017-03-12 DIAGNOSIS — I6523 Occlusion and stenosis of bilateral carotid arteries: Secondary | ICD-10-CM

## 2017-03-12 DIAGNOSIS — I5033 Acute on chronic diastolic (congestive) heart failure: Secondary | ICD-10-CM

## 2017-03-12 DIAGNOSIS — D61818 Other pancytopenia: Secondary | ICD-10-CM

## 2017-03-12 DIAGNOSIS — E785 Hyperlipidemia, unspecified: Secondary | ICD-10-CM | POA: Diagnosis not present

## 2017-03-12 DIAGNOSIS — J449 Chronic obstructive pulmonary disease, unspecified: Secondary | ICD-10-CM

## 2017-03-12 LAB — COMPREHENSIVE METABOLIC PANEL
ALT: 9 U/L (ref 0–53)
AST: 14 U/L (ref 0–37)
Albumin: 4.1 g/dL (ref 3.5–5.2)
Alkaline Phosphatase: 68 U/L (ref 39–117)
BUN: 29 mg/dL — ABNORMAL HIGH (ref 6–23)
CALCIUM: 10.2 mg/dL (ref 8.4–10.5)
CHLORIDE: 99 meq/L (ref 96–112)
CO2: 30 meq/L (ref 19–32)
Creatinine, Ser: 1.14 mg/dL (ref 0.40–1.50)
GFR: 64.44 mL/min (ref >60.00)
Glucose, Bld: 112 mg/dL — ABNORMAL HIGH (ref 70–99)
Potassium: 4.3 mEq/L (ref 3.5–5.1)
Sodium: 137 mEq/L (ref 135–145)
Total Bilirubin: 0.3 mg/dL (ref 0.2–1.2)
Total Protein: 8.1 g/dL (ref 6.0–8.3)

## 2017-03-12 LAB — CBC
HEMATOCRIT: 29.6 % — AB (ref 39.0–52.0)
Hemoglobin: 9.9 g/dL — ABNORMAL LOW (ref 13.0–17.0)
MCHC: 33.3 g/dL (ref 30.0–36.0)
MCV: 93.7 fl (ref 78.0–100.0)
PLATELETS: 177 10*3/uL (ref 150.0–400.0)
RBC: 3.16 Mil/uL — AB (ref 4.22–5.81)
RDW: 15.6 % — ABNORMAL HIGH (ref 11.5–15.5)
WBC: 4.5 10*3/uL (ref 4.0–10.5)

## 2017-03-12 MED ORDER — FUROSEMIDE 40 MG PO TABS: 40.0000 mg | ORAL_TABLET | Freq: Every day | ORAL | 3 refills | Status: DC

## 2017-03-12 NOTE — Progress Notes (Signed)
   Subjective:    Patient ID: Eric [name], male    DOB: 05/13/1927, 83 y.o.   MRN: [id number]  HPI The patient is an 83 YO man coming in for follow up of his medical problems including diastolic heart failure (likely in the setting of respiratory distress, taking lasix 80 mg BID currently, is urinating quite often which is putting him at risk of falls at night time, denies SOB or swelling, no chest pains), and his breathing (taking albuterol, combivent, still coughing but not productive, stamina is better since hospice was working with him at home, denies fevers or chills, uses oxygen 2L all the time 4L with exertion), and his myeloproliferative disorder (stopped taking sprycel due to side effects, has not had labs in some time, discharged from hospice currently). No new concerns.   Review of Systems  Constitutional: Positive for fatigue. Negative for activity change, appetite change, fever and unexpected weight change.  HENT: Negative.   Eyes: Negative.   Respiratory: Positive for cough. Negative for chest tightness and shortness of breath.   Cardiovascular: Negative for chest pain, palpitations and leg swelling.  Gastrointestinal: Negative for abdominal distention, abdominal pain, constipation, diarrhea, nausea and vomiting.  Genitourinary: Positive for enuresis and frequency. Negative for dysuria and urgency.  Musculoskeletal: Negative.   Skin: Negative.   Neurological: Negative.   Psychiatric/Behavioral: Negative.       Objective:   Physical Exam  Constitutional: He is oriented to person, place, and time. He appears well-developed and well-nourished.  HENT:  Head: Normocephalic and atraumatic.  Eyes: EOM are normal.  Neck: Normal range of motion. No JVD present.  Cardiovascular: Normal rate and regular rhythm.   Pulmonary/Chest: Effort normal and breath sounds normal. No respiratory distress. He has no wheezes. He has no rales.  On oxygen  Abdominal: Soft. Bowel sounds are  normal. He exhibits no distension. There is no tenderness. There is no rebound.  Musculoskeletal: He exhibits no edema.  Lymphadenopathy:    He has no cervical adenopathy.  Neurological: He is alert and oriented to person, place, and time. Coordination abnormal.  Cane, stable gait compared to prior  Skin: Skin is warm and dry.  Psychiatric: He has a normal mood and affect.   Vitals:   03/12/17 0847  BP: 130/60  Pulse: 63  Temp: 98.5 F (36.9 C)  TempSrc: Oral  SpO2: 99%  Weight: 208 lb (94.3 kg)  Height: 5\' 6"  (1.676 m)      Assessment & Plan:

## 2017-03-12 NOTE — Assessment & Plan Note (Signed)
He has decided to stop pravastatin. Due to quality of life.

## 2017-03-12 NOTE — Assessment & Plan Note (Signed)
Using oxygen at rest, also with exertion. Continue.

## 2017-03-12 NOTE — Telephone Encounter (Signed)
LVM for patient to call us back to let us know what type of wheel chair

## 2017-03-12 NOTE — Assessment & Plan Note (Signed)
Checking CBC. Not currently taking Sprycel.

## 2017-03-12 NOTE — Assessment & Plan Note (Signed)
Continues to use his maintenance inhalers, albuterol as needed. No flare at today's visit. Continues on oxygen at all times.

## 2017-03-12 NOTE — Assessment & Plan Note (Signed)
In the setting of respiratory distress. Will decrease diuretic due to increased fall risk with multiple bathroom trips. Continue Lasix 40 mg daily.

## 2017-03-12 NOTE — Telephone Encounter (Signed)
Does he want a manual wheelchair?

## 2017-03-12 NOTE — Patient Instructions (Addendum)
We will have you cut the fluid pill back to 1 pill once a day. This is the lasix (furosemide).    Fall Prevention in the Home Falls can cause injuries. They can happen to people of all ages. There are many things you can do to make your home safe and to help prevent falls. What can I do on the outside of my home?  Regularly fix the edges of walkways and driveways and fix any cracks.  Remove anything that might make you trip as you walk through a door, such as a raised step or threshold.  Trim any bushes or trees on the path to your home.  Use bright outdoor lighting.  Clear any walking paths of anything that might make someone trip, such as rocks or tools.  Regularly check to see if handrails are loose or broken. Make sure that both sides of any steps have handrails.  Any raised decks and porches should have guardrails on the edges.  Have any leaves, snow, or ice cleared regularly.  Use sand or salt on walking paths during winter.  Clean up any spills in your garage right away. This includes oil or grease spills. What can I do in the bathroom?  Use night lights.  Install grab bars by the toilet and in the tub and shower. Do not use towel bars as grab bars.  Use non-skid mats or decals in the tub or shower.  If you need to sit down in the shower, use a plastic, non-slip stool.  Keep the floor dry. Clean up any water that spills on the floor as soon as it happens.  Remove soap buildup in the tub or shower regularly.  Attach bath mats securely with double-sided non-slip rug tape.  Do not have throw rugs and other things on the floor that can make you trip. What can I do in the bedroom?  Use night lights.  Make sure that you have a light by your bed that is easy to reach.  Do not use any sheets or blankets that are too big for your bed. They should not hang down onto the floor.  Have a firm chair that has side arms. You can use this for support while you get  dressed.  Do not have throw rugs and other things on the floor that can make you trip. What can I do in the kitchen?  Clean up any spills right away.  Avoid walking on wet floors.  Keep items that you use a lot in easy-to-reach places.  If you need to reach something above you, use a strong step stool that has a grab bar.  Keep electrical cords out of the way.  Do not use floor polish or wax that makes floors slippery. If you must use wax, use non-skid floor wax.  Do not have throw rugs and other things on the floor that can make you trip. What can I do with my stairs?  Do not leave any items on the stairs.  Make sure that there are handrails on both sides of the stairs and use them. Fix handrails that are broken or loose. Make sure that handrails are as long as the stairways.  Check any carpeting to make sure that it is firmly attached to the stairs. Fix any carpet that is loose or worn.  Avoid having throw rugs at the top or bottom of the stairs. If you do have throw rugs, attach them to the floor with carpet tape.  Make sure that you have a light switch at the top of the stairs and the bottom of the stairs. If you do not have them, ask someone to add them for you. What else can I do to help prevent falls?  Wear shoes that: ? Do not have high heels. ? Have rubber bottoms. ? Are comfortable and fit you well. ? Are closed at the toe. Do not wear sandals.  If you use a stepladder: ? Make sure that it is fully opened. Do not climb a closed stepladder. ? Make sure that both sides of the stepladder are locked into place. ? Ask someone to hold it for you, if possible.  Clearly mark and make sure that you can see: ? Any grab bars or handrails. ? First and last steps. ? Where the edge of each step is.  Use tools that help you move around (mobility aids) if they are needed. These include: ? Canes. ? Walkers. ? Scooters. ? Crutches.  Turn on the lights when you go into a  dark area. Replace any light bulbs as soon as they burn out.  Set up your furniture so you have a clear path. Avoid moving your furniture around.  If any of your floors are uneven, fix them.  If there are any pets around you, be aware of where they are.  Review your medicines with your doctor. Some medicines can make you feel dizzy. This can increase your chance of falling. Ask your doctor what other things that you can do to help prevent falls. This information is not intended to replace advice given to you by your health care provider. Make sure you discuss any questions you have with your health care provider. Document Released: 03/21/2009 Document Revised: 10/31/2015 Document Reviewed: 06/29/2014 Elsevier Interactive Patient Education  [name].

## 2017-03-12 NOTE — Telephone Encounter (Signed)
Pt would like an order sent over to [facility] for a wheel chair.  He said that he is not able to walk long distances to get from his car to the office buildings that his doctors are in. Please advise.

## 2017-03-15 ENCOUNTER — Ambulatory Visit (INDEPENDENT_AMBULATORY_CARE_PROVIDER_SITE_OTHER): Admitting: Internal Medicine

## 2017-03-15 ENCOUNTER — Encounter: Payer: Self-pay | Admitting: Internal Medicine

## 2017-03-15 VITALS — BP 128/80 | HR 80 | Ht 66.0 in | Wt 210.0 lb

## 2017-03-15 DIAGNOSIS — J449 Chronic obstructive pulmonary disease, unspecified: Secondary | ICD-10-CM | POA: Diagnosis not present

## 2017-03-15 DIAGNOSIS — J9611 Chronic respiratory failure with hypoxia: Secondary | ICD-10-CM | POA: Diagnosis not present

## 2017-03-15 DIAGNOSIS — G4733 Obstructive sleep apnea (adult) (pediatric): Secondary | ICD-10-CM | POA: Diagnosis not present

## 2017-03-15 DIAGNOSIS — I6523 Occlusion and stenosis of bilateral carotid arteries: Secondary | ICD-10-CM | POA: Diagnosis not present

## 2017-03-15 NOTE — Patient Instructions (Addendum)
Order- [facility]- Resume management of CPAP 12, mask of choice, humidifier, supplies, AirView.    Compliance has been good. Taking over from [place].  Dx OSA                                        - Resume management of O2 continuous and portable.  2L rest and sleep, 4 L exertion. Dx COPD mixed type, chronic respiratory failure with hypoxia..Needs portable tanks or POC soon please.   Please call as needed

## 2017-03-15 NOTE — Telephone Encounter (Signed)
Pt would like a electric scooter.  Please advise

## 2017-03-15 NOTE — Progress Notes (Signed)
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, English as a second language teacher, followed for asthma/COPD, OSA, hypoxic respiratory failure, complicated by DM, obesity, CAD/ischemic CM/sick sinus brady tachy/ pacemaker, history of prostate cancer, anemia, permanent colostomy/diverticulitis  ------------------------------------------------------------------------------------------------------------ 09/01/2016-83 year old male former smoker,Veteran, followed for asthma/COPD, OSA, complicated by DM, obesity, CAD/ischemic CM/sick sinus/bradycardia tachycardia/pacemaker/chronic diastolic CHF, history of prostate cancer, anemia, chronic myeloid leukemia CPAP 12/[facility] [place]   O2 3LPOC/ sleep [facility] FOLLOWS FOR: Pt states he is doing better with new inhaler that Dr. Gwenette [name] gave him from the [place]. His O2 and CPAP are taken care of by the [facility] not our office.  Had CHF with possible pneumonia in January. History chronic diastolic CHF Still notes shortness of breath/dyspnea on exertion. Portable oxygen set on 3 L/[facility]. Assessment [facility] favors more asthma pattern then COPD. He asks for handicapped parking but otherwise feels well today. Continuing Spiriva, Symbicort, Combivent. CXR 07/01/2016 Improved aeration bilaterally with evidence of persistent interstitial edema.  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 Pt needs our office to resume his CPAPO2 needs; [place] then [facility] had been taking care of the O2 but no longer is under Hospice care. DME: [facility]. Pt is out of E-tanks for out of the house use; has concentrator to use at home. Hospice contract time ran out. Needs to change DME. Can't walk more than a few steps around his home without oxygen. CXR 10/13/16 IMPRESSION: Bilateral pleural  effusions with vascular congestion or mild pulmonary edema. Minimal change from the previous examination.  ROS-see HPI + = positive Constitutional:   No-   weight loss, night sweats, fevers, chills, fatigue, lassitude. HEENT:   No-  headaches, difficulty swallowing, tooth/dental problems, sore throat,       No-  sneezing, itching, ear ache, nasal congestion, post nasal drip,  CV:  No- recent  chest pain, orthopnea, PND. +Mild chronic swelling in lower extremities,                                              No- anasarca, dizziness, palpitations Resp: +  shortness of breath with exertion or at rest.               productive cough,   non-productive cough,  No- coughing up of blood.               change in color of mucus.  No- wheezing.   Skin: No-   rash or lesions. GI:  No-   heartburn, indigestion, abdominal pain, nausea, vomiting,  GU:  MS: . Limiting hip pain Neuro-     nothing unusual Psych:  No- change in mood or affect. No depression or anxiety.  No memory loss.  OBJ General- Alert, Oriented, Affect-appropriate, Distress- none acute, +overweight, O2 3L Skin- rash-none, lesions- none, excoriation- none, + flushed cheeks Lymphadenopathy- none Head- atraumatic            Eyes- Gross vision intact, PERRLA, conjunctivae clear secretions            Ears- Hearing aids, HOH            Nose- Clear, no-Septal dev, mucus, polyps, erosion, perforation             Throat- Mallampati II ,  mucosa clear , drainage- none, tonsils- atrophic, +dentures                     Neck- flexible , trachea midline, no stridor , thyroid nl, carotid no bruit Chest - symmetrical excursion , unlabored           Heart/CV- RRR /paced, no murmur , no gallop  , no rub, nl s1 s2                           - JVD- none , edema- none, stasis changes- none, varices- none           Lung- clear, unlabored, wheeze- none, cough-none, but sounds congested when he laughs,   dullness-none, rub-  none. .           Chest wall- +  pacemaker left chest Abd- + colostomy Br/ Gen/ Rectal- Not done, not indicated Extrem- cyanosis- none, clubbing, none, atrophy- none, strength- deconditioned, +cane Neuro- grossly intact to observation

## 2017-03-16 NOTE — Telephone Encounter (Signed)
Patient states that he had a rented manual wheel chair from hospice but they took it back. States he did not get an Transport planner through hospice because he would of had to pay for out of pocket. Patient seems very agitated, states that he cannot get anywhere with his oxygen and was not happy about me telling him that he may need another visit. I told him I would send this message to you and call him back with your suggestion.

## 2017-03-16 NOTE — Telephone Encounter (Signed)
I thought that he had already gotten one prior to entering hospice. We did all the paperwork etc then. Does he still have that. Likely would need another visit for evaluation due to medicare if we reordered.

## 2017-03-19 NOTE — Telephone Encounter (Signed)
Given prescription, he may need visit depending on insurance requirements. We can fax or he can pick up.

## 2017-03-21 NOTE — Assessment & Plan Note (Signed)
Stable control at this time with exacerbation.

## 2017-03-21 NOTE — Assessment & Plan Note (Addendum)
He wants to reestablish for CPAP and oxygen support through [facility] now that Hospice is no longer available. He has been compliant with CPAP.

## 2017-03-21 NOTE — Assessment & Plan Note (Signed)
He depends on continuous oxygen.

## 2017-03-22 ENCOUNTER — Encounter

## 2017-03-22 NOTE — Telephone Encounter (Signed)
Patient contacted and informed the RX for the electric scooter is ready and placed up front for him to pick up. Patient stated someone will be in today to pick it up

## 2017-03-24 ENCOUNTER — Ambulatory Visit (INDEPENDENT_AMBULATORY_CARE_PROVIDER_SITE_OTHER): Payer: Medicare Other | Admitting: *Deleted

## 2017-03-24 DIAGNOSIS — I495 Sick sinus syndrome: Secondary | ICD-10-CM

## 2017-03-24 LAB — CUP PACEART INCLINIC DEVICE CHECK
Battery Impedance: 1900 Ohm
Battery Voltage: 2.76 V
Implantable Lead Implant Date: 20090414
Implantable Lead Location: 753860
Lead Channel Impedance Value: 339 Ohm
Lead Channel Pacing Threshold Amplitude: 0.75 V
Lead Channel Sensing Intrinsic Amplitude: 2.6 mV
Lead Channel Sensing Intrinsic Amplitude: 9.1 mV
MDC IDC LEAD IMPLANT DT: 20090414
MDC IDC LEAD LOCATION: 753859
MDC IDC MSMT LEADCHNL RA PACING THRESHOLD PULSEWIDTH: 0.4 ms
MDC IDC MSMT LEADCHNL RV IMPEDANCE VALUE: 426 Ohm
MDC IDC MSMT LEADCHNL RV PACING THRESHOLD AMPLITUDE: 1 V
MDC IDC MSMT LEADCHNL RV PACING THRESHOLD PULSEWIDTH: 0.4 ms
MDC IDC PG IMPLANT DT: 20090414
MDC IDC PG SERIAL: 2094876
MDC IDC SESS DTM: 20181017162621
MDC IDC SET LEADCHNL RA PACING AMPLITUDE: 2 V
MDC IDC SET LEADCHNL RV PACING PULSEWIDTH: 0.4 ms
MDC IDC SET LEADCHNL RV SENSING SENSITIVITY: 2 mV
Pulse Gen Model: 5826

## 2017-03-24 NOTE — Progress Notes (Signed)
Pacemaker check in clinic. Normal device function. Thresholds, sensing, impedances consistent with previous measurements. Device programmed to maximize longevity. <1% AT/AF burden, max dur. 11hrs 27mins, Max A 591bpm; no EGMs + ASA--83 yo M, h/o HTN, DM, anemia. Device programmed at appropriate safety margins. Histogram distribution appropriate for patient activity level. Device programmed to optimize intrinsic conduction. Estimated longevity 4-6.5 years. Patient will follow up with SK in 6 months.

## 2017-03-25 DIAGNOSIS — H02051 Trichiasis without entropian right upper eyelid: Secondary | ICD-10-CM | POA: Diagnosis not present

## 2017-03-25 DIAGNOSIS — E119 Type 2 diabetes mellitus without complications: Secondary | ICD-10-CM | POA: Diagnosis not present

## 2017-03-25 DIAGNOSIS — H02054 Trichiasis without entropian left upper eyelid: Secondary | ICD-10-CM | POA: Diagnosis not present

## 2017-03-25 DIAGNOSIS — H26492 Other secondary cataract, left eye: Secondary | ICD-10-CM | POA: Diagnosis not present

## 2017-03-25 LAB — HM DIABETES EYE EXAM

## 2017-03-30 DIAGNOSIS — R300 Dysuria: Secondary | ICD-10-CM | POA: Diagnosis not present

## 2017-03-31 ENCOUNTER — Telehealth: Payer: Self-pay | Admitting: Internal Medicine

## 2017-03-31 NOTE — Telephone Encounter (Signed)
Pt said that his lasix was cut back to one a day but he is beginning to gain weight and was told to call back if it was not enough.  He would like to increase it. Please advise.

## 2017-04-01 NOTE — Telephone Encounter (Signed)
Pt notified and expressed understanding

## 2017-04-01 NOTE — Telephone Encounter (Signed)
Increase to 1.5 pills daily for 5 days then back to 1 pill daily.

## 2017-04-14 ENCOUNTER — Other Ambulatory Visit: Payer: Self-pay | Admitting: Urology

## 2017-04-14 ENCOUNTER — Telehealth: Payer: Self-pay | Admitting: Interventional Cardiology

## 2017-04-14 ENCOUNTER — Telehealth: Payer: Self-pay | Admitting: Internal Medicine

## 2017-04-14 DIAGNOSIS — T83420A Displacement of penile (implanted) prosthesis, initial encounter: Secondary | ICD-10-CM | POA: Diagnosis not present

## 2017-04-14 NOTE — Progress Notes (Signed)
Please place orders in Epic as patient is being scheduled for a pre-op appointment! Thank you! 

## 2017-04-14 NOTE — Telephone Encounter (Signed)
New Message     [facility] Pre-operative Risk Assessment    Request for surgical clearance:  1. What type of surgery is being performed? Eroded Penile removal  2. When is this surgery scheduled? Dr Jeffie Pollock  3. Are there any medications that need to be held prior to surgery and how long? Asprin and any blood thinner  4. Practice name and name of physician performing surgery? [facility]   5. What is your office phone and fax number? [phone] ext [phone] Fax [phone]  6. Anesthesia type (None, local, MAC, general) ? Choice   Tamera Reason 04/14/2017, 2:42 PM  _________________________________________________________________   (provider comments below)

## 2017-04-14 NOTE — Telephone Encounter (Signed)
Will forward this message to KW to be on the look out for this paper for surgical clearance from [facility].

## 2017-04-15 ENCOUNTER — Encounter (HOSPITAL_COMMUNITY): Payer: Self-pay

## 2017-04-15 NOTE — Patient Instructions (Addendum)
XAN INGRAHAM  04/15/2017   Your procedure is scheduled on: 04-22-17  Report to [facility] Main  Entrance Take [place]  elevators to 3rd floor to  [place] at 11:00AM.   Call this number if you have problems the morning of surgery [phone]   Remember: ONLY 1 PERSON MAY GO WITH YOU TO SHORT STAY TO GET  READY MORNING OF YOUR SURGERY.  Do not eat food After Midnight. You may have clear liquids from midnight until 7AM day of surgery. Nothing by mouth after 7AM!     Take these medicines the morning of surgery with A SIP OF WATER: tylenol if needed, nebulizer if needed, inhalers (may bring to hospital), nasal spray                                 You may not have any metal on your body including hair pins and              piercings  Do not wear jewelry, make-up, lotions, powders or perfumes, deodorant                          Men may shave face and neck.   Do not bring valuables to the hospital. [place].  Contacts, dentures or bridgework may not be worn into surgery.  Leave suitcase in the car. After surgery it may be brought to your room.                   Please read over the following fact sheets you were given: _____________________________________________________________________    CLEAR LIQUID DIET   Foods Allowed                                                                     Foods Excluded  Coffee and tea, regular and decaf                             liquids that you cannot  Plain Jell-O in any flavor                                             see through such as: Fruit ices (not with fruit pulp)                                     milk, soups, orange juice  Iced Popsicles                                    All solid food Carbonated beverages, regular and diet  Cranberry, grape and apple juices Sports drinks like Gatorade Lightly seasoned  clear broth or consume(fat free) Sugar, honey syrup  Sample Menu Breakfast                                Lunch                                     Supper Cranberry juice                    Beef broth                            Chicken broth Jell-O                                     Grape juice                           Apple juice Coffee or tea                        Jell-O                                      Popsicle                                                Coffee or tea                        Coffee or tea  _____________________________________________________________________ How to Manage Your Diabetes Before and After Surgery  Why is it important to control my blood sugar before and after surgery? . Improving blood sugar levels before and after surgery helps healing and can limit problems. . A way of improving blood sugar control is eating a healthy diet by: o  Eating less sugar and carbohydrates o  Increasing activity/exercise o  Talking with your doctor about reaching your blood sugar goals . High blood sugars (greater than 180 mg/dL) can raise your risk of infections and slow your recovery, so you will need to focus on controlling your diabetes during the weeks before surgery. . Make sure that the doctor who takes care of your diabetes knows about your planned surgery including the date and location.  How do I manage my blood sugar before surgery? . Check your blood sugar at least 4 times a day, starting 2 days before surgery, to make sure that the level is not too high or low. o Check your blood sugar the morning of your surgery when you wake up and every 2 hours until you get to the Short Stay unit. . If your blood sugar is less than 70 mg/dL, you will need to treat for low blood sugar: o Do not take insulin. o Treat a low blood sugar (less than 70 mg/dL) with  cup of clear juice (cranberry or apple), 4 glucose tablets, OR glucose gel. o Recheck  blood sugar in 15  minutes after treatment (to make sure it is greater than 70 mg/dL). If your blood sugar is not greater than 70 mg/dL on recheck, call [phone] for further instructions. . Report your blood sugar to the short stay nurse when you get to Short Stay.  . If you are admitted to the hospital after surgery: o Your blood sugar will be checked by the staff and you will probably be given insulin after surgery (instead of oral diabetes medicines) to make sure you have good blood sugar levels. o The goal for blood sugar control after surgery is 80-180 mg/dL.  WHAT TO DO WITH DIABETES MEDICATION?  THE DAY BEFORE SURGERY. TAKE MORNING DOSE OF GLIPIZIDE (GLUCOTROL) . DO NOT TAKE EVENING DOSE!!   DAY OF SURGERY . DO NOT TAKE ANY GLIPIZIDE(GLUCOTROL)    Patient Signature:  Date:   Nurse Signature:  Date:   Reviewed and Endorsed by [facility] Patient Education Committee, August 2015    [facility] - Preparing for Surgery Before surgery, you can play an important role.  Because skin is not sterile, your skin needs to be as free of germs as possible.  You can reduce the number of germs on your skin by washing with CHG (chlorahexidine gluconate) soap before surgery.  CHG is an antiseptic cleaner which kills germs and bonds with the skin to continue killing germs even after washing. Please DO NOT use if you have an allergy to CHG or antibacterial soaps.  If your skin becomes reddened/irritated stop using the CHG and inform your nurse when you arrive at Short Stay. Do not shave (including legs and underarms) for at least 48 hours prior to the first CHG shower.  You may shave your face/neck. Please follow these instructions carefully:  1.  Shower with CHG Soap the night before surgery and the  morning of Surgery.  2.  If you choose to wash your hair, wash your hair first as usual with your  normal  shampoo.  3.  After you shampoo, rinse your hair and body thoroughly to remove the  shampoo.                            4.  Use CHG as you would any other liquid soap.  You can apply chg directly  to the skin and wash                       Gently with a scrungie or clean washcloth.  5.  Apply the CHG Soap to your body ONLY FROM THE NECK DOWN.   Do not use on face/ open                           Wound or open sores. Avoid contact with eyes, ears mouth and genitals (private parts).                       Wash face,  Genitals (private parts) with your normal soap.             6.  Wash thoroughly, paying special attention to the area where your surgery  will be performed.  7.  Thoroughly rinse your body with warm water from the neck down.  8.  DO NOT shower/wash with your normal soap after using and rinsing off  the CHG Soap.  9.  Pat yourself dry with a clean towel.            10.  Wear clean pajamas.            11.  Place clean sheets on your bed the night of your first shower and do not  sleep with pets. Day of Surgery : Do not apply any lotions/deodorants the morning of surgery.  Please wear clean clothes to the hospital/surgery center.  FAILURE TO FOLLOW THESE INSTRUCTIONS MAY RESULT IN THE CANCELLATION OF YOUR SURGERY PATIENT SIGNATURE_________________________________  NURSE SIGNATURE__________________________________  ________________________________________________________________________

## 2017-04-15 NOTE — Progress Notes (Signed)
Pacemaker Device Orders on chart   [facility] Pulmonology Dr Annamaria Boots 03-15-17 epic  [facility] cardiology Dr Caryl Comes 09-21-16 epic  EKG 10-13-16 epic  CXR 10-13-16 epic  ECHO 05-19-16 epic

## 2017-04-16 ENCOUNTER — Encounter (HOSPITAL_COMMUNITY)
Admission: RE | Admit: 2017-04-16 | Discharge: 2017-04-16 | Disposition: A | Discharge status: Home / Self Care | Payer: Medicare Other | Source: Ambulatory Visit | Attending: Urology | Admitting: Urology

## 2017-04-16 ENCOUNTER — Other Ambulatory Visit: Payer: Self-pay

## 2017-04-16 ENCOUNTER — Encounter (HOSPITAL_COMMUNITY): Payer: Self-pay

## 2017-04-16 ENCOUNTER — Ambulatory Visit (HOSPITAL_COMMUNITY)
Admission: RE | Admit: 2017-04-16 | Discharge: 2017-04-16 | Disposition: A | Discharge status: Home / Self Care | Payer: Medicare Other | Source: Ambulatory Visit | Attending: Anesthesiology | Admitting: Anesthesiology

## 2017-04-16 DIAGNOSIS — I700 Atherosclerosis of aorta: Secondary | ICD-10-CM | POA: Diagnosis not present

## 2017-04-16 DIAGNOSIS — Z01812 Encounter for preprocedural laboratory examination: Secondary | ICD-10-CM | POA: Insufficient documentation

## 2017-04-16 DIAGNOSIS — Z01811 Encounter for preprocedural respiratory examination: Secondary | ICD-10-CM

## 2017-04-16 DIAGNOSIS — Z01818 Encounter for other preprocedural examination: Secondary | ICD-10-CM | POA: Insufficient documentation

## 2017-04-16 DIAGNOSIS — J90 Pleural effusion, not elsewhere classified: Secondary | ICD-10-CM | POA: Insufficient documentation

## 2017-04-16 LAB — BASIC METABOLIC PANEL
ANION GAP: 10 (ref 5–15)
BUN: 25 mg/dL — ABNORMAL HIGH (ref 6–20)
CO2: 26 mmol/L (ref 22–32)
CREATININE: 1.45 mg/dL — AB (ref 0.61–1.24)
Calcium: 9.7 mg/dL (ref 8.9–10.3)
Chloride: 105 mmol/L (ref 101–111)
GFR, EST AFRICAN AMERICAN: 48 mL/min — AB (ref >60)
GFR, EST NON AFRICAN AMERICAN: 41 mL/min — AB (ref >60)
Glucose, Bld: 96 mg/dL (ref 65–99)
Potassium: 4.3 mmol/L (ref 3.5–5.1)
SODIUM: 141 mmol/L (ref 135–145)

## 2017-04-16 LAB — HEMOGLOBIN A1C
Hgb A1c MFr Bld: 6.5 % — ABNORMAL HIGH (ref 4.8–5.6)
MEAN PLASMA GLUCOSE: 139.85 mg/dL

## 2017-04-16 LAB — CBC
HCT: 27.2 % — ABNORMAL LOW (ref 39.0–52.0)
Hemoglobin: 8.5 g/dL — ABNORMAL LOW (ref 13.0–17.0)
MCH: 29.7 pg (ref 26.0–34.0)
MCHC: 31.3 g/dL (ref 30.0–36.0)
MCV: 95.1 fL (ref 78.0–100.0)
PLATELETS: 212 10*3/uL (ref 150–400)
RBC: 2.86 MIL/uL — ABNORMAL LOW (ref 4.22–5.81)
RDW: 14.9 % (ref 11.5–15.5)
WBC: 6.8 10*3/uL (ref 4.0–10.5)

## 2017-04-16 LAB — GLUCOSE, CAPILLARY: GLUCOSE-CAPILLARY: 91 mg/dL (ref 65–99)

## 2017-04-16 NOTE — Telephone Encounter (Signed)
    Chart reviewed as part of pre-operative protocol coverage. Because of Eric [name]'s past medical history and time since last visit, he/she will require a follow-up visit in order to better assess preoperative cardiovascular risk.   Primary Cardiologist: Dr. Tamala Julian (last seen 07/2015) Primary Electrophysiologist: Dr. Caryl Comes (last seen 09/21/16)  Pre-op covering staff: - Please schedule appointment and call patient to inform them. - Please contact requesting surgeon's office via preferred method (i.e, phone, fax) to inform them of need for appointment prior to surgery.  Dexter, PA  04/16/2017, 3:09 PM

## 2017-04-16 NOTE — Telephone Encounter (Signed)
Chart review indicates patient is not on any anticoagulants.

## 2017-04-16 NOTE — Progress Notes (Signed)
CBC, BMP, CXR routed via epic to Dr Keene Breath

## 2017-04-19 NOTE — Telephone Encounter (Signed)
Spoke with pt re: surgical clearance for his upcoming procedure that is scheduled for 04/22/17. Pt has been scheduled to see Robbie Lis, PA-C 04/20/17 @ 11:00. Pt thanked me for the call.

## 2017-04-19 NOTE — Telephone Encounter (Signed)
Routing back to pre-op covering staff. Needs appt as below. Not clear that this was arranged (previously erroneously sent to pharm pool). Quavion Boule PA-C

## 2017-04-19 NOTE — Telephone Encounter (Signed)
No forms have come across my desk; please contact patient and/or office from where they were being sent by. Thanks.

## 2017-04-19 NOTE — Telephone Encounter (Signed)
Called Pam and asked her to refax surgical clearance. I will await fax.

## 2017-04-19 NOTE — Telephone Encounter (Signed)
Surgical clearance from [facility] on CY cart.

## 2017-04-19 NOTE — Telephone Encounter (Signed)
Eric [name] has this paperwork been received? Thanks.

## 2017-04-20 ENCOUNTER — Encounter: Payer: Self-pay | Admitting: Physician Assistant

## 2017-04-20 ENCOUNTER — Ambulatory Visit (INDEPENDENT_AMBULATORY_CARE_PROVIDER_SITE_OTHER): Payer: Medicare Other | Admitting: Physician Assistant

## 2017-04-20 VITALS — BP 150/60 | HR 67 | Ht 66.0 in | Wt 205.0 lb

## 2017-04-20 DIAGNOSIS — E785 Hyperlipidemia, unspecified: Secondary | ICD-10-CM

## 2017-04-20 DIAGNOSIS — I25701 Atherosclerosis of coronary artery bypass graft(s), unspecified, with angina pectoris with documented spasm: Secondary | ICD-10-CM | POA: Diagnosis not present

## 2017-04-20 DIAGNOSIS — I10 Essential (primary) hypertension: Secondary | ICD-10-CM | POA: Diagnosis not present

## 2017-04-20 DIAGNOSIS — Z01818 Encounter for other preprocedural examination: Secondary | ICD-10-CM

## 2017-04-20 DIAGNOSIS — I5033 Acute on chronic diastolic (congestive) heart failure: Secondary | ICD-10-CM

## 2017-04-20 DIAGNOSIS — I209 Angina pectoris, unspecified: Secondary | ICD-10-CM

## 2017-04-20 DIAGNOSIS — I6523 Occlusion and stenosis of bilateral carotid arteries: Secondary | ICD-10-CM

## 2017-04-20 DIAGNOSIS — I5032 Chronic diastolic (congestive) heart failure: Secondary | ICD-10-CM | POA: Diagnosis not present

## 2017-04-20 NOTE — Progress Notes (Signed)
Cardiology Office Note    Date:  04/20/2017   ID:  Eric [name], DOB September 28, 1926, MRN [id number]  PCP:  [name], MD  Primary Cardiologist:Dr. Tamala Julian (last seen 07/2016) Primary Electrophysiologist:Dr. Caryl Comes (last seen 09/21/16)  Chief Complaint: surgical clearance for Eroded Penile removal  History of Present Illness:   Eric [name] is a 83 y.o. male  with CAD s/p CABG and multiple PCIs, hypertension, hyperlipidemia, COPD, sinus node dysfunction s/p PPM and chronic diastolic heart failure, CML and COPD on #L oxygen presents for surgical clearance.   Hx of CAD s/p CABG in 1995, DES in 2008, 2009, and most recent in 2012 with DES to SVG-OM. Last echo 12/17 showed LVEF of 55-65%, no WM abnormality and grade 1 DD.   Today patient is here to surgical clearance. Recent device check 03/24/17 was normal. Patient has some kind of prostate device and need to take it out due to severe penile pain. She has stable DOE. He used oxygen 24/7. 2L at rest and 3-4L with ambulation. No chest pain. He said that someone took him off ASA about 2 months ago.   Past Medical History:  Diagnosis Date  . ALLERGIC RHINITIS   . ANEMIA-NOS   . AORTIC SCLEROSIS   . Asthma   . CARDIOMYOPATHY, ISCHEMIC   . CAROTID BRUIT, RIGHT 02/27/2008  . Cataract    surgery  . CML (chronic myeloid leukemia) ([place]) 06/26/2015  . COPD   . CORONARY ARTERY DISEASE    a. s/p CABG in 1995 b. DES in 2008, 2009, and most recent in 2012 with DES to SVG-OM  . DIABETES MELLITUS-TYPE II    diet controlled  . Diastolic dysfunction, Grade 1 11/24/2014  . Diverticulitis of colon with perforation 11/22/2014  . Diverticulosis   . GERD   . HIATAL HERNIA   . Hx of echocardiogram    Echo (9/15):  Mild LVH, EF 50-55%, no RWMA, Gr 1 DD, MAC, mild LAE.  [name] HYPERLIPIDEMIA   . HYPERTENSION   . Hyponatremia 11/22/2014  . IBS (irritable bowel syndrome)   . LACTOSE INTOLERANCE   . OA (osteoarthritis)   . OBESITY   . On home  oxygen therapy    "3L all the time" (10/12/2016)  . Partial small bowel obstruction ([place])   . PERIPHERAL VASCULAR DISEASE   . Primary hyperparathyroidism ([place])    Lab Results Component Value Date  PTH 150.7* 02/13/2013  CALCIUM 11.0* 02/13/2013  CAION 1.21 03/15/2008    . Prostate cancer ([place])    seed implants 2004  . SICK SINUS/ TACHY-BRADY SYNDROME 09/2007   s/p PPM st [facility]  . Sleep apnea   . SMALL BOWEL OBSTRUCTION 04/18/2009   Qualifier: History of  By: Asa Lente MD, Jannifer Rodney Symptomatic diverticulosis 01/18/2009   Qualifier: Diagnosis of  By: Shane Crutch, Amy S     Past Surgical History:  Procedure Laterality Date  . BACK SURGERY    . Bilateral cataracts    . COLON SURGERY    . COLONOSCOPY    . CORONARY ARTERY BYPASS GRAFT    . ESOPHAGOGASTRODUODENOSCOPY  multiple  . INGUINAL HERNIA REPAIR Bilateral   . LUMBAR [place] SURGERY  12/2008  . PACEMAKER INSERTION     DDD/[employer]         Last interrogation 2/13  on chart     Pacemaker guideline order Dr Tamala Julian on chart  . Partial small bowel obstruction  2009  . PENILE PROSTHESIS  PLACEMENT    . PTCA  2008, 2009, 2012   with DES    Current Medications: Prior to Admission medications   Medication Sig Start Date End Date Taking? Authorizing Provider  acetaminophen (TYLENOL) 500 MG tablet Take 1,000 mg by mouth every 6 (six) hours as needed for mild pain or fever.    [provider]  albuterol (PROVENTIL HFA) 108 (90 Base) MCG/ACT inhaler Inhale 2 puffs into the lungs every 4 (four) hours as needed for shortness of breath. Wheezing Patient not taking: Reported on 04/16/2017 06/09/16   [name], MD  fluticasone [facility]) 50 MCG/ACT nasal spray Place 2 sprays into both nostrils daily. Allergies 08/01/14   [name], MD  FLUZONE HIGH-DOSE 0.5 ML injection Inject 0.5 mLs once into the skin. 03/26/17 03/26/17   [provider]  furosemide (LASIX) 40 MG tablet Take 1 tablet (40 mg total) by  mouth daily. Patient taking differently: Take 40 mg 2 (two) times daily by mouth.  03/12/17   [name], MD  glipiZIDE (GLUCOTROL) 5 MG tablet Take 2.5 mg 2 (two) times daily by mouth.    [provider]  guaiFENesin-dextromethorphan (ROBITUSSIN DM) 100-10 MG/5ML syrup Take 5 mLs by mouth every 4 (four) hours as needed for cough. 10/15/16   Lavina Hamman, MD  HYDROcodone-acetaminophen (NORCO) 7.5-325 MG tablet Take 1 tablet by mouth every 6 (six) hours as needed for moderate pain or severe pain. 01/05/17   [place], FNP  ipratropium-albuterol (DUONEB) 0.5-2.5 (3) MG/3ML SOLN INHALE 1 vial (3 msl) via NEBULIZER 3 TIMES DAILY AND EVERY 4 HOURS AS NEEDED 02/17/17   [provider]  nitroGLYCERIN (NITROSTAT) 0.4 MG SL tablet Place 0.4 mg under the tongue every 5 (five) minutes as needed for chest pain. Reported on 12/12/2015    [provider]  Polyethyl Glycol-Propyl Glycol (SYSTANE) 0.4-0.3 % SOLN Apply 2 drops to eye 3 (three) times daily as needed (dry eyes).     [provider]  potassium chloride (K-DUR) 10 MEQ tablet Take 2 tablets (20 mEq total) by mouth daily. Patient not taking: Reported on 04/16/2017 10/15/16   Lavina Hamman, MD  potassium chloride SA (K-DUR,KLOR-CON) 20 MEQ tablet Take 20 mEq daily by mouth.    [provider]  ranitidine (ZANTAC) 300 MG tablet Take 300 mg by mouth at bedtime.     [provider]  sulfamethoxazole-trimethoprim (BACTRIM DS,SEPTRA DS) 800-160 MG tablet Take 1 tablet daily by mouth.    [provider]  triamcinolone cream (KENALOG) 0.1 % Apply 1 application topically 3 (three) times daily as needed (dry skin.).  07/19/15   [provider]  umeclidinium-vilanterol (ANORO ELLIPTA) 62.5-25 MCG/INH AEPB Inhale 1 puff into the lungs daily. Patient not taking: Reported on 04/16/2017 07/21/16   Parrett, Fonnie Mu, NP    Allergies:   Actos [pioglitazone hydrochloride]; Buprenorphine  hcl; Celebrex [celecoxib]; Demerol; Meperidine; Morphine and related; Ciprofloxacin; Metformin; and Zocor [simvastatin]   Social History   Socioeconomic History  . Marital status: Widowed    Spouse name: None  . Number of children: None  . Years of education: None  . Highest education level: None  Social Needs  . Financial resource strain: None  . Food insecurity - worry: None  . Food insecurity - inability: None  . Transportation needs - medical: None  . Transportation needs - non-medical: None  Occupational History  . Occupation: retired    Fish farm manager: RETIRED  Tobacco Use  .  Smoking status: Former Smoker    Packs/day: 1.00    Years: 25.00    Pack years: 25.00    Types: Cigarettes    Last attempt to quit: 06/08/1994    Years since quitting: 22.8  . Smokeless tobacco: Never Used  Substance and Sexual Activity  . Alcohol use: No    Alcohol/week: 0.0 oz  . Drug use: No  . 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 None  Social History Narrative  . None     Family History:  The patient's family history includes Cancer in his mother; Heart attack in his brother, unknown relative, and unknown relative; Hypertension in his mother.   ROS:   Please see the history of present illness.    ROS All other systems reviewed and are negative.   PHYSICAL EXAM:   VS:  BP (!) 150/60   Pulse 67   Ht 5' 6"  (1.676 m)   Wt 205 lb (93 kg)   SpO2 96%   BMI 33.09 kg/m    GEN: Well nourished, well developed, in no acute distress  HEENT: normal  Neck: no JVD, carotid bruits, or masses Cardiac: RRR; no murmurs, rubs, or gallops,no edema  Respiratory:  clear to auscultation bilaterally, normal work of breathing GI: soft, nontender, nondistended, + BS MS: no deformity or atrophy  Skin: warm and dry, no rash Neuro:  Alert and Oriented x 3, Strength and sensation are intact Psych: euthymic mood, full  affect  Wt Readings from Last 3 Encounters:  04/20/17 205 lb (93 kg)  04/16/17 209 lb (94.8 kg)  03/15/17 210 lb (95.3 kg)      Studies/Labs Reviewed:   EKG:  EKG is ordered today.  The ekg ordered today demonstrates NSR  Recent Labs: 05/19/2016: TSH 1.754 07/01/2016: Magnesium 2.3 10/12/2016: B Natriuretic Peptide 496.0 03/12/2017: ALT 9 04/16/2017: BUN 25; Creatinine, Ser 1.45; Hemoglobin 8.5; Platelets 212; Potassium 4.3; Sodium 141   Lipid Panel    Component Value Date/Time   CHOL 108 05/27/2015 1512   TRIG 151.0 (H) 05/27/2015 1512   TRIG 70 01/06/2010   HDL 22.70 (L) 05/27/2015 1512   CHOLHDL 5 05/27/2015 1512   VLDL 30.2 05/27/2015 1512   LDLCALC 55 05/27/2015 1512    Additional studies/ records that were reviewed today include:   As above    ASSESSMENT & PLAN:    1. CAD s/p CABG and multiple PCIs - No angina. PCP took off statin due to fall risk/quality of life per note 03/12/17. He is not taking ASA 68m qd for past 2-3 months. Unable to find indications. Advised to resume ASA 855mQD after the procedure.   2. Hypertension -Elevated today. He hasn't took his diuretics today. Advised to keep log.   3. Hyperlipidemia -No results found for requested labs within last 8760 hours. no longer on statin. Managed by PCP.   3. Sinus node dysfunction s/p PPM - Followed by Dr. KlCaryl Comes4. Chronic diastolic heart failure - Euvolemic. Continue lasix.   5.Surgical clearance - Stable at baseline.  Given past medical history and time since last visit, based on ACC/AHA guidelines, WiMYCAL CONDEould be at acceptable risk for the planned procedure without further cardiovascular testing. ASA as above.    Medication Adjustments/Labs and Tests Ordered: Current medicines are reviewed at length with the patient today.  Concerns regarding medicines are outlined above.  Medication  changes, Labs and Tests ordered today are listed in the Patient Instructions below. Patient  Instructions  Medication Instructions:  Your physician recommends that you continue on your current medications as directed. Please refer to the Current Medication list given to you today.   Labwork: None ordered  Testing/Procedures: None ordered  Follow-Up: Your physician recommends that you schedule a follow-up appointment in: [place]   Any Other Special Instructions Will Be Listed Below (If Applicable).     If you need a refill on your cardiac medications before your next appointment, please call your pharmacy.      Jarrett Soho, [place]  04/20/2017 11:13 AM    [address], [place], [place]  [address] Phone: [phone]; Fax: [phone]

## 2017-04-20 NOTE — Telephone Encounter (Signed)
Done

## 2017-04-20 NOTE — H&P (Signed)
CC/HPI: CC: Incontinence.   04/14/17: Eric [name] returns today in f/u. He has completed a 2 week course of Bactrim for a UTI's. He has had some improvement in his symptoms, but he still has incontinence that is worse with increased fluid intake. He has soaked his pajamas several times this week. He has some burning but no hematuria.   03/30/17: History of prostate cancer treated with seed implant back in 2002-10-04. He also has an IPP but hasn't used it in several years. Last seen In 10-04-15 for recurrent balanitis. He states over the past year, he's had several hospitalizations related to exacerbations of COPD and pneumonia. He does have a large hernia surrounding his colostomy stoma but this is a chronic finding. He states his stool output has been within his baseline and there has not been blood.     ALLERGIES: actos Buprenorphine Cipro Demerol Meperidine metformin morphine Myrbetriq TB24 Zocor    MEDICATIONS: Acetaminophen 500 mg tablet  Albuterol Sulfate  Aspirin Ec 81 mg tablet, delayed release  Ensure  Flonase Allergy Relief  Furosemide 40 mg tablet  Hydrocodone-Acetaminophen 7.5 mg-325 mg tablet  Polyethylene Glycol  Potassium Chloride 10 meq capsule, extended release  Pravastatin Sodium  Ranitidine Hcl 300 mg capsule  Robitussin  Triamcinolone Acetonide 0.1 % cream     GU PSH: PLACE RT DEVICE/MARKER, PROS 10/04/2006 Prostate Needle Biopsy - October 04, 2006      [place] Notes: Colostomy, Hip Surgery, Back Surgery, Permanent Pacemaker Model ___, Cath Stent Placement, Biopsy Of The Prostate Needle, Prostate Place Interstitial Dev For Radiation Guide Multiple   NON-GU PSH: Colostomy - 10/04/14    GU PMH: Dysuria - 03/30/2017 Disorder of Penis Ot, Penile pain - October 04, 2015 ED due to arterial insufficiency, Erectile dysfunction due to arterial insufficiency - Oct 04, 2014 History of prostate cancer, History of prostate cancer - October 04, 2014 History of urolithiasis, History of kidney stones - 04-Oct-2014 Nocturia,  Nocturia - 10-04-2014      PMH Notes:  2006-10-05 10:27:02 - Note: Coronary Artery Disease  2006-10-05 10:27:02 - Note: Peptic Ulcer  2006-10-05 10:27:02 - Note: Arthritis   NON-GU PMH: Encounter for general adult medical examination without abnormal findings, Encounter for preventive health examination - October 04, 2015 Personal history of other infectious and parasitic diseases, History of candidiasis - Oct 04, 2014 Asthma, Asthma - 10/03/2012 Hyperthyroidism, Hyperthyroidism - 10/03/2012 Other intervertebral disc degeneration, lumbar region, Lumbar Disc Degeneration - 10/03/2012 Personal history of other diseases of the circulatory system, History of hypertension - 10/03/2012 Personal history of other diseases of the digestive system, History of esophageal reflux - 10-03-12 Personal history of other diseases of the nervous system and sense organs, History of sleep apnea - 10/03/2012 Personal history of other endocrine, nutritional and metabolic disease, History of hypercholesterolemia - 10/03/12, History of diabetes mellitus, - 2012/10/03    FAMILY HISTORY: Death of family member - Runs In [place] Status Number - Runs In Family   SOCIAL HISTORY: None    Notes: Former smoker, Tobacco Use, Marital History - Widowed, Retired From Work, Caffeine Use, Alcohol Use   REVIEW OF SYSTEMS:    GU Review Male:   Patient reports hard to postpone urination, burning/ pain with urination, and get up at night to urinate. Patient denies frequent urination, leakage of urine, stream starts and stops, trouble starting your stream, have to strain to urinate , erection problems, and penile pain.  Gastrointestinal (Upper):   Patient denies nausea, vomiting, and indigestion/ heartburn.  Gastrointestinal (Lower):   Patient denies diarrhea and  constipation.  Constitutional:   Patient denies fever, night sweats, weight loss, and fatigue.  Skin:   Patient denies skin rash/ lesion and itching.  Eyes:   Patient denies blurred vision and double vision.  Ears/ Nose/  Throat:   Patient denies sore throat and sinus problems.  Hematologic/Lymphatic:   Patient denies easy bruising and swollen glands.  Cardiovascular:   Patient denies leg swelling and chest pains.  Respiratory:   Patient denies cough and shortness of breath.  Endocrine:   Patient denies excessive thirst.  Musculoskeletal:   Patient denies back pain and joint pain.  Neurological:   Patient denies headaches and dizziness.  Psychologic:   Patient denies depression and anxiety.   VITAL SIGNS:      04/14/2017 01:28 PM  Weight 202 lb / 91.63 kg  Height 66 in / 167.64 cm  BP 169/71 mmHg  Pulse 62 /min  Temperature 98.0 F / 36.6 C  BMI 32.6 kg/m   GU PHYSICAL EXAMINATION:    Scrotum: No lesions. No edema. No cysts. No warts. The prosthesis pump is not inflammed.   Penis: Circumcised, A prosthesis cylinder is visible at the meatus.    MULTI-SYSTEM PHYSICAL EXAMINATION:    Constitutional: Well-nourished. No physical deformities. Normally developed. Good grooming.  Respiratory: No labored breathing, no use of accessory muscles. distant BS.  Cardiovascular: Normal temperature, RRR without murmur.   Gastrointestinal: Obese abdomen. Colostomy/ileostomy present. He has a very large parastomal hernia. No mass, no tenderness, no rigidity.      PAST DATA REVIEWED:  Source Of History:  Patient   04/09/15 09/20/14 06/29/14 01/16/14 12/21/12 12/23/11 12/24/10 10/23/09  PSA  Total PSA 0.15  0.13  0.17  0.11  0.03  0.03  0.03  <0.04     PROCEDURES: None   ASSESSMENT:      ICD-10 Details  1 GU:   Displacement implanted penile prosthesis, initial enc - T83.420A He has an eroded penile prosthesis and will need to be explanted. I have reviewed the risks of bleeding, infection, injury to adjacent structures, need for a foley catheter, thrombotic events, anesthetic complications and death.    PLAN:            Medications New Meds: Bactrim Ds 800 mg-160 mg tablet 1 tablet PO Daily   #30  0 Refill(s)             Schedule Return Visit/Planned Activity: ASAP - Schedule Surgery

## 2017-04-20 NOTE — Patient Instructions (Addendum)
Medication Instructions:  Your physician recommends that you continue on your current medications as directed. Please refer to the Current Medication list given to you today.   Labwork: None ordered  Testing/Procedures: None ordered  Follow-Up: Your physician recommends that you schedule a follow-up appointment in: 3 MONTHS WITH DR. SMITH    Any Other Special Instructions Will Be Listed Below (If Applicable).     If you need a refill on your cardiac medications before your next appointment, please call your pharmacy.   

## 2017-04-21 ENCOUNTER — Encounter (HOSPITAL_COMMUNITY): Payer: Self-pay | Admitting: *Deleted

## 2017-04-21 MED ORDER — GENTAMICIN SULFATE 40 MG/ML IJ SOLN
5.0000 mg/kg | INTRAVENOUS | Status: AC
  Administered 2017-04-22: 380 mg via INTRAVENOUS
  Filled 2017-04-21 (×2): qty 9.5

## 2017-04-21 NOTE — Telephone Encounter (Signed)
Katie- did you fax? If so please close encounter thanks

## 2017-04-21 NOTE — Progress Notes (Signed)
LOV/Cardiac clearance epic 04-20-17  Bhagat PA

## 2017-04-22 ENCOUNTER — Encounter (HOSPITAL_COMMUNITY)
Admission: RE | Disposition: A | Discharge status: Home Health | Payer: Self-pay | Source: Ambulatory Visit | Attending: Urology

## 2017-04-22 ENCOUNTER — Ambulatory Visit (HOSPITAL_COMMUNITY): Payer: Medicare Other | Admitting: Anesthesiology

## 2017-04-22 ENCOUNTER — Inpatient Hospital Stay (HOSPITAL_COMMUNITY)
Admission: RE | Admit: 2017-04-22 | Discharge: 2017-04-24 | DRG: 675 | Disposition: A | Discharge status: Home Health | Payer: Medicare Other | Source: Ambulatory Visit | Attending: Urology | Admitting: Urology

## 2017-04-22 ENCOUNTER — Encounter (HOSPITAL_COMMUNITY): Payer: Self-pay | Admitting: *Deleted

## 2017-04-22 ENCOUNTER — Other Ambulatory Visit: Payer: Self-pay

## 2017-04-22 DIAGNOSIS — T83420A Displacement of penile (implanted) prosthesis, initial encounter: Principal | ICD-10-CM | POA: Diagnosis present

## 2017-04-22 DIAGNOSIS — Z8546 Personal history of malignant neoplasm of prostate: Secondary | ICD-10-CM

## 2017-04-22 DIAGNOSIS — R32 Unspecified urinary incontinence: Secondary | ICD-10-CM | POA: Diagnosis not present

## 2017-04-22 DIAGNOSIS — Z9981 Dependence on supplemental oxygen: Secondary | ICD-10-CM | POA: Diagnosis not present

## 2017-04-22 DIAGNOSIS — I2510 Atherosclerotic heart disease of native coronary artery without angina pectoris: Secondary | ICD-10-CM | POA: Diagnosis present

## 2017-04-22 DIAGNOSIS — E669 Obesity, unspecified: Secondary | ICD-10-CM | POA: Diagnosis present

## 2017-04-22 DIAGNOSIS — Z87891 Personal history of nicotine dependence: Secondary | ICD-10-CM | POA: Diagnosis not present

## 2017-04-22 DIAGNOSIS — E1151 Type 2 diabetes mellitus with diabetic peripheral angiopathy without gangrene: Secondary | ICD-10-CM | POA: Diagnosis present

## 2017-04-22 DIAGNOSIS — Z932 Ileostomy status: Secondary | ICD-10-CM

## 2017-04-22 DIAGNOSIS — Z7982 Long term (current) use of aspirin: Secondary | ICD-10-CM | POA: Diagnosis not present

## 2017-04-22 DIAGNOSIS — Z6833 Body mass index (BMI) 33.0-33.9, adult: Secondary | ICD-10-CM

## 2017-04-22 DIAGNOSIS — K219 Gastro-esophageal reflux disease without esophagitis: Secondary | ICD-10-CM | POA: Diagnosis present

## 2017-04-22 DIAGNOSIS — Y831 Surgical operation with implant of artificial internal device as the cause of abnormal reaction of the patient, or of later complication, without mention of misadventure at the time of the procedure: Secondary | ICD-10-CM | POA: Diagnosis present

## 2017-04-22 DIAGNOSIS — I10 Essential (primary) hypertension: Secondary | ICD-10-CM | POA: Diagnosis present

## 2017-04-22 DIAGNOSIS — T8389XA Other specified complication of genitourinary prosthetic devices, implants and grafts, initial encounter: Secondary | ICD-10-CM | POA: Diagnosis not present

## 2017-04-22 DIAGNOSIS — E785 Hyperlipidemia, unspecified: Secondary | ICD-10-CM | POA: Diagnosis not present

## 2017-04-22 DIAGNOSIS — J449 Chronic obstructive pulmonary disease, unspecified: Secondary | ICD-10-CM | POA: Diagnosis present

## 2017-04-22 DIAGNOSIS — I110 Hypertensive heart disease with heart failure: Secondary | ICD-10-CM | POA: Diagnosis not present

## 2017-04-22 DIAGNOSIS — I5033 Acute on chronic diastolic (congestive) heart failure: Secondary | ICD-10-CM | POA: Diagnosis not present

## 2017-04-22 DIAGNOSIS — Z7984 Long term (current) use of oral hypoglycemic drugs: Secondary | ICD-10-CM

## 2017-04-22 DIAGNOSIS — Z933 Colostomy status: Secondary | ICD-10-CM | POA: Diagnosis not present

## 2017-04-22 DIAGNOSIS — T83410A Breakdown (mechanical) of penile (implanted) prosthesis, initial encounter: Secondary | ICD-10-CM | POA: Diagnosis not present

## 2017-04-22 HISTORY — PX: REMOVAL OF PENILE PROSTHESIS: SHX6059

## 2017-04-22 LAB — GLUCOSE, CAPILLARY
GLUCOSE-CAPILLARY: 122 mg/dL — AB (ref 65–99)
GLUCOSE-CAPILLARY: 167 mg/dL — AB (ref 65–99)
Glucose-Capillary: 113 mg/dL — ABNORMAL HIGH (ref 65–99)
Glucose-Capillary: 194 mg/dL — ABNORMAL HIGH (ref 65–99)

## 2017-04-22 SURGERY — REMOVAL, PENILE PROSTHESIS
Anesthesia: General

## 2017-04-22 MED ORDER — ACETAMINOPHEN 325 MG PO TABS
650.0000 mg | ORAL_TABLET | ORAL | Status: DC | PRN
  Administered 2017-04-24: 650 mg via ORAL
  Filled 2017-04-22: qty 2

## 2017-04-22 MED ORDER — 0.9 % SODIUM CHLORIDE (POUR BTL) OPTIME
TOPICAL | Status: DC | PRN
  Administered 2017-04-22: 1000 mL

## 2017-04-22 MED ORDER — FUROSEMIDE 40 MG PO TABS
40.0000 mg | ORAL_TABLET | Freq: Two times a day (BID) | ORAL | Status: DC
  Administered 2017-04-22 – 2017-04-24 (×4): 40 mg via ORAL
  Filled 2017-04-22 (×4): qty 1

## 2017-04-22 MED ORDER — SODIUM CHLORIDE 0.9 % IV SOLN
INTRAVENOUS | Status: AC
  Filled 2017-04-22: qty 500000

## 2017-04-22 MED ORDER — FENTANYL CITRATE (PF) 100 MCG/2ML IJ SOLN
INTRAMUSCULAR | Status: AC
  Filled 2017-04-22: qty 2

## 2017-04-22 MED ORDER — LIDOCAINE 2% (20 MG/ML) 5 ML SYRINGE
INTRAMUSCULAR | Status: AC
  Filled 2017-04-22: qty 5

## 2017-04-22 MED ORDER — ONDANSETRON HCL 4 MG/2ML IJ SOLN
INTRAMUSCULAR | Status: DC | PRN
  Administered 2017-04-22: 4 mg via INTRAVENOUS

## 2017-04-22 MED ORDER — IPRATROPIUM-ALBUTEROL 0.5-2.5 (3) MG/3ML IN SOLN
3.0000 mL | RESPIRATORY_TRACT | Status: DC | PRN
  Administered 2017-04-23: 3 mL via RESPIRATORY_TRACT
  Filled 2017-04-22: qty 3

## 2017-04-22 MED ORDER — OXYCODONE HCL 5 MG PO TABS
5.0000 mg | ORAL_TABLET | ORAL | Status: DC | PRN
  Administered 2017-04-24 (×2): 5 mg via ORAL
  Filled 2017-04-22 (×2): qty 1

## 2017-04-22 MED ORDER — GENTAMICIN SULFATE 40 MG/ML IJ SOLN
5.0000 mg/kg | INTRAVENOUS | Status: AC
  Administered 2017-04-23: 380 mg via INTRAVENOUS
  Filled 2017-04-22: qty 9.5

## 2017-04-22 MED ORDER — FAMOTIDINE 20 MG PO TABS
20.0000 mg | ORAL_TABLET | Freq: Every day | ORAL | Status: DC
  Administered 2017-04-22 – 2017-04-23 (×2): 20 mg via ORAL
  Filled 2017-04-22 (×2): qty 1

## 2017-04-22 MED ORDER — PROPOFOL 10 MG/ML IV BOLUS
INTRAVENOUS | Status: DC | PRN
  Administered 2017-04-22: 120 mg via INTRAVENOUS

## 2017-04-22 MED ORDER — CEFAZOLIN SODIUM-DEXTROSE 2-4 GM/100ML-% IV SOLN
2.0000 g | Freq: Three times a day (TID) | INTRAVENOUS | Status: AC
  Administered 2017-04-22: 2 g via INTRAVENOUS
  Filled 2017-04-22: qty 100

## 2017-04-22 MED ORDER — PROPOFOL 10 MG/ML IV BOLUS
INTRAVENOUS | Status: AC
  Filled 2017-04-22: qty 20

## 2017-04-22 MED ORDER — ONDANSETRON HCL 4 MG/2ML IJ SOLN
4.0000 mg | INTRAMUSCULAR | Status: DC | PRN
Start: 2017-04-22 — End: 2017-04-24

## 2017-04-22 MED ORDER — DEXAMETHASONE SODIUM PHOSPHATE 10 MG/ML IJ SOLN
INTRAMUSCULAR | Status: DC | PRN
  Administered 2017-04-22: 10 mg via INTRAVENOUS

## 2017-04-22 MED ORDER — INSULIN ASPART 100 UNIT/ML ~~LOC~~ SOLN
0.0000 [IU] | Freq: Three times a day (TID) | SUBCUTANEOUS | Status: DC
  Administered 2017-04-22 – 2017-04-23 (×4): 3 [IU] via SUBCUTANEOUS

## 2017-04-22 MED ORDER — FENTANYL CITRATE (PF) 100 MCG/2ML IJ SOLN
INTRAMUSCULAR | Status: DC | PRN
  Administered 2017-04-22 (×4): 25 ug via INTRAVENOUS

## 2017-04-22 MED ORDER — ONDANSETRON HCL 4 MG/2ML IJ SOLN: 4.0000 mg | Freq: Once | INTRAMUSCULAR | Status: DC | PRN

## 2017-04-22 MED ORDER — MORPHINE SULFATE (PF) 4 MG/ML IV SOLN
2.0000 mg | INTRAVENOUS | Status: DC | PRN
  Administered 2017-04-22 (×2): 2 mg via INTRAVENOUS
  Filled 2017-04-22 (×2): qty 1

## 2017-04-22 MED ORDER — PHENYLEPHRINE 40 MCG/ML (10ML) SYRINGE FOR IV PUSH (FOR BLOOD PRESSURE SUPPORT)
PREFILLED_SYRINGE | INTRAVENOUS | Status: DC | PRN
  Administered 2017-04-22 (×4): 80 ug via INTRAVENOUS

## 2017-04-22 MED ORDER — HYDROCODONE-ACETAMINOPHEN 7.5-325 MG PO TABS: 1.0000 | ORAL_TABLET | Freq: Four times a day (QID) | ORAL | 0 refills | Status: DC | PRN

## 2017-04-22 MED ORDER — IPRATROPIUM-ALBUTEROL 0.5-2.5 (3) MG/3ML IN SOLN
3.0000 mL | Freq: Three times a day (TID) | RESPIRATORY_TRACT | Status: DC
  Administered 2017-04-22 – 2017-04-24 (×5): 3 mL via RESPIRATORY_TRACT
  Filled 2017-04-22 (×5): qty 3

## 2017-04-22 MED ORDER — HYDROCODONE-ACETAMINOPHEN 7.5-325 MG PO TABS: 1.0000 | ORAL_TABLET | Freq: Once | ORAL | Status: DC | PRN

## 2017-04-22 MED ORDER — HYDROMORPHONE HCL 1 MG/ML IJ SOLN: 0.5000 mg | INTRAMUSCULAR | Status: DC | PRN

## 2017-04-22 MED ORDER — LACTATED RINGERS IV SOLN
INTRAVENOUS | Status: DC
  Administered 2017-04-22: 11:00:00 via INTRAVENOUS

## 2017-04-22 MED ORDER — DEXTROSE-NACL 5-0.45 % IV SOLN: INTRAVENOUS | Status: DC

## 2017-04-22 MED ORDER — IPRATROPIUM-ALBUTEROL 0.5-2.5 (3) MG/3ML IN SOLN
RESPIRATORY_TRACT | Status: AC
  Filled 2017-04-22: qty 3

## 2017-04-22 MED ORDER — CEFAZOLIN SODIUM-DEXTROSE 2-4 GM/100ML-% IV SOLN
2.0000 g | INTRAVENOUS | Status: AC
  Administered 2017-04-22: 2 g via INTRAVENOUS
  Filled 2017-04-22: qty 100

## 2017-04-22 MED ORDER — LIDOCAINE 2% (20 MG/ML) 5 ML SYRINGE
INTRAMUSCULAR | Status: DC | PRN
  Administered 2017-04-22: 100 mg via INTRAVENOUS

## 2017-04-22 MED ORDER — FENTANYL CITRATE (PF) 100 MCG/2ML IJ SOLN
25.0000 ug | INTRAMUSCULAR | Status: DC | PRN
  Administered 2017-04-22 (×3): 50 ug via INTRAVENOUS

## 2017-04-22 SURGICAL SUPPLY — 46 items
BAG URINE DRAINAGE (UROLOGICAL SUPPLIES) ×6 IMPLANT
BANDAGE COBAN STERILE 2 (GAUZE/BANDAGES/DRESSINGS) IMPLANT
BENZOIN TINCTURE PRP APPL 2/3 (GAUZE/BANDAGES/DRESSINGS) ×3 IMPLANT
BLADE HEX COATED 2.75 (ELECTRODE) ×3 IMPLANT
BLADE SURG 15 STRL LF DISP TIS (BLADE) ×2 IMPLANT
BLADE SURG 15 STRL SS (BLADE) ×4
CATH FOLEY 2W COUNCIL 5CC 18FR (CATHETERS) ×3 IMPLANT
CATH FOLEY 2WAY 5CC 16FR (CATHETERS) ×4
CATH FOLEY 2WAY SLVR  5CC 16FR (CATHETERS) ×2
CATH FOLEY 2WAY SLVR 5CC 16FR (CATHETERS) ×1 IMPLANT
CATH URET 5FR 28IN OPEN ENDED (CATHETERS) IMPLANT
CATH URTH STD 16FR FL 2W DRN (CATHETERS) ×2 IMPLANT
CLOSURE WOUND 1/2 X4 (GAUZE/BANDAGES/DRESSINGS) ×1
COVER MAYO STAND STRL (DRAPES) ×3 IMPLANT
COVER SURGICAL LIGHT HANDLE (MISCELLANEOUS) ×3 IMPLANT
DISSECTOR ROUND CHERRY 3/8 STR (MISCELLANEOUS) IMPLANT
DRAIN PENROSE 18X1/4 LTX STRL (WOUND CARE) ×3 IMPLANT
DRAPE LAPAROTOMY T 102X78X121 (DRAPES) ×3 IMPLANT
DRSG TEGADERM 4X4.75 (GAUZE/BANDAGES/DRESSINGS) ×3 IMPLANT
ELECT REM PT RETURN 15FT ADLT (MISCELLANEOUS) ×3 IMPLANT
GAUZE SPONGE 4X4 12PLY STRL (GAUZE/BANDAGES/DRESSINGS) ×3 IMPLANT
GLOVE SURG SS PI 8.0 STRL IVOR (GLOVE) IMPLANT
GOWN STRL REUS W/TWL XL LVL3 (GOWN DISPOSABLE) ×3 IMPLANT
GUIDEWIRE STR DUAL SENSOR (WIRE) ×3 IMPLANT
KIT BASIN OR (CUSTOM PROCEDURE TRAY) ×3 IMPLANT
NS IRRIG 1000ML POUR BTL (IV SOLUTION) ×3 IMPLANT
PACK GENERAL/GYN (CUSTOM PROCEDURE TRAY) ×3 IMPLANT
PAD ABD 8X10 STRL (GAUZE/BANDAGES/DRESSINGS) ×3 IMPLANT
PLUG CATH AND CAP STER (CATHETERS) ×3 IMPLANT
RETRACTOR WILSON SYSTEM (INSTRUMENTS) IMPLANT
SOL PREP POV-IOD 4OZ 10% (MISCELLANEOUS) ×3 IMPLANT
SOL PREP PROV IODINE SCRUB 4OZ (MISCELLANEOUS) ×3 IMPLANT
SPONGE LAP 4X18 X RAY DECT (DISPOSABLE) IMPLANT
STRIP CLOSURE SKIN 1/2X4 (GAUZE/BANDAGES/DRESSINGS) ×2 IMPLANT
SUPPORT SCROTAL LG STRP (MISCELLANEOUS) ×2 IMPLANT
SUPPORTER ATHLETIC LG (MISCELLANEOUS) ×1
SUT CHROMIC 3 0 SH 27 (SUTURE) ×3 IMPLANT
SUT ETHILON 3 0 PS 1 (SUTURE) ×3 IMPLANT
SUT PDS AB 2-0 CT2 27 (SUTURE) ×3 IMPLANT
SWAB COLLECTION DEVICE MRSA (MISCELLANEOUS) ×3 IMPLANT
SWAB CULTURE ESWAB REG 1ML (MISCELLANEOUS) ×3 IMPLANT
SYR 20CC LL (SYRINGE) ×3 IMPLANT
SYR 50ML LL SCALE MARK (SYRINGE) ×6 IMPLANT
TAPE CLOTH SURG 4X10 WHT LF (GAUZE/BANDAGES/DRESSINGS) ×3 IMPLANT
TOWEL OR 17X26 10 PK STRL BLUE (TOWEL DISPOSABLE) ×3 IMPLANT
WATER STERILE IRR 1000ML POUR (IV SOLUTION) ×3 IMPLANT

## 2017-04-22 NOTE — Transfer of Care (Signed)
Immediate Anesthesia Transfer of Care Note  Patient: Eric [name]  Procedure(s) Performed: EXPLANT OF MULTICOMPONENT PENILE PROSTHESIS (N/A )  Patient Location: PACU  Anesthesia Type:General  Level of Consciousness: sedated  Airway & Oxygen Therapy: Patient Spontanous Breathing and Patient connected to face mask oxygen  Post-op Assessment: Report given to RN and Post -op Vital signs reviewed and stable  Post vital signs: Reviewed and stable  Last Vitals:  Vitals:   04/22/17 1115 04/22/17 1123  BP:  (!) 162/61  Pulse: 84   Resp: 20   Temp: (!) 36.4 C   SpO2: 100%     Last Pain:  Vitals:   04/22/17 1115  TempSrc: Oral      Patients Stated Pain Goal: 4 (72/09/47 0962)  Complications: No apparent anesthesia complications

## 2017-04-22 NOTE — Interval H&P Note (Signed)
History and Physical Interval Note:  04/22/2017 12:06 PM  Eric [name]  has presented today for surgery, with the diagnosis of ERODED PENILE PROSTHESIS  The various methods of treatment have been discussed with the patient and family. After consideration of risks, benefits and other options for treatment, the patient has consented to  Procedure(s): EXPLANT OF MULTICOMPONENT PENILE PROSTHESIS (N/A) as a surgical intervention .  The patient's history has been reviewed, patient examined, no change in status, stable for surgery.  I have reviewed the patient's chart and labs.  Questions were answered to the patient's satisfaction.     Mikaili Flippin J

## 2017-04-22 NOTE — Anesthesia Procedure Notes (Signed)
Procedure Name: LMA Insertion Date/Time: 04/22/2017 12:27 PM Performed by: Lind Covert, CRNA Pre-anesthesia Checklist: Patient identified, Emergency Drugs available, Suction available, Patient being monitored and Timeout performed Patient Re-evaluated:Patient Re-evaluated prior to induction Oxygen Delivery Method: Circle system utilized Preoxygenation: Pre-oxygenation with 100% oxygen Induction Type: IV induction LMA: LMA inserted LMA Size: 5.0 Number of attempts: 1 Placement Confirmation: ETT inserted through vocal cords under direct vision,  positive ETCO2 and breath sounds checked- equal and bilateral Tube secured with: Tape Dental Injury: Teeth and Oropharynx as per pre-operative assessment

## 2017-04-22 NOTE — Progress Notes (Signed)
Post-op note   Subjective: The patient is doing well.  No complaints.  Objective: Vital signs in last 24 hours: Temp:  [97.5 F (36.4 C)-98.9 F (37.2 C)] 98.9 F (37.2 C) (11/15 1528) Pulse Rate:  [62-84] 76 (11/15 1528) Resp:  [11-20] 16 (11/15 1528) BP: (162-191)/(61-81) 180/79 (11/15 1528) SpO2:  [96 %-100 %] 99 % (11/15 1528) Weight:  [93 kg (205 lb)] 93 kg (205 lb) (11/15 1115)  Intake/Output from previous day: No intake/output data recorded. Intake/Output this shift: Total I/O In: 500 [I.V.:500] Out: 95 [Urine:75; Blood:20]  Physical Exam:  General: Alert and oriented. Abdomen: Soft, Nondistended, Penrose in LLQ. Scrotum packed Incisions: Clean and dry. GU: foley catheter to drainage - clear  Lab Results: No results for input(s): HGB, HCT in the last 72 hours.  Assessment/Plan: POD#0 s/p IPP explant. On IV gent and ancef. Pain controlled   1) Continue to monitor   Alla Feeling, MD, MD   LOS: 0 days   Alla Feeling, MD 04/22/2017, 4:33 PM

## 2017-04-22 NOTE — Anesthesia Postprocedure Evaluation (Signed)
Anesthesia Post Note  Patient: RANDEL HARGENS  Procedure(s) Performed: EXPLANT OF MULTICOMPONENT PENILE PROSTHESIS (N/A )     Patient location during evaluation: PACU Anesthesia Type: General Level of consciousness: awake and alert and oriented Pain management: pain level controlled Vital Signs Assessment: post-procedure vital signs reviewed and stable Respiratory status: spontaneous breathing, nonlabored ventilation, respiratory function stable and patient connected to nasal cannula oxygen Cardiovascular status: blood pressure returned to baseline and stable Postop Assessment: no apparent nausea or vomiting Anesthetic complications: no    Last Vitals:  Vitals:   04/22/17 1430 04/22/17 1445  BP: (!) 172/62 (!) 191/79  Pulse: 77 65  Resp: 16 17  Temp:    SpO2: 100% 96%    Last Pain:  Vitals:   04/22/17 1423  TempSrc:   PainSc: 8                  Kosisochukwu Burningham A.

## 2017-04-22 NOTE — Progress Notes (Signed)
Patient ID: Eric [name], male   DOB: April 12, 1927, 83 y.o.   MRN: [id number] He is doing well post op with minimal pain.  BP (!) 180/79 (BP Location: Right Arm)   Pulse 76   Temp 98.9 F (37.2 C)   Resp 16   Ht 5\' 6"  (1.676 m)   Wt 93 kg (205 lb)   SpO2 99%   BMI 33.09 kg/m   Urine clear.  I have requested a home health assessment for dressing changes post discharge.

## 2017-04-22 NOTE — Op Note (Addendum)
Preoperative Diagnosis: IPP erosion   Postoperative Diagnosis: Same  Procedure(s) Performed:  Procedure(s): - EXPLANT OF MULTICOMPONENT PENILE PROSTHESIS - Cystourethroscopy  - Complex foley catheter placement   Surgeon:  Surgeon(s): Irine Seal, MD  Resident Surgeon: Alla Feeling, MD, MD  Anesthesia:  General  Fluids:  See anesthesia record  Estimated blood loss:  20 mL  Specimens:   ID Type Source Tests Collected by Time Destination  1 : explanted penile prosthesis  Hardware [facility] Only SURGICAL PATHOLOGY Irine Seal, MD 04/22/2017 1327   A : scrotal fluid Body Fluid Wound AEROBIC/ANAEROBIC CULTURE (SURGICAL/DEEP WOUND) Irine Seal, MD 04/22/2017 1300     Drains:  Penrose in reservoir fossa   Complications:  None  Indications:  This is a 83 y.o. patient with a history of Ed s/p IPP now with IPP erosion through urethra. After discussion of the risks & benefits and alternatives to surgical approach, the patient wishes to proceed with IPP explant. Risks & benefits of the procedure discussed with the patient, who wishes to proceed.   Findings:   - Initial exam: fixed IPP pump, IPP extruding through urethral meatus  - Purulence and urine encountered upon entry into penoscrotal region  - Left IPP cylinder eroded thought urethra  - All components explanted  - Counter incision required for reservoir explant  - Cystoscopy required for foley catheter placement, 79fr council placed with 10cc in balloon  - Scrotum packed WtD  Procedure:   After the induction of a general anesthetic, the patient was prepped and draped in the usual surgical sterile fashion in the modified lithotomy position. Great care was taken to pad all possible pressure points. Intravenous antibiotics were administered and two separate preoperative timeouts were performed.   On initial exam, the IPP pump was fixed within the mid part of the scrotum, the tip of IPP cylinder was seen extruding from the  urethral meatus.  We began by making a transverse incision along the median raphae.  We quickly encountered urine and purulence.  These were cultured.  Using cut current we made our way onto the IPP pump.  Once the pump was delivered we followed the tubing more proximally towards the corpora.  The surrounding tissue was fibrotic and clearly inflamed.  By following the tubing to the corpora, we were able to make 1 cm corporotomies and deliver both cylinders.  We paid careful attention to the location of the urethra.  At this point we followed the reservoir tubing proximally.  Using careful dissection and retraction we were able to follow the tubing as it dipped below the pubic symphysis.  At this point we could not safely deliver the reservoir from the exposure we had and therefore made a 5 cm counterincision in the left suprapubic crease.  We quickly dissected down to the pubic symphysis and onto the reservoir.  It was delivered without difficulty.  A Penrose drain was placed in the reservoir fossa and brought out of the inguinal incision.  Inguinal incision was closed with both deep and superficial layers.  We then inspected the scrotum and irrigated it copiously.  There was no active bleeding, hemostasis was excellent.  At this point we attempted Foley catheter placement.  On initial attempt to place the Foley catheter, it quickly deviated into the left corpora cavernosum.  At this point we performed flexible cystoscopy and with much difficulty were able to find the proximal portion of the urethra and made or way into the bladder.  A sensor wire  was placed into the bladder then an 6 [place] council tip catheter was guided over the sensor wire into the bladder.  It was filled with 10 cc of sterile water.  At this point we opted to pack the scrotal wound given the amount of purulence initially encountered.  The scrotal wound was packed with wet-to-dry Kerlix.  At this point the patient was ordered from anesthesia  and recovered in PACU.  Plan: 1. Admit to floor  2. Continue gent and ancef 3. Coordinate home health for daily wound packing

## 2017-04-22 NOTE — Anesthesia Preprocedure Evaluation (Addendum)
Anesthesia Evaluation  Patient identified by MRN, date of birth, ID band Patient awake    Reviewed: Allergy & Precautions, NPO status , Patient's Chart, lab work & pertinent test results  Airway Mallampati: II  TM Distance: >3 FB Neck ROM: Full    Dental  (+) Caps   Pulmonary asthma , sleep apnea and Continuous Positive Airway Pressure Ventilation , COPD,  COPD inhaler and oxygen dependent, former smoker,    Pulmonary exam normal breath sounds clear to auscultation       Cardiovascular hypertension, + CAD, + Past MI, + Cardiac Stents, + CABG, + Peripheral Vascular Disease and +CHF  Normal cardiovascular exam+ dysrhythmias + pacemaker  Rhythm:Regular Rate:Normal  Sick sinus syndrome CABG 1995 DES 2008,2009,20012 LVEF 50-55%   Neuro/Psych Anxiety  Neuromuscular disease    GI/Hepatic Neg liver ROS, hiatal hernia, GERD  Medicated and Controlled,  Endo/Other  diabetes, Well Controlled, Type 2, Oral Hypoglycemic AgentsHyperlipidemia Obesity  Renal/GU negative Renal ROS   Eroded penile prosthesis    Musculoskeletal  (+) Arthritis , Osteoarthritis,    Abdominal (+) + obese,   Peds  Hematology  (+) Blood dyscrasia, anemia , Myeloproliferative disorder CML  Pancytopenia   Anesthesia Other Findings   Reproductive/Obstetrics                            Anesthesia Physical Anesthesia Plan  ASA: III  Anesthesia Plan: General   Post-op Pain Management:    Induction: Intravenous  PONV Risk Score and Plan: 4 or greater and Ondansetron and Treatment may vary due to age or medical condition  Airway Management Planned: LMA  Additional Equipment:   Intra-op Plan:   Post-operative Plan: Extubation in OR  Informed Consent: I have reviewed the patients History and Physical, chart, labs and discussed the procedure including the risks, benefits and alternatives for the proposed anesthesia with the  patient or authorized representative who has indicated his/her understanding and acceptance.   Dental advisory given  Plan Discussed with: CRNA, Anesthesiologist and Surgeon  Anesthesia Plan Comments:         Anesthesia Quick Evaluation

## 2017-04-22 NOTE — Telephone Encounter (Signed)
[facility] faxed back forms on 04/21/17. Will close encounter. Thanks.

## 2017-04-23 ENCOUNTER — Encounter (HOSPITAL_COMMUNITY): Payer: Self-pay | Admitting: Urology

## 2017-04-23 DIAGNOSIS — J449 Chronic obstructive pulmonary disease, unspecified: Secondary | ICD-10-CM | POA: Diagnosis not present

## 2017-04-23 DIAGNOSIS — Z933 Colostomy status: Secondary | ICD-10-CM | POA: Diagnosis not present

## 2017-04-23 DIAGNOSIS — Z87891 Personal history of nicotine dependence: Secondary | ICD-10-CM | POA: Diagnosis not present

## 2017-04-23 DIAGNOSIS — Z8546 Personal history of malignant neoplasm of prostate: Secondary | ICD-10-CM | POA: Diagnosis not present

## 2017-04-23 DIAGNOSIS — R32 Unspecified urinary incontinence: Secondary | ICD-10-CM | POA: Diagnosis not present

## 2017-04-23 DIAGNOSIS — T83420A Displacement of penile (implanted) prosthesis, initial encounter: Secondary | ICD-10-CM | POA: Diagnosis not present

## 2017-04-23 DIAGNOSIS — Z7982 Long term (current) use of aspirin: Secondary | ICD-10-CM | POA: Diagnosis not present

## 2017-04-23 LAB — BASIC METABOLIC PANEL
Anion gap: 9 (ref 5–15)
BUN: 26 mg/dL — AB (ref 6–20)
CHLORIDE: 101 mmol/L (ref 101–111)
CO2: 26 mmol/L (ref 22–32)
CREATININE: 1.33 mg/dL — AB (ref 0.61–1.24)
Calcium: 9.4 mg/dL (ref 8.9–10.3)
GFR calc Af Amer: 53 mL/min — ABNORMAL LOW (ref >60)
GFR calc non Af Amer: 46 mL/min — ABNORMAL LOW (ref >60)
Glucose, Bld: 135 mg/dL — ABNORMAL HIGH (ref 65–99)
Potassium: 5.1 mmol/L (ref 3.5–5.1)
SODIUM: 136 mmol/L (ref 135–145)

## 2017-04-23 LAB — GLUCOSE, CAPILLARY
GLUCOSE-CAPILLARY: 153 mg/dL — AB (ref 65–99)
GLUCOSE-CAPILLARY: 137 mg/dL — AB (ref 65–99)
Glucose-Capillary: 155 mg/dL — ABNORMAL HIGH (ref 65–99)
Glucose-Capillary: 156 mg/dL — ABNORMAL HIGH (ref 65–99)

## 2017-04-23 LAB — HEMOGLOBIN AND HEMATOCRIT, BLOOD
HCT: 24.8 % — ABNORMAL LOW (ref 39.0–52.0)
Hemoglobin: 7.9 g/dL — ABNORMAL LOW (ref 13.0–17.0)

## 2017-04-23 MED ORDER — GENTAMICIN SULFATE 40 MG/ML IJ SOLN
5.0000 mg/kg | INTRAVENOUS | Status: AC
  Administered 2017-04-24: 380 mg via INTRAVENOUS
  Filled 2017-04-23: qty 9.5

## 2017-04-23 MED ORDER — CEFAZOLIN SODIUM-DEXTROSE 2-4 GM/100ML-% IV SOLN
2.0000 g | Freq: Three times a day (TID) | INTRAVENOUS | Status: AC
  Administered 2017-04-23 (×2): 2 g via INTRAVENOUS
  Filled 2017-04-23 (×2): qty 100

## 2017-04-23 MED ORDER — OXYCODONE HCL 5 MG PO CAPS: 5.0000 mg | ORAL_CAPSULE | ORAL | 0 refills | Status: DC | PRN

## 2017-04-23 MED ORDER — AMOXICILLIN-POT CLAVULANATE 875-125 MG PO TABS: 1.0000 | ORAL_TABLET | Freq: Two times a day (BID) | ORAL | 0 refills | Status: AC

## 2017-04-23 MED ORDER — GUAIFENESIN-DM 100-10 MG/5ML PO SYRP
5.0000 mL | ORAL_SOLUTION | ORAL | Status: DC | PRN
  Administered 2017-04-23: 5 mL via ORAL
  Filled 2017-04-23: qty 10

## 2017-04-23 MED ORDER — MORPHINE SULFATE (PF) 4 MG/ML IV SOLN
2.0000 mg | INTRAVENOUS | Status: DC | PRN
  Administered 2017-04-23 – 2017-04-24 (×2): 2 mg via INTRAVENOUS
  Filled 2017-04-23 (×2): qty 1

## 2017-04-23 NOTE — Plan of Care (Signed)
Pt is willing to ask questions about his treatment if he does not understand something.

## 2017-04-23 NOTE — Progress Notes (Signed)
Provided patient w/private duty care list also-informed of out of pocket cost, & independent decision-patient voiced understanding.

## 2017-04-23 NOTE — Discharge Summary (Signed)
[facility] Discharge Summary  Admit date: 04/22/2017  Discharge date and time: 04/23/17   Discharge to: Home  Discharge Service: Urology  Discharge Attending Physician:  Jeffie Pollock  Discharge  Diagnoses: IPP erosion  Secondary Diagnosis: Active Problems:   Erosion of penile prosthesis ([place])   OR Procedures: Procedure(s): EXPLANT OF MULTICOMPONENT PENILE PROSTHESIS 04/22/2017   Ancillary Procedures: None   Discharge Day Services: The patient was seen and examined by the Urology team both in the morning and immediately prior to discharge.  Vital signs and laboratory values were stable and within normal limits.  The physical exam was benign and unchanged and all surgical wounds were examined.  Discharge instructions were explained and all questions answered.  Subjective  No acute events overnight. Pain Controlled. No fever or chills.  Objective Patient Vitals for the past 8 hrs:  BP Temp Temp src Pulse Resp SpO2  04/23/17 0809 - - - - - 99 %  04/23/17 0646 (!) 141/56 97.6 F (36.4 C) Oral 61 20 100 %   Total I/O In: 320 [P.O.:320] Out: 740 [Urine:740]  General Appearance:        No acute distress Lungs:                       Normal work of breathing on room air Heart:                                Regular rate and rhythm Abdomen:                         Soft, non-tender, non-distended GU:    Scrotal wound packed  Extremities:                      Warm and well perfused   Hospital Course:  The patient underwent removal of eroded inflatable penile prosthesis on 04/22/2017.  The patient tolerated the procedure well, was extubated in the OR, and afterwards was taken to the PACU for routine post-surgical care. When stable the patient was transferred to the floor.   The patient did well postoperatively.  The patient's diet was slowly advanced and at the time of discharge was tolerating a regular diet.  The patient was discharged home 1 Day Post-Op, at which point was  tolerating a regular solid diet, was able to void spontaneously, have adequate pain control with P.O. pain medication, and could ambulate without difficulty. The patient will follow up with [place] for post op check.   Condition at Discharge: Improved  Discharge Medications:  Allergies as of 04/23/2017      Reactions   Actos [pioglitazone Hydrochloride] Other (See Comments)   "felt funny, drowsy, and weak":   Buprenorphine Hcl Nausea And Vomiting   Celebrex [celecoxib] Other (See Comments)   "felt funny"   Demerol Palpitations, Other (See Comments)   Increased BP   Meperidine Palpitations   Other reaction(s): Other (See Comments) Increased BP   Morphine And Related Nausea And Vomiting   Ciprofloxacin Other (See Comments)   arthralgia   Metformin Nausea And Vomiting   Zocor [simvastatin] Other (See Comments)   Makes pt very drowsy      Medication List    TAKE these medications   acetaminophen 500 MG tablet Commonly known as:  TYLENOL Take 1,000 mg by mouth every 6 (six) hours as needed for mild pain or  fever.   albuterol 108 (90 Base) MCG/ACT inhaler Commonly known as:  PROVENTIL HFA Inhale 2 puffs into the lungs every 4 (four) hours as needed for shortness of breath. Wheezing   amoxicillin-clavulanate 875-125 MG tablet Commonly known as:  AUGMENTIN Take 1 tablet 2 (two) times daily for 10 days by mouth.   fluticasone 50 MCG/ACT nasal spray Commonly known as:  FLONASE Place 2 sprays into both nostrils daily. Allergies   FLUZONE HIGH-DOSE 0.5 ML injection Generic drug:  Influenza vac split quadrivalent PF Inject 0.5 mLs once into the skin. 03/26/17   furosemide 40 MG tablet Commonly known as:  LASIX Take 1 tablet (40 mg total) by mouth daily. What changed:  when to take this   glipiZIDE 5 MG tablet Commonly known as:  GLUCOTROL Take 2.5 mg 2 (two) times daily by mouth.   guaiFENesin-dextromethorphan 100-10 MG/5ML syrup Commonly known as:  ROBITUSSIN DM Take 5 mLs  by mouth every 4 (four) hours as needed for cough.   HYDROcodone-acetaminophen 7.5-325 MG tablet Commonly known as:  NORCO Take 1 tablet every 6 (six) hours as needed by mouth for moderate pain or severe pain.   ipratropium-albuterol 0.5-2.5 (3) MG/3ML Soln Commonly known as:  DUONEB INHALE 1 vial (3 msl) via NEBULIZER 3 TIMES DAILY AND EVERY 4 HOURS AS NEEDED   nitroGLYCERIN 0.4 MG SL tablet Commonly known as:  NITROSTAT Place 0.4 mg under the tongue every 5 (five) minutes as needed for chest pain. Reported on 12/12/2015   oxycodone 5 MG capsule Commonly known as:  OXY-IR Take 1-2 capsules (5-10 mg total) every 4 (four) hours as needed by mouth.   potassium chloride 10 MEQ tablet Commonly known as:  K-DUR Take 2 tablets (20 mEq total) by mouth daily.   potassium chloride SA 20 MEQ tablet Commonly known as:  K-DUR,KLOR-CON Take 20 mEq daily by mouth.   ranitidine 300 MG tablet Commonly known as:  ZANTAC Take 300 mg by mouth at bedtime.   SYSTANE 0.4-0.3 % Soln Generic drug:  Polyethyl Glycol-Propyl Glycol Apply 2 drops to eye 3 (three) times daily as needed (dry eyes).

## 2017-04-23 NOTE — Progress Notes (Signed)
Urology Progress Note   1 Day Post-Op s/p IPP explant 2/2 erosion of left cylinder into urethra. Difficult catheterization requiring cystoscopy.  Subjective: NAEON. Pain controlled. On home O2 No fevers, continued on IV abx  Urine clear Scrotal wound packed, minimal drainage  Colostomy with output   Objective: Vital signs in last 24 hours: Temp:  [97.5 F (36.4 C)-98.9 F (37.2 C)] 97.6 F (36.4 C) (11/16 0646) Pulse Rate:  [59-84] 61 (11/16 0646) Resp:  [11-20] 20 (11/16 0646) BP: (132-191)/(56-81) 141/56 (11/16 0646) SpO2:  [92 %-100 %] 100 % (11/16 0646) Weight:  [93 kg (205 lb)] 93 kg (205 lb) (11/15 1115)  Intake/Output from previous day: 11/15 0701 - 11/16 0700 In: 500 [I.V.:500] Out: 95 [Urine:75; Blood:20] Intake/Output this shift: Total I/O In: -  Out: 740 [Urine:740]  Physical Exam:  General: Alert and oriented CV: RRR Lungs: Clear Abdomen: Soft, nontender, colostomy with output, Left inguinal incision dressed, penrose in place GU: Foley in place draining clear yellow urine. Scrotum packed with gauze  Ext: NT, No erythema  Lab Results: Recent Labs    04/23/17 0425  HGB 7.9*  HCT 24.8*   BMET No results for input(s): NA, K, CL, CO2, GLUCOSE, BUN, CREATININE, CALCIUM in the last 72 hours.   Studies/Results: No results found.  Assessment/Plan:  83 y.o. male s/p IPP explant.  Overall doing well post-op.   - Regular diet - [place] - Continue IV abx  - Consult to WOCN for dressing recommendations  - Consult to [facility] for home nursing to aid with dressing changes  - Left inguinal penrose in place - Dispo pending    Dispo: pending    LOS: 1 day   Alla Feeling, MD 04/23/2017, 7:25 AM

## 2017-04-23 NOTE — Care Management Note (Signed)
Case Management Note  Patient Details  Name: Eric [name] MRN: [id number] Date of Birth: 1926/09/09  Subjective/Objective:  83 y/o m admitted w/ erosion of penile prosthesis. From [place] apts-home,lives alone. Patient states he has used [facility] in past, & wants [facility]-HHRN already ordered-he wants daily nursing for his abd wound-explained to patient that [facility] will check w/his insurance & assess the medical need for how many home visits will be appropriate to instruct on wound care-patient voiced understanding.                Action/Plan: d/c home w/[facility].   Expected Discharge Date:                  Expected Discharge Plan:  [place]  In-House Referral:     Discharge planning Services  CM Consult  Post Acute Care Choice:    Choice offered to:  Patient  DME Arranged:    DME Agency:     HH Arranged:  RN [place] Agency:  [place]  Status of Service:  In process, will continue to follow  If discussed at Long Length of Stay Meetings, dates discussed:    Additional Comments:  Dessa Phi, RN 04/23/2017, 1:21 PM

## 2017-04-23 NOTE — Consult Note (Signed)
[place] Nurse wound consult note Reason for Consult:scrotal wound packing Wound type: incisional Pressure Injury POA: NA Measurement:7 cm incision with several pockets 3cm deep with several undermining areas to 3cm  Wound bed:red beefy wound bed Drainage (amount, consistency, odor) copious serosanguinous  Periwound:intact Dressing procedure/placement/frequency:Spent almost an hour with this patient. Severe pain even after IV pain med and waiting 30 min for it to begin working. Was able to get 3/4 of the roll of kerlix in with patient in severe pain. Covered with dry gauze and held on with disposable mesh panties. The Jock Strap he had on was wet. Removed and saved for pt to take home. I have provided nurses with orders for wet to dry packing dressing changes daily. We will not follow, but will remain available to this patient, to nursing, and the medical and/or surgical teams.  Please re-consult if we need to assist further.   Fara Olden, RN-C, WTA-C, [place] Wound Treatment Associate Ostomy Care Associate

## 2017-04-24 DIAGNOSIS — Z87891 Personal history of nicotine dependence: Secondary | ICD-10-CM | POA: Diagnosis not present

## 2017-04-24 DIAGNOSIS — T83420A Displacement of penile (implanted) prosthesis, initial encounter: Secondary | ICD-10-CM | POA: Diagnosis not present

## 2017-04-24 DIAGNOSIS — Z7982 Long term (current) use of aspirin: Secondary | ICD-10-CM | POA: Diagnosis not present

## 2017-04-24 DIAGNOSIS — Z933 Colostomy status: Secondary | ICD-10-CM | POA: Diagnosis not present

## 2017-04-24 DIAGNOSIS — Z8546 Personal history of malignant neoplasm of prostate: Secondary | ICD-10-CM | POA: Diagnosis not present

## 2017-04-24 DIAGNOSIS — J449 Chronic obstructive pulmonary disease, unspecified: Secondary | ICD-10-CM | POA: Diagnosis not present

## 2017-04-24 DIAGNOSIS — R32 Unspecified urinary incontinence: Secondary | ICD-10-CM | POA: Diagnosis not present

## 2017-04-24 LAB — GLUCOSE, CAPILLARY
GLUCOSE-CAPILLARY: 109 mg/dL — AB (ref 65–99)
Glucose-Capillary: 124 mg/dL — ABNORMAL HIGH (ref 65–99)

## 2017-04-24 NOTE — Discharge Instructions (Signed)
1.  Activity:  You are encouraged to ambulate frequently (about every hour during waking hours) to help prevent blood clots from forming in your legs or lungs.  However, you should not engage in any heavy lifting (> 10-15 lbs), strenuous activity, or straining. 2. Diet: You should advance your diet as instructed by your physician.  It will be normal to have some bloating, nausea, and abdominal discomfort intermittently. 3. Prescriptions:  You will be provided a prescription for pain medication to take as needed.  If your pain is not severe enough to require the prescription pain medication, you may take extra strength Tylenol instead which will have less side effects.  You should also take a prescribed stool softener to avoid straining with bowel movements as the prescription pain medication may constipate you. 4. Incisions: You may remove your dressing bandages 48 hours after surgery if not removed in the hospital.  You will either have some small staples or special tissue glue at each of the incision sites. Once the bandages are removed (if present), the incisions may stay open to air.  You may start showering (but not soaking or bathing in water) the 2nd day after surgery and the incisions simply need to be patted dry after the shower.  No additional care is needed. 5. What to call us about: You should call the office [phone]) if you develop fever > 101 or develop persistent vomiting.  Discharge home with foley catheter, leave foley in for 2 weeks will be discontinued in MD office (Dr. Jeffie Pollock)

## 2017-04-24 NOTE — Progress Notes (Signed)
RT placed patient on CPAP. Patient is tolerating well. 

## 2017-04-25 DIAGNOSIS — M5136 Other intervertebral disc degeneration, lumbar region: Secondary | ICD-10-CM | POA: Diagnosis not present

## 2017-04-25 DIAGNOSIS — I2510 Atherosclerotic heart disease of native coronary artery without angina pectoris: Secondary | ICD-10-CM | POA: Diagnosis not present

## 2017-04-25 DIAGNOSIS — Z87891 Personal history of nicotine dependence: Secondary | ICD-10-CM | POA: Diagnosis not present

## 2017-04-25 DIAGNOSIS — Z8701 Personal history of pneumonia (recurrent): Secondary | ICD-10-CM | POA: Diagnosis not present

## 2017-04-25 DIAGNOSIS — Z9981 Dependence on supplemental oxygen: Secondary | ICD-10-CM | POA: Diagnosis not present

## 2017-04-25 DIAGNOSIS — E119 Type 2 diabetes mellitus without complications: Secondary | ICD-10-CM | POA: Diagnosis not present

## 2017-04-25 DIAGNOSIS — Z7984 Long term (current) use of oral hypoglycemic drugs: Secondary | ICD-10-CM | POA: Diagnosis not present

## 2017-04-25 DIAGNOSIS — Z950 Presence of cardiac pacemaker: Secondary | ICD-10-CM | POA: Diagnosis not present

## 2017-04-25 DIAGNOSIS — Z955 Presence of coronary angioplasty implant and graft: Secondary | ICD-10-CM | POA: Diagnosis not present

## 2017-04-25 DIAGNOSIS — T83420D Displacement of penile (implanted) prosthesis, subsequent encounter: Secondary | ICD-10-CM | POA: Diagnosis not present

## 2017-04-25 DIAGNOSIS — K219 Gastro-esophageal reflux disease without esophagitis: Secondary | ICD-10-CM | POA: Diagnosis not present

## 2017-04-25 DIAGNOSIS — M1990 Unspecified osteoarthritis, unspecified site: Secondary | ICD-10-CM | POA: Diagnosis not present

## 2017-04-25 DIAGNOSIS — Z8546 Personal history of malignant neoplasm of prostate: Secondary | ICD-10-CM | POA: Diagnosis not present

## 2017-04-25 DIAGNOSIS — J449 Chronic obstructive pulmonary disease, unspecified: Secondary | ICD-10-CM | POA: Diagnosis not present

## 2017-04-25 DIAGNOSIS — G4730 Sleep apnea, unspecified: Secondary | ICD-10-CM | POA: Diagnosis not present

## 2017-04-25 DIAGNOSIS — Z87440 Personal history of urinary (tract) infections: Secondary | ICD-10-CM | POA: Diagnosis not present

## 2017-04-25 DIAGNOSIS — Z933 Colostomy status: Secondary | ICD-10-CM | POA: Diagnosis not present

## 2017-04-26 ENCOUNTER — Telehealth: Payer: Self-pay | Admitting: *Deleted

## 2017-04-26 DIAGNOSIS — G4730 Sleep apnea, unspecified: Secondary | ICD-10-CM | POA: Diagnosis not present

## 2017-04-26 DIAGNOSIS — E119 Type 2 diabetes mellitus without complications: Secondary | ICD-10-CM | POA: Diagnosis not present

## 2017-04-26 DIAGNOSIS — M5136 Other intervertebral disc degeneration, lumbar region: Secondary | ICD-10-CM | POA: Diagnosis not present

## 2017-04-26 DIAGNOSIS — T83420D Displacement of penile (implanted) prosthesis, subsequent encounter: Secondary | ICD-10-CM | POA: Diagnosis not present

## 2017-04-26 DIAGNOSIS — I2510 Atherosclerotic heart disease of native coronary artery without angina pectoris: Secondary | ICD-10-CM | POA: Diagnosis not present

## 2017-04-26 DIAGNOSIS — J449 Chronic obstructive pulmonary disease, unspecified: Secondary | ICD-10-CM | POA: Diagnosis not present

## 2017-04-26 NOTE — Telephone Encounter (Signed)
Pt was on TCM list admitted 04/22/17 underwent removal of eroded inflatable penile prosthesis on 04/22/2017.  The patient tolerated the procedure well, was extubated in the OR, and afterwards was taken to the PACU for routine post-surgical care. Pt was D/C 04/23/17  And will f/u with his urologist.../lmb

## 2017-04-27 ENCOUNTER — Encounter: Payer: Self-pay | Admitting: Internal Medicine

## 2017-04-27 DIAGNOSIS — J449 Chronic obstructive pulmonary disease, unspecified: Secondary | ICD-10-CM | POA: Diagnosis not present

## 2017-04-27 DIAGNOSIS — I2510 Atherosclerotic heart disease of native coronary artery without angina pectoris: Secondary | ICD-10-CM | POA: Diagnosis not present

## 2017-04-27 DIAGNOSIS — G4730 Sleep apnea, unspecified: Secondary | ICD-10-CM | POA: Diagnosis not present

## 2017-04-27 DIAGNOSIS — T83420D Displacement of penile (implanted) prosthesis, subsequent encounter: Secondary | ICD-10-CM | POA: Diagnosis not present

## 2017-04-27 DIAGNOSIS — E119 Type 2 diabetes mellitus without complications: Secondary | ICD-10-CM | POA: Diagnosis not present

## 2017-04-27 DIAGNOSIS — M5136 Other intervertebral disc degeneration, lumbar region: Secondary | ICD-10-CM | POA: Diagnosis not present

## 2017-04-27 LAB — AEROBIC/ANAEROBIC CULTURE (SURGICAL/DEEP WOUND)

## 2017-04-27 LAB — AEROBIC/ANAEROBIC CULTURE W GRAM STAIN (SURGICAL/DEEP WOUND)

## 2017-04-27 NOTE — Care Management Note (Signed)
Case Management Note  Patient Details  Name: Eric [name] MRN: [id number] Date of Birth: [birth year]  Subjective/Objective:    Please see previous NCM notes                Action/Plan: 04/27/2017 Contacted [facility] intake and referral was received. Pt was seen by [facility] on 04/26/2017.   Expected Discharge Date:  04/24/17               Expected Discharge Plan:  [place]  In-House Referral:  NA  Discharge planning Services  CM Consult  Post Acute Care Choice:  NA Choice offered to:  Patient  DME Arranged:  N/A DME Agency:  NA  HH Arranged:  RN [place] Agency:  [place]  Status of Service:  Completed, signed off  If discussed at [place] of Stay Meetings, dates discussed:    Additional Comments:  Erenest Rasher, RN 04/27/2017, 10:14 AM

## 2017-04-28 DIAGNOSIS — M5136 Other intervertebral disc degeneration, lumbar region: Secondary | ICD-10-CM | POA: Diagnosis not present

## 2017-04-28 DIAGNOSIS — E119 Type 2 diabetes mellitus without complications: Secondary | ICD-10-CM | POA: Diagnosis not present

## 2017-04-28 DIAGNOSIS — I2510 Atherosclerotic heart disease of native coronary artery without angina pectoris: Secondary | ICD-10-CM | POA: Diagnosis not present

## 2017-04-28 DIAGNOSIS — T83420D Displacement of penile (implanted) prosthesis, subsequent encounter: Secondary | ICD-10-CM | POA: Diagnosis not present

## 2017-04-28 DIAGNOSIS — G4730 Sleep apnea, unspecified: Secondary | ICD-10-CM | POA: Diagnosis not present

## 2017-04-28 DIAGNOSIS — J449 Chronic obstructive pulmonary disease, unspecified: Secondary | ICD-10-CM | POA: Diagnosis not present

## 2017-04-30 DIAGNOSIS — E119 Type 2 diabetes mellitus without complications: Secondary | ICD-10-CM | POA: Diagnosis not present

## 2017-04-30 DIAGNOSIS — G4730 Sleep apnea, unspecified: Secondary | ICD-10-CM | POA: Diagnosis not present

## 2017-04-30 DIAGNOSIS — I2510 Atherosclerotic heart disease of native coronary artery without angina pectoris: Secondary | ICD-10-CM | POA: Diagnosis not present

## 2017-04-30 DIAGNOSIS — M5136 Other intervertebral disc degeneration, lumbar region: Secondary | ICD-10-CM | POA: Diagnosis not present

## 2017-04-30 DIAGNOSIS — T83420D Displacement of penile (implanted) prosthesis, subsequent encounter: Secondary | ICD-10-CM | POA: Diagnosis not present

## 2017-04-30 DIAGNOSIS — J449 Chronic obstructive pulmonary disease, unspecified: Secondary | ICD-10-CM | POA: Diagnosis not present

## 2017-05-03 DIAGNOSIS — I2510 Atherosclerotic heart disease of native coronary artery without angina pectoris: Secondary | ICD-10-CM | POA: Diagnosis not present

## 2017-05-03 DIAGNOSIS — T83420D Displacement of penile (implanted) prosthesis, subsequent encounter: Secondary | ICD-10-CM | POA: Diagnosis not present

## 2017-05-03 DIAGNOSIS — G4730 Sleep apnea, unspecified: Secondary | ICD-10-CM | POA: Diagnosis not present

## 2017-05-03 DIAGNOSIS — E119 Type 2 diabetes mellitus without complications: Secondary | ICD-10-CM | POA: Diagnosis not present

## 2017-05-03 DIAGNOSIS — J449 Chronic obstructive pulmonary disease, unspecified: Secondary | ICD-10-CM | POA: Diagnosis not present

## 2017-05-03 DIAGNOSIS — M5136 Other intervertebral disc degeneration, lumbar region: Secondary | ICD-10-CM | POA: Diagnosis not present

## 2017-05-05 DIAGNOSIS — J449 Chronic obstructive pulmonary disease, unspecified: Secondary | ICD-10-CM | POA: Diagnosis not present

## 2017-05-05 DIAGNOSIS — I2510 Atherosclerotic heart disease of native coronary artery without angina pectoris: Secondary | ICD-10-CM | POA: Diagnosis not present

## 2017-05-05 DIAGNOSIS — M5136 Other intervertebral disc degeneration, lumbar region: Secondary | ICD-10-CM | POA: Diagnosis not present

## 2017-05-05 DIAGNOSIS — T83420D Displacement of penile (implanted) prosthesis, subsequent encounter: Secondary | ICD-10-CM | POA: Diagnosis not present

## 2017-05-05 DIAGNOSIS — E119 Type 2 diabetes mellitus without complications: Secondary | ICD-10-CM | POA: Diagnosis not present

## 2017-05-05 DIAGNOSIS — G4730 Sleep apnea, unspecified: Secondary | ICD-10-CM | POA: Diagnosis not present

## 2017-05-06 DIAGNOSIS — T83420A Displacement of penile (implanted) prosthesis, initial encounter: Secondary | ICD-10-CM | POA: Diagnosis not present

## 2017-05-07 DIAGNOSIS — M5136 Other intervertebral disc degeneration, lumbar region: Secondary | ICD-10-CM | POA: Diagnosis not present

## 2017-05-07 DIAGNOSIS — G4730 Sleep apnea, unspecified: Secondary | ICD-10-CM | POA: Diagnosis not present

## 2017-05-07 DIAGNOSIS — I2510 Atherosclerotic heart disease of native coronary artery without angina pectoris: Secondary | ICD-10-CM | POA: Diagnosis not present

## 2017-05-07 DIAGNOSIS — J449 Chronic obstructive pulmonary disease, unspecified: Secondary | ICD-10-CM | POA: Diagnosis not present

## 2017-05-07 DIAGNOSIS — E119 Type 2 diabetes mellitus without complications: Secondary | ICD-10-CM | POA: Diagnosis not present

## 2017-05-07 DIAGNOSIS — T83420D Displacement of penile (implanted) prosthesis, subsequent encounter: Secondary | ICD-10-CM | POA: Diagnosis not present

## 2017-05-08 ENCOUNTER — Emergency Department (HOSPITAL_COMMUNITY)
Admission: EM | Admit: 2017-05-08 | Discharge: 2017-05-08 | Disposition: A | Discharge status: Home / Self Care | Payer: Medicare Other | Attending: Emergency Medicine | Admitting: Emergency Medicine

## 2017-05-08 ENCOUNTER — Encounter (HOSPITAL_COMMUNITY): Payer: Self-pay | Admitting: Nurse Practitioner

## 2017-05-08 ENCOUNTER — Other Ambulatory Visit: Payer: Self-pay

## 2017-05-08 DIAGNOSIS — Z87891 Personal history of nicotine dependence: Secondary | ICD-10-CM | POA: Insufficient documentation

## 2017-05-08 DIAGNOSIS — I5033 Acute on chronic diastolic (congestive) heart failure: Secondary | ICD-10-CM | POA: Diagnosis not present

## 2017-05-08 DIAGNOSIS — J449 Chronic obstructive pulmonary disease, unspecified: Secondary | ICD-10-CM | POA: Insufficient documentation

## 2017-05-08 DIAGNOSIS — Z7984 Long term (current) use of oral hypoglycemic drugs: Secondary | ICD-10-CM | POA: Insufficient documentation

## 2017-05-08 DIAGNOSIS — Z79899 Other long term (current) drug therapy: Secondary | ICD-10-CM | POA: Diagnosis not present

## 2017-05-08 DIAGNOSIS — Z950 Presence of cardiac pacemaker: Secondary | ICD-10-CM | POA: Diagnosis not present

## 2017-05-08 DIAGNOSIS — E119 Type 2 diabetes mellitus without complications: Secondary | ICD-10-CM | POA: Insufficient documentation

## 2017-05-08 DIAGNOSIS — Y828 Other medical devices associated with adverse incidents: Secondary | ICD-10-CM | POA: Diagnosis not present

## 2017-05-08 DIAGNOSIS — Z96641 Presence of right artificial hip joint: Secondary | ICD-10-CM | POA: Insufficient documentation

## 2017-05-08 DIAGNOSIS — I110 Hypertensive heart disease with heart failure: Secondary | ICD-10-CM | POA: Diagnosis not present

## 2017-05-08 DIAGNOSIS — I2510 Atherosclerotic heart disease of native coronary artery without angina pectoris: Secondary | ICD-10-CM | POA: Diagnosis not present

## 2017-05-08 DIAGNOSIS — Z8546 Personal history of malignant neoplasm of prostate: Secondary | ICD-10-CM | POA: Insufficient documentation

## 2017-05-08 DIAGNOSIS — T83091A Other mechanical complication of indwelling urethral catheter, initial encounter: Secondary | ICD-10-CM | POA: Diagnosis not present

## 2017-05-08 DIAGNOSIS — T839XXA Unspecified complication of genitourinary prosthetic device, implant and graft, initial encounter: Secondary | ICD-10-CM | POA: Diagnosis not present

## 2017-05-08 NOTE — ED Provider Notes (Signed)
[facility] DEPT Provider Note   CSN: [id number] Arrival date & time: 05/08/17  0736     History   Chief Complaint Chief Complaint  Patient presents with  . Catheter Leaking    Leg Bag    HPI Eric [name] is a 83 y.o. male.  HPI   83 year old male presents with concern for leaking from his catheter bag.  Patient noted the leaking last night.  Denies abdominal pain, fevers, vomiting, chest pain shortness of breath or other symptoms. No difficulty with flow of foley. Nurse typically comes out to change bag for him.  Past Medical History:  Diagnosis Date  . ALLERGIC RHINITIS   . ANEMIA-NOS   . AORTIC SCLEROSIS   . Asthma   . CARDIOMYOPATHY, ISCHEMIC   . CAROTID BRUIT, RIGHT 02/27/2008  . Cataract    surgery  . CML (chronic myeloid leukemia) ([place]) 06/26/2015  . COPD   . CORONARY ARTERY DISEASE    a. s/p CABG in 1995 b. DES in 2008, 2009, and most recent in 2012 with DES to SVG-OM  . DIABETES MELLITUS-TYPE II    diet controlled  . Diastolic dysfunction, Grade 1 11/24/2014  . Diverticulitis of colon with perforation 11/22/2014  . Diverticulosis   . GERD   . HIATAL HERNIA   . Hx of echocardiogram    Echo (9/15):  Mild LVH, EF 50-55%, no RWMA, Gr 1 DD, MAC, mild LAE.  [name] HYPERLIPIDEMIA   . HYPERTENSION   . Hyponatremia 11/22/2014  . IBS (irritable bowel syndrome)   . LACTOSE INTOLERANCE   . OA (osteoarthritis)   . OBESITY   . On home oxygen therapy    "3L all the time" (10/12/2016)  . Partial small bowel obstruction ([place])   . PERIPHERAL VASCULAR DISEASE   . Primary hyperparathyroidism ([place])    Lab Results Component Value Date  PTH 150.7* 02/13/2013  CALCIUM 11.0* 02/13/2013  CAION 1.21 03/15/2008    . Prostate cancer ([place])    seed implants 2004  . SICK SINUS/ TACHY-BRADY SYNDROME 09/2007   s/p PPM st jud[employer]  . Sleep apnea   . SMALL BOWEL OBSTRUCTION 04/18/2009   Qualifier: History of  By: Asa Lente MD, Jannifer Rodney Symptomatic  diverticulosis 01/18/2009   Qualifier: Diagnosis of  By: Shane Crutch, Amy S     Patient Active Problem List   Diagnosis Date Noted  . Erosion of penile prosthesis ([place]) 04/22/2017  . Bilateral pleural effusion 10/12/2016  . Chronic respiratory failure with hypoxia ([place]) 10/12/2016  . Goals of care, counseling/discussion 08/04/2016  . Anxiety state Jan 29, 202018  . Anemia in neoplastic disease 12/12/2015  . Acquired pancytopenia ([place]) 10/28/2015  . Tachy-brady syndrome ([place]) 07/10/2015  . Bilateral carotid artery stenosis 07/10/2015  . Essential hypertension 07/10/2015  . CML (chronic myeloid leukemia) ([place]) 06/26/2015  . Myeloproliferative neoplasm ([place]) 06/25/2015  . Back pain 12/10/2014  . Obesity (BMI 30-39.9) 12/08/2014  . Hartmann's pouch of intestine ([place]) 12/08/2014  . Acute on chronic diastolic (congestive) heart failure ([place]) 11/24/2014  . GERD (gastroesophageal reflux disease) 03/27/2014  . Generalized weakness 02/08/2014  . Obstructive sleep apnea 04/11/2011  . Type 2 diabetes mellitus, controlled ([place]) 01/18/2009  . Dyslipidemia 01/18/2009  . Hypertensive heart disease with CHF ([place]) 01/18/2009  . Coronary atherosclerosis 01/18/2009  . GERD 01/18/2009  . COPD mixed type ([place]) 12/27/2006    Past Surgical History:  Procedure Laterality Date  . BACK SURGERY    . Bilateral cataracts    .  COLON RESECTION N/A 11/28/2014   Procedure: EXPLORATORY LAPAROTOMY, SIGMOID COLECTOMY WITH COLOSTOMY;  Surgeon: Jackolyn Confer, MD;  Location: [facility];  Service: General;  Laterality: N/A;  . COLON SURGERY    . COLONOSCOPY    . CORONARY ARTERY BYPASS GRAFT    . ESOPHAGOGASTRODUODENOSCOPY  multiple  . FLEXIBLE SIGMOIDOSCOPY N/A 09/22/2013   Procedure: FLEXIBLE SIGMOIDOSCOPY;  Surgeon: Gatha Mayer, MD;  Location: [facility] ENDOSCOPY;  Service: Endoscopy;  Laterality: N/A;  . INGUINAL HERNIA REPAIR Bilateral   . LUMBAR [place] SURGERY  12/2008  . PACEMAKER INSERTION     DDD/[employer]          Last interrogation 2/13  on chart     Pacemaker guideline order Dr Tamala Julian on chart  . Partial small bowel obstruction  2009  . PENILE PROSTHESIS PLACEMENT    . PTCA  2008, 2009, 2012   with DES  . REMOVAL OF PENILE PROSTHESIS N/A 04/22/2017   Procedure: EXPLANT OF MULTICOMPONENT PENILE PROSTHESIS;  Surgeon: Irine Seal, MD;  Location: [facility];  Service: Urology;  Laterality: N/A;  . TOTAL HIP ARTHROPLASTY  08/21/2011   Procedure: TOTAL HIP ARTHROPLASTY;  Surgeon: Johnn Hai, MD;  Location: [facility];  Service: Orthopedics;  Laterality: Right;       Home Medications    Prior to Admission medications   Medication Sig Start Date End Date Taking? Authorizing Provider  acetaminophen (TYLENOL) 500 MG tablet Take 1,000 mg by mouth every 6 (six) hours as needed for mild pain or fever.    [provider]  albuterol (PROVENTIL HFA) 108 (90 Base) MCG/ACT inhaler Inhale 2 puffs into the lungs every 4 (four) hours as needed for shortness of breath. Wheezing 06/09/16   [name], MD  fluticasone Asencion Islam) 50 MCG/ACT nasal spray Place 2 sprays into both nostrils daily. Allergies 08/01/14   [name], MD  FLUZONE HIGH-DOSE 0.5 ML injection Inject 0.5 mLs once into the skin. 03/26/17 03/26/17   [provider]  furosemide (LASIX) 40 MG tablet Take 1 tablet (40 mg total) by mouth daily. Patient taking differently: Take 40 mg 2 (two) times daily by mouth.  03/12/17   [name], MD  glipiZIDE (GLUCOTROL) 5 MG tablet Take 2.5 mg 2 (two) times daily by mouth.    [provider]  guaiFENesin-dextromethorphan (ROBITUSSIN DM) 100-10 MG/5ML syrup Take 5 mLs by mouth every 4 (four) hours as needed for cough. 10/15/16   Lavina Hamman, MD  HYDROcodone-acetaminophen (NORCO) 7.5-325 MG tablet Take 1 tablet every 6 (six) hours as needed by mouth for moderate pain or severe pain. 04/22/17   Debbrah Alar, PA-C  ipratropium-albuterol (DUONEB) 0.5-2.5 (3)  MG/3ML SOLN INHALE 1 vial (3 msl) via NEBULIZER 3 TIMES DAILY AND EVERY 4 HOURS AS NEEDED 02/17/17   [provider]  nitroGLYCERIN (NITROSTAT) 0.4 MG SL tablet Place 0.4 mg under the tongue every 5 (five) minutes as needed for chest pain. Reported on 12/12/2015    [provider]  oxycodone (OXY-IR) 5 MG capsule Take 1-2 capsules (5-10 mg total) every 4 (four) hours as needed by mouth. 04/23/17   Narang, Gopal L, MD  Polyethyl Glycol-Propyl Glycol (SYSTANE) 0.4-0.3 % SOLN Apply 2 drops to eye 3 (three) times daily as needed (dry eyes).     [provider]  potassium chloride (K-DUR) 10 MEQ tablet Take 2 tablets (20 mEq total) by mouth daily. 10/15/16   Lavina Hamman, MD  potassium chloride SA (  K-DUR,KLOR-CON) 20 MEQ tablet Take 20 mEq daily by mouth.    [provider]  ranitidine (ZANTAC) 300 MG tablet Take 300 mg by mouth at bedtime.     [provider]    Family History Family History  Problem Relation Age of Onset  . Hypertension Mother   . Cancer Mother   . Heart attack Unknown   . Heart attack Unknown   . Heart attack Brother   . Colon cancer Neg Hx   . Stroke Neg Hx     Social History Social History   Tobacco Use  . Smoking status: Former Smoker    Packs/day: 1.00    Years: 25.00    Pack years: 25.00    Types: Cigarettes    Last attempt to quit: 06/08/1994    Years since quitting: 22.9  . Smokeless tobacco: Never Used  Substance Use Topics  . Alcohol use: No    Alcohol/week: 0.0 oz  . Drug use: No     Allergies   Actos [pioglitazone hydrochloride]; Buprenorphine hcl; Celebrex [celecoxib]; Demerol; Meperidine; Morphine and related; Ciprofloxacin; Metformin; and Zocor [simvastatin]   Review of Systems Review of Systems  Constitutional: Negative for fever.  Respiratory: Negative for cough and shortness of breath.   Cardiovascular: Negative for chest pain.  Gastrointestinal: Negative for abdominal pain, nausea and vomiting.      Physical Exam Updated Vital Signs BP (!) 153/80 (BP Location: Left Arm)   Pulse 64   Temp 97.7 F (36.5 C) (Oral)   Resp 20   Ht 5' 6"  (1.676 m)   Wt 93 kg (205 lb)   SpO2 99%   BMI 33.09 kg/m   Physical Exam  Constitutional: He appears well-developed and well-nourished. No distress.  Pulmonary/Chest: Effort normal. No respiratory distress.  Abdominal: Soft. He exhibits no distension. There is no tenderness.  Ostomy bag in place  Genitourinary:  Genitourinary Comments: Cathter bag leaking with hole  Skin: He is not diaphoretic.     ED Treatments / Results  Labs (all labs ordered are listed, but only abnormal results are displayed) Labs Reviewed - No data to display  EKG  EKG Interpretation None       Radiology No results found.  Procedures Procedures (including critical care time)  Medications Ordered in ED Medications - No data to display   Initial Impression / Assessment and Plan / ED Course  I have reviewed the triage vital signs and the nursing notes.  Pertinent labs & imaging results that were available during my care of the patient were reviewed by me and considered in my medical decision making (see chart for details).      83 year old male presents with concern for leaking from his catheter bag. Denies other concerns. Hole found in catheter bag. Bag exchanged by nursing staff. Patient discharged in stable condition with understanding of reasons to return.   Final Clinical Impressions(s) / ED Diagnoses   Final diagnoses:  Complication of Foley catheter, initial encounter [facility])    ED Discharge Orders    None       Gareth Morgan, MD 05/08/17 [phone]

## 2017-05-08 NOTE — ED Triage Notes (Signed)
Patient lives alone and states he woke up this morning around 6 am and notice his bed was wet. He had urine in the bag however there was urine around the area the bag was. He stated he watched it for some time and realized that the bottom of the bag was leaking and not holding all of the urine. He placed a ziplock back around his leg bag which is now collecting the urine. Patient denies pain or discomfort. States he called [facility] and they suggested he come in to have bag changed vs waiting until Monday. Patient states " I just need a new bag"

## 2017-05-08 NOTE — ED Notes (Signed)
Leg bag was changed. Pt given extra leg bag.

## 2017-05-08 NOTE — ED Notes (Signed)
E sign is not working.

## 2017-05-10 DIAGNOSIS — M5136 Other intervertebral disc degeneration, lumbar region: Secondary | ICD-10-CM | POA: Diagnosis not present

## 2017-05-10 DIAGNOSIS — E119 Type 2 diabetes mellitus without complications: Secondary | ICD-10-CM | POA: Diagnosis not present

## 2017-05-10 DIAGNOSIS — J449 Chronic obstructive pulmonary disease, unspecified: Secondary | ICD-10-CM | POA: Diagnosis not present

## 2017-05-10 DIAGNOSIS — I2510 Atherosclerotic heart disease of native coronary artery without angina pectoris: Secondary | ICD-10-CM | POA: Diagnosis not present

## 2017-05-10 DIAGNOSIS — G4730 Sleep apnea, unspecified: Secondary | ICD-10-CM | POA: Diagnosis not present

## 2017-05-10 DIAGNOSIS — T83420D Displacement of penile (implanted) prosthesis, subsequent encounter: Secondary | ICD-10-CM | POA: Diagnosis not present

## 2017-05-12 DIAGNOSIS — M5136 Other intervertebral disc degeneration, lumbar region: Secondary | ICD-10-CM | POA: Diagnosis not present

## 2017-05-12 DIAGNOSIS — T83420D Displacement of penile (implanted) prosthesis, subsequent encounter: Secondary | ICD-10-CM | POA: Diagnosis not present

## 2017-05-12 DIAGNOSIS — J449 Chronic obstructive pulmonary disease, unspecified: Secondary | ICD-10-CM | POA: Diagnosis not present

## 2017-05-12 DIAGNOSIS — E119 Type 2 diabetes mellitus without complications: Secondary | ICD-10-CM | POA: Diagnosis not present

## 2017-05-12 DIAGNOSIS — I2510 Atherosclerotic heart disease of native coronary artery without angina pectoris: Secondary | ICD-10-CM | POA: Diagnosis not present

## 2017-05-12 DIAGNOSIS — G4730 Sleep apnea, unspecified: Secondary | ICD-10-CM | POA: Diagnosis not present

## 2017-05-12 DIAGNOSIS — T83420A Displacement of penile (implanted) prosthesis, initial encounter: Secondary | ICD-10-CM | POA: Diagnosis not present

## 2017-05-13 DIAGNOSIS — G4730 Sleep apnea, unspecified: Secondary | ICD-10-CM | POA: Diagnosis not present

## 2017-05-13 DIAGNOSIS — T83420D Displacement of penile (implanted) prosthesis, subsequent encounter: Secondary | ICD-10-CM | POA: Diagnosis not present

## 2017-05-13 DIAGNOSIS — E119 Type 2 diabetes mellitus without complications: Secondary | ICD-10-CM | POA: Diagnosis not present

## 2017-05-13 DIAGNOSIS — I2510 Atherosclerotic heart disease of native coronary artery without angina pectoris: Secondary | ICD-10-CM | POA: Diagnosis not present

## 2017-05-13 DIAGNOSIS — J449 Chronic obstructive pulmonary disease, unspecified: Secondary | ICD-10-CM | POA: Diagnosis not present

## 2017-05-13 DIAGNOSIS — M5136 Other intervertebral disc degeneration, lumbar region: Secondary | ICD-10-CM | POA: Diagnosis not present

## 2017-05-17 DIAGNOSIS — T83420D Displacement of penile (implanted) prosthesis, subsequent encounter: Secondary | ICD-10-CM | POA: Diagnosis not present

## 2017-05-17 DIAGNOSIS — I2510 Atherosclerotic heart disease of native coronary artery without angina pectoris: Secondary | ICD-10-CM | POA: Diagnosis not present

## 2017-05-17 DIAGNOSIS — E119 Type 2 diabetes mellitus without complications: Secondary | ICD-10-CM | POA: Diagnosis not present

## 2017-05-17 DIAGNOSIS — G4730 Sleep apnea, unspecified: Secondary | ICD-10-CM | POA: Diagnosis not present

## 2017-05-17 DIAGNOSIS — J449 Chronic obstructive pulmonary disease, unspecified: Secondary | ICD-10-CM | POA: Diagnosis not present

## 2017-05-17 DIAGNOSIS — M5136 Other intervertebral disc degeneration, lumbar region: Secondary | ICD-10-CM | POA: Diagnosis not present

## 2017-05-19 DIAGNOSIS — E119 Type 2 diabetes mellitus without complications: Secondary | ICD-10-CM | POA: Diagnosis not present

## 2017-05-19 DIAGNOSIS — T83420D Displacement of penile (implanted) prosthesis, subsequent encounter: Secondary | ICD-10-CM | POA: Diagnosis not present

## 2017-05-19 DIAGNOSIS — G4730 Sleep apnea, unspecified: Secondary | ICD-10-CM | POA: Diagnosis not present

## 2017-05-19 DIAGNOSIS — I2510 Atherosclerotic heart disease of native coronary artery without angina pectoris: Secondary | ICD-10-CM | POA: Diagnosis not present

## 2017-05-19 DIAGNOSIS — M5136 Other intervertebral disc degeneration, lumbar region: Secondary | ICD-10-CM | POA: Diagnosis not present

## 2017-05-19 DIAGNOSIS — J449 Chronic obstructive pulmonary disease, unspecified: Secondary | ICD-10-CM | POA: Diagnosis not present

## 2017-05-21 DIAGNOSIS — G4730 Sleep apnea, unspecified: Secondary | ICD-10-CM | POA: Diagnosis not present

## 2017-05-21 DIAGNOSIS — T83420D Displacement of penile (implanted) prosthesis, subsequent encounter: Secondary | ICD-10-CM | POA: Diagnosis not present

## 2017-05-21 DIAGNOSIS — E119 Type 2 diabetes mellitus without complications: Secondary | ICD-10-CM | POA: Diagnosis not present

## 2017-05-21 DIAGNOSIS — J449 Chronic obstructive pulmonary disease, unspecified: Secondary | ICD-10-CM | POA: Diagnosis not present

## 2017-05-21 DIAGNOSIS — M5136 Other intervertebral disc degeneration, lumbar region: Secondary | ICD-10-CM | POA: Diagnosis not present

## 2017-05-21 DIAGNOSIS — I2510 Atherosclerotic heart disease of native coronary artery without angina pectoris: Secondary | ICD-10-CM | POA: Diagnosis not present

## 2017-05-24 ENCOUNTER — Telehealth: Payer: Self-pay | Admitting: Internal Medicine

## 2017-05-24 ENCOUNTER — Inpatient Hospital Stay: Payer: Medicare Other | Admitting: Internal Medicine

## 2017-05-24 ENCOUNTER — Ambulatory Visit: Payer: Self-pay

## 2017-05-24 DIAGNOSIS — M5136 Other intervertebral disc degeneration, lumbar region: Secondary | ICD-10-CM | POA: Diagnosis not present

## 2017-05-24 DIAGNOSIS — G4730 Sleep apnea, unspecified: Secondary | ICD-10-CM | POA: Diagnosis not present

## 2017-05-24 DIAGNOSIS — T83420D Displacement of penile (implanted) prosthesis, subsequent encounter: Secondary | ICD-10-CM | POA: Diagnosis not present

## 2017-05-24 DIAGNOSIS — E119 Type 2 diabetes mellitus without complications: Secondary | ICD-10-CM | POA: Diagnosis not present

## 2017-05-24 DIAGNOSIS — J449 Chronic obstructive pulmonary disease, unspecified: Secondary | ICD-10-CM | POA: Diagnosis not present

## 2017-05-24 DIAGNOSIS — I2510 Atherosclerotic heart disease of native coronary artery without angina pectoris: Secondary | ICD-10-CM | POA: Diagnosis not present

## 2017-05-24 NOTE — Telephone Encounter (Signed)
This is okay.

## 2017-05-24 NOTE — Telephone Encounter (Signed)
Copied from [place]. Topic: Quick Communication - See Telephone Encounter >> May 24, 2017  1:43 PM Hewitt Shorts wrote: CRM for notification. See Telephone encounter for: home health nurse is calling to let us know that pt blood pressures have  been 150/70 14th 160/64 12th 160/60 10th 150/62 pt also has an appt on [place] if any questions [phone]  05/24/17.

## 2017-05-24 NOTE — Telephone Encounter (Signed)
  Reason for Disposition . [3] Systolic BP  >= 009 OR Diastolic >= 90 AND [2] taking BP medications  Answer Assessment - Initial Assessment Questions 1. BLOOD PRESSURE: "What is the blood pressure?" "Did you take at least two measurements 5 minutes apart?"     160/60 2. ONSET: "When did you take your blood pressure?"     This morning 3. HOW: "How did you obtain the blood pressure?" (e.g., visiting nurse, automatic home BP monitor)     Home health nurse 4. HISTORY: "Do you have a history of high blood pressure?"     Yes 5. MEDICATIONS: "Are you taking any medications for blood pressure?" "Have you missed any doses recently?"     No 6. OTHER SYMPTOMS: "Do you have any symptoms?" (e.g., headache, chest pain, blurred vision, difficulty breathing, weakness)     No other symptoms 7. PREGNANCY: "Is there any chance you are pregnant?" "When was your last menstrual period?"     No  Protocols used: HIGH BLOOD PRESSURE-A-AH Pt. States he no symptoms and he has an appointment for this week. Instructed to call back if starts to have symptoms. Verbalizes undesrtanding.

## 2017-05-26 ENCOUNTER — Inpatient Hospital Stay: Payer: Medicare Other | Admitting: Internal Medicine

## 2017-05-27 ENCOUNTER — Ambulatory Visit: Payer: Self-pay

## 2017-05-27 DIAGNOSIS — T83420D Displacement of penile (implanted) prosthesis, subsequent encounter: Secondary | ICD-10-CM | POA: Diagnosis not present

## 2017-05-27 DIAGNOSIS — E119 Type 2 diabetes mellitus without complications: Secondary | ICD-10-CM | POA: Diagnosis not present

## 2017-05-27 DIAGNOSIS — I2510 Atherosclerotic heart disease of native coronary artery without angina pectoris: Secondary | ICD-10-CM | POA: Diagnosis not present

## 2017-05-27 DIAGNOSIS — J449 Chronic obstructive pulmonary disease, unspecified: Secondary | ICD-10-CM | POA: Diagnosis not present

## 2017-05-27 DIAGNOSIS — M5136 Other intervertebral disc degeneration, lumbar region: Secondary | ICD-10-CM | POA: Diagnosis not present

## 2017-05-27 DIAGNOSIS — G4730 Sleep apnea, unspecified: Secondary | ICD-10-CM | POA: Diagnosis not present

## 2017-05-27 NOTE — Telephone Encounter (Signed)
Beth from [place] called to state that pt is having mild dizziness. His BP is 150-160/60. HR 76. Pt states that he is not as dizzy now. Per nurse pt states that the dizziness began @ 1100 2 hours after taking BP med. Appt made for 05/31/17 @ 1545.  Reason for Disposition . [1] MODERATE dizziness (e.g., interferes with normal activities) AND [2] has NOT been evaluated by physician for this  (Exception: dizziness caused by heat exposure, sudden standing, or poor fluid intake)  Answer Assessment - Initial Assessment Questions 1. DESCRIPTION: "Describe your dizziness."     lightheaded 2. LIGHTHEADED: "Do you feel lightheaded?" (e.g., somewhat faint, woozy, weak upon standing)     Yes  3. VERTIGO: "Do you feel like either you or the room is spinning or tilting?" (i.e. vertigo)     no 4. SEVERITY: "How bad is it?"  "Do you feel like you are going to faint?" "Can you stand and walk?"   - MILD - walking normally   - MODERATE - interferes with normal activities (e.g., work, school)    - SEVERE - unable to stand, requires support to walk, feels like passing out now.      Mild right now 5. ONSET:  "When did the dizziness begin?"     This am 1100 6. AGGRAVATING FACTORS: "Does anything make it worse?" (e.g., standing, change in head position)     no 7. HEART RATE: "Can you tell me your heart rate?" "How many beats in 15 seconds?"  (Note: not all patients can do this)       76 8. CAUSE: "What do you think is causing the dizziness?"     Hypertension  9. RECURRENT SYMPTOM: "Have you had dizziness before?" If so, ask: "When was the last time?" "What happened that time?"     Yes orthostatic hypotension mild per nurse 10. OTHER SYMPTOMS: "Do you have any other symptoms?" (e.g., fever, chest pain, vomiting, diarrhea, bleeding)       no 11. PREGNANCY: "Is there any chance you are pregnant?" "When was your last menstrual period?"      n/a  Protocols used: DIZZINESS [facility]

## 2017-05-28 ENCOUNTER — Encounter: Payer: Self-pay | Admitting: Internal Medicine

## 2017-05-28 ENCOUNTER — Ambulatory Visit (INDEPENDENT_AMBULATORY_CARE_PROVIDER_SITE_OTHER): Payer: Medicare Other | Admitting: Internal Medicine

## 2017-05-28 ENCOUNTER — Inpatient Hospital Stay: Payer: Medicare Other | Admitting: Internal Medicine

## 2017-05-28 DIAGNOSIS — F411 Generalized anxiety disorder: Secondary | ICD-10-CM | POA: Diagnosis not present

## 2017-05-28 DIAGNOSIS — I6523 Occlusion and stenosis of bilateral carotid arteries: Secondary | ICD-10-CM

## 2017-05-28 MED ORDER — ALPRAZOLAM 0.25 MG PO TABS: 0.2500 mg | ORAL_TABLET | Freq: Every day | ORAL | 1 refills | Status: DC | PRN

## 2017-05-28 NOTE — Assessment & Plan Note (Signed)
He does not have significant symptoms of depression or sleep difficulty at this time. Will rx xanax daily prn to use for temporary with this family strife. He is working on Radiographer, therapeutic to help deal with this stress without feeling so anxious.

## 2017-05-28 NOTE — Progress Notes (Signed)
   Subjective:    Patient ID: Eric [name], male    DOB: September 12, 1926, 83 y.o.   MRN: [id number]  HPI The patient is an 83 YO man with multiple co-morbidities. He is having more anxiety at home and thinks that this is causing his blood pressure to go up. It is mostly 130-140s at home and this makes him worried. He denies SI/HI. He is not having problems sleeping. Is having some disputes with family members and they do not care about him and want nothing to do with him. He denies excessive sleeping, low energy. He is just sad some. Denies lack of joy in activities he likes. His health overall is poor which does not help his mood. Denies depression.   Review of Systems  Constitutional: Negative.   Respiratory: Negative for cough, chest tightness and shortness of breath.   Cardiovascular: Negative for chest pain, palpitations and leg swelling.  Gastrointestinal: Negative for abdominal distention, abdominal pain, constipation, diarrhea, nausea and vomiting.  Musculoskeletal: Negative.   Skin: Negative.   Neurological: Negative.   Psychiatric/Behavioral: Negative for behavioral problems, confusion, decreased concentration, dysphoric mood, self-injury, sleep disturbance and suicidal ideas. The patient is nervous/anxious.       Objective:   Physical Exam  Constitutional: He is oriented to person, place, and time. He appears well-developed and well-nourished.  HENT:  Head: Normocephalic and atraumatic.  Eyes: EOM are normal.  Neck: Normal range of motion.  Cardiovascular: Normal rate and regular rhythm.  Pulmonary/Chest: Effort normal and breath sounds normal. No respiratory distress. He has no wheezes. He has no rales.  On oxygen  Abdominal: Soft.  Musculoskeletal: He exhibits no edema.  Neurological: He is alert and oriented to person, place, and time. Coordination abnormal.  Slow gait  Skin: Skin is warm and dry.  Psychiatric:  Some upset emotion when talking about his situation.     Vitals:   05/28/17 1537  BP: (!) 150/80  Pulse: 97  Temp: 98.7 F (37.1 C)  TempSrc: Oral  SpO2: 98%  Weight: 199 lb (90.3 kg)  Height: 5\' 6"  (1.676 m)      Assessment & Plan:

## 2017-05-28 NOTE — Patient Instructions (Signed)
We have sent in the anxiety medicine that you can use everyday as needed for anxiety. Try to take this only as needed as it can cause some people to feel dizzy.

## 2017-06-03 DIAGNOSIS — M5136 Other intervertebral disc degeneration, lumbar region: Secondary | ICD-10-CM | POA: Diagnosis not present

## 2017-06-03 DIAGNOSIS — T83420D Displacement of penile (implanted) prosthesis, subsequent encounter: Secondary | ICD-10-CM | POA: Diagnosis not present

## 2017-06-03 DIAGNOSIS — G4730 Sleep apnea, unspecified: Secondary | ICD-10-CM | POA: Diagnosis not present

## 2017-06-03 DIAGNOSIS — J449 Chronic obstructive pulmonary disease, unspecified: Secondary | ICD-10-CM | POA: Diagnosis not present

## 2017-06-03 DIAGNOSIS — I2510 Atherosclerotic heart disease of native coronary artery without angina pectoris: Secondary | ICD-10-CM | POA: Diagnosis not present

## 2017-06-03 DIAGNOSIS — E119 Type 2 diabetes mellitus without complications: Secondary | ICD-10-CM | POA: Diagnosis not present

## 2017-06-10 ENCOUNTER — Inpatient Hospital Stay: Payer: Medicare Other | Admitting: Internal Medicine

## 2017-06-11 ENCOUNTER — Telehealth: Payer: Self-pay | Admitting: Internal Medicine

## 2017-06-11 DIAGNOSIS — E119 Type 2 diabetes mellitus without complications: Secondary | ICD-10-CM | POA: Diagnosis not present

## 2017-06-11 DIAGNOSIS — G4730 Sleep apnea, unspecified: Secondary | ICD-10-CM | POA: Diagnosis not present

## 2017-06-11 DIAGNOSIS — I2510 Atherosclerotic heart disease of native coronary artery without angina pectoris: Secondary | ICD-10-CM | POA: Diagnosis not present

## 2017-06-11 DIAGNOSIS — M5136 Other intervertebral disc degeneration, lumbar region: Secondary | ICD-10-CM | POA: Diagnosis not present

## 2017-06-11 DIAGNOSIS — J449 Chronic obstructive pulmonary disease, unspecified: Secondary | ICD-10-CM | POA: Diagnosis not present

## 2017-06-11 DIAGNOSIS — T83420D Displacement of penile (implanted) prosthesis, subsequent encounter: Secondary | ICD-10-CM | POA: Diagnosis not present

## 2017-06-11 NOTE — Telephone Encounter (Signed)
Okay to increase to 2 pills lasix for 5 days to help with fluid.

## 2017-06-11 NOTE — Telephone Encounter (Signed)
Patient informed and stated understanding.

## 2017-06-11 NOTE — Telephone Encounter (Signed)
Pt. Reports shortness of breath is "not too bad.I walked to the mailbox and got winded." States he does have a little more swelling. Would like to go back to his lasix dose of "2 pills twice a day." States when he left the hospital they "cut it to 1 and 1/2 pills a day.

## 2017-06-11 NOTE — Telephone Encounter (Signed)
Copied from [place] [id number]. Topic: Quick Communication - See Telephone Encounter >> Jun 11, 2017 12:53 PM Corie Chiquito, [place] wrote: CRM for notification. See Telephone encounter for: Eric [name] called to let someone know that the patient has questions about his Lasix. Stated that the patient is having SOB and swelling in his body. He wanted to know if he should continue to take 1 and a half dose of the Lasix or just take 2 pills of the lasix. If someone could give him a call back about this at [phone]  06/11/17.

## 2017-06-14 ENCOUNTER — Ambulatory Visit: Payer: Self-pay

## 2017-06-14 NOTE — Telephone Encounter (Signed)
Patient called in with c/o "runny nose." He states "It just drips all the time and I want to know if something can be called in for me." When asked if his nose was congested, he said "no, just runs all the time." I asked what color is the nasal drainage, he says "clear all the time." He denies any other symptoms of nasal congestion, reports a congested cough, but denies a productive cough. He says "I take this allergy pill Loratadine that the [place] sent me and it is not helping. I take it every morning, 1 pill. Can I double up on it?" I advised him to call the pharmacy to see if it's ok to take 2 pills daily. He verbalized understanding. Protocol indicates home care advice, care advice given, he verbalized understanding.   Reason for Disposition . [1] Nasal allergies AND [4] only certain times of year (hay fever)  Answer Assessment - Initial Assessment Questions 1. SYMPTOM: "What's the main symptom you're concerned about?" (e.g., runny nose, stuffiness, sneezing, itching)     Runny nose 2. SEVERITY: "How bad is it?" "What does it keep you from doing?" (e.g., sleeping, working)      Moderate 3. EYES: "Are the eyes also red, watery, and itchy?"     Denies 4. TRIGGER: "What pollen or other allergic substance do you think is causing the symptoms?"      Unknow 5. TREATMENT: "What medicine are you using?" "What medicine worked best in the past?"     Loratadine 6. OTHER SYMPTOMS: "Do you have any other symptoms?" (e.g., coughing, difficulty breathing, wheezing)     Cough 7. PREGNANCY: "Is there any chance you are pregnant?" "When was your last menstrual period?"     N/A  Protocols used: NASAL ALLERGIES (HAY FEVER)-A-AH

## 2017-06-15 DIAGNOSIS — M25511 Pain in right shoulder: Secondary | ICD-10-CM | POA: Diagnosis not present

## 2017-06-18 ENCOUNTER — Telehealth: Payer: Self-pay | Admitting: Internal Medicine

## 2017-06-18 NOTE — Telephone Encounter (Signed)
Copied from [place] [id number]. Topic: Quick Communication - See Telephone Encounter >> Jun 18, 2017 10:56 AM Aurelio Brash B wrote: CRM for notification. See Telephone encounter for:    Pt is requesting  Dr Sharlet Salina send a rx for a lift chair to [facility]  fax [phone] 06/18/17.

## 2017-06-21 NOTE — Telephone Encounter (Signed)
Rx given to [facility].

## 2017-06-21 NOTE — Telephone Encounter (Signed)
Rx Faxed  

## 2017-06-24 ENCOUNTER — Telehealth: Payer: Self-pay | Admitting: Internal Medicine

## 2017-06-24 MED ORDER — AMOXICILLIN 500 MG PO TABS: 500.0000 mg | ORAL_TABLET | Freq: Two times a day (BID) | ORAL | 0 refills | Status: DC

## 2017-06-24 MED ORDER — PREDNISONE 10 MG PO TABS: 10.0000 mg | ORAL_TABLET | Freq: Every day | ORAL | 0 refills | Status: DC

## 2017-06-24 NOTE — Telephone Encounter (Signed)
Called and spoke with pt who stated he developed a scratchy throat x1 week ago.  After that started, he began having some burning in his mouth as well as his nose.  Pt's breathing has become worse to where he had to do 3 breathing treatments last night, 06/23/17.  Pt denies any cough, CP, or fever. Pt has been sleeping with his CPAP machine and O2 but has been having problems while sleeping due to the other symptoms.  Pt was wanting to come in for an appt tomorrow, 06/25/17 to see CY but no openings.  Only appt avail tomorrow is with MW whom pt refuses to see.  Pt has stated that he was wanting to know if something can be sent in to help with pt's symptoms.  Dr. Annamaria Boots, please advise on the above.  Thanks!  Allergies  Allergen Reactions  . Actos [Pioglitazone Hydrochloride] Other (See Comments)    "felt funny, drowsy, and weak":  [name] Buprenorphine Hcl Nausea And Vomiting  . Celebrex [Celecoxib] Other (See Comments)    "felt funny"  . Demerol Palpitations and Other (See Comments)    Increased BP  . Meperidine Palpitations    Other reaction(s): Other (See Comments) Increased BP  . Morphine And Related Nausea And Vomiting  . Ciprofloxacin Other (See Comments)    arthralgia  . Metformin Nausea And Vomiting  . Zocor [Simvastatin] Other (See Comments)    Makes pt very drowsy     Current Outpatient Medications:  .  acetaminophen (TYLENOL) 500 MG tablet, Take 1,000 mg by mouth every 6 (six) hours as needed for mild pain or fever., Disp: , Rfl:  .  albuterol (PROVENTIL HFA) 108 (90 Base) MCG/ACT inhaler, Inhale 2 puffs into the lungs every 4 (four) hours as needed for shortness of breath. Wheezing, Disp: 1 Inhaler, Rfl: 1 .  ALPRAZolam (XANAX) 0.25 MG tablet, Take 1 tablet (0.25 mg total) by mouth daily as needed for anxiety., Disp: 30 tablet, Rfl: 1 .  fluticasone (FLONASE) 50 MCG/ACT nasal spray, Place 2 sprays into both nostrils daily. Allergies, Disp: 16 g, Rfl: 1 .  FLUZONE HIGH-DOSE 0.5  ML injection, Inject 0.5 mLs once into the skin. 03/26/17, Disp: , Rfl: 0 .  furosemide (LASIX) 40 MG tablet, Take 1 tablet (40 mg total) by mouth daily. (Patient taking differently: Take 40 mg 2 (two) times daily by mouth. ), Disp: 90 tablet, Rfl: 3 .  glipiZIDE (GLUCOTROL) 5 MG tablet, Take 2.5 mg 2 (two) times daily by mouth., Disp: , Rfl:  .  guaiFENesin-dextromethorphan (ROBITUSSIN DM) 100-10 MG/5ML syrup, Take 5 mLs by mouth every 4 (four) hours as needed for cough., Disp: 118 mL, Rfl: 0 .  HYDROcodone-acetaminophen (NORCO) 7.5-325 MG tablet, Take 1 tablet every 6 (six) hours as needed by mouth for moderate pain or severe pain., Disp: 20 tablet, Rfl: 0 .  ipratropium-albuterol (DUONEB) 0.5-2.5 (3) MG/3ML SOLN, INHALE 1 vial (3 msl) via NEBULIZER 3 TIMES DAILY AND EVERY 4 HOURS AS NEEDED, Disp: , Rfl: 3 .  nitroGLYCERIN (NITROSTAT) 0.4 MG SL tablet, Place 0.4 mg under the tongue every 5 (five) minutes as needed for chest pain. Reported on 12/12/2015, Disp: , Rfl:  .  oxycodone (OXY-IR) 5 MG capsule, Take 1-2 capsules (5-10 mg total) every 4 (four) hours as needed by mouth., Disp: 20 capsule, Rfl: 0 .  Polyethyl Glycol-Propyl Glycol (SYSTANE) 0.4-0.3 % SOLN, Apply 2 drops to eye 3 (three) times daily as needed (dry eyes). , Disp: , Rfl:  .  potassium chloride (K-DUR) 10 MEQ tablet, Take 2 tablets (20 mEq total) by mouth daily., Disp: 30 tablet, Rfl: 0 .  potassium chloride SA (K-DUR,KLOR-CON) 20 MEQ tablet, Take 20 mEq daily by mouth., Disp: , Rfl:  .  ranitidine (ZANTAC) 300 MG tablet, Take 300 mg by mouth at bedtime. , Disp: , Rfl:

## 2017-06-24 NOTE — Telephone Encounter (Signed)
Offer prednisone 10 mg, # 5, 1 daily x 5 days           Amoxacillin 500 mg, # 14, 1 twice daily

## 2017-06-24 NOTE — Telephone Encounter (Signed)
Called pt letting him know we were sending in an Rx of prednisone and an abx of amoxicillin to his pharmacy. Pt expressed understanding. Rx sent to pharmacy of pt's choice. Nothing further needed at this time.

## 2017-07-07 ENCOUNTER — Encounter: Payer: Self-pay | Admitting: Internal Medicine

## 2017-07-08 ENCOUNTER — Encounter: Payer: Self-pay | Admitting: Internal Medicine

## 2017-07-08 ENCOUNTER — Ambulatory Visit (INDEPENDENT_AMBULATORY_CARE_PROVIDER_SITE_OTHER): Payer: Medicare Other | Admitting: Internal Medicine

## 2017-07-08 VITALS — BP 138/88 | HR 59 | Ht 66.0 in | Wt 209.2 lb

## 2017-07-08 DIAGNOSIS — J9611 Chronic respiratory failure with hypoxia: Secondary | ICD-10-CM

## 2017-07-08 DIAGNOSIS — G4733 Obstructive sleep apnea (adult) (pediatric): Secondary | ICD-10-CM | POA: Diagnosis not present

## 2017-07-08 NOTE — Progress Notes (Signed)
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, English as a second language teacher, followed for asthma/COPD, OSA, hypoxic respiratory failure, complicated by DM, obesity, CAD/ischemic CM/sick sinus brady tachy/ pacemaker, history of prostate cancer, anemia, permanent colostomy/diverticulitis  ------------------------------------------------------------------------------------------------------------  03/15/17- 84 year old male former smoker,Veteran, followed for asthma/COPD, OSA, complicated by DM, obesity, CAD/ischemic CM/sick sinus/bradycardia tachycardia/pacemaker/chronic diastolic CHF, history of prostate cancer, anemia, chronic myeloid leukemia CPAP 12[facility] [place]   O2 2-4LPOC/ sleep [facility] COPD, OSA. Pt needs our office to resume his CPAPO2 needs; [facility] had been taking care of the O2 but no longer is under Hospice care. DME: [facility]. Pt is out of E-tanks for out of the house use; has concentrator to use at home. Hospice contract time ran out. Needs to change DME. Can't walk more than a few steps around his home without oxygen. CXR 10/13/16 IMPRESSION: Bilateral pleural effusions with vascular congestion or mild pulmonary edema. Minimal change from the previous examination.  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 Pt wears CPAP and O2-DL attached. Pt needs assistance with O2 cords from [facility].  [facility] has suggested a M6 O2 tank for him for portability.  He remains dependent on oxygen. He feels CPAP 12 is too much pressure.  Download 94% compliance AHI 0.2/hour.  [facility] [place] [facility] but he asks we take over and get him back with [facility]  because the [place] is not sending supplies quickly when requested.. CXR  04/16/17- IMPRESSION: Bilateral pleural effusions, left greater than right. Pulmonary vascular congestion. Enlarged cardiac silhouette. Calcific atherosclerotic disease of the aorta.  ROS-see HPI + = positive Constitutional:   No-   weight loss, night sweats, fevers, chills, fatigue, lassitude. HEENT:   No-  headaches, difficulty swallowing, tooth/dental problems, sore throat,       No-  sneezing, itching, ear ache, nasal congestion, post nasal drip,  CV:  No- recent  chest pain, orthopnea, PND. +Mild chronic swelling in lower extremities,                                        No- anasarca, dizziness, palpitations Resp: +  shortness of breath with exertion or at rest.               productive cough,   non-productive cough,  No- coughing up of blood.               change in color of mucus.  No- wheezing.   Skin: No-   rash or lesions. GI:  No-   heartburn, indigestion, abdominal pain, nausea, vomiting,  GU:  MS: . Limiting hip pain Neuro-     nothing unusual Psych:  No- change in mood or affect. No depression or anxiety.  No memory loss.  OBJ General- Alert, Oriented, Affect-appropriate, Distress- none acute, +overweight, O2 3L Skin- rash-none, lesions- none, excoriation- none, + flushed cheeks Lymphadenopathy- none Head- atraumatic            Eyes- Gross vision intact, PERRLA, conjunctivae clear secretions            Ears- Hearing aids, HOH            Nose- Clear, no-Septal dev, mucus, polyps, erosion, perforation  Throat- Mallampati II , mucosa clear , drainage- none, tonsils- atrophic, +dentures                     Neck- flexible , trachea midline, no stridor , thyroid nl, carotid no bruit Chest - symmetrical excursion , unlabored           Heart/CV- RRR /paced, no murmur , no gallop  , no rub, nl s1 s2                           - JVD- none , edema- none, stasis changes- none, varices- none           Lung- clear/ distant, unlabored, wheeze- none, cough-none, but  sounds congested when he laughs,   dullness-none, rub-  none. .           Chest wall- + pacemaker left chest Abd- + colostomy Br/ Gen/ Rectal- Not done, not indicated Extrem- cyanosis- none, clubbing, none, atrophy- none, strength- deconditioned, +cane Neuro- grossly intact to observation

## 2017-07-08 NOTE — Patient Instructions (Signed)
Order- DME [facility]- please reduce CPAP to 10 cwp, continue mask of choice,humidifier, supplies, AirView              DME [facility]- please provide M6 O2 tank and M6 bag   2L     Dx chronic hypoxic respiratory failure       Please call if we can help

## 2017-07-19 NOTE — Assessment & Plan Note (Signed)
He had tripped over her oxygen cord at home.  DME recommends M6 tank for portability   2-3L

## 2017-07-19 NOTE — Assessment & Plan Note (Signed)
Okay at his request to switch CPAP management from [place] to [facility] without break in usage.  He has benefited from CPAP but for comfort we are reducing pressure to 10 with O2 bleed in at 2L

## 2017-07-29 ENCOUNTER — Ambulatory Visit (INDEPENDENT_AMBULATORY_CARE_PROVIDER_SITE_OTHER): Payer: Medicare Other | Admitting: Interventional Cardiology

## 2017-07-29 ENCOUNTER — Telehealth: Payer: Self-pay | Admitting: Interventional Cardiology

## 2017-07-29 ENCOUNTER — Encounter: Payer: Self-pay | Admitting: Interventional Cardiology

## 2017-07-29 VITALS — BP 138/60 | HR 75 | Ht 66.0 in | Wt 214.2 lb

## 2017-07-29 DIAGNOSIS — I25708 Atherosclerosis of coronary artery bypass graft(s), unspecified, with other forms of angina pectoris: Secondary | ICD-10-CM

## 2017-07-29 DIAGNOSIS — I6523 Occlusion and stenosis of bilateral carotid arteries: Secondary | ICD-10-CM | POA: Diagnosis not present

## 2017-07-29 DIAGNOSIS — J449 Chronic obstructive pulmonary disease, unspecified: Secondary | ICD-10-CM | POA: Diagnosis not present

## 2017-07-29 DIAGNOSIS — I495 Sick sinus syndrome: Secondary | ICD-10-CM

## 2017-07-29 DIAGNOSIS — I5032 Chronic diastolic (congestive) heart failure: Secondary | ICD-10-CM

## 2017-07-29 NOTE — Progress Notes (Signed)
Cardiology Office Note    Date:  07/29/2017   ID:  Eric [name], DOB 05/12/1927, MRN [id number]  PCP:  [name], MD  Cardiologist: Sinclair Grooms, MD   Chief Complaint  Patient presents with  . Shortness of Breath  . Coronary Artery Disease    History of Present Illness:  Eric [name] is a 84 y.o. male with history of chronic CAD, prior coronary bypass grafting 1995, DES 2008, 2009, and 1999-05-10 vein grafts, bilateral carotid disease, essential hypertension, hyperlipidemia, and diabetes mellitus.  [name] voices no cardiac complaints.  He is on chronic O2 therapy, ambulated into the office, but appeared to be quite weak and unstable.  He still lives independently.  He has begun seeing primary care at the [facility].  We did not have access to the records.  He states recent blood work was done there within the past 10 days.  He stopped taking potassium.  He does not know what the blood work showed.  He has not had angina.  He has not needed to use nitroglycerin.  He denies lower extremity swelling.  He sleeps on 2 thin pillows with CPAP and oxygen.  He short of breath immediately upon lying down but it improves after several minutes.  He sleeps well.  Past Medical History:  Diagnosis Date  . ALLERGIC RHINITIS   . ANEMIA-NOS   . AORTIC SCLEROSIS   . Asthma   . CARDIOMYOPATHY, ISCHEMIC   . CAROTID BRUIT, RIGHT 02/27/2008  . Cataract    surgery  . CML (chronic myeloid leukemia) ([place]) 06/26/2015  . COPD   . CORONARY ARTERY DISEASE    a. s/p CABG in 1995 b. DES in 2008, 2009, and most recent in 2012 with DES to SVG-OM  . DIABETES MELLITUS-TYPE II    diet controlled  . Diastolic dysfunction, Grade 1 11/24/2014  . Diverticulitis of colon with perforation 11/22/2014  . Diverticulosis   . GERD   . HIATAL HERNIA   . Hx of echocardiogram    Echo (9/15):  Mild LVH, EF 50-55%, no RWMA, Gr 1 DD, MAC, mild LAE.  [name] HYPERLIPIDEMIA   .  HYPERTENSION   . Hyponatremia 11/22/2014  . IBS (irritable bowel syndrome)   . LACTOSE INTOLERANCE   . OA (osteoarthritis)   . OBESITY   . On home oxygen therapy    "3L all the time" (10/12/2016)  . Partial small bowel obstruction ([place])   . PERIPHERAL VASCULAR DISEASE   . Primary hyperparathyroidism ([place])    Lab Results Component Value Date  PTH 150.7* 02/13/2013  CALCIUM 11.0* 02/13/2013  CAION 1.21 03/15/2008    . Prostate cancer ([place])    seed implants 2004  . SICK SINUS/ TACHY-BRADY SYNDROME 09/2007   s/p PPM st judes  . Sleep apnea   . SMALL BOWEL OBSTRUCTION 04/18/2009   Qualifier: History of  By: Asa Lente MD, Jannifer Rodney Symptomatic diverticulosis 01/18/2009   Qualifier: Diagnosis of  By: Shane Crutch, Amy S     Past Surgical History:  Procedure Laterality Date  . BACK SURGERY    . Bilateral cataracts    . COLON RESECTION N/A 11/28/2014   Procedure: EXPLORATORY LAPAROTOMY, SIGMOID COLECTOMY WITH COLOSTOMY;  Surgeon: Jackolyn Confer, MD;  Location: [facility];  Service: General;  Laterality: N/A;  . COLON SURGERY    . COLONOSCOPY    . CORONARY ARTERY BYPASS GRAFT    . ESOPHAGOGASTRODUODENOSCOPY  multiple  .  FLEXIBLE SIGMOIDOSCOPY N/A 09/22/2013   Procedure: FLEXIBLE SIGMOIDOSCOPY;  Surgeon: Gatha Mayer, MD;  Location: [facility];  Service: Endoscopy;  Laterality: N/A;  . INGUINAL HERNIA REPAIR Bilateral   . LUMBAR [place] SURGERY  12/2008  . PACEMAKER INSERTION     DDD/[employer]         Last interrogation 2/13  on chart     Pacemaker guideline order Dr Tamala Julian on chart  . Partial small bowel obstruction  2009  . PENILE PROSTHESIS PLACEMENT    . PTCA  2008, 2009, 2012   with DES  . REMOVAL OF PENILE PROSTHESIS N/A 04/22/2017   Procedure: EXPLANT OF MULTICOMPONENT PENILE PROSTHESIS;  Surgeon: Irine Seal, MD;  Location: [facility];  Service: Urology;  Laterality: N/A;  . TOTAL HIP ARTHROPLASTY  08/21/2011   Procedure: TOTAL HIP ARTHROPLASTY;  Surgeon: Johnn Hai, MD;   Location: [facility];  Service: Orthopedics;  Laterality: Right;    Current Medications: Outpatient Medications Prior to Visit  Medication Sig Dispense Refill  . acetaminophen (TYLENOL) 500 MG tablet Take 1,000 mg by mouth every 6 (six) hours as needed for mild pain or fever.    [name] albuterol (PROVENTIL HFA) 108 (90 Base) MCG/ACT inhaler Inhale 2 puffs into the lungs every 4 (four) hours as needed for shortness of breath. Wheezing 1 Inhaler 1  . ALPRAZolam (XANAX) 0.25 MG tablet Take 1 tablet (0.25 mg total) by mouth daily as needed for anxiety. 30 tablet 1  . fluticasone (FLONASE) 50 MCG/ACT nasal spray Place 2 sprays into both nostrils daily. Allergies 16 g 1  . furosemide (LASIX) 40 MG tablet Take 1 tablet (40 mg total) by mouth daily. (Patient taking differently: Take 40 mg 2 (two) times daily by mouth. ) 90 tablet 3  . glipiZIDE (GLUCOTROL) 5 MG tablet Take 2.5 mg 2 (two) times daily by mouth.    [name] guaiFENesin-dextromethorphan (ROBITUSSIN DM) 100-10 MG/5ML syrup Take 5 mLs by mouth every 4 (four) hours as needed for cough. 118 mL 0  . HYDROcodone-acetaminophen (NORCO) 7.5-325 MG tablet Take 1 tablet every 6 (six) hours as needed by mouth for moderate pain or severe pain. 20 tablet 0  . ipratropium-albuterol (DUONEB) 0.5-2.5 (3) MG/3ML SOLN INHALE 1 vial (3 msl) via NEBULIZER 3 TIMES DAILY AND EVERY 4 HOURS AS NEEDED  3  . nitroGLYCERIN (NITROSTAT) 0.4 MG SL tablet Place 0.4 mg under the tongue every 5 (five) minutes as needed for chest pain. Reported on 12/12/2015    . Polyethyl Glycol-Propyl Glycol (SYSTANE) 0.4-0.3 % SOLN Apply 2 drops to eye 3 (three) times daily as needed (dry eyes).     . pravastatin (PRAVACHOL) 20 MG tablet Take 20 mg by mouth daily.    . ranitidine (ZANTAC) 300 MG tablet Take 300 mg by mouth at bedtime.     . valsartan (DIOVAN) 160 MG tablet Take 160 mg by mouth daily.    . potassium chloride (K-DUR) 10 MEQ tablet Take 2 tablets (20 mEq total) by mouth daily. (Patient not  taking: Reported on 07/29/2017) 30 tablet 0  . potassium chloride SA (K-DUR,KLOR-CON) 20 MEQ tablet Take 20 mEq daily by mouth.     No facility-administered medications prior to visit.      Allergies:   Actos [pioglitazone hydrochloride]; Buprenorphine hcl; Celebrex [celecoxib]; Demerol; Meperidine; Morphine and related; Ciprofloxacin; Metformin; and Zocor [simvastatin]   Social History   Socioeconomic History  . Marital status: Widowed    Spouse name: None  . Number  of children: None  . Years of education: None  . Highest education level: None  Social Needs  . Financial resource strain: None  . Food insecurity - worry: None  . Food insecurity - inability: None  . Transportation needs - medical: None  . Transportation needs - non-medical: None  Occupational History  . Occupation: retired    Fish farm manager: RETIRED  Tobacco Use  . Smoking status: Former Smoker    Packs/day: 1.00    Years: 25.00    Pack years: 25.00    Types: Cigarettes    Last attempt to quit: 06/08/1994    Years since quitting: 23.1  . Smokeless tobacco: Never Used  Substance and Sexual Activity  . Alcohol use: No    Alcohol/week: 0.0 oz  . Drug use: No  . 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 None  Social History Narrative  . None     Family History:  The patient's family history includes Cancer in his mother; Heart attack in his brother, unknown relative, and unknown relative; Hypertension in his mother.   ROS:   Please see the history of present illness.    Arthritis in both knees.  Cough and phlegm production each morning.  Difficulty resting at times due to his neighbors. All other systems reviewed and are negative.   PHYSICAL EXAM:   VS:  BP 138/60   Pulse 75   Ht 5' 6"  (1.676 m)   Wt 214 lb 3.2 oz (97.2 kg)   BMI 34.57 kg/m    GEN: Well nourished, well developed, in no acute distress.  Moderate obesity.   Appears frail.  Oxygen cannula in place. HEENT: normal  Neck: no JVD, carotid bruits, or masses Cardiac: RRR; no murmurs, rubs, or gallops,no edema  Respiratory:  clear to auscultation bilaterally, normal work of breathing GI: soft, nontender, nondistended, + BS MS: no deformity or atrophy  Skin: warm and dry, no rash Neuro:  Alert and Oriented x 3, Strength and sensation are intact Psych: euthymic mood, full affect  Wt Readings from Last 3 Encounters:  07/29/17 214 lb 3.2 oz (97.2 kg)  07/08/17 209 lb 3.2 oz (94.9 kg)  05/28/17 199 lb (90.3 kg)      Studies/Labs Reviewed:   EKG:  EKG ECG in November 2018 demonstrated normal sinus rhythm with normal appearance.  Recent Labs: 10/12/2016: B Natriuretic Peptide 496.0 03/12/2017: ALT 9 04/16/2017: Platelets 212 04/23/2017: BUN 26; Creatinine, Ser 1.33; Hemoglobin 7.9; Potassium 5.1; Sodium 136   Lipid Panel    Component Value Date/Time   CHOL 108 05/27/2015 1512   TRIG 151.0 (H) 05/27/2015 1512   TRIG 70 01/06/2010   HDL 22.70 (L) 05/27/2015 1512   CHOLHDL 5 05/27/2015 1512   VLDL 30.2 05/27/2015 1512   LDLCALC 55 05/27/2015 1512    Additional studies/ records that were reviewed today include:  No cardiovascular imaging studies since December 2017.  In his current state, no particular evaluation is needed at this time.    ASSESSMENT:    1. Tachy-brady syndrome ([place])   2. Coronary artery disease of bypass graft of native heart with stable angina pectoris ([place])   3. Bilateral carotid artery stenosis   4. Chronic diastolic heart failure ([place])   5. COPD mixed type ([place])      PLAN:  In order of problems listed above:  1. Asymptomatic with normal pacemaker function when last  evaluated. 2. No significant anginal episodes.  Continue conservative monitoring.  Notify us if increased requirement for nitroglycerin. 3. No neurological complaints. 4. No evidence of volume overload. 5. Continue home O2.  There is difficulty  obtaining all medical records because much of his care is now being done at the [facility] and [place].  Recent laboratory data checking potassium and kidney function was performed but he does not know the results.  He also states that his physician at the [facility] restarted valsartan and pravastatin.  He believes the doses are not 160 mg and 20 mg respectively.  He promises to call back for results.  He also states that he has stopped taking potassium.  States he has made all of his providers aware of this.    Medication Adjustments/Labs and Tests Ordered: Current medicines are reviewed at length with the patient today.  Concerns regarding medicines are outlined above.  Medication changes, Labs and Tests ordered today are listed in the Patient Instructions below. Patient Instructions  Medication Instructions:  Your physician recommends that you continue on your current medications as directed. Please refer to the Current Medication list given to you today.  Labwork: None  Testing/Procedures: None  Follow-Up: Your physician wants you to follow-up in: 9-12 months with Dr. Tamala Julian.  You will receive a reminder letter in the mail two months in advance. If you don't receive a letter, please call our office to schedule the follow-up appointment.   Any Other Special Instructions Will Be Listed Below (If Applicable).     If you need a refill on your cardiac medications before your next appointment, please call your pharmacy.      Signed, Sinclair Grooms, MD  07/29/2017 4:05 PM    [facility] [address], [place], [place]  [address] Phone: [phone]; Fax: [phone]

## 2017-07-29 NOTE — Telephone Encounter (Signed)
New Message   Patient was just in the office and saw Dr. Tamala Julian. He states that Dr. Tamala Julian [facility] He states that name of the medication is valsartan 160mg  once a day. Also pravastatin 20mg  to take 3 at night before bedtime.

## 2017-07-29 NOTE — Telephone Encounter (Signed)
Spoke with pt to make sure he is taking Pravastatin 60mg  QD.  Pt states this is correct and he is taking three 20mg  tablets.

## 2017-07-29 NOTE — Patient Instructions (Signed)
Medication Instructions:  Your physician recommends that you continue on your current medications as directed. Please refer to the Current Medication list given to you today.  Labwork: None  Testing/Procedures: None  Follow-Up: Your physician wants you to follow-up in: 9-12 months with Dr. Smith.  You will receive a reminder letter in the mail two months in advance. If you don't receive a letter, please call our office to schedule the follow-up appointment.   Any Other Special Instructions Will Be Listed Below (If Applicable).     If you need a refill on your cardiac medications before your next appointment, please call your pharmacy.   

## 2017-08-02 DIAGNOSIS — H6123 Impacted cerumen, bilateral: Secondary | ICD-10-CM | POA: Diagnosis not present

## 2017-08-02 DIAGNOSIS — J019 Acute sinusitis, unspecified: Secondary | ICD-10-CM | POA: Diagnosis not present

## 2017-08-05 ENCOUNTER — Telehealth: Payer: Self-pay | Admitting: Interventional Cardiology

## 2017-08-05 NOTE — Telephone Encounter (Signed)
Pt states that last night he had swelling in both legs, left worse then right.  Both had gone back down this morning when he woke up.  States he coughs up a lot of white, frothy mucus everyday.  Pt states he has gained 9lbs in the last 3 weeks.  Weight this AM was 211lbs.  Pt gets SOB with exertion.  Denies lightheadedness, dizziness or CP.  Currently running his O2 at 4LPM.  Currently taking Furosemide 40mg - 2 tabs QD.  Pt does not wish to come into the office or go to the hospital.  Pt hoping Dr. Tamala Julian will just change diuretics.  Advised I will send message to Dr. Tamala Julian for review and advisement.

## 2017-08-05 NOTE — Telephone Encounter (Signed)
New Message   Pt c/o swelling: STAT is pt has developed SOB within 24 hours  1) How much weight have you gained and in what time span? 9lbs in 3 weeks   2) If swelling, where is the swelling located? Left leg  3) Are you currently taking a fluid pill?   4) Are you currently SOB? No   5) Do you have a log of your daily weights (if so, list)? no  6) Have you gained 3 pounds in a day or 5 pounds in a week? No   7) Have you traveled recently? No    Patient states that he is spitting up a lot of mucus. But he has no chest pains.

## 2017-08-06 NOTE — Telephone Encounter (Signed)
Take 80 mg furosemide later today if already had AM dose of 40 mg. If not taken yet this morning take 80 mg instead of 40 mg. After first dose of 80 mg he should take another dose on schedule he would ordinarily use. After total of two 80 mg doses, go back to 40 mg BID. Call us Monday.

## 2017-08-06 NOTE — Telephone Encounter (Signed)
Spoke with pt and went over recommendations per Dr. Tamala Julian.  Pt actually took an extra 80mg  yesterday.  Pt then proceeded to tell me that he has been going to [employer] a lot for breakfast and eating biscuits and gravy.  Advised pt not to do this anymore and taking Furosemide as instructed by Dr. Tamala Julian today to see if we can get the fluid off.  Advised pt to call me back on Monday and let me know how he is doing.  Pt verbalized understanding and was in agreement with this plan.

## 2017-08-13 ENCOUNTER — Ambulatory Visit: Payer: Medicare Other | Admitting: Gastroenterology

## 2017-08-17 ENCOUNTER — Other Ambulatory Visit: Payer: Self-pay

## 2017-08-17 MED ORDER — FUROSEMIDE 40 MG PO TABS: 40.0000 mg | ORAL_TABLET | Freq: Two times a day (BID) | ORAL | 6 refills | Status: DC

## 2017-08-18 ENCOUNTER — Ambulatory Visit: Payer: Self-pay

## 2017-08-18 NOTE — Telephone Encounter (Signed)
  Reason for Disposition . [1] MODERATE pain (e.g., interferes with normal activities) AND [2] pain comes and goes (cramps) AND [3] present > 24 hours  (Exception: pain with Vomiting or Diarrhea - see that Guideline)  Answer Assessment - Initial Assessment Questions 1. LOCATION: "Where does it hurt?"      Left side - near colostomy 2. RADIATION: "Does the pain shoot anywhere else?" (e.g., chest, back)     No 3. ONSET: "When did the pain begin?" (Minutes, hours or days ago)       Started  1 week ago 4. SUDDEN: "Gradual or sudden onset?"     Gradual 5. PATTERN "Does the pain come and go, or is it constant?"    - If constant: "Is it getting better, staying the same, or worsening?"      (Note: Constant means the pain never goes away completely; most serious pain is constant and it progresses)     - If intermittent: "How long does it last?" "Do you have pain now?"     (Note: Intermittent means the pain goes away completely between bouts)     Trying to get better 6. SEVERITY: "How bad is the pain?"  (e.g., Scale 1-10; mild, moderate, or severe)    - MILD (1-3): doesn't interfere with normal activities, abdomen soft and not tender to touch     - MODERATE (4-7): interferes with normal activities or awakens from sleep, tender to touch     - SEVERE (8-10): excruciating pain, doubled over, unable to do any normal activities     This morning it is a 4 7. RECURRENT SYMPTOM: "Have you ever had this type of abdominal pain before?" If so, ask: "When was the last time?" and "What happened that time?"      No 8. CAUSE: "What do you think is causing the abdominal pain?"     Unsure 9. RELIEVING/AGGRAVATING FACTORS: "What makes it better or worse?" (e.g., movement, antacids, bowel movement)     Eating sometimes it feels better 10. OTHER SYMPTOMS: "Has there been any vomiting, diarrhea, constipation, or urine problems?"       No  Protocols used: ABDOMINAL PAIN - MALE-A-AH

## 2017-08-18 NOTE — Telephone Encounter (Signed)
Noted  

## 2017-08-18 NOTE — Telephone Encounter (Signed)
Pt. Reports abdominal pain started last week. States he had some constipation - states that resolved, but he still has some pain near his colostomy. Denies diarrhea or fever. Wants to be sure he does not "have any kind of infection." Appointment made as requested.

## 2017-08-19 ENCOUNTER — Encounter: Payer: Self-pay | Admitting: Internal Medicine

## 2017-08-19 ENCOUNTER — Ambulatory Visit (INDEPENDENT_AMBULATORY_CARE_PROVIDER_SITE_OTHER): Payer: Medicare Other | Admitting: Internal Medicine

## 2017-08-19 DIAGNOSIS — I6523 Occlusion and stenosis of bilateral carotid arteries: Secondary | ICD-10-CM

## 2017-08-19 DIAGNOSIS — M545 Low back pain: Secondary | ICD-10-CM

## 2017-08-19 DIAGNOSIS — Z933 Colostomy status: Secondary | ICD-10-CM | POA: Diagnosis not present

## 2017-08-19 DIAGNOSIS — G8929 Other chronic pain: Secondary | ICD-10-CM | POA: Diagnosis not present

## 2017-08-19 MED ORDER — PREDNISONE 20 MG PO TABS: 40.0000 mg | ORAL_TABLET | Freq: Every day | ORAL | 0 refills | Status: DC

## 2017-08-19 NOTE — Assessment & Plan Note (Signed)
Rx for steroid burst to help with pain and inflammation and this should help with knee pain as well.

## 2017-08-19 NOTE — Progress Notes (Signed)
   Subjective:    Patient ID: Eric [name], male    DOB: 1927-02-15, 84 y.o.   MRN: [id number]  HPI The patient is an 84 YO man coming in for left knee pain and lower back pain. He has a visit coming up with his back specialist and they want to do some injections in his back. The lower back pain radiates into his stomach. He does have a colostomy but this is normal lately. Some constipation intermittent but BM yesterday. No nausea or vomiting. Output is normal and no blood.   Review of Systems  Constitutional: Positive for activity change. Negative for appetite change, fatigue, fever and unexpected weight change.  Respiratory: Negative.   Cardiovascular: Negative.   Musculoskeletal: Positive for arthralgias, back pain, joint swelling and myalgias.  Skin: Negative.   Neurological: Negative for syncope, weakness and numbness.      Objective:   Physical Exam  Constitutional: He is oriented to person, place, and time. He appears well-developed and well-nourished.  HENT:  Head: Normocephalic and atraumatic.  Eyes: EOM are normal.  Neck: Normal range of motion.  Cardiovascular: Normal rate and regular rhythm.  Pulmonary/Chest: Effort normal and breath sounds normal. No respiratory distress. He has no wheezes. He has no rales.  On oxygen  Abdominal: Soft. Bowel sounds are normal. He exhibits no distension. There is no tenderness. There is no rebound.  Stoma site intact and no signs of infection or drainage  Musculoskeletal: He exhibits tenderness. He exhibits no edema.  Pain left knee, pain lumbar region radiates into the stomach  Neurological: He is alert and oriented to person, place, and time. Coordination normal.  Skin: Skin is warm and dry.   Vitals:   08/19/17 1035  BP: 122/60  Pulse: (!) 49  Temp: 98.5 F (36.9 C)  TempSrc: Oral  SpO2: 99%  Weight: 214 lb (97.1 kg)  Height: 5\' 6"  (1.676 m)      Assessment & Plan:

## 2017-08-19 NOTE — Patient Instructions (Signed)
We have sent in the prednisone to take 2 pills daily for 5 days to help the back and knee pain.   I think the back is causing the stomach pain.

## 2017-08-19 NOTE — Assessment & Plan Note (Signed)
Intact and no signs of infection on exam.

## 2017-08-24 ENCOUNTER — Telehealth: Payer: Self-pay | Admitting: Internal Medicine

## 2017-08-25 NOTE — Telephone Encounter (Signed)
Close encounter 

## 2017-09-01 ENCOUNTER — Ambulatory Visit: Payer: Medicare Other | Admitting: Gastroenterology

## 2017-09-07 ENCOUNTER — Telehealth: Payer: Self-pay | Admitting: Internal Medicine

## 2017-09-07 DIAGNOSIS — M47816 Spondylosis without myelopathy or radiculopathy, lumbar region: Secondary | ICD-10-CM | POA: Diagnosis not present

## 2017-09-07 NOTE — Telephone Encounter (Signed)
Should be ok to go ahead with planned procedure

## 2017-09-07 NOTE — Telephone Encounter (Signed)
Called and spoke with patient. He is wanting to have a procedure done that takes the eye lashes that are growing in his eyelids out. He can have the procedure done, but he will have to be off of his o2 for about 15 minutes. Patient is wanting to know if this is ok.  CY please advise, thanks.   Current Outpatient Medications on File Prior to Visit  Medication Sig Dispense Refill  . acetaminophen (TYLENOL) 500 MG tablet Take 1,000 mg by mouth every 6 (six) hours as needed for mild pain or fever.    [name] albuterol (PROVENTIL HFA) 108 (90 Base) MCG/ACT inhaler Inhale 2 puffs into the lungs every 4 (four) hours as needed for shortness of breath. Wheezing 1 Inhaler 1  . ALPRAZolam (XANAX) 0.25 MG tablet Take 1 tablet (0.25 mg total) by mouth daily as needed for anxiety. 30 tablet 1  . fluticasone (FLONASE) 50 MCG/ACT nasal spray Place 2 sprays into both nostrils daily. Allergies 16 g 1  . furosemide (LASIX) 40 MG tablet Take 1 tablet (40 mg total) by mouth 2 (two) times daily. 60 tablet 6  . glipiZIDE (GLUCOTROL) 5 MG tablet Take 2.5 mg 2 (two) times daily by mouth.    [name] guaiFENesin-dextromethorphan (ROBITUSSIN DM) 100-10 MG/5ML syrup Take 5 mLs by mouth every 4 (four) hours as needed for cough. 118 mL 0  . HYDROcodone-acetaminophen (NORCO) 7.5-325 MG tablet Take 1 tablet every 6 (six) hours as needed by mouth for moderate pain or severe pain. 20 tablet 0  . ipratropium-albuterol (DUONEB) 0.5-2.5 (3) MG/3ML SOLN INHALE 1 vial (3 msl) via NEBULIZER 3 TIMES DAILY AND EVERY 4 HOURS AS NEEDED  3  . nitroGLYCERIN (NITROSTAT) 0.4 MG SL tablet Place 0.4 mg under the tongue every 5 (five) minutes as needed for chest pain. Reported on 12/12/2015    . Polyethyl Glycol-Propyl Glycol (SYSTANE) 0.4-0.3 % SOLN Apply 2 drops to eye 3 (three) times daily as needed (dry eyes).     . pravastatin (PRAVACHOL) 20 MG tablet Take 60 mg by mouth daily.     . predniSONE (DELTASONE) 20 MG tablet Take 2 tablets (40 mg total) by mouth  daily with breakfast. 10 tablet 0  . ranitidine (ZANTAC) 300 MG tablet Take 300 mg by mouth at bedtime.     . valsartan (DIOVAN) 160 MG tablet Take 160 mg by mouth daily.     No current facility-administered medications on file prior to visit.      Allergies  Allergen Reactions  . Actos [Pioglitazone Hydrochloride] Other (See Comments)    "felt funny, drowsy, and weak":  [name] Buprenorphine Hcl Nausea And Vomiting  . Celebrex [Celecoxib] Other (See Comments)    "felt funny"  . Demerol Palpitations and Other (See Comments)    Increased BP  . Meperidine Palpitations    Other reaction(s): Other (See Comments) Increased BP  . Morphine And Related Nausea And Vomiting  . Ciprofloxacin Other (See Comments)    arthralgia  . Metformin Nausea And Vomiting  . Zocor [Simvastatin] Other (See Comments)    Makes pt very drowsy

## 2017-09-08 NOTE — Telephone Encounter (Signed)
Pt is returning call. Cb is [phone]

## 2017-09-08 NOTE — Telephone Encounter (Signed)
Attempted to call patient, no answer, message left for patient to call back.  

## 2017-09-08 NOTE — Telephone Encounter (Signed)
Spoke with the pt and notified of recs per CDY  He verbalized understanding  Nothing further needed

## 2017-09-10 DIAGNOSIS — H02054 Trichiasis without entropian left upper eyelid: Secondary | ICD-10-CM | POA: Diagnosis not present

## 2017-09-10 DIAGNOSIS — H02051 Trichiasis without entropian right upper eyelid: Secondary | ICD-10-CM | POA: Diagnosis not present

## 2017-09-23 ENCOUNTER — Telehealth: Payer: Self-pay | Admitting: Internal Medicine

## 2017-09-23 MED ORDER — AZITHROMYCIN 250 MG PO TABS: ORAL_TABLET | ORAL | 0 refills | Status: AC

## 2017-09-23 NOTE — Telephone Encounter (Signed)
Pt is aware of below message and voiced his understanding. Rx for Zpak has been sent to preferred pharmacy. Nothing further is needed.

## 2017-09-23 NOTE — Telephone Encounter (Signed)
Offer Zpak    250 mg, # 6, 2 today then one daily 

## 2017-09-23 NOTE — Telephone Encounter (Signed)
Called and spoke to pt. Pt reports of prod cough with thick yellow to white mucus, wheezing & increased sob with exertion x3d taking Duoneb 3-4 times daily with mild improvement.  Denied fever, chills, sweats or body aches.   CY please advise. Thanks  Current Outpatient Medications on File Prior to Visit  Medication Sig Dispense Refill  . acetaminophen (TYLENOL) 500 MG tablet Take 1,000 mg by mouth every 6 (six) hours as needed for mild pain or fever.    [name] albuterol (PROVENTIL HFA) 108 (90 Base) MCG/ACT inhaler Inhale 2 puffs into the lungs every 4 (four) hours as needed for shortness of breath. Wheezing 1 Inhaler 1  . ALPRAZolam (XANAX) 0.25 MG tablet Take 1 tablet (0.25 mg total) by mouth daily as needed for anxiety. 30 tablet 1  . fluticasone (FLONASE) 50 MCG/ACT nasal spray Place 2 sprays into both nostrils daily. Allergies 16 g 1  . furosemide (LASIX) 40 MG tablet Take 1 tablet (40 mg total) by mouth 2 (two) times daily. 60 tablet 6  . glipiZIDE (GLUCOTROL) 5 MG tablet Take 2.5 mg 2 (two) times daily by mouth.    [name] guaiFENesin-dextromethorphan (ROBITUSSIN DM) 100-10 MG/5ML syrup Take 5 mLs by mouth every 4 (four) hours as needed for cough. 118 mL 0  . HYDROcodone-acetaminophen (NORCO) 7.5-325 MG tablet Take 1 tablet every 6 (six) hours as needed by mouth for moderate pain or severe pain. 20 tablet 0  . ipratropium-albuterol (DUONEB) 0.5-2.5 (3) MG/3ML SOLN INHALE 1 vial (3 msl) via NEBULIZER 3 TIMES DAILY AND EVERY 4 HOURS AS NEEDED  3  . nitroGLYCERIN (NITROSTAT) 0.4 MG SL tablet Place 0.4 mg under the tongue every 5 (five) minutes as needed for chest pain. Reported on 12/12/2015    . Polyethyl Glycol-Propyl Glycol (SYSTANE) 0.4-0.3 % SOLN Apply 2 drops to eye 3 (three) times daily as needed (dry eyes).     . pravastatin (PRAVACHOL) 20 MG tablet Take 60 mg by mouth daily.     . predniSONE (DELTASONE) 20 MG tablet Take 2 tablets (40 mg total) by mouth daily with breakfast. 10 tablet 0  .  ranitidine (ZANTAC) 300 MG tablet Take 300 mg by mouth at bedtime.     . valsartan (DIOVAN) 160 MG tablet Take 160 mg by mouth daily.     No current facility-administered medications on file prior to visit.     Allergies  Allergen Reactions  . Actos [Pioglitazone Hydrochloride] Other (See Comments)    "felt funny, drowsy, and weak":  [name] Buprenorphine Hcl Nausea And Vomiting  . Celebrex [Celecoxib] Other (See Comments)    "felt funny"  . Demerol Palpitations and Other (See Comments)    Increased BP  . Meperidine Palpitations    Other reaction(s): Other (See Comments) Increased BP  . Morphine And Related Nausea And Vomiting  . Ciprofloxacin Other (See Comments)    arthralgia  . Metformin Nausea And Vomiting  . Zocor [Simvastatin] Other (See Comments)    Makes pt very drowsy

## 2017-09-27 ENCOUNTER — Encounter: Payer: Self-pay | Admitting: Internal Medicine

## 2017-09-27 DIAGNOSIS — I495 Sick sinus syndrome: Secondary | ICD-10-CM | POA: Diagnosis not present

## 2017-09-28 ENCOUNTER — Telehealth: Payer: Self-pay | Admitting: Interventional Cardiology

## 2017-09-28 NOTE — Telephone Encounter (Signed)
Pt states he has swelling in ankles and feet.  Improves at night but comes right back in the morning.  Weight yesterday was 214lbs and today is 216lbs.  Pt said he had a really bad night last night.  Woke up in the middle of the night with terrible reflux, ate a bowl of grits and the reflux resolved.  Pt currently taking Furosemide 40mg  BID.  Pt concerned about swelling.  Denies other cardiac sx.  Advised pt I would send message to Dr. Tamala Julian to address swelling but he would need to contact PCP in regards to acid reflux.  Pt states he will hold off on this because he feels that if he gets the fluid off of him that his reflux will resolve.

## 2017-09-28 NOTE — Telephone Encounter (Signed)
New Message  Pt c/o swelling: STAT is pt has developed SOB within 24 hours  1) How much weight have you gained and in what time span? 3 lbs over night  2) If swelling, where is the swelling located? legs  3) Are you currently taking a fluid pill? yes  4) Are you currently SOB? no  5) Do you have a log of your daily weights (if so, list)? 212-215  6) Have you gained 3 pounds in a day or 5 pounds in a week? 3 pounds over night  7) Have you traveled recently? No  Pt states he is also having acid reflux

## 2017-09-29 DIAGNOSIS — H02051 Trichiasis without entropian right upper eyelid: Secondary | ICD-10-CM | POA: Diagnosis not present

## 2017-09-29 DIAGNOSIS — H02055 Trichiasis without entropian left lower eyelid: Secondary | ICD-10-CM | POA: Diagnosis not present

## 2017-09-29 DIAGNOSIS — H02054 Trichiasis without entropian left upper eyelid: Secondary | ICD-10-CM | POA: Diagnosis not present

## 2017-09-30 ENCOUNTER — Encounter: Payer: Medicare Other | Admitting: Internal Medicine

## 2017-09-30 NOTE — Telephone Encounter (Signed)
Please advise the patient to take 80 mg of furosemide as his next scheduled dose.  This should only be done once and back to his standard dose

## 2017-09-30 NOTE — Telephone Encounter (Signed)
Spoke with pt and made him aware of recommendations per Dr. Tamala Julian. Pt verbalized understanding.

## 2017-10-11 ENCOUNTER — Ambulatory Visit: Payer: Self-pay | Admitting: *Deleted

## 2017-10-11 NOTE — Telephone Encounter (Signed)
Patient has history of shortness of breath cough and congestion. Patient takes oxygen at his apartment. He states he has a pulse oximetry of 98 percent and a pulse of 64.Appointment made with Jodi Mourning for tomorrow at 1040.Advised if got worse to call 911  and or go to Er     Reason for Disposition . [1] MODERATE longstanding difficulty breathing (e.g., speaks in phrases, SOB even at rest, pulse 100-120) AND [2] SAME as normal  Answer Assessment - Initial Assessment Questions 1. RESPIRATORY STATUS: "Describe your breathing?" (e.g., wheezing, shortness of breath, unable to speak, severe coughing)       Shortness of breath  Wheezing   Severe  Coughing   2. ONSET: "When did this breathing problem begin?"       Couple  Weeks  Got  Worse  Today    3. PATTERN "Does the difficult breathing come and go, or has it been constant since it started?"        Constant   4. SEVERITY: "How bad is your breathing?" (e.g., mild, moderate, severe)    - MILD: No SOB at rest, mild SOB with walking, speaks normally in sentences, can lay down, no retractions, pulse < 100.    - MODERATE: SOB at rest, SOB with minimal exertion and prefers to sit, cannot lie down flat, speaks in phrases, mild retractions, audible wheezing, pulse 100-120.    - SEVERE: Very SOB at rest, speaks in single words, struggling to breathe, sitting hunched forward, retractions, pulse > 120      Moderate   5. RECURRENT SYMPTOM: "Have you had difficulty breathing before?" If so, ask: "When was the last time?" and "What happened that time?"      Copd  Chronic  Bronchitis    6. CARDIAC HISTORY: "Do you have any history of heart disease?" (e.g., heart attack, angina, bypass surgery, angioplasty)      Pacemaker   CHF   7. LUNG HISTORY: "Do you have any history of lung disease?"  (e.g., pulmonary embolus, asthma, emphysema)     COPD   8. CAUSE: "What do you think is causing the breathing problem?"       Pollen  Second hand  Smoke   9. OTHER  SYMPTOMS: "Do you have any other symptoms? (e.g., dizziness, runny nose, cough, chest pain, fever)    Dizzy    Weak   No  Fever   10. PREGNANCY: "Is there any chance you are pregnant?" "When was your last menstrual period?"       N/a  11. TRAVEL: "Have you traveled out of the country in the last month?" (e.g., travel history, exposures)      n/a  Protocols used: BREATHING DIFFICULTY-A-AH

## 2017-10-12 ENCOUNTER — Encounter: Payer: Self-pay | Admitting: Family

## 2017-10-12 ENCOUNTER — Ambulatory Visit (INDEPENDENT_AMBULATORY_CARE_PROVIDER_SITE_OTHER): Payer: Medicare Other | Admitting: Family

## 2017-10-12 VITALS — BP 108/60 | HR 74 | Temp 98.0°F | Ht 66.0 in | Wt 219.1 lb

## 2017-10-12 DIAGNOSIS — J449 Chronic obstructive pulmonary disease, unspecified: Secondary | ICD-10-CM

## 2017-10-12 DIAGNOSIS — R059 Cough, unspecified: Secondary | ICD-10-CM

## 2017-10-12 DIAGNOSIS — R05 Cough: Secondary | ICD-10-CM | POA: Diagnosis not present

## 2017-10-12 DIAGNOSIS — J9611 Chronic respiratory failure with hypoxia: Secondary | ICD-10-CM | POA: Diagnosis not present

## 2017-10-12 MED ORDER — AZITHROMYCIN 250 MG PO TABS: ORAL_TABLET | ORAL | 0 refills | Status: DC

## 2017-10-12 NOTE — Progress Notes (Signed)
Eric [name] is a 84 y.o. male with the following history as recorded in EpicCare:  Patient Active Problem List   Diagnosis Date Noted  . Erosion of penile prosthesis ([place]) 04/22/2017  . Bilateral pleural effusion 10/12/2016  . Chronic respiratory failure with hypoxia ([place]) 10/12/2016  . Goals of care, counseling/discussion 08/04/2016  . Anxiety state 02-19-202018  . Anemia in neoplastic disease 12/12/2015  . Acquired pancytopenia ([place] ) 10/28/2015  . Tachy-brady syndrome ([place]) 07/10/2015  . Bilateral carotid artery stenosis 07/10/2015  . Essential hypertension 07/10/2015  . CML (chronic myeloid leukemia) ([place]) 06/26/2015  . Myeloproliferative neoplasm ([place]) 06/25/2015  . Back pain 12/10/2014  . Obesity (BMI 30-39.9) 12/08/2014  . Hartmann's pouch of intestine ([place]) 12/08/2014  . CAD (coronary artery disease) of artery bypass graft 11/28/2014  . Chronic diastolic heart failure ([place]) 11/24/2014  . GERD (gastroesophageal reflux disease) 03/27/2014  . Generalized weakness 02/08/2014  . Obstructive sleep apnea 04/11/2011  . Type 2 diabetes mellitus, controlled ([place]) 01/18/2009  . Dyslipidemia 01/18/2009  . Hypertensive heart disease with CHF ([place]) 01/18/2009  . Coronary atherosclerosis 01/18/2009  . GERD 01/18/2009  . COPD mixed type ([place]) 12/27/2006    Current Outpatient Medications  Medication Sig Dispense Refill  . acetaminophen (TYLENOL) 500 MG tablet Take 1,000 mg by mouth every 6 (six) hours as needed for mild pain or fever.    . ALPRAZolam (XANAX) 0.25 MG tablet Take 1 tablet (0.25 mg total) by mouth daily as needed for anxiety. 30 tablet 1  . fluticasone (FLONASE) 50 MCG/ACT nasal spray Place 2 sprays into both nostrils daily. Allergies 16 g 1  . furosemide (LASIX) 40 MG tablet Take 1 tablet (40 mg total) by mouth 2 (two) times daily. 60 tablet 6  . glipiZIDE (GLUCOTROL) 5 MG tablet Take 2.5 mg 2 (two) times daily by mouth.    [name] glucose blood (ONE TOUCH ULTRA TEST) test  strip as directed    . guaiFENesin-dextromethorphan (ROBITUSSIN DM) 100-10 MG/5ML syrup Take 5 mLs by mouth every 4 (four) hours as needed for cough. 118 mL 0  . HYDROcodone-acetaminophen (NORCO) 10-325 MG tablet TAKE 1 TABLET BY MOUTH 4 TIMES DAILY AS NEEDED  0  . HYDROcodone-acetaminophen (NORCO) 7.5-325 MG tablet Take 1 tablet every 6 (six) hours as needed by mouth for moderate pain or severe pain. 20 tablet 0  . ipratropium-albuterol (DUONEB) 0.5-2.5 (3) MG/3ML SOLN INHALE 1 vial (3 msl) via NEBULIZER 3 TIMES DAILY AND EVERY 4 HOURS AS NEEDED  3  . nitroGLYCERIN (NITROSTAT) 0.4 MG SL tablet Place 0.4 mg under the tongue every 5 (five) minutes as needed for chest pain. Reported on 12/12/2015    . Polyethyl Glycol-Propyl Glycol (SYSTANE) 0.4-0.3 % SOLN Apply 2 drops to eye 3 (three) times daily as needed (dry eyes).     . potassium chloride SA (K-DUR,KLOR-CON) 20 MEQ tablet Take 20 mEq by mouth daily.  99  . pravastatin (PRAVACHOL) 20 MG tablet Take 60 mg by mouth daily.     . ranitidine (ZANTAC) 300 MG tablet Take 300 mg by mouth at bedtime.     . senna (SENOKOT) 8.6 MG tablet 2 tablets    . valsartan (DIOVAN) 160 MG tablet Take 160 mg by mouth daily.    [name] azithromycin (ZITHROMAX) 250 MG tablet 2 tabs po qd x 1 day; 1 tablet per day x 4 days; 6 tablet 0   No current facility-administered medications for this visit.     Allergies: Actos [pioglitazone hydrochloride]; Buprenorphine  hcl; Celebrex [celecoxib]; Demerol; Meperidine; Morphine and related; Ciprofloxacin; Metformin; and Zocor [simvastatin]  Past Medical History:  Diagnosis Date  . ALLERGIC RHINITIS   . ANEMIA-NOS   . AORTIC SCLEROSIS   . Asthma   . CARDIOMYOPATHY, ISCHEMIC   . CAROTID BRUIT, RIGHT 02/27/2008  . Cataract    surgery  . CML (chronic myeloid leukemia) ([place]) 06/26/2015  . COPD   . CORONARY ARTERY DISEASE    a. s/p CABG in 1995 b. DES in 2008, 2009, and most recent in 2012 with DES to SVG-OM  . DIABETES MELLITUS-TYPE  II    diet controlled  . Diastolic dysfunction, Grade 1 11/24/2014  . Diverticulitis of colon with perforation 11/22/2014  . Diverticulosis   . GERD   . HIATAL HERNIA   . Hx of echocardiogram    Echo (9/15):  Mild LVH, EF 50-55%, no RWMA, Gr 1 DD, MAC, mild LAE.  [name] HYPERLIPIDEMIA   . HYPERTENSION   . Hyponatremia 11/22/2014  . IBS (irritable bowel syndrome)   . LACTOSE INTOLERANCE   . OA (osteoarthritis)   . OBESITY   . On home oxygen therapy    "3L all the time" (10/12/2016)  . Partial small bowel obstruction ([place])   . PERIPHERAL VASCULAR DISEASE   . Primary hyperparathyroidism ([place])    Lab Results Component Value Date  PTH 150.7* 02/13/2013  CALCIUM 11.0* 02/13/2013  CAION 1.21 03/15/2008    . Prostate cancer ([place])    seed implants 2004  . SICK SINUS/ TACHY-BRADY SYNDROME 09/2007   s/p PPM st judes  . Sleep apnea   . SMALL BOWEL OBSTRUCTION 04/18/2009   Qualifier: History of  By: Asa Lente MD, Jannifer Rodney Symptomatic diverticulosis 01/18/2009   Qualifier: Diagnosis of  By: Shane Crutch, Amy S     Past Surgical History:  Procedure Laterality Date  . BACK SURGERY    . Bilateral cataracts    . COLON RESECTION N/A 11/28/2014   Procedure: EXPLORATORY LAPAROTOMY, SIGMOID COLECTOMY WITH COLOSTOMY;  Surgeon: Jackolyn Confer, MD;  [facility]: [facility];  Service: General;  Laterality: N/A;  . COLON SURGERY    . COLONOSCOPY    . CORONARY ARTERY BYPASS GRAFT    . ESOPHAGOGASTRODUODENOSCOPY  multiple  . FLEXIBLE SIGMOIDOSCOPY N/A 09/22/2013   Procedure: FLEXIBLE SIGMOIDOSCOPY;  Surgeon: Gatha Mayer, MD;  [facility];  Service: Endoscopy;  Laterality: N/A;  . INGUINAL HERNIA REPAIR Bilateral   . LUMBAR [place] SURGERY  12/2008  . PACEMAKER INSERTION     DDD/[employer]         Last interrogation 2/13  on chart     Pacemaker guideline order Dr Tamala Julian on chart  . Partial small bowel obstruction  2009  . PENILE PROSTHESIS PLACEMENT    . PTCA  2008, 2009, 2012   with DES  . REMOVAL  OF PENILE PROSTHESIS N/A 04/22/2017   Procedure: EXPLANT OF MULTICOMPONENT PENILE PROSTHESIS;  Surgeon: Irine Seal, MD;  [facility]: [facility];  Service: Urology;  Laterality: N/A;  . TOTAL HIP ARTHROPLASTY  08/21/2011   Procedure: TOTAL HIP ARTHROPLASTY;  Surgeon: Johnn Hai, MD;  [facility]: [facility];  Service: Orthopedics;  Laterality: Right;    Family History  Problem Relation Age of Onset  . Hypertension Mother   . Cancer Mother   . Heart attack Unknown   . Heart attack Unknown   . Heart attack Brother   . Colon cancer Neg Hx   . Stroke Neg Hx  Social History   Tobacco Use  . Smoking status: Former Smoker    Packs/day: 1.00    Years: 25.00    Pack years: 25.00    Types: Cigarettes    Last attempt to quit: 06/08/1994    Years since quitting: 23.3  . Smokeless tobacco: Never Used  Substance Use Topics  . Alcohol use: No    Alcohol/week: 0.0 oz    Subjective:  Patient presents with concerns for chronic cough; notes symptoms have been worse in the past week; feels symptoms are related to poor ventilation in his apartment- notes that neighbor has been smoking e-cigarettes inside and this is affecting his breathing; under care of pulmonology and wears oxygen and under care of cardiology; notes that since his neighbor has stopped vaping in the past 2 days, he has started to feel much better; notes he slept very well last night; has chronic shortness of breath- on oxygen/ under care of pulmonology; denies any fever; wonders if he could get another Rx for Z-pak to use if his symptoms return; feels this works better for him than prednisone;   Objective:  Vitals:   10/12/17 1054  BP: 108/60  Pulse: 74  Temp: 98 F (36.7 C)  TempSrc: Oral  SpO2: 98%  Weight: 219 lb 1.3 oz (99.4 kg)  Height: _0  (1.676 m)    General: Well developed, well nourished, in no acute distress  Skin : Warm and dry.  Head: Normocephalic and atraumatic  Eyes: Sclera and conjunctiva clear; pupils round  and reactive to light; extraocular movements intact  Ears: External normal; canals clear; tympanic membranes normal  Oropharynx: Pink, supple. No suspicious lesions  Neck: Supple without thyromegaly, adenopathy  Lungs: Respirations unlabored; wheezing noted in lower lobes CVS exam: normal rate and regular rhythm.  Neurologic: Alert and oriented; speech intact; face symmetrical; moves all extremities well; CNII-XII intact without focal deficit   Assessment:  1. Cough   2. COPD mixed type ([place])   3. Chronic respiratory failure with hypoxia (HCC)     Plan:  Rx for Z-pak- use as directed; follow-up with his pulmonologist if symptoms persist;   No follow-ups on file.  No orders of the defined types were placed in this encounter.   Requested Prescriptions   Signed Prescriptions Disp Refills  . azithromycin (ZITHROMAX) 250 MG tablet 6 tablet 0    Sig: 2 tabs po qd x 1 day; 1 tablet per day x 4 days;

## 2017-10-18 ENCOUNTER — Telehealth: Payer: Self-pay | Admitting: Interventional Cardiology

## 2017-10-18 NOTE — Telephone Encounter (Signed)
Spoke with pt and he states that he has DOE during the day with walking even with his O2 on.  States he has been waking up at night SOB, gets up and coughs up white or light yellow mucus and then is able to go back to bed.  Pt states he is still having swelling in lower extremities.  Scheduled pt to come in tomorrow at 2pm to see Truitt Merle, NP.  Pt verbalized understanding and was in agreement with this plan.

## 2017-10-18 NOTE — Telephone Encounter (Signed)
Fax received from [facility].  Fax states pt has gained 8.2lbs in 1.80mos.  Pt is SOB, coughing up phlegm and has pedal edema.  Pt declined going to ER to [facility] rep.  Called pt and left message to call back.

## 2017-10-19 ENCOUNTER — Encounter: Payer: Self-pay | Admitting: Nurse Practitioner

## 2017-10-19 ENCOUNTER — Ambulatory Visit (INDEPENDENT_AMBULATORY_CARE_PROVIDER_SITE_OTHER): Payer: Medicare Other | Admitting: Nurse Practitioner

## 2017-10-19 VITALS — BP 132/56 | HR 69 | Ht 66.0 in | Wt 222.0 lb

## 2017-10-19 DIAGNOSIS — I5032 Chronic diastolic (congestive) heart failure: Secondary | ICD-10-CM

## 2017-10-19 DIAGNOSIS — I5031 Acute diastolic (congestive) heart failure: Secondary | ICD-10-CM | POA: Diagnosis not present

## 2017-10-19 DIAGNOSIS — I6523 Occlusion and stenosis of bilateral carotid arteries: Secondary | ICD-10-CM

## 2017-10-19 MED ORDER — PREDNISONE 10 MG PO TABS: 10.0000 mg | ORAL_TABLET | Freq: Every day | ORAL | 0 refills | Status: DC

## 2017-10-19 MED ORDER — AMOXICILLIN 500 MG PO TABS: 500.0000 mg | ORAL_TABLET | Freq: Two times a day (BID) | ORAL | 0 refills | Status: DC

## 2017-10-19 NOTE — Patient Instructions (Addendum)
We will be checking the following labs today - BMET. CBC and BNP   Medication Instructions:    Continue with your current medicines. BUT  I am sending in 2 prescriptions - one for Amoxicillin 500 mg to take twice a day for a week and one for Prednisone 10 mg to take daily for 5 days.     Testing/Procedures To Be Arranged:  N/A  Follow-Up:   See Dr. Caryl Comes as planned  Will get you a visit with Dr. Annamaria Boots or his associate about your breathing.      Other Special Instructions:   N/A    If you need a refill on your cardiac medications before your next appointment, please call your pharmacy.   Call the [place] office at [phone] if you have any questions, problems or concerns.

## 2017-10-19 NOTE — Progress Notes (Signed)
CARDIOLOGY OFFICE NOTE  Date:  10/19/2017    Eric [name] Date of Birth: [birth year] Medical Record #[id number]  PCP:  [name], MD  Cardiologist:  Tamala Julian  Chief Complaint  Patient presents with  . Congestive Heart Failure  . Shortness of Breath    Work in visit -seen for Dr. Tamala Julian    History of Present Illness: Eric [name] is a 84 y.o. male who presents today for a work in visit. Seen for Dr. Tamala Julian.   He has a history of CAD, prior remote coronary bypass grafting in 1995, prior DES in 2008, 2009, and 2012, bilateral carotid disease, essential hypertension, hyperlipidemia, and diabetes mellitus.  Last seen back in February - on chronic O2. Appeared weak and unstable. No angina. Unable to get his records - most of his care is now with [facility] in [place].   Has called twice since with swelling - noted excessive salt use (eating at [employer]) - needed additional diuretics short term.   Phone call earlier this week - "Spoke with pt and he states that he has DOE during the day with walking even with his O2 on.  States he has been waking up at night SOB, gets up and coughs up white or light yellow mucus and then is able to go back to bed.  Pt states he is still having swelling in lower extremities.  Scheduled pt to come in tomorrow at 2pm to see Truitt Merle, NP.  Pt verbalized understanding and was in agreement with this plan".   Thus added to my schedule for today.   Comes in today. Here alone. He says he is "always" short of breath, his weight is up - he has no issue with this. Seems as if [employer] got this visit for him - he really does not know why he is here. Adamant right off the bat that he will not go to the hospital.  Says this is no worse than how he was a year ago when admitted with CHF in May of 2018. He gets short of breath with lying down - now on 2 pillows but has used this for about a year. Some PND last week. Does not sound like this has really  changed for him. He says he eats "all the time". Hard to say how much salt he is using - he gets Meals on Wheels for one meal a day and is otherwise using the microwave - he stopped fast food - too expensive. No chest pain. Lots of congestion - he has had a round of antibiotics - says this has not helped - still coughing yellow sputum. No fever. Still driving. His youngest daughter died 2 weeks ago at [facility] - she was on dialysis and morbidly obese.  Seems like there is some family stress/strife going on.   Past Medical History:  Diagnosis Date  . ALLERGIC RHINITIS   . ANEMIA-NOS   . AORTIC SCLEROSIS   . Asthma   . CARDIOMYOPATHY, ISCHEMIC   . CAROTID BRUIT, RIGHT 02/27/2008  . Cataract    surgery  . CML (chronic myeloid leukemia) ([place]) 06/26/2015  . COPD   . CORONARY ARTERY DISEASE    a. s/p CABG in 1995 b. DES in 2008, 2009, and most recent in 2012 with DES to SVG-OM  . DIABETES MELLITUS-TYPE II    diet controlled  . Diastolic dysfunction, Grade 1 11/24/2014  . Diverticulitis of colon with perforation 11/22/2014  . Diverticulosis   .  GERD   . HIATAL HERNIA   . Hx of echocardiogram    Echo (9/15):  Mild LVH, EF 50-55%, no RWMA, Gr 1 DD, MAC, mild LAE.  [name] HYPERLIPIDEMIA   . HYPERTENSION   . Hyponatremia 11/22/2014  . IBS (irritable bowel syndrome)   . LACTOSE INTOLERANCE   . OA (osteoarthritis)   . OBESITY   . On home oxygen therapy    "3L all the time" (10/12/2016)  . Partial small bowel obstruction ([place])   . PERIPHERAL VASCULAR DISEASE   . Primary hyperparathyroidism ([place])    Lab Results Component Value Date  PTH 150.7* 02/13/2013  CALCIUM 11.0* 02/13/2013  CAION 1.21 03/15/2008    . Prostate cancer ([place])    seed implants 2004  . SICK SINUS/ TACHY-BRADY SYNDROME 09/2007   s/p PPM st judes  . Sleep apnea   . SMALL BOWEL OBSTRUCTION 04/18/2009   Qualifier: History of  By: Asa Lente MD, Jannifer Rodney Symptomatic diverticulosis 01/18/2009   Qualifier: Diagnosis of  By: Shane Crutch, Amy  S     Past Surgical History:  Procedure Laterality Date  . BACK SURGERY    . Bilateral cataracts    . COLON RESECTION N/A 11/28/2014   Procedure: EXPLORATORY LAPAROTOMY, SIGMOID COLECTOMY WITH COLOSTOMY;  Surgeon: Jackolyn Confer, MD;  [facility];  Service: General;  Laterality: N/A;  . COLON SURGERY    . COLONOSCOPY    . CORONARY ARTERY BYPASS GRAFT    . ESOPHAGOGASTRODUODENOSCOPY  multiple  . FLEXIBLE SIGMOIDOSCOPY N/A 09/22/2013   Procedure: FLEXIBLE SIGMOIDOSCOPY;  Surgeon: Gatha Mayer, MD;  [facility];  Service: Endoscopy;  Laterality: N/A;  . INGUINAL HERNIA REPAIR Bilateral   . LUMBAR [place] SURGERY  12/2008  . PACEMAKER INSERTION     DDD/[employer]         Last interrogation 2/13  on chart     Pacemaker guideline order Dr Tamala Julian on chart  . Partial small bowel obstruction  2009  . PENILE PROSTHESIS PLACEMENT    . PTCA  2008, 2009, 2012   with DES  . REMOVAL OF PENILE PROSTHESIS N/A 04/22/2017   Procedure: EXPLANT OF MULTICOMPONENT PENILE PROSTHESIS;  Surgeon: Irine Seal, MD;  [facility];  Service: Urology;  Laterality: N/A;  . TOTAL HIP ARTHROPLASTY  08/21/2011   Procedure: TOTAL HIP ARTHROPLASTY;  Surgeon: Johnn Hai, MD;  [facility];  Service: Orthopedics;  Laterality: Right;     Medications: Current Meds  Medication Sig  . acetaminophen (TYLENOL) 500 MG tablet Take 1,000 mg by mouth every 6 (six) hours as needed for mild pain or fever.  . ALPRAZolam (XANAX) 0.25 MG tablet Take 1 tablet (0.25 mg total) by mouth daily as needed for anxiety.  . fluticasone (FLONASE) 50 MCG/ACT nasal spray Place 2 sprays into both nostrils daily. Allergies  . furosemide (LASIX) 40 MG tablet Take 1 tablet (40 mg total) by mouth 2 (two) times daily.  [name] glipiZIDE (GLUCOTROL) 5 MG tablet Take 2.5 mg 2 (two) times daily by mouth.  [name] glucose blood (ONE TOUCH ULTRA TEST) test strip as directed  . guaiFENesin-dextromethorphan (ROBITUSSIN DM) 100-10 MG/5ML  syrup Take 5 mLs by mouth every 4 (four) hours as needed for cough.  [name] HYDROcodone-acetaminophen (NORCO) 7.5-325 MG tablet Take 1 tablet every 6 (six) hours as needed by mouth for moderate pain or severe pain.  [name] ipratropium-albuterol (DUONEB) 0.5-2.5 (3) MG/3ML SOLN INHALE 1 vial (3 msl) via NEBULIZER 3  TIMES DAILY AND EVERY 4 HOURS AS NEEDED  . nitroGLYCERIN (NITROSTAT) 0.4 MG SL tablet Place 0.4 mg under the tongue every 5 (five) minutes as needed for chest pain. Reported on 12/12/2015  . NON FORMULARY Place 2 L into the nose continuous.  Vladimir Faster Glycol-Propyl Glycol (SYSTANE) 0.4-0.3 % SOLN Apply 2 drops to eye 3 (three) times daily as needed (dry eyes).   . potassium chloride SA (K-DUR,KLOR-CON) 20 MEQ tablet Take 20 mEq by mouth daily.  . pravastatin (PRAVACHOL) 20 MG tablet Take 60 mg by mouth daily.   . ranitidine (ZANTAC) 300 MG tablet Take 300 mg by mouth at bedtime.   . senna (SENOKOT) 8.6 MG tablet 2 tablets  . valsartan (DIOVAN) 160 MG tablet Take 160 mg by mouth daily.  . [DISCONTINUED] azithromycin (ZITHROMAX) 250 MG tablet 2 tabs po qd x 1 day; 1 tablet per day x 4 days;     Allergies: Allergies  Allergen Reactions  . Actos [Pioglitazone Hydrochloride] Other (See Comments)    "felt funny, drowsy, and weak":  [name] Buprenorphine Hcl Nausea And Vomiting  . Celebrex [Celecoxib] Other (See Comments)    "felt funny"  . Demerol Palpitations and Other (See Comments)    Increased BP  . Meperidine Palpitations    Other reaction(s): Other (See Comments) Increased BP  . Morphine And Related Nausea And Vomiting  . Ciprofloxacin Other (See Comments)    arthralgia  . Metformin Nausea And Vomiting  . Zocor [Simvastatin] Other (See Comments)    Makes pt very drowsy    Social History: The patient  reports that he quit smoking about 23 years ago. His smoking use included cigarettes. He has a 25.00 pack-year smoking history. He has never used smokeless tobacco. He reports that he does  not drink alcohol or use drugs.   Family History: The patient's family history includes Cancer in his mother; Heart attack in his brother, unknown relative, and unknown relative; Hypertension in his mother.   Review of Systems: Please see the history of present illness.   Otherwise, the review of systems is positive for none.   All other systems are reviewed and negative.   Physical Exam: VS:  BP (!) 132/56 (BP Location: Left Arm, Patient Position: Sitting, Cuff Size: Normal)   Pulse 69   Ht 5' 6"  (1.676 m)   Wt 222 lb (100.7 kg)   SpO2 97% Comment: 2 L  BMI 35.83 kg/m  .  BMI Body mass index is 35.83 kg/m.  Wt Readings from Last 3 Encounters:  10/19/17 222 lb (100.7 kg)  10/12/17 219 lb 1.3 oz (99.4 kg)  08/19/17 214 lb (97.1 kg)    General: Pleasant. Obese. Elderly and chronically ill appearing but alert and in no acute distress.  His weight is up 8 pounds since last visit here in February.  HEENT: Normal.  Neck: Supple, no JVD, carotid bruits, or masses noted.  Cardiac: Regular rate and rhythm. Heart tones are distant. No read pedal edema that I appreciate.   Respiratory:  Lungs with diffuse wheezing - congested cough - productive of yellow sputum - noted during the exam. He has oxygen in place.   GI: Soft and nontender.  MS: No deformity or atrophy. Gait and ROM intact but moving slow.  Skin: Warm and dry. Color is pale Neuro:  Strength and sensation are intact and no gross focal deficits noted.  Psych: Alert, appropriate and with normal affect.   LABORATORY DATA:  EKG:  EKG is  ordered today. This demonstrates NSR  Lab Results  Component Value Date   WBC 6.8 04/16/2017   HGB 7.9 (L) 04/23/2017   HCT 24.8 (L) 04/23/2017   PLT 212 04/16/2017   GLUCOSE 135 (H) 04/23/2017   CHOL 108 05/27/2015   TRIG 151.0 (H) 05/27/2015   HDL 22.70 (L) 05/27/2015   LDLCALC 55 05/27/2015   ALT 9 03/12/2017   AST 14 03/12/2017   NA 136 04/23/2017   K 5.1 04/23/2017   CL 101  04/23/2017   CREATININE 1.33 (H) 04/23/2017   BUN 26 (H) 04/23/2017   CO2 26 04/23/2017   TSH 1.754 05/19/2016   PSA 0.15 04/09/2015   INR 1.00 08/13/2011   HGBA1C 6.5 (H) 04/16/2017   MICROALBUR 0.8 09/30/2012     BNP (last 3 results) No results for input(s): BNP in the last 8760 hours.  ProBNP (last 3 results) No results for input(s): PROBNP in the last 8760 hours.   Other Studies Reviewed Today:  Echo Study Conclusions 2017  - Left ventricle: The cavity size was normal. Wall thickness was   increased in a pattern of mild LVH. Systolic function was normal.   The estimated ejection fraction was in the range of 55% to 65%.   Wall motion was normal; there were no regional wall motion   abnormalities. Doppler parameters are consistent with abnormal   left ventricular relaxation (grade 1 diastolic dysfunction). - Aortic valve: Mildly calcified annulus. Mildly calcified   leaflets. Valve area (VTI): 1.9 cm^2. Valve area (Vmax): 1.88   cm^2. Valve area (Vmean): 1.71 cm^2.  Assessment/Plan:  1. Shortness of breath - seems more like a COPD exacerbation - will give him a round of Amoxicillin and a short course of Prednisone - he used this back in January and notes good response. Will get him a visit with pulmonary. Probably needs CXR but he is not willing to go and have this done today.   2. CAD - remote CABG - prior DES - last in 2012 - he is not having any chest pain - would favor conservative management.   3. Chronic diastolic HF - check BNP today - his weight is up - he is quite sedentary - unclear how much salt he is getting. Sputum is yellow and he is wheezing. Will see what the labs show but for now stay on current dose of Lasix BID.   4. Underlying PPM due to tachy-brady - has EP follow up.   5. Advanced age  Current medicines are reviewed with the patient today.  The patient does not have concerns regarding medicines other than what has been noted above.  The  following changes have been made:  See above.  Labs/ tests ordered today include:    Orders Placed This Encounter  Procedures  . Basic metabolic panel  . CBC  . Pro b natriuretic peptide (BNP)  . EKG 12-Lead     Disposition:   FU with Dr. Caryl Comes and Dr. Tamala Julian as planned.  Further disposition to follow after labs are reviewed.   Patient is agreeable to this plan and will call if any problems develop in the interim.   SignedTruitt Merle, NP  10/19/2017 2:37 PM  [place] [address] [place], [place]  [address] Phone: [phone] Fax: [phone]

## 2017-10-20 LAB — BASIC METABOLIC PANEL
BUN/Creatinine Ratio: 24 (ref 10–24)
BUN: 26 mg/dL (ref 8–27)
CO2: 23 mmol/L (ref 20–29)
Calcium: 9.7 mg/dL (ref 8.6–10.2)
Chloride: 100 mmol/L (ref 96–106)
Creatinine, Ser: 1.07 mg/dL (ref 0.76–1.27)
GFR calc Af Amer: 71 mL/min/{1.73_m2} (ref >59)
GFR calc non Af Amer: 62 mL/min/{1.73_m2} (ref >59)
Glucose: 155 mg/dL — ABNORMAL HIGH (ref 65–99)
Potassium: 4.9 mmol/L (ref 3.5–5.2)
Sodium: 140 mmol/L (ref 134–144)

## 2017-10-20 LAB — CBC
Hematocrit: 28.2 % — ABNORMAL LOW (ref 37.5–51.0)
Hemoglobin: 9 g/dL — ABNORMAL LOW (ref 13.0–17.7)
MCH: 30.7 pg (ref 26.6–33.0)
MCHC: 31.9 g/dL (ref 31.5–35.7)
MCV: 96 fL (ref 79–97)
Platelets: 155 10*3/uL (ref 150–379)
RBC: 2.93 x10E6/uL — ABNORMAL LOW (ref 4.14–5.80)
RDW: 15.2 % (ref 12.3–15.4)
WBC: 5.4 10*3/uL (ref 3.4–10.8)

## 2017-10-20 LAB — PRO B NATRIURETIC PEPTIDE: NT-Pro BNP: 473 pg/mL (ref 0–486)

## 2017-10-21 ENCOUNTER — Telehealth: Payer: Self-pay | Admitting: Nurse Practitioner

## 2017-10-21 ENCOUNTER — Ambulatory Visit (INDEPENDENT_AMBULATORY_CARE_PROVIDER_SITE_OTHER): Payer: Medicare Other | Admitting: Adult Health

## 2017-10-21 ENCOUNTER — Encounter: Payer: Self-pay | Admitting: Adult Health

## 2017-10-21 ENCOUNTER — Ambulatory Visit (INDEPENDENT_AMBULATORY_CARE_PROVIDER_SITE_OTHER)
Admission: RE | Admit: 2017-10-21 | Discharge: 2017-10-21 | Disposition: A | Discharge status: Home / Self Care | Payer: Medicare Other | Source: Ambulatory Visit | Attending: Adult Health | Admitting: Adult Health

## 2017-10-21 VITALS — BP 130/64 | HR 61 | Temp 97.0°F | Ht 66.0 in | Wt 220.6 lb

## 2017-10-21 DIAGNOSIS — J449 Chronic obstructive pulmonary disease, unspecified: Secondary | ICD-10-CM | POA: Diagnosis not present

## 2017-10-21 DIAGNOSIS — I6523 Occlusion and stenosis of bilateral carotid arteries: Secondary | ICD-10-CM

## 2017-10-21 DIAGNOSIS — R05 Cough: Secondary | ICD-10-CM | POA: Diagnosis not present

## 2017-10-21 DIAGNOSIS — J9611 Chronic respiratory failure with hypoxia: Secondary | ICD-10-CM

## 2017-10-21 NOTE — Assessment & Plan Note (Signed)
Resolving flare - check cxr today  Appears clinically stable   Plan  Patient Instructions  Chest xray today .  Finish Amoxicillin and Prednisone as directed.  Robitussin-DM as needed for cough and congestion Continue on DuoNeb 3 times daily and as needed Continue on oxygen Ordered for POC Follow-up with Dr. Annamaria Boots in 2 to 3 months and as needed

## 2017-10-21 NOTE — Assessment & Plan Note (Signed)
Cont on O2 .  POC order .

## 2017-10-21 NOTE — Progress Notes (Signed)
_0  ID: Eric [name], male    DOB: 29-Jan-1927, 84 y.o.   MRN: [id number]  Chief Complaint  Patient presents with  . Follow-up    Bronchitis     Referring provider: [name], *  HPI: 84 year old male former smoker veteran followed for Asthma /COPD , OSA and Hypoxic Respiratory failure  Past medical history significant for diabetes, coronary artery disease, cardiomyopathy with pacemaker, prostate cancer, permanent colostomy/diverticulitis  10/21/2017 Follow up : Asthma /bronchitis Patient returns for a follow-up.  Patient was recently seen by cardiology.  And had increased cough and congestion. Has had cough and congestion for 2-3 weeks. Initially seen by PCP , given Zpack and predniosne . Symptoms continued and was seen 2 days ago at cardiology   Was started on amoxicillin 500 mg twice daily for 1 week and prednisone 10 mg daily for 5 days.  Patient is feeling some better. Taking Robitussin and seems to help with cough .  Denies fever, hemoptyiss or increased edema.  Eating good with n/v/d.  Remains on Duoneb Three times a day  .  Remains on Oxygen 2-3 l/m . Wants a portable tank so he can be more mobile and can carry easier.      Allergies  Allergen Reactions  . Actos [Pioglitazone Hydrochloride] Other (See Comments)    "felt funny, drowsy, and weak":  [name] Buprenorphine Hcl Nausea And Vomiting  . Celebrex [Celecoxib] Other (See Comments)    "felt funny"  . Demerol Palpitations and Other (See Comments)    Increased BP  . Meperidine Palpitations    Other reaction(s): Other (See Comments) Increased BP  . Morphine And Related Nausea And Vomiting  . Ciprofloxacin Other (See Comments)    arthralgia  . Metformin Nausea And Vomiting  . Zocor [Simvastatin] Other (See Comments)    Makes pt very drowsy    Immunization History  Administered Date(s) Administered  . Influenza Split 06/09/2011, 03/01/2012, 04/08/2014  . Influenza, High Dose Seasonal PF  02/20/2016  . Influenza,inj,Quad PF,6+ Mos 02/02/2013, 01/23/2014, 03/12/2015  . Influenza,inj,quad, With Preservative 02/06/2017  . Pneumococcal Conjugate-13 05/23/2014  . Pneumococcal Polysaccharide-23 02/27/2008  . Pneumococcal-Unspecified 06/10/2015  . Td 08/07/2008    Past Medical History:  Diagnosis Date  . ALLERGIC RHINITIS   . ANEMIA-NOS   . AORTIC SCLEROSIS   . Asthma   . CARDIOMYOPATHY, ISCHEMIC   . CAROTID BRUIT, RIGHT 02/27/2008  . Cataract    surgery  . CML (chronic myeloid leukemia) ([place]) 06/26/2015  . COPD   . CORONARY ARTERY DISEASE    a. s/p CABG in 1995 b. DES in 2008, 2009, and most recent in 2012 with DES to SVG-OM  . DIABETES MELLITUS-TYPE II    diet controlled  . Diastolic dysfunction, Grade 1 11/24/2014  . Diverticulitis of colon with perforation 11/22/2014  . Diverticulosis   . GERD   . HIATAL HERNIA   . Hx of echocardiogram    Echo (9/15):  Mild LVH, EF 50-55%, no RWMA, Gr 1 DD, MAC, mild LAE.  [name] HYPERLIPIDEMIA   . HYPERTENSION   . Hyponatremia 11/22/2014  . IBS (irritable bowel syndrome)   . LACTOSE INTOLERANCE   . OA (osteoarthritis)   . OBESITY   . On home oxygen therapy    "3L all the time" (10/12/2016)  . Partial small bowel obstruction ([place])   . PERIPHERAL VASCULAR DISEASE   . Primary hyperparathyroidism [facility])    Lab Results Component Value Date  PTH 150.7* 02/13/2013  CALCIUM  11.0* 02/13/2013  CAION 1.21 03/15/2008    . Prostate cancer ([place])    seed implants 2004  . SICK SINUS/ TACHY-BRADY SYNDROME 09/2007   s/p PPM st judes  . Sleep apnea   . SMALL BOWEL OBSTRUCTION 04/18/2009   Qualifier: History of  By: Asa Lente MD, Jannifer Rodney Symptomatic diverticulosis 01/18/2009   Qualifier: Diagnosis of  By: Trellis Paganini PA-c, Amy S     Tobacco History: Social History   Tobacco Use  Smoking Status Former Smoker  . Packs/day: 1.00  . Years: 25.00  . Pack years: 25.00  . Types: Cigarettes  . Last attempt to quit: 06/08/1994  . Years since quitting:  23.3  Smokeless Tobacco Never Used   Counseling given: Not Answered   Outpatient Encounter Medications as of 10/21/2017  Medication Sig  . acetaminophen (TYLENOL) 500 MG tablet Take 1,000 mg by mouth every 6 (six) hours as needed for mild pain or fever.  . ALPRAZolam (XANAX) 0.25 MG tablet Take 1 tablet (0.25 mg total) by mouth daily as needed for anxiety.  [name] amoxicillin (AMOXIL) 500 MG tablet Take 1 tablet (500 mg total) by mouth 2 (two) times daily.  . fluticasone (FLONASE) 50 MCG/ACT nasal spray Place 2 sprays into both nostrils daily. Allergies  . furosemide (LASIX) 40 MG tablet Take 1 tablet (40 mg total) by mouth 2 (two) times daily.  [name] glipiZIDE (GLUCOTROL) 5 MG tablet Take 2.5 mg 2 (two) times daily by mouth.  [name] glucose blood (ONE TOUCH ULTRA TEST) test strip as directed  . guaiFENesin-dextromethorphan (ROBITUSSIN DM) 100-10 MG/5ML syrup Take 5 mLs by mouth every 4 (four) hours as needed for cough.  [name] HYDROcodone-acetaminophen (NORCO) 7.5-325 MG tablet Take 1 tablet every 6 (six) hours as needed by mouth for moderate pain or severe pain.  [name] ipratropium-albuterol (DUONEB) 0.5-2.5 (3) MG/3ML SOLN INHALE 1 vial (3 msl) via NEBULIZER 3 TIMES DAILY AND EVERY 4 HOURS AS NEEDED  . nitroGLYCERIN (NITROSTAT) 0.4 MG SL tablet Place 0.4 mg under the tongue every 5 (five) minutes as needed for chest pain. Reported on 12/12/2015  . NON FORMULARY Place 2 L into the nose continuous.  Vladimir Faster Glycol-Propyl Glycol (SYSTANE) 0.4-0.3 % SOLN Apply 2 drops to eye 3 (three) times daily as needed (dry eyes).   . potassium chloride SA (K-DUR,KLOR-CON) 20 MEQ tablet Take 20 mEq by mouth daily.  . pravastatin (PRAVACHOL) 20 MG tablet Take 60 mg by mouth daily.   . predniSONE (DELTASONE) 10 MG tablet Take 1 tablet (10 mg total) by mouth daily with breakfast.  . ranitidine (ZANTAC) 300 MG tablet Take 300 mg by mouth at bedtime.   . senna (SENOKOT) 8.6 MG tablet 2 tablets  . valsartan (DIOVAN) 160 MG tablet  Take 160 mg by mouth daily.   No facility-administered encounter medications on file as of 10/21/2017.      Review of Systems  Constitutional:   No  weight loss, night sweats,  Fevers, chills, fatigue, or  lassitude.  HEENT:   No headaches,  Difficulty swallowing,  Tooth/dental problems, or  Sore throat,                No sneezing, itching, ear ache,  +nasal congestion, post nasal drip,   CV:  No chest pain,  Orthopnea, PND, swelling in lower extremities, anasarca, dizziness, palpitations, syncope.   GI  No heartburn, indigestion, abdominal pain, nausea, vomiting, diarrhea, change in bowel habits, loss of appetite, bloody stools.   Resp:  .  No chest wall deformity  Skin: no rash or lesions.  GU: no dysuria, change in color of urine, no urgency or frequency.  No flank pain, no hematuria   MS:  No joint pain or swelling.  No decreased range of motion.  No back pain.    Physical Exam  BP 130/64 (BP Location: Left Arm, Cuff Size: Normal)   Pulse 61   Temp (!) 97 F (36.1 C) (Oral)   Ht _0  (1.676 m)   Wt 220 lb 9.6 oz (100.1 kg)   SpO2 99%   BMI 35.61 kg/m   GEN: A/Ox3; pleasant , NAD, obese elderly    HEENT:  [place]/AT,  EACs-clear, TMs-wnl, NOSE-clear, THROAT-clear, no lesions, no postnasal drip or exudate noted.   NECK:  Supple w/ fair ROM; no JVD; normal carotid impulses w/o bruits; no thyromegaly or nodules palpated; no lymphadenopathy.    RESP  Few trace rhonchi  . no accessory muscle use, no dullness to percussion  CARD:  RRR, no m/r/g, no peripheral edema, pulses intact, no cyanosis or clubbing.  GI:   Soft & nt; nml bowel sounds; no organomegaly or masses detected.   Musco: Warm bil, no deformities or joint swelling noted.   Neuro: alert, no focal deficits noted.    Skin: Warm, no lesions or rashes    Lab Results:   BNP  Imaging: No results found.   Assessment & Plan:   COPD mixed type ([place]) Resolving flare - check cxr today  Appears  clinically stable   Plan  Patient Instructions  Chest xray today .  Finish Amoxicillin and Prednisone as directed.  Robitussin-DM as needed for cough and congestion Continue on DuoNeb 3 times daily and as needed Continue on oxygen Ordered for POC Follow-up with Dr. Annamaria Boots in 2 to 3 months and as needed      Chronic respiratory failure with hypoxia ([place]) Cont on O2 .  POC order .      Rexene Edison, NP 10/21/2017

## 2017-10-21 NOTE — Telephone Encounter (Signed)
New message  Pt verbalized that he is returning call for rn   Notes recorded by Tamsen Snider on 10/20/2017 at 2:39 PM EDT Left message on machine for pt to contact the office.  ------

## 2017-10-21 NOTE — Patient Instructions (Addendum)
Chest xray today .  Finish Amoxicillin and Prednisone as directed.  Robitussin-DM as needed for cough and congestion Continue on DuoNeb 3 times daily and as needed Continue on oxygen Ordered for POC Follow-up with Dr. Annamaria Boots in 2 to 3 months and as needed

## 2017-10-21 NOTE — Telephone Encounter (Signed)
Spoke with pt and went over results and recommendations.  Pt verbalized understanding and will contact PCP tomorrow.  Pt appreciative for call.

## 2017-10-22 ENCOUNTER — Telehealth: Payer: Self-pay

## 2017-10-22 NOTE — Telephone Encounter (Signed)
Copied from [place] [id number]. Topic: Quick Communication - See Telephone Encounter >> Oct 21, 2017  5:02 PM Neva Seat wrote: Pt calling office to check if his results have been sent to the office from his cardiologist. Please call pt back on his home phone - [phone] (H)  - ASAP >> Oct 22, 2017  9:12 AM Synthia Innocent wrote: Patient calling back, checking status.

## 2017-10-22 NOTE — Telephone Encounter (Signed)
Appointment scheduled.

## 2017-10-22 NOTE — Telephone Encounter (Signed)
Can you please make an appointment for patient. We do have all the results form Cardiology but he is supposed to follow up with PCP about his anemia. Thank you

## 2017-10-25 ENCOUNTER — Other Ambulatory Visit (INDEPENDENT_AMBULATORY_CARE_PROVIDER_SITE_OTHER): Payer: Medicare Other

## 2017-10-25 ENCOUNTER — Ambulatory Visit (INDEPENDENT_AMBULATORY_CARE_PROVIDER_SITE_OTHER): Payer: Medicare Other | Admitting: Internal Medicine

## 2017-10-25 ENCOUNTER — Encounter: Payer: Self-pay | Admitting: Internal Medicine

## 2017-10-25 VITALS — BP 132/64 | HR 63 | Temp 97.9°F | Ht 66.0 in | Wt 221.0 lb

## 2017-10-25 DIAGNOSIS — E538 Deficiency of other specified B group vitamins: Secondary | ICD-10-CM | POA: Diagnosis not present

## 2017-10-25 DIAGNOSIS — C9210 Chronic myeloid leukemia, BCR/ABL-positive, not having achieved remission: Secondary | ICD-10-CM

## 2017-10-25 DIAGNOSIS — I6523 Occlusion and stenosis of bilateral carotid arteries: Secondary | ICD-10-CM

## 2017-10-25 LAB — VITAMIN B12: Vitamin B-12: 607 pg/mL (ref 211–911)

## 2017-10-25 LAB — FERRITIN: FERRITIN: 61.9 ng/mL (ref 22.0–322.0)

## 2017-10-25 NOTE — Patient Instructions (Signed)
We are checking the iron level today.

## 2017-10-25 NOTE — Progress Notes (Signed)
   Subjective:    Patient ID: Eric [name], male    DOB: [birth year], 84 y.o.   MRN: [id number]  HPI The patient is an 84 YO man coming in for low hemoglobin detected by cardiology. He does have chronic AML leukemia. He tried treatment which caused him too many side effects to continue. He has stable Hg levels. Some bruising with minimal trauma. He denies coughing up blood, blood in stool. Denies lightheadedness or worsening SOB.   Review of Systems  Constitutional: Negative.   Respiratory: Positive for cough and shortness of breath. Negative for chest tightness.        Improving  Cardiovascular: Negative for chest pain, palpitations and leg swelling.  Gastrointestinal: Negative for abdominal distention, abdominal pain, constipation, diarrhea, nausea and vomiting.  Musculoskeletal: Negative.   Skin: Negative.   Neurological: Negative.   Hematological: Bruises/bleeds easily.      Objective:   Physical Exam  Constitutional: He is oriented to person, place, and time. He appears well-developed and well-nourished.  HENT:  Head: Normocephalic and atraumatic.  Eyes: EOM are normal.  Neck: Normal range of motion.  Cardiovascular: Normal rate and regular rhythm.  Pulmonary/Chest: Effort normal. No respiratory distress. He has wheezes. He has no rales.  Chronic stable lung changes  Abdominal: Soft. He exhibits no distension. There is no tenderness. There is no rebound.  Musculoskeletal: He exhibits no edema.  Neurological: He is alert and oriented to person, place, and time. Coordination abnormal.  Cane for ambulation  Skin: Skin is warm and dry.   Vitals:   10/25/17 0916  BP: 132/64  Pulse: 63  Temp: 97.9 F (36.6 C)  TempSrc: Oral  SpO2: 99%  Weight: 221 lb (100.2 kg)  Height: 5\' 6"  (1.676 m)      Assessment & Plan:

## 2017-10-25 NOTE — Assessment & Plan Note (Signed)
He does have chronic anemia from his CML. Hemoglobin levels are stable to improved from prior. Will check iron and B12 levels as none recently. No indication for change to treatment currently. Should have routine monitoring every 3 months.

## 2017-10-26 NOTE — Addendum Note (Signed)
Addended by: Parke Poisson E on: 10/26/2017 11:14 AM   Modules accepted: Orders

## 2017-10-27 ENCOUNTER — Encounter: Payer: Self-pay | Admitting: Internal Medicine

## 2017-10-27 ENCOUNTER — Other Ambulatory Visit: Payer: Self-pay | Admitting: Internal Medicine

## 2017-10-27 NOTE — Telephone Encounter (Signed)
Refill refill request One touch ultra test strips  LOV 10/25/2017 Dr Sharlet Salina   Last filled -  As directed  No quantity  Ordered on 10/12/2017  - Historical provider  -  Office  Visit on that day  With [place]

## 2017-10-27 NOTE — Progress Notes (Signed)
Abstracted and sent to scan  

## 2017-10-27 NOTE — Telephone Encounter (Signed)
Copied from [place] [id number]. Topic: General - Other >> Oct 27, 2017  1:07 PM Oneta Rack wrote:  Relation to pt: self Call back [phone]   Reason for call:   Patient requesting one touch ultra strips please send to  [facility], [place] - [place], [place] - [address] [phone] (Phone) [phone] (Fax)

## 2017-10-28 ENCOUNTER — Telehealth: Payer: Self-pay | Admitting: Internal Medicine

## 2017-10-28 MED ORDER — GLUCOSE BLOOD VI STRP: ORAL_STRIP | 3 refills | Status: DC

## 2017-10-28 MED ORDER — GLUCOSE BLOOD VI STRP: ORAL_STRIP | 3 refills | Status: AC

## 2017-10-28 NOTE — Telephone Encounter (Signed)
Copied from [place] [id number]. Topic: Quick Communication - Rx Refill/Question >> Oct 28, 2017  9:11 AM Carolyn Stare wrote: Medication  glucose blood (ONE TOUCH ULTRA TEST) test strip  Has the patient contacted their pharmacy yes    Preferred [facility]   Agent: Please be advised that RX refills may take up to 3 business days. We ask that you follow-up with your pharmacy.

## 2017-10-28 NOTE — Telephone Encounter (Signed)
Contacted Felicia at [facility]; she confirms that refill request for glucose strips have been received, and I ready for pick up; will notify pt.

## 2017-10-28 NOTE — Addendum Note (Signed)
Addended by: Parke Poisson E on: 10/28/2017 12:56 PM   Modules accepted: Orders

## 2017-10-28 NOTE — Telephone Encounter (Signed)
Informed pt that his prescription is ready for pick up; he verbalizes understanding.

## 2017-11-03 ENCOUNTER — Telehealth: Payer: Self-pay | Admitting: Interventional Cardiology

## 2017-11-03 DIAGNOSIS — I5032 Chronic diastolic (congestive) heart failure: Secondary | ICD-10-CM

## 2017-11-03 NOTE — Telephone Encounter (Signed)
New message   Pt c/o medication issue:  1. Name of Medication: furosemide (LASIX) 40 MG tablet 2. How are you currently taking this medication (dosage and times per day)?   3. Are you having a reaction (difficulty breathing--STAT)? no  4. What is your medication issue? Patient requesting Lasix be increased    1) How much weight have you gained and in what time span? 3lbs overnight  2) If swelling, where is the swelling located? LEGS, ARMS  3) Are you currently taking a fluid pill? YES  4) Are you currently SOB? NO  5) Do you have a log of your daily weights (if so, list)? 220  6) Have you gained 3 pounds in a day or 5 pounds in a week?   7) Have you traveled recently? NO

## 2017-11-03 NOTE — Telephone Encounter (Signed)
Pt states he weighed 217lbs yesterday and today weighs 220lbs.  C/O swelling in ankles and wrists.  Gets SOB when exerting and says he is constantly coughing up white mucus.  Currently on Furosemide 40mg  BID.  Denies any diet changes.  Ate grits and scrambled eggs for breakfast yesterday and had homemade meatloaf (denies adding salt) and baked potato for lunch.  BP today was 140/60-65, HR 60.  Advised I will send message to Dr. Tamala Julian for review and advisement.

## 2017-11-04 NOTE — Telephone Encounter (Signed)
STart spironolactone. Stop KDUR. Continue current lasix. Check BMET 1 week

## 2017-11-05 ENCOUNTER — Encounter: Payer: Self-pay | Admitting: Adult Health

## 2017-11-05 ENCOUNTER — Ambulatory Visit (INDEPENDENT_AMBULATORY_CARE_PROVIDER_SITE_OTHER): Payer: Medicare Other | Admitting: Adult Health

## 2017-11-05 ENCOUNTER — Ambulatory Visit (INDEPENDENT_AMBULATORY_CARE_PROVIDER_SITE_OTHER)
Admission: RE | Admit: 2017-11-05 | Discharge: 2017-11-05 | Disposition: A | Discharge status: Home / Self Care | Payer: Medicare Other | Source: Ambulatory Visit | Attending: Adult Health | Admitting: Adult Health

## 2017-11-05 VITALS — BP 124/68 | HR 60 | Ht 66.0 in | Wt 220.2 lb

## 2017-11-05 DIAGNOSIS — J90 Pleural effusion, not elsewhere classified: Secondary | ICD-10-CM

## 2017-11-05 DIAGNOSIS — J449 Chronic obstructive pulmonary disease, unspecified: Secondary | ICD-10-CM | POA: Diagnosis not present

## 2017-11-05 DIAGNOSIS — J9611 Chronic respiratory failure with hypoxia: Secondary | ICD-10-CM | POA: Diagnosis not present

## 2017-11-05 DIAGNOSIS — Z951 Presence of aortocoronary bypass graft: Secondary | ICD-10-CM | POA: Diagnosis not present

## 2017-11-05 DIAGNOSIS — I6523 Occlusion and stenosis of bilateral carotid arteries: Secondary | ICD-10-CM | POA: Diagnosis not present

## 2017-11-05 MED ORDER — SPIRONOLACTONE 25 MG PO TABS: 12.5000 mg | ORAL_TABLET | Freq: Every day | ORAL | 3 refills | Status: DC

## 2017-11-05 NOTE — Patient Instructions (Addendum)
Robitussin-DM as needed for cough and congestion Continue on DuoNeb 3 times daily and as needed Continue on oxygen 2l/m .  Follow-up with Dr. Annamaria Boots in July with chest xray and As needed   Please contact office for sooner follow up if symptoms do not improve or worsen or seek emergency care

## 2017-11-05 NOTE — Telephone Encounter (Signed)
Clarified with Dr. Tamala Julian and he wants pt to start Spironolactone 12.5mg  QD, stop K+.  Spoke with pt and went over recommendations.  Pt will get BMET while here to see Dr. Caryl Comes next week.  Pt verbalized understanding and was in agreement with this plan.

## 2017-11-05 NOTE — Progress Notes (Signed)
_0  ID: Eric [name], male    DOB: 04-Jan-1927, 84 y.o.   MRN: [id number]  Chief Complaint  Patient presents with  . Follow-up    COPD     Referring provider: [name], *  HPI: 84 year old male former smoker veteran followed for Asthma /COPD , OSA and Hypoxic Respiratory failure  Past medical history significant for diabetes, coronary artery disease, cardiomyopathy with pacemaker, prostate cancer, permanent colostomy/diverticulitis   11/05/2017 Follow up : Asthma , PNA  Patient returns for a 2-week follow-up.  Patient was seen last visit with a slow to resolving asthmatic bronchitis.  He had been treated with a Z-Pak and amoxicillin.  Along with low-dose of steroids.  Chest x-ray showed a small left pleural effusion and some bibasilar atelectasis somewhat nodular in appearance especially on the right. Patient was recommended to finish antibiotic's.  Since last visit patient is feeling much better. Decreased cough and congestion . Today chest x-ray shows similar chronic scarring, decreased right basilar nodular density .       Allergies  Allergen Reactions  . Actos [Pioglitazone Hydrochloride] Other (See Comments)    "felt funny, drowsy, and weak":  [name] Buprenorphine Hcl Nausea And Vomiting  . Celebrex [Celecoxib] Other (See Comments)    "felt funny"  . Demerol Palpitations and Other (See Comments)    Increased BP  . Meperidine Palpitations    Other reaction(s): Other (See Comments) Increased BP  . Morphine And Related Nausea And Vomiting  . Ciprofloxacin Other (See Comments)    arthralgia  . Metformin Nausea And Vomiting  . Zocor [Simvastatin] Other (See Comments)    Makes pt very drowsy    Immunization History  Administered Date(s) Administered  . Influenza Split 06/09/2011, 03/01/2012, 04/08/2014  . Influenza, High Dose Seasonal PF 02/20/2016  . Influenza,inj,Quad PF,6+ Mos 02/02/2013, 01/23/2014, 03/12/2015  . Influenza,inj,quad, With  Preservative 02/06/2017  . Pneumococcal Conjugate-13 05/23/2014  . Pneumococcal Polysaccharide-23 02/27/2008  . Pneumococcal-Unspecified 06/10/2015  . Td 08/07/2008    Past Medical History:  Diagnosis Date  . ALLERGIC RHINITIS   . ANEMIA-NOS   . AORTIC SCLEROSIS   . Asthma   . CARDIOMYOPATHY, ISCHEMIC   . CAROTID BRUIT, RIGHT 02/27/2008  . Cataract    surgery  . CML (chronic myeloid leukemia) ([place]) 06/26/2015  . COPD   . CORONARY ARTERY DISEASE    a. s/p CABG in 1995 b. DES in 2008, 2009, and most recent in 2012 with DES to SVG-OM  . DIABETES MELLITUS-TYPE II    diet controlled  . Diastolic dysfunction, Grade 1 11/24/2014  . Diverticulitis of colon with perforation 11/22/2014  . Diverticulosis   . GERD   . HIATAL HERNIA   . Hx of echocardiogram    Echo (9/15):  Mild LVH, EF 50-55%, no RWMA, Gr 1 DD, MAC, mild LAE.  [name] HYPERLIPIDEMIA   . HYPERTENSION   . Hyponatremia 11/22/2014  . IBS (irritable bowel syndrome)   . LACTOSE INTOLERANCE   . OA (osteoarthritis)   . OBESITY   . On home oxygen therapy    "3L all the time" (10/12/2016)  . Partial small bowel obstruction ([place])   . PERIPHERAL VASCULAR DISEASE   . Primary hyperparathyroidism ([place])    Lab Results Component Value Date  PTH 150.7* 02/13/2013  CALCIUM 11.0* 02/13/2013  CAION 1.21 03/15/2008    . Prostate cancer ([place])    seed implants 2004  . SICK SINUS/ TACHY-BRADY SYNDROME 09/2007   s/p PPM st judes  .  Sleep apnea   . SMALL BOWEL OBSTRUCTION 04/18/2009   Qualifier: History of  By: Asa Lente MD, Jannifer Rodney Symptomatic diverticulosis 01/18/2009   Qualifier: Diagnosis of  By: Trellis Paganini PA-c, Amy S     Tobacco History: Social History   Tobacco Use  Smoking Status Former Smoker  . Packs/day: 1.00  . Years: 25.00  . Pack years: 25.00  . Types: Cigarettes  . Last attempt to quit: 06/08/1994  . Years since quitting: 23.4  Smokeless Tobacco Never Used   Counseling given: Not Answered   Outpatient Encounter  Medications as of 11/05/2017  Medication Sig  . acetaminophen (TYLENOL) 500 MG tablet Take 1,000 mg by mouth every 6 (six) hours as needed for mild pain or fever.  . ALPRAZolam (XANAX) 0.25 MG tablet Take 1 tablet (0.25 mg total) by mouth daily as needed for anxiety.  [name] amoxicillin (AMOXIL) 500 MG tablet Take 1 tablet (500 mg total) by mouth 2 (two) times daily.  . fluticasone (FLONASE) 50 MCG/ACT nasal spray Place 2 sprays into both nostrils daily. Allergies  . furosemide (LASIX) 40 MG tablet Take 1 tablet (40 mg total) by mouth 2 (two) times daily.  [name] glipiZIDE (GLUCOTROL) 5 MG tablet Take 2.5 mg 2 (two) times daily by mouth.  [name] glucose blood (ONE TOUCH ULTRA TEST) test strip USE TO CHECK BLOOD SUGARS ONCE A DAY  . guaiFENesin-dextromethorphan (ROBITUSSIN DM) 100-10 MG/5ML syrup Take 5 mLs by mouth every 4 (four) hours as needed for cough.  [name] HYDROcodone-acetaminophen (NORCO) 7.5-325 MG tablet Take 1 tablet every 6 (six) hours as needed by mouth for moderate pain or severe pain.  [name] ipratropium-albuterol (DUONEB) 0.5-2.5 (3) MG/3ML SOLN INHALE 1 vial (3 msl) via NEBULIZER 3 TIMES DAILY AND EVERY 4 HOURS AS NEEDED  . nitroGLYCERIN (NITROSTAT) 0.4 MG SL tablet Place 0.4 mg under the tongue every 5 (five) minutes as needed for chest pain. Reported on 12/12/2015  . NON FORMULARY Place 2 L into the nose continuous.  Vladimir Faster Glycol-Propyl Glycol (SYSTANE) 0.4-0.3 % SOLN Apply 2 drops to eye 3 (three) times daily as needed (dry eyes).   . pravastatin (PRAVACHOL) 20 MG tablet Take 60 mg by mouth daily.   . predniSONE (DELTASONE) 10 MG tablet Take 1 tablet (10 mg total) by mouth daily with breakfast.  . ranitidine (ZANTAC) 300 MG tablet Take 300 mg by mouth at bedtime.   . senna (SENOKOT) 8.6 MG tablet 2 tablets  . valsartan (DIOVAN) 160 MG tablet Take 160 mg by mouth daily.  . [DISCONTINUED] potassium chloride SA (K-DUR,KLOR-CON) 20 MEQ tablet Take 20 mEq by mouth daily.   No facility-administered  encounter medications on file as of 11/05/2017.      Review of Systems  Constitutional:   No  weight loss, night sweats,  Fevers, chills, fatigue, or  lassitude.  HEENT:   No headaches,  Difficulty swallowing,  Tooth/dental problems, or  Sore throat,                No sneezing, itching, ear ache, nasal congestion, post nasal drip,   CV:  No chest pain,  Orthopnea, PND, swelling in lower extremities, anasarca, dizziness, palpitations, syncope.   GI  No heartburn, indigestion, abdominal pain, nausea, vomiting, diarrhea, change in bowel habits, loss of appetite, bloody stools.   Resp:    No excess mucus, no productive cough,  No non-productive cough,  No coughing up of blood.  No change in color of mucus.  No  wheezing.  No chest wall deformity  Skin: no rash or lesions.  GU: no dysuria, change in color of urine, no urgency or frequency.  No flank pain, no hematuria   MS:  No joint pain or swelling.  No decreased range of motion.  No back pain.    Physical Exam  BP 124/68 (BP Location: Left Arm, Cuff Size: Normal)   Pulse 60   Ht _0  (1.676 m)   Wt 220 lb 3.2 oz (99.9 kg)   SpO2 94%   BMI 35.54 kg/m   GEN: A/Ox3; pleasant , NAD, eldlerly    HEENT:  [place]/AT,  EACs-clear, TMs-wnl, NOSE-clear, THROAT-clear, no lesions, no postnasal drip or exudate noted.   NECK:  Supple w/ fair ROM; no JVD; normal carotid impulses w/o bruits; no thyromegaly or nodules palpated; no lymphadenopathy.    RESP  Clear  P & A; w/o, wheezes/ rales/ or rhonchi. no accessory muscle use, no dullness to percussion  CARD:  RRR, no m/r/g, no peripheral edema, pulses intact, no cyanosis or clubbing.  GI:   Soft & nt; nml bowel sounds; no organomegaly or masses detected.   Musco: Warm bil, no deformities or joint swelling noted.   Neuro: alert, no focal deficits noted.    Skin: Warm, no lesions or rashes    Lab Results:    BNP  Imaging: Dg Chest 2 View  Result Date: 11/05/2017 CLINICAL DATA:   Follow-up abnormal chest radiography. No current complaints. EXAM: CHEST - 2 VIEW COMPARISON:  10/21/2017.  04/16/2017. FINDINGS: Previous median sternotomy and CABG. Dual lead pacemaker appears the same. Extensive chronic bilateral pleural and parenchymal scarring left worse than right as seen previously. Question of a nodular mass in the right lower lung on the frontal view on the previous study. On today's study, this density appears similar. Argue oblique, it could be very slightly smaller. No new or progressive finding. Chronic degenerative changes affect the spine. IMPRESSION: Previous CABG and pacemaker. Extensive chronic pleural and parenchymal scarring left worse than right. Redemonstration of a small nodular shadow at the right lung base. This is similar, or perhaps very minimally smaller. One additional follow-up study would be suggested in 3-4 weeks. Electronically Signed   By: Nelson Chimes M.D.   On: 11/05/2017 12:01   Dg Chest 2 View  Result Date: 10/21/2017 CLINICAL DATA:  Productive cough for 2 weeks, congestion, history smoking, emphysema, bypass surgery EXAM: CHEST - 2 VIEW COMPARISON:  04/16/2017 FINDINGS: LEFT subclavian transvenous pacemaker leads project at RIGHT atrium and RIGHT ventricle. Enlargement of cardiac silhouette post CABG. Atherosclerotic calcification aorta. Mediastinal contours and pulmonary vascularity normal. Changes of COPD and chronic bronchitis with persistent LEFT pleural effusion and atelectasis. Additional opacities in the lower lungs bilaterally could represent atelectasis or infiltrate. Area at the RIGHT base is somewhat more nodular in appearance than on the previous exam. Upper lungs clear. No pneumothorax. Bones unremarkable. IMPRESSION: Enlargement of cardiac silhouette post CABG and pacemaker. COPD changes with persistent LEFT pleural effusion and basilar atelectasis. Cannot exclude coexistent infiltrates at the bases, particularly on RIGHT which is somewhat  more nodular in appearance than previously seen; follow-up radiographs recommended until resolution to exclude RIGHT basilar pulmonary nodule. Electronically Signed   By: Lavonia Dana M.D.   On: 10/21/2017 10:42     Assessment & Plan:   COPD mixed type ([place]) Recent flare with possible left-sided pneumonia.  Patient is clinically improved. Chest x-ray shows slight decrease.  Will need chest x-ray on  return for clearance  Plan  Patient Instructions  Robitussin-DM as needed for cough and congestion Continue on DuoNeb 3 times daily and as needed Continue on oxygen 2l/m .  Follow-up with Dr. Annamaria Boots in July with chest xray and As needed   Please contact office for sooner follow up if symptoms do not improve or worsen or seek emergency care        Chronic respiratory failure with hypoxia ([place]) Cont on O2 .      Rexene Edison, NP 11/05/2017

## 2017-11-05 NOTE — Telephone Encounter (Signed)
Left message to call back  

## 2017-11-05 NOTE — Assessment & Plan Note (Signed)
Cont on O2 .  

## 2017-11-05 NOTE — Assessment & Plan Note (Signed)
Recent flare with possible left-sided pneumonia.  Patient is clinically improved. Chest x-ray shows slight decrease.  Will need chest x-ray on return for clearance  Plan  Patient Instructions  Robitussin-DM as needed for cough and congestion Continue on DuoNeb 3 times daily and as needed Continue on oxygen 2l/m .  Follow-up with Dr. Annamaria Boots in July with chest xray and As needed   Please contact office for sooner follow up if symptoms do not improve or worsen or seek emergency care

## 2017-11-09 ENCOUNTER — Telehealth: Payer: Self-pay | Admitting: Adult Health

## 2017-11-09 DIAGNOSIS — J449 Chronic obstructive pulmonary disease, unspecified: Secondary | ICD-10-CM

## 2017-11-09 NOTE — Telephone Encounter (Signed)
[facility] for [place] @ [facility]

## 2017-11-09 NOTE — Telephone Encounter (Signed)
Order fixed and sent to [facility].

## 2017-11-09 NOTE — Telephone Encounter (Signed)
Spoke to [place] needs a true oxygen order on the template with night and day and stating simply go mini

## 2017-11-12 ENCOUNTER — Ambulatory Visit (INDEPENDENT_AMBULATORY_CARE_PROVIDER_SITE_OTHER): Payer: Medicare Other | Admitting: Internal Medicine

## 2017-11-12 ENCOUNTER — Other Ambulatory Visit: Payer: Medicare Other | Admitting: *Deleted

## 2017-11-12 ENCOUNTER — Encounter: Payer: Self-pay | Admitting: Internal Medicine

## 2017-11-12 VITALS — BP 122/58 | HR 80 | Ht 66.0 in | Wt 224.0 lb

## 2017-11-12 DIAGNOSIS — Z950 Presence of cardiac pacemaker: Secondary | ICD-10-CM | POA: Diagnosis not present

## 2017-11-12 DIAGNOSIS — J209 Acute bronchitis, unspecified: Secondary | ICD-10-CM | POA: Diagnosis not present

## 2017-11-12 DIAGNOSIS — J219 Acute bronchiolitis, unspecified: Secondary | ICD-10-CM | POA: Diagnosis not present

## 2017-11-12 DIAGNOSIS — I6523 Occlusion and stenosis of bilateral carotid arteries: Secondary | ICD-10-CM

## 2017-11-12 DIAGNOSIS — I5032 Chronic diastolic (congestive) heart failure: Secondary | ICD-10-CM | POA: Diagnosis not present

## 2017-11-12 DIAGNOSIS — I495 Sick sinus syndrome: Secondary | ICD-10-CM | POA: Diagnosis not present

## 2017-11-12 LAB — CUP PACEART INCLINIC DEVICE CHECK
Battery Impedance: 2300 Ohm
Battery Voltage: 2.78 V
Brady Statistic RV Percent Paced: 1 % — CL
Implantable Lead Implant Date: 20090414
Implantable Lead Implant Date: 20090414
Implantable Lead Location: 753860
Implantable Pulse Generator Implant Date: 20090414
Lead Channel Impedance Value: 359 Ohm
Lead Channel Impedance Value: 527 Ohm
Lead Channel Pacing Threshold Pulse Width: 0.4 ms
Lead Channel Sensing Intrinsic Amplitude: 3.4 mV
Lead Channel Sensing Intrinsic Amplitude: 9.7 mV
Lead Channel Setting Pacing Pulse Width: 0.4 ms
MDC IDC LEAD LOCATION: 753859
MDC IDC MSMT LEADCHNL RA PACING THRESHOLD AMPLITUDE: 0.75 V
MDC IDC MSMT LEADCHNL RV PACING THRESHOLD AMPLITUDE: 1.375 V
MDC IDC MSMT LEADCHNL RV PACING THRESHOLD PULSEWIDTH: 0.4 ms
MDC IDC PG SERIAL: 2094876
MDC IDC SESS DTM: 20190607170916
MDC IDC SET LEADCHNL RA PACING AMPLITUDE: 2 V
MDC IDC SET LEADCHNL RV SENSING SENSITIVITY: 2 mV
MDC IDC STAT BRADY RA PERCENT PACED: 28 %

## 2017-11-12 MED ORDER — AMOXICILLIN-POT CLAVULANATE 875-125 MG PO TABS: 1.0000 | ORAL_TABLET | Freq: Two times a day (BID) | ORAL | 0 refills | Status: DC

## 2017-11-12 NOTE — Progress Notes (Signed)
Electrophysiology Office Note   Date:  11/12/2017   ID:  Eric [name], DOB June 11, 1926, MRN [id number]  PCP:  [name], MD  Cardiologist: [facility] Primary Electrophysiologist: [name], MD    Chief Complaint  Patient presents with  . Pacemaker Check     History of Present Illness: Eric [name] is a 84 y.o. male is seen today in followup for pacemaker implanted 2009 for sinus node dysfunction.  Hx of CAD with CABG Cath 2009 at which time vein graft to diagonal was stented. In 2012 he underwent stenting of his vein graft to the OM. LVEF 55% echo 9/15   He has significant problems with shortness of breath. Oxygen desaturation occurs routinely.  Functional status is relatively stable.  He denies chest pain.  He has a little bit of peripheral edema.  He had pneumonia earlier in May.  He was seen in follow-up at the end of May and seem to be doing better.  Over the last few days he has had a recurrent problem with productive sputum with yellow discoloration and worsening shortness of breath.  Past Medical History:  Diagnosis Date  . ALLERGIC RHINITIS   . ANEMIA-NOS   . AORTIC SCLEROSIS   . Asthma   . CARDIOMYOPATHY, ISCHEMIC   . CAROTID BRUIT, RIGHT 02/27/2008  . Cataract    surgery  . CML (chronic myeloid leukemia) ([place]) 06/26/2015  . COPD   . CORONARY ARTERY DISEASE    a. s/p CABG in 1995 b. DES in 2008, 2009, and most recent in 2012 with DES to SVG-OM  . DIABETES MELLITUS-TYPE II    diet controlled  . Diastolic dysfunction, Grade 1 11/24/2014  . Diverticulitis of colon with perforation 11/22/2014  . Diverticulosis   . GERD   . HIATAL HERNIA   . Hx of echocardiogram    Echo (9/15):  Mild LVH, EF 50-55%, no RWMA, Gr 1 DD, MAC, mild LAE.  [name] HYPERLIPIDEMIA   . HYPERTENSION   . Hyponatremia 11/22/2014  . IBS (irritable bowel syndrome)   . LACTOSE INTOLERANCE   . OA (osteoarthritis)   . OBESITY   . On home oxygen therapy    "3L all the time"  (10/12/2016)  . Partial small bowel obstruction ([place])   . PERIPHERAL VASCULAR DISEASE   . Primary hyperparathyroidism ([place])    Lab Results Component Value Date  PTH 150.7* 02/13/2013  CALCIUM 11.0* 02/13/2013  CAION 1.21 03/15/2008    . Prostate cancer ([place])    seed implants 2004  . SICK SINUS/ TACHY-BRADY SYNDROME 09/2007   s/p PPM st judes  . Sleep apnea   . SMALL BOWEL OBSTRUCTION 04/18/2009   Qualifier: History of  By: Asa Lente MD, Jannifer Rodney Symptomatic diverticulosis 01/18/2009   Qualifier: Diagnosis of  By: Shane Crutch, Amy S    Past Surgical History:  Procedure Laterality Date  . BACK SURGERY    . Bilateral cataracts    . COLON RESECTION N/A 11/28/2014   Procedure: EXPLORATORY LAPAROTOMY, SIGMOID COLECTOMY WITH COLOSTOMY;  Surgeon: Jackolyn Confer, MD;  Location: [facility]S;  Service: General;  Laterality: N/A;  . COLON SURGERY    . COLONOSCOPY    . CORONARY ARTERY BYPASS GRAFT    . ESOPHAGOGASTRODUODENOSCOPY  multiple  . FLEXIBLE SIGMOIDOSCOPY N/A 09/22/2013   Procedure: FLEXIBLE SIGMOIDOSCOPY;  Surgeon: Gatha Mayer, MD;  Location: [facility] ENDOSCOPY;  Service: Endoscopy;  Laterality: N/A;  . INGUINAL HERNIA REPAIR Bilateral   .  [place] SURGERY  12/2008  . PACEMAKER INSERTION     DDD/[employer]         Last interrogation 2/13  on chart     Pacemaker guideline order Dr Tamala Julian on chart  . Partial small bowel obstruction  2009  . PENILE PROSTHESIS PLACEMENT    . PTCA  2008, 2009, 2012   with DES  . REMOVAL OF PENILE PROSTHESIS N/A 04/22/2017   Procedure: EXPLANT OF MULTICOMPONENT PENILE PROSTHESIS;  Surgeon: Irine Seal, MD;  Location: [facility];  Service: Urology;  Laterality: N/A;  . TOTAL HIP ARTHROPLASTY  08/21/2011   Procedure: TOTAL HIP ARTHROPLASTY;  Surgeon: Johnn Hai, MD;  Location: [facility];  Service: Orthopedics;  Laterality: Right;     Current Outpatient Medications  Medication Sig Dispense Refill  . acetaminophen (TYLENOL) 500 MG tablet Take 1,000 mg by mouth  every 6 (six) hours as needed for mild pain or fever.    . ALPRAZolam (XANAX) 0.25 MG tablet Take 1 tablet (0.25 mg total) by mouth daily as needed for anxiety. 30 tablet 1  . amoxicillin (AMOXIL) 500 MG tablet Take 1 tablet (500 mg total) by mouth 2 (two) times daily. 14 tablet 0  . fluticasone (FLONASE) 50 MCG/ACT nasal spray Place 2 sprays into both nostrils daily. Allergies 16 g 1  . furosemide (LASIX) 40 MG tablet Take 1 tablet (40 mg total) by mouth 2 (two) times daily. 60 tablet 6  . glipiZIDE (GLUCOTROL) 5 MG tablet Take 2.5 mg 2 (two) times daily by mouth.    [name] glucose blood (ONE TOUCH ULTRA TEST) test strip USE TO CHECK BLOOD SUGARS ONCE A DAY 100 each 3  . guaiFENesin-dextromethorphan (ROBITUSSIN DM) 100-10 MG/5ML syrup Take 5 mLs by mouth every 4 (four) hours as needed for cough. 118 mL 0  . HYDROcodone-acetaminophen (NORCO) 7.5-325 MG tablet Take 1 tablet every 6 (six) hours as needed by mouth for moderate pain or severe pain. 20 tablet 0  . ipratropium-albuterol (DUONEB) 0.5-2.5 (3) MG/3ML SOLN INHALE 1 vial (3 msl) via NEBULIZER 3 TIMES DAILY AND EVERY 4 HOURS AS NEEDED  3  . nitroGLYCERIN (NITROSTAT) 0.4 MG SL tablet Place 0.4 mg under the tongue every 5 (five) minutes as needed for chest pain. Reported on 12/12/2015    . NON FORMULARY Place 2 L into the nose continuous.    Vladimir Faster Glycol-Propyl Glycol (SYSTANE) 0.4-0.3 % SOLN Apply 2 drops to eye 3 (three) times daily as needed (dry eyes).     . pravastatin (PRAVACHOL) 20 MG tablet Take 60 mg by mouth daily.     . predniSONE (DELTASONE) 10 MG tablet Take 1 tablet (10 mg total) by mouth daily with breakfast. 5 tablet 0  . ranitidine (ZANTAC) 300 MG tablet Take 300 mg by mouth at bedtime.     . senna (SENOKOT) 8.6 MG tablet 2 tablets    . spironolactone (ALDACTONE) 25 MG tablet Take 0.5 tablets (12.5 mg total) by mouth daily. 45 tablet 3  . valsartan (DIOVAN) 160 MG tablet Take 160 mg by mouth daily.     No current  facility-administered medications for this visit.     Allergies:   Actos [pioglitazone hydrochloride]; Buprenorphine hcl; Celebrex [celecoxib]; Demerol; Meperidine; Morphine and related; Ciprofloxacin; Metformin; and Zocor [simvastatin]   Social History:  The patient  reports that he quit smoking about 23 years ago. His smoking use included cigarettes. He has a 25.00 pack-year smoking history. He has never used smokeless  tobacco. He reports that he does not drink alcohol or use drugs.   Family History:  The patient's    family history includes Cancer in his mother; Heart attack in his brother, unknown relative, and unknown relative; Hypertension in his mother.    ROS:  Please see the history of present illness and past medical history  Otherwise, review of systems is negative      PHYSICAL EXAM: VS:  BP (!) 122/58   Pulse 80   Ht _0  (1.676 m)   Wt 224 lb (101.6 kg)   SpO2 97%   BMI 36.15 kg/m  , BMI Body mass index is 36.15 kg/m. Well developed wearing O2 without much distress HENT normal Neck supple with JVP-flat Wheezes  Regular rate and rhythm, no murmurs or gallops Abd-soft with active BS No Clubbing cyanosis tr edema Skin-warm and dry A & Oriented  Grossly normal sensory and motor function   EKG: sinus @ 80 21/09/38 rsR'   Recent Labs: 03/12/2017: ALT 9 10/19/2017: BUN 26; Creatinine, Ser 1.07; Hemoglobin 9.0; NT-Pro BNP 473; Platelets 155; Potassium 4.9; Sodium 140    Lipid Panel     Component Value Date/Time   CHOL 108 05/27/2015 1512   TRIG 151.0 (H) 05/27/2015 1512   TRIG 70 01/06/2010   HDL 22.70 (L) 05/27/2015 1512   CHOLHDL 5 05/27/2015 1512   VLDL 30.2 05/27/2015 1512   LDLCALC 55 05/27/2015 1512     Wt Readings from Last 3 Encounters:  11/12/17 224 lb (101.6 kg)  11/05/17 220 lb 3.2 oz (99.9 kg)  10/25/17 221 lb (100.2 kg)      Other studies Reviewed: Additional studies/ records that were reviewed today include: none   Review of the above  records today demonstrates: As above    ASSESSMENT AND PLAN: Sinus Node Dysfunction  Pacemaker St Jude The patient's device was interrogated.  The information was reviewed. No changes were made in the programming.     CAD  Hypertension  CHF chronic diastolic   O2 Dependent COPD  Exertional Tachycardia   Bronchitis   Pacemaker function is stable.   Without symptoms of ischemia  Euvolemic continue current meds-- little edema prob expected in context of O2 dept COPD  Spoke with Dr. Annamaria Boots.  Will prescribe Augmentin 875 twice daily  His daughter died 3 weeks ago     Labs/ tests ordered today include:    Orders Placed This Encounter  Procedures  . EKG 12-Lead     Disposition:   FU with me   1 year(s)  Signed, [name], MD  11/12/2017 5:01 PM     [place] [address] [phone] (office) [phone] (fax)

## 2017-11-12 NOTE — Patient Instructions (Signed)
Medication Instructions:  Your physician has recommended you make the following change in your medication:   Begin Augmentin 875/150, one tablet, two times per day for 7 days.  Labwork: None ordered.  Testing/Procedures: None ordered.  Follow-Up: Your physician wants you to follow-up in: One Year with Dr Caryl Comes. You will receive a reminder letter in the mail two months in advance. If you don't receive a letter, please call our office to schedule the follow-up appointment.  6 month follow up with the device clinic   Any Other Special Instructions Will Be Listed Below (If Applicable).     If you need a refill on your cardiac medications before your next appointment, please call your pharmacy.

## 2017-11-13 LAB — BASIC METABOLIC PANEL
BUN/Creatinine Ratio: 29 — ABNORMAL HIGH (ref 10–24)
BUN: 30 mg/dL — ABNORMAL HIGH (ref 8–27)
CO2: 25 mmol/L (ref 20–29)
Calcium: 10.1 mg/dL (ref 8.6–10.2)
Chloride: 99 mmol/L (ref 96–106)
Creatinine, Ser: 1.04 mg/dL (ref 0.76–1.27)
GFR calc Af Amer: 74 mL/min/{1.73_m2} (ref >59)
GFR calc non Af Amer: 64 mL/min/{1.73_m2} (ref >59)
Glucose: 103 mg/dL — ABNORMAL HIGH (ref 65–99)
Potassium: 4.1 mmol/L (ref 3.5–5.2)
Sodium: 139 mmol/L (ref 134–144)

## 2017-11-16 ENCOUNTER — Telehealth: Payer: Self-pay | Admitting: Internal Medicine

## 2017-11-16 NOTE — Telephone Encounter (Signed)
Patient informed that he does not need to follow up with Dr. Sharlet Salina from his visit with Dr. Caryl Comes. Patients labs from that office were normal.

## 2017-11-16 NOTE — Telephone Encounter (Signed)
Copied from [place] [id number]. Topic: Inquiry >> Nov 16, 2017  8:32 AM Margot Ables wrote: Reason for CRM: Eric [name] called stating he rececived call from Dr. Aquilla Hacker office but he thinks they wanted him to see Dr. Sharlet Salina. Labs were copied to Dr. Sharlet Salina. Eric [name] requesting call back to advise if appt is needed with PCP.

## 2017-11-16 NOTE — Telephone Encounter (Signed)
New Message    Patient is returning call in reference to labs. He advises that he did not understand the message that was left. Please call.

## 2017-11-16 NOTE — Telephone Encounter (Signed)
Called pt back to clarify lab values; pt understands they are normal and he had no additional questions.

## 2017-11-23 ENCOUNTER — Encounter: Payer: Self-pay | Admitting: Pulmonary Disease

## 2017-11-23 ENCOUNTER — Other Ambulatory Visit: Payer: Self-pay | Admitting: Pulmonary Disease

## 2017-11-23 ENCOUNTER — Ambulatory Visit (INDEPENDENT_AMBULATORY_CARE_PROVIDER_SITE_OTHER): Payer: Medicare Other | Admitting: Pulmonary Disease

## 2017-11-23 ENCOUNTER — Ambulatory Visit (INDEPENDENT_AMBULATORY_CARE_PROVIDER_SITE_OTHER)
Admission: RE | Admit: 2017-11-23 | Discharge: 2017-11-23 | Disposition: A | Discharge status: Home / Self Care | Payer: Medicare Other | Source: Ambulatory Visit | Attending: Pulmonary Disease | Admitting: Pulmonary Disease

## 2017-11-23 VITALS — BP 142/68 | HR 71 | Ht 66.0 in | Wt 225.8 lb

## 2017-11-23 DIAGNOSIS — J9611 Chronic respiratory failure with hypoxia: Secondary | ICD-10-CM | POA: Diagnosis not present

## 2017-11-23 DIAGNOSIS — J90 Pleural effusion, not elsewhere classified: Secondary | ICD-10-CM | POA: Diagnosis not present

## 2017-11-23 DIAGNOSIS — G4733 Obstructive sleep apnea (adult) (pediatric): Secondary | ICD-10-CM | POA: Diagnosis not present

## 2017-11-23 DIAGNOSIS — J449 Chronic obstructive pulmonary disease, unspecified: Secondary | ICD-10-CM

## 2017-11-23 DIAGNOSIS — R05 Cough: Secondary | ICD-10-CM | POA: Diagnosis not present

## 2017-11-23 MED ORDER — BUDESONIDE 0.25 MG/2ML IN SUSP: 0.2500 mg | Freq: Two times a day (BID) | RESPIRATORY_TRACT | 12 refills | Status: DC

## 2017-11-23 MED ORDER — PREDNISONE 10 MG PO TABS: ORAL_TABLET | ORAL | 0 refills | Status: DC

## 2017-11-23 MED ORDER — DOXYCYCLINE HYCLATE 100 MG PO TABS
100.0000 mg | ORAL_TABLET | Freq: Two times a day (BID) | ORAL | 0 refills | Status: DC
Start: 2017-11-23 — End: 2018-01-05

## 2017-11-23 NOTE — Progress Notes (Signed)
Discussed results with patient at office visit today.  Nothing further is needed at this time.  Wyn Quaker FNP

## 2017-11-23 NOTE — Assessment & Plan Note (Signed)
Continue oxygen therapy.

## 2017-11-23 NOTE — Progress Notes (Signed)
_0  ID: Eric [name], male    DOB: 1927-05-13, 84 y.o.   MRN: [id number]  Chief Complaint  Patient presents with  . Acute Visit    Cough spells are keeping him up at night, states he is having to get up 2x/night for nebulizers. Thick green and yellow mucous, finished augmentin last week.     Referring provider: [name], *  HPI: 84 year old male former smoker veteran followed for Asthma /COPD , OSA and Hypoxic Respiratory failure  Past medical history significant for diabetes, coronary artery disease, cardiomyopathy with pacemaker, prostate cancer, permanent colostomy/diverticulitis   Recent [facility] Encounters:   11/05/2017 Follow up : Asthma , PNA  Patient returns for a 2-week follow-up.  Patient was seen last visit with a slow to resolving asthmatic bronchitis.  He had been treated with a Z-Pak and amoxicillin.  Along with low-dose of steroids.  Chest x-ray showed a small left pleural effusion and some bibasilar atelectasis somewhat nodular in appearance especially on the right. Patient was recommended to finish antibiotic's.  Since last visit patient is feeling much better. Decreased cough and congestion . Today chest x-ray shows similar chronic scarring, decreased right basilar nodular density .  Plan: cough management, repeat CXRY on return, follow up with Dr. Annamaria Boots   Tests:  11/05/2017-chest x-ray- extensive chronic pleural and parenchymal scarring left worse than right, redemonstration of a small nodular shadow at the right lung base, repeat chest x-ray in 3 to 4 weeks 06/03/2016-CT NGO with contrast- mildly limited by respiratory motion, lower lobe bronchial wall thickening, suspected combination of bronchitis and/or bronchopneumonia 05/19/2016-echocardiogram-LV ejection fraction 55 to 26%, grade 1 diastolic dysfunction, mild LVH  04/23/2011-pulmonary function test  Imaging:   Cardiac:   Labs:   Micro:   Chart Review:      11/23/17 Acute    Pleasant 84 year old patient seen in office today.  Patient reporting that he is had multiple rounds of antibiotics.  Just recently finished Augmentin from his cardiologist.  Patient reporting still having chest pressure, thick yellow mucus, and coughing more especially when he is laying flat.  Patient reporting adherence to his duo nebs 3 times a day, allergy medications, cough medicine.  Patient reports adherence to CPAP.  Patient reporting seeing Dr. Annamaria Boots in July.  Allergies  Allergen Reactions  . Actos [Pioglitazone Hydrochloride] Other (See Comments)    "felt funny, drowsy, and weak":  [name] Buprenorphine Hcl Nausea And Vomiting  . Celebrex [Celecoxib] Other (See Comments)    "felt funny"  . Demerol Palpitations and Other (See Comments)    Increased BP  . Meperidine Palpitations    Other reaction(s): Other (See Comments) Increased BP  . Morphine And Related Nausea And Vomiting  . Ciprofloxacin Other (See Comments)    arthralgia  . Metformin Nausea And Vomiting  . Zocor [Simvastatin] Other (See Comments)    Makes pt very drowsy    Immunization History  Administered Date(s) Administered  . Influenza Split 06/09/2011, 03/01/2012, 04/08/2014  . Influenza, High Dose Seasonal PF 02/20/2016  . Influenza,inj,Quad PF,6+ Mos 02/02/2013, 01/23/2014, 03/12/2015  . Influenza,inj,quad, With Preservative 02/06/2017  . Pneumococcal Conjugate-13 05/23/2014  . Pneumococcal Polysaccharide-23 02/27/2008  . Pneumococcal-Unspecified 06/10/2015  . Td 08/07/2008    Past Medical History:  Diagnosis Date  . ALLERGIC RHINITIS   . ANEMIA-NOS   . AORTIC SCLEROSIS   . Asthma   . CARDIOMYOPATHY, ISCHEMIC   . CAROTID BRUIT, RIGHT 02/27/2008  . Cataract    surgery  .  CML (chronic myeloid leukemia) ([place]) 06/26/2015  . COPD   . CORONARY ARTERY DISEASE    a. s/p CABG in 1995 b. DES in 2008, 2009, and most recent in 2012 with DES to SVG-OM  . DIABETES MELLITUS-TYPE II     diet controlled  . Diastolic dysfunction, Grade 1 11/24/2014  . Diverticulitis of colon with perforation 11/22/2014  . Diverticulosis   . GERD   . HIATAL HERNIA   . Hx of echocardiogram    Echo (9/15):  Mild LVH, EF 50-55%, no RWMA, Gr 1 DD, MAC, mild LAE.  [name] HYPERLIPIDEMIA   . HYPERTENSION   . Hyponatremia 11/22/2014  . IBS (irritable bowel syndrome)   . LACTOSE INTOLERANCE   . OA (osteoarthritis)   . OBESITY   . On home oxygen therapy    "3L all the time" (10/12/2016)  . Partial small bowel obstruction ([place])   . PERIPHERAL VASCULAR DISEASE   . Primary hyperparathyroidism ([place])    Lab Results Component Value Date  PTH 150.7* 02/13/2013  CALCIUM 11.0* 02/13/2013  CAION 1.21 03/15/2008    . Prostate cancer ([place])    seed implants 2004  . SICK SINUS/ TACHY-BRADY SYNDROME 09/2007   s/p PPM st judes  . Sleep apnea   . SMALL BOWEL OBSTRUCTION 04/18/2009   Qualifier: History of  By: Asa Lente MD, Jannifer Rodney Symptomatic diverticulosis 01/18/2009   Qualifier: Diagnosis of  By: Trellis Paganini PA-c, Amy S     Tobacco History: Social History   Tobacco Use  Smoking Status Former Smoker  . Packs/day: 1.00  . Years: 25.00  . Pack years: 25.00  . Types: Cigarettes  . Last attempt to quit: 06/08/1994  . Years since quitting: 23.4  Smokeless Tobacco Never Used   Counseling given: Not Answered Continue not smoking  Outpatient Encounter Medications as of 11/23/2017  Medication Sig  . acetaminophen (TYLENOL) 500 MG tablet Take 1,000 mg by mouth every 6 (six) hours as needed for mild pain or fever.  . ALPRAZolam (XANAX) 0.25 MG tablet Take 1 tablet (0.25 mg total) by mouth daily as needed for anxiety.  [name] amoxicillin (AMOXIL) 500 MG tablet Take 1 tablet (500 mg total) by mouth 2 (two) times daily.  . fluticasone (FLONASE) 50 MCG/ACT nasal spray Place 2 sprays into both nostrils daily. Allergies  . furosemide (LASIX) 40 MG tablet Take 1 tablet (40 mg total) by mouth 2 (two) times daily.  [name] glipiZIDE  (GLUCOTROL) 5 MG tablet Take 2.5 mg 2 (two) times daily by mouth.  [name] glucose blood (ONE TOUCH ULTRA TEST) test strip USE TO CHECK BLOOD SUGARS ONCE A DAY  . guaiFENesin-dextromethorphan (ROBITUSSIN DM) 100-10 MG/5ML syrup Take 5 mLs by mouth every 4 (four) hours as needed for cough.  [name] HYDROcodone-acetaminophen (NORCO) 7.5-325 MG tablet Take 1 tablet every 6 (six) hours as needed by mouth for moderate pain or severe pain.  [name] ipratropium-albuterol (DUONEB) 0.5-2.5 (3) MG/3ML SOLN INHALE 1 vial (3 msl) via NEBULIZER 3 TIMES DAILY AND EVERY 4 HOURS AS NEEDED  . nitroGLYCERIN (NITROSTAT) 0.4 MG SL tablet Place 0.4 mg under the tongue every 5 (five) minutes as needed for chest pain. Reported on 12/12/2015  . NON FORMULARY Place 2 L into the nose continuous.  Vladimir Faster Glycol-Propyl Glycol (SYSTANE) 0.4-0.3 % SOLN Apply 2 drops to eye 3 (three) times daily as needed (dry eyes).   . pravastatin (PRAVACHOL) 20 MG tablet Take 60 mg by mouth daily.   . predniSONE (DELTASONE)  10 MG tablet Take 1 tablet (10 mg total) by mouth daily with breakfast.  . ranitidine (ZANTAC) 300 MG tablet Take 300 mg by mouth at bedtime.   . senna (SENOKOT) 8.6 MG tablet 2 tablets  . spironolactone (ALDACTONE) 25 MG tablet Take 0.5 tablets (12.5 mg total) by mouth daily.  . valsartan (DIOVAN) 160 MG tablet Take 160 mg by mouth daily.  [name] amoxicillin-clavulanate (AUGMENTIN) 875-125 MG tablet Take 1 tablet by mouth 2 (two) times daily. (Patient not taking: Reported on 11/23/2017)  . budesonide (PULMICORT) 0.25 MG/2ML nebulizer solution Take 2 mLs (0.25 mg total) by nebulization 2 (two) times daily.  [name] doxycycline (VIBRA-TABS) 100 MG tablet Take 1 tablet (100 mg total) by mouth 2 (two) times daily.  . predniSONE (DELTASONE) 10 MG tablet 4 tabs for 2 days, then 3 tabs for 2 days, 2 tabs for 2 days, then 1 tab for 2 days, then stop   No facility-administered encounter medications on file as of 11/23/2017.      Review of  Systems  Constitutional:  +fatigue   No  weight loss, night sweats,  fevers, chills HEENT:   No headaches,  Difficulty swallowing,  Tooth/dental problems, or  Sore throat, No sneezing, itching, ear ache, nasal congestion, post nasal drip  CV: No chest pain,  orthopnea, PND, swelling in lower extremities, anasarca, dizziness, palpitations, syncope  GI: No heartburn, indigestion, abdominal pain, nausea, vomiting, diarrhea, change in bowel habits, loss of appetite, bloody stools Resp: +productive cough with mucous, sob with exertion, wheezing   No coughing up of blood.    No chest wall deformity Skin: no rash, lesions, no skin changes. GU: no dysuria, change in color of urine, no urgency or frequency.  No flank pain, no hematuria  MS:  No joint pain or swelling.  No decreased range of motion.  No back pain. Psych:  No change in mood or affect. No depression or anxiety.  No memory loss.   Physical Exam  BP (!) 142/68   Pulse 71   Ht _0  (1.676 m)   Wt 225 lb 12.8 oz (102.4 kg)   SpO2 96%   BMI 36.45 kg/m   Wt Readings from Last 3 Encounters:  11/23/17 225 lb 12.8 oz (102.4 kg)  11/12/17 224 lb (101.6 kg)  11/05/17 220 lb 3.2 oz (99.9 kg)    GEN: A/Ox3; pleasant , NAD, well nourished    HEENT:  [place]/AT,  EACs-clear, TMs-wnl, NOSE-clear, THROAT-clear, no lesions, no postnasal drip or exudate noted.   NECK:  Supple w/ fair ROM; no JVD; normal carotid impulses w/o bruits; no thyromegaly or nodules palpated; right superficial cervical tenderness   RESP:  +rhonchi in bases, inspiratory and expiratory wheezes  no accessory muscle use, no dullness to percussion  CARD:  RRR, no m/r/g, no peripheral edema, pulses intact, no cyanosis or clubbing.  GI:   Soft & nt; nml bowel sounds; no organomegaly or masses detected.   Musco: Warm bil, no deformities or joint swelling noted.   Neuro: alert, no focal deficits noted.    Skin: Warm, no lesions or rashes    Lab Results:  CBC     Component Value Date/Time   WBC 5.4 10/19/2017 1448   WBC 6.8 04/16/2017 1431   RBC 2.93 (L) 10/19/2017 1448   RBC 2.86 (L) 04/16/2017 1431   HGB 9.0 (L) 10/19/2017 1448   HGB 9.2 (L) 08/04/2016 1025   HCT 28.2 (L) 10/19/2017 1448   HCT 28.1 (L)  08/04/2016 1025   PLT 155 10/19/2017 1448   MCV 96 10/19/2017 1448   MCV 95.3 08/04/2016 1025   MCH 30.7 10/19/2017 1448   MCH 29.7 04/16/2017 1431   MCHC 31.9 10/19/2017 1448   MCHC 31.3 04/16/2017 1431   RDW 15.2 10/19/2017 1448   RDW 16.5 (H) 08/04/2016 1025   LYMPHSABS 1.0 08/04/2016 1025   MONOABS 0.9 08/04/2016 1025   EOSABS 0.2 08/04/2016 1025   BASOSABS 0.0 08/04/2016 1025    BMET    Component Value Date/Time   NA 139 11/12/2017 1551   NA 136 08/04/2016 1025   K 4.1 11/12/2017 1551   K 4.1 08/04/2016 1025   CL 99 11/12/2017 1551   CO2 25 11/12/2017 1551   CO2 24 08/04/2016 1025   GLUCOSE 103 (H) 11/12/2017 1551   GLUCOSE 135 (H) 04/23/2017 0425   GLUCOSE 150 (H) 08/04/2016 1025   BUN 30 (H) 11/12/2017 1551   BUN 18.0 08/04/2016 1025   CREATININE 1.04 11/12/2017 1551   CREATININE 1.0 08/04/2016 1025   CALCIUM 10.1 11/12/2017 1551   CALCIUM 9.7 08/04/2016 1025   GFRNONAA 64 11/12/2017 1551   GFRAA 74 11/12/2017 1551    BNP    Component Value Date/Time   BNP 496.0 (H) 10/12/2016 0904    ProBNP    Component Value Date/Time   PROBNP 473 10/19/2017 1448   PROBNP 296.0 (H) 11/27/2015 1621    Imaging: Dg Chest 2 View  Result Date: 11/23/2017 CLINICAL DATA:  Persistent productive cough. Follow-up study. History of COPD, former smoker, CABG. EXAM: CHEST - 2 VIEW COMPARISON:  Chest x-ray of Nov 05, 2017 and Oct 21, 2017. FINDINGS: The right lung is mildly hyperinflated. The interstitial markings remain increased at the right base. Subtle nodular density at the right base is less conspicuous today. On the left there is mild stable volume loss with pleural thickening or pleural fluid inferior laterally. There is  stable increased interstitial density at the left base. The heart is top-normal in size. There are post CABG changes. The ICD is in stable position. There is calcification in the wall of the aortic arch. There is multilevel degenerative disc disease of the thoracic spine. The sternal wires are intact. IMPRESSION: Persistent increased density at both bases greatest on the left little changed from the previous study. No overt CHF. Density noted at the right lung base on the previous study is less conspicuous today. Electronically Signed   By: David  Martinique M.D.   On: 11/23/2017 14:03   Dg Chest 2 View  Result Date: 11/05/2017 CLINICAL DATA:  Follow-up abnormal chest radiography. No current complaints. EXAM: CHEST - 2 VIEW COMPARISON:  10/21/2017.  04/16/2017. FINDINGS: Previous median sternotomy and CABG. Dual lead pacemaker appears the same. Extensive chronic bilateral pleural and parenchymal scarring left worse than right as seen previously. Question of a nodular mass in the right lower lung on the frontal view on the previous study. On today's study, this density appears similar. Argue oblique, it could be very slightly smaller. No new or progressive finding. Chronic degenerative changes affect the spine. IMPRESSION: Previous CABG and pacemaker. Extensive chronic pleural and parenchymal scarring left worse than right. Redemonstration of a small nodular shadow at the right lung base. This is similar, or perhaps very minimally smaller. One additional follow-up study would be suggested in 3-4 weeks. Electronically Signed   By: Nelson Chimes M.D.   On: 11/05/2017 12:01     Assessment & Plan:   Pleasant  patient seen in office today.  Patient was COPD exacerbation.  Patient to be treated with doxycycline, prednisone taper, chest x-ray today.  Will increase duo nebs to 4 times a day as well as add budesonide to help hopefully get closer control of his breathing.  Patient to keep follow-up in July.  Obstructive  sleep apnea Continue CPAP therapy  COPD mixed type (HCC) Doxycycline >>> 1 100 mg tablet every 12 hours for 7 days >>>take with food  >>>wear sunscreen   Prednisone >>>4 tabs for 2 days, then 3 tabs for 2 days, 2 tabs for 2 days, then 1 tab for 2 days, then stop  DuoNeb nebulizers 4 times a day  Pulmicort nebulizers every 12 hours >>> Take this daily   Chronic respiratory failure with hypoxia ([place]) Continue oxygen therapy     Lauraine Rinne, NP 11/23/2017

## 2017-11-23 NOTE — Assessment & Plan Note (Signed)
Doxycycline >>> 1 100 mg tablet every 12 hours for 7 days >>>take with food  >>>wear sunscreen   Prednisone >>>4 tabs for 2 days, then 3 tabs for 2 days, 2 tabs for 2 days, then 1 tab for 2 days, then stop  DuoNeb nebulizers 4 times a day  Pulmicort nebulizers every 12 hours >>> Take this daily

## 2017-11-23 NOTE — Assessment & Plan Note (Signed)
Continue CPAP therapy 

## 2017-11-23 NOTE — Patient Instructions (Addendum)
Doxycycline >>> 1 100 mg tablet every 12 hours for 7 days >>>take with food  >>>wear sunscreen   Prednisone >>>4 tabs for 2 days, then 3 tabs for 2 days, 2 tabs for 2 days, then 1 tab for 2 days, then stop  DuoNeb nebulizers 4 times a day  Pulmicort nebulizers every 12 hours >>> Take this daily   Continue CPAP therapy  Keep follow-up with Dr. Annamaria Boots in July   Please contact the office if your symptoms worsen or you have concerns that you are not improving.   Thank you for choosing [facility] for your healthcare, and for allowing [place] to partner with you on your healthcare journey. I am thankful to be able to provide care to you today.   Wyn Quaker FNP-C

## 2017-11-29 ENCOUNTER — Telehealth: Payer: Self-pay | Admitting: Pulmonary Disease

## 2017-11-29 DIAGNOSIS — J9611 Chronic respiratory failure with hypoxia: Secondary | ICD-10-CM

## 2017-11-29 DIAGNOSIS — J449 Chronic obstructive pulmonary disease, unspecified: Secondary | ICD-10-CM

## 2017-11-29 MED ORDER — PREDNISONE 10 MG PO TABS: ORAL_TABLET | ORAL | 0 refills | Status: DC

## 2017-11-29 NOTE — Telephone Encounter (Signed)
Discuss instructions from last office visit with patient:   Doxycycline >>> 1 100 mg tablet every 12 hours for 7 days >>>take with food  >>>wear sunscreen   Prednisone >>>4 tabs for 2 days, then 3 tabs for 2 days, 2 tabs for 2 days, then 1 tab for 2 days, then stop  DuoNeb nebulizers 4 times a day  Pulmicort nebulizers every 12 hours >>> Take this daily    Emphasize the importance of the DuoNeb's 4 times a day.  As well as the Pulmicort to see if there is any improvement.  I am okay with repeating the prednisone taper listed above.  Patient needs to finish doxycycline. If patient is not improving with these interventions then he needs to be seen in our office.  As well as having chest x-ray.  Please place order for prednisone taper.  Wyn Quaker FNP

## 2017-11-29 NOTE — Telephone Encounter (Signed)
Called and spoke with patient. Patient states that he dusted/cleaned his house on 6.22.19. 4 days after seeing Aaron Edelman in the office. Patient states that after he cleaned his house he started to cough, get increased SOB and chest tightness. Patient is producing thick yellow mucus. Patient uses nebulizer TID, finished his prednisone taper, and has two days left of doxy. Patient denies fever, chest pain, sneezing, or body aches.   Aaron Edelman, please advise, thank you.

## 2017-11-29 NOTE — Telephone Encounter (Signed)
Spoke with pt. He is aware of Brian's response. I have sent in another prescription for Prednisone. Nothing further was needed.

## 2017-12-03 DIAGNOSIS — H906 Mixed conductive and sensorineural hearing loss, bilateral: Secondary | ICD-10-CM | POA: Diagnosis not present

## 2017-12-03 DIAGNOSIS — H7203 Central perforation of tympanic membrane, bilateral: Secondary | ICD-10-CM | POA: Diagnosis not present

## 2017-12-03 DIAGNOSIS — H61892 Other specified disorders of left external ear: Secondary | ICD-10-CM | POA: Diagnosis not present

## 2017-12-06 ENCOUNTER — Telehealth: Payer: Self-pay | Admitting: Pulmonary Disease

## 2017-12-06 DIAGNOSIS — J449 Chronic obstructive pulmonary disease, unspecified: Secondary | ICD-10-CM

## 2017-12-06 DIAGNOSIS — J9611 Chronic respiratory failure with hypoxia: Secondary | ICD-10-CM

## 2017-12-06 MED ORDER — BUDESONIDE 0.25 MG/2ML IN SUSP: 0.2500 mg | Freq: Two times a day (BID) | RESPIRATORY_TRACT | 12 refills | Status: DC

## 2017-12-06 NOTE — Telephone Encounter (Signed)
Pt is requesting to pick up Rx for Pulmicort to take to [place].  Rx has been printed and placed up front for pick up. Pt is aware and voiced his understanding. Nothing further is needed.

## 2017-12-10 ENCOUNTER — Telehealth: Payer: Self-pay | Admitting: Cardiology

## 2017-12-10 NOTE — Telephone Encounter (Signed)
Spoke w/ pt in regards to [facility] fax informing us that pt has not followed up with them since 03/2017. Pt stated that he just seen Dr. Caryl Comes in 11/2017. I informed him that I would make [facility] aware. Pt verbalized understanding.

## 2018-01-04 ENCOUNTER — Encounter: Payer: Self-pay | Admitting: Internal Medicine

## 2018-01-05 ENCOUNTER — Ambulatory Visit (INDEPENDENT_AMBULATORY_CARE_PROVIDER_SITE_OTHER): Payer: Medicare Other | Admitting: Internal Medicine

## 2018-01-05 ENCOUNTER — Encounter: Payer: Self-pay | Admitting: Internal Medicine

## 2018-01-05 DIAGNOSIS — J9611 Chronic respiratory failure with hypoxia: Secondary | ICD-10-CM

## 2018-01-05 DIAGNOSIS — I6523 Occlusion and stenosis of bilateral carotid arteries: Secondary | ICD-10-CM | POA: Diagnosis not present

## 2018-01-05 DIAGNOSIS — N471 Phimosis: Secondary | ICD-10-CM | POA: Diagnosis not present

## 2018-01-05 DIAGNOSIS — J449 Chronic obstructive pulmonary disease, unspecified: Secondary | ICD-10-CM

## 2018-01-05 NOTE — Progress Notes (Signed)
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 07/08/17- 84 year old male former smoker,Veteran, followed for asthma/COPD, OSA, complicated by DM, obesity, CAD/ischemic CM/sick sinus/bradycardia tachycardia/pacemaker/chronic diastolic CHF, history of prostate cancer, anemia, chronic myeloid leukemia CPAP 12/[facility] [place]   O2 2-4LPOC/ sleep [facility] ----OSA; DME: [facility]. Pt wears CPAP and O2-DL attached. Pt needs assistance with O2 cords from [facility]  [facility] has suggested a M6 O2 tank for him for portability.  He remains dependent on oxygen. He feels CPAP 12 is too much pressure.  Download 94% compliance AHI 0.2/hour.  [facility] [place] [facility] been managing his CPAP but he asks we take over and get him back with [facility]  because the [place] is not sending supplies quickly when requested.. CXR 04/16/17- IMPRESSION: Bilateral pleural effusions, left greater than right. Pulmonary vascular congestion. Enlarged cardiac silhouette. Calcific atherosclerotic disease of the aorta.  01/05/2018- 84 year old male former smoker,Veteran, followed for asthma/COPD, OSA, complicated by DM, obesity, CAD/ischemic CM/sick sinus/bradycardia tachycardia/pacemaker/chronic diastolic CHF, history of prostate cancer, anemia, chronic myeloid leukemia CPAP 12/[facility] [place]   O2 2-4LPOC/ sleep [facility] -----OSA: DME: [facility] for CPAP and O2. Pt wears CPAP nightly with O2 and O2 during the day. New supplies needed for CPAP. At last OV he was treated for persistent bronchitis symptoms with doxycycline, prednisone taper, increased neb treatments to 4 times daily adding Pulmicort. Download 100% compliance AHI 0.2/hour.  He is comfortable  with his CPAP and does not want to sleep without it. Says O2 helps a lot.  He felt better after antibiotic treatment last visit but says Pulmicort does not help and it is not worth the cost.  Using his nebulizer about 3 times daily.  Get some meds from [place]. Sputum varies white to yellow with no blood.  He does not feel he needs antibiotic now. CXR 11/23/2017- IMPRESSION: Persistent increased density at both bases greatest on the left little changed from the previous study. No overt CHF. Density noted at the right lung base on the previous study is less conspicuous today.    ROS-see HPI + = positive Constitutional:   No-   weight loss, night sweats, fevers, chills, fatigue, lassitude. HEENT:   No-  headaches, difficulty swallowing, tooth/dental problems, sore throat,       No-  sneezing, itching, ear ache, nasal congestion, post nasal drip,  CV:  No- recent  chest pain, orthopnea, PND. +Mild chronic swelling in lower extremities,                                        No- anasarca, dizziness, palpitations Resp: +  shortness of breath with exertion or at rest.               +productive cough,   non-productive cough,  No- coughing up of blood.               +change in color of mucus.  No- wheezing.   Skin: No-   rash or lesions. GI:  No-   heartburn, indigestion, abdominal pain, nausea, vomiting,  GU:  MS: . Limiting hip pain Neuro-     nothing unusual Psych:  No- change in mood or affect. No depression or anxiety.  No memory loss.  OBJ General- Alert, Oriented, Affect-appropriate, Distress- none acute, +obese,  O2 3L Skin- rash-none, lesions- none, excoriation- none, + flushed cheeks Lymphadenopathy- none Head- atraumatic            Eyes- Gross vision intact, PERRLA, conjunctivae clear secretions            Ears- Hearing aids, HOH            Nose- Clear, no-Septal dev, mucus, polyps, erosion, perforation             Throat- Mallampati II , mucosa clear , drainage- none, tonsils-  atrophic, +dentures                     Neck- flexible , trachea midline, no stridor , thyroid nl, carotid no bruit Chest - symmetrical excursion , unlabored           Heart/CV- RRR /paced, no murmur , no gallop  , no rub, nl s1 s2                           - JVD- none , edema- none, stasis changes- none, varices- none           Lung- clear/ distant, unlabored, wheeze- none, cough-none,  dullness-none, rub-  none. .           Chest wall- + pacemaker left chest Abd- + colostomy Br/ Gen/ Rectal- Not done, not indicated Extrem- cyanosis- none, clubbing, none, atrophy- none, strength- deconditioned, +cane Neuro- grossly intact to observation

## 2018-01-05 NOTE — Patient Instructions (Signed)
Continue CPAP 12 and O2 2-4 l/m as you are doing  Agree you don't need antibiotics unless your sputum stays infected looking. If you feel an antibiotic would help you, let us know.  Please call if we can help.

## 2018-01-06 NOTE — Assessment & Plan Note (Signed)
Recent bronchitis is resolving gradually. Continue present meds.  Discussed use of nebulizer machine.

## 2018-01-06 NOTE — Assessment & Plan Note (Signed)
He continues to need an benefit from oxygen. Plan-continue oxygen 2 L

## 2018-01-11 ENCOUNTER — Encounter: Payer: Self-pay | Admitting: Internal Medicine

## 2018-01-11 ENCOUNTER — Other Ambulatory Visit (INDEPENDENT_AMBULATORY_CARE_PROVIDER_SITE_OTHER): Payer: Medicare Other

## 2018-01-11 ENCOUNTER — Ambulatory Visit (INDEPENDENT_AMBULATORY_CARE_PROVIDER_SITE_OTHER): Payer: Medicare Other | Admitting: Internal Medicine

## 2018-01-11 VITALS — BP 130/60 | HR 64 | Temp 98.1°F | Ht 66.0 in | Wt 231.0 lb

## 2018-01-11 DIAGNOSIS — R531 Weakness: Secondary | ICD-10-CM

## 2018-01-11 DIAGNOSIS — E118 Type 2 diabetes mellitus with unspecified complications: Secondary | ICD-10-CM

## 2018-01-11 DIAGNOSIS — E559 Vitamin D deficiency, unspecified: Secondary | ICD-10-CM | POA: Diagnosis not present

## 2018-01-11 DIAGNOSIS — D61818 Other pancytopenia: Secondary | ICD-10-CM

## 2018-01-11 DIAGNOSIS — I6523 Occlusion and stenosis of bilateral carotid arteries: Secondary | ICD-10-CM

## 2018-01-11 LAB — CBC
HCT: 27.1 % — ABNORMAL LOW (ref 39.0–52.0)
HEMOGLOBIN: 9.3 g/dL — AB (ref 13.0–17.0)
MCHC: 34.4 g/dL (ref 30.0–36.0)
MCV: 94.6 fl (ref 78.0–100.0)
PLATELETS: 145 10*3/uL — AB (ref 150.0–400.0)
RBC: 2.87 Mil/uL — ABNORMAL LOW (ref 4.22–5.81)
RDW: 14.7 % (ref 11.5–15.5)
WBC: 4.7 10*3/uL (ref 4.0–10.5)

## 2018-01-11 LAB — COMPREHENSIVE METABOLIC PANEL
ALBUMIN: 4.4 g/dL (ref 3.5–5.2)
ALK PHOS: 62 U/L (ref 39–117)
ALT: 10 U/L (ref 0–53)
AST: 14 U/L (ref 0–37)
BUN: 21 mg/dL (ref 6–23)
CALCIUM: 10.2 mg/dL (ref 8.4–10.5)
CHLORIDE: 105 meq/L (ref 96–112)
CO2: 27 mEq/L (ref 19–32)
CREATININE: 1.18 mg/dL (ref 0.40–1.50)
GFR: 61.81 mL/min (ref >60.00)
Glucose, Bld: 112 mg/dL — ABNORMAL HIGH (ref 70–99)
Potassium: 4.3 mEq/L (ref 3.5–5.1)
Sodium: 140 mEq/L (ref 135–145)
TOTAL PROTEIN: 7.3 g/dL (ref 6.0–8.3)
Total Bilirubin: 0.3 mg/dL (ref 0.2–1.2)

## 2018-01-11 LAB — HEMOGLOBIN A1C: Hgb A1c MFr Bld: 6.6 % — ABNORMAL HIGH (ref 4.6–6.5)

## 2018-01-11 LAB — VITAMIN D 25 HYDROXY (VIT D DEFICIENCY, FRACTURES): VITD: 23.15 ng/mL — ABNORMAL LOW (ref 30.00–100.00)

## 2018-01-11 NOTE — Assessment & Plan Note (Signed)
Needs CBC for monitoring. Not on any agents right now.

## 2018-01-11 NOTE — Patient Instructions (Signed)
We are checking the blood work today and call you back about the results.

## 2018-01-11 NOTE — Assessment & Plan Note (Signed)
Needs check for blood counts today and will check for vitamin levels as well. Checking Hga1c to check for adjustment of medications.

## 2018-01-11 NOTE — Assessment & Plan Note (Signed)
Needs HgA1c to ensure therapy is appropriate.

## 2018-01-11 NOTE — Progress Notes (Signed)
   Subjective:    Patient ID: Eric [name], male    DOB: May 01, 1927, 84 y.o.   MRN: [id number]  HPI The patient is an 84 YO man coming in for concerns about fatigue and muscle aches. He has been having pain in the right shoulder and arm as well as some balling into a fist in the evening. He denies change in diet. Cannot exercise and gets SOB with exertion. Does get muscle cramps. Denies blood in stool or tarry black stools. Some bruising which is not new. No recent changes to medicines.   Review of Systems  Constitutional: Positive for activity change, appetite change and fatigue.  HENT: Negative.   Eyes: Negative.   Respiratory: Negative for cough, chest tightness and shortness of breath.   Cardiovascular: Negative for chest pain, palpitations and leg swelling.  Gastrointestinal: Negative for abdominal distention, abdominal pain, constipation, diarrhea, nausea and vomiting.  Musculoskeletal: Positive for myalgias.  Skin: Negative.   Neurological: Negative.   Psychiatric/Behavioral: Negative.       Objective:   Physical Exam  Constitutional: He is oriented to person, place, and time. He appears well-developed and well-nourished.  HENT:  Head: Normocephalic and atraumatic.  Eyes: EOM are normal.  Neck: Normal range of motion.  Cardiovascular: Normal rate and regular rhythm.  Pulmonary/Chest: Effort normal. No respiratory distress. He has no wheezes. He has no rales.  On oxygen  Abdominal: Soft. Bowel sounds are normal. He exhibits no distension. There is no tenderness. There is no rebound.  Musculoskeletal: He exhibits no edema.  Neurological: He is alert and oriented to person, place, and time. Coordination normal.  Skin: Skin is warm and dry.   Vitals:   01/11/18 1356  BP: 130/60  Pulse: 64  Temp: 98.1 F (36.7 C)  TempSrc: Oral  SpO2: 97%  Weight: 231 lb (104.8 kg)  Height: 5\' 6"  (1.676 m)      Assessment & Plan:

## 2018-01-18 DIAGNOSIS — M7541 Impingement syndrome of right shoulder: Secondary | ICD-10-CM | POA: Diagnosis not present

## 2018-01-18 DIAGNOSIS — M25511 Pain in right shoulder: Secondary | ICD-10-CM | POA: Diagnosis not present

## 2018-01-24 ENCOUNTER — Encounter: Payer: Self-pay | Admitting: Internal Medicine

## 2018-01-27 ENCOUNTER — Telehealth: Payer: Self-pay | Admitting: Internal Medicine

## 2018-01-27 MED ORDER — DOXYCYCLINE HYCLATE 100 MG PO TABS: 100.0000 mg | ORAL_TABLET | Freq: Two times a day (BID) | ORAL | 0 refills | Status: DC

## 2018-01-27 NOTE — Telephone Encounter (Signed)
Please have him take doxy 100mg  bid x 7 days.

## 2018-01-27 NOTE — Telephone Encounter (Signed)
Called and spoke to patient. Patient stated that he usually see's Dr. Annamaria Boots and that his last visit was told if he developed symptoms to call in.  Patient stated that he has developed a cough with yellow mucus. Patient denies a fever or any other symptoms at this time. Patient declined an office visit for evaluation and is requesting an antibiotic.   CY is not in office, routing to Wyn Quaker since he has also seen patient in the past.

## 2018-01-27 NOTE — Telephone Encounter (Signed)
Dr. Byrum please advise 

## 2018-01-27 NOTE — Telephone Encounter (Signed)
Pt is aware of recommendations and voiced his understanding.  Rx for Doxy has been sent to preferred pharmacy. Nothing further is needed.

## 2018-01-27 NOTE — Telephone Encounter (Signed)
Please route to [place] FNP

## 2018-02-16 DIAGNOSIS — H02054 Trichiasis without entropian left upper eyelid: Secondary | ICD-10-CM | POA: Diagnosis not present

## 2018-02-16 DIAGNOSIS — H0100A Unspecified blepharitis right eye, upper and lower eyelids: Secondary | ICD-10-CM | POA: Diagnosis not present

## 2018-02-16 DIAGNOSIS — H5713 Ocular pain, bilateral: Secondary | ICD-10-CM | POA: Diagnosis not present

## 2018-02-16 DIAGNOSIS — H02051 Trichiasis without entropian right upper eyelid: Secondary | ICD-10-CM | POA: Diagnosis not present

## 2018-03-31 ENCOUNTER — Other Ambulatory Visit: Payer: Self-pay | Admitting: *Deleted

## 2018-03-31 MED ORDER — FUROSEMIDE 40 MG PO TABS: 40.0000 mg | ORAL_TABLET | Freq: Two times a day (BID) | ORAL | 0 refills | Status: AC

## 2018-04-07 ENCOUNTER — Telehealth: Payer: Self-pay | Admitting: Interventional Cardiology

## 2018-04-07 MED ORDER — SPIRONOLACTONE 25 MG PO TABS: 12.5000 mg | ORAL_TABLET | Freq: Every day | ORAL | 1 refills | Status: DC

## 2018-04-07 NOTE — Telephone Encounter (Signed)
Follow Up:; ° ° °Returning your call. °

## 2018-04-07 NOTE — Telephone Encounter (Signed)
Left message to call back  

## 2018-04-07 NOTE — Telephone Encounter (Signed)
Spoke with pt and he states that he has steady been gaining weight over the last few months.  Having swelling in both legs that improves with elevation.  SOB not any different than usual.  Pt has home O2.  States after last visit his weight had gotten down to 212lbs and this morning he was at 230lbs.  Went over diuretic regimen with pt.  Per our records he should be taking Furosemide 40mg  QD and Spironolactone 12.5mg  QD.  Pt states he has only been taking Furosemide 40mg  QD as this is what he was told to do.  Advised pt to go back on the regimen listed on his med list and call me on Monday if weight not coming down.  Told pt to keep appt for 11/18.  Pt verbalized understanding and was in agreement with this plan.

## 2018-04-07 NOTE — Telephone Encounter (Signed)
Pt c/o swelling: STAT is pt has developed SOB within 24 hours  How much weight have you gained and in what time span? 18 over 4 to 5 months  If swelling, where is the swelling located? Of his legs ok in the morning as day goes on gets worse  1) Are you currently taking a fluid pill? Yes   2) Are you currently SOB? Yes, he currently on oxygen, is on it 24hrs. Chest is not hurting.   3) Do you have a log of your daily weights (if so, list)? no  4) Have you gained 3 pounds in a day or 5 pounds in a week? no  5) Have you traveled recently? no

## 2018-04-12 ENCOUNTER — Other Ambulatory Visit: Payer: Self-pay

## 2018-04-12 ENCOUNTER — Inpatient Hospital Stay (HOSPITAL_COMMUNITY)
Admission: EM | Admit: 2018-04-12 | Discharge: 2018-04-20 | DRG: 308 | Disposition: A | Discharge status: Skilled Nursing Facility | Payer: Medicare Other | Source: Skilled Nursing Facility | Attending: Internal Medicine | Admitting: Internal Medicine

## 2018-04-12 ENCOUNTER — Encounter (HOSPITAL_COMMUNITY): Payer: Self-pay

## 2018-04-12 ENCOUNTER — Emergency Department (HOSPITAL_COMMUNITY): Payer: Medicare Other

## 2018-04-12 ENCOUNTER — Other Ambulatory Visit (HOSPITAL_COMMUNITY): Payer: Medicare Other

## 2018-04-12 DIAGNOSIS — J449 Chronic obstructive pulmonary disease, unspecified: Secondary | ICD-10-CM | POA: Diagnosis not present

## 2018-04-12 DIAGNOSIS — E1122 Type 2 diabetes mellitus with diabetic chronic kidney disease: Secondary | ICD-10-CM | POA: Diagnosis not present

## 2018-04-12 DIAGNOSIS — I5032 Chronic diastolic (congestive) heart failure: Secondary | ICD-10-CM | POA: Diagnosis not present

## 2018-04-12 DIAGNOSIS — Z515 Encounter for palliative care: Secondary | ICD-10-CM | POA: Diagnosis not present

## 2018-04-12 DIAGNOSIS — E669 Obesity, unspecified: Secondary | ICD-10-CM

## 2018-04-12 DIAGNOSIS — Z7189 Other specified counseling: Secondary | ICD-10-CM

## 2018-04-12 DIAGNOSIS — D631 Anemia in chronic kidney disease: Secondary | ICD-10-CM | POA: Diagnosis present

## 2018-04-12 DIAGNOSIS — G4733 Obstructive sleep apnea (adult) (pediatric): Secondary | ICD-10-CM | POA: Diagnosis present

## 2018-04-12 DIAGNOSIS — I4891 Unspecified atrial fibrillation: Secondary | ICD-10-CM | POA: Diagnosis not present

## 2018-04-12 DIAGNOSIS — R0602 Shortness of breath: Secondary | ICD-10-CM | POA: Diagnosis not present

## 2018-04-12 DIAGNOSIS — I25701 Atherosclerosis of coronary artery bypass graft(s), unspecified, with angina pectoris with documented spasm: Secondary | ICD-10-CM

## 2018-04-12 DIAGNOSIS — E785 Hyperlipidemia, unspecified: Secondary | ICD-10-CM | POA: Diagnosis present

## 2018-04-12 DIAGNOSIS — Z96651 Presence of right artificial knee joint: Secondary | ICD-10-CM | POA: Diagnosis present

## 2018-04-12 DIAGNOSIS — Z974 Presence of external hearing-aid: Secondary | ICD-10-CM

## 2018-04-12 DIAGNOSIS — I2510 Atherosclerotic heart disease of native coronary artery without angina pectoris: Secondary | ICD-10-CM | POA: Diagnosis present

## 2018-04-12 DIAGNOSIS — I959 Hypotension, unspecified: Secondary | ICD-10-CM | POA: Diagnosis not present

## 2018-04-12 DIAGNOSIS — E1151 Type 2 diabetes mellitus with diabetic peripheral angiopathy without gangrene: Secondary | ICD-10-CM | POA: Diagnosis present

## 2018-04-12 DIAGNOSIS — R079 Chest pain, unspecified: Secondary | ICD-10-CM | POA: Diagnosis not present

## 2018-04-12 DIAGNOSIS — Z66 Do not resuscitate: Secondary | ICD-10-CM | POA: Diagnosis present

## 2018-04-12 DIAGNOSIS — Z933 Colostomy status: Secondary | ICD-10-CM

## 2018-04-12 DIAGNOSIS — R7989 Other specified abnormal findings of blood chemistry: Secondary | ICD-10-CM | POA: Diagnosis present

## 2018-04-12 DIAGNOSIS — I5043 Acute on chronic combined systolic (congestive) and diastolic (congestive) heart failure: Secondary | ICD-10-CM | POA: Diagnosis present

## 2018-04-12 DIAGNOSIS — R2689 Other abnormalities of gait and mobility: Secondary | ICD-10-CM | POA: Diagnosis not present

## 2018-04-12 DIAGNOSIS — K219 Gastro-esophageal reflux disease without esophagitis: Secondary | ICD-10-CM | POA: Diagnosis not present

## 2018-04-12 DIAGNOSIS — D638 Anemia in other chronic diseases classified elsewhere: Secondary | ICD-10-CM

## 2018-04-12 DIAGNOSIS — I130 Hypertensive heart and chronic kidney disease with heart failure and stage 1 through stage 4 chronic kidney disease, or unspecified chronic kidney disease: Secondary | ICD-10-CM | POA: Diagnosis present

## 2018-04-12 DIAGNOSIS — N183 Chronic kidney disease, stage 3 unspecified: Secondary | ICD-10-CM

## 2018-04-12 DIAGNOSIS — R011 Cardiac murmur, unspecified: Secondary | ICD-10-CM | POA: Diagnosis present

## 2018-04-12 DIAGNOSIS — J9611 Chronic respiratory failure with hypoxia: Secondary | ICD-10-CM | POA: Diagnosis present

## 2018-04-12 DIAGNOSIS — E1169 Type 2 diabetes mellitus with other specified complication: Secondary | ICD-10-CM | POA: Diagnosis not present

## 2018-04-12 DIAGNOSIS — Z888 Allergy status to other drugs, medicaments and biological substances status: Secondary | ICD-10-CM | POA: Diagnosis not present

## 2018-04-12 DIAGNOSIS — I5023 Acute on chronic systolic (congestive) heart failure: Secondary | ICD-10-CM | POA: Diagnosis not present

## 2018-04-12 DIAGNOSIS — Z9842 Cataract extraction status, left eye: Secondary | ICD-10-CM

## 2018-04-12 DIAGNOSIS — R0902 Hypoxemia: Secondary | ICD-10-CM | POA: Diagnosis not present

## 2018-04-12 DIAGNOSIS — Z8249 Family history of ischemic heart disease and other diseases of the circulatory system: Secondary | ICD-10-CM

## 2018-04-12 DIAGNOSIS — Z87891 Personal history of nicotine dependence: Secondary | ICD-10-CM

## 2018-04-12 DIAGNOSIS — Z951 Presence of aortocoronary bypass graft: Secondary | ICD-10-CM | POA: Diagnosis not present

## 2018-04-12 DIAGNOSIS — I10 Essential (primary) hypertension: Secondary | ICD-10-CM | POA: Diagnosis not present

## 2018-04-12 DIAGNOSIS — Z79899 Other long term (current) drug therapy: Secondary | ICD-10-CM

## 2018-04-12 DIAGNOSIS — Z881 Allergy status to other antibiotic agents status: Secondary | ICD-10-CM

## 2018-04-12 DIAGNOSIS — I25708 Atherosclerosis of coronary artery bypass graft(s), unspecified, with other forms of angina pectoris: Secondary | ICD-10-CM | POA: Diagnosis not present

## 2018-04-12 DIAGNOSIS — K589 Irritable bowel syndrome without diarrhea: Secondary | ICD-10-CM | POA: Diagnosis present

## 2018-04-12 DIAGNOSIS — I700 Atherosclerosis of aorta: Secondary | ICD-10-CM | POA: Diagnosis present

## 2018-04-12 DIAGNOSIS — Z885 Allergy status to narcotic agent status: Secondary | ICD-10-CM

## 2018-04-12 DIAGNOSIS — I248 Other forms of acute ischemic heart disease: Secondary | ICD-10-CM | POA: Diagnosis not present

## 2018-04-12 DIAGNOSIS — I214 Non-ST elevation (NSTEMI) myocardial infarction: Secondary | ICD-10-CM | POA: Diagnosis not present

## 2018-04-12 DIAGNOSIS — Z7984 Long term (current) use of oral hypoglycemic drugs: Secondary | ICD-10-CM

## 2018-04-12 DIAGNOSIS — Z8546 Personal history of malignant neoplasm of prostate: Secondary | ICD-10-CM

## 2018-04-12 DIAGNOSIS — M6281 Muscle weakness (generalized): Secondary | ICD-10-CM | POA: Diagnosis not present

## 2018-04-12 DIAGNOSIS — I110 Hypertensive heart disease with heart failure: Secondary | ICD-10-CM | POA: Diagnosis present

## 2018-04-12 DIAGNOSIS — N179 Acute kidney failure, unspecified: Secondary | ICD-10-CM | POA: Diagnosis present

## 2018-04-12 DIAGNOSIS — H9190 Unspecified hearing loss, unspecified ear: Secondary | ICD-10-CM | POA: Diagnosis present

## 2018-04-12 DIAGNOSIS — Z9981 Dependence on supplemental oxygen: Secondary | ICD-10-CM

## 2018-04-12 DIAGNOSIS — F419 Anxiety disorder, unspecified: Secondary | ICD-10-CM | POA: Diagnosis present

## 2018-04-12 DIAGNOSIS — F411 Generalized anxiety disorder: Secondary | ICD-10-CM | POA: Diagnosis not present

## 2018-04-12 DIAGNOSIS — Z79891 Long term (current) use of opiate analgesic: Secondary | ICD-10-CM

## 2018-04-12 DIAGNOSIS — E739 Lactose intolerance, unspecified: Secondary | ICD-10-CM | POA: Diagnosis present

## 2018-04-12 DIAGNOSIS — D630 Anemia in neoplastic disease: Secondary | ICD-10-CM | POA: Diagnosis present

## 2018-04-12 DIAGNOSIS — R000 Tachycardia, unspecified: Secondary | ICD-10-CM | POA: Diagnosis not present

## 2018-04-12 DIAGNOSIS — E210 Primary hyperparathyroidism: Secondary | ICD-10-CM | POA: Diagnosis present

## 2018-04-12 DIAGNOSIS — M1990 Unspecified osteoarthritis, unspecified site: Secondary | ICD-10-CM | POA: Diagnosis present

## 2018-04-12 DIAGNOSIS — C9210 Chronic myeloid leukemia, BCR/ABL-positive, not having achieved remission: Secondary | ICD-10-CM | POA: Diagnosis present

## 2018-04-12 DIAGNOSIS — I444 Left anterior fascicular block: Secondary | ICD-10-CM | POA: Diagnosis present

## 2018-04-12 DIAGNOSIS — Z6837 Body mass index (BMI) 37.0-37.9, adult: Secondary | ICD-10-CM

## 2018-04-12 DIAGNOSIS — Z9841 Cataract extraction status, right eye: Secondary | ICD-10-CM

## 2018-04-12 DIAGNOSIS — J439 Emphysema, unspecified: Secondary | ICD-10-CM | POA: Diagnosis present

## 2018-04-12 DIAGNOSIS — I495 Sick sinus syndrome: Secondary | ICD-10-CM | POA: Diagnosis present

## 2018-04-12 DIAGNOSIS — E119 Type 2 diabetes mellitus without complications: Secondary | ICD-10-CM

## 2018-04-12 DIAGNOSIS — R0789 Other chest pain: Secondary | ICD-10-CM | POA: Diagnosis not present

## 2018-04-12 DIAGNOSIS — I358 Other nonrheumatic aortic valve disorders: Secondary | ICD-10-CM | POA: Diagnosis present

## 2018-04-12 DIAGNOSIS — I255 Ischemic cardiomyopathy: Secondary | ICD-10-CM | POA: Diagnosis present

## 2018-04-12 DIAGNOSIS — R778 Other specified abnormalities of plasma proteins: Secondary | ICD-10-CM

## 2018-04-12 DIAGNOSIS — Z950 Presence of cardiac pacemaker: Secondary | ICD-10-CM

## 2018-04-12 LAB — CBC WITH DIFFERENTIAL/PLATELET
BASOS PCT: 2 %
Basophils Absolute: 0.1 10*3/uL (ref 0.0–0.1)
EOS ABS: 0.2 10*3/uL (ref 0.0–0.5)
EOS PCT: 4 %
HCT: 34.7 % — ABNORMAL LOW (ref 39.0–52.0)
Hemoglobin: 10.3 g/dL — ABNORMAL LOW (ref 13.0–17.0)
Lymphocytes Relative: 6 %
Lymphs Abs: 0.4 10*3/uL — ABNORMAL LOW (ref 0.7–4.0)
MCH: 29.9 pg (ref 26.0–34.0)
MCHC: 29.7 g/dL — AB (ref 30.0–36.0)
MCV: 100.6 fL — ABNORMAL HIGH (ref 80.0–100.0)
MONO ABS: 0.6 10*3/uL (ref 0.1–1.0)
Monocytes Relative: 10 %
Neutro Abs: 4.8 10*3/uL (ref 1.7–7.7)
Neutrophils Relative %: 78 %
PLATELETS: 199 10*3/uL (ref 150–400)
RBC: 3.45 MIL/uL — AB (ref 4.22–5.81)
RDW: 13.4 % (ref 11.5–15.5)
WBC: 6.2 10*3/uL (ref 4.0–10.5)
nRBC: 0 % (ref 0.0–0.2)
nRBC: 0 /100 WBC

## 2018-04-12 LAB — COMPREHENSIVE METABOLIC PANEL
ALBUMIN: 3.7 g/dL (ref 3.5–5.0)
ALK PHOS: 60 U/L (ref 38–126)
ALT: 11 U/L (ref 0–44)
ANION GAP: 9 (ref 5–15)
AST: 22 U/L (ref 15–41)
BILIRUBIN TOTAL: 0.7 mg/dL (ref 0.3–1.2)
BUN: 18 mg/dL (ref 8–23)
CO2: 26 mmol/L (ref 22–32)
Calcium: 9.3 mg/dL (ref 8.9–10.3)
Chloride: 104 mmol/L (ref 98–111)
Creatinine, Ser: 1.34 mg/dL — ABNORMAL HIGH (ref 0.61–1.24)
GFR calc Af Amer: 52 mL/min — ABNORMAL LOW (ref >60)
GFR, EST NON AFRICAN AMERICAN: 45 mL/min — AB (ref >60)
GLUCOSE: 123 mg/dL — AB (ref 70–99)
Potassium: 3.7 mmol/L (ref 3.5–5.1)
Sodium: 139 mmol/L (ref 135–145)
TOTAL PROTEIN: 6.5 g/dL (ref 6.5–8.1)

## 2018-04-12 LAB — HEPARIN LEVEL (UNFRACTIONATED): Heparin Unfractionated: 0.26 IU/mL — ABNORMAL LOW (ref 0.30–0.70)

## 2018-04-12 LAB — URINALYSIS, ROUTINE W REFLEX MICROSCOPIC
Bacteria, UA: NONE SEEN
Bilirubin Urine: NEGATIVE
GLUCOSE, UA: NEGATIVE mg/dL
Ketones, ur: NEGATIVE mg/dL
LEUKOCYTES UA: NEGATIVE
Nitrite: NEGATIVE
PROTEIN: NEGATIVE mg/dL
Specific Gravity, Urine: 1.006 (ref 1.005–1.030)
pH: 6 (ref 5.0–8.0)

## 2018-04-12 LAB — TROPONIN I: Troponin I: 0.59 ng/mL (ref <0.03)

## 2018-04-12 LAB — GLUCOSE, CAPILLARY: Glucose-Capillary: 113 mg/dL — ABNORMAL HIGH (ref 70–99)

## 2018-04-12 LAB — ECHOCARDIOGRAM COMPLETE
HEIGHTINCHES: 66 in
Weight: 3648 oz

## 2018-04-12 LAB — LIPID PANEL
Cholesterol: 123 mg/dL (ref 0–200)
HDL: 25 mg/dL — ABNORMAL LOW (ref >40)
LDL Cholesterol: 66 mg/dL (ref 0–99)
Total CHOL/HDL Ratio: 4.9 RATIO
Triglycerides: 158 mg/dL — ABNORMAL HIGH (ref <150)
VLDL: 32 mg/dL (ref 0–40)

## 2018-04-12 LAB — I-STAT TROPONIN, ED: TROPONIN I, POC: 0.41 ng/mL — AB (ref 0.00–0.08)

## 2018-04-12 LAB — BRAIN NATRIURETIC PEPTIDE: B Natriuretic Peptide: 483 pg/mL — ABNORMAL HIGH (ref 0.0–100.0)

## 2018-04-12 LAB — CBG MONITORING, ED: Glucose-Capillary: 119 mg/dL — ABNORMAL HIGH (ref 70–99)

## 2018-04-12 MED ORDER — HYDROCODONE-ACETAMINOPHEN 7.5-325 MG PO TABS
1.0000 | ORAL_TABLET | Freq: Four times a day (QID) | ORAL | Status: DC | PRN
  Administered 2018-04-13 – 2018-04-14 (×4): 1 via ORAL
  Filled 2018-04-12 (×4): qty 1

## 2018-04-12 MED ORDER — ACETAMINOPHEN 325 MG PO TABS
650.0000 mg | ORAL_TABLET | ORAL | Status: DC | PRN
  Administered 2018-04-12: 650 mg via ORAL
  Filled 2018-04-12: qty 2

## 2018-04-12 MED ORDER — AMIODARONE HCL IN DEXTROSE 360-4.14 MG/200ML-% IV SOLN
30.0000 mg/h | INTRAVENOUS | Status: DC
  Administered 2018-04-13: 30 mg/h via INTRAVENOUS
  Filled 2018-04-12 (×2): qty 200

## 2018-04-12 MED ORDER — ACETAMINOPHEN 500 MG PO TABS: 1000.0000 mg | ORAL_TABLET | Freq: Four times a day (QID) | ORAL | Status: DC | PRN

## 2018-04-12 MED ORDER — PRAVASTATIN SODIUM 40 MG PO TABS
60.0000 mg | ORAL_TABLET | Freq: Every day | ORAL | Status: DC
  Administered 2018-04-12 – 2018-04-19 (×7): 60 mg via ORAL
  Filled 2018-04-12 (×7): qty 2

## 2018-04-12 MED ORDER — BUDESONIDE 0.25 MG/2ML IN SUSP
0.2500 mg | Freq: Two times a day (BID) | RESPIRATORY_TRACT | Status: DC
  Administered 2018-04-12 – 2018-04-20 (×16): 0.25 mg via RESPIRATORY_TRACT
  Filled 2018-04-12 (×17): qty 2

## 2018-04-12 MED ORDER — FLUTICASONE PROPIONATE 50 MCG/ACT NA SUSP
2.0000 | Freq: Every day | NASAL | Status: DC
  Administered 2018-04-13 – 2018-04-20 (×8): 2 via NASAL
  Filled 2018-04-12: qty 16

## 2018-04-12 MED ORDER — IPRATROPIUM-ALBUTEROL 0.5-2.5 (3) MG/3ML IN SOLN
3.0000 mL | Freq: Three times a day (TID) | RESPIRATORY_TRACT | Status: DC
  Administered 2018-04-12 – 2018-04-13 (×2): 3 mL via RESPIRATORY_TRACT
  Filled 2018-04-12 (×2): qty 3

## 2018-04-12 MED ORDER — ALPRAZOLAM 0.25 MG PO TABS
0.2500 mg | ORAL_TABLET | Freq: Every day | ORAL | Status: DC | PRN
  Administered 2018-04-12: 0.25 mg via ORAL
  Filled 2018-04-12: qty 1

## 2018-04-12 MED ORDER — AMIODARONE LOAD VIA INFUSION
150.0000 mg | Freq: Once | INTRAVENOUS | Status: AC
  Administered 2018-04-12: 150 mg via INTRAVENOUS
  Filled 2018-04-12: qty 83.34

## 2018-04-12 MED ORDER — SENNOSIDES 8.6 MG PO TABS: 2.0000 | ORAL_TABLET | Freq: Every day | ORAL | Status: DC

## 2018-04-12 MED ORDER — DILTIAZEM HCL 60 MG PO TABS: 60.0000 mg | ORAL_TABLET | Freq: Four times a day (QID) | ORAL | Status: DC

## 2018-04-12 MED ORDER — IPRATROPIUM-ALBUTEROL 0.5-2.5 (3) MG/3ML IN SOLN: 3.0000 mL | Freq: Four times a day (QID) | RESPIRATORY_TRACT | Status: DC | PRN

## 2018-04-12 MED ORDER — HEPARIN BOLUS VIA INFUSION
4000.0000 [IU] | Freq: Once | INTRAVENOUS | Status: AC
  Administered 2018-04-12: 4000 [IU] via INTRAVENOUS
  Filled 2018-04-12: qty 4000

## 2018-04-12 MED ORDER — SPIRONOLACTONE 12.5 MG HALF TABLET
12.5000 mg | ORAL_TABLET | Freq: Every day | ORAL | Status: DC
  Administered 2018-04-12: 12.5 mg via ORAL
  Filled 2018-04-12: qty 1

## 2018-04-12 MED ORDER — DILTIAZEM HCL 60 MG PO TABS
60.0000 mg | ORAL_TABLET | Freq: Three times a day (TID) | ORAL | Status: DC
  Administered 2018-04-12: 60 mg via ORAL
  Filled 2018-04-12: qty 1

## 2018-04-12 MED ORDER — FAMOTIDINE 20 MG PO TABS
40.0000 mg | ORAL_TABLET | Freq: Every day | ORAL | Status: DC
  Administered 2018-04-12 – 2018-04-19 (×8): 40 mg via ORAL
  Filled 2018-04-12 (×8): qty 2

## 2018-04-12 MED ORDER — HEPARIN (PORCINE) IN NACL 100-0.45 UNIT/ML-% IJ SOLN
1200.0000 [IU]/h | INTRAMUSCULAR | Status: DC
  Administered 2018-04-12: 1050 [IU]/h via INTRAVENOUS
  Administered 2018-04-13: 1150 [IU]/h via INTRAVENOUS
  Administered 2018-04-14: 1200 [IU]/h via INTRAVENOUS
  Filled 2018-04-12 (×3): qty 250

## 2018-04-12 MED ORDER — HYDROMORPHONE HCL 1 MG/ML IJ SOLN: 0.5000 mg | INTRAMUSCULAR | Status: DC

## 2018-04-12 MED ORDER — ASPIRIN 81 MG PO CHEW
324.0000 mg | CHEWABLE_TABLET | Freq: Once | ORAL | Status: DC
  Filled 2018-04-12: qty 4

## 2018-04-12 MED ORDER — POLYVINYL ALCOHOL 1.4 % OP SOLN
2.0000 [drp] | Freq: Three times a day (TID) | OPHTHALMIC | Status: DC | PRN
  Filled 2018-04-12: qty 15

## 2018-04-12 MED ORDER — FUROSEMIDE 40 MG PO TABS
40.0000 mg | ORAL_TABLET | Freq: Two times a day (BID) | ORAL | Status: DC
  Administered 2018-04-12 – 2018-04-16 (×7): 40 mg via ORAL
  Filled 2018-04-12 (×7): qty 1

## 2018-04-12 MED ORDER — ISOSORBIDE MONONITRATE ER 30 MG PO TB24
15.0000 mg | ORAL_TABLET | Freq: Every day | ORAL | Status: DC
  Administered 2018-04-12 – 2018-04-20 (×9): 15 mg via ORAL
  Filled 2018-04-12 (×9): qty 1

## 2018-04-12 MED ORDER — GUAIFENESIN-DM 100-10 MG/5ML PO SYRP
5.0000 mL | ORAL_SOLUTION | ORAL | Status: DC | PRN
  Administered 2018-04-12 – 2018-04-13 (×2): 5 mL via ORAL
  Filled 2018-04-12 (×2): qty 5

## 2018-04-12 MED ORDER — SENNA 8.6 MG PO TABS
2.0000 | ORAL_TABLET | Freq: Every day | ORAL | Status: DC
  Administered 2018-04-13 – 2018-04-20 (×8): 17.2 mg via ORAL
  Filled 2018-04-12 (×8): qty 2

## 2018-04-12 MED ORDER — POLYETHYL GLYCOL-PROPYL GLYCOL 0.4-0.3 % OP SOLN: 2.0000 [drp] | Freq: Three times a day (TID) | OPHTHALMIC | Status: DC | PRN

## 2018-04-12 MED ORDER — METOPROLOL TARTRATE 5 MG/5ML IV SOLN
10.0000 mg | Freq: Once | INTRAVENOUS | Status: AC
  Administered 2018-04-12: 10 mg via INTRAVENOUS
  Filled 2018-04-12: qty 10

## 2018-04-12 MED ORDER — AMIODARONE HCL IN DEXTROSE 360-4.14 MG/200ML-% IV SOLN
60.0000 mg/h | INTRAVENOUS | Status: DC
  Administered 2018-04-13: 60 mg/h via INTRAVENOUS
  Filled 2018-04-12 (×2): qty 200

## 2018-04-12 MED ORDER — HYPROMELLOSE (GONIOSCOPIC) 2.5 % OP SOLN
2.0000 [drp] | Freq: Three times a day (TID) | OPHTHALMIC | Status: DC | PRN
  Filled 2018-04-12: qty 15

## 2018-04-12 MED ORDER — IPRATROPIUM-ALBUTEROL 0.5-2.5 (3) MG/3ML IN SOLN
3.0000 mL | Freq: Once | RESPIRATORY_TRACT | Status: AC
  Administered 2018-04-12: 3 mL via RESPIRATORY_TRACT
  Filled 2018-04-12: qty 3

## 2018-04-12 MED ORDER — IRBESARTAN 150 MG PO TABS: 150.0000 mg | ORAL_TABLET | Freq: Every day | ORAL | Status: DC

## 2018-04-12 MED ORDER — ONDANSETRON HCL 4 MG/2ML IJ SOLN
4.0000 mg | Freq: Four times a day (QID) | INTRAMUSCULAR | Status: DC | PRN
  Administered 2018-04-13 – 2018-04-19 (×4): 4 mg via INTRAVENOUS
  Filled 2018-04-12 (×5): qty 2

## 2018-04-12 MED ORDER — NITROGLYCERIN 0.4 MG SL SUBL: 0.4000 mg | SUBLINGUAL_TABLET | SUBLINGUAL | Status: DC | PRN

## 2018-04-12 NOTE — Progress Notes (Addendum)
CARDIOLOGY CONSULT NOTE   Patient ID: TUSHAR ENNS MRN: [id number], DOB/AGE: [birth year]   Admit date: 04/12/2018 Date of Consult: 04/12/2018   Primary Physician: [name], MD Primary Cardiologist: Dr. Tamala Julian  Pt. Profile  84 y.o male with pmh listed below presented with complaint of sob and chest pain. He had atrial tachycardia noted on ekg with accompanied troponin elevation. Subsequent conversion to atrial fibrillation rhythm while in the ED.   Problem List  Past Medical History:  Diagnosis Date  . ALLERGIC RHINITIS   . ANEMIA-NOS   . AORTIC SCLEROSIS   . Asthma   . CARDIOMYOPATHY, ISCHEMIC   . CAROTID BRUIT, RIGHT 02/27/2008  . Cataract    surgery  . CML (chronic myeloid leukemia) ([place]) 06/26/2015  . COPD   . CORONARY ARTERY DISEASE    a. s/p CABG in 1995 b. DES in 2008, 2009, and most recent in 2012 with DES to SVG-OM  . DIABETES MELLITUS-TYPE II    diet controlled  . Diastolic dysfunction, Grade 1 11/24/2014  . Diverticulitis of colon with perforation 11/22/2014  . Diverticulosis   . GERD   . HIATAL HERNIA   . Hx of echocardiogram    Echo (9/15):  Mild LVH, EF 50-55%, no RWMA, Gr 1 DD, MAC, mild LAE.  [name] HYPERLIPIDEMIA   . HYPERTENSION   . Hyponatremia 11/22/2014  . IBS (irritable bowel syndrome)   . LACTOSE INTOLERANCE   . OA (osteoarthritis)   . OBESITY   . On home oxygen therapy    "3L all the time" (10/12/2016)  . Partial small bowel obstruction ([place])   . PERIPHERAL VASCULAR DISEASE   . Primary hyperparathyroidism ([place])    Lab Results Component Value Date  PTH 150.7* 02/13/2013  CALCIUM 11.0* 02/13/2013  CAION 1.21 03/15/2008    . Prostate cancer ([place])    seed implants 2004  . SICK SINUS/ TACHY-BRADY SYNDROME 09/2007   s/p PPM st judes  . Sleep apnea   . SMALL BOWEL OBSTRUCTION 04/18/2009   Qualifier: History of  By: Asa Lente MD, Jannifer Rodney Symptomatic diverticulosis 01/18/2009   Qualifier: Diagnosis of  By: Shane Crutch, Amy S     Past  Surgical History:  Procedure Laterality Date  . BACK SURGERY    . Bilateral cataracts    . COLON RESECTION N/A 11/28/2014   Procedure: EXPLORATORY LAPAROTOMY, SIGMOID COLECTOMY WITH COLOSTOMY;  Surgeon: Jackolyn Confer, MD;  Location: [facility];  Service: General;  Laterality: N/A;  . COLON SURGERY    . COLONOSCOPY    . CORONARY ARTERY BYPASS GRAFT    . ESOPHAGOGASTRODUODENOSCOPY  multiple  . FLEXIBLE SIGMOIDOSCOPY N/A 09/22/2013   Procedure: FLEXIBLE SIGMOIDOSCOPY;  Surgeon: Gatha Mayer, MD;  Location: [facility];  Service: Endoscopy;  Laterality: N/A;  . INGUINAL HERNIA REPAIR Bilateral   . LUMBAR [place] SURGERY  12/2008  . PACEMAKER INSERTION     DDD/[employer]         Last interrogation 2/13  on chart     Pacemaker guideline order Dr Tamala Julian on chart  . Partial small bowel obstruction  2009  . PENILE PROSTHESIS PLACEMENT    . PTCA  2008, 2009, 2012   with DES  . REMOVAL OF PENILE PROSTHESIS N/A 04/22/2017   Procedure: EXPLANT OF MULTICOMPONENT PENILE PROSTHESIS;  Surgeon: Irine Seal, MD;  Location: [facility];  Service: Urology;  Laterality: N/A;  . TOTAL HIP ARTHROPLASTY  08/21/2011   Procedure: TOTAL HIP ARTHROPLASTY;  Surgeon: Johnn Hai, MD;  Location: [facility];  Service: Orthopedics;  Laterality: Right;     Allergies  Allergies  Allergen Reactions  . Actos [Pioglitazone Hydrochloride] Other (See Comments)    "felt funny, drowsy, and weak":  [name] Buprenorphine Hcl Nausea And Vomiting  . Celebrex [Celecoxib] Other (See Comments)    "felt funny"  . Demerol Palpitations and Other (See Comments)    Increased BP  . Meperidine Palpitations    Other reaction(s): Other (See Comments) Increased BP  . Morphine And Related Nausea And Vomiting  . Ciprofloxacin Other (See Comments)    arthralgia  . Metformin Nausea And Vomiting  . Zocor [Simvastatin] Other (See Comments)    Makes pt very drowsy    HPI   Mr. Winemiller is a 84 y.o male with CAD with CABG in 1995 and subsequent  cath in 2012 resulting in stenting of the obtuse marginals, Aortic sclerosis, ischemic cardiomyopathy, COPD on 3L at home, sick sinus tachy-brady syndrome with pacemaker in place, CML,  essential hypertension who presented with complaint of tachycardia, chest pain, fatigue, and generalized feeling of being "unwell" for the past 3-4 days. He says that he was previously able to walk at least 70-100 yards, but has not been able to even walk to the bathroom in his house.The patient states that he has frequent chest pain that is usually relieved with zantac, but he felt that the chest pain the morning of admission was worse than previously.  He describes the chest pain is substernal, achy in nature, 4-5/10 intensity, radiating across both shoulders, and a feeling of the pain going to his back. He used two nitroglycerin tablets for the chest pain in the morning.   The patient took his blood pressure at home and it was noted to have systolic pressures in the 696V, diastolic in the 89F, and his pulse was in 140s. The patient's ekg shows sinus rhythm with T wave inversions in v1 and avr. Troponin elevation to 0.41. Chest x-ray showing chronic emphysematous changes. Last echo in Dec 2017 showing lvefr 55-65%, g1dd, mildly calcified mitral annulus.  Inpatient Medications  . heparin  4,000 Units Intravenous Once    Family History Family History  Problem Relation Age of Onset  . Hypertension Mother   . Cancer Mother   . Heart attack Unknown   . Heart attack Unknown   . Heart attack Brother   . Colon cancer Neg Hx   . Stroke Neg Hx      Social History Social History   Socioeconomic History  . Marital status: Widowed    Spouse name: Not on file  . Number of children: Not on file  . Years of education: Not on file  . Highest education level: Not on file  Occupational History  . Occupation: retired    Fish farm manager: RETIRED  Social Needs  . Financial resource strain: Not on file  . Food insecurity:     Worry: Not on file    Inability: Not on file  . Transportation needs:    Medical: Not on file    Non-medical: Not on file  Tobacco Use  . Smoking status: Former Smoker    Packs/day: 1.00    Years: 25.00    Pack years: 25.00    Types: Cigarettes    Last attempt to quit: 06/08/1994    Years since quitting: 23.8  . Smokeless tobacco: Never Used  Substance and Sexual Activity  . Alcohol use: No    Alcohol/week:  0.0 standard drinks  . Drug use: No  . 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 Physical activity:    Days per week: Not on file    Minutes per session: Not on file  . Stress: Not on file  Relationships  . Social connections:    Talks on phone: Not on file    Gets together: Not on file    Attends religious service: Not on file    Active member of club or organization: Not on file    Attends meetings of clubs or organizations: Not on file    Relationship status: Not on file  . Intimate partner violence:    Fear of current or ex partner: Not on file    Emotionally abused: Not on file    Physically abused: Not on file    Forced sexual activity: Not on file  Other Topics Concern  . Not on file  Social History Narrative  . Not on file     Review of Systems  Review of Systems  Constitutional: Negative for chills and fever.  HENT: Positive for hearing loss.   Respiratory: Positive for cough and shortness of breath.   Cardiovascular: Positive for chest pain and palpitations. Negative for leg swelling.  Gastrointestinal: Negative for abdominal pain, nausea and vomiting.  Neurological: Negative for dizziness.   All other systems reviewed and are otherwise negative except as noted above.  Physical Exam  Blood pressure 115/63, pulse (!) 50, temperature 98.6 F (37 C), temperature source Oral, resp. rate (!) 24, height 5' 6"  (1.676 m), weight 103.4 kg, SpO2 100 %.   Physical Exam    Constitutional: He appears well-developed and well-nourished. No distress.  HENT:  Head: Normocephalic and atraumatic.  Eyes: Conjunctivae are normal.  Cardiovascular: An irregularly irregular rhythm present. Tachycardia present.  Respiratory: Effort normal and breath sounds normal. No respiratory distress. He has no wheezes.  Rhonchi present bilaterally  GI: Soft. He exhibits no distension.  Colostomy bag in left lower quadrant  Musculoskeletal: He exhibits no edema.  Neurological: He is alert.  Skin: He is not diaphoretic.  Psychiatric: He has a normal mood and affect. His behavior is normal. Judgment and thought content normal.    Labs  No results for input(s): CKTOTAL, CKMB, TROPONINI in the last 72 hours. Lab Results  Component Value Date   WBC 6.2 04/12/2018   HGB 10.3 (L) 04/12/2018   HCT 34.7 (L) 04/12/2018   MCV 100.6 (H) 04/12/2018   PLT 199 04/12/2018    Recent Labs  Lab 04/12/18 1135  NA 139  K 3.7  CL 104  CO2 26  BUN 18  CREATININE 1.34*  CALCIUM 9.3  PROT 6.5  BILITOT 0.7  ALKPHOS 60  ALT 11  AST 22  GLUCOSE 123*   Lab Results  Component Value Date   CHOL 108 05/27/2015   HDL 22.70 (L) 05/27/2015   LDLCALC 55 05/27/2015   TRIG 151.0 (H) 05/27/2015   No results found for: DDIMER  Radiology/Studies  Dg Chest Port 1 View  Result Date: 04/12/2018 CLINICAL DATA:  Chronic shortness of breath. EXAM: PORTABLE CHEST 1 VIEW COMPARISON:  11/23/2017 FINDINGS: The cardiac silhouette, mediastinal and hilar contours are normal and stable. Stable surgical changes from bypass surgery. Stable pacer wires. Chronic left basilar pleural thickening and parenchymal scarring. Stable emphysematous changes and areas of pulmonary scarring. No definite acute overlying pulmonary process.  The bony thorax is intact. IMPRESSION: 1. Chronic lung changes with emphysema and pulmonary scarring. 2. Chronic left basilar pleural thickening and parenchymal scarring. 3. No acute  overlying pulmonary findings. Electronically Signed   By: Marijo Sanes M.D.   On: 04/12/2018 12:16    ECG  04/12/18  1st ekg-sinus rhythm, atrial tachycardia 2nd ekg-atrial fibrillation  ASSESSMENT AND PLAN  Chest pain with troponemia-ischemic vs demand  Patient has atrial tachycardia noted on initial ekg along with a troponin elevation to 0.41. Started on heparin and given iv metoprolol 30m.   -Trend troponin to peak -Continue IV heparin -Aspirin 3258mqd -Started Imdur  -Started diltiazem 6038m6hrs due to the patient having severe rhonchi opted against b-blocker -continue home pravastatin 34m73m  Atrial fibrillation  The patient has new onset atrial fibrillation noted on telemetry and on ekg that started few hours after his admission. Remains hemodynamically stable with pressures ranging 110-130s/60-80s. He does not have history of afib. Risk factors for this episode include diabtes mellitus, obesity, hypertension, CAD.  CHA2DS2-VASc score of 4 placing 4.8% risk for stroke per year and 6.7% risk for stroke/tia/systemic embolism.   -TTE ordered -TSH, T4  -Po diltiazem 34mg45mrs  COPD The patient has copd mixed type for which he is being followe by Dr. YoungAnnamaria Bootsonically on 3L oxygen at home.   -Duonebs q6hrs prn  Diabetes Mellitus  Blood glucose ranging 120s since admission. Will monitor   Signed, VahinLars Mage FACC [facility]/2019, 1:42 PM  Patient seen and examined. Agree with assessment and plan.  Mr. WilliCorby Vandenberghe11 ye89 old Caucasian male who is followed by Dr. Hank Pernell Dupre has established CAD in 1995 underwent CABG revascularization.  In 2012 he underwent stenting of his obtuse marginal vessels.  He has a history of sick sinus syndrome with bradycardia tachycardia variant and is status post permanent pacemaker followed by Dr. KleinCaryl Comes has a history of essential hypertension, CML, and for the past several days has noticed exertional shortness of breath  with recurrent chest pain.  This morning he experienced substernal chest discomfort radiating across his shoulders and feeling the pain go to his back.  He took 2 sublingual nitroglycerin.  He noticed heart rate irregularity.  He presented to the emergency room.  ECG showed sinus tach versus ectopic atrial tachycardia at 140 bpm and subsequent ECG showed atrial fibrillation with rapid ventricular rate.  He has been anticoagulated with intravenous heparin.  Initial troponin was elevated at 0.41.  His chest discomfort has improved.  A chest x-ray has revealed chronic emphysematous changes.  He admits to wheezing and has seal [employer] exacerbation.  On exam in the emergency room blood pressure was 1 10-1 70-2ystolic with diastolics in the 70s. 63Zythm is atrial fibrillation with ventricular rate at approximately 115 bpm.  HEENT is unremarkable.  He has thick neck.  Mallampati scale is at least a 3/4.  He has diffuse rhonchi bilaterally with some wheezing.  Rhythm is irregularly irregular.  There is a 1/6 to 2/6 systolic murmur.  There is no rub.  Abdomen is protuberant and he has a left lower hernia in the region of his colostomy bag.  There is no significant lower extremity edema.  Neurologic exam is grossly nonfocal.  Laboratory notable for creatinine 1.34.  BNP 483.  Troponin 0 0.41.  Hemoglobin 10.3, hematocrit 34.7.  MCV 100.6.  Echo Doppler study was done in the emergency room following my examination which was technically difficult shows  mildly reduced ejection fraction at 45% with mild LVH, aortic valve calcification without aortic stenosis by Doppler and there is evidence for a mildly dilated aortic root.  Will obtain serial troponins.  Patient will need nebulizer therapy.  And to continue heparin with his atrial fibrillation and potential ischemic symptomatology.  Since he has wheezing and diffuse rhonchi he is not a candidate presently for beta-blocker therapy.  We will start Cardizem 60 mg every 6 hours for blood  pressure, rate control, and potential anti-ischemic benefit.  Will initiate nitrates.  Plan pacemaker interrogation.  Once he is more stable, consider possible ischemic evaluation with definitive repeat cardiac catheterization.  Troy Sine, MD, [facility] 04/12/2018 5:43 PM

## 2018-04-12 NOTE — Progress Notes (Signed)
Notified Cardiology of elevated HR 140 updated him of vitals and troponin 0.59. Orders received to start IV amiodarone. I will continue to monitor.

## 2018-04-12 NOTE — ED Notes (Signed)
ED Provider at bedside. 

## 2018-04-12 NOTE — ED Notes (Signed)
Urine culture save tube sent with urine specimen.

## 2018-04-12 NOTE — ED Provider Notes (Signed)
Medical screening examination/treatment/procedure(s) were conducted as a shared visit with non-physician practitioner(s) and myself.  I personally evaluated the patient during the encounter.  EKG Interpretation  Date/Time:  Tuesday April 12 2018 13:57:47 EST Ventricular Rate:  112 PR Interval:    QRS Duration: 93 QT Interval:  339 QTC Calculation: 463 R Axis:   -44 Text Interpretation:  Atrial fibrillation Left axis deviation Low voltage, precordial leads Consider anterior infarct Confirmed by Fredia Sorrow [phone]) on 04/12/2018 2:01:43 PM   Patient seen by me along with physician assistant.  Patient lives at currently in independent living.  Patient presenting today with concern for fast heart rate shortness of breath and some chest pain.  Fast heart rate he thinks is been ongoing for a couple days.  Patient normally on 3 L of oxygen.  Patient was brought in by EMS.  They noticed that heart rate was in the 140s.  Patient is on 3 L of oxygen at all times.  Usually has some shortness of breath but it was much worse this morning.  Patient has a history of congestive heart failure as well as a colostomy.  Patient does not have a history of any heart arrhythmias that he is aware of.  Patient with some bilateral wheezing on exam more to the left and right heart rate was around 140s appeared to be sinus tach.  But he was given some Lopressor slow his heart rate down and look like underlying rhythm atrial fibrillation.  Patient had elevated troponin could be due to rate ischemia.  However based on this patient started on heparin.  Cardiology consulted.  Seen by cardiology.  Patient will require admission.  Primary hospitalist admission.  Patient was given breathing treatments.  He did feel better following the breathing treatments.  Not clear whether breathing treatment was contributing to the fast heart rate.  Sounds as if maybe patient's heart rate had been fast for a while.  Lopressor brought  patient's heart rate down to around the 100s.  Now her having recording heart rate in the 60s.  Critical care time recorded by physician assistant.   Fredia Sorrow, MD 04/12/18 [phone]

## 2018-04-12 NOTE — ED Notes (Signed)
CBG 119 

## 2018-04-12 NOTE — Progress Notes (Signed)
Pts HR 140's and BP 168/134. Pt asymptomatic, MD notified, awaiting new orders.

## 2018-04-12 NOTE — ED Notes (Signed)
Echo at bedside

## 2018-04-12 NOTE — ED Notes (Signed)
X-ray at bedside

## 2018-04-12 NOTE — Progress Notes (Signed)
  Echocardiogram 2D Echocardiogram has been performed.  Eric [name] 04/12/2018, 4:16 PM

## 2018-04-12 NOTE — H&P (Signed)
History and Physical    Eric [name] NID:[id number] DOB: 17-Jan-1927 DOA: 04/12/2018  PCP: [name], MD Consultants:  Cardiology Patient coming from: independent living  Chief Complaint: SOB  HPI: Eric [name] is a 84 y.o. male with medical history significant for CAD, Aortic sclerosis, ischemic cardiomyopathy, COPD on 3L at home, sick sinus tachy-brady syndrome s/p PPM, essential hypertension who presented with c/o dyspnea, tachycardia, chest pain. Pt endorses 2-3 days of increased fatigue, and general malaise for the past 3-4 days. He states that he has not been able walk to the bathroom despite previously being able to walk up to 100 yards. He denies change to his chronic cough, but does feel that he has been wheezing more than usual. The patient states that he has frequent chest pain that is usually relieved with zantac, but he felt that the chest pain this morning was worse and different quality than his usual episodes of GERD.  He describes the chest pain as achy, substernal, up to 5/10 intensity, radiating across both shoulders and to his back. CP was relieved somewhat by 2 NTG. At home, pt's BP 160s/70s and his pulse was in 140s.  ED Course:  EKG in the ED showed ST vs atrial ectopic tachycardia, T wave inversions in v1 and avr. Troponin elevation to 0.41. Chest x-ray showing chronic emphysematous changes. After metoprolol, HR slowed to ~110 and EKG showed afib. Last echo in Dec 2017 showing EF 55-65%, grade I DD, mildly calcified mitral annulus. Cards has seen, initiated management for NSTEMI including heparin gtt, risk stratification. Breathing stabilized after breathing treatment. Satting 100% on home 3L.  Review of Systems: As per HPI; otherwise review of systems reviewed and negative.   Ambulatory Status:  Ambulates without assistance  Past Medical History:  Diagnosis Date  . ALLERGIC RHINITIS   . ANEMIA-NOS   . AORTIC SCLEROSIS   . Asthma   . CARDIOMYOPATHY,  ISCHEMIC   . CAROTID BRUIT, RIGHT 02/27/2008  . Cataract    surgery  . CML (chronic myeloid leukemia) ([place]) 06/26/2015  . COPD   . CORONARY ARTERY DISEASE    a. s/p CABG in 1995 b. DES in 2008, 2009, and most recent in 2012 with DES to SVG-OM  . DIABETES MELLITUS-TYPE II    diet controlled  . Diastolic dysfunction, Grade 1 11/24/2014  . Diverticulitis of colon with perforation 11/22/2014  . Diverticulosis   . GERD   . HIATAL HERNIA   . Hx of echocardiogram    Echo (9/15):  Mild LVH, EF 50-55%, no RWMA, Gr 1 DD, MAC, mild LAE.  [name] HYPERLIPIDEMIA   . HYPERTENSION   . Hyponatremia 11/22/2014  . IBS (irritable bowel syndrome)   . LACTOSE INTOLERANCE   . OA (osteoarthritis)   . OBESITY   . On home oxygen therapy    "3L all the time" (10/12/2016)  . Partial small bowel obstruction ([place])   . PERIPHERAL VASCULAR DISEASE   . Primary hyperparathyroidism ([place])    Lab Results Component Value Date  PTH 150.7* 02/13/2013  CALCIUM 11.0* 02/13/2013  CAION 1.21 03/15/2008    . Prostate cancer ([place])    seed implants 2004  . SICK SINUS/ TACHY-BRADY SYNDROME 09/2007   s/p PPM st [employer]  . Sleep apnea   . SMALL BOWEL OBSTRUCTION 04/18/2009   Qualifier: History of  By: Asa Lente MD, Jannifer Rodney Symptomatic diverticulosis 01/18/2009   Qualifier: Diagnosis of  By: Shane Crutch, Amy S  Past Surgical History:  Procedure Laterality Date  . BACK SURGERY    . Bilateral cataracts    . COLON RESECTION N/A 11/28/2014   Procedure: EXPLORATORY LAPAROTOMY, SIGMOID COLECTOMY WITH COLOSTOMY;  Surgeon: Jackolyn Confer, MD;  Location: [facility];  Service: General;  Laterality: N/A;  . COLON SURGERY    . COLONOSCOPY    . CORONARY ARTERY BYPASS GRAFT    . ESOPHAGOGASTRODUODENOSCOPY  multiple  . FLEXIBLE SIGMOIDOSCOPY N/A 09/22/2013   Procedure: FLEXIBLE SIGMOIDOSCOPY;  Surgeon: Gatha Mayer, MD;  Location: [facility];  Service: Endoscopy;  Laterality: N/A;  . INGUINAL HERNIA REPAIR Bilateral   . LUMBAR Wimer  SURGERY  12/2008  . PACEMAKER INSERTION     DDD/[employer]         Last interrogation 2/13  on chart     Pacemaker guideline order Dr Tamala Julian on chart  . Partial small bowel obstruction  2009  . PENILE PROSTHESIS PLACEMENT    . PTCA  2008, 2009, 2012   with DES  . REMOVAL OF PENILE PROSTHESIS N/A 04/22/2017   Procedure: EXPLANT OF MULTICOMPONENT PENILE PROSTHESIS;  Surgeon: Irine Seal, MD;  Location: [facility];  Service: Urology;  Laterality: N/A;  . TOTAL HIP ARTHROPLASTY  08/21/2011   Procedure: TOTAL HIP ARTHROPLASTY;  Surgeon: Johnn Hai, MD;  Location: [facility];  Service: Orthopedics;  Laterality: Right;    Social History   Socioeconomic History  . Marital status: Widowed    Spouse name: Not on file  . Number of children: Not on file  . Years of education: Not on file  . Highest education level: Not on file  Occupational History  . Occupation: retired    Fish farm manager: RETIRED  Social Needs  . Financial resource strain: Not on file  . Food insecurity:    Worry: Not on file    Inability: Not on file  . Transportation needs:    Medical: Not on file    Non-medical: Not on file  Tobacco Use  . Smoking status: Former Smoker    Packs/day: 1.00    Years: 25.00    Pack years: 25.00    Types: Cigarettes    Last attempt to quit: 06/08/1994    Years since quitting: 23.8  . Smokeless tobacco: Never Used  Substance and Sexual Activity  . Alcohol use: No    Alcohol/week: 0.0 standard drinks  . Drug use: No  . 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 Physical activity:    Days per week: Not on file    Minutes per session: Not on file  . Stress: Not on file  Relationships  . Social connections:    Talks on phone: Not on file    Gets together: Not on file    Attends religious service: Not on file    Active member of club or organization: Not on file    Attends meetings of clubs or organizations: Not on  file    Relationship status: Not on file  . Intimate partner violence:    Fear of current or ex partner: Not on file    Emotionally abused: Not on file    Physically abused: Not on file    Forced sexual activity: Not on file  Other Topics Concern  . Not on file  Social History Narrative  . Not on file    Allergies  Allergen Reactions  . Actos [  Pioglitazone Hydrochloride] Other (See Comments)    "felt funny, drowsy, and weak":  [name] Buprenorphine Hcl Nausea And Vomiting  . Celebrex [Celecoxib] Other (See Comments)    "felt funny"  . Demerol Palpitations and Other (See Comments)    Increased BP  . Meperidine Palpitations    Other reaction(s): Other (See Comments) Increased BP  . Morphine And Related Nausea And Vomiting  . Ciprofloxacin Other (See Comments)    arthralgia  . Metformin Nausea And Vomiting  . Zocor [Simvastatin] Other (See Comments)    Makes pt very drowsy    Family History  Problem Relation Age of Onset  . Hypertension Mother   . Cancer Mother   . Heart attack Unknown   . Heart attack Unknown   . Heart attack Brother   . Colon cancer Neg Hx   . Stroke Neg Hx     Prior to Admission medications   Medication Sig Start Date End Date Taking? Authorizing Provider  acetaminophen (TYLENOL) 500 MG tablet Take 1,000 mg by mouth every 6 (six) hours as needed for mild pain or fever.   Yes [provider]  ALPRAZolam (XANAX) 0.25 MG tablet Take 1 tablet (0.25 mg total) by mouth daily as needed for anxiety. 05/28/17  Yes [name], MD  fluticasone [facility]) 50 MCG/ACT nasal spray Place 2 sprays into both nostrils daily. Allergies 08/01/14  Yes [name], MD  furosemide (LASIX) 40 MG tablet Take 1 tablet (40 mg total) by mouth 2 (two) times daily. 03/31/18  Yes Belva Crome, MD  glipiZIDE (GLUCOTROL) 5 MG tablet Take 2.5 mg 2 (two) times daily by mouth.   Yes [provider]  guaiFENesin-dextromethorphan (ROBITUSSIN DM) 100-10  MG/5ML syrup Take 5 mLs by mouth every 4 (four) hours as needed for cough. 10/15/16  Yes Lavina Hamman, MD  HYDROcodone-acetaminophen (NORCO) 7.5-325 MG tablet Take 1 tablet every 6 (six) hours as needed by mouth for moderate pain or severe pain. 04/22/17  Yes Dancy, Amanda, PA-C  ipratropium-albuterol (DUONEB) 0.5-2.5 (3) MG/3ML SOLN Take 3 mLs by nebulization 3 (three) times daily.  02/17/17  Yes [provider]  nitroGLYCERIN (NITROSTAT) 0.4 MG SL tablet Place 0.4 mg under the tongue every 5 (five) minutes as needed for chest pain. Reported on 12/12/2015   Yes [provider]  NON FORMULARY Place 2 L into the nose continuous.   Yes [provider]  Polyethyl Glycol-Propyl Glycol (SYSTANE) 0.4-0.3 % SOLN Apply 2 drops to eye 3 (three) times daily as needed (dry eyes).    Yes [provider]  pravastatin (PRAVACHOL) 20 MG tablet Take 60 mg by mouth at bedtime.    Yes [provider]  ranitidine (ZANTAC) 300 MG tablet Take 600 mg by mouth at bedtime.    Yes [provider]  senna (SENOKOT) 8.6 MG tablet Take 2 tablets by mouth daily.    Yes [provider]  spironolactone (ALDACTONE) 25 MG tablet Take 0.5 tablets (12.5 mg total) by mouth daily. 04/07/18 04/02/19 Yes Belva Crome, MD  valsartan (DIOVAN) 160 MG tablet Take 160 mg by mouth daily.   Yes [provider]  budesonide (PULMICORT) 0.25 MG/2ML nebulizer solution Take 2 mLs (0.25 mg total) by nebulization 2 (two) times daily. Patient not taking: Reported on 04/12/2018 12/06/17   Lauraine Rinne, NP  glucose blood (ONE TOUCH ULTRA TEST) test strip USE TO CHECK BLOOD SUGARS ONCE A DAY 10/28/17   [name], MD  Physical Exam: Vitals:   04/12/18 1411 04/12/18 1415 04/12/18 1500 04/12/18 1515  BP:  (!) 135/54 (!) 131/52 (!) 129/58  Pulse: 60 63  (!) 51  Resp: (!) 21 (!) 22 (!) 22 (!) 22  Temp:      TempSrc:      SpO2: 100% 100% 100% 100%  Weight:      Height:          . General:  Appears calm and comfortable and is in NAD . Eyes:  PERRL, EOMI, normal lids, iris . ENT:  grossly normal hearing, lips & tongue, mmm; appropriate dentition . Neck:  no LAD, masses or thyromegaly; no carotid bruits . Cardiovascular: irreg irreg. Tachy. 2/6 systolic murmur. [name] Respiratory:  Diffuse rhonchi with scattered wheeze. Sl increased WOB . Abdomen:  soft, NT, ND, NABS. LLQ colostomy bag . Back:   grossly normal alignment, no CVAT . Skin:  no rash or induration seen on limited exam . Musculoskeletal:  grossly normal tone BUE/BLE, good ROM, no bony abnormality or obvious joint deformity . Lower extremity:  No LE edema.  Limited foot exam with no ulcerations.  2+ distal pulses. [name] Psychiatric:  grossly normal mood and affect, speech fluent and appropriate, AOx3 . Neurologic:  CN 2-12 grossly intact, moves all extremities in coordinated fashion, sensation intact    Radiological Exams on Admission: Dg Chest Port 1 View  Result Date: 04/12/2018 CLINICAL DATA:  Chronic shortness of breath. EXAM: PORTABLE CHEST 1 VIEW COMPARISON:  11/23/2017 FINDINGS: The cardiac silhouette, mediastinal and hilar contours are normal and stable. Stable surgical changes from bypass surgery. Stable pacer wires. Chronic left basilar pleural thickening and parenchymal scarring. Stable emphysematous changes and areas of pulmonary scarring. No definite acute overlying pulmonary process. The bony thorax is intact. IMPRESSION: 1. Chronic lung changes with emphysema and pulmonary scarring. 2. Chronic left basilar pleural thickening and parenchymal scarring. 3. No acute overlying pulmonary findings. Electronically Signed   By: Marijo Sanes M.D.   On: 04/12/2018 12:16    EKG: Independently reviewed.    @1115 : Sinus or ectopic atrial tachycardia, rate 140 Ventricular premature complex Left anterior fascicular block Low voltage, precordial leads Consider anterior infarct  @1357  (after  metoprolol) A fib with rate 112 Left axis deviation Low voltage, precordial leads Consider anterior infarct   Labs on Admission: I have personally reviewed the available labs and imaging studies at the time of the admission.  Pertinent labs:  Na 139 K 3.7 CO2 26 Gluc 123 BUN 18 Creat 1.34 (up slightly from baseline of 1-1.2) LFTs WNL BNP 483 TnI 0.41 WBC 6.2 Hgb 10.3 (up from 9.3) Plt 199  Last HbA1C 01/11/2018: 6.6  TTE 05/19/16 - Left ventricle: The cavity size was normal. Wall thickness was   increased in a pattern of mild LVH. Systolic function was normal.   The estimated ejection fraction was in the range of 55% to 65%.   Wall motion was normal; there were no regional wall motion   abnormalities. Doppler parameters are consistent with abnormal   left ventricular relaxation (grade 1 diastolic dysfunction). - Aortic valve: Mildly calcified annulus. Mildly calcified   leaflets. Valve area (VTI): 1.9 cm^2. Valve area (Vmax): 1.88   cm^2. Valve area (Vmean): 1.71 cm^2.  Assessment/Plan Principal Problem:   NSTEMI (non-ST elevated myocardial infarction) ([place]) Active Problems:   Type 2 diabetes mellitus, controlled ([place])   Dyslipidemia   Hypertensive heart disease with CHF ([place])   Coronary atherosclerosis   COPD mixed type (  H[facility])   GERD   Obstructive sleep apnea   Chronic diastolic heart failure (H[facility])   Obesity (BMI 30-39.9)   Essential hypertension   Atrial fibrillation with RVR (H[facility])   Chest pain with troponinemia (TnI 0.4) -unclear if this is a true NSTEMI vs demand ischemia in the setting of AF/RVR -heparin gtt initiated in ED, continue -s/p metoprolol 10 mg IV x 1, with improvement in rate.  -diltiazem 60 mg po q8h, monitor HR and BP -trend troponin to peak -Aspirin 334m qd started -Imdur 15 mg po daily started -continue home pravastatin 653mqd -cards following  Atrial fibrillation, new onset, began a few hours after presentation to ED. Risk factors:  DM, obesity, HTN, CAD. CHA2DS2-VASc 4 (4.8% risk for stroke per year and 6.7% risk for stroke/tia/systemic embolism) -BP stable 110-140/60-90 -TTE done, awaiting results -TSH, T4 ordered -Po diltiazem 6034m6hrs -cont IV heparin gtt; will need to be transitioned to NOAC prior to discharge -PPM interrogation  COPD on 3L O2 at home -O2 at baseline but given increased wheezing and dyspnea will need neb Rx -duonebs prn -monitor  Diabetes Mellitus  -F/u HbA1C -SS -carb modified heart healthy diet  DVT prophylaxis: Heparin gtt  Code Status: Full Family Communication:   Disposition Plan:  Home once clinically improved Consults called: Cardiology  Admission status: Admit - It is my clinical opinion that admission to INPATIENT is reasonable and necessary because of the expectation that this patient will require hospital care that crosses at least 2 midnights to treat this condition based on the medical complexity of the problems presented.  Given the aforementioned information, the predictability of an adverse outcome is felt to be significant.     SalJanora Norlander [facility]  If note is complete, please contact covering daytime or nighttime physician. [email or web address] Password [email or web address]  04/12/2018, 3:30 PM

## 2018-04-12 NOTE — ED Triage Notes (Addendum)
Per [facility], pt arrives from [facility] facility for heart rate in the 140's, generalized weakness x3 days, shortness of breath and chest pain. Pt is always on 3 L [place] and always SOB, but more so this AM. Pt had chest pain for around 3 minutes took 324 ASA and 2 NTG prior to EMS arrival and is now pain free. Pt reports weakness going on for 3 days that is worse in the morning and gets better throughout the day. Pt received 400 of NS en route. Hx CHF. Colostomy present.

## 2018-04-12 NOTE — ED Notes (Signed)
Pacemaker interrogated by [facility]. Eric [name] rep. Per rep, pt in a fib since yesterday. Shawn, PA shown results.

## 2018-04-12 NOTE — Progress Notes (Signed)
ANTICOAGULATION CONSULT NOTE - Initial Consult  Pharmacy Consult for heparin Indication: NSTEMI  Patient Measurements: Height: 5\' 6"  (167.6 cm) Weight: 228 lb (103.4 kg) IBW/kg (Calculated) : 63.8 Heparin Dosing Weight: 87 kg  Vital Signs: Temp: 98.6 F (37 C) (11/05 1116) Temp Source: Oral (11/05 1116) BP: 116/84 (11/05 1145) Pulse Rate: 140 (11/05 1145)  Labs: Recent Labs    04/12/18 1135  HGB 10.3*  HCT 34.7*  PLT 199  CREATININE 1.34*    Assessment: 84 yo male admitted with SOB and tachycardia. Heparin is being started for rule out NSTEMI. H/h, plts wnl, SCr 1 > 1.3, eCrCl ~ 40 ml/min. He is not on any anticoagulants prior to admission.    Goal of Therapy:  Heparin level 0.3-0.7 units/ml Monitor platelets by anticoagulation protocol: Yes    Plan:  -Heparin bolus 4000 units x1 then 1050 units/hr -Daily HL, CBC -Check level in 8 hours   Harvel Quale 04/12/2018,12:54 PM

## 2018-04-12 NOTE — ED Notes (Signed)
RN attempted to interrogate pacemaker. Pt pacemaker not compatible with our transmitter. Local rep [phone] to come interrogate in 1-2 hours.

## 2018-04-12 NOTE — ED Notes (Addendum)
Cardiology at bedside.

## 2018-04-12 NOTE — ED Provider Notes (Signed)
[facility] EMERGENCY DEPARTMENT Provider Note   CSN: [id number] Arrival date & time: 04/12/18  1107     History   Chief Complaint Chief Complaint  Patient presents with  . Weakness  . Chest Pain  . Tachycardia  . Shortness of Breath    HPI Eric [name] is a 84 y.o. male.  HPI   Eric [name] is a 84 y.o. male, with a history of ischemic cardiomyopathy, DM, CAD, COPD,, presenting to the ED with shortness of breath and tachycardia for the last 2 days.  Patient states he began to feel weak and overall unwell, took his blood pressure at home and noted his pulse rate was in the 140s. EMS administered albuterol and patient voices improvement with this. Denies fever/chills, N/V/D, hematochezia/melena noted in his colostomy, urinary symptoms, chest pain, lower extremity edema, increased orthopnea, abdominal pain, or any other complaints.   Past Medical History:  Diagnosis Date  . ALLERGIC RHINITIS   . ANEMIA-NOS   . AORTIC SCLEROSIS   . Asthma   . CARDIOMYOPATHY, ISCHEMIC   . CAROTID BRUIT, RIGHT 02/27/2008  . Cataract    surgery  . CML (chronic myeloid leukemia) ([place]) 06/26/2015  . COPD   . CORONARY ARTERY DISEASE    a. s/p CABG in 1995 b. DES in 2008, 2009, and most recent in 2012 with DES to SVG-OM  . DIABETES MELLITUS-TYPE II    diet controlled  . Diastolic dysfunction, Grade 1 11/24/2014  . Diverticulitis of colon with perforation 11/22/2014  . Diverticulosis   . GERD   . HIATAL HERNIA   . Hx of echocardiogram    Echo (9/15):  Mild LVH, EF 50-55%, no RWMA, Gr 1 DD, MAC, mild LAE.  [name] HYPERLIPIDEMIA   . HYPERTENSION   . Hyponatremia 11/22/2014  . IBS (irritable bowel syndrome)   . LACTOSE INTOLERANCE   . OA (osteoarthritis)   . OBESITY   . On home oxygen therapy    "3L all the time" (10/12/2016)  . Partial small bowel obstruction ([place])   . PERIPHERAL VASCULAR DISEASE   . Primary hyperparathyroidism ([place])    Lab Results Component Value Date   PTH 150.7* 02/13/2013  CALCIUM 11.0* 02/13/2013  CAION 1.21 03/15/2008    . Prostate cancer ([place])    seed implants 2004  . SICK SINUS/ TACHY-BRADY SYNDROME 09/2007   s/p PPM st [employer]  . Sleep apnea   . SMALL BOWEL OBSTRUCTION 04/18/2009   Qualifier: History of  By: Asa Lente MD, Jannifer Rodney Symptomatic diverticulosis 01/18/2009   Qualifier: Diagnosis of  By: Shane Crutch, Amy S     Patient Active Problem List   Diagnosis Date Noted  . Erosion of penile prosthesis ([place]) 04/22/2017  . Bilateral pleural effusion 10/12/2016  . Chronic respiratory failure with hypoxia ([place]) 10/12/2016  . Goals of care, counseling/discussion 08/04/2016  . Anxiety state 06-23-2016  . Anemia in neoplastic disease 12/12/2015  . Acquired pancytopenia ([place]) 10/28/2015  . Tachy-brady syndrome ([place]) 07/10/2015  . Bilateral carotid artery stenosis 07/10/2015  . Essential hypertension 07/10/2015  . CML (chronic myeloid leukemia) ([place]) 06/26/2015  . Myeloproliferative neoplasm ([place]) 06/25/2015  . Back pain 12/10/2014  . Obesity (BMI 30-39.9) 12/08/2014  . Hartmann's pouch of intestine ([place]) 12/08/2014  . CAD (coronary artery disease) of artery bypass graft 11/28/2014  . Chronic diastolic heart failure ([place]) 11/24/2014  . GERD (gastroesophageal reflux disease) 03/27/2014  . Generalized weakness 02/08/2014  . Obstructive sleep apnea 04/11/2011  .  Type 2 diabetes mellitus, controlled ([place]) 01/18/2009  . Dyslipidemia 01/18/2009  . Hypertensive heart disease with CHF ([place]) 01/18/2009  . Coronary atherosclerosis 01/18/2009  . GERD 01/18/2009  . COPD mixed type ([place]) 12/27/2006    Past Surgical History:  Procedure Laterality Date  . BACK SURGERY    . Bilateral cataracts    . COLON RESECTION N/A 11/28/2014   Procedure: EXPLORATORY LAPAROTOMY, SIGMOID COLECTOMY WITH COLOSTOMY;  Surgeon: Jackolyn Confer, MD;  Location: [facility] OR[facility];  Service: General;  Laterality: N/A;  . COLON SURGERY    . COLONOSCOPY    . CORONARY  ARTERY BYPASS GRAFT    . ESOPHAGOGASTRODUODENOSCOPY  multiple  . FLEXIBLE SIGMOIDOSCOPY N/A 09/22/2013   Procedure: FLEXIBLE SIGMOIDOSCOPY;  Surgeon: Gatha Mayer, MD;  Location: [facility] END[facility];  Service: Endoscopy;  Laterality: N/A;  . INGUINAL HERNIA REPAIR Bilateral   . LUMBAR [place] SURGERY  12/2008  . PACEMAKER INSERTION     DDD/[employer]         Last interrogation 2/13  on chart     Pacemaker guideline order Dr Tamala Julian on chart  . Partial small bowel obstruction  2009  . PENILE PROSTHESIS PLACEMENT    . PTCA  2008, 2009, 2012   with DES  . REMOVAL OF PENILE PROSTHESIS N/A 04/22/2017   Procedure: EXPLANT OF MULTICOMPONENT PENILE PROSTHESIS;  Surgeon: Irine Seal, MD;  Location: [facility];  Service: Urology;  Laterality: N/A;  . TOTAL HIP ARTHROPLASTY  08/21/2011   Procedure: TOTAL HIP ARTHROPLASTY;  Surgeon: Johnn Hai, MD;  Location: [facility];  Service: Orthopedics;  Laterality: Right;        Home Medications    Prior to Admission medications   Medication Sig Start Date End Date Taking? Authorizing Provider  acetaminophen (TYLENOL) 500 MG tablet Take 1,000 mg by mouth every 6 (six) hours as needed for mild pain or fever.   Yes [provider]  ALPRAZolam (XANAX) 0.25 MG tablet Take 1 tablet (0.25 mg total) by mouth daily as needed for anxiety. 05/28/17  Yes [name], MD  fluticasone [facility]) 50 MCG/ACT nasal spray Place 2 sprays into both nostrils daily. Allergies 08/01/14  Yes [name], MD  furosemide (LASIX) 40 MG tablet Take 1 tablet (40 mg total) by mouth 2 (two) times daily. 03/31/18  Yes Belva Crome, MD  glipiZIDE (GLUCOTROL) 5 MG tablet Take 2.5 mg 2 (two) times daily by mouth.   Yes [provider]  guaiFENesin-dextromethorphan (ROBITUSSIN DM) 100-10 MG/5ML syrup Take 5 mLs by mouth every 4 (four) hours as needed for cough. 10/15/16  Yes Lavina Hamman, MD  HYDROcodone-acetaminophen (NORCO) 7.5-325 MG tablet Take 1 tablet  every 6 (six) hours as needed by mouth for moderate pain or severe pain. 04/22/17  Yes Dancy, Amanda, PA-C  ipratropium-albuterol (DUONEB) 0.5-2.5 (3) MG/3ML SOLN Take 3 mLs by nebulization 3 (three) times daily.  02/17/17  Yes [provider]  nitroGLYCERIN (NITROSTAT) 0.4 MG SL tablet Place 0.4 mg under the tongue every 5 (five) minutes as needed for chest pain. Reported on 12/12/2015   Yes [provider]  NON FORMULARY Place 2 L into the nose continuous.   Yes [provider]  Polyethyl Glycol-Propyl Glycol (SYSTANE) 0.4-0.3 % SOLN Apply 2 drops to eye 3 (three) times daily as needed (dry eyes).    Yes [provider]  pravastatin (PRAVACHOL) 20 MG tablet Take 60 mg by mouth at bedtime.    Yes [provider]  ranitidine (ZANTAC) 300 MG tablet Take 600 mg by mouth at bedtime.    Yes [provider]  senna (SENOKOT) 8.6 MG tablet Take 2 tablets by mouth daily.    Yes [provider]  spironolactone (ALDACTONE) 25 MG tablet Take 0.5 tablets (12.5 mg total) by mouth daily. 04/07/18 04/02/19 Yes Belva Crome, MD  valsartan (DIOVAN) 160 MG tablet Take 160 mg by mouth daily.   Yes [provider]  budesonide (PULMICORT) 0.25 MG/2ML nebulizer solution Take 2 mLs (0.25 mg total) by nebulization 2 (two) times daily. Patient not taking: Reported on 04/12/2018 12/06/17   Lauraine Rinne, NP  glucose blood (ONE TOUCH ULTRA TEST) test strip USE TO CHECK BLOOD SUGARS ONCE A DAY 10/28/17   [name], MD    Family History Family History  Problem Relation Age of Onset  . Hypertension Mother   . Cancer Mother   . Heart attack Unknown   . Heart attack Unknown   . Heart attack Brother   . Colon cancer Neg Hx   . Stroke Neg Hx     Social History Social History   Tobacco Use  . Smoking status: Former Smoker    Packs/day: 1.00    Years: 25.00    Pack years: 25.00    Types: Cigarettes    Last attempt to quit: 06/08/1994     Years since quitting: 23.8  . Smokeless tobacco: Never Used  Substance Use Topics  . Alcohol use: No    Alcohol/week: 0.0 standard drinks  . Drug use: No     Allergies   Actos [pioglitazone hydrochloride]; Buprenorphine hcl; Celebrex [celecoxib]; Demerol; Meperidine; Morphine and related; Ciprofloxacin; Metformin; and Zocor [simvastatin]   Review of Systems Review of Systems  Constitutional: Negative for chills, diaphoresis and fever.  Respiratory: Positive for shortness of breath.   Cardiovascular: Negative for chest pain and leg swelling.  All other systems reviewed and are negative.    Physical Exam Updated Vital Signs BP 124/73 (BP Location: Left Arm)   Pulse (!) 140   Temp 98.6 F (37 C) (Oral)   Resp (!) 22   Ht _0  (1.676 m)   Wt 103.4 kg   SpO2 100%   BMI 36.80 kg/m   Physical Exam  Constitutional: He appears well-developed and well-nourished. No distress.  HENT:  Head: Normocephalic and atraumatic.  Eyes: Conjunctivae are normal.  Neck: Neck supple.  Cardiovascular: Regular rhythm, normal heart sounds and intact distal pulses. Tachycardia present.  Pulmonary/Chest: Tachypnea noted. He has wheezes. He has rales.  Increased work of breathing with tachypnea.  SPO2 100% on patient's home supplemental O2 of 3 L/min.   Abdominal: Soft. There is no tenderness. There is no guarding.  Colostomy with brown stool  Musculoskeletal: He exhibits no edema.  Lymphadenopathy:    He has no cervical adenopathy.  Neurological: He is alert.  Skin: Skin is warm and dry. He is not diaphoretic.  Psychiatric: He has a normal mood and affect. His behavior is normal.  Nursing note and vitals reviewed.    ED Treatments / Results  Labs (all labs ordered are listed, but only abnormal results are displayed) Labs Reviewed  CBC WITH DIFFERENTIAL/PLATELET - Abnormal; Notable for the following components:      Result Value   RBC 3.45 (*)    Hemoglobin 10.3 (*)    HCT 34.7 (*)     MCV 100.6 (*)    MCHC 29.7 (*)    Lymphs Abs  0.4 (*)    All other components within normal limits  BRAIN NATRIURETIC PEPTIDE - Abnormal; Notable for the following components:   B Natriuretic Peptide 483.0 (*)    All other components within normal limits  COMPREHENSIVE METABOLIC PANEL - Abnormal; Notable for the following components:   Glucose, Bld 123 (*)    Creatinine, Ser 1.34 (*)    GFR calc non Af Amer 45 (*)    GFR calc Af Amer 52 (*)    All other components within normal limits  URINALYSIS, ROUTINE W REFLEX MICROSCOPIC - Abnormal; Notable for the following components:   Color, Urine STRAW (*)    Hgb urine dipstick SMALL (*)    All other components within normal limits  I-STAT TROPONIN, ED - Abnormal; Notable for the following components:   Troponin i, poc 0.41 (*)    All other components within normal limits  CBG MONITORING, ED - Abnormal; Notable for the following components:   Glucose-Capillary 119 (*)    All other components within normal limits  HEPARIN LEVEL (UNFRACTIONATED)    EKG EKG Interpretation  Date/Time:  Tuesday April 12 2018 11:15:50 EST Ventricular Rate:  140 PR Interval:    QRS Duration: 93 QT Interval:  312 QTC Calculation: 477 R Axis:   -86 Text Interpretation:  Sinus or ectopic atrial tachycardia Ventricular premature complex Left anterior fascicular block Low voltage, precordial leads Consider anterior infarct Confirmed by Fredia Sorrow [phone]) on 04/12/2018 11:52:42 AM    EKG Interpretation  Date/Time:  Tuesday April 12 2018 13:57:47 EST Ventricular Rate:  112 PR Interval:    QRS Duration: 93 QT Interval:  339 QTC Calculation: 463 R Axis:   -44 Text Interpretation:  Atrial fibrillation Left axis deviation Low voltage, precordial leads Consider anterior infarct Confirmed by Fredia Sorrow [phone]) on 04/12/2018 2:01:43 PM       Radiology Dg Chest Port 1 View  Result Date: 04/12/2018 CLINICAL DATA:  Chronic shortness of  breath. EXAM: PORTABLE CHEST 1 VIEW COMPARISON:  11/23/2017 FINDINGS: The cardiac silhouette, mediastinal and hilar contours are normal and stable. Stable surgical changes from bypass surgery. Stable pacer wires. Chronic left basilar pleural thickening and parenchymal scarring. Stable emphysematous changes and areas of pulmonary scarring. No definite acute overlying pulmonary process. The bony thorax is intact. IMPRESSION: 1. Chronic lung changes with emphysema and pulmonary scarring. 2. Chronic left basilar pleural thickening and parenchymal scarring. 3. No acute overlying pulmonary findings. Electronically Signed   By: Marijo Sanes M.D.   On: 04/12/2018 12:16    Procedures .Critical Care Performed by: Lorayne Bender, PA-C Authorized by: Lorayne Bender, PA-C   Critical care provider statement:    Critical care time (minutes):  35   Critical care time was exclusive of:  Separately billable procedures and treating other patients   Critical care was necessary to treat or prevent imminent or life-threatening deterioration of the following conditions:  Circulatory failure (NSTEMI)   Critical care was time spent personally by me on the following activities:  Development of treatment plan with patient or surrogate, discussions with consultants, evaluation of patient's response to treatment, examination of patient, obtaining history from patient or surrogate, ordering and performing treatments and interventions, ordering and review of radiographic studies, ordering and review of laboratory studies, pulse oximetry, re-evaluation of patient's condition and review of old charts   I assumed direction of critical care for this patient from another provider in my specialty: no     (including critical care time)  Medications Ordered in ED Medications  heparin ADULT infusion 100 units/mL (25000 units/228m sodium chloride 0.45%) (1,050 Units/hr Intravenous New Bag/Given 04/12/18 1348)  ipratropium-albuterol  (DUONEB) 0.5-2.5 (3) MG/3ML nebulizer solution 3 mL (has no administration in time range)  pravastatin (PRAVACHOL) tablet 60 mg (has no administration in time range)  aspirin chewable tablet 324 mg (has no administration in time range)  ipratropium-albuterol (DUONEB) 0.5-2.5 (3) MG/3ML nebulizer solution 3 mL (3 mLs Nebulization Given 04/12/18 1138)  metoprolol tartrate (LOPRESSOR) injection 10 mg (10 mg Intravenous Given 04/12/18 1315)  heparin bolus via infusion 4,000 Units (4,000 Units Intravenous Bolus from Bag 04/12/18 1348)   This patients CHA2DS2-VASc Score and unadjusted Ischemic Stroke Rate (% per year) is equal to 9.7 % stroke rate/year from a score of 6  Above score calculated as 1 point each if present [CHF, HTN, DM, Vascular=MI/PAD/Aortic Plaque, Age if 65-74, or Male] Above score calculated as 2 points each if present [Age > 75, or Stroke/TIA/TE]   Initial Impression / Assessment and Plan / ED Course  I have reviewed the triage vital signs and the nursing notes.  Pertinent labs & imaging results that were available during my care of the patient were reviewed by me and considered in my medical decision making (see chart for details).  Clinical Course as of Apr 12 1542  Tue Apr 12, 2018  1333 Was able to get a hold of Trish, CWater engineer   [SJ]  1535 Spoke with Dr. LVeva Holes hospitalist.  Agrees to admit the patient.   [SJ]    Clinical Course User Index [SJ] ,  C, PA-C   Patient presents with shortness of breath and tachycardia.  He is afebrile without leukocytosis.  No evidence of infection on chest x-ray or urine. Tachycardia was controlled with metoprolol.  Once rate was controlled, A. fib noted on EKG.  Patient voiced improvement after rate control and breathing treatments. Patient was noted to have positive troponin.  Treated as NSTEMI. Admitted for further management.  Findings and plan of care discussed with SFredia Sorrow MD. Dr. ZRogene Houstonpersonally  evaluated and examined this patient.   Vitals:   04/12/18 1411 04/12/18 1415 04/12/18 1500 04/12/18 1515  BP:  (!) 135/54 (!) 131/52 (!) 129/58  Pulse: 60 63  (!) 51  Resp: (!) 21 (!) 22 (!) 22 (!) 22  Temp:      TempSrc:      SpO2: 100% 100% 100% 100%  Weight:      Height:         Final Clinical Impressions(s) / ED Diagnoses   Final diagnoses:  Shortness of breath  New onset a-fib ([facility]    ED Discharge Orders    None       JLayla Maw11/05/19 1549    ZFredia Sorrow MD 04/13/18 [phone]

## 2018-04-12 NOTE — Progress Notes (Signed)
  Called by nursing staff.  Patient continue with AF with RVR with HRs in the 130-140 range. SBP soft 90-100.   Will start amiodarone gtt.  Glori Bickers, MD  8:26 PM

## 2018-04-12 NOTE — Progress Notes (Signed)
ANTICOAGULATION CONSULT NOTE - Initial Consult  Pharmacy Consult for heparin Indication: NSTEMI  Patient Measurements: Height: 5\' 6"  (167.6 cm) Weight: 228 lb (103.4 kg) IBW/kg (Calculated) : 63.8 Heparin Dosing Weight: 87 kg  Vital Signs: Temp: 97.7 F (36.5 C) (11/05 2006) Temp Source: Oral (11/05 2006) BP: 103/56 (11/05 2017) Pulse Rate: 141 (11/05 2017)  Labs: Recent Labs    04/12/18 1135 04/12/18 1830 04/12/18 2127  HGB 10.3*  --   --   HCT 34.7*  --   --   PLT 199  --   --   HEPARINUNFRC  --   --  0.26*  CREATININE 1.34*  --   --   TROPONINI  --  0.59*  --     Assessment: 84 yo male admitted with SOB and tachycardia. Heparin is being started for rule out NSTEMI. H/h, plts wnl, SCr 1 > 1.3, eCrCl ~ 40 ml/min. He is not on any anticoagulants prior to admission.   Heparin level 0.26 which is subtherapeutic  Goal of Therapy:  Heparin level 0.3-0.7 units/ml Monitor platelets by anticoagulation protocol: Yes    Plan:  - Increase Heparin infusion 1150 units/hr -Daily HL, CBC -Check level in 8 hours   Alanda Slim, PharmD, [facility] Clinical Pharmacist Please see AMION for all Pharmacists' Contact Phone Numbers 04/12/2018, 10:00 PM

## 2018-04-12 NOTE — ED Notes (Signed)
Heparin verified with Sherrine Maples ,RN

## 2018-04-13 DIAGNOSIS — J9611 Chronic respiratory failure with hypoxia: Secondary | ICD-10-CM

## 2018-04-13 DIAGNOSIS — K219 Gastro-esophageal reflux disease without esophagitis: Secondary | ICD-10-CM

## 2018-04-13 DIAGNOSIS — R778 Other specified abnormalities of plasma proteins: Secondary | ICD-10-CM

## 2018-04-13 DIAGNOSIS — R7989 Other specified abnormal findings of blood chemistry: Secondary | ICD-10-CM

## 2018-04-13 LAB — CBC
HCT: 29.8 % — ABNORMAL LOW (ref 39.0–52.0)
Hemoglobin: 9.5 g/dL — ABNORMAL LOW (ref 13.0–17.0)
MCH: 31.1 pg (ref 26.0–34.0)
MCHC: 31.9 g/dL (ref 30.0–36.0)
MCV: 97.7 fL (ref 80.0–100.0)
Platelets: 168 10*3/uL (ref 150–400)
RBC: 3.05 MIL/uL — ABNORMAL LOW (ref 4.22–5.81)
RDW: 13.4 % (ref 11.5–15.5)
WBC: 5.6 10*3/uL (ref 4.0–10.5)
nRBC: 0 % (ref 0.0–0.2)

## 2018-04-13 LAB — BASIC METABOLIC PANEL
Anion gap: 10 (ref 5–15)
BUN: 25 mg/dL — ABNORMAL HIGH (ref 8–23)
CO2: 25 mmol/L (ref 22–32)
Calcium: 9.1 mg/dL (ref 8.9–10.3)
Chloride: 103 mmol/L (ref 98–111)
Creatinine, Ser: 1.58 mg/dL — ABNORMAL HIGH (ref 0.61–1.24)
GFR calc Af Amer: 43 mL/min — ABNORMAL LOW (ref >60)
GFR calc non Af Amer: 37 mL/min — ABNORMAL LOW (ref >60)
Glucose, Bld: 123 mg/dL — ABNORMAL HIGH (ref 70–99)
Potassium: 3.5 mmol/L (ref 3.5–5.1)
Sodium: 138 mmol/L (ref 135–145)

## 2018-04-13 LAB — GLUCOSE, CAPILLARY
Glucose-Capillary: 180 mg/dL — ABNORMAL HIGH (ref 70–99)
Glucose-Capillary: 144 mg/dL — ABNORMAL HIGH (ref 70–99)
Glucose-Capillary: 138 mg/dL — ABNORMAL HIGH (ref 70–99)

## 2018-04-13 LAB — TROPONIN I
TROPONIN I: 0.39 ng/mL — AB (ref <0.03)
Troponin I: 0.64 ng/mL (ref <0.03)

## 2018-04-13 LAB — PROTIME-INR
INR: 1.11
Prothrombin Time: 14.2 seconds (ref 11.4–15.2)

## 2018-04-13 LAB — HEPARIN LEVEL (UNFRACTIONATED)
Heparin Unfractionated: 0.36 IU/mL (ref 0.30–0.70)
Heparin Unfractionated: 0.45 IU/mL (ref 0.30–0.70)

## 2018-04-13 MED ORDER — GUAIFENESIN ER 600 MG PO TB12
600.0000 mg | ORAL_TABLET | Freq: Two times a day (BID) | ORAL | Status: DC
  Administered 2018-04-13 – 2018-04-17 (×8): 600 mg via ORAL
  Filled 2018-04-13 (×8): qty 1

## 2018-04-13 MED ORDER — INSULIN ASPART 100 UNIT/ML ~~LOC~~ SOLN
0.0000 [IU] | Freq: Three times a day (TID) | SUBCUTANEOUS | Status: DC
  Administered 2018-04-13: 1 [IU] via SUBCUTANEOUS
  Administered 2018-04-13 – 2018-04-14 (×2): 2 [IU] via SUBCUTANEOUS
  Administered 2018-04-14 – 2018-04-16 (×4): 1 [IU] via SUBCUTANEOUS
  Administered 2018-04-17: 2 [IU] via SUBCUTANEOUS
  Administered 2018-04-18 – 2018-04-19 (×4): 1 [IU] via SUBCUTANEOUS

## 2018-04-13 MED ORDER — ALUM & MAG HYDROXIDE-SIMETH 200-200-20 MG/5ML PO SUSP
30.0000 mL | ORAL | Status: DC | PRN
  Administered 2018-04-13: 30 mL via ORAL
  Filled 2018-04-13: qty 30

## 2018-04-13 MED ORDER — GUAIFENESIN ER 600 MG PO TB12
1200.0000 mg | ORAL_TABLET | Freq: Two times a day (BID) | ORAL | Status: DC
  Administered 2018-04-13 (×2): 1200 mg via ORAL
  Filled 2018-04-13 (×2): qty 2

## 2018-04-13 MED ORDER — ASPIRIN 325 MG PO TABS
325.0000 mg | ORAL_TABLET | Freq: Every day | ORAL | Status: DC
  Administered 2018-04-13 – 2018-04-14 (×2): 325 mg via ORAL
  Filled 2018-04-13: qty 1

## 2018-04-13 MED ORDER — GUAIFENESIN-DM 100-10 MG/5ML PO SYRP
5.0000 mL | ORAL_SOLUTION | Freq: Three times a day (TID) | ORAL | Status: DC
  Administered 2018-04-13 – 2018-04-17 (×12): 5 mL via ORAL
  Filled 2018-04-13 (×13): qty 5

## 2018-04-13 MED ORDER — INSULIN ASPART 100 UNIT/ML ~~LOC~~ SOLN: 0.0000 [IU] | Freq: Every day | SUBCUTANEOUS | Status: DC

## 2018-04-13 MED ORDER — IPRATROPIUM-ALBUTEROL 0.5-2.5 (3) MG/3ML IN SOLN
3.0000 mL | Freq: Three times a day (TID) | RESPIRATORY_TRACT | Status: DC
  Administered 2018-04-13 – 2018-04-14 (×3): 3 mL via RESPIRATORY_TRACT
  Filled 2018-04-13 (×3): qty 3

## 2018-04-13 MED ORDER — ASPIRIN 325 MG PO TABS
ORAL_TABLET | ORAL | Status: AC
  Filled 2018-04-13: qty 1

## 2018-04-13 NOTE — Progress Notes (Signed)
ANTICOAGULATION CONSULT NOTE   Pharmacy Consult for Heparin Indication: NSTEMI  Patient Measurements: Height: 5\' 6"  (167.6 cm) Weight: 228 lb (103.4 kg) IBW/kg (Calculated) : 63.8 Heparin Dosing Weight: 87 kg  Vital Signs: Temp: 97.3 F (36.3 C) (11/06 0819) Temp Source: Oral (11/06 0819) BP: 116/69 (11/06 1216) Pulse Rate: 76 (11/06 1216)  Labs: Recent Labs    04/12/18 1135 04/12/18 1830 04/12/18 2127 04/12/18 2335 04/13/18 0245 04/13/18 0816 04/13/18 1128  HGB 10.3*  --   --   --  9.5*  --   --   HCT 34.7*  --   --   --  29.8*  --   --   PLT 199  --   --   --  168  --   --   LABPROT  --   --   --   --  14.2  --   --   INR  --   --   --   --  1.11  --   --   HEPARINUNFRC  --   --  0.26*  --  0.36  --  0.45  CREATININE 1.34*  --   --   --  1.58*  --   --   TROPONINI  --  0.59*  --  0.64*  --  0.39*  --     Assessment: 84 yo male admitted with SOB and tachycardia. Heparin is being started for rule out NSTEMI. Hgb 9.5 this AM. He is not on any anticoagulants prior to admission.   Heparin level remains therapeutic, on 1150 units/hr. Hgb 9.5, plt 168. Trop decreasing from peak at 0.6 to 0.39. No s/sx of bleeding or infusion issues   Goal of Therapy:  Heparin level 0.3-0.7 units/ml Monitor platelets by anticoagulation protocol: Yes    Plan:  -Cont heparin at 1150 units/hr -Monitor daily HL, CBC and s/sx of bleeding  Doylene Canard, PharmD Clinical Pharmacist  Pager: [phone] Phone: [phone]

## 2018-04-13 NOTE — Progress Notes (Addendum)
PROGRESS NOTE    Eric [name]   VXB:[id number]  DOB: [birth year]  DOA: 04/12/2018 PCP: [name], MD   Brief Narrative:  Eric [name] is a 84 y.o. male with medical history significant for CAD, Aortic sclerosis, ischemic cardiomyopathy, COPDon 3L at home, OSA, sick sinus tachy-brady syndrome s/p PPM, essential hypertension, CML, h/o prostate CA who presentedwith c/o dyspnea, tachycardia, chest pain. Pt endorses 2-3 days of increased fatigue, andgeneral malaisefor the past 3-4 days.He states that he has not been able walk to the bathroom despite previously being able to walk up to 100 yards. He denies change to his chronic cough, but does feel that he has been wheezing more than usual. The patient states that he has frequent chest pain that is usually relieved with zantac, but he felt that the chest pain this morning was worse and different quality than his usual episodes of GERD. He describes the chest pain as achy, substernal, up to 5/10 intensity, radiating across both shoulders and to his back. CP was relieved somewhat by 2 NTG. At home, pt's BP 160s/70sand his pulse was in 140s. In ED> ST vs atrial ectopic tachycardia, Twave inversions in v1 and avr. Troponin elevation to 0.41.Chest x-ray showing chronic emphysematous changes.After metoprolol, HR slowed to ~110 and EKG showed afib.    Subjective: Cough/ congestion present but not worse than usual. Lives in [facility] and has an active social life. Refuses to go to SNF. Takes Nebs and "cough" med together TID followed by 1/2 glass of water to cough up mucous. No chest pain or dyspnea, nausea or vomiting.    Assessment & Plan:   Principal Problem:   Atrial fibrillation with RVR  CHA2DS2-VASc score of 4  - now on Amiodarone as hypotension led to stopping Cardizem - also on Heparin infusion  Active Problems:    Elevated troponin/   Coronary atherosclerosis  0.59  0.64  - per cardiology note, they  are considering a cath -  Aspirin, Pravastatin, Imdur, heparin infusion    Hypertensive heart disease with CHF  - HFrEF-  EF 45-50% - cont home dose of lasix  - Spironolactone & Diovan on hold  CKD 3 - close to baseline with is about 1.0-1.3    COPD mixed type   Chronic respiratory failure with hypoxia   - on 3 L O2 at baseline - cont Pulmicort, Duonebs Q6prn, Mucinex    GERD - pepcid    Obstructive sleep apnea - CPAP ordered- pressure of 12     Obesity (BMI 30-39.9) Body mass index is 36.8 kg/m.    CML (chronic myeloid leukemia)     Anemia - last anemia panel showed AOCD      Type 2 diabetes mellitus, controlled - hold Glipizide, place on low dose ISS    Dyslipidemia - cont Pravastatin - LDL 66, TG 158  DVT prophylaxis: Heparin infusion Code Status: Full code Family Communication:  Disposition Plan: f/u cardiology work up Consultants:   cardiology Procedures:   2 D ECHO Study Conclusions  - Left ventricle: The cavity size was normal. Wall thickness was   increased in a pattern of mild LVH. Systolic function was mildly   reduced. The estimated ejection fraction was in the range of 45%   to 50%. Diffuse hypokinesis. - Aortic valve: Valve mobility was restricted. - Aortic root: The aortic root was mildly dilated. - Mitral valve: Calcified annulus. Antimicrobials:  Anti-infectives (From admission, onward)   None  Objective: Vitals:   04/13/18 0047 04/13/18 0400 04/13/18 0422 04/13/18 0500  BP: 103/67 (!) 107/57 (!) 107/57 (!) 95/58  Pulse: (!) 59 67 70 70  Resp: (!) 25 16 15 15   Temp: 98.2 F (36.8 C)  97.8 F (36.6 C)   TempSrc: Oral  Oral   SpO2: 97% 98% 99% 99%  Weight:      Height:        Intake/Output Summary (Last 24 hours) at 04/13/2018 0721 Last data filed at 04/12/2018 2300 Gross per 24 hour  Intake 106.89 ml  Output 700 ml  Net -593.11 ml   Filed Weights   04/12/18 1116  Weight: 103.4 kg    Examination: General  exam: Appears comfortable  HEENT: PERRLA, oral mucosa moist, no sclera icterus or thrush Respiratory system: coughing, congested- 100% on 3 L Cardiovascular system: S1 & S2 heard, RRR.  HR in 80s Gastrointestinal system: Abdomen soft, non-tender, nondistended. Normal bowel sounds. Colostomy with dark soft, partly liquid stool Central nervous system: Alert and oriented. No focal neurological deficits. Extremities: No cyanosis, clubbing or edema Skin: No rashes or ulcers Psychiatry:  Mood & affect appropriate.     Data Reviewed: I have personally reviewed following labs and imaging studies  CBC: Recent Labs  Lab 04/12/18 1135 04/13/18 0245  WBC 6.2 5.6  NEUTROABS 4.8  --   HGB 10.3* 9.5*  HCT 34.7* 29.8*  MCV 100.6* 97.7  PLT 199 361   Basic Metabolic Panel: Recent Labs  Lab 04/12/18 1135 04/13/18 0245  NA 139 138  K 3.7 3.5  CL 104 103  CO2 26 25  GLUCOSE 123* 123*  BUN 18 25*  CREATININE 1.34* 1.58*  CALCIUM 9.3 9.1   GFR: Estimated Creatinine Clearance: 35.7 mL/min (A) (by C-G formula based on SCr of 1.58 mg/dL (H)). Liver Function Tests: Recent Labs  Lab 04/12/18 1135  AST 22  ALT 11  ALKPHOS 60  BILITOT 0.7  PROT 6.5  ALBUMIN 3.7   No results for input(s): LIPASE, AMYLASE in the last 168 hours. No results for input(s): AMMONIA in the last 168 hours. Coagulation Profile: Recent Labs  Lab 04/13/18 0245  INR 1.11   Cardiac Enzymes: Recent Labs  Lab 04/12/18 1830 04/12/18 2335  TROPONINI 0.59* 0.64*   BNP (last 3 results) Recent Labs    10/19/17 1448  PROBNP 473   HbA1C: No results for input(s): HGBA1C in the last 72 hours. CBG: Recent Labs  Lab 04/12/18 1134 04/12/18 1707  GLUCAP 119* 113*   Lipid Profile: Recent Labs    04/12/18 1830  CHOL 123  HDL 25*  LDLCALC 66  TRIG 158*  CHOLHDL 4.9   Thyroid Function Tests: No results for input(s): TSH, T4TOTAL, FREET4, T3FREE, THYROIDAB in the last 72 hours. Anemia Panel: No  results for input(s): VITAMINB12, FOLATE, FERRITIN, TIBC, IRON, RETICCTPCT in the last 72 hours. Urine analysis:    Component Value Date/Time   COLORURINE STRAW (A) 04/12/2018 [place] 04/12/2018 1157   LABSPEC 1.006 04/12/2018 1157   PHURINE 6.0 04/12/2018 1157   GLUCOSEU NEGATIVE 04/12/2018 1157   GLUCOSEU NEGATIVE 12/04/2016 1013   HGBUR SMALL (A) 04/12/2018 1157   BILIRUBINUR NEGATIVE 04/12/2018 1157   KETONESUR NEGATIVE 04/12/2018 1157   PROTEINUR NEGATIVE 04/12/2018 1157   UROBILINOGEN 0.2 12/04/2016 1013   NITRITE NEGATIVE 04/12/2018 1157   LEUKOCYTESUR NEGATIVE 04/12/2018 1157   Sepsis Labs: @LABRCNTIP (procalcitonin:4,lacticidven:4) )No results found for this or any previous visit (from the past  240 hour(s)).       Radiology Studies: Dg Chest Port 1 View  Result Date: 04/12/2018 CLINICAL DATA:  Chronic shortness of breath. EXAM: PORTABLE CHEST 1 VIEW COMPARISON:  11/23/2017 FINDINGS: The cardiac silhouette, mediastinal and hilar contours are normal and stable. Stable surgical changes from bypass surgery. Stable pacer wires. Chronic left basilar pleural thickening and parenchymal scarring. Stable emphysematous changes and areas of pulmonary scarring. No definite acute overlying pulmonary process. The bony thorax is intact. IMPRESSION: 1. Chronic lung changes with emphysema and pulmonary scarring. 2. Chronic left basilar pleural thickening and parenchymal scarring. 3. No acute overlying pulmonary findings. Electronically Signed   By: Marijo Sanes M.D.   On: 04/12/2018 12:16      Scheduled Meds: . aspirin  324 mg Oral Once  . budesonide  0.25 mg Nebulization BID  . famotidine  40 mg Oral QHS  . fluticasone  2 spray Each Nare Daily  . furosemide  40 mg Oral BID  . guaiFENesin  1,200 mg Oral BID  . ipratropium-albuterol  3 mL Nebulization TID  . isosorbide mononitrate  15 mg Oral Daily  . pravastatin  60 mg Oral q1800  . senna  2 tablet Oral Daily  .  spironolactone  12.5 mg Oral Daily   Continuous Infusions: . amiodarone 30 mg/hr (04/13/18 0231)  . heparin 1,150 Units/hr (04/12/18 2219)     LOS: 1 day    Time spent in minutes: 40    Debbe Odea, MD [facility] Pager: [email or web address] Password [email or web address] 04/13/2018, 7:21 AM

## 2018-04-13 NOTE — Consult Note (Signed)
[facility] Nurse ostomy follow up Patient has a LUQ colostomy producing soft brown stool.  He also has a large hernia at the stoma site.  He uses a 2 piece, 2 and 3/4 inch flat pouching system by [employer].  I have placed a care order providing the Kellie Simmering numbers for these items. Stoma type/location: LUQ Stomal assessment/size: Appliance not removed, stoma not measured Peristomal assessment: No assessed Treatment options for stomal/peristomal skin: Barrier ring if needed. Kellie Simmering # [phone] Output:  Soft brown Ostomy pouching: 1pc. flat Thank you for the consult.  Discussed plan of care with the patient and bedside nurse.  [place] nurse will not follow at this time.  Please re-consult the [place] team if needed.  Val Riles, RN, MSN, CWOCN, CNS-BC, pager [phone]

## 2018-04-13 NOTE — Progress Notes (Signed)
ANTICOAGULATION CONSULT NOTE   Pharmacy Consult for Heparin Indication: NSTEMI  Patient Measurements: Height: 5\' 6"  (167.6 cm) Weight: 228 lb (103.4 kg) IBW/kg (Calculated) : 63.8 Heparin Dosing Weight: 87 kg  Vital Signs: Temp: 98.2 F (36.8 C) (11/06 0047) Temp Source: Oral (11/06 0047) BP: 103/67 (11/06 0047) Pulse Rate: 59 (11/06 0047)  Labs: Recent Labs    04/12/18 1135 04/12/18 1830 04/12/18 2127 04/12/18 2335 04/13/18 0245  HGB 10.3*  --   --   --  9.5*  HCT 34.7*  --   --   --  29.8*  PLT 199  --   --   --  168  HEPARINUNFRC  --   --  0.26*  --  0.36  CREATININE 1.34*  --   --   --   --   TROPONINI  --  0.59*  --  0.64*  --     Assessment: 84 yo male admitted with SOB and tachycardia. Heparin is being started for rule out NSTEMI. Hgb 9.5 this AM. He is not on any anticoagulants prior to admission.   Heparin level therapeutic x 1 after rate increase  Goal of Therapy:  Heparin level 0.3-0.7 units/ml Monitor platelets by anticoagulation protocol: Yes    Plan:  -Cont heparin at 1150 units/hr -Confirmatory heparin level at [place], PharmD, [place] Pharmacist Phone: [phone]

## 2018-04-13 NOTE — Plan of Care (Signed)

## 2018-04-13 NOTE — Progress Notes (Signed)
Preformed Ostomy care. Patient tolerated well. Stoma is clean. Will continue to monitor.

## 2018-04-13 NOTE — Evaluation (Signed)
Physical Therapy Evaluation Patient Details Name: Eric [name] MRN: [id number] DOB: Nov 22, 1926 Today's Date: 04/13/2018   History of Present Illness  Pt is an 84 y/o male admitted secondary to tachycardia and SOB. Found to have a fib with RVR. Also with elevated troponins and awaiting cardiac cath, however, RN cleared for participation. PMH includes COPD on home O2, CAD, HTN, DM, R THA, and s/p colostomy.   Clinical Impression  Pt admitted secondary to problem above with deficits below. Pt anxious about mobility and tearful at times. Reports he wants to go home with hospice and stated he thinks he's going to die. Notified RN about possible palliative consult if appropriate. Pt requiring min to min guard A for mobility using RW. Limited secondary to fatigue. HR ranging high 90s and to mid 110s. Feel most appropriate d/c location is SNF, however, pt will likely refuse. Will continue to follow acutely to maximize functional mobility independence.     Follow Up Recommendations SNF;Supervision/Assistance - 24 hour(will likely refuse; wants to go with hospice )    Equipment Recommendations  None recommended by PT    Recommendations for Other Services OT consult     Precautions / Restrictions Precautions Precautions: Fall Restrictions Weight Bearing Restrictions: No      Mobility  Bed Mobility Overal bed mobility: Needs Assistance Bed Mobility: Supine to Sit     Supine to sit: Min assist     General bed mobility comments: Min A for trunk elevation. Increased time required.   Transfers Overall transfer level: Needs assistance Equipment used: Rolling walker (2 wheeled) Transfers: Sit to/from Stand Sit to Stand: Min assist         General transfer comment: Min A for lift assist and steadying. Demonstrated safe hand placement.   Ambulation/Gait Ambulation/Gait assistance: Min guard;Min assist Gait Distance (Feet): 50 Feet Assistive device: Rolling walker (2 wheeled) Gait  Pattern/deviations: Step-through pattern;Decreased stride length;Trunk flexed Gait velocity: Decreased    General Gait Details: Slow, shaky gait. Pt asking multiple times what his oxygen sats were; Oxygen sats ranging from 92-98% on 3L during gait. Pt reporting dizziness, however, would not sit down for rest; BP WFL. Distance limited secondary to fatigue.   Stairs            Wheelchair Mobility    Modified Rankin (Stroke Patients Only)       Balance Overall balance assessment: Needs assistance Sitting-balance support: No upper extremity supported;Feet supported Sitting balance-Leahy Scale: Fair     Standing balance support: Bilateral upper extremity supported;During functional activity Standing balance-Leahy Scale: Poor Standing balance comment: Reliant on BUE support.                              Pertinent Vitals/Pain Pain Assessment: No/denies pain    Home Living Family/patient expects to be discharged to:: Private residence Living Arrangements: Alone Available Help at Discharge: Friend(s);Available PRN/intermittently Type of Home: Independent living facility Home Access: Level entry     Home Layout: One level Home Equipment: Walker - 2 wheels;Cane - single point;Shower seat - built in      Prior Function Level of Independence: Independent with assistive device(s)         Comments: Reports using cane vs RW for ambulation.      Hand Dominance        Extremity/Trunk Assessment   Upper Extremity Assessment Upper Extremity Assessment: Defer to OT evaluation    Lower Extremity Assessment Lower  Extremity Assessment: Generalized weakness    Cervical / Trunk Assessment Cervical / Trunk Assessment: Kyphotic  Communication   Communication: No difficulties  Cognition Arousal/Alertness: Awake/alert Behavior During Therapy: Anxious Overall Cognitive Status: No family/caregiver present to determine baseline cognitive functioning                                  General Comments: Pt very anxious and tearful during session. Reporting multiple times he just wants to go home and die with hospice.       General Comments General comments (skin integrity, edema, etc.): Educated pt about SNF, however, pt reporting "I'd rather die than go to SNF. Will likely refuse. Stated "I want to go home with hospice." Notified RN about possible palliative consult if appropriate.     Exercises     Assessment/Plan    PT Assessment Patient needs continued PT services  PT Problem List Decreased strength;Decreased balance;Decreased activity tolerance;Decreased mobility;Decreased knowledge of use of DME;Decreased knowledge of precautions;Pain;Cardiopulmonary status limiting activity       PT Treatment Interventions Gait training;DME instruction;Functional mobility training;Therapeutic activities;Therapeutic exercise;Balance training;Patient/family education    PT Goals (Current goals can be found in the Care Plan section)  Acute Rehab PT Goals Patient Stated Goal: to go home and have hospice come   PT Goal Formulation: With patient Time For Goal Achievement: 04/27/18 Potential to Achieve Goals: Fair    Frequency Min 3X/week   Barriers to discharge Decreased caregiver support      Co-evaluation               AM-PAC PT "6 Clicks" Daily Activity  Outcome Measure Difficulty turning over in bed (including adjusting bedclothes, sheets and blankets)?: A Little Difficulty moving from lying on back to sitting on the side of the bed? : Unable Difficulty sitting down on and standing up from a chair with arms (e.g., wheelchair, bedside commode, etc,.)?: Unable Help needed moving to and from a bed to chair (including a wheelchair)?: A Little Help needed walking in hospital room?: A Little Help needed climbing 3-5 steps with a railing? : A Lot 6 Click Score: 13    End of Session Equipment Utilized During Treatment: Gait  belt Activity Tolerance: Other (comment);Patient limited by fatigue(anxiety ) Patient left: in chair;with nursing/sitter in room;with call bell/phone within reach Nurse Communication: Mobility status;Other (comment)(possible need for palliative ) PT Visit Diagnosis: Unsteadiness on feet (R26.81);Muscle weakness (generalized) (M62.81)    Time: 9179-1505 PT Time Calculation (min) (ACUTE ONLY): 32 min   Charges:   PT Evaluation $PT Eval Moderate Complexity: 1 Mod PT Treatments $Gait Training: 8-22 mins        Leighton Ruff, PT, DPT  Acute Rehabilitation Services  Pager: [phone] Office: [phone]   Rudean Hitt 04/13/2018, 6:25 PM

## 2018-04-13 NOTE — Progress Notes (Addendum)
Progress Note  Patient Name: Eric [name] Date of Encounter: 04/13/2018  Primary Cardiologist: Dr. Tamala Julian  Subjective   The patient states that he is doing well and that his cough has decreased. Patient had rapid heart rate ranging 130-140s and SBP 90-100 and later yesterday evening and was started on amiodarone drip.  Inpatient Medications    Scheduled Meds: . aspirin  324 mg Oral Once  . budesonide  0.25 mg Nebulization BID  . famotidine  40 mg Oral QHS  . fluticasone  2 spray Each Nare Daily  . furosemide  40 mg Oral BID  . guaiFENesin  1,200 mg Oral BID  . ipratropium-albuterol  3 mL Nebulization TID  . isosorbide mononitrate  15 mg Oral Daily  . pravastatin  60 mg Oral q1800  . senna  2 tablet Oral Daily  . spironolactone  12.5 mg Oral Daily   Continuous Infusions: . amiodarone 30 mg/hr (04/13/18 0231)  . heparin 1,150 Units/hr (04/12/18 2219)   PRN Meds: acetaminophen, ALPRAZolam, alum & mag hydroxide-simeth, guaiFENesin-dextromethorphan, HYDROcodone-acetaminophen, ipratropium-albuterol, nitroGLYCERIN, ondansetron (ZOFRAN) IV, polyvinyl alcohol   Vital Signs    Vitals:   04/13/18 0047 04/13/18 0400 04/13/18 0422 04/13/18 0500  BP: 103/67 (!) 107/57 (!) 107/57 (!) 95/58  Pulse: (!) 59 67 70 70  Resp: (!) 25 16 15 15   Temp: 98.2 F (36.8 C)  97.8 F (36.6 C)   TempSrc: Oral  Oral   SpO2: 97% 98% 99% 99%  Weight:      Height:        Intake/Output Summary (Last 24 hours) at 04/13/2018 0740 Last data filed at 04/12/2018 2300 Gross per 24 hour  Intake 106.89 ml  Output 700 ml  Net -593.11 ml    I/O since admission:   Filed Weights   04/12/18 1116  Weight: 103.4 kg    Telemetry    Atrial fibrillation- Personally Reviewed  ECG    ECG (independently read by me):  Physical Exam    BP (!) 95/58   Pulse 70   Temp 97.8 F (36.6 C) (Oral)   Resp 15   Ht 5\' 6"  (1.676 m)   Wt 103.4 kg   SpO2 99%   BMI 36.80 kg/m    Physical Exam    Constitutional: He appears well-developed and well-nourished. No distress.  HENT:  Head: Normocephalic and atraumatic.  Eyes: Conjunctivae are normal.  Cardiovascular: Normal rate. An irregularly irregular rhythm present.  No murmur heard. Respiratory: Effort normal and breath sounds normal. No respiratory distress. He has no wheezes.  Sparse rhonchi, on 3L via [place]  GI: Soft. Bowel sounds are normal. He exhibits no distension. There is no tenderness.  Musculoskeletal: He exhibits no edema.  Neurological: He is alert.  Skin: He is not diaphoretic. No erythema.  Psychiatric: He has a normal mood and affect. His behavior is normal. Judgment and thought content normal.    Labs    Chemistry Recent Labs  Lab 04/12/18 1135 04/13/18 0245  NA 139 138  K 3.7 3.5  CL 104 103  CO2 26 25  GLUCOSE 123* 123*  BUN 18 25*  CREATININE 1.34* 1.58*  CALCIUM 9.3 9.1  PROT 6.5  --   ALBUMIN 3.7  --   AST 22  --   ALT 11  --   ALKPHOS 60  --   BILITOT 0.7  --   GFRNONAA 45* 37*  GFRAA 52* 43*  ANIONGAP 9 10     Hematology Recent  Labs  Lab 04/12/18 1135 04/13/18 0245  WBC 6.2 5.6  RBC 3.45* 3.05*  HGB 10.3* 9.5*  HCT 34.7* 29.8*  MCV 100.6* 97.7  MCH 29.9 31.1  MCHC 29.7* 31.9  RDW 13.4 13.4  PLT 199 168    Cardiac Enzymes Recent Labs  Lab 04/12/18 1830 04/12/18 2335  TROPONINI 0.59* 0.64*    Recent Labs  Lab 04/12/18 1157  TROPIPOC 0.41*     BNP Recent Labs  Lab 04/12/18 1135  BNP 483.0*     DDimer No results for input(s): DDIMER in the last 168 hours.   Lipid Panel     Component Value Date/Time   CHOL 123 04/12/2018 1830   TRIG 158 (H) 04/12/2018 1830   TRIG 70 01/06/2010   HDL 25 (L) 04/12/2018 1830   CHOLHDL 4.9 04/12/2018 1830   VLDL 32 04/12/2018 1830   LDLCALC 66 04/12/2018 1830     Radiology    Dg Chest Port 1 View  Result Date: 04/12/2018 CLINICAL DATA:  Chronic shortness of breath. EXAM: PORTABLE CHEST 1 VIEW COMPARISON:  11/23/2017  FINDINGS: The cardiac silhouette, mediastinal and hilar contours are normal and stable. Stable surgical changes from bypass surgery. Stable pacer wires. Chronic left basilar pleural thickening and parenchymal scarring. Stable emphysematous changes and areas of pulmonary scarring. No definite acute overlying pulmonary process. The bony thorax is intact. IMPRESSION: 1. Chronic lung changes with emphysema and pulmonary scarring. 2. Chronic left basilar pleural thickening and parenchymal scarring. 3. No acute overlying pulmonary findings. Electronically Signed   By: Marijo Sanes M.D.   On: 04/12/2018 12:16    Cardiac Studies   TTE (04/12/18): LV EF: 45% -   50%  ------------------------------------------------------------------- History:   PMH:  Elevated Troponin Evaluation.  Coronary artery disease.  Chronic obstructive pulmonary disease.  Risk factors: former smoker. ICM. sick sinus syndrome. Hypertension. Diabetes mellitus. Dyslipidemia.  ------------------------------------------------------------------- Study Conclusions  - Left ventricle: The cavity size was normal. Wall thickness was   increased in a pattern of mild LVH. Systolic function was mildly   reduced. The estimated ejection fraction was in the range of 45%   to 50%. Diffuse hypokinesis. - Aortic valve: Valve mobility was restricted. - Aortic root: The aortic root was mildly dilated. - Mitral valve: Calcified annulus.  Impressions:  - Technically difficult; LV function mildly reduced (EF 45); mild   LVH; calcified aortic valve not well interrogated but no AS by   doppler; mildly dilated aortic root.   Patient Profile     84 y.o. male with CAD status post CABG, sick sinus syndrome, essential hypertension, CML presenting with shortness of breath and recurrent chest pain.  Found in new onset atrial fibrillation on admission along with troponin elevation 0.4.  Assessment & Plan    Chest pain with troponemia-ischemic  vs demand  Patient does not report any new chest pain since admission however his troponin continues to rise.  Troponin will be followed to peak.  TTE done yesterday 04/12/2018 showed LVEF 45 to 50%, mildly reduced systolic function, mild LVH, diffuse hypokinesis. [employer] rep was able to interrogate pacemaker which showed that the patient has been in A. fib since yesterday.  Concern for possible ischemic process causing new onset atrial fibrillation.  Once patient is more stable plan to do cardiac catheterization.  -Trend troponin to peak -Continue IV heparin -Aspirin 325mg  qd -Started Imdur 30 mg daily -Nitroglycerin 0.4 mg sublingual every 5 minutes as needed -Discontinued diltiazem  -continue home pravastatin 60mg   qd  Atrial fibrillation  Patient is currently in atrial fibrillation. CHA2DS2-VASc score of 4 placing 4.8% risk for stroke per year and 6.7% risk for stroke/tia/systemic embolism.  Due to increased heart rate and low blood pressures started on amiodarone.   -IV amiodarone 150 mg -Hold diltiazem due to blood pressures being soft   HFrEF Echo done yesterday showed EF 45 to 50% which is decreased from 55 to 65% in 2017.  -Lasix 40 mg twice daily  Essential hypertension The patient's blood pressure has been ranging 90-100/50-60s.  -Discontinue home spironolactone 12.5 mg daily due to soft blood pressures  COPD The patient has copd mixed type for which he is being followe by Dr. Annamaria Boots. Chronically on 3L oxygen at home.   -Duonebs q6hrs prn -Budesonide 0.25 mg twice daily -Flonase -Mucinex 1200 mg twice daily  Diabetes Mellitus  Blood glucose ranging 110-120 since admission. Will monitor   Lars Mage, MD Internal Medicine PGY2 BDHDI:[phone] 04/13/2018, 7:40 AM   Patient seen and examined. Agree with assessment and plan.  Patient developed atrial fibrillation with RVR last evening leading to initiation of intravenous amiodarone.  Cardizem has been held  with low blood pressure.  Patient has COPD and is on oxygen at home.  Hopefully will convert to sinus rhythm in the short-term and with lung disease may not be a candidate for long-term amiodarone treatment.  On exam today he continues to have diffuse rhonchi which have improved from yesterday.  There is no audible wheezing today.  Rhythm is irregularly irregular with rate now controlled.  BP remains on the low side with range of upper 20S -138 systolic.  Recommend decrease aspirin to 81 mg.  Patient will ultimately need systemic anticoagulation.  Will discuss with Dr. Tamala Julian in light of his chest pain prior to presentation which has resolved concerning potential future ischemic evaluation.  We will add low-dose nitrates and titrate as blood pressure allows.  Creatinine today is slightly increased to 1.58.   Troy Sine, MD, [facility] 04/13/2018 1:33 PM

## 2018-04-13 NOTE — Progress Notes (Signed)
Patient has been verbalizing that he would like to go home with hospice and "Just die". Informed patient that he is doing better and should be able to return to home. Patient is very focused on his vital signs and asking what they are and if he is "about to die". Educated patient. Vital signs are stable even with walking in the hallway. Will continue to monitor and provide support.

## 2018-04-14 DIAGNOSIS — R7989 Other specified abnormal findings of blood chemistry: Secondary | ICD-10-CM

## 2018-04-14 LAB — GLUCOSE, CAPILLARY
Glucose-Capillary: 135 mg/dL — ABNORMAL HIGH (ref 70–99)
Glucose-Capillary: 161 mg/dL — ABNORMAL HIGH (ref 70–99)
Glucose-Capillary: 117 mg/dL — ABNORMAL HIGH (ref 70–99)
Glucose-Capillary: 133 mg/dL — ABNORMAL HIGH (ref 70–99)

## 2018-04-14 LAB — CBC
HCT: 30.5 % — ABNORMAL LOW (ref 39.0–52.0)
Hemoglobin: 9.5 g/dL — ABNORMAL LOW (ref 13.0–17.0)
MCH: 30.8 pg (ref 26.0–34.0)
MCHC: 31.1 g/dL (ref 30.0–36.0)
MCV: 99 fL (ref 80.0–100.0)
Platelets: 168 10*3/uL (ref 150–400)
RBC: 3.08 MIL/uL — ABNORMAL LOW (ref 4.22–5.81)
RDW: 13.5 % (ref 11.5–15.5)
WBC: 7 10*3/uL (ref 4.0–10.5)
nRBC: 0 % (ref 0.0–0.2)

## 2018-04-14 LAB — BASIC METABOLIC PANEL
ANION GAP: 11 (ref 5–15)
BUN: 26 mg/dL — ABNORMAL HIGH (ref 8–23)
CHLORIDE: 100 mmol/L (ref 98–111)
CO2: 26 mmol/L (ref 22–32)
Calcium: 9.3 mg/dL (ref 8.9–10.3)
Creatinine, Ser: 1.63 mg/dL — ABNORMAL HIGH (ref 0.61–1.24)
GFR, EST AFRICAN AMERICAN: 41 mL/min — AB (ref >60)
GFR, EST NON AFRICAN AMERICAN: 36 mL/min — AB (ref >60)
Glucose, Bld: 136 mg/dL — ABNORMAL HIGH (ref 70–99)
Potassium: 3.9 mmol/L (ref 3.5–5.1)
SODIUM: 137 mmol/L (ref 135–145)

## 2018-04-14 LAB — MRSA PCR SCREENING: MRSA by PCR: NEGATIVE

## 2018-04-14 LAB — HEPARIN LEVEL (UNFRACTIONATED): Heparin Unfractionated: 0.3 IU/mL (ref 0.30–0.70)

## 2018-04-14 LAB — PROTIME-INR
INR: 1.1
Prothrombin Time: 14.1 seconds (ref 11.4–15.2)

## 2018-04-14 MED ORDER — ASPIRIN EC 81 MG PO TBEC
81.0000 mg | DELAYED_RELEASE_TABLET | Freq: Every day | ORAL | Status: DC
  Administered 2018-04-15 – 2018-04-20 (×6): 81 mg via ORAL
  Filled 2018-04-14 (×6): qty 1

## 2018-04-14 MED ORDER — ASPIRIN EC 81 MG PO TBEC: 81.0000 mg | DELAYED_RELEASE_TABLET | Freq: Every day | ORAL | Status: DC

## 2018-04-14 MED ORDER — APIXABAN 2.5 MG PO TABS
2.5000 mg | ORAL_TABLET | Freq: Two times a day (BID) | ORAL | Status: DC
  Administered 2018-04-14 – 2018-04-20 (×13): 2.5 mg via ORAL
  Filled 2018-04-14 (×13): qty 1

## 2018-04-14 MED ORDER — DILTIAZEM HCL 60 MG PO TABS
60.0000 mg | ORAL_TABLET | Freq: Three times a day (TID) | ORAL | Status: AC
  Administered 2018-04-14 – 2018-04-16 (×9): 60 mg via ORAL
  Filled 2018-04-14 (×9): qty 1

## 2018-04-14 MED ORDER — IPRATROPIUM BROMIDE 0.02 % IN SOLN
0.5000 mg | Freq: Three times a day (TID) | RESPIRATORY_TRACT | Status: DC
  Administered 2018-04-14 (×2): 0.5 mg via RESPIRATORY_TRACT
  Filled 2018-04-14 (×2): qty 2.5

## 2018-04-14 MED ORDER — LEVALBUTEROL HCL 0.63 MG/3ML IN NEBU
0.6300 mg | INHALATION_SOLUTION | Freq: Three times a day (TID) | RESPIRATORY_TRACT | Status: DC
  Administered 2018-04-14 (×2): 0.63 mg via RESPIRATORY_TRACT
  Filled 2018-04-14 (×2): qty 3

## 2018-04-14 MED ORDER — IPRATROPIUM BROMIDE 0.02 % IN SOLN
0.5000 mg | Freq: Three times a day (TID) | RESPIRATORY_TRACT | Status: DC
  Administered 2018-04-15 – 2018-04-20 (×17): 0.5 mg via RESPIRATORY_TRACT
  Filled 2018-04-14 (×17): qty 2.5

## 2018-04-14 MED ORDER — LEVALBUTEROL HCL 0.63 MG/3ML IN NEBU
0.6300 mg | INHALATION_SOLUTION | Freq: Three times a day (TID) | RESPIRATORY_TRACT | Status: DC
  Administered 2018-04-15 – 2018-04-20 (×16): 0.63 mg via RESPIRATORY_TRACT
  Filled 2018-04-14 (×17): qty 3

## 2018-04-14 MED ORDER — AMIODARONE HCL 200 MG PO TABS
200.0000 mg | ORAL_TABLET | Freq: Two times a day (BID) | ORAL | Status: DC
  Administered 2018-04-14 – 2018-04-20 (×13): 200 mg via ORAL
  Filled 2018-04-14 (×13): qty 1

## 2018-04-14 NOTE — Progress Notes (Addendum)
ANTICOAGULATION CONSULT NOTE   Pharmacy Consult for Heparin Indication: NSTEMI  Patient Measurements: Height: 5\' 6"  (167.6 cm) Weight: 228 lb (103.4 kg) IBW/kg (Calculated) : 63.8 Heparin Dosing Weight: 87 kg  Vital Signs: Temp: 97.3 F (36.3 C) (11/07 0300) Temp Source: Oral (11/07 0300) BP: 118/67 (11/07 0300) Pulse Rate: 122 (11/07 0300)  Labs: Recent Labs    04/12/18 1135 04/12/18 1830  04/12/18 2335 04/13/18 0245 04/13/18 0816 04/13/18 1128 04/14/18 0238  HGB 10.3*  --   --   --  9.5*  --   --  9.5*  HCT 34.7*  --   --   --  29.8*  --   --  30.5*  PLT 199  --   --   --  168  --   --  168  LABPROT  --   --   --   --  14.2  --   --  14.1  INR  --   --   --   --  1.11  --   --  1.10  HEPARINUNFRC  --   --    < >  --  0.36  --  0.45 0.30  CREATININE 1.34*  --   --   --  1.58*  --   --  1.63*  TROPONINI  --  0.59*  --  0.64*  --  0.39*  --   --    < > = values in this interval not displayed.    Assessment: 84 yo male admitted with SOB and tachycardia. Heparin is being started for rule out NSTEMI. Hgb 9.5 this AM. He is not on any anticoagulants prior to admission.   Heparin level remains therapeutic, on 1200 units/hr. Hgb 9.5, plt 168. Trop decreasing from peak at 0.6 to 0.39. No s/sx of bleeding or infusion issues   Goal of Therapy:  Heparin level 0.3-0.7 units/ml Monitor platelets by anticoagulation protocol: Yes   Plan:  -Cont heparin at 1200 units/hr -Monitor daily HL, CBC and s/sx of bleeding  Doylene Canard, PharmD Clinical Pharmacist  Pager: [phone] Phone: [phone]  ADDENDUM Plan to transition to apixaban for atrial fibrillation. Will discontinue heparin infusion and start apixaban 2.5 mg twice daily (age>80, weight>60 kg, Scr>1.5). Monito Scr trend, CBC, and for s/sx of bleeding.  Doylene Canard, PharmD Clinical Pharmacist

## 2018-04-14 NOTE — Plan of Care (Signed)

## 2018-04-14 NOTE — Care Management Note (Addendum)
Case Management Note  Patient Details  Name: GUSTAV KNUEPPEL MRN: [id number] Date of Birth: Jun 16, 1926  Subjective/Objective:                    Action/Plan:  PTA independent from home alone. Pt has colostomy and informed CM that he managed it independently at home.   Pt is on oxygen 2 liters continious supplied by [facility] - pt has portable oxygen tank for transport but unfortunately it is at home and pt doesn't have support system to retreive it at discharge - pt may need transport home at discharge - CSW consulted.  Pt informed CM that he is interested in discharging home with [facility] - CM contacted [facility] and made referral.  Attending gave CM verbal referral for home with hospice.  Pt informed CM that he is able to care for himself at home and has all the equipment that he needs in the home.   Expected Discharge Date:                  Expected Discharge Plan:  Home w [facility]  In-House Referral:  Clinical Social Work  Discharge planning Services  CM Consult  Post Acute Care Choice:    Choice offered to:     DME Arranged:    DME Agency:     HH Arranged:    HH Agency:     Status of Service:     If discussed at [employer], dates discussed:    Additional Comments: 04/14/2018  [facility] to follow up with CM to inform if pt is accepted into program post assessment.  [facility] may not pay for Eliquis - resource to follow up with CM.   CM informed pt of copay as documented in Epic - pt can not afford copay.  CM to follow up with attending once determination is made by hospice with coverage.   [name], RN 04/14/2018, 2:42 PM

## 2018-04-14 NOTE — Progress Notes (Signed)
Patient requesting to go home with hospice. Will plan to discuss his need for palliative care consult to MD for today.

## 2018-04-14 NOTE — Consult Note (Signed)
            [facility] CM Primary Care Navigator  04/14/2018  Eric [name] December 26, 1926 [id number]   Wenttosee patientat the bedsideto identify possible discharge needsbut hospice staff is with patient in the room at the moment.  PerMD note,patient presentedwithcomplaints of  dyspnea, tachycardia, chest pain, increasedfatigue, andgeneral malaise for the past few days.Patient had voiced interest in discharging home with [facility] ([facility] [place]). He was with hospice last year and would like to go back on it now.  Per Inpatient CM, anticipated discharge plan is Home with Hospice Care.  Nofurtheridentifiable [facility] Care Management needsat this point.   For additional questions please contact:  Edwena Felty A. Apostolos Blagg, BSN, RN-BC [facility] PRIMARY CARE Navigator Cell: [phone]

## 2018-04-14 NOTE — Progress Notes (Addendum)
Progress Note  Patient Name: Eric [name] Date of Encounter: 04/14/2018  Primary Cardiologist: Dr. Tamala Julian  Subjective   Was seen resting comfortably in his bed this morning. He denied having any new chest pain. Nursing staff mentioned that Eric [name] has been telling them that he would like to get home hospice services. He mentioned to [place] that he was anxious about his heartbeat not normalizing yet. He mentioned that he would be okay with [place] adding new medication today and would wait to see if it helps.  Inpatient Medications    Scheduled Meds: . aspirin  325 mg Oral Daily  . budesonide  0.25 mg Nebulization BID  . famotidine  40 mg Oral QHS  . fluticasone  2 spray Each Nare Daily  . furosemide  40 mg Oral BID  . guaiFENesin  600 mg Oral BID  . guaiFENesin-dextromethorphan  5 mL Oral TID  . insulin aspart  0-5 Units Subcutaneous QHS  . insulin aspart  0-9 Units Subcutaneous TID WC  . ipratropium-albuterol  3 mL Nebulization TID  . isosorbide mononitrate  15 mg Oral Daily  . pravastatin  60 mg Oral q1800  . senna  2 tablet Oral Daily   Continuous Infusions: . amiodarone 30.06 mg/hr (04/13/18 2000)  . heparin 1,150 Units/hr (04/13/18 2000)   PRN Meds: acetaminophen, alum & mag hydroxide-simeth, HYDROcodone-acetaminophen, nitroGLYCERIN, ondansetron (ZOFRAN) IV, polyvinyl alcohol   Vital Signs    Vitals:   04/13/18 2000 04/13/18 2233 04/13/18 2300 04/14/18 0300  BP: (!) 139/122 (!) 122/52 (!) 87/62 118/67  Pulse: 98 (!) 105 (!) 107 (!) 122  Resp: 17 (!) 21 18 (!) 26  Temp:   98.6 F (37 C) (!) 97.3 F (36.3 C)  TempSrc:   Oral Oral  SpO2: 93% 100% 100% 97%  Weight:      Height:        Intake/Output Summary (Last 24 hours) at 04/14/2018 0729 Last data filed at 04/14/2018 0412 Gross per 24 hour  Intake 1806.63 ml  Output 750 ml  Net 1056.63 ml    I/O since admission:   Filed Weights   04/12/18 1116  Weight: 103.4 kg    Telemetry    Atrial fibrillation-  Personally Reviewed  ECG    ECG (independently read by me):pending  Physical Exam    BP 118/67 (BP Location: Right Arm)   Pulse (!) 122   Temp (!) 97.3 F (36.3 C) (Oral)   Resp (!) 26   Ht 5\' 6"  (1.676 m)   Wt 103.4 kg   SpO2 97%   BMI 36.80 kg/m   Physical Exam  Constitutional: He appears well-developed and well-nourished. No distress.  HENT:  Head: Normocephalic and atraumatic.  Eyes: Conjunctivae are normal.  Cardiovascular: Normal rate. An irregularly irregular rhythm present.  Respiratory: Effort normal and breath sounds normal. No respiratory distress. He has no wheezes.  Diffuse rhonchi. On 3L via [place]  GI: Soft. Bowel sounds are normal. He exhibits no distension. There is no tenderness.  Musculoskeletal: He exhibits no edema.  Neurological: He is alert.  Skin: He is not diaphoretic. No erythema.  Psychiatric: He has a normal mood and affect. His behavior is normal. Judgment and thought content normal.    Labs    Chemistry Recent Labs  Lab 04/12/18 1135 04/13/18 0245 04/14/18 0238  NA 139 138 137  K 3.7 3.5 3.9  CL 104 103 100  CO2 26 25 26   GLUCOSE 123* 123* 136*  BUN  18 25* 26*  CREATININE 1.34* 1.58* 1.63*  CALCIUM 9.3 9.1 9.3  PROT 6.5  --   --   ALBUMIN 3.7  --   --   AST 22  --   --   ALT 11  --   --   ALKPHOS 60  --   --   BILITOT 0.7  --   --   GFRNONAA 45* 37* 36*  GFRAA 52* 43* 41*  ANIONGAP 9 10 11      Hematology Recent Labs  Lab 04/12/18 1135 04/13/18 0245 04/14/18 0238  WBC 6.2 5.6 7.0  RBC 3.45* 3.05* 3.08*  HGB 10.3* 9.5* 9.5*  HCT 34.7* 29.8* 30.5*  MCV 100.6* 97.7 99.0  MCH 29.9 31.1 30.8  MCHC 29.7* 31.9 31.1  RDW 13.4 13.4 13.5  PLT 199 168 168    Cardiac Enzymes Recent Labs  Lab 04/12/18 1830 04/12/18 2335 04/13/18 0816  TROPONINI 0.59* 0.64* 0.39*    Recent Labs  Lab 04/12/18 1157  TROPIPOC 0.41*     BNP Recent Labs  Lab 04/12/18 1135  BNP 483.0*     DDimer No results for input(s): DDIMER in  the last 168 hours.   Lipid Panel     Component Value Date/Time   CHOL 123 04/12/2018 1830   TRIG 158 (H) 04/12/2018 1830   TRIG 70 01/06/2010   HDL 25 (L) 04/12/2018 1830   CHOLHDL 4.9 04/12/2018 1830   VLDL 32 04/12/2018 1830   LDLCALC 66 04/12/2018 1830     Radiology    Dg Chest Port 1 View  Result Date: 04/12/2018 CLINICAL DATA:  Chronic shortness of breath. EXAM: PORTABLE CHEST 1 VIEW COMPARISON:  11/23/2017 FINDINGS: The cardiac silhouette, mediastinal and hilar contours are normal and stable. Stable surgical changes from bypass surgery. Stable pacer wires. Chronic left basilar pleural thickening and parenchymal scarring. Stable emphysematous changes and areas of pulmonary scarring. No definite acute overlying pulmonary process. The bony thorax is intact. IMPRESSION: 1. Chronic lung changes with emphysema and pulmonary scarring. 2. Chronic left basilar pleural thickening and parenchymal scarring. 3. No acute overlying pulmonary findings. Electronically Signed   By: Marijo Sanes M.D.   On: 04/12/2018 12:16    Cardiac Studies   TTE (04/12/18): LV EF: 45% - 50%  ------------------------------------------------------------------- History: PMH: Elevated Troponin Evaluation. Coronary artery disease. Chronic obstructive pulmonary disease. Risk factors: former smoker. ICM. sick sinus syndrome. Hypertension. Diabetes mellitus. Dyslipidemia.  ------------------------------------------------------------------- Study Conclusions  - Left ventricle: The cavity size was normal. Wall thickness was increased in a pattern of mild LVH. Systolic function was mildly reduced. The estimated ejection fraction was in the range of 45% to 50%. Diffuse hypokinesis. - Aortic valve: Valve mobility was restricted. - Aortic root: The aortic root was mildly dilated. - Mitral valve: Calcified annulus.  Impressions:  - Technically difficult; LV function mildly reduced (EF 45);  mild LVH; calcified aortic valve not well interrogated but no AS by doppler; mildly dilated aortic root.  Patient Profile     84 y.o. male with CAD status post CABG, sick sinus syndrome, essential hypertension, CML presenting with shortness of breath and recurrent chest pain.  Found in new onset atrial fibrillation on admission along with troponin elevation 0.4.   Assessment & Plan    Chest pain with troponemia-ischemic vs demand Troponin peaked at 0.64 and is likely due to demand ischemia.   -Discontinued IV heparin -Started eliquis -Changed IV amiodarone to po amiodarone 200mg  bid -Aspirin 325mg  qd changed to 81mg  -  Continue Imdur 30 mg daily -Nitroglycerin 0.4 mg sublingual every 5 minutes as needed -continue home pravastatin 60mg  qd  Atrial fibrillation Patient continues to be in atrial fibrillation. CHA2DS2-VASc score of 4.   -o amiodarone  -po diltiazem -Eliquis  HFrEF Echo done yesterday showed EF 45 to 50% which is decreased from 55 to 65% in 2017. Put out 781ml over past 24 hrs.   -Record daily weights and I/O -Lasix 40 mg twice daily  Essential hypertension The patient's blood pressure has been ranging 80-140/50-60s  -Discontinue home spironolactone 12.5 mg daily due to soft blood pressures  COPD The patient has copd mixed type for which he is being followe by Dr. Annamaria Boots. Chronically on 3L oxygen at home.   -Duonebsq6hrs prn -Budesonide 0.25 mg twice daily -Flonase -Mucinex 1200 mg twice daily  Diabetes Mellitus Blood glucose in 130s. Stable, will monitor    Lars Mage, MD Internal Medicine PGY2 POLID:[phone] 04/14/2018, 7:29 AM    Patient seen and examined. Agree with assessment and plan.  No recurrent chest pain.  Breathing better.  Patient remains in atrial fibrillation with ventricular rate at 100-115.  We will transition from IV amiodarone to initial oral 400 mg twice daily therapy.  BP 136/76.  We will also add low-dose  Cardizem at 30 mg every 8 hours.  With AF we will transition to Eliquis 5 mg twice a day from heparin.  He is hard of hearing today and may need new batteries for his hearing aids.  JVD approximately 7 to 8 cm.  Rhonchi improved on lung exam.  Rhythm is irregularly irregular with ventricular rate at approximately 110.  Abdomen is soft nontender.  No significant lower extremity edema.  Chest x-ray shows chronic scarring.  He is on home O2.   Troy Sine, MD, [facility] 04/14/2018 9:33 AM

## 2018-04-14 NOTE — Progress Notes (Signed)
PROGRESS NOTE    JAYKOB MINICHIELLO   GHW:[id number]  DOB: [birth year]  DOA: 04/12/2018 PCP: [name], MD   Brief Narrative:  Eric [name] is a 84 y.o. male with medical history significant for CAD, Aortic sclerosis, ischemic cardiomyopathy, COPDon 3L at home, OSA, sick sinus tachy-brady syndrome s/p PPM, essential hypertension, CML, h/o prostate CA who presentedwith c/o dyspnea, tachycardia, chest pain. Pt endorses 2-3 days of increased fatigue, andgeneral malaisefor the past 3-4 days.He states that he has not been able walk to the bathroom despite previously being able to walk up to 100 yards. He denies change to his chronic cough, but does feel that he has been wheezing more than usual. The patient states that he has frequent chest pain that is usually relieved with zantac, but he felt that the chest pain this morning was worse and different quality than his usual episodes of GERD. He describes the chest pain as achy, substernal, up to 5/10 intensity, radiating across both shoulders and to his back. CP was relieved somewhat by 2 NTG. At home, pt's BP 160s/70sand his pulse was in 140s. In ED> ST vs atrial ectopic tachycardia, Twave inversions in v1 and avr. Troponin elevation to 0.41.Chest x-ray showing chronic emphysematous changes.After metoprolol, HR slowed to ~110 and EKG showed afib.    Subjective: Cough/ congestion much better today. He is anxious and this is manifesting as nausea at the current time.  Would like outpt hospice- see discussion below.     Assessment & Plan:   Principal Problem:   Atrial fibrillation with RVR  - CHA2DS2-VASc score of 4  - now on Amiodarone as hypotension led to stopping Cardizem - also on Heparin infusion - 11/7> changed to oral Amio,, Eliquis, baby ASA rather than 325, oral Cardizem short acting - HR still going up as high at 120 while sitting still and therefore will keep in SDU again   Active Problems:    Elevated  troponin/   Coronary atherosclerosis  0.59  0.64  - likely demand ischemia -  Aspirin, Pravastatin, Imdur     Hypertensive heart disease with CHF  - HFrEF-  EF 45-50%- diffuse hypokinesis - cont home dose of lasix  - Spironolactone & Diovan on hold  CKD 3 -   baseline is about 1.0-1.3 - Cr up to 1.63 today  Anxiety - does not want to take Xanax which he has tried in the past- admits to nausea from it- QTc too high for anti-emetics    COPD mixed type   Chronic respiratory failure with hypoxia   - on 3 L O2 which is his baseline - cont Pulmicort,  Mucinex- change Duonebs to Xopenex and Atrovent    GERD - pepcid    Obstructive sleep apnea - CPAP ordered- pressure of 12     Obesity (BMI 30-39.9) Body mass index is 36.8 kg/m.    CML (chronic myeloid leukemia)     Anemia - last anemia panel showed AOCD      Type 2 diabetes mellitus, controlled - hold Glipizide, place on low dose ISS    Dyslipidemia - cont Pravastatin - LDL 66, TG 158  Disposition: he was on hospice last year and would like to go back on it now. He states he has 3 qualifying diagnoses including COPD, CHF and leukemia I think this is reasonable. Have asked case manager to set up outpt hospice f/u at [facility]. He has declined to go to SNF since the beginning of  his hospitalization.   DVT prophylaxis: Heparin infusion Code Status: Full code Family Communication:  Disposition Plan: d/c when HR improves- ? 1-2 days Consultants:   cardiology Procedures:   2 D ECHO Study Conclusions  - Left ventricle: The cavity size was normal. Wall thickness was   increased in a pattern of mild LVH. Systolic function was mildly   reduced. The estimated ejection fraction was in the range of 45%   to 50%. Diffuse hypokinesis. - Aortic valve: Valve mobility was restricted. - Aortic root: The aortic root was mildly dilated. - Mitral valve: Calcified annulus. Antimicrobials:  Anti-infectives (From  admission, onward)   None      Objective: Vitals:   04/14/18 0300 04/14/18 0734 04/14/18 0800 04/14/18 1141  BP: 118/67  (!) 137/49 113/69  Pulse: (!) 122  96 (!) 102  Resp: (!) 26  14 (!) 24  Temp: (!) 97.3 F (36.3 C)  97.8 F (36.6 C) 98.4 F (36.9 C)  TempSrc: Oral  Oral Oral  SpO2: 97% 100% 96% 96%  Weight:      Height:        Intake/Output Summary (Last 24 hours) at 04/14/2018 1244 Last data filed at 04/14/2018 1100 Gross per 24 hour  Intake 1059.69 ml  Output 900 ml  Net 159.69 ml   Filed Weights   04/12/18 1116  Weight: 103.4 kg    Examination: General exam: Appears comfortable  HEENT: PERRLA, oral mucosa moist, no sclera icterus or thrush Respiratory system: very faint wheeze in RU lung field- Respiratory effort normal. Cardiovascular system: S1 & S2 heard,  No murmurs IIRR- HR 90-120s Gastrointestinal system: Abdomen soft, non-tender, nondistended. Normal bowel sound. No organomegaly Central nervous system: Alert and oriented. No focal neurological deficits. Extremities: No cyanosis, clubbing or edema Skin: No rashes or ulcers Psychiatry:  Mood & affect appropriate although he states he feels anxious    Data Reviewed: I have personally reviewed following labs and imaging studies  CBC: Recent Labs  Lab 04/12/18 1135 04/13/18 0245 04/14/18 0238  WBC 6.2 5.6 7.0  NEUTROABS 4.8  --   --   HGB 10.3* 9.5* 9.5*  HCT 34.7* 29.8* 30.5*  MCV 100.6* 97.7 99.0  PLT 199 168 785   Basic Metabolic Panel: Recent Labs  Lab 04/12/18 1135 04/13/18 0245 04/14/18 0238  NA 139 138 137  K 3.7 3.5 3.9  CL 104 103 100  CO2 26 25 26   GLUCOSE 123* 123* 136*  BUN 18 25* 26*  CREATININE 1.34* 1.58* 1.63*  CALCIUM 9.3 9.1 9.3   GFR: Estimated Creatinine Clearance: 34.6 mL/min (A) (by C-G formula based on SCr of 1.63 mg/dL (H)). Liver Function Tests: Recent Labs  Lab 04/12/18 1135  AST 22  ALT 11  ALKPHOS 60  BILITOT 0.7  PROT 6.5  ALBUMIN 3.7   No  results for input(s): LIPASE, AMYLASE in the last 168 hours. No results for input(s): AMMONIA in the last 168 hours. Coagulation Profile: Recent Labs  Lab 04/13/18 0245 04/14/18 0238  INR 1.11 1.10   Cardiac Enzymes: Recent Labs  Lab 04/12/18 1830 04/12/18 2335 04/13/18 0816  TROPONINI 0.59* 0.64* 0.39*   BNP (last 3 results) Recent Labs    10/19/17 1448  PROBNP 473   HbA1C: No results for input(s): HGBA1C in the last 72 hours. CBG: Recent Labs  Lab 04/13/18 1146 04/13/18 1722 04/13/18 2119 04/14/18 0802 04/14/18 1221  GLUCAP 180* 144* 138* 135* 161*   Lipid Profile: Recent Labs  04/12/18 1830  CHOL 123  HDL 25*  LDLCALC 66  TRIG 158*  CHOLHDL 4.9   Thyroid Function Tests: No results for input(s): TSH, T4TOTAL, FREET4, T3FREE, THYROIDAB in the last 72 hours. Anemia Panel: No results for input(s): VITAMINB12, FOLATE, FERRITIN, TIBC, IRON, RETICCTPCT in the last 72 hours. Urine analysis:    Component Value Date/Time   COLORURINE STRAW (A) 04/12/2018 [place] 04/12/2018 1157   LABSPEC 1.006 04/12/2018 1157   PHURINE 6.0 04/12/2018 1157   GLUCOSEU NEGATIVE 04/12/2018 1157   GLUCOSEU NEGATIVE 12/04/2016 1013   HGBUR SMALL (A) 04/12/2018 1157   BILIRUBINUR NEGATIVE 04/12/2018 1157   KETONESUR NEGATIVE 04/12/2018 1157   PROTEINUR NEGATIVE 04/12/2018 1157   UROBILINOGEN 0.2 12/04/2016 1013   NITRITE NEGATIVE 04/12/2018 1157   LEUKOCYTESUR NEGATIVE 04/12/2018 1157   Sepsis Labs: @LABRCNTIP (procalcitonin:4,lacticidven:4) )No results found for this or any previous visit (from the past 240 hour(s)).       Radiology Studies: No results found.    Scheduled Meds: . amiodarone  200 mg Oral BID  . apixaban  2.5 mg Oral BID  . [START ON 04/15/2018] aspirin EC  81 mg Oral Daily  . budesonide  0.25 mg Nebulization BID  . diltiazem  60 mg Oral Q8H  . famotidine  40 mg Oral QHS  . fluticasone  2 spray Each Nare Daily  . furosemide  40  mg Oral BID  . guaiFENesin  600 mg Oral BID  . guaiFENesin-dextromethorphan  5 mL Oral TID  . insulin aspart  0-5 Units Subcutaneous QHS  . insulin aspart  0-9 Units Subcutaneous TID WC  . ipratropium-albuterol  3 mL Nebulization TID  . isosorbide mononitrate  15 mg Oral Daily  . pravastatin  60 mg Oral q1800  . senna  2 tablet Oral Daily   Continuous Infusions:    LOS: 2 days    Time spent in minutes: 30    Debbe Odea, MD [facility] Pager: [email or web address] Password [facility] 04/14/2018, 12:44 PM

## 2018-04-14 NOTE — Care Management (Signed)
#  5.  S/W  [facility] @ [facility] # [phone]  1. ELIQUIS  2.5 MG   BID COVER- YES CO-PAY- 100 % OF TOTAL COST TIER- 3 DRUG PRIOR APPROVAL- NO  2. ELIQUIS  5 MG BID COVER- YES CO-PAY- 100 %  OF TOTAL COST TIER- 3 DRUG PRIOR APPROVAL- NO  DEDUCTIBLE: NOT MET  PREFERRED PHARMACY : YES  - -- [facility]

## 2018-04-14 NOTE — Evaluation (Signed)
Occupational Therapy Evaluation Patient Details Name: Eric [name] MRN: [id number] DOB: 1927-04-12 Today's Date: 04/14/2018    History of Present Illness Pt is an 84 y/o male admitted secondary to tachycardia and SOB. Found to have a fib with RVR. Also with elevated troponins and awaiting cardiac cath, however, RN cleared for participation. PMH includes COPD on home O2, CAD, HTN, DM, R THA, and s/p colostomy.    Clinical Impression   Pt ambulates with either at cane or RW and is modified independent in self care and most housekeeping chores. He drives. Pt anxious and nauseous during session. Difficulty staying on topic during discussion. Pt is currently functioning at a min assist level and presents with decreased activity tolerance, generalized weakness and impaired standing balance. Recommend ST rehab in SNF prior to return home to his ILF, but pt prefers to go home with hospice care. Will follow acutely.    Follow Up Recommendations  SNF(pt likely to refuse, wants home with hospice)    Equipment Recommendations  None recommended by OT    Recommendations for Other Services       Precautions / Restrictions Precautions Precautions: Fall Restrictions Weight Bearing Restrictions: No      Mobility Bed Mobility               General bed mobility comments: pt received in chair  Transfers Overall transfer level: Needs assistance Equipment used: Rolling walker (2 wheeled) Transfers: Sit to/from [facility] Sit to Stand: Min assist Stand pivot transfers: Min assist       General transfer comment: cues for hand placement, min assist to rise and steady    Balance Overall balance assessment: Needs assistance   Sitting balance-Leahy Scale: Fair     Standing balance support: Bilateral upper extremity supported;During functional activity Standing balance-Leahy Scale: Poor Standing balance comment: Reliant on BUE support.                             ADL either performed or assessed with clinical judgement   ADL Overall ADL's : Needs assistance/impaired Eating/Feeding: Independent;Sitting   Grooming: Wash/dry face;Sitting;Set up   Upper Body Bathing: Set up;Sitting   Lower Body Bathing: Minimal assistance;Sit to/from stand   Upper Body Dressing : Set up;Sitting   Lower Body Dressing: Minimal assistance;Sit to/from stand Lower Body Dressing Details (indicate cue type and reason): can don socks Toilet Transfer: Minimal assistance;Stand-pivot;RW Toilet Transfer Details (indicate cue type and reason): simulated to chair Toileting- Clothing Manipulation and Hygiene: Minimal assistance;Sit to/from stand         General ADL Comments: Pt with colostomy.     Vision Patient Visual Report: No change from baseline       Perception     Praxis      Pertinent Vitals/Pain Pain Assessment: No/denies pain     Hand Dominance Right   Extremity/Trunk Assessment Upper Extremity Assessment Upper Extremity Assessment: Generalized weakness   Lower Extremity Assessment Lower Extremity Assessment: Defer to PT evaluation   Cervical / Trunk Assessment Cervical / Trunk Assessment: Kyphotic   Communication Communication Communication: HOH(with hearing aids)   Cognition Arousal/Alertness: Awake/alert Behavior During Therapy: Anxious Overall Cognitive Status: No family/caregiver present to determine baseline cognitive functioning                                 General Comments: anxious and nauseaous, difficulty staying on  subject   General Comments       Exercises     Shoulder Instructions      Home Living Family/patient expects to be discharged to:: Private residence Living Arrangements: Alone Available Help at Discharge: Friend(s);Available PRN/intermittently Type of Home: Independent living facility([facility]) Home Access: Level entry     Home Layout: One level     Bathroom Shower/Tub:  Occupational psychologist: Handicapped height     Home Equipment: Environmental consultant - 2 wheels;Cane - single point;Shower seat - built in;Grab bars - toilet;Grab bars - tub/shower;Other (comment)(02)          Prior Functioning/Environment Level of Independence: Independent with assistive device(s)        Comments: Reports using cane vs RW for ambulation, does housekeeping with exception of laundry which he pays a friend to do, drives        OT Problem List: Decreased strength;Decreased activity tolerance;Impaired balance (sitting and/or standing);Decreased knowledge of use of DME or AE;Cardiopulmonary status limiting activity      OT Treatment/Interventions: Self-care/ADL training;DME and/or AE instruction;Patient/family education;Balance training;Energy conservation    OT Goals(Current goals can be found in the care plan section) Acute Rehab OT Goals Patient Stated Goal: to go home and have hospice come   OT Goal Formulation: With patient Time For Goal Achievement: 04/28/18 Potential to Achieve Goals: Fair ADL Goals Pt Will Perform Grooming: with modified independence;standing Pt Will Perform Lower Body Bathing: with modified independence;sit to/from stand Pt Will Perform Lower Body Dressing: with modified independence;sit to/from stand Pt Will Transfer to Toilet: with modified independence;ambulating Pt Will Perform Toileting - Clothing Manipulation and hygiene: with modified independence;sit to/from stand Additional ADL Goal #1: Pt will state at least 3 energy conservation strategies as instructed.  OT Frequency: Min 2X/week   Barriers to D/C: Decreased caregiver support          Co-evaluation              AM-PAC PT "6 Clicks" Daily Activity     Outcome Measure Help from another person eating meals?: None Help from another person taking care of personal grooming?: A Little Help from another person toileting, which includes using toliet, bedpan, or urinal?: A  Little Help from another person bathing (including washing, rinsing, drying)?: A Little Help from another person to put on and taking off regular upper body clothing?: None Help from another person to put on and taking off regular lower body clothing?: A Little 6 Click Score: 20   End of Session Equipment Utilized During Treatment: Gait belt;Rolling walker;Oxygen(3L) Nurse Communication: Other (comment)(aware of pt's nausea)  Activity Tolerance: Other (comment)(limited by anxiety) Patient left: in chair;with call bell/phone within reach;with nursing/sitter in room  OT Visit Diagnosis: Unsteadiness on feet (R26.81);Other abnormalities of gait and mobility (R26.89);Muscle weakness (generalized) (M62.81)                Time: 9767-3419 OT Time Calculation (min): 21 min Charges:  OT General Charges $OT Visit: 1 Visit OT Evaluation $OT Eval Moderate Complexity: 1 Mod  Nestor Lewandowsky, OTR/L Acute Rehabilitation Services Pager: [phone] Office: [phone]  Malka So 04/14/2018, 11:49 AM

## 2018-04-14 NOTE — Progress Notes (Signed)
Physical Therapy Treatment Patient Details Name: Eric [name] MRN: [id number] DOB: November 27, 1926 Today's Date: 04/14/2018    History of Present Illness Pt is an 84 y/o male admitted secondary to tachycardia and SOB. Found to have a fib with RVR. Also with elevated troponins and awaiting cardiac cath, however, RN cleared for participation. PMH includes COPD on home O2, CAD, HTN, DM, R THA, and s/p colostomy.     PT Comments    Patient anxious and nauseous during session but ultimately agreeable to participate; needs frequent redirection and cues for safety. Currently performing transfers with minimal assistance and ambulating 25 feet with walker and min guard assist. Continues with decreased endurance, balance impairments, and weakness. Pt preferring to go home with hospice care.     Follow Up Recommendations  Supervision/Assistance - 24 hour;SNF;Other (comment)(wants to go home with hospice)     Equipment Recommendations  None recommended by PT    Recommendations for Other Services       Precautions / Restrictions Precautions Precautions: Fall Restrictions Weight Bearing Restrictions: No    Mobility  Bed Mobility Overal bed mobility: Needs Assistance Bed Mobility: Supine to Sit     Supine to sit: Min assist     General bed mobility comments: min assist for trunk elevation  Transfers Overall transfer level: Needs assistance Equipment used: Rolling walker (2 wheeled) Transfers: Sit to/from [facility] Sit to Stand: Min assist            Ambulation/Gait Ambulation/Gait assistance: Min guard Gait Distance (Feet): 25 Feet Assistive device: Rolling walker (2 wheeled) Gait Pattern/deviations: Step-through pattern;Decreased stride length;Trunk flexed Gait velocity: Decreased  Gait velocity interpretation: <1.8 ft/sec, indicate of risk for recurrent falls General Gait Details: Slow cadence and fatigues easily. HR 95-120s, SpO2 > 90% on 3L  O2.   Stairs             Wheelchair Mobility    Modified Rankin (Stroke Patients Only)       Balance Overall balance assessment: Needs assistance   Sitting balance-Leahy Scale: Fair     Standing balance support: Bilateral upper extremity supported;During functional activity Standing balance-Leahy Scale: Poor Standing balance comment: Reliant on BUE support.                             Cognition Arousal/Alertness: Awake/alert Behavior During Therapy: Anxious Overall Cognitive Status: No family/caregiver present to determine baseline cognitive functioning                                 General Comments: anxious and nauseous, difficulty staying on subject      Exercises      General Comments        Pertinent Vitals/Pain Pain Assessment: No/denies pain    Home Living                      Prior Function            PT Goals (current goals can now be found in the care plan section) Acute Rehab PT Goals Patient Stated Goal: to go home and have hospice come   Potential to Achieve Goals: Fair    Frequency    Min 3X/week      PT Plan Current plan remains appropriate    Co-evaluation  AM-PAC PT "6 Clicks" Daily Activity  Outcome Measure  Difficulty turning over in bed (including adjusting bedclothes, sheets and blankets)?: A Little Difficulty moving from lying on back to sitting on the side of the bed? : Unable Difficulty sitting down on and standing up from a chair with arms (e.g., wheelchair, bedside commode, etc,.)?: Unable Help needed moving to and from a bed to chair (including a wheelchair)?: A Little Help needed walking in hospital room?: A Little Help needed climbing 3-5 steps with a railing? : A Lot 6 Click Score: 13    End of Session Equipment Utilized During Treatment: Gait belt;Oxygen Activity Tolerance: Other (comment)(anxiety) Patient left: in chair;with nursing/sitter in  room;with call bell/phone within reach Nurse Communication: Mobility status;Other (comment) PT Visit Diagnosis: Unsteadiness on feet (R26.81);Muscle weakness (generalized) (M62.81)     Time: 8721-5872 PT Time Calculation (min) (ACUTE ONLY): 27 min  Charges:  $Therapeutic Activity: 23-37 mins                    Ellamae Sia, [place], DPT Acute Rehabilitation Services Pager [phone] Office [phone]   Willy Eddy 04/14/2018, 3:53 PM

## 2018-04-15 LAB — PROTIME-INR
INR: 1.14
Prothrombin Time: 14.5 seconds (ref 11.4–15.2)

## 2018-04-15 LAB — CBC
HCT: 29.9 % — ABNORMAL LOW (ref 39.0–52.0)
Hemoglobin: 9.2 g/dL — ABNORMAL LOW (ref 13.0–17.0)
MCH: 30.8 pg (ref 26.0–34.0)
MCHC: 30.8 g/dL (ref 30.0–36.0)
MCV: 100 fL (ref 80.0–100.0)
Platelets: 153 10*3/uL (ref 150–400)
RBC: 2.99 MIL/uL — ABNORMAL LOW (ref 4.22–5.81)
RDW: 13.6 % (ref 11.5–15.5)
WBC: 10.9 10*3/uL — ABNORMAL HIGH (ref 4.0–10.5)
nRBC: 0 % (ref 0.0–0.2)

## 2018-04-15 LAB — GLUCOSE, CAPILLARY
Glucose-Capillary: 109 mg/dL — ABNORMAL HIGH (ref 70–99)
Glucose-Capillary: 102 mg/dL — ABNORMAL HIGH (ref 70–99)
Glucose-Capillary: 143 mg/dL — ABNORMAL HIGH (ref 70–99)
Glucose-Capillary: 106 mg/dL — ABNORMAL HIGH (ref 70–99)

## 2018-04-15 MED ORDER — HYDROCODONE-ACETAMINOPHEN 7.5-325 MG PO TABS
1.0000 | ORAL_TABLET | Freq: Four times a day (QID) | ORAL | Status: DC | PRN
  Administered 2018-04-17: 1 via ORAL
  Filled 2018-04-15: qty 1

## 2018-04-15 MED ORDER — ACETAMINOPHEN 325 MG PO TABS
650.0000 mg | ORAL_TABLET | Freq: Four times a day (QID) | ORAL | Status: DC | PRN
  Administered 2018-04-15: 650 mg via ORAL
  Filled 2018-04-15: qty 2

## 2018-04-15 NOTE — Care Management Important Message (Signed)
Important Message  Patient Details  Name: JAQUARIUS SEDER MRN: [id number] Date of Birth: [birth year]   Medicare Important Message Given:  Yes    Adarrius Graeff P Coryn Mosso 04/15/2018, 3:42 PM

## 2018-04-15 NOTE — Progress Notes (Signed)
[place] [facility] 07 - Hospice and [facility]) RN visit at 9:05am  Received referral from [facility] on 04/14/18 for possible Home with Hospice services upon discharge from hospital. Chart and patient information under review by [place] eligibility pending at this time. [facility] Venia Carbon met with patient yesterday (11/7) to confirm interest in reinitiating Hospice services as he was on Hospice services with [facility] from 10/09/16 until 03/02/17. Patient needed time to process his situation and requested a check in with [facility] today.   Checked in with bedside RN then visited patient who was sitting up in bed eating breakfast with no visitors at side. Reviewed services, answered hospice related questions and supported patient. Patient still unsure about his wishes after discharge, and requested for an [facility] liaison to check back with him over the weekend. Patient has contact information for [facility] and encouraged to call with concerns/questions.   CMRN Nira Conn made aware of above information.  Please call with hospice related questions,  Gar Ponto, RN [facility] Liaison [place] found on AMION

## 2018-04-15 NOTE — Progress Notes (Addendum)
Progress Note  Patient Name: Eric [name] Date of Encounter: 04/15/2018  Primary Cardiologist: Dr. Tamala Julian  Subjective   Eric [name] was seen resting in his bed this morning. He said that he had some sharp chest pain that lasted a few seconds this morning. He described the chest pain as how he feels when his pacemaker goes off.   Inpatient Medications    Scheduled Meds: . amiodarone  200 mg Oral BID  . apixaban  2.5 mg Oral BID  . aspirin EC  81 mg Oral Daily  . budesonide  0.25 mg Nebulization BID  . diltiazem  60 mg Oral Q8H  . famotidine  40 mg Oral QHS  . fluticasone  2 spray Each Nare Daily  . furosemide  40 mg Oral BID  . guaiFENesin  600 mg Oral BID  . guaiFENesin-dextromethorphan  5 mL Oral TID  . insulin aspart  0-5 Units Subcutaneous QHS  . insulin aspart  0-9 Units Subcutaneous TID WC  . ipratropium  0.5 mg Nebulization TID  . isosorbide mononitrate  15 mg Oral Daily  . levalbuterol  0.63 mg Nebulization TID  . pravastatin  60 mg Oral q1800  . senna  2 tablet Oral Daily   Continuous Infusions:  PRN Meds: acetaminophen, alum & mag hydroxide-simeth, HYDROcodone-acetaminophen, nitroGLYCERIN, ondansetron (ZOFRAN) IV, polyvinyl alcohol   Vital Signs    Vitals:   04/14/18 1916 04/14/18 1937 04/14/18 2339 04/15/18 0359  BP:  (!) 131/44 (!) 134/45 101/60  Pulse:  93 90 87  Resp:  19 (!) 29 19  Temp:  98.3 F (36.8 C) 99.7 F (37.6 C) 99 F (37.2 C)  TempSrc:  Oral Axillary Oral  SpO2: 97% 98% 91% 100%  Weight:      Height:        Intake/Output Summary (Last 24 hours) at 04/15/2018 0719 Last data filed at 04/15/2018 0426 Gross per 24 hour  Intake 408.08 ml  Output 1250 ml  Net -841.92 ml    I/O since admission:   Filed Weights   04/12/18 1116  Weight: 103.4 kg    Telemetry    Atrial fibrillation- Personally Reviewed  ECG    ECG (independently read by me):Atrial fibrillation  Physical Exam    BP 101/60 (BP Location: Left Arm)   Pulse  87   Temp 99 F (37.2 C) (Oral)   Resp 19   Ht 5\' 6"  (1.676 m)   Wt 103.4 kg   SpO2 100%   BMI 36.80 kg/m   Physical Exam  Constitutional: He appears well-developed and well-nourished. No distress.  HENT:  Head: Normocephalic and atraumatic.  Eyes: Conjunctivae are normal.  Cardiovascular: Normal rate and intact distal pulses. An irregularly irregular rhythm present.  Respiratory: Effort normal and breath sounds normal.  Diffuse rhonchi throughout lung fields that is worse from admission  GI: Soft. Bowel sounds are normal. He exhibits no distension. There is no tenderness.  Musculoskeletal: He exhibits no edema.  Skin: He is not diaphoretic. No erythema.  Psychiatric: He has a normal mood and affect. His behavior is normal. Judgment normal.    Labs    Chemistry Recent Labs  Lab 04/12/18 1135 04/13/18 0245 04/14/18 0238  NA 139 138 137  K 3.7 3.5 3.9  CL 104 103 100  CO2 26 25 26   GLUCOSE 123* 123* 136*  BUN 18 25* 26*  CREATININE 1.34* 1.58* 1.63*  CALCIUM 9.3 9.1 9.3  PROT 6.5  --   --  ALBUMIN 3.7  --   --   AST 22  --   --   ALT 11  --   --   ALKPHOS 60  --   --   BILITOT 0.7  --   --   GFRNONAA 45* 37* 36*  GFRAA 52* 43* 41*  ANIONGAP 9 10 11      Hematology Recent Labs  Lab 04/13/18 0245 04/14/18 0238 04/15/18 0225  WBC 5.6 7.0 10.9*  RBC 3.05* 3.08* 2.99*  HGB 9.5* 9.5* 9.2*  HCT 29.8* 30.5* 29.9*  MCV 97.7 99.0 100.0  MCH 31.1 30.8 30.8  MCHC 31.9 31.1 30.8  RDW 13.4 13.5 13.6  PLT 168 168 153    Cardiac Enzymes Recent Labs  Lab 04/12/18 1830 04/12/18 2335 04/13/18 0816  TROPONINI 0.59* 0.64* 0.39*    Recent Labs  Lab 04/12/18 1157  TROPIPOC 0.41*     BNP Recent Labs  Lab 04/12/18 1135  BNP 483.0*     DDimer No results for input(s): DDIMER in the last 168 hours.   Lipid Panel     Component Value Date/Time   CHOL 123 04/12/2018 1830   TRIG 158 (H) 04/12/2018 1830   TRIG 70 01/06/2010   HDL 25 (L) 04/12/2018 1830    CHOLHDL 4.9 04/12/2018 1830   VLDL 32 04/12/2018 1830   LDLCALC 66 04/12/2018 1830     Radiology    No results found.  Cardiac Studies   TTE (04/12/18): LV EF: 45% - 50%  ------------------------------------------------------------------- History: PMH: Elevated Troponin Evaluation. Coronary artery disease. Chronic obstructive pulmonary disease. Risk factors: former smoker. ICM. sick sinus syndrome. Hypertension. Diabetes mellitus. Dyslipidemia.  ------------------------------------------------------------------- Study Conclusions  - Left ventricle: The cavity size was normal. Wall thickness was increased in a pattern of mild LVH. Systolic function was mildly reduced. The estimated ejection fraction was in the range of 45% to 50%. Diffuse hypokinesis. - Aortic valve: Valve mobility was restricted. - Aortic root: The aortic root was mildly dilated. - Mitral valve: Calcified annulus.  Impressions:  - Technically difficult; LV function mildly reduced (EF 45); mild LVH; calcified aortic valve not well interrogated but no AS by doppler; mildly dilated aortic root.   Patient Profile     84 y.o. male with CAD status post CABG, sick sinus syndrome, essential hypertension, CML presenting with shortness of breath and recurrent chest pain. Found in new onset atrial fibrillation on admission along with troponin elevation 0.4.  Assessment & Plan    Atrial fibrillation Patient continues to remain in atrial fibrillation.Will continue current management and monitor for conversion. CHA2DS2-VASc score of 4.   -continue po amiodarone  -continue po diltiazem -continue Eliquis -continue aspirin  81mg  -Continue Imdur30 mg daily -Nitroglycerin 0.4 mg sublingual every 5 minutes as needed -continue home pravastatin 60mg  qd  HFrEF Echo with EF 45 to 50% which is decreased from 55 to 65% in 2017. Put out 1.2L over past 24 hrs.    -Record daily weights and  I/O- weights have not been recorded in past 3 days -Lasix 40 mg twice daily  Essential hypertension The patient's blood pressure has been ranging 110-130/40-60s  -Discontinue home spironolactone 12.5 mg daily due to soft blood pressures  COPD The patient has copd mixed type for which he is being followe by Dr. Annamaria Boots. Chronically on 3L oxygen at home.   -Duonebsq6hrs prn -Budesonide 0.25 mg twice daily -Flonase -Mucinex 1200 mg twice daily -Chest physiotherapy  -Please make sure patient is using flutter valve -If  rhonchi persists, patient may benefit from chest xray  Goals of care:  The patient mentioned that he would like to go home with hospice services. I have consultd palliative care to talk to the patient about goals of care and also discuss whether he would like to continue anticoagulation. The patient does not want to use warfarin. He however will not be covered for a NOAC.   Lars Mage, MD Internal Medicine PGY2 Pager:[phone] 04/15/2018, 7:19 AM   Patient seen and examined. Agree with assessment and plan.  Atrial fibrillation rate now significantly improved in the 70s to 80s with intermittent ventricular pacing.  He was started on amiodarone 200 mg twice a day yesterday as well as Cardizem every 8 hours.  Continue present dose today but may be able to switch to daily dosing, Eliquis was started yesterday for systemic anticoagulation.  If patient is to have hospice assistance, this may be cost prohibitive.   Troy Sine, MD, [facility] 04/15/2018 11:01 AM

## 2018-04-15 NOTE — Progress Notes (Signed)
Palliative Medicine consult noted. Due to high referral volume, there may be a delay seeing this patient. Please call the Palliative Medicine Team office at [phone] if recommendations are needed in the interim.  Thank you for inviting [place] to see this patient.  [name] Pearlie Lafosse, RN, BSN, [facility] Palliative Medicine Team 04/15/2018 3:12 PM Office [phone]

## 2018-04-15 NOTE — Progress Notes (Signed)
[place] TEAM 1 - Stepdown/ICU TEAM  TARIG ZIMMERS  IWL:[id number] DOB: [birth year] DOA: 04/12/2018 PCP: [name], MD    Brief Narrative:  84 y.o.malewith a hx of CAD, Aortic sclerosis, ischemic cardiomyopathy, COPDon 3L at home, OSA, tachy-brady syndromes/p PPM,HTN, CML, and prostate CA who presentedwith2-3 days of dyspnea, tachycardia, and chest pain.   In the ED he was found to be in ST vs atrial ectopic tachycardia, w/ Twave inversions in v1 and avr. Troponin elevation to 0.41.Chest x-ray showed chronic emphysematous changes.After metoprolol, HR slowed to ~110 and EKG showed afib.  Subjective: Having lots of coughing, and some mild resp distress at the time of my visit. Denies CP, N/V, abdom pain. Feels sob, and weak in general.   Assessment & Plan:  Atrial fibrillation with RVR  CHA2DS2-VASc is 4 - now on Amiodarone as hypotension led to stopping HR better controlled today - meds per Cards - now on Eliquis   Elevated troponin - CAD s/p CABG Cont medical tx - Cards following   Systolic CHF  EF 17-40% w/ diffuse hypokinesis - no recent weight - ordered daily weights - net negative ~400 since admit - not grossly overloaded on exam  Filed Weights   04/12/18 1116  Weight: 103.4 kg    CKD 3 baseline crt 1.0-1.3  Recent Labs  Lab 04/12/18 1135 04/13/18 0245 04/14/18 0238  CREATININE 1.34* 1.58* 1.63*    Anxiety does not want to take Xanax which he has tried in the past - no acute flair at time of eval today   COPD - Chronic respiratory failure with hypoxia   3 L O2 at baseline - appears his resp status is quite fragile day-to-day  GERD Cont medical tx   Obstructive sleep apnea CPAP   Obesity - Body mass index is 36.8 kg/m.  CML (chronic myeloid leukemia)    Anemia of chronic disease  Hgb is stable   DM2 CBG well controlled  Dyslipidemia cont Pravastatin  Goals of Care  Pt strongly desires to continue to live idependently,  but does not appear to grasp that this is not safe - he voices desire for Hospice care, but does not understand what this means (he thinks they will cook for him and clean his apartment) - I will ask PC to see him to attempt to drive some of these points home and establish some firm GOC  DVT prophylaxis: Eliqius  Code Status: FULL CODE Family Communication: no family present at time of exam  Disposition Plan: desires return to ILF, but clearly needs higher level of care/24hr assist   Consultants:  Cardiology Hospice   Antimicrobials:  none  Objective: Blood pressure (!) 118/43, pulse 83, temperature 98.5 F (36.9 C), temperature source Oral, resp. rate (!) 24, height 5\' 6"  (1.676 m), weight 103.4 kg, SpO2 100 %.  Intake/Output Summary (Last 24 hours) at 04/15/2018 1108 Last data filed at 04/15/2018 0426 Gross per 24 hour  Intake 240 ml  Output 1100 ml  Net -860 ml   Filed Weights   04/12/18 1116  Weight: 103.4 kg    Examination: General: mild resp distress - able to complete full sentences  Lungs: course crackles th/o all fields - mild exp wheeze  Cardiovascular: irreg irreg - no M or rub  Abdomen: NT/ND, soft, bowel sounds positive, no rebound, no ascites, no appreciable mass Extremities: 1+ B LE edema   CBC: Recent Labs  Lab 04/12/18 1135 04/13/18 0245 04/14/18 0238 04/15/18 0225  WBC  6.2 5.6 7.0 10.9*  NEUTROABS 4.8  --   --   --   HGB 10.3* 9.5* 9.5* 9.2*  HCT 34.7* 29.8* 30.5* 29.9*  MCV 100.6* 97.7 99.0 100.0  PLT 199 168 168 703   Basic Metabolic Panel: Recent Labs  Lab 04/12/18 1135 04/13/18 0245 04/14/18 0238  NA 139 138 137  K 3.7 3.5 3.9  CL 104 103 100  CO2 26 25 26   GLUCOSE 123* 123* 136*  BUN 18 25* 26*  CREATININE 1.34* 1.58* 1.63*  CALCIUM 9.3 9.1 9.3   GFR: Estimated Creatinine Clearance: 34.6 mL/min (A) (by C-G formula based on SCr of 1.63 mg/dL (H)).  Liver Function Tests: Recent Labs  Lab 04/12/18 1135  AST 22  ALT 11    ALKPHOS 60  BILITOT 0.7  PROT 6.5  ALBUMIN 3.7    Coagulation Profile: Recent Labs  Lab 04/13/18 0245 04/14/18 0238 04/15/18 0225  INR 1.11 1.10 1.14    Cardiac Enzymes: Recent Labs  Lab 04/12/18 1830 04/12/18 2335 04/13/18 0816  TROPONINI 0.59* 0.64* 0.39*    HbA1C: Hgb A1c MFr Bld  Date/Time Value Ref Range Status  01/11/2018 02:39 PM 6.6 (H) 4.6 - 6.5 % Final    Comment:    Glycemic Control Guidelines for People with Diabetes:Non Diabetic:  <6%Goal of Therapy: <7%Additional Action Suggested:  >8%   04/16/2017 02:31 PM 6.5 (H) 4.8 - 5.6 % Final    Comment:    (NOTE) Pre diabetes:          5.7%-6.4% Diabetes:              >6.4% Glycemic control for   <7.0% adults with diabetes     CBG: Recent Labs  Lab 04/14/18 0802 04/14/18 1221 04/14/18 1647 04/14/18 2137 04/15/18 0830  GLUCAP 135* 161* 117* 133* 109*    Recent Results (from the past 240 hour(s))  MRSA PCR Screening     Status: None   Collection Time: 04/14/18 11:48 AM  Result Value Ref Range Status   MRSA by PCR NEGATIVE NEGATIVE Final    Comment:        The GeneXpert MRSA Assay (FDA approved for NASAL specimens only), is one component of a comprehensive MRSA colonization surveillance program. It is not intended to diagnose MRSA infection nor to guide or monitor treatment for MRSA infections. Performed at [facility], [place] [address] [place], [place] [address]      Scheduled Meds: . amiodarone  200 mg Oral BID  . apixaban  2.5 mg Oral BID  . aspirin EC  81 mg Oral Daily  . budesonide  0.25 mg Nebulization BID  . diltiazem  60 mg Oral Q8H  . famotidine  40 mg Oral QHS  . fluticasone  2 spray Each Nare Daily  . furosemide  40 mg Oral BID  . guaiFENesin  600 mg Oral BID  . guaiFENesin-dextromethorphan  5 mL Oral TID  . insulin aspart  0-5 Units Subcutaneous QHS  . insulin aspart  0-9 Units Subcutaneous TID WC  . ipratropium  0.5 mg Nebulization TID  . isosorbide mononitrate   15 mg Oral Daily  . levalbuterol  0.63 mg Nebulization TID  . pravastatin  60 mg Oral q1800  . senna  2 tablet Oral Daily     LOS: 3 days   Cherene Altes, MD [facility] Office  [phone] Pager - Text Page per Amion  If 7PM-7AM, please contact night-coverage per Amion 04/15/2018, 11:08  AM     

## 2018-04-15 NOTE — Care Management Note (Addendum)
Case Management Note  Patient Details  Name: Eric [name] MRN: [id number] Date of Birth: 01/07/1927  Subjective/Objective:                    Action/Plan:  Discussed disposition with patient at bedside.   Confirmed face sheet information with patient. He lives alone, in independent living [place]. He has oxygen through [place]. He is requesting to go home by ambulance at discharge. He is aware hospice will not provide 24 hour assistance. He already has home oxygen through [facility].   30 day free card for Eliquis given and explained.  Eliquis and Xarelto  100% of total cost deductible not met   Await goals of care meeting recommendations. Expected Discharge Date:                  Expected Discharge Plan:  Home w Hospice Care  In-House Referral:  Clinical Social Work  Discharge planning Services  CM Consult  Post Acute Care Choice:    Choice offered to:     DME Arranged:    DME Agency:     HH Arranged:    [place] Agency:     Status of Service:  In process, will continue to follow  If discussed at Long Length of Stay Meetings, dates discussed:    Additional Comments:  Marilu Favre, RN 04/15/2018, 11:03 AM

## 2018-04-16 DIAGNOSIS — D638 Anemia in other chronic diseases classified elsewhere: Secondary | ICD-10-CM

## 2018-04-16 DIAGNOSIS — N183 Chronic kidney disease, stage 3 unspecified: Secondary | ICD-10-CM

## 2018-04-16 DIAGNOSIS — I10 Essential (primary) hypertension: Secondary | ICD-10-CM

## 2018-04-16 DIAGNOSIS — I248 Other forms of acute ischemic heart disease: Secondary | ICD-10-CM

## 2018-04-16 DIAGNOSIS — I4891 Unspecified atrial fibrillation: Secondary | ICD-10-CM

## 2018-04-16 DIAGNOSIS — N179 Acute kidney failure, unspecified: Secondary | ICD-10-CM

## 2018-04-16 DIAGNOSIS — I25708 Atherosclerosis of coronary artery bypass graft(s), unspecified, with other forms of angina pectoris: Secondary | ICD-10-CM

## 2018-04-16 DIAGNOSIS — G4733 Obstructive sleep apnea (adult) (pediatric): Secondary | ICD-10-CM

## 2018-04-16 DIAGNOSIS — I5023 Acute on chronic systolic (congestive) heart failure: Secondary | ICD-10-CM

## 2018-04-16 DIAGNOSIS — C9210 Chronic myeloid leukemia, BCR/ABL-positive, not having achieved remission: Secondary | ICD-10-CM

## 2018-04-16 DIAGNOSIS — E785 Hyperlipidemia, unspecified: Secondary | ICD-10-CM

## 2018-04-16 DIAGNOSIS — I110 Hypertensive heart disease with heart failure: Secondary | ICD-10-CM

## 2018-04-16 LAB — GLUCOSE, CAPILLARY
Glucose-Capillary: 122 mg/dL — ABNORMAL HIGH (ref 70–99)
Glucose-Capillary: 127 mg/dL — ABNORMAL HIGH (ref 70–99)
Glucose-Capillary: 130 mg/dL — ABNORMAL HIGH (ref 70–99)
Glucose-Capillary: 149 mg/dL — ABNORMAL HIGH (ref 70–99)

## 2018-04-16 LAB — CBC
HCT: 28.6 % — ABNORMAL LOW (ref 39.0–52.0)
Hemoglobin: 8.6 g/dL — ABNORMAL LOW (ref 13.0–17.0)
MCH: 29.9 pg (ref 26.0–34.0)
MCHC: 30.1 g/dL (ref 30.0–36.0)
MCV: 99.3 fL (ref 80.0–100.0)
PLATELETS: 152 10*3/uL (ref 150–400)
RBC: 2.88 MIL/uL — AB (ref 4.22–5.81)
RDW: 13.9 % (ref 11.5–15.5)
WBC: 11.3 10*3/uL — ABNORMAL HIGH (ref 4.0–10.5)
nRBC: 0 % (ref 0.0–0.2)

## 2018-04-16 LAB — COMPREHENSIVE METABOLIC PANEL
ALT: 14 U/L (ref 0–44)
ANION GAP: 8 (ref 5–15)
AST: 21 U/L (ref 15–41)
Albumin: 3.6 g/dL (ref 3.5–5.0)
Alkaline Phosphatase: 53 U/L (ref 38–126)
BUN: 31 mg/dL — AB (ref 8–23)
CALCIUM: 9.6 mg/dL (ref 8.9–10.3)
CO2: 26 mmol/L (ref 22–32)
CREATININE: 2.17 mg/dL — AB (ref 0.61–1.24)
Chloride: 99 mmol/L (ref 98–111)
GFR, EST AFRICAN AMERICAN: 29 mL/min — AB (ref >60)
GFR, EST NON AFRICAN AMERICAN: 25 mL/min — AB (ref >60)
GLUCOSE: 136 mg/dL — AB (ref 70–99)
POTASSIUM: 4 mmol/L (ref 3.5–5.1)
SODIUM: 133 mmol/L — AB (ref 135–145)
TOTAL PROTEIN: 6.3 g/dL — AB (ref 6.5–8.1)
Total Bilirubin: 1 mg/dL (ref 0.3–1.2)

## 2018-04-16 LAB — PROTIME-INR
INR: 1.29
Prothrombin Time: 15.9 seconds — ABNORMAL HIGH (ref 11.4–15.2)

## 2018-04-16 MED ORDER — FUROSEMIDE 40 MG PO TABS
40.0000 mg | ORAL_TABLET | Freq: Two times a day (BID) | ORAL | Status: DC
  Administered 2018-04-17: 40 mg via ORAL
  Filled 2018-04-16: qty 1

## 2018-04-16 MED ORDER — OXYCODONE HCL 5 MG/5ML PO SOLN
2.5000 mg | ORAL | Status: DC | PRN
  Administered 2018-04-17: 2.5 mg via ORAL
  Filled 2018-04-16 (×2): qty 5

## 2018-04-16 MED ORDER — DILTIAZEM HCL ER COATED BEADS 180 MG PO CP24
180.0000 mg | ORAL_CAPSULE | Freq: Every day | ORAL | Status: DC
  Administered 2018-04-17 – 2018-04-20 (×4): 180 mg via ORAL
  Filled 2018-04-16 (×4): qty 1

## 2018-04-16 NOTE — Consult Note (Signed)
Consultation Note Date: 04/16/2018   Patient Name: Eric [name]  DOB: September 08, 1926  MRN: [id number]  Age / Sex: 84 y.o., male  PCP: [name], MD Referring Physician: Cherene Altes, MD  Reason for Consultation: Establishing goals of care  HPI/Patient Profile: 84 y.o. male  with past medical history of COPD, CML, CKD, aortic sclerosis on 3L O2 at home, tachy-brady syndrome s/p pacemaker placment, HTN, s/p colostomy admitted on 04/12/2018 with dyspnea, tachycardia, chest pain, fatigue. Workup revealed new onset a-fib with RVR and possibly ischemia with elevated troponin. Patient has been on Hospice in the past and verbalized to care providers he wanted to go home with Hospice. Palliative consulted for further delineation of GOC.    Clinical Assessment and Goals of Care:  I have reviewed medical records including EPIC notes, labs and imaging, assessed the patient and then met at the bedside along with patient  to discuss diagnosis prognosis, GOC, EOL wishes, disposition and options.  I introduced Palliative Medicine as specialized medical care for people living with serious illness. It focuses on providing relief from the symptoms and stress of a serious illness. The goal is to improve quality of life for both the patient and the family.  We discussed a brief life review of the patient. He lives at the [facility]. He used to work at a Production designer, theatre/television/film and and Conservation officer, historic buildings.  As far as functional and nutritional status- he eats well. He finished his breakfast tray while we were talking. Prior to admission he tells me he was driving and living independently. He does get SOB with activity. Walking to his car, completing ADL's in his home makes him SOB. After further discussion he notes that he generally stays home, sleeps a lot during the day and evening. His mobility has  decreased over the last several months.   We discussed their current illness and what it means in the larger context of their on-going co-morbidities.  Natural disease trajectory and expectations at EOL were discussed. [name] understands that he is at end of life. His goals are "to be taken care of as best as possible" and to live as independently as possible. He states he absolutely does not want to go to a rehab or nursing facility. He states he has been in the past and sees no benefit to going to one now.  Advanced directives, concepts specific to code status, artifical feeding and hydration, and rehospitalization were considered and discussed. [name] previously had a DNR and wishes for it to be in place now. He understands this to mean if his heart and breathing stopped, he would not want efforts made to resuscitate him. He would not want to be kept alive artificially.   Hospice and Palliative Care services outpatient were explained and offered. He has had Hospice in the past. He stated it was stopped because- "they got upset because I was driving myself to the store". He is considering going home with Hospice again. We discussed the Hospice  philosophy- support at end of life, living life with quality, not focusing on life prolonging measures. [name] agrees these are in line with his goals. He states "there is no getting better at my age".   [name] states he has no family or friends in the area that can provide help for him. He declines to allow me to call his Yolanda Bonine who is listed as an emergency contact to provide some assistance with his decision making. He states his family is not talking to him.  We discussed his symptoms- he has a chronic cough- he says at home he was using a breathing treatment sometimes four times a day. He has SOB that is worsened with ADL's- I observe worsening SOB with eating- he agrees to trial of low dose opiod for SOB.  Primary Decision Maker PATIENT    SUMMARY OF RECOMMENDATIONS -DNR -Patient agrees to another visit from [facility] -Would also recommend outpatient Palliative support if patient decides he does not want Hospice -PMT will continue to follow progress -Oxycodone solution 2.46m q2hr prn for SOB, cough -Case manager consult for possible support options from [facility]  DNR  Palliative Prophylaxis:   Bowel Regimen and Frequent Pain Assessment  Additional Recommendations (Limitations, Scope, Preferences):  Full Scope Treatment  Prognosis:    < 6 months d/t CHF, new a-fib in setting of CKD, COPD on chronic O2, increasing SOB with ADL's, decreasing functional status  Discharge Planning: To Be Determined  Primary Diagnoses: Present on Admission: . Obesity (BMI 30-39.9) . Dyslipidemia . Hypertensive heart disease with CHF ([facility] . Coronary atherosclerosis . COPD mixed type ([facility] . GERD . Obstructive sleep apnea . Chronic diastolic heart failure ([facility] . Essential hypertension . Chronic respiratory failure with hypoxia ([facility] . CML (chronic myeloid leukemia) ([facility]   I have reviewed the medical record, interviewed the patient and family, and examined the patient. The following aspects are pertinent.  Past Medical History:  Diagnosis Date  . ALLERGIC RHINITIS   . ANEMIA-NOS   . AORTIC SCLEROSIS   . Asthma   . CARDIOMYOPATHY, ISCHEMIC   . CAROTID BRUIT, RIGHT 02/27/2008  . Cataract    surgery  . CML (chronic myeloid leukemia) ([place] 06/26/2015  . COPD   . CORONARY ARTERY DISEASE    a. s/p CABG in 1995 b. DES in 2008, 2009, and most recent in 2012 with DES to SVG-OM  . DIABETES MELLITUS-TYPE II    diet controlled  . Diastolic dysfunction, Grade 1 11/24/2014  . Diverticulitis of colon with perforation 11/22/2014  . Diverticulosis   . GERD   . HIATAL HERNIA   . Hx of echocardiogram    Echo (9/15):  Mild LVH, EF 50-55%, no RWMA, Gr 1 DD, MAC, mild LAE.  .Marland KitchenHYPERLIPIDEMIA     . HYPERTENSION   . Hyponatremia 11/22/2014  . IBS (irritable bowel syndrome)   . LACTOSE INTOLERANCE   . OA (osteoarthritis)   . OBESITY   . On home oxygen therapy    "3L all the time" (10/12/2016)  . Partial small bowel obstruction ([facility]   . PERIPHERAL VASCULAR DISEASE   . Primary hyperparathyroidism ([facility]    Lab Results Component Value Date  PTH 150.7* 02/13/2013  CALCIUM 11.0* 02/13/2013  CAION 1.21 03/15/2008    . Prostate cancer ([facility]    seed implants 2004  . SICK SINUS/ TACHY-BRADY SYNDROME 09/2007   s/p PPM st [facility]  . Sleep apnea   . SMALL  BOWEL OBSTRUCTION 04/18/2009   Qualifier: History of  By: Asa Lente MD, Jannifer Rodney Symptomatic diverticulosis 01/18/2009   Qualifier: Diagnosis of  By: Shane Crutch, Amy S    Social History   Socioeconomic History  . Marital status: Widowed    Spouse name: Not on file  . Number of children: Not on file  . Years of education: Not on file  . Highest education level: Not on file  Occupational History  . Occupation: retired    Fish farm manager: RETIRED  Social Needs  . Financial resource strain: Somewhat hard  . Food insecurity:    Worry: Patient refused    Inability: Patient refused  . Transportation needs:    Medical: Patient refused    Non-medical: Patient refused  Tobacco Use  . Smoking status: Former Smoker    Packs/day: 1.00    Years: 25.00    Pack years: 25.00    Types: Cigarettes    Last attempt to quit: 06/08/1994    Years since quitting: 23.8  . Smokeless tobacco: Never Used  Substance and Sexual Activity  . Alcohol use: No    Alcohol/week: 0.0 standard drinks  . Drug use: No  . 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 Physical activity:    Days per week: Patient refused    Minutes per session: Patient refused  . Stress: Not on file  Relationships  . Social connections:    Talks on phone: Patient refused    Gets together: Patient refused     Attends religious service: Patient refused    Active member of club or organization: Patient refused    Attends meetings of clubs or organizations: Patient refused    Relationship status: Patient refused  Other Topics Concern  . Not on file  Social History Narrative  . Not on file   Family History  Problem Relation Age of Onset  . Hypertension Mother   . Cancer Mother   . Heart attack Unknown   . Heart attack Unknown   . Heart attack Brother   . Colon cancer Neg Hx   . Stroke Neg Hx    Scheduled Meds: . amiodarone  200 mg Oral BID  . apixaban  2.5 mg Oral BID  . aspirin EC  81 mg Oral Daily  . budesonide  0.25 mg Nebulization BID  . diltiazem  60 mg Oral Q8H  . famotidine  40 mg Oral QHS  . fluticasone  2 spray Each Nare Daily  . furosemide  40 mg Oral BID  . guaiFENesin  600 mg Oral BID  . guaiFENesin-dextromethorphan  5 mL Oral TID  . insulin aspart  0-5 Units Subcutaneous QHS  . insulin aspart  0-9 Units Subcutaneous TID WC  . ipratropium  0.5 mg Nebulization TID  . isosorbide mononitrate  15 mg Oral Daily  . levalbuterol  0.63 mg Nebulization TID  . pravastatin  60 mg Oral q1800  . senna  2 tablet Oral Daily   Continuous Infusions: PRN Meds:.acetaminophen, alum & mag hydroxide-simeth, HYDROcodone-acetaminophen, nitroGLYCERIN, ondansetron (ZOFRAN) IV, polyvinyl alcohol Medications Prior to Admission:  Prior to Admission medications   Medication Sig Start Date End Date Taking? Authorizing Provider  acetaminophen (TYLENOL) 500 MG tablet Take 1,000 mg by mouth every 6 (six) hours as needed for mild pain or fever.   Yes [provider]  ALPRAZolam (XANAX) 0.25 MG tablet Take 1 tablet (0.25  mg total) by mouth daily as needed for anxiety. 05/28/17  Yes [name], MD  fluticasone [facility]) 50 MCG/ACT nasal spray Place 2 sprays into both nostrils daily. Allergies 08/01/14  Yes [name], MD  furosemide (LASIX) 40 MG tablet Take 1 tablet (40 mg  total) by mouth 2 (two) times daily. 03/31/18  Yes Belva Crome, MD  glipiZIDE (GLUCOTROL) 5 MG tablet Take 2.5 mg 2 (two) times daily by mouth.   Yes [provider]  guaiFENesin-dextromethorphan (ROBITUSSIN DM) 100-10 MG/5ML syrup Take 5 mLs by mouth every 4 (four) hours as needed for cough. 10/15/16  Yes Lavina Hamman, MD  HYDROcodone-acetaminophen (NORCO) 7.5-325 MG tablet Take 1 tablet every 6 (six) hours as needed by mouth for moderate pain or severe pain. 04/22/17  Yes Dancy, Amanda, PA-C  ipratropium-albuterol (DUONEB) 0.5-2.5 (3) MG/3ML SOLN Take 3 mLs by nebulization 3 (three) times daily.  02/17/17  Yes [provider]  nitroGLYCERIN (NITROSTAT) 0.4 MG SL tablet Place 0.4 mg under the tongue every 5 (five) minutes as needed for chest pain. Reported on 12/12/2015   Yes [provider]  NON FORMULARY Place 2 L into the nose continuous.   Yes [provider]  Polyethyl Glycol-Propyl Glycol (SYSTANE) 0.4-0.3 % SOLN Apply 2 drops to eye 3 (three) times daily as needed (dry eyes).    Yes [provider]  pravastatin (PRAVACHOL) 20 MG tablet Take 60 mg by mouth at bedtime.    Yes [provider]  ranitidine (ZANTAC) 300 MG tablet Take 600 mg by mouth at bedtime.    Yes [provider]  senna (SENOKOT) 8.6 MG tablet Take 2 tablets by mouth daily.    Yes [provider]  spironolactone (ALDACTONE) 25 MG tablet Take 0.5 tablets (12.5 mg total) by mouth daily. 04/07/18 04/02/19 Yes Belva Crome, MD  valsartan (DIOVAN) 160 MG tablet Take 160 mg by mouth daily.   Yes [provider]  budesonide (PULMICORT) 0.25 MG/2ML nebulizer solution Take 2 mLs (0.25 mg total) by nebulization 2 (two) times daily. Patient not taking: Reported on 04/12/2018 12/06/17   Lauraine Rinne, NP  glucose blood (ONE TOUCH ULTRA TEST) test strip USE TO CHECK BLOOD SUGARS ONCE A DAY 10/28/17   [name], MD   Allergies  Allergen Reactions    . Actos [Pioglitazone Hydrochloride] Other (See Comments)    "felt funny, drowsy, and weak":  [name] Buprenorphine Hcl Nausea And Vomiting  . Celebrex [Celecoxib] Other (See Comments)    "felt funny"  . Demerol Palpitations and Other (See Comments)    Increased BP  . Meperidine Palpitations    Other reaction(s): Other (See Comments) Increased BP  . Morphine And Related Nausea And Vomiting  . Ciprofloxacin Other (See Comments)    arthralgia  . Metformin Nausea And Vomiting  . Zocor [Simvastatin] Other (See Comments)    Makes pt very drowsy   Review of Systems  Constitutional: Positive for activity change and fatigue.  Respiratory: Positive for cough.   Gastrointestinal: Negative for constipation.    Physical Exam  Constitutional: He is oriented to person, place, and time. He appears well-developed and well-nourished.  HENT:  Head: Normocephalic and atraumatic.  Pulmonary/Chest: Tachypnea noted.  Abdominal:  colostomy  Neurological: He is alert and oriented to person, place, and time.  Skin: Skin is warm and dry.  Psychiatric: He has a normal mood and affect.  Nursing note and vitals reviewed.   Vital Signs: BP  106/79 (BP Location: Left Arm)   Pulse 84   Temp 97.9 F (36.6 C) (Oral)   Resp 20   Ht _0  (1.676 m)   Wt 104.6 kg   SpO2 97%   BMI 37.22 kg/m  Pain Scale: 0-10 POSS *See Group Information*: 1-Acceptable,Awake and alert Pain Score: 0-No pain   SpO2: SpO2: 97 % O2 Device:SpO2: 97 % O2 Flow Rate: .O2 Flow Rate (L/min): 3 L/min  IO: Intake/output summary:   Intake/Output Summary (Last 24 hours) at 04/16/2018 1002 Last data filed at 04/16/2018 0828 Gross per 24 hour  Intake 720 ml  Output 1100 ml  Net -380 ml    LBM: Last BM Date: 04/14/18 Baseline Weight: Weight: 103.4 kg Most recent weight: Weight: 104.6 kg     Palliative Assessment/Data: PPS: 40%     Thank you for this consult. Palliative medicine will continue to follow and assist as needed.    Time In: 0930  Time Out: 1040 Time Total: 70 minutes Greater than 50%  of this time was spent counseling and coordinating care related to the above assessment and plan.  Signed by: Mariana Kaufman, AGNP-C Palliative Medicine    Please contact Palliative Medicine Team phone at [phone] for questions and concerns.  For individual provider: See Shea Evans

## 2018-04-16 NOTE — Progress Notes (Signed)
[facility]) RN Note  Met with patient in room to discuss hospice services. Pt does not really desire to discuss hospice services at this time as he wants to get home and see what he can do on his own. We did discuss safety concerns at home and the desire to help him in any way we can. We reviewed the care and services he received while a prior patient with [place] and he recalls these services and care providers very well. Conversation was ended with encouraging patient to think about how we might best be able to support him at discharge. He agrees to think on it some this evening and I advised I would be back to visit with him tomorrow.  Please call with any hospice related questions or concerns.  Thank you, Margaretmary Eddy, RN, BSN [place] [phone]  [place] are on [place].

## 2018-04-16 NOTE — Progress Notes (Signed)
Eric [name]  Eric [name]  ZOX:[id number] DOB: 1926/07/24 DOA: 04/12/2018 PCP: [name], MD    Brief Narrative:  84 y.o.malewith a hx of CAD, Aortic sclerosis, ischemic cardiomyopathy, COPDon 3L at home, OSA, tachy-brady syndromes/p PPM,HTN, CML, and prostate CA who presentedwith2-3 days of dyspnea, tachycardia, and chest pain.   In the ED he was found to be in ST vs atrial ectopic tachycardia, w/ Twave inversions in V1 and aVr. Troponin elevation to 0.41.Chest x-ray showed chronic emphysematous changes.After metoprolol, HR slowed to ~110 and EKG showed afib.  Subjective: Reports that he is feeling much better. Denies current chest pain or sob. Is adamant that he will return to independent living.    Assessment & Plan:  Newly diagnosed Atrial fibrillation with RVR  CHA2DS2-VASc is 4 - now on Amiodarone and Eliquis - stabilizing clinically - cont to ambulate   Elevated troponin - CAD s/p CABG Cont medical tx - Cards following - asymptomatic at this time   Systolic CHF  EF 81-19% w/ diffuse hypokinesis - weight trending up - net negative ~500 since admit - mild overload on exam - avoid change in diuretic dose for now though as crt climbing   [employer]   04/12/18 1116 04/16/18 0400  Weight: 103.4 kg 104.6 kg    CKD 3 baseline crt 1.0-1.3 - crt climbing - hold afternoon lasix dose and follow   Recent Labs  Lab 04/12/18 1135 04/13/18 0245 04/14/18 0238 04/16/18 0209  CREATININE 1.34* 1.58* 1.63* 2.17*    Anxiety does not want to take Xanax which he has tried in the past - no acute flair at this time   COPD - Chronic respiratory failure with hypoxia   3 L O2 at baseline - appears his resp status is quite fragile day-to-day - presently stable on home O2 dose   GERD Cont medical tx   Obstructive sleep apnea CPAP   Obesity - Body mass index is 37.22 kg/m.  CML (chronic myeloid leukemia)    Anemia of chronic  disease  Hgb is stable   DM2 CBG well controlled  Dyslipidemia cont Pravastatin  Goals of Care  Pt strongly desires to continue to live idependently, but does not appear to grasp that this is not safe   DVT prophylaxis: Eliqius  Code Status: DNR - NO CODE Family Communication: no family present at time of exam  Disposition Plan: desires return to ILF, but clearly needs higher level of care/24hr assist   Consultants:  Cardiology Hospice  Palliative Care   Antimicrobials:  none  Objective: Blood pressure 106/79, pulse 84, temperature 97.9 F (36.6 C), temperature source Oral, resp. rate 20, height 5\' 6"  (1.676 m), weight 104.6 kg, SpO2 97 %.  Intake/Output Summary (Last 24 hours) at 04/16/2018 1135 Last data filed at 04/16/2018 0900 Gross per 24 hour  Intake 960 ml  Output 1100 ml  Net -140 ml   Filed Weights   04/12/18 1116 04/16/18 0400  Weight: 103.4 kg 104.6 kg    Examination: General: no acute distress - resting comfortably   Lungs: course crackles th/o all fields - no wheezing  Cardiovascular: irreg irreg - no M or rub - rate controlled  Abdomen: NT/ND, soft, bowel sounds positive, no rebound Extremities: 1+ B LE edema   CBC: Recent Labs  Lab 04/12/18 1135  04/14/18 0238 04/15/18 0225 04/16/18 0209  WBC 6.2   < > 7.0 10.9* 11.3*  NEUTROABS 4.8  --   --   --   --  HGB 10.3*   < > 9.5* 9.2* 8.6*  HCT 34.7*   < > 30.5* 29.9* 28.6*  MCV 100.6*   < > 99.0 100.0 99.3  PLT 199   < > 168 153 152   < > = values in this interval not displayed.   Basic Metabolic Panel: Recent Labs  Lab 04/13/18 0245 04/14/18 0238 04/16/18 0209  NA 138 137 133*  K 3.5 3.9 4.0  CL 103 100 99  CO2 25 26 26   GLUCOSE 123* 136* 136*  BUN 25* 26* 31*  CREATININE 1.58* 1.63* 2.17*  CALCIUM 9.1 9.3 9.6   GFR: Estimated Creatinine Clearance: 26.1 mL/min (A) (by C-G formula based on SCr of 2.17 mg/dL (H)).  Liver Function Tests: Recent Labs  Lab 04/12/18 1135  04/16/18 0209  AST 22 21  ALT 11 14  ALKPHOS 60 53  BILITOT 0.7 1.0  PROT 6.5 6.3*  ALBUMIN 3.7 3.6    Coagulation Profile: Recent Labs  Lab 04/13/18 0245 04/14/18 0238 04/15/18 0225 04/16/18 0209  INR 1.11 1.10 1.14 1.29    Cardiac Enzymes: Recent Labs  Lab 04/12/18 1830 04/12/18 2335 04/13/18 0816  TROPONINI 0.59* 0.64* 0.39*    HbA1C: Hgb A1c MFr Bld  Date/Time Value Ref Range Status  01/11/2018 02:39 PM 6.6 (H) 4.6 - 6.5 % Final    Comment:    Glycemic Control Guidelines for People with Diabetes:Non Diabetic:  <6%Goal of Therapy: <7%Additional Action Suggested:  >8%   04/16/2017 02:31 PM 6.5 (H) 4.8 - 5.6 % Final    Comment:    (NOTE) Pre diabetes:          5.7%-6.4% Diabetes:              >6.4% Glycemic control for   <7.0% adults with diabetes     CBG: Recent Labs  Lab 04/15/18 0830 04/15/18 1221 04/15/18 1721 04/15/18 2122 04/16/18 0803  GLUCAP 109* 102* 143* 106* 122*    Recent Results (from the past 240 hour(s))  MRSA PCR Screening     Status: None   Collection Time: 04/14/18 11:48 AM  Result Value Ref Range Status   MRSA by PCR NEGATIVE NEGATIVE Final    Comment:        The GeneXpert MRSA Assay (FDA approved for NASAL specimens only), is one component of a comprehensive MRSA colonization surveillance program. It is not intended to diagnose MRSA infection nor to guide or monitor treatment for MRSA infections. Performed at [facility], [place] [address], [place], [place] [address]      Scheduled Meds: . amiodarone  200 mg Oral BID  . apixaban  2.5 mg Oral BID  . aspirin EC  81 mg Oral Daily  . budesonide  0.25 mg Nebulization BID  . diltiazem  60 mg Oral Q8H  . famotidine  40 mg Oral QHS  . fluticasone  2 spray Each Nare Daily  . furosemide  40 mg Oral BID  . guaiFENesin  600 mg Oral BID  . guaiFENesin-dextromethorphan  5 mL Oral TID  . insulin aspart  0-5 Units Subcutaneous QHS  . insulin aspart  0-9 Units Subcutaneous  TID WC  . ipratropium  0.5 mg Nebulization TID  . isosorbide mononitrate  15 mg Oral Daily  . levalbuterol  0.63 mg Nebulization TID  . pravastatin  60 mg Oral q1800  . senna  2 tablet Oral Daily     LOS: 4 days   Cherene Altes, MD [facility] Office  [phone] Pager - Text Page per Shea Evans  If 7PM-7AM, please contact night-coverage per Amion 04/16/2018, 11:35 AM

## 2018-04-16 NOTE — Progress Notes (Signed)
Progress Note  Patient Name: Eric [name] Date of Encounter: 04/16/2018  Primary Cardiologist: Sinclair Grooms, MD   Subjective     Inpatient Medications    Scheduled Meds: . amiodarone  200 mg Oral BID  . apixaban  2.5 mg Oral BID  . aspirin EC  81 mg Oral Daily  . budesonide  0.25 mg Nebulization BID  . [START ON 04/17/2018] diltiazem  180 mg Oral Daily  . diltiazem  60 mg Oral Q8H  . famotidine  40 mg Oral QHS  . fluticasone  2 spray Each Nare Daily  . [START ON 04/17/2018] furosemide  40 mg Oral BID  . guaiFENesin  600 mg Oral BID  . guaiFENesin-dextromethorphan  5 mL Oral TID  . insulin aspart  0-5 Units Subcutaneous QHS  . insulin aspart  0-9 Units Subcutaneous TID WC  . ipratropium  0.5 mg Nebulization TID  . isosorbide mononitrate  15 mg Oral Daily  . levalbuterol  0.63 mg Nebulization TID  . pravastatin  60 mg Oral q1800  . senna  2 tablet Oral Daily   Continuous Infusions:  PRN Meds: acetaminophen, alum & mag hydroxide-simeth, HYDROcodone-acetaminophen, nitroGLYCERIN, ondansetron (ZOFRAN) IV, oxyCODONE, polyvinyl alcohol   Vital Signs    Vitals:   04/16/18 0604 04/16/18 0812 04/16/18 0835 04/16/18 0838  BP:  106/79    Pulse:  84    Resp:  20    Temp:  97.9 F (36.6 C)    TempSrc:  Oral    SpO2: 100% 97% 99% 97%  Weight:      Height:        Intake/Output Summary (Last 24 hours) at 04/16/2018 1326 Last data filed at 04/16/2018 0900 Gross per 24 hour  Intake 720 ml  Output 1100 ml  Net -380 ml   Filed Weights   04/12/18 1116 04/16/18 0400  Weight: 103.4 kg 104.6 kg    Telemetry    A fib with PVC's and intermittent pacing - Personally Reviewed  ECG    Rate controlled atrial fibrillation - Personally Reviewed  Physical Exam   GEN: No acute distress.   Neck: No JVD Cardiac:  Irregular rhythm, normal rate, no murmurs, rubs, or gallops.  Respiratory:  Poor air movement with diffuse rhonchi and expiratory wheezes. GI: Soft,  nontender, non-distended  MS:  Trace to 1+ bilateral lower extremity edema; No deformity. Neuro:  Nonfocal  Psych: Normal affect   Labs    Chemistry Recent Labs  Lab 04/12/18 1135 04/13/18 0245 04/14/18 0238 04/16/18 0209  NA 139 138 137 133*  K 3.7 3.5 3.9 4.0  CL 104 103 100 99  CO2 26 25 26 26   GLUCOSE 123* 123* 136* 136*  BUN 18 25* 26* 31*  CREATININE 1.34* 1.58* 1.63* 2.17*  CALCIUM 9.3 9.1 9.3 9.6  PROT 6.5  --   --  6.3*  ALBUMIN 3.7  --   --  3.6  AST 22  --   --  21  ALT 11  --   --  14  ALKPHOS 60  --   --  53  BILITOT 0.7  --   --  1.0  GFRNONAA 45* 37* 36* 25*  GFRAA 52* 43* 41* 29*  ANIONGAP 9 10 11 8      Hematology Recent Labs  Lab 04/14/18 0238 04/15/18 0225 04/16/18 0209  WBC 7.0 10.9* 11.3*  RBC 3.08* 2.99* 2.88*  HGB 9.5* 9.2* 8.6*  HCT 30.5* 29.9* 28.6*  MCV  99.0 100.0 99.3  MCH 30.8 30.8 29.9  MCHC 31.1 30.8 30.1  RDW 13.5 13.6 13.9  PLT 168 153 152    Cardiac Enzymes Recent Labs  Lab 04/12/18 1830 04/12/18 2335 04/13/18 0816  TROPONINI 0.59* 0.64* 0.39*    Recent Labs  Lab 04/12/18 1157  TROPIPOC 0.41*     BNP Recent Labs  Lab 04/12/18 1135  BNP 483.0*     DDimer No results for input(s): DDIMER in the last 168 hours.   Radiology    No results found.  Cardiac Studies   TTE (04/12/18): LV EF: 45% - 50%  ------------------------------------------------------------------- History: PMH: Elevated Troponin Evaluation. Coronary artery disease. Chronic obstructive pulmonary disease. Risk factors: former smoker. ICM. sick sinus syndrome. Hypertension. Diabetes mellitus. Dyslipidemia.  ------------------------------------------------------------------- Study Conclusions  - Left ventricle: The cavity size was normal. Wall thickness was increased in a pattern of mild LVH. Systolic function was mildly reduced. The estimated ejection fraction was in the range of 45% to 50%. Diffuse hypokinesis. -  Aortic valve: Valve mobility was restricted. - Aortic root: The aortic root was mildly dilated. - Mitral valve: Calcified annulus.  Impressions:  - Technically difficult; LV function mildly reduced (EF 45); mild LVH; calcified aortic valve not well interrogated but no AS by doppler; mildly dilated aortic root.  Patient Profile     84 y.o. male withCAD status post CABG, sick sinus syndrome, essential hypertension, CML presenting with shortness of breath and recurrent chest pain. Found in new onset atrial fibrillation on admission along with troponin elevation 0.4.  Assessment & Plan    1.  Rapid atrial fibrillation: Heart rate is currently controlled on oral amiodarone and diltiazem.  Anticoagulated with low-dose Eliquis given age and elevated creatinine.  Given his significant COPD with diffuse wheezing, beta-blockers would not be optimal and thus calcium channel blockers are being utilized to reduce heart rate.  Currently on short acting diltiazem which is being transitioned to long-acting diltiazem 180 mg daily on 04/17/2018.     2.  Coronary artery disease/CABG: Symptomatically stable.  We will continue medical management.  Continue aspirin, Imdur and pravastatin.  Troponin elevation consistent with demand ischemia.  3.  Acute on chronic systolic heart failure: Currently on Lasix 40 mg twice daily.  Afternoon Lasix dosage will be held due to elevated creatinine. 390 cc output in last 24 hours.  Creatinine elevated so we will not advance diuretic dosage at this time.  4.  Tachycardia-bradycardia syndrome status post pacemaker: Stable.  5.  Chronic kidney disease stage III: Creatinine up to 2.17.  Afternoon Lasix dose will be held.  6.  COPD: Uses 3 L of oxygen at baseline.  Currently stable.  7.  Anemia of chronic disease: Hemoglobin is stable.  Graph 8.  Dyslipidemia: Continue pravastatin.      For questions or updates, please contact [place] Please consult  [email or web address] for contact info under Cardiology/STEMI.      Signed, Kate Sable, MD  04/16/2018, 1:26 PM

## 2018-04-16 NOTE — Progress Notes (Signed)
Occupational Therapy Treatment Patient Details Name: Eric [name] MRN: [id number] DOB: 09-12-1926 Today's Date: 04/16/2018    History of present illness Pt is an 84 y/o male admitted secondary to tachycardia and SOB. Found to have a fib with RVR. Also with elevated troponins and awaiting cardiac cath, however, RN cleared for participation. PMH includes COPD on home O2, CAD, HTN, DM, R THA, and s/p colostomy.    OT comments  Pt. Seen for skilled OT.  Able to complete toileting in standing with urinal with min a all aspects.  Bed mobility and stand pivot with min a.  Notable SOB throughout, anxiety greatly affecting this and contributing to safety concerns as it became distracting during adl and transfer.  Max cues to stay on task.  Pt. With episodes of agitation, sadness, and high anxiety changing quickly.  Note Hospice meeting with pt. At end of session.  Will follow along as indicated with current POC.    Follow Up Recommendations  SNF    Equipment Recommendations  None recommended by OT    Recommendations for Other Services      Precautions / Restrictions Precautions Precautions: Fall       Mobility Bed Mobility Overal bed mobility: Needs Assistance Bed Mobility: Supine to Sit     Supine to sit: Min assist     General bed mobility comments: min assist for trunk elevation  Transfers Overall transfer level: Needs assistance Equipment used: None Transfers: Sit to/from [employer] Transfers Sit to Stand: Min assist Stand pivot transfers: Min assist       General transfer comment: cues for hand placement, min assist to rise and steady    Balance                                           ADL either performed or assessed with clinical judgement   ADL Overall ADL's : Needs assistance/impaired                             Toileting- Clothing Manipulation and Hygiene: Minimal assistance;Sit to/from stand Toileting - Clothing  Manipulation Details (indicate cue type and reason): pt. required assistance to stabalize and balance in standing while trying to manage urinal     Functional mobility during ADLs: Supervision/safety General ADL Comments: requiring assistance during urination.  mixed emotions greatly affecting his ability to be safe.  calm then transitioned into anger, then crying when he had some urine get on the floor, then back to angry.  then fixated on how all his lines were tangled. then transitioned into having to make a phone call right then.  difficult to redirect and calm down.  providing emotional support and attemtping to redirect      Vision       Perception     Praxis      Cognition Arousal/Alertness: Awake/alert Behavior During Therapy: Anxious                                   General Comments: anxious with difficulty staying on subject        Exercises     Shoulder Instructions       General Comments      Pertinent Vitals/ Pain       Pain  Assessment: No/denies pain  Home Living                                          Prior Functioning/Environment              Frequency  Min 2X/week        Progress Toward Goals  OT Goals(current goals can now be found in the care plan section)  Progress towards OT goals: Progressing toward goals     Plan      Co-evaluation                 AM-PAC PT "6 Clicks" Daily Activity     Outcome Measure   Help from another person eating meals?: None Help from another person taking care of personal grooming?: A Little Help from another person toileting, which includes using toliet, bedpan, or urinal?: A Little Help from another person bathing (including washing, rinsing, drying)?: A Little Help from another person to put on and taking off regular upper body clothing?: None Help from another person to put on and taking off regular lower body clothing?: A Little 6 Click Score: 20     End of Session Equipment Utilized During Treatment: Oxygen  OT Visit Diagnosis: Unsteadiness on feet (R26.81);Other abnormalities of gait and mobility (R26.89);Muscle weakness (generalized) (M62.81)   Activity Tolerance Other (comment)(anxiety and agitation limiting session)   Patient Left in chair;with call bell/phone within reach;Other (comment)(hospice representative present to meet with pt. )   Nurse Communication          Time: 2878-6767 OT Time Calculation (min): 11 min  Charges: OT General Charges $OT Visit: 1 Visit OT Treatments $Self Care/Home Management : 8-22 mins  Janice Coffin, COTA/L 04/16/2018, 1:12 PM

## 2018-04-17 DIAGNOSIS — Z7189 Other specified counseling: Secondary | ICD-10-CM

## 2018-04-17 DIAGNOSIS — Z515 Encounter for palliative care: Secondary | ICD-10-CM

## 2018-04-17 DIAGNOSIS — R0602 Shortness of breath: Secondary | ICD-10-CM

## 2018-04-17 LAB — BASIC METABOLIC PANEL WITH GFR
Anion gap: 9 (ref 5–15)
BUN: 41 mg/dL — ABNORMAL HIGH (ref 8–23)
CO2: 23 mmol/L (ref 22–32)
Calcium: 9.4 mg/dL (ref 8.9–10.3)
Chloride: 99 mmol/L (ref 98–111)
Creatinine, Ser: 1.92 mg/dL — ABNORMAL HIGH (ref 0.61–1.24)
GFR calc Af Amer: 34 mL/min — ABNORMAL LOW
GFR calc non Af Amer: 29 mL/min — ABNORMAL LOW
Glucose, Bld: 157 mg/dL — ABNORMAL HIGH (ref 70–99)
Potassium: 4 mmol/L (ref 3.5–5.1)
Sodium: 131 mmol/L — ABNORMAL LOW (ref 135–145)

## 2018-04-17 LAB — CBC
HEMATOCRIT: 27.1 % — AB (ref 39.0–52.0)
Hemoglobin: 8.2 g/dL — ABNORMAL LOW (ref 13.0–17.0)
MCH: 29.8 pg (ref 26.0–34.0)
MCHC: 30.3 g/dL (ref 30.0–36.0)
MCV: 98.5 fL (ref 80.0–100.0)
NRBC: 0 % (ref 0.0–0.2)
Platelets: 159 10*3/uL (ref 150–400)
RBC: 2.75 MIL/uL — AB (ref 4.22–5.81)
RDW: 13.9 % (ref 11.5–15.5)
WBC: 10.2 10*3/uL (ref 4.0–10.5)

## 2018-04-17 LAB — GLUCOSE, CAPILLARY
Glucose-Capillary: 120 mg/dL — ABNORMAL HIGH (ref 70–99)
Glucose-Capillary: 139 mg/dL — ABNORMAL HIGH (ref 70–99)
Glucose-Capillary: 152 mg/dL — ABNORMAL HIGH (ref 70–99)
Glucose-Capillary: 119 mg/dL — ABNORMAL HIGH (ref 70–99)

## 2018-04-17 LAB — PROTIME-INR
INR: 1.36
Prothrombin Time: 16.6 seconds — ABNORMAL HIGH (ref 11.4–15.2)

## 2018-04-17 LAB — MAGNESIUM: Magnesium: 2.2 mg/dL (ref 1.7–2.4)

## 2018-04-17 MED ORDER — GUAIFENESIN ER 600 MG PO TB12
600.0000 mg | ORAL_TABLET | Freq: Two times a day (BID) | ORAL | Status: DC | PRN
  Administered 2018-04-17: 600 mg via ORAL
  Filled 2018-04-17: qty 1

## 2018-04-17 MED ORDER — LORAZEPAM 0.5 MG PO TABS
0.5000 mg | ORAL_TABLET | ORAL | Status: DC | PRN
  Administered 2018-04-17 (×2): 0.5 mg via SUBLINGUAL
  Filled 2018-04-17 (×2): qty 1

## 2018-04-17 MED ORDER — OXYCODONE HCL 5 MG/5ML PO SOLN
5.0000 mg | ORAL | Status: DC | PRN
  Administered 2018-04-17 – 2018-04-19 (×2): 5 mg via ORAL
  Filled 2018-04-17: qty 5

## 2018-04-17 MED ORDER — GUAIFENESIN-DM 100-10 MG/5ML PO SYRP
5.0000 mL | ORAL_SOLUTION | ORAL | Status: DC | PRN
  Administered 2018-04-17 – 2018-04-18 (×3): 5 mL via ORAL
  Filled 2018-04-17 (×4): qty 5

## 2018-04-17 NOTE — Progress Notes (Signed)
[place] 2C-07 -- [facility]) RN note  Met with patient in room. He is SOB and coughing frequently. He states he is not feeling well at all today and feels he will not be living long. With that, we discussed about safety of returning home. Would he be able to care for himself, to take medicines needed for symptom management? He states "I have a lady who can come in and help with my laundry". We discussed safety of returning home alone and that hospice can follow him in whatever place can best care for him. He states that he would like for hospice to see him again and his only request for equipment is suction. I reviewed with him hospice services in depth, that we are not in the home for extended hours. He insists that he believes he can manage his care independently at home.  [facility] will continue to follow while patient in hospital and assist in any way at time of discharge if hospice services are desired.  Please call with any hospice related questions or concerns.  Thank you, Margaretmary Eddy, RN, [facility] Liaison [phone]  [place] are on AMION.

## 2018-04-17 NOTE — Plan of Care (Signed)

## 2018-04-17 NOTE — Progress Notes (Signed)
Progress Note  Patient Name: Eric [name] Date of Encounter: 04/17/2018  Primary Cardiologist: Sinclair Grooms, MD   Subjective   Resting comfortably.  Inpatient Medications    Scheduled Meds: . amiodarone  200 mg Oral BID  . apixaban  2.5 mg Oral BID  . aspirin EC  81 mg Oral Daily  . budesonide  0.25 mg Nebulization BID  . diltiazem  180 mg Oral Daily  . famotidine  40 mg Oral QHS  . fluticasone  2 spray Each Nare Daily  . guaiFENesin  600 mg Oral BID  . guaiFENesin-dextromethorphan  5 mL Oral TID  . insulin aspart  0-5 Units Subcutaneous QHS  . insulin aspart  0-9 Units Subcutaneous TID WC  . ipratropium  0.5 mg Nebulization TID  . isosorbide mononitrate  15 mg Oral Daily  . levalbuterol  0.63 mg Nebulization TID  . pravastatin  60 mg Oral q1800  . senna  2 tablet Oral Daily   Continuous Infusions:  PRN Meds: acetaminophen, alum & mag hydroxide-simeth, HYDROcodone-acetaminophen, nitroGLYCERIN, ondansetron (ZOFRAN) IV, oxyCODONE, polyvinyl alcohol   Vital Signs    Vitals:   04/17/18 0336 04/17/18 0717 04/17/18 0800 04/17/18 1118  BP: 108/79  103/79 (!) 102/47  Pulse:  70 85 71  Resp:  18 (!) 24 17  Temp: 98.3 F (36.8 C)  97.7 F (36.5 C) 98 F (36.7 C)  TempSrc: Oral  Axillary Axillary  SpO2: 97% 98% 98% 99%  Weight: 101.5 kg     Height:        Intake/Output Summary (Last 24 hours) at 04/17/2018 1137 Last data filed at 04/17/2018 0341 Gross per 24 hour  Intake 240 ml  Output 750 ml  Net -510 ml   Filed Weights   04/12/18 1116 04/16/18 0400 04/17/18 0336  Weight: 103.4 kg 104.6 kg 101.5 kg    Telemetry    A fib with PVC's and intermittent pacing - Personally Reviewed  ECG    ECG on 04/16/18 shows rate controlled atrial fibrillation - Personally Reviewed  Physical Exam   GEN: No acute distress.   Neck: No JVD Cardiac:  Regular rate, irregular rhythm, no murmurs, rubs, or gallops.  Respiratory: Poor air movement with diffuse  rhonchi and expiratory wheezes. GI: Soft, nontender, non-distended  MS: Trace bilateral lower extremity  edema; No deformity. Neuro:  Nonfocal  Psych: Normal affect   Labs    Chemistry Recent Labs  Lab 04/12/18 1135  04/14/18 0238 04/16/18 0209 04/17/18 0252  NA 139   < > 137 133* 131*  K 3.7   < > 3.9 4.0 4.0  CL 104   < > 100 99 99  CO2 26   < > 26 26 23   GLUCOSE 123*   < > 136* 136* 157*  BUN 18   < > 26* 31* 41*  CREATININE 1.34*   < > 1.63* 2.17* 1.92*  CALCIUM 9.3   < > 9.3 9.6 9.4  PROT 6.5  --   --  6.3*  --   ALBUMIN 3.7  --   --  3.6  --   AST 22  --   --  21  --   ALT 11  --   --  14  --   ALKPHOS 60  --   --  53  --   BILITOT 0.7  --   --  1.0  --   GFRNONAA 45*   < > 36* 25* 29*  GFRAA 52*   < > 41* 29* 34*  ANIONGAP 9   < > 11 8 9    < > = values in this interval not displayed.     Hematology Recent Labs  Lab 04/15/18 0225 04/16/18 0209 04/17/18 0252  WBC 10.9* 11.3* 10.2  RBC 2.99* 2.88* 2.75*  HGB 9.2* 8.6* 8.2*  HCT 29.9* 28.6* 27.1*  MCV 100.0 99.3 98.5  MCH 30.8 29.9 29.8  MCHC 30.8 30.1 30.3  RDW 13.6 13.9 13.9  PLT 153 152 159    Cardiac Enzymes Recent Labs  Lab 04/12/18 1830 04/12/18 2335 04/13/18 0816  TROPONINI 0.59* 0.64* 0.39*    Recent Labs  Lab 04/12/18 1157  TROPIPOC 0.41*     BNP Recent Labs  Lab 04/12/18 1135  BNP 483.0*     DDimer No results for input(s): DDIMER in the last 168 hours.   Radiology    No results found.  Cardiac Studies   TTE (04/12/18): LV EF: 45% - 50%  ------------------------------------------------------------------- History: PMH: Elevated Troponin Evaluation. Coronary artery disease. Chronic obstructive pulmonary disease. Risk factors: former smoker. ICM. sick sinus syndrome. Hypertension. Diabetes mellitus. Dyslipidemia.  ------------------------------------------------------------------- Study Conclusions  - Left ventricle: The cavity size was normal. Wall  thickness was increased in a pattern of mild LVH. Systolic function was mildly reduced. The estimated ejection fraction was in the range of 45% to 50%. Diffuse hypokinesis. - Aortic valve: Valve mobility was restricted. - Aortic root: The aortic root was mildly dilated. - Mitral valve: Calcified annulus.  Impressions:  - Technically difficult; LV function mildly reduced (EF 45); mild LVH; calcified aortic valve not well interrogated but no AS by doppler; mildly dilated aortic root.   Patient Profile     84 y.o. male withCAD status post CABG, sick sinus syndrome, essential hypertension, CML presenting with shortness of breath and recurrent chest pain. Found in new onset atrial fibrillation on admission along with troponin elevation 0.4.  Assessment & Plan    1.  Rapid atrial fibrillation: Heart rate is currently controlled on oral amiodarone and diltiazem.  Anticoagulated with low-dose Eliquis given age and elevated creatinine.  Given his significant COPD with diffuse wheezing, beta-blockers would not be optimal and thus calcium channel blockers are being utilized to reduce heart rate.  Currently on long-acting diltiazem 180 mg daily.   2.  Coronary artery disease/CABG: Symptomatically stable.  We will continue medical management.  Continue aspirin, Imdur and pravastatin.  Troponin elevation consistent with demand ischemia.  3.  Acute on chronic systolic heart failure: Currently on Lasix 40 mg twice daily.  I will hold as BUN is up to 41, creatinine slightly lower at 1.92.  270 cc output in last 24 hours.   4.  Tachycardia-bradycardia syndrome status post pacemaker: Stable.  5.  Chronic kidney disease stage III: BUN up to 41 and creatinine down to 1.92.  I will hold Lasix.  6.  COPD: Uses 3 L of oxygen at baseline.  Currently stable.  7.  Anemia of chronic disease: Hemoglobin is stable at 8.2.  8.  Dyslipidemia: Continue pravastatin.   For questions or  updates, please contact [place] Please consult [email or web address] for contact info under Cardiology/STEMI.      Signed, Kate Sable, MD  04/17/2018, 11:37 AM

## 2018-04-17 NOTE — Progress Notes (Signed)
2.5 mL of oxycodone solution was originally ordered for shortness of breath and cough. Order was changed by provider to 5 mL after removal from pyxis. Pt was given 5 mL's, pyxis still showing undocumented waste. Pt was given full 5mg /76mL solution.

## 2018-04-17 NOTE — Progress Notes (Signed)
Daily Progress Note   Patient Name: Eric [name]       Date: 04/17/2018 DOB: 1926/10/18  Age: 84 y.o. MRN#: [id number] Attending Physician: Cherene Altes, MD Primary Care Physician: [name], MD Admit Date: 04/12/2018  Reason for Consultation/Follow-up: Establishing goals of care  Subjective: Patient in bed, SOB. Using suction. Tells me he feels "rough". RN at bedside. Discussed trailing oxycodone for SOB and cough. Discussed trying medication for anxiety.  Mr. Mccahill then states, "I'm dying aren't I?". Discussed with him that is concern- we discussed he has illnesses that cannot be reversed, but can treat symptomatically. He states- "I just want to go home". He notes that his ostomy is uncomfortable- feels he can do it better himself.  He states he did accept Hospice assistance today.   ROS  Length of Stay: 5  Current Medications: Scheduled Meds:  . amiodarone  200 mg Oral BID  . apixaban  2.5 mg Oral BID  . aspirin EC  81 mg Oral Daily  . budesonide  0.25 mg Nebulization BID  . diltiazem  180 mg Oral Daily  . famotidine  40 mg Oral QHS  . fluticasone  2 spray Each Nare Daily  . insulin aspart  0-5 Units Subcutaneous QHS  . insulin aspart  0-9 Units Subcutaneous TID WC  . ipratropium  0.5 mg Nebulization TID  . isosorbide mononitrate  15 mg Oral Daily  . levalbuterol  0.63 mg Nebulization TID  . pravastatin  60 mg Oral q1800  . senna  2 tablet Oral Daily    Continuous Infusions:   PRN Meds: acetaminophen, alum & mag hydroxide-simeth, guaiFENesin, guaiFENesin-dextromethorphan, HYDROcodone-acetaminophen, nitroGLYCERIN, ondansetron (ZOFRAN) IV, oxyCODONE, polyvinyl alcohol  Physical Exam          Vital Signs: BP (!) 126/47 (BP Location: Left Arm)    Pulse 77   Temp 98.4 F (36.9 C) (Oral)   Resp (!) 28   Ht 5\' 6"  (1.676 m)   Wt 101.5 kg   SpO2 100%   BMI 36.11 kg/m  SpO2: SpO2: 100 % O2 Device: O2 Device: Nasal Cannula O2 Flow Rate: O2 Flow Rate (L/min): 3 L/min  Intake/output summary:   Intake/Output Summary (Last 24 hours) at 04/17/2018 1556 Last data filed at 04/17/2018 1200 Gross per 24 hour  Intake -  Output 900 ml  Net -900 ml   LBM: Last BM Date: 04/17/18 Baseline Weight: Weight: 103.4 kg Most recent weight: Weight: 101.5 kg       Palliative Assessment/Data: PPS: 40%      Patient Active Problem List   Diagnosis Date Noted  . Acute on chronic systolic heart failure ([place])   . New onset a-fib ([place])   . Demand ischemia of myocardium ([place])   . Acute renal failure superimposed on stage 3 chronic kidney disease ([place])   . Elevated troponin 04/13/2018  . Atrial fibrillation with RVR ([place]) 04/12/2018  . Erosion of penile prosthesis ([place]) 04/22/2017  . Bilateral pleural effusion 10/12/2016  . Chronic respiratory failure with hypoxia ([place]) 10/12/2016  . Goals of care, counseling/discussion 08/04/2016  . Anxiety state 03-09-202018  . Anemia in neoplastic disease 12/12/2015  . Acquired pancytopenia ([place]) 10/28/2015  . Anemia of chronic disease 07/18/2015  . Tachy-brady syndrome ([place]) 07/10/2015  . Bilateral carotid artery stenosis 07/10/2015  . Essential hypertension 07/10/2015  . CML (chronic myeloid leukemia) ([place]) 06/26/2015  . Myeloproliferative neoplasm ([place]) 06/25/2015  . Back pain 12/10/2014  . Obesity (BMI 30-39.9) 12/08/2014  . Hartmann's pouch of intestine ([place]) 12/08/2014  . CAD (coronary artery disease) of artery bypass graft 11/28/2014  . Chronic diastolic heart failure ([place]) 11/24/2014  . GERD (gastroesophageal reflux disease) 03/27/2014  . Generalized weakness 02/08/2014  . Obstructive sleep apnea 04/11/2011  . Type 2 diabetes mellitus, controlled ([place]) 01/18/2009  . Dyslipidemia 01/18/2009    . Hypertensive heart disease with CHF ([place]) 01/18/2009  . Coronary atherosclerosis 01/18/2009  . GERD 01/18/2009  . COPD mixed type ([place]) 12/27/2006    Palliative Care Assessment & Plan   Patient Profile: 84 y.o. male  with past medical history of COPD, CML, CKD, aortic sclerosis on 3L O2 at home, tachy-brady syndrome s/p pacemaker placment, HTN, s/p colostomy admitted on 04/12/2018 with dyspnea, tachycardia, chest pain, fatigue. Workup revealed new onset a-fib with RVR and possibly ischemia with elevated troponin. Patient has been on Hospice in the past and verbalized to care providers he wanted to go home with Hospice. Palliative consulted for further delineation of GOC.   Assessment/Recommendations/Plan   Patient has accepted Hospice assistance at his independent senior living facility  His main [place] is to return to his home- he has capacity and it is within his right to make a decision that we may not agree with- he feels confident he can remain safe and more comfortable in his home than anywhere- if he was to go to SNF it would be through rehab without Hospice- patient feels he is at end of life, is not interested in rehab, and would not benefit from rehab- doesn't have long term care so SNF with Hospice is not option- so returning to his residence with Hospice support may be best and will meet his GOC for the time of life that he has left  Encouraged use of oxycodone for SOB, cough  Start lorazepam .5mg  sublingual for anxiety  PMT will follow and address disposition if patient's status declines further and residential Hospice becomes a better option  Goals of Care and Additional Recommendations:  Limitations on Scope of Treatment: Avoid Hospitalization and Minimize Medications  Code Status:  DNR  Prognosis:   < 6 months d/t CHF, a-fib, CKD, COPD, increasing SOB with ADL's, decreasing functional status  Discharge Planning:  Home with Hospice  Care plan was discussed with  patient.  Thank you for allowing the Palliative  Medicine Team to assist in the care of this patient.   Time In: 1515 Time Out: 1600 Total Time 45 minutes Prolonged Time Billed no      Greater than 50%  of this time was spent counseling and coordinating care related to the above assessment and plan.  Mariana Kaufman, AGNP-C Palliative Medicine   Please contact Palliative Medicine Team phone at [phone] for questions and concerns.

## 2018-04-17 NOTE — Progress Notes (Signed)
TEAM 1 - Stepdown/ICU TEAM  Eric [name]  TDD:[id number] DOB: Jul 30, 1926 DOA: 04/12/2018 PCP: [name], MD    Brief Narrative:  84 y.o.malewith a hx of CAD, Aortic sclerosis, ischemic cardiomyopathy, COPDon 3L at home, OSA, tachy-brady syndromes/p PPM,HTN, CML, and prostate CA who presentedwith2-3 days of dyspnea, tachycardia, and chest pain.   In the ED he was found to be in ST vs atrial ectopic tachycardia, w/ Twave inversions in V1 and aVr. Troponin elevation to 0.41.Chest x-ray showed chronic emphysematous changes.After metoprolol, HR slowed to ~110 and EKG showed afib.  Subjective: Tells me he "feels terrible" today. Aching "all over." Denies SSCP, but says he is sob. Appears quite anxious. Continues to insist that he is going to his ILF. Despite having essentially sent Hospice away yesterday, he tells me today he can go home because Hospice will help him.   Assessment & Plan:  Newly diagnosed Atrial fibrillation with RVR  CHA2DS2-VASc is 4 - now on Amiodarone and Eliquis - stabilizing clinically - cont to ambulate - cont current medical tx   Elevated troponin - CAD s/p CABG C/w rate related demand ischemia - Cards following - asymptomatic at this time   Systolic CHF  EF 62-37% w/ diffuse hypokinesis - weight has improved - net negative ~10108ml since admit - diuretic on hold for now due to worsening renal fxn    Filed Weights   04/12/18 1116 04/16/18 0400 04/17/18 0336  Weight: 103.4 kg 104.6 kg 101.5 kg    CKD 3 baseline crt 1.0-1.3 - crt may now be improving - cont to hold diuretic   Recent Labs  Lab 04/12/18 1135 04/13/18 0245 04/14/18 0238 04/16/18 0209 04/17/18 0252  CREATININE 1.34* 1.58* 1.63* 2.17* 1.92*    Anxiety does not want to take Xanax which he has tried in the past - appears to be contributing to his discomfort at this time   COPD - Chronic respiratory failure with hypoxia   3 L O2 at baseline - appears his  resp status is quite fragile day-to-day - presently stable on home O2 dose but seriously doubt he will be able to fend for himself in an ILF  GERD Cont medical tx   Obstructive sleep apnea CPAP   Obesity - Body mass index is 36.11 kg/m.  CML (chronic myeloid leukemia)    Anemia of chronic disease  Hgb relatively stable - no evidence of blood loss    DM2 CBG controlled  Dyslipidemia cont Pravastatin  Goals of Care  Pt strongly desires to continue to live idependently, but does not appear to grasp that this is not safe   DVT prophylaxis: Eliqius  Code Status: DNR - NO CODE Family Communication: no family present at time of exam  Disposition Plan: desires return to ILF, but clearly needs higher level of care/24hr assist   Consultants:  Cardiology Hospice  Palliative Care   Antimicrobials:  none  Objective: Blood pressure (!) 102/47, pulse 71, temperature 98 F (36.7 C), temperature source Axillary, resp. rate 17, height 5\' 6"  (1.676 m), weight 101.5 kg, SpO2 99 %.  Intake/Output Summary (Last 24 hours) at 04/17/2018 1156 Last data filed at 04/17/2018 0341 Gross per 24 hour  Intake 240 ml  Output 750 ml  Net -510 ml   Filed Weights   04/12/18 1116 04/16/18 0400 04/17/18 0336  Weight: 103.4 kg 104.6 kg 101.5 kg    Examination: General: anxious - tachypneic though sats stable   Lungs: course crackles th/o  all fields w/o change  Cardiovascular: irreg irreg - rate controlled  Abdomen: NT/ND, soft, bowel sounds positive, no rebound Extremities: 1+ B LE edema w/o change   CBC: Recent Labs  Lab 04/12/18 1135  04/15/18 0225 04/16/18 0209 04/17/18 0252  WBC 6.2   < > 10.9* 11.3* 10.2  NEUTROABS 4.8  --   --   --   --   HGB 10.3*   < > 9.2* 8.6* 8.2*  HCT 34.7*   < > 29.9* 28.6* 27.1*  MCV 100.6*   < > 100.0 99.3 98.5  PLT 199   < > 153 152 159   < > = values in this interval not displayed.   Basic Metabolic Panel: Recent Labs  Lab 04/14/18 0238  04/16/18 0209 04/17/18 0252  NA 137 133* 131*  K 3.9 4.0 4.0  CL 100 99 99  CO2 26 26 23   GLUCOSE 136* 136* 157*  BUN 26* 31* 41*  CREATININE 1.63* 2.17* 1.92*  CALCIUM 9.3 9.6 9.4  MG  --   --  2.2   GFR: Estimated Creatinine Clearance: 29.1 mL/min (A) (by C-G formula based on SCr of 1.92 mg/dL (H)).  Liver Function Tests: Recent Labs  Lab 04/12/18 1135 04/16/18 0209  AST 22 21  ALT 11 14  ALKPHOS 60 53  BILITOT 0.7 1.0  PROT 6.5 6.3*  ALBUMIN 3.7 3.6    Coagulation Profile: Recent Labs  Lab 04/13/18 0245 04/14/18 0238 04/15/18 0225 04/16/18 0209 04/17/18 0252  INR 1.11 1.10 1.14 1.29 1.36    Cardiac Enzymes: Recent Labs  Lab 04/12/18 1830 04/12/18 2335 04/13/18 0816  TROPONINI 0.59* 0.64* 0.39*    HbA1C: Hgb A1c MFr Bld  Date/Time Value Ref Range Status  01/11/2018 02:39 PM 6.6 (H) 4.6 - 6.5 % Final    Comment:    Glycemic Control Guidelines for People with Diabetes:Non Diabetic:  <6%Goal of Therapy: <7%Additional Action Suggested:  >8%   04/16/2017 02:31 PM 6.5 (H) 4.8 - 5.6 % Final    Comment:    (NOTE) Pre diabetes:          5.7%-6.4% Diabetes:              >6.4% Glycemic control for   <7.0% adults with diabetes     CBG: Recent Labs  Lab 04/16/18 0803 04/16/18 1248 04/16/18 1719 04/16/18 2122 04/17/18 0852  GLUCAP 122* 127* 130* 149* 120*    Recent Results (from the past 240 hour(s))  MRSA PCR Screening     Status: None   Collection Time: 04/14/18 11:48 AM  Result Value Ref Range Status   MRSA by PCR NEGATIVE NEGATIVE Final    Comment:        The GeneXpert MRSA Assay (FDA approved for NASAL specimens only), is one component of a comprehensive MRSA colonization surveillance program. It is not intended to diagnose MRSA infection nor to guide or monitor treatment for MRSA infections. Performed at [facility], [place] [address], [place], [place] [address]      Scheduled Meds: . amiodarone  200 mg Oral BID  .  apixaban  2.5 mg Oral BID  . aspirin EC  81 mg Oral Daily  . budesonide  0.25 mg Nebulization BID  . diltiazem  180 mg Oral Daily  . famotidine  40 mg Oral QHS  . fluticasone  2 spray Each Nare Daily  . guaiFENesin  600 mg Oral BID  . guaiFENesin-dextromethorphan  5 mL Oral TID  .  insulin aspart  0-5 Units Subcutaneous QHS  . insulin aspart  0-9 Units Subcutaneous TID WC  . ipratropium  0.5 mg Nebulization TID  . isosorbide mononitrate  15 mg Oral Daily  . levalbuterol  0.63 mg Nebulization TID  . pravastatin  60 mg Oral q1800  . senna  2 tablet Oral Daily     LOS: 5 days   Cherene Altes, MD [facility] Office  [phone] Pager - Text Page per Amion  If 7PM-7AM, please contact night-coverage per Amion 04/17/2018, 11:56 AM

## 2018-04-18 ENCOUNTER — Telehealth: Payer: Self-pay

## 2018-04-18 LAB — BASIC METABOLIC PANEL
ANION GAP: 7 (ref 5–15)
BUN: 35 mg/dL — AB (ref 8–23)
CO2: 27 mmol/L (ref 22–32)
Calcium: 9.7 mg/dL (ref 8.9–10.3)
Chloride: 99 mmol/L (ref 98–111)
Creatinine, Ser: 1.51 mg/dL — ABNORMAL HIGH (ref 0.61–1.24)
GFR calc Af Amer: 45 mL/min — ABNORMAL LOW (ref >60)
GFR, EST NON AFRICAN AMERICAN: 39 mL/min — AB (ref >60)
GLUCOSE: 145 mg/dL — AB (ref 70–99)
Potassium: 4.3 mmol/L (ref 3.5–5.1)
Sodium: 133 mmol/L — ABNORMAL LOW (ref 135–145)

## 2018-04-18 LAB — CBC
HCT: 27.9 % — ABNORMAL LOW (ref 39.0–52.0)
Hemoglobin: 8.8 g/dL — ABNORMAL LOW (ref 13.0–17.0)
MCH: 31.1 pg (ref 26.0–34.0)
MCHC: 31.5 g/dL (ref 30.0–36.0)
MCV: 98.6 fL (ref 80.0–100.0)
PLATELETS: 161 10*3/uL (ref 150–400)
RBC: 2.83 MIL/uL — ABNORMAL LOW (ref 4.22–5.81)
RDW: 13.7 % (ref 11.5–15.5)
WBC: 10.5 10*3/uL (ref 4.0–10.5)
nRBC: 0 % (ref 0.0–0.2)

## 2018-04-18 LAB — GLUCOSE, CAPILLARY
Glucose-Capillary: 113 mg/dL — ABNORMAL HIGH (ref 70–99)
Glucose-Capillary: 123 mg/dL — ABNORMAL HIGH (ref 70–99)
Glucose-Capillary: 132 mg/dL — ABNORMAL HIGH (ref 70–99)
Glucose-Capillary: 143 mg/dL — ABNORMAL HIGH (ref 70–99)

## 2018-04-18 LAB — PROTIME-INR
INR: 1.31
Prothrombin Time: 16.2 seconds — ABNORMAL HIGH (ref 11.4–15.2)

## 2018-04-18 MED ORDER — FUROSEMIDE 20 MG PO TABS
20.0000 mg | ORAL_TABLET | Freq: Two times a day (BID) | ORAL | Status: DC
  Administered 2018-04-18 – 2018-04-20 (×3): 20 mg via ORAL
  Filled 2018-04-18 (×4): qty 1

## 2018-04-18 NOTE — Progress Notes (Addendum)
Progress Note  Patient Name: Eric [name] Date of Encounter: 04/18/2018  Primary Cardiologist: Sinclair Grooms, MD   Subjective   Patient appears to be more irritable today. He mentioned that his right nare is hurting him after using his cpap machine last night. The patient denies chest pain, pnd,  or dyspnea.   Inpatient Medications    Scheduled Meds: . amiodarone  200 mg Oral BID  . apixaban  2.5 mg Oral BID  . aspirin EC  81 mg Oral Daily  . budesonide  0.25 mg Nebulization BID  . diltiazem  180 mg Oral Daily  . famotidine  40 mg Oral QHS  . fluticasone  2 spray Each Nare Daily  . insulin aspart  0-5 Units Subcutaneous QHS  . insulin aspart  0-9 Units Subcutaneous TID WC  . ipratropium  0.5 mg Nebulization TID  . isosorbide mononitrate  15 mg Oral Daily  . levalbuterol  0.63 mg Nebulization TID  . pravastatin  60 mg Oral q1800  . senna  2 tablet Oral Daily   Continuous Infusions:  PRN Meds: acetaminophen, alum & mag hydroxide-simeth, guaiFENesin, guaiFENesin-dextromethorphan, HYDROcodone-acetaminophen, LORazepam, nitroGLYCERIN, ondansetron (ZOFRAN) IV, oxyCODONE, polyvinyl alcohol   Vital Signs    Vitals:   04/17/18 2300 04/18/18 0500 04/18/18 0740 04/18/18 0742  BP: (!) 107/54     Pulse: 71     Resp: 13     Temp: 98.4 F (36.9 C)     TempSrc: Axillary     SpO2: 99%  99% 95%  Weight:  104.3 kg    Height:        Intake/Output Summary (Last 24 hours) at 04/18/2018 0801 Last data filed at 04/17/2018 1800 Gross per 24 hour  Intake 720 ml  Output 800 ml  Net -80 ml   Filed Weights   04/16/18 0400 04/17/18 0336 04/18/18 0500  Weight: 104.6 kg 101.5 kg 104.3 kg    Telemetry    Atrial fibrillation with normal rhythm - Personally Reviewed  ECG    None today 11/11   Physical Exam  Physical Exam  Constitutional: He appears well-developed and well-nourished.  HENT:  Head: Normocephalic and atraumatic.  Eyes: Conjunctivae are normal.    Cardiovascular: Normal rate. An irregularly irregular rhythm present.  Respiratory: Effort normal. No respiratory distress. He has no wheezes.  Diffuse rhonchi, on 3L via [place]. External right nare with mild amount of erythema  GI: Soft. Bowel sounds are normal. He exhibits no distension. There is no tenderness.  Musculoskeletal: He exhibits no edema.  Neurological: He is alert.  Skin: No erythema.  Psychiatric: He has a normal mood and affect. His behavior is normal. Judgment and thought content normal.    Labs    Chemistry Recent Labs  Lab 04/12/18 1135  04/16/18 0209 04/17/18 0252 04/18/18 0227  NA 139   < > 133* 131* 133*  K 3.7   < > 4.0 4.0 4.3  CL 104   < > 99 99 99  CO2 26   < > 26 23 27   GLUCOSE 123*   < > 136* 157* 145*  BUN 18   < > 31* 41* 35*  CREATININE 1.34*   < > 2.17* 1.92* 1.51*  CALCIUM 9.3   < > 9.6 9.4 9.7  PROT 6.5  --  6.3*  --   --   ALBUMIN 3.7  --  3.6  --   --   AST 22  --  21  --   --  ALT 11  --  14  --   --   ALKPHOS 60  --  53  --   --   BILITOT 0.7  --  1.0  --   --   GFRNONAA 45*   < > 25* 29* 39*  GFRAA 52*   < > 29* 34* 45*  ANIONGAP 9   < > 8 9 7    < > = values in this interval not displayed.     Hematology Recent Labs  Lab 04/16/18 0209 04/17/18 0252 04/18/18 0227  WBC 11.3* 10.2 10.5  RBC 2.88* 2.75* 2.83*  HGB 8.6* 8.2* 8.8*  HCT 28.6* 27.1* 27.9*  MCV 99.3 98.5 98.6  MCH 29.9 29.8 31.1  MCHC 30.1 30.3 31.5  RDW 13.9 13.9 13.7  PLT 152 159 161    Cardiac Enzymes Recent Labs  Lab 04/12/18 1830 04/12/18 2335 04/13/18 0816  TROPONINI 0.59* 0.64* 0.39*    Recent Labs  Lab 04/12/18 1157  TROPIPOC 0.41*     BNP Recent Labs  Lab 04/12/18 1135  BNP 483.0*     DDimer No results for input(s): DDIMER in the last 168 hours.   Radiology    No results found.  Cardiac Studies   TTE (04/12/18): LV EF: 45% - 50%  ------------------------------------------------------------------- History: PMH: Elevated  Troponin Evaluation. Coronary artery disease. Chronic obstructive pulmonary disease. Risk factors: former smoker. ICM. sick sinus syndrome. Hypertension. Diabetes mellitus. Dyslipidemia.  ------------------------------------------------------------------- Study Conclusions  - Left ventricle: The cavity size was normal. Wall thickness was increased in a pattern of mild LVH. Systolic function was mildly reduced. The estimated ejection fraction was in the range of 45% to 50%. Diffuse hypokinesis. - Aortic valve: Valve mobility was restricted. - Aortic root: The aortic root was mildly dilated. - Mitral valve: Calcified annulus.  Impressions:  - Technically difficult; LV function mildly reduced (EF 45); mild LVH; calcified aortic valve not well interrogated but no AS by doppler; mildly dilated aortic root.   Patient Profile     84 y.o. male CAD status post CABG, sick sinus syndrome s/p ppm, essential hypertension, CML presenting with shortness of breath and recurrent chest pain. Found in new onset atrial fibrillation on admission along with troponin elevation 0.4.  Assessment & Plan    Atrial fibrillation Atrial rhythm with normal rates (50-70s). CHA2DS2-VASc score of 4. Will recommend continuing amiodarone and diltiazem (beta blocker contraindicated given copd).  -continue po amiodarone 200mg  bid  -continue po diltiazem 180mg  qd -continue Eliquis  CAD s/p CABG 1995 Troponin elevation on admission was due to demand ischemia.   -continue aspirin  81mg  -ContinueImdur30 mg daily -Nitroglycerin 0.4 mg sublingual every 5 minutes as needed -continue home pravastatin 60mg  qd  HFrEF Echo with EF 45 to 50% which is decreased from 55 to 65% in 2017.Put out 888ml over the past 24 hrs. 229lbs from 223lbs yesterday.  Lasix held due to renal function. Renal function improving today with gfr of 39, cr 1.51 from 1.92 (Baseline 1.0-1.5).   -Record daily weights and  I/O  Essential hypertension The patient's blood pressure has been ranging 100-120/40-70s  -Holding home spironolactone 12.5 mg daily due to soft blood pressures  COPD The patient has copd mixed type for which he is being followe by Dr. Annamaria Boots. Chronically on 3L oxygen at home which he is being continued on here in hospital.  -Levalbuterol and ipratropium -Budesonide 0.25 mg twice daily -Flonase -Flutter valve    Goals of care:  Palliative care following  patient. He wishes to be DNR, would like to be discharged and live independently. Patient does not wish his family to be involved in decision making.   -oxycodone 2.5mg  prn for sob and cough  -DNR order -Home hospice -Lorazepam 0.5mg  SL for anxiety     For questions or updates, please contact [place] Please consult [email or web address] for contact info under        Signed, Lars Mage, MD  04/18/2018, 8:01 AM

## 2018-04-18 NOTE — Plan of Care (Signed)

## 2018-04-18 NOTE — Telephone Encounter (Signed)
Copied from [place] [id number]. Topic: General - Other >> Apr 18, 2018  1:28 PM Bea Graff, NT wrote: Reason for CRM: Amber with [facility] calling to verify Dr. Sharlet Salina will be the attending of record for this pt. CB#: [phone].

## 2018-04-18 NOTE — Telephone Encounter (Signed)
Fine

## 2018-04-18 NOTE — Progress Notes (Signed)
Occupational Therapy Treatment Patient Details Name: Eric [name] MRN: [id number] DOB: Sep 04, 1926 Today's Date: 04/18/2018    History of present illness Pt is an 84 y/o male admitted secondary to tachycardia and SOB. Found to have a fib with RVR. Also with elevated troponins and awaiting cardiac cath, however, RN cleared for participation. PMH includes COPD on home O2, CAD, HTN, DM, R THA, and s/p colostomy.    OT comments  Pt progressing towards established OT goals. Pt presenting with wet cough at beginning of session. Pt participating in hallway distance functional mobility with RW and Min A. Pt with urinary incontinence during mobility. Pt requiring Min A for toileting to manage urinal and maintain standing balance.  HR elevating to 126 during activity and SpO2 in 90s on 3L O2. Continue to recommend post-acute rehab and will continue to follow acutely as admitted.    Follow Up Recommendations  SNF    Equipment Recommendations  None recommended by OT    Recommendations for Other Services      Precautions / Restrictions Precautions Precautions: Fall Restrictions Weight Bearing Restrictions: No       Mobility Bed Mobility Overal bed mobility: Needs Assistance Bed Mobility: Supine to Sit     Supine to sit: Min assist     General bed mobility comments: Min A for pt to pull into upright position and for trunk control  Transfers Overall transfer level: Needs assistance Equipment used: Rolling walker (2 wheeled) Transfers: Sit to/from [facility] Sit to Stand: Min assist         General transfer comment: Min A to power up into standing and then gain balance in standing    Balance Overall balance assessment: Needs assistance Sitting-balance support: No upper extremity supported;Feet supported Sitting balance-Leahy Scale: Fair     Standing balance support: Bilateral upper extremity supported;During functional activity Standing balance-Leahy  Scale: Poor Standing balance comment: Reliant on BUE support.                            ADL either performed or assessed with clinical judgement   ADL Overall ADL's : Needs assistance/impaired     Grooming: Wash/dry hands;Supervision/safety;Sitting Grooming Details (indicate cue type and reason): hand hygiene seated with supervision               Lower Body Dressing Details (indicate cue type and reason): Requesting asisstance for adjusting socks and reporting difficulty bending forward Toilet Transfer: Minimal assistance;RW;Ambulation(simulated to recliner) Toilet Transfer Details (indicate cue type and reason): Min A for balance and safety Toileting- Clothing Manipulation and Hygiene: Minimal assistance;Sit to/from stand Toileting - Clothing Manipulation Details (indicate cue type and reason): Min A for managing urinal and maintaining balance     Functional mobility during ADLs: Minimal assistance;Rolling walker General ADL Comments: Pt with decreased balance and strength.      Vision       Perception     Praxis      Cognition Arousal/Alertness: Awake/alert Behavior During Therapy: Anxious Overall Cognitive Status: No family/caregiver present to determine baseline cognitive functioning                                 General Comments: Pt anxious about his functional decline. Requiring increased encouragement.        Exercises     Shoulder Instructions       General Comments HR  elevating to 126 during activity. SpO2 dropping to 86 on RA with pt blowing his nose. SpO2 staying in 90s on 3L O2    Pertinent Vitals/ Pain       Pain Assessment: Faces Faces Pain Scale: Hurts little more Pain Location: BLEs Pain Descriptors / Indicators: Constant;Discomfort;Grimacing Pain Intervention(s): Limited activity within patient's tolerance;Monitored during session;Repositioned  Home Living                                           Prior Functioning/Environment              Frequency  Min 2X/week        Progress Toward Goals  OT Goals(current goals can now be found in the care plan section)  Progress towards OT goals: Progressing toward goals  Acute Rehab OT Goals Patient Stated Goal: to go home and have hospice come   OT Goal Formulation: With patient Time For Goal Achievement: 04/28/18 Potential to Achieve Goals: Fair ADL Goals Pt Will Perform Grooming: with modified independence;standing Pt Will Perform Lower Body Bathing: with modified independence;sit to/from stand Pt Will Perform Lower Body Dressing: with modified independence;sit to/from stand Pt Will Transfer to Toilet: with modified independence;ambulating Pt Will Perform Toileting - Clothing Manipulation and hygiene: with modified independence;sit to/from stand Additional ADL Goal #1: Pt will state at least 3 energy conservation strategies as instructed.  Plan Discharge plan remains appropriate    Co-evaluation                 AM-PAC PT "6 Clicks" Daily Activity     Outcome Measure   Help from another person eating meals?: None Help from another person taking care of personal grooming?: A Little Help from another person toileting, which includes using toliet, bedpan, or urinal?: A Little Help from another person bathing (including washing, rinsing, drying)?: A Little Help from another person to put on and taking off regular upper body clothing?: None Help from another person to put on and taking off regular lower body clothing?: A Little 6 Click Score: 20    End of Session Equipment Utilized During Treatment: Oxygen;Gait belt;Rolling walker  OT Visit Diagnosis: Unsteadiness on feet (R26.81);Other abnormalities of gait and mobility (R26.89);Muscle weakness (generalized) (M62.81)   Activity Tolerance Patient tolerated treatment well   Patient Left in chair;with call bell/phone within reach;with chair alarm set   Nurse  Communication Mobility status        Time: 6387-5643 OT Time Calculation (min): 27 min  Charges: OT General Charges $OT Visit: 1 Visit OT Treatments $Self Care/Home Management : 8-22 mins  [place], OTR/L Acute Rehab Pager: [phone] Office: [place] 04/18/2018, 4:26 PM

## 2018-04-18 NOTE — Progress Notes (Signed)
Physical Therapy Treatment Patient Details Name: Eric [name] MRN: [id number] DOB: [birth year] Today's Date: 04/18/2018    History of Present Illness Pt is an 84 y/o male admitted secondary to tachycardia and SOB. Found to have a fib with RVR. Also with elevated troponins and awaiting cardiac cath, however, RN cleared for participation. PMH includes COPD on home O2, CAD, HTN, DM, R THA, and s/p colostomy.     PT Comments    Patient seen for activity progression. Tolerated 2 bouts of ambulation with RW and increased rest breaks and encouragement. HR elevated 120s with saturations in the 90s on 3 liters. At this time, current POC remains appropriate.   Follow Up Recommendations  Supervision/Assistance - 24 hour;SNF;Other (comment)(wants to go home with hospice)     Equipment Recommendations  None recommended by PT    Recommendations for Other Services OT consult     Precautions / Restrictions Precautions Precautions: Fall Restrictions Weight Bearing Restrictions: No    Mobility  Bed Mobility Overal bed mobility: Needs Assistance Bed Mobility: Supine to Sit     Supine to sit: Min assist     General bed mobility comments: Min A for pt to pull into upright position and for trunk control  Transfers Overall transfer level: Needs assistance Equipment used: Rolling walker (2 wheeled) Transfers: Sit to/from [facility] Sit to Stand: Min assist         General transfer comment: Min A to power up into standing and then gain balance in standing  Ambulation/Gait Ambulation/Gait assistance: Min guard Gait Distance (Feet): 90 Feet(x2 with standing rest break) Assistive device: Rolling walker (2 wheeled) Gait Pattern/deviations: Step-through pattern;Decreased stride length;Trunk flexed Gait velocity: Decreased  Gait velocity interpretation: <1.31 ft/sec, indicative of household ambulator General Gait Details: Slow cadence and fatigues easily. HR 95-120s,  SpO2 > 90% on 3L O2.   Stairs             Wheelchair Mobility    Modified Rankin (Stroke Patients Only)       Balance Overall balance assessment: Needs assistance Sitting-balance support: No upper extremity supported;Feet supported Sitting balance-Leahy Scale: Fair     Standing balance support: Bilateral upper extremity supported;During functional activity Standing balance-Leahy Scale: Poor Standing balance comment: Reliant on BUE support.                             Cognition Arousal/Alertness: Awake/alert Behavior During Therapy: Anxious Overall Cognitive Status: No family/caregiver present to determine baseline cognitive functioning                                 General Comments: Pt anxious about his functional decline. Requiring increased encouragement.      Exercises      General Comments General comments (skin integrity, edema, etc.): HR elevating to 126 during activity. SpO2 dropping to 86 on RA with pt blowing his nose. SpO2 staying in 90s on 3L O2      Pertinent Vitals/Pain Pain Assessment: Faces Faces Pain Scale: Hurts little more Pain Location: BLEs Pain Descriptors / Indicators: Constant;Discomfort;Grimacing Pain Intervention(s): Limited activity within patient's tolerance;Monitored during session;Repositioned    Home Living                      Prior Function            PT Goals (current goals can now  be found in the care plan section) Acute Rehab PT Goals Patient Stated Goal: to go home and have hospice come   PT Goal Formulation: With patient Time For Goal Achievement: 04/27/18 Potential to Achieve Goals: Fair Progress towards PT goals: Progressing toward goals    Frequency    Min 3X/week      PT Plan Current plan remains appropriate    Co-evaluation PT/OT/SLP Co-Evaluation/Treatment: (dove tailed for ambulation and safety)            AM-PAC PT "6 Clicks" Daily Activity  Outcome  Measure  Difficulty turning over in bed (including adjusting bedclothes, sheets and blankets)?: A Little Difficulty moving from lying on back to sitting on the side of the bed? : Unable Difficulty sitting down on and standing up from a chair with arms (e.g., wheelchair, bedside commode, etc,.)?: Unable Help needed moving to and from a bed to chair (including a wheelchair)?: A Little Help needed walking in hospital room?: A Little Help needed climbing 3-5 steps with a railing? : A Lot 6 Click Score: 13    End of Session Equipment Utilized During Treatment: Gait belt;Oxygen Activity Tolerance: Other (comment)(anxiety) Patient left: in chair;with nursing/sitter in room;with call bell/phone within reach Nurse Communication: Mobility status;Other (comment) PT Visit Diagnosis: Unsteadiness on feet (R26.81);Muscle weakness (generalized) (M62.81)     Time: 1308-6578 PT Time Calculation (min) (ACUTE ONLY): 14 min  Charges:  $Gait Training: 8-22 mins                     Alben Deeds, PT DPT  Board Certified Neurologic Specialist [place] Pager [phone] Office [place] 04/18/2018, 4:45 PM

## 2018-04-18 NOTE — Progress Notes (Signed)
Hospice and [facility]) RN note.  Received referral from [facility] on 04/14/18 for possible Home with Hospice services upon discharge from hospital. Chart and patient information reviewed by [facility] Physician. Patient will need a face to face visit with [facility] physician in order to recertify for hospice benefit. This is scheduled for 04/20/2018 at 14:30.    [facility] will continue to follow while hospitalized and be available as needed during this referral.  Thank you,  Farrel Gordon, RN, [place] [facility] Liaison (listed on [place]) [phone]

## 2018-04-18 NOTE — Telephone Encounter (Signed)
Amber informed that Dr. Sharlet Salina will be the attending

## 2018-04-18 NOTE — Care Management Note (Signed)
Case Management Note  Patient Details  Name: Eric [name] MRN: [id number] Date of Birth: Jul 18, 1926  Subjective/Objective:                    Action/Plan:  PTA independent from home alone. Pt has colostomy and informed CM that he managed it independently at home.   Pt is on oxygen 2 liters continious supplied by [facility] - pt has portable oxygen tank for transport but unfortunately it is at home and pt doesn't have support system to retreive it at discharge - pt may need transport home at discharge - CSW consulted.  Pt informed CM that he is interested in discharging home with [facility] - CM contacted [facility] and made referral.  Attending gave CM verbal referral for home with hospice.  Pt informed CM that he is able to care for himself at home and has all the equipment that he needs in the home.   Expected Discharge Date:                  Expected Discharge Plan:  Home w Hospice Care  In-House Referral:  Clinical Social Work  Discharge planning Services  CM Consult  Post Acute Care Choice:    Choice offered to:     DME Arranged:    DME Agency:     HH Arranged:    [place] Agency:     Status of Service:  In process, will continue to follow  If discussed at Long Length of Stay Meetings, dates discussed:    Additional Comments: 04/18/2018  Pt in agreement with [facility] to intiate services at discharge.  CM just informed that [facility] will have to have an agency attending to perform face to face with pt before the referral can be approved (earliest appt available 11/13 at 2:30pm).  CM informed attending  11/7/1  [facility] to follow up with CM to inform if pt is accepted into program post assessment.  [facility] may not pay for Eliquis - resource to follow up with CM.   CM informed pt of copay as documented in Epic - pt can not afford copay.  CM to follow up with attending once determination is made by hospice with coverage.   Eric Labrador, RN 04/18/2018, 3:13 PM

## 2018-04-18 NOTE — Progress Notes (Signed)
[place] TEAM 1 - Stepdown/ICU TEAM  Eric [name]  GBT:[id number] DOB: 1926-12-27 DOA: 04/12/2018 PCP: [name], MD    Brief Narrative:  84 y.o.malewith a hx of CAD, Aortic sclerosis, ischemic cardiomyopathy, COPDon 3L at home, OSA, tachy-brady syndromes/p PPM,HTN, CML, and prostate CA who presentedwith2-3 days of dyspnea, tachycardia, and chest pain.   In the ED he was found to be in ST vs atrial ectopic tachycardia, w/ Twave inversions in V1 and aVr. Troponin elevation to 0.41.Chest x-ray showed chronic emphysematous changes.After metoprolol, HR slowed to ~110 and EKG showed afib.  Subjective: Appears comfortable in bed.  Reports intermittent shortness of breath.  Appears somewhat anxious.  Denies chest pain nausea vomiting or abdominal pain.  Remains intent on being discharged to independent living.  I have discussed disposition planning with the patient and we have agreed that he will be discharged to his independent living facility on 04/19/2018.  Assessment & Plan:  Newly diagnosed Atrial fibrillation with RVR  CHA2DS2-VASc is 4 - now on Amiodarone and Eliquis - stabilizing clinically - cont to ambulate - cont current medical tx   Elevated troponin - CAD s/p CABG C/w rate related demand ischemia - Cards has evaluated - asymptomatic at this time   Systolic CHF  EF 71-06% w/ diffuse hypokinesis - weight climbing w/ pt off diuretic - net negative ~329ml since admit - diuretic to resume today w/ improvement in renal fxn    Filed Weights   04/16/18 0400 04/17/18 0336 04/18/18 0500  Weight: 104.6 kg 101.5 kg 104.3 kg    CKD 3 baseline crt 1.0-1.3 - crt improving w/ holding of diuretic - resume lower does diuretic and follow clinically   Recent Labs  Lab 04/13/18 0245 04/14/18 0238 04/16/18 0209 04/17/18 0252 04/18/18 0227  CREATININE 1.58* 1.63* 2.17* 1.92* 1.51*    Anxiety does not want to take Xanax which he has tried in the past - appears  to be contributing to his discomfort at this time - has agreed to use oxycodone to assist w/ air hunger w/ encouragement from [facility]   COPD - Chronic respiratory failure with hypoxia   3 L O2 at baseline - appears his resp status is quite fragile day-to-day - presently stable on home O2 dose but seriously doubt he will be able to fend for himself in an ILF - nonetheless, this is his focus and desire and we will honor this - Hospice care to be arranged at home to hopefully aid in avoiding readmit   GERD Cont medical tx   Obstructive sleep apnea CPAP   Obesity - Body mass index is 37.11 kg/m.  CML (chronic myeloid leukemia)    Anemia of chronic disease  Hgb relatively stable - no evidence of blood loss    DM2 CBG controlled  Dyslipidemia cont Pravastatin  Goals of Care  Pt strongly desires to continue to live idependently - plan for d/c home ot his ILF apartment w/ Hospice assist 11/121  DVT prophylaxis: Eliqius  Code Status: DNR - NO CODE Family Communication: no family present at time of exam  Disposition Plan: desires return to [place] - plan for d/c 11/12  Consultants:  Cardiology Hospice  Palliative Care   Antimicrobials:  none  Objective: Blood pressure 140/61, pulse 70, temperature 98.4 F (36.9 C), temperature source Oral, resp. rate 15, height 5\' 6"  (1.676 m), weight 104.3 kg, SpO2 99 %.  Intake/Output Summary (Last 24 hours) at 04/18/2018 1357 Last data filed at 04/18/2018 1000  Gross per 24 hour  Intake 720 ml  Output 750 ml  Net -30 ml   Filed Weights   04/16/18 0400 04/17/18 0336 04/18/18 0500  Weight: 104.6 kg 101.5 kg 104.3 kg    Examination: General: anxious - but resp calm Lungs: course crackles th/o all fields as before  Cardiovascular: irreg irreg - rate controlled - no rub  Abdomen: NT/ND, soft, bowel sounds positive Extremities: 1+ B LE edema   CBC: Recent Labs  Lab 04/12/18 1135  04/16/18 0209 04/17/18 0252 04/18/18 0227  WBC 6.2   < >  11.3* 10.2 10.5  NEUTROABS 4.8  --   --   --   --   HGB 10.3*   < > 8.6* 8.2* 8.8*  HCT 34.7*   < > 28.6* 27.1* 27.9*  MCV 100.6*   < > 99.3 98.5 98.6  PLT 199   < > 152 159 161   < > = values in this interval not displayed.   Basic Metabolic Panel: Recent Labs  Lab 04/16/18 0209 04/17/18 0252 04/18/18 0227  NA 133* 131* 133*  K 4.0 4.0 4.3  CL 99 99 99  CO2 26 23 27   GLUCOSE 136* 157* 145*  BUN 31* 41* 35*  CREATININE 2.17* 1.92* 1.51*  CALCIUM 9.6 9.4 9.7  MG  --  2.2  --    GFR: Estimated Creatinine Clearance: 37.5 mL/min (A) (by C-G formula based on SCr of 1.51 mg/dL (H)).  Liver Function Tests: Recent Labs  Lab 04/12/18 1135 04/16/18 0209  AST 22 21  ALT 11 14  ALKPHOS 60 53  BILITOT 0.7 1.0  PROT 6.5 6.3*  ALBUMIN 3.7 3.6    Coagulation Profile: Recent Labs  Lab 04/14/18 0238 04/15/18 0225 04/16/18 0209 04/17/18 0252 04/18/18 0227  INR 1.10 1.14 1.29 1.36 1.31    Cardiac Enzymes: Recent Labs  Lab 04/12/18 1830 04/12/18 2335 04/13/18 0816  TROPONINI 0.59* 0.64* 0.39*    HbA1C: Hgb A1c MFr Bld  Date/Time Value Ref Range Status  01/11/2018 02:39 PM 6.6 (H) 4.6 - 6.5 % Final    Comment:    Glycemic Control Guidelines for People with Diabetes:Non Diabetic:  <6%Goal of Therapy: <7%Additional Action Suggested:  >8%   04/16/2017 02:31 PM 6.5 (H) 4.8 - 5.6 % Final    Comment:    (NOTE) Pre diabetes:          5.7%-6.4% Diabetes:              >6.4% Glycemic control for   <7.0% adults with diabetes     CBG: Recent Labs  Lab 04/17/18 1232 04/17/18 1654 04/17/18 2125 04/18/18 0816 04/18/18 1153  GLUCAP 139* 152* 119* 113* 123*    Recent Results (from the past 240 hour(s))  MRSA PCR Screening     Status: None   Collection Time: 04/14/18 11:48 AM  Result Value Ref Range Status   MRSA by PCR NEGATIVE NEGATIVE Final    Comment:        The GeneXpert MRSA Assay (FDA approved for NASAL specimens only), is one component of  a comprehensive MRSA colonization surveillance program. It is not intended to diagnose MRSA infection nor to guide or monitor treatment for MRSA infections. Performed at [facility], [place] [address], [place], [place] [address]      Scheduled Meds: . amiodarone  200 mg Oral BID  . apixaban  2.5 mg Oral BID  . aspirin EC  81 mg Oral Daily  .  budesonide  0.25 mg Nebulization BID  . diltiazem  180 mg Oral Daily  . famotidine  40 mg Oral QHS  . fluticasone  2 spray Each Nare Daily  . insulin aspart  0-5 Units Subcutaneous QHS  . insulin aspart  0-9 Units Subcutaneous TID WC  . ipratropium  0.5 mg Nebulization TID  . isosorbide mononitrate  15 mg Oral Daily  . levalbuterol  0.63 mg Nebulization TID  . pravastatin  60 mg Oral q1800  . senna  2 tablet Oral Daily     LOS: 6 days   Cherene Altes, MD [facility] Office  [phone] Pager - Text Page per Amion  If 7PM-7AM, please contact night-coverage per Amion 04/18/2018, 1:57 PM

## 2018-04-19 LAB — GLUCOSE, CAPILLARY
Glucose-Capillary: 123 mg/dL — ABNORMAL HIGH (ref 70–99)
Glucose-Capillary: 95 mg/dL (ref 70–99)
Glucose-Capillary: 142 mg/dL — ABNORMAL HIGH (ref 70–99)
Glucose-Capillary: 160 mg/dL — ABNORMAL HIGH (ref 70–99)

## 2018-04-19 LAB — CBC
HCT: 27.5 % — ABNORMAL LOW (ref 39.0–52.0)
HEMOGLOBIN: 8.3 g/dL — AB (ref 13.0–17.0)
MCH: 30.2 pg (ref 26.0–34.0)
MCHC: 30.2 g/dL (ref 30.0–36.0)
MCV: 100 fL (ref 80.0–100.0)
PLATELETS: 177 10*3/uL (ref 150–400)
RBC: 2.75 MIL/uL — ABNORMAL LOW (ref 4.22–5.81)
RDW: 13.5 % (ref 11.5–15.5)
WBC: 8.5 10*3/uL (ref 4.0–10.5)
nRBC: 0 % (ref 0.0–0.2)

## 2018-04-19 LAB — BASIC METABOLIC PANEL
Anion gap: 8 (ref 5–15)
BUN: 36 mg/dL — AB (ref 8–23)
CALCIUM: 9.5 mg/dL (ref 8.9–10.3)
CO2: 25 mmol/L (ref 22–32)
CREATININE: 1.55 mg/dL — AB (ref 0.61–1.24)
Chloride: 99 mmol/L (ref 98–111)
GFR calc Af Amer: 44 mL/min — ABNORMAL LOW (ref >60)
GFR, EST NON AFRICAN AMERICAN: 38 mL/min — AB (ref >60)
GLUCOSE: 163 mg/dL — AB (ref 70–99)
Potassium: 4.1 mmol/L (ref 3.5–5.1)
SODIUM: 132 mmol/L — AB (ref 135–145)

## 2018-04-19 NOTE — Progress Notes (Addendum)
Palliative Medicine RN Note: Call in to [name]. Her note indicates that patient will not be seen for hospice recertification until TOMORROW 11/13. Patient will likely fail quickly at home without hospice in place, possibly returning to the ED. I have requested clarification regarding if [facility] will admit if he d/c today or if admission is contingent on the F2F with their provider.  [name] Eric Hoque, RN, BSN, [facility] Palliative Medicine Team 04/19/2018 8:48 AM Office [phone]   ADDENDUM: Rec'd call back from [place]. Patient cannot be admitted to hospice until their provider sees him tomorrow afternoon. [facility] recommends we keep him here until tomorrow in order to avoid readmission.  [name] Eric Cullifer, RN, BSN, [facility] Palliative Medicine Team 04/19/2018 9:06 AM Office [phone]

## 2018-04-19 NOTE — Discharge Summary (Addendum)
PROGRESS NOTE   Eric [name]  MR#: [id number]  DOB:Oct 03, 1926  Date of Admission: 04/12/2018 Date of Discharge: 04/20/2018  Attending Physician:Jeffrey Hennie Duos, MD  Patient's ENI:[id number], Real Cons, MD  Consults: Cardiology Hospice  Palliative Care   Disposition: D/C to home w/ home hospice 11/13  Follow-up Appts: To be determined at actual time of d/c.   Discharge Diagnoses: Newly diagnosed Atrial fibrillation with RVR  Elevated troponin - CAD s/p CABG Systolic CHF  CKD 3 Anxiety COPD - Chronic respiratory failure with hypoxia  GERD Obstructive sleep apnea Obesity - Body mass index is 37.11 kg/m. CML (chronic myeloid leukemia)  Anemia of chronic disease  DM2 Dyslipidemia  Initial presentation: 84 y.o.malewith a hx of CAD, Aortic sclerosis, ischemic cardiomyopathy, COPDon 3L at home, OSA, tachy-brady syndromes/p PPM,HTN, CML, and prostate CA who presentedwith2-3 days of dyspnea, tachycardia, and chest pain.   In the ED he was found to be in ST vs atrial ectopic tachycardia, w/ Twave inversions in V1 and aVr. Troponin elevation to 0.41.Chest x-ray showed chronic emphysematous changes.After metoprolol, HR slowed to ~110 and EKG showed afib.  Hospital Course:  Newly diagnosed Atrial fibrillation with RVR  CHA2DS2-VASc is 4 - now on Amiodarone and Eliquis - HR has stabilized  - paying for Eliquis may prove to be too expensive to allow ongoing tx   Elevated troponin - CAD s/p CABG C/w rate related demand ischemia - Cards evaluated reccommended aspirin, - asymptomatic at time of d/c    Systolic CHF  EF 36-14% w/ diffuse hypokinesis - diuretic dosing ongoing, hold ARB, spironolactone due to elevated creatinine   CKD 3 baseline crt 1.0-1.3 - crt improved w/ transient holding of diuretic - resumed lower does diuretic 11/12 which pt/renal fxn has thus far tolerated well   Anxiety appeared to be contributing to his discomfort - responded well  to low dose ativan during this admit - has agreed to use oxycodone to assist w/ air hunger w/ encouragement from [facility]   COPD - Chronic respiratory failure with hypoxia  3 L O2atbaseline - appears his resp status is quite fragile day-to-day - presently stable on home O2 dose   GERD Cont medical tx   Obstructive sleep apnea CPAP QHS as tolerated   Obesity - Body mass index is 37.11 kg/m.  CML (chronic myeloid leukemia)   Anemia of chronic disease  Hgb relatively stable th/o this admit w/ no evidence of blood loss    DM2 CBG controlled during admission  Dyslipidemia cont Pravastatin  Goals of Care. Patient is DNR. Recommended to cont f/u with palliative care, hospice   Day of Discharge BP (!) 183/66 (BP Location: Left Arm)   Pulse 92   Temp 97.6 F (36.4 C) (Oral)   Resp (!) 24   Ht 5\' 6"  (1.676 m)   Wt 100 kg   SpO2 98%   BMI 35.58 kg/m   Allergies as of 04/20/2018      Reactions   Actos [pioglitazone Hydrochloride] Other (See Comments)   "felt funny, drowsy, and weak":   Buprenorphine Hcl Nausea And Vomiting   Celebrex [celecoxib] Other (See Comments)   "felt funny"   Demerol Palpitations, Other (See Comments)   Increased BP   Meperidine Palpitations   Other reaction(s): Other (See Comments) Increased BP   Morphine And Related Nausea And Vomiting   Ciprofloxacin Other (See Comments)   arthralgia   Metformin Nausea And Vomiting   Zocor [simvastatin] Other (See Comments)   Makes pt very  drowsy      Medication List    STOP taking these medications   glipiZIDE 5 MG tablet Commonly known as:  GLUCOTROL   NON FORMULARY   spironolactone 25 MG tablet Commonly known as:  ALDACTONE   valsartan 160 MG tablet Commonly known as:  DIOVAN     TAKE these medications   acetaminophen 500 MG tablet Commonly known as:  TYLENOL Take 1,000 mg by mouth every 6 (six) hours as needed for mild pain or fever.   ALPRAZolam 0.25 MG tablet Commonly known as:   XANAX Take 1 tablet (0.25 mg total) by mouth daily as needed for up to 5 days for anxiety.   amiodarone 200 MG tablet Commonly known as:  PACERONE Take 1 tablet (200 mg total) by mouth 2 (two) times daily.   apixaban 2.5 MG Tabs tablet Commonly known as:  ELIQUIS Take 1 tablet (2.5 mg total) by mouth 2 (two) times daily.   aspirin 81 MG EC tablet Take 1 tablet (81 mg total) by mouth daily. Start taking on:  04/21/2018   budesonide 0.25 MG/2ML nebulizer solution Commonly known as:  PULMICORT Take 2 mLs (0.25 mg total) by nebulization 2 (two) times daily.   diltiazem 180 MG 24 hr capsule Commonly known as:  CARDIZEM CD Take 1 capsule (180 mg total) by mouth daily. Start taking on:  04/21/2018   fluticasone 50 MCG/ACT nasal spray Commonly known as:  FLONASE Place 2 sprays into both nostrils daily. Allergies   furosemide 40 MG tablet Commonly known as:  LASIX Take 1 tablet (40 mg total) by mouth 2 (two) times daily.   glucose blood test strip USE TO CHECK BLOOD SUGARS ONCE A DAY   guaiFENesin-dextromethorphan 100-10 MG/5ML syrup Commonly known as:  ROBITUSSIN DM Take 5 mLs by mouth every 4 (four) hours as needed for cough.   HYDROcodone-acetaminophen 7.5-325 MG tablet Commonly known as:  NORCO Take 1 tablet by mouth every 6 (six) hours as needed for up to 5 days for moderate pain or severe pain.   insulin aspart 100 UNIT/ML injection Commonly known as:  novoLOG Inject 0-5 Units into the skin at bedtime. Insulin sliding scale   ipratropium-albuterol 0.5-2.5 (3) MG/3ML Soln Commonly known as:  DUONEB Take 3 mLs by nebulization 3 (three) times daily.   isosorbide mononitrate 30 MG 24 hr tablet Commonly known as:  IMDUR Take 0.5 tablets (15 mg total) by mouth daily. Start taking on:  04/21/2018   nitroGLYCERIN 0.4 MG SL tablet Commonly known as:  NITROSTAT Place 0.4 mg under the tongue every 5 (five) minutes as needed for chest pain. Reported on 12/12/2015     pravastatin 20 MG tablet Commonly known as:  PRAVACHOL Take 60 mg by mouth at bedtime.   ranitidine 300 MG tablet Commonly known as:  ZANTAC Take 600 mg by mouth at bedtime.   senna 8.6 MG tablet Commonly known as:  SENOKOT Take 2 tablets by mouth daily.   SYSTANE 0.4-0.3 % Soln Generic drug:  Polyethyl Glycol-Propyl Glycol Apply 2 drops to eye 3 (three) times daily as needed (dry eyes).       Pt is resting comfortably in a bedside chair. Denies cp, n/v, or abdom pain.   Physical Exam: General: No acute respiratory distress - sitting up in bedside chair  Lungs: fine crackles th/o - no wheezing  Cardiovascular: Reg rate - no M or rub  Abdomen: Nontender, nondistended, marked ventral hernia w/ ostomy in place, soft  Extremities: 1+  B LE edema   Basic Metabolic Panel: Recent Labs  Lab 04/14/18 0238 04/16/18 0209 04/17/18 0252 04/18/18 0227 04/19/18 0234  NA 137 133* 131* 133* 132*  K 3.9 4.0 4.0 4.3 4.1  CL 100 99 99 99 99  CO2 26 26 23 27 25   GLUCOSE 136* 136* 157* 145* 163*  BUN 26* 31* 41* 35* 36*  CREATININE 1.63* 2.17* 1.92* 1.51* 1.55*  CALCIUM 9.3 9.6 9.4 9.7 9.5  MG  --   --  2.2  --   --     Liver Function Tests: Recent Labs  Lab 04/16/18 0209  AST 21  ALT 14  ALKPHOS 53  BILITOT 1.0  PROT 6.3*  ALBUMIN 3.6    Coags: Recent Labs  Lab 04/14/18 0238 04/15/18 0225 04/16/18 0209 04/17/18 0252 04/18/18 0227  INR 1.10 1.14 1.29 1.36 1.31    CBC: Recent Labs  Lab 04/15/18 0225 04/16/18 0209 04/17/18 0252 04/18/18 0227 04/19/18 0234  WBC 10.9* 11.3* 10.2 10.5 8.5  HGB 9.2* 8.6* 8.2* 8.8* 8.3*  HCT 29.9* 28.6* 27.1* 27.9* 27.5*  MCV 100.0 99.3 98.5 98.6 100.0  PLT 153 152 159 161 177    Cardiac Enzymes: Recent Labs  Lab 04/12/18 1830 04/12/18 2335 04/13/18 0816  TROPONINI 0.59* 0.64* 0.39*   BNP (last 3 results) Recent Labs    04/12/18 1135  BNP 483.0*    ProBNP (last 3 results) Recent Labs    10/19/17 1448  PROBNP  473     CBG: Recent Labs  Lab 04/18/18 1707 04/18/18 2141 04/19/18 0806 04/19/18 1226 04/19/18 1713  GLUCAP 132* 143* 123* 95 142*    Recent Results (from the past 240 hour(s))  MRSA PCR Screening     Status: None   Collection Time: 04/14/18 11:48 AM  Result Value Ref Range Status   MRSA by PCR NEGATIVE NEGATIVE Final    Comment:        The GeneXpert MRSA Assay (FDA approved for NASAL specimens only), is one component of a comprehensive MRSA colonization surveillance program. It is not intended to diagnose MRSA infection nor to guide or monitor treatment for MRSA infections. Performed at [facility], [place] [address] [place], [place] [address]      04/19/2018, 5:50 PM   Cherene Altes, MD [facility] Office  [phone] Pager [phone]  On-Call/Text Page:      [email or web address]      password [facility]

## 2018-04-19 NOTE — Progress Notes (Addendum)
[facility])  Received call from PMT inquiring availability for patient to be seen once discharged.  Pt will need to have face to face visit with [facility] physician for re-certification.  The soonest the physician can evaluate Mr. Plemmons is 11/13 at 1430.    Venia Carbon BSN, RN [facility] Liaison (listed in [place]

## 2018-04-19 NOTE — Care Management Important Message (Signed)
Important Message  Patient Details  Name: HURSCHEL PAYNTER MRN: [id number] Date of Birth: [birth year]   Medicare Important Message Given:  Yes    Nataki Mccrumb P Jervis Trapani 04/19/2018, 2:11 PM

## 2018-04-19 NOTE — Plan of Care (Signed)
Patient is wanting to go home. Still has some mild distress when coughing and has coarse crackles. Stable O2 sat at 97% on 3 L of o2. Used CPAP at night and did well. Has productive cough. Plan is to be d/c to independent living center today.

## 2018-04-19 NOTE — Progress Notes (Signed)
[facility]) hospital liaison note.  Patient is scheduled to have a face to face assessment with [facility] physician on 04/20/2018 at 1430.  If patient is discharged prior to this visit and has immediate needs or concerns he is to call [facility] 24 hr triage nurse line @ [phone]. He will receive an immediate needs visit to address his concerns until his physician visit is completed.   Elenor Quinones, CSW is aware of this plan.   [facility] will continue to follow while hospitalized and be available as needed during this referral.  Thank you,  Farrel Gordon, RN, [facility] Liaison (listed on [place]) [phone]

## 2018-04-20 DIAGNOSIS — I5022 Chronic systolic (congestive) heart failure: Secondary | ICD-10-CM | POA: Diagnosis not present

## 2018-04-20 DIAGNOSIS — E785 Hyperlipidemia, unspecified: Secondary | ICD-10-CM | POA: Diagnosis not present

## 2018-04-20 DIAGNOSIS — C92Z0 Other myeloid leukemia not having achieved remission: Secondary | ICD-10-CM | POA: Diagnosis not present

## 2018-04-20 DIAGNOSIS — M6281 Muscle weakness (generalized): Secondary | ICD-10-CM | POA: Diagnosis not present

## 2018-04-20 DIAGNOSIS — E1122 Type 2 diabetes mellitus with diabetic chronic kidney disease: Secondary | ICD-10-CM | POA: Diagnosis not present

## 2018-04-20 DIAGNOSIS — C9210 Chronic myeloid leukemia, BCR/ABL-positive, not having achieved remission: Secondary | ICD-10-CM | POA: Diagnosis not present

## 2018-04-20 DIAGNOSIS — I4891 Unspecified atrial fibrillation: Secondary | ICD-10-CM | POA: Diagnosis not present

## 2018-04-20 DIAGNOSIS — K219 Gastro-esophageal reflux disease without esophagitis: Secondary | ICD-10-CM | POA: Diagnosis not present

## 2018-04-20 DIAGNOSIS — N179 Acute kidney failure, unspecified: Secondary | ICD-10-CM | POA: Diagnosis not present

## 2018-04-20 DIAGNOSIS — I509 Heart failure, unspecified: Secondary | ICD-10-CM | POA: Diagnosis not present

## 2018-04-20 DIAGNOSIS — D649 Anemia, unspecified: Secondary | ICD-10-CM | POA: Diagnosis not present

## 2018-04-20 DIAGNOSIS — N183 Chronic kidney disease, stage 3 (moderate): Secondary | ICD-10-CM | POA: Diagnosis not present

## 2018-04-20 DIAGNOSIS — E669 Obesity, unspecified: Secondary | ICD-10-CM | POA: Diagnosis not present

## 2018-04-20 DIAGNOSIS — C9290 Myeloid leukemia, unspecified, not having achieved remission: Secondary | ICD-10-CM | POA: Diagnosis not present

## 2018-04-20 DIAGNOSIS — D638 Anemia in other chronic diseases classified elsewhere: Secondary | ICD-10-CM | POA: Diagnosis not present

## 2018-04-20 DIAGNOSIS — I2510 Atherosclerotic heart disease of native coronary artery without angina pectoris: Secondary | ICD-10-CM | POA: Diagnosis not present

## 2018-04-20 DIAGNOSIS — J449 Chronic obstructive pulmonary disease, unspecified: Secondary | ICD-10-CM | POA: Diagnosis not present

## 2018-04-20 DIAGNOSIS — G4733 Obstructive sleep apnea (adult) (pediatric): Secondary | ICD-10-CM | POA: Diagnosis not present

## 2018-04-20 DIAGNOSIS — I5023 Acute on chronic systolic (congestive) heart failure: Secondary | ICD-10-CM | POA: Diagnosis not present

## 2018-04-20 DIAGNOSIS — F411 Generalized anxiety disorder: Secondary | ICD-10-CM | POA: Diagnosis not present

## 2018-04-20 DIAGNOSIS — J9611 Chronic respiratory failure with hypoxia: Secondary | ICD-10-CM | POA: Diagnosis not present

## 2018-04-20 DIAGNOSIS — R2689 Other abnormalities of gait and mobility: Secondary | ICD-10-CM | POA: Diagnosis not present

## 2018-04-20 DIAGNOSIS — E1169 Type 2 diabetes mellitus with other specified complication: Secondary | ICD-10-CM | POA: Diagnosis not present

## 2018-04-20 LAB — GLUCOSE, CAPILLARY
Glucose-Capillary: 118 mg/dL — ABNORMAL HIGH (ref 70–99)
Glucose-Capillary: 136 mg/dL — ABNORMAL HIGH (ref 70–99)

## 2018-04-20 MED ORDER — DILTIAZEM HCL ER COATED BEADS 180 MG PO CP24: 180.0000 mg | ORAL_CAPSULE | Freq: Every day | ORAL | 0 refills | Status: AC

## 2018-04-20 MED ORDER — ASPIRIN 81 MG PO TBEC: 81.0000 mg | DELAYED_RELEASE_TABLET | Freq: Every day | ORAL | Status: AC

## 2018-04-20 MED ORDER — HYDROCODONE-ACETAMINOPHEN 7.5-325 MG PO TABS: 1.0000 | ORAL_TABLET | Freq: Four times a day (QID) | ORAL | 0 refills | Status: AC | PRN

## 2018-04-20 MED ORDER — AMIODARONE HCL 200 MG PO TABS: 200.0000 mg | ORAL_TABLET | Freq: Two times a day (BID) | ORAL | 0 refills | Status: AC

## 2018-04-20 MED ORDER — LEVALBUTEROL HCL 1.25 MG/0.5ML IN NEBU
1.2500 mg | INHALATION_SOLUTION | Freq: Three times a day (TID) | RESPIRATORY_TRACT | Status: DC
  Administered 2018-04-20: 1.25 mg via RESPIRATORY_TRACT
  Filled 2018-04-20: qty 0.5

## 2018-04-20 MED ORDER — ALPRAZOLAM 0.25 MG PO TABS: 0.2500 mg | ORAL_TABLET | Freq: Every day | ORAL | 1 refills | Status: AC | PRN

## 2018-04-20 MED ORDER — INSULIN ASPART 100 UNIT/ML ~~LOC~~ SOLN: 0.0000 [IU] | Freq: Every day | SUBCUTANEOUS | 11 refills | Status: AC

## 2018-04-20 MED ORDER — ISOSORBIDE MONONITRATE ER 30 MG PO TB24: 15.0000 mg | ORAL_TABLET | Freq: Every day | ORAL | 0 refills | Status: AC

## 2018-04-20 MED ORDER — APIXABAN 2.5 MG PO TABS: 2.5000 mg | ORAL_TABLET | Freq: Two times a day (BID) | ORAL | 0 refills | Status: DC

## 2018-04-20 MED ORDER — LEVALBUTEROL HCL 1.25 MG/0.5ML IN NEBU: 1.2500 mg | INHALATION_SOLUTION | Freq: Four times a day (QID) | RESPIRATORY_TRACT | Status: DC | PRN

## 2018-04-20 NOTE — Progress Notes (Signed)
[facility] PROGRESS NOTE  Eric [name] UYQ:[id number] DOB: 1927-04-05 DOA: 04/12/2018 PCP: [name], MD  Brief summary   84 y.o.malewith a hx of CAD, Aortic sclerosis, ischemic cardiomyopathy, COPDon 3L at home, OSA, tachy-brady syndromes/p PPM,HTN, CML, and prostate CA who presentedwith2-3 days of dyspnea, tachycardia, and chest pain.   In the ED he was found to be in ST vs atrial ectopic tachycardia, w/ Twave inversions in V1 and aVr. Troponin elevation to 0.41.Chest x-ray showed chronic emphysematous changes.After metoprolol, HR slowed to ~110 and EKG showed afib.  Assessment/Plan:  Newly diagnosed Atrial fibrillation with RVR on admission. CHA2DS2-VASc is 4, now Amiodarone and Eliquis - HR has stabilized, on Eliquis  Elevated troponin - CAD s/p CABG C/w rate related demand ischemia - Cardsevaluated- asymptomatic at time of d/c    Systolic CHF  EF 63-87% w/ diffuse hypokinesis - diuretic dosing ongoing, at half of his usual home dose due to climbing crt in hospital   CKD 3 baseline crt 1.0-1.3 - crt improvedw/ transient holding ofdiuretic - resumed lower does diuretic 11/12 which pt/renal fxn has thus far tolerated well   Anxiety appeared to be contributing to his discomfort - responded well to low dose ativan during this admit - has agreed to use oxycodone to assist w/ air hunger w/ encouragement from [facility]  COPD - Chronic respiratory failure with hypoxia  3 L O2atbaseline - appears his resp status is quite fragile day-to-day - presently stable on home O2 dose   GERD Cont medical tx   Obstructive sleep apnea CPAP QHS as tolerated   Obesity -Body mass index is 37.11 kg/m.  CML (chronic myeloid leukemia)   Anemia of chronic disease  Hgb relatively stable th/o this admit w/ no evidence of blood loss   DM2 CBG controlled during admission  Dyslipidemia cont Pravastatin  Goals of Care  Pt strongly desires to  continue to live idependently- plan for d/c home to his ILF apartment w/ Hospice assist if this is approved after eval on 11/13  Code Status: DNR Family Communication: d/w patient,  (indicate person spoken with, relationship, and if by phone, the number) Disposition Plan: Pt is agreeable for SNF. If not  Then home with hospice    Consultants:  Palliative   Cardiology   Procedures:  Eco   Antibiotics: Anti-infectives (From admission, onward)   None        (indicate start date, and stop date if known)  HPI/Subjective: Alert. Reports mild cough. No fevers. D/w patient, he is agreeable to SNF. Consulted SW. If not then plan for home with hospice   Objective: Vitals:   04/20/18 0724 04/20/18 0732  BP: (!) 116/49   Pulse: 77   Resp: 20   Temp: 98 F (36.7 C)   SpO2: 99% 99%    Intake/Output Summary (Last 24 hours) at 04/20/2018 0937 Last data filed at 04/20/2018 0815 Gross per 24 hour  Intake 240 ml  Output 650 ml  Net -410 ml   Filed Weights   04/18/18 0500 04/19/18 0141 04/20/18 0400  Weight: 104.3 kg 100 kg 103.8 kg    Exam:   General:  No distress   Cardiovascular: s1,s2 rrr  Respiratory: no wheezing   Abdomen: soft, nt   Musculoskeletal: no leg edema    Data Reviewed: Basic Metabolic Panel: Recent Labs  Lab 04/14/18 0238 04/16/18 0209 04/17/18 0252 04/18/18 0227 04/19/18 0234  NA 137 133* 131* 133* 132*  K 3.9 4.0 4.0 4.3 4.1  CL 100 99 99 99 99  CO2 26 26 23 27 25   GLUCOSE 136* 136* 157* 145* 163*  BUN 26* 31* 41* 35* 36*  CREATININE 1.63* 2.17* 1.92* 1.51* 1.55*  CALCIUM 9.3 9.6 9.4 9.7 9.5  MG  --   --  2.2  --   --    Liver Function Tests: Recent Labs  Lab 04/16/18 0209  AST 21  ALT 14  ALKPHOS 53  BILITOT 1.0  PROT 6.3*  ALBUMIN 3.6   No results for input(s): LIPASE, AMYLASE in the last 168 hours. No results for input(s): AMMONIA in the last 168 hours. CBC: Recent Labs  Lab 04/15/18 0225 04/16/18 0209  04/17/18 0252 04/18/18 0227 04/19/18 0234  WBC 10.9* 11.3* 10.2 10.5 8.5  HGB 9.2* 8.6* 8.2* 8.8* 8.3*  HCT 29.9* 28.6* 27.1* 27.9* 27.5*  MCV 100.0 99.3 98.5 98.6 100.0  PLT 153 152 159 161 177   Cardiac Enzymes: No results for input(s): CKTOTAL, CKMB, CKMBINDEX, TROPONINI in the last 168 hours. BNP (last 3 results) Recent Labs    04/12/18 1135  BNP 483.0*    ProBNP (last 3 results) Recent Labs    10/19/17 1448  PROBNP 473    CBG: Recent Labs  Lab 04/19/18 0806 04/19/18 1226 04/19/18 1713 04/19/18 2120 04/20/18 0756  GLUCAP 123* 95 142* 160* 118*    Recent Results (from the past 240 hour(s))  MRSA PCR Screening     Status: None   Collection Time: 04/14/18 11:48 AM  Result Value Ref Range Status   MRSA by PCR NEGATIVE NEGATIVE Final    Comment:        The GeneXpert MRSA Assay (FDA approved for NASAL specimens only), is one component of a comprehensive MRSA colonization surveillance program. It is not intended to diagnose MRSA infection nor to guide or monitor treatment for MRSA infections. Performed at [facility], [place] [address] [place], [place] [address]      Studies: No results found.  Scheduled Meds: . amiodarone  200 mg Oral BID  . apixaban  2.5 mg Oral BID  . aspirin EC  81 mg Oral Daily  . budesonide  0.25 mg Nebulization BID  . diltiazem  180 mg Oral Daily  . famotidine  40 mg Oral QHS  . fluticasone  2 spray Each Nare Daily  . furosemide  20 mg Oral BID  . insulin aspart  0-5 Units Subcutaneous QHS  . insulin aspart  0-9 Units Subcutaneous TID WC  . ipratropium  0.5 mg Nebulization TID  . isosorbide mononitrate  15 mg Oral Daily  . levalbuterol  1.25 mg Nebulization TID  . pravastatin  60 mg Oral q1800  . senna  2 tablet Oral Daily   Continuous Infusions:  Principal Problem:   Atrial fibrillation with RVR (HCC) Active Problems:   Type 2 diabetes mellitus, controlled (HCC)   Dyslipidemia   Hypertensive heart disease  with CHF (HCC)   Coronary atherosclerosis   COPD mixed type (HCC)   GERD   Obstructive sleep apnea   Chronic diastolic heart failure (HCC)   Obesity (BMI 30-39.9)   CML (chronic myeloid leukemia) ([place])   Essential hypertension   Anemia of chronic disease   Chronic respiratory failure with hypoxia (HCC)   Elevated troponin   Acute on chronic systolic heart failure (HCC)   New onset a-fib ([place])   Demand ischemia of myocardium (HCC)   Acute renal failure superimposed on stage 3 chronic kidney disease (  [place])   Shortness of breath   Advanced care planning/counseling discussion   Palliative care by specialist    Time spent: >35 minutes     Kinnie Feil  [facility] Pager [phone]. If 7PM-7AM, please contact night-coverage at [email or web address], password [facility] 04/20/2018, 9:37 AM  LOS: 8 days

## 2018-04-20 NOTE — Progress Notes (Signed)
OT Cancellation Note  Patient Details Name: PINK MAYE MRN: [id number] DOB: 05-Jun-1927   Cancelled Treatment:    Reason Eval/Treat Not Completed: Fatigue/lethargy limiting ability to participate(Pt sleeping soundly, did not disturb. Will follow.)  Malka So 04/20/2018, 2:41 PM  Nestor Lewandowsky, OTR/L Acute Rehabilitation Services Pager: [phone] Office: [phone]

## 2018-04-20 NOTE — Progress Notes (Signed)
Physical Therapy Treatment Patient Details Name: Eric [name] MRN: [id number] DOB: 04/09/1927 Today's Date: 04/20/2018    History of Present Illness Pt is an 84 y/o male admitted secondary to tachycardia and SOB. Found to have a fib with RVR. Also with elevated troponins and awaiting cardiac cath, however, RN cleared for participation. PMH includes COPD on home O2, CAD, HTN, DM, R THA, and s/p colostomy.     PT Comments    Patient now agreeable to SNF. Pt stating upon entrance, "death is knocking on my door and I wish it would just take me." Maintaining current level of mobility. Ambulating 80 feet with walker and min guard assist. Fatigues easily and needs cueing for self pacing. Continues with generalized weakness and decreased endurance. Will progress as tolerated.   Follow Up Recommendations  Supervision/Assistance - 24 hour;SNF     Equipment Recommendations  None recommended by PT    Recommendations for Other Services       Precautions / Restrictions Precautions Precautions: Fall Restrictions Weight Bearing Restrictions: No    Mobility  Bed Mobility Overal bed mobility: Needs Assistance Bed Mobility: Supine to Sit     Supine to sit: Min guard     General bed mobility comments: Min guard with increased time and effort  Transfers Overall transfer level: Needs assistance Equipment used: Rolling walker (2 wheeled) Transfers: Sit to/from [facility] Sit to Stand: Min assist         General transfer comment: Min A to power up into standing and then gain balance in standing  Ambulation/Gait Ambulation/Gait assistance: Min guard Gait Distance (Feet): 80 Feet Assistive device: Rolling walker (2 wheeled) Gait Pattern/deviations: Step-through pattern;Decreased stride length;Trunk flexed Gait velocity: Decreased  Gait velocity interpretation: <1.8 ft/sec, indicate of risk for recurrent falls General Gait Details: Slow cadence and fatigues  easily. Cues for self pacing. Trunk flexed posture throughout.   Stairs             Wheelchair Mobility    Modified Rankin (Stroke Patients Only)       Balance Overall balance assessment: Needs assistance Sitting-balance support: No upper extremity supported;Feet supported Sitting balance-Leahy Scale: Fair     Standing balance support: Bilateral upper extremity supported;During functional activity Standing balance-Leahy Scale: Poor Standing balance comment: Reliant on BUE support.                             Cognition Arousal/Alertness: Awake/alert Behavior During Therapy: Anxious Overall Cognitive Status: No family/caregiver present to determine baseline cognitive functioning                                 General Comments: Pt anxious about his functional decline. Requiring increased encouragement.      Exercises      General Comments General comments (skin integrity, edema, etc.): Session on 3L O2      Pertinent Vitals/Pain Pain Assessment: Faces Faces Pain Scale: Hurts a little bit Pain Location: general discomfort with coughing Pain Descriptors / Indicators: Discomfort;Grimacing Pain Intervention(s): Monitored during session    Home Living                      Prior Function            PT Goals (current goals can now be found in the care plan section) Acute Rehab PT Goals Patient Stated Goal:  to go home and have hospice come   PT Goal Formulation: With patient Time For Goal Achievement: 04/27/18 Potential to Achieve Goals: Fair Progress towards PT goals: Progressing toward goals    Frequency    Min 2X/week      PT Plan Frequency needs to be updated    Co-evaluation              AM-PAC PT "6 Clicks" Daily Activity  Outcome Measure  Difficulty turning over in bed (including adjusting bedclothes, sheets and blankets)?: A Little Difficulty moving from lying on back to sitting on the side of the  bed? : Unable Difficulty sitting down on and standing up from a chair with arms (e.g., wheelchair, bedside commode, etc,.)?: Unable Help needed moving to and from a bed to chair (including a wheelchair)?: A Little Help needed walking in hospital room?: A Little Help needed climbing 3-5 steps with a railing? : A Lot 6 Click Score: 13    End of Session Equipment Utilized During Treatment: Gait belt;Oxygen Activity Tolerance: Patient tolerated treatment well Patient left: in chair;with nursing/sitter in room;with call bell/phone within reach;with chair alarm set Nurse Communication: Mobility status;Other (comment) PT Visit Diagnosis: Unsteadiness on feet (R26.81);Muscle weakness (generalized) (M62.81)     Time: 1022-1050 PT Time Calculation (min) (ACUTE ONLY): 28 min  Charges:  $Gait Training: 8-22 mins $Therapeutic Activity: 8-22 mins                    Ellamae Sia, PT, DPT Acute Rehabilitation Services Pager [phone] Office [phone]    Willy Eddy 04/20/2018, 1:11 PM

## 2018-04-20 NOTE — Progress Notes (Addendum)
[facility] of [facility]) hospital liaison.   Confirmed appointment with [facility] physician: Dr. Karie Georges is scheduled to meet with patient for face to face visit and his assessment visit with [facility] RN at 14:30 today in patient's home. Notified Elenor Quinones, CMRN.  Patient eligibility has been confirmed.  Farrel Gordon, RN, CCM [facility] hospital liasion (listed on [place]) [phone]

## 2018-04-20 NOTE — Clinical Social Work Placement (Signed)
   CLINICAL SOCIAL WORK PLACEMENT  NOTE  Date:  04/20/2018  Patient Details  Name: Eric [name] MRN: [id number] Date of Birth: [birth year]  Clinical Social Work is seeking post-discharge placement for this patient at the [place] level of care (*CSW will initial, date and re-position this form in  chart as items are completed):  Yes   Patient/family provided with [place] Work Department's list of facilities offering this level of care within the geographic area requested by the patient (or if unable, by the patient's family).  Yes   Patient/family informed of their freedom to choose among providers that offer the needed level of care, that participate in Medicare, Medicaid or managed care program needed by the patient, have an available bed and are willing to accept the patient.  Yes   Patient/family informed of [facility]'s ownership interest in [facility] and [facility], as well as of the fact that they are under no obligation to receive care at these facilities.  PASRR submitted to EDS on       PASRR number received on 04/20/18     Existing PASRR number confirmed on       FL2 transmitted to all facilities in geographic area requested by pt/family on       FL2 transmitted to all facilities within larger geographic area on       Patient informed that his/her managed care company has contracts with or will negotiate with certain facilities, including the following:        Yes   Patient/family informed of bed offers received.  Patient [facility] bed at [facility]     Physician recommends and patient chooses bed at      Patient to be transferred to [facility] on 04/20/18.  Patient to be transferred to facility by [facility]     Patient family notified on   of transfer.  Name of family member notified:  patient alert and fully oriented and decline CSW contacting family     PHYSICIAN        Additional Comment:    _______________________________________________ Vinie Sill, [place] 04/20/2018, 1:51 PM

## 2018-04-20 NOTE — Plan of Care (Signed)

## 2018-04-20 NOTE — NC FL2 (Signed)
[place] LEVEL OF CARE SCREENING TOOL     IDENTIFICATION  Patient Name: Eric [name] Birthdate: 04/26/1927 Sex: male Admission Date (Current Location): 04/12/2018  [facility] and [place] Number:  Herbalist and Address:  [facility] [place] [place] [address], [place], [place] [address]      Provider Number: [id number]  Attending Physician Name and Address:  Kinnie Feil, MD  Relative Name and Phone Number:  Camry Theiss     Current Level of Care: Hospital Recommended Level of Care: [place] Prior Approval Number:    Date Approved/Denied:   PASRR Number: [id number]  Discharge Plan: SNF(palliattive to follow at SNF)    Current Diagnoses: Patient Active Problem List   Diagnosis Date Noted  . Shortness of breath   . Advanced care planning/counseling discussion   . Palliative care by specialist   . Acute on chronic systolic heart failure ([place])   . New onset a-fib ([place])   . Demand ischemia of myocardium ([place])   . Acute renal failure superimposed on stage 3 chronic kidney disease ([place])   . Elevated troponin 04/13/2018  . Atrial fibrillation with RVR ([place]) 04/12/2018  . Erosion of penile prosthesis ([place]) 04/22/2017  . Bilateral pleural effusion 10/12/2016  . Chronic respiratory failure with hypoxia ([place]) 10/12/2016  . Goals of care, counseling/discussion 08/04/2016  . Anxiety state 08-27-202018  . Anemia in neoplastic disease 12/12/2015  . Acquired pancytopenia ([place]) 10/28/2015  . Anemia of chronic disease 07/18/2015  . Tachy-brady syndrome ([place]) 07/10/2015  . Bilateral carotid artery stenosis 07/10/2015  . Essential hypertension 07/10/2015  . CML (chronic myeloid leukemia) ([place]) 06/26/2015  . Myeloproliferative neoplasm ([place]) 06/25/2015  . Back pain 12/10/2014  . Obesity (BMI 30-39.9) 12/08/2014  . Hartmann's pouch of intestine ([place]) 12/08/2014  . CAD (coronary artery disease) of artery bypass graft  11/28/2014  . Chronic diastolic heart failure ([place]) 11/24/2014  . GERD (gastroesophageal reflux disease) 03/27/2014  . Generalized weakness 02/08/2014  . Obstructive sleep apnea 04/11/2011  . Type 2 diabetes mellitus, controlled ([place]) 01/18/2009  . Dyslipidemia 01/18/2009  . Hypertensive heart disease with CHF ([place]) 01/18/2009  . Coronary atherosclerosis 01/18/2009  . GERD 01/18/2009  . COPD mixed type ([place]) 12/27/2006    Orientation RESPIRATION BLADDER Height & Weight     Self, Time, Situation, Place  O2(nasal cannula 3 (L/min)) Incontinent Weight: 228 lb 13.4 oz (103.8 kg) Height:  5\' 6"  (167.6 cm)  BEHAVIORAL SYMPTOMS/MOOD NEUROLOGICAL BOWEL NUTRITION STATUS      Colostomy Diet(please see discharge summary)  AMBULATORY STATUS COMMUNICATION OF NEEDS Skin   Limited Assist Verbally Normal                       Personal Care Assistance Level of Assistance  Feeding, Dressing, Bathing Bathing Assistance: Maximum assistance Feeding assistance: Limited assistance Dressing Assistance: Maximum assistance     Functional Limitations Info  Sight, Hearing, Speech Sight Info: Impaired Hearing Info: Impaired Speech Info: Adequate    SPECIAL CARE FACTORS FREQUENCY  PT (By licensed PT), OT (By licensed OT)     PT Frequency: 3x per week OT Frequency: 3x per week            Contractures Contractures Info: Not present    Additional Factors Info  Code Status, Allergies Code Status Info: DNR Allergies Info: Buprenorphine Hcl, Celebrex, Demerol, Meperidine, Actos Pioglitazone Hydrochloride, Morphine And Related, Ciprofloxacin, Metformin  Current Medications (04/20/2018):  This is the current hospital active medication list Current Facility-Administered Medications  Medication Dose Route Frequency Provider Last Rate Last Dose  . acetaminophen (TYLENOL) tablet 650 mg  650 mg Oral Q6H PRN Cherene Altes, MD   650 mg at 04/15/18 1335  . alum & mag  hydroxide-simeth (MAALOX/MYLANTA) 200-200-20 MG/5ML suspension 30 mL  30 mL Oral Q4H PRN Janora Norlander, MD   30 mL at 04/13/18 0036  . amiodarone (PACERONE) tablet 200 mg  200 mg Oral BID Chundi, Vahini, MD   200 mg at 04/20/18 0840  . apixaban (ELIQUIS) tablet 2.5 mg  2.5 mg Oral BID Debbe Odea, MD   2.5 mg at 04/20/18 0840  . aspirin EC tablet 81 mg  81 mg Oral Daily Chundi, Vahini, MD   81 mg at 04/20/18 0840  . budesonide (PULMICORT) nebulizer solution 0.25 mg  0.25 mg Nebulization BID Janora Norlander, MD   0.25 mg at 04/20/18 0737  . diltiazem (CARDIZEM CD) 24 hr capsule 180 mg  180 mg Oral Daily Cherene Altes, MD   180 mg at 04/20/18 0840  . famotidine (PEPCID) tablet 40 mg  40 mg Oral QHS Janora Norlander, MD   40 mg at 04/19/18 2114  . fluticasone (FLONASE) 50 MCG/ACT nasal spray 2 spray  2 spray Each Nare Daily Janora Norlander, MD   2 spray at 04/20/18 [phone]  . furosemide (LASIX) tablet 20 mg  20 mg Oral BID Cherene Altes, MD   20 mg at 04/20/18 0840  . guaiFENesin (MUCINEX) 12 hr tablet 600 mg  600 mg Oral BID PRN Cherene Altes, MD   600 mg at 04/17/18 2227  . guaiFENesin-dextromethorphan (ROBITUSSIN DM) 100-10 MG/5ML syrup 5 mL  5 mL Oral Q4H PRN Cherene Altes, MD   5 mL at 04/18/18 1721  . HYDROcodone-acetaminophen (NORCO) 7.5-325 MG per tablet 1 tablet  1 tablet Oral Q6H PRN Cherene Altes, MD   1 tablet at 04/17/18 2219  . insulin aspart (novoLOG) injection 0-5 Units  0-5 Units Subcutaneous QHS Rizwan, Saima, MD      . insulin aspart (novoLOG) injection 0-9 Units  0-9 Units Subcutaneous TID WC Debbe Odea, MD   1 Units at 04/19/18 1827  . ipratropium (ATROVENT) nebulizer solution 0.5 mg  0.5 mg Nebulization TID Debbe Odea, MD   0.5 mg at 04/20/18 0732  . isosorbide mononitrate (IMDUR) 24 hr tablet 15 mg  15 mg Oral Daily Chundi, Vahini, MD   15 mg at 04/20/18 0848  . levalbuterol (XOPENEX) nebulizer solution 1.25 mg  1.25 mg Nebulization TID Kinnie Feil, MD      . levalbuterol (XOPENEX) nebulizer solution 1.25 mg  1.25 mg Nebulization Q6H PRN Kinnie Feil, MD      . LORazepam (ATIVAN) tablet 0.5 mg  0.5 mg Sublingual Q4H PRN Earlie Counts, NP   0.5 mg at 04/17/18 2219  . nitroGLYCERIN (NITROSTAT) SL tablet 0.4 mg  0.4 mg Sublingual Q5 min PRN Janora Norlander, MD      . ondansetron [facility]) injection 4 mg  4 mg Intravenous Q6H PRN Janora Norlander, MD   4 mg at 04/19/18 2114  . oxyCODONE (ROXICODONE) 5 MG/5ML solution 5 mg  5 mg Oral Q2H PRN Earlie Counts, NP   5 mg at 04/19/18 2114  . polyvinyl alcohol (LIQUIFILM TEARS) 1.4 % ophthalmic solution 2 drop  2 drop Both Eyes TID PRN  Janora Norlander, MD      . pravastatin (PRAVACHOL) tablet 60 mg  60 mg Oral q1800 Janora Norlander, MD   60 mg at 04/19/18 1828  . senna (SENOKOT) tablet 17.2 mg  2 tablet Oral Daily Fredia Sorrow, MD   17.2 mg at 04/20/18 0840     Discharge Medications: Please see discharge summary for a list of discharge medications.  Relevant Imaging Results:  Relevant Lab Results:   Additional Information SS# [id number]   palliative to follow at SNF   [place]

## 2018-04-20 NOTE — Progress Notes (Signed)
Pt will DC [id number] DC date: 04/20/18 Family notified: patient was alert and oriented and refused CSW contacting his family. Transport [id number]   RN, patient, and facility notified of DC. Discharge Summary sent to facility. RN given number for report [phone], room 3206. DC packet on chart. Ambulance transport requested for patient.   CSW signing off.  Thurmond Butts, [place] Social Worker [phone]

## 2018-04-20 NOTE — Care Management Note (Signed)
Case Management Note  Patient Details  Name: Eric [name] MRN: [id number] Date of Birth: 26-Feb-1927  Subjective/Objective:                    Action/Plan:  PTA independent from home alone. Pt has colostomy and informed CM that he managed it independently at home.   Pt is on oxygen 2 liters continious supplied by [facility] - pt has portable oxygen tank for transport but unfortunately it is at home and pt doesn't have support system to retreive it at discharge - pt may need transport home at discharge - CSW consulted.  Pt informed CM that he is interested in discharging home with [facility] - CM contacted [facility] and made referral.  Attending gave CM verbal referral for home with hospice.  Pt informed CM that he is able to care for himself at home and has all the equipment that he needs in the home.   Expected Discharge Date:                  Expected Discharge Plan:  Home w Hospice Care  In-House Referral:  Clinical Social Work  Discharge planning Services  CM Consult  Post Acute Care Choice:    Choice offered to:     DME Arranged:    DME Agency:     HH Arranged:    [place] Agency:     Status of Service:  In process, will continue to follow  If discussed at Long Length of Stay Meetings, dates discussed:    Additional Comments: 04/20/2018  Pt now agreeable for SNF - safest discharge plan for pt.  Attending aware.  CSW contacted promptly and informed that pt now agreeable for SNF and that pt is ready for discharge today. SNF has been the recommendation since therapy assessed pt however pt previously refused.  [facility] made aware of change in discharge plan  04/18/18 Pt in agreement with [facility] to intiate services at discharge.  CM just informed that [facility] will have to have an agency attending to perform face to face with pt before the referral can be approved (earliest appt available 11/13 at 2:30pm).  CM informed attending  11/7/1  [facility] to follow up with CM to inform if pt is accepted into program  post assessment.  [facility] may not pay for Eliquis - resource to follow up with CM.   CM informed pt of copay as documented in Epic - pt can not afford copay.  CM to follow up with attending once determination is made by hospice with coverage.   [name], RN 04/20/2018, 10:02 AM

## 2018-04-20 NOTE — Progress Notes (Signed)
Pt discharge to SNF, [place] with [facility], refused RN contacting his family about his transfer. Discharge summary given to [facility]. Report given to [place], Therapist, sports. No further questions.

## 2018-04-20 NOTE — Telephone Encounter (Signed)
error 

## 2018-04-22 DIAGNOSIS — J449 Chronic obstructive pulmonary disease, unspecified: Secondary | ICD-10-CM | POA: Diagnosis not present

## 2018-04-22 DIAGNOSIS — K219 Gastro-esophageal reflux disease without esophagitis: Secondary | ICD-10-CM | POA: Diagnosis not present

## 2018-04-22 DIAGNOSIS — D649 Anemia, unspecified: Secondary | ICD-10-CM | POA: Diagnosis not present

## 2018-04-22 DIAGNOSIS — I509 Heart failure, unspecified: Secondary | ICD-10-CM | POA: Diagnosis not present

## 2018-04-22 DIAGNOSIS — G4733 Obstructive sleep apnea (adult) (pediatric): Secondary | ICD-10-CM | POA: Diagnosis not present

## 2018-04-22 DIAGNOSIS — I4891 Unspecified atrial fibrillation: Secondary | ICD-10-CM | POA: Diagnosis not present

## 2018-04-22 DIAGNOSIS — C9290 Myeloid leukemia, unspecified, not having achieved remission: Secondary | ICD-10-CM | POA: Diagnosis not present

## 2018-04-22 DIAGNOSIS — I2510 Atherosclerotic heart disease of native coronary artery without angina pectoris: Secondary | ICD-10-CM | POA: Diagnosis not present

## 2018-04-22 DIAGNOSIS — N183 Chronic kidney disease, stage 3 (moderate): Secondary | ICD-10-CM | POA: Diagnosis not present

## 2018-04-22 DIAGNOSIS — E1122 Type 2 diabetes mellitus with diabetic chronic kidney disease: Secondary | ICD-10-CM | POA: Diagnosis not present

## 2018-04-22 DIAGNOSIS — F411 Generalized anxiety disorder: Secondary | ICD-10-CM | POA: Diagnosis not present

## 2018-04-22 DIAGNOSIS — J9611 Chronic respiratory failure with hypoxia: Secondary | ICD-10-CM | POA: Diagnosis not present

## 2018-04-23 NOTE — Progress Notes (Deleted)
Cardiology Office Note:    Date:  04/23/2018   ID:  Eric [name], DOB 04/10/1927, MRN [id number]  PCP:  [name], MD  Cardiologist:  Sinclair Grooms, MD   Referring MD: [name], *   No chief complaint on file. ***  History of Present Illness:    Eric [name] is a 84 y.o. male with a hx of CAD, prior remote coronary bypass grafting in 1995, prior DES in 2008, 2009, and 2012, bilateral carotid disease, essential hypertension, hyperlipidemia, and diabetes mellitus.  Recent hospital admission for new onset atrial fibrillation with elevated troponin I, and systolic CHF.  Past Medical History:  Diagnosis Date  . ALLERGIC RHINITIS   . ANEMIA-NOS   . AORTIC SCLEROSIS   . Asthma   . CARDIOMYOPATHY, ISCHEMIC   . CAROTID BRUIT, RIGHT 02/27/2008  . Cataract    surgery  . CML (chronic myeloid leukemia) ([place]) 06/26/2015  . COPD   . CORONARY ARTERY DISEASE    a. s/p CABG in 1995 b. DES in 2008, 2009, and most recent in 2012 with DES to SVG-OM  . DIABETES MELLITUS-TYPE II    diet controlled  . Diastolic dysfunction, Grade 1 11/24/2014  . Diverticulitis of colon with perforation 11/22/2014  . Diverticulosis   . GERD   . HIATAL HERNIA   . Hx of echocardiogram    Echo (9/15):  Mild LVH, EF 50-55%, no RWMA, Gr 1 DD, MAC, mild LAE.  [name] HYPERLIPIDEMIA   . HYPERTENSION   . Hyponatremia 11/22/2014  . IBS (irritable bowel syndrome)   . LACTOSE INTOLERANCE   . OA (osteoarthritis)   . OBESITY   . On home oxygen therapy    "3L all the time" (10/12/2016)  . Partial small bowel obstruction ([place])   . PERIPHERAL VASCULAR DISEASE   . Primary hyperparathyroidism ([place])    Lab Results Component Value Date  PTH 150.7* 02/13/2013  CALCIUM 11.0* 02/13/2013  CAION 1.21 03/15/2008    . Prostate cancer ([place])    seed implants 2004  . SICK SINUS/ TACHY-BRADY SYNDROME 09/2007   s/p PPM st judes  . Sleep apnea   . SMALL BOWEL OBSTRUCTION 04/18/2009   Qualifier: History of  By:  Asa Lente MD, Jannifer Rodney Symptomatic diverticulosis 01/18/2009   Qualifier: Diagnosis of  By: Shane Crutch, Amy S     Past Surgical History:  Procedure Laterality Date  . BACK SURGERY    . Bilateral cataracts    . COLON RESECTION N/A 11/28/2014   Procedure: EXPLORATORY LAPAROTOMY, SIGMOID COLECTOMY WITH COLOSTOMY;  Surgeon: Jackolyn Confer, MD;  Location: [facility];  Service: General;  Laterality: N/A;  . COLON SURGERY    . COLONOSCOPY    . CORONARY ARTERY BYPASS GRAFT    . ESOPHAGOGASTRODUODENOSCOPY  multiple  . FLEXIBLE SIGMOIDOSCOPY N/A 09/22/2013   Procedure: FLEXIBLE SIGMOIDOSCOPY;  Surgeon: Gatha Mayer, MD;  Location: [facility];  Service: Endoscopy;  Laterality: N/A;  . INGUINAL HERNIA REPAIR Bilateral   . LUMBAR [place] SURGERY  12/2008  . PACEMAKER INSERTION     DDD/[employer]         Last interrogation 2/13  on chart     Pacemaker guideline order Dr Tamala Julian on chart  . Partial small bowel obstruction  2009  . PENILE PROSTHESIS PLACEMENT    . PTCA  2008, 2009, 2012   with DES  . REMOVAL OF PENILE PROSTHESIS N/A 04/22/2017   Procedure: EXPLANT OF MULTICOMPONENT  PENILE PROSTHESIS;  Surgeon: Irine Seal, MD;  Location: [facility];  Service: Urology;  Laterality: N/A;  . TOTAL HIP ARTHROPLASTY  08/21/2011   Procedure: TOTAL HIP ARTHROPLASTY;  Surgeon: Johnn Hai, MD;  Location: [facility];  Service: Orthopedics;  Laterality: Right;    Current Medications: No outpatient medications have been marked as taking for the 04/25/18 encounter (Appointment) with Belva Crome, MD.     Allergies:   Actos [pioglitazone hydrochloride]; Buprenorphine hcl; Celebrex [celecoxib]; Demerol; Meperidine; Morphine and related; Ciprofloxacin; Metformin; and Zocor [simvastatin]   Social History   Socioeconomic History  . Marital status: Widowed    Spouse name: Not on file  . Number of children: Not on file  . Years of education: Not on file  . Highest education level: Not on file    Occupational History  . Occupation: retired    Fish farm manager: RETIRED  Social Needs  . Financial resource strain: Somewhat hard  . Food insecurity:    Worry: Patient refused    Inability: Patient refused  . Transportation needs:    Medical: Patient refused    Non-medical: Patient refused  Tobacco Use  . Smoking status: Former Smoker    Packs/day: 1.00    Years: 25.00    Pack years: 25.00    Types: Cigarettes    Last attempt to quit: 06/08/1994    Years since quitting: 23.8  . Smokeless tobacco: Never Used  Substance and Sexual Activity  . Alcohol use: No    Alcohol/week: 0.0 standard drinks  . Drug use: No  . 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 Physical activity:    Days per week: Patient refused    Minutes per session: Patient refused  . Stress: Not on file  Relationships  . Social connections:    Talks on phone: Patient refused    Gets together: Patient refused    Attends religious service: Patient refused    Active member of club or organization: Patient refused    Attends meetings of clubs or organizations: Patient refused    Relationship status: Patient refused  Other Topics Concern  . Not on file  Social History Narrative  . Not on file     Family History: The patient's ***family history includes Cancer in his mother; Heart attack in his brother, unknown relative, and unknown relative; Hypertension in his mother. There is no history of Colon cancer or Stroke.  ROS:   Please see the history of present illness.    *** All other systems reviewed and are negative.  EKGs/Labs/Other Studies Reviewed:    The following studies were reviewed today:  2 D Echocardiogram 04/2018: Study Conclusions  - Left ventricle: The cavity size was normal. Wall thickness was   increased in a pattern of mild LVH. Systolic function was mildly   reduced. The estimated ejection fraction was in the range  of 45%   to 50%. Diffuse hypokinesis. - Aortic valve: Valve mobility was restricted. - Aortic root: The aortic root was mildly dilated. - Mitral valve: Calcified annulus.  Impressions:  - Technically difficult; LV function mildly reduced (EF 45); mild   LVH; calcified aortic valve not well interrogated but no AS by   doppler; mildly dilated aortic root.  EKG:  EKG is *** ordered today.  The ekg ordered today demonstrates ***  Recent Labs: 10/19/2017: NT-Pro BNP 473 04/12/2018: B Natriuretic Peptide 483.0 04/16/2018: ALT 14  04/17/2018: Magnesium 2.2 04/19/2018: BUN 36; Creatinine, Ser 1.55; Hemoglobin 8.3; Platelets 177; Potassium 4.1; Sodium 132  Recent Lipid Panel    Component Value Date/Time   CHOL 123 04/12/2018 1830   TRIG 158 (H) 04/12/2018 1830   TRIG 70 01/06/2010   HDL 25 (L) 04/12/2018 1830   CHOLHDL 4.9 04/12/2018 1830   VLDL 32 04/12/2018 1830   LDLCALC 66 04/12/2018 1830    Physical Exam:    VS:  There were no vitals taken for this visit.    Wt Readings from Last 3 Encounters:  04/20/18 228 lb 13.4 oz (103.8 kg)  01/11/18 231 lb (104.8 kg)  01/05/18 233 lb 6.4 oz (105.9 kg)     GEN: *** Well nourished, well developed in no acute distress HEENT: Normal NECK: No JVD. LYMPHATICS: No lymphadenopathy CARDIAC: ***RRR, ***murmur, ***gallop, *** edema. VASCULAR: *** pulses. *** bruits. RESPIRATORY:  Clear to auscultation without rales, wheezing or rhonchi  ABDOMEN: Soft, non-tender, non-distended, No pulsatile mass, MUSCULOSKELETAL: No deformity  SKIN: Warm and dry NEUROLOGIC:  Alert and oriented x 3 PSYCHIATRIC:  Normal affect   ASSESSMENT:    1. Coronary artery disease of bypass graft of native heart with stable angina pectoris ([place])   2. Tachy-brady syndrome ([place])   3. Chronic diastolic heart failure ([place])   4. Hypertensive heart disease with acute on chronic systolic congestive heart failure ([place])   5. Dyslipidemia   6. Essential hypertension   7.  Atrial fibrillation with RVR ([place])   8. Controlled type 2 diabetes mellitus with other circulatory complication, without long-term current use of insulin (H[facility])    PLAN:    In order of problems listed above:  1. ***   Medication Adjustments/Labs and Tests Ordered: Current medicines are reviewed at length with the patient today.  Concerns regarding medicines are outlined above.  No orders of the defined types were placed in this encounter.  No orders of the defined types were placed in this encounter.   There are no Patient Instructions on file for this visit.   Signed, Sinclair Grooms, MD  04/23/2018 5:08 PM    [facility]

## 2018-04-25 ENCOUNTER — Ambulatory Visit: Payer: Medicare Other | Admitting: Interventional Cardiology

## 2018-04-26 ENCOUNTER — Encounter: Payer: Self-pay | Admitting: Internal Medicine

## 2018-04-27 ENCOUNTER — Non-Acute Institutional Stay: Payer: Medicare Other | Admitting: Licensed Clinical Social Worker

## 2018-04-27 DIAGNOSIS — J9611 Chronic respiratory failure with hypoxia: Secondary | ICD-10-CM | POA: Diagnosis not present

## 2018-04-27 DIAGNOSIS — I4891 Unspecified atrial fibrillation: Secondary | ICD-10-CM | POA: Diagnosis not present

## 2018-04-27 DIAGNOSIS — J449 Chronic obstructive pulmonary disease, unspecified: Secondary | ICD-10-CM | POA: Diagnosis not present

## 2018-04-27 DIAGNOSIS — I5022 Chronic systolic (congestive) heart failure: Secondary | ICD-10-CM | POA: Diagnosis not present

## 2018-04-27 DIAGNOSIS — Z515 Encounter for palliative care: Secondary | ICD-10-CM

## 2018-04-29 NOTE — Progress Notes (Signed)
COMMUNITY PALLIATIVE CARE SW NOTE  PATIENT NAME: Eric [name] DOB: 09-Jun-1926 MRN: [id number]  PRIMARY CARE PROVIDER: [name], MD  RESPONSIBLE PARTY:  Acct ID - Guarantor Home Phone Work Phone Relationship Acct Type  [id number] - [name],[phone]  Self P/F     [address], [place], [place] [address]     PLAN OF CARE and INTERVENTIONS:             1. GOALS OF CARE/ ADVANCE CARE PLANNING:  Patient's goal is to coordinate with facility staff his plans after rehab. 2. SOCIAL/EMOTIONAL/SPIRITUAL ASSESSMENT/ INTERVENTIONS:  SW met with patient in his room at [facility] SNF.  Patient is currently receiving PT/OT/ST since his admission on 04/20/18.  He said he was told he was going to die while he was in the hospital and gave up his apartment.  He was concerned about where he will live after his therapies are complete.  He is a widow and of Eric [name].  He was in the [place] War and not in combat.  He receives his medications and hearing aides through the [place].  Patient has three daughters and states they do not communicate with him.  Patient MOST form states DNR, limited interventions and use of ABT.  SW consulted the facility SW, Eric [name], prior to the visit with patient.  She stated patient presents with conflicting information.  SW met with Eric [name] after the visit with patient since Eric [name] was out of the office and updated her regarding the visit.  Patient could possibly discharge to a [place] SNF. 3. PATIENT/CAREGIVER EDUCATION/ COPING:  Patient copes by expressing his feelings openly. 4. PERSONAL EMERGENCY PLAN:  Per facility protocol. 5. COMMUNITY RESOURCES COORDINATION/ HEALTH CARE NAVIGATION:  None. 6. FINANCIAL/LEGAL CONCERNS/INTERVENTIONS:  Patient is on a fixed income.     SOCIAL HX:  Social History   Tobacco Use  . Smoking status: Former Smoker    Packs/day: 1.00    Years: 25.00    Pack years: 25.00    Types: Cigarettes    Last attempt to quit: 06/08/1994     Years since quitting: 23.9  . Smokeless tobacco: Never Used  Substance Use Topics  . Alcohol use: No    Alcohol/week: 0.0 standard drinks    CODE STATUS:  DNR  ADVANCED DIRECTIVES: N MOST FORM COMPLETE:  Y HOSPICE EDUCATION PROVIDED:  Y PPS:  Patient's appetite is normal.  He uses a w/c. Duration of visit and documentation:  60 minutes.      Eric Corn Lunette Tapp, LCSW

## 2018-05-03 DIAGNOSIS — C92Z0 Other myeloid leukemia not having achieved remission: Secondary | ICD-10-CM | POA: Diagnosis not present

## 2018-05-03 DIAGNOSIS — I5022 Chronic systolic (congestive) heart failure: Secondary | ICD-10-CM | POA: Diagnosis not present

## 2018-05-03 DIAGNOSIS — J9611 Chronic respiratory failure with hypoxia: Secondary | ICD-10-CM | POA: Diagnosis not present

## 2018-05-03 DIAGNOSIS — J449 Chronic obstructive pulmonary disease, unspecified: Secondary | ICD-10-CM | POA: Diagnosis not present

## 2018-05-04 ENCOUNTER — Other Ambulatory Visit: Payer: Self-pay | Admitting: *Deleted

## 2018-05-04 NOTE — Patient Outreach (Signed)
[facility]) Care Management  05/04/2018  CONLEE SLITER 1926-09-16 [id number]   [facility]) Care Management Post-Acute Care Coordination  05/04/2018  RIAN BUSCHE 11-Dec-1926 [id number]  Spoke with [facility] UM nurse. Reviewed patient case. She confirms that patient will transition to a [place] resident of facility at discharge from skilled nursing.   No [facility] care management needs identified at this time, RNCM will sign off case.  Royetta Crochet. [name], RN, BSN, [place] Post-Acute Care Coordinator [phone]

## 2018-05-08 ENCOUNTER — Emergency Department (HOSPITAL_COMMUNITY): Payer: Medicare Other

## 2018-05-08 ENCOUNTER — Encounter (HOSPITAL_COMMUNITY): Payer: Self-pay | Admitting: Emergency Medicine

## 2018-05-08 ENCOUNTER — Other Ambulatory Visit: Payer: Self-pay

## 2018-05-08 ENCOUNTER — Inpatient Hospital Stay (HOSPITAL_COMMUNITY)
Admission: EM | Admit: 2018-05-08 | Discharge: 2018-05-19 | DRG: 444 | Disposition: A | Discharge status: Skilled Nursing Facility | Payer: Medicare Other | Attending: Internal Medicine | Admitting: Internal Medicine

## 2018-05-08 DIAGNOSIS — J441 Chronic obstructive pulmonary disease with (acute) exacerbation: Secondary | ICD-10-CM | POA: Diagnosis present

## 2018-05-08 DIAGNOSIS — E876 Hypokalemia: Secondary | ICD-10-CM | POA: Diagnosis present

## 2018-05-08 DIAGNOSIS — K828 Other specified diseases of gallbladder: Secondary | ICD-10-CM | POA: Diagnosis not present

## 2018-05-08 DIAGNOSIS — J181 Lobar pneumonia, unspecified organism: Secondary | ICD-10-CM | POA: Diagnosis not present

## 2018-05-08 DIAGNOSIS — E785 Hyperlipidemia, unspecified: Secondary | ICD-10-CM | POA: Diagnosis present

## 2018-05-08 DIAGNOSIS — J9601 Acute respiratory failure with hypoxia: Secondary | ICD-10-CM | POA: Diagnosis not present

## 2018-05-08 DIAGNOSIS — D696 Thrombocytopenia, unspecified: Secondary | ICD-10-CM | POA: Diagnosis present

## 2018-05-08 DIAGNOSIS — F4321 Adjustment disorder with depressed mood: Secondary | ICD-10-CM | POA: Diagnosis present

## 2018-05-08 DIAGNOSIS — C9210 Chronic myeloid leukemia, BCR/ABL-positive, not having achieved remission: Secondary | ICD-10-CM | POA: Diagnosis present

## 2018-05-08 DIAGNOSIS — R58 Hemorrhage, not elsewhere classified: Secondary | ICD-10-CM | POA: Diagnosis not present

## 2018-05-08 DIAGNOSIS — J449 Chronic obstructive pulmonary disease, unspecified: Secondary | ICD-10-CM | POA: Diagnosis present

## 2018-05-08 DIAGNOSIS — I255 Ischemic cardiomyopathy: Secondary | ICD-10-CM | POA: Diagnosis present

## 2018-05-08 DIAGNOSIS — R0602 Shortness of breath: Secondary | ICD-10-CM | POA: Diagnosis not present

## 2018-05-08 DIAGNOSIS — Z87891 Personal history of nicotine dependence: Secondary | ICD-10-CM

## 2018-05-08 DIAGNOSIS — Z8546 Personal history of malignant neoplasm of prostate: Secondary | ICD-10-CM

## 2018-05-08 DIAGNOSIS — I5043 Acute on chronic combined systolic (congestive) and diastolic (congestive) heart failure: Secondary | ICD-10-CM | POA: Diagnosis present

## 2018-05-08 DIAGNOSIS — Z66 Do not resuscitate: Secondary | ICD-10-CM | POA: Diagnosis present

## 2018-05-08 DIAGNOSIS — Z7951 Long term (current) use of inhaled steroids: Secondary | ICD-10-CM

## 2018-05-08 DIAGNOSIS — R2689 Other abnormalities of gait and mobility: Secondary | ICD-10-CM | POA: Diagnosis not present

## 2018-05-08 DIAGNOSIS — J440 Chronic obstructive pulmonary disease with acute lower respiratory infection: Secondary | ICD-10-CM | POA: Diagnosis present

## 2018-05-08 DIAGNOSIS — I110 Hypertensive heart disease with heart failure: Secondary | ICD-10-CM | POA: Diagnosis present

## 2018-05-08 DIAGNOSIS — D539 Nutritional anemia, unspecified: Secondary | ICD-10-CM | POA: Diagnosis present

## 2018-05-08 DIAGNOSIS — K7689 Other specified diseases of liver: Secondary | ICD-10-CM | POA: Diagnosis not present

## 2018-05-08 DIAGNOSIS — J9621 Acute and chronic respiratory failure with hypoxia: Secondary | ICD-10-CM | POA: Diagnosis present

## 2018-05-08 DIAGNOSIS — I214 Non-ST elevation (NSTEMI) myocardial infarction: Secondary | ICD-10-CM | POA: Diagnosis not present

## 2018-05-08 DIAGNOSIS — Z885 Allergy status to narcotic agent status: Secondary | ICD-10-CM

## 2018-05-08 DIAGNOSIS — E1122 Type 2 diabetes mellitus with diabetic chronic kidney disease: Secondary | ICD-10-CM | POA: Diagnosis present

## 2018-05-08 DIAGNOSIS — I5032 Chronic diastolic (congestive) heart failure: Secondary | ICD-10-CM | POA: Diagnosis present

## 2018-05-08 DIAGNOSIS — I4891 Unspecified atrial fibrillation: Secondary | ICD-10-CM | POA: Diagnosis not present

## 2018-05-08 DIAGNOSIS — E440 Moderate protein-calorie malnutrition: Secondary | ICD-10-CM | POA: Diagnosis present

## 2018-05-08 DIAGNOSIS — Z7189 Other specified counseling: Secondary | ICD-10-CM

## 2018-05-08 DIAGNOSIS — I130 Hypertensive heart and chronic kidney disease with heart failure and stage 1 through stage 4 chronic kidney disease, or unspecified chronic kidney disease: Secondary | ICD-10-CM | POA: Diagnosis present

## 2018-05-08 DIAGNOSIS — Z6836 Body mass index (BMI) 36.0-36.9, adult: Secondary | ICD-10-CM

## 2018-05-08 DIAGNOSIS — R1084 Generalized abdominal pain: Secondary | ICD-10-CM | POA: Diagnosis not present

## 2018-05-08 DIAGNOSIS — M2550 Pain in unspecified joint: Secondary | ICD-10-CM | POA: Diagnosis not present

## 2018-05-08 DIAGNOSIS — Z951 Presence of aortocoronary bypass graft: Secondary | ICD-10-CM

## 2018-05-08 DIAGNOSIS — N183 Chronic kidney disease, stage 3 (moderate): Secondary | ICD-10-CM | POA: Diagnosis present

## 2018-05-08 DIAGNOSIS — B9620 Unspecified Escherichia coli [E. coli] as the cause of diseases classified elsewhere: Secondary | ICD-10-CM | POA: Diagnosis present

## 2018-05-08 DIAGNOSIS — J168 Pneumonia due to other specified infectious organisms: Secondary | ICD-10-CM | POA: Diagnosis not present

## 2018-05-08 DIAGNOSIS — G4733 Obstructive sleep apnea (adult) (pediatric): Secondary | ICD-10-CM | POA: Diagnosis present

## 2018-05-08 DIAGNOSIS — K810 Acute cholecystitis: Principal | ICD-10-CM | POA: Diagnosis present

## 2018-05-08 DIAGNOSIS — K589 Irritable bowel syndrome without diarrhea: Secondary | ICD-10-CM | POA: Diagnosis present

## 2018-05-08 DIAGNOSIS — D72829 Elevated white blood cell count, unspecified: Secondary | ICD-10-CM | POA: Diagnosis not present

## 2018-05-08 DIAGNOSIS — D509 Iron deficiency anemia, unspecified: Secondary | ICD-10-CM | POA: Diagnosis present

## 2018-05-08 DIAGNOSIS — E1151 Type 2 diabetes mellitus with diabetic peripheral angiopathy without gangrene: Secondary | ICD-10-CM | POA: Diagnosis present

## 2018-05-08 DIAGNOSIS — Z9981 Dependence on supplemental oxygen: Secondary | ICD-10-CM

## 2018-05-08 DIAGNOSIS — Z515 Encounter for palliative care: Secondary | ICD-10-CM | POA: Diagnosis not present

## 2018-05-08 DIAGNOSIS — F329 Major depressive disorder, single episode, unspecified: Secondary | ICD-10-CM | POA: Diagnosis not present

## 2018-05-08 DIAGNOSIS — R278 Other lack of coordination: Secondary | ICD-10-CM | POA: Diagnosis not present

## 2018-05-08 DIAGNOSIS — R7881 Bacteremia: Secondary | ICD-10-CM | POA: Diagnosis present

## 2018-05-08 DIAGNOSIS — D638 Anemia in other chronic diseases classified elsewhere: Secondary | ICD-10-CM | POA: Diagnosis present

## 2018-05-08 DIAGNOSIS — A4151 Sepsis due to Escherichia coli [E. coli]: Secondary | ICD-10-CM | POA: Diagnosis not present

## 2018-05-08 DIAGNOSIS — Y95 Nosocomial condition: Secondary | ICD-10-CM | POA: Diagnosis present

## 2018-05-08 DIAGNOSIS — J9610 Chronic respiratory failure, unspecified whether with hypoxia or hypercapnia: Secondary | ICD-10-CM | POA: Diagnosis not present

## 2018-05-08 DIAGNOSIS — J309 Allergic rhinitis, unspecified: Secondary | ICD-10-CM | POA: Diagnosis present

## 2018-05-08 DIAGNOSIS — Z881 Allergy status to other antibiotic agents status: Secondary | ICD-10-CM

## 2018-05-08 DIAGNOSIS — Z96641 Presence of right artificial hip joint: Secondary | ICD-10-CM | POA: Diagnosis present

## 2018-05-08 DIAGNOSIS — R0902 Hypoxemia: Secondary | ICD-10-CM | POA: Diagnosis not present

## 2018-05-08 DIAGNOSIS — R1011 Right upper quadrant pain: Secondary | ICD-10-CM | POA: Diagnosis not present

## 2018-05-08 DIAGNOSIS — R1312 Dysphagia, oropharyngeal phase: Secondary | ICD-10-CM | POA: Diagnosis not present

## 2018-05-08 DIAGNOSIS — Z9049 Acquired absence of other specified parts of digestive tract: Secondary | ICD-10-CM

## 2018-05-08 DIAGNOSIS — I495 Sick sinus syndrome: Secondary | ICD-10-CM | POA: Diagnosis present

## 2018-05-08 DIAGNOSIS — I2510 Atherosclerotic heart disease of native coronary artery without angina pectoris: Secondary | ICD-10-CM | POA: Diagnosis present

## 2018-05-08 DIAGNOSIS — K435 Parastomal hernia without obstruction or  gangrene: Secondary | ICD-10-CM | POA: Diagnosis present

## 2018-05-08 DIAGNOSIS — F419 Anxiety disorder, unspecified: Secondary | ICD-10-CM | POA: Diagnosis present

## 2018-05-08 DIAGNOSIS — E210 Primary hyperparathyroidism: Secondary | ICD-10-CM | POA: Diagnosis present

## 2018-05-08 DIAGNOSIS — Z8249 Family history of ischemic heart disease and other diseases of the circulatory system: Secondary | ICD-10-CM

## 2018-05-08 DIAGNOSIS — I700 Atherosclerosis of aorta: Secondary | ICD-10-CM | POA: Diagnosis present

## 2018-05-08 DIAGNOSIS — J189 Pneumonia, unspecified organism: Secondary | ICD-10-CM | POA: Diagnosis present

## 2018-05-08 DIAGNOSIS — E669 Obesity, unspecified: Secondary | ICD-10-CM | POA: Diagnosis present

## 2018-05-08 DIAGNOSIS — Z7982 Long term (current) use of aspirin: Secondary | ICD-10-CM

## 2018-05-08 DIAGNOSIS — I10 Essential (primary) hypertension: Secondary | ICD-10-CM | POA: Diagnosis present

## 2018-05-08 DIAGNOSIS — E119 Type 2 diabetes mellitus without complications: Secondary | ICD-10-CM

## 2018-05-08 DIAGNOSIS — Z950 Presence of cardiac pacemaker: Secondary | ICD-10-CM | POA: Diagnosis not present

## 2018-05-08 DIAGNOSIS — Z933 Colostomy status: Secondary | ICD-10-CM

## 2018-05-08 DIAGNOSIS — N179 Acute kidney failure, unspecified: Secondary | ICD-10-CM | POA: Diagnosis present

## 2018-05-08 DIAGNOSIS — J90 Pleural effusion, not elsewhere classified: Secondary | ICD-10-CM | POA: Diagnosis not present

## 2018-05-08 DIAGNOSIS — Z7401 Bed confinement status: Secondary | ICD-10-CM | POA: Diagnosis not present

## 2018-05-08 DIAGNOSIS — I482 Chronic atrial fibrillation, unspecified: Secondary | ICD-10-CM | POA: Diagnosis present

## 2018-05-08 DIAGNOSIS — E1169 Type 2 diabetes mellitus with other specified complication: Secondary | ICD-10-CM | POA: Diagnosis not present

## 2018-05-08 DIAGNOSIS — K219 Gastro-esophageal reflux disease without esophagitis: Secondary | ICD-10-CM | POA: Diagnosis present

## 2018-05-08 DIAGNOSIS — Z7901 Long term (current) use of anticoagulants: Secondary | ICD-10-CM

## 2018-05-08 DIAGNOSIS — Z955 Presence of coronary angioplasty implant and graft: Secondary | ICD-10-CM

## 2018-05-08 DIAGNOSIS — R52 Pain, unspecified: Secondary | ICD-10-CM | POA: Diagnosis not present

## 2018-05-08 DIAGNOSIS — E739 Lactose intolerance, unspecified: Secondary | ICD-10-CM | POA: Diagnosis present

## 2018-05-08 DIAGNOSIS — Z888 Allergy status to other drugs, medicaments and biological substances status: Secondary | ICD-10-CM

## 2018-05-08 DIAGNOSIS — R5381 Other malaise: Secondary | ICD-10-CM | POA: Diagnosis present

## 2018-05-08 DIAGNOSIS — Z79899 Other long term (current) drug therapy: Secondary | ICD-10-CM

## 2018-05-08 DIAGNOSIS — Z794 Long term (current) use of insulin: Secondary | ICD-10-CM

## 2018-05-08 DIAGNOSIS — R6520 Severe sepsis without septic shock: Secondary | ICD-10-CM | POA: Diagnosis not present

## 2018-05-08 DIAGNOSIS — J9690 Respiratory failure, unspecified, unspecified whether with hypoxia or hypercapnia: Secondary | ICD-10-CM | POA: Diagnosis not present

## 2018-05-08 DIAGNOSIS — I959 Hypotension, unspecified: Secondary | ICD-10-CM | POA: Diagnosis not present

## 2018-05-08 LAB — COMPREHENSIVE METABOLIC PANEL
ALBUMIN: 3.6 g/dL (ref 3.5–5.0)
ALK PHOS: 75 U/L (ref 38–126)
ALT: 14 U/L (ref 0–44)
AST: 16 U/L (ref 15–41)
Anion gap: 10 (ref 5–15)
BILIRUBIN TOTAL: 1.6 mg/dL — AB (ref 0.3–1.2)
BUN: 21 mg/dL (ref 8–23)
CALCIUM: 9.4 mg/dL (ref 8.9–10.3)
CO2: 28 mmol/L (ref 22–32)
CREATININE: 1.41 mg/dL — AB (ref 0.61–1.24)
Chloride: 96 mmol/L — ABNORMAL LOW (ref 98–111)
GFR calc Af Amer: 51 mL/min — ABNORMAL LOW (ref >60)
GFR calc non Af Amer: 44 mL/min — ABNORMAL LOW (ref >60)
GLUCOSE: 171 mg/dL — AB (ref 70–99)
Potassium: 3.3 mmol/L — ABNORMAL LOW (ref 3.5–5.1)
SODIUM: 134 mmol/L — AB (ref 135–145)
TOTAL PROTEIN: 7.5 g/dL (ref 6.5–8.1)

## 2018-05-08 LAB — URINALYSIS, ROUTINE W REFLEX MICROSCOPIC
Bacteria, UA: NONE SEEN
Bilirubin Urine: NEGATIVE
Glucose, UA: NEGATIVE mg/dL
Ketones, ur: 5 mg/dL — AB
LEUKOCYTES UA: NEGATIVE
Nitrite: NEGATIVE
Protein, ur: 30 mg/dL — AB
Specific Gravity, Urine: >1.046 — ABNORMAL HIGH (ref 1.005–1.030)
pH: 5 (ref 5.0–8.0)

## 2018-05-08 LAB — BLOOD GAS, ARTERIAL
Acid-Base Excess: 2.7 mmol/L — ABNORMAL HIGH (ref 0.0–2.0)
Bicarbonate: 26.9 mmol/L (ref 20.0–28.0)
Drawn by: 257701
O2 Content: 3 L/min
O2 SAT: 89.1 %
PO2 ART: 61.7 mmHg — AB (ref 83.0–108.0)
Patient temperature: 98.6
pCO2 arterial: 42.3 mmHg (ref 32.0–48.0)
pH, Arterial: 7.42 (ref 7.350–7.450)

## 2018-05-08 LAB — MAGNESIUM: Magnesium: 2 mg/dL (ref 1.7–2.4)

## 2018-05-08 LAB — CBC
HCT: 33.4 % — ABNORMAL LOW (ref 39.0–52.0)
Hemoglobin: 10.3 g/dL — ABNORMAL LOW (ref 13.0–17.0)
MCH: 30.2 pg (ref 26.0–34.0)
MCHC: 30.8 g/dL (ref 30.0–36.0)
MCV: 97.9 fL (ref 80.0–100.0)
PLATELETS: 161 10*3/uL (ref 150–400)
RBC: 3.41 MIL/uL — AB (ref 4.22–5.81)
RDW: 13.8 % (ref 11.5–15.5)
WBC: 11.7 10*3/uL — ABNORMAL HIGH (ref 4.0–10.5)
nRBC: 0 % (ref 0.0–0.2)

## 2018-05-08 LAB — STREP PNEUMONIAE URINARY ANTIGEN: STREP PNEUMO URINARY ANTIGEN: NEGATIVE

## 2018-05-08 LAB — LIPASE, BLOOD: Lipase: 20 U/L (ref 11–51)

## 2018-05-08 LAB — PHOSPHORUS: Phosphorus: 2.4 mg/dL — ABNORMAL LOW (ref 2.5–4.6)

## 2018-05-08 MED ORDER — PANTOPRAZOLE SODIUM 40 MG IV SOLR
40.0000 mg | INTRAVENOUS | Status: DC
  Administered 2018-05-08 – 2018-05-11 (×4): 40 mg via INTRAVENOUS
  Filled 2018-05-08 (×4): qty 40

## 2018-05-08 MED ORDER — IPRATROPIUM-ALBUTEROL 0.5-2.5 (3) MG/3ML IN SOLN
3.0000 mL | RESPIRATORY_TRACT | Status: DC | PRN
  Administered 2018-05-09 (×2): 3 mL via RESPIRATORY_TRACT
  Filled 2018-05-08 (×2): qty 3

## 2018-05-08 MED ORDER — SODIUM CHLORIDE 0.9 % IV SOLN
INTRAVENOUS | Status: DC | PRN
  Administered 2018-05-08: 250 mL via INTRAVENOUS
  Administered 2018-05-13: 1000 mL via INTRAVENOUS
  Administered 2018-05-15 – 2018-05-18 (×2): 250 mL via INTRAVENOUS

## 2018-05-08 MED ORDER — HYDROMORPHONE HCL 1 MG/ML IJ SOLN
0.5000 mg | INTRAMUSCULAR | Status: DC | PRN
  Administered 2018-05-08 – 2018-05-11 (×14): 0.5 mg via INTRAVENOUS
  Filled 2018-05-08 (×8): qty 0.5
  Filled 2018-05-08: qty 1
  Filled 2018-05-08 (×2): qty 0.5
  Filled 2018-05-08: qty 1
  Filled 2018-05-08 (×2): qty 0.5

## 2018-05-08 MED ORDER — VANCOMYCIN HCL IN DEXTROSE 1-5 GM/200ML-% IV SOLN
1000.0000 mg | Freq: Once | INTRAVENOUS | Status: AC
  Administered 2018-05-08: 1000 mg via INTRAVENOUS
  Filled 2018-05-08: qty 200

## 2018-05-08 MED ORDER — METRONIDAZOLE IN NACL 5-0.79 MG/ML-% IV SOLN
500.0000 mg | Freq: Three times a day (TID) | INTRAVENOUS | Status: DC
  Administered 2018-05-08 – 2018-05-19 (×33): 500 mg via INTRAVENOUS
  Filled 2018-05-08 (×33): qty 100

## 2018-05-08 MED ORDER — IOPAMIDOL (ISOVUE-300) INJECTION 61%
INTRAVENOUS | Status: AC
  Administered 2018-05-08: 100 mL via INTRAVENOUS
  Filled 2018-05-08: qty 100

## 2018-05-08 MED ORDER — ACETAMINOPHEN 325 MG PO TABS: 650.0000 mg | ORAL_TABLET | Freq: Four times a day (QID) | ORAL | Status: DC | PRN

## 2018-05-08 MED ORDER — BUDESONIDE 0.25 MG/2ML IN SUSP
RESPIRATORY_TRACT | Status: AC
  Filled 2018-05-08: qty 2

## 2018-05-08 MED ORDER — ONDANSETRON HCL 4 MG PO TABS: 4.0000 mg | ORAL_TABLET | Freq: Four times a day (QID) | ORAL | Status: DC | PRN

## 2018-05-08 MED ORDER — POTASSIUM CHLORIDE IN NACL 20-0.9 MEQ/L-% IV SOLN
INTRAVENOUS | Status: DC
  Administered 2018-05-08: 08:00:00 via INTRAVENOUS
  Filled 2018-05-08 (×2): qty 1000

## 2018-05-08 MED ORDER — BUDESONIDE 0.25 MG/2ML IN SUSP
0.2500 mg | Freq: Two times a day (BID) | RESPIRATORY_TRACT | Status: DC
  Administered 2018-05-08 – 2018-05-19 (×22): 0.25 mg via RESPIRATORY_TRACT
  Filled 2018-05-08 (×20): qty 2

## 2018-05-08 MED ORDER — ACETAMINOPHEN 650 MG RE SUPP: 650.0000 mg | Freq: Four times a day (QID) | RECTAL | Status: DC | PRN

## 2018-05-08 MED ORDER — HYDROMORPHONE HCL 1 MG/ML IJ SOLN
0.5000 mg | Freq: Once | INTRAMUSCULAR | Status: AC
  Administered 2018-05-08: 0.5 mg via INTRAVENOUS

## 2018-05-08 MED ORDER — IOPAMIDOL (ISOVUE-300) INJECTION 61%
100.0000 mL | Freq: Once | INTRAVENOUS | Status: AC | PRN
  Administered 2018-05-08: 100 mL via INTRAVENOUS

## 2018-05-08 MED ORDER — HEPARIN SODIUM (PORCINE) 5000 UNIT/ML IJ SOLN
5000.0000 [IU] | Freq: Three times a day (TID) | INTRAMUSCULAR | Status: DC
  Administered 2018-05-08 – 2018-05-09 (×3): 5000 [IU] via SUBCUTANEOUS
  Filled 2018-05-08 (×6): qty 1

## 2018-05-08 MED ORDER — POTASSIUM CHLORIDE IN NACL 20-0.9 MEQ/L-% IV SOLN
INTRAVENOUS | Status: DC
  Filled 2018-05-08: qty 1000

## 2018-05-08 MED ORDER — ONDANSETRON HCL 4 MG/2ML IJ SOLN
4.0000 mg | Freq: Four times a day (QID) | INTRAMUSCULAR | Status: DC | PRN
  Administered 2018-05-08 – 2018-05-11 (×3): 4 mg via INTRAVENOUS
  Filled 2018-05-08 (×3): qty 2

## 2018-05-08 MED ORDER — SODIUM CHLORIDE 0.9 % IV SOLN
1.0000 g | Freq: Three times a day (TID) | INTRAVENOUS | Status: DC
  Administered 2018-05-08 (×3): 1 g via INTRAVENOUS
  Filled 2018-05-08 (×4): qty 1

## 2018-05-08 MED ORDER — VANCOMYCIN HCL 10 G IV SOLR
1500.0000 mg | INTRAVENOUS | Status: DC
  Administered 2018-05-10: 1500 mg via INTRAVENOUS
  Filled 2018-05-08: qty 1500

## 2018-05-08 MED ORDER — VANCOMYCIN HCL IN DEXTROSE 1-5 GM/200ML-% IV SOLN: 1000.0000 mg | Freq: Once | INTRAVENOUS | Status: DC

## 2018-05-08 MED ORDER — FUROSEMIDE 10 MG/ML IJ SOLN
40.0000 mg | Freq: Two times a day (BID) | INTRAMUSCULAR | Status: DC
  Administered 2018-05-08 – 2018-05-09 (×2): 40 mg via INTRAVENOUS
  Filled 2018-05-08 (×2): qty 4

## 2018-05-08 MED ORDER — HYDROMORPHONE HCL 1 MG/ML IJ SOLN
0.5000 mg | Freq: Once | INTRAMUSCULAR | Status: AC
  Administered 2018-05-08: 0.5 mg via INTRAVENOUS
  Filled 2018-05-08: qty 1

## 2018-05-08 MED ORDER — PIPERACILLIN-TAZOBACTAM 3.375 G IVPB 30 MIN: 3.3750 g | Freq: Once | INTRAVENOUS | Status: DC

## 2018-05-08 MED ORDER — POTASSIUM CHLORIDE 10 MEQ/100ML IV SOLN
10.0000 meq | Freq: Once | INTRAVENOUS | Status: AC
  Administered 2018-05-08: 10 meq via INTRAVENOUS
  Filled 2018-05-08: qty 100

## 2018-05-08 MED ORDER — SODIUM CHLORIDE (PF) 0.9 % IJ SOLN
INTRAMUSCULAR | Status: AC
  Filled 2018-05-08: qty 50

## 2018-05-08 MED ORDER — HYDROMORPHONE HCL 1 MG/ML IJ SOLN
INTRAMUSCULAR | Status: AC
  Administered 2018-05-08: 0.5 mg via INTRAVENOUS
  Filled 2018-05-08: qty 1

## 2018-05-08 NOTE — H&P (Addendum)
History and Physical  Eric [name]:[id number] DOB: 14-Feb-1927 DOA: 05/08/2018  Referring physician: Charlann Lange, PA-C PCP: [name], MD  Outpatient Specialists:  Patient coming from: snf & is able to ambulate   Chief Complaint: Right upper quadrant abdominal pain  HPI: Eric [name] is a 84 y.o. male with medical history significant for coronary artery disease and aortic sclerosis hypertension hyperlipidemia, diabetes, COPD, O2 dependent on 3 L at home, chronic respiratory failure on oxygen, ischemic cardiomyopathy, atrial fibrillation on Eliquis, who presented to the emergency department with from the nursing home with severe right upper quadrant sharp abdominal pain with nausea vomiting that started at 9 PM last night.  Patient also has some shortness of breath with cough.  Cough makes the abdominal pain worse no fever or diarrhea she was just admitted April 12, 2018 for shortness of breath dyspnea on exertion and non-STEMI was diagnosed   ED Course: Was started on vancomycin and Zosyn Dr. Leonides Schanz consulted with surgeon Dr. Leighton Ruff advised to keep n.p.o. and admit to medicine.  Per her consult she is not very concerned about Cholecystitis especially since the gallbladder which is more sensitive does not show any cholecystitis is on any stones  Review of Systems:  . Pt complains of right upper quadrant abdominal pain radiating to the back, back pain and shortness of breath,  Pt denies any fever diarrhea dysuria.  Review of systems are otherwise negative   Past Medical History:  Diagnosis Date  . ALLERGIC RHINITIS   . ANEMIA-NOS   . AORTIC SCLEROSIS   . Asthma   . CARDIOMYOPATHY, ISCHEMIC   . CAROTID BRUIT, RIGHT 02/27/2008  . Cataract    surgery  . CML (chronic myeloid leukemia) (Eric [name]) 06/26/2015  . COPD   . CORONARY ARTERY DISEASE    a. s/p CABG in 1995 b. DES in 2008, 2009, and most recent in 2012 with DES to SVG-OM  . DIABETES MELLITUS-TYPE II    diet controlled  . Diastolic dysfunction, Grade 1 11/24/2014  . Diverticulitis of colon with perforation 11/22/2014  . Diverticulosis   . GERD   . HIATAL HERNIA   . Hx of echocardiogram    Echo (9/15):  Mild LVH, EF 50-55%, no RWMA, Gr 1 DD, MAC, mild LAE.  [name] HYPERLIPIDEMIA   . HYPERTENSION   . Hyponatremia 11/22/2014  . IBS (irritable bowel syndrome)   . LACTOSE INTOLERANCE   . OA (osteoarthritis)   . OBESITY   . On home oxygen therapy    "3L all the time" (10/12/2016)  . Partial small bowel obstruction ([place])   . PERIPHERAL VASCULAR DISEASE   . Primary hyperparathyroidism ([place])    Lab Results Component Value Date  PTH 150.7* 02/13/2013  CALCIUM 11.0* 02/13/2013  CAION 1.21 03/15/2008    . Prostate cancer ([place])    seed implants 2004  . SICK SINUS/ TACHY-BRADY SYNDROME 09/2007   s/p PPM st jud[employer]  . Sleep apnea   . SMALL BOWEL OBSTRUCTION 04/18/2009   Qualifier: History of  By: Asa Lente MD, Jannifer Rodney Symptomatic diverticulosis 01/18/2009   Qualifier: Diagnosis of  By: Shane Crutch, Amy S    Past Surgical History:  Procedure Laterality Date  . BACK SURGERY    . Bilateral cataracts    . COLON RESECTION N/A 11/28/2014   Procedure: EXPLORATORY LAPAROTOMY, SIGMOID COLECTOMY WITH COLOSTOMY;  Surgeon: Jackolyn Confer, MD;  Location: [facility];  Service: General;  Laterality: N/A;  . COLON SURGERY    .  COLONOSCOPY    . CORONARY ARTERY BYPASS GRAFT    . ESOPHAGOGASTRODUODENOSCOPY  multiple  . FLEXIBLE SIGMOIDOSCOPY N/A 09/22/2013   Procedure: FLEXIBLE SIGMOIDOSCOPY;  Surgeon: Gatha Mayer, MD;  Location: [facility];  Service: Endoscopy;  Laterality: N/A;  . INGUINAL HERNIA REPAIR Bilateral   . LUMBAR [place] SURGERY  12/2008  . PACEMAKER INSERTION     DDD/[employer]         Last interrogation 2/13  on chart     Pacemaker guideline order Dr Tamala Julian on chart  . Partial small bowel obstruction  2009  . PENILE PROSTHESIS PLACEMENT    . PTCA  2008, 2009, 2012   with DES  . REMOVAL OF  PENILE PROSTHESIS N/A 04/22/2017   Procedure: EXPLANT OF MULTICOMPONENT PENILE PROSTHESIS;  Surgeon: Irine Seal, MD;  Location: [facility];  Service: Urology;  Laterality: N/A;  . TOTAL HIP ARTHROPLASTY  08/21/2011   Procedure: TOTAL HIP ARTHROPLASTY;  Surgeon: Johnn Hai, MD;  Location: [facility];  Service: Orthopedics;  Laterality: Right;    Social History:  reports that he quit smoking about 23 years ago. His smoking use included cigarettes. He has a 25.00 pack-year smoking history. He has never used smokeless tobacco. He reports that he does not drink alcohol or use drugs.   Allergies  Allergen Reactions  . Actos [Pioglitazone Hydrochloride] Other (See Comments)    "felt funny, drowsy, and weak":  [name] Buprenorphine Hcl Nausea And Vomiting  . Celebrex [Celecoxib] Other (See Comments)    "felt funny"  . Demerol Palpitations and Other (See Comments)    Increased BP  . Meperidine Palpitations    Other reaction(s): Other (See Comments) Increased BP  . Morphine And Related Nausea And Vomiting  . Ciprofloxacin Other (See Comments)    arthralgia  . Metformin Nausea And Vomiting  . Zocor [Simvastatin] Other (See Comments)    Makes pt very drowsy    Family History  Problem Relation Age of Onset  . Hypertension Mother   . Cancer Mother   . Heart attack Unknown   . Heart attack Unknown   . Heart attack Brother   . Colon cancer Neg Hx   . Stroke Neg Hx       Prior to Admission medications   Medication Sig Start Date End Date Taking? Authorizing Provider  amiodarone (PACERONE) 200 MG tablet Take 1 tablet (200 mg total) by mouth 2 (two) times daily. 04/20/18  Yes Buriev, Arie Sabina, MD  apixaban (ELIQUIS) 5 MG TABS tablet Take 2.5 mg by mouth 2 (two) times daily.   Yes [provider]  aspirin EC 81 MG EC tablet Take 1 tablet (81 mg total) by mouth daily. 04/21/18  Yes Buriev, Arie Sabina, MD  budesonide (PULMICORT) 0.25 MG/2ML nebulizer solution Take 2 mLs (0.25 mg total) by  nebulization 2 (two) times daily. 12/06/17  Yes Lauraine Rinne, NP  diltiazem (CARDIZEM CD) 180 MG 24 hr capsule Take 1 capsule (180 mg total) by mouth daily. 04/21/18  Yes Buriev, Arie Sabina, MD  fluticasone (FLONASE) 50 MCG/ACT nasal spray Place 2 sprays into both nostrils daily. Allergies 08/01/14  Yes [name], MD  furosemide (LASIX) 40 MG tablet Take 1 tablet (40 mg total) by mouth 2 (two) times daily. 03/31/18  Yes Belva Crome, MD  HYDROcodone-acetaminophen (NORCO) 7.5-325 MG tablet Take 1 tablet by mouth every 6 (six) hours as needed for moderate pain or severe pain.   Yes [provider]  insulin aspart (NOVOLOG) 100 UNIT/ML injection Inject 0-5 Units into the skin at bedtime. Insulin sliding scale Patient taking differently: Inject 0-9 Units into the skin 3 (three) times daily with meals. Insulin sliding scale  101-150=1 units 151-200=2 units 201-250=3 units 251-300=5 units 301-350=7 units >350=9 units Call MD for BS>400 or <60 04/20/18  Yes Buriev, Arie Sabina, MD  ipratropium-albuterol (DUONEB) 0.5-2.5 (3) MG/3ML SOLN Take 3 mLs by nebulization 3 (three) times daily.  02/17/17  Yes [provider]  isosorbide mononitrate (IMDUR) 30 MG 24 hr tablet Take 0.5 tablets (15 mg total) by mouth daily. 04/21/18  Yes Buriev, Arie Sabina, MD  pravastatin (PRAVACHOL) 20 MG tablet Take 20 mg by mouth daily.    Yes [provider]  ranitidine (ZANTAC) 300 MG tablet Take 600 mg by mouth at bedtime.    Yes [provider]  senna (SENOKOT) 8.6 MG tablet Take 2 tablets by mouth daily.    Yes [provider]  acetaminophen (TYLENOL) 500 MG tablet Take 1,000 mg by mouth every 6 (six) hours as needed for mild pain or fever.    [provider]  apixaban (ELIQUIS) 2.5 MG TABS tablet Take 1 tablet (2.5 mg total) by mouth 2 (two) times daily. Patient not taking: Reported on 05/08/2018 04/20/18   Kinnie Feil, MD  glucose blood (ONE TOUCH ULTRA  TEST) test strip USE TO CHECK BLOOD SUGARS ONCE A DAY 10/28/17   [name], MD  guaiFENesin-dextromethorphan (ROBITUSSIN DM) 100-10 MG/5ML syrup Take 5 mLs by mouth every 4 (four) hours as needed for cough. 10/15/16   Lavina Hamman, MD    Physical Exam: BP (!) 163/53   Pulse 73   Temp 98.4 F (36.9 C) (Oral)   Resp 20   SpO2 92%   Exam:  . General: 84 y.o. year-old male well developed well nourished in mild respiratory distress.  Alert and oriented x 1 slightly confused.  He was not quite sure where he was and did not know a could not tell me where he lived . Cardiovascular: Regular rate and rhythm with no rubs or gallops.  No thyromegaly or JVD noted.   [name] Respiratory: Bilateral rhonchi wheezing throughout lung fields with gurgling.  Which slightly increased after he coughed and cleared his lungs increased inspiratory effort. . Abdomen: Soft exquisite tenderness in the upper quadrant nondistended with normal bowel sounds x4 quadrants.  Colostomy bag is on the left lower quadrant musculoskeletal: No lower extremity edema. 2/4 pulses in all 4 extremities. . Skin: No ulcerative lesions noted or rashes, . Psychiatry: Mood is appropriate for condition and setting           Labs on Admission: Personally reviewed by me Basic Metabolic Panel: Recent Labs  Lab 05/08/18 0328  NA 134*  K 3.3*  CL 96*  CO2 28  GLUCOSE 171*  BUN 21  CREATININE 1.41*  CALCIUM 9.4  MG 2.0  PHOS 2.4*   Liver Function Tests: Recent Labs  Lab 05/08/18 0328  AST 16  ALT 14  ALKPHOS 75  BILITOT 1.6*  PROT 7.5  ALBUMIN 3.6   Recent Labs  Lab 05/08/18 0328  LIPASE 20   No results for input(s): AMMONIA in the last 168 hours. CBC: Recent Labs  Lab 05/08/18 0328  WBC 11.7*  HGB 10.3*  HCT 33.4*  MCV 97.9  PLT 161   Cardiac Enzymes: No results for input(s): CKTOTAL, CKMB, CKMBINDEX, TROPONINI in the last 168 hours.  BNP (last  3 results) Recent Labs    04/12/18 1135  BNP  483.0*    ProBNP (last 3 results) Recent Labs    10/19/17 1448  PROBNP 473    CBG: No results for input(s): GLUCAP in the last 168 hours.  Radiological Exams on Admission: Ct Abdomen Pelvis W Contrast  Result Date: 05/08/2018 CLINICAL DATA:  Initial evaluation for acute right upper quadrant pain, cough and congestion. EXAM: CT ABDOMEN AND PELVIS WITH CONTRAST TECHNIQUE: Multidetector CT imaging of the abdomen and pelvis was performed using the standard protocol following bolus administration of intravenous contrast. CONTRAST:  176m ISOVUE-300 IOPAMIDOL (ISOVUE-300) INJECTION 61% COMPARISON:  Prior CT from 12/07/2014. FINDINGS: Lower chest: Dense consolidative opacities present within the bilateral lung bases, right slightly worse than left. Superimposed peripheral pleural based calcifications noted. Associated bilateral pleural effusions, complex on the left with loculated appearance. Ill-defined density within the left effusion noted (series 2, image 10). Mild cardiomegaly. Cardiac pacemaker electrodes partially visualized. Hepatobiliary: Multiple scattered cysts seen within the liver, largest of which position within the left hepatic lobe in measures 4.4 cm, similar to previous. Liver demonstrates no acute finding. Gallbladder is enlarged and hydropic in appearance with hazy pericholecystic fat stranding, suspicious for possible acute cholecystitis. No radiopaque calculi identified. No biliary dilatation. Pancreas: Diffuse fatty infiltration of the pancreas noted. Pancreas otherwise unremarkable without acute peripancreatic inflammation. Spleen: Spleen within normal limits. Adrenals/Urinary Tract: Adrenal glands are normal. Kidneys equal in size with symmetric enhancement. 3.4 cm simple left renal cyst noted. Additional scattered subcentimeter hypodensities too small the characterize, but statistically likely reflects small cysts as well. No nephrolithiasis or hydronephrosis. No focal enhancing  renal mass. No hydroureter. Partially distended bladder within normal limits. Stomach/Bowel: Stomach within normal limits. No evidence for bowel obstruction. Patient is status post sigmoid colectomy with left lower quadrant colostomy in place. Large parastomal hernia containing fat and loops of small bowel present within the left lower quadrant without associated inflammation or obstruction. Neck of the hernia measures approximately 4.1 cm in diameter. Additional ventral hernia seen just to the right of midline at the paraumbilical region contains fat and a portion of the transverse colon (series 2, image 48). No associated complication. Vascular/Lymphatic: Advanced aorto bi-iliac atherosclerotic disease. No aneurysm. Mesenteric vessels are patent proximally. No adenopathy. Reproductive: Brachytherapy seeds overlie the prostate. Other: No free air or fluid. Musculoskeletal: Right total hip arthroplasty in place. No acute osseus abnormality. No discrete lytic or blastic osseous lesions. IMPRESSION: 1. Enlarged and hydropic gallbladder with hazy pericholecystic fat stranding, suspicious for acute cholecystitis. Correlation with laboratory values recommended. Additionally, further evaluation with dedicated right upper quadrant ultrasound could be performed for further evaluation as clinically warranted. 2. Dense consolidative opacities within the bilateral lung bases, right greater than left, concerning for infiltrates/pneumonia. Associated bilateral pleural effusions, complex in appearance on the left with increased internal density, raising the possibility for empyema. Please note that a malignant process could also have this appearance. Correlation with fluid analysis may be helpful for further evaluation as clinically warranted. 3. Large left lower quadrant parastomal hernia containing loops of small bowel without associated inflammation or obstruction. 4. Advanced aorto bi-iliac atherosclerotic disease. No  aneurysm. Electronically Signed   By: BJeannine BogaM.D.   On: 05/08/2018 05:42   [place]Abdomen Limited  Result Date: 05/08/2018 CLINICAL DATA:  Initial evaluation for acute right upper quadrant pain. EXAM: ULTRASOUND ABDOMEN LIMITED RIGHT UPPER QUADRANT COMPARISON:  CT from same day. Please note that CT was performed following this  examination, although this exam only became available for review after the CT was completed. FINDINGS: Gallbladder: No stones or sludge within the gallbladder lumen. Gallbladder wall measure within normal limits at 2.3 mm. No free pericholecystic fluid. A positive sonographic Murphy sign was elicited on exam. Common bile duct: Diameter: 5 mm Liver: Multiple scattered hepatic cysts noted, largest of which is septated and measures 4.4 cm in the left hepatic lobe. These are grossly stable from prior CTs. Within normal limits in parenchymal echogenicity. Portal vein is patent on color Doppler imaging with normal direction of blood flow towards the liver. IMPRESSION: 1. Positive Murphy sign without additional sonographic features for acute cholecystitis. No cholelithiasis. 2. No biliary dilatation. 3. Multiple scattered hepatic cysts, better evaluated on previous CTs. Electronically Signed   By: Jeannine Boga M.D.   On: 05/08/2018 06:42    EKG: Independently reviewed.  Per ER MD no STEMI no A. fib  Assessment/Plan Present on Admission: . Acute cholecystitis . Obesity (BMI 30-39.9) . Hypertensive heart disease with CHF ([place]) . Essential hypertension . Atrial fibrillation with RVR ([place]) . Obstructive sleep apnea . Chronic diastolic heart failure ([place]) . (Resolved) Pneumonia . COPD mixed type ([place]) . Pneumonia  Principal Problem:   Acute cholecystitis Active Problems:   Type 2 diabetes mellitus, controlled ([place])   Hypertensive heart disease with CHF ([place])   COPD mixed type ([facility])   Obstructive sleep apnea   Chronic diastolic heart failure ([facility])   Obesity  (BMI 30-39.9)   Hartmann's pouch of intestine ([facility])   Essential hypertension   Atrial fibrillation with RVR ([facility])   Pneumonia  1.  Bilateral pneumonia patient has been started on vancomycin and Zosyn we will continue his oxygen which he is chronically at home  2.  COPD exacerbation we will start COPD protocol with nebulizer treatments antibiotics and oxygen  3.  Atrial fibrillation withCHA2DS2-VASc 4 which was diagnosed recently and was started on Eliquis.  Rate is controlled and his EKG does not show any atrial fibrillation at this time.  Eliquis is on hold per the recommendation of the surgeon he is being covered with heparin drip  4.  Right upper quadrant abdominal pain.  CT scan is suggesting cholelithiasis but upper quadrant ultrasound is not confirming that Dr. Marcello Moores has been consulted she is not quite concerned about cholecystitis she advised to admit to medicine and keep her n.p.o. and hold the Eliquis she will follow.  5.  Mild hypokalemia we will replace  6.  Diabetes mellitus patient is on metformin Metformin is on hold due to being n.p.o. we will cover him with insulin sliding scale.  7.  Hypertension patient monitored we will continue home medication with metoprolol and high diltiazem when he becomes able to take by mouth meanwhile he will be covered with hydralazine IV  Severity of Illness: The appropriate patient status for this patient is INPATIENT. Inpatient status is judged to be reasonable and necessary in order to provide the required intensity of service to ensure the patient's safety. The patient's presenting symptoms, physical exam findings, and initial radiographic and laboratory data in the context of their chronic comorbidities is felt to place them at high risk for further clinical deterioration. Furthermore, it is not anticipated that the patient will be medically stable for discharge from the hospital within 2 midnights of admission. The following factors support  the patient status of inpatient.   " The patient's presenting symptoms include acute abdominal pain severe shortness of breath. " The  worrisome physical exam findings include epigastric tenderness severe leukocytosis. " The initial radiographic and laboratory data are worrisome because of leukocytosis CT scan showing Coley cystitis. " The chronic co-morbidities include atrial fibrillation COPD congestive heart failure.   * I certify that at the point of admission it is my clinical judgment that the patient will require inpatient hospital care spanning beyond 2 midnights from the point of admission due to high intensity of service, high risk for further deterioration and high frequency of surveillance required.*    DVT prophylaxis: Heparin GTT  Code Status: DNR  Family Communication: No family at bedside I asked him if he had any children can  talk to "he said I have 3 children but you can forget about those"  Disposition Plan: To the nursing home when stable  Consults called: Surgery Dr. Marcello Moores  Admission status: Inpatient at the stepdown unit.  Patient admitted to inpatient due to hypoxia elevated WBC patient is quite congested with bilateral pneumonia is increased risk complication    Cristal Deer MD [facility] Pager [phone]  If 7PM-7AM, please contact night-coverage [email or web address] Password [email or web address]  05/08/2018, 9:05 AM

## 2018-05-08 NOTE — ED Provider Notes (Signed)
Medical screening examination/treatment/procedure(s) were conducted as a shared visit with non-physician practitioner(s) and myself.  I personally evaluated the patient during the encounter.  EKG Interpretation  Date/Time:  Sunday May 08 2018 03:25:00 EST Ventricular Rate:  77 PR Interval:    QRS Duration: 113 QT Interval:  434 QTC Calculation: 492 R Axis:   -61 Text Interpretation:  Sinus rhythm Prolonged PR interval Incomplete right bundle branch block Low voltage, precordial leads Consider anterior infarct a fib has resolved Confirmed by Pryor Curia [phone]) on 05/08/2018 4:03:06 AM   Patient is an 84 year old male with history of hypertension, hyperlipidemia, diabetes, COPD who is oxygen dependent, ischemic cardiomyopathy, atrial fibrillation on Eliquis who presents to the emergency department from his nursing home with nausea, vomiting, right upper quadrant sharp abdominal pain.  CT scan shows acute cholecystitis as well as bilateral pneumonia.  Also question possible empyema.  Will discuss with general surgery but will likely need medicine admission.  Will give vancomycin and Zosyn.  6:25 AM  D/w Dr. Marcello Moores with general surgery.  They will see patient in consult.  Will discuss with medicine for admission.  6:30 AM  D/w Dr. Olevia Bowens with hospitalist service will place admission orders to stepdown.  Patient has been updated with this plan as well.  We will keep him n.p.o.   I reviewed all nursing notes, vitals, pertinent previous records, EKGs, lab and urine results, imaging (as available).    Ward, Delice Bison, DO 05/08/18 [phone]

## 2018-05-08 NOTE — ED Notes (Signed)
ED TO INPATIENT HANDOFF REPORT  Name/Age/Gender [name] 84 y.o. male  Code Status    Code Status Orders  (From admission, onward)         Start     Ordered   05/08/18 0940  Do not attempt resuscitation (DNR)  Continuous    Question Answer Comment  In the event of cardiac or respiratory ARREST Do not call a "code blue"   In the event of cardiac or respiratory ARREST Do not perform Intubation, CPR, defibrillation or ACLS   In the event of cardiac or respiratory ARREST Use medication by any route, position, wound care, and other measures to relive pain and suffering. May use oxygen, suction and manual treatment of airway obstruction as needed for comfort.      05/08/18 0940        Code Status History    Date Active Date Inactive Code Status Order ID Comments User Context   05/08/2018 0636 05/08/2018 0940 Full Code [id number]  Reubin Milan, MD ED   04/16/2018 1001 04/20/2018 1931 DNR [id number]  Earlie Counts, NP Inpatient   04/12/2018 1609 04/16/2018 1001 Full Code [id number]  Janora Norlander, MD ED   04/22/2017 1609 04/24/2017 1712 Full Code [id number]  Alla Feeling, MD Inpatient   10/12/2016 1321 10/15/2016 2329 DNR [id number]  Radene Gunning, NP ED   10/12/2016 1321 10/12/2016 1321 DNR [id number]  Radene Gunning, NP ED   06/29/2016 2148 07/03/2016 1909 Full Code [id number]  Murlean Iba, MD Inpatient   06/29/2016 2148 06/29/2016 2148 Full Code [id number]  Murlean Iba, MD Inpatient   06/02/2016 1244 06/06/2016 1711 Full Code [id number]  Patrecia Pour, MD Inpatient   05/19/2016 1420 12020/01/1616 1925 Full Code [id number]  Hosie Poisson, MD Inpatient   12/08/2014 0012 12/12/2014 1849 Full Code [id number]  Toy Baker, MD Inpatient   11/21/2014 2317 12/04/2014 1731 Full Code [id number]  Toy Baker, MD ED    Advance Directive Documentation     Most Recent Value  Type of Advance Directive  Out of facility DNR (pink MOST or yellow form)  Pre-existing out of  facility DNR order (yellow form or pink MOST form)  -  "MOST" Form in Place?  -      Home/SNF/Other Home  Chief Complaint RUQ abdominal pain  Level of Care/Admitting Diagnosis ED Disposition    ED Disposition Condition [place]: [place] [[id number]]  Level of Care: Telemetry [5]  Admit to tele based on following criteria: Other see comments  Comments: Hypoxia ;pneumonia  Diagnosis: Pneumonia due to other specified infectious organisms [[id number]]  Admitting Physician: Cristal Deer [[id number]]  Attending Physician: Cristal Deer [[id number]]  Estimated length of stay: 3 - 4 days  Certification:: I certify this patient will need inpatient services for at least 2 midnights  PT Class (Do Not Modify): Inpatient [101]  PT Acc Code (Do Not Modify): Private [1]       Medical History Past Medical History:  Diagnosis Date  . ALLERGIC RHINITIS   . ANEMIA-NOS   . AORTIC SCLEROSIS   . Asthma   . CARDIOMYOPATHY, ISCHEMIC   . CAROTID BRUIT, RIGHT 02/27/2008  . Cataract    surgery  . CML (chronic myeloid leukemia) ([place]) 06/26/2015  . COPD   . CORONARY ARTERY DISEASE    a. s/p CABG in 1995 b. DES in 2008, 2009, and most recent in 2012 with DES to SVG-OM  .  DIABETES MELLITUS-TYPE II    diet controlled  . Diastolic dysfunction, Grade 1 11/24/2014  . Diverticulitis of colon with perforation 11/22/2014  . Diverticulosis   . GERD   . HIATAL HERNIA   . Hx of echocardiogram    Echo (9/15):  Mild LVH, EF 50-55%, no RWMA, Gr 1 DD, MAC, mild LAE.  [name] HYPERLIPIDEMIA   . HYPERTENSION   . Hyponatremia 11/22/2014  . IBS (irritable bowel syndrome)   . LACTOSE INTOLERANCE   . OA (osteoarthritis)   . OBESITY   . On home oxygen therapy    "3L all the time" (10/12/2016)  . Partial small bowel obstruction ([place])   . PERIPHERAL VASCULAR DISEASE   . Primary hyperparathyroidism ([place])    Lab Results Component Value Date  PTH 150.7* 02/13/2013   CALCIUM 11.0* 02/13/2013  CAION 1.21 03/15/2008    . Prostate cancer ([place])    seed implants 2004  . SICK SINUS/ TACHY-BRADY SYNDROME 09/2007   s/p PPM st judes  . Sleep apnea   . SMALL BOWEL OBSTRUCTION 04/18/2009   Qualifier: History of  By: Asa Lente MD, Jannifer Rodney Symptomatic diverticulosis 01/18/2009   Qualifier: Diagnosis of  By: Trellis Paganini PA-c, Amy S     Allergies Allergies  Allergen Reactions  . Actos [Pioglitazone Hydrochloride] Other (See Comments)    "felt funny, drowsy, and weak":  [name] Buprenorphine Hcl Nausea And Vomiting  . Celebrex [Celecoxib] Other (See Comments)    "felt funny"  . Demerol Palpitations and Other (See Comments)    Increased BP  . Meperidine Palpitations    Other reaction(s): Other (See Comments) Increased BP  . Morphine And Related Nausea And Vomiting  . Ciprofloxacin Other (See Comments)    arthralgia  . Metformin Nausea And Vomiting  . Zocor [Simvastatin] Other (See Comments)    Makes pt very drowsy    IV Location/Drains/Wounds Patient Lines/Drains/Airways Status   Active Line/Drains/Airways    Name:   Placement date:   Placement time:   Site:   Days:   Peripheral IV 05/08/18 Left;Upper Arm   05/08/18    0314    Arm   less than 1   Peripheral IV 05/08/18 Right Hand   05/08/18    0325    Hand   less than 1   Colostomy LLQ   12/03/14    0700    LLQ   1252          Labs/Imaging Results for orders placed or performed during the hospital encounter of 05/08/18 (from the past 48 hour(s))  Lipase, blood     Status: None   Collection Time: 05/08/18  3:28 AM  Result Value Ref Range   Lipase 20 11 - 51 U/L    Comment: Performed at [facility], [place] [address], [place], [place] [address]  Comprehensive metabolic panel     Status: Abnormal   Collection Time: 05/08/18  3:28 AM  Result Value Ref Range   Sodium 134 (L) 135 - 145 mmol/L   Potassium 3.3 (L) 3.5 - 5.1 mmol/L   Chloride 96 (L) 98 - 111 mmol/L   CO2 28 22 - 32  mmol/L   Glucose, Bld 171 (H) 70 - 99 mg/dL   BUN 21 8 - 23 mg/dL   Creatinine, Ser 1.41 (H) 0.61 - 1.24 mg/dL   Calcium 9.4 8.9 - 10.3 mg/dL   Total Protein 7.5 6.5 - 8.1 g/dL   Albumin 3.6 3.5 - 5.0 g/dL  AST 16 15 - 41 U/L   ALT 14 0 - 44 U/L   Alkaline Phosphatase 75 38 - 126 U/L   Total Bilirubin 1.6 (H) 0.3 - 1.2 mg/dL   GFR calc non Af Amer 44 (L) >60 mL/min   GFR calc Af Amer 51 (L) >60 mL/min   Anion gap 10 5 - 15    Comment: Performed at [facility], [place] [address] [place], [place] [address]  CBC     Status: Abnormal   Collection Time: 05/08/18  3:28 AM  Result Value Ref Range   WBC 11.7 (H) 4.0 - 10.5 K/uL   RBC 3.41 (L) 4.22 - 5.81 MIL/uL   Hemoglobin 10.3 (L) 13.0 - 17.0 g/dL   HCT 33.4 (L) 39.0 - 52.0 %   MCV 97.9 80.0 - 100.0 fL   MCH 30.2 26.0 - 34.0 pg   MCHC 30.8 30.0 - 36.0 g/dL   RDW 13.8 11.5 - 15.5 %   Platelets 161 150 - 400 K/uL   nRBC 0.0 0.0 - 0.2 %    Comment: Performed at [facility], [place] [address], [place], [place] [address]  Urinalysis, Routine w reflex microscopic     Status: Abnormal   Collection Time: 05/08/18  3:28 AM  Result Value Ref Range   Color, Urine YELLOW YELLOW   APPearance CLEAR CLEAR   Specific Gravity, Urine >1.046 (H) 1.005 - 1.030   pH 5.0 5.0 - 8.0   Glucose, UA NEGATIVE NEGATIVE mg/dL   Hgb urine dipstick MODERATE (A) NEGATIVE   Bilirubin Urine NEGATIVE NEGATIVE   Ketones, ur 5 (A) NEGATIVE mg/dL   Protein, ur 30 (A) NEGATIVE mg/dL   Nitrite NEGATIVE NEGATIVE   Leukocytes, UA NEGATIVE NEGATIVE   RBC / HPF 11-20 0 - 5 RBC/hpf   WBC, UA 0-5 0 - 5 WBC/hpf   Bacteria, UA NONE SEEN NONE SEEN   Squamous Epithelial / LPF 0-5 0 - 5    Comment: Performed at [facility], [place] [address], [place], [place] [address]  Magnesium     Status: None   Collection Time: 05/08/18  3:28 AM  Result Value Ref Range   Magnesium 2.0 1.7 - 2.4 mg/dL    Comment: Performed at [facility], [place] [address] [place], [place] [address]  Phosphorus     Status: Abnormal   Collection Time: 05/08/18  3:28 AM  Result Value Ref Range   Phosphorus 2.4 (L) 2.5 - 4.6 mg/dL    Comment: Performed at [facility], [place] [address], [place], [place] [address]  Strep pneumoniae urinary antigen     Status: None   Collection Time: 05/08/18  9:48 AM  Result Value Ref Range   Strep Pneumo Urinary Antigen NEGATIVE NEGATIVE    Comment:        Infection due to S. pneumoniae cannot be absolutely ruled out since the antigen present may be below the detection limit of the test. Performed at [facility], [address] [place],  [address]   Blood gas, arterial     Status: Abnormal   Collection Time: 05/08/18  2:39 PM  Result Value Ref Range   O2 Content 3.0 L/min   Delivery systems NASAL CANNULA    pH, Arterial 7.420 7.350 - 7.450   pCO2 arterial 42.3 32.0 - 48.0 mmHg   pO2, Arterial 61.7 (L) 83.0 - 108.0 mmHg   Bicarbonate 26.9 20.0 - 28.0  mmol/L   Acid-Base Excess 2.7 (H) 0.0 - 2.0 mmol/L   O2 Saturation 89.1 %   Patient temperature 98.6    Collection site LEFT BRACHIAL    Drawn by [id number]    Sample type ARTERIAL DRAW     Comment: Performed at [facility], [place] [address], [place], [place] [address]   Ct Abdomen Pelvis W Contrast  Result Date: 05/08/2018 CLINICAL DATA:  Initial evaluation for acute right upper quadrant pain, cough and congestion. EXAM: CT ABDOMEN AND PELVIS WITH CONTRAST TECHNIQUE: Multidetector CT imaging of the abdomen and pelvis was performed using the standard protocol following bolus administration of intravenous contrast. CONTRAST:  173m ISOVUE-300 IOPAMIDOL (ISOVUE-300) INJECTION 61% COMPARISON:  Prior CT from 12/07/2014. FINDINGS: Lower chest: Dense consolidative opacities present within the bilateral lung bases, right slightly worse than left. Superimposed peripheral pleural based  calcifications noted. Associated bilateral pleural effusions, complex on the left with loculated appearance. Ill-defined density within the left effusion noted (series 2, image 10). Mild cardiomegaly. Cardiac pacemaker electrodes partially visualized. Hepatobiliary: Multiple scattered cysts seen within the liver, largest of which position within the left hepatic lobe in measures 4.4 cm, similar to previous. Liver demonstrates no acute finding. Gallbladder is enlarged and hydropic in appearance with hazy pericholecystic fat stranding, suspicious for possible acute cholecystitis. No radiopaque calculi identified. No biliary dilatation. Pancreas: Diffuse fatty infiltration of the pancreas noted. Pancreas otherwise unremarkable without acute peripancreatic inflammation. Spleen: Spleen within normal limits. Adrenals/Urinary Tract: Adrenal glands are normal. Kidneys equal in size with symmetric enhancement. 3.4 cm simple left renal cyst noted. Additional scattered subcentimeter hypodensities too small the characterize, but statistically likely reflects small cysts as well. No nephrolithiasis or hydronephrosis. No focal enhancing renal mass. No hydroureter. Partially distended bladder within normal limits. Stomach/Bowel: Stomach within normal limits. No evidence for bowel obstruction. Patient is status post sigmoid colectomy with left lower quadrant colostomy in place. Large parastomal hernia containing fat and loops of small bowel present within the left lower quadrant without associated inflammation or obstruction. Neck of the hernia measures approximately 4.1 cm in diameter. Additional ventral hernia seen just to the right of midline at the paraumbilical region contains fat and a portion of the transverse colon (series 2, image 48). No associated complication. Vascular/Lymphatic: Advanced aorto bi-iliac atherosclerotic disease. No aneurysm. Mesenteric vessels are patent proximally. No adenopathy. Reproductive:  Brachytherapy seeds overlie the prostate. Other: No free air or fluid. Musculoskeletal: Right total hip arthroplasty in place. No acute osseus abnormality. No discrete lytic or blastic osseous lesions. IMPRESSION: 1. Enlarged and hydropic gallbladder with hazy pericholecystic fat stranding, suspicious for acute cholecystitis. Correlation with laboratory values recommended. Additionally, further evaluation with dedicated right upper quadrant ultrasound could be performed for further evaluation as clinically warranted. 2. Dense consolidative opacities within the bilateral lung bases, right greater than left, concerning for infiltrates/pneumonia. Associated bilateral pleural effusions, complex in appearance on the left with increased internal density, raising the possibility for empyema. Please note that a malignant process could also have this appearance. Correlation with fluid analysis may be helpful for further evaluation as clinically warranted. 3. Large left lower quadrant parastomal hernia containing loops of small bowel without associated inflammation or obstruction. 4. Advanced aorto bi-iliac atherosclerotic disease. No aneurysm. Electronically Signed   By: BJeannine BogaM.D.   On: 05/08/2018 05:42   UKoreaAbdomen Limited  Result Date: 05/08/2018 CLINICAL DATA:  Initial evaluation for acute right upper quadrant pain. EXAM: ULTRASOUND ABDOMEN LIMITED RIGHT UPPER QUADRANT COMPARISON:  CT from same day. Please note that CT was performed following this examination, although this exam only became available for review after the CT was completed. FINDINGS: Gallbladder: No stones or sludge within the gallbladder lumen. Gallbladder wall measure within normal limits at 2.3 mm. No free pericholecystic fluid. A positive sonographic Murphy sign was elicited on exam. Common bile duct: Diameter: 5 mm Liver: Multiple scattered hepatic cysts noted, largest of which is septated and measures 4.4 cm in the left hepatic  lobe. These are grossly stable from prior CTs. Within normal limits in parenchymal echogenicity. Portal vein is patent on color Doppler imaging with normal direction of blood flow towards the liver. IMPRESSION: 1. Positive Murphy sign without additional sonographic features for acute cholecystitis. No cholelithiasis. 2. No biliary dilatation. 3. Multiple scattered hepatic cysts, better evaluated on previous CTs. Electronically Signed   By: Jeannine Boga M.D.   On: 05/08/2018 06:42   EKG Interpretation  Date/Time:  _0 /01/19 0603          Vitals/Pain Today's Vitals   05/08/18 1300 05/08/18 1329 05/08/18 1744 05/08/18 1900  BP: (!) 135/40  112/77 (!) 138/45  Pulse: 82  84 90  Resp: (!) 22  (!) 22 (!) 23  Temp:      TempSrc:      SpO2: (!) 88%  94% 94%  PainSc:  5       Isolation Precautions No active isolations  Medications Medications  sodium chloride (PF) 0.9 % injection (has no administration in time range)  heparin injection 5,000 Units (5,000 Units Subcutaneous Given 05/08/18  0840)  acetaminophen (TYLENOL) tablet 650 mg (has no administration in time range)    Or  acetaminophen (TYLENOL) suppository 650 mg (has no administration in time range)  ondansetron (ZOFRAN) tablet 4 mg ( Oral See Alternative 05/08/18 0949)    Or  ondansetron (ZOFRAN) injection 4 mg (4 mg Intravenous Given 05/08/18 0949)  0.9 % NaCl with KCl 20 mEq/ L  infusion ( Intravenous New Bag/Given 05/08/18 0741)  ceFEPIme (MAXIPIME) 1 g in sodium chloride 0.9 % 100 mL IVPB (0 g Intravenous Stopped 05/08/18 1635)  metroNIDAZOLE (FLAGYL) IVPB 500 mg (0 mg Intravenous Stopped 05/08/18 0949)  HYDROmorphone (DILAUDID) injection 0.5 mg (0.5 mg Intravenous Given 05/08/18 1309)  vancomycin (VANCOCIN) 1,500 mg in sodium chloride 0.9 % 500 mL IVPB (has no administration in time range)  HYDROmorphone (DILAUDID) injection 0.5 mg (0.5 mg Intravenous Given 05/08/18 0330)  HYDROmorphone (DILAUDID) injection 0.5 mg (0.5 mg Intravenous Given 05/08/18 0450)  iopamidol (ISOVUE-300) 61 % injection 100 mL (100 mLs Intravenous Contrast Given 05/08/18 0507)  vancomycin (VANCOCIN) IVPB 1000 mg/200 mL premix (0 mg Intravenous Stopped 05/08/18 0749)  vancomycin (VANCOCIN) IVPB 1000 mg/200 mL premix (0 mg Intravenous Stopped 05/08/18 1151)    Mobility walks with person assist

## 2018-05-08 NOTE — Progress Notes (Signed)
Called to [facility] ED room 25 to assisted PT with clearing secretions from throat. Placed Yankauer in PT mouth and encouraged coughing- resulted in small, thick yellow mucus. PT states he does cough a great deal when he eats and or drinks (RN aware) Congestion seems to be in throat- otherwise BBS diminished clear. Also, had PT perform some deep breathing and coughing- PT states he is in pain- RN aware (right abdominal area)- which is hindering PT from coughing hard. PT has Flutter device at bedside and demonstrates hands on understanding of Flutter device. RN has also been instructed and encouraged to use Flutter and or deep breathing and coughing with PT. PT states he feels he is breathing fine at this time- his biggest concern is his pain. PT is 3 lpm 02 dependent- current Sp02 90-92%.

## 2018-05-08 NOTE — Consult Note (Signed)
Reason for Consult: RUQ pain Referring Physician: Dr Delton Prairie is an 84 y.o. male.  HPI: 84 y.o. M who resides in a SNF presents to the ED with ~24h of RUQ pain.  Denies nausea or fevers.  Has a cough as well.    Past Medical History:  Diagnosis Date  . ALLERGIC RHINITIS   . ANEMIA-NOS   . AORTIC SCLEROSIS   . Asthma   . CARDIOMYOPATHY, ISCHEMIC   . CAROTID BRUIT, RIGHT 02/27/2008  . Cataract    surgery  . CML (chronic myeloid leukemia) ([place]) 06/26/2015  . COPD   . CORONARY ARTERY DISEASE    a. s/p CABG in 1995 b. DES in 2008, 2009, and most recent in 2012 with DES to SVG-OM  . DIABETES MELLITUS-TYPE II    diet controlled  . Diastolic dysfunction, Grade 1 11/24/2014  . Diverticulitis of colon with perforation 11/22/2014  . Diverticulosis   . GERD   . HIATAL HERNIA   . Hx of echocardiogram    Echo (9/15):  Mild LVH, EF 50-55%, no RWMA, Gr 1 DD, MAC, mild LAE.  [name] HYPERLIPIDEMIA   . HYPERTENSION   . Hyponatremia 11/22/2014  . IBS (irritable bowel syndrome)   . LACTOSE INTOLERANCE   . OA (osteoarthritis)   . OBESITY   . On home oxygen therapy    "3L all the time" (10/12/2016)  . Partial small bowel obstruction ([place])   . PERIPHERAL VASCULAR DISEASE   . Primary hyperparathyroidism ([place])    Lab Results Component Value Date  PTH 150.7* 02/13/2013  CALCIUM 11.0* 02/13/2013  CAION 1.21 03/15/2008    . Prostate cancer ([place])    seed implants 2004  . SICK SINUS/ TACHY-BRADY SYNDROME 09/2007   s/p PPM st judes  . Sleep apnea   . SMALL BOWEL OBSTRUCTION 04/18/2009   Qualifier: History of  By: Asa Lente MD, Jannifer Rodney Symptomatic diverticulosis 01/18/2009   Qualifier: Diagnosis of  By: Shane Crutch, Amy S     Past Surgical History:  Procedure Laterality Date  . BACK SURGERY    . Bilateral cataracts    . COLON RESECTION N/A 11/28/2014   Procedure: EXPLORATORY LAPAROTOMY, SIGMOID COLECTOMY WITH COLOSTOMY;  Surgeon: Jackolyn Confer, MD;  Location: [facility];  Service: General;   Laterality: N/A;  . COLON SURGERY    . COLONOSCOPY    . CORONARY ARTERY BYPASS GRAFT    . ESOPHAGOGASTRODUODENOSCOPY  multiple  . FLEXIBLE SIGMOIDOSCOPY N/A 09/22/2013   Procedure: FLEXIBLE SIGMOIDOSCOPY;  Surgeon: Gatha Mayer, MD;  Location: [facility];  Service: Endoscopy;  Laterality: N/A;  . INGUINAL HERNIA REPAIR Bilateral   . LUMBAR [place] SURGERY  12/2008  . PACEMAKER INSERTION     DDD/[employer]         Last interrogation 2/13  on chart     Pacemaker guideline order Dr Tamala Julian on chart  . Partial small bowel obstruction  2009  . PENILE PROSTHESIS PLACEMENT    . PTCA  2008, 2009, 2012   with DES  . REMOVAL OF PENILE PROSTHESIS N/A 04/22/2017   Procedure: EXPLANT OF MULTICOMPONENT PENILE PROSTHESIS;  Surgeon: Irine Seal, MD;  Location: [facility];  Service: Urology;  Laterality: N/A;  . TOTAL HIP ARTHROPLASTY  08/21/2011   Procedure: TOTAL HIP ARTHROPLASTY;  Surgeon: Johnn Hai, MD;  Location: [facility];  Service: Orthopedics;  Laterality: Right;    Family History  Problem Relation Age of Onset  . Hypertension Mother   .  Cancer Mother   . Heart attack Unknown   . Heart attack Unknown   . Heart attack Brother   . Colon cancer Neg Hx   . Stroke Neg Hx     Social History:  reports that he quit smoking about 23 years ago. His smoking use included cigarettes. He has a 25.00 pack-year smoking history. He has never used smokeless tobacco. He reports that he does not drink alcohol or use drugs.  Allergies:  Allergies  Allergen Reactions  . Actos [Pioglitazone Hydrochloride] Other (See Comments)    "felt funny, drowsy, and weak":  [name] Buprenorphine Hcl Nausea And Vomiting  . Celebrex [Celecoxib] Other (See Comments)    "felt funny"  . Demerol Palpitations and Other (See Comments)    Increased BP  . Meperidine Palpitations    Other reaction(s): Other (See Comments) Increased BP  . Morphine And Related Nausea And Vomiting  . Ciprofloxacin Other (See Comments)    arthralgia   . Metformin Nausea And Vomiting  . Zocor [Simvastatin] Other (See Comments)    Makes pt very drowsy    Medications: I have reviewed the patient's current medications.  Results for orders placed or performed during the hospital encounter of 05/08/18 (from the past 48 hour(s))  Lipase, blood     Status: None   Collection Time: 05/08/18  3:28 AM  Result Value Ref Range   Lipase 20 11 - 51 U/L    Comment: Performed at [facility], [place] [address] [place], [place] [address]  Comprehensive metabolic panel     Status: Abnormal   Collection Time: 05/08/18  3:28 AM  Result Value Ref Range   Sodium 134 (L) 135 - 145 mmol/L   Potassium 3.3 (L) 3.5 - 5.1 mmol/L   Chloride 96 (L) 98 - 111 mmol/L   CO2 28 22 - 32 mmol/L   Glucose, Bld 171 (H) 70 - 99 mg/dL   BUN 21 8 - 23 mg/dL   Creatinine, Ser 1.41 (H) 0.61 - 1.24 mg/dL   Calcium 9.4 8.9 - 10.3 mg/dL   Total Protein 7.5 6.5 - 8.1 g/dL   Albumin 3.6 3.5 - 5.0 g/dL   AST 16 15 - 41 U/L   ALT 14 0 - 44 U/L   Alkaline Phosphatase 75 38 - 126 U/L   Total Bilirubin 1.6 (H) 0.3 - 1.2 mg/dL   GFR calc non Af Amer 44 (L) >60 mL/min   GFR calc Af Amer 51 (L) >60 mL/min   Anion gap 10 5 - 15    Comment: Performed at [facility], [place] [address], [place], [place] [address]  CBC     Status: Abnormal   Collection Time: 05/08/18  3:28 AM  Result Value Ref Range   WBC 11.7 (H) 4.0 - 10.5 K/uL   RBC 3.41 (L) 4.22 - 5.81 MIL/uL   Hemoglobin 10.3 (L) 13.0 - 17.0 g/dL   HCT 33.4 (L) 39.0 - 52.0 %   MCV 97.9 80.0 - 100.0 fL   MCH 30.2 26.0 - 34.0 pg   MCHC 30.8 30.0 - 36.0 g/dL   RDW 13.8 11.5 - 15.5 %   Platelets 161 150 - 400 K/uL   nRBC 0.0 0.0 - 0.2 %    Comment: Performed at [facility], [place] [address], [place], [place] [address]  Magnesium     Status: None   Collection Time: 05/08/18  3:28 AM  Result Value Ref Range   Magnesium  2.0 1.7 - 2.4 mg/dL    Comment: Performed at [facility], [place] [address] [place], [place] [address]  Phosphorus     Status: Abnormal   Collection Time: 05/08/18  3:28 AM  Result Value Ref Range   Phosphorus 2.4 (L) 2.5 - 4.6 mg/dL    Comment: Performed at [facility], [place] [address] [place], [place] [address]    Ct Abdomen Pelvis W Contrast  Result Date: 05/08/2018 CLINICAL DATA:  Initial evaluation for acute right upper quadrant pain, cough and congestion. EXAM: CT ABDOMEN AND PELVIS WITH CONTRAST TECHNIQUE: Multidetector CT imaging of the abdomen and pelvis was performed using the standard protocol following bolus administration of intravenous contrast. CONTRAST:  149m ISOVUE-300 IOPAMIDOL (ISOVUE-300) INJECTION 61% COMPARISON:  Prior CT from 12/07/2014. FINDINGS: Lower chest: Dense consolidative opacities present within the bilateral lung bases, right slightly worse than left. Superimposed peripheral pleural based calcifications noted. Associated bilateral pleural effusions, complex on the left with loculated appearance. Ill-defined density within the left effusion noted (series 2, image 10). Mild cardiomegaly. Cardiac pacemaker electrodes partially visualized. Hepatobiliary: Multiple scattered cysts seen within the liver, largest of which position within the left hepatic lobe in measures 4.4 cm, similar to previous. Liver demonstrates no acute finding. Gallbladder is enlarged and hydropic in appearance with hazy pericholecystic fat stranding, suspicious for possible acute cholecystitis. No radiopaque calculi identified. No biliary dilatation. Pancreas: Diffuse fatty infiltration of the pancreas noted. Pancreas otherwise unremarkable without acute peripancreatic inflammation. Spleen: Spleen within normal limits. Adrenals/Urinary Tract: Adrenal glands are normal. Kidneys equal in size with symmetric enhancement. 3.4 cm simple left renal cyst noted. Additional scattered subcentimeter hypodensities too small the  characterize, but statistically likely reflects small cysts as well. No nephrolithiasis or hydronephrosis. No focal enhancing renal mass. No hydroureter. Partially distended bladder within normal limits. Stomach/Bowel: Stomach within normal limits. No evidence for bowel obstruction. Patient is status post sigmoid colectomy with left lower quadrant colostomy in place. Large parastomal hernia containing fat and loops of small bowel present within the left lower quadrant without associated inflammation or obstruction. Neck of the hernia measures approximately 4.1 cm in diameter. Additional ventral hernia seen just to the right of midline at the paraumbilical region contains fat and a portion of the transverse colon (series 2, image 48). No associated complication. Vascular/Lymphatic: Advanced aorto bi-iliac atherosclerotic disease. No aneurysm. Mesenteric vessels are patent proximally. No adenopathy. Reproductive: Brachytherapy seeds overlie the prostate. Other: No free air or fluid. Musculoskeletal: Right total hip arthroplasty in place. No acute osseus abnormality. No discrete lytic or blastic osseous lesions. IMPRESSION: 1. Enlarged and hydropic gallbladder with hazy pericholecystic fat stranding, suspicious for acute cholecystitis. Correlation with laboratory values recommended. Additionally, further evaluation with dedicated right upper quadrant ultrasound could be performed for further evaluation as clinically warranted. 2. Dense consolidative opacities within the bilateral lung bases, right greater than left, concerning for infiltrates/pneumonia. Associated bilateral pleural effusions, complex in appearance on the left with increased internal density, raising the possibility for empyema. Please note that a malignant process could also have this appearance. Correlation with fluid analysis may be helpful for further evaluation as clinically warranted. 3. Large left lower quadrant parastomal hernia containing loops  of small bowel without associated inflammation or obstruction. 4. Advanced aorto bi-iliac atherosclerotic disease. No aneurysm. Electronically Signed   By: BJeannine BogaM.D.   On: 05/08/2018 05:42   [place]Abdomen Limited  Result Date: 05/08/2018 CLINICAL DATA:  Initial evaluation for acute right  upper quadrant pain. EXAM: ULTRASOUND ABDOMEN LIMITED RIGHT UPPER QUADRANT COMPARISON:  CT from same day. Please note that CT was performed following this examination, although this exam only became available for review after the CT was completed. FINDINGS: Gallbladder: No stones or sludge within the gallbladder lumen. Gallbladder wall measure within normal limits at 2.3 mm. No free pericholecystic fluid. A positive sonographic Murphy sign was elicited on exam. Common bile duct: Diameter: 5 mm Liver: Multiple scattered hepatic cysts noted, largest of which is septated and measures 4.4 cm in the left hepatic lobe. These are grossly stable from prior CTs. Within normal limits in parenchymal echogenicity. Portal vein is patent on color Doppler imaging with normal direction of blood flow towards the liver. IMPRESSION: 1. Positive Murphy sign without additional sonographic features for acute cholecystitis. No cholelithiasis. 2. No biliary dilatation. 3. Multiple scattered hepatic cysts, better evaluated on previous CTs. Electronically Signed   By: Jeannine Boga M.D.   On: 05/08/2018 06:42    Review of Systems  Constitutional: Negative for chills and fever.  HENT: Negative for hearing loss.   Eyes: Negative for blurred vision.  Respiratory: Positive for cough and sputum production.   Cardiovascular: Negative for chest pain.  Gastrointestinal: Positive for abdominal pain. Negative for nausea and vomiting.  Genitourinary: Negative for dysuria, frequency and urgency.  Musculoskeletal: Negative for myalgias.  Skin: Negative for rash.  Neurological: Negative for headaches.   Blood pressure (!) 168/56,  pulse 80, temperature 98.4 F (36.9 C), temperature source Oral, resp. rate (!) 21, SpO2 95 %. Physical Exam  Constitutional: He is oriented to person, place, and time. He appears well-developed and well-nourished. No distress.  HENT:  Head: Normocephalic and atraumatic.  Eyes: Pupils are equal, round, and reactive to light. Conjunctivae and EOM are normal.  Neck: Normal range of motion. Neck supple.  Cardiovascular: Normal rate and regular rhythm.  Respiratory: Effort normal. No respiratory distress.  GI: Soft. There is tenderness (RUQ). There is no rebound and no guarding.  Musculoskeletal: Normal range of motion.  Neurological: He is alert and oriented to person, place, and time.  Skin: Skin is warm and dry.    Assessment/Plan: 84 y.o. M who presents to the ED with RUQ pain.  He appears to have bilateral pneumonia with effusions.  RUQ [place] (the most sensitive test for GB pathology) not concerning for cholecystitis.  Cont with medical management for lung pathology.  Follow LFT's.  Keep NPO for now.  Hold Elliquis.  Will follow with you.  Rosario Adie 01/0/2725, 7:13 AM

## 2018-05-08 NOTE — Progress Notes (Signed)
Pharmacy Antibiotic Note  Eric [name] is a 84 y.o. male admitted from nursing home on 05/08/2018 with RUQ pain and congested cough. Has colostomy. Cefepime and vanc started for HCAP/sepsis. Flagyl started for possible intra-abdominal infection. Pharmacy has been consulted for vancomycin dosing. 1g given in the ED. Estimated weight is 210 pounds. SCr is elevated, CrCl 60ml/min/1.73m2  Plan: Vancomycin 1g + 1g IV as loading dose then 1.5g IV q48h to start on 05/10/18. Measure Vanc peak and trough at steady state. Goal AUC = 400 - 500 for all indications, except meningitis (goal AUC > 500 and Cmin 15-20 mcg/mL) Follow up renal function, culture results, and clinical course.   Temp (24hrs), Avg:98.4 F (36.9 C), Min:98.4 F (36.9 C), Max:98.4 F (36.9 C)  Recent Labs  Lab 05/08/18 0328  WBC 11.7*  CREATININE 1.41*    CrCl cannot be calculated (Unknown ideal weight.).    Allergies  Allergen Reactions  . Actos [Pioglitazone Hydrochloride] Other (See Comments)    "felt funny, drowsy, and weak":  [name] Buprenorphine Hcl Nausea And Vomiting  . Celebrex [Celecoxib] Other (See Comments)    "felt funny"  . Demerol Palpitations and Other (See Comments)    Increased BP  . Meperidine Palpitations    Other reaction(s): Other (See Comments) Increased BP  . Morphine And Related Nausea And Vomiting  . Ciprofloxacin Other (See Comments)    arthralgia  . Metformin Nausea And Vomiting  . Zocor [Simvastatin] Other (See Comments)    Makes pt very drowsy    Antimicrobials this admission: 12/1 Flagyl >> 12/1 Cefepime >>  12/1 Vanc >>   Dose adjustments this admission:   Microbiology results: BCx:  UCx:  Sputum: MRSA PCR:  Thank you for allowing pharmacy to be a part of this patient's care.

## 2018-05-08 NOTE — ED Notes (Signed)
Pt attempted to give urine sample but was unable. Urinal at bedside.

## 2018-05-08 NOTE — ED Triage Notes (Signed)
Patient presents by [facility] from [facility] with complaints of RUQ pain. Pain started at 2100 last night. EMS reports that [facility] gave him Morphine for the pain. Pain is 8-9/10 "sharp burn" to RUQ area-patient is O2 dependent and has a very coarse congested cough. Patient has a colostomy lower abd area.  Patient told EMS he vomited x 1 earlier.

## 2018-05-08 NOTE — ED Notes (Signed)
Bed: TU84 Expected date:  Expected time:  Means of arrival:  Comments: EMS 84 yo male upper right quad pain

## 2018-05-08 NOTE — ED Provider Notes (Addendum)
[facility] DEPT Provider Note   CSN: [id number] Arrival date & time: 05/08/18  0244     History   Chief Complaint Chief Complaint  Patient presents with  . Abdominal Pain    RUQ    HPI Eric [name] is a 84 y.o. male.  Patient to ED from nursing home for evaluation of sharp, intense RUQ abdominal pain that started around 9:00 pm last night and has been constant since onset. The pain radiates around to the back. He reports nausea with one episode of vomiting after he was given morphine by EMS which has caused N/V in the past. No fever. No alleviating factors. He has a history of oxygen dependent COPD with chronic cough that is unchanged, however, he states his cough makes the abdominal pain worse. He has a colostomy in the LLQ which he states has had normal volume output. No urinary symptoms.   The history is provided by the patient and the EMS personnel. No language interpreter was used.  Abdominal Pain   Associated symptoms include nausea and vomiting. Pertinent negatives include fever, dysuria and hematuria.    Past Medical History:  Diagnosis Date  . ALLERGIC RHINITIS   . ANEMIA-NOS   . AORTIC SCLEROSIS   . Asthma   . CARDIOMYOPATHY, ISCHEMIC   . CAROTID BRUIT, RIGHT 02/27/2008  . Cataract    surgery  . CML (chronic myeloid leukemia) ([place]) 06/26/2015  . COPD   . CORONARY ARTERY DISEASE    a. s/p CABG in 1995 b. DES in 2008, 2009, and most recent in 2012 with DES to SVG-OM  . DIABETES MELLITUS-TYPE II    diet controlled  . Diastolic dysfunction, Grade 1 11/24/2014  . Diverticulitis of colon with perforation 11/22/2014  . Diverticulosis   . GERD   . HIATAL HERNIA   . Hx of echocardiogram    Echo (9/15):  Mild LVH, EF 50-55%, no RWMA, Gr 1 DD, MAC, mild LAE.  [name] HYPERLIPIDEMIA   . HYPERTENSION   . Hyponatremia 11/22/2014  . IBS (irritable bowel syndrome)   . LACTOSE INTOLERANCE   . OA (osteoarthritis)   . OBESITY   . On home oxygen  therapy    "3L all the time" (10/12/2016)  . Partial small bowel obstruction ([place])   . PERIPHERAL VASCULAR DISEASE   . Primary hyperparathyroidism ([place])    Lab Results Component Value Date  PTH 150.7* 02/13/2013  CALCIUM 11.0* 02/13/2013  CAION 1.21 03/15/2008    . Prostate cancer ([place])    seed implants 2004  . SICK SINUS/ TACHY-BRADY SYNDROME 09/2007   s/p PPM st jud[facility]  . Sleep apnea   . SMALL BOWEL OBSTRUCTION 04/18/2009   Qualifier: History of  By: Asa Lente MD, Jannifer Rodney Symptomatic diverticulosis 01/18/2009   Qualifier: Diagnosis of  By: Shane Crutch, Amy S     Patient Active Problem List   Diagnosis Date Noted  . Palliative care by specialist   . Atrial fibrillation with RVR ([place]) 04/12/2018  . Erosion of penile prosthesis ([place]) 04/22/2017  . Chronic respiratory failure with hypoxia ([place]) 10/12/2016  . Anxiety state 02-12-2017  . Anemia of chronic disease 07/18/2015  . Tachy-brady syndrome ([place]) 07/10/2015  . Bilateral carotid artery stenosis 07/10/2015  . Essential hypertension 07/10/2015  . Myeloproliferative neoplasm ([place]) 06/25/2015  . Obesity (BMI 30-39.9) 12/08/2014  . Hartmann's pouch of intestine ([place]) 12/08/2014  . CAD (coronary artery disease) of artery bypass graft 11/28/2014  . Chronic diastolic  heart failure ([place]) 11/24/2014  . Obstructive sleep apnea 04/11/2011  . Type 2 diabetes mellitus, controlled ([place]) 01/18/2009  . Dyslipidemia 01/18/2009  . Hypertensive heart disease with CHF ([place]) 01/18/2009  . GERD 01/18/2009  . COPD mixed type ([place]) 12/27/2006    Past Surgical History:  Procedure Laterality Date  . BACK SURGERY    . Bilateral cataracts    . COLON RESECTION N/A 11/28/2014   Procedure: EXPLORATORY LAPAROTOMY, SIGMOID COLECTOMY WITH COLOSTOMY;  Surgeon: Jackolyn Confer, MD;  Location: [facility];  Service: General;  Laterality: N/A;  . COLON SURGERY    . COLONOSCOPY    . CORONARY ARTERY BYPASS GRAFT    . ESOPHAGOGASTRODUODENOSCOPY  multiple  .  FLEXIBLE SIGMOIDOSCOPY N/A 09/22/2013   Procedure: FLEXIBLE SIGMOIDOSCOPY;  Surgeon: Gatha Mayer, MD;  Location: [facility];  Service: Endoscopy;  Laterality: N/A;  . INGUINAL HERNIA REPAIR Bilateral   . LUMBAR [place] SURGERY  12/2008  . PACEMAKER INSERTION     DDD/[employer]         Last interrogation 2/13  on chart     Pacemaker guideline order Dr Tamala Julian on chart  . Partial small bowel obstruction  2009  . PENILE PROSTHESIS PLACEMENT    . PTCA  2008, 2009, 2012   with DES  . REMOVAL OF PENILE PROSTHESIS N/A 04/22/2017   Procedure: EXPLANT OF MULTICOMPONENT PENILE PROSTHESIS;  Surgeon: Irine Seal, MD;  Location: [facility];  Service: Urology;  Laterality: N/A;  . TOTAL HIP ARTHROPLASTY  08/21/2011   Procedure: TOTAL HIP ARTHROPLASTY;  Surgeon: Johnn Hai, MD;  Location: [facility];  Service: Orthopedics;  Laterality: Right;        Home Medications    Prior to Admission medications   Medication Sig Start Date End Date Taking? Authorizing Provider  acetaminophen (TYLENOL) 500 MG tablet Take 1,000 mg by mouth every 6 (six) hours as needed for mild pain or fever.    [provider]  amiodarone (PACERONE) 200 MG tablet Take 1 tablet (200 mg total) by mouth 2 (two) times daily. 04/20/18   Kinnie Feil, MD  apixaban (ELIQUIS) 2.5 MG TABS tablet Take 1 tablet (2.5 mg total) by mouth 2 (two) times daily. 04/20/18   Kinnie Feil, MD  aspirin EC 81 MG EC tablet Take 1 tablet (81 mg total) by mouth daily. 04/21/18   Buriev, Arie Sabina, MD  budesonide (PULMICORT) 0.25 MG/2ML nebulizer solution Take 2 mLs (0.25 mg total) by nebulization 2 (two) times daily. Patient not taking: Reported on 04/12/2018 12/06/17   Lauraine Rinne, NP  diltiazem (CARDIZEM CD) 180 MG 24 hr capsule Take 1 capsule (180 mg total) by mouth daily. 04/21/18   Kinnie Feil, MD  fluticasone (FLONASE) 50 MCG/ACT nasal spray Place 2 sprays into both nostrils daily. Allergies 08/01/14   [name], MD    furosemide (LASIX) 40 MG tablet Take 1 tablet (40 mg total) by mouth 2 (two) times daily. 03/31/18   Belva Crome, MD  glucose blood (ONE TOUCH ULTRA TEST) test strip USE TO CHECK BLOOD SUGARS ONCE A DAY 10/28/17   [name], MD  guaiFENesin-dextromethorphan [facility] DM) 100-10 MG/5ML syrup Take 5 mLs by mouth every 4 (four) hours as needed for cough. 10/15/16   Lavina Hamman, MD  insulin aspart (NOVOLOG) 100 UNIT/ML injection Inject 0-5 Units into the skin at bedtime. Insulin sliding scale 04/20/18   Buriev, Arie Sabina, MD  ipratropium-albuterol (DUONEB) 0.5-2.5 (3) MG/3ML  SOLN Take 3 mLs by nebulization 3 (three) times daily.  02/17/17   [provider]  isosorbide mononitrate (IMDUR) 30 MG 24 hr tablet Take 0.5 tablets (15 mg total) by mouth daily. 04/21/18   Kinnie Feil, MD  nitroGLYCERIN (NITROSTAT) 0.4 MG SL tablet Place 0.4 mg under the tongue every 5 (five) minutes as needed for chest pain. Reported on 12/12/2015    [provider]  Polyethyl Glycol-Propyl Glycol (SYSTANE) 0.4-0.3 % SOLN Apply 2 drops to eye 3 (three) times daily as needed (dry eyes).     [provider]  pravastatin (PRAVACHOL) 20 MG tablet Take 60 mg by mouth at bedtime.     [provider]  ranitidine (ZANTAC) 300 MG tablet Take 600 mg by mouth at bedtime.     [provider]  senna (SENOKOT) 8.6 MG tablet Take 2 tablets by mouth daily.     [provider]    Family History Family History  Problem Relation Age of Onset  . Hypertension Mother   . Cancer Mother   . Heart attack Unknown   . Heart attack Unknown   . Heart attack Brother   . Colon cancer Neg Hx   . Stroke Neg Hx     Social History Social History   Tobacco Use  . Smoking status: Former Smoker    Packs/day: 1.00    Years: 25.00    Pack years: 25.00    Types: Cigarettes    Last attempt to quit: 06/08/1994    Years since quitting: 23.9  . Smokeless tobacco: Never Used   Substance Use Topics  . Alcohol use: No    Alcohol/week: 0.0 standard drinks  . Drug use: No     Allergies   Actos [pioglitazone hydrochloride]; Buprenorphine hcl; Celebrex [celecoxib]; Demerol; Meperidine; Morphine and related; Ciprofloxacin; Metformin; and Zocor [simvastatin]   Review of Systems Review of Systems  Constitutional: Negative for chills and fever.  Respiratory: Positive for cough (Chronic/unchanged).   Cardiovascular: Negative.   Gastrointestinal: Positive for abdominal pain, nausea and vomiting.  Genitourinary: Negative.  Negative for dysuria and hematuria.  Musculoskeletal: Negative.   Skin: Negative.   Neurological: Negative.      Physical Exam Updated Vital Signs BP (!) 157/53 (BP Location: Left Arm)   Pulse 74   Temp 98.4 F (36.9 C) (Oral)   Resp (!) 29   SpO2 97%   Physical Exam  Constitutional: He is oriented to person, place, and time. He appears well-developed and well-nourished.  Uncomfortable appearing  HENT:  Head: Normocephalic.  Neck: Normal range of motion. Neck supple.  Cardiovascular: Normal rate and regular rhythm.  Pulmonary/Chest: Effort normal. He has rales (Diffuse).  Abdominal: Soft. Bowel sounds are normal. There is tenderness in the right upper quadrant. There is no rebound and no guarding.  Colostomy in LLQ associated with large ventral hernia that is soft and nontender. "Unchanged" per patient.  Musculoskeletal: Normal range of motion.  Neurological: He is alert and oriented to person, place, and time. No cranial nerve deficit.  Skin: Skin is warm and dry. No rash noted.  Psychiatric: He has a normal mood and affect.  Nursing note and vitals reviewed.    ED Treatments / Results  Labs (all labs ordered are listed, but only abnormal results are displayed) Labs Reviewed  LIPASE, BLOOD  COMPREHENSIVE METABOLIC PANEL  CBC  URINALYSIS, ROUTINE W REFLEX MICROSCOPIC    EKG None  Radiology No results  found.  Procedures Procedures (including  critical care time)  Medications Ordered in ED Medications  HYDROmorphone (DILAUDID) injection 0.5 mg (has no administration in time range)     Initial Impression / Assessment and Plan / ED Course  I have reviewed the triage vital signs and the nursing notes.  Pertinent labs & imaging results that were available during my care of the patient were reviewed by me and considered in my medical decision making (see chart for details).     Patient to the ED for evaluation of constant, sharp, RUQ abdominal pain, radiating to the back, that started around 9:00 pm last night. No fever.   [place] was obtained to evaluated gall bladder. Preliminary report from technologist is that there are no gall stones or wall thickening. There is problem with the PACs system causing delay in downloading for radiologist interpretation. Given this information, CT scan ordered and is pending. Pain is managed currently.   CT pending. Patient care signed out to Dr. Pryor Curia, for result review and disposition/treatment of result.   Final Clinical Impressions(s) / ED Diagnoses   Final diagnoses:  None   1. Abdominal pain  ED Discharge Orders    None       Charlann Lange, PA-C 05/08/18 0542    Charlann Lange, PA-C 05/08/18 [phone]

## 2018-05-09 ENCOUNTER — Inpatient Hospital Stay (HOSPITAL_COMMUNITY): Payer: Medicare Other

## 2018-05-09 ENCOUNTER — Encounter (HOSPITAL_COMMUNITY): Payer: Self-pay | Admitting: General Surgery

## 2018-05-09 DIAGNOSIS — J168 Pneumonia due to other specified infectious organisms: Secondary | ICD-10-CM

## 2018-05-09 DIAGNOSIS — J449 Chronic obstructive pulmonary disease, unspecified: Secondary | ICD-10-CM

## 2018-05-09 DIAGNOSIS — I10 Essential (primary) hypertension: Secondary | ICD-10-CM

## 2018-05-09 DIAGNOSIS — I5032 Chronic diastolic (congestive) heart failure: Secondary | ICD-10-CM

## 2018-05-09 DIAGNOSIS — I4891 Unspecified atrial fibrillation: Secondary | ICD-10-CM

## 2018-05-09 LAB — CBC
HCT: 26.5 % — ABNORMAL LOW (ref 39.0–52.0)
HCT: 27.5 % — ABNORMAL LOW (ref 39.0–52.0)
Hemoglobin: 8 g/dL — ABNORMAL LOW (ref 13.0–17.0)
Hemoglobin: 8.2 g/dL — ABNORMAL LOW (ref 13.0–17.0)
MCH: 31 pg (ref 26.0–34.0)
MCH: 30 pg (ref 26.0–34.0)
MCHC: 30.2 g/dL (ref 30.0–36.0)
MCHC: 29.8 g/dL — ABNORMAL LOW (ref 30.0–36.0)
MCV: 102.7 fL — AB (ref 80.0–100.0)
MCV: 100.7 fL — ABNORMAL HIGH (ref 80.0–100.0)
Platelets: 140 10*3/uL — ABNORMAL LOW (ref 150–400)
Platelets: 154 10*3/uL (ref 150–400)
RBC: 2.58 MIL/uL — ABNORMAL LOW (ref 4.22–5.81)
RBC: 2.73 MIL/uL — ABNORMAL LOW (ref 4.22–5.81)
RDW: 14.1 % (ref 11.5–15.5)
RDW: 14.1 % (ref 11.5–15.5)
WBC: 24.6 10*3/uL — ABNORMAL HIGH (ref 4.0–10.5)
WBC: 21.4 10*3/uL — ABNORMAL HIGH (ref 4.0–10.5)
nRBC: 0 % (ref 0.0–0.2)
nRBC: 0 % (ref 0.0–0.2)

## 2018-05-09 LAB — CBC WITH DIFFERENTIAL/PLATELET
Abs Immature Granulocytes: 2.79 10*3/uL — ABNORMAL HIGH (ref 0.00–0.07)
Basophils Absolute: 0 10*3/uL (ref 0.0–0.1)
Basophils Relative: 0 %
Eosinophils Absolute: 0.1 10*3/uL (ref 0.0–0.5)
Eosinophils Relative: 1 %
HCT: 26.7 % — ABNORMAL LOW (ref 39.0–52.0)
Hemoglobin: 8.3 g/dL — ABNORMAL LOW (ref 13.0–17.0)
Immature Granulocytes: 12 %
Lymphocytes Relative: 1 %
Lymphs Abs: 0.2 10*3/uL — ABNORMAL LOW (ref 0.7–4.0)
MCH: 31.3 pg (ref 26.0–34.0)
MCHC: 31.1 g/dL (ref 30.0–36.0)
MCV: 100.8 fL — ABNORMAL HIGH (ref 80.0–100.0)
Monocytes Absolute: 2.7 10*3/uL — ABNORMAL HIGH (ref 0.1–1.0)
Monocytes Relative: 12 %
Neutro Abs: 17.3 10*3/uL — ABNORMAL HIGH (ref 1.7–7.7)
Neutrophils Relative %: 74 %
Platelets: 167 10*3/uL (ref 150–400)
RBC: 2.65 MIL/uL — ABNORMAL LOW (ref 4.22–5.81)
RDW: 14 % (ref 11.5–15.5)
WBC: 23.1 10*3/uL — ABNORMAL HIGH (ref 4.0–10.5)
nRBC: 0 % (ref 0.0–0.2)

## 2018-05-09 LAB — BLOOD CULTURE ID PANEL (REFLEXED)
Acinetobacter baumannii: NOT DETECTED
Candida albicans: NOT DETECTED
Candida glabrata: NOT DETECTED
Candida krusei: NOT DETECTED
Candida parapsilosis: NOT DETECTED
Candida tropicalis: NOT DETECTED
Carbapenem resistance: NOT DETECTED
Enterobacter cloacae complex: NOT DETECTED
Enterobacteriaceae species: DETECTED — AB
Enterococcus species: NOT DETECTED
Escherichia coli: DETECTED — AB
Haemophilus influenzae: NOT DETECTED
Klebsiella oxytoca: NOT DETECTED
Klebsiella pneumoniae: NOT DETECTED
Listeria monocytogenes: NOT DETECTED
Neisseria meningitidis: NOT DETECTED
Proteus species: NOT DETECTED
Pseudomonas aeruginosa: NOT DETECTED
STREPTOCOCCUS AGALACTIAE: NOT DETECTED
STREPTOCOCCUS SPECIES: NOT DETECTED
Serratia marcescens: NOT DETECTED
Staphylococcus aureus (BCID): NOT DETECTED
Staphylococcus species: NOT DETECTED
Streptococcus pneumoniae: NOT DETECTED
Streptococcus pyogenes: NOT DETECTED

## 2018-05-09 LAB — TYPE AND SCREEN
ABO/RH(D): A POS
Antibody Screen: NEGATIVE

## 2018-05-09 LAB — COMPREHENSIVE METABOLIC PANEL WITH GFR
ALT: 15 U/L (ref 0–44)
AST: 28 U/L (ref 15–41)
Albumin: 2.8 g/dL — ABNORMAL LOW (ref 3.5–5.0)
Alkaline Phosphatase: 77 U/L (ref 38–126)
Anion gap: 14 (ref 5–15)
BUN: 25 mg/dL — ABNORMAL HIGH (ref 8–23)
CO2: 24 mmol/L (ref 22–32)
Calcium: 9 mg/dL (ref 8.9–10.3)
Chloride: 101 mmol/L (ref 98–111)
Creatinine, Ser: 1.58 mg/dL — ABNORMAL HIGH (ref 0.61–1.24)
GFR calc Af Amer: 44 mL/min — ABNORMAL LOW
GFR calc non Af Amer: 38 mL/min — ABNORMAL LOW
Glucose, Bld: 134 mg/dL — ABNORMAL HIGH (ref 70–99)
Potassium: 3.8 mmol/L (ref 3.5–5.1)
Sodium: 139 mmol/L (ref 135–145)
Total Bilirubin: 1.3 mg/dL — ABNORMAL HIGH (ref 0.3–1.2)
Total Protein: 6.5 g/dL (ref 6.5–8.1)

## 2018-05-09 LAB — EXPECTORATED SPUTUM ASSESSMENT W GRAM STAIN, RFLX TO RESP C

## 2018-05-09 LAB — IRON AND TIBC
Iron: 9 ug/dL — ABNORMAL LOW (ref 45–182)
Saturation Ratios: 5 % — ABNORMAL LOW (ref 17.9–39.5)
TIBC: 182 ug/dL — ABNORMAL LOW (ref 250–450)
UIBC: 173 ug/dL

## 2018-05-09 LAB — HEPARIN LEVEL (UNFRACTIONATED): Heparin Unfractionated: >2.2 IU/mL — ABNORMAL HIGH (ref 0.30–0.70)

## 2018-05-09 LAB — APTT: aPTT: 40 seconds — ABNORMAL HIGH (ref 24–36)

## 2018-05-09 LAB — PROTIME-INR
INR: 1.39
Prothrombin Time: 16.9 seconds — ABNORMAL HIGH (ref 11.4–15.2)

## 2018-05-09 LAB — MRSA PCR SCREENING: MRSA by PCR: NEGATIVE

## 2018-05-09 LAB — VITAMIN B12: Vitamin B-12: 1145 pg/mL — ABNORMAL HIGH (ref 180–914)

## 2018-05-09 LAB — FERRITIN: Ferritin: 294 ng/mL (ref 24–336)

## 2018-05-09 LAB — EXPECTORATED SPUTUM ASSESSMENT W REFEX TO RESP CULTURE

## 2018-05-09 MED ORDER — ORAL CARE MOUTH RINSE
15.0000 mL | Freq: Two times a day (BID) | OROMUCOSAL | Status: DC
  Administered 2018-05-09 – 2018-05-19 (×19): 15 mL via OROMUCOSAL

## 2018-05-09 MED ORDER — DILTIAZEM HCL ER COATED BEADS 180 MG PO CP24: 180.0000 mg | ORAL_CAPSULE | Freq: Every day | ORAL | Status: DC

## 2018-05-09 MED ORDER — CHLORHEXIDINE GLUCONATE 0.12 % MT SOLN
15.0000 mL | Freq: Two times a day (BID) | OROMUCOSAL | Status: DC
  Administered 2018-05-09 – 2018-05-19 (×21): 15 mL via OROMUCOSAL
  Filled 2018-05-09 (×20): qty 15

## 2018-05-09 MED ORDER — IPRATROPIUM-ALBUTEROL 0.5-2.5 (3) MG/3ML IN SOLN
3.0000 mL | Freq: Four times a day (QID) | RESPIRATORY_TRACT | Status: DC
  Administered 2018-05-09 – 2018-05-14 (×21): 3 mL via RESPIRATORY_TRACT
  Filled 2018-05-09 (×22): qty 3

## 2018-05-09 MED ORDER — GUAIFENESIN ER 600 MG PO TB12
1200.0000 mg | ORAL_TABLET | Freq: Two times a day (BID) | ORAL | Status: DC
  Administered 2018-05-10 – 2018-05-19 (×18): 1200 mg via ORAL
  Filled 2018-05-09 (×19): qty 2

## 2018-05-09 MED ORDER — FUROSEMIDE 10 MG/ML IJ SOLN
80.0000 mg | Freq: Two times a day (BID) | INTRAMUSCULAR | Status: DC
  Administered 2018-05-09 – 2018-05-11 (×4): 80 mg via INTRAVENOUS
  Filled 2018-05-09 (×4): qty 8

## 2018-05-09 MED ORDER — SODIUM CHLORIDE 0.9 % IV SOLN
2.0000 g | Freq: Two times a day (BID) | INTRAVENOUS | Status: DC
  Administered 2018-05-09 – 2018-05-10 (×3): 2 g via INTRAVENOUS
  Filled 2018-05-09 (×5): qty 2

## 2018-05-09 MED ORDER — TECHNETIUM TC 99M MEBROFENIN IV KIT
4.9000 | PACK | Freq: Once | INTRAVENOUS | Status: AC | PRN
  Administered 2018-05-09: 4.9 via INTRAVENOUS

## 2018-05-09 MED ORDER — HEPARIN (PORCINE) 25000 UT/250ML-% IV SOLN
1300.0000 [IU]/h | INTRAVENOUS | Status: AC
  Administered 2018-05-09 – 2018-05-10 (×2): 1300 [IU]/h via INTRAVENOUS
  Filled 2018-05-09 (×2): qty 250

## 2018-05-09 MED ORDER — DILTIAZEM HCL-DEXTROSE 100-5 MG/100ML-% IV SOLN (PREMIX)
5.0000 mg/h | INTRAVENOUS | Status: DC
  Filled 2018-05-09: qty 100

## 2018-05-09 MED ORDER — AMIODARONE HCL 200 MG PO TABS
200.0000 mg | ORAL_TABLET | Freq: Two times a day (BID) | ORAL | Status: DC
  Administered 2018-05-10 – 2018-05-19 (×18): 200 mg via ORAL
  Filled 2018-05-09 (×18): qty 1

## 2018-05-09 MED ORDER — METOPROLOL TARTRATE 5 MG/5ML IV SOLN
2.5000 mg | Freq: Four times a day (QID) | INTRAVENOUS | Status: DC | PRN
  Administered 2018-05-11: 2.5 mg via INTRAVENOUS
  Filled 2018-05-09: qty 5

## 2018-05-09 NOTE — Progress Notes (Signed)
CC:  RUQ pain  Subjective: Course harsh breath sounds, coughing up yellow sputum.  Still complaining of abdominal pain.  He has a large LLQ parastomal hernia with some stool in the bag.  He has a large midline scar from prior colon surgery.  Continues to complain of RUQ pain also.  More tender here.    Objective: Vital signs in last 24 hours: Temp:  [97.7 F (36.5 C)-98.2 F (36.8 C)] 98.2 F (36.8 C) (12/02 0456) Pulse Rate:  [76-90] 85 (12/02 0456) Resp:  [12-25] 12 (12/02 0456) BP: (112-166)/(40-77) 148/51 (12/02 0456) SpO2:  [88 %-98 %] 91 % (12/02 0844) Weight:  [101.7 kg-103.4 kg] 103.4 kg (12/02 0043) Last BM Date: 05/08/18 422 intake recorded Urine 250 recorded Afebrile, VSS PH 7.42 pCO2 42, pO2 61.7 HCO3 26.9 Creatinine up 1.58 WBC 23.1, H/H is down 8.3/26.7 CT abd/pelvis 12/1:Enlarged and hydropic gallbladder with hazy pericholecystic fat stranding, suspicious for acute cholecystitis. Correlation with laboratory values recommended. Additionally, further evaluation with dedicated right upper quadrant ultrasound could be performed for further evaluation as clinically warranted. 2. Dense consolidative opacities within the bilateral lung bases, right greater than left, concerning for infiltrates/pneumonia. Associated bilateral pleural effusions, complex in appearance on the left with increased internal density, raising the possibility for empyema. Please note that a malignant process could also have this appearance. Correlation with fluid analysis may be helpful for further evaluation as clinically warranted. 3. Large left lower quadrant parastomal hernia containing loops of small bowel without associated inflammation or obstruction.  [place] abd:No stones or sludge within the gallbladder lumen. Gallbladder wall measure within normal limits at 2.3 mm. No free pericholecystic fluid. A positive sonographic Murphy sign was elicited on exam. Common bile duct:  Diameter:  5 mm Multiple scattered hepatic cysts noted, largest of which is septated and measures 4.4 cm in the left hepatic lobe. These are grossly stable from prior CTs. Intake/Output from previous day: 12/01 0701 - 12/02 0700 In: 422.9 [I.V.:72.9; IV Piggyback:350] Out: 250 [Urine:250] Intake/Output this shift: Total I/O In: -  Out: 300 [Urine:300]  General appearance: alert, cooperative, mild distress and Very uncomfortable, harsh BS, ronchi, thick upper airway BS, with cough and productive sputum.   Resp: course thick upper airway BS, cough, ronchi, congested, thick sputum on O2 GI: Tender RUQ, seems to be the most tender, Also tender around large parastomal hernia, Stool in ostomy bag.    Lab Results:  Recent Labs    05/08/18 0328 05/09/18 0548  WBC 11.7* 23.1*  HGB 10.3* 8.3*  HCT 33.4* 26.7*  PLT 161 167    BMET Recent Labs    05/08/18 0328 05/09/18 0548  NA 134* 139  K 3.3* 3.8  CL 96* 101  CO2 28 24  GLUCOSE 171* 134*  BUN 21 25*  CREATININE 1.41* 1.58*  CALCIUM 9.4 9.0   PT/INR No results for input(s): LABPROT, INR in the last 72 hours.  Recent Labs  Lab 05/08/18 0328 05/09/18 0548  AST 16 28  ALT 14 15  ALKPHOS 75 77  BILITOT 1.6* 1.3*  PROT 7.5 6.5  ALBUMIN 3.6 2.8*     Lipase     Component Value Date/Time   LIPASE 20 05/08/2018 0328     Prior to Admission medications   Medication Sig Start Date End Date Taking? Authorizing Provider  amiodarone (PACERONE) 200 MG tablet Take 1 tablet (200 mg total) by mouth 2 (two) times daily. 04/20/18  Yes Kinnie Feil, MD  apixaban (  ELIQUIS) 5 MG TABS tablet Take 2.5 mg by mouth 2 (two) times daily.   Yes [provider]  aspirin EC 81 MG EC tablet Take 1 tablet (81 mg total) by mouth daily. 04/21/18  Yes Buriev, Arie Sabina, MD  budesonide (PULMICORT) 0.25 MG/2ML nebulizer solution Take 2 mLs (0.25 mg total) by nebulization 2 (two) times daily. 12/06/17  Yes Lauraine Rinne, NP  diltiazem (CARDIZEM CD)  180 MG 24 hr capsule Take 1 capsule (180 mg total) by mouth daily. 04/21/18  Yes Buriev, Arie Sabina, MD  fluticasone (FLONASE) 50 MCG/ACT nasal spray Place 2 sprays into both nostrils daily. Allergies 08/01/14  Yes [name], MD  furosemide (LASIX) 40 MG tablet Take 1 tablet (40 mg total) by mouth 2 (two) times daily. 03/31/18  Yes Belva Crome, MD  HYDROcodone-acetaminophen (NORCO) 7.5-325 MG tablet Take 1 tablet by mouth every 6 (six) hours as needed for moderate pain or severe pain.   Yes [provider]  insulin aspart (NOVOLOG) 100 UNIT/ML injection Inject 0-5 Units into the skin at bedtime. Insulin sliding scale Patient taking differently: Inject 0-9 Units into the skin 3 (three) times daily with meals. Insulin sliding scale  101-150=1 units 151-200=2 units 201-250=3 units 251-300=5 units 301-350=7 units >350=9 units Call MD for BS>400 or <60 04/20/18  Yes Buriev, Arie Sabina, MD  ipratropium-albuterol (DUONEB) 0.5-2.5 (3) MG/3ML SOLN Take 3 mLs by nebulization 3 (three) times daily.  02/17/17  Yes [provider]  isosorbide mononitrate (IMDUR) 30 MG 24 hr tablet Take 0.5 tablets (15 mg total) by mouth daily. 04/21/18  Yes Buriev, Arie Sabina, MD  pravastatin (PRAVACHOL) 20 MG tablet Take 20 mg by mouth daily.    Yes [provider]  ranitidine (ZANTAC) 300 MG tablet Take 600 mg by mouth at bedtime.    Yes [provider]  senna (SENOKOT) 8.6 MG tablet Take 2 tablets by mouth daily.    Yes [provider]  acetaminophen (TYLENOL) 500 MG tablet Take 1,000 mg by mouth every 6 (six) hours as needed for mild pain or fever.    [provider]  apixaban (ELIQUIS) 2.5 MG TABS tablet Take 1 tablet (2.5 mg total) by mouth 2 (two) times daily. Patient not taking: Reported on 05/08/2018 04/20/18   Kinnie Feil, MD  glucose blood (ONE TOUCH ULTRA TEST) test strip USE TO CHECK BLOOD SUGARS ONCE A DAY 10/28/17   [name], MD   guaiFENesin-dextromethorphan (ROBITUSSIN DM) 100-10 MG/5ML syrup Take 5 mLs by mouth every 4 (four) hours as needed for cough. 10/15/16   Lavina Hamman, MD    Medications: . budesonide  0.25 mg Nebulization BID  . chlorhexidine  15 mL Mouth Rinse BID  . furosemide  40 mg Intravenous Q12H  . heparin  5,000 Units Subcutaneous Q8H  . ipratropium-albuterol  3 mL Nebulization Q6H  . mouth rinse  15 mL Mouth Rinse q12n4p  . pantoprazole (PROTONIX) IV  40 mg Intravenous Q24H   . sodium chloride 10 mL/hr at 05/09/18 0448  . ceFEPime (MAXIPIME) IV 2 g (05/09/18 0626)  . metronidazole 500 mg (05/09/18 0627)  . [START ON 05/10/2018] vancomycin     Anti-infectives (From admission, onward)   Start     Dose/Rate Route Frequency Ordered Stop   05/10/18 0600  vancomycin (VANCOCIN) 1,500 mg in sodium chloride 0.9 % 500 mL IVPB     1,500 mg 250 mL/hr over 120 Minutes Intravenous Every 48 hours 05/08/18  1240     05/09/18 0630  ceFEPIme (MAXIPIME) 2 g in sodium chloride 0.9 % 100 mL IVPB     2 g 200 mL/hr over 30 Minutes Intravenous Every 12 hours 05/09/18 0209     05/08/18 2100  vancomycin (VANCOCIN) IVPB 1000 mg/200 mL premix  Status:  Discontinued     1,000 mg 200 mL/hr over 60 Minutes Intravenous  Once 05/08/18 2047 05/08/18 2057   05/08/18 1030  vancomycin (VANCOCIN) IVPB 1000 mg/200 mL premix     1,000 mg 200 mL/hr over 60 Minutes Intravenous  Once 05/08/18 0954 05/08/18 1151   05/08/18 0800  metroNIDAZOLE (FLAGYL) IVPB 500 mg     500 mg 100 mL/hr over 60 Minutes Intravenous Every 8 hours 05/08/18 0637     05/08/18 0700  ceFEPIme (MAXIPIME) 1 g in sodium chloride 0.9 % 100 mL IVPB  Status:  Discontinued     1 g 200 mL/hr over 30 Minutes Intravenous Every 8 hours 05/08/18 0637 05/09/18 0209   05/08/18 0615  vancomycin (VANCOCIN) IVPB 1000 mg/200 mL premix     1,000 mg 200 mL/hr over 60 Minutes Intravenous  Once 05/08/18 0603 05/08/18 0749   05/08/18 0615  piperacillin-tazobactam (ZOSYN)  IVPB 3.375 g  Status:  Discontinued     3.375 g 100 mL/hr over 30 Minutes Intravenous  Once 05/08/18 0603 05/08/18 6067      Assessment/Plan COPD AF/on Eliquis - no dose in hospital Hx of CDHF/Hx CABG Type II diabetes Obesity BMI 36.8. Hx OSA Hypertension Hx colon resection/colostomy 11/2014, [facility]BO 2009  RUQ pain Bilateral pneumonia with effusions  FEN:  IV fluids/NPO ID:  Maxipime 12/1 >> day 2  Flagyl 12/1 >> day 2 Vancomycin 12/1 - 2 doses infused DVT: Heparin Follow up: TBD   Plan:  I would like to get a HIDA scan, but I'm not sure he can handle lying down flat that long with current breathing.  I have talked to Dr.Netty and he will look at him also and we will decide on next step.       LOS: 1 day    Merlin Ege 05/09/2018 [phone]

## 2018-05-09 NOTE — Progress Notes (Signed)
PROGRESS NOTE    Eric [name]  AJO:[id number] DOB: [birth year] DOA: 05/08/2018 PCP: [name], MD   Brief Narrative: Eric [name] is a 85 y.o. male with medical history significant for coronary artery disease and aortic sclerosis hypertension hyperlipidemia, diabetes, COPD, O2 dependent on 3 L at home, chronic respiratory failure on oxygen, ischemic cardiomyopathy, atrial fibrillation on Eliquis. Patient presented with right upper quadrant pain concerning for acute cholecystitis, but also found to have evidence of bilateral pneumonia and positive blood cultures for E. Coli.   Assessment & Plan:   Active Problems:   Type 2 diabetes mellitus, controlled ([place])   Hypertensive heart disease with CHF ([place])   COPD mixed type (HCC)   Obstructive sleep apnea   Chronic diastolic heart failure (HCC)   Obesity (BMI 30-39.9)   Hartmann's pouch of intestine (HCC)   Essential hypertension   Atrial fibrillation with RVR (HCC)   Acute cholecystitis   Pneumonia   Pneumonia due to other specified infectious organisms   RUQ pain Acute. Likely cholecystitis from CT scan report. No elevated AST/ALT/Alk phos. Pain significant. No nausea/vomiting. -Continue Cefepime/Flagyl -General surgery recommendations: HIDA scan  Bilateral pneumonia Seen on CT abdomen/pelvis. Associated respiratory failure -Continue Vancomycin and cefepime -MRSA pcr; if negative, discontinue Vancomycin -Blood cultures significant for E. Coli bacteremia as mentioned below  E. Coli bacteremia Likely abdominal source. -Continue antibiotics as mentioned above  COPD exacerbation Acute on chronic respiratory failure Complicating overall picture. One 3L oxygen at home. Currently up to 5L. -Chest x-ray  Atrial fibrillation Unspecified. Rate controlled. -Hold Eliquis in anticipation of possible procedure -Heparin drip -Continue home diltiazem and amiodarone  Anemia Chronic. Unknown etiology. Some mild  macrocytosis. -Iron, TIBC, ferritin, B12, folate  Essential hypertension Mostly normal -Restart home diltiazem for atrial fibrillation -Hold Imdur  Diabetes mellitus, type 2 -Continue SSI  Obesity Body mass index is 36.8 kg/m.    DVT prophylaxis: Heparin drip Code Status:   Code Status: DNR Family Communication: Friend at bedside Disposition Plan: Discharge pending surgery management and treatment of multiple infections   Consultants:   General surgery  Procedures:   None  Antimicrobials:  Vancomycin  Cefepime    Subjective: Significant abdominal pain.  Objective: Vitals:   05/09/18 0456 05/09/18 0758 05/09/18 0837 05/09/18 0844  BP: (!) 148/51     Pulse: 85     Resp: 12     Temp: 98.2 F (36.8 C)     TempSrc: Oral     SpO2: 98%  91% 91%  Weight:      Height:  '5\' 6"'  (1.676 m)      Intake/Output Summary (Last 24 hours) at 05/09/2018 1319 Last data filed at 05/09/2018 0745 Gross per 24 hour  Intake 422.85 ml  Output 550 ml  Net -127.15 ml   Filed Weights   05/08/18 2109 05/09/18 0043  Weight: 101.7 kg 103.4 kg    Examination:  General exam: Appears calm and comfortable Respiratory system: Diffuse rhonchi. Mildly increased respiratory effort. Cardiovascular system: S1 & S2 heard, RRR. No murmurs, rubs, gallops or clicks. Gastrointestinal system: Abdomen is nondistended, soft and tender in lower quadrants. Normal bowel sounds heard. Central nervous system: Alert and oriented. No focal neurological deficits. Extremities: No calf tenderness Skin: No cyanosis. No rashes Psychiatry: Judgement and insight appear normal. Mood & affect appropriate.     Data Reviewed: I have personally reviewed following labs and imaging studies  CBC: Recent Labs  Lab 05/08/18 0328 05/09/18 0548 05/09/18 1152  WBC 11.7* 23.1* 24.6*  NEUTROABS  --  17.3*  --   HGB 10.3* 8.3* 8.0*  HCT 33.4* 26.7* 26.5*  MCV 97.9 100.8* 102.7*  PLT 161 167 140*   Basic  Metabolic Panel: Recent Labs  Lab 05/08/18 0328 05/09/18 0548  NA 134* 139  K 3.3* 3.8  CL 96* 101  CO2 28 24  GLUCOSE 171* 134*  BUN 21 25*  CREATININE 1.41* 1.58*  CALCIUM 9.4 9.0  MG 2.0  --   PHOS 2.4*  --    GFR: Estimated Creatinine Clearance: 35.7 mL/min (A) (by C-G formula based on SCr of 1.58 mg/dL (H)). Liver Function Tests: Recent Labs  Lab 05/08/18 0328 05/09/18 0548  AST 16 28  ALT 14 15  ALKPHOS 75 77  BILITOT 1.6* 1.3*  PROT 7.5 6.5  ALBUMIN 3.6 2.8*   Recent Labs  Lab 05/08/18 0328  LIPASE 20   No results for input(s): AMMONIA in the last 168 hours. Coagulation Profile: Recent Labs  Lab 05/09/18 1152  INR 1.39   Cardiac Enzymes: No results for input(s): CKTOTAL, CKMB, CKMBINDEX, TROPONINI in the last 168 hours. BNP (last 3 results) Recent Labs    10/19/17 1448  PROBNP 473   HbA1C: No results for input(s): HGBA1C in the last 72 hours. CBG: No results for input(s): GLUCAP in the last 168 hours. Lipid Profile: No results for input(s): CHOL, HDL, LDLCALC, TRIG, CHOLHDL, LDLDIRECT in the last 72 hours. Thyroid Function Tests: No results for input(s): TSH, T4TOTAL, FREET4, T3FREE, THYROIDAB in the last 72 hours. Anemia Panel: No results for input(s): VITAMINB12, FOLATE, FERRITIN, TIBC, IRON, RETICCTPCT in the last 72 hours. Sepsis Labs: No results for input(s): PROCALCITON, LATICACIDVEN in the last 168 hours.  Recent Results (from the past 240 hour(s))  Blood culture (routine x 2)     Status: None (Preliminary result)   Collection Time: 05/08/18  6:09 AM  Result Value Ref Range Status   Specimen Description   Final    BLOOD RIGHT ARM Performed at [facility] [address] [place], [place] [address]    Special Requests   Final    BOTTLES DRAWN AEROBIC AND ANAEROBIC Blood Culture adequate volume Performed at [facility] [address] [place], [place] [address]    Culture  Setup Time    Final    GRAM NEGATIVE RODS IN BOTH AEROBIC AND ANAEROBIC BOTTLES CRITICAL VALUE NOTED.  VALUE IS CONSISTENT WITH PREVIOUSLY REPORTED AND CALLED VALUE. Performed at [facility], [place] [address], [place], [place] [address]    Culture GRAM NEGATIVE RODS  Final   Report Status PENDING  Incomplete  Blood culture (routine x 2)     Status: Abnormal (Preliminary result)   Collection Time: 05/08/18  6:16 AM  Result Value Ref Range Status   Specimen Description   Final    BLOOD LEFT HAND Performed at [facility] [address] [place], [place] [address]    Special Requests   Final    BOTTLES DRAWN AEROBIC AND ANAEROBIC Blood Culture adequate volume Performed at [facility] [address], [place], [place] [address]    Culture  Setup Time   Final    GRAM NEGATIVE RODS IN BOTH AEROBIC AND ANAEROBIC BOTTLES CRITICAL RESULT CALLED TO, READ BACK BY AND VERIFIED WITH: B.GREEN,PHARMD AT 0026 ON 05/09/18 BY G.MCADOO Performed at [facility], [place] [address] [place], [place] [address]  Culture ESCHERICHIA COLI (A)  Final   Report Status PENDING  Incomplete  Blood Culture ID Panel (Reflexed)     Status: Abnormal   Collection Time: 05/08/18  6:16 AM  Result Value Ref Range Status   Enterococcus species NOT DETECTED NOT DETECTED Final   Listeria monocytogenes NOT DETECTED NOT DETECTED Final   Staphylococcus species NOT DETECTED NOT DETECTED Final   Staphylococcus aureus (BCID) NOT DETECTED NOT DETECTED Final   Streptococcus species NOT DETECTED NOT DETECTED Final   Streptococcus agalactiae NOT DETECTED NOT DETECTED Final   Streptococcus pneumoniae NOT DETECTED NOT DETECTED Final   Streptococcus pyogenes NOT DETECTED NOT DETECTED Final   Acinetobacter baumannii NOT DETECTED NOT DETECTED Final   Enterobacteriaceae species DETECTED (A) NOT DETECTED Final    Comment: Enterobacteriaceae represent a large family of gram-negative bacteria, not a  single organism. CRITICAL RESULT CALLED TO, READ BACK BY AND VERIFIED WITH: B.GREEN,PHARMD AT 0026 ON 05/09/18 BY G.MCADOO    Enterobacter cloacae complex NOT DETECTED NOT DETECTED Final   Escherichia coli DETECTED (A) NOT DETECTED Final    Comment: CRITICAL RESULT CALLED TO, READ BACK BY AND VERIFIED WITH: B.GREEN,PHARMD AT 0026 ON 05/09/18 BY G.MCADOO    Klebsiella oxytoca NOT DETECTED NOT DETECTED Final   Klebsiella pneumoniae NOT DETECTED NOT DETECTED Final   Proteus species NOT DETECTED NOT DETECTED Final   Serratia marcescens NOT DETECTED NOT DETECTED Final   Carbapenem resistance NOT DETECTED NOT DETECTED Final   Haemophilus influenzae NOT DETECTED NOT DETECTED Final   Neisseria meningitidis NOT DETECTED NOT DETECTED Final   Pseudomonas aeruginosa NOT DETECTED NOT DETECTED Final   Candida albicans NOT DETECTED NOT DETECTED Final   Candida glabrata NOT DETECTED NOT DETECTED Final   Candida krusei NOT DETECTED NOT DETECTED Final   Candida parapsilosis NOT DETECTED NOT DETECTED Final   Candida tropicalis NOT DETECTED NOT DETECTED Final    Comment: Performed at [facility], [place] [address], [place], [place] [address]         Radiology Studies: Ct Abdomen Pelvis W Contrast  Result Date: 05/08/2018 CLINICAL DATA:  Initial evaluation for acute right upper quadrant pain, cough and congestion. EXAM: CT ABDOMEN AND PELVIS WITH CONTRAST TECHNIQUE: Multidetector CT imaging of the abdomen and pelvis was performed using the standard protocol following bolus administration of intravenous contrast. CONTRAST:  155m ISOVUE-300 IOPAMIDOL (ISOVUE-300) INJECTION 61% COMPARISON:  Prior CT from 12/07/2014. FINDINGS: Lower chest: Dense consolidative opacities present within the bilateral lung bases, right slightly worse than left. Superimposed peripheral pleural based calcifications noted. Associated bilateral pleural effusions, complex on the left with loculated appearance. Ill-defined density  within the left effusion noted (series 2, image 10). Mild cardiomegaly. Cardiac pacemaker electrodes partially visualized. Hepatobiliary: Multiple scattered cysts seen within the liver, largest of which position within the left hepatic lobe in measures 4.4 cm, similar to previous. Liver demonstrates no acute finding. Gallbladder is enlarged and hydropic in appearance with hazy pericholecystic fat stranding, suspicious for possible acute cholecystitis. No radiopaque calculi identified. No biliary dilatation. Pancreas: Diffuse fatty infiltration of the pancreas noted. Pancreas otherwise unremarkable without acute peripancreatic inflammation. Spleen: Spleen within normal limits. Adrenals/Urinary Tract: Adrenal glands are normal. Kidneys equal in size with symmetric enhancement. 3.4 cm simple left renal cyst noted. Additional scattered subcentimeter hypodensities too small the characterize, but statistically likely reflects small cysts as well. No nephrolithiasis or hydronephrosis. No focal enhancing renal mass. No hydroureter. Partially distended bladder within normal limits. Stomach/Bowel: Stomach within normal limits. No  evidence for bowel obstruction. Patient is status post sigmoid colectomy with left lower quadrant colostomy in place. Large parastomal hernia containing fat and loops of small bowel present within the left lower quadrant without associated inflammation or obstruction. Neck of the hernia measures approximately 4.1 cm in diameter. Additional ventral hernia seen just to the right of midline at the paraumbilical region contains fat and a portion of the transverse colon (series 2, image 48). No associated complication. Vascular/Lymphatic: Advanced aorto bi-iliac atherosclerotic disease. No aneurysm. Mesenteric vessels are patent proximally. No adenopathy. Reproductive: Brachytherapy seeds overlie the prostate. Other: No free air or fluid. Musculoskeletal: Right total hip arthroplasty in place. No acute  osseus abnormality. No discrete lytic or blastic osseous lesions. IMPRESSION: 1. Enlarged and hydropic gallbladder with hazy pericholecystic fat stranding, suspicious for acute cholecystitis. Correlation with laboratory values recommended. Additionally, further evaluation with dedicated right upper quadrant ultrasound could be performed for further evaluation as clinically warranted. 2. Dense consolidative opacities within the bilateral lung bases, right greater than left, concerning for infiltrates/pneumonia. Associated bilateral pleural effusions, complex in appearance on the left with increased internal density, raising the possibility for empyema. Please note that a malignant process could also have this appearance. Correlation with fluid analysis may be helpful for further evaluation as clinically warranted. 3. Large left lower quadrant parastomal hernia containing loops of small bowel without associated inflammation or obstruction. 4. Advanced aorto bi-iliac atherosclerotic disease. No aneurysm. Electronically Signed   By: Jeannine Boga M.D.   On: 05/08/2018 05:42   US Abdomen Limited  Result Date: 05/08/2018 CLINICAL DATA:  Initial evaluation for acute right upper quadrant pain. EXAM: ULTRASOUND ABDOMEN LIMITED RIGHT UPPER QUADRANT COMPARISON:  CT from same day. Please note that CT was performed following this examination, although this exam only became available for review after the CT was completed. FINDINGS: Gallbladder: No stones or sludge within the gallbladder lumen. Gallbladder wall measure within normal limits at 2.3 mm. No free pericholecystic fluid. A positive sonographic Murphy sign was elicited on exam. Common bile duct: Diameter: 5 mm Liver: Multiple scattered hepatic cysts noted, largest of which is septated and measures 4.4 cm in the left hepatic lobe. These are grossly stable from prior CTs. Within normal limits in parenchymal echogenicity. Portal vein is patent on color Doppler  imaging with normal direction of blood flow towards the liver. IMPRESSION: 1. Positive Murphy sign without additional sonographic features for acute cholecystitis. No cholelithiasis. 2. No biliary dilatation. 3. Multiple scattered hepatic cysts, better evaluated on previous CTs. Electronically Signed   By: Jeannine Boga M.D.   On: 05/08/2018 06:42   Dg Chest Port 1 View  Result Date: 05/09/2018 CLINICAL DATA:  Respiratory failure EXAM: PORTABLE CHEST 1 VIEW COMPARISON:  04/12/2018 FINDINGS: Cardiac shadow is stable. Postsurgical changes are again seen. Pacing device is again noted and stable. Increased vascular congestion with interstitial edema is noted. Chronic blunting of left costophrenic angle is noted. Left basilar scarring is again noted. IMPRESSION: Changes of congestive heart failure with edema. Chronic changes in the left base appear Electronically Signed   By: Inez Catalina M.D.   On: 05/09/2018 12:11        Scheduled Meds: . budesonide  0.25 mg Nebulization BID  . chlorhexidine  15 mL Mouth Rinse BID  . furosemide  40 mg Intravenous Q12H  . guaiFENesin  1,200 mg Oral BID  . ipratropium-albuterol  3 mL Nebulization Q6H  . mouth rinse  15 mL Mouth Rinse q12n4p  . pantoprazole (  PROTONIX) IV  40 mg Intravenous Q24H   Continuous Infusions: . sodium chloride 10 mL/hr at 05/09/18 0448  . ceFEPime (MAXIPIME) IV 2 g (05/09/18 0626)  . metronidazole 500 mg (05/09/18 0627)  . [START ON 05/10/2018] vancomycin       LOS: 1 day     Cordelia Poche, MD [facility] 05/09/2018, 1:19 PM  If 7PM-7AM, please contact night-coverage [email or web address]

## 2018-05-09 NOTE — Progress Notes (Signed)
ANTICOAGULATION CONSULT NOTE - Initial Consult  Pharmacy Consult for a.fib Indication: atrial fibrillation  Allergies  Allergen Reactions  . Actos [Pioglitazone Hydrochloride] Other (See Comments)    "felt funny, drowsy, and weak":  [name] Buprenorphine Hcl Nausea And Vomiting  . Celebrex [Celecoxib] Other (See Comments)    "felt funny"  . Demerol Palpitations and Other (See Comments)    Increased BP  . Meperidine Palpitations    Other reaction(s): Other (See Comments) Increased BP  . Morphine And Related Nausea And Vomiting  . Ciprofloxacin Other (See Comments)    arthralgia  . Metformin Nausea And Vomiting  . Zocor [Simvastatin] Other (See Comments)    Makes pt very drowsy    Patient Measurements: Height: _0  (167.6 cm)(3 weeks ago per previous charting) Weight: 228 lb (103.4 kg) IBW/kg (Calculated) : 63.8 Heparin Dosing Weight: 86.8 kg   Vital Signs: Temp: 98.2 F (36.8 C) (12/02 0456) Temp Source: Oral (12/02 0456) BP: 148/51 (12/02 0456) Pulse Rate: 85 (12/02 0456)  Labs: Recent Labs    05/08/18 0328 05/09/18 0548 05/09/18 1152  HGB 10.3* 8.3* 8.0*  HCT 33.4* 26.7* 26.5*  PLT 161 167 140*  APTT  --   --  40*  LABPROT  --   --  16.9*  INR  --   --  1.39  HEPARINUNFRC  --   --  >2.20*  CREATININE 1.41* 1.58*  --     Estimated Creatinine Clearance: 35.7 mL/min (A) (by C-G formula based on SCr of 1.58 mg/dL (H)).   Medical History: Past Medical History:  Diagnosis Date  . ALLERGIC RHINITIS   . ANEMIA-NOS   . AORTIC SCLEROSIS   . Asthma   . CARDIOMYOPATHY, ISCHEMIC   . CAROTID BRUIT, RIGHT 02/27/2008  . Cataract    surgery  . CML (chronic myeloid leukemia) ([place]) 06/26/2015  . COPD   . CORONARY ARTERY DISEASE    a. s/p CABG in 1995 b. DES in 2008, 2009, and most recent in 2012 with DES to SVG-OM  . DIABETES MELLITUS-TYPE II    diet controlled  . Diastolic dysfunction, Grade 1 11/24/2014  . Diverticulitis of colon with perforation 11/22/2014  .  Diverticulosis   . GERD   . HIATAL HERNIA   . Hx of echocardiogram    Echo (9/15):  Mild LVH, EF 50-55%, no RWMA, Gr 1 DD, MAC, mild LAE.  [name] HYPERLIPIDEMIA   . HYPERTENSION   . Hyponatremia 11/22/2014  . IBS (irritable bowel syndrome)   . LACTOSE INTOLERANCE   . OA (osteoarthritis)   . OBESITY   . On home oxygen therapy    "3L all the time" (10/12/2016)  . Partial small bowel obstruction ([place])   . PERIPHERAL VASCULAR DISEASE   . Primary hyperparathyroidism ([place])    Lab Results Component Value Date  PTH 150.7* 02/13/2013  CALCIUM 11.0* 02/13/2013  CAION 1.21 03/15/2008    . Prostate cancer ([place])    seed implants 2004  . SICK SINUS/ TACHY-BRADY SYNDROME 09/2007   s/p PPM st judes  . Sleep apnea   . SMALL BOWEL OBSTRUCTION 04/18/2009   Qualifier: History of  By: Asa Lente MD, Jannifer Rodney Symptomatic diverticulosis 01/18/2009   Qualifier: Diagnosis of  By: Trellis Paganini PA-c, Amy S     Medications:  Scheduled:  . budesonide  0.25 mg Nebulization BID  . chlorhexidine  15 mL Mouth Rinse BID  . furosemide  40 mg Intravenous Q12H  . guaiFENesin  1,200 mg Oral  BID  . ipratropium-albuterol  3 mL Nebulization Q6H  . mouth rinse  15 mL Mouth Rinse q12n4p  . pantoprazole (PROTONIX) IV  40 mg Intravenous Q24H    Assessment: Pharmacy is consulted to dose heparin in 84 yo male with PMH of a.fib.  Pt had apixaban listed in nursing home med list, with last dose being 11/30 per med rec. Pt was originally placed on heparin 5000 units SQ q8h for DVT prophylaxis with last dose of SQ heparin being 0518 on 12/2.   Baseline labs, Today,  05/09/18:   HL > 2.20  Pt 16.9, INR 1.39  APTT 40  With baseline HL elevated, will dose aPTT until HL and aPTT start to correlate  Hgb 8.0 low but consistent with previous results  Plt 140  Scr 1.58 mg/dl, CrCl is 36 ml/min     Goal of Therapy:  APTT target is 66-103 seconds Monitor platelets by anticoagulation protocol: Yes   Plan:   Heparin 1300  units/hr with no bolus  APTT 8 hours after start of infusion  HL with AM labs to see correlation between aPTT and HL    CBC ordered q6h x 5 through 12/3  Monitor for signs and symptoms of bleeding   Royetta Asal, PharmD, BCPS Pager [phone] 05/09/2018 1:27 PM

## 2018-05-09 NOTE — Progress Notes (Signed)
   05/09/18 1548  Clinical Encounter Type  Visited With Patient not available  Visit Type Initial  Referral From Nurse  Consult/Referral To [facility]  The chaplain responded to Pt. spiritual care request for prayer.  At the time of the visit the Pt. was out of the room for a procedure.  The chaplain inquired about the Pt. return.  The Pt. is expected to return in 30-40min. The Chaplain will follow up with the Pt. spiritual care request.

## 2018-05-09 NOTE — Consult Note (Addendum)
[facility] Nurse ostomy consult note Pt had ostomy surgery several years ago and is familiar to [facility] team from multiple previous admissions.  He uses a Chiropractor 2 piece, 2 3/4 inch pouching system and does not use a barrier ring or convexity.  He states he obtains several days of wear time and has a significant peristomal hernia surrounding the location. Stoma type/location: Stoma to left abd is red and viable, 1 1/2 inches, flush with skin level Peristomal assessment:  Intact skin surrounding  Output: Mod amt semiformed brown stool  Ostomy pouching: 2pc.  Education provided: Pt states he used to be independent with pouch application and emptying but now requires assistance since he has trouble visualizing the site with the hernia.  2 sets of supplies left at the bedside and bedside nurses can assist with pouching activities while in the hospital. Enrolled patient in [place] program: Pt was already ordering his supplies in a program prior to admission. Please re-consult if further assistance is needed.  Thank-you,  Julien Girt MSN, [place], [place], [place], [place]

## 2018-05-09 NOTE — Evaluation (Signed)
SLP Cancellation Note  Patient Details Name: Eric [name] MRN: [id number] DOB: Oct 17, 1926   Cancelled treatment:       Reason Eval/Treat Not Completed: Other (comment)(pt at her HIDA scan at this time, will follow up next date early am)   Macario Golds 05/09/2018, 2:07 PM  Luanna Salk, [place] [facility] SLP [place] Pager [phone] Office [phone]

## 2018-05-09 NOTE — Progress Notes (Signed)
PHARMACY - PHYSICIAN COMMUNICATION CRITICAL VALUE ALERT - BLOOD CULTURE IDENTIFICATION (BCID)  Eric [name] is an 84 y.o. male who presented to [facility] on 05/08/2018 with a chief complaint of right upper quadrant abdominal pain.  Assessment:  Bilateral PNA (include suspected source if known) 4 of 4 bottle - e-coli WBCs=11.7 Afebrile   Name of physician (or Provider) Contacted: Alger Memos, NP   Current antibiotics: Cefepime/Flagyl and Vancomycin  Changes to prescribed antibiotics recommended:  Response not received from provider;  current antibiotics are likely to cover the isolated organism.  Consider de-escalation soon. Will increase dose to 2 Gm IV q12h for bacteremia  Results for orders placed or performed during the hospital encounter of 05/08/18  Blood Culture ID Panel (Reflexed) (Collected: 05/08/2018  6:16 AM)  Result Value Ref Range   Enterococcus species NOT DETECTED NOT DETECTED   Listeria monocytogenes NOT DETECTED NOT DETECTED   Staphylococcus species NOT DETECTED NOT DETECTED   Staphylococcus aureus (BCID) NOT DETECTED NOT DETECTED   Streptococcus species NOT DETECTED NOT DETECTED   Streptococcus agalactiae NOT DETECTED NOT DETECTED   Streptococcus pneumoniae NOT DETECTED NOT DETECTED   Streptococcus pyogenes NOT DETECTED NOT DETECTED   Acinetobacter baumannii NOT DETECTED NOT DETECTED   Enterobacteriaceae species DETECTED (A) NOT DETECTED   Enterobacter cloacae complex NOT DETECTED NOT DETECTED   Escherichia coli DETECTED (A) NOT DETECTED   Klebsiella oxytoca NOT DETECTED NOT DETECTED   Klebsiella pneumoniae NOT DETECTED NOT DETECTED   Proteus species NOT DETECTED NOT DETECTED   Serratia marcescens NOT DETECTED NOT DETECTED   Carbapenem resistance NOT DETECTED NOT DETECTED   Haemophilus influenzae NOT DETECTED NOT DETECTED   Neisseria meningitidis NOT DETECTED NOT DETECTED   Pseudomonas aeruginosa NOT DETECTED NOT DETECTED   Candida albicans NOT  DETECTED NOT DETECTED   Candida glabrata NOT DETECTED NOT DETECTED   Candida krusei NOT DETECTED NOT DETECTED   Candida parapsilosis NOT DETECTED NOT DETECTED   Candida tropicalis NOT DETECTED NOT DETECTED    Dorrene German 05/09/2018  12:30 AM

## 2018-05-09 NOTE — Progress Notes (Signed)
   05/09/18 1630  Clinical Encounter Type  Visited With Patient not available  Visit Type Follow-up  Referral From Nurse  Consult/Referral To Chaplain  Spiritual Encounters  Spiritual Needs Prayer  The chaplain followed on Pt. request for prayer.  The Pt. was out of the room when chaplain returned for spiritual care visit.  The chaplain will refer the consult to chaplain team for follow up.

## 2018-05-10 ENCOUNTER — Inpatient Hospital Stay (HOSPITAL_COMMUNITY): Payer: Medicare Other

## 2018-05-10 ENCOUNTER — Encounter (HOSPITAL_COMMUNITY): Payer: Self-pay | Admitting: Interventional Radiology

## 2018-05-10 HISTORY — PX: IR PERC CHOLECYSTOSTOMY: IMG2326

## 2018-05-10 LAB — CULTURE, BLOOD (ROUTINE X 2)
Special Requests: ADEQUATE
Special Requests: ADEQUATE

## 2018-05-10 LAB — CBC
HCT: 25.6 % — ABNORMAL LOW (ref 39.0–52.0)
HCT: 26.9 % — ABNORMAL LOW (ref 39.0–52.0)
Hemoglobin: 7.8 g/dL — ABNORMAL LOW (ref 13.0–17.0)
Hemoglobin: 7.8 g/dL — ABNORMAL LOW (ref 13.0–17.0)
MCH: 30.8 pg (ref 26.0–34.0)
MCH: 31.2 pg (ref 26.0–34.0)
MCHC: 29 g/dL — ABNORMAL LOW (ref 30.0–36.0)
MCHC: 30.5 g/dL (ref 30.0–36.0)
MCV: 102.4 fL — ABNORMAL HIGH (ref 80.0–100.0)
MCV: 106.3 fL — ABNORMAL HIGH (ref 80.0–100.0)
Platelets: 126 10*3/uL — ABNORMAL LOW (ref 150–400)
Platelets: 137 10*3/uL — ABNORMAL LOW (ref 150–400)
RBC: 2.5 MIL/uL — ABNORMAL LOW (ref 4.22–5.81)
RBC: 2.53 MIL/uL — AB (ref 4.22–5.81)
RDW: 14.2 % (ref 11.5–15.5)
RDW: 14.4 % (ref 11.5–15.5)
WBC: 18.9 10*3/uL — ABNORMAL HIGH (ref 4.0–10.5)
WBC: 20.8 10*3/uL — ABNORMAL HIGH (ref 4.0–10.5)
nRBC: 0.1 % (ref 0.0–0.2)
nRBC: 0.1 % (ref 0.0–0.2)

## 2018-05-10 LAB — COMPREHENSIVE METABOLIC PANEL
ALT: 15 U/L (ref 0–44)
AST: 26 U/L (ref 15–41)
Albumin: 2.7 g/dL — ABNORMAL LOW (ref 3.5–5.0)
Alkaline Phosphatase: 80 U/L (ref 38–126)
Anion gap: 14 (ref 5–15)
BUN: 35 mg/dL — ABNORMAL HIGH (ref 8–23)
CO2: 23 mmol/L (ref 22–32)
Calcium: 9 mg/dL (ref 8.9–10.3)
Chloride: 105 mmol/L (ref 98–111)
Creatinine, Ser: 1.67 mg/dL — ABNORMAL HIGH (ref 0.61–1.24)
GFR calc non Af Amer: 36 mL/min — ABNORMAL LOW (ref >60)
GFR, EST AFRICAN AMERICAN: 41 mL/min — AB (ref >60)
Glucose, Bld: 135 mg/dL — ABNORMAL HIGH (ref 70–99)
Potassium: 3.5 mmol/L (ref 3.5–5.1)
Sodium: 142 mmol/L (ref 135–145)
TOTAL PROTEIN: 6.1 g/dL — AB (ref 6.5–8.1)
Total Bilirubin: 1 mg/dL (ref 0.3–1.2)

## 2018-05-10 LAB — APTT
aPTT: 70 seconds — ABNORMAL HIGH (ref 24–36)
aPTT: 74 seconds — ABNORMAL HIGH (ref 24–36)

## 2018-05-10 LAB — FOLATE RBC
Folate, Hemolysate: 460.3 ng/mL
Folate, RBC: 1967 ng/mL (ref >498)
Hematocrit: 23.4 % — ABNORMAL LOW (ref 37.5–51.0)

## 2018-05-10 MED ORDER — MIDAZOLAM HCL 2 MG/2ML IJ SOLN
INTRAMUSCULAR | Status: AC
  Filled 2018-05-10: qty 2

## 2018-05-10 MED ORDER — FLUMAZENIL 0.5 MG/5ML IV SOLN
INTRAVENOUS | Status: AC
  Filled 2018-05-10: qty 5

## 2018-05-10 MED ORDER — HEPARIN (PORCINE) 25000 UT/250ML-% IV SOLN
1500.0000 [IU]/h | INTRAVENOUS | Status: DC
  Administered 2018-05-11 – 2018-05-12 (×2): 1400 [IU]/h via INTRAVENOUS
  Filled 2018-05-10 (×3): qty 250

## 2018-05-10 MED ORDER — MIDAZOLAM HCL 2 MG/2ML IJ SOLN
INTRAMUSCULAR | Status: AC | PRN
  Administered 2018-05-10 (×2): .25 mg via INTRAVENOUS

## 2018-05-10 MED ORDER — SODIUM CHLORIDE 0.9 % IV SOLN
2.0000 g | INTRAVENOUS | Status: DC
  Administered 2018-05-10 – 2018-05-19 (×10): 2 g via INTRAVENOUS
  Filled 2018-05-10 (×3): qty 2
  Filled 2018-05-10: qty 20
  Filled 2018-05-10: qty 2
  Filled 2018-05-10: qty 20
  Filled 2018-05-10 (×6): qty 2

## 2018-05-10 MED ORDER — IOPAMIDOL (ISOVUE-300) INJECTION 61%
50.0000 mL | Freq: Once | INTRAVENOUS | Status: AC | PRN
  Administered 2018-05-10: 13 mL

## 2018-05-10 MED ORDER — IOPAMIDOL (ISOVUE-300) INJECTION 61%
INTRAVENOUS | Status: AC
  Administered 2018-05-10: 13 mL
  Filled 2018-05-10: qty 50

## 2018-05-10 MED ORDER — METHYLPREDNISOLONE SODIUM SUCC 40 MG IJ SOLR
40.0000 mg | Freq: Every day | INTRAMUSCULAR | Status: DC
  Administered 2018-05-10 – 2018-05-11 (×2): 40 mg via INTRAVENOUS
  Filled 2018-05-10 (×2): qty 1

## 2018-05-10 MED ORDER — NALOXONE HCL 0.4 MG/ML IJ SOLN
INTRAMUSCULAR | Status: AC
  Filled 2018-05-10: qty 1

## 2018-05-10 MED ORDER — LIDOCAINE HCL 1 % IJ SOLN
INTRAMUSCULAR | Status: AC
  Filled 2018-05-10: qty 20

## 2018-05-10 MED ORDER — SODIUM CHLORIDE 0.9% FLUSH
5.0000 mL | Freq: Two times a day (BID) | INTRAVENOUS | Status: DC
  Administered 2018-05-10 – 2018-05-19 (×15): 5 mL

## 2018-05-10 MED ORDER — FENTANYL CITRATE (PF) 100 MCG/2ML IJ SOLN
INTRAMUSCULAR | Status: AC | PRN
  Administered 2018-05-10 (×2): 12.5 ug via INTRAVENOUS
  Administered 2018-05-10: 25 ug via INTRAVENOUS

## 2018-05-10 MED ORDER — FENTANYL CITRATE (PF) 100 MCG/2ML IJ SOLN
INTRAMUSCULAR | Status: AC
  Filled 2018-05-10: qty 2

## 2018-05-10 NOTE — Consult Note (Addendum)
Chief Complaint: Patient was seen in consultation today for percutaneous cholecystostomy Chief Complaint  Patient presents with  . Abdominal Pain    RUQ    Referring Physician(s): Hoxworth,B  Supervising Physician: Aletta Edouard  Patient Status: [facility] - In-pt  History of Present Illness: Eric [name] is an 84 y.o. male with multiple medical problems including aortic sclerosis, ischemic cardiomyopathy, CML, O2 dependent COPD, coronary artery disease with prior CABG in 1995 and latest stenting in 2012, non-STEMI on 04/12/2018 , afib on eliquis, pacemaker, diabetes, diastolic dysfunction, diverticulosis, GERD, hyperlipidemia, hypertension, obesity, peripheral vascular disease, small bowel obstruction 2010, sigmoid colectomy with colostomy 2016, and prostate cancer with seed implants.  He was admitted to [facility] on 12/1 with persistent right upper quadrant pain, nausea, vomiting, dyspnea and cough.  Subsequent imaging has revealed large gallbladder with hazy pericholecystic fat stranding suspicious for acute cholecystitis, no cholelithiasis, no biliary dilatation, multiple scattered hepatic cysts,  opacities within the bilateral lung bases right greater than left concerning for infiltrate/pneumonia with associated bilateral effusions, large left lower quadrant parastomal hernia containing loops of small bowel without inflammation or obstruction.  Nuclear medicine hepatobiliary follow-up scan revealed no filling of the gallbladder consistent with cystic duct obstruction and probable acute cholecystitis.  Patient is a poor surgical candidate.  Request now received from surgery for percutaneous cholecystostomy.  Current laboratory data reveals WBC 18.9, hemoglobin 7.8, platelets 126k, creatinine 1.67, potassium 3.5, total bilirubin 1.0, pro time 16.9, INR 1.39.  Blood cultures reveal Enterobacter and E. coli.  Patient currently on Maxipime and Flagyl as well as IV heparin.  He is  afebrile.  Past Medical History:  Diagnosis Date  . ALLERGIC RHINITIS   . ANEMIA-NOS   . AORTIC SCLEROSIS   . Asthma   . CARDIOMYOPATHY, ISCHEMIC   . CAROTID BRUIT, RIGHT 02/27/2008  . Cataract    surgery  . CML (chronic myeloid leukemia) ([place]) 06/26/2015  . COPD   . CORONARY ARTERY DISEASE    a. s/p CABG in 1995 b. DES in 2008, 2009, and most recent in 2012 with DES to SVG-OM  . DIABETES MELLITUS-TYPE II    diet controlled  . Diastolic dysfunction, Grade 1 11/24/2014  . Diverticulitis of colon with perforation 11/22/2014  . Diverticulosis   . GERD   . HIATAL HERNIA   . Hx of echocardiogram    Echo (9/15):  Mild LVH, EF 50-55%, no RWMA, Gr 1 DD, MAC, mild LAE.  [name] HYPERLIPIDEMIA   . HYPERTENSION   . Hyponatremia 11/22/2014  . IBS (irritable bowel syndrome)   . LACTOSE INTOLERANCE   . OA (osteoarthritis)   . OBESITY   . On home oxygen therapy    "3L all the time" (10/12/2016)  . Partial small bowel obstruction ([place])   . PERIPHERAL VASCULAR DISEASE   . Primary hyperparathyroidism ([place])    Lab Results Component Value Date  PTH 150.7* 02/13/2013  CALCIUM 11.0* 02/13/2013  CAION 1.21 03/15/2008    . Prostate cancer ([place])    seed implants 2004  . SICK SINUS/ TACHY-BRADY SYNDROME 09/2007   s/p PPM st judes  . Sleep apnea   . SMALL BOWEL OBSTRUCTION 04/18/2009   Qualifier: History of  By: Asa Lente MD, Jannifer Rodney Symptomatic diverticulosis 01/18/2009   Qualifier: Diagnosis of  By: Shane Crutch, Amy S     Past Surgical History:  Procedure Laterality Date  . BACK SURGERY    . Bilateral cataracts    . COLON  RESECTION N/A 11/28/2014   Procedure: EXPLORATORY LAPAROTOMY, SIGMOID COLECTOMY WITH COLOSTOMY;  Surgeon: Jackolyn Confer, MD;  Location: [facility] ORS;  Service: General;  Laterality: N/A;  . COLON SURGERY    . COLONOSCOPY    . CORONARY ARTERY BYPASS GRAFT    . ESOPHAGOGASTRODUODENOSCOPY  multiple  . FLEXIBLE SIGMOIDOSCOPY N/A 09/22/2013   Procedure: FLEXIBLE SIGMOIDOSCOPY;  Surgeon:  Gatha Mayer, MD;  Location: [facility] ENDOSCOPY;  Service: Endoscopy;  Laterality: N/A;  . INGUINAL HERNIA REPAIR Bilateral   . LUMBAR [place] SURGERY  12/2008  . PACEMAKER INSERTION     DDD/[employer]         Last interrogation 2/13  on chart     Pacemaker guideline order Dr Tamala Julian on chart  . Partial small bowel obstruction  2009  . PENILE PROSTHESIS PLACEMENT    . PTCA  2008, 2009, 2012   with DES  . REMOVAL OF PENILE PROSTHESIS N/A 04/22/2017   Procedure: EXPLANT OF MULTICOMPONENT PENILE PROSTHESIS;  Surgeon: Irine Seal, MD;  Location: [facility] ORS;  Service: Urology;  Laterality: N/A;  . TOTAL HIP ARTHROPLASTY  08/21/2011   Procedure: TOTAL HIP ARTHROPLASTY;  Surgeon: Johnn Hai, MD;  Location: [facility];  Service: Orthopedics;  Laterality: Right;    Allergies: Actos [pioglitazone hydrochloride]; Buprenorphine hcl; Celebrex [celecoxib]; Demerol; Meperidine; Morphine and related; Ciprofloxacin; Metformin; and Zocor [simvastatin]  Medications: Prior to Admission medications   Medication Sig Start Date End Date Taking? Authorizing Provider  amiodarone (PACERONE) 200 MG tablet Take 1 tablet (200 mg total) by mouth 2 (two) times daily. 04/20/18  Yes Buriev, Arie Sabina, MD  apixaban (ELIQUIS) 5 MG TABS tablet Take 2.5 mg by mouth 2 (two) times daily.   Yes [provider]  aspirin EC 81 MG EC tablet Take 1 tablet (81 mg total) by mouth daily. 04/21/18  Yes Buriev, Arie Sabina, MD  budesonide (PULMICORT) 0.25 MG/2ML nebulizer solution Take 2 mLs (0.25 mg total) by nebulization 2 (two) times daily. 12/06/17  Yes Lauraine Rinne, NP  diltiazem (CARDIZEM CD) 180 MG 24 hr capsule Take 1 capsule (180 mg total) by mouth daily. 04/21/18  Yes Buriev, Arie Sabina, MD  fluticasone (FLONASE) 50 MCG/ACT nasal spray Place 2 sprays into both nostrils daily. Allergies 08/01/14  Yes [name], MD  furosemide (LASIX) 40 MG tablet Take 1 tablet (40 mg total) by mouth 2 (two) times daily. 03/31/18  Yes  Belva Crome, MD  HYDROcodone-acetaminophen (NORCO) 7.5-325 MG tablet Take 1 tablet by mouth every 6 (six) hours as needed for moderate pain or severe pain.   Yes [provider]  insulin aspart (NOVOLOG) 100 UNIT/ML injection Inject 0-5 Units into the skin at bedtime. Insulin sliding scale Patient taking differently: Inject 0-9 Units into the skin 3 (three) times daily with meals. Insulin sliding scale  101-150=1 units 151-200=2 units 201-250=3 units 251-300=5 units 301-350=7 units >350=9 units Call MD for BS>400 or <60 04/20/18  Yes Buriev, Arie Sabina, MD  ipratropium-albuterol (DUONEB) 0.5-2.5 (3) MG/3ML SOLN Take 3 mLs by nebulization 3 (three) times daily.  02/17/17  Yes [provider]  isosorbide mononitrate (IMDUR) 30 MG 24 hr tablet Take 0.5 tablets (15 mg total) by mouth daily. 04/21/18  Yes Buriev, Arie Sabina, MD  pravastatin (PRAVACHOL) 20 MG tablet Take 20 mg by mouth daily.    Yes [provider]  ranitidine (ZANTAC) 300 MG tablet Take 600 mg by mouth at bedtime.    Yes [provider]  senna (SENOKOT) 8.6 MG tablet Take 2 tablets by mouth daily.    Yes [provider]  acetaminophen (TYLENOL) 500 MG tablet Take 1,000 mg by mouth every 6 (six) hours as needed for mild pain or fever.    [provider]  apixaban (ELIQUIS) 2.5 MG TABS tablet Take 1 tablet (2.5 mg total) by mouth 2 (two) times daily. Patient not taking: Reported on 05/08/2018 04/20/18   Kinnie Feil, MD  glucose blood (ONE TOUCH ULTRA TEST) test strip USE TO CHECK BLOOD SUGARS ONCE A DAY 10/28/17   [name], MD  guaiFENesin-dextromethorphan (ROBITUSSIN DM) 100-10 MG/5ML syrup Take 5 mLs by mouth every 4 (four) hours as needed for cough. 10/15/16   Lavina Hamman, MD     Family History  Problem Relation Age of Onset  . Hypertension Mother   . Cancer Mother   . Heart attack Unknown   . Heart attack Unknown   . Heart attack Brother   . Colon  cancer Neg Hx   . Stroke Neg Hx     Social History   Socioeconomic History  . Marital status: Widowed    Spouse name: Not on file  . Number of children: Not on file  . Years of education: Not on file  . Highest education level: Not on file  Occupational History  . Occupation: retired    Fish farm manager: RETIRED  Social Needs  . Financial resource strain: Somewhat hard  . Food insecurity:    Worry: Patient refused    Inability: Patient refused  . Transportation needs:    Medical: Patient refused    Non-medical: Patient refused  Tobacco Use  . Smoking status: Former Smoker    Packs/day: 1.00    Years: 25.00    Pack years: 25.00    Types: Cigarettes    Last attempt to quit: 06/08/1994    Years since quitting: 23.9  . Smokeless tobacco: Never Used  Substance and Sexual Activity  . Alcohol use: No    Alcohol/week: 0.0 standard drinks  . Drug use: No  . 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 Physical activity:    Days per week: Patient refused    Minutes per session: Patient refused  . Stress: Not on file  Relationships  . Social connections:    Talks on phone: Patient refused    Gets together: Patient refused    Attends religious service: Patient refused    Active member of club or organization: Patient refused    Attends meetings of clubs or organizations: Patient refused    Relationship status: Patient refused  Other Topics Concern  . Not on file  Social History Narrative  . Not on file      Review of Systems see above; he is hard of hearing, continues to have abdominal pain primarily right upper quadrant.  Vital Signs: BP (!) 138/56 (BP Location: Right Arm)   Pulse 86   Temp 97.9 F (36.6 C) (Oral)   Resp (!) 24   Ht _0  (1.676 m) Comment: 3 weeks ago per previous charting  Wt 228 lb (103.4 kg)   SpO2 95%   BMI 36.80 kg/m   Physical Exam patient awake but slightly confused.   States the year is 1900 and asking if it is daytime ;chest with scattered rhonchi bilaterally diminished breath sounds at bases.  Chest wall pacer.  Heart with regular  rate and rhythm.  Abdomen obese, soft, intact left lower quadrant ostomy, parastomal hernia, +BS; moderately tender right upper quadrant with some mild diffuse tenderness throughout abd; ext with no sig edema  Imaging: Nm Hepatobiliary Liver Func  Result Date: 05/09/2018 CLINICAL DATA:  Right upper quadrant abdominal pain. EXAM: NUCLEAR MEDICINE HEPATOBILIARY IMAGING TECHNIQUE: Sequential images of the abdomen were obtained out to 60 minutes following intravenous administration of radiopharmaceutical. RADIOPHARMACEUTICALS:  4.9 mCi Tc-38m Choletec IV COMPARISON:  CT scan and ultrasound of May 08, 2018. FINDINGS: Prompt uptake and biliary excretion of activity by the liver is seen. No filling of the gallbladder is noted. Morphine was not administered due to allergy. Biliary activity passes into small bowel, consistent with patent common bile duct. IMPRESSION: No filling of gallbladder is noted consistent with cystic duct obstruction and possible acute cholecystitis. Electronically Signed   By: JMarijo Conception M.D.   On: 05/09/2018 17:03   Ct Abdomen Pelvis W Contrast  Result Date: 05/08/2018 CLINICAL DATA:  Initial evaluation for acute right upper quadrant pain, cough and congestion. EXAM: CT ABDOMEN AND PELVIS WITH CONTRAST TECHNIQUE: Multidetector CT imaging of the abdomen and pelvis was performed using the standard protocol following bolus administration of intravenous contrast. CONTRAST:  1033mISOVUE-300 IOPAMIDOL (ISOVUE-300) INJECTION 61% COMPARISON:  Prior CT from 12/07/2014. FINDINGS: Lower chest: Dense consolidative opacities present within the bilateral lung bases, right slightly worse than left. Superimposed peripheral pleural based calcifications noted. Associated bilateral pleural effusions, complex on the left with  loculated appearance. Ill-defined density within the left effusion noted (series 2, image 10). Mild cardiomegaly. Cardiac pacemaker electrodes partially visualized. Hepatobiliary: Multiple scattered cysts seen within the liver, largest of which position within the left hepatic lobe in measures 4.4 cm, similar to previous. Liver demonstrates no acute finding. Gallbladder is enlarged and hydropic in appearance with hazy pericholecystic fat stranding, suspicious for possible acute cholecystitis. No radiopaque calculi identified. No biliary dilatation. Pancreas: Diffuse fatty infiltration of the pancreas noted. Pancreas otherwise unremarkable without acute peripancreatic inflammation. Spleen: Spleen within normal limits. Adrenals/Urinary Tract: Adrenal glands are normal. Kidneys equal in size with symmetric enhancement. 3.4 cm simple left renal cyst noted. Additional scattered subcentimeter hypodensities too small the characterize, but statistically likely reflects small cysts as well. No nephrolithiasis or hydronephrosis. No focal enhancing renal mass. No hydroureter. Partially distended bladder within normal limits. Stomach/Bowel: Stomach within normal limits. No evidence for bowel obstruction. Patient is status post sigmoid colectomy with left lower quadrant colostomy in place. Large parastomal hernia containing fat and loops of small bowel present within the left lower quadrant without associated inflammation or obstruction. Neck of the hernia measures approximately 4.1 cm in diameter. Additional ventral hernia seen just to the right of midline at the paraumbilical region contains fat and a portion of the transverse colon (series 2, image 48). No associated complication. Vascular/Lymphatic: Advanced aorto bi-iliac atherosclerotic disease. No aneurysm. Mesenteric vessels are patent proximally. No adenopathy. Reproductive: Brachytherapy seeds overlie the prostate. Other: No free air or fluid. Musculoskeletal: Right  total hip arthroplasty in place. No acute osseus abnormality. No discrete lytic or blastic osseous lesions. IMPRESSION: 1. Enlarged and hydropic gallbladder with hazy pericholecystic fat stranding, suspicious for acute cholecystitis. Correlation with laboratory values recommended. Additionally, further evaluation with dedicated right upper quadrant ultrasound could be performed for further evaluation as clinically warranted. 2. Dense consolidative opacities within the bilateral lung bases, right greater than left, concerning for infiltrates/pneumonia. Associated bilateral pleural effusions, complex in appearance on the left  with increased internal density, raising the possibility for empyema. Please note that a malignant process could also have this appearance. Correlation with fluid analysis may be helpful for further evaluation as clinically warranted. 3. Large left lower quadrant parastomal hernia containing loops of small bowel without associated inflammation or obstruction. 4. Advanced aorto bi-iliac atherosclerotic disease. No aneurysm. Electronically Signed   By: Jeannine Boga M.D.   On: 05/08/2018 05:42   US Abdomen Limited  Result Date: 05/08/2018 CLINICAL DATA:  Initial evaluation for acute right upper quadrant pain. EXAM: ULTRASOUND ABDOMEN LIMITED RIGHT UPPER QUADRANT COMPARISON:  CT from same day. Please note that CT was performed following this examination, although this exam only became available for review after the CT was completed. FINDINGS: Gallbladder: No stones or sludge within the gallbladder lumen. Gallbladder wall measure within normal limits at 2.3 mm. No free pericholecystic fluid. A positive sonographic Murphy sign was elicited on exam. Common bile duct: Diameter: 5 mm Liver: Multiple scattered hepatic cysts noted, largest of which is septated and measures 4.4 cm in the left hepatic lobe. These are grossly stable from prior CTs. Within normal limits in parenchymal echogenicity.  Portal vein is patent on color Doppler imaging with normal direction of blood flow towards the liver. IMPRESSION: 1. Positive Murphy sign without additional sonographic features for acute cholecystitis. No cholelithiasis. 2. No biliary dilatation. 3. Multiple scattered hepatic cysts, better evaluated on previous CTs. Electronically Signed   By: Jeannine Boga M.D.   On: 05/08/2018 06:42   Dg Chest Port 1 View  Result Date: 05/09/2018 CLINICAL DATA:  Respiratory failure EXAM: PORTABLE CHEST 1 VIEW COMPARISON:  04/12/2018 FINDINGS: Cardiac shadow is stable. Postsurgical changes are again seen. Pacing device is again noted and stable. Increased vascular congestion with interstitial edema is noted. Chronic blunting of left costophrenic angle is noted. Left basilar scarring is again noted. IMPRESSION: Changes of congestive heart failure with edema. Chronic changes in the left base appear Electronically Signed   By: Inez Catalina M.D.   On: 05/09/2018 12:11   Dg Chest Port 1 View  Result Date: 04/12/2018 CLINICAL DATA:  Chronic shortness of breath. EXAM: PORTABLE CHEST 1 VIEW COMPARISON:  11/23/2017 FINDINGS: The cardiac silhouette, mediastinal and hilar contours are normal and stable. Stable surgical changes from bypass surgery. Stable pacer wires. Chronic left basilar pleural thickening and parenchymal scarring. Stable emphysematous changes and areas of pulmonary scarring. No definite acute overlying pulmonary process. The bony thorax is intact. IMPRESSION: 1. Chronic lung changes with emphysema and pulmonary scarring. 2. Chronic left basilar pleural thickening and parenchymal scarring. 3. No acute overlying pulmonary findings. Electronically Signed   By: Marijo Sanes M.D.   On: 04/12/2018 12:16    Labs:  CBC: Recent Labs    05/09/18 1152 05/09/18 1839 05/10/18 0016 05/10/18 0539  WBC 24.6* 21.4* 20.8* 18.9*  HGB 8.0* 8.2* 7.8* 7.8*  HCT 26.5* 27.5* 25.6* 26.9*  PLT 140* 154 137* 126*     COAGS: Recent Labs    04/16/18 0209 04/17/18 0252 04/18/18 0227 05/09/18 1152 05/10/18 0016 05/10/18 0824  INR 1.29 1.36 1.31 1.39  --   --   APTT  --   --   --  40* 74* 70*    BMP: Recent Labs    04/19/18 0234 05/08/18 0328 05/09/18 0548 05/10/18 0539  NA 132* 134* 139 142  K 4.1 3.3* 3.8 3.5  CL 99 96* 101 105  CO2 _0 GLUCOSE 163* 171* 134*  135*  BUN 36* 21 25* 35*  CALCIUM 9.5 9.4 9.0 9.0  CREATININE 1.55* 1.41* 1.58* 1.67*  GFRNONAA 38* 44* 38* 36*  GFRAA 44* 51* 44* 41*    LIVER FUNCTION TESTS: Recent Labs    04/16/18 0209 05/08/18 0328 05/09/18 0548 05/10/18 0539  BILITOT 1.0 1.6* 1.3* 1.0  AST _0 ALT _1 ALKPHOS 53 75 77 80  PROT 6.3* 7.5 6.5 6.1*  ALBUMIN 3.6 3.6 2.8* 2.7*    TUMOR MARKERS: No results for input(s): AFPTM, CEA, CA199, CHROMGRNA in the last 8760 hours.  Assessment and Plan: 84 y.o. male with multiple medical problems including aortic sclerosis, ischemic cardiomyopathy, CML, O2 dependent COPD, coronary artery disease with prior CABG in 1995 and latest stenting in 2012, non-STEMI on 04/12/2018 , afib on eliquis, pacemaker, diabetes, diastolic dysfunction, diverticulosis, GERD, hyperlipidemia, hypertension, obesity, peripheral vascular disease, small bowel obstruction 2010, sigmoid colectomy with colostomy 2016, and prostate cancer with seed implants.  He was admitted to [facility] on 12/1 with persistent right upper quadrant pain, nausea, vomiting, dyspnea and cough.  Subsequent imaging has revealed large gallbladder with hazy pericholecystic fat stranding suspicious for acute cholecystitis, no cholelithiasis, no biliary dilatation, multiple scattered hepatic cysts,  opacities within the bilateral lung bases right greater than left concerning for infiltrate/pneumonia with associated bilateral effusions, large left lower quadrant parastomal hernia containing loops of small bowel without inflammation or  obstruction.  Nuclear medicine hepatobiliary follow-up scan revealed no filling of the gallbladder consistent with cystic duct obstruction and probable acute cholecystitis.  Patient is a poor surgical candidate.  Request now received from surgery for percutaneous cholecystostomy.  Current laboratory data reveals WBC 18.9, hemoglobin 7.8, platelets 126k, creatinine 1.67, potassium 3.5, total bilirubin 1.0, pro time 16.9, INR 1.39.  Blood cultures reveal Enterobacter and E. coli.  Patient currently on Maxipime and Flagyl as well as IV heparin.  He is afebrile.  Imaging studies have been reviewed by Dr. Kathlene Cote.  Details/risks of procedure, including but not limited to, internal bleeding, infection, injury to adjacent structures, need for prolonged drainage discussed with pt/patient's daughter, Eric [name] ([phone]), with her understanding and consent.  Procedure tentatively scheduled for this afternoon. IV heparin has been held.   Thank you for this interesting consult.  I greatly enjoyed meeting Eric [name] and look forward to participating in their care.  A copy of this report was sent to the requesting provider on this date.  Electronically Signed: D. Rowe Robert, PA-C 05/10/2018, 11:15 AM   I spent a total of 40 minutes  in face to face in clinical consultation, greater than 50% of which was counseling/coordinating care for percutaneous cholecystostomy

## 2018-05-10 NOTE — Procedures (Signed)
Interventional Radiology Procedure Note  Procedure: Percutaneous cholecystostomy  Complications: None  Estimated Blood Loss: < 10 mL  Findings: Dark bile aspirated from distended GB; sample sent for culture. 10 Fr drain placed in GB lumen and attached to gravity bag. Placed via transhepatic route due to nearby colon next to GB.  Venetia Night. Kathlene Cote, M.D Pager:  [phone]

## 2018-05-10 NOTE — Progress Notes (Signed)
PHARMACY - HEPARIN (brief note)  Patient s/p percutaneous drain placement.  RN made pharmacy aware blood noted coming from drain.  IR has requested IV heparin infusion be resumed @ 18:30.  Previous aPTT = 70 with heparin infusing @ 1300 units/hr (low end of therapeutic goal of 66-102 sec).  Will resume heparin at previous rate and will f/u monitoring of bleeding from drain.  Will check aPTT 8 hrs after heparin restarted  Leone Haven, PharmD

## 2018-05-10 NOTE — Progress Notes (Signed)
ANTICOAGULATION CONSULT NOTE - Follow Up Consult  Pharmacy Consult for Heparin Indication: atrial fibrillation  Allergies  Allergen Reactions  . Actos [Pioglitazone Hydrochloride] Other (See Comments)    "felt funny, drowsy, and weak":  [name] Buprenorphine Hcl Nausea And Vomiting  . Celebrex [Celecoxib] Other (See Comments)    "felt funny"  . Demerol Palpitations and Other (See Comments)    Increased BP  . Meperidine Palpitations    Other reaction(s): Other (See Comments) Increased BP  . Morphine And Related Nausea And Vomiting  . Ciprofloxacin Other (See Comments)    arthralgia  . Metformin Nausea And Vomiting  . Zocor [Simvastatin] Other (See Comments)    Makes pt very drowsy    Patient Measurements: Height: 5\' 6"  (167.6 cm)(3 weeks ago per previous charting) Weight: 228 lb (103.4 kg) IBW/kg (Calculated) : 63.8 Heparin Dosing Weight:   Vital Signs: Temp: 97.5 F (36.4 C) (12/02 2032) Temp Source: Oral (12/02 2032) BP: 124/49 (12/02 2032) Pulse Rate: 92 (12/02 2032)  Labs: Recent Labs    05/08/18 0328 05/09/18 0548 05/09/18 1152 05/09/18 1839 05/10/18 0016  HGB 10.3* 8.3* 8.0* 8.2* 7.8*  HCT 33.4* 26.7* 26.5* 27.5* 25.6*  PLT 161 167 140* 154 137*  APTT  --   --  40*  --  74*  LABPROT  --   --  16.9*  --   --   INR  --   --  1.39  --   --   HEPARINUNFRC  --   --  >2.20*  --   --   CREATININE 1.41* 1.58*  --   --   --     Estimated Creatinine Clearance: 35.7 mL/min (A) (by C-G formula based on SCr of 1.58 mg/dL (H)).   Medications:  Infusions:  . sodium chloride 10 mL/hr at 05/09/18 1034  . ceFEPime (MAXIPIME) IV 2 g (05/09/18 2124)  . heparin 1,300 Units/hr (05/09/18 1406)  . metronidazole 500 mg (05/10/18 0129)  . vancomycin      Assessment: Patient with PTT at goal.  PTT ordered with Heparin level until both correlate due to possible drug-lab interaction between oral anticoagulant (rivaroxaban, edoxaban, or apixaban) and anti-Xa level (aka heparin  level) No heparin issues noted.  Goal of Therapy:  Heparin level 0.3-0.7 units/ml aPTT 66-102 seconds Monitor platelets by anticoagulation protocol: Yes   Plan:  Continue heparin drip at current rate Recheck level at 0800  Tyler Deis, [place] 05/10/2018,6:02 AM

## 2018-05-10 NOTE — Care Management Note (Signed)
Case Management Note  Patient Details  Name: Eric [name] MRN: [id number] Date of Birth: 10-Oct-1926  Subjective/Objective:  Acute cholecystitis. From SNF-[facility]-CSW following for return. DNR.IR-Percutaneous drain placement. Bilateral PNA,effusions. On 02.                    Action/Plan:dc back to SNF.   Expected Discharge Date:  (unknown)               Expected Discharge Plan:  Skilled Nursing Facility  In-House Referral:  Clinical Social Work  Discharge planning Services  CM Consult  Post Acute Care Choice:    Choice offered to:     DME Arranged:    DME Agency:     HH Arranged:    [place] Agency:     Status of Service:  In process, will continue to follow  If discussed at Long Length of Stay Meetings, dates discussed:    Additional Comments:  Dessa Phi, RN 05/10/2018, 11:43 AM

## 2018-05-10 NOTE — Progress Notes (Signed)
PROGRESS NOTE    Eric [name]  HYI:[id number] DOB: 10-06-1926 DOA: 05/08/2018 PCP: [name], MD   Brief Narrative: Eric [name] is a 84 y.o. male with medical history significant for coronary artery disease and aortic sclerosis hypertension hyperlipidemia, diabetes, COPD, O2 dependent on 3 L at home, chronic respiratory failure on oxygen, ischemic cardiomyopathy, atrial fibrillation on Eliquis. Patient presented with right upper quadrant pain concerning for acute cholecystitis, but also found to have evidence of bilateral pneumonia and positive blood cultures for E. Coli.   Assessment & Plan:   Active Problems:   Type 2 diabetes mellitus, controlled ([place])   Hypertensive heart disease with CHF ([place])   COPD mixed type (HCC)   Obstructive sleep apnea   Chronic diastolic heart failure (HCC)   Obesity (BMI 30-39.9)   Hartmann's pouch of intestine (HCC)   Essential hypertension   Atrial fibrillation with RVR (HCC)   Acute cholecystitis   Pneumonia   Pneumonia due to other specified infectious organisms   RUQ pain Acute. Likely cholecystitis from CT scan report. No elevated AST/ALT/Alk phos. Pain significant. No nausea/vomiting. HIDA suggests acute cholecystitis -Continue Flagyl, switch to ceftriaxone -General surgery recommendations: Percutaneous drain per IR  Bilateral pneumonia Seen on CT abdomen/pelvis. Associated respiratory failure. MRSA pcr negative -Discontinue vancomycin and cefepime -Switch to Ceftriaxone  E. Coli bacteremia Likely abdominal source. -Continue antibiotics as mentioned above  COPD exacerbation Acute on chronic respiratory failure Complicating overall picture. One 3L oxygen at home. Currently up to 5L. Currently stable -Continue Duoneb -Start Solu-medrol  Acute on chronic diastolic heart failure Contributing to respiratory failure. Weight up slightly. -Lasix 80 mg IV -Daily weights and strict in and  out  Leukocytosis Secondary to infection. Trending down slowly.  Atrial fibrillation Unspecified. Rate controlled. Holdnig Eliquis in anticipation secondary to procedure -Heparin drip -Continue home diltiazem and amiodarone when able to take by mouth  Anemia Chronic. Some mild macrocytosis. Folate and B12 normal. Iron and TIBC low with a normal ferritin. Would favor anemia of chronic disease with this picture with associated macrocytosis.  Essential hypertension Mostly normal -home diltiazem for atrial fibrillation -Hold Imdur secondary to bacteremia to allow more room for blood pressure  Diabetes mellitus, type 2 -Continue SSI  Obesity Body mass index is 36.8 kg/m.   DVT prophylaxis: Heparin drip Code Status:   Code Status: DNR Family Communication: None at bedside Disposition Plan: Discharge pending surgery management and treatment of multiple infections   Consultants:   General surgery  Procedures:   None  Antimicrobials:  Vancomycin (11/30>>12/02)  Cefepime (12/1>>12/3)  Ceftriaxone (12/3>>  Metronidazole (12/1>>   Subjective: Abdominal pain. Thirsty.  Objective: Vitals:   05/10/18 0138 05/10/18 0637 05/10/18 0724 05/10/18 1343  BP:  (!) 138/56  133/64  Pulse:  86  77  Resp:  (!) 24  (!) 22  Temp:  97.9 F (36.6 C)  98.2 F (36.8 C)  TempSrc:  Oral  Oral  SpO2: 96% 96% 95% 95%  Weight:      Height:        Intake/Output Summary (Last 24 hours) at 05/10/2018 1548 Last data filed at 05/10/2018 1343 Gross per 24 hour  Intake 2297.77 ml  Output 1900 ml  Net 397.77 ml   Filed Weights   05/08/18 2109 05/09/18 0043  Weight: 101.7 kg 103.4 kg    Examination:  General exam: Appears calm and comfortable Respiratory system: Diminished, wheezing, rhonchi. Respiratory effort normal. Cardiovascular system: S1 & S2 heard, RRR. No  murmurs, rubs, gallops or clicks. Gastrointestinal system: Abdomen is nondistended, soft and tender in upper left and  epigastric areas. Normal bowel sounds heard. Central nervous system: Alert and oriented. No focal neurological deficits. Extremities: No calf tenderness Skin: No cyanosis. No rashes Psychiatry: Judgement and insight appear normal. Mood & affect appropriate.     Data Reviewed: I have personally reviewed following labs and imaging studies  CBC: Recent Labs  Lab 05/09/18 0548 05/09/18 1152 05/09/18 1839 05/10/18 0016 05/10/18 0539  WBC 23.1* 24.6* 21.4* 20.8* 18.9*  NEUTROABS 17.3*  --   --   --   --   HGB 8.3* 8.0* 8.2* 7.8* 7.8*  HCT 26.7* 26.5* 27.5*  23.4* 25.6* 26.9*  MCV 100.8* 102.7* 100.7* 102.4* 106.3*  PLT 167 140* 154 137* 321*   Basic Metabolic Panel: Recent Labs  Lab 05/08/18 0328 05/09/18 0548 05/10/18 0539  NA 134* 139 142  K 3.3* 3.8 3.5  CL 96* 101 105  CO2 _0 GLUCOSE 171* 134* 135*  BUN 21 25* 35*  CREATININE 1.41* 1.58* 1.67*  CALCIUM 9.4 9.0 9.0  MG 2.0  --   --   PHOS 2.4*  --   --    GFR: Estimated Creatinine Clearance: 33.8 mL/min (A) (by C-G formula based on SCr of 1.67 mg/dL (H)). Liver Function Tests: Recent Labs  Lab 05/08/18 0328 05/09/18 0548 05/10/18 0539  AST _1 ALT _2 ALKPHOS 75 77 80  BILITOT 1.6* 1.3* 1.0  PROT 7.5 6.5 6.1*  ALBUMIN 3.6 2.8* 2.7*   Recent Labs  Lab 05/08/18 0328  LIPASE 20   No results for input(s): AMMONIA in the last 168 hours. Coagulation Profile: Recent Labs  Lab 05/09/18 1152  INR 1.39   Cardiac Enzymes: No results for input(s): CKTOTAL, CKMB, CKMBINDEX, TROPONINI in the last 168 hours. BNP (last 3 results) Recent Labs    10/19/17 1448  PROBNP 473   HbA1C: No results for input(s): HGBA1C in the last 72 hours. CBG: No results for input(s): GLUCAP in the last 168 hours. Lipid Profile: No results for input(s): CHOL, HDL, LDLCALC, TRIG, CHOLHDL, LDLDIRECT in the last 72 hours. Thyroid Function Tests: No results for input(s): TSH, T4TOTAL, FREET4, T3FREE, THYROIDAB  in the last 72 hours. Anemia Panel: Recent Labs    05/09/18 1839  VITAMINB12 1,145*  FERRITIN 294  TIBC 182*  IRON 9*   Sepsis Labs: No results for input(s): PROCALCITON, LATICACIDVEN in the last 168 hours.  Recent Results (from the past 240 hour(s))  Blood culture (routine x 2)     Status: Abnormal   Collection Time: 05/08/18  6:09 AM  Result Value Ref Range Status   Specimen Description   Final    BLOOD RIGHT ARM Performed at [facility] [address], [place], [place] [address]    Special Requests   Final    BOTTLES DRAWN AEROBIC AND ANAEROBIC Blood Culture adequate volume Performed at [facility] [address] [place], [place] [address]    Culture  Setup Time   Final    GRAM NEGATIVE RODS IN BOTH AEROBIC AND ANAEROBIC BOTTLES CRITICAL VALUE NOTED.  VALUE IS CONSISTENT WITH PREVIOUSLY REPORTED AND CALLED VALUE.    Culture (A)  Final    ESCHERICHIA COLI SUSCEPTIBILITIES PERFORMED ON PREVIOUS CULTURE WITHIN THE LAST 5 DAYS. Performed at [facility], [place] [address] [place], [place] [address]    Report Status 05/10/2018  FINAL  Final  Blood culture (routine x 2)     Status: Abnormal   Collection Time: 05/08/18  6:16 AM  Result Value Ref Range Status   Specimen Description   Final    BLOOD LEFT HAND Performed at [facility] [address] [place], [place] [address]    Special Requests   Final    BOTTLES DRAWN AEROBIC AND ANAEROBIC Blood Culture adequate volume Performed at [facility] [address], [place], [place] [address]    Culture  Setup Time   Final    GRAM NEGATIVE RODS IN BOTH AEROBIC AND ANAEROBIC BOTTLES CRITICAL RESULT CALLED TO, READ BACK BY AND VERIFIED WITH: B.GREEN,PHARMD AT 0026 ON 05/09/18 BY G.MCADOO Performed at [facility], [place] [address], [place], [place] [address]    Culture ESCHERICHIA COLI (A)  Final   Report Status 05/10/2018 FINAL  Final    Organism ID, Bacteria ESCHERICHIA COLI  Final      Susceptibility   Escherichia coli - MIC*    AMPICILLIN >=32 RESISTANT Resistant     CEFAZOLIN >=64 RESISTANT Resistant     CEFEPIME <=1 SENSITIVE Sensitive     CEFTAZIDIME <=1 SENSITIVE Sensitive     CEFTRIAXONE <=1 SENSITIVE Sensitive     CIPROFLOXACIN <=0.25 SENSITIVE Sensitive     GENTAMICIN <=1 SENSITIVE Sensitive     IMIPENEM <=0.25 SENSITIVE Sensitive     TRIMETH/SULFA >=320 RESISTANT Resistant     AMPICILLIN/SULBACTAM >=32 RESISTANT Resistant     PIP/TAZO 64 INTERMEDIATE Intermediate     Extended ESBL NEGATIVE Sensitive     * ESCHERICHIA COLI  Blood Culture ID Panel (Reflexed)     Status: Abnormal   Collection Time: 05/08/18  6:16 AM  Result Value Ref Range Status   Enterococcus species NOT DETECTED NOT DETECTED Final   Listeria monocytogenes NOT DETECTED NOT DETECTED Final   Staphylococcus species NOT DETECTED NOT DETECTED Final   Staphylococcus aureus (BCID) NOT DETECTED NOT DETECTED Final   Streptococcus species NOT DETECTED NOT DETECTED Final   Streptococcus agalactiae NOT DETECTED NOT DETECTED Final   Streptococcus pneumoniae NOT DETECTED NOT DETECTED Final   Streptococcus pyogenes NOT DETECTED NOT DETECTED Final   Acinetobacter baumannii NOT DETECTED NOT DETECTED Final   Enterobacteriaceae species DETECTED (A) NOT DETECTED Final    Comment: Enterobacteriaceae represent a large family of gram-negative bacteria, not a single organism. CRITICAL RESULT CALLED TO, READ BACK BY AND VERIFIED WITH: B.GREEN,PHARMD AT 0026 ON 05/09/18 BY G.MCADOO    Enterobacter cloacae complex NOT DETECTED NOT DETECTED Final   Escherichia coli DETECTED (A) NOT DETECTED Final    Comment: CRITICAL RESULT CALLED TO, READ BACK BY AND VERIFIED WITH: B.GREEN,PHARMD AT 0026 ON 05/09/18 BY G.MCADOO    Klebsiella oxytoca NOT DETECTED NOT DETECTED Final   Klebsiella pneumoniae NOT DETECTED NOT DETECTED Final   Proteus species NOT DETECTED NOT  DETECTED Final   Serratia marcescens NOT DETECTED NOT DETECTED Final   Carbapenem resistance NOT DETECTED NOT DETECTED Final   Haemophilus influenzae NOT DETECTED NOT DETECTED Final   Neisseria meningitidis NOT DETECTED NOT DETECTED Final   Pseudomonas aeruginosa NOT DETECTED NOT DETECTED Final   Candida albicans NOT DETECTED NOT DETECTED Final   Candida glabrata NOT DETECTED NOT DETECTED Final   Candida krusei NOT DETECTED NOT DETECTED Final   Candida parapsilosis NOT DETECTED NOT DETECTED Final   Candida tropicalis NOT DETECTED NOT DETECTED Final    Comment: Performed at [facility]  Lab, [address] [place], [place] [address]  Culture, sputum-assessment     Status: None   Collection Time: 05/09/18  2:09 PM  Result Value Ref Range Status   Specimen Description SPUTUM  Final   Special Requests NONE  Final   Sputum evaluation   Final    THIS SPECIMEN IS ACCEPTABLE FOR SPUTUM CULTURE Performed at [facility], [place] [address] [place], [place] [address]    Report Status 05/09/2018 FINAL  Final  MRSA PCR Screening     Status: None   Collection Time: 05/09/18  2:09 PM  Result Value Ref Range Status   MRSA by PCR NEGATIVE NEGATIVE Final    Comment:        The GeneXpert MRSA Assay (FDA approved for NASAL specimens only), is one component of a comprehensive MRSA colonization surveillance program. It is not intended to diagnose MRSA infection nor to guide or monitor treatment for MRSA infections. Performed at [facility], [place] [address] [place], [place] [address]   Culture, respiratory     Status: None (Preliminary result)   Collection Time: 05/09/18  2:09 PM  Result Value Ref Range Status   Specimen Description   Final    SPUTUM Performed at [facility] [address], [place], [place] [address]    Special Requests   Final    NONE Reflexed from [id number] Performed at [facility], [place]  [address] [place], [place] [address]    Gram Stain   Final    ABUNDANT WBC PRESENT, PREDOMINANTLY PMN RARE GRAM POSITIVE COCCI RARE GRAM NEGATIVE COCCOBACILLI    Culture   Final    CULTURE REINCUBATED FOR BETTER GROWTH Performed at [facility], [place] [address] [place], [place] [address]    Report Status PENDING  Incomplete         Radiology Studies: Nm Hepatobiliary Liver Func  Result Date: 05/09/2018 CLINICAL DATA:  Right upper quadrant abdominal pain. EXAM: NUCLEAR MEDICINE HEPATOBILIARY IMAGING TECHNIQUE: Sequential images of the abdomen were obtained out to 60 minutes following intravenous administration of radiopharmaceutical. RADIOPHARMACEUTICALS:  4.9 mCi Tc-37m Choletec IV COMPARISON:  CT scan and ultrasound of May 08, 2018. FINDINGS: Prompt uptake and biliary excretion of activity by the liver is seen. No filling of the gallbladder is noted. Morphine was not administered due to allergy. Biliary activity passes into small bowel, consistent with patent common bile duct. IMPRESSION: No filling of gallbladder is noted consistent with cystic duct obstruction and possible acute cholecystitis. Electronically Signed   By: JMarijo Conception M.D.   On: 05/09/2018 17:03   Dg Chest Port 1 View  Result Date: 05/09/2018 CLINICAL DATA:  Respiratory failure EXAM: PORTABLE CHEST 1 VIEW COMPARISON:  04/12/2018 FINDINGS: Cardiac shadow is stable. Postsurgical changes are again seen. Pacing device is again noted and stable. Increased vascular congestion with interstitial edema is noted. Chronic blunting of left costophrenic angle is noted. Left basilar scarring is again noted. IMPRESSION: Changes of congestive heart failure with edema. Chronic changes in the left base appear Electronically Signed   By: MInez CatalinaM.D.   On: 05/09/2018 12:11        Scheduled Meds: . amiodarone  200 mg Oral BID  . budesonide  0.25 mg Nebulization BID  . chlorhexidine  15 mL Mouth Rinse BID  .  furosemide  80 mg Intravenous Q12H  . guaiFENesin  1,200 mg Oral BID  . ipratropium-albuterol  3 mL  Nebulization Q6H  . mouth rinse  15 mL Mouth Rinse q12n4p  . methylPREDNISolone (SOLU-MEDROL) injection  40 mg Intravenous Daily  . pantoprazole (PROTONIX) IV  40 mg Intravenous Q24H   Continuous Infusions: . sodium chloride 10 mL/hr at 05/10/18 0836  . cefTRIAXone (ROCEPHIN)  IV    . metronidazole 500 mg (05/10/18 0956)     LOS: 2 days     Cordelia Poche, MD [facility] 05/10/2018, 3:48 PM  If 7PM-7AM, please contact night-coverage [email or web address]

## 2018-05-10 NOTE — Progress Notes (Signed)
CC: Right upper quadrant pain.  Subjective: Patient still having a good deal of pain in his abdomen.  He has had no stool out of his colostomy since the bag was changed yesterday.  His breathing continues to sound horrible, and he remains on oxygen.  He has been seen by IR and his heparin has been stopped.  Objective: Vital signs in last 24 hours: Temp:  [97.5 F (36.4 C)-97.9 F (36.6 C)] 97.9 F (36.6 C) (12/03 0637) Pulse Rate:  [86-98] 86 (12/03 0637) Resp:  [24-26] 24 (12/03 0637) BP: (119-138)/(48-56) 138/56 (12/03 0637) SpO2:  [91 %-96 %] 95 % (12/03 0724) Last BM Date: (colostomy no output at this time) N.p.o. 1204 IV 1425 urine No BM recorded Afebrile vital signs are stable CMP is stable creatinine is still elevated at 1.67. WBC is better down to 18.9 Hemoglobin 7.8/hematocrit 26.9/platelets 126,000. PTT 70 HIDA scan yesterday showed no filling of the gallbladder system with cystic duct obstruction and possible acute appendicitis. Intake/Output from previous day: 12/02 0701 - 12/03 0700 In: 1204.7 [I.V.:322.5; IV Piggyback:882.1] Out: 4403 [Urine:1425] Intake/Output this shift: Total I/O In: 346.7 [I.V.:197.8; IV Piggyback:148.9] Out: 650 [Urine:650]  General appearance: alert, cooperative and mild distress Resp: Rhonchi, wheezing, thick upper respiratory breath sounds. GI: Continues to remain tender in the right upper quadrant.  He has a large left lower quadrant parastomal hernia there is no gas or stool in the bag currently.  It was changed yesterday.  Lab Results:  Recent Labs    05/10/18 0016 05/10/18 0539  WBC 20.8* 18.9*  HGB 7.8* 7.8*  HCT 25.6* 26.9*  PLT 137* 126*    BMET Recent Labs    05/09/18 0548 05/10/18 0539  NA 139 142  K 3.8 3.5  CL 101 105  CO2 24 23  GLUCOSE 134* 135*  BUN 25* 35*  CREATININE 1.58* 1.67*  CALCIUM 9.0 9.0   PT/INR Recent Labs    05/09/18 1152  LABPROT 16.9*  INR 1.39    Recent Labs  Lab  05/08/18 0328 05/09/18 0548 05/10/18 0539  AST 16 28 26   ALT 14 15 15   ALKPHOS 75 77 80  BILITOT 1.6* 1.3* 1.0  PROT 7.5 6.5 6.1*  ALBUMIN 3.6 2.8* 2.7*     Lipase     Component Value Date/Time   LIPASE 20 05/08/2018 0328     Medications: . amiodarone  200 mg Oral BID  . budesonide  0.25 mg Nebulization BID  . chlorhexidine  15 mL Mouth Rinse BID  . furosemide  80 mg Intravenous Q12H  . guaiFENesin  1,200 mg Oral BID  . ipratropium-albuterol  3 mL Nebulization Q6H  . mouth rinse  15 mL Mouth Rinse q12n4p  . methylPREDNISolone (SOLU-MEDROL) injection  40 mg Intravenous Daily  . pantoprazole (PROTONIX) IV  40 mg Intravenous Q24H   . sodium chloride 10 mL/hr at 05/10/18 0836  . ceFEPime (MAXIPIME) IV 2 g (05/10/18 1037)  . heparin Stopped (05/10/18 1032)  . metronidazole 500 mg (05/10/18 0956)   Anti-infectives (From admission, onward)   Start     Dose/Rate Route Frequency Ordered Stop   05/10/18 0600  vancomycin (VANCOCIN) 1,500 mg in sodium chloride 0.9 % 500 mL IVPB  Status:  Discontinued     1,500 mg 250 mL/hr over 120 Minutes Intravenous Every 48 hours 05/08/18 1240 05/10/18 0851   05/09/18 0630  ceFEPIme (MAXIPIME) 2 g in sodium chloride 0.9 % 100 mL IVPB     2  g 200 mL/hr over 30 Minutes Intravenous Every 12 hours 05/09/18 0209     05/08/18 2100  vancomycin (VANCOCIN) IVPB 1000 mg/200 mL premix  Status:  Discontinued     1,000 mg 200 mL/hr over 60 Minutes Intravenous  Once 05/08/18 2047 05/08/18 2057   05/08/18 1030  vancomycin (VANCOCIN) IVPB 1000 mg/200 mL premix     1,000 mg 200 mL/hr over 60 Minutes Intravenous  Once 05/08/18 0954 05/08/18 1151   05/08/18 0800  metroNIDAZOLE (FLAGYL) IVPB 500 mg     500 mg 100 mL/hr over 60 Minutes Intravenous Every 8 hours 05/08/18 0637     05/08/18 0700  ceFEPIme (MAXIPIME) 1 g in sodium chloride 0.9 % 100 mL IVPB  Status:  Discontinued     1 g 200 mL/hr over 30 Minutes Intravenous Every 8 hours 05/08/18 0637 05/09/18  0209   05/08/18 0615  vancomycin (VANCOCIN) IVPB 1000 mg/200 mL premix     1,000 mg 200 mL/hr over 60 Minutes Intravenous  Once 05/08/18 0603 05/08/18 0749   05/08/18 0615  piperacillin-tazobactam (ZOSYN) IVPB 3.375 g  Status:  Discontinued     3.375 g 100 mL/hr over 30 Minutes Intravenous  Once 05/08/18 0603 05/08/18 2130     Assessment/Plan COPD AF/on Eliquis - no dose in hospital Hx of CDHF/Hx CABG Type II diabetes Obesity BMI 36.8. Hx OSA Hypertension Hx colon resection/colostomy 11/2014, SBO 2009 Anemia/ mild thrombocytopenia  RUQ pain  - HIDA Scan positive  - IR evaluation for cholecystostomy requested Bilateral pneumonia with effusions  FEN:  IV fluids/NPO ID:  Maxipime 12/1 >> day 2  Flagyl 12/1 >> day 2 Vancomycin 12/1 - 2 doses infused DVT: Heparin stopped at 1032 this a.m. for procedure later Follow up: TBD  Plan: Continue antibiotics.  IR drain placement hopefully later today.      LOS: 2 days    Alfonse Garringer 05/10/2018 [phone]

## 2018-05-10 NOTE — Consult Note (Signed)
   [facility] CM Inpatient Consult   05/10/2018  Eric [name] Jun 12, 1926 [id number]     Patient screened for potential [facility] Care Management services due to unplanned readmission risk score of 23% (high).  Chart reviewed. Noted [name] is from SNF and likely to return. No identifiable [facility] Care Management needs at this time.   [name], MSN-Ed, RN,BSN [facility] Liaison [phone]

## 2018-05-10 NOTE — Plan of Care (Signed)
  Problem: Pain Managment: Goal: General experience of comfort will improve Outcome: Progressing   

## 2018-05-10 NOTE — Progress Notes (Signed)
SLP Cancellation Note  Patient Details Name: Eric [name] MRN: [id number] DOB: 25-Nov-1926   Cancelled treatment:       Reason Eval/Treat Not Completed: Other (comment);Medical issues which prohibited therapy  Pt to have a drain put in today per MD.  Will continue efforts.  RN reports pt is "rattling" and "gurgly" concerning for secretion aspiration.   Luanna Salk, MS [facility] SLP Acute Rehab Services Pager [phone] Office [phone]    Macario Golds 05/10/2018, 9:01 AM

## 2018-05-10 NOTE — Progress Notes (Signed)
ANTICOAGULATION CONSULT NOTE - Follow- [place] for a.fib Indication: atrial fibrillation  Allergies  Allergen Reactions  . Actos [Pioglitazone Hydrochloride] Other (See Comments)    "felt funny, drowsy, and weak":  [name] Buprenorphine Hcl Nausea And Vomiting  . Celebrex [Celecoxib] Other (See Comments)    "felt funny"  . Demerol Palpitations and Other (See Comments)    Increased BP  . Meperidine Palpitations    Other reaction(s): Other (See Comments) Increased BP  . Morphine And Related Nausea And Vomiting  . Ciprofloxacin Other (See Comments)    arthralgia  . Metformin Nausea And Vomiting  . Zocor [Simvastatin] Other (See Comments)    Makes pt very drowsy    Patient Measurements: Height: 5' 6"  (167.6 cm)(3 weeks ago per previous charting) Weight: 228 lb (103.4 kg) IBW/kg (Calculated) : 63.8 Heparin Dosing Weight: 86.8 kg   Vital Signs: Temp: 97.9 F (36.6 C) (12/03 0637) Temp Source: Oral (12/03 0637) BP: 138/56 (12/03 0637) Pulse Rate: 86 (12/03 0637)  Labs: Recent Labs    05/08/18 0328 05/09/18 0548 05/09/18 1152 05/09/18 1839 05/10/18 0016 05/10/18 0539 05/10/18 0824  HGB 10.3* 8.3* 8.0* 8.2* 7.8* 7.8*  --   HCT 33.4* 26.7* 26.5* 27.5* 25.6* 26.9*  --   PLT 161 167 140* 154 137* 126*  --   APTT  --   --  40*  --  74*  --  70*  LABPROT  --   --  16.9*  --   --   --   --   INR  --   --  1.39  --   --   --   --   HEPARINUNFRC  --   --  >2.20*  --   --   --   --   CREATININE 1.41* 1.58*  --   --   --  1.67*  --     Estimated Creatinine Clearance: 33.8 mL/min (A) (by C-G formula based on SCr of 1.67 mg/dL (H)).   Medical History: Past Medical History:  Diagnosis Date  . ALLERGIC RHINITIS   . ANEMIA-NOS   . AORTIC SCLEROSIS   . Asthma   . CARDIOMYOPATHY, ISCHEMIC   . CAROTID BRUIT, RIGHT 02/27/2008  . Cataract    surgery  . CML (chronic myeloid leukemia) ([place]) 06/26/2015  . COPD   . CORONARY ARTERY DISEASE    a. s/p CABG in 1995  b. DES in 2008, 2009, and most recent in 2012 with DES to SVG-OM  . DIABETES MELLITUS-TYPE II    diet controlled  . Diastolic dysfunction, Grade 1 11/24/2014  . Diverticulitis of colon with perforation 11/22/2014  . Diverticulosis   . GERD   . HIATAL HERNIA   . Hx of echocardiogram    Echo (9/15):  Mild LVH, EF 50-55%, no RWMA, Gr 1 DD, MAC, mild LAE.  [name] HYPERLIPIDEMIA   . HYPERTENSION   . Hyponatremia 11/22/2014  . IBS (irritable bowel syndrome)   . LACTOSE INTOLERANCE   . OA (osteoarthritis)   . OBESITY   . On home oxygen therapy    "3L all the time" (10/12/2016)  . Partial small bowel obstruction ([place])   . PERIPHERAL VASCULAR DISEASE   . Primary hyperparathyroidism ([place])    Lab Results Component Value Date  PTH 150.7* 02/13/2013  CALCIUM 11.0* 02/13/2013  CAION 1.21 03/15/2008    . Prostate cancer ([place])    seed implants 2004  . SICK SINUS/ TACHY-BRADY SYNDROME 09/2007   s/p PPM  [facility]  . Sleep apnea   . SMALL BOWEL OBSTRUCTION 04/18/2009   Qualifier: History of  By: Asa Lente MD, Jannifer Rodney Symptomatic diverticulosis 01/18/2009   Qualifier: Diagnosis of  By: Trellis Paganini PA-c, Amy S     Medications:  Scheduled:  . amiodarone  200 mg Oral BID  . budesonide  0.25 mg Nebulization BID  . chlorhexidine  15 mL Mouth Rinse BID  . furosemide  80 mg Intravenous Q12H  . guaiFENesin  1,200 mg Oral BID  . ipratropium-albuterol  3 mL Nebulization Q6H  . mouth rinse  15 mL Mouth Rinse q12n4p  . methylPREDNISolone (SOLU-MEDROL) injection  40 mg Intravenous Daily  . pantoprazole (PROTONIX) IV  40 mg Intravenous Q24H    Assessment: Pharmacy is consulted to dose heparin in 84 yo male with PMH of a.fib.  Pt had apixaban listed in nursing home med list, with last dose being 11/30 per med rec. Pt was originally placed on heparin 5000 units SQ q8h for DVT prophylaxis with last dose of SQ heparin being 0518 on 12/2.   Baseline labs, Today,  05/10/18:   APTT 70  With baseline HL elevated, will  dose aPTT until HL and aPTT start to correlate  Hgb 7.8  low but consistent with previous results  Plt 126  Scr 1.67 mg/dl, CrCl is 34 ml/min     Goal of Therapy:  APTT target is 66-103 seconds Monitor platelets by anticoagulation protocol: Yes   Plan:   Heparin stopped this AM due to scheduled percutaneous drain this PM   Will follow up on restart of anticoagulation    Daily CBC while on heparin infusion   Monitor for signs and symptoms of bleeding    Royetta Asal, PharmD, BCPS Pager [phone] 05/10/2018 10:48 AM

## 2018-05-11 ENCOUNTER — Inpatient Hospital Stay (HOSPITAL_COMMUNITY): Payer: Medicare Other

## 2018-05-11 DIAGNOSIS — J189 Pneumonia, unspecified organism: Secondary | ICD-10-CM

## 2018-05-11 LAB — COMPREHENSIVE METABOLIC PANEL
ALT: 20 U/L (ref 0–44)
AST: 33 U/L (ref 15–41)
Albumin: 2.8 g/dL — ABNORMAL LOW (ref 3.5–5.0)
Alkaline Phosphatase: 97 U/L (ref 38–126)
Anion gap: 13 (ref 5–15)
BUN: 51 mg/dL — ABNORMAL HIGH (ref 8–23)
CO2: 26 mmol/L (ref 22–32)
Calcium: 9.3 mg/dL (ref 8.9–10.3)
Chloride: 106 mmol/L (ref 98–111)
Creatinine, Ser: 1.75 mg/dL — ABNORMAL HIGH (ref 0.61–1.24)
GFR calc Af Amer: 39 mL/min — ABNORMAL LOW (ref >60)
GFR, EST NON AFRICAN AMERICAN: 34 mL/min — AB (ref >60)
Glucose, Bld: 191 mg/dL — ABNORMAL HIGH (ref 70–99)
POTASSIUM: 3.2 mmol/L — AB (ref 3.5–5.1)
Sodium: 145 mmol/L (ref 135–145)
Total Bilirubin: 1.1 mg/dL (ref 0.3–1.2)
Total Protein: 6.4 g/dL — ABNORMAL LOW (ref 6.5–8.1)

## 2018-05-11 LAB — CULTURE, RESPIRATORY W GRAM STAIN: Culture: NORMAL

## 2018-05-11 LAB — HEPARIN LEVEL (UNFRACTIONATED)
Heparin Unfractionated: 1.34 IU/mL — ABNORMAL HIGH (ref 0.30–0.70)
Heparin Unfractionated: 1.32 IU/mL — ABNORMAL HIGH (ref 0.30–0.70)

## 2018-05-11 LAB — CBC
HCT: 27.7 % — ABNORMAL LOW (ref 39.0–52.0)
Hemoglobin: 8.2 g/dL — ABNORMAL LOW (ref 13.0–17.0)
MCH: 30.4 pg (ref 26.0–34.0)
MCHC: 29.6 g/dL — ABNORMAL LOW (ref 30.0–36.0)
MCV: 102.6 fL — ABNORMAL HIGH (ref 80.0–100.0)
Platelets: 128 10*3/uL — ABNORMAL LOW (ref 150–400)
RBC: 2.7 MIL/uL — ABNORMAL LOW (ref 4.22–5.81)
RDW: 14.6 % (ref 11.5–15.5)
WBC: 15.2 10*3/uL — AB (ref 4.0–10.5)
nRBC: 0.1 % (ref 0.0–0.2)

## 2018-05-11 LAB — APTT
APTT: 67 s — AB (ref 24–36)
aPTT: 75 seconds — ABNORMAL HIGH (ref 24–36)

## 2018-05-11 LAB — BRAIN NATRIURETIC PEPTIDE: B Natriuretic Peptide: 651.1 pg/mL — ABNORMAL HIGH (ref 0.0–100.0)

## 2018-05-11 LAB — MAGNESIUM: Magnesium: 2.3 mg/dL (ref 1.7–2.4)

## 2018-05-11 MED ORDER — POTASSIUM CHLORIDE 10 MEQ/100ML IV SOLN
10.0000 meq | INTRAVENOUS | Status: AC
  Administered 2018-05-11 (×4): 10 meq via INTRAVENOUS
  Filled 2018-05-11 (×4): qty 100

## 2018-05-11 MED ORDER — FUROSEMIDE 10 MG/ML IJ SOLN
80.0000 mg | Freq: Two times a day (BID) | INTRAMUSCULAR | Status: DC
  Administered 2018-05-11 – 2018-05-12 (×3): 80 mg via INTRAVENOUS
  Filled 2018-05-11 (×3): qty 8

## 2018-05-11 MED ORDER — ACETAMINOPHEN 500 MG PO TABS
1000.0000 mg | ORAL_TABLET | Freq: Three times a day (TID) | ORAL | Status: DC
  Administered 2018-05-11 – 2018-05-19 (×22): 1000 mg via ORAL
  Filled 2018-05-11 (×21): qty 2

## 2018-05-11 MED ORDER — PREDNISONE 20 MG PO TABS: 40.0000 mg | ORAL_TABLET | Freq: Every day | ORAL | Status: DC

## 2018-05-11 MED ORDER — PREDNISONE 20 MG PO TABS
40.0000 mg | ORAL_TABLET | Freq: Every day | ORAL | Status: AC
  Administered 2018-05-12 – 2018-05-14 (×3): 40 mg via ORAL
  Filled 2018-05-11 (×3): qty 2

## 2018-05-11 MED ORDER — SIMETHICONE 40 MG/0.6ML PO SUSP
40.0000 mg | Freq: Four times a day (QID) | ORAL | Status: DC | PRN
  Administered 2018-05-11: 40 mg via ORAL
  Filled 2018-05-11: qty 0.6

## 2018-05-11 MED ORDER — METOPROLOL TARTRATE 25 MG PO TABS
12.5000 mg | ORAL_TABLET | Freq: Two times a day (BID) | ORAL | Status: DC
  Administered 2018-05-11 – 2018-05-19 (×16): 12.5 mg via ORAL
  Filled 2018-05-11 (×16): qty 1

## 2018-05-11 MED ORDER — HYDROMORPHONE HCL 1 MG/ML IJ SOLN
1.0000 mg | INTRAMUSCULAR | Status: DC | PRN
  Administered 2018-05-11 – 2018-05-12 (×5): 1 mg via INTRAVENOUS
  Filled 2018-05-11 (×6): qty 1

## 2018-05-11 NOTE — Plan of Care (Signed)
Plan of care reviewed and discussed with the patient. 

## 2018-05-11 NOTE — Progress Notes (Signed)
Referring Physician(s): Hoxworth,B  Supervising Physician: Daryll Brod  Patient Status:  [facility] - In-pt  Chief Complaint:  Cholecystitis, abdominal pain, cough/congestion  Subjective: Pt still with sig oral secretions/cough/ chest congestion; also c/o epigastric/lower chest discomfort; denies N/V   Allergies: Actos [pioglitazone hydrochloride]; Buprenorphine hcl; Celebrex [celecoxib]; Demerol; Meperidine; Morphine and related; Ciprofloxacin; Metformin; and Zocor [simvastatin]  Medications: Prior to Admission medications   Medication Sig Start Date End Date Taking? Authorizing Provider  amiodarone (PACERONE) 200 MG tablet Take 1 tablet (200 mg total) by mouth 2 (two) times daily. 04/20/18  Yes Buriev, Arie Sabina, MD  apixaban (ELIQUIS) 5 MG TABS tablet Take 2.5 mg by mouth 2 (two) times daily.   Yes [provider]  aspirin EC 81 MG EC tablet Take 1 tablet (81 mg total) by mouth daily. 04/21/18  Yes Buriev, Arie Sabina, MD  budesonide (PULMICORT) 0.25 MG/2ML nebulizer solution Take 2 mLs (0.25 mg total) by nebulization 2 (two) times daily. 12/06/17  Yes Lauraine Rinne, NP  diltiazem (CARDIZEM CD) 180 MG 24 hr capsule Take 1 capsule (180 mg total) by mouth daily. 04/21/18  Yes Buriev, Arie Sabina, MD  fluticasone (FLONASE) 50 MCG/ACT nasal spray Place 2 sprays into both nostrils daily. Allergies 08/01/14  Yes [name], MD  furosemide (LASIX) 40 MG tablet Take 1 tablet (40 mg total) by mouth 2 (two) times daily. 03/31/18  Yes Belva Crome, MD  HYDROcodone-acetaminophen (NORCO) 7.5-325 MG tablet Take 1 tablet by mouth every 6 (six) hours as needed for moderate pain or severe pain.   Yes [provider]  insulin aspart (NOVOLOG) 100 UNIT/ML injection Inject 0-5 Units into the skin at bedtime. Insulin sliding scale Patient taking differently: Inject 0-9 Units into the skin 3 (three) times daily with meals. Insulin sliding scale  101-150=1 units 151-200=2  units 201-250=3 units 251-300=5 units 301-350=7 units >350=9 units Call MD for BS>400 or <60 04/20/18  Yes Buriev, Arie Sabina, MD  ipratropium-albuterol (DUONEB) 0.5-2.5 (3) MG/3ML SOLN Take 3 mLs by nebulization 3 (three) times daily.  02/17/17  Yes [provider]  isosorbide mononitrate (IMDUR) 30 MG 24 hr tablet Take 0.5 tablets (15 mg total) by mouth daily. 04/21/18  Yes Buriev, Arie Sabina, MD  pravastatin (PRAVACHOL) 20 MG tablet Take 20 mg by mouth daily.    Yes [provider]  ranitidine (ZANTAC) 300 MG tablet Take 600 mg by mouth at bedtime.    Yes [provider]  senna (SENOKOT) 8.6 MG tablet Take 2 tablets by mouth daily.    Yes [provider]  acetaminophen (TYLENOL) 500 MG tablet Take 1,000 mg by mouth every 6 (six) hours as needed for mild pain or fever.    [provider]  apixaban (ELIQUIS) 2.5 MG TABS tablet Take 1 tablet (2.5 mg total) by mouth 2 (two) times daily. Patient not taking: Reported on 05/08/2018 04/20/18   Kinnie Feil, MD  glucose blood (ONE TOUCH ULTRA TEST) test strip USE TO CHECK BLOOD SUGARS ONCE A DAY 10/28/17   [name], MD  guaiFENesin-dextromethorphan (ROBITUSSIN DM) 100-10 MG/5ML syrup Take 5 mLs by mouth every 4 (four) hours as needed for cough. 10/15/16   Lavina Hamman, MD     Vital Signs: BP 102/72 (BP Location: Right Arm)   Pulse 100   Temp 97.8 F (36.6 C) (Oral)   Resp (!) 21   Ht 5\' 6"  (1.676 m) Comment: 3 weeks ago per previous charting  Wt 228 lb (103.4 kg)   SpO2 95%   BMI 36.80 kg/m   Physical Exam awake/alert; ill appearing; GB drain intact, insertion site ok, mild tenderness to palpation, drain irrigated without difficulty, output 245 cc yesterday, 35 cc today  Imaging: Nm Hepatobiliary Liver Func  Result Date: 05/09/2018 CLINICAL DATA:  Right upper quadrant abdominal pain. EXAM: NUCLEAR MEDICINE HEPATOBILIARY IMAGING TECHNIQUE: Sequential images of the abdomen were  obtained out to 60 minutes following intravenous administration of radiopharmaceutical. RADIOPHARMACEUTICALS:  4.9 mCi Tc-59m  Choletec IV COMPARISON:  CT scan and ultrasound of May 08, 2018. FINDINGS: Prompt uptake and biliary excretion of activity by the liver is seen. No filling of the gallbladder is noted. Morphine was not administered due to allergy. Biliary activity passes into small bowel, consistent with patent common bile duct. IMPRESSION: No filling of gallbladder is noted consistent with cystic duct obstruction and possible acute cholecystitis. Electronically Signed   By: Marijo Conception, M.D.   On: 05/09/2018 17:03   Ct Abdomen Pelvis W Contrast  Result Date: 05/08/2018 CLINICAL DATA:  Initial evaluation for acute right upper quadrant pain, cough and congestion. EXAM: CT ABDOMEN AND PELVIS WITH CONTRAST TECHNIQUE: Multidetector CT imaging of the abdomen and pelvis was performed using the standard protocol following bolus administration of intravenous contrast. CONTRAST:  138mL ISOVUE-300 IOPAMIDOL (ISOVUE-300) INJECTION 61% COMPARISON:  Prior CT from 12/07/2014. FINDINGS: Lower chest: Dense consolidative opacities present within the bilateral lung bases, right slightly worse than left. Superimposed peripheral pleural based calcifications noted. Associated bilateral pleural effusions, complex on the left with loculated appearance. Ill-defined density within the left effusion noted (series 2, image 10). Mild cardiomegaly. Cardiac pacemaker electrodes partially visualized. Hepatobiliary: Multiple scattered cysts seen within the liver, largest of which position within the left hepatic lobe in measures 4.4 cm, similar to previous. Liver demonstrates no acute finding. Gallbladder is enlarged and hydropic in appearance with hazy pericholecystic fat stranding, suspicious for possible acute cholecystitis. No radiopaque calculi identified. No biliary dilatation. Pancreas: Diffuse fatty infiltration of the  pancreas noted. Pancreas otherwise unremarkable without acute peripancreatic inflammation. Spleen: Spleen within normal limits. Adrenals/Urinary Tract: Adrenal glands are normal. Kidneys equal in size with symmetric enhancement. 3.4 cm simple left renal cyst noted. Additional scattered subcentimeter hypodensities too small the characterize, but statistically likely reflects small cysts as well. No nephrolithiasis or hydronephrosis. No focal enhancing renal mass. No hydroureter. Partially distended bladder within normal limits. Stomach/Bowel: Stomach within normal limits. No evidence for bowel obstruction. Patient is status post sigmoid colectomy with left lower quadrant colostomy in place. Large parastomal hernia containing fat and loops of small bowel present within the left lower quadrant without associated inflammation or obstruction. Neck of the hernia measures approximately 4.1 cm in diameter. Additional ventral hernia seen just to the right of midline at the paraumbilical region contains fat and a portion of the transverse colon (series 2, image 48). No associated complication. Vascular/Lymphatic: Advanced aorto bi-iliac atherosclerotic disease. No aneurysm. Mesenteric vessels are patent proximally. No adenopathy. Reproductive: Brachytherapy seeds overlie the prostate. Other: No free air or fluid. Musculoskeletal: Right total hip arthroplasty in place. No acute osseus abnormality. No discrete lytic or blastic osseous lesions. IMPRESSION: 1. Enlarged and hydropic gallbladder with hazy pericholecystic fat stranding, suspicious for acute cholecystitis. Correlation with laboratory values recommended. Additionally, further evaluation with dedicated right upper quadrant ultrasound could be performed for further evaluation as clinically warranted. 2. Dense consolidative opacities within the bilateral lung bases, right greater than left, concerning for infiltrates/pneumonia.  Associated bilateral pleural effusions,  complex in appearance on the left with increased internal density, raising the possibility for empyema. Please note that a malignant process could also have this appearance. Correlation with fluid analysis may be helpful for further evaluation as clinically warranted. 3. Large left lower quadrant parastomal hernia containing loops of small bowel without associated inflammation or obstruction. 4. Advanced aorto bi-iliac atherosclerotic disease. No aneurysm. Electronically Signed   By: Jeannine Boga M.D.   On: 05/08/2018 05:42   US Abdomen Limited  Result Date: 05/08/2018 CLINICAL DATA:  Initial evaluation for acute right upper quadrant pain. EXAM: ULTRASOUND ABDOMEN LIMITED RIGHT UPPER QUADRANT COMPARISON:  CT from same day. Please note that CT was performed following this examination, although this exam only became available for review after the CT was completed. FINDINGS: Gallbladder: No stones or sludge within the gallbladder lumen. Gallbladder wall measure within normal limits at 2.3 mm. No free pericholecystic fluid. A positive sonographic Murphy sign was elicited on exam. Common bile duct: Diameter: 5 mm Liver: Multiple scattered hepatic cysts noted, largest of which is septated and measures 4.4 cm in the left hepatic lobe. These are grossly stable from prior CTs. Within normal limits in parenchymal echogenicity. Portal vein is patent on color Doppler imaging with normal direction of blood flow towards the liver. IMPRESSION: 1. Positive Murphy sign without additional sonographic features for acute cholecystitis. No cholelithiasis. 2. No biliary dilatation. 3. Multiple scattered hepatic cysts, better evaluated on previous CTs. Electronically Signed   By: Jeannine Boga M.D.   On: 05/08/2018 06:42   Ir Perc Cholecystostomy  Result Date: 05/10/2018 CLINICAL DATA:  Acute cholecystitis and need for percutaneous cholecystostomy due to high risk currently for cholecystectomy. EXAM: PERCUTANEOUS  CHOLECYSTOSTOMY COMPARISON:  CT and ultrasound studies on 05/08/2018 ANESTHESIA/SEDATION: 0.5 mg IV Versed; 50 mcg IV Fentanyl. Total Moderate Sedation Time 17 minutes. The patient's level of consciousness and physiologic status were continuously monitored during the procedure by Radiology nursing. CONTRAST:  13 mL Isovue-300 MEDICATIONS: No additional medications. FLUOROSCOPY TIME:  1 minutes and 12 seconds.  50.9 mGy. PROCEDURE: The procedure, risks, benefits, and alternatives were explained to the patient's daughter. Questions regarding the procedure were encouraged and answered. The patient's daughter understands and consents to the procedure. A time-out was performed prior to initiating the procedure. The right abdominal wall was prepped with chlorhexidine in a sterile fashion, and a sterile drape was applied covering the operative field. A sterile gown and sterile gloves were used for the procedure. Local anesthesia was provided with 1% Lidocaine. Ultrasound image documentation was performed. Fluoroscopy during the procedure and fluoro spot radiograph confirms appropriate catheter position. Ultrasound was utilized to localize the gallbladder. Under direct ultrasound guidance, a 21 gauge needle was advanced via a transhepatic approach into the gallbladder lumen. Aspiration was performed and a bile sample sent for culture studies. A small amount of diluted contrast material was injected. A guide wire was then advanced into the gallbladder. A transitional dilator was placed. Percutaneous tract dilatation was then performed over a guide wire to 10-French. A 10-French pigtail drainage catheter was then advanced into the gallbladder lumen under fluoroscopy. Catheter was formed and injected with contrast material to confirm position. The catheter was flushed and connected to a gravity drainage bag. It was secured at the skin with a Prolene retention suture and Stat-Lock device. COMPLICATIONS: None FINDINGS: After  needle puncture of the gallbladder, a bile sample was aspirated and sent for culture. Transhepatic approach to cholecystostomy tube placement  was chosen through a short segment of the right lobe of the liver as there is colon very close to the gallbladder lumen just under the abdominal wall. The cholecystostomy tube was advanced into the gallbladder lumen and formed. It is now draining bile. This tube will be left to gravity drainage. IMPRESSION: Percutaneous cholecystostomy with placement of 10-French drainage catheter into the gallbladder lumen. This was left to gravity drainage. Electronically Signed   By: Aletta Edouard M.D.   On: 05/10/2018 17:07   Dg Chest Port 1 View  Result Date: 05/11/2018 CLINICAL DATA:  Acute respiratory failure with hypoxia. Coronary artery disease. Chronic myeloid leukemia. EXAM: PORTABLE CHEST 1 VIEW COMPARISON:  05/09/2018 FINDINGS: Stable cardiomegaly. Transvenous pacemaker remains in appropriate position. Prior CABG again noted. Symmetric bibasilar airspace disease and small bilateral pleural effusions show no significant change. IMPRESSION: No significant change in symmetric bibasilar airspace disease and small bilateral pleural effusions, likely due to congestive heart failure. Electronically Signed   By: Earle Gell M.D.   On: 05/11/2018 08:35   Dg Chest Port 1 View  Result Date: 05/09/2018 CLINICAL DATA:  Respiratory failure EXAM: PORTABLE CHEST 1 VIEW COMPARISON:  04/12/2018 FINDINGS: Cardiac shadow is stable. Postsurgical changes are again seen. Pacing device is again noted and stable. Increased vascular congestion with interstitial edema is noted. Chronic blunting of left costophrenic angle is noted. Left basilar scarring is again noted. IMPRESSION: Changes of congestive heart failure with edema. Chronic changes in the left base appear Electronically Signed   By: Inez Catalina M.D.   On: 05/09/2018 12:11    Labs:  CBC: Recent Labs    05/09/18 1839  05/10/18 0016 05/10/18 0539 05/11/18 0236  WBC 21.4* 20.8* 18.9* 15.2*  HGB 8.2* 7.8* 7.8* 8.2*  HCT 27.5*  23.4* 25.6* 26.9* 27.7*  PLT 154 137* 126* 128*    COAGS: Recent Labs    04/16/18 0209 04/17/18 0252 04/18/18 0227  05/09/18 1152 05/10/18 0016 05/10/18 0824 05/11/18 0236 05/11/18 1337  INR 1.29 1.36 1.31  --  1.39  --   --   --   --   APTT  --   --   --    < > 40* 74* 70* 67* 75*   < > = values in this interval not displayed.    BMP: Recent Labs    05/08/18 0328 05/09/18 0548 05/10/18 0539 05/11/18 0236  NA 134* 139 142 145  K 3.3* 3.8 3.5 3.2*  CL 96* 101 105 106  CO2 28 24 23 26   GLUCOSE 171* 134* 135* 191*  BUN 21 25* 35* 51*  CALCIUM 9.4 9.0 9.0 9.3  CREATININE 1.41* 1.58* 1.67* 1.75*  GFRNONAA 44* 38* 36* 34*  GFRAA 51* 44* 41* 39*    LIVER FUNCTION TESTS: Recent Labs    05/08/18 0328 05/09/18 0548 05/10/18 0539 05/11/18 0236  BILITOT 1.6* 1.3* 1.0 1.1  AST 16 28 26  33  ALT 14 15 15 20   ALKPHOS 75 77 80 97  PROT 7.5 6.5 6.1* 6.4*  ALBUMIN 3.6 2.8* 2.7* 2.8*    Assessment and Plan: Pt s/p perc cholecystostomy 12/3 for cholecystitis/poor surgical candidate; afebrile; WBC 15.2(18.9), hgb 8.2, K 3.2- replace;creat 1.75(1.67), t bili 1.1, bile cx pend; CXR today- no sig change bibas aspdz, small bil effusions, likely secondary to CHF; GB drain will need to remain in place for at least 4-6 weeks unless cholecystectomy done in interim, cont drain irrigation/output monitoring; additional plans as per primary team/CCS  Electronically Signed: D. Rowe Robert, PA-C 05/11/2018, 3:21 PM   I spent a total of 15 minutes at the the patient's bedside AND on the patient's hospital floor or unit, greater than 50% of which was counseling/coordinating care for gallbladder drain    Patient ID: Eric [name], male   DOB: August 01, 1926, 84 y.o.   MRN: [id number]

## 2018-05-11 NOTE — Progress Notes (Addendum)
ANTICOAGULATION CONSULT NOTE - Follow Up Consult  Pharmacy Consult for Heparin Indication: atrial fibrillation  Allergies  Allergen Reactions  . Actos [Pioglitazone Hydrochloride] Other (See Comments)    "felt funny, drowsy, and weak":  [name] Buprenorphine Hcl Nausea And Vomiting  . Celebrex [Celecoxib] Other (See Comments)    "felt funny"  . Demerol Palpitations and Other (See Comments)    Increased BP  . Meperidine Palpitations    Other reaction(s): Other (See Comments) Increased BP  . Morphine And Related Nausea And Vomiting  . Ciprofloxacin Other (See Comments)    arthralgia  . Metformin Nausea And Vomiting  . Zocor [Simvastatin] Other (See Comments)    Makes pt very drowsy    Patient Measurements: Height: 5\' 6"  (167.6 cm)(3 weeks ago per previous charting) Weight: 228 lb (103.4 kg) IBW/kg (Calculated) : 63.8 Heparin Dosing Weight:   Vital Signs: Temp: 97.8 F (36.6 C) (12/04 1233) Temp Source: Oral (12/04 1233) BP: 102/72 (12/04 1233) Pulse Rate: 100 (12/04 1233)  Labs: Recent Labs    05/09/18 0548  05/09/18 1152  05/10/18 0016 05/10/18 0539 05/10/18 0824 05/11/18 0236 05/11/18 1337  HGB 8.3*  --  8.0*   < > 7.8* 7.8*  --  8.2*  --   HCT 26.7*  --  26.5*   < > 25.6* 26.9*  --  27.7*  --   PLT 167  --  140*   < > 137* 126*  --  128*  --   APTT  --    < > 40*  --  74*  --  70* 67* 75*  LABPROT  --   --  16.9*  --   --   --   --   --   --   INR  --   --  1.39  --   --   --   --   --   --   HEPARINUNFRC  --   --  >2.20*  --   --   --   --  1.34* 1.32*  CREATININE 1.58*  --   --   --   --  1.67*  --  1.75*  --    < > = values in this interval not displayed.    Estimated Creatinine Clearance: 32.2 mL/min (A) (by C-G formula based on SCr of 1.75 mg/dL (H)).  Assessment: Patient with high heparin level but aPTT within goal of 66-102 after rate increased to 1400 units/hr.  PTT ordered with Heparin level until both correlate due to possible drug-lab interaction  between oral anticoagulant (rivaroxaban, edoxaban, or apixaban) and anti-Xa level (aka heparin level)  Hg 8.2, PLTC low at 128.    Goal of Therapy:  Heparin level 0.3-0.7 units/ml aPTT 66-102 seconds Monitor platelets by anticoagulation protocol: Yes   Plan:  continue heparin at 1400 units/hr Heparin level and aPTT and CBC daily while on heparin Hope to be able to resume apixaban once bowel fxn returns- clears ordered today, taking PO meds  Eudelia Bunch, Pharm.D [phone] 05/11/2018 3:01 PM

## 2018-05-11 NOTE — Progress Notes (Signed)
ANTICOAGULATION CONSULT NOTE - Follow Up Consult  Pharmacy Consult for Heparin Indication: atrial fibrillation  Allergies  Allergen Reactions  . Actos [Pioglitazone Hydrochloride] Other (See Comments)    "felt funny, drowsy, and weak":  [name] Buprenorphine Hcl Nausea And Vomiting  . Celebrex [Celecoxib] Other (See Comments)    "felt funny"  . Demerol Palpitations and Other (See Comments)    Increased BP  . Meperidine Palpitations    Other reaction(s): Other (See Comments) Increased BP  . Morphine And Related Nausea And Vomiting  . Ciprofloxacin Other (See Comments)    arthralgia  . Metformin Nausea And Vomiting  . Zocor [Simvastatin] Other (See Comments)    Makes pt very drowsy    Patient Measurements: Height: 5\' 6"  (167.6 cm)(3 weeks ago per previous charting) Weight: 228 lb (103.4 kg) IBW/kg (Calculated) : 63.8 Heparin Dosing Weight:   Vital Signs: Temp: 97.3 F (36.3 C) (12/03 2043) Temp Source: Oral (12/03 2043) BP: 108/61 (12/03 2043) Pulse Rate: 136 (12/03 2043)  Labs: Recent Labs    05/09/18 0548  05/09/18 1152  05/10/18 0016 05/10/18 0539 05/10/18 0824 05/11/18 0236  HGB 8.3*  --  8.0*   < > 7.8* 7.8*  --  8.2*  HCT 26.7*  --  26.5*   < > 25.6* 26.9*  --  27.7*  PLT 167  --  140*   < > 137* 126*  --  128*  APTT  --    < > 40*  --  74*  --  70* 67*  LABPROT  --   --  16.9*  --   --   --   --   --   INR  --   --  1.39  --   --   --   --   --   HEPARINUNFRC  --   --  >2.20*  --   --   --   --  1.34*  CREATININE 1.58*  --   --   --   --  1.67*  --  1.75*   < > = values in this interval not displayed.    Estimated Creatinine Clearance: 32.2 mL/min (A) (by C-G formula based on SCr of 1.75 mg/dL (H)).   Medications:  Infusions:  . sodium chloride 10 mL/hr at 05/10/18 0836  . cefTRIAXone (ROCEPHIN)  IV 2 g (05/10/18 2311)  . heparin 1,400 Units/hr (05/11/18 0449)  . metronidazole 500 mg (05/11/18 0259)    Assessment: Patient with high heparin level  but PTT at lower end of goal.  PTT ordered with Heparin level until both correlate due to possible drug-lab interaction between oral anticoagulant (rivaroxaban, edoxaban, or apixaban) and anti-Xa level (aka heparin level)  No heparin issues per RN.  Goal of Therapy:  Heparin level 0.3-0.7 units/ml aPTT 66-102 seconds Monitor platelets by anticoagulation protocol: Yes   Plan:  Increase heparin to 1400 units/hr Heparin level and PTT at [address] [place] 05/11/2018,4:49 AM

## 2018-05-11 NOTE — Progress Notes (Signed)
PROGRESS NOTE    Eric [name]  JEH:[id number] DOB: 11-27-1926 DOA: 05/08/2018 PCP: [name], MD   Brief Narrative:  Eric [name] is Macdonald Rigor 84 y.o. malewith medical history significant forcoronary artery disease and aortic sclerosis hypertension hyperlipidemia, diabetes, COPD, O2 dependent on 3 L at home, chronic respiratory failure on oxygen, ischemic cardiomyopathy, atrial fibrillation on Eliquis. Patient presented with right upper quadrant pain concerning for acute cholecystitis, but also found to have evidence of bilateral pneumonia and positive blood cultures for E. Coli.  Assessment & Plan:   Active Problems:   Type 2 diabetes mellitus, controlled ([facility])   Hypertensive heart disease with CHF ([facility])   COPD mixed type (HCC)   Obstructive sleep apnea   Chronic diastolic heart failure (HCC)   Obesity (BMI 30-39.9)   Hartmann's pouch of intestine (HCC)   Essential hypertension   Atrial fibrillation with RVR (H[facility])   Acute cholecystitis   Pneumonia   Pneumonia due to other specified infectious organisms  E. Coli Bacteremia  Acute Cholecystitis  RUQ Pain:  E coli bacteremia likely 2/2 acute cholecystitis.  CT scan, RUQ [place], HIDA concerning for cholecystitis.  Now s/p perc drain placement. - 2/2 blood cx from 12/1 with e. Coli sensitive to ceftriaxone, continue  - continue flagyl as well for cholecystitis - General surgery recommendations: ADAT, perc tube per IR - Percutaneous drain per IR - recommending GB drain to remain in place 4-6 weeks unless cholecystectomy done in interim  - schedule APAP for pain, continue dilaudid prn, follow closely  Bilateral pneumonia  Acute Hypoxic Respiratory Failure Seen on CT abdomen/pelvis from admission on 12/1 (of note, at that time, concern for possible empyema on L side) - MRSA PCR negative - His CXR 12/4, notable for small bilateral pleural effusions - Continue ceftriaxone/flagyl as noted above - given concern for empyema on  abdominal CT at presentation, will follow CT chest   COPD exacerbation Acute on chronic respiratory failure Complicating overall picture. One 3L oxygen at home. Currently up to 5L. Currently stable -Continue Duoneb - Will transition to PO steroids, he's not wheezing today, would wean these as tolerated  Acute on chronic systolic and diastolic HF (37/01/5884 EF mildly reduced to 45%-50%) - CXR consistent with this - Follow CT scan as noted above - Elevated BNP as well - Continue Lasix 80 mg IV BID - Daily weights and strict in and out  CKD stage III: baseline creatinine around 1.3 - 1.5.  Creatinine fluctuating here, up to 1.75 today.  Watch closely with diuresis as noted above  Leukocytosis Secondary to infection. Improving.  Atrial fibrillation with RVR - rate in 110's and 120's today - continue heparin gtt for now, consider resuming eliquis within next day or so - continue amiodarone (home dilt on hold) - start metoprolol 12.5 mg BID  Anemia Likely iron deficiency anemia with anemia of chronic disease as well (low iron and percent sat, normal ferritin, folate, b12) - Start iron when able  Thrombocytopenia: present since hospital day 2, follow closely  Essential hypertension - metop started as noted above -holding diltiazem for atrial fibrillation -Hold Imdur secondary to bacteremia to allow more room for blood pressure  Diabetes mellitus, type 2 -Continue SSI  Obesity Body mass index is 36.8 kg/m.  Hypokalemia: replace, follow  DVT prophylaxis: heparin gtt Code Status: DNR Family Communication: none at bedside Disposition Plan: pending further improvement in respiratory status, surgery recs, CT scan for possible empyema  Consultants:   Surgery  IR  Procedures:  Perc cholecystostomy 12/4 by IR  Antimicrobials:  Anti-infectives (From admission, onward)   Start     Dose/Rate Route Frequency Ordered Stop   05/10/18 2200  cefTRIAXone (ROCEPHIN) 2 g  in sodium chloride 0.9 % 100 mL IVPB     2 g 200 mL/hr over 30 Minutes Intravenous Every 24 hours 05/10/18 1417     05/10/18 0600  vancomycin (VANCOCIN) 1,500 mg in sodium chloride 0.9 % 500 mL IVPB  Status:  Discontinued     1,500 mg 250 mL/hr over 120 Minutes Intravenous Every 48 hours 05/08/18 1240 05/10/18 0851   05/09/18 0630  ceFEPIme (MAXIPIME) 2 g in sodium chloride 0.9 % 100 mL IVPB  Status:  Discontinued     2 g 200 mL/hr over 30 Minutes Intravenous Every 12 hours 05/09/18 0209 05/10/18 1417   05/08/18 2100  vancomycin (VANCOCIN) IVPB 1000 mg/200 mL premix  Status:  Discontinued     1,000 mg 200 mL/hr over 60 Minutes Intravenous  Once 05/08/18 2047 05/08/18 2057   05/08/18 1030  vancomycin (VANCOCIN) IVPB 1000 mg/200 mL premix     1,000 mg 200 mL/hr over 60 Minutes Intravenous  Once 05/08/18 0954 05/08/18 1151   05/08/18 0800  metroNIDAZOLE (FLAGYL) IVPB 500 mg     500 mg 100 mL/hr over 60 Minutes Intravenous Every 8 hours 05/08/18 0637     05/08/18 0700  ceFEPIme (MAXIPIME) 1 g in sodium chloride 0.9 % 100 mL IVPB  Status:  Discontinued     1 g 200 mL/hr over 30 Minutes Intravenous Every 8 hours 05/08/18 0637 05/09/18 0209   05/08/18 0615  vancomycin (VANCOCIN) IVPB 1000 mg/200 mL premix     1,000 mg 200 mL/hr over 60 Minutes Intravenous  Once 05/08/18 0603 05/08/18 0749   05/08/18 0615  piperacillin-tazobactam (ZOSYN) IVPB 3.375 g  Status:  Discontinued     3.375 g 100 mL/hr over 30 Minutes Intravenous  Once 05/08/18 0603 05/08/18 0637     Subjective: C/o abdominal pain Doesn't feel well  Objective: Vitals:   05/11/18 0513 05/11/18 0741 05/11/18 1233 05/11/18 1412  BP: (!) 141/87  102/72   Pulse: 100  100   Resp: (!) 21     Temp: (!) 97.5 F (36.4 C)  97.8 F (36.6 C)   TempSrc: Oral  Oral   SpO2: 99% 92% 96% 95%  Weight:      Height:        Intake/Output Summary (Last 24 hours) at 05/11/2018 1739 Last data filed at 05/11/2018 1605 Gross per 24 hour    Intake 1929.28 ml  Output 1890 ml  Net 39.28 ml   Filed Weights   05/08/18 2109 05/09/18 0043  Weight: 101.7 kg 103.4 kg    Examination:  General exam: Appears calm and uncomfortable Respiratory system: coarse breath sounds throughout Cardiovascular system: S1 & S2 heard, irregularly irregular, tachy.  Gastrointestinal system: Abdomen is nondistended, soft and TTP in RUQ.  Ostomy.  Perc tube. Central nervous system: Alert and oriented. No focal neurological deficits. Extremities: moving all extremities Skin: No rashes, lesions or ulcers Psychiatry: Judgement and insight appear normal. Mood & affect appropriate.     Data Reviewed: I have personally reviewed following labs and imaging studies  CBC: Recent Labs  Lab 05/09/18 0548 05/09/18 1152 05/09/18 1839 05/10/18 0016 05/10/18 0539 05/11/18 0236  WBC 23.1* 24.6* 21.4* 20.8* 18.9* 15.2*  NEUTROABS 17.3*  --   --   --   --   --  HGB 8.3* 8.0* 8.2* 7.8* 7.8* 8.2*  HCT 26.7* 26.5* 27.5*  23.4* 25.6* 26.9* 27.7*  MCV 100.8* 102.7* 100.7* 102.4* 106.3* 102.6*  PLT 167 140* 154 137* 126* 160*   Basic Metabolic Panel: Recent Labs  Lab 05/08/18 0328 05/09/18 0548 05/10/18 0539 05/11/18 0226 05/11/18 0236  NA 134* 139 142  --  145  K 3.3* 3.8 3.5  --  3.2*  CL 96* 101 105  --  106  CO2 28 24 23   --  26  GLUCOSE 171* 134* 135*  --  191*  BUN 21 25* 35*  --  51*  CREATININE 1.41* 1.58* 1.67*  --  1.75*  CALCIUM 9.4 9.0 9.0  --  9.3  MG 2.0  --   --  2.3  --   PHOS 2.4*  --   --   --   --    GFR: Estimated Creatinine Clearance: 32.2 mL/min (Lleyton Byers) (by C-G formula based on SCr of 1.75 mg/dL (H)). Liver Function Tests: Recent Labs  Lab 05/08/18 0328 05/09/18 0548 05/10/18 0539 05/11/18 0236  AST 16 28 26  33  ALT 14 15 15 20   ALKPHOS 75 77 80 97  BILITOT 1.6* 1.3* 1.0 1.1  PROT 7.5 6.5 6.1* 6.4*  ALBUMIN 3.6 2.8* 2.7* 2.8*   Recent Labs  Lab 05/08/18 0328  LIPASE 20   No results for input(s): AMMONIA in  the last 168 hours. Coagulation Profile: Recent Labs  Lab 05/09/18 1152  INR 1.39   Cardiac Enzymes: No results for input(s): CKTOTAL, CKMB, CKMBINDEX, TROPONINI in the last 168 hours. BNP (last 3 results) Recent Labs    10/19/17 1448  PROBNP 473   HbA1C: No results for input(s): HGBA1C in the last 72 hours. CBG: No results for input(s): GLUCAP in the last 168 hours. Lipid Profile: No results for input(s): CHOL, HDL, LDLCALC, TRIG, CHOLHDL, LDLDIRECT in the last 72 hours. Thyroid Function Tests: No results for input(s): TSH, T4TOTAL, FREET4, T3FREE, THYROIDAB in the last 72 hours. Anemia Panel: Recent Labs    05/09/18 1839  VITAMINB12 1,145*  FERRITIN 294  TIBC 182*  IRON 9*   Sepsis Labs: No results for input(s): PROCALCITON, LATICACIDVEN in the last 168 hours.  Recent Results (from the past 240 hour(s))  Blood culture (routine x 2)     Status: Abnormal   Collection Time: 05/08/18  6:09 AM  Result Value Ref Range Status   Specimen Description   Final    BLOOD RIGHT ARM Performed at [facility] [address], [place], [place] [address]    Special Requests   Final    BOTTLES DRAWN AEROBIC AND ANAEROBIC Blood Culture adequate volume Performed at [facility] [address], [place], [place] [address]    Culture  Setup Time   Final    GRAM NEGATIVE RODS IN BOTH AEROBIC AND ANAEROBIC BOTTLES CRITICAL VALUE NOTED.  VALUE IS CONSISTENT WITH PREVIOUSLY REPORTED AND CALLED VALUE.    Culture (Cordarrius Coad)  Final    ESCHERICHIA COLI SUSCEPTIBILITIES PERFORMED ON PREVIOUS CULTURE WITHIN THE LAST 5 DAYS. Performed at [facility], [place] [address], [place], [place] [address]    Report Status 05/10/2018 FINAL  Final  Blood culture (routine x 2)     Status: Abnormal   Collection Time: 05/08/18  6:16 AM  Result Value Ref Range Status   Specimen Description   Final    BLOOD LEFT HAND Performed at [facility], [address] [place], [place] [address]    Special Requests   Final    BOTTLES DRAWN AEROBIC AND ANAEROBIC Blood Culture adequate volume Performed at [facility] [address], [place], [place] [address]    Culture  Setup Time   Final    GRAM NEGATIVE RODS IN BOTH AEROBIC AND ANAEROBIC BOTTLES CRITICAL RESULT CALLED TO, READ BACK BY AND VERIFIED WITH: B.GREEN,PHARMD AT 0026 ON 05/09/18 BY G.MCADOO Performed at [facility], [place] [address], [place], [place] [address]    Culture ESCHERICHIA COLI (Solveig Fangman)  Final   Report Status 05/10/2018 FINAL  Final   Organism ID, Bacteria ESCHERICHIA COLI  Final      Susceptibility   Escherichia coli - MIC*    AMPICILLIN >=32 RESISTANT Resistant     CEFAZOLIN >=64 RESISTANT Resistant     CEFEPIME <=1 SENSITIVE Sensitive     CEFTAZIDIME <=1 SENSITIVE Sensitive     CEFTRIAXONE <=1 SENSITIVE Sensitive     CIPROFLOXACIN <=0.25 SENSITIVE Sensitive     GENTAMICIN <=1 SENSITIVE Sensitive     IMIPENEM <=0.25 SENSITIVE Sensitive     TRIMETH/SULFA >=320 RESISTANT Resistant     AMPICILLIN/SULBACTAM >=32 RESISTANT Resistant     PIP/TAZO 64 INTERMEDIATE Intermediate     Extended ESBL NEGATIVE Sensitive     * ESCHERICHIA COLI  Blood Culture ID Panel (Reflexed)     Status: Abnormal   Collection Time: 05/08/18  6:16 AM  Result Value Ref Range Status   Enterococcus species NOT DETECTED NOT DETECTED Final   Listeria monocytogenes NOT DETECTED NOT DETECTED Final   Staphylococcus species NOT DETECTED NOT DETECTED Final   Staphylococcus aureus (BCID) NOT DETECTED NOT DETECTED Final   Streptococcus species NOT DETECTED NOT DETECTED Final   Streptococcus agalactiae NOT DETECTED NOT DETECTED Final   Streptococcus pneumoniae NOT DETECTED NOT DETECTED Final   Streptococcus pyogenes NOT DETECTED NOT DETECTED Final   Acinetobacter baumannii NOT DETECTED NOT DETECTED Final   Enterobacteriaceae species DETECTED (Berel Najjar) NOT DETECTED Final    Comment:  Enterobacteriaceae represent Karon Heckendorn large family of gram-negative bacteria, not Suriah Peragine single organism. CRITICAL RESULT CALLED TO, READ BACK BY AND VERIFIED WITH: B.GREEN,PHARMD AT 0026 ON 05/09/18 BY G.MCADOO    Enterobacter cloacae complex NOT DETECTED NOT DETECTED Final   Escherichia coli DETECTED (Danice Dippolito) NOT DETECTED Final    Comment: CRITICAL RESULT CALLED TO, READ BACK BY AND VERIFIED WITH: B.GREEN,PHARMD AT 0026 ON 05/09/18 BY G.MCADOO    Klebsiella oxytoca NOT DETECTED NOT DETECTED Final   Klebsiella pneumoniae NOT DETECTED NOT DETECTED Final   Proteus species NOT DETECTED NOT DETECTED Final   Serratia marcescens NOT DETECTED NOT DETECTED Final   Carbapenem resistance NOT DETECTED NOT DETECTED Final   Haemophilus influenzae NOT DETECTED NOT DETECTED Final   Neisseria meningitidis NOT DETECTED NOT DETECTED Final   Pseudomonas aeruginosa NOT DETECTED NOT DETECTED Final   Candida albicans NOT DETECTED NOT DETECTED Final   Candida glabrata NOT DETECTED NOT DETECTED Final   Candida krusei NOT DETECTED NOT DETECTED Final   Candida parapsilosis NOT DETECTED NOT DETECTED Final   Candida tropicalis NOT DETECTED NOT DETECTED Final    Comment: Performed at [facility], [place] [address], [place], [place] [address]  Culture, sputum-assessment     Status: None   Collection Time: 05/09/18  2:09 PM  Result Value Ref Range Status   Specimen Description SPUTUM  Final   Special Requests NONE  Final   Sputum evaluation  Final    THIS SPECIMEN IS ACCEPTABLE FOR SPUTUM CULTURE Performed at [facility] [address], [place], [place] [address]    Report Status 05/09/2018 FINAL  Final  MRSA PCR Screening     Status: None   Collection Time: 05/09/18  2:09 PM  Result Value Ref Range Status   MRSA by PCR NEGATIVE NEGATIVE Final    Comment:        The GeneXpert MRSA Assay (FDA approved for NASAL specimens only), is one component of Elza Varricchio comprehensive MRSA colonization surveillance  program. It is not intended to diagnose MRSA infection nor to guide or monitor treatment for MRSA infections. Performed at [facility], [place] [address] [place], [place] [address]   Culture, respiratory     Status: None   Collection Time: 05/09/18  2:09 PM  Result Value Ref Range Status   Specimen Description   Final    SPUTUM Performed at [facility] [address] [place], [place] [address]    Special Requests   Final    NONE Reflexed from [id number] Performed at [facility], [place] [address], [place], [place] [address]    Gram Stain   Final    ABUNDANT WBC PRESENT, PREDOMINANTLY PMN RARE GRAM POSITIVE COCCI RARE GRAM NEGATIVE COCCOBACILLI    Culture   Final    RARE Consistent with normal respiratory flora. Performed at [facility], [place] [address] [place], [place] [address]    Report Status 05/11/2018 FINAL  Final  Aerobic/Anaerobic Culture (surgical/deep wound)     Status: None (Preliminary result)   Collection Time: 05/10/18  4:44 PM  Result Value Ref Range Status   Specimen Description   Final    GALL BLADDER Performed at [facility] [address] [place], [place] [address]    Special Requests NONE  Final   Gram Stain   Final    RARE WBC PRESENT, PREDOMINANTLY MONONUCLEAR NO ORGANISMS SEEN Performed at [facility], [place] [address] [place], [place] [address]    Culture PENDING  Incomplete   Report Status PENDING  Incomplete         Radiology Studies: Ir Perc Cholecystostomy  Result Date: 05/10/2018 CLINICAL DATA:  Acute cholecystitis and need for percutaneous cholecystostomy due to high risk currently for cholecystectomy. EXAM: PERCUTANEOUS CHOLECYSTOSTOMY COMPARISON:  CT and ultrasound studies on 05/08/2018 ANESTHESIA/SEDATION: 0.5 mg IV Versed; 50 mcg IV Fentanyl. Total Moderate Sedation Time 17 minutes. The patient's level of consciousness and physiologic status were  continuously monitored during the procedure by Radiology nursing. CONTRAST:  13 mL Isovue-300 MEDICATIONS: No additional medications. FLUOROSCOPY TIME:  1 minutes and 12 seconds.  50.9 mGy. PROCEDURE: The procedure, risks, benefits, and alternatives were explained to the patient's daughter. Questions regarding the procedure were encouraged and answered. The patient's daughter understands and consents to the procedure. Xandra Laramee time-out was performed prior to initiating the procedure. The right abdominal wall was prepped with chlorhexidine in Howie Rufus sterile fashion, and Maija Biggers sterile drape was applied covering the operative field. Cailie Bosshart sterile gown and sterile gloves were used for the procedure. Local anesthesia was provided with 1% Lidocaine. Ultrasound image documentation was performed. Fluoroscopy during the procedure and fluoro spot radiograph confirms appropriate catheter position. Ultrasound was utilized to localize the gallbladder. Under direct ultrasound guidance, Chaston Bradburn 21 gauge needle was advanced via Bless Lisenby transhepatic approach into the gallbladder lumen. Aspiration was performed and Keniya Schlotterbeck bile sample sent for culture studies.  Keeley Sussman small amount of diluted contrast material was injected. Jeaninne Lodico guide wire was then advanced into the gallbladder. Katriana Dortch transitional dilator was placed. Percutaneous tract dilatation was then performed over Lamyiah Crawshaw guide wire to 10-French. Toria Monte 10-French pigtail drainage catheter was then advanced into the gallbladder lumen under fluoroscopy. Catheter was formed and injected with contrast material to confirm position. The catheter was flushed and connected to Kodee Drury gravity drainage bag. It was secured at the skin with Malikah Lakey Prolene retention suture and Stat-Lock device. COMPLICATIONS: None FINDINGS: After needle puncture of the gallbladder, Riley Hallum bile sample was aspirated and sent for culture. Transhepatic approach to cholecystostomy tube placement was chosen through Keyondra Lagrand short segment of the right lobe of the liver as there is colon very close  to the gallbladder lumen just under the abdominal wall. The cholecystostomy tube was advanced into the gallbladder lumen and formed. It is now draining bile. This tube will be left to gravity drainage. IMPRESSION: Percutaneous cholecystostomy with placement of 10-French drainage catheter into the gallbladder lumen. This was left to gravity drainage. Electronically Signed   By: Aletta Edouard M.D.   On: 05/10/2018 17:07   Dg Chest Port 1 View  Result Date: 05/11/2018 CLINICAL DATA:  Acute respiratory failure with hypoxia. Coronary artery disease. Chronic myeloid leukemia. EXAM: PORTABLE CHEST 1 VIEW COMPARISON:  05/09/2018 FINDINGS: Stable cardiomegaly. Transvenous pacemaker remains in appropriate position. Prior CABG again noted. Symmetric bibasilar airspace disease and small bilateral pleural effusions show no significant change. IMPRESSION: No significant change in symmetric bibasilar airspace disease and small bilateral pleural effusions, likely due to congestive heart failure. Electronically Signed   By: Earle Gell M.D.   On: 05/11/2018 08:35        Scheduled Meds: . acetaminophen  1,000 mg Oral Q8H  . amiodarone  200 mg Oral BID  . budesonide  0.25 mg Nebulization BID  . chlorhexidine  15 mL Mouth Rinse BID  . furosemide  80 mg Intravenous BID  . guaiFENesin  1,200 mg Oral BID  . ipratropium-albuterol  3 mL Nebulization Q6H  . mouth rinse  15 mL Mouth Rinse q12n4p  . metoprolol tartrate  12.5 mg Oral BID  . pantoprazole (PROTONIX) IV  40 mg Intravenous Q24H  . [START ON 05/12/2018] predniSONE  40 mg Oral Q breakfast  . sodium chloride flush  5 mL Intracatheter Q12H   Continuous Infusions: . sodium chloride 10 mL/hr at 05/10/18 0836  . cefTRIAXone (ROCEPHIN)  IV Stopped (05/11/18 0700)  . heparin 1,400 Units/hr (05/11/18 1126)  . metronidazole 500 mg (05/11/18 1437)     LOS: 3 days    Time spent: over 71 min    Fayrene Helper, MD [facility] Pager  [phone]  If 7PM-7AM, please contact night-coverage [email or web address] Password [facility] 05/11/2018, 5:39 PM

## 2018-05-11 NOTE — Care Management Important Message (Signed)
Important Message  Patient Details  Name: Eric [name] MRN: [id number] Date of Birth: 07/18/1926   Medicare Important Message Given:  Yes    Kerin Salen 05/11/2018, 11:56 AMImportant Message  Patient Details  Name: Eric [name] MRN: [id number] Date of Birth: 07-04-1926   Medicare Important Message Given:  Yes    Kerin Salen 05/11/2018, 11:56 AM

## 2018-05-11 NOTE — Progress Notes (Addendum)
CC: Right upper quadrant pain  Subjective: He still looks miserable this a.m.  He continues to have a thick productive cough, pain with cough.  He says his side is better,but he is still very tender RUQ and flinches with any palpation of the site.    Objective: Vital signs in last 24 hours: Temp:  [97.2 F (36.2 C)-98.2 F (36.8 C)] 97.5 F (36.4 C) (12/04 0513) Pulse Rate:  [77-145] 100 (12/04 0513) Resp:  [18-27] 21 (12/04 0513) BP: (108-150)/(48-87) 141/87 (12/04 0513) SpO2:  [92 %-99 %] 92 % (12/04 0741) Last BM Date: (colostomy no output at this time) 60 PO 1500 IV 2060 urine Drain 245 Afebrile, VSS, Tachycardic Potassium 3.2, creatinine up to 1.75 LFTs are normal WBC down to 15.2 Hemoglobin 8.2/hematocrit 27.7, platelets 128,000  Intake/Output from previous day: 12/03 0701 - 12/04 0700 In: 1556.2 [P.O.:60; I.V.:442.3; IV Piggyback:1048.9] Out: 2305 [Urine:2060; Drains:245] Intake/Output this shift: Total I/O In: -  Out: 300 [Urine:300]  General appearance: alert, mild distress and ongoing pain, and congested breathing, he feels terrible still Resp: Thick course congested cough, pain with coughing, still requiring O2 GI: He says pain is better, but he is still pretty tender RUQ.  Ostomy with just a swipe of stool and some gas.  Does not look like he has had anything in the bag since it was changed.   Telem: AF with PVC's Lab Results:  Recent Labs    05/10/18 0539 05/11/18 0236  WBC 18.9* 15.2*  HGB 7.8* 8.2*  HCT 26.9* 27.7*  PLT 126* 128*    BMET Recent Labs    05/10/18 0539 05/11/18 0236  NA 142 145  K 3.5 3.2*  CL 105 106  CO2 23 26  GLUCOSE 135* 191*  BUN 35* 51*  CREATININE 1.67* 1.75*  CALCIUM 9.0 9.3   PT/INR Recent Labs    05/09/18 1152  LABPROT 16.9*  INR 1.39    Recent Labs  Lab 05/08/18 0328 05/09/18 0548 05/10/18 0539 05/11/18 0236  AST 16 28 26  33  ALT 14 15 15 20   ALKPHOS 75 77 80 97  BILITOT 1.6* 1.3* 1.0 1.1   PROT 7.5 6.5 6.1* 6.4*  ALBUMIN 3.6 2.8* 2.7* 2.8*     Lipase     Component Value Date/Time   LIPASE 20 05/08/2018 0328     Medications: . amiodarone  200 mg Oral BID  . budesonide  0.25 mg Nebulization BID  . chlorhexidine  15 mL Mouth Rinse BID  . furosemide  80 mg Intravenous Q12H  . guaiFENesin  1,200 mg Oral BID  . ipratropium-albuterol  3 mL Nebulization Q6H  . mouth rinse  15 mL Mouth Rinse q12n4p  . methylPREDNISolone (SOLU-MEDROL) injection  40 mg Intravenous Daily  . pantoprazole (PROTONIX) IV  40 mg Intravenous Q24H  . sodium chloride flush  5 mL Intracatheter Q12H   . sodium chloride 10 mL/hr at 05/10/18 0836  . cefTRIAXone (ROCEPHIN)  IV Stopped (05/11/18 0700)  . heparin 1,400 Units/hr (05/11/18 0449)  . metronidazole 500 mg (05/11/18 0259)  . potassium chloride 10 mEq (05/11/18 0933)   Anti-infectives (From admission, onward)   Start     Dose/Rate Route Frequency Ordered Stop   05/10/18 2200  cefTRIAXone (ROCEPHIN) 2 g in sodium chloride 0.9 % 100 mL IVPB     2 g 200 mL/hr over 30 Minutes Intravenous Every 24 hours 05/10/18 1417     05/10/18 0600  vancomycin (VANCOCIN) 1,500 mg in sodium  chloride 0.9 % 500 mL IVPB  Status:  Discontinued     1,500 mg 250 mL/hr over 120 Minutes Intravenous Every 48 hours 05/08/18 1240 05/10/18 0851   05/09/18 0630  ceFEPIme (MAXIPIME) 2 g in sodium chloride 0.9 % 100 mL IVPB  Status:  Discontinued     2 g 200 mL/hr over 30 Minutes Intravenous Every 12 hours 05/09/18 0209 05/10/18 1417   05/08/18 2100  vancomycin (VANCOCIN) IVPB 1000 mg/200 mL premix  Status:  Discontinued     1,000 mg 200 mL/hr over 60 Minutes Intravenous  Once 05/08/18 2047 05/08/18 2057   05/08/18 1030  vancomycin (VANCOCIN) IVPB 1000 mg/200 mL premix     1,000 mg 200 mL/hr over 60 Minutes Intravenous  Once 05/08/18 0954 05/08/18 1151   05/08/18 0800  metroNIDAZOLE (FLAGYL) IVPB 500 mg     500 mg 100 mL/hr over 60 Minutes Intravenous Every 8 hours  05/08/18 0637     05/08/18 0700  ceFEPIme (MAXIPIME) 1 g in sodium chloride 0.9 % 100 mL IVPB  Status:  Discontinued     1 g 200 mL/hr over 30 Minutes Intravenous Every 8 hours 05/08/18 0637 05/09/18 0209   05/08/18 0615  vancomycin (VANCOCIN) IVPB 1000 mg/200 mL premix     1,000 mg 200 mL/hr over 60 Minutes Intravenous  Once 05/08/18 0603 05/08/18 0749   05/08/18 0615  piperacillin-tazobactam (ZOSYN) IVPB 3.375 g  Status:  Discontinued     3.375 g 100 mL/hr over 30 Minutes Intravenous  Once 05/08/18 0603 05/08/18 4128     Assessment/Plan COPD AF/on Eliquis - no dose in hospital Hx of CDHF/Hx CABG Type II diabetes Obesity BMI 36.8. Hx OSA Hypertension Hx colon resection/colostomy 11/2014, SBO 2009 Anemia/ mild thrombocytopenia  RUQ pain  - HIDA Scan positive  - IR evaluation for cholecystostomy requested Bilateral pneumonia with effusions  FEN: IV fluids/clears =>> from our standpoint he can be advanced as tolerated ID: Maxipime 12/1 >>day 2 Flagyl 12/1 >>day 4 Vancomycin 12/1 - 2 doses infused DVT: Heparin stopped; can be resumed when OK with IR Follow up: TBD   Plan:  From our standpoint he is making progress.  I have talked to Dr. Florene Glen and he is going to see him next.   LOS: 3 days    Juston Goheen 05/11/2018 [phone]

## 2018-05-11 NOTE — Progress Notes (Signed)
   05/11/18 1545  Clinical Encounter Type  Visited With Patient  Visit Type Follow-up  Referral From Physician  Consult/Referral To [facility]  The chaplain responded to the spiritual care consult for Pt. prayer.  The chaplain heard the Pt.'s spiritual distress in his desire for forgiveness.  The chaplain was able to be pastorally present with the Pt. as he discerns God presence in his life with his hospitalization.  The Pt. accepted prayer with the chaplain. The chaplain will follow up with spiritual care as needed.

## 2018-05-12 DIAGNOSIS — E669 Obesity, unspecified: Secondary | ICD-10-CM

## 2018-05-12 DIAGNOSIS — R7881 Bacteremia: Secondary | ICD-10-CM

## 2018-05-12 LAB — HEPARIN LEVEL (UNFRACTIONATED)
HEPARIN UNFRACTIONATED: 0.89 [IU]/mL — AB (ref 0.30–0.70)
Heparin Unfractionated: 0.89 IU/mL — ABNORMAL HIGH (ref 0.30–0.70)

## 2018-05-12 LAB — COMPREHENSIVE METABOLIC PANEL
ALT: 19 U/L (ref 0–44)
AST: 16 U/L (ref 15–41)
Albumin: 2.9 g/dL — ABNORMAL LOW (ref 3.5–5.0)
Alkaline Phosphatase: 89 U/L (ref 38–126)
Anion gap: 9 (ref 5–15)
BUN: 63 mg/dL — ABNORMAL HIGH (ref 8–23)
CO2: 29 mmol/L (ref 22–32)
Calcium: 9.4 mg/dL (ref 8.9–10.3)
Chloride: 107 mmol/L (ref 98–111)
Creatinine, Ser: 1.79 mg/dL — ABNORMAL HIGH (ref 0.61–1.24)
GFR calc non Af Amer: 33 mL/min — ABNORMAL LOW (ref >60)
GFR, EST AFRICAN AMERICAN: 38 mL/min — AB (ref >60)
Glucose, Bld: 221 mg/dL — ABNORMAL HIGH (ref 70–99)
Potassium: 3.4 mmol/L — ABNORMAL LOW (ref 3.5–5.1)
SODIUM: 145 mmol/L (ref 135–145)
Total Bilirubin: 0.7 mg/dL (ref 0.3–1.2)
Total Protein: 6.8 g/dL (ref 6.5–8.1)

## 2018-05-12 LAB — CBC
HCT: 29.2 % — ABNORMAL LOW (ref 39.0–52.0)
Hemoglobin: 8.7 g/dL — ABNORMAL LOW (ref 13.0–17.0)
MCH: 30.6 pg (ref 26.0–34.0)
MCHC: 29.8 g/dL — ABNORMAL LOW (ref 30.0–36.0)
MCV: 102.8 fL — ABNORMAL HIGH (ref 80.0–100.0)
Platelets: 117 10*3/uL — ABNORMAL LOW (ref 150–400)
RBC: 2.84 MIL/uL — ABNORMAL LOW (ref 4.22–5.81)
RDW: 15 % (ref 11.5–15.5)
WBC: 9.4 10*3/uL (ref 4.0–10.5)
nRBC: 0.3 % — ABNORMAL HIGH (ref 0.0–0.2)

## 2018-05-12 LAB — MAGNESIUM: Magnesium: 2.6 mg/dL — ABNORMAL HIGH (ref 1.7–2.4)

## 2018-05-12 LAB — APTT
aPTT: 63 seconds — ABNORMAL HIGH (ref 24–36)
aPTT: 99 seconds — ABNORMAL HIGH (ref 24–36)

## 2018-05-12 MED ORDER — FUROSEMIDE 40 MG PO TABS
40.0000 mg | ORAL_TABLET | Freq: Two times a day (BID) | ORAL | Status: DC
  Administered 2018-05-13: 40 mg via ORAL
  Filled 2018-05-12: qty 1

## 2018-05-12 MED ORDER — INSULIN ASPART 100 UNIT/ML ~~LOC~~ SOLN
0.0000 [IU] | Freq: Three times a day (TID) | SUBCUTANEOUS | Status: DC
  Administered 2018-05-13: 2 [IU] via SUBCUTANEOUS
  Administered 2018-05-13: 3 [IU] via SUBCUTANEOUS
  Administered 2018-05-13: 1 [IU] via SUBCUTANEOUS
  Administered 2018-05-14: 2 [IU] via SUBCUTANEOUS
  Administered 2018-05-14: 1 [IU] via SUBCUTANEOUS
  Administered 2018-05-14: 2 [IU] via SUBCUTANEOUS
  Administered 2018-05-15: 1 [IU] via SUBCUTANEOUS
  Administered 2018-05-15 (×2): 2 [IU] via SUBCUTANEOUS
  Administered 2018-05-16: 1 [IU] via SUBCUTANEOUS
  Administered 2018-05-16: 3 [IU] via SUBCUTANEOUS
  Administered 2018-05-16: 1 [IU] via SUBCUTANEOUS
  Administered 2018-05-17 (×2): 2 [IU] via SUBCUTANEOUS
  Administered 2018-05-17 – 2018-05-19 (×6): 1 [IU] via SUBCUTANEOUS

## 2018-05-12 MED ORDER — PANTOPRAZOLE SODIUM 40 MG PO TBEC
40.0000 mg | DELAYED_RELEASE_TABLET | Freq: Every day | ORAL | Status: DC
  Administered 2018-05-12 – 2018-05-18 (×7): 40 mg via ORAL
  Filled 2018-05-12 (×7): qty 1

## 2018-05-12 MED ORDER — BISACODYL 10 MG RE SUPP
10.0000 mg | Freq: Once | RECTAL | Status: AC
  Administered 2018-05-12: 10 mg via RECTAL
  Filled 2018-05-12: qty 1

## 2018-05-12 NOTE — Progress Notes (Signed)
PROGRESS NOTE    Eric [name]  TKW:[id number] DOB: 03-31-1927 DOA: 05/08/2018 PCP: [name], MD   Brief Narrative:  Eric [name] is a 84 y.o. malewith medical history significant forcoronary artery disease and aortic sclerosis hypertension hyperlipidemia, diabetes, COPD, O2 dependent on 3 L at home, chronic respiratory failure on oxygen, ischemic cardiomyopathy, atrial fibrillation on Eliquis. Patient presented with right upper quadrant pain concerning for acute cholecystitis, but also found to have evidence of bilateral pneumonia and positive blood cultures for E. Coli. Pt admitted for further management.  Assessment & Plan:   Active Problems:   Type 2 diabetes mellitus, controlled ([place])   Hypertensive heart disease with CHF ([place])   COPD mixed type (HCC)   Obstructive sleep apnea   Chronic diastolic heart failure (HCC)   HCAP (healthcare-associated pneumonia)   Obesity (BMI 30-39.9)   Hartmann's pouch of intestine (HCC)   Essential hypertension   Atrial fibrillation with RVR ([facility])   Acute cholecystitis   Pneumonia   Pneumonia due to other specified infectious organisms  E. Coli Bacteremia  Acute Cholecystitis Currently afebrile, with resolving leukocytosis E coli bacteremia likely 2/2 acute cholecystitis.  CT scan, RUQ [place], HIDA concerning for cholecystitis.  Now s/p perc drain placement. - 2/2 blood cx from 12/1 with e. Coli sensitive to ceftriaxone, repeat BC on 12/319 - continue flagyl as well for cholecystitis - General surgery recommendations: perc tube per IR, advance diet - Percutaneous drain per IR - recommending GB drain to remain in place 4-6 weeks unless cholecystectomy done in interim  - schedule APAP for pain, continue dilaudid prn, follow closely  Bilateral pneumonia  Acute Hypoxic Respiratory Failure Seen on CT abdomen/pelvis from admission on 12/1 (of note, at that time, concern for possible empyema on L side) - MRSA PCR negative - His  CXR 12/4, notable for small bilateral pleural effusions - given concern for empyema on abdominal CT at presentation, will follow CT chest  CT chest done on 12/4 showed: Consolidation in the lower lobes bilaterally as well as right middle lobe most compatible with pneumonia - Continue ceftriaxone/flagyl as noted above, may need to upgrade to ?cefepime if no significant improvement  COPD exacerbation Acute on chronic respiratory failure On 3L oxygen at home -Continue Duoneb, steroids  Acute on chronic systolic and diastolic HF (92/09/2681 EF mildly reduced to 45%-50%) - CXR consistent with this - Follow CT scan as noted above - Elevated BNP as well - Continue home dose Lasix due to AKI on CKD - Daily weights and strict in and out  AKI on CKD stage III: baseline creatinine around 1.3 - 1.5 Cr increasing Reduce dose to lasix to home dose Daily BMP  Atrial fibrillation with RVR Rate controlled - continue heparin gtt for now, consider resuming eliquis once tolerating orally - continue amiodarone (home dilt on hold), metoprolol  Macrocytic anemia Likely iron deficiency anemia with anemia of chronic disease as well (low iron and percent sat, normal ferritin, folate, b12) Start iron when able  Thrombocytopenia Present since hospital day 2, follow closely  Essential hypertension Metoprolol started as noted above  Diabetes mellitus, type 2 -Continue SSI  Obesity Body mass index is 36.8 kg/m.  Hypokalemia: replace, follow  [facility] consulted    DVT prophylaxis: heparin gtt Code Status: DNR Family Communication: none at bedside Disposition Plan: pending further improvement in respiratory status, surgery and IR recs  Consultants:   Surgery  IR  Procedures:  Perc cholecystostomy 12/4 by IR  Antimicrobials:  Anti-infectives (From admission, onward)   Start     Dose/Rate Route Frequency Ordered Stop   05/10/18 2200  cefTRIAXone (ROCEPHIN) 2 g in sodium  chloride 0.9 % 100 mL IVPB     2 g 200 mL/hr over 30 Minutes Intravenous Every 24 hours 05/10/18 1417     05/10/18 0600  vancomycin (VANCOCIN) 1,500 mg in sodium chloride 0.9 % 500 mL IVPB  Status:  Discontinued     1,500 mg 250 mL/hr over 120 Minutes Intravenous Every 48 hours 05/08/18 1240 05/10/18 0851   05/09/18 0630  ceFEPIme (MAXIPIME) 2 g in sodium chloride 0.9 % 100 mL IVPB  Status:  Discontinued     2 g 200 mL/hr over 30 Minutes Intravenous Every 12 hours 05/09/18 0209 05/10/18 1417   05/08/18 2100  vancomycin (VANCOCIN) IVPB 1000 mg/200 mL premix  Status:  Discontinued     1,000 mg 200 mL/hr over 60 Minutes Intravenous  Once 05/08/18 2047 05/08/18 2057   05/08/18 1030  vancomycin (VANCOCIN) IVPB 1000 mg/200 mL premix     1,000 mg 200 mL/hr over 60 Minutes Intravenous  Once 05/08/18 0954 05/08/18 1151   05/08/18 0800  metroNIDAZOLE (FLAGYL) IVPB 500 mg     500 mg 100 mL/hr over 60 Minutes Intravenous Every 8 hours 05/08/18 0637     05/08/18 0700  ceFEPIme (MAXIPIME) 1 g in sodium chloride 0.9 % 100 mL IVPB  Status:  Discontinued     1 g 200 mL/hr over 30 Minutes Intravenous Every 8 hours 05/08/18 0637 05/09/18 0209   05/08/18 0615  vancomycin (VANCOCIN) IVPB 1000 mg/200 mL premix     1,000 mg 200 mL/hr over 60 Minutes Intravenous  Once 05/08/18 0603 05/08/18 0749   05/08/18 0615  piperacillin-tazobactam (ZOSYN) IVPB 3.375 g  Status:  Discontinued     3.375 g 100 mL/hr over 30 Minutes Intravenous  Once 05/08/18 0603 05/08/18 9242     Subjective: Reports he is not feeling well overall, cant pinpoint exactly whats bothering him. Still with significant congestion. RN called stating pt keeps yelling "he wants to die"  Objective: Vitals:   05/12/18 0908 05/12/18 0946 05/12/18 1221 05/12/18 1319  BP:  105/80 127/77   Pulse:  91 91   Resp:      Temp:      TempSrc:      SpO2: 99%  100% 99%  Weight:      Height:        Intake/Output Summary (Last 24 hours) at 05/12/2018  1722 Last data filed at 05/12/2018 0457 Gross per 24 hour  Intake 485 ml  Output 100 ml  Net 385 ml   Filed Weights   05/08/18 2109 05/09/18 0043 05/12/18 0500  Weight: 101.7 kg 103.4 kg 101.5 kg    Examination:  General: NAD  Cardiovascular: S1, S2 present  Respiratory: Coarse BS bilaterally  Abdomen: Soft, TTP on RUQ, nondistended, bowel sounds present, ostomy in place, perc drain with sanguinous fliud  Musculoskeletal: No bilateral pedal edema noted  Skin: Normal  Psychiatry: Fair mood        Data Reviewed: I have personally reviewed following labs and imaging studies  CBC: Recent Labs  Lab 05/09/18 0548  05/09/18 1839 05/10/18 0016 05/10/18 0539 05/11/18 0236 05/12/18 0554  WBC 23.1*   < > 21.4* 20.8* 18.9* 15.2* 9.4  NEUTROABS 17.3*  --   --   --   --   --   --   HGB 8.3*   < >  8.2* 7.8* 7.8* 8.2* 8.7*  HCT 26.7*   < > 27.5*  23.4* 25.6* 26.9* 27.7* 29.2*  MCV 100.8*   < > 100.7* 102.4* 106.3* 102.6* 102.8*  PLT 167   < > 154 137* 126* 128* 117*   < > = values in this interval not displayed.   Basic Metabolic Panel: Recent Labs  Lab 05/08/18 0328 05/09/18 0548 05/10/18 0539 05/11/18 0226 05/11/18 0236 05/12/18 0554  NA 134* 139 142  --  145 145  K 3.3* 3.8 3.5  --  3.2* 3.4*  CL 96* 101 105  --  106 107  CO2 28 24 23   --  26 29  GLUCOSE 171* 134* 135*  --  191* 221*  BUN 21 25* 35*  --  51* 63*  CREATININE 1.41* 1.58* 1.67*  --  1.75* 1.79*  CALCIUM 9.4 9.0 9.0  --  9.3 9.4  MG 2.0  --   --  2.3  --  2.6*  PHOS 2.4*  --   --   --   --   --    GFR: Estimated Creatinine Clearance: 31.2 mL/min (A) (by C-G formula based on SCr of 1.79 mg/dL (H)). Liver Function Tests: Recent Labs  Lab 05/08/18 0328 05/09/18 0548 05/10/18 0539 05/11/18 0236 05/12/18 0554  AST 16 28 26  33 16  ALT 14 15 15 20 19   ALKPHOS 75 77 80 97 89  BILITOT 1.6* 1.3* 1.0 1.1 0.7  PROT 7.5 6.5 6.1* 6.4* 6.8  ALBUMIN 3.6 2.8* 2.7* 2.8* 2.9*   Recent Labs  Lab  05/08/18 0328  LIPASE 20   No results for input(s): AMMONIA in the last 168 hours. Coagulation Profile: Recent Labs  Lab 05/09/18 1152  INR 1.39   Cardiac Enzymes: No results for input(s): CKTOTAL, CKMB, CKMBINDEX, TROPONINI in the last 168 hours. BNP (last 3 results) Recent Labs    10/19/17 1448  PROBNP 473   HbA1C: No results for input(s): HGBA1C in the last 72 hours. CBG: No results for input(s): GLUCAP in the last 168 hours. Lipid Profile: No results for input(s): CHOL, HDL, LDLCALC, TRIG, CHOLHDL, LDLDIRECT in the last 72 hours. Thyroid Function Tests: No results for input(s): TSH, T4TOTAL, FREET4, T3FREE, THYROIDAB in the last 72 hours. Anemia Panel: Recent Labs    05/09/18 1839  VITAMINB12 1,145*  FERRITIN 294  TIBC 182*  IRON 9*   Sepsis Labs: No results for input(s): PROCALCITON, LATICACIDVEN in the last 168 hours.  Recent Results (from the past 240 hour(s))  Blood culture (routine x 2)     Status: Abnormal   Collection Time: 05/08/18  6:09 AM  Result Value Ref Range Status   Specimen Description   Final    BLOOD RIGHT ARM Performed at [facility] [address] [place], [place] [address]    Special Requests   Final    BOTTLES DRAWN AEROBIC AND ANAEROBIC Blood Culture adequate volume Performed at [facility] [address], [place], [place] [address]    Culture  Setup Time   Final    GRAM NEGATIVE RODS IN BOTH AEROBIC AND ANAEROBIC BOTTLES CRITICAL VALUE NOTED.  VALUE IS CONSISTENT WITH PREVIOUSLY REPORTED AND CALLED VALUE.    Culture (A)  Final    ESCHERICHIA COLI SUSCEPTIBILITIES PERFORMED ON PREVIOUS CULTURE WITHIN THE LAST 5 DAYS. Performed at [facility], [place] [address] [place], [place] [address]    Report Status 05/10/2018 FINAL  Final  Blood culture (  routine x 2)     Status: Abnormal   Collection Time: 05/08/18  6:16 AM  Result Value Ref Range Status   Specimen Description   Final     BLOOD LEFT HAND Performed at [facility] [address] [place], [place] [address]    Special Requests   Final    BOTTLES DRAWN AEROBIC AND ANAEROBIC Blood Culture adequate volume Performed at [facility] [address] [place], [place] [address]    Culture  Setup Time   Final    GRAM NEGATIVE RODS IN BOTH AEROBIC AND ANAEROBIC BOTTLES CRITICAL RESULT CALLED TO, READ BACK BY AND VERIFIED WITH: B.GREEN,PHARMD AT 0026 ON 05/09/18 BY G.MCADOO Performed at [facility], [place] [address], [place], [place] [address]    Culture ESCHERICHIA COLI (A)  Final   Report Status 05/10/2018 FINAL  Final   Organism ID, Bacteria ESCHERICHIA COLI  Final      Susceptibility   Escherichia coli - MIC*    AMPICILLIN >=32 RESISTANT Resistant     CEFAZOLIN >=64 RESISTANT Resistant     CEFEPIME <=1 SENSITIVE Sensitive     CEFTAZIDIME <=1 SENSITIVE Sensitive     CEFTRIAXONE <=1 SENSITIVE Sensitive     CIPROFLOXACIN <=0.25 SENSITIVE Sensitive     GENTAMICIN <=1 SENSITIVE Sensitive     IMIPENEM <=0.25 SENSITIVE Sensitive     TRIMETH/SULFA >=320 RESISTANT Resistant     AMPICILLIN/SULBACTAM >=32 RESISTANT Resistant     PIP/TAZO 64 INTERMEDIATE Intermediate     Extended ESBL NEGATIVE Sensitive     * ESCHERICHIA COLI  Blood Culture ID Panel (Reflexed)     Status: Abnormal   Collection Time: 05/08/18  6:16 AM  Result Value Ref Range Status   Enterococcus species NOT DETECTED NOT DETECTED Final   Listeria monocytogenes NOT DETECTED NOT DETECTED Final   Staphylococcus species NOT DETECTED NOT DETECTED Final   Staphylococcus aureus (BCID) NOT DETECTED NOT DETECTED Final   Streptococcus species NOT DETECTED NOT DETECTED Final   Streptococcus agalactiae NOT DETECTED NOT DETECTED Final   Streptococcus pneumoniae NOT DETECTED NOT DETECTED Final   Streptococcus pyogenes NOT DETECTED NOT DETECTED Final   Acinetobacter baumannii NOT DETECTED NOT DETECTED Final    Enterobacteriaceae species DETECTED (A) NOT DETECTED Final    Comment: Enterobacteriaceae represent a large family of gram-negative bacteria, not a single organism. CRITICAL RESULT CALLED TO, READ BACK BY AND VERIFIED WITH: B.GREEN,PHARMD AT 0026 ON 05/09/18 BY G.MCADOO    Enterobacter cloacae complex NOT DETECTED NOT DETECTED Final   Escherichia coli DETECTED (A) NOT DETECTED Final    Comment: CRITICAL RESULT CALLED TO, READ BACK BY AND VERIFIED WITH: B.GREEN,PHARMD AT 0026 ON 05/09/18 BY G.MCADOO    Klebsiella oxytoca NOT DETECTED NOT DETECTED Final   Klebsiella pneumoniae NOT DETECTED NOT DETECTED Final   Proteus species NOT DETECTED NOT DETECTED Final   Serratia marcescens NOT DETECTED NOT DETECTED Final   Carbapenem resistance NOT DETECTED NOT DETECTED Final   Haemophilus influenzae NOT DETECTED NOT DETECTED Final   Neisseria meningitidis NOT DETECTED NOT DETECTED Final   Pseudomonas aeruginosa NOT DETECTED NOT DETECTED Final   Candida albicans NOT DETECTED NOT DETECTED Final   Candida glabrata NOT DETECTED NOT DETECTED Final   Candida krusei NOT DETECTED NOT DETECTED Final   Candida parapsilosis NOT DETECTED NOT DETECTED Final   Candida tropicalis NOT DETECTED NOT DETECTED Final    Comment: Performed at [facility], [place],  [place] [address]  Culture, sputum-assessment     Status: None   Collection Time: 05/09/18  2:09 PM  Result Value Ref Range Status   Specimen Description SPUTUM  Final   Special Requests NONE  Final   Sputum evaluation   Final    THIS SPECIMEN IS ACCEPTABLE FOR SPUTUM CULTURE Performed at [facility], [place] [address] [place], [place] [address]    Report Status 05/09/2018 FINAL  Final  MRSA PCR Screening     Status: None   Collection Time: 05/09/18  2:09 PM  Result Value Ref Range Status   MRSA by PCR NEGATIVE NEGATIVE Final    Comment:        The GeneXpert MRSA Assay (FDA approved for NASAL specimens only),  is one component of a comprehensive MRSA colonization surveillance program. It is not intended to diagnose MRSA infection nor to guide or monitor treatment for MRSA infections. Performed at [facility], [place] [address], [place], [place] [address]   Culture, respiratory     Status: None   Collection Time: 05/09/18  2:09 PM  Result Value Ref Range Status   Specimen Description   Final    SPUTUM Performed at [facility] [address] [place], [place] [address]    Special Requests   Final    NONE Reflexed from [id number] Performed at [facility], [place] [address] [place], [place] [address]    Gram Stain   Final    ABUNDANT WBC PRESENT, PREDOMINANTLY PMN RARE GRAM POSITIVE COCCI RARE GRAM NEGATIVE COCCOBACILLI    Culture   Final    RARE Consistent with normal respiratory flora. Performed at [facility], [place] [address], [place], [place] [address]    Report Status 05/11/2018 FINAL  Final  Aerobic/Anaerobic Culture (surgical/deep wound)     Status: None (Preliminary result)   Collection Time: 05/10/18  4:44 PM  Result Value Ref Range Status   Specimen Description   Final    GALL BLADDER Performed at [facility] [address], [place], [place] [address]    Special Requests NONE  Final   Gram Stain   Final    RARE WBC PRESENT, PREDOMINANTLY MONONUCLEAR NO ORGANISMS SEEN    Culture   Final    NO GROWTH 1 DAY Performed at [facility], [place] [address] [place], [place] [address]    Report Status PENDING  Incomplete         Radiology Studies: Ct Chest Wo Contrast  Result Date: 05/11/2018 CLINICAL DATA:  Concern for empyema. Chest pain, shortness of breath. Pleural effusions suspected. EXAM: CT CHEST WITHOUT CONTRAST TECHNIQUE: Multidetector CT imaging of the chest was performed following the standard protocol without IV contrast. COMPARISON:  Chest x-ray 05/11/2018.  Chest CT  06/03/2016. FINDINGS: Cardiovascular: Prior CABG. Cardiomegaly. Diffusely calcified aorta. No aneurysm. Left pacer in place with leads in the right atrium and right ventricle. Mediastinum/Nodes: Borderline sized scattered mediastinal lymph nodes, likely reactive. No axillary or visible hilar adenopathy. Lungs/Pleura: Mild emphysema. Airspace consolidation in both lower lobes as well as the right middle lobe concerning for pneumonia. Small bilateral pleural effusions. Upper Abdomen: Stable low-density lesion in the left hepatic lobe, likely cyst. Musculoskeletal: Chest wall soft tissues are unremarkable. Pacer battery pack in the left chest wall. IMPRESSION: Consolidation in the lower lobes bilaterally as well as right middle lobe most compatible with pneumonia. Small bilateral pleural effusions. Cardiomegaly.  Prior CABG. Aortic  Atherosclerosis (ICD10-I70.0) and Emphysema (ICD10-J43.9). Electronically Signed   By: Rolm Baptise M.D.   On: 05/11/2018 22:30   Dg Chest Port 1 View  Result Date: 05/11/2018 CLINICAL DATA:  Acute respiratory failure with hypoxia. Coronary artery disease. Chronic myeloid leukemia. EXAM: PORTABLE CHEST 1 VIEW COMPARISON:  05/09/2018 FINDINGS: Stable cardiomegaly. Transvenous pacemaker remains in appropriate position. Prior CABG again noted. Symmetric bibasilar airspace disease and small bilateral pleural effusions show no significant change. IMPRESSION: No significant change in symmetric bibasilar airspace disease and small bilateral pleural effusions, likely due to congestive heart failure. Electronically Signed   By: Earle Gell M.D.   On: 05/11/2018 08:35        Scheduled Meds: . acetaminophen  1,000 mg Oral Q8H  . amiodarone  200 mg Oral BID  . budesonide  0.25 mg Nebulization BID  . chlorhexidine  15 mL Mouth Rinse BID  . furosemide  80 mg Intravenous BID  . guaiFENesin  1,200 mg Oral BID  . ipratropium-albuterol  3 mL Nebulization Q6H  . mouth rinse  15 mL Mouth  Rinse q12n4p  . metoprolol tartrate  12.5 mg Oral BID  . pantoprazole  40 mg Oral QHS  . predniSONE  40 mg Oral Q breakfast  . sodium chloride flush  5 mL Intracatheter Q12H   Continuous Infusions: . sodium chloride 10 mL/hr at 05/10/18 0836  . cefTRIAXone (ROCEPHIN)  IV 2 g (05/11/18 2109)  . heparin 1,500 Units/hr (05/12/18 0708)  . metronidazole 500 mg (05/12/18 1714)     LOS: 4 days     Alma Friendly, MD [facility]  If 7PM-7AM, please contact night-coverage 05/12/2018, 5:22 PM

## 2018-05-12 NOTE — Progress Notes (Signed)
CC: Abdominal pain and pneumonia  Subjective: Patient's abdominal pain is markedly better.  He is got a little bit of gas and some liquid like stool coming from his ostomy but has not had any real stool since they changed the bag on Monday.  He continues to have a thick productive cough although he sounds better than he did 2 days ago.  He is in there crying saying he wishes he were dead.  He says he has no one to take care of him.  IR drain is working well.  Objective: Vital signs in last 24 hours: Temp:  [97.6 F (36.4 C)-97.8 F (36.6 C)] 97.6 F (36.4 C) (12/05 0521) Pulse Rate:  [90-118] 91 (12/05 0946) Resp:  [30] 30 (12/05 0521) BP: (102-131)/(60-94) 105/80 (12/05 0946) SpO2:  [95 %-100 %] 99 % (12/05 0908) Weight:  [101.5 kg] 101.5 kg (12/05 0500) Last BM Date: (states days) 1180 PO 700 IV 825 urine  Drain 160 No Stool recorded Afebrile vital signs are stable WBC is down to 9.4, hemoglobin hematocrit are stable. CMP shows his creatinine is stable, potassium is 3.4, LFTs are normal.  Intake/Output from previous day: 12/04 0701 - 12/05 0700 In: 1951.2 [P.O.:1180; I.V.:261.2; IV Piggyback:500] Out: 985 [Urine:825; Drains:160] Intake/Output this shift: No intake/output data recorded.  General appearance: alert, cooperative and no distress Resp: Continues to have congested upper respiratory breath sounds.  Coughing, productive sputum.  Much better than 2 days ago. GI: He has minimal discomfort in the right upper quadrant markedly improved since the drain placement.  Had no real ostomy output since the bag was changed 2 days ago.  There is a little bit liquid and gas in the bag.  Lab Results:  Recent Labs    05/11/18 0236 05/12/18 0554  WBC 15.2* 9.4  HGB 8.2* 8.7*  HCT 27.7* 29.2*  PLT 128* 117*    BMET Recent Labs    05/11/18 0236 05/12/18 0554  NA 145 145  K 3.2* 3.4*  CL 106 107  CO2 26 29  GLUCOSE 191* 221*  BUN 51* 63*  CREATININE 1.75* 1.79*   CALCIUM 9.3 9.4   PT/INR Recent Labs    05/09/18 1152  LABPROT 16.9*  INR 1.39    Recent Labs  Lab 05/08/18 0328 05/09/18 0548 05/10/18 0539 05/11/18 0236 05/12/18 0554  AST 16 28 26  33 16  ALT 14 15 15 20 19   ALKPHOS 75 77 80 97 89  BILITOT 1.6* 1.3* 1.0 1.1 0.7  PROT 7.5 6.5 6.1* 6.4* 6.8  ALBUMIN 3.6 2.8* 2.7* 2.8* 2.9*     Lipase     Component Value Date/Time   LIPASE 20 05/08/2018 0328     Medications: . acetaminophen  1,000 mg Oral Q8H  . amiodarone  200 mg Oral BID  . budesonide  0.25 mg Nebulization BID  . chlorhexidine  15 mL Mouth Rinse BID  . furosemide  80 mg Intravenous BID  . guaiFENesin  1,200 mg Oral BID  . ipratropium-albuterol  3 mL Nebulization Q6H  . mouth rinse  15 mL Mouth Rinse q12n4p  . metoprolol tartrate  12.5 mg Oral BID  . pantoprazole  40 mg Oral QHS  . predniSONE  40 mg Oral Q breakfast  . sodium chloride flush  5 mL Intracatheter Q12H   . sodium chloride 10 mL/hr at 05/10/18 0836  . cefTRIAXone (ROCEPHIN)  IV 2 g (05/11/18 2109)  . heparin 1,500 Units/hr (05/12/18 0708)  . metronidazole 500  mg (05/12/18 0944)   Anti-infectives (From admission, onward)   Start     Dose/Rate Route Frequency Ordered Stop   05/10/18 2200  cefTRIAXone (ROCEPHIN) 2 g in sodium chloride 0.9 % 100 mL IVPB     2 g 200 mL/hr over 30 Minutes Intravenous Every 24 hours 05/10/18 1417     05/10/18 0600  vancomycin (VANCOCIN) 1,500 mg in sodium chloride 0.9 % 500 mL IVPB  Status:  Discontinued     1,500 mg 250 mL/hr over 120 Minutes Intravenous Every 48 hours 05/08/18 1240 05/10/18 0851   05/09/18 0630  ceFEPIme (MAXIPIME) 2 g in sodium chloride 0.9 % 100 mL IVPB  Status:  Discontinued     2 g 200 mL/hr over 30 Minutes Intravenous Every 12 hours 05/09/18 0209 05/10/18 1417   05/08/18 2100  vancomycin (VANCOCIN) IVPB 1000 mg/200 mL premix  Status:  Discontinued     1,000 mg 200 mL/hr over 60 Minutes Intravenous  Once 05/08/18 2047 05/08/18 2057    05/08/18 1030  vancomycin (VANCOCIN) IVPB 1000 mg/200 mL premix     1,000 mg 200 mL/hr over 60 Minutes Intravenous  Once 05/08/18 0954 05/08/18 1151   05/08/18 0800  metroNIDAZOLE (FLAGYL) IVPB 500 mg     500 mg 100 mL/hr over 60 Minutes Intravenous Every 8 hours 05/08/18 0637     05/08/18 0700  ceFEPIme (MAXIPIME) 1 g in sodium chloride 0.9 % 100 mL IVPB  Status:  Discontinued     1 g 200 mL/hr over 30 Minutes Intravenous Every 8 hours 05/08/18 0637 05/09/18 0209   05/08/18 0615  vancomycin (VANCOCIN) IVPB 1000 mg/200 mL premix     1,000 mg 200 mL/hr over 60 Minutes Intravenous  Once 05/08/18 0603 05/08/18 0749   05/08/18 0615  piperacillin-tazobactam (ZOSYN) IVPB 3.375 g  Status:  Discontinued     3.375 g 100 mL/hr over 30 Minutes Intravenous  Once 05/08/18 0603 05/08/18 8938     Assessment/Plan  COPD AF/on Eliquis - no dose in hospital Hx of CDHF/Hx CABG Type II diabetes Obesity BMI 36.8. Hx OSA Hypertension Hx colon resection/colostomy 11/2014, SBO 2009 Anemia/mild thrombocytopenia  RUQ pain - HIDAScan positive - IR evaluation forcholecystostomy requested Bilateral pneumonia with effusions  FEN: IV fluids/clears =>> from our standpoint he can be advanced as tolerated, he says he is not hungry and is           not trying to eat very much. ID: Maxipime 12/1 >>day 2 Flagyl 12/1 >>day 4 Vancomycin 12/1 - 12/3  Rocephin 12/3 =>> day 3 DVT: Heparindrip Follow up: TBD  Plan: From our standpoint I would try and advance his diet.  I will give him a Dulcolax suppository to place in the ostomy and see if this helps.  Now he is feeling rather hopeless.  I will get PT and OT evaluations to see if we can get him mobilized some.  From our standpoint he can follow-up in about 6 weeks with our office and IR.  LOS: 4 days    Lynx Goodrich 05/12/2018 [id number]

## 2018-05-12 NOTE — Evaluation (Signed)
Clinical/Bedside Swallow Evaluation Patient Details  Name: Eric [name] MRN: [id number] Date of Birth: Apr 11, 1927  Today's Date: 05/12/2018 Time: SLP Start Time (ACUTE ONLY): 0830 SLP Stop Time (ACUTE ONLY): 0843 SLP Time Calculation (min) (ACUTE ONLY): 13 min  Past Medical History:  Past Medical History:  Diagnosis Date  . ALLERGIC RHINITIS   . ANEMIA-NOS   . AORTIC SCLEROSIS   . Asthma   . CARDIOMYOPATHY, ISCHEMIC   . CAROTID BRUIT, RIGHT 02/27/2008  . Cataract    surgery  . CML (chronic myeloid leukemia) ([place]) 06/26/2015  . COPD   . CORONARY ARTERY DISEASE    a. s/p CABG in 1995 b. DES in 2008, 2009, and most recent in 2012 with DES to SVG-OM  . DIABETES MELLITUS-TYPE II    diet controlled  . Diastolic dysfunction, Grade 1 11/24/2014  . Diverticulitis of colon with perforation 11/22/2014  . Diverticulosis   . GERD   . HIATAL HERNIA   . Hx of echocardiogram    Echo (9/15):  Mild LVH, EF 50-55%, no RWMA, Gr 1 DD, MAC, mild LAE.  [name] HYPERLIPIDEMIA   . HYPERTENSION   . Hyponatremia 11/22/2014  . IBS (irritable bowel syndrome)   . LACTOSE INTOLERANCE   . OA (osteoarthritis)   . OBESITY   . On home oxygen therapy    "3L all the time" (10/12/2016)  . Partial small bowel obstruction ([place])   . PERIPHERAL VASCULAR DISEASE   . Primary hyperparathyroidism ([place])    Lab Results Component Value Date  PTH 150.7* 02/13/2013  CALCIUM 11.0* 02/13/2013  CAION 1.21 03/15/2008    . Prostate cancer ([place])    seed implants 2004  . SICK SINUS/ TACHY-BRADY SYNDROME 09/2007   s/p PPM st jud[employer]  . Sleep apnea   . SMALL BOWEL OBSTRUCTION 04/18/2009   Qualifier: History of  By: Asa Lente MD, Jannifer Rodney Symptomatic diverticulosis 01/18/2009   Qualifier: Diagnosis of  By: Shane Crutch, Amy S    Past Surgical History:  Past Surgical History:  Procedure Laterality Date  . BACK SURGERY    . Bilateral cataracts    . COLON RESECTION N/A 11/28/2014   Procedure: EXPLORATORY LAPAROTOMY, SIGMOID  COLECTOMY WITH COLOSTOMY;  Surgeon: Jackolyn Confer, MD;  Location: [facility];  Service: General;  Laterality: N/A;  . COLON SURGERY    . COLONOSCOPY    . CORONARY ARTERY BYPASS GRAFT    . ESOPHAGOGASTRODUODENOSCOPY  multiple  . FLEXIBLE SIGMOIDOSCOPY N/A 09/22/2013   Procedure: FLEXIBLE SIGMOIDOSCOPY;  Surgeon: Gatha Mayer, MD;  Location: [facility];  Service: Endoscopy;  Laterality: N/A;  . INGUINAL HERNIA REPAIR Bilateral   . IR PERC CHOLECYSTOSTOMY  05/10/2018  . [place] SURGERY  12/2008  . PACEMAKER INSERTION     DDD/[employer]         Last interrogation 2/13  on chart     Pacemaker guideline order Dr Tamala Julian on chart  . Partial small bowel obstruction  2009  . PENILE PROSTHESIS PLACEMENT    . PTCA  2008, 2009, 2012   with DES  . REMOVAL OF PENILE PROSTHESIS N/A 04/22/2017   Procedure: EXPLANT OF MULTICOMPONENT PENILE PROSTHESIS;  Surgeon: Irine Seal, MD;  Location: [facility];  Service: Urology;  Laterality: N/A;  . TOTAL HIP ARTHROPLASTY  08/21/2011   Procedure: TOTAL HIP ARTHROPLASTY;  Surgeon: Johnn Hai, MD;  Location: [facility];  Service: Orthopedics;  Laterality: Right;   HPI:  Eric [name] is a 84  y.o. male with medical history significant for coronary artery disease and aortic sclerosis hypertension hyperlipidemia, diabetes, COPD, O2 dependent on 3 L at home, chronic respiratory failure on oxygen, ischemic cardiomyopathy, atrial fibrillation on Eliquis. Patient presented with right upper quadrant pain, dx with acute cholecystitis s/p cholecystectomy 12/4. Also found to have evidence of bilateral pneumonia and positive blood cultures for E. Coli.   Assessment / Plan / Recommendation Clinical Impression  Overall swallowing function appearing relatively consistent with most recent instrumental testing complete in 06/2016 in which patient with what appears to be a functional oropharyngeal swallow with consumption of thin liquids via small cup sip, intermittent double  swallow to clear suspected pharyngeal residuals. Patient however, has very congested cough at baseline which continues during po intake interfering with ability to accurately evaluate at bedside, as well as a decline in respiratory function that he did not have during previous testing which decreases ability to protect airway during the swallow. Recommend NPO except for water only after oral care and meds crushed in puree if needed to mitigate risk of increasing infection. Plan for MBS next date (unable to complete this date).   SLP Visit Diagnosis: Dysphagia, pharyngeal phase (R13.13)    Aspiration Risk       Diet Recommendation Free water protocol after oral care;NPO   Medication Administration: Crushed with puree Supervision: Patient able to self feed Compensations: Slow rate;Small sips/bites Postural Changes: Seated upright at 90 degrees    Other  Recommendations Oral Care Recommendations: Oral care QID;Oral care prior to ice chip/H20   Follow up Recommendations (TBD)             Prognosis Prognosis for Safe Diet Advancement: Good      Swallow Study   General HPI: Eric [name] is a 84 y.o. male with medical history significant for coronary artery disease and aortic sclerosis hypertension hyperlipidemia, diabetes, COPD, O2 dependent on 3 L at home, chronic respiratory failure on oxygen, ischemic cardiomyopathy, atrial fibrillation on Eliquis. Patient presented with right upper quadrant pain, dx with acute cholecystitis s/p cholecystectomy 12/4. Also found to have evidence of bilateral pneumonia and positive blood cultures for E. Coli. Type of Study: Bedside Swallow Evaluation Previous Swallow Assessment: MBS in 2018 recommended regular, thin liquid, no straws and double swallow due to mild pharyngeal residuals Diet Prior to this Study: Thin liquids(clear liquid) Temperature Spikes Noted: No Respiratory Status: Nasal cannula History of Recent Intubation: No Behavior/Cognition:  Alert;Cooperative;Pleasant mood Oral Cavity Assessment: Dry Oral Care Completed by SLP: Recent completion by staff Oral Cavity - Dentition: Adequate natural dentition Vision: Functional for self-feeding Self-Feeding Abilities: Able to feed self Patient Positioning: Upright in bed Baseline Vocal Quality: Low vocal intensity Volitional Cough: Congested Volitional Swallow: Able to elicit    Oral/Motor/Sensory Function Overall Oral Motor/Sensory Function: Within functional limits   Ice Chips Ice chips: Within functional limits Presentation: Spoon   Thin Liquid Thin Liquid: (see impression statement)    Nectar Thick Nectar Thick Liquid: Not tested   Honey Thick Honey Thick Liquid: Not tested   Puree Puree: Not tested   Solid    Eric Maher MA, CCC-SLP   Solid: Not tested      Nhung Danko Meryl 05/12/2018,8:55 AM

## 2018-05-12 NOTE — Progress Notes (Signed)
PT demonstrated hands on understanding of Flutter device- productive cough at this time.  

## 2018-05-12 NOTE — Progress Notes (Signed)
PAtient crying asking if he is going to die, Notified Dr. Horris Latino, palliative care consulted.

## 2018-05-12 NOTE — Progress Notes (Signed)
ANTICOAGULATION CONSULT NOTE - Follow Up Consult  Pharmacy Consult for Heparin Indication: atrial fibrillation  Allergies  Allergen Reactions  . Actos [Pioglitazone Hydrochloride] Other (See Comments)    "felt funny, drowsy, and weak":  [name] Buprenorphine Hcl Nausea And Vomiting  . Celebrex [Celecoxib] Other (See Comments)    "felt funny"  . Demerol Palpitations and Other (See Comments)    Increased BP  . Meperidine Palpitations    Other reaction(s): Other (See Comments) Increased BP  . Morphine And Related Nausea And Vomiting  . Ciprofloxacin Other (See Comments)    arthralgia  . Metformin Nausea And Vomiting  . Zocor [Simvastatin] Other (See Comments)    Makes pt very drowsy    Patient Measurements: Height: 5\' 6"  (167.6 cm)(3 weeks ago per previous charting) Weight: 223 lb 12.8 oz (101.5 kg) IBW/kg (Calculated) : 63.8 Heparin Dosing Weight:   Vital Signs: Temp: 97.6 F (36.4 C) (12/05 0521) Temp Source: Oral (12/05 0521) BP: 131/94 (12/05 0521) Pulse Rate: 90 (12/05 0521)  Labs: Recent Labs    05/09/18 1152  05/10/18 0539  05/11/18 0236 05/11/18 1337 05/12/18 0554  HGB 8.0*   < > 7.8*  --  8.2*  --  8.7*  HCT 26.5*   < > 26.9*  --  27.7*  --  29.2*  PLT 140*   < > 126*  --  128*  --  117*  APTT 40*   < >  --    < > 67* 75* 63*  LABPROT 16.9*  --   --   --   --   --   --   INR 1.39  --   --   --   --   --   --   HEPARINUNFRC >2.20*  --   --   --  1.34* 1.32* 0.89*  CREATININE  --   --  1.67*  --  1.75*  --   --    < > = values in this interval not displayed.    Estimated Creatinine Clearance: 31.9 mL/min (A) (by C-G formula based on SCr of 1.75 mg/dL (H)).   Medications:  Infusions:  . sodium chloride 10 mL/hr at 05/10/18 0836  . cefTRIAXone (ROCEPHIN)  IV 2 g (05/11/18 2109)  . heparin 1,400 Units/hr (05/12/18 0459)  . metronidazole 500 mg (05/12/18 0108)    Assessment: Patient with low PTT and high heparin level.  Will use PTT at this time.  PTT  ordered with Heparin level until both correlate due to possible drug-lab interaction between oral anticoagulant (rivaroxaban, edoxaban, or apixaban) and anti-Xa level (aka heparin level)  Goal of Therapy:  Heparin level 0.3-0.7 units/ml aPTT 66-102 seconds Monitor platelets by anticoagulation protocol: Yes   Plan:  Increase heparin drip to 1500 units/hr Recheck levels at [place] 05/12/2018,7:05 AM

## 2018-05-12 NOTE — Progress Notes (Signed)
Inpatient Diabetes Program Recommendations  AACE/ADA: New Consensus Statement on Inpatient Glycemic Control (2015)  Target Ranges:  Prepandial:   less than 140 mg/dL      Peak postprandial:   less than 180 mg/dL (1-2 hours)      Critically ill patients:  140 - 180 mg/dL   Results for ABRAHAM, ENTWISTLE (MRN [id number]) as of 05/12/2018 11:06  Ref. Range 05/10/2018 05:39 05/11/2018 02:36 05/12/2018 05:54  Glucose Latest Ref Range: 70 - 99 mg/dL 135 (H) 191 (H) 221 (H)    Admit with: Abd Pain/ Acute Cholecystitis  History: DM  Home DM Meds: Novolog 0-9 units TID per SSI  Current Orders: None    Getting Prednisone 40 mg Daily.  NO orders for CBG checks.    MD- Patient with History of DM.  NO CBG checks ordered.  Please consider placing orders for Novolog Sensitive Correction Scale/ SSI (0-9 units) TID AC + HS    --Will follow patient during hospitalization--  Wyn Quaker RN, MSN, CDE Diabetes Coordinator Inpatient Glycemic Control Team Team Pager: [phone] (8a-5p)

## 2018-05-12 NOTE — Progress Notes (Signed)
ANTICOAGULATION CONSULT NOTE - Follow Up Consult  Pharmacy Consult for Heparin Indication: atrial fibrillation  Allergies  Allergen Reactions  . Actos [Pioglitazone Hydrochloride] Other (See Comments)    "felt funny, drowsy, and weak":  [name] Buprenorphine Hcl Nausea And Vomiting  . Celebrex [Celecoxib] Other (See Comments)    "felt funny"  . Demerol Palpitations and Other (See Comments)    Increased BP  . Meperidine Palpitations    Other reaction(s): Other (See Comments) Increased BP  . Morphine And Related Nausea And Vomiting  . Ciprofloxacin Other (See Comments)    arthralgia  . Metformin Nausea And Vomiting  . Zocor [Simvastatin] Other (See Comments)    Makes pt very drowsy    Patient Measurements: Height: 5\' 6"  (167.6 cm)(3 weeks ago per previous charting) Weight: 223 lb 12.8 oz (101.5 kg) IBW/kg (Calculated) : 63.8 Heparin Dosing Weight:   Vital Signs: BP: 127/77 (12/05 1221) Pulse Rate: 91 (12/05 1221)  Labs: Recent Labs    05/10/18 0539  05/11/18 0236 05/11/18 1337 05/12/18 0554 05/12/18 1544  HGB 7.8*  --  8.2*  --  8.7*  --   HCT 26.9*  --  27.7*  --  29.2*  --   PLT 126*  --  128*  --  117*  --   APTT  --    < > 67* 75* 63* 99*  HEPARINUNFRC  --    < > 1.34* 1.32* 0.89* 0.89*  CREATININE 1.67*  --  1.75*  --  1.79*  --    < > = values in this interval not displayed.    Estimated Creatinine Clearance: 31.2 mL/min (A) (by C-G formula based on SCr of 1.79 mg/dL (H)).   Medications:  Infusions:  . sodium chloride 10 mL/hr at 05/10/18 0836  . cefTRIAXone (ROCEPHIN)  IV 2 g (05/11/18 2109)  . heparin 1,500 Units/hr (05/12/18 0708)  . metronidazole 500 mg (05/12/18 1714)    Assessment: Patient with low PTT and high heparin level.  Will use PTT at this time.  PTT ordered with Heparin level until both correlate due to possible drug-lab interaction between oral anticoagulant (rivaroxaban, edoxaban, or apixaban) and anti-Xa level (aka heparin  level)  05/12/18 6:43 PM - aPTT at goal, levels still not correlating - no bleeding or line issues per RN   Goal of Therapy:  Heparin level 0.3-0.7 units/ml aPTT 66-102 seconds Monitor platelets by anticoagulation protocol: Yes   Plan:  Continue heparin drip at 1500 units/hr.  HL/aPTT/CBC with AM labs  Napoleon Form 05/12/2018,6:41 PM

## 2018-05-12 NOTE — Progress Notes (Signed)
Referring Physician(s): Saverio Danker  Supervising Physician: Daryll Brod  Patient Status:  [facility] - In-pt  Chief Complaint: None  Subjective:  Acute cholecystitis s/p percutaneous cholecystostomy drain placement 05/10/2018 by Dr. Kathlene Cote. Patient awake and alert sitting in bed with no complaints at this time. Percutaneous cholecystostomy drain incision c/d/i.   Allergies: Actos [pioglitazone hydrochloride]; Buprenorphine hcl; Celebrex [celecoxib]; Demerol; Meperidine; Morphine and related; Ciprofloxacin; Metformin; and Zocor [simvastatin]  Medications: Prior to Admission medications   Medication Sig Start Date End Date Taking? Authorizing Provider  amiodarone (PACERONE) 200 MG tablet Take 1 tablet (200 mg total) by mouth 2 (two) times daily. 04/20/18  Yes Buriev, Arie Sabina, MD  apixaban (ELIQUIS) 5 MG TABS tablet Take 2.5 mg by mouth 2 (two) times daily.   Yes [provider]  aspirin EC 81 MG EC tablet Take 1 tablet (81 mg total) by mouth daily. 04/21/18  Yes Buriev, Arie Sabina, MD  budesonide (PULMICORT) 0.25 MG/2ML nebulizer solution Take 2 mLs (0.25 mg total) by nebulization 2 (two) times daily. 12/06/17  Yes Lauraine Rinne, NP  diltiazem (CARDIZEM CD) 180 MG 24 hr capsule Take 1 capsule (180 mg total) by mouth daily. 04/21/18  Yes Buriev, Arie Sabina, MD  fluticasone (FLONASE) 50 MCG/ACT nasal spray Place 2 sprays into both nostrils daily. Allergies 08/01/14  Yes [name], MD  furosemide (LASIX) 40 MG tablet Take 1 tablet (40 mg total) by mouth 2 (two) times daily. 03/31/18  Yes Belva Crome, MD  HYDROcodone-acetaminophen (NORCO) 7.5-325 MG tablet Take 1 tablet by mouth every 6 (six) hours as needed for moderate pain or severe pain.   Yes [provider]  insulin aspart (NOVOLOG) 100 UNIT/ML injection Inject 0-5 Units into the skin at bedtime. Insulin sliding scale Patient taking differently: Inject 0-9 Units into the skin 3 (three) times daily with  meals. Insulin sliding scale  101-150=1 units 151-200=2 units 201-250=3 units 251-300=5 units 301-350=7 units >350=9 units Call MD for BS>400 or <60 04/20/18  Yes Buriev, Arie Sabina, MD  ipratropium-albuterol (DUONEB) 0.5-2.5 (3) MG/3ML SOLN Take 3 mLs by nebulization 3 (three) times daily.  02/17/17  Yes [provider]  isosorbide mononitrate (IMDUR) 30 MG 24 hr tablet Take 0.5 tablets (15 mg total) by mouth daily. 04/21/18  Yes Buriev, Arie Sabina, MD  pravastatin (PRAVACHOL) 20 MG tablet Take 20 mg by mouth daily.    Yes [provider]  ranitidine (ZANTAC) 300 MG tablet Take 600 mg by mouth at bedtime.    Yes [provider]  senna (SENOKOT) 8.6 MG tablet Take 2 tablets by mouth daily.    Yes [provider]  acetaminophen (TYLENOL) 500 MG tablet Take 1,000 mg by mouth every 6 (six) hours as needed for mild pain or fever.    [provider]  apixaban (ELIQUIS) 2.5 MG TABS tablet Take 1 tablet (2.5 mg total) by mouth 2 (two) times daily. Patient not taking: Reported on 05/08/2018 04/20/18   Kinnie Feil, MD  glucose blood (ONE TOUCH ULTRA TEST) test strip USE TO CHECK BLOOD SUGARS ONCE A DAY 10/28/17   [name], MD  guaiFENesin-dextromethorphan (ROBITUSSIN DM) 100-10 MG/5ML syrup Take 5 mLs by mouth every 4 (four) hours as needed for cough. 10/15/16   Lavina Hamman, MD     Vital Signs: BP 105/80   Pulse 91   Temp 97.6 F (36.4 C) (Oral)   Resp (!) 30   Ht 5\' 6"  (1.676 m)  Comment: 3 weeks ago per previous charting  Wt 223 lb 12.8 oz (101.5 kg)   SpO2 99%   BMI 36.12 kg/m   Physical Exam  Constitutional: He is oriented to person, place, and time. He appears well-developed and well-nourished. No distress.  Pulmonary/Chest: Effort normal. No respiratory distress.  Abdominal:  Percutaneous cholecystostomy drain incision without erythema, drainage, or tenderness; approximately 100 mL dark brown fluid in gravity bag; drain  flushes/aspirates without resistance.  Neurological: He is alert and oriented to person, place, and time.  Skin: Skin is warm and dry.  Psychiatric: He has a normal mood and affect. His behavior is normal. Judgment and thought content normal.  Nursing note and vitals reviewed.   Imaging: Ct Chest Wo Contrast  Result Date: 05/11/2018 CLINICAL DATA:  Concern for empyema. Chest pain, shortness of breath. Pleural effusions suspected. EXAM: CT CHEST WITHOUT CONTRAST TECHNIQUE: Multidetector CT imaging of the chest was performed following the standard protocol without IV contrast. COMPARISON:  Chest x-ray 05/11/2018.  Chest CT 06/03/2016. FINDINGS: Cardiovascular: Prior CABG. Cardiomegaly. Diffusely calcified aorta. No aneurysm. Left pacer in place with leads in the right atrium and right ventricle. Mediastinum/Nodes: Borderline sized scattered mediastinal lymph nodes, likely reactive. No axillary or visible hilar adenopathy. Lungs/Pleura: Mild emphysema. Airspace consolidation in both lower lobes as well as the right middle lobe concerning for pneumonia. Small bilateral pleural effusions. Upper Abdomen: Stable low-density lesion in the left hepatic lobe, likely cyst. Musculoskeletal: Chest wall soft tissues are unremarkable. Pacer battery pack in the left chest wall. IMPRESSION: Consolidation in the lower lobes bilaterally as well as right middle lobe most compatible with pneumonia. Small bilateral pleural effusions. Cardiomegaly.  Prior CABG. Aortic Atherosclerosis (ICD10-I70.0) and Emphysema (ICD10-J43.9). Electronically Signed   By: Rolm Baptise M.D.   On: 05/11/2018 22:30   Nm Hepatobiliary Liver Func  Result Date: 05/09/2018 CLINICAL DATA:  Right upper quadrant abdominal pain. EXAM: NUCLEAR MEDICINE HEPATOBILIARY IMAGING TECHNIQUE: Sequential images of the abdomen were obtained out to 60 minutes following intravenous administration of radiopharmaceutical. RADIOPHARMACEUTICALS:  4.9 mCi Tc-64m  Choletec  IV COMPARISON:  CT scan and ultrasound of May 08, 2018. FINDINGS: Prompt uptake and biliary excretion of activity by the liver is seen. No filling of the gallbladder is noted. Morphine was not administered due to allergy. Biliary activity passes into small bowel, consistent with patent common bile duct. IMPRESSION: No filling of gallbladder is noted consistent with cystic duct obstruction and possible acute cholecystitis. Electronically Signed   By: Marijo Conception, M.D.   On: 05/09/2018 17:03   Ir Perc Cholecystostomy  Result Date: 05/10/2018 CLINICAL DATA:  Acute cholecystitis and need for percutaneous cholecystostomy due to high risk currently for cholecystectomy. EXAM: PERCUTANEOUS CHOLECYSTOSTOMY COMPARISON:  CT and ultrasound studies on 05/08/2018 ANESTHESIA/SEDATION: 0.5 mg IV Versed; 50 mcg IV Fentanyl. Total Moderate Sedation Time 17 minutes. The patient's level of consciousness and physiologic status were continuously monitored during the procedure by Radiology nursing. CONTRAST:  13 mL Isovue-300 MEDICATIONS: No additional medications. FLUOROSCOPY TIME:  1 minutes and 12 seconds.  50.9 mGy. PROCEDURE: The procedure, risks, benefits, and alternatives were explained to the patient's daughter. Questions regarding the procedure were encouraged and answered. The patient's daughter understands and consents to the procedure. A time-out was performed prior to initiating the procedure. The right abdominal wall was prepped with chlorhexidine in a sterile fashion, and a sterile drape was applied covering the operative field. A sterile gown and sterile gloves were used for the procedure. Local  anesthesia was provided with 1% Lidocaine. Ultrasound image documentation was performed. Fluoroscopy during the procedure and fluoro spot radiograph confirms appropriate catheter position. Ultrasound was utilized to localize the gallbladder. Under direct ultrasound guidance, a 21 gauge needle was advanced via a  transhepatic approach into the gallbladder lumen. Aspiration was performed and a bile sample sent for culture studies. A small amount of diluted contrast material was injected. A guide wire was then advanced into the gallbladder. A transitional dilator was placed. Percutaneous tract dilatation was then performed over a guide wire to 10-French. A 10-French pigtail drainage catheter was then advanced into the gallbladder lumen under fluoroscopy. Catheter was formed and injected with contrast material to confirm position. The catheter was flushed and connected to a gravity drainage bag. It was secured at the skin with a Prolene retention suture and Stat-Lock device. COMPLICATIONS: None FINDINGS: After needle puncture of the gallbladder, a bile sample was aspirated and sent for culture. Transhepatic approach to cholecystostomy tube placement was chosen through a short segment of the right lobe of the liver as there is colon very close to the gallbladder lumen just under the abdominal wall. The cholecystostomy tube was advanced into the gallbladder lumen and formed. It is now draining bile. This tube will be left to gravity drainage. IMPRESSION: Percutaneous cholecystostomy with placement of 10-French drainage catheter into the gallbladder lumen. This was left to gravity drainage. Electronically Signed   By: Aletta Edouard M.D.   On: 05/10/2018 17:07   Dg Chest Port 1 View  Result Date: 05/11/2018 CLINICAL DATA:  Acute respiratory failure with hypoxia. Coronary artery disease. Chronic myeloid leukemia. EXAM: PORTABLE CHEST 1 VIEW COMPARISON:  05/09/2018 FINDINGS: Stable cardiomegaly. Transvenous pacemaker remains in appropriate position. Prior CABG again noted. Symmetric bibasilar airspace disease and small bilateral pleural effusions show no significant change. IMPRESSION: No significant change in symmetric bibasilar airspace disease and small bilateral pleural effusions, likely due to congestive heart failure.  Electronically Signed   By: Earle Gell M.D.   On: 05/11/2018 08:35   Dg Chest Port 1 View  Result Date: 05/09/2018 CLINICAL DATA:  Respiratory failure EXAM: PORTABLE CHEST 1 VIEW COMPARISON:  04/12/2018 FINDINGS: Cardiac shadow is stable. Postsurgical changes are again seen. Pacing device is again noted and stable. Increased vascular congestion with interstitial edema is noted. Chronic blunting of left costophrenic angle is noted. Left basilar scarring is again noted. IMPRESSION: Changes of congestive heart failure with edema. Chronic changes in the left base appear Electronically Signed   By: Inez Catalina M.D.   On: 05/09/2018 12:11    Labs:  CBC: Recent Labs    05/10/18 0016 05/10/18 0539 05/11/18 0236 05/12/18 0554  WBC 20.8* 18.9* 15.2* 9.4  HGB 7.8* 7.8* 8.2* 8.7*  HCT 25.6* 26.9* 27.7* 29.2*  PLT 137* 126* 128* 117*    COAGS: Recent Labs    04/16/18 0209 04/17/18 0252 04/18/18 0227 05/09/18 1152  05/10/18 0824 05/11/18 0236 05/11/18 1337 05/12/18 0554  INR 1.29 1.36 1.31 1.39  --   --   --   --   --   APTT  --   --   --  40*   < > 70* 67* 75* 63*   < > = values in this interval not displayed.    BMP: Recent Labs    05/09/18 0548 05/10/18 0539 05/11/18 0236 05/12/18 0554  NA 139 142 145 145  K 3.8 3.5 3.2* 3.4*  CL 101 105 106 107  CO2 24 23  26 29  GLUCOSE 134* 135* 191* 221*  BUN 25* 35* 51* 63*  CALCIUM 9.0 9.0 9.3 9.4  CREATININE 1.58* 1.67* 1.75* 1.79*  GFRNONAA 38* 36* 34* 33*  GFRAA 44* 41* 39* 38*    LIVER FUNCTION TESTS: Recent Labs    05/09/18 0548 05/10/18 0539 05/11/18 0236 05/12/18 0554  BILITOT 1.3* 1.0 1.1 0.7  AST 28 26 33 16  ALT 15 15 20 19   ALKPHOS 77 80 97 89  PROT 6.5 6.1* 6.4* 6.8  ALBUMIN 2.8* 2.7* 2.8* 2.9*    Assessment and Plan:  Acute cholecystitis s/p percutaneous cholecystostomy drain placement 05/10/2018 by Dr. Kathlene Cote. Percutaneous cholecystostomy drain stable with approximately 100 mL dark brown fluid in  gravity bag. Continue with Qshift flushes and monitoring of output. Appreciate and agree with [facility] management. IR to follow.   Electronically Signed: Earley Abide, PA-C 05/12/2018, 11:51 AM   I spent a total of 25 Minutes at the the patient's bedside AND on the patient's hospital floor or unit, greater than 50% of which was counseling/coordinating care for acute cholecystitis s/p percutaneous cholecystostomy drain placement.

## 2018-05-13 ENCOUNTER — Inpatient Hospital Stay (HOSPITAL_COMMUNITY): Payer: Medicare Other

## 2018-05-13 DIAGNOSIS — K810 Acute cholecystitis: Principal | ICD-10-CM

## 2018-05-13 DIAGNOSIS — Z7189 Other specified counseling: Secondary | ICD-10-CM

## 2018-05-13 DIAGNOSIS — Z515 Encounter for palliative care: Secondary | ICD-10-CM

## 2018-05-13 LAB — BASIC METABOLIC PANEL
Anion gap: 17 — ABNORMAL HIGH (ref 5–15)
BUN: 69 mg/dL — ABNORMAL HIGH (ref 8–23)
CO2: 25 mmol/L (ref 22–32)
Calcium: 9.2 mg/dL (ref 8.9–10.3)
Chloride: 103 mmol/L (ref 98–111)
Creatinine, Ser: 1.82 mg/dL — ABNORMAL HIGH (ref 0.61–1.24)
GFR calc Af Amer: 37 mL/min — ABNORMAL LOW (ref >60)
GFR calc non Af Amer: 32 mL/min — ABNORMAL LOW (ref >60)
GLUCOSE: 182 mg/dL — AB (ref 70–99)
Potassium: 3 mmol/L — ABNORMAL LOW (ref 3.5–5.1)
Sodium: 145 mmol/L (ref 135–145)

## 2018-05-13 LAB — CBC WITH DIFFERENTIAL/PLATELET
Abs Immature Granulocytes: 1.08 10*3/uL — ABNORMAL HIGH (ref 0.00–0.07)
Basophils Absolute: 0 10*3/uL (ref 0.0–0.1)
Basophils Relative: 0 %
EOS ABS: 0 10*3/uL (ref 0.0–0.5)
Eosinophils Relative: 0 %
HCT: 30.9 % — ABNORMAL LOW (ref 39.0–52.0)
HEMOGLOBIN: 9.1 g/dL — AB (ref 13.0–17.0)
Immature Granulocytes: 11 %
Lymphocytes Relative: 5 %
Lymphs Abs: 0.5 10*3/uL — ABNORMAL LOW (ref 0.7–4.0)
MCH: 29.8 pg (ref 26.0–34.0)
MCHC: 29.4 g/dL — AB (ref 30.0–36.0)
MCV: 101.3 fL — ABNORMAL HIGH (ref 80.0–100.0)
Monocytes Absolute: 1.5 10*3/uL — ABNORMAL HIGH (ref 0.1–1.0)
Monocytes Relative: 15 %
Neutro Abs: 6.9 10*3/uL (ref 1.7–7.7)
Neutrophils Relative %: 69 %
Platelets: 141 10*3/uL — ABNORMAL LOW (ref 150–400)
RBC: 3.05 MIL/uL — ABNORMAL LOW (ref 4.22–5.81)
RDW: 15.1 % (ref 11.5–15.5)
WBC: 10 10*3/uL (ref 4.0–10.5)
nRBC: 0.7 % — ABNORMAL HIGH (ref 0.0–0.2)

## 2018-05-13 LAB — GLUCOSE, CAPILLARY
Glucose-Capillary: 146 mg/dL — ABNORMAL HIGH (ref 70–99)
Glucose-Capillary: 222 mg/dL — ABNORMAL HIGH (ref 70–99)
Glucose-Capillary: 208 mg/dL — ABNORMAL HIGH (ref 70–99)

## 2018-05-13 LAB — APTT
APTT: 91 s — AB (ref 24–36)
aPTT: 106 seconds — ABNORMAL HIGH (ref 24–36)

## 2018-05-13 LAB — HEPARIN LEVEL (UNFRACTIONATED)
Heparin Unfractionated: 0.86 IU/mL — ABNORMAL HIGH (ref 0.30–0.70)
Heparin Unfractionated: 0.69 IU/mL (ref 0.30–0.70)

## 2018-05-13 MED ORDER — POTASSIUM CHLORIDE 10 MEQ/100ML IV SOLN
10.0000 meq | INTRAVENOUS | Status: AC
  Administered 2018-05-13 (×4): 10 meq via INTRAVENOUS
  Filled 2018-05-13: qty 100

## 2018-05-13 MED ORDER — MIRTAZAPINE 15 MG PO TBDP
7.5000 mg | ORAL_TABLET | Freq: Every day | ORAL | Status: DC
  Administered 2018-05-13: 7.5 mg via ORAL
  Filled 2018-05-13: qty 0.5

## 2018-05-13 MED ORDER — HEPARIN (PORCINE) 25000 UT/250ML-% IV SOLN
1350.0000 [IU]/h | INTRAVENOUS | Status: AC
  Administered 2018-05-13: 1350 [IU]/h via INTRAVENOUS
  Filled 2018-05-13: qty 250

## 2018-05-13 MED ORDER — HYDROMORPHONE HCL 1 MG/ML IJ SOLN
1.0000 mg | INTRAMUSCULAR | Status: DC | PRN
  Administered 2018-05-13 – 2018-05-14 (×2): 1 mg via INTRAVENOUS
  Filled 2018-05-13 (×2): qty 1

## 2018-05-13 NOTE — Consult Note (Signed)
Consultation Note Date: 05/13/2018   Patient Name: Eric [name]  DOB: Nov 10, 1926  MRN: [id number]  Age / Sex: 84 y.o., male  PCP: [name], MD Referring Physician: Alma Friendly, MD  Reason for Consultation: Establishing goals of care  HPI/Patient Profile: 84 y.o. male  with past medical history of CAD, HTN, HLD, DM, COPD on 3 L O2, ischemic cardiomyopathy, and CKD admitted on 05/08/2018 with RUQ pain concerning for acute cholecystitis. Also found to have bilateral pna and blood cultures positive for E. Coli. D/p perc chole drain placement as he was not a surgical candidate d/t comorbidities. PMT consulted for [place].   Clinical Assessment and Goals of Care: I have reviewed medical records including EPIC notes, labs and imaging, received report from Dr. Horris Latino, assessed the patient and then met with patient to discuss diagnosis prognosis, GOC, EOL wishes, disposition and options.  Patient does not wish to involve his family members in his care.   I introduced Palliative Medicine as specialized medical care for people living with serious illness. It focuses on providing relief from the symptoms and stress of a serious illness. The goal is to improve quality of life for both the patient and the family.  Patient is a bit withdrawn, tearful, and appears depressed. Per chart review, he has been stating he wants to die.   As far as functional and nutritional status, he tells me he is able to ambulate with a walker and has been working with PT at rehab facility. He endorses a poor appetite.   We discussed his current illness and what it means in the larger context of his on-going co-morbidities.  Natural disease trajectory and expectations at EOL were discussed. Patient tells me he is ready to die.  I attempted to elicit values and goals of care important to the patient.    The difference between aggressive medical intervention  and comfort care was considered in light of the patient's goals of care. Patient has set limits in his care, such as DNR, however, he does tell me he wants to continue current medical care such as IV antibiotics.   We discussed his mood, poor appetite, trouble sleeping - patient agrees to trial of mirtazipine. He also c/o abdominal pain and shortness of breath - has dilaudid ordered. RN to bring medication.   Hospice and Palliative Care services outpatient were explained and offered. Patient has had hospice care before but tells me he was recently denied a couple of weeks ago? It also appears patient will be readmitted to rehab after discharge which would make him ineligible for hospice. Agreeable to continued palliative care outpatient.   Questions and concerns were addressed. The patient was encouraged to call with questions or concerns.   Primary Decision Maker PATIENT    SUMMARY OF RECOMMENDATIONS   - [place] discussion limited d/t symptoms - c/o abdominal pain, tearful about situation - patient agrees to trial of mirtazipine - patient wants to continue current care - he would be agreeable to hospice care after discharge however it appears patient will be readmitted to rehab and will be ineligible for both rehab and hospice - recommended continued outpatient palliative care  Code Status/Advance Care Planning:  DNR  Symptom Management:   mirtazipine 7.5 mg QHS  Continue pain management per primary  Prognosis:   Unable to determine  Discharge Planning: [facility] for rehab with Palliative care service follow-up      Primary Diagnoses: Present on Admission: . Acute cholecystitis .  Obesity (BMI 30-39.9) . Hypertensive heart disease with CHF ([place]) . Essential hypertension . Atrial fibrillation with RVR ([place]) . Obstructive sleep apnea . Chronic diastolic heart failure ([place]) . (Resolved) Pneumonia . COPD mixed type ([place]) . Pneumonia . Pneumonia due to other  specified infectious organisms   I have reviewed the medical record, interviewed the patient and family, and examined the patient. The following aspects are pertinent.  Past Medical History:  Diagnosis Date  . ALLERGIC RHINITIS   . ANEMIA-NOS   . AORTIC SCLEROSIS   . Asthma   . CARDIOMYOPATHY, ISCHEMIC   . CAROTID BRUIT, RIGHT 02/27/2008  . Cataract    surgery  . CML (chronic myeloid leukemia) ([place]) 06/26/2015  . COPD   . CORONARY ARTERY DISEASE    a. s/p CABG in 1995 b. DES in 2008, 2009, and most recent in 2012 with DES to SVG-OM  . DIABETES MELLITUS-TYPE II    diet controlled  . Diastolic dysfunction, Grade 1 11/24/2014  . Diverticulitis of colon with perforation 11/22/2014  . Diverticulosis   . GERD   . HIATAL HERNIA   . Hx of echocardiogram    Echo (9/15):  Mild LVH, EF 50-55%, no RWMA, Gr 1 DD, MAC, mild LAE.  [name] HYPERLIPIDEMIA   . HYPERTENSION   . Hyponatremia 11/22/2014  . IBS (irritable bowel syndrome)   . LACTOSE INTOLERANCE   . OA (osteoarthritis)   . OBESITY   . On home oxygen therapy    "3L all the time" (10/12/2016)  . Partial small bowel obstruction ([place])   . PERIPHERAL VASCULAR DISEASE   . Primary hyperparathyroidism ([place])    Lab Results Component Value Date  PTH 150.7* 02/13/2013  CALCIUM 11.0* 02/13/2013  CAION 1.21 03/15/2008    . Prostate cancer ([place])    seed implants 2004  . SICK SINUS/ TACHY-BRADY SYNDROME 09/2007   s/p PPM st judes  . Sleep apnea   . SMALL BOWEL OBSTRUCTION 04/18/2009   Qualifier: History of  By: Asa Lente MD, Jannifer Rodney Symptomatic diverticulosis 01/18/2009   Qualifier: Diagnosis of  By: Shane Crutch, Amy S    Social History   Socioeconomic History  . Marital status: Widowed    Spouse name: Not on file  . Number of children: Not on file  . Years of education: Not on file  . Highest education level: Not on file  Occupational History  . Occupation: retired    Fish farm manager: RETIRED  Social Needs  . Financial resource strain:  Somewhat hard  . Food insecurity:    Worry: Patient refused    Inability: Patient refused  . Transportation needs:    Medical: Patient refused    Non-medical: Patient refused  Tobacco Use  . Smoking status: Former Smoker    Packs/day: 1.00    Years: 25.00    Pack years: 25.00    Types: Cigarettes    Last attempt to quit: 06/08/1994    Years since quitting: 23.9  . Smokeless tobacco: Never Used  Substance and Sexual Activity  . Alcohol use: No    Alcohol/week: 0.0 standard drinks  . Drug use: No  . 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 Physical activity:    Days per week: Patient refused    Minutes per session: Patient refused  . Stress: Not on file  Relationships  . Social connections:    Talks on phone: Patient refused  Gets together: Patient refused    Attends religious service: Patient refused    Active member of club or organization: Patient refused    Attends meetings of clubs or organizations: Patient refused    Relationship status: Patient refused  Other Topics Concern  . Not on file  Social History Narrative  . Not on file   Family History  Problem Relation Age of Onset  . Hypertension Mother   . Cancer Mother   . Heart attack Unknown   . Heart attack Unknown   . Heart attack Brother   . Colon cancer Neg Hx   . Stroke Neg Hx    Scheduled Meds: . acetaminophen  1,000 mg Oral Q8H  . amiodarone  200 mg Oral BID  . budesonide  0.25 mg Nebulization BID  . chlorhexidine  15 mL Mouth Rinse BID  . guaiFENesin  1,200 mg Oral BID  . insulin aspart  0-9 Units Subcutaneous TID WC  . ipratropium-albuterol  3 mL Nebulization Q6H  . mouth rinse  15 mL Mouth Rinse q12n4p  . metoprolol tartrate  12.5 mg Oral BID  . mirtazapine  7.5 mg Oral QHS  . pantoprazole  40 mg Oral QHS  . predniSONE  40 mg Oral Q breakfast  . sodium chloride flush  5 mL Intracatheter Q12H   Continuous  Infusions: . sodium chloride 1,000 mL (05/13/18 1235)  . cefTRIAXone (ROCEPHIN)  IV 2 g (05/12/18 2105)  . heparin 1,350 Units/hr (05/13/18 0846)  . metronidazole 500 mg (05/13/18 0945)   PRN Meds:.sodium chloride, HYDROmorphone (DILAUDID) injection, ipratropium-albuterol, metoprolol tartrate, ondansetron **OR** ondansetron (ZOFRAN) IV, simethicone Allergies  Allergen Reactions  . Actos [Pioglitazone Hydrochloride] Other (See Comments)    "felt funny, drowsy, and weak":  [name] Buprenorphine Hcl Nausea And Vomiting  . Celebrex [Celecoxib] Other (See Comments)    "felt funny"  . Demerol Palpitations and Other (See Comments)    Increased BP  . Meperidine Palpitations    Other reaction(s): Other (See Comments) Increased BP  . Morphine And Related Nausea And Vomiting  . Ciprofloxacin Other (See Comments)    arthralgia  . Metformin Nausea And Vomiting  . Zocor [Simvastatin] Other (See Comments)    Makes pt very drowsy   Review of Systems  Constitutional: Positive for activity change, appetite change and fatigue.  Respiratory: Positive for shortness of breath.   Gastrointestinal: Positive for abdominal pain.  Neurological: Positive for weakness.  Psychiatric/Behavioral: Positive for dysphoric mood and sleep disturbance. The patient is nervous/anxious.     Physical Exam  Constitutional: He is oriented to person, place, and time. He appears ill. No distress.  Cardiovascular: Normal rate. An irregularly irregular rhythm present.  Pulmonary/Chest: He has wheezes.  Abdominal: Soft. There is tenderness.  Colostomy and perc drain in place  Neurological: He is alert and oriented to person, place, and time.  Skin: Skin is warm and dry.  Psychiatric: He exhibits a depressed mood.    Vital Signs: BP 129/79 (BP Location: Left Arm)   Pulse 83   Temp 97.9 F (36.6 C) (Oral)   Resp 18   Ht _0  (1.676 m) Comment: 3 weeks ago per previous charting  Wt 100.7 kg   SpO2 99%   BMI 35.85 kg/m   Pain Scale: 0-10 POSS *See Group Information*: 1-Acceptable,Awake and alert Pain Score: 0-No pain   SpO2: SpO2: 99 % O2 Device:SpO2: 99 % O2 Flow Rate: .O2 Flow Rate (L/min): 4 L/min  IO: Intake/output summary:   Intake/Output  Summary (Last 24 hours) at 05/13/2018 1602 Last data filed at 05/13/2018 1505 Gross per 24 hour  Intake 885.17 ml  Output 1620 ml  Net -734.83 ml    LBM: Last BM Date: (states days) Baseline Weight: Weight: 101.7 kg Most recent weight: Weight: 100.7 kg     Palliative Assessment/Data: PPS 40%   Flowsheet Rows     Most Recent Value  Intake Tab  Referral Department  Hospitalist  Unit at Time of Referral  -- [urology/telementry]  Palliative Care Primary Diagnosis  Cardiac  Date Notified  05/12/18  Palliative Care Type  Return patient Palliative Care  Reason for referral  Clarify Goals of Care  Date of Admission  05/08/18  # of days IP prior to Palliative referral  4  Clinical Assessment  Psychosocial & Spiritual Assessment  Palliative Care Outcomes      Time Total: 60 minutes Greater than 50%  of this time was spent counseling and coordinating care related to the above assessment and plan.  Juel Burrow, DNP, AGNP-C Palliative Medicine Team [phone] Pager: [phone]

## 2018-05-13 NOTE — Progress Notes (Signed)
   05/13/18 1445  Clinical Encounter Type  Visited With Patient  Visit Type Follow-up  Referral From Chaplain  Consult/Referral To Chaplain  The chaplain followed up with Pt. spiritual care.  The Pt. continues to focus on his desire to receive forgiveness from his daughters.  The chaplain was not able to discern a time frame on the last time they talked, but the message the Pt. received from the last conversation and holds on to is he was a mean father. The Pt. did not entertain the idea of trying to talk to them again. The chaplain's follow up spiritual care will encourage the Pt. to explore the meaning of forgiveness to him.  The chaplain and Pt. prayed together before the chaplain exited the Pt. room.

## 2018-05-13 NOTE — Discharge Instructions (Signed)
Cholecystostomy, Care After Refer to this sheet in the next few weeks. These instructions provide you with information about caring for yourself after your procedure. Your health care provider may also give you more specific instructions. Your treatment has been planned according to current medical practices, but problems sometimes occur. Call your health care provider if you have any problems or questions after your procedure. What can I expect after the procedure? After your procedure, it is common to have soreness near the site of your drainage tube (catheter) or your incision. Follow these instructions at home: Incision care  Follow instructions from your health care provider about how to take care of your incision. Make sure you: ? Wash your hands with soap and water before you change your bandage (dressing). If soap and water are not available, use hand sanitizer. ? Change your dressing as told by your health care provider. ? Leave stitches (sutures), skin glue, or adhesive strips in place. These skin closures may need to be in place for 2 weeks or longer. If adhesive strip edges start to loosen and curl up, you may trim the loose edges. Do not remove adhesive strips completely unless your health care provider tells you to do that.  Check your incision and your drainage site every day for signs of infection. Watch for: ? Redness, swelling, or pain. ? Fluid, blood, or pus. General instructions  If you were sent home with a surgical drain in place, follow instructions from your health care provider about how to care for your drain and collection bag at home.  Do not take baths, swim, or use a hot tub until your health care provider approves. Ask your health care provider if you can take showers. You may only be allowed to take sponge baths for bathing.  Follow instructions from your health care provider about what you may eat or drink.  Take over-the-counter and prescription medicines only  as told by your health care provider.  Keep all follow-up visits as told by your health care provider. This is important. Contact a health care provider if:  You have redness, swelling, or pain at your incision or drainage site.  You have nausea or vomiting. Get help right away if:  Your abdominal pain gets worse.  You feel dizzy or you faint while standing.  You have fluid, blood, or pus coming from your incision or drainage site.  You have a fever.  You have shortness of breath.  You have a rapid heartbeat.  Your nausea or vomiting does not go away.  Your drainage tube becomes blocked.  Your drainage tube comes out of your abdomen. This information is not intended to replace advice given to you by your health care provider. Make sure you discuss any questions you have with your health care provider. Document Released: 02/13/2015 Document Revised: 10/31/2015 Document Reviewed: 09/05/2014 Elsevier Interactive Patient Education  2018 [employer]vier [employer]  

## 2018-05-13 NOTE — Progress Notes (Signed)
ANTICOAGULATION CONSULT NOTE - Follow Up Consult  Pharmacy Consult for Heparin Indication: atrial fibrillation  Allergies  Allergen Reactions  . Actos [Pioglitazone Hydrochloride] Other (See Comments)    "felt funny, drowsy, and weak":  [name] Buprenorphine Hcl Nausea And Vomiting  . Celebrex [Celecoxib] Other (See Comments)    "felt funny"  . Demerol Palpitations and Other (See Comments)    Increased BP  . Meperidine Palpitations    Other reaction(s): Other (See Comments) Increased BP  . Morphine And Related Nausea And Vomiting  . Ciprofloxacin Other (See Comments)    arthralgia  . Metformin Nausea And Vomiting  . Zocor [Simvastatin] Other (See Comments)    Makes pt very drowsy    Patient Measurements: Height: 5\' 6"  (167.6 cm)(3 weeks ago per previous charting) Weight: 222 lb 1.6 oz (100.7 kg) IBW/kg (Calculated) : 63.8 Heparin Dosing Weight: 86.3   Vital Signs: Temp: 98.9 F (37.2 C) (12/06 0444) BP: 134/88 (12/06 0444) Pulse Rate: 96 (12/06 0754)  Labs: Recent Labs    05/11/18 0236  05/12/18 0554 05/12/18 1544 05/13/18 0656  HGB 8.2*  --  8.7*  --  9.1*  HCT 27.7*  --  29.2*  --  30.9*  PLT 128*  --  117*  --  141*  APTT 67*   < > 63* 99* 106*  HEPARINUNFRC 1.34*   < > 0.89* 0.89* 0.86*  CREATININE 1.75*  --  1.79*  --  1.82*   < > = values in this interval not displayed.    Estimated Creatinine Clearance: 30.6 mL/min (A) (by C-G formula based on SCr of 1.82 mg/dL (H)).   Medications:  Infusions:  . sodium chloride 10 mL/hr at 05/10/18 0836  . cefTRIAXone (ROCEPHIN)  IV 2 g (05/12/18 2105)  . heparin 1,350 Units/hr (05/13/18 0846)  . metronidazole 500 mg (05/13/18 0945)  . potassium chloride      Assessment: 62 yoM with PMH CAD, HTN, HLD, DM, COPD on 3L O2, Afib on Eliquis, admitted for acute cholecystitis, bilateral PNA, and ACEOPD. Pharmacy to bridge with heparin while off Eliquis.    Baseline INR wnl, aPTT: slightly elevated (recent SQ  heparin)  Prior anticoagulation: Eliquis 2.5 mg bid (LD 11/30), ASA 81 daily  Significant events:  Today, 05/13/2018:  CBC: Hgb low but stable, Plt borderline low  Heparin level remains elevated but could be due to FXa recent inhibitor  APTT slightly elevated  No bleeding or infusion issues per nursing  CrCl: 31 ml/min  Goal of Therapy: Heparin level 0.3-0.7 units/ml Monitor platelets by anticoagulation protocol: Yes  Plan:  Reduce heparin IV infusion to 1350 units/hr  Check aPTT 8 hrs after rate change  Daily CBC and heparin level - looks like HL may be correlating with aPTT now  Monitor for signs of bleeding or thrombosis   Reuel Boom, PharmD, BCPS [phone] 05/13/2018, 12:33 PM

## 2018-05-13 NOTE — Progress Notes (Signed)
CC: Abdominal pain and pneumonia   Subjective: Still depressed, and crying. Just wants a shot so he can go to sleep.  His abdomen is much better.  He is getting a swallow evaluation.  Still congested breathing and biggest pain now is LUQ with cough.    Objective: Vital signs in last 24 hours: Temp:  [97.6 F (36.4 C)-98.9 F (37.2 C)] 98.9 F (37.2 C) (12/06 0444) Pulse Rate:  [91-127] 96 (12/06 0754) Resp:  [18-32] 18 (12/06 0754) BP: (100-134)/(58-88) 134/88 (12/06 0444) SpO2:  [94 %-100 %] 95 % (12/06 0754) Weight:  [100.7 kg] 100.7 kg (12/06 0500) Last BM Date: (states days)  Intake/Output from previous day: 12/05 0701 - 12/06 0700 In: 155 [P.O.:150] Out: 2170 [Urine:1100; Drains:320; Stool:750] Intake/Output this shift: Total I/O In: 145 [I.V.:40; Other:5; IV Piggyback:100] Out: -   General appearance: alert, cooperative, mild distress and crying and very depressed, just wants to die, ongoing thick productive cough Resp: clear to auscultation bilaterally GI: Minimal tenderness RUQ, biggest compaint is LUQ withcough.  Drain is working well  Lab Results:  Recent Labs    05/12/18 0554 05/13/18 0656  WBC 9.4 10.0  HGB 8.7* 9.1*  HCT 29.2* 30.9*  PLT 117* 141*    BMET Recent Labs    05/12/18 0554 05/13/18 0656  NA 145 145  K 3.4* 3.0*  CL 107 103  CO2 29 25  GLUCOSE 221* 182*  BUN 63* 69*  CREATININE 1.79* 1.82*  CALCIUM 9.4 9.2   PT/INR No results for input(s): LABPROT, INR in the last 72 hours.  Recent Labs  Lab 05/08/18 0328 05/09/18 0548 05/10/18 0539 05/11/18 0236 05/12/18 0554  AST 16 28 26  33 16  ALT 14 15 15 20 19   ALKPHOS 75 77 80 97 89  BILITOT 1.6* 1.3* 1.0 1.1 0.7  PROT 7.5 6.5 6.1* 6.4* 6.8  ALBUMIN 3.6 2.8* 2.7* 2.8* 2.9*     Lipase     Component Value Date/Time   LIPASE 20 05/08/2018 0328     Medications: . acetaminophen  1,000 mg Oral Q8H  . amiodarone  200 mg Oral BID  . budesonide  0.25 mg Nebulization BID   . chlorhexidine  15 mL Mouth Rinse BID  . guaiFENesin  1,200 mg Oral BID  . insulin aspart  0-9 Units Subcutaneous TID WC  . ipratropium-albuterol  3 mL Nebulization Q6H  . mouth rinse  15 mL Mouth Rinse q12n4p  . metoprolol tartrate  12.5 mg Oral BID  . pantoprazole  40 mg Oral QHS  . predniSONE  40 mg Oral Q breakfast  . sodium chloride flush  5 mL Intracatheter Q12H   . sodium chloride 10 mL/hr at 05/10/18 0836  . cefTRIAXone (ROCEPHIN)  IV 2 g (05/12/18 2105)  . heparin 1,350 Units/hr (05/13/18 0846)  . metronidazole 500 mg (05/13/18 0945)  . potassium chloride     Assessment/Plan COPD AF/on Eliquis - no dose in hospital Hx of CDHF/Hx CABG Type II diabetes Obesity BMI 36.8. Hx OSA Hypertension Hx colon resection/colostomy 11/2014, SBO 2009 Anemia/mild thrombocytopenia Debilitation Malnutrition  RUQ pain - HIDAScan positive - IR Perc Cholecystostomy 05/10/18   - sx from abdomen much improved   Bilateral pneumonia with effusions  Severe depression  - crying and just wants to die, thinks he has no one to help care for him.    FEN: IV fluids/clears =>> from our standpoint he can be advanced as tolerated, he says he is not hungry  and is           not trying to eat very much.           - swallow study pending ID: Maxipime 12/1 >>day 2 Flagyl 12/1 >>day 4Vancomycin 12/1 - 12/3  Rocephin 12/3 =>> day 4 DVT: Heparindrip Follow up: TBD  Plan:  From our standpoint he is doing much better, still has some respiratory issues and severe debilitation/malnutrition.      LOS: 5 days    Eric [name] 05/13/2018 [phone]

## 2018-05-13 NOTE — Progress Notes (Signed)
ANTICOAGULATION CONSULT NOTE - Follow Up Consult  Pharmacy Consult for Heparin Indication: atrial fibrillation  Allergies  Allergen Reactions  . Actos [Pioglitazone Hydrochloride] Other (See Comments)    "felt funny, drowsy, and weak":  [name] Buprenorphine Hcl Nausea And Vomiting  . Celebrex [Celecoxib] Other (See Comments)    "felt funny"  . Demerol Palpitations and Other (See Comments)    Increased BP  . Meperidine Palpitations    Other reaction(s): Other (See Comments) Increased BP  . Morphine And Related Nausea And Vomiting  . Ciprofloxacin Other (See Comments)    arthralgia  . Metformin Nausea And Vomiting  . Zocor [Simvastatin] Other (See Comments)    Makes pt very drowsy    Patient Measurements: Height: 5\' 6"  (167.6 cm)(3 weeks ago per previous charting) Weight: 222 lb 1.6 oz (100.7 kg) IBW/kg (Calculated) : 63.8 Heparin Dosing Weight: 86.3   Vital Signs: Temp: 97.9 F (36.6 C) (12/06 1335) Temp Source: Oral (12/06 1335) BP: 129/79 (12/06 1335) Pulse Rate: 83 (12/06 1357)  Labs: Recent Labs    05/11/18 0236  05/12/18 0554 05/12/18 1544 05/13/18 0656 05/13/18 1706  HGB 8.2*  --  8.7*  --  9.1*  --   HCT 27.7*  --  29.2*  --  30.9*  --   PLT 128*  --  117*  --  141*  --   APTT 67*   < > 63* 99* 106* 91*  HEPARINUNFRC 1.34*   < > 0.89* 0.89* 0.86* 0.69  CREATININE 1.75*  --  1.79*  --  1.82*  --    < > = values in this interval not displayed.    Estimated Creatinine Clearance: 30.6 mL/min (A) (by C-G formula based on SCr of 1.82 mg/dL (H)). Assessment: 6 yoM with PMH CAD, HTN, HLD, DM, COPD on 3L O2, Afib on Eliquis, admitted for acute cholecystitis, bilateral PNA, and ACEOPD. Pharmacy to bridge with heparin while off Eliquis.    Baseline INR wnl, aPTT: slightly elevated (recent SQ heparin)  Prior anticoagulation: Eliquis 2.5 mg bid (LD 11/30), ASA 81 daily  Significant events:  Today, 05/13/2018:  CBC: Hgb low but stable, Plt borderline  low  Heparin level 0.69 (therapeutic) and aPTT 91 (therapeutic) after rate decreased to 1350 units/hr  No bleeding or infusion issues per nursing  CrCl: 31 ml/min  Goal of Therapy: Heparin level 0.3-0.7 units/ml APTT 66-102 seconds Monitor platelets by anticoagulation protocol: Yes  Plan:  continue heparin IV infusion at 1350 units/hr  Daily CBC and heparin level, no more aPTTs  Monitor for signs of bleeding or thrombosis  Pt taking PO meds - ? Transition back to [place], Pharm.D [phone] 05/13/2018 6:40 PM

## 2018-05-13 NOTE — Progress Notes (Signed)
Modified Barium Swallow Progress Note  Patient Details  Name: Eric [name] MRN: [id number] Date of Birth: 1926-10-28  Today's Date: 05/13/2018  Modified Barium Swallow completed.  Full report located under Chart Review in the Imaging Section.  Brief recommendations include the following:  Clinical Impression  Pt presents with overall functional oropharyngeal swallow function with no aspiration or penetration.  Slight decreased organization in oral transiting and piecemealing noted.  In addition, mild pharyngeal residuals of liquids noted were decreased/largely cleared with dry swallows *reflexive.  Pt does appear with residuals in esophagus with retrograde propulsion - ? dysmotility.  Barium tablet given with pudding cleared pharynx easily but then lodged in esophagus and did clear into stomach after liquids - appeared to lodge at [place].  Pt admits he has problems with sensing liquids -water- coming back up.  Suspect esophageal dysphagia is largest aspiration risk due to backflow.  Note GOC meeting to take place and suspect waiting until after meeting to determine indication for esophageal workup.  Using live video, pt educated to findings/recommendations. - Re oropharyngeal swallow, he is appropriate for a dys3 (pt edentulous) thin diet.  Will follow up for pt/family education.  Thanks.     Swallow Evaluation Recommendations   Recommended Consults: Consider esophageal assessment(dependent on GOC)   SLP Diet Recommendations: Dysphagia 3 (Mech soft) solids;Thin liquid(from oropharyngeal swallow standpoint)   Liquid Administration via: Cup   Medication Administration: Crushed with puree(follow with liquid)   Supervision: Full supervision/cueing for compensatory strategies   Compensations: Slow rate;Small sips/bites;Follow solids with liquid;Other (Comment)(eat small frequent meals)   Postural Changes: Remain semi-upright after after feeds/meals (Comment);Seated upright at 90 degrees   Oral Care Recommendations: Oral care BID       Luanna Salk, MS [facility] SLP Acute Rehab Services Pager [phone] Office [phone]  Macario Golds 05/13/2018,9:44 AM

## 2018-05-13 NOTE — Clinical Social Work Note (Signed)
Clinical Social Work Assessment  Patient Details  Name: Eric [name] MRN: [id number] Date of Birth: 31-Dec-1926  Date of referral:  05/12/18               Reason for consult:  Discharge Planning                Permission sought to share information with:    Permission granted to share information::     Name::        Agency::     Relationship::     Contact Information:     Housing/Transportation Living arrangements for the past 2 months:  Skilled Nursing Facility([facility] ) Source of Information:  Patient Patient Interpreter Needed:  None Criminal Activity/Legal Involvement Pertinent to Current Situation/Hospitalization:    Significant Relationships:  None Lives with:  Facility Resident Do you feel safe going back to the place where you live?  Yes Need for family participation in patient care:  No (Coment)  Care giving concerns:  Patient seemed down and depressed about being in the hospital.    Social Worker assessment / plan:  CSW met with patient via bedside to discuss disposition plans. Patient is a current long term resident at [facility] and was previously a resident at [facility]. Patient voiced no wanting to return because he wanted to stay in the hospital due to "liking it better here". Patient seemed down and depressed in his mood however was open to talking with CSW while in the room. Patient stated he was fine with going to which ever facility he needed to go to at discharge. CSW offered to call patients family for him- patient declined and said he did not want his family involved. CSW will continue to follow up with patient for any discharge needs.   Employment status:  Retired Forensic scientist:  Medicare PT Recommendations:    Information / Referral to community resources:     Patient/Family's Response to care:  Patient appreciated CSW.   Patient/Family's Understanding of and Emotional Response to Diagnosis, Current Treatment, and Prognosis: Understands  current disposition plans.   Emotional Assessment Appearance:  Appears stated age Attitude/Demeanor/Rapport:    Affect (typically observed):  Sad, Pleasant, Anxious Orientation:  Oriented to Situation, Oriented to Self, Oriented to Place, Oriented to  Time Alcohol / Substance use:    Psych involvement (Current and /or in the community):  No (Comment)  Discharge Needs  Concerns to be addressed:  No discharge needs identified Readmission within the last 30 days:  No Current discharge risk:  None Barriers to Discharge:  No Barriers Identified   Weston Anna, LCSW 05/13/2018, 1:09 PM

## 2018-05-13 NOTE — Progress Notes (Signed)
Perc chole drain stable. Will need to keep for minimum 6-8 weeks, if not forever. Will arrange outpt cholangiogram to be done in 6 weeks after discharge. Given pt without gallstones, possible removal of drain in 6-8 weeks if cystic duct patent on cholangiogram.  Ascencion Dike PA-C Interventional Radiology 05/13/2018 1:52 PM

## 2018-05-13 NOTE — Progress Notes (Signed)
PROGRESS NOTE    Eric [name]  ALP:[id number] DOB: August 05, 1926 DOA: 05/08/2018 PCP: [name], MD   Brief Narrative:  Eric [name] is a 84 y.o. malewith medical history significant forcoronary artery disease and aortic sclerosis hypertension hyperlipidemia, diabetes, COPD, O2 dependent on 3 L at home, chronic respiratory failure on oxygen, ischemic cardiomyopathy, atrial fibrillation on Eliquis. Patient presented with right upper quadrant pain concerning for acute cholecystitis, but also found to have evidence of bilateral pneumonia and positive blood cultures for E. Coli. Pt admitted for further management.  Assessment & Plan:   Active Problems:   Type 2 diabetes mellitus, controlled (Eric [name])   Hypertensive heart disease with CHF ([place])   COPD mixed type (HCC)   Obstructive sleep apnea   Chronic diastolic heart failure (HCC)   HCAP (healthcare-associated pneumonia)   Obesity (BMI 30-39.9)   Hartmann's pouch of intestine (HCC)   Essential hypertension   Atrial fibrillation with RVR (HCC)   Acute cholecystitis   Pneumonia   Pneumonia due to other specified infectious organisms  E. Coli Bacteremia  Acute Cholecystitis Currently afebrile, with resolved leukocytosis E coli bacteremia likely 2/2 acute cholecystitis.  CT scan, RUQ [place], HIDA concerning for cholecystitis.  Now s/p perc drain placement. - 2/2 blood cx from 12/1 with e. Coli sensitive to ceftriaxone, repeat BC on 12/319 - continue flagyl as well for cholecystitis - General surgery recommendations: perc tube per IR, advance diet - Percutaneous drain per IR - recommending GB drain to remain in place 4-6 weeks unless cholecystectomy done in interim  - schedule APAP for pain, continue dilaudid prn, follow closely  Bilateral pneumonia  Acute Hypoxic Respiratory Failure Seen on CT abdomen/pelvis from admission on 12/1 (of note, at that time, concern for possible empyema on L side) - MRSA PCR negative - His  CXR 12/4, notable for small bilateral pleural effusions - given concern for empyema on abdominal CT at presentation, will follow CT chest  CT chest done on 12/4 showed: Consolidation in the lower lobes bilaterally as well as right middle lobe most compatible with pneumonia - Continue ceftriaxone/flagyl as noted above, may need to upgrade to ?cefepime if no significant improvement  COPD exacerbation Acute on chronic respiratory failure On 3L oxygen at home -Continue Duoneb, steroids  Acute on chronic systolic and diastolic HF (35/08/2990 EF mildly reduced to 45%-50%) - CXR consistent with this - Follow CT scan as noted above - Elevated BNP as well - Held home dose Lasix due to AKI on CKD - Daily weights and strict in and out  AKI on CKD stage III: baseline creatinine around 1.3 - 1.5 Cr increasing, now 1.82 Held home dose lasix for now Daily BMP  Atrial fibrillation with RVR Rate controlled - continue heparin gtt for now, consider resuming eliquis once tolerating orally - continue amiodarone (home dilt on hold), metoprolol  Macrocytic anemia Likely iron deficiency anemia with anemia of chronic disease as well (low iron and percent sat, normal ferritin, folate, b12) Start iron when able  Thrombocytopenia Present since hospital day 2, follow closely  Essential hypertension Metoprolol started as noted above  Diabetes mellitus, type 2 -Continue SSI  Obesity Body mass index is 36.8 kg/m.  Hypokalemia: replace, follow  [place] Palliative consulted  ?Depression No prior hx Psychiatry consulted    DVT prophylaxis: heparin gtt Code Status: DNR Family Communication: none at bedside Disposition Plan: pending further improvement in respiratory status, surgery and IR recs  Consultants:   Surgery  IR  Palliative team  Procedures:  Perc cholecystostomy 12/4 by IR  Antimicrobials:  Anti-infectives (From admission, onward)   Start     Dose/Rate Route  Frequency Ordered Stop   05/10/18 2200  cefTRIAXone (ROCEPHIN) 2 g in sodium chloride 0.9 % 100 mL IVPB     2 g 200 mL/hr over 30 Minutes Intravenous Every 24 hours 05/10/18 1417     05/10/18 0600  vancomycin (VANCOCIN) 1,500 mg in sodium chloride 0.9 % 500 mL IVPB  Status:  Discontinued     1,500 mg 250 mL/hr over 120 Minutes Intravenous Every 48 hours 05/08/18 1240 05/10/18 0851   05/09/18 0630  ceFEPIme (MAXIPIME) 2 g in sodium chloride 0.9 % 100 mL IVPB  Status:  Discontinued     2 g 200 mL/hr over 30 Minutes Intravenous Every 12 hours 05/09/18 0209 05/10/18 1417   05/08/18 2100  vancomycin (VANCOCIN) IVPB 1000 mg/200 mL premix  Status:  Discontinued     1,000 mg 200 mL/hr over 60 Minutes Intravenous  Once 05/08/18 2047 05/08/18 2057   05/08/18 1030  vancomycin (VANCOCIN) IVPB 1000 mg/200 mL premix     1,000 mg 200 mL/hr over 60 Minutes Intravenous  Once 05/08/18 0954 05/08/18 1151   05/08/18 0800  metroNIDAZOLE (FLAGYL) IVPB 500 mg     500 mg 100 mL/hr over 60 Minutes Intravenous Every 8 hours 05/08/18 0637     05/08/18 0700  ceFEPIme (MAXIPIME) 1 g in sodium chloride 0.9 % 100 mL IVPB  Status:  Discontinued     1 g 200 mL/hr over 30 Minutes Intravenous Every 8 hours 05/08/18 0637 05/09/18 0209   05/08/18 0615  vancomycin (VANCOCIN) IVPB 1000 mg/200 mL premix     1,000 mg 200 mL/hr over 60 Minutes Intravenous  Once 05/08/18 0603 05/08/18 0749   05/08/18 0615  piperacillin-tazobactam (ZOSYN) IVPB 3.375 g  Status:  Discontinued     3.375 g 100 mL/hr over 30 Minutes Intravenous  Once 05/08/18 0603 05/08/18 4132     Subjective: Reports he is not doing well, tried re-assuring patient. States "why does all of this have to happen to me".  Still with significant cough and some abdominal tenderness  Objective: Vitals:   05/13/18 0500 05/13/18 0754 05/13/18 1335 05/13/18 1357  BP:   129/79   Pulse:  96 82 83  Resp:  _0 Temp:   97.9 F (36.6 C)   TempSrc:   Oral   SpO2:   95% 100% 99%  Weight: 100.7 kg     Height:        Intake/Output Summary (Last 24 hours) at 05/13/2018 1525 Last data filed at 05/13/2018 1505 Gross per 24 hour  Intake 885.17 ml  Output 1620 ml  Net -734.83 ml   Filed Weights   05/09/18 0043 05/12/18 0500 05/13/18 0500  Weight: 103.4 kg 101.5 kg 100.7 kg    Examination:  General: NAD   Cardiovascular: S1, S2 present  Respiratory: Coarse BS bilaterally  Abdomen: Soft, TTP on RUQ, nondistended, bowel sounds present, ostomy in place, perc drain with sanguinous fluid  Musculoskeletal: No bilateral pedal edema noted  Skin: Normal  Psychiatry: Low mood, crying spells      Data Reviewed: I have personally reviewed following labs and imaging studies  CBC: Recent Labs  Lab 05/09/18 0548  05/10/18 0016 05/10/18 0539 05/11/18 0236 05/12/18 0554 05/13/18 0656  WBC 23.1*   < > 20.8* 18.9* 15.2* 9.4 10.0  NEUTROABS 17.3*  --   --   --   --   --  6.9  HGB 8.3*   < > 7.8* 7.8* 8.2* 8.7* 9.1*  HCT 26.7*   < > 25.6* 26.9* 27.7* 29.2* 30.9*  MCV 100.8*   < > 102.4* 106.3* 102.6* 102.8* 101.3*  PLT 167   < > 137* 126* 128* 117* 141*   < > = values in this interval not displayed.   Basic Metabolic Panel: Recent Labs  Lab 05/08/18 0328 05/09/18 0548 05/10/18 0539 05/11/18 0226 05/11/18 0236 05/12/18 0554 05/13/18 0656  NA 134* 139 142  --  145 145 145  K 3.3* 3.8 3.5  --  3.2* 3.4* 3.0*  CL 96* 101 105  --  106 107 103  CO2 _0 --  _1 GLUCOSE 171* 134* 135*  --  191* 221* 182*  BUN 21 25* 35*  --  51* 63* 69*  CREATININE 1.41* 1.58* 1.67*  --  1.75* 1.79* 1.82*  CALCIUM 9.4 9.0 9.0  --  9.3 9.4 9.2  MG 2.0  --   --  2.3  --  2.6*  --   PHOS 2.4*  --   --   --   --   --   --    GFR: Estimated Creatinine Clearance: 30.6 mL/min (A) (by C-G formula based on SCr of 1.82 mg/dL (H)). Liver Function Tests: Recent Labs  Lab 05/08/18 0328 05/09/18 0548 05/10/18 0539 05/11/18 0236 05/12/18 0554  AST _2 33 16  ALT _3 ALKPHOS 75 77 80 97 89  BILITOT 1.6* 1.3* 1.0 1.1 0.7  PROT 7.5 6.5 6.1* 6.4* 6.8  ALBUMIN 3.6 2.8* 2.7* 2.8* 2.9*   Recent Labs  Lab 05/08/18 0328  LIPASE 20   No results for input(s): AMMONIA in the last 168 hours. Coagulation Profile: Recent Labs  Lab 05/09/18 1152  INR 1.39   Cardiac Enzymes: No results for input(s): CKTOTAL, CKMB, CKMBINDEX, TROPONINI in the last 168 hours. BNP (last 3 results) Recent Labs    10/19/17 1448  PROBNP 473   HbA1C: No results for input(s): HGBA1C in the last 72 hours. CBG: Recent Labs  Lab 05/13/18 0749  GLUCAP 146*   Lipid Profile: No results for input(s): CHOL, HDL, LDLCALC, TRIG, CHOLHDL, LDLDIRECT in the last 72 hours. Thyroid Function Tests: No results for input(s): TSH, T4TOTAL, FREET4, T3FREE, THYROIDAB in the last 72 hours. Anemia Panel: No results for input(s): VITAMINB12, FOLATE, FERRITIN, TIBC, IRON, RETICCTPCT in the last 72 hours. Sepsis Labs: No results for input(s): PROCALCITON, LATICACIDVEN in the last 168 hours.  Recent Results (from the past 240 hour(s))  Blood culture (routine x 2)     Status: Abnormal   Collection Time: 05/08/18  6:09 AM  Result Value Ref Range Status   Specimen Description   Final    BLOOD RIGHT ARM Performed at [facility] [address], [place], [place] [address]    Special Requests   Final    BOTTLES DRAWN AEROBIC AND ANAEROBIC Blood Culture adequate volume Performed at [facility] [address], [place], [place] [address]    Culture  Setup Time   Final    GRAM NEGATIVE RODS IN BOTH AEROBIC AND ANAEROBIC BOTTLES CRITICAL VALUE NOTED.  VALUE IS CONSISTENT WITH PREVIOUSLY REPORTED AND CALLED VALUE.    Culture (A)  Final    ESCHERICHIA COLI SUSCEPTIBILITIES PERFORMED ON PREVIOUS CULTURE WITHIN THE LAST 5 DAYS. Performed at [facility], [place]  [address] [place], [place] [address]    Report Status  05/10/2018 FINAL  Final  Blood culture (routine x 2)     Status: Abnormal   Collection Time: 05/08/18  6:16 AM  Result Value Ref Range Status   Specimen Description   Final    BLOOD LEFT HAND Performed at [facility] [address], [place], [place] [address]    Special Requests   Final    BOTTLES DRAWN AEROBIC AND ANAEROBIC Blood Culture adequate volume Performed at [facility] [address], [place], [place] [address]    Culture  Setup Time   Final    GRAM NEGATIVE RODS IN BOTH AEROBIC AND ANAEROBIC BOTTLES CRITICAL RESULT CALLED TO, READ BACK BY AND VERIFIED WITH: B.GREEN,PHARMD AT 0026 ON 05/09/18 BY G.MCADOO Performed at [facility], [place] [address], [place], [place] [address]    Culture ESCHERICHIA COLI (A)  Final   Report Status 05/10/2018 FINAL  Final   Organism ID, Bacteria ESCHERICHIA COLI  Final      Susceptibility   Escherichia coli - MIC*    AMPICILLIN >=32 RESISTANT Resistant     CEFAZOLIN >=64 RESISTANT Resistant     CEFEPIME <=1 SENSITIVE Sensitive     CEFTAZIDIME <=1 SENSITIVE Sensitive     CEFTRIAXONE <=1 SENSITIVE Sensitive     CIPROFLOXACIN <=0.25 SENSITIVE Sensitive     GENTAMICIN <=1 SENSITIVE Sensitive     IMIPENEM <=0.25 SENSITIVE Sensitive     TRIMETH/SULFA >=320 RESISTANT Resistant     AMPICILLIN/SULBACTAM >=32 RESISTANT Resistant     PIP/TAZO 64 INTERMEDIATE Intermediate     Extended ESBL NEGATIVE Sensitive     * ESCHERICHIA COLI  Blood Culture ID Panel (Reflexed)     Status: Abnormal   Collection Time: 05/08/18  6:16 AM  Result Value Ref Range Status   Enterococcus species NOT DETECTED NOT DETECTED Final   Listeria monocytogenes NOT DETECTED NOT DETECTED Final   Staphylococcus species NOT DETECTED NOT DETECTED Final   Staphylococcus aureus (BCID) NOT DETECTED NOT DETECTED Final   Streptococcus species NOT DETECTED NOT DETECTED Final   Streptococcus agalactiae NOT DETECTED NOT DETECTED Final    Streptococcus pneumoniae NOT DETECTED NOT DETECTED Final   Streptococcus pyogenes NOT DETECTED NOT DETECTED Final   Acinetobacter baumannii NOT DETECTED NOT DETECTED Final   Enterobacteriaceae species DETECTED (A) NOT DETECTED Final    Comment: Enterobacteriaceae represent a large family of gram-negative bacteria, not a single organism. CRITICAL RESULT CALLED TO, READ BACK BY AND VERIFIED WITH: B.GREEN,PHARMD AT 0026 ON 05/09/18 BY G.MCADOO    Enterobacter cloacae complex NOT DETECTED NOT DETECTED Final   Escherichia coli DETECTED (A) NOT DETECTED Final    Comment: CRITICAL RESULT CALLED TO, READ BACK BY AND VERIFIED WITH: B.GREEN,PHARMD AT 0026 ON 05/09/18 BY G.MCADOO    Klebsiella oxytoca NOT DETECTED NOT DETECTED Final   Klebsiella pneumoniae NOT DETECTED NOT DETECTED Final   Proteus species NOT DETECTED NOT DETECTED Final   Serratia marcescens NOT DETECTED NOT DETECTED Final   Carbapenem resistance NOT DETECTED NOT DETECTED Final   Haemophilus influenzae NOT DETECTED NOT DETECTED Final   Neisseria meningitidis NOT DETECTED NOT DETECTED Final   Pseudomonas aeruginosa NOT DETECTED NOT DETECTED Final   Candida albicans NOT DETECTED NOT DETECTED Final   Candida glabrata NOT DETECTED NOT DETECTED Final   Candida krusei NOT DETECTED NOT DETECTED Final   Candida parapsilosis NOT DETECTED NOT DETECTED Final   Candida tropicalis NOT DETECTED NOT  DETECTED Final    Comment: Performed at [facility], [place] [address], [place], [place] [address]  Culture, sputum-assessment     Status: None   Collection Time: 05/09/18  2:09 PM  Result Value Ref Range Status   Specimen Description SPUTUM  Final   Special Requests NONE  Final   Sputum evaluation   Final    THIS SPECIMEN IS ACCEPTABLE FOR SPUTUM CULTURE Performed at [facility], [place] [address] [place], [place] [address]    Report Status 05/09/2018 FINAL  Final  MRSA PCR Screening     Status: None   Collection Time:  05/09/18  2:09 PM  Result Value Ref Range Status   MRSA by PCR NEGATIVE NEGATIVE Final    Comment:        The GeneXpert MRSA Assay (FDA approved for NASAL specimens only), is one component of a comprehensive MRSA colonization surveillance program. It is not intended to diagnose MRSA infection nor to guide or monitor treatment for MRSA infections. Performed at [facility], [place] [address] [place], [place] [address]   Culture, respiratory     Status: None   Collection Time: 05/09/18  2:09 PM  Result Value Ref Range Status   Specimen Description   Final    SPUTUM Performed at [facility] [address], [place], [place] [address]    Special Requests   Final    NONE Reflexed from [id number] Performed at [facility], [place] [address], [place], [place] [address]    Gram Stain   Final    ABUNDANT WBC PRESENT, PREDOMINANTLY PMN RARE GRAM POSITIVE COCCI RARE GRAM NEGATIVE COCCOBACILLI    Culture   Final    RARE Consistent with normal respiratory flora. Performed at [facility], [place] [address] [place], [place] [address]    Report Status 05/11/2018 FINAL  Final  Aerobic/Anaerobic Culture (surgical/deep wound)     Status: None (Preliminary result)   Collection Time: 05/10/18  4:44 PM  Result Value Ref Range Status   Specimen Description   Final    GALL BLADDER Performed at [facility] [address], [place], [place] [address]    Special Requests NONE  Final   Gram Stain   Final    RARE WBC PRESENT, PREDOMINANTLY MONONUCLEAR NO ORGANISMS SEEN    Culture   Final    NO GROWTH 2 DAYS NO ANAEROBES ISOLATED; CULTURE IN PROGRESS FOR 5 DAYS Performed at [facility], [place] [address], [place],  [address]    Report Status PENDING  Incomplete         Radiology Studies: Ct Chest Wo Contrast  Result Date: 05/11/2018 CLINICAL DATA:  Concern for empyema. Chest pain, shortness of breath.  Pleural effusions suspected. EXAM: CT CHEST WITHOUT CONTRAST TECHNIQUE: Multidetector CT imaging of the chest was performed following the standard protocol without IV contrast. COMPARISON:  Chest x-ray 05/11/2018.  Chest CT 06/03/2016. FINDINGS: Cardiovascular: Prior CABG. Cardiomegaly. Diffusely calcified aorta. No aneurysm. Left pacer in place with leads in the right atrium and right ventricle. Mediastinum/Nodes: Borderline sized scattered mediastinal lymph nodes, likely reactive. No axillary or visible hilar adenopathy. Lungs/Pleura: Mild emphysema. Airspace consolidation in both lower lobes as well as the right middle lobe concerning for pneumonia. Small bilateral pleural effusions. Upper Abdomen: Stable low-density lesion in the left hepatic lobe, likely cyst. Musculoskeletal: Chest wall soft tissues are unremarkable. Pacer battery pack in the left chest wall.  IMPRESSION: Consolidation in the lower lobes bilaterally as well as right middle lobe most compatible with pneumonia. Small bilateral pleural effusions. Cardiomegaly.  Prior CABG. Aortic Atherosclerosis (ICD10-I70.0) and Emphysema (ICD10-J43.9). Electronically Signed   By: Rolm Baptise M.D.   On: 05/11/2018 22:30   Dg Swallowing Func-speech Pathology  Result Date: 05/13/2018 Objective Swallowing Evaluation: Type of Study: MBS-Modified Barium Swallow Study  Patient Details Name: DEZMIN KITTELSON MRN: [id number] Date of Birth: 07/18/1926 Today's Date: 05/13/2018 Time: SLP Start Time (ACUTE ONLY): 0900 -SLP Stop Time (ACUTE ONLY): 0925 SLP Time Calculation (min) (ACUTE ONLY): 25 min Past Medical History: Past Medical History: Diagnosis Date . ALLERGIC RHINITIS  . ANEMIA-NOS  . AORTIC SCLEROSIS  . Asthma  . CARDIOMYOPATHY, ISCHEMIC  . CAROTID BRUIT, RIGHT 02/27/2008 . Cataract   surgery . CML (chronic myeloid leukemia) ([place]) 06/26/2015 . COPD  . CORONARY ARTERY DISEASE   a. s/p CABG in 1995 b. DES in 2008, 2009, and most recent in 2012 with DES to SVG-OM .  DIABETES MELLITUS-TYPE II   diet controlled . Diastolic dysfunction, Grade 1 11/24/2014 . Diverticulitis of colon with perforation 11/22/2014 . Diverticulosis  . GERD  . HIATAL HERNIA  . Hx of echocardiogram   Echo (9/15):  Mild LVH, EF 50-55%, no RWMA, Gr 1 DD, MAC, mild LAE. [name] HYPERLIPIDEMIA  . HYPERTENSION  . Hyponatremia 11/22/2014 . IBS (irritable bowel syndrome)  . LACTOSE INTOLERANCE  . OA (osteoarthritis)  . OBESITY  . On home oxygen therapy   "3L all the time" (10/12/2016) . Partial small bowel obstruction ([place])  . PERIPHERAL VASCULAR DISEASE  . Primary hyperparathyroidism ([place])   Lab Results Component Value Date  PTH 150.7* 02/13/2013  CALCIUM 11.0* 02/13/2013  CAION 1.21 03/15/2008   . Prostate cancer ([place])   seed implants 2004 . SICK SINUS/ TACHY-BRADY SYNDROME 09/2007  s/p PPM st judes . Sleep apnea  . SMALL BOWEL OBSTRUCTION 04/18/2009  Qualifier: History of  By: Asa Lente MD, Jannifer Rodney Symptomatic diverticulosis 01/18/2009  Qualifier: Diagnosis of  By: Shane Crutch, Amy S  Past Surgical History: Past Surgical History: Procedure Laterality Date . BACK SURGERY   . Bilateral cataracts   . COLON RESECTION N/A 11/28/2014  Procedure: EXPLORATORY LAPAROTOMY, SIGMOID COLECTOMY WITH COLOSTOMY;  Surgeon: Jackolyn Confer, MD;  Location: [facility];  Service: General;  Laterality: N/A; . COLON SURGERY   . COLONOSCOPY   . CORONARY ARTERY BYPASS GRAFT   . ESOPHAGOGASTRODUODENOSCOPY  multiple . FLEXIBLE SIGMOIDOSCOPY N/A 09/22/2013  Procedure: FLEXIBLE SIGMOIDOSCOPY;  Surgeon: Gatha Mayer, MD;  Location: [facility];  Service: Endoscopy;  Laterality: N/A; . INGUINAL HERNIA REPAIR Bilateral  . IR PERC CHOLECYSTOSTOMY  05/10/2018 . [place] SURGERY  12/2008 . PACEMAKER INSERTION    DDD/[employer]         Last interrogation 2/13  on chart     Pacemaker guideline order Dr Tamala Julian on chart . Partial small bowel obstruction  2009 . PENILE PROSTHESIS PLACEMENT   . PTCA  2008, 2009, 2012  with DES . REMOVAL OF PENILE PROSTHESIS  N/A 04/22/2017  Procedure: EXPLANT OF MULTICOMPONENT PENILE PROSTHESIS;  Surgeon: Irine Seal, MD;  Location: [facility];  Service: Urology;  Laterality: N/A; . TOTAL HIP ARTHROPLASTY  08/21/2011  Procedure: TOTAL HIP ARTHROPLASTY;  Surgeon: Johnn Hai, MD;  Location: [facility];  Service: Orthopedics;  Laterality: Right; HPI: CAMARION WEIER is a 84 y.o. male with medical history significant for coronary artery disease and aortic sclerosis hypertension  hyperlipidemia, diabetes, COPD, O2 dependent on 3 L at home, chronic respiratory failure on oxygen, ischemic cardiomyopathy, atrial fibrillation on Eliquis. Patient presented with right upper quadrant pain, dx with acute cholecystitis s/p cholecystectomy 12/4. Also found to have evidence of bilateral pneumonia and positive blood cultures for E. Coli.  Subjective: pt awake in bed Assessment / Plan / Recommendation CHL IP CLINICAL IMPRESSIONS 05/13/2018 Clinical Impression Pt presents with overall functional oropharyngeal swallow function with no aspiration or penetration.  Slight decreased organization in oral transiting and piecemealing noted.  In addition, mild pharyngeal residuals of liquids noted were decreased/largely cleared with dry swallows *reflexive.  Pt does appear with residuals in esophagus with retrograde propulsion - ? dysmotility.  Barium tablet given with pudding cleared pharynx easily but then lodged in esophagus and did clear into stomach after liquids - appeared to lodge at [place].  Pt admits he has problems with sensing liquids -water- coming back up.  Suspect esophageal dysphagia is largest aspiration risk due to backflow.  Note GOC meeting to take place and suspect waiting until after meeting to determine indication for esophageal workup.  Using live video, pt educated to findings/recommendations.  Re oropharyngeal swallow, he is appropriate for a dys3 (pt edentulous) thin diet.  Will follow up for pt/family education.  Thanks.   SLP Visit Diagnosis  Dysphagia, unspecified (R13.10) Attention and concentration deficit following -- Frontal lobe and executive function deficit following -- Impact on safety and function Moderate aspiration risk   CHL IP TREATMENT RECOMMENDATION 05/13/2018 Treatment Recommendations Therapy as outlined in treatment plan below   Prognosis 05/13/2018 Prognosis for Safe Diet Advancement Fair Barriers to Reach Goals Time post onset Barriers/Prognosis Comment -- CHL IP DIET RECOMMENDATION 05/13/2018 SLP Diet Recommendations Dysphagia 3 (Mech soft) solids;Thin liquid Liquid Administration via Cup Medication Administration Crushed with puree Compensations Slow rate;Small sips/bites;Follow solids with liquid;Other (Comment) Postural Changes Remain semi-upright after after feeds/meals (Comment);Seated upright at 90 degrees   CHL IP OTHER RECOMMENDATIONS 05/13/2018 Recommended Consults Consider esophageal assessment Oral Care Recommendations Oral care BID Other Recommendations --   CHL IP FOLLOW UP RECOMMENDATIONS 05/12/2018 Follow up Recommendations (No Data)   CHL IP FREQUENCY AND DURATION 05/13/2018 Speech Therapy Frequency (ACUTE ONLY) min 1 x/week Treatment Duration 1 week      CHL IP ORAL PHASE 05/13/2018 Oral Phase WFL Oral - Pudding Teaspoon -- Oral - Pudding Cup -- Oral - Honey Teaspoon -- Oral - Honey Cup -- Oral - Nectar Teaspoon -- Oral - Nectar Cup -- Oral - Nectar Straw -- Oral - Thin Teaspoon -- Oral - Thin Cup -- Oral - Thin Straw -- Oral - Puree -- Oral - Mech Soft -- Oral - Regular -- Oral - Multi-Consistency -- Oral - Pill -- Oral Phase - Comment piecemealing noted which was functional for pt  CHL IP PHARYNGEAL PHASE 05/13/2018 Pharyngeal Phase WFL Pharyngeal- Pudding Teaspoon -- Pharyngeal -- Pharyngeal- Pudding Cup -- Pharyngeal -- Pharyngeal- Honey Teaspoon -- Pharyngeal -- Pharyngeal- Honey Cup -- Pharyngeal -- Pharyngeal- Nectar Teaspoon -- Pharyngeal -- Pharyngeal- Nectar Cup -- Pharyngeal -- Pharyngeal- Nectar Straw --  Pharyngeal -- Pharyngeal- Thin Teaspoon -- Pharyngeal -- Pharyngeal- Thin Cup -- Pharyngeal -- Pharyngeal- Thin Straw -- Pharyngeal -- Pharyngeal- Puree -- Pharyngeal -- Pharyngeal- Mechanical Soft -- Pharyngeal -- Pharyngeal- Regular -- Pharyngeal -- Pharyngeal- Multi-consistency -- Pharyngeal -- Pharyngeal- Pill -- Pharyngeal -- Pharyngeal Comment mild residuals in pharynx largely cleared with dry swallows *reflexive, pt with decreased sustained laryngeal closure with thin swallows but this  did not result in laryngeal penetration or aspiration  CHL IP CERVICAL ESOPHAGEAL PHASE 05/13/2018 Cervical Esophageal Phase Impaired Pudding Teaspoon -- Pudding Cup -- Honey Teaspoon -- Honey Cup -- Nectar Teaspoon -- Nectar Cup -- Nectar Straw -- Thin Teaspoon -- Thin Cup -- Thin Straw -- Puree -- Mechanical Soft -- Regular -- Multi-consistency -- Pill -- Cervical Esophageal Comment appearance of minimal residuals mixed with secretions at pyriform sinus Macario Golds 05/13/2018, 9:45 AM  Luanna Salk, MS [facility] Pager [phone] Office [phone]                  Scheduled Meds: . acetaminophen  1,000 mg Oral Q8H  . amiodarone  200 mg Oral BID  . budesonide  0.25 mg Nebulization BID  . chlorhexidine  15 mL Mouth Rinse BID  . guaiFENesin  1,200 mg Oral BID  . insulin aspart  0-9 Units Subcutaneous TID WC  . ipratropium-albuterol  3 mL Nebulization Q6H  . mouth rinse  15 mL Mouth Rinse q12n4p  . metoprolol tartrate  12.5 mg Oral BID  . pantoprazole  40 mg Oral QHS  . predniSONE  40 mg Oral Q breakfast  . sodium chloride flush  5 mL Intracatheter Q12H   Continuous Infusions: . sodium chloride 1,000 mL (05/13/18 1235)  . cefTRIAXone (ROCEPHIN)  IV 2 g (05/12/18 2105)  . heparin 1,350 Units/hr (05/13/18 0846)  . metronidazole 500 mg (05/13/18 0945)     LOS: 5 days     Alma Friendly, MD [facility]  If 7PM-7AM, please contact night-coverage 05/13/2018,  3:25 PM

## 2018-05-14 DIAGNOSIS — F4321 Adjustment disorder with depressed mood: Secondary | ICD-10-CM

## 2018-05-14 LAB — BASIC METABOLIC PANEL
Anion gap: 12 (ref 5–15)
BUN: 60 mg/dL — ABNORMAL HIGH (ref 8–23)
CO2: 24 mmol/L (ref 22–32)
Calcium: 9 mg/dL (ref 8.9–10.3)
Chloride: 104 mmol/L (ref 98–111)
Creatinine, Ser: 1.64 mg/dL — ABNORMAL HIGH (ref 0.61–1.24)
GFR calc Af Amer: 42 mL/min — ABNORMAL LOW (ref >60)
GFR calc non Af Amer: 37 mL/min — ABNORMAL LOW (ref >60)
Glucose, Bld: 167 mg/dL — ABNORMAL HIGH (ref 70–99)
POTASSIUM: 3.5 mmol/L (ref 3.5–5.1)
Sodium: 140 mmol/L (ref 135–145)

## 2018-05-14 LAB — CBC WITH DIFFERENTIAL/PLATELET
Abs Immature Granulocytes: 1.97 10*3/uL — ABNORMAL HIGH (ref 0.00–0.07)
Basophils Absolute: 0.1 10*3/uL (ref 0.0–0.1)
Basophils Relative: 1 %
EOS PCT: 0 %
Eosinophils Absolute: 0 10*3/uL (ref 0.0–0.5)
HCT: 31.5 % — ABNORMAL LOW (ref 39.0–52.0)
Hemoglobin: 9.1 g/dL — ABNORMAL LOW (ref 13.0–17.0)
Immature Granulocytes: 19 %
Lymphocytes Relative: 6 %
Lymphs Abs: 0.7 10*3/uL (ref 0.7–4.0)
MCH: 30.1 pg (ref 26.0–34.0)
MCHC: 28.9 g/dL — ABNORMAL LOW (ref 30.0–36.0)
MCV: 104.3 fL — ABNORMAL HIGH (ref 80.0–100.0)
MONO ABS: 1.6 10*3/uL — AB (ref 0.1–1.0)
Monocytes Relative: 16 %
Neutro Abs: 6 10*3/uL (ref 1.7–7.7)
Neutrophils Relative %: 58 %
Platelets: 146 10*3/uL — ABNORMAL LOW (ref 150–400)
RBC: 3.02 MIL/uL — ABNORMAL LOW (ref 4.22–5.81)
RDW: 15.2 % (ref 11.5–15.5)
WBC: 10.4 10*3/uL (ref 4.0–10.5)
nRBC: 1.9 % — ABNORMAL HIGH (ref 0.0–0.2)

## 2018-05-14 LAB — GLUCOSE, CAPILLARY
Glucose-Capillary: 142 mg/dL — ABNORMAL HIGH (ref 70–99)
Glucose-Capillary: 157 mg/dL — ABNORMAL HIGH (ref 70–99)
Glucose-Capillary: 164 mg/dL — ABNORMAL HIGH (ref 70–99)
Glucose-Capillary: 204 mg/dL — ABNORMAL HIGH (ref 70–99)
Glucose-Capillary: 209 mg/dL — ABNORMAL HIGH (ref 70–99)

## 2018-05-14 LAB — HEPARIN LEVEL (UNFRACTIONATED): Heparin Unfractionated: 0.65 IU/mL (ref 0.30–0.70)

## 2018-05-14 MED ORDER — OXYCODONE HCL 5 MG PO TABS: 5.0000 mg | ORAL_TABLET | ORAL | Status: DC | PRN

## 2018-05-14 MED ORDER — IPRATROPIUM-ALBUTEROL 0.5-2.5 (3) MG/3ML IN SOLN
3.0000 mL | Freq: Three times a day (TID) | RESPIRATORY_TRACT | Status: DC
  Administered 2018-05-15 – 2018-05-19 (×13): 3 mL via RESPIRATORY_TRACT
  Filled 2018-05-14 (×13): qty 3

## 2018-05-14 MED ORDER — APIXABAN 2.5 MG PO TABS
2.5000 mg | ORAL_TABLET | Freq: Two times a day (BID) | ORAL | Status: DC
  Administered 2018-05-14 – 2018-05-19 (×11): 2.5 mg via ORAL
  Filled 2018-05-14 (×11): qty 1

## 2018-05-14 MED ORDER — SERTRALINE HCL 25 MG PO TABS
25.0000 mg | ORAL_TABLET | Freq: Every day | ORAL | Status: DC
  Administered 2018-05-14 – 2018-05-19 (×6): 25 mg via ORAL
  Filled 2018-05-14 (×6): qty 1

## 2018-05-14 NOTE — Consult Note (Signed)
[facility] Consult   Reason for Consult: ''severe depression'' Referring Physician:  Dr. Horris Latino Patient Identification: Eric [name] MRN:  [id number] Principal Diagnosis: Adjustment disorder with depressed mood Diagnosis:  Principal Problem:   Adjustment disorder with depressed mood Active Problems:   Type 2 diabetes mellitus, controlled ([place])   Hypertensive heart disease with CHF ([place])   COPD mixed type ([place])   Obstructive sleep apnea   Chronic diastolic heart failure ([place])   HCAP (healthcare-associated pneumonia)   Obesity (BMI 30-39.9)   Hartmann's pouch of intestine (HCC)   Essential hypertension   Goals of care, counseling/discussion   Atrial fibrillation with RVR (HCC)   Acute cholecystitis   Pneumonia   Pneumonia due to other specified infectious organisms   Total Time spent with patient: 45 minutes  Subjective:   Eric [name] is a 84 y.o. male patient admitted with right upper quadrant pain  HPI:  Patient with medical history significant forcoronary artery disease and aortic sclerosis hypertension hyperlipidemia, diabetes, COPD, O2 dependent on 3 L at home, chronic respiratory failure on oxygen, ischemic cardiomyopathy, atrial fibrillation on Eliquis who was admitted due to right upper quadrant pain. Evaluation revealed evidence of bilateral pneumonia and positive blood cultures for E. Coli. Patient reports that she has been getting sad/depressed due to his multiple medical problem. Patient reports that he is frustrated because his medical condition id not getting better. He also reports inability to sleep because he has not been using his CPAP machine since he was brought to the hospital  but he is relieved that he will be able to sleep with it tonight. Today, patient is calm, cooperative, denies psychosis, delusions, suicidal or homicidal. However, he agrees to a trial of antidepressant.   Past Psychiatric History: none reported  Risk to Self:   denies Risk to Others:  none Prior Inpatient Therapy:  none Prior Outpatient Therapy:  none  Past Medical History:  Past Medical History:  Diagnosis Date  . ALLERGIC RHINITIS   . ANEMIA-NOS   . AORTIC SCLEROSIS   . Asthma   . CARDIOMYOPATHY, ISCHEMIC   . CAROTID BRUIT, RIGHT 02/27/2008  . Cataract    surgery  . CML (chronic myeloid leukemia) ([place]) 06/26/2015  . COPD   . CORONARY ARTERY DISEASE    a. s/p CABG in 1995 b. DES in 2008, 2009, and most recent in 2012 with DES to SVG-OM  . DIABETES MELLITUS-TYPE II    diet controlled  . Diastolic dysfunction, Grade 1 11/24/2014  . Diverticulitis of colon with perforation 11/22/2014  . Diverticulosis   . GERD   . HIATAL HERNIA   . Hx of echocardiogram    Echo (9/15):  Mild LVH, EF 50-55%, no RWMA, Gr 1 DD, MAC, mild LAE.  [name] HYPERLIPIDEMIA   . HYPERTENSION   . Hyponatremia 11/22/2014  . IBS (irritable bowel syndrome)   . LACTOSE INTOLERANCE   . OA (osteoarthritis)   . OBESITY   . On home oxygen therapy    "3L all the time" (10/12/2016)  . Partial small bowel obstruction ([place])   . PERIPHERAL VASCULAR DISEASE   . Primary hyperparathyroidism ([place])    Lab Results Component Value Date  PTH 150.7* 02/13/2013  CALCIUM 11.0* 02/13/2013  CAION 1.21 03/15/2008    . Prostate cancer ([place])    seed implants 2004  . SICK SINUS/ TACHY-BRADY SYNDROME 09/2007   s/p PPM st jud[employer]  . Sleep apnea   . SMALL BOWEL OBSTRUCTION 04/18/2009   Qualifier:  History of  By: Asa Lente MD, Jannifer Rodney Symptomatic diverticulosis 01/18/2009   Qualifier: Diagnosis of  By: Shane Crutch, Amy S     Past Surgical History:  Procedure Laterality Date  . BACK SURGERY    . Bilateral cataracts    . COLON RESECTION N/A 11/28/2014   Procedure: EXPLORATORY LAPAROTOMY, SIGMOID COLECTOMY WITH COLOSTOMY;  Surgeon: Jackolyn Confer, MD;  Location: [facility];  Service: General;  Laterality: N/A;  . COLON SURGERY    . COLONOSCOPY    . CORONARY ARTERY BYPASS GRAFT    .  ESOPHAGOGASTRODUODENOSCOPY  multiple  . FLEXIBLE SIGMOIDOSCOPY N/A 09/22/2013   Procedure: FLEXIBLE SIGMOIDOSCOPY;  Surgeon: Gatha Mayer, MD;  Location: [facility];  Service: Endoscopy;  Laterality: N/A;  . INGUINAL HERNIA REPAIR Bilateral   . IR PERC CHOLECYSTOSTOMY  05/10/2018  . [place] SURGERY  12/2008  . PACEMAKER INSERTION     DDD/[employer]         Last interrogation 2/13  on chart     Pacemaker guideline order Dr Tamala Julian on chart  . Partial small bowel obstruction  2009  . PENILE PROSTHESIS PLACEMENT    . PTCA  2008, 2009, 2012   with DES  . REMOVAL OF PENILE PROSTHESIS N/A 04/22/2017   Procedure: EXPLANT OF MULTICOMPONENT PENILE PROSTHESIS;  Surgeon: Irine Seal, MD;  Location: [facility];  Service: Urology;  Laterality: N/A;  . TOTAL HIP ARTHROPLASTY  08/21/2011   Procedure: TOTAL HIP ARTHROPLASTY;  Surgeon: Johnn Hai, MD;  Location: [facility];  Service: Orthopedics;  Laterality: Right;   Family History:  Family History  Problem Relation Age of Onset  . Hypertension Mother   . Cancer Mother   . Heart attack Unknown   . Heart attack Unknown   . Heart attack Brother   . Colon cancer Neg Hx   . Stroke Neg Hx    Family Psychiatric  History: Social History:  Social History   Substance and Sexual Activity  Alcohol Use No  . Alcohol/week: 0.0 standard drinks     Social History   Substance and Sexual Activity  Drug Use No    Social History   Socioeconomic History  . Marital status: Widowed    Spouse name: Not on file  . Number of children: Not on file  . Years of education: Not on file  . Highest education level: Not on file  Occupational History  . Occupation: retired    Fish farm manager: RETIRED  Social Needs  . Financial resource strain: Somewhat hard  . Food insecurity:    Worry: Patient refused    Inability: Patient refused  . Transportation needs:    Medical: Patient refused    Non-medical: Patient refused  Tobacco Use  . Smoking status: Former  Smoker    Packs/day: 1.00    Years: 25.00    Pack years: 25.00    Types: Cigarettes    Last attempt to quit: 06/08/1994    Years since quitting: 23.9  . Smokeless tobacco: Never Used  Substance and Sexual Activity  . Alcohol use: No    Alcohol/week: 0.0 standard drinks  . Drug use: No  . 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 Physical activity:    Days per week: Patient refused    Minutes per session: Patient refused  . Stress: Not on file  Relationships  . Social connections:  Talks on phone: Patient refused    Gets together: Patient refused    Attends religious service: Patient refused    Active member of club or organization: Patient refused    Attends meetings of clubs or organizations: Patient refused    Relationship status: Patient refused  Other Topics Concern  . Not on file  Social History Narrative  . Not on file   Additional Social History:    Allergies:   Allergies  Allergen Reactions  . Actos [Pioglitazone Hydrochloride] Other (See Comments)    "felt funny, drowsy, and weak":  [name] Buprenorphine Hcl Nausea And Vomiting  . Celebrex [Celecoxib] Other (See Comments)    "felt funny"  . Demerol Palpitations and Other (See Comments)    Increased BP  . Meperidine Palpitations    Other reaction(s): Other (See Comments) Increased BP  . Morphine And Related Nausea And Vomiting  . Ciprofloxacin Other (See Comments)    arthralgia  . Metformin Nausea And Vomiting  . Zocor [Simvastatin] Other (See Comments)    Makes pt very drowsy    Labs:  Results for orders placed or performed during the hospital encounter of 05/08/18 (from the past 48 hour(s))  Heparin level (unfractionated)     Status: Abnormal   Collection Time: 05/12/18  3:44 PM  Result Value Ref Range   Heparin Unfractionated 0.89 (H) 0.30 - 0.70 IU/mL    Comment: (NOTE) If heparin results are below expected values, and  patient dosage has  been confirmed, suggest follow up testing of antithrombin III levels. Performed at [facility], [place] [address] [place], [place] [address]   APTT     Status: Abnormal   Collection Time: 05/12/18  3:44 PM  Result Value Ref Range   aPTT 99 (H) 24 - 36 seconds    Comment:        IF BASELINE aPTT IS ELEVATED, SUGGEST PATIENT RISK ASSESSMENT BE USED TO DETERMINE APPROPRIATE ANTICOAGULANT THERAPY. Performed at [facility], [place] [address] [place], [place] [address]   Heparin level (unfractionated)     Status: Abnormal   Collection Time: 05/13/18  6:56 AM  Result Value Ref Range   Heparin Unfractionated 0.86 (H) 0.30 - 0.70 IU/mL    Comment: (NOTE) If heparin results are below expected values, and patient dosage has  been confirmed, suggest follow up testing of antithrombin III levels. Performed at [facility], [place] [address] [place], [place] [address]   CBC with Differential/Platelet     Status: Abnormal   Collection Time: 05/13/18  6:56 AM  Result Value Ref Range   WBC 10.0 4.0 - 10.5 K/uL    Comment: WHITE COUNT CONFIRMED ON SMEAR   RBC 3.05 (L) 4.22 - 5.81 MIL/uL   Hemoglobin 9.1 (L) 13.0 - 17.0 g/dL   HCT 30.9 (L) 39.0 - 52.0 %   MCV 101.3 (H) 80.0 - 100.0 fL   MCH 29.8 26.0 - 34.0 pg   MCHC 29.4 (L) 30.0 - 36.0 g/dL   RDW 15.1 11.5 - 15.5 %   Platelets 141 (L) 150 - 400 K/uL   nRBC 0.7 (H) 0.0 - 0.2 %   Neutrophils Relative % 69 %   Neutro Abs 6.9 1.7 - 7.7 K/uL   Lymphocytes Relative 5 %   Lymphs Abs 0.5 (L) 0.7 - 4.0 K/uL   Monocytes Relative 15 %   Monocytes Absolute 1.5 (H) 0.1 - 1.0 K/uL   Eosinophils Relative 0 %   Eosinophils  Absolute 0.0 0.0 - 0.5 K/uL   Basophils Relative 0 %   Basophils Absolute 0.0 0.0 - 0.1 K/uL   WBC Morphology MILD LEFT SHIFT (1-5% METAS, OCC MYELO, OCC BANDS)     Comment: TOXIC GRANULATION   RBC Morphology RARE NUCLEATED RBC'S    Immature Granulocytes 11  %   Abs Immature Granulocytes 1.08 (H) 0.00 - 0.07 K/uL    Comment: Performed at [facility], [place] [address] [place], [place] [address]  Basic metabolic panel     Status: Abnormal   Collection Time: 05/13/18  6:56 AM  Result Value Ref Range   Sodium 145 135 - 145 mmol/L   Potassium 3.0 (L) 3.5 - 5.1 mmol/L   Chloride 103 98 - 111 mmol/L   CO2 25 22 - 32 mmol/L   Glucose, Bld 182 (H) 70 - 99 mg/dL   BUN 69 (H) 8 - 23 mg/dL   Creatinine, Ser 1.82 (H) 0.61 - 1.24 mg/dL   Calcium 9.2 8.9 - 10.3 mg/dL   GFR calc non Af Amer 32 (L) >60 mL/min   GFR calc Af Amer 37 (L) >60 mL/min   Anion gap 17 (H) 5 - 15    Comment: Performed at [place] [address] [place], [place] [address]  APTT     Status: Abnormal   Collection Time: 05/13/18  6:56 AM  Result Value Ref Range   aPTT 106 (H) 24 - 36 seconds    Comment:        IF BASELINE aPTT IS ELEVATED, SUGGEST PATIENT RISK ASSESSMENT BE USED TO DETERMINE APPROPRIATE ANTICOAGULANT THERAPY. Performed at [facility], [place] [address], [place], [place] [address]   Glucose, capillary     Status: Abnormal   Collection Time: 05/13/18  7:49 AM  Result Value Ref Range   Glucose-Capillary 146 (H) 70 - 99 mg/dL  Glucose, capillary     Status: Abnormal   Collection Time: 05/13/18 11:59 AM  Result Value Ref Range   Glucose-Capillary 222 (H) 70 - 99 mg/dL  Glucose, capillary     Status: Abnormal   Collection Time: 05/13/18  3:52 PM  Result Value Ref Range   Glucose-Capillary 208 (H) 70 - 99 mg/dL  Heparin level (unfractionated)     Status: None   Collection Time: 05/13/18  5:06 PM  Result Value Ref Range   Heparin Unfractionated 0.69 0.30 - 0.70 IU/mL    Comment: (NOTE) If heparin results are below expected values, and patient dosage has  been confirmed, suggest follow up testing of antithrombin III levels. Performed at [facility], [place] [address], [place], [place] [address]   APTT     Status: Abnormal   Collection Time: 05/13/18  5:06 PM  Result Value Ref Range   aPTT 91 (H) 24 - 36 seconds    Comment:        IF BASELINE aPTT IS ELEVATED, SUGGEST PATIENT RISK ASSESSMENT BE USED TO DETERMINE APPROPRIATE ANTICOAGULANT THERAPY. Performed at [facility], [place] [address], [place],  [address]   Glucose, capillary     Status: Abnormal   Collection Time: 05/14/18 12:38 AM  Result Value Ref Range   Glucose-Capillary 204 (H) 70 - 99 mg/dL   Comment 1 Notify RN   Heparin level (unfractionated)     Status: None   Collection Time: 05/14/18  5:49 AM  Result Value Ref Range   Heparin Unfractionated 0.65 0.30 - 0.70  IU/mL    Comment: (NOTE) If heparin results are below expected values, and patient dosage has  been confirmed, suggest follow up testing of antithrombin III levels. Performed at [facility], [place] [address] [place], [place] [address]   CBC with Differential/Platelet     Status: Abnormal   Collection Time: 05/14/18  5:49 AM  Result Value Ref Range   WBC 10.4 4.0 - 10.5 K/uL   RBC 3.02 (L) 4.22 - 5.81 MIL/uL   Hemoglobin 9.1 (L) 13.0 - 17.0 g/dL   HCT 31.5 (L) 39.0 - 52.0 %   MCV 104.3 (H) 80.0 - 100.0 fL   MCH 30.1 26.0 - 34.0 pg   MCHC 28.9 (L) 30.0 - 36.0 g/dL   RDW 15.2 11.5 - 15.5 %   Platelets 146 (L) 150 - 400 K/uL   nRBC 1.9 (H) 0.0 - 0.2 %   Neutrophils Relative % 58 %   Neutro Abs 6.0 1.7 - 7.7 K/uL   Lymphocytes Relative 6 %   Lymphs Abs 0.7 0.7 - 4.0 K/uL   Monocytes Relative 16 %   Monocytes Absolute 1.6 (H) 0.1 - 1.0 K/uL   Eosinophils Relative 0 %   Eosinophils Absolute 0.0 0.0 - 0.5 K/uL   Basophils Relative 1 %   Basophils Absolute 0.1 0.0 - 0.1 K/uL   WBC Morphology MILD LEFT SHIFT (1-5% METAS, OCC MYELO, OCC BANDS)    Immature Granulocytes 19 %   Abs Immature Granulocytes 1.97 (H) 0.00 - 0.07 K/uL    Comment: Performed at [facility], [place] [address], [place], [place] [address]  Basic metabolic panel     Status: Abnormal   Collection Time: 05/14/18  5:49 AM  Result Value Ref Range   Sodium 140 135 - 145 mmol/L   Potassium 3.5 3.5 - 5.1 mmol/L   Chloride 104 98 - 111 mmol/L   CO2 24 22 - 32 mmol/L   Glucose, Bld 167 (H) 70 - 99 mg/dL   BUN 60 (H) 8 - 23 mg/dL   Creatinine, Ser 1.64 (H) 0.61 - 1.24 mg/dL   Calcium 9.0 8.9 - 10.3 mg/dL   GFR calc non Af Amer 37 (L) >60 mL/min   GFR calc Af Amer 42 (L) >60 mL/min   Anion gap 12 5 - 15    Comment: Performed at [facility], [place] [address] [place], [place] [address]  Glucose, capillary     Status: Abnormal   Collection Time: 05/14/18  8:00 AM  Result Value Ref Range   Glucose-Capillary 142 (H) 70 - 99 mg/dL  Glucose, capillary     Status: Abnormal   Collection Time: 05/14/18 11:36 AM  Result Value Ref Range   Glucose-Capillary 164 (H) 70 - 99 mg/dL    Current Facility-Administered Medications  Medication Dose Route Frequency Provider Last Rate Last Dose  . 0.9 %  sodium chloride infusion   Intravenous PRN Cristal Deer, MD 10 mL/hr at 05/13/18 1235 1,000 mL at 05/13/18 1235  . acetaminophen (TYLENOL) tablet 1,000 mg  1,000 mg Oral Q8H Elodia Florence., MD   1,000 mg at 05/14/18 0541  . amiodarone (PACERONE) tablet 200 mg  200 mg Oral BID Mariel Aloe, MD   200 mg at 05/14/18 0942  . apixaban (ELIQUIS) tablet 2.5 mg  2.5 mg Oral BID Polly Cobia, RPH   2.5 mg at 05/14/18 1041  . budesonide (PULMICORT) nebulizer solution 0.25 mg  0.25 mg Nebulization  BID Cristal Deer, MD   0.25 mg at 05/14/18 0814  . cefTRIAXone (ROCEPHIN) 2 g in sodium chloride 0.9 % 100 mL IVPB  2 g Intravenous Q24H Mariel Aloe, MD 200 mL/hr at 05/13/18 2216 2 g at 05/13/18 2216  . chlorhexidine (PERIDEX) 0.12 % solution 15 mL  15 mL Mouth Rinse BID Mariel Aloe, MD   15 mL at 05/14/18 0941  . guaiFENesin (MUCINEX) 12 hr tablet 1,200 mg  1,200 mg  Oral BID Mariel Aloe, MD   1,200 mg at 05/14/18 0942  . HYDROmorphone (DILAUDID) injection 1 mg  1 mg Intravenous Q4H PRN Alma Friendly, MD   1 mg at 05/14/18 0937  . insulin aspart (novoLOG) injection 0-9 Units  0-9 Units Subcutaneous TID WC Alma Friendly, MD   2 Units at 05/14/18 1228  . ipratropium-albuterol (DUONEB) 0.5-2.5 (3) MG/3ML nebulizer solution 3 mL  3 mL Nebulization Q4H PRN Cristal Deer, MD   3 mL at 05/09/18 1732  . ipratropium-albuterol (DUONEB) 0.5-2.5 (3) MG/3ML nebulizer solution 3 mL  3 mL Nebulization Q6H Mariel Aloe, MD   3 mL at 05/14/18 0814  . MEDLINE mouth rinse  15 mL Mouth Rinse q12n4p Mariel Aloe, MD   15 mL at 05/14/18 1228  . metoprolol tartrate (LOPRESSOR) injection 2.5 mg  2.5 mg Intravenous Q6H PRN Mariel Aloe, MD   2.5 mg at 05/11/18 0304  . metoprolol tartrate (LOPRESSOR) tablet 12.5 mg  12.5 mg Oral BID Elodia Florence., MD   12.5 mg at 05/14/18 0942  . metroNIDAZOLE (FLAGYL) IVPB 500 mg  500 mg Intravenous Q8H Reubin Milan, MD 100 mL/hr at 05/14/18 0938 500 mg at 05/14/18 0938  . ondansetron (ZOFRAN) tablet 4 mg  4 mg Oral Q6H PRN Reubin Milan, MD       Or  . ondansetron [facility]) injection 4 mg  4 mg Intravenous Q6H PRN Reubin Milan, MD   4 mg at 05/11/18 1838  . pantoprazole (PROTONIX) EC tablet 40 mg  40 mg Oral QHS Eudelia Bunch, RPH   40 mg at 05/13/18 2155  . sertraline (ZOLOFT) tablet 25 mg  25 mg Oral Daily Chiante Peden, MD      . simethicone (MYLICON) 40 ZM/6.2HU suspension 40 mg  40 mg Oral Q6H PRN Elodia Florence., MD   40 mg at 05/11/18 1627  . sodium chloride flush (NS) 0.9 % injection 5 mL  5 mL Intracatheter Q12H Aletta Edouard, MD   5 mL at 05/14/18 1042    Musculoskeletal: Strength & Muscle Tone: within normal limits Gait & Station: lying in bed Patient leans: N/A  Psychiatric Specialty Exam: Physical Exam  Psychiatric: His speech is normal and behavior is normal.  Judgment and thought content normal. Cognition and memory are normal. He exhibits a depressed mood.    Review of Systems  Constitutional: Positive for malaise/fatigue.  HENT: Negative.   Eyes: Negative.   Skin: Negative.   Endo/Heme/Allergies: Negative.   Psychiatric/Behavioral: Positive for depression. The patient has insomnia.     Blood pressure 116/62, pulse 79, temperature 97.8 F (36.6 C), temperature source Oral, resp. rate (!) 25, height 5' 6"  (1.676 m), weight 101.7 kg, SpO2 100 %.Body mass index is 36.17 kg/m.  General Appearance: Casual  Eye Contact:  Good  Speech:  Clear and Coherent  Volume:  Normal  Mood:  Dysphoric  Affect:  Constricted  Thought Process:  Coherent  Orientation:  Full (Time, Place, and Person)  Thought Content:  Logical  Suicidal Thoughts:  No  Homicidal Thoughts:  No  Memory:  Immediate;   Fair Recent;   Fair Remote;   Good  Judgement:  Intact  Insight:  Fair  Psychomotor Activity:  Normal  Concentration:  Concentration: Fair and Attention Span: Fair  Recall:  [employer] of Knowledge:  Fair  Language:  Good  Akathisia:  No  Handed:  Right  AIMS (if indicated):     Assets:  Communication Skills Desire for Improvement  ADL's:  Marginal  Cognition:  WNL  Sleep:   poor     Treatment Plan Summary: 84 y.o.malewith multiple  Medical problem who denies past history of psychiatric problem. However, he reports that he has been feeling sad because he is dealing with multiple medical issue. Currently, he denies suicidal ideation, intent or plan but agreed to a trial of antibiotic.  Recommendations: -Discontinue Remeron-may worsened congestive heart failure/COPD -Consider Zoloft 25 mg daily for depression. -Patient needs his CPAP machine due to insomnia secondary to sleep apnea. -Patient is cleared by psychiatric service, re-consult as needed.   Disposition: No evidence of imminent risk to self or others at present.   Patient does not meet  criteria for psychiatric inpatient admission. Supportive therapy provided about ongoing stressors.  Corena Pilgrim, MD 05/14/2018 2:21 PM

## 2018-05-14 NOTE — Progress Notes (Addendum)
ANTICOAGULATION CONSULT NOTE - Follow Up Consult  Pharmacy Consult for Heparin >> Eliquis Indication: atrial fibrillation  Allergies  Allergen Reactions  . Actos [Pioglitazone Hydrochloride] Other (See Comments)    "felt funny, drowsy, and weak":  [name] Buprenorphine Hcl Nausea And Vomiting  . Celebrex [Celecoxib] Other (See Comments)    "felt funny"  . Demerol Palpitations and Other (See Comments)    Increased BP  . Meperidine Palpitations    Other reaction(s): Other (See Comments) Increased BP  . Morphine And Related Nausea And Vomiting  . Ciprofloxacin Other (See Comments)    arthralgia  . Metformin Nausea And Vomiting  . Zocor [Simvastatin] Other (See Comments)    Makes pt very drowsy    Patient Measurements: Height: 5\' 6"  (167.6 cm)(3 weeks ago per previous charting) Weight: 224 lb 1.6 oz (101.7 kg) IBW/kg (Calculated) : 63.8 Heparin Dosing Weight: 86.3   Vital Signs: Temp: 98 F (36.7 C) (12/07 0858) Temp Source: Oral (12/07 0858) BP: 104/61 (12/07 0858) Pulse Rate: 101 (12/07 0858)  Labs: Recent Labs    05/12/18 0554 05/12/18 1544 05/13/18 0656 05/13/18 1706 05/14/18 0549  HGB 8.7*  --  9.1*  --  9.1*  HCT 29.2*  --  30.9*  --  31.5*  PLT 117*  --  141*  --  146*  APTT 63* 99* 106* 91*  --   HEPARINUNFRC 0.89* 0.89* 0.86* 0.69 0.65  CREATININE 1.79*  --  1.82*  --  1.64*    Estimated Creatinine Clearance: 34.1 mL/min (A) (by C-G formula based on SCr of 1.64 mg/dL (H)). Assessment: 87 yoM with PMH CAD, HTN, HLD, DM, COPD on 3L O2, Afib on Eliquis, admitted for acute cholecystitis, bilateral PNA, and ACEOPD. Pharmacy to bridge with heparin while off Eliquis.    Baseline INR wnl, aPTT: slightly elevated (recent SQ heparin)  Prior anticoagulation: Eliquis 2.5 mg bid (LD 11/30), ASA 81 daily  Significant events:  Patient reports some chest pain AM 12/7, but w/u reveals more likely related to gallbladder dz - OK to transition back to Eliquis  today  Today, 05/14/2018:  CBC: Hgb low but stable, Plt borderline low  Heparin level therapeutic this AM  No bleeding or infusion issues per nursing  SCr improved somewhat, but not yet back to baseline (~1.1 in May 2019); remains over 1.5  Goal of Therapy: Monitor platelets by anticoagulation protocol: Yes  Plan:  Resume Eliquis 2.5 mg PO bid  Stop heparin with first dose of Eliquis  CBC as needed  Monitor for signs of bleeding or thrombosis  Pharmacy will sign off with resumption of home regimen, following peripherally for SCr changes  Reuel Boom, PharmD, BCPS [phone] 05/14/2018, 10:05 AM

## 2018-05-14 NOTE — Plan of Care (Signed)
Pt did not exhibit any S/S of stress or anxiety this shift.

## 2018-05-14 NOTE — Progress Notes (Signed)
PROGRESS NOTE    Eric [name]  BMW:[id number] DOB: Aug 07, 1926 DOA: 05/08/2018 PCP: [name], MD   Brief Narrative:  BRAIDAN [name] is a 84 y.o. malewith medical history significant forcoronary artery disease and aortic sclerosis hypertension hyperlipidemia, diabetes, COPD, O2 dependent on 3 L at home, chronic respiratory failure on oxygen, ischemic cardiomyopathy, atrial fibrillation on Eliquis. Patient presented with right upper quadrant pain concerning for acute cholecystitis, but also found to have evidence of bilateral pneumonia and positive blood cultures for E. Coli. Pt admitted for further management.  Assessment & Plan:   Principal Problem:   Adjustment disorder with depressed mood Active Problems:   Type 2 diabetes mellitus, controlled ([place])   Hypertensive heart disease with CHF (HCC)   COPD mixed type (HCC)   Obstructive sleep apnea   Chronic diastolic heart failure (HCC)   HCAP (healthcare-associated pneumonia)   Obesity (BMI 30-39.9)   Hartmann's pouch of intestine (HCC)   Essential hypertension   Goals of care, counseling/discussion   Atrial fibrillation with RVR (HCC)   Acute cholecystitis   Pneumonia   Pneumonia due to other specified infectious organisms  E. Coli Bacteremia  Acute Cholecystitis Currently afebrile, with resolved leukocytosis E coli bacteremia likely 2/2 acute cholecystitis.  CT scan, RUQ [place], HIDA concerning for cholecystitis.  Now s/p perc drain placement. - 2/2 blood cx from 12/1 with e. Coli sensitive to ceftriaxone, repeat BC on 12/319 - continue flagyl as well for cholecystitis - General surgery recommendations: perc tube per IR, advance diet - Percutaneous drain per IR - recommending GB drain to remain in place 4-6 weeks unless cholecystectomy done in interim  - schedule APAP for pain, start PO pain management  Bilateral pneumonia  Acute Hypoxic Respiratory Failure Seen on CT abdomen/pelvis from admission on 12/1 (of  note, at that time, concern for possible empyema on L side) - MRSA PCR negative - His CXR 12/4, notable for small bilateral pleural effusions - given concern for empyema on abdominal CT at presentation, will follow CT chest  CT chest done on 12/4 showed: Consolidation in the lower lobes bilaterally as well as right middle lobe most compatible with pneumonia - Continue ceftriaxone/flagyl  COPD exacerbation/OSA Acute on chronic respiratory failure On 3L oxygen at home -Continue Duoneb, completed steroids Start home CPAP  Acute on chronic systolic and diastolic HF (27/07/5364 EF mildly reduced to 45%-50%) - CXR consistent with this - Follow CT scan as noted above - Elevated BNP as well - Held home dose Lasix due to AKI on CKD - Daily weights and strict in and out  AKI on CKD stage III: baseline creatinine around 1.3 - 1.5 Improving Held home dose lasix for now Daily BMP  Atrial fibrillation with RVR Rate controlled Restart home eliquis Continue amiodarone (home dilt on hold), metoprolol  Macrocytic anemia Likely iron deficiency anemia with anemia of chronic disease as well (low iron and percent sat, normal ferritin, folate, b12) Start iron when able  Thrombocytopenia Daily CBC  Essential hypertension Metoprolol started as noted above  Diabetes mellitus, type 2 Continue SSI  Obesity Body mass index is 36.8 kg/m.  Hypokalemia: replace, follow  [facility] consulted, appreciate recs  ?Depression No prior hx Psychiatry consulted, appreciate recs Started pt on zoloft    DVT prophylaxis: Eliquis Code Status: DNR Family Communication: none at bedside Disposition Plan: pending further improvement in respiratory status, surgery and IR recs. Pt eval pending  Consultants:   Surgery  IR  Palliative team  Procedures:  Perc cholecystostomy 12/4 by IR  Antimicrobials:  Anti-infectives (From admission, onward)   Start     Dose/Rate Route Frequency  Ordered Stop   05/10/18 2200  cefTRIAXone (ROCEPHIN) 2 g in sodium chloride 0.9 % 100 mL IVPB     2 g 200 mL/hr over 30 Minutes Intravenous Every 24 hours 05/10/18 1417     05/10/18 0600  vancomycin (VANCOCIN) 1,500 mg in sodium chloride 0.9 % 500 mL IVPB  Status:  Discontinued     1,500 mg 250 mL/hr over 120 Minutes Intravenous Every 48 hours 05/08/18 1240 05/10/18 0851   05/09/18 0630  ceFEPIme (MAXIPIME) 2 g in sodium chloride 0.9 % 100 mL IVPB  Status:  Discontinued     2 g 200 mL/hr over 30 Minutes Intravenous Every 12 hours 05/09/18 0209 05/10/18 1417   05/08/18 2100  vancomycin (VANCOCIN) IVPB 1000 mg/200 mL premix  Status:  Discontinued     1,000 mg 200 mL/hr over 60 Minutes Intravenous  Once 05/08/18 2047 05/08/18 2057   05/08/18 1030  vancomycin (VANCOCIN) IVPB 1000 mg/200 mL premix     1,000 mg 200 mL/hr over 60 Minutes Intravenous  Once 05/08/18 0954 05/08/18 1151   05/08/18 0800  metroNIDAZOLE (FLAGYL) IVPB 500 mg     500 mg 100 mL/hr over 60 Minutes Intravenous Every 8 hours 05/08/18 0637     05/08/18 0700  ceFEPIme (MAXIPIME) 1 g in sodium chloride 0.9 % 100 mL IVPB  Status:  Discontinued     1 g 200 mL/hr over 30 Minutes Intravenous Every 8 hours 05/08/18 0637 05/09/18 0209   05/08/18 0615  vancomycin (VANCOCIN) IVPB 1000 mg/200 mL premix     1,000 mg 200 mL/hr over 60 Minutes Intravenous  Once 05/08/18 0603 05/08/18 0749   05/08/18 0615  piperacillin-tazobactam (ZOSYN) IVPB 3.375 g  Status:  Discontinued     3.375 g 100 mL/hr over 30 Minutes Intravenous  Once 05/08/18 0603 05/08/18 0637     Subjective: Reports feeling better, requesting his CPAP. Today with no crying spells. Denies any new complaints.  Objective: Vitals:   05/14/18 0647 05/14/18 0817 05/14/18 0858 05/14/18 1310  BP:   104/61 116/62  Pulse:   (!) 101 79  Resp:    (!) 25  Temp:   98 F (36.7 C) 97.8 F (36.6 C)  TempSrc:   Oral Oral  SpO2:  96% 100% 100%  Weight: 101.7 kg     Height:         Intake/Output Summary (Last 24 hours) at 05/14/2018 1714 Last data filed at 05/14/2018 0959 Gross per 24 hour  Intake 581.69 ml  Output 1025 ml  Net -443.31 ml   Filed Weights   05/12/18 0500 05/13/18 0500 05/14/18 0647  Weight: 101.5 kg 100.7 kg 101.7 kg    Examination:  General: NAD   Cardiovascular: S1, S2 present  Respiratory: Coarse BS b/l  Abdomen: Soft, TTP on RUQ, nondistended, bowel sounds present, ostomy in place,perc drain with bilious fluid   Musculoskeletal: No bilateral pedal edema noted  Skin: Normal  Psychiatry: Fair mood    Data Reviewed: I have personally reviewed following labs and imaging studies  CBC: Recent Labs  Lab 05/09/18 0548  05/10/18 0539 05/11/18 0236 05/12/18 0554 05/13/18 0656 05/14/18 0549  WBC 23.1*   < > 18.9* 15.2* 9.4 10.0 10.4  NEUTROABS 17.3*  --   --   --   --  6.9 6.0  HGB 8.3*   < >  7.8* 8.2* 8.7* 9.1* 9.1*  HCT 26.7*   < > 26.9* 27.7* 29.2* 30.9* 31.5*  MCV 100.8*   < > 106.3* 102.6* 102.8* 101.3* 104.3*  PLT 167   < > 126* 128* 117* 141* 146*   < > = values in this interval not displayed.   Basic Metabolic Panel: Recent Labs  Lab 05/08/18 0328  05/10/18 0539 05/11/18 0226 05/11/18 0236 05/12/18 0554 05/13/18 0656 05/14/18 0549  NA 134*   < > 142  --  145 145 145 140  K 3.3*   < > 3.5  --  3.2* 3.4* 3.0* 3.5  CL 96*   < > 105  --  106 107 103 104  CO2 28   < > 23  --  _0 GLUCOSE 171*   < > 135*  --  191* 221* 182* 167*  BUN 21   < > 35*  --  51* 63* 69* 60*  CREATININE 1.41*   < > 1.67*  --  1.75* 1.79* 1.82* 1.64*  CALCIUM 9.4   < > 9.0  --  9.3 9.4 9.2 9.0  MG 2.0  --   --  2.3  --  2.6*  --   --   PHOS 2.4*  --   --   --   --   --   --   --    < > = values in this interval not displayed.   GFR: Estimated Creatinine Clearance: 34.1 mL/min (A) (by C-G formula based on SCr of 1.64 mg/dL (H)). Liver Function Tests: Recent Labs  Lab 05/08/18 0328 05/09/18 0548 05/10/18 0539  05/11/18 0236 05/12/18 0554  AST _1 33 16  ALT _2 ALKPHOS 75 77 80 97 89  BILITOT 1.6* 1.3* 1.0 1.1 0.7  PROT 7.5 6.5 6.1* 6.4* 6.8  ALBUMIN 3.6 2.8* 2.7* 2.8* 2.9*   Recent Labs  Lab 05/08/18 0328  LIPASE 20   No results for input(s): AMMONIA in the last 168 hours. Coagulation Profile: Recent Labs  Lab 05/09/18 1152  INR 1.39   Cardiac Enzymes: No results for input(s): CKTOTAL, CKMB, CKMBINDEX, TROPONINI in the last 168 hours. BNP (last 3 results) Recent Labs    10/19/17 1448  PROBNP 473   HbA1C: No results for input(s): HGBA1C in the last 72 hours. CBG: Recent Labs  Lab 05/13/18 1552 05/14/18 0038 05/14/18 0800 05/14/18 1136 05/14/18 1621  GLUCAP 208* 204* 142* 164* 157*   Lipid Profile: No results for input(s): CHOL, HDL, LDLCALC, TRIG, CHOLHDL, LDLDIRECT in the last 72 hours. Thyroid Function Tests: No results for input(s): TSH, T4TOTAL, FREET4, T3FREE, THYROIDAB in the last 72 hours. Anemia Panel: No results for input(s): VITAMINB12, FOLATE, FERRITIN, TIBC, IRON, RETICCTPCT in the last 72 hours. Sepsis Labs: No results for input(s): PROCALCITON, LATICACIDVEN in the last 168 hours.  Recent Results (from the past 240 hour(s))  Blood culture (routine x 2)     Status: Abnormal   Collection Time: 05/08/18  6:09 AM  Result Value Ref Range Status   Specimen Description   Final    BLOOD RIGHT ARM Performed at [facility] [address], [place], [place] [address]    Special Requests   Final    BOTTLES DRAWN AEROBIC AND ANAEROBIC Blood Culture adequate volume Performed at [facility] [address] [place],  [address]    Culture  Setup Time   Final  GRAM NEGATIVE RODS IN BOTH AEROBIC AND ANAEROBIC BOTTLES CRITICAL VALUE NOTED.  VALUE IS CONSISTENT WITH PREVIOUSLY REPORTED AND CALLED VALUE.    Culture (A)  Final    ESCHERICHIA COLI SUSCEPTIBILITIES PERFORMED ON PREVIOUS CULTURE  WITHIN THE LAST 5 DAYS. Performed at [facility], [place] [address] [place], [place] [address]    Report Status 05/10/2018 FINAL  Final  Blood culture (routine x 2)     Status: Abnormal   Collection Time: 05/08/18  6:16 AM  Result Value Ref Range Status   Specimen Description   Final    BLOOD LEFT HAND Performed at [facility] [address], [place], [place] [address]    Special Requests   Final    BOTTLES DRAWN AEROBIC AND ANAEROBIC Blood Culture adequate volume Performed at [facility] [address] [place], [place] [address]    Culture  Setup Time   Final    GRAM NEGATIVE RODS IN BOTH AEROBIC AND ANAEROBIC BOTTLES CRITICAL RESULT CALLED TO, READ BACK BY AND VERIFIED WITH: B.GREEN,PHARMD AT 0026 ON 05/09/18 BY G.MCADOO Performed at [facility], [place] [address], [place], [place] [address]    Culture ESCHERICHIA COLI (A)  Final   Report Status 05/10/2018 FINAL  Final   Organism ID, Bacteria ESCHERICHIA COLI  Final      Susceptibility   Escherichia coli - MIC*    AMPICILLIN >=32 RESISTANT Resistant     CEFAZOLIN >=64 RESISTANT Resistant     CEFEPIME <=1 SENSITIVE Sensitive     CEFTAZIDIME <=1 SENSITIVE Sensitive     CEFTRIAXONE <=1 SENSITIVE Sensitive     CIPROFLOXACIN <=0.25 SENSITIVE Sensitive     GENTAMICIN <=1 SENSITIVE Sensitive     IMIPENEM <=0.25 SENSITIVE Sensitive     TRIMETH/SULFA >=320 RESISTANT Resistant     AMPICILLIN/SULBACTAM >=32 RESISTANT Resistant     PIP/TAZO 64 INTERMEDIATE Intermediate     Extended ESBL NEGATIVE Sensitive     * ESCHERICHIA COLI  Blood Culture ID Panel (Reflexed)     Status: Abnormal   Collection Time: 05/08/18  6:16 AM  Result Value Ref Range Status   Enterococcus species NOT DETECTED NOT DETECTED Final   Listeria monocytogenes NOT DETECTED NOT DETECTED Final   Staphylococcus species NOT DETECTED NOT DETECTED Final   Staphylococcus aureus (BCID) NOT DETECTED NOT DETECTED Final    Streptococcus species NOT DETECTED NOT DETECTED Final   Streptococcus agalactiae NOT DETECTED NOT DETECTED Final   Streptococcus pneumoniae NOT DETECTED NOT DETECTED Final   Streptococcus pyogenes NOT DETECTED NOT DETECTED Final   Acinetobacter baumannii NOT DETECTED NOT DETECTED Final   Enterobacteriaceae species DETECTED (A) NOT DETECTED Final    Comment: Enterobacteriaceae represent a large family of gram-negative bacteria, not a single organism. CRITICAL RESULT CALLED TO, READ BACK BY AND VERIFIED WITH: B.GREEN,PHARMD AT 0026 ON 05/09/18 BY G.MCADOO    Enterobacter cloacae complex NOT DETECTED NOT DETECTED Final   Escherichia coli DETECTED (A) NOT DETECTED Final    Comment: CRITICAL RESULT CALLED TO, READ BACK BY AND VERIFIED WITH: B.GREEN,PHARMD AT 0026 ON 05/09/18 BY G.MCADOO    Klebsiella oxytoca NOT DETECTED NOT DETECTED Final   Klebsiella pneumoniae NOT DETECTED NOT DETECTED Final   Proteus species NOT DETECTED NOT DETECTED Final   Serratia marcescens NOT DETECTED NOT DETECTED Final   Carbapenem resistance NOT DETECTED NOT DETECTED Final   Haemophilus influenzae NOT DETECTED NOT DETECTED Final   Neisseria meningitidis NOT DETECTED NOT DETECTED Final  Pseudomonas aeruginosa NOT DETECTED NOT DETECTED Final   Candida albicans NOT DETECTED NOT DETECTED Final   Candida glabrata NOT DETECTED NOT DETECTED Final   Candida krusei NOT DETECTED NOT DETECTED Final   Candida parapsilosis NOT DETECTED NOT DETECTED Final   Candida tropicalis NOT DETECTED NOT DETECTED Final    Comment: Performed at [facility], [place] [address] [place], [place] [address]  Culture, sputum-assessment     Status: None   Collection Time: 05/09/18  2:09 PM  Result Value Ref Range Status   Specimen Description SPUTUM  Final   Special Requests NONE  Final   Sputum evaluation   Final    THIS SPECIMEN IS ACCEPTABLE FOR SPUTUM CULTURE Performed at [facility], [place] [address],  [place], [place] [address]    Report Status 05/09/2018 FINAL  Final  MRSA PCR Screening     Status: None   Collection Time: 05/09/18  2:09 PM  Result Value Ref Range Status   MRSA by PCR NEGATIVE NEGATIVE Final    Comment:        The GeneXpert MRSA Assay (FDA approved for NASAL specimens only), is one component of a comprehensive MRSA colonization surveillance program. It is not intended to diagnose MRSA infection nor to guide or monitor treatment for MRSA infections. Performed at [facility], [place] [address] [place], [place] [address]   Culture, respiratory     Status: None   Collection Time: 05/09/18  2:09 PM  Result Value Ref Range Status   Specimen Description   Final    SPUTUM Performed at [facility] [address] [place], [place] [address]    Special Requests   Final    NONE Reflexed from [phone] Performed at [facility], [place] [address], [place], [place] [address]    Gram Stain   Final    ABUNDANT WBC PRESENT, PREDOMINANTLY PMN RARE GRAM POSITIVE COCCI RARE GRAM NEGATIVE COCCOBACILLI    Culture   Final    RARE Consistent with normal respiratory flora. Performed at [facility], [place] [address] [place], [place] [address]    Report Status 05/11/2018 FINAL  Final  Aerobic/Anaerobic Culture (surgical/deep wound)     Status: None (Preliminary result)   Collection Time: 05/10/18  4:44 PM  Result Value Ref Range Status   Specimen Description   Final    GALL BLADDER Performed at [facility] [address] [place] [address] [place], [place] [address]    Special Requests NONE  Final   Gram Stain   Final    RARE WBC PRESENT, PREDOMINANTLY MONONUCLEAR NO ORGANISMS SEEN    Culture   Final    NO GROWTH 3 DAYS NO ANAEROBES ISOLATED; CULTURE IN PROGRESS FOR 5 DAYS Performed at [facility], [place] [address] [place], [place] [address]    Report Status PENDING  Incomplete         Radiology  Studies: Dg Swallowing Func-speech Pathology  Result Date: 05/13/2018 Objective Swallowing Evaluation: Type of Study: MBS-Modified Barium Swallow Study  Patient Details Name: SOSAIA PITTINGER MRN: [id number] Date of Birth: 08/14/1926 Today's Date: 05/13/2018 Time: SLP Start Time (ACUTE ONLY): 0900 -SLP Stop Time (ACUTE ONLY): 0925 SLP Time Calculation (min) (ACUTE ONLY): 25 min Past Medical History: Past Medical History: Diagnosis Date . ALLERGIC RHINITIS  . ANEMIA-NOS  . AORTIC SCLEROSIS  . Asthma  . CARDIOMYOPATHY, ISCHEMIC  . CAROTID BRUIT, RIGHT 02/27/2008 . Cataract  surgery . CML (chronic myeloid leukemia) ([place]) 06/26/2015 . COPD  . CORONARY ARTERY DISEASE   a. s/p CABG in 1995 b. DES in 2008, 2009, and most recent in 2012 with DES to SVG-OM . DIABETES MELLITUS-TYPE II   diet controlled . Diastolic dysfunction, Grade 1 11/24/2014 . Diverticulitis of colon with perforation 11/22/2014 . Diverticulosis  . GERD  . HIATAL HERNIA  . Hx of echocardiogram   Echo (9/15):  Mild LVH, EF 50-55%, no RWMA, Gr 1 DD, MAC, mild LAE. [name] HYPERLIPIDEMIA  . HYPERTENSION  . Hyponatremia 11/22/2014 . IBS (irritable bowel syndrome)  . LACTOSE INTOLERANCE  . OA (osteoarthritis)  . OBESITY  . On home oxygen therapy   "3L all the time" (10/12/2016) . Partial small bowel obstruction ([place])  . PERIPHERAL VASCULAR DISEASE  . Primary hyperparathyroidism ([place])   Lab Results Component Value Date  PTH 150.7* 02/13/2013  CALCIUM 11.0* 02/13/2013  CAION 1.21 03/15/2008   . Prostate cancer ([place])   seed implants 2004 . SICK SINUS/ TACHY-BRADY SYNDROME 09/2007  s/p PPM st judes . Sleep apnea  . SMALL BOWEL OBSTRUCTION 04/18/2009  Qualifier: History of  By: Asa Lente MD, Jannifer Rodney Symptomatic diverticulosis 01/18/2009  Qualifier: Diagnosis of  By: Shane Crutch, Amy S  Past Surgical History: Past Surgical History: Procedure Laterality Date . BACK SURGERY   . Bilateral cataracts   . COLON RESECTION N/A 11/28/2014  Procedure: EXPLORATORY LAPAROTOMY, SIGMOID  COLECTOMY WITH COLOSTOMY;  Surgeon: Jackolyn Confer, MD;  Location: [facility];  Service: General;  Laterality: N/A; . COLON SURGERY   . COLONOSCOPY   . CORONARY ARTERY BYPASS GRAFT   . ESOPHAGOGASTRODUODENOSCOPY  multiple . FLEXIBLE SIGMOIDOSCOPY N/A 09/22/2013  Procedure: FLEXIBLE SIGMOIDOSCOPY;  Surgeon: Gatha Mayer, MD;  Location: [facility];  Service: Endoscopy;  Laterality: N/A; . INGUINAL HERNIA REPAIR Bilateral  . IR PERC CHOLECYSTOSTOMY  05/10/2018 . [place] SURGERY  12/2008 . PACEMAKER INSERTION    DDD/[employer]         Last interrogation 2/13  on chart     Pacemaker guideline order Dr Tamala Julian on chart . Partial small bowel obstruction  2009 . PENILE PROSTHESIS PLACEMENT   . PTCA  2008, 2009, 2012  with DES . REMOVAL OF PENILE PROSTHESIS N/A 04/22/2017  Procedure: EXPLANT OF MULTICOMPONENT PENILE PROSTHESIS;  Surgeon: Irine Seal, MD;  Location: [facility];  Service: Urology;  Laterality: N/A; . TOTAL HIP ARTHROPLASTY  08/21/2011  Procedure: TOTAL HIP ARTHROPLASTY;  Surgeon: Johnn Hai, MD;  Location: [facility];  Service: Orthopedics;  Laterality: Right; HPI: ASHBY MOSKAL is a 84 y.o. male with medical history significant for coronary artery disease and aortic sclerosis hypertension hyperlipidemia, diabetes, COPD, O2 dependent on 3 L at home, chronic respiratory failure on oxygen, ischemic cardiomyopathy, atrial fibrillation on Eliquis. Patient presented with right upper quadrant pain, dx with acute cholecystitis s/p cholecystectomy 12/4. Also found to have evidence of bilateral pneumonia and positive blood cultures for E. Coli.  Subjective: pt awake in bed Assessment / Plan / Recommendation CHL IP CLINICAL IMPRESSIONS 05/13/2018 Clinical Impression Pt presents with overall functional oropharyngeal swallow function with no aspiration or penetration.  Slight decreased organization in oral transiting and piecemealing noted.  In addition, mild pharyngeal residuals of liquids noted were decreased/largely  cleared with dry swallows *reflexive.  Pt does appear with residuals in esophagus with retrograde propulsion - ? dysmotility.  Barium tablet given with pudding cleared pharynx easily but then lodged in esophagus and did  clear into stomach after liquids - appeared to lodge at [place].  Pt admits he has problems with sensing liquids -water- coming back up.  Suspect esophageal dysphagia is largest aspiration risk due to backflow.  Note GOC meeting to take place and suspect waiting until after meeting to determine indication for esophageal workup.  Using live video, pt educated to findings/recommendations.  Re oropharyngeal swallow, he is appropriate for a dys3 (pt edentulous) thin diet.  Will follow up for pt/family education.  Thanks.   SLP Visit Diagnosis Dysphagia, unspecified (R13.10) Attention and concentration deficit following -- Frontal lobe and executive function deficit following -- Impact on safety and function Moderate aspiration risk   CHL IP TREATMENT RECOMMENDATION 05/13/2018 Treatment Recommendations Therapy as outlined in treatment plan below   Prognosis 05/13/2018 Prognosis for Safe Diet Advancement Fair Barriers to Reach Goals Time post onset Barriers/Prognosis Comment -- CHL IP DIET RECOMMENDATION 05/13/2018 SLP Diet Recommendations Dysphagia 3 (Mech soft) solids;Thin liquid Liquid Administration via Cup Medication Administration Crushed with puree Compensations Slow rate;Small sips/bites;Follow solids with liquid;Other (Comment) Postural Changes Remain semi-upright after after feeds/meals (Comment);Seated upright at 90 degrees   CHL IP OTHER RECOMMENDATIONS 05/13/2018 Recommended Consults Consider esophageal assessment Oral Care Recommendations Oral care BID Other Recommendations --   CHL IP FOLLOW UP RECOMMENDATIONS 05/12/2018 Follow up Recommendations (No Data)   CHL IP FREQUENCY AND DURATION 05/13/2018 Speech Therapy Frequency (ACUTE ONLY) min 1 x/week Treatment Duration 1 week      CHL IP ORAL PHASE  05/13/2018 Oral Phase WFL Oral - Pudding Teaspoon -- Oral - Pudding Cup -- Oral - Honey Teaspoon -- Oral - Honey Cup -- Oral - Nectar Teaspoon -- Oral - Nectar Cup -- Oral - Nectar Straw -- Oral - Thin Teaspoon -- Oral - Thin Cup -- Oral - Thin Straw -- Oral - Puree -- Oral - Mech Soft -- Oral - Regular -- Oral - Multi-Consistency -- Oral - Pill -- Oral Phase - Comment piecemealing noted which was functional for pt  CHL IP PHARYNGEAL PHASE 05/13/2018 Pharyngeal Phase WFL Pharyngeal- Pudding Teaspoon -- Pharyngeal -- Pharyngeal- Pudding Cup -- Pharyngeal -- Pharyngeal- Honey Teaspoon -- Pharyngeal -- Pharyngeal- Honey Cup -- Pharyngeal -- Pharyngeal- Nectar Teaspoon -- Pharyngeal -- Pharyngeal- Nectar Cup -- Pharyngeal -- Pharyngeal- Nectar Straw -- Pharyngeal -- Pharyngeal- Thin Teaspoon -- Pharyngeal -- Pharyngeal- Thin Cup -- Pharyngeal -- Pharyngeal- Thin Straw -- Pharyngeal -- Pharyngeal- Puree -- Pharyngeal -- Pharyngeal- Mechanical Soft -- Pharyngeal -- Pharyngeal- Regular -- Pharyngeal -- Pharyngeal- Multi-consistency -- Pharyngeal -- Pharyngeal- Pill -- Pharyngeal -- Pharyngeal Comment mild residuals in pharynx largely cleared with dry swallows *reflexive, pt with decreased sustained laryngeal closure with thin swallows but this did not result in laryngeal penetration or aspiration  CHL IP CERVICAL ESOPHAGEAL PHASE 05/13/2018 Cervical Esophageal Phase Impaired Pudding Teaspoon -- Pudding Cup -- Honey Teaspoon -- Honey Cup -- Nectar Teaspoon -- Nectar Cup -- Nectar Straw -- Thin Teaspoon -- Thin Cup -- Thin Straw -- Puree -- Mechanical Soft -- Regular -- Multi-consistency -- Pill -- Cervical Esophageal Comment appearance of minimal residuals mixed with secretions at pyriform sinus Macario Golds 05/13/2018, 9:45 AM  Luanna Salk, MS [facility] SLP Acute Rehab Services Pager [phone] Office [phone]                  Scheduled Meds: . acetaminophen  1,000 mg Oral Q8H  . amiodarone  200 mg Oral BID   . apixaban  2.5 mg Oral BID  . budesonide  0.25 mg Nebulization BID  . chlorhexidine  15 mL Mouth Rinse BID  . guaiFENesin  1,200 mg Oral BID  . insulin aspart  0-9 Units Subcutaneous TID WC  . ipratropium-albuterol  3 mL Nebulization Q6H  . mouth rinse  15 mL Mouth Rinse q12n4p  . metoprolol tartrate  12.5 mg Oral BID  . pantoprazole  40 mg Oral QHS  . sertraline  25 mg Oral Daily  . sodium chloride flush  5 mL Intracatheter Q12H   Continuous Infusions: . sodium chloride 1,000 mL (05/13/18 1235)  . cefTRIAXone (ROCEPHIN)  IV 2 g (05/13/18 2216)  . metronidazole 500 mg (05/14/18 8527)     LOS: 6 days     Alma Friendly, MD [facility]  If 7PM-7AM, please contact night-coverage 05/14/2018, 5:14 PM

## 2018-05-14 NOTE — Progress Notes (Signed)
Patient complaining of "sharp pain" radiating across chest from right to left (patient pointing finger to indicate).  Patient also states " I have had this pain before since coming here, its also in the top of my stomach (patient pointing to right upper abdomen).  Vital signs are stable.  EKG shows atrial fibrillation with a controlled rate.  Dr. Horris Latino notified via text paging system.  Will continue to monitor patient.

## 2018-05-14 NOTE — Progress Notes (Signed)
[name] [id number] Sep 18, 1926  CARE TEAM:  PCP: [name], MD  Outpatient Care Team: Patient Care Team: [name], MD as PCP - General (Internal Medicine) Belva Crome, MD as PCP - Cardiology (Cardiology) Belva Crome, MD as Consulting Physician (Cardiology) Suella Broad, MD as Consulting Physician (Physical Medicine and Rehabilitation) Deboraha Sprang, MD as Consulting Physician (Cardiology) Gatha Mayer, MD as Consulting Physician (Gastroenterology) Irine Seal, MD as Consulting Physician (Urology) Melida Quitter, MD as Consulting Physician (Otolaryngology) Susa Day, MD (Orthopedic Surgery) Deneise Lever, MD (Pulmonary Disease) Carol Ada, MD (Gastroenterology) Allyn Kenner, MD (Dermatology) Duffy, Creola Corn, LCSW as Social Worker (Licensed Clinical Social Worker)  Inpatient Treatment Team: Treatment Team: Attending Provider: Alma Friendly, MD; Consulting Physician: Nolon Nations, MD; Registered Nurse: Nils Flack, RN; Registered Nurse: Mickie Kay, RN; Technician: Sueanne Margarita, NT; Registered Nurse: Yvette Rack, RN; Rounding Team: Redmond Baseman, MD; Technician: Renea Ee, NT; Technician: Heber Toole, NT; Registered Nurse: Raynald Kemp, Junior Jones Bales, RN; Technician: Erline Hau, [place]; Registered Nurse: Campbell Lerner, RN; Nurse Practitioner: Philis Pique, NP; Technician: Loree Fee, NT; Registered Nurse: Hollace Hayward, RN   Problem List:   Active Problems:   Type 2 diabetes mellitus, controlled ([place])   Hypertensive heart disease with CHF ([place])   COPD mixed type ([place])   Obstructive sleep apnea   Chronic diastolic heart failure ([place])   HCAP (healthcare-associated pneumonia)   Obesity (BMI 30-39.9)   Hartmann's pouch of intestine (HCC)   Essential hypertension   Goals of care, counseling/discussion   Atrial fibrillation with RVR (HCC)   Acute cholecystitis   Pneumonia  Pneumonia due to other specified infectious organisms      * No surgery found *      Assessment  A calculus acute cholecystitis improving with IV antibiotics and percutaneous drainage  [facility] Stay = 6 days)  Plan:  RUQ pain - HIDAScan positive - IR Perc Cholecystostomy 05/10/18   - sx from abdomen much improved  -Advance diet.  Will do a dysphagia 3 mechanically softened diet per speech therapy recommendations    Bilateral pneumonia with effusions  Severe depression  -more consolable today.  Palliative care and psychiatric consultations pending.    FEN:   ID: Maxipime 12/1 >>day 2 Flagyl 12/1 >>day 4Vancomycin 12/1 -12/3 Rocephin 12/3 =>>day 4.  Would stop antibiotics after 5 days from drain placement.  Therefore 12/8.  DVT: Heparindrip.  Can transition back to oral anticoagulation  Follow up: TBD.  Most likely will be outpatient drain study and remove gallbladder.  No stones, so hopefully will resolve nonoperatively.  Defer to Dr. Excell Seltzer   20 minutes spent in review, evaluation, examination, counseling, and coordination of care.  More than 50% of that time was spent in counseling.  05/14/2018    Subjective: (Chief complaint)  Denies much pain.  Worried about not getting out of bed.  Worried about missing his CPAP.  Objective:  Vital signs:  Vitals:   05/14/18 0539 05/14/18 0647 05/14/18 0817 05/14/18 0858  BP: 137/68   104/61  Pulse: 84   (!) 101  Resp: (!) 24     Temp: 97.7 F (36.5 C)   98 F (36.7 C)  TempSrc: Oral   Oral  SpO2: 95%  96% 100%  Weight:  101.7 kg    Height:        Last BM Date: 05/14/18  Intake/Output  Yesterday:  12/06 0701 - 12/07 0700 In: 1261.9 [P.O.:75; I.V.:476.9; IV Piggyback:700] Out: 850 [Urine:700; Drains:150] This shift:  Total I/O In: -  Out: 250 [Drains:250]  Bowel function:  Flatus: YES  BM:  No  Drain: Bilious from right upper quadrant gallbladder drain into biliary bag     Physical Exam:  General: Pt awake/alert/oriented x4 in no acute distress Eyes: PERRL, normal EOM.  Sclera clear.  No icterus Neuro: CN II-XII intact w/o focal sensory/motor deficits. Lymph: No head/neck/groin lymphadenopathy Psych:  No delerium/psychosis/paranoia.  Somewhat anxious and rambling but consolable. HENT: Normocephalic, Mucus membranes moist.  No thrush Neck: Supple, No tracheal deviation Chest: No chest wall pain w good excursion CV:  Pulses intact.  Regular rhythm MS: Normal AROM mjr joints.  No obvious deformity  Abdomen: Soft.  Nondistended.  Tenderness at Right upper quadrant drain site only..  No evidence of peritonitis.  No incarcerated hernias.  Ext:  No deformity.  No mjr edema.  No cyanosis Skin: No petechiae / purpura  Results:   Labs: Results for orders placed or performed during the hospital encounter of 05/08/18 (from the past 48 hour(s))  Heparin level (unfractionated)     Status: Abnormal   Collection Time: 05/12/18  3:44 PM  Result Value Ref Range   Heparin Unfractionated 0.89 (H) 0.30 - 0.70 IU/mL    Comment: (NOTE) If heparin results are below expected values, and patient dosage has  been confirmed, suggest follow up testing of antithrombin III levels. Performed at [facility], [place] [address], [place], [place] [address]   APTT     Status: Abnormal   Collection Time: 05/12/18  3:44 PM  Result Value Ref Range   aPTT 99 (H) 24 - 36 seconds    Comment:        IF BASELINE aPTT IS ELEVATED, SUGGEST PATIENT RISK ASSESSMENT BE USED TO DETERMINE APPROPRIATE ANTICOAGULANT THERAPY. Performed at [facility], [place] [address] [place], [place] [address]   Heparin level (unfractionated)     Status: Abnormal   Collection Time: 05/13/18  6:56 AM  Result Value Ref Range   Heparin Unfractionated 0.86 (H) 0.30 - 0.70 IU/mL    Comment: (NOTE) If heparin results are below expected values, and patient dosage has  been  confirmed, suggest follow up testing of antithrombin III levels. Performed at [facility], [place] [address], [place],  [address]   CBC with Differential/Platelet     Status: Abnormal   Collection Time: 05/13/18  6:56 AM  Result Value Ref Range   WBC 10.0 4.0 - 10.5 K/uL    Comment: WHITE COUNT CONFIRMED ON SMEAR   RBC 3.05 (L) 4.22 - 5.81 MIL/uL   Hemoglobin 9.1 (L) 13.0 - 17.0 g/dL   HCT 30.9 (L) 39.0 - 52.0 %   MCV 101.3 (H) 80.0 - 100.0 fL   MCH 29.8 26.0 - 34.0 pg   MCHC 29.4 (L) 30.0 - 36.0 g/dL   RDW 15.1 11.5 - 15.5 %   Platelets 141 (L) 150 - 400 K/uL   nRBC 0.7 (H) 0.0 - 0.2 %   Neutrophils Relative % 69 %   Neutro Abs 6.9 1.7 - 7.7 K/uL   Lymphocytes Relative 5 %   Lymphs Abs 0.5 (L) 0.7 - 4.0 K/uL   Monocytes Relative 15 %   Monocytes Absolute 1.5 (H) 0.1 - 1.0 K/uL   Eosinophils Relative 0 %   Eosinophils Absolute 0.0 0.0 - 0.5 K/uL  Basophils Relative 0 %   Basophils Absolute 0.0 0.0 - 0.1 K/uL   WBC Morphology MILD LEFT SHIFT (1-5% METAS, OCC MYELO, OCC BANDS)     Comment: TOXIC GRANULATION   RBC Morphology RARE NUCLEATED RBC'S    Immature Granulocytes 11 %   Abs Immature Granulocytes 1.08 (H) 0.00 - 0.07 K/uL    Comment: Performed at [facility], [place] [address], [place], [place] [address]  Basic metabolic panel     Status: Abnormal   Collection Time: 05/13/18  6:56 AM  Result Value Ref Range   Sodium 145 135 - 145 mmol/L   Potassium 3.0 (L) 3.5 - 5.1 mmol/L   Chloride 103 98 - 111 mmol/L   CO2 25 22 - 32 mmol/L   Glucose, Bld 182 (H) 70 - 99 mg/dL   BUN 69 (H) 8 - 23 mg/dL   Creatinine, Ser 1.82 (H) 0.61 - 1.24 mg/dL   Calcium 9.2 8.9 - 10.3 mg/dL   GFR calc non Af Amer 32 (L) >60 mL/min   GFR calc Af Amer 37 (L) >60 mL/min   Anion gap 17 (H) 5 - 15    Comment: Performed at [place] [address], [place], [place] [address]  APTT     Status: Abnormal   Collection Time: 05/13/18   6:56 AM  Result Value Ref Range   aPTT 106 (H) 24 - 36 seconds    Comment:        IF BASELINE aPTT IS ELEVATED, SUGGEST PATIENT RISK ASSESSMENT BE USED TO DETERMINE APPROPRIATE ANTICOAGULANT THERAPY. Performed at [facility], [place] [address] [place], [place] [address]   Glucose, capillary     Status: Abnormal   Collection Time: 05/13/18  7:49 AM  Result Value Ref Range   Glucose-Capillary 146 (H) 70 - 99 mg/dL  Glucose, capillary     Status: Abnormal   Collection Time: 05/13/18 11:59 AM  Result Value Ref Range   Glucose-Capillary 222 (H) 70 - 99 mg/dL  Glucose, capillary     Status: Abnormal   Collection Time: 05/13/18  3:52 PM  Result Value Ref Range   Glucose-Capillary 208 (H) 70 - 99 mg/dL  Heparin level (unfractionated)     Status: None   Collection Time: 05/13/18  5:06 PM  Result Value Ref Range   Heparin Unfractionated 0.69 0.30 - 0.70 IU/mL    Comment: (NOTE) If heparin results are below expected values, and patient dosage has  been confirmed, suggest follow up testing of antithrombin III levels. Performed at [facility], [place] [address] [place], [place] [address]   APTT     Status: Abnormal   Collection Time: 05/13/18  5:06 PM  Result Value Ref Range   aPTT 91 (H) 24 - 36 seconds    Comment:        IF BASELINE aPTT IS ELEVATED, SUGGEST PATIENT RISK ASSESSMENT BE USED TO DETERMINE APPROPRIATE ANTICOAGULANT THERAPY. Performed at [facility], [place] [address] [place], [place] [address]   Glucose, capillary     Status: Abnormal   Collection Time: 05/14/18 12:38 AM  Result Value Ref Range   Glucose-Capillary 204 (H) 70 - 99 mg/dL   Comment 1 Notify RN   Heparin level (unfractionated)     Status: None   Collection Time: 05/14/18  5:49 AM  Result Value Ref Range   Heparin Unfractionated 0.65 0.30 - 0.70 IU/mL    Comment: (NOTE) If heparin results  are below expected values, and patient dosage has  been  confirmed, suggest follow up testing of antithrombin III levels. Performed at [facility], [place] [address] [place], [place] [address]   CBC with Differential/Platelet     Status: Abnormal   Collection Time: 05/14/18  5:49 AM  Result Value Ref Range   WBC 10.4 4.0 - 10.5 K/uL   RBC 3.02 (L) 4.22 - 5.81 MIL/uL   Hemoglobin 9.1 (L) 13.0 - 17.0 g/dL   HCT 31.5 (L) 39.0 - 52.0 %   MCV 104.3 (H) 80.0 - 100.0 fL   MCH 30.1 26.0 - 34.0 pg   MCHC 28.9 (L) 30.0 - 36.0 g/dL   RDW 15.2 11.5 - 15.5 %   Platelets 146 (L) 150 - 400 K/uL   nRBC 1.9 (H) 0.0 - 0.2 %   Neutrophils Relative % 58 %   Neutro Abs 6.0 1.7 - 7.7 K/uL   Lymphocytes Relative 6 %   Lymphs Abs 0.7 0.7 - 4.0 K/uL   Monocytes Relative 16 %   Monocytes Absolute 1.6 (H) 0.1 - 1.0 K/uL   Eosinophils Relative 0 %   Eosinophils Absolute 0.0 0.0 - 0.5 K/uL   Basophils Relative 1 %   Basophils Absolute 0.1 0.0 - 0.1 K/uL   WBC Morphology MILD LEFT SHIFT (1-5% METAS, OCC MYELO, OCC BANDS)    Immature Granulocytes 19 %   Abs Immature Granulocytes 1.97 (H) 0.00 - 0.07 K/uL    Comment: Performed at [facility], [place] [address], [place], [place] [address]  Basic metabolic panel     Status: Abnormal   Collection Time: 05/14/18  5:49 AM  Result Value Ref Range   Sodium 140 135 - 145 mmol/L   Potassium 3.5 3.5 - 5.1 mmol/L   Chloride 104 98 - 111 mmol/L   CO2 24 22 - 32 mmol/L   Glucose, Bld 167 (H) 70 - 99 mg/dL   BUN 60 (H) 8 - 23 mg/dL   Creatinine, Ser 1.64 (H) 0.61 - 1.24 mg/dL   Calcium 9.0 8.9 - 10.3 mg/dL   GFR calc non Af Amer 37 (L) >60 mL/min   GFR calc Af Amer 42 (L) >60 mL/min   Anion gap 12 5 - 15    Comment: Performed at [facility], [place] [address], [place], [place] [address]  Glucose, capillary     Status: Abnormal   Collection Time: 05/14/18  8:00 AM  Result Value Ref Range   Glucose-Capillary 142 (H) 70 - 99 mg/dL    Imaging / Studies: Dg Swallowing  Func-speech Pathology  Result Date: 05/13/2018 Objective Swallowing Evaluation: Type of Study: MBS-Modified Barium Swallow Study  Patient Details Name: JEREMIAS BROYHILL MRN: [id number] Date of Birth: Apr 16, 1927 Today's Date: 05/13/2018 Time: SLP Start Time (ACUTE ONLY): 0900 -SLP Stop Time (ACUTE ONLY): 0925 SLP Time Calculation (min) (ACUTE ONLY): 25 min Past Medical History: Past Medical History: Diagnosis Date . ALLERGIC RHINITIS  . ANEMIA-NOS  . AORTIC SCLEROSIS  . Asthma  . CARDIOMYOPATHY, ISCHEMIC  . CAROTID BRUIT, RIGHT 02/27/2008 . Cataract   surgery . CML (chronic myeloid leukemia) ([place]) 06/26/2015 . COPD  . CORONARY ARTERY DISEASE   a. s/p CABG in 1995 b. DES in 2008, 2009, and most recent in 2012 with DES to SVG-OM . DIABETES MELLITUS-TYPE II   diet controlled . Diastolic dysfunction, Grade 1 11/24/2014 . Diverticulitis of colon with perforation 11/22/2014 . Diverticulosis  . GERD  . HIATAL  HERNIA  . Hx of echocardiogram   Echo (9/15):  Mild LVH, EF 50-55%, no RWMA, Gr 1 DD, MAC, mild LAE. [name] HYPERLIPIDEMIA  . HYPERTENSION  . Hyponatremia 11/22/2014 . IBS (irritable bowel syndrome)  . LACTOSE INTOLERANCE  . OA (osteoarthritis)  . OBESITY  . On home oxygen therapy   "3L all the time" (10/12/2016) . Partial small bowel obstruction ([place])  . PERIPHERAL VASCULAR DISEASE  . Primary hyperparathyroidism ([place])   Lab Results Component Value Date  PTH 150.7* 02/13/2013  CALCIUM 11.0* 02/13/2013  CAION 1.21 03/15/2008   . Prostate cancer ([place])   seed implants 2004 . SICK SINUS/ TACHY-BRADY SYNDROME 09/2007  s/p PPM st judes . Sleep apnea  . SMALL BOWEL OBSTRUCTION 04/18/2009  Qualifier: History of  By: Asa Lente MD, Jannifer Rodney Symptomatic diverticulosis 01/18/2009  Qualifier: Diagnosis of  By: Shane Crutch, Amy S  Past Surgical History: Past Surgical History: Procedure Laterality Date . BACK SURGERY   . Bilateral cataracts   . COLON RESECTION N/A 11/28/2014  Procedure: EXPLORATORY LAPAROTOMY, SIGMOID COLECTOMY WITH COLOSTOMY;   Surgeon: Jackolyn Confer, MD;  Location: [facility];  Service: General;  Laterality: N/A; . COLON SURGERY   . COLONOSCOPY   . CORONARY ARTERY BYPASS GRAFT   . ESOPHAGOGASTRODUODENOSCOPY  multiple . FLEXIBLE SIGMOIDOSCOPY N/A 09/22/2013  Procedure: FLEXIBLE SIGMOIDOSCOPY;  Surgeon: Gatha Mayer, MD;  Location: [facility];  Service: Endoscopy;  Laterality: N/A; . INGUINAL HERNIA REPAIR Bilateral  . IR PERC CHOLECYSTOSTOMY  05/10/2018 . [place] SURGERY  12/2008 . PACEMAKER INSERTION    DDD/[employer]         Last interrogation 2/13  on chart     Pacemaker guideline order Dr Tamala Julian on chart . Partial small bowel obstruction  2009 . PENILE PROSTHESIS PLACEMENT   . PTCA  2008, 2009, 2012  with DES . REMOVAL OF PENILE PROSTHESIS N/A 04/22/2017  Procedure: EXPLANT OF MULTICOMPONENT PENILE PROSTHESIS;  Surgeon: Irine Seal, MD;  Location: [facility];  Service: Urology;  Laterality: N/A; . TOTAL HIP ARTHROPLASTY  08/21/2011  Procedure: TOTAL HIP ARTHROPLASTY;  Surgeon: Johnn Hai, MD;  Location: [facility];  Service: Orthopedics;  Laterality: Right; HPI: Eric [name] is a 84 y.o. male with medical history significant for coronary artery disease and aortic sclerosis hypertension hyperlipidemia, diabetes, COPD, O2 dependent on 3 L at home, chronic respiratory failure on oxygen, ischemic cardiomyopathy, atrial fibrillation on Eliquis. Patient presented with right upper quadrant pain, dx with acute cholecystitis s/p cholecystectomy 12/4. Also found to have evidence of bilateral pneumonia and positive blood cultures for E. Coli.  Subjective: pt awake in bed Assessment / Plan / Recommendation CHL IP CLINICAL IMPRESSIONS 05/13/2018 Clinical Impression Pt presents with overall functional oropharyngeal swallow function with no aspiration or penetration.  Slight decreased organization in oral transiting and piecemealing noted.  In addition, mild pharyngeal residuals of liquids noted were decreased/largely cleared with dry swallows  *reflexive.  Pt does appear with residuals in esophagus with retrograde propulsion - ? dysmotility.  Barium tablet given with pudding cleared pharynx easily but then lodged in esophagus and did clear into stomach after liquids - appeared to lodge at [place].  Pt admits he has problems with sensing liquids -water- coming back up.  Suspect esophageal dysphagia is largest aspiration risk due to backflow.  Note GOC meeting to take place and suspect waiting until after meeting to determine indication for esophageal workup.  Using live video, pt educated to findings/recommendations.  Re oropharyngeal  swallow, he is appropriate for a dys3 (pt edentulous) thin diet.  Will follow up for pt/family education.  Thanks.   SLP Visit Diagnosis Dysphagia, unspecified (R13.10) Attention and concentration deficit following -- Frontal lobe and executive function deficit following -- Impact on safety and function Moderate aspiration risk   CHL IP TREATMENT RECOMMENDATION 05/13/2018 Treatment Recommendations Therapy as outlined in treatment plan below   Prognosis 05/13/2018 Prognosis for Safe Diet Advancement Fair Barriers to Reach Goals Time post onset Barriers/Prognosis Comment -- CHL IP DIET RECOMMENDATION 05/13/2018 SLP Diet Recommendations Dysphagia 3 (Mech soft) solids;Thin liquid Liquid Administration via Cup Medication Administration Crushed with puree Compensations Slow rate;Small sips/bites;Follow solids with liquid;Other (Comment) Postural Changes Remain semi-upright after after feeds/meals (Comment);Seated upright at 90 degrees   CHL IP OTHER RECOMMENDATIONS 05/13/2018 Recommended Consults Consider esophageal assessment Oral Care Recommendations Oral care BID Other Recommendations --   CHL IP FOLLOW UP RECOMMENDATIONS 05/12/2018 Follow up Recommendations (No Data)   CHL IP FREQUENCY AND DURATION 05/13/2018 Speech Therapy Frequency (ACUTE ONLY) min 1 x/week Treatment Duration 1 week      CHL IP ORAL PHASE 05/13/2018 Oral Phase WFL Oral -  Pudding Teaspoon -- Oral - Pudding Cup -- Oral - Honey Teaspoon -- Oral - Honey Cup -- Oral - Nectar Teaspoon -- Oral - Nectar Cup -- Oral - Nectar Straw -- Oral - Thin Teaspoon -- Oral - Thin Cup -- Oral - Thin Straw -- Oral - Puree -- Oral - Mech Soft -- Oral - Regular -- Oral - Multi-Consistency -- Oral - Pill -- Oral Phase - Comment piecemealing noted which was functional for pt  CHL IP PHARYNGEAL PHASE 05/13/2018 Pharyngeal Phase WFL Pharyngeal- Pudding Teaspoon -- Pharyngeal -- Pharyngeal- Pudding Cup -- Pharyngeal -- Pharyngeal- Honey Teaspoon -- Pharyngeal -- Pharyngeal- Honey Cup -- Pharyngeal -- Pharyngeal- Nectar Teaspoon -- Pharyngeal -- Pharyngeal- Nectar Cup -- Pharyngeal -- Pharyngeal- Nectar Straw -- Pharyngeal -- Pharyngeal- Thin Teaspoon -- Pharyngeal -- Pharyngeal- Thin Cup -- Pharyngeal -- Pharyngeal- Thin Straw -- Pharyngeal -- Pharyngeal- Puree -- Pharyngeal -- Pharyngeal- Mechanical Soft -- Pharyngeal -- Pharyngeal- Regular -- Pharyngeal -- Pharyngeal- Multi-consistency -- Pharyngeal -- Pharyngeal- Pill -- Pharyngeal -- Pharyngeal Comment mild residuals in pharynx largely cleared with dry swallows *reflexive, pt with decreased sustained laryngeal closure with thin swallows but this did not result in laryngeal penetration or aspiration  CHL IP CERVICAL ESOPHAGEAL PHASE 05/13/2018 Cervical Esophageal Phase Impaired Pudding Teaspoon -- Pudding Cup -- Honey Teaspoon -- Honey Cup -- Nectar Teaspoon -- Nectar Cup -- Nectar Straw -- Thin Teaspoon -- Thin Cup -- Thin Straw -- Puree -- Mechanical Soft -- Regular -- Multi-consistency -- Pill -- Cervical Esophageal Comment appearance of minimal residuals mixed with secretions at pyriform sinus Macario Golds 05/13/2018, 9:45 AM  Luanna Salk, MS [facility] SLP Acute Rehab Services Pager [phone] Office [phone]              Medications / Allergies: per chart  Antibiotics: Anti-infectives (From admission, onward)   Start     Dose/Rate Route  Frequency Ordered Stop   05/10/18 2200  cefTRIAXone (ROCEPHIN) 2 g in sodium chloride 0.9 % 100 mL IVPB     2 g 200 mL/hr over 30 Minutes Intravenous Every 24 hours 05/10/18 1417     05/10/18 0600  vancomycin (VANCOCIN) 1,500 mg in sodium chloride 0.9 % 500 mL IVPB  Status:  Discontinued     1,500 mg 250 mL/hr over 120 Minutes Intravenous Every 48 hours 05/08/18 1240  05/10/18 0851   05/09/18 0630  ceFEPIme (MAXIPIME) 2 g in sodium chloride 0.9 % 100 mL IVPB  Status:  Discontinued     2 g 200 mL/hr over 30 Minutes Intravenous Every 12 hours 05/09/18 0209 05/10/18 1417   05/08/18 2100  vancomycin (VANCOCIN) IVPB 1000 mg/200 mL premix  Status:  Discontinued     1,000 mg 200 mL/hr over 60 Minutes Intravenous  Once 05/08/18 2047 05/08/18 2057   05/08/18 1030  vancomycin (VANCOCIN) IVPB 1000 mg/200 mL premix     1,000 mg 200 mL/hr over 60 Minutes Intravenous  Once 05/08/18 0954 05/08/18 1151   05/08/18 0800  metroNIDAZOLE (FLAGYL) IVPB 500 mg     500 mg 100 mL/hr over 60 Minutes Intravenous Every 8 hours 05/08/18 0637     05/08/18 0700  ceFEPIme (MAXIPIME) 1 g in sodium chloride 0.9 % 100 mL IVPB  Status:  Discontinued     1 g 200 mL/hr over 30 Minutes Intravenous Every 8 hours 05/08/18 0637 05/09/18 0209   05/08/18 0615  vancomycin (VANCOCIN) IVPB 1000 mg/200 mL premix     1,000 mg 200 mL/hr over 60 Minutes Intravenous  Once 05/08/18 0603 05/08/18 0749   05/08/18 0615  piperacillin-tazobactam (ZOSYN) IVPB 3.375 g  Status:  Discontinued     3.375 g 100 mL/hr over 30 Minutes Intravenous  Once 05/08/18 0603 05/08/18 4492        Note: Portions of this report may have been transcribed using voice recognition software. Every effort was made to ensure accuracy; however, inadvertent computerized transcription errors may be present.   Any transcriptional errors that result from this process are unintentional.     Adin Hector, MD, FACS, MASCRS Gastrointestinal and Minimally Invasive  Surgery    [address] [place], [place] [address] [phone] Main / Paging [phone] Fax

## 2018-05-15 DIAGNOSIS — R6520 Severe sepsis without septic shock: Secondary | ICD-10-CM

## 2018-05-15 DIAGNOSIS — A4151 Sepsis due to Escherichia coli [E. coli]: Secondary | ICD-10-CM

## 2018-05-15 DIAGNOSIS — G4733 Obstructive sleep apnea (adult) (pediatric): Secondary | ICD-10-CM

## 2018-05-15 LAB — BASIC METABOLIC PANEL
Anion gap: 8 (ref 5–15)
BUN: 51 mg/dL — ABNORMAL HIGH (ref 8–23)
CO2: 28 mmol/L (ref 22–32)
Calcium: 9.2 mg/dL (ref 8.9–10.3)
Chloride: 104 mmol/L (ref 98–111)
Creatinine, Ser: 1.47 mg/dL — ABNORMAL HIGH (ref 0.61–1.24)
GFR calc Af Amer: 48 mL/min — ABNORMAL LOW (ref >60)
GFR calc non Af Amer: 42 mL/min — ABNORMAL LOW (ref >60)
Glucose, Bld: 169 mg/dL — ABNORMAL HIGH (ref 70–99)
Potassium: 4.5 mmol/L (ref 3.5–5.1)
Sodium: 140 mmol/L (ref 135–145)

## 2018-05-15 LAB — CBC WITH DIFFERENTIAL/PLATELET
Abs Immature Granulocytes: 3.11 10*3/uL — ABNORMAL HIGH (ref 0.00–0.07)
BASOS PCT: 0 %
Basophils Absolute: 0.1 10*3/uL (ref 0.0–0.1)
Eosinophils Absolute: 0 10*3/uL (ref 0.0–0.5)
Eosinophils Relative: 0 %
HCT: 29 % — ABNORMAL LOW (ref 39.0–52.0)
HEMOGLOBIN: 8.7 g/dL — AB (ref 13.0–17.0)
Immature Granulocytes: 22 %
Lymphocytes Relative: 4 %
Lymphs Abs: 0.5 10*3/uL — ABNORMAL LOW (ref 0.7–4.0)
MCH: 30.7 pg (ref 26.0–34.0)
MCHC: 30 g/dL (ref 30.0–36.0)
MCV: 102.5 fL — ABNORMAL HIGH (ref 80.0–100.0)
MONOS PCT: 14 %
Monocytes Absolute: 2 10*3/uL — ABNORMAL HIGH (ref 0.1–1.0)
Neutro Abs: 8.7 10*3/uL — ABNORMAL HIGH (ref 1.7–7.7)
Neutrophils Relative %: 60 %
Platelets: 164 10*3/uL (ref 150–400)
RBC: 2.83 MIL/uL — ABNORMAL LOW (ref 4.22–5.81)
RDW: 15.4 % (ref 11.5–15.5)
WBC: 14.4 10*3/uL — ABNORMAL HIGH (ref 4.0–10.5)
nRBC: 1.2 % — ABNORMAL HIGH (ref 0.0–0.2)

## 2018-05-15 LAB — GLUCOSE, CAPILLARY
Glucose-Capillary: 151 mg/dL — ABNORMAL HIGH (ref 70–99)
Glucose-Capillary: 158 mg/dL — ABNORMAL HIGH (ref 70–99)
Glucose-Capillary: 141 mg/dL — ABNORMAL HIGH (ref 70–99)
Glucose-Capillary: 159 mg/dL — ABNORMAL HIGH (ref 70–99)

## 2018-05-15 NOTE — Progress Notes (Signed)
Patient sat up in chair for approximately an hour this morning.  Patient heard crying, "My bottom hurts so bad.  I want to get back to bed."  Patient 2 person assist with walker returning to bed.  Patient reports being anxious and was tearful returning to bed.   Patient resting in bed and denies any pain.

## 2018-05-15 NOTE — Progress Notes (Signed)
Eric [name] [id number] 1927-01-04  CARE TEAM:  PCP: [name], MD  Outpatient Care Team: Patient Care Team: [name], MD as PCP - General (Internal Medicine) Belva Crome, MD as PCP - Cardiology (Cardiology) Belva Crome, MD as Consulting Physician (Cardiology) Suella Broad, MD as Consulting Physician (Physical Medicine and Rehabilitation) Deboraha Sprang, MD as Consulting Physician (Cardiology) Gatha Mayer, MD as Consulting Physician (Gastroenterology) Irine Seal, MD as Consulting Physician (Urology) Melida Quitter, MD as Consulting Physician (Otolaryngology) Susa Day, MD (Orthopedic Surgery) Deneise Lever, MD (Pulmonary Disease) Carol Ada, MD (Gastroenterology) Allyn Kenner, MD (Dermatology) Duffy, Creola Corn, LCSW as Social Worker (Licensed Clinical Social Worker)  Inpatient Treatment Team: Treatment Team: Attending Provider: Alma Friendly, MD; Consulting Physician: Nolon Nations, MD; Registered Nurse: Nils Flack, RN; Registered Nurse: Mickie Kay, RN; Technician: Sueanne Margarita, NT; Registered Nurse: Yvette Rack, RN; Rounding Team: Redmond Baseman, MD; Technician: Renea Ee, NT; Technician: Heber Kane, [place]; Technician: Erline Hau, [place]; Registered Nurse: Campbell Lerner, RN; Nurse Practitioner: Philis Pique, NP; Technician: Loree Fee, NT; Physical Therapist: Alphonzo Severance, PT; Registered Nurse: Lavona Mound, RN; Technician: Demaris Callander, NT   Problem List:   Principal Problem:   Adjustment disorder with depressed mood Active Problems:   Type 2 diabetes mellitus, controlled ([place])   Hypertensive heart disease with CHF ([place])   COPD mixed type ([place])   Obstructive sleep apnea   Chronic diastolic heart failure ([place])   HCAP (healthcare-associated pneumonia)   Obesity (BMI 30-39.9)   Hartmann's pouch of intestine (HCC)   Essential hypertension   Goals of care,  counseling/discussion   Atrial fibrillation with RVR (HCC)   Acute cholecystitis   Pneumonia   Pneumonia due to other specified infectious organisms      * No surgery found *      Assessment  A calculus acute cholecystitis improving with IV antibiotics and percutaneous drainage  [facility] Stay = 7 days)  Plan:  RUQ pain - HIDAScan positive - IR Perc Cholecystostomy 05/10/18   - sx from abdomen much improved  - Advance diet.  Will do a dysphagia 3 mechanically softened diet per speech therapy recommendations    Bilateral pneumonia with effusions  Severe depression  -more consolable today.  Palliative care and psychiatric consultations pending.    FEN:   ID: Maxipime 12/1 >>day 2 Flagyl 12/1 >>day 4Vancomycin 12/1 -12/3 Rocephin 12/3 =>>day 4.  Would stop antibiotics after 5 days from drain placement.  Therefore 12/8.  DVT: Heparindrip.  Can transition back to oral anticoagulation  Follow up: TBD.  Most likely will be outpatient drain study and remove gallbladder.  No stones, so hopefully will resolve nonoperatively.  Patient wondering if we can proceed with cholecystectomy.  I noted not ideal with him recovering from pneumonia and deconditioned.  Can discuss as an outpatient with Dr. Excell Seltzer   20 minutes spent in review, evaluation, examination, counseling, and coordination of care.  More than 50% of that time was spent in counseling.  05/15/2018    Subjective: (Chief complaint)  Denies much pain. Appetite down but no nausea or vomiting. .  Objective:  Vital signs:  Vitals:   05/15/18 0612 05/15/18 0617 05/15/18 0729 05/15/18 1023  BP:  (!) 148/79    Pulse:  82  92  Resp:  18    Temp:  (!) 97.5 F (36.4 C)    TempSrc:  Oral    SpO2:  97% 95%   Weight: 102.9 kg     Height:        Last BM Date: 05/14/18  Intake/Output   Yesterday:  12/07 0701 - 12/08 0700 In: 598.8 [P.O.:100; I.V.:88.8; IV Piggyback:400] Out: 1800 [Urine:700;  Drains:1100] This shift:  Total I/O In: -  Out: 25 [Urine:25]  Bowel function:  Flatus: YES  BM:  No  Drain: Bilious from right upper quadrant gallbladder drain into biliary bag    Physical Exam:  General: Pt awake/alert/oriented x4 in no acute distress Eyes: PERRL, normal EOM.  Sclera clear.  No icterus Neuro: CN II-XII intact w/o focal sensory/motor deficits. Lymph: No head/neck/groin lymphadenopathy Psych:  No delerium/psychosis/paranoia.  Calm today.  Not anxious.   HENT: Normocephalic, Mucus membranes moist.  No thrush Neck: Supple, No tracheal deviation Chest: No chest wall pain w good excursion CV:  Pulses intact.  Regular rhythm MS: Normal AROM mjr joints.  No obvious deformity  Abdomen: Soft.  Nondistended.  Tenderness at Right upper quadrant drain site only..  No evidence of peritonitis.  No incarcerated hernias.  Ext:  No deformity.  No mjr edema.  No cyanosis Skin: No petechiae / purpura  Results:   Labs: Results for orders placed or performed during the hospital encounter of 05/08/18 (from the past 48 hour(s))  Glucose, capillary     Status: Abnormal   Collection Time: 05/13/18 11:59 AM  Result Value Ref Range   Glucose-Capillary 222 (H) 70 - 99 mg/dL  Glucose, capillary     Status: Abnormal   Collection Time: 05/13/18  3:52 PM  Result Value Ref Range   Glucose-Capillary 208 (H) 70 - 99 mg/dL  Heparin level (unfractionated)     Status: None   Collection Time: 05/13/18  5:06 PM  Result Value Ref Range   Heparin Unfractionated 0.69 0.30 - 0.70 IU/mL    Comment: (NOTE) If heparin results are below expected values, and patient dosage has  been confirmed, suggest follow up testing of antithrombin III levels. Performed at [facility], [place] [address], [place], [place] [address]   APTT     Status: Abnormal   Collection Time: 05/13/18  5:06 PM  Result Value Ref Range   aPTT 91 (H) 24 - 36 seconds    Comment:        IF BASELINE aPTT IS  ELEVATED, SUGGEST PATIENT RISK ASSESSMENT BE USED TO DETERMINE APPROPRIATE ANTICOAGULANT THERAPY. Performed at [facility], [place] [address] [place], [place] [address]   Glucose, capillary     Status: Abnormal   Collection Time: 05/14/18 12:38 AM  Result Value Ref Range   Glucose-Capillary 204 (H) 70 - 99 mg/dL   Comment 1 Notify RN   Heparin level (unfractionated)     Status: None   Collection Time: 05/14/18  5:49 AM  Result Value Ref Range   Heparin Unfractionated 0.65 0.30 - 0.70 IU/mL    Comment: (NOTE) If heparin results are below expected values, and patient dosage has  been confirmed, suggest follow up testing of antithrombin III levels. Performed at [facility], [place] [address], [place], [place] [address]   CBC with Differential/Platelet     Status: Abnormal   Collection Time: 05/14/18  5:49 AM  Result Value Ref Range   WBC 10.4 4.0 - 10.5 K/uL   RBC 3.02 (L) 4.22 - 5.81 MIL/uL   Hemoglobin 9.1 (L) 13.0 - 17.0 g/dL   HCT 31.5 (  L) 39.0 - 52.0 %   MCV 104.3 (H) 80.0 - 100.0 fL   MCH 30.1 26.0 - 34.0 pg   MCHC 28.9 (L) 30.0 - 36.0 g/dL   RDW 15.2 11.5 - 15.5 %   Platelets 146 (L) 150 - 400 K/uL   nRBC 1.9 (H) 0.0 - 0.2 %   Neutrophils Relative % 58 %   Neutro Abs 6.0 1.7 - 7.7 K/uL   Lymphocytes Relative 6 %   Lymphs Abs 0.7 0.7 - 4.0 K/uL   Monocytes Relative 16 %   Monocytes Absolute 1.6 (H) 0.1 - 1.0 K/uL   Eosinophils Relative 0 %   Eosinophils Absolute 0.0 0.0 - 0.5 K/uL   Basophils Relative 1 %   Basophils Absolute 0.1 0.0 - 0.1 K/uL   WBC Morphology MILD LEFT SHIFT (1-5% METAS, OCC MYELO, OCC BANDS)    Immature Granulocytes 19 %   Abs Immature Granulocytes 1.97 (H) 0.00 - 0.07 K/uL    Comment: Performed at [facility], [place] [address], [place], [place] [address]  Basic metabolic panel     Status: Abnormal   Collection Time: 05/14/18  5:49 AM  Result Value Ref Range   Sodium 140 135 - 145 mmol/L    Potassium 3.5 3.5 - 5.1 mmol/L   Chloride 104 98 - 111 mmol/L   CO2 24 22 - 32 mmol/L   Glucose, Bld 167 (H) 70 - 99 mg/dL   BUN 60 (H) 8 - 23 mg/dL   Creatinine, Ser 1.64 (H) 0.61 - 1.24 mg/dL   Calcium 9.0 8.9 - 10.3 mg/dL   GFR calc non Af Amer 37 (L) >60 mL/min   GFR calc Af Amer 42 (L) >60 mL/min   Anion gap 12 5 - 15    Comment: Performed at [facility], [place] [address] [place], [place] [address]  Glucose, capillary     Status: Abnormal   Collection Time: 05/14/18  8:00 AM  Result Value Ref Range   Glucose-Capillary 142 (H) 70 - 99 mg/dL  Glucose, capillary     Status: Abnormal   Collection Time: 05/14/18 11:36 AM  Result Value Ref Range   Glucose-Capillary 164 (H) 70 - 99 mg/dL  Glucose, capillary     Status: Abnormal   Collection Time: 05/14/18  4:21 PM  Result Value Ref Range   Glucose-Capillary 157 (H) 70 - 99 mg/dL  Glucose, capillary     Status: Abnormal   Collection Time: 05/14/18  9:59 PM  Result Value Ref Range   Glucose-Capillary 209 (H) 70 - 99 mg/dL  Basic metabolic panel     Status: Abnormal   Collection Time: 05/15/18  6:02 AM  Result Value Ref Range   Sodium 140 135 - 145 mmol/L   Potassium 4.5 3.5 - 5.1 mmol/L    Comment: DELTA CHECK NOTED SLIGHT HEMOLYSIS    Chloride 104 98 - 111 mmol/L   CO2 28 22 - 32 mmol/L   Glucose, Bld 169 (H) 70 - 99 mg/dL   BUN 51 (H) 8 - 23 mg/dL   Creatinine, Ser 1.47 (H) 0.61 - 1.24 mg/dL   Calcium 9.2 8.9 - 10.3 mg/dL   GFR calc non Af Amer 42 (L) >60 mL/min   GFR calc Af Amer 48 (L) >60 mL/min   Anion gap 8 5 - 15    Comment: Performed at [facility], [place] [address], [place],  [address]  CBC with Differential/Platelet  Status: Abnormal   Collection Time: 05/15/18  6:02 AM  Result Value Ref Range   WBC 14.4 (H) 4.0 - 10.5 K/uL    Comment: WHITE COUNT CONFIRMED ON SMEAR   RBC 2.83 (L) 4.22 - 5.81 MIL/uL   Hemoglobin 8.7 (L) 13.0 - 17.0 g/dL   HCT 29.0 (L) 39.0 -  52.0 %   MCV 102.5 (H) 80.0 - 100.0 fL   MCH 30.7 26.0 - 34.0 pg   MCHC 30.0 30.0 - 36.0 g/dL   RDW 15.4 11.5 - 15.5 %   Platelets 164 150 - 400 K/uL   nRBC 1.2 (H) 0.0 - 0.2 %   Neutrophils Relative % 60 %   Neutro Abs 8.7 (H) 1.7 - 7.7 K/uL   Lymphocytes Relative 4 %   Lymphs Abs 0.5 (L) 0.7 - 4.0 K/uL   Monocytes Relative 14 %   Monocytes Absolute 2.0 (H) 0.1 - 1.0 K/uL   Eosinophils Relative 0 %   Eosinophils Absolute 0.0 0.0 - 0.5 K/uL   Basophils Relative 0 %   Basophils Absolute 0.1 0.0 - 0.1 K/uL   WBC Morphology MILD LEFT SHIFT (1-5% METAS, OCC MYELO, OCC BANDS)    RBC Morphology RNRBC NOTED    Immature Granulocytes 22 %   Abs Immature Granulocytes 3.11 (H) 0.00 - 0.07 K/uL    Comment: Performed at [facility], [place] [address], [place], [place] [address]  Glucose, capillary     Status: Abnormal   Collection Time: 05/15/18  7:43 AM  Result Value Ref Range   Glucose-Capillary 151 (H) 70 - 99 mg/dL    Imaging / Studies: No results found.  Medications / Allergies: per chart  Antibiotics: Anti-infectives (From admission, onward)   Start     Dose/Rate Route Frequency Ordered Stop   05/10/18 2200  cefTRIAXone (ROCEPHIN) 2 g in sodium chloride 0.9 % 100 mL IVPB     2 g 200 mL/hr over 30 Minutes Intravenous Every 24 hours 05/10/18 1417     05/10/18 0600  vancomycin (VANCOCIN) 1,500 mg in sodium chloride 0.9 % 500 mL IVPB  Status:  Discontinued     1,500 mg 250 mL/hr over 120 Minutes Intravenous Every 48 hours 05/08/18 1240 05/10/18 0851   05/09/18 0630  ceFEPIme (MAXIPIME) 2 g in sodium chloride 0.9 % 100 mL IVPB  Status:  Discontinued     2 g 200 mL/hr over 30 Minutes Intravenous Every 12 hours 05/09/18 0209 05/10/18 1417   05/08/18 2100  vancomycin (VANCOCIN) IVPB 1000 mg/200 mL premix  Status:  Discontinued     1,000 mg 200 mL/hr over 60 Minutes Intravenous  Once 05/08/18 2047 05/08/18 2057   05/08/18 1030  vancomycin (VANCOCIN) IVPB 1000 mg/200  mL premix     1,000 mg 200 mL/hr over 60 Minutes Intravenous  Once 05/08/18 0954 05/08/18 1151   05/08/18 0800  metroNIDAZOLE (FLAGYL) IVPB 500 mg     500 mg 100 mL/hr over 60 Minutes Intravenous Every 8 hours 05/08/18 0637     05/08/18 0700  ceFEPIme (MAXIPIME) 1 g in sodium chloride 0.9 % 100 mL IVPB  Status:  Discontinued     1 g 200 mL/hr over 30 Minutes Intravenous Every 8 hours 05/08/18 0637 05/09/18 0209   05/08/18 0615  vancomycin (VANCOCIN) IVPB 1000 mg/200 mL premix     1,000 mg 200 mL/hr over 60 Minutes Intravenous  Once 05/08/18 0603 05/08/18 0749   05/08/18 0615  piperacillin-tazobactam (ZOSYN) IVPB 3.375 g  Status:  Discontinued     3.375 g 100 mL/hr over 30 Minutes Intravenous  Once 05/08/18 0603 05/08/18 4753        Note: Portions of this report may have been transcribed using voice recognition software. Every effort was made to ensure accuracy; however, inadvertent computerized transcription errors may be present.   Any transcriptional errors that result from this process are unintentional.     Adin Hector, MD, FACS, MASCRS Gastrointestinal and Minimally Invasive Surgery    [address] [place], [place] [address] [phone] Main / Paging [phone] Fax

## 2018-05-15 NOTE — Evaluation (Signed)
Physical Therapy Evaluation Patient Details Name: Eric [name] MRN: [id number] DOB: 1926-09-13 Today's Date: 05/15/2018   History of Present Illness  84 yo male admitted from SNF with depressed mood, bil Pna. S/p per chole 12/3. Hx of COPD-O2 dep, CAD, DM, R THA, colostomy  Clinical Impression  On eval, pt required Mod assist +2 for mobility. He was able to take a few ambulatory steps and perform a stand pivot with a RW. He is very weak and fatigues quickly. He is at risk for falls when mobilizing. Will follow during hospital stay. Recommend return to SNF.     Follow Up Recommendations SNF    Equipment Recommendations  None recommended by PT    Recommendations for Other Services       Precautions / Restrictions        Mobility  Bed Mobility Overal bed mobility: Needs Assistance Bed Mobility: Supine to Sit     Supine to sit: Mod assist;HOB elevated     General bed mobility comments: Assist for trunk and LEs. Utilized bedpad for scooting, positioning.   Transfers Overall transfer level: Needs assistance Equipment used: Rolling walker (2 wheeled) Transfers: Sit to/from Stand Sit to Stand: Mod assist;+2 physical assistance;+2 safety/equipment;From elevated surface Stand pivot transfers: Mod assist;+2 physical assistance;+2 safety/equipment       General transfer comment: Assist to rise, stabilize, control descent. VCs safety, technique, hand placement.   Ambulation/Gait Ambulation/Gait assistance: Mod assist;+2 physical assistance;+2 safety/equipment Gait Distance (Feet): 5 Feet Assistive device: Rolling walker (2 wheeled) Gait Pattern/deviations: Decreased step length - left;Decreased step length - right;Decreased stride length;Trunk flexed     General Gait Details: Assist to support/stabilize pt. Close follow with recliner. Pt fatigues very easily/quickly. Remained on [place] O2.   Stairs            Wheelchair Mobility    Modified Rankin (Stroke Patients  Only)       Balance Overall balance assessment: Needs assistance         Standing balance support: Bilateral upper extremity supported Standing balance-Leahy Scale: Poor                               Pertinent Vitals/Pain Pain Assessment: Faces Faces Pain Scale: Hurts little more Pain Location: general discomfort Pain Descriptors / Indicators: Grimacing;Discomfort;Moaning Pain Intervention(s): Limited activity within patient's tolerance;Repositioned    Home Living Family/patient expects to be discharged to:: Skilled nursing facility                      Prior Function Level of Independence: Needs assistance   Gait / Transfers Assistance Needed: ambulatory with RW at SNF  ADL's / Homemaking Assistance Needed: assist for toileting, bathing, dressing        Hand Dominance        Extremity/Trunk Assessment   Upper Extremity Assessment Upper Extremity Assessment: Generalized weakness    Lower Extremity Assessment Lower Extremity Assessment: Generalized weakness    Cervical / Trunk Assessment Cervical / Trunk Assessment: Kyphotic  Communication   Communication: HOH  Cognition Arousal/Alertness: Awake/alert Behavior During Therapy: Anxious Overall Cognitive Status: No family/caregiver present to determine baseline cognitive functioning                                        General Comments      Exercises  Assessment/Plan    PT Assessment Patient needs continued PT services  PT Problem List Decreased strength;Decreased balance;Decreased mobility;Decreased activity tolerance;Decreased knowledge of use of DME;Pain       PT Treatment Interventions DME instruction;Functional mobility training;Balance training;Gait training;Therapeutic activities;Therapeutic exercise    PT Goals (Current goals can be found in the Care Plan section)  Acute Rehab PT Goals Patient Stated Goal: to be able to walk PT Goal Formulation:  With patient Time For Goal Achievement: 05/29/18 Potential to Achieve Goals: Fair    Frequency Min 2X/week   Barriers to discharge        Co-evaluation               AM-PAC PT "6 Clicks" Mobility  Outcome Measure Help needed turning from your back to your side while in a flat bed without using bedrails?: A Lot Help needed moving from lying on your back to sitting on the side of a flat bed without using bedrails?: A Lot Help needed moving to and from a bed to a chair (including a wheelchair)?: A Lot Help needed standing up from a chair using your arms (e.g., wheelchair or bedside chair)?: A Lot Help needed to walk in hospital room?: A Lot Help needed climbing 3-5 steps with a railing? : Total 6 Click Score: 11    End of Session Equipment Utilized During Treatment: Oxygen Activity Tolerance: Patient limited by fatigue Patient left: in chair;with chair alarm set   PT Visit Diagnosis: Muscle weakness (generalized) (M62.81);Difficulty in walking, not elsewhere classified (R26.2);Unsteadiness on feet (R26.81)    Time: 2878-6767 PT Time Calculation (min) (ACUTE ONLY): 14 min   Charges:   PT Evaluation $PT Eval Moderate Complexity: [place], PT Acute Rehabilitation Services Pager: [phone] Office: [phone]

## 2018-05-15 NOTE — Progress Notes (Signed)
PROGRESS NOTE    Eric [name]  BMW:[id number] DOB: Aug 07, 1926 DOA: 05/08/2018 PCP: [name], MD   Brief Narrative:  Eric [name] is a 84 y.o. malewith medical history significant forcoronary artery disease and aortic sclerosis hypertension hyperlipidemia, diabetes, COPD, O2 dependent on 3 L at home, chronic respiratory failure on oxygen, ischemic cardiomyopathy, atrial fibrillation on Eliquis. Patient presented with right upper quadrant pain concerning for acute cholecystitis, but also found to have evidence of bilateral pneumonia and positive blood cultures for E. Coli. Pt admitted for further management.  Assessment & Plan:   Principal Problem:   Adjustment disorder with depressed mood Active Problems:   Type 2 diabetes mellitus, controlled ([place])   Hypertensive heart disease with CHF (HCC)   COPD mixed type (HCC)   Obstructive sleep apnea   Chronic diastolic heart failure (HCC)   HCAP (healthcare-associated pneumonia)   Obesity (BMI 30-39.9)   Hartmann's pouch of intestine (HCC)   Essential hypertension   Goals of care, counseling/discussion   Atrial fibrillation with RVR (HCC)   Acute cholecystitis   Pneumonia   Pneumonia due to other specified infectious organisms  E. Coli Bacteremia  Acute Cholecystitis Currently afebrile, with resolved leukocytosis E coli bacteremia likely 2/2 acute cholecystitis.  CT scan, RUQ [place], HIDA concerning for cholecystitis.  Now s/p perc drain placement. - 2/2 blood cx from 12/1 with e. Coli sensitive to ceftriaxone, repeat BC on 12/319 - continue flagyl as well for cholecystitis - General surgery recommendations: perc tube per IR, advance diet - Percutaneous drain per IR - recommending GB drain to remain in place 4-6 weeks unless cholecystectomy done in interim  - schedule APAP for pain, start PO pain management  Bilateral pneumonia  Acute Hypoxic Respiratory Failure Seen on CT abdomen/pelvis from admission on 12/1 (of  note, at that time, concern for possible empyema on L side) - MRSA PCR negative - His CXR 12/4, notable for small bilateral pleural effusions - given concern for empyema on abdominal CT at presentation, will follow CT chest  CT chest done on 12/4 showed: Consolidation in the lower lobes bilaterally as well as right middle lobe most compatible with pneumonia - Continue ceftriaxone/flagyl  COPD exacerbation/OSA Acute on chronic respiratory failure On 3L oxygen at home -Continue Duoneb, completed steroids Start home CPAP  Acute on chronic systolic and diastolic HF (27/07/5364 EF mildly reduced to 45%-50%) - CXR consistent with this - Follow CT scan as noted above - Elevated BNP as well - Held home dose Lasix due to AKI on CKD - Daily weights and strict in and out  AKI on CKD stage III: baseline creatinine around 1.3 - 1.5 Improving Held home dose lasix for now Daily BMP  Atrial fibrillation with RVR Rate controlled Restart home eliquis Continue amiodarone (home dilt on hold), metoprolol  Macrocytic anemia Likely iron deficiency anemia with anemia of chronic disease as well (low iron and percent sat, normal ferritin, folate, b12) Start iron when able  Thrombocytopenia Daily CBC  Essential hypertension Metoprolol started as noted above  Diabetes mellitus, type 2 Continue SSI  Obesity Body mass index is 36.8 kg/m.  Hypokalemia: replace, follow  [place] Pall[facility] consulted, appreciate recs  ?Depression No prior hx Psychiatry consulted, appreciate recs Started pt on zoloft    DVT prophylaxis: Eliquis Code Status: DNR Family Communication: none at bedside Disposition Plan: pending further improvement in respiratory status, surgery and IR recs. Pt eval pending  Consultants:   Surgery  IR  Palliative team  Procedures:  Perc cholecystostomy 12/4 by IR  Antimicrobials:  Anti-infectives (From admission, onward)   Start     Dose/Rate Route Frequency  Ordered Stop   05/10/18 2200  cefTRIAXone (ROCEPHIN) 2 g in sodium chloride 0.9 % 100 mL IVPB     2 g 200 mL/hr over 30 Minutes Intravenous Every 24 hours 05/10/18 1417     05/10/18 0600  vancomycin (VANCOCIN) 1,500 mg in sodium chloride 0.9 % 500 mL IVPB  Status:  Discontinued     1,500 mg 250 mL/hr over 120 Minutes Intravenous Every 48 hours 05/08/18 1240 05/10/18 0851   05/09/18 0630  ceFEPIme (MAXIPIME) 2 g in sodium chloride 0.9 % 100 mL IVPB  Status:  Discontinued     2 g 200 mL/hr over 30 Minutes Intravenous Every 12 hours 05/09/18 0209 05/10/18 1417   05/08/18 2100  vancomycin (VANCOCIN) IVPB 1000 mg/200 mL premix  Status:  Discontinued     1,000 mg 200 mL/hr over 60 Minutes Intravenous  Once 05/08/18 2047 05/08/18 2057   05/08/18 1030  vancomycin (VANCOCIN) IVPB 1000 mg/200 mL premix     1,000 mg 200 mL/hr over 60 Minutes Intravenous  Once 05/08/18 0954 05/08/18 1151   05/08/18 0800  metroNIDAZOLE (FLAGYL) IVPB 500 mg     500 mg 100 mL/hr over 60 Minutes Intravenous Every 8 hours 05/08/18 0637     05/08/18 0700  ceFEPIme (MAXIPIME) 1 g in sodium chloride 0.9 % 100 mL IVPB  Status:  Discontinued     1 g 200 mL/hr over 30 Minutes Intravenous Every 8 hours 05/08/18 0637 05/09/18 0209   05/08/18 0615  vancomycin (VANCOCIN) IVPB 1000 mg/200 mL premix     1,000 mg 200 mL/hr over 60 Minutes Intravenous  Once 05/08/18 0603 05/08/18 0749   05/08/18 0615  piperacillin-tazobactam (ZOSYN) IVPB 3.375 g  Status:  Discontinued     3.375 g 100 mL/hr over 30 Minutes Intravenous  Once 05/08/18 0603 05/08/18 0637     Subjective: Reports feeling better, requesting his CPAP. Today with no crying spells. Denies any new complaints.  Objective: Vitals:   05/15/18 0617 05/15/18 0729 05/15/18 1023 05/15/18 1212  BP: (!) 148/79   (!) 144/52  Pulse: 82  92 82  Resp: 18   18  Temp: (!) 97.5 F (36.4 C)   97.7 F (36.5 C)  TempSrc: Oral   Oral  SpO2: 97% 95%  97%  Weight:      Height:         Intake/Output Summary (Last 24 hours) at 05/15/2018 1521 Last data filed at 05/15/2018 1236 Gross per 24 hour  Intake 543.78 ml  Output 1875 ml  Net -1331.22 ml   Filed Weights   05/13/18 0500 05/14/18 0647 05/15/18 0612  Weight: 100.7 kg 101.7 kg 102.9 kg    Examination:  General: NAD   Cardiovascular: S1, S2 present  Respiratory: Coarse BS b/l  Abdomen: Soft, very mild TTP on RUQ, nondistended, bowel sounds present, ostomy in place, perc drain with bilious fluid  Musculoskeletal: No bilateral pedal edema noted  Skin: Normal  Psychiatry: Ok mood    Data Reviewed: I have personally reviewed following labs and imaging studies  CBC: Recent Labs  Lab 05/09/18 0548  05/11/18 0236 05/12/18 0554 05/13/18 0656 05/14/18 0549 05/15/18 0602  WBC 23.1*   < > 15.2* 9.4 10.0 10.4 14.4*  NEUTROABS 17.3*  --   --   --  6.9 6.0 8.7*  HGB 8.3*   < >  8.2* 8.7* 9.1* 9.1* 8.7*  HCT 26.7*   < > 27.7* 29.2* 30.9* 31.5* 29.0*  MCV 100.8*   < > 102.6* 102.8* 101.3* 104.3* 102.5*  PLT 167   < > 128* 117* 141* 146* 164   < > = values in this interval not displayed.   Basic Metabolic Panel: Recent Labs  Lab 05/11/18 0226 05/11/18 0236 05/12/18 0554 05/13/18 0656 05/14/18 0549 05/15/18 0602  NA  --  145 145 145 140 140  K  --  3.2* 3.4* 3.0* 3.5 4.5  CL  --  106 107 103 104 104  CO2  --  26 29 25 24 28   GLUCOSE  --  191* 221* 182* 167* 169*  BUN  --  51* 63* 69* 60* 51*  CREATININE  --  1.75* 1.79* 1.82* 1.64* 1.47*  CALCIUM  --  9.3 9.4 9.2 9.0 9.2  MG 2.3  --  2.6*  --   --   --    GFR: Estimated Creatinine Clearance: 38.3 mL/min (A) (by C-G formula based on SCr of 1.47 mg/dL (H)). Liver Function Tests: Recent Labs  Lab 05/09/18 0548 05/10/18 0539 05/11/18 0236 05/12/18 0554  AST 28 26 33 16  ALT 15 15 20 19   ALKPHOS 77 80 97 89  BILITOT 1.3* 1.0 1.1 0.7  PROT 6.5 6.1* 6.4* 6.8  ALBUMIN 2.8* 2.7* 2.8* 2.9*   No results for input(s): LIPASE, AMYLASE in the  last 168 hours. No results for input(s): AMMONIA in the last 168 hours. Coagulation Profile: Recent Labs  Lab 05/09/18 1152  INR 1.39   Cardiac Enzymes: No results for input(s): CKTOTAL, CKMB, CKMBINDEX, TROPONINI in the last 168 hours. BNP (last 3 results) Recent Labs    10/19/17 1448  PROBNP 473   HbA1C: No results for input(s): HGBA1C in the last 72 hours. CBG: Recent Labs  Lab 05/14/18 1136 05/14/18 1621 05/14/18 2159 05/15/18 0743 05/15/18 1137  GLUCAP 164* 157* 209* 151* 158*   Lipid Profile: No results for input(s): CHOL, HDL, LDLCALC, TRIG, CHOLHDL, LDLDIRECT in the last 72 hours. Thyroid Function Tests: No results for input(s): TSH, T4TOTAL, FREET4, T3FREE, THYROIDAB in the last 72 hours. Anemia Panel: No results for input(s): VITAMINB12, FOLATE, FERRITIN, TIBC, IRON, RETICCTPCT in the last 72 hours. Sepsis Labs: No results for input(s): PROCALCITON, LATICACIDVEN in the last 168 hours.  Recent Results (from the past 240 hour(s))  Blood culture (routine x 2)     Status: Abnormal   Collection Time: 05/08/18  6:09 AM  Result Value Ref Range Status   Specimen Description   Final    BLOOD RIGHT ARM Performed at [facility] [address] [place], [place] [address]    Special Requests   Final    BOTTLES DRAWN AEROBIC AND ANAEROBIC Blood Culture adequate volume Performed at [facility] [address], [place], [place] [address]    Culture  Setup Time   Final    GRAM NEGATIVE RODS IN BOTH AEROBIC AND ANAEROBIC BOTTLES CRITICAL VALUE NOTED.  VALUE IS CONSISTENT WITH PREVIOUSLY REPORTED AND CALLED VALUE.    Culture (A)  Final    ESCHERICHIA COLI SUSCEPTIBILITIES PERFORMED ON PREVIOUS CULTURE WITHIN THE LAST 5 DAYS. Performed at [facility], [place] [address], [place], [place] [address]    Report Status 05/10/2018 FINAL  Final  Blood culture (routine x 2)     Status: Abnormal   Collection Time: 05/08/18  6:16 AM  Result Value Ref Range Status   Specimen Description   Final    BLOOD LEFT HAND Performed at [facility] [address], [place], [place] [address]    Special Requests   Final    BOTTLES DRAWN AEROBIC AND ANAEROBIC Blood Culture adequate volume Performed at [facility] [address], [place], [place] [address]    Culture  Setup Time   Final    GRAM NEGATIVE RODS IN BOTH AEROBIC AND ANAEROBIC BOTTLES CRITICAL RESULT CALLED TO, READ BACK BY AND VERIFIED WITH: B.GREEN,PHARMD AT 0026 ON 05/09/18 BY G.MCADOO Performed at [facility], [place] [address] [place], [place] [address]    Culture ESCHERICHIA COLI (A)  Final   Report Status 05/10/2018 FINAL  Final   Organism ID, Bacteria ESCHERICHIA COLI  Final      Susceptibility   Escherichia coli - MIC*    AMPICILLIN >=32 RESISTANT Resistant     CEFAZOLIN >=64 RESISTANT Resistant     CEFEPIME <=1 SENSITIVE Sensitive     CEFTAZIDIME <=1 SENSITIVE Sensitive     CEFTRIAXONE <=1 SENSITIVE Sensitive     CIPROFLOXACIN <=0.25 SENSITIVE Sensitive     GENTAMICIN <=1 SENSITIVE Sensitive     IMIPENEM <=0.25 SENSITIVE Sensitive     TRIMETH/SULFA >=320 RESISTANT Resistant     AMPICILLIN/SULBACTAM >=32 RESISTANT Resistant     PIP/TAZO 64 INTERMEDIATE Intermediate     Extended ESBL NEGATIVE Sensitive     * ESCHERICHIA COLI  Blood Culture ID Panel (Reflexed)     Status: Abnormal   Collection Time: 05/08/18  6:16 AM  Result Value Ref Range Status   Enterococcus species NOT DETECTED NOT DETECTED Final   Listeria monocytogenes NOT DETECTED NOT DETECTED Final   Staphylococcus species NOT DETECTED NOT DETECTED Final   Staphylococcus aureus (BCID) NOT DETECTED NOT DETECTED Final   Streptococcus species NOT DETECTED NOT DETECTED Final   Streptococcus agalactiae NOT DETECTED NOT DETECTED Final   Streptococcus pneumoniae NOT DETECTED NOT DETECTED Final   Streptococcus pyogenes NOT DETECTED NOT DETECTED Final    Acinetobacter baumannii NOT DETECTED NOT DETECTED Final   Enterobacteriaceae species DETECTED (A) NOT DETECTED Final    Comment: Enterobacteriaceae represent a large family of gram-negative bacteria, not a single organism. CRITICAL RESULT CALLED TO, READ BACK BY AND VERIFIED WITH: B.GREEN,PHARMD AT 0026 ON 05/09/18 BY G.MCADOO    Enterobacter cloacae complex NOT DETECTED NOT DETECTED Final   Escherichia coli DETECTED (A) NOT DETECTED Final    Comment: CRITICAL RESULT CALLED TO, READ BACK BY AND VERIFIED WITH: B.GREEN,PHARMD AT 0026 ON 05/09/18 BY G.MCADOO    Klebsiella oxytoca NOT DETECTED NOT DETECTED Final   Klebsiella pneumoniae NOT DETECTED NOT DETECTED Final   Proteus species NOT DETECTED NOT DETECTED Final   Serratia marcescens NOT DETECTED NOT DETECTED Final   Carbapenem resistance NOT DETECTED NOT DETECTED Final   Haemophilus influenzae NOT DETECTED NOT DETECTED Final   Neisseria meningitidis NOT DETECTED NOT DETECTED Final   Pseudomonas aeruginosa NOT DETECTED NOT DETECTED Final   Candida albicans NOT DETECTED NOT DETECTED Final   Candida glabrata NOT DETECTED NOT DETECTED Final   Candida krusei NOT DETECTED NOT DETECTED Final   Candida parapsilosis NOT DETECTED NOT DETECTED Final   Candida tropicalis NOT DETECTED NOT DETECTED Final    Comment: Performed at [facility], [place] [address], [place], [place] [address]  Culture, sputum-assessment     Status: None   Collection Time: 05/09/18  2:09 PM  Result Value Ref Range Status   Specimen Description SPUTUM  Final   Special Requests NONE  Final   Sputum evaluation   Final    THIS SPECIMEN IS ACCEPTABLE FOR SPUTUM CULTURE Performed at [facility], [place] [address], [place], [place] [address]    Report Status 05/09/2018 FINAL  Final  MRSA PCR Screening     Status: None   Collection Time: 05/09/18  2:09 PM  Result Value Ref Range Status   MRSA by PCR NEGATIVE NEGATIVE Final    Comment:        The  GeneXpert MRSA Assay (FDA approved for NASAL specimens only), is one component of a comprehensive MRSA colonization surveillance program. It is not intended to diagnose MRSA infection nor to guide or monitor treatment for MRSA infections. Performed at [facility], [place] [address], [place], [place] [address]   Culture, respiratory     Status: None   Collection Time: 05/09/18  2:09 PM  Result Value Ref Range Status   Specimen Description   Final    SPUTUM Performed at [facility] [address] [place], [place] [address]    Special Requests   Final    NONE Reflexed from [id number] Performed at [facility], [place] [address], [place], [place] [address]    Gram Stain   Final    ABUNDANT WBC PRESENT, PREDOMINANTLY PMN RARE GRAM POSITIVE COCCI RARE GRAM NEGATIVE COCCOBACILLI    Culture   Final    RARE Consistent with normal respiratory flora. Performed at [facility], [place] [address], [place], [place] [address]    Report Status 05/11/2018 FINAL  Final  Aerobic/Anaerobic Culture (surgical/deep wound)     Status: None (Preliminary result)   Collection Time: 05/10/18  4:44 PM  Result Value Ref Range Status   Specimen Description   Final    GALL BLADDER Performed at [facility] [address], [place], [place] [address]    Special Requests NONE  Final   Gram Stain   Final    RARE WBC PRESENT, PREDOMINANTLY MONONUCLEAR NO ORGANISMS SEEN    Culture   Final    NO GROWTH 4 DAYS NO ANAEROBES ISOLATED; CULTURE IN PROGRESS FOR 5 DAYS Performed at [facility], [place] [address] [place], [place] [address]    Report Status PENDING  Incomplete         Radiology Studies: No results found.      Scheduled Meds: . acetaminophen  1,000 mg Oral Q8H  . amiodarone  200 mg Oral BID  . apixaban  2.5 mg Oral BID  . budesonide  0.25 mg Nebulization BID  . chlorhexidine  15 mL Mouth Rinse BID  .  guaiFENesin  1,200 mg Oral BID  . insulin aspart  0-9 Units Subcutaneous TID WC  . ipratropium-albuterol  3 mL Nebulization TID  . mouth rinse  15 mL Mouth Rinse q12n4p  . metoprolol tartrate  12.5 mg Oral BID  . pantoprazole  40 mg Oral QHS  . sertraline  25 mg Oral Daily  . sodium chloride flush  5 mL Intracatheter Q12H   Continuous Infusions: . sodium chloride 1,000 mL (05/13/18 1235)  . cefTRIAXone (ROCEPHIN)  IV 2 g (05/15/18 0053)  . metronidazole 500 mg (05/15/18 1030)     LOS: 7 days     Alma Friendly, MD [facility]  If 7PM-7AM, please contact night-coverage 05/15/2018, 3:21 PM

## 2018-05-15 NOTE — Plan of Care (Signed)
Pt is unable to be ambulated in the hall as ordered. It took two people to transfer him from the bed to the chair, and that was very difficult. Pt needs PT order please.

## 2018-05-16 LAB — CBC WITH DIFFERENTIAL/PLATELET
Abs Immature Granulocytes: 0.9 10*3/uL — ABNORMAL HIGH (ref 0.00–0.07)
Basophils Absolute: 0 10*3/uL (ref 0.0–0.1)
Basophils Relative: 0 %
Eosinophils Absolute: 0.3 10*3/uL (ref 0.0–0.5)
Eosinophils Relative: 2 %
HCT: 29.9 % — ABNORMAL LOW (ref 39.0–52.0)
Hemoglobin: 8.7 g/dL — ABNORMAL LOW (ref 13.0–17.0)
Lymphocytes Relative: 5 %
Lymphs Abs: 0.7 10*3/uL (ref 0.7–4.0)
MCH: 30.7 pg (ref 26.0–34.0)
MCHC: 29.1 g/dL — ABNORMAL LOW (ref 30.0–36.0)
MCV: 105.7 fL — ABNORMAL HIGH (ref 80.0–100.0)
Monocytes Absolute: 1.3 10*3/uL — ABNORMAL HIGH (ref 0.1–1.0)
Monocytes Relative: 9 %
Myelocytes: 6 %
Neutro Abs: 11.3 10*3/uL — ABNORMAL HIGH (ref 1.7–7.7)
Neutrophils Relative %: 78 %
Platelets: 137 10*3/uL — ABNORMAL LOW (ref 150–400)
RBC: 2.83 MIL/uL — ABNORMAL LOW (ref 4.22–5.81)
RDW: 15.6 % — ABNORMAL HIGH (ref 11.5–15.5)
WBC: 14.5 10*3/uL — ABNORMAL HIGH (ref 4.0–10.5)
nRBC: 0.5 % — ABNORMAL HIGH (ref 0.0–0.2)

## 2018-05-16 LAB — GLUCOSE, CAPILLARY
GLUCOSE-CAPILLARY: 134 mg/dL — AB (ref 70–99)
Glucose-Capillary: 203 mg/dL — ABNORMAL HIGH (ref 70–99)
Glucose-Capillary: 140 mg/dL — ABNORMAL HIGH (ref 70–99)
Glucose-Capillary: 127 mg/dL — ABNORMAL HIGH (ref 70–99)

## 2018-05-16 LAB — AEROBIC/ANAEROBIC CULTURE W GRAM STAIN (SURGICAL/DEEP WOUND)

## 2018-05-16 LAB — BASIC METABOLIC PANEL
Anion gap: 8 (ref 5–15)
BUN: 38 mg/dL — ABNORMAL HIGH (ref 8–23)
CHLORIDE: 109 mmol/L (ref 98–111)
CO2: 24 mmol/L (ref 22–32)
Calcium: 9.1 mg/dL (ref 8.9–10.3)
Creatinine, Ser: 1.23 mg/dL (ref 0.61–1.24)
GFR calc Af Amer: 60 mL/min — ABNORMAL LOW (ref >60)
GFR calc non Af Amer: 52 mL/min — ABNORMAL LOW (ref >60)
Glucose, Bld: 136 mg/dL — ABNORMAL HIGH (ref 70–99)
Potassium: 4.4 mmol/L (ref 3.5–5.1)
Sodium: 141 mmol/L (ref 135–145)

## 2018-05-16 LAB — FOLATE RBC
FOLATE, RBC: 1348 ng/mL (ref >498)
Folate, Hemolysate: 378.8 ng/mL
HEMATOCRIT: 28.1 % — AB (ref 37.5–51.0)

## 2018-05-16 LAB — PATHOLOGIST SMEAR REVIEW

## 2018-05-16 LAB — AEROBIC/ANAEROBIC CULTURE (SURGICAL/DEEP WOUND): CULTURE: NO GROWTH

## 2018-05-16 NOTE — Progress Notes (Signed)
CSW following patient for support and discharge needs. Patient is from [facility] SNF and will go back once medically cleared.     Rhea Pink, MSW,  [place]

## 2018-05-16 NOTE — Care Management Note (Signed)
Case Management Note  Patient Details  Name: Eric [name] MRN: [id number] Date of Birth: [birth year]  Subjective/Objective:  HIDA+, sx to cons-for possible cholecystostomy, has perc drain, iv abx,colostomy, bilateral pna w/effusions. From SNF-[facility].DNR. CSW already following.                  Action/Plan:dc SNF.   Expected Discharge Date:  (unknown)               Expected Discharge Plan:  Skilled Nursing Facility  In-House Referral:  Clinical Social Work  Discharge planning Services  CM Consult  Post Acute Care Choice:    Choice offered to:     DME Arranged:    DME Agency:     HH Arranged:    [place] Agency:     Status of Service:  In process, will continue to follow  If discussed at Long Length of Stay Meetings, dates discussed:    Additional Comments:  Dessa Phi, RN 05/16/2018, 12:26 PM

## 2018-05-16 NOTE — Progress Notes (Signed)
PROGRESS NOTE    Eric [name]  NWG:[id number] DOB: [birth year] DOA: 05/08/2018 PCP: Eric Koch, MD   Brief Narrative:  Eric [name] is a 84 y.o. malewith medical history significant forcoronary artery disease and aortic sclerosis hypertension hyperlipidemia, diabetes, COPD, O2 dependent on 3 L at home, chronic respiratory failure on oxygen, ischemic cardiomyopathy, atrial fibrillation on Eliquis. Patient presented with right upper quadrant pain concerning for acute cholecystitis, but also found to have evidence of bilateral pneumonia and positive blood cultures for E. Coli. Pt admitted for further management.  Assessment & Plan:   Principal Problem:   Adjustment disorder with depressed mood Active Problems:   Type 2 diabetes mellitus, controlled ([name])   Hypertensive heart disease with CHF (HCC)   COPD mixed type (HCC)   Obstructive sleep apnea   Chronic diastolic heart failure (HCC)   HCAP (healthcare-associated pneumonia)   Obesity (BMI 30-39.9)   Hartmann's pouch of intestine (HCC)   Essential hypertension   Goals of care, counseling/discussion   Atrial fibrillation with RVR (HCC)   Acute cholecystitis   Pneumonia   Pneumonia due to other specified infectious organisms  E. Coli Bacteremia  Acute Cholecystitis Currently afebrile, with resolved leukocytosis E coli bacteremia likely 2/2 acute cholecystitis.  CT scan, RUQ [place], HIDA concerning for cholecystitis.  Now s/p perc drain placement. - 2/2 blood cx from 12/1 with e. Coli sensitive to ceftriaxone, repeat BC on 12/319 - Continue ceftriaxone and flagyl (Day 6 of ceftriaxone, consider verifying with ID once ready for d/c in 1 day or so for length of therapy) - General surgery recommendations: perc tube per IR, advance diet - Percutaneous drain per IR - recommending GB drain to remain in place 4-6 weeks unless cholecystectomy done in interim  - PO pain management  Bilateral pneumonia  Acute Hypoxic  Respiratory Failure Seen on CT abdomen/pelvis from admission on 12/1 (of note, at that time, concern for possible empyema on L side) - MRSA PCR negative - His CXR 12/4, notable for small bilateral pleural effusions - given concern for empyema on abdominal CT at presentation, will follow CT chest  CT chest done on 12/4 showed: Consolidation in the lower lobes bilaterally as well as right middle lobe most compatible with pneumonia - Continue ceftriaxone/flagyl  COPD exacerbation/OSA Acute on chronic respiratory failure On 3L oxygen at home -Continue Duoneb, completed steroids Start home CPAP  Acute on chronic systolic and diastolic HF (57/01/4695 EF mildly reduced to 45%-50%) - CXR consistent with this - Follow CT scan as noted above - Elevated BNP as well - Held home dose Lasix due to AKI on CKD - Daily weights and strict in and out  AKI on CKD stage III: baseline creatinine around 1.3 - 1.5 Improving Held home dose lasix for now Daily BMP  Atrial fibrillation with RVR Rate controlled Restart home eliquis Continue amiodarone (home dilt on hold), metoprolol  Macrocytic anemia Likely iron deficiency anemia with anemia of chronic disease as well (low iron and percent sat, normal ferritin, folate, b12) Start iron when able  Thrombocytopenia Daily CBC  Essential hypertension Metoprolol started as noted above  Diabetes mellitus, type 2 Continue SSI  Obesity Body mass index is 36.8 kg/m.  Hypokalemia: replace, follow  [place] Palliative consulted, appreciate recs  ?Depression No prior hx Psychiatry consulted, appreciate recs Started pt on zoloft    DVT prophylaxis: Eliquis Code Status: DNR Family Communication: none at bedside Disposition Plan: Back to SNF in a day or so  Consultants:  Surgery  IR  Palliative team  Procedures:  Perc cholecystostomy 12/4 by IR  Antimicrobials:  Anti-infectives (From admission, onward)   Start     Dose/Rate  Route Frequency Ordered Stop   05/10/18 2200  cefTRIAXone (ROCEPHIN) 2 g in sodium chloride 0.9 % 100 mL IVPB     2 g 200 mL/hr over 30 Minutes Intravenous Every 24 hours 05/10/18 1417     05/10/18 0600  vancomycin (VANCOCIN) 1,500 mg in sodium chloride 0.9 % 500 mL IVPB  Status:  Discontinued     1,500 mg 250 mL/hr over 120 Minutes Intravenous Every 48 hours 05/08/18 1240 05/10/18 0851   05/09/18 0630  ceFEPIme (MAXIPIME) 2 g in sodium chloride 0.9 % 100 mL IVPB  Status:  Discontinued     2 g 200 mL/hr over 30 Minutes Intravenous Every 12 hours 05/09/18 0209 05/10/18 1417   05/08/18 2100  vancomycin (VANCOCIN) IVPB 1000 mg/200 mL premix  Status:  Discontinued     1,000 mg 200 mL/hr over 60 Minutes Intravenous  Once 05/08/18 2047 05/08/18 2057   05/08/18 1030  vancomycin (VANCOCIN) IVPB 1000 mg/200 mL premix     1,000 mg 200 mL/hr over 60 Minutes Intravenous  Once 05/08/18 0954 05/08/18 1151   05/08/18 0800  metroNIDAZOLE (FLAGYL) IVPB 500 mg     500 mg 100 mL/hr over 60 Minutes Intravenous Every 8 hours 05/08/18 0637     05/08/18 0700  ceFEPIme (MAXIPIME) 1 g in sodium chloride 0.9 % 100 mL IVPB  Status:  Discontinued     1 g 200 mL/hr over 30 Minutes Intravenous Every 8 hours 05/08/18 0637 05/09/18 0209   05/08/18 0615  vancomycin (VANCOCIN) IVPB 1000 mg/200 mL premix     1,000 mg 200 mL/hr over 60 Minutes Intravenous  Once 05/08/18 0603 05/08/18 0749   05/08/18 0615  piperacillin-tazobactam (ZOSYN) IVPB 3.375 g  Status:  Discontinued     3.375 g 100 mL/hr over 30 Minutes Intravenous  Once 05/08/18 0603 05/08/18 8127     Subjective: Today denies any new complaints. Noted to be pretty deconditioned  Objective: Vitals:   05/16/18 0418 05/16/18 0759 05/16/18 0804 05/16/18 1336  BP: 139/69   (!) 112/57  Pulse: 72   91  Resp: 16   20  Temp: 99 F (37.2 C)   97.6 F (36.4 C)  TempSrc: Oral   Oral  SpO2: 99% 96% 96% 99%  Weight:      Height:        Intake/Output Summary  (Last 24 hours) at 05/16/2018 1745 Last data filed at 05/16/2018 1655 Gross per 24 hour  Intake 833.64 ml  Output 880 ml  Net -46.36 ml   Filed Weights   05/14/18 0647 05/15/18 0612 05/16/18 0416  Weight: 101.7 kg 102.9 kg 103.2 kg    Examination:  General: NAD   Cardiovascular: S1, S2 present  Respiratory: CTAB  Abdomen: Soft, nontender, nondistended, bowel sounds present, ostomy in place, perc drain with bilious fluid  Musculoskeletal: No bilateral pedal edema noted  Skin: Normal  Psychiatry: Normal mood    Data Reviewed: I have personally reviewed following labs and imaging studies  CBC: Recent Labs  Lab 05/12/18 0554 05/13/18 0656 05/14/18 0549 05/15/18 0602 05/16/18 0825  WBC 9.4 10.0 10.4 14.4* 14.5*  NEUTROABS  --  6.9 6.0 8.7* 11.3*  HGB 8.7* 9.1* 9.1* 8.7* 8.7*  HCT 29.2* 30.9*  28.1* 31.5* 29.0* 29.9*  MCV 102.8* 101.3* 104.3* 102.5* 105.7*  PLT 117*  141* 146* 164 732*   Basic Metabolic Panel: Recent Labs  Lab 05/11/18 0226  05/12/18 0554 05/13/18 0656 05/14/18 0549 05/15/18 0602 05/16/18 0825  NA  --    < > 145 145 140 140 141  K  --    < > 3.4* 3.0* 3.5 4.5 4.4  CL  --    < > 107 103 104 104 109  CO2  --    < > 29 25 24 28 24   GLUCOSE  --    < > 221* 182* 167* 169* 136*  BUN  --    < > 63* 69* 60* 51* 38*  CREATININE  --    < > 1.79* 1.82* 1.64* 1.47* 1.23  CALCIUM  --    < > 9.4 9.2 9.0 9.2 9.1  MG 2.3  --  2.6*  --   --   --   --    < > = values in this interval not displayed.   GFR: Estimated Creatinine Clearance: 45.8 mL/min (by C-G formula based on SCr of 1.23 mg/dL). Liver Function Tests: Recent Labs  Lab 05/10/18 0539 05/11/18 0236 05/12/18 0554  AST 26 33 16  ALT 15 20 19   ALKPHOS 80 97 89  BILITOT 1.0 1.1 0.7  PROT 6.1* 6.4* 6.8  ALBUMIN 2.7* 2.8* 2.9*   No results for input(s): LIPASE, AMYLASE in the last 168 hours. No results for input(s): AMMONIA in the last 168 hours. Coagulation Profile: No results for  input(s): INR, PROTIME in the last 168 hours. Cardiac Enzymes: No results for input(s): CKTOTAL, CKMB, CKMBINDEX, TROPONINI in the last 168 hours. BNP (last 3 results) Recent Labs    10/19/17 1448  PROBNP 473   HbA1C: No results for input(s): HGBA1C in the last 72 hours. CBG: Recent Labs  Lab 05/15/18 1635 05/15/18 2131 05/16/18 0750 05/16/18 1138 05/16/18 1648  GLUCAP 141* 159* 134* 203* 140*   Lipid Profile: No results for input(s): CHOL, HDL, LDLCALC, TRIG, CHOLHDL, LDLDIRECT in the last 72 hours. Thyroid Function Tests: No results for input(s): TSH, T4TOTAL, FREET4, T3FREE, THYROIDAB in the last 72 hours. Anemia Panel: No results for input(s): VITAMINB12, FOLATE, FERRITIN, TIBC, IRON, RETICCTPCT in the last 72 hours. Sepsis Labs: No results for input(s): PROCALCITON, LATICACIDVEN in the last 168 hours.  Recent Results (from the past 240 hour(s))  Blood culture (routine x 2)     Status: Abnormal   Collection Time: 05/08/18  6:09 AM  Result Value Ref Range Status   Specimen Description   Final    BLOOD RIGHT ARM Performed at [facility] [address], [place], [place] [address]    Special Requests   Final    BOTTLES DRAWN AEROBIC AND ANAEROBIC Blood Culture adequate volume Performed at [facility] [address], [place], [place] [address]    Culture  Setup Time   Final    GRAM NEGATIVE RODS IN BOTH AEROBIC AND ANAEROBIC BOTTLES CRITICAL VALUE NOTED.  VALUE IS CONSISTENT WITH PREVIOUSLY REPORTED AND CALLED VALUE.    Culture (A)  Final    ESCHERICHIA COLI SUSCEPTIBILITIES PERFORMED ON PREVIOUS CULTURE WITHIN THE LAST 5 DAYS. Performed at [facility], [place] [address], [place], [place] [address]    Report Status 05/10/2018 FINAL  Final  Blood culture (routine x 2)     Status: Abnormal   Collection Time: 05/08/18  6:16 AM  Result Value Ref Range Status   Specimen Description   Final  BLOOD LEFT HAND Performed  at [facility] [address], [place], [place] [address]    Special Requests   Final    BOTTLES DRAWN AEROBIC AND ANAEROBIC Blood Culture adequate volume Performed at [facility] [address], [place], [place] [address]    Culture  Setup Time   Final    GRAM NEGATIVE RODS IN BOTH AEROBIC AND ANAEROBIC BOTTLES CRITICAL RESULT CALLED TO, READ BACK BY AND VERIFIED WITH: B.GREEN,PHARMD AT 0026 ON 05/09/18 BY G.MCADOO Performed at [facility], [place] [address] [place], [place] [address]    Culture ESCHERICHIA COLI (A)  Final   Report Status 05/10/2018 FINAL  Final   Organism ID, Bacteria ESCHERICHIA COLI  Final      Susceptibility   Escherichia coli - MIC*    AMPICILLIN >=32 RESISTANT Resistant     CEFAZOLIN >=64 RESISTANT Resistant     CEFEPIME <=1 SENSITIVE Sensitive     CEFTAZIDIME <=1 SENSITIVE Sensitive     CEFTRIAXONE <=1 SENSITIVE Sensitive     CIPROFLOXACIN <=0.25 SENSITIVE Sensitive     GENTAMICIN <=1 SENSITIVE Sensitive     IMIPENEM <=0.25 SENSITIVE Sensitive     TRIMETH/SULFA >=320 RESISTANT Resistant     AMPICILLIN/SULBACTAM >=32 RESISTANT Resistant     PIP/TAZO 64 INTERMEDIATE Intermediate     Extended ESBL NEGATIVE Sensitive     * ESCHERICHIA COLI  Blood Culture ID Panel (Reflexed)     Status: Abnormal   Collection Time: 05/08/18  6:16 AM  Result Value Ref Range Status   Enterococcus species NOT DETECTED NOT DETECTED Final   Listeria monocytogenes NOT DETECTED NOT DETECTED Final   Staphylococcus species NOT DETECTED NOT DETECTED Final   Staphylococcus aureus (BCID) NOT DETECTED NOT DETECTED Final   Streptococcus species NOT DETECTED NOT DETECTED Final   Streptococcus agalactiae NOT DETECTED NOT DETECTED Final   Streptococcus pneumoniae NOT DETECTED NOT DETECTED Final   Streptococcus pyogenes NOT DETECTED NOT DETECTED Final   Acinetobacter baumannii NOT DETECTED NOT DETECTED Final   Enterobacteriaceae species DETECTED  (A) NOT DETECTED Final    Comment: Enterobacteriaceae represent a large family of gram-negative bacteria, not a single organism. CRITICAL RESULT CALLED TO, READ BACK BY AND VERIFIED WITH: B.GREEN,PHARMD AT 0026 ON 05/09/18 BY G.MCADOO    Enterobacter cloacae complex NOT DETECTED NOT DETECTED Final   Escherichia coli DETECTED (A) NOT DETECTED Final    Comment: CRITICAL RESULT CALLED TO, READ BACK BY AND VERIFIED WITH: B.GREEN,PHARMD AT 0026 ON 05/09/18 BY G.MCADOO    Klebsiella oxytoca NOT DETECTED NOT DETECTED Final   Klebsiella pneumoniae NOT DETECTED NOT DETECTED Final   Proteus species NOT DETECTED NOT DETECTED Final   Serratia marcescens NOT DETECTED NOT DETECTED Final   Carbapenem resistance NOT DETECTED NOT DETECTED Final   Haemophilus influenzae NOT DETECTED NOT DETECTED Final   Neisseria meningitidis NOT DETECTED NOT DETECTED Final   Pseudomonas aeruginosa NOT DETECTED NOT DETECTED Final   Candida albicans NOT DETECTED NOT DETECTED Final   Candida glabrata NOT DETECTED NOT DETECTED Final   Candida krusei NOT DETECTED NOT DETECTED Final   Candida parapsilosis NOT DETECTED NOT DETECTED Final   Candida tropicalis NOT DETECTED NOT DETECTED Final    Comment: Performed at [facility], [place] [address] [place], [place] [address]  Culture, sputum-assessment     Status: None   Collection Time: 05/09/18  2:09 PM  Result Value Ref Range Status   Specimen Description SPUTUM  Final  Special Requests NONE  Final   Sputum evaluation   Final    THIS SPECIMEN IS ACCEPTABLE FOR SPUTUM CULTURE Performed at [facility] [address] [place], [place] [address]    Report Status 05/09/2018 FINAL  Final  MRSA PCR Screening     Status: None   Collection Time: 05/09/18  2:09 PM  Result Value Ref Range Status   MRSA by PCR NEGATIVE NEGATIVE Final    Comment:        The GeneXpert MRSA Assay (FDA approved for NASAL specimens only), is one component of a comprehensive  MRSA colonization surveillance program. It is not intended to diagnose MRSA infection nor to guide or monitor treatment for MRSA infections. Performed at [facility], [place] [address], [place], [place] [address]   Culture, respiratory     Status: None   Collection Time: 05/09/18  2:09 PM  Result Value Ref Range Status   Specimen Description   Final    SPUTUM Performed at [facility] [address] [place], [place] [address]    Special Requests   Final    NONE Reflexed from [id number] Performed at [facility], [place] [address], [place], [place] [address]    Gram Stain   Final    ABUNDANT WBC PRESENT, PREDOMINANTLY PMN RARE GRAM POSITIVE COCCI RARE GRAM NEGATIVE COCCOBACILLI    Culture   Final    RARE Consistent with normal respiratory flora. Performed at [facility], [place] [address] [place], [place] [address]    Report Status 05/11/2018 FINAL  Final  Aerobic/Anaerobic Culture (surgical/deep wound)     Status: None   Collection Time: 05/10/18  4:44 PM  Result Value Ref Range Status   Specimen Description   Final    GALL BLADDER Performed at [facility] [address] [place], [place] [address]    Special Requests NONE  Final   Gram Stain   Final    RARE WBC PRESENT, PREDOMINANTLY MONONUCLEAR NO ORGANISMS SEEN    Culture   Final    No growth aerobically or anaerobically. Performed at [facility], [place] [address], [place],  [address]    Report Status 05/16/2018 FINAL  Final         Radiology Studies: No results found.      Scheduled Meds: . acetaminophen  1,000 mg Oral Q8H  . amiodarone  200 mg Oral BID  . apixaban  2.5 mg Oral BID  . budesonide  0.25 mg Nebulization BID  . chlorhexidine  15 mL Mouth Rinse BID  . guaiFENesin  1,200 mg Oral BID  . insulin aspart  0-9 Units Subcutaneous TID WC  . ipratropium-albuterol  3 mL Nebulization TID  . mouth rinse  15  mL Mouth Rinse q12n4p  . metoprolol tartrate  12.5 mg Oral BID  . pantoprazole  40 mg Oral QHS  . sertraline  25 mg Oral Daily  . sodium chloride flush  5 mL Intracatheter Q12H   Continuous Infusions: . sodium chloride 250 mL (05/15/18 1736)  . cefTRIAXone (ROCEPHIN)  IV 2 g (05/15/18 2320)  . metronidazole 500 mg (05/16/18 1055)     LOS: 8 days     Alma Friendly, MD [facility]  If 7PM-7AM, please contact night-coverage 05/16/2018, 5:45 PM

## 2018-05-16 NOTE — Progress Notes (Signed)
Physical Therapy Treatment Patient Details Name: LEVERNE AMRHEIN MRN: [id number] DOB: [birth year] Today's Date: 05/16/2018    History of Present Illness 84 yo male admitted from SNF with depressed mood, bil Pna. S/p per chole 12/3. Hx of COPD-O2 dep, CAD, DM, R THA, colostomy    PT Comments    Pt with improved total ambulation distance of 10 ft this session, required one seated rest break due to DOE. Pt's SpO2 WNL during session on 3LO2. Pt with tachycardia with exertion, reaching 120 bpm with first 5 ft of ambulation. Pt with continued anxiety about mobility this session, but is motivated to continue with PT, stating at the end of the session "please come back tomorrow". PT to continue to follow acutely.    Follow Up Recommendations  SNF     Equipment Recommendations  None recommended by PT    Recommendations for Other Services       Precautions / Restrictions Precautions Precautions: Fall Precaution Comments: Pt has colostomy, biliary tube Restrictions Weight Bearing Restrictions: No    Mobility  Bed Mobility Overal bed mobility: Needs Assistance Bed Mobility: Rolling;Sidelying to Sit Rolling: Min assist Sidelying to sit: Mod assist;HOB elevated       General bed mobility comments: Min assist for rolling for UE placement, translation assist at pelvis and shoulder girdle. Mod assist for sidelying to sit for trunk elevation, LE management, and scooting to EOB. During sidelying to sit, pt attempted to lay back down without explanation. PT encouraged pt to progress to sitting position.   Transfers Overall transfer level: Needs assistance Equipment used: Rolling walker (2 wheeled) Transfers: Sit to/from Stand Sit to Stand: Mod assist;+2 safety/equipment;From elevated surface         General transfer comment: Mod assist for power up at both gait belt and under L shoulder, hip extension, and steadying upon standing. Pt attempting to walk right when he stood up, pt  encouraged to adjust to standing for stability prior to initiating ambulation. Sit to stand x2, once from bed and once from recliner.  Ambulation/Gait Ambulation/Gait assistance: +2 physical assistance;+2 safety/equipment;Min assist Gait Distance (Feet): 10 Feet(2x5 ft ) Assistive device: Rolling walker (2 wheeled) Gait Pattern/deviations: Decreased step length - left;Decreased step length - right;Decreased stride length;Trunk flexed;Shuffle Gait velocity: decr    General Gait Details: Min assist for steadying, directing pt and RW. Pt required chair follow in room due to unsteadiness in standing. Pt additionally required seated rest break after 5 ft ambulation due to dyspnea 3/4, pt instructed in pursed lip breathing to recover breathing. After 2 minute rest break, pt ambulated 5 more ft in his room before he sat in his recliner. SPO2 on 3LO2 95-96%, HR 91-120.   Stairs             Wheelchair Mobility    Modified Rankin (Stroke Patients Only)       Balance Overall balance assessment: Needs assistance Sitting-balance support: Bilateral upper extremity supported;Feet supported Sitting balance-Leahy Scale: Fair Sitting balance - Comments: does not require PT support to sit      Standing balance-Leahy Scale: Poor Standing balance comment: relies on RW and PT steadying                             Cognition Arousal/Alertness: Awake/alert Behavior During Therapy: Anxious Overall Cognitive Status: No family/caregiver present to determine baseline cognitive functioning  General Comments: Pt with anxiety throughout session especially with exertion due to DOE, PT encourage pt to take his time at each mobility transition and take rest breaks as needed. Pt states he is confused, and thought PT had already seen him today.       Exercises      General Comments General comments (skin integrity, edema, etc.): 3LO2 for duration  of session       Pertinent Vitals/Pain Pain Assessment: Faces Faces Pain Scale: Hurts a little bit Pain Location: no pain location stated  Pain Descriptors / Indicators: Grimacing;Discomfort Pain Intervention(s): Limited activity within patient's tolerance;Monitored during session    Home Living                      Prior Function            PT Goals (current goals can now be found in the care plan section) Acute Rehab PT Goals Patient Stated Goal: to be able to walk PT Goal Formulation: With patient Time For Goal Achievement: 05/29/18 Potential to Achieve Goals: Fair Progress towards PT goals: Progressing toward goals    Frequency    Min 2X/week      PT Plan Current plan remains appropriate    Co-evaluation              AM-PAC PT "6 Clicks" Mobility   Outcome Measure  Help needed turning from your back to your side while in a flat bed without using bedrails?: A Little Help needed moving from lying on your back to sitting on the side of a flat bed without using bedrails?: A Lot Help needed moving to and from a bed to a chair (including a wheelchair)?: A Lot Help needed standing up from a chair using your arms (e.g., wheelchair or bedside chair)?: A Lot Help needed to walk in hospital room?: A Little Help needed climbing 3-5 steps with a railing? : Total 6 Click Score: 13    End of Session Equipment Utilized During Treatment: Oxygen Activity Tolerance: Patient limited by fatigue;Other (comment)(dyspnea on exertion) Patient left: in chair;with chair alarm set Nurse Communication: Mobility status PT Visit Diagnosis: Muscle weakness (generalized) (M62.81);Difficulty in walking, not elsewhere classified (R26.2);Unsteadiness on feet (R26.81)     Time: 1205-1222 PT Time Calculation (min) (ACUTE ONLY): 17 min  Charges:  $Gait Training: 8-22 mins                    Julien Girt, PT Acute Rehabilitation Services Pager [phone]  Office  [phone]   Winona Sison [facility] 05/16/2018, 1:02 PM

## 2018-05-16 NOTE — Care Management Important Message (Signed)
Important Message  Patient Details  Name: Eric [name] MRN: [id number] Date of Birth: 1927/03/03   Medicare Important Message Given:  Yes    Kerin Salen 05/16/2018, 1:20 PMImportant Message  Patient Details  Name: Eric [name] MRN: [id number] Date of Birth: 13-Jul-1926   Medicare Important Message Given:  Yes    Kerin Salen 05/16/2018, 1:20 PM

## 2018-05-16 NOTE — Progress Notes (Signed)
Patient tolerating well on CPAP

## 2018-05-16 NOTE — Progress Notes (Addendum)
YS:[id number]/ RUQ pain  Subjective: He is much brighter this AM than he has been since I saw him 1 weeks ago.  Eating, not complaining of pain this AM, drain working well, cough is much better.    Objective: Vital signs in last 24 hours: Temp:  [97.7 F (36.5 C)-99 F (37.2 C)] 99 F (37.2 C) (12/09 0418) Pulse Rate:  [72-92] 72 (12/09 0418) Resp:  [16-20] 16 (12/09 0418) BP: (137-144)/(52-74) 139/69 (12/09 0418) SpO2:  [96 %-99 %] 96 % (12/09 0804) Weight:  [103.2 kg] 103.2 kg (12/09 0416) Last BM Date: 05/15/18 Nothing PO recorded Urine 725 Drain 280 Afebrile, VSS WBC 23.1   >> 10.4(12/6) >> 14.5 (12/9) Creatinine is down:  1.79 (12/04) >> 1.23 today   Intake/Output from previous day: 12/08 0701 - 12/09 0700 In: 205 [IV Piggyback:200] Out: 1005 [Urine:725; Drains:280] Intake/Output this shift: No intake/output data recorded.  General appearance: alert, cooperative, no distress and not crying this AM, wants help getting up and moving.   Resp: still has a congested cough, on O2, but much better than last week.   GI: drain in place, not tender at all this AM.  parastomal hernia OK, he has stool and gas in the bag today.    Lab Results:  Recent Labs    05/15/18 0602 05/16/18 0825  WBC 14.4* 14.5*  HGB 8.7* 8.7*  HCT 29.0* 29.9*  PLT 164 137*    BMET Recent Labs    05/15/18 0602 05/16/18 0825  NA 140 141  K 4.5 4.4  CL 104 109  CO2 28 24  GLUCOSE 169* 136*  BUN 51* 38*  CREATININE 1.47* 1.23  CALCIUM 9.2 9.1   PT/INR No results for input(s): LABPROT, INR in the last 72 hours.  Recent Labs  Lab 05/10/18 0539 05/11/18 0236 05/12/18 0554  AST 26 33 16  ALT 15 20 19   ALKPHOS 80 97 89  BILITOT 1.0 1.1 0.7  PROT 6.1* 6.4* 6.8  ALBUMIN 2.7* 2.8* 2.9*     Lipase     Component Value Date/Time   LIPASE 20 05/08/2018 0328     Medications: . acetaminophen  1,000 mg Oral Q8H  . amiodarone  200 mg Oral BID  . apixaban  2.5 mg Oral BID  .  budesonide  0.25 mg Nebulization BID  . chlorhexidine  15 mL Mouth Rinse BID  . guaiFENesin  1,200 mg Oral BID  . insulin aspart  0-9 Units Subcutaneous TID WC  . ipratropium-albuterol  3 mL Nebulization TID  . mouth rinse  15 mL Mouth Rinse q12n4p  . metoprolol tartrate  12.5 mg Oral BID  . pantoprazole  40 mg Oral QHS  . sertraline  25 mg Oral Daily  . sodium chloride flush  5 mL Intracatheter Q12H    Assessment/Plan  COPD AF/on Eliquis - no dose in hospital Hx of CDHF/Hx CABG Type II diabetes Obesity BMI 36.8. Hx OSA Hypertension Hx colon resection/colostomy 11/2014, SBO 2009 Anemia/mild thrombocytopenia Moderate Malnutrition Deconditioning   RUQ pain - HIDAScan positive - IR evaluation forcholecystostomy requested Bilateral pneumonia with effusions  FEN: IV fluids/clears =>> from our standpoint he can be advanced as tolerated, he says he is not hungry and is           not trying to eat very much. ID: Maxipime 12/1 >>day 2 Flagyl 12/1 >>day 4Vancomycin 12/1 - 12/3  Rocephin 12/3 =>> day 3 DVT: Apixaban  Follow up: Dr. Excell Seltzer  Plan:  Follow up with IR and DR. Hoxworth 4-5 weeks.  He is asking about cholecystectomy.  I told him it could be discussed on follow up visit.      LOS: 8 days    Einer Meals 05/16/2018 [phone]

## 2018-05-17 ENCOUNTER — Inpatient Hospital Stay (HOSPITAL_COMMUNITY): Payer: Medicare Other

## 2018-05-17 DIAGNOSIS — F4321 Adjustment disorder with depressed mood: Secondary | ICD-10-CM

## 2018-05-17 LAB — BASIC METABOLIC PANEL
Anion gap: 5 (ref 5–15)
BUN: 34 mg/dL — ABNORMAL HIGH (ref 8–23)
CALCIUM: 8.9 mg/dL (ref 8.9–10.3)
CO2: 26 mmol/L (ref 22–32)
Chloride: 109 mmol/L (ref 98–111)
Creatinine, Ser: 1.39 mg/dL — ABNORMAL HIGH (ref 0.61–1.24)
GFR calc Af Amer: 52 mL/min — ABNORMAL LOW (ref >60)
GFR, EST NON AFRICAN AMERICAN: 45 mL/min — AB (ref >60)
Glucose, Bld: 129 mg/dL — ABNORMAL HIGH (ref 70–99)
Potassium: 4.4 mmol/L (ref 3.5–5.1)
Sodium: 140 mmol/L (ref 135–145)

## 2018-05-17 LAB — CBC WITH DIFFERENTIAL/PLATELET
Abs Immature Granulocytes: 2.44 10*3/uL — ABNORMAL HIGH (ref 0.00–0.07)
Basophils Absolute: 0 10*3/uL (ref 0.0–0.1)
Basophils Relative: 0 %
EOS ABS: 0.1 10*3/uL (ref 0.0–0.5)
Eosinophils Relative: 1 %
HCT: 29.4 % — ABNORMAL LOW (ref 39.0–52.0)
Hemoglobin: 8.5 g/dL — ABNORMAL LOW (ref 13.0–17.0)
Immature Granulocytes: 19 %
Lymphocytes Relative: 4 %
Lymphs Abs: 0.5 10*3/uL — ABNORMAL LOW (ref 0.7–4.0)
MCH: 29.9 pg (ref 26.0–34.0)
MCHC: 28.9 g/dL — ABNORMAL LOW (ref 30.0–36.0)
MCV: 103.5 fL — ABNORMAL HIGH (ref 80.0–100.0)
Monocytes Absolute: 2.1 10*3/uL — ABNORMAL HIGH (ref 0.1–1.0)
Monocytes Relative: 17 %
Neutro Abs: 7.5 10*3/uL (ref 1.7–7.7)
Neutrophils Relative %: 59 %
Platelets: 116 10*3/uL — ABNORMAL LOW (ref 150–400)
RBC: 2.84 MIL/uL — ABNORMAL LOW (ref 4.22–5.81)
RDW: 16.3 % — ABNORMAL HIGH (ref 11.5–15.5)
WBC: 12.6 10*3/uL — AB (ref 4.0–10.5)
nRBC: 0.4 % — ABNORMAL HIGH (ref 0.0–0.2)

## 2018-05-17 LAB — BRAIN NATRIURETIC PEPTIDE: B Natriuretic Peptide: 250.4 pg/mL — ABNORMAL HIGH (ref 0.0–100.0)

## 2018-05-17 LAB — GLUCOSE, CAPILLARY
Glucose-Capillary: 123 mg/dL — ABNORMAL HIGH (ref 70–99)
Glucose-Capillary: 163 mg/dL — ABNORMAL HIGH (ref 70–99)
Glucose-Capillary: 161 mg/dL — ABNORMAL HIGH (ref 70–99)
Glucose-Capillary: 145 mg/dL — ABNORMAL HIGH (ref 70–99)

## 2018-05-17 MED ORDER — FUROSEMIDE 40 MG PO TABS
40.0000 mg | ORAL_TABLET | Freq: Two times a day (BID) | ORAL | Status: DC
  Administered 2018-05-17 – 2018-05-19 (×4): 40 mg via ORAL
  Filled 2018-05-17 (×4): qty 1

## 2018-05-17 NOTE — Progress Notes (Signed)
PROGRESS NOTE    ORION MOLE  OVZ:[id number] DOB: 1927/04/06 DOA: 05/08/2018 PCP: [name], MD   Brief Narrative:  Allah Reason Nortonis Aden Sek 84 y.o.malewith medical history significant forcoronary artery disease and aortic sclerosis hypertension hyperlipidemia, diabetes, COPD, O2 dependent on 3 L at home, chronic respiratory failure on oxygen, ischemic cardiomyopathy, atrial fibrillation on Eliquis. Patient presented with right upper quadrant pain concerning for acute cholecystitis, but also found to have evidence of bilateral pneumonia and positive blood cultures for E. Coli. Pt admitted for further management.   Assessment & Plan:   Principal Problem:   Adjustment disorder with depressed mood Active Problems:   Type 2 diabetes mellitus, controlled ([place])   Hypertensive heart disease with CHF (HCC)   COPD mixed type (HCC)   Obstructive sleep apnea   Chronic diastolic heart failure (HCC)   HCAP (healthcare-associated pneumonia)   Obesity (BMI 30-39.9)   Hartmann's pouch of intestine (HCC)   Essential hypertension   Goals of care, counseling/discussion   Atrial fibrillation with RVR (HCC)   Acute cholecystitis   Pneumonia   Pneumonia due to other specified infectious organisms   E. Coli Bacteremia  Acute Cholecystitis Currently afebrile, with resolved leukocytosis E coli bacteremia likely 2/2 acute cholecystitis.  CT scan, RUQ [place], HIDA concerning for cholecystitis.  Now s/p perc drain placement. - 2/2 blood cx from 12/1 with e. Coli sensitive to ceftriaxone - S/p cefepime 12/1-12/3.  Continue ceftriaxone and flagyl  (12/3 - present).  Will need 7-14 day course for bacteremia.   - General surgery recommendations: perc tube per IR, advance diet - Percutaneous drain per IR - recommending GB drain to remain in place 4-6 weeks unless cholecystectomy done in interim  - PO pain management  Bilateral pneumonia  Acute Hypoxic Respiratory Failure Seen on CT  abdomen/pelvis from admission on 12/1 (of note, at that time, concern for possible empyema on L side) - MRSA PCR negative - His CXR 12/4, notable for small bilateral pleural effusions - given concern for empyema on abdominal CT at presentation, will follow CT chest  CT chest done on 12/4 showed: Consolidation in the lower lobes bilaterally as well as right middle lobe most compatible with pneumonia - Continue ceftriaxone/flagyl  COPD exacerbation/OSA Acute on chronic respiratory failure On 3L oxygen at home -Continue Duoneb, completed steroids Start home CPAP  Acute on chronic systolic and diastolic HF (41/07/8784 EF mildly reduced to 45%-50%) - CXR 12/10 concerning for edema vs atelecatasis - resume lasix, follow creatinine closely - Daily weights and strict in and out  AKI on CKD stage III: baseline creatinine around 1.3 - 1.5 Fluctuating, follow when restarting lasix  Atrial fibrillation with RVR Rate controlled Restart home eliquis Continue amiodarone (home dilt on hold), metoprolol  Macrocytic anemia Likely iron deficiency anemia with anemia of chronic disease as well (low iron and percent sat, normal ferritin, folate, b12) Start iron when able  Thrombocytopenia Daily CBC, follow  Essential hypertension Metoprolol started as noted above  Diabetes mellitus, type 2 Continue SSI  Obesity Body mass index is 36.8 kg/m.  Hypokalemia: replace, follow  [place] Palliative consulted, appreciate recs - recommending outpatient palliative care to follow at discharge  ?Depression No prior hx Psychiatry consulted, appreciate recs (recommended d/c remeron, start zoloft, CPAP) Started pt on zoloft   DVT prophylaxis: eliquis Code Status: full  Family Communication: none at bedside Disposition Plan: pending further improvement   Consultants:   Surgery  Psych  IR  Palliatve  Procedures:  Perc chole 12/3 by IR  Antimicrobials:  Anti-infectives (From  admission, onward)   Start     Dose/Rate Route Frequency Ordered Stop   05/10/18 2200  cefTRIAXone (ROCEPHIN) 2 g in sodium chloride 0.9 % 100 mL IVPB     2 g 200 mL/hr over 30 Minutes Intravenous Every 24 hours 05/10/18 1417     05/10/18 0600  vancomycin (VANCOCIN) 1,500 mg in sodium chloride 0.9 % 500 mL IVPB  Status:  Discontinued     1,500 mg 250 mL/hr over 120 Minutes Intravenous Every 48 hours 05/08/18 1240 05/10/18 0851   05/09/18 0630  ceFEPIme (MAXIPIME) 2 g in sodium chloride 0.9 % 100 mL IVPB  Status:  Discontinued     2 g 200 mL/hr over 30 Minutes Intravenous Every 12 hours 05/09/18 0209 05/10/18 1417   05/08/18 2100  vancomycin (VANCOCIN) IVPB 1000 mg/200 mL premix  Status:  Discontinued     1,000 mg 200 mL/hr over 60 Minutes Intravenous  Once 05/08/18 2047 05/08/18 2057   05/08/18 1030  vancomycin (VANCOCIN) IVPB 1000 mg/200 mL premix     1,000 mg 200 mL/hr over 60 Minutes Intravenous  Once 05/08/18 0954 05/08/18 1151   05/08/18 0800  metroNIDAZOLE (FLAGYL) IVPB 500 mg     500 mg 100 mL/hr over 60 Minutes Intravenous Every 8 hours 05/08/18 0637     05/08/18 0700  ceFEPIme (MAXIPIME) 1 g in sodium chloride 0.9 % 100 mL IVPB  Status:  Discontinued     1 g 200 mL/hr over 30 Minutes Intravenous Every 8 hours 05/08/18 0637 05/09/18 0209   05/08/18 0615  vancomycin (VANCOCIN) IVPB 1000 mg/200 mL premix     1,000 mg 200 mL/hr over 60 Minutes Intravenous  Once 05/08/18 0603 05/08/18 0749   05/08/18 0615  piperacillin-tazobactam (ZOSYN) IVPB 3.375 g  Status:  Discontinued     3.375 g 100 mL/hr over 30 Minutes Intravenous  Once 05/08/18 0603 05/08/18 0637      Subjective: Doesn't feel well Frustrated overall  Objective: Vitals:   05/17/18 0418 05/17/18 0804 05/17/18 0810 05/17/18 1247  BP: 130/68   (!) 129/57  Pulse: 78   85  Resp: 18   19  Temp: 98.1 F (36.7 C)   98.6 F (37 C)  TempSrc: Oral   Oral  SpO2: 100% 100% 100% 100%  Weight:      Height:         Intake/Output Summary (Last 24 hours) at 05/17/2018 1404 Last data filed at 05/17/2018 1000 Gross per 24 hour  Intake 560 ml  Output 500 ml  Net 60 ml   Filed Weights   05/15/18 0612 05/16/18 0416 05/17/18 0413  Weight: 102.9 kg 103.2 kg 103.1 kg    Examination:  General exam: Appears calm and comfortable  Respiratory system: Clear to auscultation. Respiratory effort normal. Cardiovascular system: S1 & S2 heard, RRR.  Gastrointestinal system: Abdomen is nondistended, soft and nontender.  Central nervous system: Alert and oriented. No focal neurological deficits. Extremities: Trace dependent edema and LE edema Skin: No rashes, lesions or ulcers Psychiatry: Judgement and insight appear normal. Mood & affect appropriate.     Data Reviewed: I have personally reviewed following labs and imaging studies  CBC: Recent Labs  Lab 05/13/18 0656 05/14/18 0549 05/15/18 0602 05/16/18 0825 05/17/18 0536  WBC 10.0 10.4 14.4* 14.5* 12.6*  NEUTROABS 6.9 6.0 8.7* 11.3* 7.5  HGB 9.1* 9.1* 8.7* 8.7* 8.5*  HCT 30.9*  28.1* 31.5* 29.0* 29.9* 29.4*  MCV 101.3* 104.3* 102.5* 105.7* 103.5*  PLT 141* 146* 164 137* 245*   Basic Metabolic Panel: Recent Labs  Lab 05/11/18 0226  05/12/18 0554 05/13/18 0656 05/14/18 0549 05/15/18 0602 05/16/18 0825 05/17/18 0536  NA  --    < > 145 145 140 140 141 140  K  --    < > 3.4* 3.0* 3.5 4.5 4.4 4.4  CL  --    < > 107 103 104 104 109 109  CO2  --    < > 29 25 24 28 24 26   GLUCOSE  --    < > 221* 182* 167* 169* 136* 129*  BUN  --    < > 63* 69* 60* 51* 38* 34*  CREATININE  --    < > 1.79* 1.82* 1.64* 1.47* 1.23 1.39*  CALCIUM  --    < > 9.4 9.2 9.0 9.2 9.1 8.9  MG 2.3  --  2.6*  --   --   --   --   --    < > = values in this interval not displayed.   GFR: Estimated Creatinine Clearance: 40.5 mL/min (Lynley Killilea) (by C-G formula based on SCr of 1.39 mg/dL (H)). Liver Function Tests: Recent Labs  Lab 05/11/18 0236 05/12/18 0554  AST 33 16  ALT  20 19  ALKPHOS 97 89  BILITOT 1.1 0.7  PROT 6.4* 6.8  ALBUMIN 2.8* 2.9*   No results for input(s): LIPASE, AMYLASE in the last 168 hours. No results for input(s): AMMONIA in the last 168 hours. Coagulation Profile: No results for input(s): INR, PROTIME in the last 168 hours. Cardiac Enzymes: No results for input(s): CKTOTAL, CKMB, CKMBINDEX, TROPONINI in the last 168 hours. BNP (last 3 results) Recent Labs    10/19/17 1448  PROBNP 473   HbA1C: No results for input(s): HGBA1C in the last 72 hours. CBG: Recent Labs  Lab 05/16/18 1138 05/16/18 1648 05/16/18 2115 05/17/18 0748 05/17/18 1202  GLUCAP 203* 140* 127* 123* 163*   Lipid Profile: No results for input(s): CHOL, HDL, LDLCALC, TRIG, CHOLHDL, LDLDIRECT in the last 72 hours. Thyroid Function Tests: No results for input(s): TSH, T4TOTAL, FREET4, T3FREE, THYROIDAB in the last 72 hours. Anemia Panel: No results for input(s): VITAMINB12, FOLATE, FERRITIN, TIBC, IRON, RETICCTPCT in the last 72 hours. Sepsis Labs: No results for input(s): PROCALCITON, LATICACIDVEN in the last 168 hours.  Recent Results (from the past 240 hour(s))  Blood culture (routine x 2)     Status: Abnormal   Collection Time: 05/08/18  6:09 AM  Result Value Ref Range Status   Specimen Description   Final    BLOOD RIGHT ARM Performed at [facility] [address] [place], [place] [address]    Special Requests   Final    BOTTLES DRAWN AEROBIC AND ANAEROBIC Blood Culture adequate volume Performed at [facility] [address], [place], [place] [address]    Culture  Setup Time   Final    GRAM NEGATIVE RODS IN BOTH AEROBIC AND ANAEROBIC BOTTLES CRITICAL VALUE NOTED.  VALUE IS CONSISTENT WITH PREVIOUSLY REPORTED AND CALLED VALUE.    Culture (Ilhan Debenedetto)  Final    ESCHERICHIA COLI SUSCEPTIBILITIES PERFORMED ON PREVIOUS CULTURE WITHIN THE LAST 5 DAYS. Performed at [facility], [place] [address],  [place], [place] [address]    Report Status 05/10/2018 FINAL  Final  Blood culture (routine x 2)     Status: Abnormal   Collection Time:  05/08/18  6:16 AM  Result Value Ref Range Status   Specimen Description   Final    BLOOD LEFT HAND Performed at [facility] [address] [place], [place] [address]    Special Requests   Final    BOTTLES DRAWN AEROBIC AND ANAEROBIC Blood Culture adequate volume Performed at [facility] [address] [place], [place] [address]    Culture  Setup Time   Final    GRAM NEGATIVE RODS IN BOTH AEROBIC AND ANAEROBIC BOTTLES CRITICAL RESULT CALLED TO, READ BACK BY AND VERIFIED WITH: B.GREEN,PHARMD AT 0026 ON 05/09/18 BY G.MCADOO Performed at [facility], [place] [address], [place], [place] [address]    Culture ESCHERICHIA COLI (Lessie Funderburke)  Final   Report Status 05/10/2018 FINAL  Final   Organism ID, Bacteria ESCHERICHIA COLI  Final      Susceptibility   Escherichia coli - MIC*    AMPICILLIN >=32 RESISTANT Resistant     CEFAZOLIN >=64 RESISTANT Resistant     CEFEPIME <=1 SENSITIVE Sensitive     CEFTAZIDIME <=1 SENSITIVE Sensitive     CEFTRIAXONE <=1 SENSITIVE Sensitive     CIPROFLOXACIN <=0.25 SENSITIVE Sensitive     GENTAMICIN <=1 SENSITIVE Sensitive     IMIPENEM <=0.25 SENSITIVE Sensitive     TRIMETH/SULFA >=320 RESISTANT Resistant     AMPICILLIN/SULBACTAM >=32 RESISTANT Resistant     PIP/TAZO 64 INTERMEDIATE Intermediate     Extended ESBL NEGATIVE Sensitive     * ESCHERICHIA COLI  Blood Culture ID Panel (Reflexed)     Status: Abnormal   Collection Time: 05/08/18  6:16 AM  Result Value Ref Range Status   Enterococcus species NOT DETECTED NOT DETECTED Final   Listeria monocytogenes NOT DETECTED NOT DETECTED Final   Staphylococcus species NOT DETECTED NOT DETECTED Final   Staphylococcus aureus (BCID) NOT DETECTED NOT DETECTED Final   Streptococcus species NOT DETECTED NOT DETECTED Final   Streptococcus agalactiae  NOT DETECTED NOT DETECTED Final   Streptococcus pneumoniae NOT DETECTED NOT DETECTED Final   Streptococcus pyogenes NOT DETECTED NOT DETECTED Final   Acinetobacter baumannii NOT DETECTED NOT DETECTED Final   Enterobacteriaceae species DETECTED (Ronnald Shedden) NOT DETECTED Final    Comment: Enterobacteriaceae represent Myeesha Shane large family of gram-negative bacteria, not Aleiya Rye single organism. CRITICAL RESULT CALLED TO, READ BACK BY AND VERIFIED WITH: B.GREEN,PHARMD AT 0026 ON 05/09/18 BY G.MCADOO    Enterobacter cloacae complex NOT DETECTED NOT DETECTED Final   Escherichia coli DETECTED (Catalaya Garr) NOT DETECTED Final    Comment: CRITICAL RESULT CALLED TO, READ BACK BY AND VERIFIED WITH: B.GREEN,PHARMD AT 0026 ON 05/09/18 BY G.MCADOO    Klebsiella oxytoca NOT DETECTED NOT DETECTED Final   Klebsiella pneumoniae NOT DETECTED NOT DETECTED Final   Proteus species NOT DETECTED NOT DETECTED Final   Serratia marcescens NOT DETECTED NOT DETECTED Final   Carbapenem resistance NOT DETECTED NOT DETECTED Final   Haemophilus influenzae NOT DETECTED NOT DETECTED Final   Neisseria meningitidis NOT DETECTED NOT DETECTED Final   Pseudomonas aeruginosa NOT DETECTED NOT DETECTED Final   Candida albicans NOT DETECTED NOT DETECTED Final   Candida glabrata NOT DETECTED NOT DETECTED Final   Candida krusei NOT DETECTED NOT DETECTED Final   Candida parapsilosis NOT DETECTED NOT DETECTED Final   Candida tropicalis NOT DETECTED NOT DETECTED Final    Comment: Performed at [facility], [place] [address], [place], [place] [address]  Culture, sputum-assessment     Status: None   Collection  Time: 05/09/18  2:09 PM  Result Value Ref Range Status   Specimen Description SPUTUM  Final   Special Requests NONE  Final   Sputum evaluation   Final    THIS SPECIMEN IS ACCEPTABLE FOR SPUTUM CULTURE Performed at [facility], [place] [address], [place], [place] [address]    Report Status 05/09/2018 FINAL  Final  MRSA PCR Screening      Status: None   Collection Time: 05/09/18  2:09 PM  Result Value Ref Range Status   MRSA by PCR NEGATIVE NEGATIVE Final    Comment:        The GeneXpert MRSA Assay (FDA approved for NASAL specimens only), is one component of Aragorn Recker comprehensive MRSA colonization surveillance program. It is not intended to diagnose MRSA infection nor to guide or monitor treatment for MRSA infections. Performed at [facility], [place] [address] [place], [place] [address]   Culture, respiratory     Status: None   Collection Time: 05/09/18  2:09 PM  Result Value Ref Range Status   Specimen Description   Final    SPUTUM Performed at [facility] [address], [place], [place] [address]    Special Requests   Final    NONE Reflexed from [phone] Performed at [facility], [place] [address], [place], [place] [address]    Gram Stain   Final    ABUNDANT WBC PRESENT, PREDOMINANTLY PMN RARE GRAM POSITIVE COCCI RARE GRAM NEGATIVE COCCOBACILLI    Culture   Final    RARE Consistent with normal respiratory flora. Performed at [facility], [place] [address], [place], [place] [address]    Report Status 05/11/2018 FINAL  Final  Aerobic/Anaerobic Culture (surgical/deep wound)     Status: None   Collection Time: 05/10/18  4:44 PM  Result Value Ref Range Status   Specimen Description   Final    GALL BLADDER Performed at [facility] [address], [place], [place] [address]    Special Requests NONE  Final   Gram Stain   Final    RARE WBC PRESENT, PREDOMINANTLY MONONUCLEAR NO ORGANISMS SEEN    Culture   Final    No growth aerobically or anaerobically. Performed at [facility], [place] [address], [place],  [address]    Report Status 05/16/2018 FINAL  Final         Radiology Studies: Dg Chest Port 1 View  Result Date: 05/17/2018 CLINICAL DATA:  Leukocytosis. EXAM: PORTABLE CHEST 1 VIEW COMPARISON:  Radiographs  and CT scan of May 11, 2018. FINDINGS: Stable cardiomediastinal silhouette. Status post coronary artery bypass graft. Left-sided pacemaker is unchanged in position. No pneumothorax is noted. Stable bibasilar subsegmental atelectasis or edema is noted with small pleural effusions, left greater than right. Bony thorax is unremarkable. IMPRESSION: Stable bibasilar subsegmental atelectasis or edema is noted with small associated pleural effusions. Electronically Signed   By: Marijo Conception, M.D.   On: 05/17/2018 07:57        Scheduled Meds: . acetaminophen  1,000 mg Oral Q8H  . amiodarone  200 mg Oral BID  . apixaban  2.5 mg Oral BID  . budesonide  0.25 mg Nebulization BID  . chlorhexidine  15 mL Mouth Rinse BID  . guaiFENesin  1,200 mg Oral BID  . insulin aspart  0-9 Units Subcutaneous TID WC  . ipratropium-albuterol  3 mL Nebulization TID  . mouth rinse  15 mL Mouth  Rinse q12n4p  . metoprolol tartrate  12.5 mg Oral BID  . pantoprazole  40 mg Oral QHS  . sertraline  25 mg Oral Daily  . sodium chloride flush  5 mL Intracatheter Q12H   Continuous Infusions: . sodium chloride 250 mL (05/15/18 1736)  . cefTRIAXone (ROCEPHIN)  IV 2 g (05/16/18 2256)  . metronidazole 500 mg (05/17/18 0927)     LOS: 9 days    Time spent: over 74 min    Fayrene Helper, MD [facility] Pager [phone]   If 7PM-7AM, please contact night-coverage [email or web address] Password [email or web address] 05/17/2018, 2:04 PM

## 2018-05-18 LAB — COMPREHENSIVE METABOLIC PANEL
ALBUMIN: 2.7 g/dL — AB (ref 3.5–5.0)
ALK PHOS: 58 U/L (ref 38–126)
ALT: 12 U/L (ref 0–44)
AST: 16 U/L (ref 15–41)
Anion gap: 9 (ref 5–15)
BUN: 23 mg/dL (ref 8–23)
CO2: 22 mmol/L (ref 22–32)
Calcium: 8.7 mg/dL — ABNORMAL LOW (ref 8.9–10.3)
Chloride: 109 mmol/L (ref 98–111)
Creatinine, Ser: 1.23 mg/dL (ref 0.61–1.24)
GFR calc Af Amer: 60 mL/min — ABNORMAL LOW (ref >60)
GFR calc non Af Amer: 52 mL/min — ABNORMAL LOW (ref >60)
GLUCOSE: 126 mg/dL — AB (ref 70–99)
Potassium: 4.4 mmol/L (ref 3.5–5.1)
Sodium: 140 mmol/L (ref 135–145)
Total Bilirubin: 0.5 mg/dL (ref 0.3–1.2)
Total Protein: 5.9 g/dL — ABNORMAL LOW (ref 6.5–8.1)

## 2018-05-18 LAB — CBC WITH DIFFERENTIAL/PLATELET
Abs Immature Granulocytes: 2.57 10*3/uL — ABNORMAL HIGH (ref 0.00–0.07)
BASOS PCT: 0 %
Basophils Absolute: 0 10*3/uL (ref 0.0–0.1)
Eosinophils Absolute: 0.1 10*3/uL (ref 0.0–0.5)
Eosinophils Relative: 1 %
HCT: 28.6 % — ABNORMAL LOW (ref 39.0–52.0)
HEMOGLOBIN: 8.5 g/dL — AB (ref 13.0–17.0)
Immature Granulocytes: 20 %
LYMPHS ABS: 0.5 10*3/uL — AB (ref 0.7–4.0)
Lymphocytes Relative: 4 %
MCH: 31.5 pg (ref 26.0–34.0)
MCHC: 29.7 g/dL — ABNORMAL LOW (ref 30.0–36.0)
MCV: 105.9 fL — ABNORMAL HIGH (ref 80.0–100.0)
Monocytes Absolute: 2.4 10*3/uL — ABNORMAL HIGH (ref 0.1–1.0)
Monocytes Relative: 19 %
Neutro Abs: 7.3 10*3/uL (ref 1.7–7.7)
Neutrophils Relative %: 56 %
Platelets: 119 10*3/uL — ABNORMAL LOW (ref 150–400)
RBC: 2.7 MIL/uL — AB (ref 4.22–5.81)
RDW: 16.9 % — ABNORMAL HIGH (ref 11.5–15.5)
WBC: 13 10*3/uL — ABNORMAL HIGH (ref 4.0–10.5)
nRBC: 0.2 % (ref 0.0–0.2)

## 2018-05-18 LAB — GLUCOSE, CAPILLARY
GLUCOSE-CAPILLARY: 124 mg/dL — AB (ref 70–99)
Glucose-Capillary: 144 mg/dL — ABNORMAL HIGH (ref 70–99)
Glucose-Capillary: 139 mg/dL — ABNORMAL HIGH (ref 70–99)
Glucose-Capillary: 185 mg/dL — ABNORMAL HIGH (ref 70–99)

## 2018-05-18 LAB — MAGNESIUM: Magnesium: 2.3 mg/dL (ref 1.7–2.4)

## 2018-05-18 NOTE — Progress Notes (Signed)
PROGRESS NOTE    Eric [name]  JOA:[id number] DOB: Dec 10, 1926 DOA: 05/08/2018 PCP: [name], MD   Brief Narrative:  Eric [name] a 84 y.o.malewith medical history significant forcoronary artery disease and aortic sclerosis hypertension hyperlipidemia, diabetes, COPD, O2 dependent on 3 L at home, chronic respiratory failure on oxygen, ischemic cardiomyopathy, atrial fibrillation on Eliquis. Patient presented with right upper quadrant pain concerning for acute cholecystitis, but also found to have evidence of bilateral pneumonia and positive blood cultures for E. Coli. Pt admitted for further management.   Assessment & Plan:   E. Coli Bacteremia  Acute Cholecystitis Patient remains afebrile.  WBC still elevated but improved.   E coli bacteremia likely 2/2 acute cholecystitis.  CT scan, RUQ [place], HIDA concerning for cholecystitis.  Now s/p perc drain placement. - Blood cx from 12/1 with e. Coli sensitive to ceftriaxone - S/p cefepime 12/1-12/3.  Continue ceftriaxone and flagyl  (12/3 - present).  Will need 7-14 day course for bacteremia.   - General surgery recommendations: perc tube per IR - Percutaneous drain per IR - recommending GB drain to remain in place 4-6 weeks unless cholecystectomy done in interim  - PO pain management  Bilateral pneumonia  Acute Hypoxic Respiratory Failure Seen on CT abdomen/pelvis from admission on 12/1 (of note, at that time, concern for possible empyema on L side) - MRSA PCR negative - CXR 12/4, notable for small bilateral pleural effusions CT chest done on 12/4 showed: Consolidation in the lower lobes bilaterally as well as right middle lobe most compatible with pneumonia -Patient has been on ceftriaxone/flagyl.  Respiratory status is stable.  Chest x-ray done on 12/10 showed stable findings.  COPD exacerbation/OSA Acute on chronic respiratory failure On 3L oxygen at home -Continue Duoneb, completed steroids Continue home  CPAP  Acute on chronic systolic and diastolic HF (60/06/930 EF mildly reduced to 45%-50%) - CXR 12/10 concerning for edema vs atelecatasis -Continue diuretics.  Renal function is stable. - Daily weights and strict in and out  AKI on CKD stage III:  baseline creatinine around 1.3 - 1.5 Creatinine close to baseline.  Atrial fibrillation with RVR Rate is controlled.  Continue Eliquis.  Continue amiodarone and metoprolol.  Diltiazem is on hold currently  Macrocytic anemia Likely iron deficiency anemia with anemia of chronic disease as well (low iron and percent sat, normal ferritin, folate, b12) Start iron when able  Thrombocytopenia Platelet counts are stable.  Essential hypertension Blood pressure is reasonably well controlled.  Diabetes mellitus, type 2 Continue SSI  Obesity Body mass index is 36.8 kg/m.  Hypokalemia Potassium is normal today.  [place] [facility] consulted, appreciate recs - recommending outpatient palliative care to follow at discharge  ?Depression No prior hx Psychiatry consulted, appreciate recs (recommended d/c remeron, start zoloft, CPAP) Started pt on zoloft   DVT prophylaxis: eliquis Code Status: full  Family Communication: none at bedside Disposition Plan: pending further improvement   Consultants:   Surgery  Psych  IR  Palliatve  Procedures:  Perc chole 12/3 by IR  Antimicrobials:  Anti-infectives (From admission, onward)   Start     Dose/Rate Route Frequency Ordered Stop   05/10/18 2200  cefTRIAXone (ROCEPHIN) 2 g in sodium chloride 0.9 % 100 mL IVPB     2 g 200 mL/hr over 30 Minutes Intravenous Every 24 hours 05/10/18 1417     05/10/18 0600  vancomycin (VANCOCIN) 1,500 mg in sodium chloride 0.9 % 500 mL IVPB  Status:  Discontinued  1,500 mg 250 mL/hr over 120 Minutes Intravenous Every 48 hours 05/08/18 1240 05/10/18 0851   05/09/18 0630  ceFEPIme (MAXIPIME) 2 g in sodium chloride 0.9 % 100 mL IVPB  Status:   Discontinued     2 g 200 mL/hr over 30 Minutes Intravenous Every 12 hours 05/09/18 0209 05/10/18 1417   05/08/18 2100  vancomycin (VANCOCIN) IVPB 1000 mg/200 mL premix  Status:  Discontinued     1,000 mg 200 mL/hr over 60 Minutes Intravenous  Once 05/08/18 2047 05/08/18 2057   05/08/18 1030  vancomycin (VANCOCIN) IVPB 1000 mg/200 mL premix     1,000 mg 200 mL/hr over 60 Minutes Intravenous  Once 05/08/18 0954 05/08/18 1151   05/08/18 0800  metroNIDAZOLE (FLAGYL) IVPB 500 mg     500 mg 100 mL/hr over 60 Minutes Intravenous Every 8 hours 05/08/18 0637     05/08/18 0700  ceFEPIme (MAXIPIME) 1 g in sodium chloride 0.9 % 100 mL IVPB  Status:  Discontinued     1 g 200 mL/hr over 30 Minutes Intravenous Every 8 hours 05/08/18 0637 05/09/18 0209   05/08/18 0615  vancomycin (VANCOCIN) IVPB 1000 mg/200 mL premix     1,000 mg 200 mL/hr over 60 Minutes Intravenous  Once 05/08/18 0603 05/08/18 0749   05/08/18 0615  piperacillin-tazobactam (ZOSYN) IVPB 3.375 g  Status:  Discontinued     3.375 g 100 mL/hr over 30 Minutes Intravenous  Once 05/08/18 0603 05/08/18 4097      Subjective: Patient concerned about his generalized weakness.  He was told that the recovery will be prolonged.  Objective: Vitals:   05/17/18 2126 05/18/18 0518 05/18/18 0756 05/18/18 1259  BP: 133/68 119/61  133/79  Pulse: 79 80  95  Resp: 18 18  18   Temp: 98.1 F (36.7 C) 98.2 F (36.8 C)  98.1 F (36.7 C)  TempSrc: Oral Oral  Oral  SpO2: 99% 99% 98% 98%  Weight:      Height:        Intake/Output Summary (Last 24 hours) at 05/18/2018 1428 Last data filed at 05/18/2018 1220 Gross per 24 hour  Intake 5 ml  Output 2225 ml  Net -2220 ml   Filed Weights   05/15/18 0612 05/16/18 0416 05/17/18 0413  Weight: 102.9 kg 103.2 kg 103.1 kg    Examination:  General exam: Awake alert.  In no distress Respiratory system: Coarse breath sounds bilaterally.  No wheezing rales or rhonchi Cardiovascular system: S1-S2 is  normal regular.  No S3-S4.  No rubs murmurs or bruit Gastrointestinal system: Abdomen is soft.  Nondistended.  Nontender.  No masses or organomegaly Central nervous system: Alert and oriented x3.  No focal neurological deficits.  Generalized weakness is appreciated. Extremities: Trace dependent edema and LE edema    Data Reviewed: I have personally reviewed following labs and imaging studies  CBC: Recent Labs  Lab 05/14/18 0549 05/15/18 0602 05/16/18 0825 05/17/18 0536 05/18/18 0541  WBC 10.4 14.4* 14.5* 12.6* 13.0*  NEUTROABS 6.0 8.7* 11.3* 7.5 7.3  HGB 9.1* 8.7* 8.7* 8.5* 8.5*  HCT 31.5* 29.0* 29.9* 29.4* 28.6*  MCV 104.3* 102.5* 105.7* 103.5* 105.9*  PLT 146* 164 137* 116* 353*   Basic Metabolic Panel: Recent Labs  Lab 05/12/18 0554  05/14/18 0549 05/15/18 0602 05/16/18 0825 05/17/18 0536 05/18/18 0541  NA 145   < > 140 140 141 140 140  K 3.4*   < > 3.5 4.5 4.4 4.4 4.4  CL 107   < >  104 104 109 109 109  CO2 29   < > 24 28 24 26 22   GLUCOSE 221*   < > 167* 169* 136* 129* 126*  BUN 63*   < > 60* 51* 38* 34* 23  CREATININE 1.79*   < > 1.64* 1.47* 1.23 1.39* 1.23  CALCIUM 9.4   < > 9.0 9.2 9.1 8.9 8.7*  MG 2.6*  --   --   --   --   --  2.3   < > = values in this interval not displayed.   GFR: Estimated Creatinine Clearance: 45.8 mL/min (by C-G formula based on SCr of 1.23 mg/dL). Liver Function Tests: Recent Labs  Lab 05/12/18 0554 05/18/18 0541  AST 16 16  ALT 19 12  ALKPHOS 89 58  BILITOT 0.7 0.5  PROT 6.8 5.9*  ALBUMIN 2.9* 2.7*   CBG: Recent Labs  Lab 05/17/18 1202 05/17/18 1640 05/17/18 2131 05/18/18 0720 05/18/18 1142  GLUCAP 163* 161* 145* 124* 144*     Recent Results (from the past 240 hour(s))  Culture, sputum-assessment     Status: None   Collection Time: 05/09/18  2:09 PM  Result Value Ref Range Status   Specimen Description SPUTUM  Final   Special Requests NONE  Final   Sputum evaluation   Final    THIS SPECIMEN IS ACCEPTABLE FOR  SPUTUM CULTURE Performed at [facility], [place] [address] [place], [place] [address]    Report Status 05/09/2018 FINAL  Final  MRSA PCR Screening     Status: None   Collection Time: 05/09/18  2:09 PM  Result Value Ref Range Status   MRSA by PCR NEGATIVE NEGATIVE Final    Comment:        The GeneXpert MRSA Assay (FDA approved for NASAL specimens only), is one component of a comprehensive MRSA colonization surveillance program. It is not intended to diagnose MRSA infection nor to guide or monitor treatment for MRSA infections. Performed at [facility], [place] [address] [place], [place] [address]   Culture, respiratory     Status: None   Collection Time: 05/09/18  2:09 PM  Result Value Ref Range Status   Specimen Description   Final    SPUTUM Performed at [place] [address] [place], [place] [address]    Special Requests   Final    NONE Reflexed from [id number] Performed at [facility], [place] [address], [place], [place] [address]    Gram Stain   Final    ABUNDANT WBC PRESENT, PREDOMINANTLY PMN RARE GRAM POSITIVE COCCI RARE GRAM NEGATIVE COCCOBACILLI    Culture   Final    RARE Consistent with normal respiratory flora. Performed at [facility], [place] [address] [place], [place] [address]    Report Status 05/11/2018 FINAL  Final  Aerobic/Anaerobic Culture (surgical/deep wound)     Status: None   Collection Time: 05/10/18  4:44 PM  Result Value Ref Range Status   Specimen Description   Final    GALL BLADDER Performed at [place] [address] [place], [place] [address]    Special Requests NONE  Final   Gram Stain   Final    RARE WBC PRESENT, PREDOMINANTLY MONONUCLEAR NO ORGANISMS SEEN    Culture   Final    No growth aerobically or anaerobically. Performed at [facility], [place] [address] [place], [place] [address]    Report Status 05/16/2018  FINAL   Final         Radiology Studies: Dg Chest Port 1 View  Result Date: 05/17/2018 CLINICAL DATA:  Leukocytosis. EXAM: PORTABLE CHEST 1 VIEW COMPARISON:  Radiographs and CT scan of May 11, 2018. FINDINGS: Stable cardiomediastinal silhouette. Status post coronary artery bypass graft. Left-sided pacemaker is unchanged in position. No pneumothorax is noted. Stable bibasilar subsegmental atelectasis or edema is noted with small pleural effusions, left greater than right. Bony thorax is unremarkable. IMPRESSION: Stable bibasilar subsegmental atelectasis or edema is noted with small associated pleural effusions. Electronically Signed   By: Marijo Conception, M.D.   On: 05/17/2018 07:57        Scheduled Meds: . acetaminophen  1,000 mg Oral Q8H  . amiodarone  200 mg Oral BID  . apixaban  2.5 mg Oral BID  . budesonide  0.25 mg Nebulization BID  . chlorhexidine  15 mL Mouth Rinse BID  . furosemide  40 mg Oral BID  . guaiFENesin  1,200 mg Oral BID  . insulin aspart  0-9 Units Subcutaneous TID WC  . ipratropium-albuterol  3 mL Nebulization TID  . mouth rinse  15 mL Mouth Rinse q12n4p  . metoprolol tartrate  12.5 mg Oral BID  . pantoprazole  40 mg Oral QHS  . sertraline  25 mg Oral Daily  . sodium chloride flush  5 mL Intracatheter Q12H   Continuous Infusions: . sodium chloride 250 mL (05/18/18 0924)  . cefTRIAXone (ROCEPHIN)  IV 2 g (05/17/18 2222)  . metronidazole 500 mg (05/18/18 0926)     LOS: 10 days     Bonnielee Haff, MD [facility] Pager [phone]   If 7PM-7AM, please contact night-coverage [email or web address] Password [email or web address] 05/18/2018, 2:28 PM

## 2018-05-18 NOTE — Progress Notes (Signed)
   05/18/18 1531  Clinical Encounter Type  Visited With Patient  Visit Type Follow-up  Referral From Chaplain  Consult/Referral To Chaplain  The chaplain followed up with a spiritual care visit with the Pt.  The Pt. is aware of his discharge to [facility] tomorrow morning. The Pt. was not satisfied with the quality of care the last time he was at [facility].  The Pt. remains focused on receiving his daughters' forgiveness at this time in his life.  The chaplain heard the Pt.'s willingness to try to contact his daughters once he transitions to [place].  The Pt. and chaplain began to explore God's role in forgiveness.   The chaplain exited after prayer.

## 2018-05-18 NOTE — Progress Notes (Signed)
  Speech Language Pathology Treatment: Dysphagia  Patient Details Name: Eric [name] MRN: [id number] DOB: June 04, 1927 Today's Date: 05/18/2018 Time: 9244-6286 SLP Time Calculation (min) (ACUTE ONLY): 11 min  Assessment / Plan / Recommendation Clinical Impression  Pt had just finished his breakfast and did not want further intake.  He denies coughing with breakfast stating it went well but was observed to belch frequently during session.  Pt previously had admitted to issues with liquids coming back up, denies this occurring at this time and advises he "eats slowly'.    Provided him with esophageal precautions and xerostomia s/s and compensations in writing which pt read through and confirmed.  Recommend continue current diet but again advised he consume several small meals given concern for possible esophageal dysphagia. Note results of CXR with stable small pleural effusions = CXR done due to pt's leukocytosis.    Will follow up potentially for Respiratory Muscle Strength Training to maximize swallow/respiratory recovery/strength.  Thanks for allowing me to help assist with this pt's care.     HPI HPI: Eric [name] is a 84 y.o. male with medical history significant for coronary artery disease and aortic sclerosis hypertension hyperlipidemia, diabetes, COPD, O2 dependent on 3 L at home, chronic respiratory failure on oxygen, ischemic cardiomyopathy, atrial fibrillation on Eliquis. Patient presented with right upper quadrant pain, dx with acute cholecystitis s/p cholecystectomy 12/4. Also found to have evidence of bilateral pneumonia and positive blood cultures for E. Coli.      SLP Plan  Continue with current plan of care       Recommendations  Diet recommendations: Dysphagia 3 (mechanical soft);Thin liquid Liquids provided via: Cup;Straw Medication Administration: Whole meds with puree(follow with liquids) Compensations: Slow rate;Small sips/bites;Follow solids with liquid(small  frequent meals) Postural Changes and/or Swallow Maneuvers: Seated upright 90 degrees;Upright 30-60 min after meal                Oral Care Recommendations: Oral care BID Follow up Recommendations: Skilled Nursing facility SLP Visit Diagnosis: Dysphagia, pharyngoesophageal phase (R13.14);Dysphagia, unspecified (R13.10) Plan: Continue with current plan of care       [place], [place] [facility] SLP Acute Rehab Services Pager [phone] Office [phone]   Macario Golds 05/18/2018, 11:27 AM

## 2018-05-19 DIAGNOSIS — I10 Essential (primary) hypertension: Secondary | ICD-10-CM | POA: Diagnosis not present

## 2018-05-19 DIAGNOSIS — M2550 Pain in unspecified joint: Secondary | ICD-10-CM | POA: Diagnosis not present

## 2018-05-19 DIAGNOSIS — A419 Sepsis, unspecified organism: Secondary | ICD-10-CM | POA: Diagnosis not present

## 2018-05-19 DIAGNOSIS — N183 Chronic kidney disease, stage 3 (moderate): Secondary | ICD-10-CM | POA: Diagnosis not present

## 2018-05-19 DIAGNOSIS — A4151 Sepsis due to Escherichia coli [E. coli]: Secondary | ICD-10-CM | POA: Diagnosis not present

## 2018-05-19 DIAGNOSIS — R0602 Shortness of breath: Secondary | ICD-10-CM | POA: Diagnosis not present

## 2018-05-19 DIAGNOSIS — M25562 Pain in left knee: Secondary | ICD-10-CM | POA: Diagnosis not present

## 2018-05-19 DIAGNOSIS — R1312 Dysphagia, oropharyngeal phase: Secondary | ICD-10-CM | POA: Diagnosis not present

## 2018-05-19 DIAGNOSIS — F4321 Adjustment disorder with depressed mood: Secondary | ICD-10-CM | POA: Diagnosis not present

## 2018-05-19 DIAGNOSIS — E46 Unspecified protein-calorie malnutrition: Secondary | ICD-10-CM | POA: Diagnosis not present

## 2018-05-19 DIAGNOSIS — F329 Major depressive disorder, single episode, unspecified: Secondary | ICD-10-CM | POA: Diagnosis not present

## 2018-05-19 DIAGNOSIS — R634 Abnormal weight loss: Secondary | ICD-10-CM | POA: Diagnosis not present

## 2018-05-19 DIAGNOSIS — G4733 Obstructive sleep apnea (adult) (pediatric): Secondary | ICD-10-CM | POA: Diagnosis not present

## 2018-05-19 DIAGNOSIS — J449 Chronic obstructive pulmonary disease, unspecified: Secondary | ICD-10-CM | POA: Diagnosis not present

## 2018-05-19 DIAGNOSIS — R1310 Dysphagia, unspecified: Secondary | ICD-10-CM | POA: Diagnosis not present

## 2018-05-19 DIAGNOSIS — J189 Pneumonia, unspecified organism: Secondary | ICD-10-CM | POA: Diagnosis not present

## 2018-05-19 DIAGNOSIS — R52 Pain, unspecified: Secondary | ICD-10-CM | POA: Diagnosis not present

## 2018-05-19 DIAGNOSIS — J9611 Chronic respiratory failure with hypoxia: Secondary | ICD-10-CM | POA: Diagnosis not present

## 2018-05-19 DIAGNOSIS — F0390 Unspecified dementia without behavioral disturbance: Secondary | ICD-10-CM | POA: Diagnosis not present

## 2018-05-19 DIAGNOSIS — R531 Weakness: Secondary | ICD-10-CM | POA: Diagnosis not present

## 2018-05-19 DIAGNOSIS — D649 Anemia, unspecified: Secondary | ICD-10-CM | POA: Diagnosis not present

## 2018-05-19 DIAGNOSIS — J441 Chronic obstructive pulmonary disease with (acute) exacerbation: Secondary | ICD-10-CM | POA: Diagnosis not present

## 2018-05-19 DIAGNOSIS — R278 Other lack of coordination: Secondary | ICD-10-CM | POA: Diagnosis not present

## 2018-05-19 DIAGNOSIS — I4891 Unspecified atrial fibrillation: Secondary | ICD-10-CM | POA: Diagnosis not present

## 2018-05-19 DIAGNOSIS — I5042 Chronic combined systolic (congestive) and diastolic (congestive) heart failure: Secondary | ICD-10-CM | POA: Diagnosis not present

## 2018-05-19 DIAGNOSIS — R7881 Bacteremia: Secondary | ICD-10-CM | POA: Diagnosis not present

## 2018-05-19 DIAGNOSIS — R58 Hemorrhage, not elsewhere classified: Secondary | ICD-10-CM | POA: Diagnosis not present

## 2018-05-19 DIAGNOSIS — I509 Heart failure, unspecified: Secondary | ICD-10-CM | POA: Diagnosis not present

## 2018-05-19 DIAGNOSIS — C9290 Myeloid leukemia, unspecified, not having achieved remission: Secondary | ICD-10-CM | POA: Diagnosis not present

## 2018-05-19 DIAGNOSIS — E1122 Type 2 diabetes mellitus with diabetic chronic kidney disease: Secondary | ICD-10-CM | POA: Diagnosis not present

## 2018-05-19 DIAGNOSIS — B9620 Unspecified Escherichia coli [E. coli] as the cause of diseases classified elsewhere: Secondary | ICD-10-CM | POA: Diagnosis not present

## 2018-05-19 DIAGNOSIS — E1169 Type 2 diabetes mellitus with other specified complication: Secondary | ICD-10-CM | POA: Diagnosis not present

## 2018-05-19 DIAGNOSIS — J9610 Chronic respiratory failure, unspecified whether with hypoxia or hypercapnia: Secondary | ICD-10-CM | POA: Diagnosis not present

## 2018-05-19 DIAGNOSIS — Z7401 Bed confinement status: Secondary | ICD-10-CM | POA: Diagnosis not present

## 2018-05-19 DIAGNOSIS — R2689 Other abnormalities of gait and mobility: Secondary | ICD-10-CM | POA: Diagnosis not present

## 2018-05-19 DIAGNOSIS — F411 Generalized anxiety disorder: Secondary | ICD-10-CM | POA: Diagnosis not present

## 2018-05-19 DIAGNOSIS — Z950 Presence of cardiac pacemaker: Secondary | ICD-10-CM | POA: Diagnosis not present

## 2018-05-19 DIAGNOSIS — Z7189 Other specified counseling: Secondary | ICD-10-CM | POA: Diagnosis not present

## 2018-05-19 DIAGNOSIS — I2510 Atherosclerotic heart disease of native coronary artery without angina pectoris: Secondary | ICD-10-CM | POA: Diagnosis not present

## 2018-05-19 DIAGNOSIS — K810 Acute cholecystitis: Secondary | ICD-10-CM | POA: Diagnosis not present

## 2018-05-19 LAB — GLUCOSE, CAPILLARY
Glucose-Capillary: 135 mg/dL — ABNORMAL HIGH (ref 70–99)
Glucose-Capillary: 126 mg/dL — ABNORMAL HIGH (ref 70–99)

## 2018-05-19 MED ORDER — HYDROCODONE-ACETAMINOPHEN 7.5-325 MG PO TABS: 1.0000 | ORAL_TABLET | Freq: Four times a day (QID) | ORAL | 0 refills | Status: AC | PRN

## 2018-05-19 MED ORDER — FERROUS SULFATE 325 (65 FE) MG PO TABS: 325.0000 mg | ORAL_TABLET | Freq: Every day | ORAL | 3 refills | Status: AC

## 2018-05-19 MED ORDER — CEFPODOXIME PROXETIL 200 MG PO TABS: 200.0000 mg | ORAL_TABLET | Freq: Two times a day (BID) | ORAL | Status: AC

## 2018-05-19 MED ORDER — GUAIFENESIN ER 600 MG PO TB12: 1200.0000 mg | ORAL_TABLET | Freq: Two times a day (BID) | ORAL | Status: AC

## 2018-05-19 MED ORDER — SERTRALINE HCL 25 MG PO TABS: 25.0000 mg | ORAL_TABLET | Freq: Every day | ORAL | Status: AC

## 2018-05-19 NOTE — Care Management Important Message (Signed)
Important Message  Patient Details  Name: Eric [name] MRN: [id number] Date of Birth: Mar 17, 1927   Medicare Important Message Given:  Yes    Kerin Salen 05/19/2018, 11:15 AMImportant Message  Patient Details  Name: Eric [name] MRN: [id number] Date of Birth: 11-09-1926   Medicare Important Message Given:  Yes    Kerin Salen 05/19/2018, 11:15 AM

## 2018-05-19 NOTE — Progress Notes (Signed)
Report called to [place]  at [facility]. Awaiting [facility] to transport.

## 2018-05-19 NOTE — Progress Notes (Signed)
Clinical Social Worker facilitated patient discharge including contacting patient family and facility to confirm patient discharge plans.  Clinical information faxed to facility and family agreeable with plan.  CSW arranged ambulance transport via [facility] to [facility] .  RN to call [phone] will go back to rm# [phone]  for report prior to discharge.  Clinical Social Worker will sign off for now as social work intervention is no longer needed. Please consult [place] again if new need arises.  Rhea Pink, MSW, [place]

## 2018-05-19 NOTE — Discharge Summary (Signed)
[facility]  Physician Discharge Summary   Patient ID: Eric [name] MRN: [id number] DOB/AGE: 1926/06/21 84 y.o.  Admit date: 05/08/2018 Discharge date: 05/19/2018  PCP: [name], MD  DISCHARGE DIAGNOSES:  E. coli bacteremia Acute cholecystitis status post percutaneous cholecystostomy drain Bilateral pneumonia, improved Chronic respiratory failure on home oxygen Chronic systolic and diastolic CHF History of COPD History of OSA on CPAP Chronic kidney disease stage III History of chronic atrial fibrillation Microcytic anemia Essential hypertension Diabetes mellitus type 2 Morbid obesity   RECOMMENDATIONS FOR OUTPATIENT FOLLOW UP: 1. Recommend CBC and basic metabolic panel on Monday 44/81 2. Interventional radiology drain clinic will arrange outpatient follow-up 3. Please call Dr. Lear Ng office with general surgery to arrange follow-up in about 4 weeks. 4. Palliative medicine to follow at skilled nursing facility.   DISCHARGE CONDITION: fair  Diet recommendation: Dysphagia 3 diet with thin liquids  Filed Weights   05/16/18 0416 05/17/18 0413 05/19/18 0500  Weight: 103.2 kg 103.1 kg 100.9 kg    INITIAL HISTORY: Eric [name] a 84 y.o.malewith medical history significant forcoronary artery disease and aortic sclerosis hypertension hyperlipidemia, diabetes, COPD, O2 dependent on 3 L at home, chronic respiratory failure on oxygen, ischemic cardiomyopathy, atrial fibrillation on Eliquis. Patient presented with right upper quadrant pain concerning for acute cholecystitis, but also found to have evidence of bilateral pneumonia and positive blood cultures for E. Coli. Pt admitted for further management.  Consultants:   Surgery  Psych  IR  Palliatve  Procedures:  Perc chole 12/3 by IR   HOSPITAL COURSE:   E. Coli Bacteremia  Acute Cholecystitis Bacteremia was most likely from a biliary source.  Imaging studies did raise  concern for acute cholecystitis.  Patient was seen by general surgery.  They recommended percutaneous drain placement by interventional radiology.  This was subsequently performed.  Patient to be reevaluated by interventional radiology in about 5 weeks time and by general surgery in 4 weeks time.  At that time general surgery will discuss cholecystectomy with the patient.  Patient was started on cefepime initially and then transitioned over to ceftriaxone and Flagyl on 12/3.  Patient has completed about 11 days of treatment.  Discussed with infectious disease yesterday who recommends treating with Cefpodoxime for 5 more days.  Patient remains afebrile.  WBC still elevated but improved.    Bilateral pneumonia  Acute Hypoxic Respiratory Failure Seen on CT abdomen/pelvis from admission on 12/1 (of note, at that time, concern for possible empyema on L side). MRSA PCR negative. CXR 12/4, notable for small bilateral pleural effusions. CT chest done on 12/4 showed: Consolidation in the lower lobes bilaterally as well as right middle lobe most compatible with pneumonia. Patient has been on ceftriaxone/flagyl.  Respiratory status is stable.  Chest x-ray done on 12/10 showed stable findings.  Patient is on home oxygen at baseline.  COPD exacerbation/OSA/Chronic respiratory failure On 3L oxygen at home.  Completed a course of steroids.  Continue home CPAP.  Nebulizer treatments as needed.  Acute on chronic systolic and diastolic HF (85/11/3147 EF mildly reduced to 45%-50%) CXR 12/10 concerning for edema vs atelecatasis. Continue diuretics.  Renal function is stable.  AKI on CKD stage III:  Creatinine close to baseline.  Atrial fibrillation with RVR Rate is controlled.  May resume home medications including diltiazem.  Continue amiodarone.  Macrocyticanemia Ferritin was noted to be 294.  TIBC 182.  Iron level 9.  There is no clear evidence for iron deficiency though he may benefit  from low-dose iron  supplements.  Stool softeners while he is taking iron supplements.  Thrombocytopenia Platelet counts are stable.  Essential hypertension Blood pressure is reasonably well controlled.  Diabetes mellitus, type 2 Monitor CBGs at skilled nursing facility.  Continue home medications.  Obesity Body mass index is 36.8 kg/m.  [facility] Palliative medicine was consulted.  They are recommending outpatient palliative medicine follow-up at skilled nursing facility.    Depression No previous history of same.  Psychiatry was consulted who recommended discontinuing Remeron and starting Zoloft.     Okay for discharge to skilled nursing facility today.   PERTINENT LABS:  The results of significant diagnostics from this hospitalization (including imaging, microbiology, ancillary and laboratory) are listed below for reference.    Microbiology: Recent Results (from the past 240 hour(s))  Culture, sputum-assessment     Status: None   Collection Time: 05/09/18  2:09 PM  Result Value Ref Range Status   Specimen Description SPUTUM  Final   Special Requests NONE  Final   Sputum evaluation   Final    THIS SPECIMEN IS ACCEPTABLE FOR SPUTUM CULTURE Performed at [facility], [place] [address] [place], [place] [address]    Report Status 05/09/2018 FINAL  Final  MRSA PCR Screening     Status: None   Collection Time: 05/09/18  2:09 PM  Result Value Ref Range Status   MRSA by PCR NEGATIVE NEGATIVE Final    Comment:        The GeneXpert MRSA Assay (FDA approved for NASAL specimens only), is one component of a comprehensive MRSA colonization surveillance program. It is not intended to diagnose MRSA infection nor to guide or monitor treatment for MRSA infections. Performed at [facility], [place] [address] [place], [place] [address]   Culture, respiratory     Status: None   Collection Time: 05/09/18  2:09 PM  Result Value Ref Range Status   Specimen  Description   Final    SPUTUM Performed at [place] [address] [place], [place] [address]    Special Requests   Final    NONE Reflexed from [phone] Performed at [facility], [place] [address], [place], [place] [address]    Gram Stain   Final    ABUNDANT WBC PRESENT, PREDOMINANTLY PMN RARE GRAM POSITIVE COCCI RARE GRAM NEGATIVE COCCOBACILLI    Culture   Final    RARE Consistent with normal respiratory flora. Performed at [facility], [place] [address] [place], [place] [address]    Report Status 05/11/2018 FINAL  Final  Aerobic/Anaerobic Culture (surgical/deep wound)     Status: None   Collection Time: 05/10/18  4:44 PM  Result Value Ref Range Status   Specimen Description   Final    GALL BLADDER Performed at [place] [address] [place], [place] [address]    Special Requests NONE  Final   Gram Stain   Final    RARE WBC PRESENT, PREDOMINANTLY MONONUCLEAR NO ORGANISMS SEEN    Culture   Final    No growth aerobically or anaerobically. Performed at [facility], [place] [address], [place], [place] [address]    Report Status 05/16/2018 FINAL  Final     Labs: Basic Metabolic Panel: Recent Labs  Lab 05/14/18 0549 05/15/18 0602 05/16/18 0825 05/17/18 0536 05/18/18 0541  NA 140 140 141 140 140  K 3.5 4.5 4.4 4.4 4.4  CL 104 104 109 109  109  CO2 _0 GLUCOSE 167* 169* 136* 129* 126*  BUN 60* 51* 38* 34* 23  CREATININE 1.64* 1.47* 1.23 1.39* 1.23  CALCIUM 9.0 9.2 9.1 8.9 8.7*  MG  --   --   --   --  2.3   Liver Function Tests: Recent Labs  Lab 05/18/18 0541  AST 16  ALT 12  ALKPHOS 58  BILITOT 0.5  PROT 5.9*  ALBUMIN 2.7*   CBC: Recent Labs  Lab 05/14/18 0549 05/15/18 0602 05/16/18 0825 05/17/18 0536 05/18/18 0541  WBC 10.4 14.4* 14.5* 12.6* 13.0*  NEUTROABS 6.0 8.7* 11.3* 7.5 7.3  HGB 9.1* 8.7* 8.7* 8.5* 8.5*  HCT 31.5* 29.0* 29.9* 29.4* 28.6*  MCV 104.3*  102.5* 105.7* 103.5* 105.9*  PLT 146* 164 137* 116* 119*   BNP: BNP (last 3 results) Recent Labs    04/12/18 1135 05/11/18 1337 05/17/18 1432  BNP 483.0* 651.1* 250.4*    CBG: Recent Labs  Lab 05/18/18 1142 05/18/18 1657 05/18/18 2040 05/19/18 0731 05/19/18 1128  GLUCAP 144* 139* 185* 135* 126*     IMAGING STUDIES Ct Chest Wo Contrast  Result Date: 05/11/2018 CLINICAL DATA:  Concern for empyema. Chest pain, shortness of breath. Pleural effusions suspected. EXAM: CT CHEST WITHOUT CONTRAST TECHNIQUE: Multidetector CT imaging of the chest was performed following the standard protocol without IV contrast. COMPARISON:  Chest x-ray 05/11/2018.  Chest CT 06/03/2016. FINDINGS: Cardiovascular: Prior CABG. Cardiomegaly. Diffusely calcified aorta. No aneurysm. Left pacer in place with leads in the right atrium and right ventricle. Mediastinum/Nodes: Borderline sized scattered mediastinal lymph nodes, likely reactive. No axillary or visible hilar adenopathy. Lungs/Pleura: Mild emphysema. Airspace consolidation in both lower lobes as well as the right middle lobe concerning for pneumonia. Small bilateral pleural effusions. Upper Abdomen: Stable low-density lesion in the left hepatic lobe, likely cyst. Musculoskeletal: Chest wall soft tissues are unremarkable. Pacer battery pack in the left chest wall. IMPRESSION: Consolidation in the lower lobes bilaterally as well as right middle lobe most compatible with pneumonia. Small bilateral pleural effusions. Cardiomegaly.  Prior CABG. Aortic Atherosclerosis (ICD10-I70.0) and Emphysema (ICD10-J43.9). Electronically Signed   By: Rolm Baptise M.D.   On: 05/11/2018 22:30   Nm Hepatobiliary Liver Func  Result Date: 05/09/2018 CLINICAL DATA:  Right upper quadrant abdominal pain. EXAM: NUCLEAR MEDICINE HEPATOBILIARY IMAGING TECHNIQUE: Sequential images of the abdomen were obtained out to 60 minutes following intravenous administration of radiopharmaceutical.  RADIOPHARMACEUTICALS:  4.9 mCi Tc-100m Choletec IV COMPARISON:  CT scan and ultrasound of May 08, 2018. FINDINGS: Prompt uptake and biliary excretion of activity by the liver is seen. No filling of the gallbladder is noted. Morphine was not administered due to allergy. Biliary activity passes into small bowel, consistent with patent common bile duct. IMPRESSION: No filling of gallbladder is noted consistent with cystic duct obstruction and possible acute cholecystitis. Electronically Signed   By: JMarijo Conception M.D.   On: 05/09/2018 17:03   Ct Abdomen Pelvis W Contrast  Result Date: 05/08/2018 CLINICAL DATA:  Initial evaluation for acute right upper quadrant pain, cough and congestion. EXAM: CT ABDOMEN AND PELVIS WITH CONTRAST TECHNIQUE: Multidetector CT imaging of the abdomen and pelvis was performed using the standard protocol following bolus administration of intravenous contrast. CONTRAST:  1049mISOVUE-300 IOPAMIDOL (ISOVUE-300) INJECTION 61% COMPARISON:  Prior CT from 12/07/2014. FINDINGS: Lower chest: Dense consolidative opacities present within the bilateral lung bases, right slightly worse than left. Superimposed peripheral pleural based calcifications noted. Associated bilateral  pleural effusions, complex on the left with loculated appearance. Ill-defined density within the left effusion noted (series 2, image 10). Mild cardiomegaly. Cardiac pacemaker electrodes partially visualized. Hepatobiliary: Multiple scattered cysts seen within the liver, largest of which position within the left hepatic lobe in measures 4.4 cm, similar to previous. Liver demonstrates no acute finding. Gallbladder is enlarged and hydropic in appearance with hazy pericholecystic fat stranding, suspicious for possible acute cholecystitis. No radiopaque calculi identified. No biliary dilatation. Pancreas: Diffuse fatty infiltration of the pancreas noted. Pancreas otherwise unremarkable without acute peripancreatic  inflammation. Spleen: Spleen within normal limits. Adrenals/Urinary Tract: Adrenal glands are normal. Kidneys equal in size with symmetric enhancement. 3.4 cm simple left renal cyst noted. Additional scattered subcentimeter hypodensities too small the characterize, but statistically likely reflects small cysts as well. No nephrolithiasis or hydronephrosis. No focal enhancing renal mass. No hydroureter. Partially distended bladder within normal limits. Stomach/Bowel: Stomach within normal limits. No evidence for bowel obstruction. Patient is status post sigmoid colectomy with left lower quadrant colostomy in place. Large parastomal hernia containing fat and loops of small bowel present within the left lower quadrant without associated inflammation or obstruction. Neck of the hernia measures approximately 4.1 cm in diameter. Additional ventral hernia seen just to the right of midline at the paraumbilical region contains fat and a portion of the transverse colon (series 2, image 48). No associated complication. Vascular/Lymphatic: Advanced aorto bi-iliac atherosclerotic disease. No aneurysm. Mesenteric vessels are patent proximally. No adenopathy. Reproductive: Brachytherapy seeds overlie the prostate. Other: No free air or fluid. Musculoskeletal: Right total hip arthroplasty in place. No acute osseus abnormality. No discrete lytic or blastic osseous lesions. IMPRESSION: 1. Enlarged and hydropic gallbladder with hazy pericholecystic fat stranding, suspicious for acute cholecystitis. Correlation with laboratory values recommended. Additionally, further evaluation with dedicated right upper quadrant ultrasound could be performed for further evaluation as clinically warranted. 2. Dense consolidative opacities within the bilateral lung bases, right greater than left, concerning for infiltrates/pneumonia. Associated bilateral pleural effusions, complex in appearance on the left with increased internal density, raising the  possibility for empyema. Please note that a malignant process could also have this appearance. Correlation with fluid analysis may be helpful for further evaluation as clinically warranted. 3. Large left lower quadrant parastomal hernia containing loops of small bowel without associated inflammation or obstruction. 4. Advanced aorto bi-iliac atherosclerotic disease. No aneurysm. Electronically Signed   By: Jeannine Boga M.D.   On: 05/08/2018 05:42   US Abdomen Limited  Result Date: 05/08/2018 CLINICAL DATA:  Initial evaluation for acute right upper quadrant pain. EXAM: ULTRASOUND ABDOMEN LIMITED RIGHT UPPER QUADRANT COMPARISON:  CT from same day. Please note that CT was performed following this examination, although this exam only became available for review after the CT was completed. FINDINGS: Gallbladder: No stones or sludge within the gallbladder lumen. Gallbladder wall measure within normal limits at 2.3 mm. No free pericholecystic fluid. A positive sonographic Murphy sign was elicited on exam. Common bile duct: Diameter: 5 mm Liver: Multiple scattered hepatic cysts noted, largest of which is septated and measures 4.4 cm in the left hepatic lobe. These are grossly stable from prior CTs. Within normal limits in parenchymal echogenicity. Portal vein is patent on color Doppler imaging with normal direction of blood flow towards the liver. IMPRESSION: 1. Positive Murphy sign without additional sonographic features for acute cholecystitis. No cholelithiasis. 2. No biliary dilatation. 3. Multiple scattered hepatic cysts, better evaluated on previous CTs. Electronically Signed   By: Pincus Badder.D.  On: 05/08/2018 06:42   Ir Perc Cholecystostomy  Result Date: 05/10/2018 CLINICAL DATA:  Acute cholecystitis and need for percutaneous cholecystostomy due to high risk currently for cholecystectomy. EXAM: PERCUTANEOUS CHOLECYSTOSTOMY COMPARISON:  CT and ultrasound studies on 05/08/2018  ANESTHESIA/SEDATION: 0.5 mg IV Versed; 50 mcg IV Fentanyl. Total Moderate Sedation Time 17 minutes. The patient's level of consciousness and physiologic status were continuously monitored during the procedure by Radiology nursing. CONTRAST:  13 mL Isovue-300 MEDICATIONS: No additional medications. FLUOROSCOPY TIME:  1 minutes and 12 seconds.  50.9 mGy. PROCEDURE: The procedure, risks, benefits, and alternatives were explained to the patient's daughter. Questions regarding the procedure were encouraged and answered. The patient's daughter understands and consents to the procedure. A time-out was performed prior to initiating the procedure. The right abdominal wall was prepped with chlorhexidine in a sterile fashion, and a sterile drape was applied covering the operative field. A sterile gown and sterile gloves were used for the procedure. Local anesthesia was provided with 1% Lidocaine. Ultrasound image documentation was performed. Fluoroscopy during the procedure and fluoro spot radiograph confirms appropriate catheter position. Ultrasound was utilized to localize the gallbladder. Under direct ultrasound guidance, a 21 gauge needle was advanced via a transhepatic approach into the gallbladder lumen. Aspiration was performed and a bile sample sent for culture studies. A small amount of diluted contrast material was injected. A guide wire was then advanced into the gallbladder. A transitional dilator was placed. Percutaneous tract dilatation was then performed over a guide wire to 10-French. A 10-French pigtail drainage catheter was then advanced into the gallbladder lumen under fluoroscopy. Catheter was formed and injected with contrast material to confirm position. The catheter was flushed and connected to a gravity drainage bag. It was secured at the skin with a Prolene retention suture and Stat-Lock device. COMPLICATIONS: None FINDINGS: After needle puncture of the gallbladder, a bile sample was aspirated and sent  for culture. Transhepatic approach to cholecystostomy tube placement was chosen through a short segment of the right lobe of the liver as there is colon very close to the gallbladder lumen just under the abdominal wall. The cholecystostomy tube was advanced into the gallbladder lumen and formed. It is now draining bile. This tube will be left to gravity drainage. IMPRESSION: Percutaneous cholecystostomy with placement of 10-French drainage catheter into the gallbladder lumen. This was left to gravity drainage. Electronically Signed   By: Aletta Edouard M.D.   On: 05/10/2018 17:07   Dg Chest Port 1 View  Result Date: 05/17/2018 CLINICAL DATA:  Leukocytosis. EXAM: PORTABLE CHEST 1 VIEW COMPARISON:  Radiographs and CT scan of May 11, 2018. FINDINGS: Stable cardiomediastinal silhouette. Status post coronary artery bypass graft. Left-sided pacemaker is unchanged in position. No pneumothorax is noted. Stable bibasilar subsegmental atelectasis or edema is noted with small pleural effusions, left greater than right. Bony thorax is unremarkable. IMPRESSION: Stable bibasilar subsegmental atelectasis or edema is noted with small associated pleural effusions. Electronically Signed   By: Marijo Conception, M.D.   On: 05/17/2018 07:57   Dg Chest Port 1 View  Result Date: 05/11/2018 CLINICAL DATA:  Acute respiratory failure with hypoxia. Coronary artery disease. Chronic myeloid leukemia. EXAM: PORTABLE CHEST 1 VIEW COMPARISON:  05/09/2018 FINDINGS: Stable cardiomegaly. Transvenous pacemaker remains in appropriate position. Prior CABG again noted. Symmetric bibasilar airspace disease and small bilateral pleural effusions show no significant change. IMPRESSION: No significant change in symmetric bibasilar airspace disease and small bilateral pleural effusions, likely due to congestive heart failure. Electronically Signed  By: Earle Gell M.D.   On: 05/11/2018 08:35   Dg Chest Port 1 View  Result Date:  05/09/2018 CLINICAL DATA:  Respiratory failure EXAM: PORTABLE CHEST 1 VIEW COMPARISON:  04/12/2018 FINDINGS: Cardiac shadow is stable. Postsurgical changes are again seen. Pacing device is again noted and stable. Increased vascular congestion with interstitial edema is noted. Chronic blunting of left costophrenic angle is noted. Left basilar scarring is again noted. IMPRESSION: Changes of congestive heart failure with edema. Chronic changes in the left base appear Electronically Signed   By: Inez Catalina M.D.   On: 05/09/2018 12:11   Dg Swallowing Func-speech Pathology  Result Date: 05/13/2018 Objective Swallowing Evaluation: Type of Study: MBS-Modified Barium Swallow Study  Patient Details Name: Eric [name] MRN: [id number] Date of Birth: [birth year] Today's Date: 05/13/2018 Time: SLP Start Time (ACUTE ONLY): 0900 -SLP Stop Time (ACUTE ONLY): 0925 SLP Time Calculation (min) (ACUTE ONLY): 25 min Past Medical History: Past Medical History: Diagnosis Date . ALLERGIC RHINITIS  . ANEMIA-NOS  . AORTIC SCLEROSIS  . Asthma  . CARDIOMYOPATHY, ISCHEMIC  . CAROTID BRUIT, RIGHT 02/27/2008 . Cataract   surgery . CML (chronic myeloid leukemia) ([place]) 06/26/2015 . COPD  . CORONARY ARTERY DISEASE   a. s/p CABG in 1995 b. DES in 2008, 2009, and most recent in 2012 with DES to SVG-OM . DIABETES MELLITUS-TYPE II   diet controlled . Diastolic dysfunction, Grade 1 11/24/2014 . Diverticulitis of colon with perforation 11/22/2014 . Diverticulosis  . GERD  . HIATAL HERNIA  . Hx of echocardiogram   Echo (9/15):  Mild LVH, EF 50-55%, no RWMA, Gr 1 DD, MAC, mild LAE. [name] HYPERLIPIDEMIA  . HYPERTENSION  . Hyponatremia 11/22/2014 . IBS (irritable bowel syndrome)  . LACTOSE INTOLERANCE  . OA (osteoarthritis)  . OBESITY  . On home oxygen therapy   "3L all the time" (10/12/2016) . Partial small bowel obstruction ([place])  . PERIPHERAL VASCULAR DISEASE  . Primary hyperparathyroidism ([place])   Lab Results Component Value Date  PTH 150.7* 02/13/2013  CALCIUM  11.0* 02/13/2013  CAION 1.21 03/15/2008   . Prostate cancer ([place])   seed implants 2004 . SICK SINUS/ TACHY-BRADY SYNDROME 09/2007  s/p PPM st jud[employer] . Sleep apnea  . SMALL BOWEL OBSTRUCTION 04/18/2009  Qualifier: History of  By: Asa Lente MD, Jannifer Rodney Symptomatic diverticulosis 01/18/2009  Qualifier: Diagnosis of  By: Shane Crutch, Amy S  Past Surgical History: Past Surgical History: Procedure Laterality Date . BACK SURGERY   . Bilateral cataracts   . COLON RESECTION N/A 11/28/2014  Procedure: EXPLORATORY LAPAROTOMY, SIGMOID COLECTOMY WITH COLOSTOMY;  Surgeon: Jackolyn Confer, MD;  Location: [facility];  Service: General;  Laterality: N/A; . COLON SURGERY   . COLONOSCOPY   . CORONARY ARTERY BYPASS GRAFT   . ESOPHAGOGASTRODUODENOSCOPY  multiple . FLEXIBLE SIGMOIDOSCOPY N/A 09/22/2013  Procedure: FLEXIBLE SIGMOIDOSCOPY;  Surgeon: Gatha Mayer, MD;  Location: [facility];  Service: Endoscopy;  Laterality: N/A; . INGUINAL HERNIA REPAIR Bilateral  . IR PERC CHOLECYSTOSTOMY  05/10/2018 . [place] SURGERY  12/2008 . PACEMAKER INSERTION    DDD/[employer]         Last interrogation 2/13  on chart     Pacemaker guideline order Dr Tamala Julian on chart . Partial small bowel obstruction  2009 . PENILE PROSTHESIS PLACEMENT   . PTCA  2008, 2009, 2012  with DES . REMOVAL OF PENILE PROSTHESIS N/A 04/22/2017  Procedure: EXPLANT OF MULTICOMPONENT PENILE PROSTHESIS;  Surgeon: Irine Seal, MD;  Location: [facility];  Service: Urology;  Laterality: N/A; . TOTAL HIP ARTHROPLASTY  08/21/2011  Procedure: TOTAL HIP ARTHROPLASTY;  Surgeon: Johnn Hai, MD;  Location: [facility];  Service: Orthopedics;  Laterality: Right; HPI: CLAXTON LEVITZ is a 84 y.o. male with medical history significant for coronary artery disease and aortic sclerosis hypertension hyperlipidemia, diabetes, COPD, O2 dependent on 3 L at home, chronic respiratory failure on oxygen, ischemic cardiomyopathy, atrial fibrillation on Eliquis. Patient presented with right upper quadrant  pain, dx with acute cholecystitis s/p cholecystectomy 12/4. Also found to have evidence of bilateral pneumonia and positive blood cultures for E. Coli.  Subjective: pt awake in bed Assessment / Plan / Recommendation [facility] IP CLINICAL IMPRESSIONS 05/13/2018 Clinical Impression Pt presents with overall functional oropharyngeal swallow function with no aspiration or penetration.  Slight decreased organization in oral transiting and piecemealing noted.  In addition, mild pharyngeal residuals of liquids noted were decreased/largely cleared with dry swallows *reflexive.  Pt does appear with residuals in esophagus with retrograde propulsion - ? dysmotility.  Barium tablet given with pudding cleared pharynx easily but then lodged in esophagus and did clear into stomach after liquids - appeared to lodge at [place].  Pt admits he has problems with sensing liquids -water- coming back up.  Suspect esophageal dysphagia is largest aspiration risk due to backflow.  Note GOC meeting to take place and suspect waiting until after meeting to determine indication for esophageal workup.  Using live video, pt educated to findings/recommendations.  Re oropharyngeal swallow, he is appropriate for a dys3 (pt edentulous) thin diet.  Will follow up for pt/family education.  Thanks.   SLP Visit Diagnosis Dysphagia, unspecified (R13.10) Attention and concentration deficit following -- Frontal lobe and executive function deficit following -- Impact on safety and function Moderate aspiration risk   CHL IP TREATMENT RECOMMENDATION 05/13/2018 Treatment Recommendations Therapy as outlined in treatment plan below   Prognosis 05/13/2018 Prognosis for Safe Diet Advancement Fair Barriers to Reach Goals Time post onset Barriers/Prognosis Comment -- CHL IP DIET RECOMMENDATION 05/13/2018 SLP Diet Recommendations Dysphagia 3 (Mech soft) solids;Thin liquid Liquid Administration via Cup Medication Administration Crushed with puree Compensations Slow rate;Small  sips/bites;Follow solids with liquid;Other (Comment) Postural Changes Remain semi-upright after after feeds/meals (Comment);Seated upright at 90 degrees   CHL IP OTHER RECOMMENDATIONS 05/13/2018 Recommended Consults Consider esophageal assessment Oral Care Recommendations Oral care BID Other Recommendations --   CHL IP FOLLOW UP RECOMMENDATIONS 05/12/2018 Follow up Recommendations (No Data)   CHL IP FREQUENCY AND DURATION 05/13/2018 Speech Therapy Frequency (ACUTE ONLY) min 1 x/week Treatment Duration 1 week      CHL IP ORAL PHASE 05/13/2018 Oral Phase WFL Oral - Pudding Teaspoon -- Oral - Pudding Cup -- Oral - Honey Teaspoon -- Oral - Honey Cup -- Oral - Nectar Teaspoon -- Oral - Nectar Cup -- Oral - Nectar Straw -- Oral - Thin Teaspoon -- Oral - Thin Cup -- Oral - Thin Straw -- Oral - Puree -- Oral - Mech Soft -- Oral - Regular -- Oral - Multi-Consistency -- Oral - Pill -- Oral Phase - Comment piecemealing noted which was functional for pt  CHL IP PHARYNGEAL PHASE 05/13/2018 Pharyngeal Phase WFL Pharyngeal- Pudding Teaspoon -- Pharyngeal -- Pharyngeal- Pudding Cup -- Pharyngeal -- Pharyngeal- Honey Teaspoon -- Pharyngeal -- Pharyngeal- Honey Cup -- Pharyngeal -- Pharyngeal- Nectar Teaspoon -- Pharyngeal -- Pharyngeal- Nectar Cup -- Pharyngeal -- Pharyngeal- Nectar Straw -- Pharyngeal -- Pharyngeal- Thin Teaspoon -- Pharyngeal -- Pharyngeal- Thin Cup --  Pharyngeal -- Pharyngeal- Thin Straw -- Pharyngeal -- Pharyngeal- Puree -- Pharyngeal -- Pharyngeal- Mechanical Soft -- Pharyngeal -- Pharyngeal- Regular -- Pharyngeal -- Pharyngeal- Multi-consistency -- Pharyngeal -- Pharyngeal- Pill -- Pharyngeal -- Pharyngeal Comment mild residuals in pharynx largely cleared with dry swallows *reflexive, pt with decreased sustained laryngeal closure with thin swallows but this did not result in laryngeal penetration or aspiration  CHL IP CERVICAL ESOPHAGEAL PHASE 05/13/2018 Cervical Esophageal Phase Impaired Pudding Teaspoon -- Pudding  Cup -- Honey Teaspoon -- Honey Cup -- Nectar Teaspoon -- Nectar Cup -- Nectar Straw -- Thin Teaspoon -- Thin Cup -- Thin Straw -- Puree -- Mechanical Soft -- Regular -- Multi-consistency -- Pill -- Cervical Esophageal Comment appearance of minimal residuals mixed with secretions at pyriform sinus Eric [name] 05/13/2018, 9:45 AM  Luanna Salk, MS [facility] SLP Acute Rehab Services Pager [phone] Office [phone]              DISCHARGE EXAMINATION: Vitals:   05/18/18 2114 05/19/18 0500 05/19/18 0604 05/19/18 0846  BP:   115/77   Pulse:   83   Resp:   16   Temp:   98.4 F (36.9 C)   TempSrc:   Oral   SpO2: 99%  98% 98%  Weight:  100.9 kg    Height:       General appearance: alert, cooperative, appears stated age and no distress Resp: Coarse breath sounds bilaterally.  Few crackles at the bases.  No wheezing or rhonchi. Cardio: S1-S2 is irregularly irregular. GI: soft, non-tender; bowel sounds normal; no masses,  no organomegaly  DISPOSITION: SNF  Discharge Instructions    Call MD for:  difficulty breathing, headache or visual disturbances   Complete by:  As directed    Call MD for:  extreme fatigue   Complete by:  As directed    Call MD for:  persistant dizziness or light-headedness   Complete by:  As directed    Call MD for:  persistant nausea and vomiting   Complete by:  As directed    Call MD for:  severe uncontrolled pain   Complete by:  As directed    Call MD for:  temperature >100.4   Complete by:  As directed    Discharge instructions   Complete by:  As directed    Please review instructions on the discharge summary  You were cared for by a hospitalist during your hospital stay. If you have any questions about your discharge medications or the care you received while you were in the hospital after you are discharged, you can call the unit and asked to speak with the hospitalist on call if the hospitalist that took care of you is not available. Once you are  discharged, your primary care physician will handle any further medical issues. Please note that NO REFILLS for any discharge medications will be authorized once you are discharged, as it is imperative that you return to your primary care physician (or establish a relationship with a primary care physician if you do not have one) for your aftercare needs so that they can reassess your need for medications and monitor your lab values. If you do not have a primary care physician, you can call [phone] for a physician referral.   Increase activity slowly   Complete by:  As directed         Allergies as of 05/19/2018      Reactions   Actos [pioglitazone Hydrochloride] Other (See Comments)   "felt  funny, drowsy, and weak":   Buprenorphine Hcl Nausea And Vomiting   Celebrex [celecoxib] Other (See Comments)   "felt funny"   Demerol Palpitations, Other (See Comments)   Increased BP   Meperidine Palpitations   Other reaction(s): Other (See Comments) Increased BP   Morphine And Related Nausea And Vomiting   Ciprofloxacin Other (See Comments)   arthralgia   Metformin Nausea And Vomiting   Zocor [simvastatin] Other (See Comments)   Makes pt very drowsy      Medication List    TAKE these medications   acetaminophen 500 MG tablet Commonly known as:  TYLENOL Take 1,000 mg by mouth every 6 (six) hours as needed for mild pain or fever.   amiodarone 200 MG tablet Commonly known as:  PACERONE Take 1 tablet (200 mg total) by mouth 2 (two) times daily.   aspirin 81 MG EC tablet Take 1 tablet (81 mg total) by mouth daily.   budesonide 0.25 MG/2ML nebulizer solution Commonly known as:  PULMICORT Take 2 mLs (0.25 mg total) by nebulization 2 (two) times daily.   cefpodoxime 200 MG tablet Commonly known as:  VANTIN Take 1 tablet (200 mg total) by mouth 2 (two) times daily for 5 days.   diltiazem 180 MG 24 hr capsule Commonly known as:  CARDIZEM CD Take 1 capsule (180 mg total) by mouth  daily.   ELIQUIS 5 MG Tabs tablet Generic drug:  apixaban Take 2.5 mg by mouth 2 (two) times daily. What changed:  Another medication with the same name was removed. Continue taking this medication, and follow the directions you see here.   ferrous sulfate 325 (65 FE) MG tablet Take 1 tablet (325 mg total) by mouth daily with breakfast.   fluticasone 50 MCG/ACT nasal spray Commonly known as:  FLONASE Place 2 sprays into both nostrils daily. Allergies   furosemide 40 MG tablet Commonly known as:  LASIX Take 1 tablet (40 mg total) by mouth 2 (two) times daily.   glucose blood test strip Commonly known as:  ONE TOUCH ULTRA TEST USE TO CHECK BLOOD SUGARS ONCE A DAY   guaiFENesin 600 MG 12 hr tablet Commonly known as:  MUCINEX Take 2 tablets (1,200 mg total) by mouth 2 (two) times daily.   guaiFENesin-dextromethorphan 100-10 MG/5ML syrup Commonly known as:  ROBITUSSIN DM Take 5 mLs by mouth every 4 (four) hours as needed for cough.   HYDROcodone-acetaminophen 7.5-325 MG tablet Commonly known as:  NORCO Take 1 tablet by mouth every 6 (six) hours as needed for moderate pain or severe pain.   insulin aspart 100 UNIT/ML injection Commonly known as:  novoLOG Inject 0-5 Units into the skin at bedtime. Insulin sliding scale What changed:    how much to take  when to take this  additional instructions   ipratropium-albuterol 0.5-2.5 (3) MG/3ML Soln Commonly known as:  DUONEB Take 3 mLs by nebulization 3 (three) times daily.   isosorbide mononitrate 30 MG 24 hr tablet Commonly known as:  IMDUR Take 0.5 tablets (15 mg total) by mouth daily.   pravastatin 20 MG tablet Commonly known as:  PRAVACHOL Take 20 mg by mouth daily.   ranitidine 300 MG tablet Commonly known as:  ZANTAC Take 600 mg by mouth at bedtime.   senna 8.6 MG tablet Commonly known as:  SENOKOT Take 2 tablets by mouth daily.   sertraline 25 MG tablet Commonly known as:  ZOLOFT Take 1 tablet (25 mg  total) by mouth daily. Start taking on:  May 20, 2018        Follow-up Information    Excell Seltzer, MD Follow up.   Specialty:  General Surgery Why:  [facility] for follow up about 4 weeks after discharge. Contact information: [address] [place] [address] [phone]           TOTAL DISCHARGE TIME: 35 minutes  Bonnielee Haff  [facility] Pager [phone]  05/19/2018, 11:56 AM

## 2018-05-20 ENCOUNTER — Telehealth: Payer: Self-pay | Admitting: *Deleted

## 2018-05-20 DIAGNOSIS — G4733 Obstructive sleep apnea (adult) (pediatric): Secondary | ICD-10-CM | POA: Diagnosis not present

## 2018-05-20 DIAGNOSIS — I5042 Chronic combined systolic (congestive) and diastolic (congestive) heart failure: Secondary | ICD-10-CM | POA: Diagnosis not present

## 2018-05-20 DIAGNOSIS — N183 Chronic kidney disease, stage 3 (moderate): Secondary | ICD-10-CM | POA: Diagnosis not present

## 2018-05-20 DIAGNOSIS — A419 Sepsis, unspecified organism: Secondary | ICD-10-CM | POA: Diagnosis not present

## 2018-05-20 DIAGNOSIS — I509 Heart failure, unspecified: Secondary | ICD-10-CM | POA: Diagnosis not present

## 2018-05-20 DIAGNOSIS — I4891 Unspecified atrial fibrillation: Secondary | ICD-10-CM | POA: Diagnosis not present

## 2018-05-20 DIAGNOSIS — J449 Chronic obstructive pulmonary disease, unspecified: Secondary | ICD-10-CM | POA: Diagnosis not present

## 2018-05-20 DIAGNOSIS — K810 Acute cholecystitis: Secondary | ICD-10-CM | POA: Diagnosis not present

## 2018-05-20 DIAGNOSIS — I2510 Atherosclerotic heart disease of native coronary artery without angina pectoris: Secondary | ICD-10-CM | POA: Diagnosis not present

## 2018-05-20 DIAGNOSIS — C9290 Myeloid leukemia, unspecified, not having achieved remission: Secondary | ICD-10-CM | POA: Diagnosis not present

## 2018-05-20 DIAGNOSIS — A4151 Sepsis due to Escherichia coli [E. coli]: Secondary | ICD-10-CM | POA: Diagnosis not present

## 2018-05-20 DIAGNOSIS — J9611 Chronic respiratory failure with hypoxia: Secondary | ICD-10-CM | POA: Diagnosis not present

## 2018-05-20 DIAGNOSIS — E1122 Type 2 diabetes mellitus with diabetic chronic kidney disease: Secondary | ICD-10-CM | POA: Diagnosis not present

## 2018-05-20 NOTE — Telephone Encounter (Signed)
Pt was on TCM report admitted 05/08/18 c/o right upper quadrant pain concerning for acute cholecystitis, but also found to have evidence of bilateral pneumonia and positive blood cultures for E. Coli. Patient was seen by general surgery, and was recommended percutaneous drain placement by interventional radiology. Pt D/C 05/19/18 back to SNF. Per summary will need to follow-up w/PCP after leaving skill nursing.Marland KitchenJohny Chess

## 2018-05-23 DIAGNOSIS — M25562 Pain in left knee: Secondary | ICD-10-CM | POA: Diagnosis not present

## 2018-05-23 DIAGNOSIS — I5042 Chronic combined systolic (congestive) and diastolic (congestive) heart failure: Secondary | ICD-10-CM | POA: Diagnosis not present

## 2018-05-23 DIAGNOSIS — A4151 Sepsis due to Escherichia coli [E. coli]: Secondary | ICD-10-CM | POA: Diagnosis not present

## 2018-05-23 DIAGNOSIS — K810 Acute cholecystitis: Secondary | ICD-10-CM | POA: Diagnosis not present

## 2018-05-26 ENCOUNTER — Other Ambulatory Visit: Payer: Self-pay | Admitting: *Deleted

## 2018-05-26 ENCOUNTER — Telehealth: Payer: Self-pay | Admitting: Internal Medicine

## 2018-05-26 NOTE — Telephone Encounter (Signed)
6pm TC to daughter Eric [name] [phone]. I called to let Eric [name] know that I had received a Palliative Care consult for her dad, and to gain her permission to see patient. I explained the scope o our services (focus on advanced care planning and goals of care). Eric [name] said that it would be fine for me to consult. Eric [name] reports that she and her two sisters have been estranged from her dad for the last year, but all three of them got together and visited this past Sunday Dec 8. She stated the visit went fairly well, without major blowups. She mentioned that her dad requested, at this last visit, that she be the one to be the first emergency contact. It is her opinion that  her dad is competent of making decisions on his own behalf. She warned me that he is HOH. I asked if she would agree to to be his HCPOA should he request this, and she said this would be fine. I let her know that should her dad be unable to make decisions for himself, and if he had not designated a HCPOA, decisions regarding her dad's care would fall among all 3 of the sisters.  I will plan to see the patient 12/20 or early next week, and call Eric [name] in follow up. Eric [name] reported that her dad had a stent placed to drain his gallbladder, and that he was not deemed to be a surgical candidate.   Violeta Gelinas NP-C [phone]

## 2018-05-26 NOTE — Patient Outreach (Signed)
[name]) Care Management  05/26/2018  Eric [name] [birth year] [id number]   Confirmed with [facility] UM that patient is a LTC resident of facility. No discharge to home planned. No [facility] care management needs identified at this time.  Will sign off. Royetta Crochet. [name], RN, BSN, [place] [phone]) Business Cell  [phone]) Toll Free Office

## 2018-05-27 ENCOUNTER — Non-Acute Institutional Stay: Payer: Medicare Other | Admitting: Internal Medicine

## 2018-05-27 ENCOUNTER — Encounter: Payer: Self-pay | Admitting: Internal Medicine

## 2018-05-27 VITALS — BP 122/56 | HR 84 | Resp 20 | Ht 66.0 in | Wt 210.0 lb

## 2018-05-27 DIAGNOSIS — R634 Abnormal weight loss: Secondary | ICD-10-CM

## 2018-05-27 DIAGNOSIS — F4321 Adjustment disorder with depressed mood: Secondary | ICD-10-CM | POA: Diagnosis not present

## 2018-05-27 DIAGNOSIS — R531 Weakness: Secondary | ICD-10-CM | POA: Diagnosis not present

## 2018-05-27 DIAGNOSIS — Z7189 Other specified counseling: Secondary | ICD-10-CM

## 2018-05-27 NOTE — Progress Notes (Signed)
Community Palliative Care Telephone: [phone] Fax: [phone]  PATIENT NAME: Eric [name] DOB: 04/18/1927 MRN: [id number]  Ritta Slot RM [place]  PRIMARY CARE PROVIDER:   [name], MD  REFERRING PROVIDER: Dr. Lysle Rubens RESPONSIBLE PARTY: Edwards, Mckelvie (Daughter) [phone]. Madden,Toni (Daughter) (H) [phone], [phone], (M) [phone], Newkirk,Brenda (Daughter) [phone], [phone]  HISTORY OF PRESENT ILLNESS:  Eric [name] is a 84 y.o.male with  medical h/o chronic respiratory failure (chronic O2 3L), chronic systolic/diastolic CHF (ischemic cardiomyopathy; ECHO 04/2018: EF 445-50%), CAD, aortic sclerosis,  COPD, OSA (CPAP), CKD (stage III; labs 05/20/18 BUN 15, Creatinine 1.23, GFR 52), chronic atrial fibrillation (Eliquis), anemia (microcytic), HTN, DM2 (diet controlled with s/s insulin), and morbid obesity. He is s/p hospital admission 12/01-12/12/19 for sepsis, pneumonia (RML and bilateral lower lobes), and acute cholecystitis (drain placed; f/u OP surgical consult for possible cholecystectomy pending rehab progress).  Palliative Care was asked to help address goals of care.      IMPRESSION/RECOMMENDATIONS:     !. Weakness and deconditioning r/t underlying medical illnesses and recent hospitalizations. Patient's PPS is 30%. He is dependent for hygiene, dressing, and transfers. He is incontinent of bowel and bladder. He is a two person heavy transfer and needs cuing to pivot. He becomes very fatigued when sitting up in the wheelchair, and he finds it exhausting to sit up longer than 4 hours. He continues to work with facility physical therapist. Patient is non-ambulatory.   2. Weight loss: Current weight is 210lbs, down 19 lbs over this last month. At height of 5'6" his BMI: 33.9 kg/m2.  He is on a dysphagia 3 diet with thin liquids. Patient reports poor appetite. Staff report oral intake of 25-50% of meals, and he sometimes skips a meal. He eats his  meals in his room. His CBGs range 115-254.  3. Depressed mood; adjustment to chronic medical illness. During recent hospitalization his Remeron was discontinued  he was started on Zoloft. His affect is depressed. He mentions he is "tired of being sick all the time". Wishes he could just go to sleep and die. We discussed his daughters recent visit to the hospital 5 days earlier (they have been estranged from him for about a year) and patient thinks the visit went fairly well. He mentioned that he apoloigized to his daughters for "being so mean to them". We discussed what past coping stratigies he has used in the past to help him through tough times, and he drew a blank. -I wrote his  daughters phone numbers down for him and left them at his bedside. I encourage him to reach out to them. -Consider upward titration of Zoloft.  -Ongoing counseling regarding setting achievable goals, and cultivating a sense of hope and gratitude towards elevating his mood.  4. Goals of Care:  -patient hoping to get his laundry done. I will call his daughter Mechele Claude and see if she can help arrange this. Facility staff mention that there is a Proofreader available but patient's clothing need to be labeled with his name. -Patient's flip phone is out of service. Will resource his daughter to see if she might consider activation of his service.  5. Pain in left wrist and left ankle: Has pen Narco 7.5-325mg. (averaging 1 dose/day)  6. Small sacral pressure injury reported by staff.  7. Advance Care Planning / Goals of Care: DNR / MOST form on chart.  8.F/U NP visit: in 2-4 weeks; supportive counseling, clarifying goals of care. I will touch base with facility SW.  I spent 85 minutes providing this consultation,  from 11:30am to 12:55pm. More than 50% of the time in this consultation was spent coordinating communication.   CODE STATUS: DNR, MOST details: DNR/DNI, Limited additional scope of medical interventions, Yes to  antibiotics, yes to IVFs.   PPS: 30% HOSPICE ELIGIBILITY/DIAGNOSIS: TBD  PAST MEDICAL HISTORY:  Past Medical History:  Diagnosis Date  . ALLERGIC RHINITIS   . ANEMIA-NOS   . AORTIC SCLEROSIS   . Asthma   . CARDIOMYOPATHY, ISCHEMIC   . CAROTID BRUIT, RIGHT 02/27/2008  . Cataract    surgery  . CML (chronic myeloid leukemia) ([place]) 06/26/2015  . COPD   . CORONARY ARTERY DISEASE    a. s/p CABG in 1995 b. DES in 2008, 2009, and most recent in 2012 with DES to SVG-OM  . DIABETES MELLITUS-TYPE II    diet controlled  . Diastolic dysfunction, Grade 1 11/24/2014  . Diverticulitis of colon with perforation 11/22/2014  . Diverticulosis   . GERD   . HIATAL HERNIA   . Hx of echocardiogram    Echo (9/15):  Mild LVH, EF 50-55%, no RWMA, Gr 1 DD, MAC, mild LAE.  [name] HYPERLIPIDEMIA   . HYPERTENSION   . Hyponatremia 11/22/2014  . IBS (irritable bowel syndrome)   . LACTOSE INTOLERANCE   . OA (osteoarthritis)   . OBESITY   . On home oxygen therapy    "3L all the time" (10/12/2016)  . Partial small bowel obstruction ([place])   . PERIPHERAL VASCULAR DISEASE   . Primary hyperparathyroidism ([place])    Lab Results Component Value Date  PTH 150.7* 02/13/2013  CALCIUM 11.0* 02/13/2013  CAION 1.21 03/15/2008    . Prostate cancer ([place])    seed implants 2004  . SICK SINUS/ TACHY-BRADY SYNDROME 09/2007   s/p PPM st judes  . Sleep apnea   . SMALL BOWEL OBSTRUCTION 04/18/2009   Qualifier: History of  By: Asa Lente MD, Jannifer Rodney Symptomatic diverticulosis 01/18/2009   Qualifier: Diagnosis of  By: Shane Crutch, Amy S     SOCIAL HX:  Social History   Tobacco Use  . Smoking status: Former Smoker    Packs/day: 1.00    Years: 25.00    Pack years: 25.00    Types: Cigarettes    Last attempt to quit: 06/08/1994    Years since quitting: 23.9  . Smokeless tobacco: Never Used  Substance Use Topics  . Alcohol use: No    Alcohol/week: 0.0 standard drinks    ALLERGIES:  Allergies  Allergen Reactions  . Actos  [Pioglitazone Hydrochloride] Other (See Comments)    "felt funny, drowsy, and weak":  [name] Buprenorphine Hcl Nausea And Vomiting  . Celebrex [Celecoxib] Other (See Comments)    "felt funny"  . Demerol Palpitations and Other (See Comments)    Increased BP  . Meperidine Palpitations    Other reaction(s): Other (See Comments) Increased BP  . Morphine And Related Nausea And Vomiting  . Ciprofloxacin Other (See Comments)    arthralgia  . Metformin Nausea And Vomiting  . Zocor [Simvastatin] Other (See Comments)    Makes pt very drowsy     PERTINENT MEDICATIONS:  Outpatient Encounter Medications as of 05/27/2018  Medication Sig  . acetaminophen (TYLENOL) 500 MG tablet Take 1,000 mg by mouth every 6 (six) hours as needed for mild pain or fever.  [name] amiodarone (PACERONE) 200 MG tablet Take 1 tablet (200 mg total) by mouth 2 (two) times daily.  [name] apixaban (ELIQUIS)  5 MG TABS tablet Take 2.5 mg by mouth 2 (two) times daily.  [name] aspirin EC 81 MG EC tablet Take 1 tablet (81 mg total) by mouth daily.  . budesonide (PULMICORT) 0.25 MG/2ML nebulizer solution Take 2 mLs (0.25 mg total) by nebulization 2 (two) times daily.  [name] diltiazem (CARDIZEM CD) 180 MG 24 hr capsule Take 1 capsule (180 mg total) by mouth daily.  . ferrous sulfate 325 (65 FE) MG tablet Take 1 tablet (325 mg total) by mouth daily with breakfast.  . fluticasone (FLONASE) 50 MCG/ACT nasal spray Place 2 sprays into both nostrils daily. Allergies  . furosemide (LASIX) 40 MG tablet Take 1 tablet (40 mg total) by mouth 2 (two) times daily.  [name] glucose blood (ONE TOUCH ULTRA TEST) test strip USE TO CHECK BLOOD SUGARS ONCE A DAY  . guaiFENesin (MUCINEX) 600 MG 12 hr tablet Take 2 tablets (1,200 mg total) by mouth 2 (two) times daily.  [name] guaiFENesin-dextromethorphan (ROBITUSSIN DM) 100-10 MG/5ML syrup Take 5 mLs by mouth every 4 (four) hours as needed for cough.  [name] HYDROcodone-acetaminophen (NORCO) 7.5-325 MG tablet Take 1 tablet by mouth every 6  (six) hours as needed for moderate pain or severe pain.  [name] insulin aspart (NOVOLOG) 100 UNIT/ML injection Inject 0-5 Units into the skin at bedtime. Insulin sliding scale (Patient taking differently: Inject 0-9 Units into the skin 3 (three) times daily with meals. Insulin sliding scale  101-150=1 units 151-200=2 units 201-250=3 units 251-300=5 units 301-350=7 units >350=9 units Call MD for BS>400 or <60)  . ipratropium-albuterol (DUONEB) 0.5-2.5 (3) MG/3ML SOLN Take 3 mLs by nebulization 3 (three) times daily.   . isosorbide mononitrate (IMDUR) 30 MG 24 hr tablet Take 0.5 tablets (15 mg total) by mouth daily.  . pravastatin (PRAVACHOL) 20 MG tablet Take 20 mg by mouth daily.   . ranitidine (ZANTAC) 300 MG tablet Take 600 mg by mouth at bedtime.   . senna (SENOKOT) 8.6 MG tablet Take 2 tablets by mouth daily.   . sertraline (ZOLOFT) 25 MG tablet Take 1 tablet (25 mg total) by mouth daily.   No facility-administered encounter medications on file as of 05/27/2018.     PHYSICAL EXAM:  Frail appearing elderly male with sad and flat affect, sitting up in wheelchair. He appears very fatigued. Cardiovascular: regular rate and rhythm without MRG Pulmonary: clear ant fields Abdomen: soft, nontender, + bowel sounds. Large left lower abdominal hernia. Percutanous gallbladder drainage tube in place draining well Extremities: mild LE edema, no joint deformities.Left UE swelling c/w right. Patient states is chronic. Skin: no rashes Neurological: Marked weakness but otherwise nonfocal  Julianne Handler, NP

## 2018-05-31 DIAGNOSIS — I509 Heart failure, unspecified: Secondary | ICD-10-CM | POA: Diagnosis not present

## 2018-05-31 DIAGNOSIS — I4891 Unspecified atrial fibrillation: Secondary | ICD-10-CM | POA: Diagnosis not present

## 2018-05-31 DIAGNOSIS — E46 Unspecified protein-calorie malnutrition: Secondary | ICD-10-CM | POA: Diagnosis not present

## 2018-05-31 DIAGNOSIS — D649 Anemia, unspecified: Secondary | ICD-10-CM | POA: Diagnosis not present

## 2018-05-31 DIAGNOSIS — F0390 Unspecified dementia without behavioral disturbance: Secondary | ICD-10-CM | POA: Diagnosis not present

## 2018-05-31 DIAGNOSIS — N183 Chronic kidney disease, stage 3 (moderate): Secondary | ICD-10-CM | POA: Diagnosis not present

## 2018-05-31 DIAGNOSIS — J449 Chronic obstructive pulmonary disease, unspecified: Secondary | ICD-10-CM | POA: Diagnosis not present

## 2018-05-31 DIAGNOSIS — R1310 Dysphagia, unspecified: Secondary | ICD-10-CM | POA: Diagnosis not present

## 2018-05-31 DIAGNOSIS — K810 Acute cholecystitis: Secondary | ICD-10-CM | POA: Diagnosis not present

## 2018-05-31 DIAGNOSIS — F411 Generalized anxiety disorder: Secondary | ICD-10-CM | POA: Diagnosis not present

## 2018-06-02 ENCOUNTER — Other Ambulatory Visit (HOSPITAL_COMMUNITY): Payer: Self-pay | Admitting: Radiology

## 2018-06-02 ENCOUNTER — Non-Acute Institutional Stay: Payer: Medicare Other | Admitting: Licensed Clinical Social Worker

## 2018-06-02 DIAGNOSIS — Z515 Encounter for palliative care: Secondary | ICD-10-CM

## 2018-06-02 DIAGNOSIS — K810 Acute cholecystitis: Secondary | ICD-10-CM

## 2018-06-03 NOTE — Progress Notes (Signed)
COMMUNITY PALLIATIVE CARE SW NOTE  PATIENT NAME: Eric [name] DOB: 1926/09/01 MRN: [id number]  PRIMARY CARE PROVIDER: [name], MD  RESPONSIBLE PARTY:  Acct ID - Guarantor Home Phone Work Phone Relationship Acct Type  [id number] - Norrod,[phone]  Self P/F     [address], [place], [place] [address]      PLAN OF CARE and INTERVENTIONS:             1. GOALS OF CARE/ ADVANCE CARE PLANNING:  Patient stated he wishes to feel better since being hospitalized.  He has a DNR. 2. SOCIAL/EMOTIONAL/SPIRITUAL ASSESSMENT/ INTERVENTIONS:  SW met with patient at [facility] SNF.  He returned to the facility after being hospitalized.  He was lying in bed and said he was in the hospital because he was vomiting with blood in it.  Patient was minimally responsive and appeared to fatigue easily.  He denied pain.  SW consulted facility SW, [name], who reported patient will most likely remain at the facility.  3. ATIENT/CAREGIVER EDUCATION/ COPING:  Patient copes by expressing his feelings. 4. PERSONAL EMERGENCY PLAN:  Per facility protocol. 5. COMMUNITY RESOURCES COORDINATION/ HEALTH CARE NAVIGATION:  None. FINANCIAL/LEGAL CONCERNS/INTERVENTIONS:  Patient is on a fixed income.   SOCIAL HX:  Social History   Tobacco Use  . Smoking status: Former Smoker    Packs/day: 1.00    Years: 25.00    Pack years: 25.00    Types: Cigarettes    Last attempt to quit: 06/08/1994    Years since quitting: 24.0  . Smokeless tobacco: Never Used  Substance Use Topics  . Alcohol use: No    Alcohol/week: 0.0 standard drinks     CODE STATUS:  DNR  ADVANCED DIRECTIVES: N MOST FORM COMPLETE:  Y HOSPICE EDUCATION PROVIDED:  N PPS:  Patient reports a decreased appetite.  He is currently bed bound.   Duration of visit and documentation:  30 minutes.   Creola Corn Cezar Misiaszek, LCSW

## 2018-06-09 ENCOUNTER — Telehealth: Payer: Self-pay | Admitting: Internal Medicine

## 2018-06-09 NOTE — Telephone Encounter (Signed)
5:20pm  Returned TC from patient's daughter Maurico Perrell [phone], who was answering my previous message to f/u on my visit to her dad on 05/27/18. We discussed that patient's flip phone was not working. [name] wasn't aware of that. She said that this was something her dad usually took care of and she wasn't sure what service he used. [name] intends to follow up on this. We also discussed the laundry situation, and the logistics of getting his laundry done. [name] said she would follow up on this as well. [name] said that patient told her that he would be at [facility] long term. She wishes to discuss this to confirm if this is so, with [facility] SW. I will pass this request on to [employer].  Violeta Gelinas NP-C [phone]

## 2018-06-10 DIAGNOSIS — J449 Chronic obstructive pulmonary disease, unspecified: Secondary | ICD-10-CM | POA: Diagnosis not present

## 2018-06-10 DIAGNOSIS — K810 Acute cholecystitis: Secondary | ICD-10-CM | POA: Diagnosis not present

## 2018-06-10 DIAGNOSIS — A4151 Sepsis due to Escherichia coli [E. coli]: Secondary | ICD-10-CM | POA: Diagnosis not present

## 2018-06-10 DIAGNOSIS — I5042 Chronic combined systolic (congestive) and diastolic (congestive) heart failure: Secondary | ICD-10-CM | POA: Diagnosis not present

## 2018-06-14 ENCOUNTER — Encounter: Payer: Self-pay | Admitting: Internal Medicine

## 2018-06-14 ENCOUNTER — Non-Acute Institutional Stay: Payer: Medicare Other | Admitting: Internal Medicine

## 2018-06-14 VITALS — BP 120/60 | HR 64 | Resp 18 | Ht 66.0 in | Wt 204.8 lb

## 2018-06-14 DIAGNOSIS — Z515 Encounter for palliative care: Secondary | ICD-10-CM | POA: Diagnosis not present

## 2018-06-14 NOTE — Progress Notes (Signed)
[facility] Telephone: [phone] Fax: [phone]  PATIENT NAME: Eric [name] DOB: [birth year] MRN: [id number]  Ritta Slot RM [place]  PRIMARY CARE PROVIDER:   [name], MD  REFERRING PROVIDER: Dr. Lysle Rubens RESPONSIBLE PARTY: Burl, Tauzin (Daughter) [phone]. Madden,Toni (Daughter) (H) [phone], [phone], (M) [phone], Newkirk,Brenda (Daughter) [phone], [phone]  HISTORY OF PRESENT ILLNESS:  Eric [name] is a 85 y.o.male with  medical h/o chronic respiratory failure (chronic O2 3L), chronic systolic/diastolic CHF (ischemic cardiomyopathy; ECHO 04/2018: EF 445-50%), CAD, aortic sclerosis,  COPD, OSA (CPAP), CKD (stage III; labs 05/20/18 BUN 15, Creatinine 1.23, GFR 52), chronic atrial fibrillation (Eliquis), anemia (microcytic), HTN, DM2 (diet controlled with s/s insulin), and morbid obesity. He is s/p hospital admission 12/01-12/12/19 for sepsis, pneumonia (RML and bilateral lower lobes), and acute cholecystitis (drain placed; f/u OP surgical consult for possible cholecystectomy pending rehab progress).  This is a routine Palliative Care follow up from 05/27/2018.    IMPRESSION/RECOMMENDATIONS:      1. Weakness and deconditioning r/t underlying medical illnesses and decreased oral intake. Patient's PPS is 30%. He is dependent for hygiene, dressing, and transfers (heavy 2 person transfer; needs cueing).He is non-ambulatory.  He is incontinent of bladder; has colostomy bag and is without stool PR.  He becomes very fatigued when sitting up in the wheelchair, and he finds it exhausting to sit up longer than a few hours. PT/OT was recently discontinued d/t non-progression.He continues to work with facility physical therapist. Nausea since last night, better this morning after antiemetics. Colostomy bag gallbladder drain functioning well. Mentions gets nauseous every night usually improved after Zofran. States unusual for nausea to be  this persistent. His current weight is 204.8, down 5.2lbs over this last month, and 24.2lbs over the last 2 months.  At height of 5'6" his BMI: 33.1 kg/m2.  He is on a dysphagia 3 diet with thin liquids. He shows signs of aspiration when drinking liquids, with a harsh cough and occasional emesis.  Patient reports poor appetite. Staff report oral intake of 25-50% of meals, and he skipping more meals.   3. Depressed mood; adjustment to chronic medical illness. Patient has a depressed affect but is able to talk about his feelings. He is estranged from his daughters, and sees little possibility of reconciliation. I have been in past contact with his daughter [name] and she mentions that she will try to do few things for her dad, such as arranging laundry pick up and helping with patient's phone service. Patient feels he has no close friendships, or people who care about him. He mentioned one enjoyment he has is watching the Patriots play football. He is on Xanac 0.25 prn, and Zoloft 4m qd.  -consider upward titration Zoloft.  4. Goals of Care. Patient is a DNR; MOST form on chart. Patient mentions that he doesn't wish any further hospitalizations. We discussed hospice referral for protein calorie malnutrition, and he is agreeable to referral. Dr. HLysle Rubens(PCP) ordered referral. [place]der [place]pura([place] aware and will take care of faxing referral.  I spent 60 minutes providing this consultation,  from 10:30am to 12:30pm. More than 50% of the time in this consultation was spent coordinating communication. This included TC to the family, chart review, updating medication list, and charting.  CODE STATUS: DNR, MOST details: DNR/DNI, Limited additional scope of medical interventions, Yes to antibiotics, yes to IVFs.   PPS: 30% HOSPICE ELIGIBILITY/DIAGNOSIS: Yes, Protein Calorie Malnutrition  PAST MEDICAL HISTORY:  Past Medical History:  Diagnosis Date  .  ALLERGIC RHINITIS   . ANEMIA-NOS   . AORTIC  SCLEROSIS   . Asthma   . CARDIOMYOPATHY, ISCHEMIC   . CAROTID BRUIT, RIGHT 02/27/2008  . Cataract    surgery  . CML (chronic myeloid leukemia) ([place]) 06/26/2015  . COPD   . CORONARY ARTERY DISEASE    a. s/p CABG in 1995 b. DES in 2008, 2009, and most recent in 2012 with DES to SVG-OM  . DIABETES MELLITUS-TYPE II    diet controlled  . Diastolic dysfunction, Grade 1 11/24/2014  . Diverticulitis of colon with perforation 11/22/2014  . Diverticulosis   . GERD   . HIATAL HERNIA   . Hx of echocardiogram    Echo (9/15):  Mild LVH, EF 50-55%, no RWMA, Gr 1 DD, MAC, mild LAE.  [name] HYPERLIPIDEMIA   . HYPERTENSION   . Hyponatremia 11/22/2014  . IBS (irritable bowel syndrome)   . LACTOSE INTOLERANCE   . OA (osteoarthritis)   . OBESITY   . On home oxygen therapy    "3L all the time" (10/12/2016)  . Partial small bowel obstruction ([place])   . PERIPHERAL VASCULAR DISEASE   . Primary hyperparathyroidism ([place])    Lab Results Component Value Date  PTH 150.7* 02/13/2013  CALCIUM 11.0* 02/13/2013  CAION 1.21 03/15/2008    . Prostate cancer ([place])    seed implants 2004  . SICK SINUS/ TACHY-BRADY SYNDROME 09/2007   s/p PPM st judes  . Sleep apnea   . SMALL BOWEL OBSTRUCTION 04/18/2009   Qualifier: History of  By: Asa Lente MD, Jannifer Rodney Symptomatic diverticulosis 01/18/2009   Qualifier: Diagnosis of  By: Shane Crutch, Amy S     SOCIAL HX:  Social History   Tobacco Use  . Smoking status: Former Smoker    Packs/day: 1.00    Years: 25.00    Pack years: 25.00    Types: Cigarettes    Last attempt to quit: 06/08/1994    Years since quitting: 24.0  . Smokeless tobacco: Never Used  Substance Use Topics  . Alcohol use: No    Alcohol/week: 0.0 standard drinks    ALLERGIES:  Allergies  Allergen Reactions  . Actos [Pioglitazone Hydrochloride] Other (See Comments)    "felt funny, drowsy, and weak":  [name] Buprenorphine Hcl Nausea And Vomiting  . Celebrex [Celecoxib] Other (See Comments)    "felt funny"  .  Demerol Palpitations and Other (See Comments)    Increased BP  . Meperidine Palpitations    Other reaction(s): Other (See Comments) Increased BP  . Morphine And Related Nausea And Vomiting  . Ciprofloxacin Other (See Comments)    arthralgia  . Metformin Nausea And Vomiting  . Zocor [Simvastatin] Other (See Comments)    Makes pt very drowsy     PERTINENT MEDICATIONS:  Outpatient Encounter Medications as of 06/14/2018  Medication Sig  . acetaminophen (TYLENOL) 500 MG tablet Take 1,000 mg by mouth every 6 (six) hours as needed for mild pain or fever.  [name] amiodarone (PACERONE) 200 MG tablet Take 1 tablet (200 mg total) by mouth 2 (two) times daily.  [name] apixaban (ELIQUIS) 5 MG TABS tablet Take 2.5 mg by mouth 2 (two) times daily.  [name] aspirin EC 81 MG EC tablet Take 1 tablet (81 mg total) by mouth daily.  . budesonide (PULMICORT) 0.5 MG/2ML nebulizer solution Take 0.5 mg by nebulization 2 (two) times daily.  [name] diltiazem (CARDIZEM CD) 180 MG 24 hr capsule Take 1 capsule (180 mg total) by mouth  daily.  . ferrous sulfate 325 (65 FE) MG tablet Take 1 tablet (325 mg total) by mouth daily with breakfast.  . fluticasone (FLONASE) 50 MCG/ACT nasal spray Place 2 sprays into both nostrils daily. Allergies  . furosemide (LASIX) 40 MG tablet Take 1 tablet (40 mg total) by mouth 2 (two) times daily.  [name] glucose blood (ONE TOUCH ULTRA TEST) test strip USE TO CHECK BLOOD SUGARS ONCE A DAY  . guaiFENesin (MUCINEX) 600 MG 12 hr tablet Take 2 tablets (1,200 mg total) by mouth 2 (two) times daily.  [name] HYDROcodone-acetaminophen (NORCO) 7.5-325 MG tablet Take 1 tablet by mouth every 6 (six) hours as needed for moderate pain or severe pain.  [name] ipratropium-albuterol (DUONEB) 0.5-2.5 (3) MG/3ML SOLN Take 3 mLs by nebulization 3 (three) times daily.   . isosorbide mononitrate (IMDUR) 30 MG 24 hr tablet Take 0.5 tablets (15 mg total) by mouth daily.  . ondansetron (ZOFRAN) 4 MG tablet Take 4 mg by mouth every 8 (eight) hours  as needed for nausea or vomiting.  . pravastatin (PRAVACHOL) 20 MG tablet Take 20 mg by mouth daily.   . ranitidine (ZANTAC) 300 MG tablet Take 600 mg by mouth at bedtime.   . senna (SENOKOT) 8.6 MG tablet Take 2 tablets by mouth daily.   . sertraline (ZOLOFT) 25 MG tablet Take 1 tablet (25 mg total) by mouth daily.  [name] guaiFENesin-dextromethorphan (ROBITUSSIN DM) 100-10 MG/5ML syrup Take 5 mLs by mouth every 4 (four) hours as needed for cough. (Patient not taking: Reported on 06/14/2018)  . insulin aspart (NOVOLOG) 100 UNIT/ML injection Inject 0-5 Units into the skin at bedtime. Insulin sliding scale (Patient taking differently: Inject 0-9 Units into the skin 3 (three) times daily with meals. Insulin sliding scale  101-150=1 units 151-200=2 units 201-250=3 units 251-300=5 units 301-350=7 units >350=9 units Call MD for BS>400 or <60)  . [DISCONTINUED] budesonide (PULMICORT) 0.25 MG/2ML nebulizer solution Take 2 mLs (0.25 mg total) by nebulization 2 (two) times daily. (Patient taking differently: Take 0.5 mg by nebulization 2 (two) times daily. )   No facility-administered encounter medications on file as of 06/14/2018.     PHYSICAL EXAM:  Frail appearing elderly male with sad and flat affect, lying supine with HOB elevated. He appears very fatigued. Cardiovascular: regular rate and rhythm without MRG Pulmonary: clear ant fields Abdomen: soft, nontender, + bowel sounds. Large left lower abdominal hernia with colostomy bag with soft stool. Percutanous gallbladder drainage tube in place draining well left mid/side abdomen. Extremities: mild LE edema, no joint deformities. Skin: no rashes Neurological: Marked weakness but otherwise nonfocal  Julianne Handler, NP

## 2018-06-15 DIAGNOSIS — F331 Major depressive disorder, recurrent, moderate: Secondary | ICD-10-CM | POA: Diagnosis not present

## 2018-06-15 DIAGNOSIS — F4323 Adjustment disorder with mixed anxiety and depressed mood: Secondary | ICD-10-CM | POA: Diagnosis not present

## 2018-06-16 ENCOUNTER — Telehealth: Payer: Self-pay

## 2018-06-16 DIAGNOSIS — I495 Sick sinus syndrome: Secondary | ICD-10-CM | POA: Diagnosis not present

## 2018-06-16 DIAGNOSIS — I10 Essential (primary) hypertension: Secondary | ICD-10-CM | POA: Diagnosis not present

## 2018-06-16 DIAGNOSIS — N183 Chronic kidney disease, stage 3 (moderate): Secondary | ICD-10-CM | POA: Diagnosis not present

## 2018-06-16 DIAGNOSIS — K219 Gastro-esophageal reflux disease without esophagitis: Secondary | ICD-10-CM | POA: Diagnosis not present

## 2018-06-16 DIAGNOSIS — R1310 Dysphagia, unspecified: Secondary | ICD-10-CM | POA: Diagnosis not present

## 2018-06-16 DIAGNOSIS — C61 Malignant neoplasm of prostate: Secondary | ICD-10-CM | POA: Diagnosis not present

## 2018-06-16 DIAGNOSIS — Q845 Enlarged and hypertrophic nails: Secondary | ICD-10-CM | POA: Diagnosis not present

## 2018-06-16 DIAGNOSIS — I25810 Atherosclerosis of coronary artery bypass graft(s) without angina pectoris: Secondary | ICD-10-CM | POA: Diagnosis not present

## 2018-06-16 DIAGNOSIS — I739 Peripheral vascular disease, unspecified: Secondary | ICD-10-CM | POA: Diagnosis not present

## 2018-06-16 DIAGNOSIS — J309 Allergic rhinitis, unspecified: Secondary | ICD-10-CM | POA: Diagnosis not present

## 2018-06-16 DIAGNOSIS — I5030 Unspecified diastolic (congestive) heart failure: Secondary | ICD-10-CM | POA: Diagnosis not present

## 2018-06-16 DIAGNOSIS — J449 Chronic obstructive pulmonary disease, unspecified: Secondary | ICD-10-CM | POA: Diagnosis not present

## 2018-06-16 DIAGNOSIS — I4891 Unspecified atrial fibrillation: Secondary | ICD-10-CM | POA: Diagnosis not present

## 2018-06-16 DIAGNOSIS — H11149 Conjunctival xerosis, unspecified, unspecified eye: Secondary | ICD-10-CM | POA: Diagnosis not present

## 2018-06-16 DIAGNOSIS — L603 Nail dystrophy: Secondary | ICD-10-CM | POA: Diagnosis not present

## 2018-06-16 DIAGNOSIS — E1159 Type 2 diabetes mellitus with other circulatory complications: Secondary | ICD-10-CM | POA: Diagnosis not present

## 2018-06-16 DIAGNOSIS — E785 Hyperlipidemia, unspecified: Secondary | ICD-10-CM | POA: Diagnosis not present

## 2018-06-16 DIAGNOSIS — G4733 Obstructive sleep apnea (adult) (pediatric): Secondary | ICD-10-CM | POA: Diagnosis not present

## 2018-06-16 DIAGNOSIS — B351 Tinea unguium: Secondary | ICD-10-CM | POA: Diagnosis not present

## 2018-06-16 DIAGNOSIS — C9290 Myeloid leukemia, unspecified, not having achieved remission: Secondary | ICD-10-CM | POA: Diagnosis not present

## 2018-06-16 NOTE — Telephone Encounter (Signed)
Copied from [place] [id number]. Topic: General - Inquiry >> Jun 16, 2018 11:39 AM Vernona Rieger wrote: Reason for CRM: Morey Hummingbird RN case manager called from [facility] [employer] [facility] called and said that she has tried several attempts for a month or more trying to get in touch with the patient. She said he is in their heart failure program and has one of there devices. She wants to make sure the patient is okay as she spoke with him on a monthly basis. She has confirmed both numbers with me in his chart and the house number has been disconnected and the cell phone is going straight to voicemail. She would like to speak with Dr Sharlet Salina or her nurse and she can be reached at [phone] and her ext is [phone]

## 2018-06-16 NOTE — Telephone Encounter (Signed)
Got a hold of patients sister and patient is a permanent resident of [facility]  Case manager informed

## 2018-06-17 DIAGNOSIS — I739 Peripheral vascular disease, unspecified: Secondary | ICD-10-CM | POA: Diagnosis not present

## 2018-06-17 DIAGNOSIS — I4891 Unspecified atrial fibrillation: Secondary | ICD-10-CM | POA: Diagnosis not present

## 2018-06-17 DIAGNOSIS — N183 Chronic kidney disease, stage 3 (moderate): Secondary | ICD-10-CM | POA: Diagnosis not present

## 2018-06-17 DIAGNOSIS — I25810 Atherosclerosis of coronary artery bypass graft(s) without angina pectoris: Secondary | ICD-10-CM | POA: Diagnosis not present

## 2018-06-17 DIAGNOSIS — J449 Chronic obstructive pulmonary disease, unspecified: Secondary | ICD-10-CM | POA: Diagnosis not present

## 2018-06-17 DIAGNOSIS — I5030 Unspecified diastolic (congestive) heart failure: Secondary | ICD-10-CM | POA: Diagnosis not present

## 2018-06-21 DIAGNOSIS — I25810 Atherosclerosis of coronary artery bypass graft(s) without angina pectoris: Secondary | ICD-10-CM | POA: Diagnosis not present

## 2018-06-21 DIAGNOSIS — I4891 Unspecified atrial fibrillation: Secondary | ICD-10-CM | POA: Diagnosis not present

## 2018-06-21 DIAGNOSIS — J449 Chronic obstructive pulmonary disease, unspecified: Secondary | ICD-10-CM | POA: Diagnosis not present

## 2018-06-21 DIAGNOSIS — N183 Chronic kidney disease, stage 3 (moderate): Secondary | ICD-10-CM | POA: Diagnosis not present

## 2018-06-21 DIAGNOSIS — I5030 Unspecified diastolic (congestive) heart failure: Secondary | ICD-10-CM | POA: Diagnosis not present

## 2018-06-21 DIAGNOSIS — F331 Major depressive disorder, recurrent, moderate: Secondary | ICD-10-CM | POA: Diagnosis not present

## 2018-06-21 DIAGNOSIS — I739 Peripheral vascular disease, unspecified: Secondary | ICD-10-CM | POA: Diagnosis not present

## 2018-06-22 DIAGNOSIS — I25810 Atherosclerosis of coronary artery bypass graft(s) without angina pectoris: Secondary | ICD-10-CM | POA: Diagnosis not present

## 2018-06-22 DIAGNOSIS — I739 Peripheral vascular disease, unspecified: Secondary | ICD-10-CM | POA: Diagnosis not present

## 2018-06-22 DIAGNOSIS — I5030 Unspecified diastolic (congestive) heart failure: Secondary | ICD-10-CM | POA: Diagnosis not present

## 2018-06-22 DIAGNOSIS — N183 Chronic kidney disease, stage 3 (moderate): Secondary | ICD-10-CM | POA: Diagnosis not present

## 2018-06-22 DIAGNOSIS — J449 Chronic obstructive pulmonary disease, unspecified: Secondary | ICD-10-CM | POA: Diagnosis not present

## 2018-06-22 DIAGNOSIS — I4891 Unspecified atrial fibrillation: Secondary | ICD-10-CM | POA: Diagnosis not present

## 2018-06-22 DIAGNOSIS — F4323 Adjustment disorder with mixed anxiety and depressed mood: Secondary | ICD-10-CM | POA: Diagnosis not present

## 2018-06-23 ENCOUNTER — Other Ambulatory Visit (HOSPITAL_COMMUNITY): Payer: Self-pay

## 2018-06-24 DIAGNOSIS — I5030 Unspecified diastolic (congestive) heart failure: Secondary | ICD-10-CM | POA: Diagnosis not present

## 2018-06-24 DIAGNOSIS — I739 Peripheral vascular disease, unspecified: Secondary | ICD-10-CM | POA: Diagnosis not present

## 2018-06-24 DIAGNOSIS — J449 Chronic obstructive pulmonary disease, unspecified: Secondary | ICD-10-CM | POA: Diagnosis not present

## 2018-06-24 DIAGNOSIS — I25810 Atherosclerosis of coronary artery bypass graft(s) without angina pectoris: Secondary | ICD-10-CM | POA: Diagnosis not present

## 2018-06-24 DIAGNOSIS — N183 Chronic kidney disease, stage 3 (moderate): Secondary | ICD-10-CM | POA: Diagnosis not present

## 2018-06-24 DIAGNOSIS — I4891 Unspecified atrial fibrillation: Secondary | ICD-10-CM | POA: Diagnosis not present

## 2018-06-27 DIAGNOSIS — J449 Chronic obstructive pulmonary disease, unspecified: Secondary | ICD-10-CM | POA: Diagnosis not present

## 2018-06-27 DIAGNOSIS — I739 Peripheral vascular disease, unspecified: Secondary | ICD-10-CM | POA: Diagnosis not present

## 2018-06-27 DIAGNOSIS — I4891 Unspecified atrial fibrillation: Secondary | ICD-10-CM | POA: Diagnosis not present

## 2018-06-27 DIAGNOSIS — N183 Chronic kidney disease, stage 3 (moderate): Secondary | ICD-10-CM | POA: Diagnosis not present

## 2018-06-27 DIAGNOSIS — I25810 Atherosclerosis of coronary artery bypass graft(s) without angina pectoris: Secondary | ICD-10-CM | POA: Diagnosis not present

## 2018-06-27 DIAGNOSIS — I5030 Unspecified diastolic (congestive) heart failure: Secondary | ICD-10-CM | POA: Diagnosis not present

## 2018-06-29 DIAGNOSIS — I25810 Atherosclerosis of coronary artery bypass graft(s) without angina pectoris: Secondary | ICD-10-CM | POA: Diagnosis not present

## 2018-06-29 DIAGNOSIS — J449 Chronic obstructive pulmonary disease, unspecified: Secondary | ICD-10-CM | POA: Diagnosis not present

## 2018-06-29 DIAGNOSIS — I739 Peripheral vascular disease, unspecified: Secondary | ICD-10-CM | POA: Diagnosis not present

## 2018-06-29 DIAGNOSIS — F331 Major depressive disorder, recurrent, moderate: Secondary | ICD-10-CM | POA: Diagnosis not present

## 2018-06-29 DIAGNOSIS — I4891 Unspecified atrial fibrillation: Secondary | ICD-10-CM | POA: Diagnosis not present

## 2018-06-29 DIAGNOSIS — N183 Chronic kidney disease, stage 3 (moderate): Secondary | ICD-10-CM | POA: Diagnosis not present

## 2018-06-29 DIAGNOSIS — I5030 Unspecified diastolic (congestive) heart failure: Secondary | ICD-10-CM | POA: Diagnosis not present

## 2018-06-29 DIAGNOSIS — R634 Abnormal weight loss: Secondary | ICD-10-CM | POA: Diagnosis not present

## 2018-06-29 DIAGNOSIS — R110 Nausea: Secondary | ICD-10-CM | POA: Diagnosis not present

## 2018-06-29 DIAGNOSIS — K810 Acute cholecystitis: Secondary | ICD-10-CM | POA: Diagnosis not present

## 2018-06-30 DIAGNOSIS — I509 Heart failure, unspecified: Secondary | ICD-10-CM | POA: Diagnosis not present

## 2018-06-30 DIAGNOSIS — I2510 Atherosclerotic heart disease of native coronary artery without angina pectoris: Secondary | ICD-10-CM | POA: Diagnosis not present

## 2018-06-30 DIAGNOSIS — F0390 Unspecified dementia without behavioral disturbance: Secondary | ICD-10-CM | POA: Diagnosis not present

## 2018-06-30 DIAGNOSIS — N183 Chronic kidney disease, stage 3 (moderate): Secondary | ICD-10-CM | POA: Diagnosis not present

## 2018-06-30 DIAGNOSIS — I4891 Unspecified atrial fibrillation: Secondary | ICD-10-CM | POA: Diagnosis not present

## 2018-07-01 DIAGNOSIS — J449 Chronic obstructive pulmonary disease, unspecified: Secondary | ICD-10-CM | POA: Diagnosis not present

## 2018-07-01 DIAGNOSIS — I5030 Unspecified diastolic (congestive) heart failure: Secondary | ICD-10-CM | POA: Diagnosis not present

## 2018-07-01 DIAGNOSIS — I25810 Atherosclerosis of coronary artery bypass graft(s) without angina pectoris: Secondary | ICD-10-CM | POA: Diagnosis not present

## 2018-07-01 DIAGNOSIS — I739 Peripheral vascular disease, unspecified: Secondary | ICD-10-CM | POA: Diagnosis not present

## 2018-07-01 DIAGNOSIS — N183 Chronic kidney disease, stage 3 (moderate): Secondary | ICD-10-CM | POA: Diagnosis not present

## 2018-07-01 DIAGNOSIS — I4891 Unspecified atrial fibrillation: Secondary | ICD-10-CM | POA: Diagnosis not present

## 2018-07-04 DIAGNOSIS — I25810 Atherosclerosis of coronary artery bypass graft(s) without angina pectoris: Secondary | ICD-10-CM | POA: Diagnosis not present

## 2018-07-04 DIAGNOSIS — I739 Peripheral vascular disease, unspecified: Secondary | ICD-10-CM | POA: Diagnosis not present

## 2018-07-04 DIAGNOSIS — I4891 Unspecified atrial fibrillation: Secondary | ICD-10-CM | POA: Diagnosis not present

## 2018-07-04 DIAGNOSIS — J449 Chronic obstructive pulmonary disease, unspecified: Secondary | ICD-10-CM | POA: Diagnosis not present

## 2018-07-04 DIAGNOSIS — N183 Chronic kidney disease, stage 3 (moderate): Secondary | ICD-10-CM | POA: Diagnosis not present

## 2018-07-04 DIAGNOSIS — I5030 Unspecified diastolic (congestive) heart failure: Secondary | ICD-10-CM | POA: Diagnosis not present

## 2018-07-06 DIAGNOSIS — I5030 Unspecified diastolic (congestive) heart failure: Secondary | ICD-10-CM | POA: Diagnosis not present

## 2018-07-06 DIAGNOSIS — I25810 Atherosclerosis of coronary artery bypass graft(s) without angina pectoris: Secondary | ICD-10-CM | POA: Diagnosis not present

## 2018-07-06 DIAGNOSIS — N183 Chronic kidney disease, stage 3 (moderate): Secondary | ICD-10-CM | POA: Diagnosis not present

## 2018-07-06 DIAGNOSIS — I4891 Unspecified atrial fibrillation: Secondary | ICD-10-CM | POA: Diagnosis not present

## 2018-07-06 DIAGNOSIS — F331 Major depressive disorder, recurrent, moderate: Secondary | ICD-10-CM | POA: Diagnosis not present

## 2018-07-06 DIAGNOSIS — J449 Chronic obstructive pulmonary disease, unspecified: Secondary | ICD-10-CM | POA: Diagnosis not present

## 2018-07-06 DIAGNOSIS — I739 Peripheral vascular disease, unspecified: Secondary | ICD-10-CM | POA: Diagnosis not present

## 2018-07-07 ENCOUNTER — Other Ambulatory Visit (HOSPITAL_COMMUNITY): Payer: Self-pay

## 2018-07-07 DIAGNOSIS — N183 Chronic kidney disease, stage 3 (moderate): Secondary | ICD-10-CM | POA: Diagnosis not present

## 2018-07-07 DIAGNOSIS — I5030 Unspecified diastolic (congestive) heart failure: Secondary | ICD-10-CM | POA: Diagnosis not present

## 2018-07-07 DIAGNOSIS — J449 Chronic obstructive pulmonary disease, unspecified: Secondary | ICD-10-CM | POA: Diagnosis not present

## 2018-07-07 DIAGNOSIS — I25810 Atherosclerosis of coronary artery bypass graft(s) without angina pectoris: Secondary | ICD-10-CM | POA: Diagnosis not present

## 2018-07-07 DIAGNOSIS — I739 Peripheral vascular disease, unspecified: Secondary | ICD-10-CM | POA: Diagnosis not present

## 2018-07-07 DIAGNOSIS — I4891 Unspecified atrial fibrillation: Secondary | ICD-10-CM | POA: Diagnosis not present

## 2018-07-08 DIAGNOSIS — I4891 Unspecified atrial fibrillation: Secondary | ICD-10-CM | POA: Diagnosis not present

## 2018-07-08 DIAGNOSIS — F331 Major depressive disorder, recurrent, moderate: Secondary | ICD-10-CM | POA: Diagnosis not present

## 2018-07-08 DIAGNOSIS — I25810 Atherosclerosis of coronary artery bypass graft(s) without angina pectoris: Secondary | ICD-10-CM | POA: Diagnosis not present

## 2018-07-08 DIAGNOSIS — J449 Chronic obstructive pulmonary disease, unspecified: Secondary | ICD-10-CM | POA: Diagnosis not present

## 2018-07-08 DIAGNOSIS — I5030 Unspecified diastolic (congestive) heart failure: Secondary | ICD-10-CM | POA: Diagnosis not present

## 2018-07-08 DIAGNOSIS — I739 Peripheral vascular disease, unspecified: Secondary | ICD-10-CM | POA: Diagnosis not present

## 2018-07-08 DIAGNOSIS — N183 Chronic kidney disease, stage 3 (moderate): Secondary | ICD-10-CM | POA: Diagnosis not present

## 2018-07-08 DIAGNOSIS — F29 Unspecified psychosis not due to a substance or known physiological condition: Secondary | ICD-10-CM | POA: Diagnosis not present

## 2018-07-09 DIAGNOSIS — E1159 Type 2 diabetes mellitus with other circulatory complications: Secondary | ICD-10-CM | POA: Diagnosis not present

## 2018-07-09 DIAGNOSIS — R1310 Dysphagia, unspecified: Secondary | ICD-10-CM | POA: Diagnosis not present

## 2018-07-09 DIAGNOSIS — M79602 Pain in left arm: Secondary | ICD-10-CM | POA: Diagnosis not present

## 2018-07-09 DIAGNOSIS — M79642 Pain in left hand: Secondary | ICD-10-CM | POA: Diagnosis not present

## 2018-07-09 DIAGNOSIS — E785 Hyperlipidemia, unspecified: Secondary | ICD-10-CM | POA: Diagnosis not present

## 2018-07-09 DIAGNOSIS — M79632 Pain in left forearm: Secondary | ICD-10-CM | POA: Diagnosis not present

## 2018-07-09 DIAGNOSIS — M25532 Pain in left wrist: Secondary | ICD-10-CM | POA: Diagnosis not present

## 2018-07-09 DIAGNOSIS — N183 Chronic kidney disease, stage 3 (moderate): Secondary | ICD-10-CM | POA: Diagnosis not present

## 2018-07-09 DIAGNOSIS — M25512 Pain in left shoulder: Secondary | ICD-10-CM | POA: Diagnosis not present

## 2018-07-09 DIAGNOSIS — H11149 Conjunctival xerosis, unspecified, unspecified eye: Secondary | ICD-10-CM | POA: Diagnosis not present

## 2018-07-09 DIAGNOSIS — M25522 Pain in left elbow: Secondary | ICD-10-CM | POA: Diagnosis not present

## 2018-07-09 DIAGNOSIS — I5030 Unspecified diastolic (congestive) heart failure: Secondary | ICD-10-CM | POA: Diagnosis not present

## 2018-07-09 DIAGNOSIS — K219 Gastro-esophageal reflux disease without esophagitis: Secondary | ICD-10-CM | POA: Diagnosis not present

## 2018-07-09 DIAGNOSIS — I495 Sick sinus syndrome: Secondary | ICD-10-CM | POA: Diagnosis not present

## 2018-07-09 DIAGNOSIS — I4891 Unspecified atrial fibrillation: Secondary | ICD-10-CM | POA: Diagnosis not present

## 2018-07-09 DIAGNOSIS — C61 Malignant neoplasm of prostate: Secondary | ICD-10-CM | POA: Diagnosis not present

## 2018-07-09 DIAGNOSIS — G4733 Obstructive sleep apnea (adult) (pediatric): Secondary | ICD-10-CM | POA: Diagnosis not present

## 2018-07-09 DIAGNOSIS — I25810 Atherosclerosis of coronary artery bypass graft(s) without angina pectoris: Secondary | ICD-10-CM | POA: Diagnosis not present

## 2018-07-09 DIAGNOSIS — I10 Essential (primary) hypertension: Secondary | ICD-10-CM | POA: Diagnosis not present

## 2018-07-09 DIAGNOSIS — C9290 Myeloid leukemia, unspecified, not having achieved remission: Secondary | ICD-10-CM | POA: Diagnosis not present

## 2018-07-09 DIAGNOSIS — I739 Peripheral vascular disease, unspecified: Secondary | ICD-10-CM | POA: Diagnosis not present

## 2018-07-09 DIAGNOSIS — J309 Allergic rhinitis, unspecified: Secondary | ICD-10-CM | POA: Diagnosis not present

## 2018-07-09 DIAGNOSIS — J449 Chronic obstructive pulmonary disease, unspecified: Secondary | ICD-10-CM | POA: Diagnosis not present

## 2018-07-11 ENCOUNTER — Ambulatory Visit: Payer: Medicare Other | Admitting: Internal Medicine

## 2018-07-11 DIAGNOSIS — K810 Acute cholecystitis: Secondary | ICD-10-CM | POA: Diagnosis not present

## 2018-07-11 DIAGNOSIS — R110 Nausea: Secondary | ICD-10-CM | POA: Diagnosis not present

## 2018-07-11 DIAGNOSIS — N183 Chronic kidney disease, stage 3 (moderate): Secondary | ICD-10-CM | POA: Diagnosis not present

## 2018-07-11 DIAGNOSIS — M25512 Pain in left shoulder: Secondary | ICD-10-CM | POA: Diagnosis not present

## 2018-07-11 DIAGNOSIS — I4891 Unspecified atrial fibrillation: Secondary | ICD-10-CM | POA: Diagnosis not present

## 2018-07-11 DIAGNOSIS — J449 Chronic obstructive pulmonary disease, unspecified: Secondary | ICD-10-CM | POA: Diagnosis not present

## 2018-07-11 DIAGNOSIS — I25810 Atherosclerosis of coronary artery bypass graft(s) without angina pectoris: Secondary | ICD-10-CM | POA: Diagnosis not present

## 2018-07-11 DIAGNOSIS — I5030 Unspecified diastolic (congestive) heart failure: Secondary | ICD-10-CM | POA: Diagnosis not present

## 2018-07-11 DIAGNOSIS — I739 Peripheral vascular disease, unspecified: Secondary | ICD-10-CM | POA: Diagnosis not present

## 2018-07-13 DIAGNOSIS — N183 Chronic kidney disease, stage 3 (moderate): Secondary | ICD-10-CM | POA: Diagnosis not present

## 2018-07-13 DIAGNOSIS — I739 Peripheral vascular disease, unspecified: Secondary | ICD-10-CM | POA: Diagnosis not present

## 2018-07-13 DIAGNOSIS — J449 Chronic obstructive pulmonary disease, unspecified: Secondary | ICD-10-CM | POA: Diagnosis not present

## 2018-07-13 DIAGNOSIS — I4891 Unspecified atrial fibrillation: Secondary | ICD-10-CM | POA: Diagnosis not present

## 2018-07-13 DIAGNOSIS — F331 Major depressive disorder, recurrent, moderate: Secondary | ICD-10-CM | POA: Diagnosis not present

## 2018-07-13 DIAGNOSIS — I25810 Atherosclerosis of coronary artery bypass graft(s) without angina pectoris: Secondary | ICD-10-CM | POA: Diagnosis not present

## 2018-07-13 DIAGNOSIS — I5030 Unspecified diastolic (congestive) heart failure: Secondary | ICD-10-CM | POA: Diagnosis not present

## 2018-07-14 DIAGNOSIS — I25810 Atherosclerosis of coronary artery bypass graft(s) without angina pectoris: Secondary | ICD-10-CM | POA: Diagnosis not present

## 2018-07-14 DIAGNOSIS — I4891 Unspecified atrial fibrillation: Secondary | ICD-10-CM | POA: Diagnosis not present

## 2018-07-14 DIAGNOSIS — N183 Chronic kidney disease, stage 3 (moderate): Secondary | ICD-10-CM | POA: Diagnosis not present

## 2018-07-14 DIAGNOSIS — J449 Chronic obstructive pulmonary disease, unspecified: Secondary | ICD-10-CM | POA: Diagnosis not present

## 2018-07-14 DIAGNOSIS — I739 Peripheral vascular disease, unspecified: Secondary | ICD-10-CM | POA: Diagnosis not present

## 2018-07-14 DIAGNOSIS — I5030 Unspecified diastolic (congestive) heart failure: Secondary | ICD-10-CM | POA: Diagnosis not present

## 2018-07-15 DIAGNOSIS — I739 Peripheral vascular disease, unspecified: Secondary | ICD-10-CM | POA: Diagnosis not present

## 2018-07-15 DIAGNOSIS — J449 Chronic obstructive pulmonary disease, unspecified: Secondary | ICD-10-CM | POA: Diagnosis not present

## 2018-07-15 DIAGNOSIS — I25810 Atherosclerosis of coronary artery bypass graft(s) without angina pectoris: Secondary | ICD-10-CM | POA: Diagnosis not present

## 2018-07-15 DIAGNOSIS — N183 Chronic kidney disease, stage 3 (moderate): Secondary | ICD-10-CM | POA: Diagnosis not present

## 2018-07-15 DIAGNOSIS — I4891 Unspecified atrial fibrillation: Secondary | ICD-10-CM | POA: Diagnosis not present

## 2018-07-15 DIAGNOSIS — I5030 Unspecified diastolic (congestive) heart failure: Secondary | ICD-10-CM | POA: Diagnosis not present

## 2018-07-17 DIAGNOSIS — I4891 Unspecified atrial fibrillation: Secondary | ICD-10-CM | POA: Diagnosis not present

## 2018-07-17 DIAGNOSIS — J449 Chronic obstructive pulmonary disease, unspecified: Secondary | ICD-10-CM | POA: Diagnosis not present

## 2018-07-17 DIAGNOSIS — N183 Chronic kidney disease, stage 3 (moderate): Secondary | ICD-10-CM | POA: Diagnosis not present

## 2018-07-17 DIAGNOSIS — I5030 Unspecified diastolic (congestive) heart failure: Secondary | ICD-10-CM | POA: Diagnosis not present

## 2018-07-17 DIAGNOSIS — I25810 Atherosclerosis of coronary artery bypass graft(s) without angina pectoris: Secondary | ICD-10-CM | POA: Diagnosis not present

## 2018-07-17 DIAGNOSIS — I739 Peripheral vascular disease, unspecified: Secondary | ICD-10-CM | POA: Diagnosis not present

## 2018-07-19 DIAGNOSIS — I5042 Chronic combined systolic (congestive) and diastolic (congestive) heart failure: Secondary | ICD-10-CM | POA: Diagnosis not present

## 2018-07-19 DIAGNOSIS — J449 Chronic obstructive pulmonary disease, unspecified: Secondary | ICD-10-CM | POA: Diagnosis not present

## 2018-07-19 DIAGNOSIS — R627 Adult failure to thrive: Secondary | ICD-10-CM | POA: Diagnosis not present

## 2018-07-19 DIAGNOSIS — C92Z0 Other myeloid leukemia not having achieved remission: Secondary | ICD-10-CM | POA: Diagnosis not present

## 2018-07-20 DIAGNOSIS — I739 Peripheral vascular disease, unspecified: Secondary | ICD-10-CM | POA: Diagnosis not present

## 2018-07-20 DIAGNOSIS — I5030 Unspecified diastolic (congestive) heart failure: Secondary | ICD-10-CM | POA: Diagnosis not present

## 2018-07-20 DIAGNOSIS — I4891 Unspecified atrial fibrillation: Secondary | ICD-10-CM | POA: Diagnosis not present

## 2018-07-20 DIAGNOSIS — I25810 Atherosclerosis of coronary artery bypass graft(s) without angina pectoris: Secondary | ICD-10-CM | POA: Diagnosis not present

## 2018-07-20 DIAGNOSIS — J449 Chronic obstructive pulmonary disease, unspecified: Secondary | ICD-10-CM | POA: Diagnosis not present

## 2018-07-20 DIAGNOSIS — N183 Chronic kidney disease, stage 3 (moderate): Secondary | ICD-10-CM | POA: Diagnosis not present

## 2018-07-21 DIAGNOSIS — I25810 Atherosclerosis of coronary artery bypass graft(s) without angina pectoris: Secondary | ICD-10-CM | POA: Diagnosis not present

## 2018-07-21 DIAGNOSIS — J449 Chronic obstructive pulmonary disease, unspecified: Secondary | ICD-10-CM | POA: Diagnosis not present

## 2018-07-21 DIAGNOSIS — N183 Chronic kidney disease, stage 3 (moderate): Secondary | ICD-10-CM | POA: Diagnosis not present

## 2018-07-21 DIAGNOSIS — I739 Peripheral vascular disease, unspecified: Secondary | ICD-10-CM | POA: Diagnosis not present

## 2018-07-21 DIAGNOSIS — I4891 Unspecified atrial fibrillation: Secondary | ICD-10-CM | POA: Diagnosis not present

## 2018-07-21 DIAGNOSIS — I5030 Unspecified diastolic (congestive) heart failure: Secondary | ICD-10-CM | POA: Diagnosis not present

## 2018-07-22 DIAGNOSIS — C92Z0 Other myeloid leukemia not having achieved remission: Secondary | ICD-10-CM | POA: Diagnosis not present

## 2018-07-22 DIAGNOSIS — N183 Chronic kidney disease, stage 3 (moderate): Secondary | ICD-10-CM | POA: Diagnosis not present

## 2018-07-22 DIAGNOSIS — I739 Peripheral vascular disease, unspecified: Secondary | ICD-10-CM | POA: Diagnosis not present

## 2018-07-22 DIAGNOSIS — I5042 Chronic combined systolic (congestive) and diastolic (congestive) heart failure: Secondary | ICD-10-CM | POA: Diagnosis not present

## 2018-07-22 DIAGNOSIS — J449 Chronic obstructive pulmonary disease, unspecified: Secondary | ICD-10-CM | POA: Diagnosis not present

## 2018-07-22 DIAGNOSIS — I5030 Unspecified diastolic (congestive) heart failure: Secondary | ICD-10-CM | POA: Diagnosis not present

## 2018-07-22 DIAGNOSIS — I4891 Unspecified atrial fibrillation: Secondary | ICD-10-CM | POA: Diagnosis not present

## 2018-07-22 DIAGNOSIS — I25810 Atherosclerosis of coronary artery bypass graft(s) without angina pectoris: Secondary | ICD-10-CM | POA: Diagnosis not present

## 2018-08-07 DEATH — deceased

## 2018-09-27 ENCOUNTER — Other Ambulatory Visit: Payer: Self-pay | Admitting: *Deleted

## 2018-09-27 NOTE — Patient Outreach (Signed)
[facility]) Care Management  09/27/2018  Eric [name] 20-Apr-1927 [id number]   Medicare High Risk Screening Call   Unsuccessful outreach to [name] for Medicare high risk screening call. Unable to leave message on home answering machine.  Per medical record, [name] is a permament resident of [facility] skilled nursing facility and is under the care of Hospice.    Plan:  Will consult with [place] to determine if other outreach attempts to patient are appropriate.   Barrington Ellison RN,CCM,CDE [place] Management Coordinator Office Phone [phone] Office Fax [phone]

## 2018-10-13 ENCOUNTER — Other Ambulatory Visit: Payer: Self-pay | Admitting: *Deleted

## 2018-10-13 NOTE — Patient Outreach (Signed)
[facility]) Care Management  10/13/2018  BRYCETON HANTZ 23-Mar-1927 [id number]  Case Closure Note  Per medical record, Mr. Coury is a permament resident of [facility] skilled nursing facility and is under the care of Hospice.   Plan: Conferred with [facility] person Bary Castilla, will not pursue further attempts at this time to reach patient for Medicare high risk screening call.   Barrington Ellison RN,CCM,CDE [place] Management Coordinator Office Phone [phone] Office Fax [phone]

## 2018-11-09 ENCOUNTER — Telehealth: Payer: Self-pay | Admitting: Student

## 2018-11-09 NOTE — Telephone Encounter (Signed)
Attempted to call patient to see if he is still participating in Telephonic monitoring. No answer on home number. VM not set up on cell phone.

## 2018-11-10 NOTE — Telephone Encounter (Signed)
Attempted to call pt. No answer and unable to leave a message.

## 2018-11-11 NOTE — Telephone Encounter (Signed)
3rd attempt.   No answer and unable to leave a message.

## 2018-11-16 NOTE — Telephone Encounter (Signed)
Fourth attempt:  No answer, unable to leave VM (just kept ringing).  Attempted cell phone, VM not set up.

## 2018-12-06 NOTE — Telephone Encounter (Signed)
Reviewed Palliative Care note from 06/14/18, pt is a resident at [facility]. Contacted [facility], confirmed with medical records department that pt expired on 2018/08/06.

## 2019-03-18 IMAGING — CT CT CHEST W/O CM
2 of 3 series · 15 of 36 positions shown, 18 images · non-contrast
Comparison: Chest x-ray 05/11/2018.  Chest CT 06/03/2016.

CLINICAL DATA: Concern for empyema. Chest pain, shortness of
breath. Pleural effusions suspected.

EXAM:
CT CHEST WITHOUT CONTRAST
TECHNIQUE: Multidetector CT imaging of the chest was performed following the
standard protocol without IV contrast.

[Series 2: thorax · axial · 0.86mm/px · z∈[-86,+190]mm · 12 of 164 slices shown, 15 images]
[im 13/164  mediastinal]
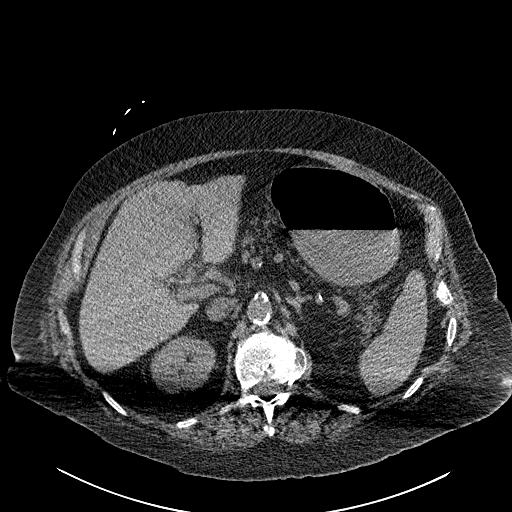
[im 13/164  lung]
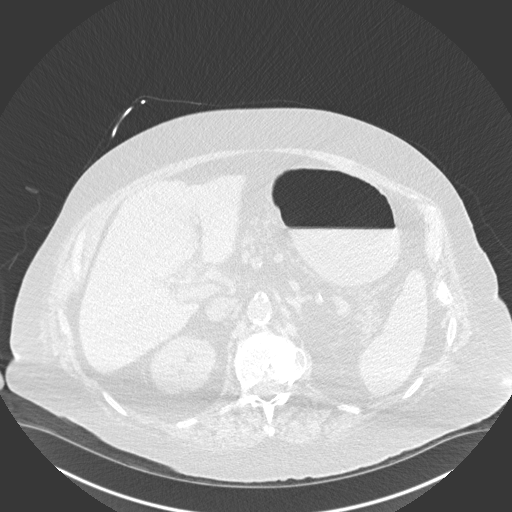
[im 25/164  lung]
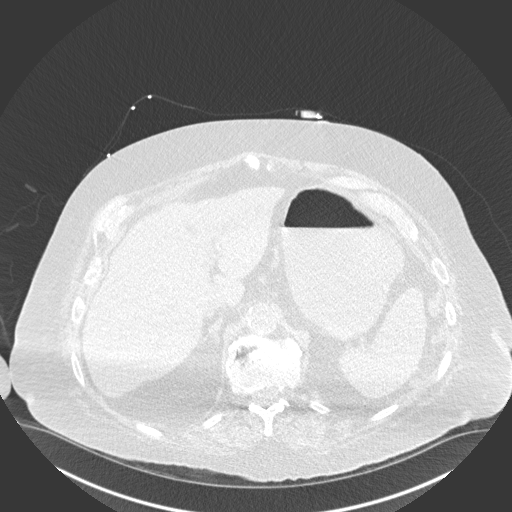
[im 37/164  lung]
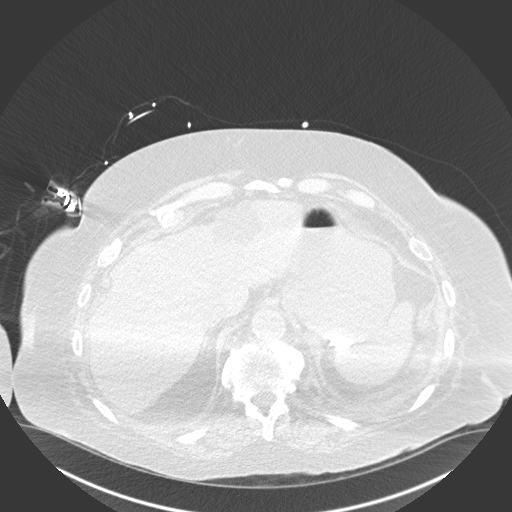
[im 49/164  lung]
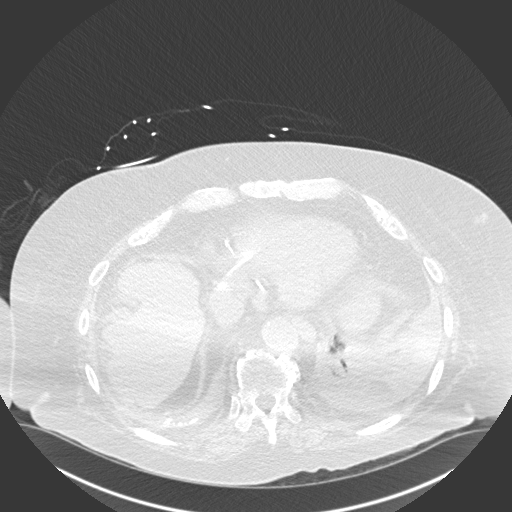
[im 61/164  mediastinal]
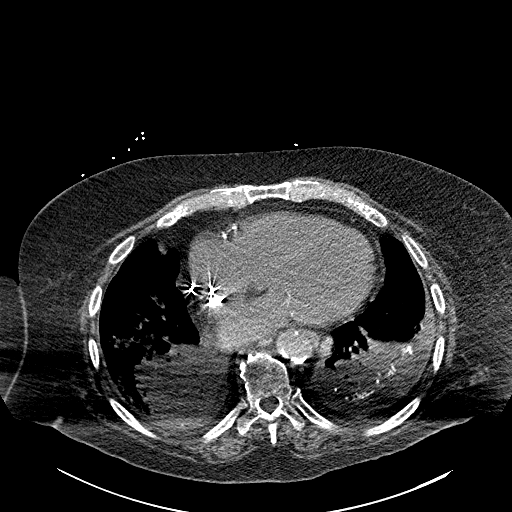
[im 61/164  lung]
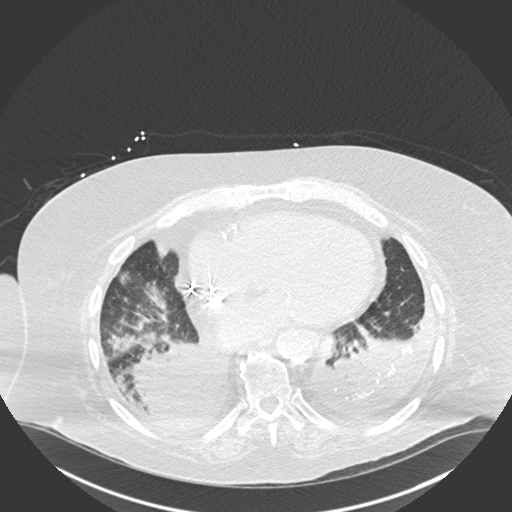
[im 73/164  lung]
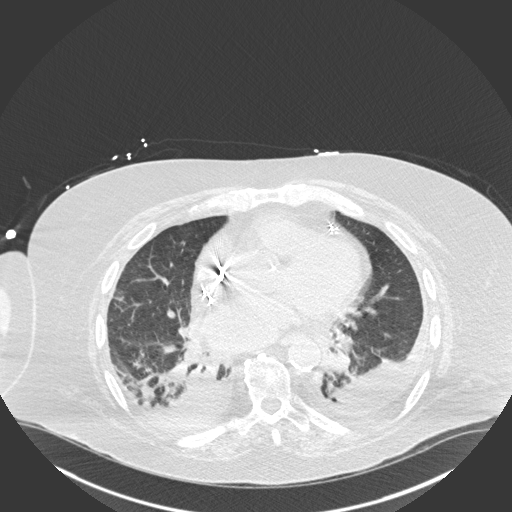
[im 91/164  lung]
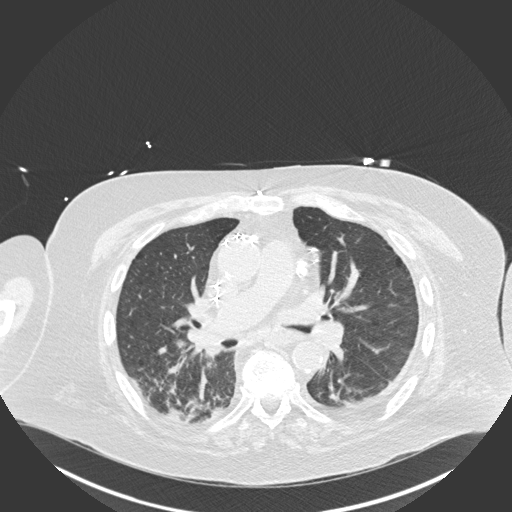
[im 103/164  lung]
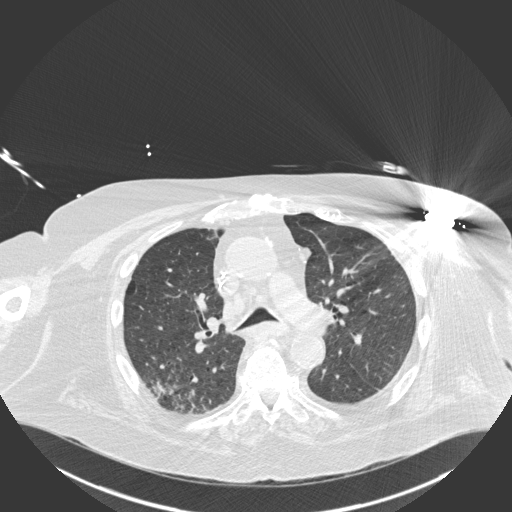
[im 115/164  mediastinal]
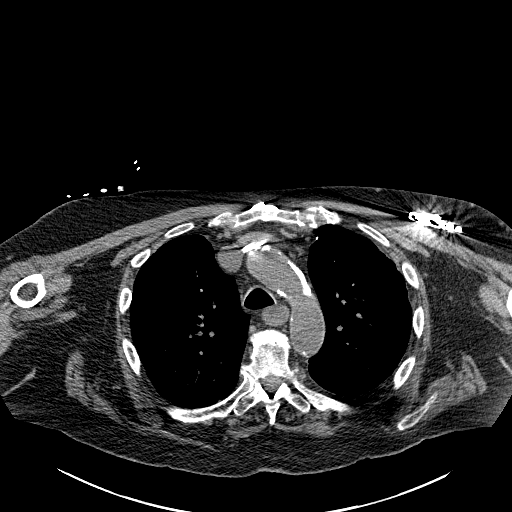
[im 115/164  lung]
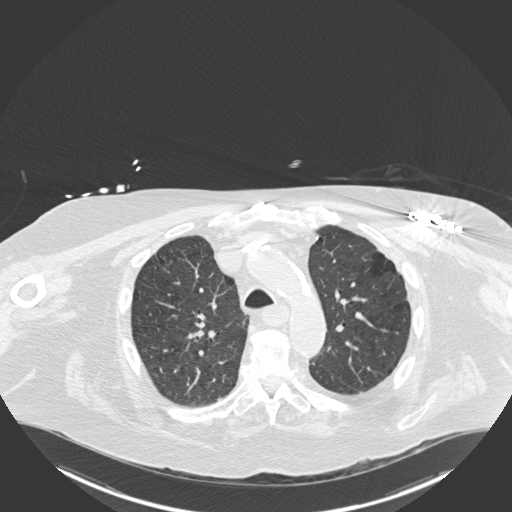
[im 127/164  lung]
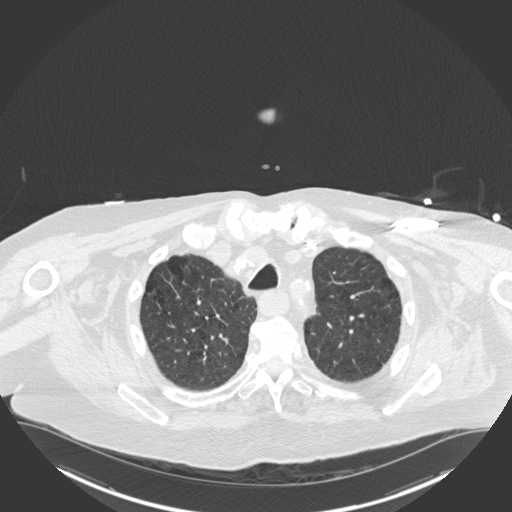
[im 139/164  lung]
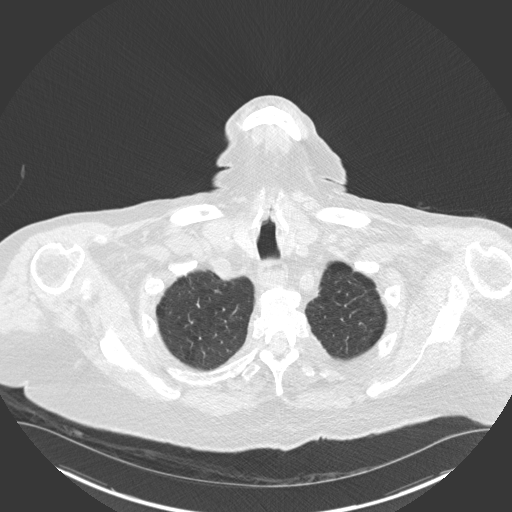
[im 151/164  lung]
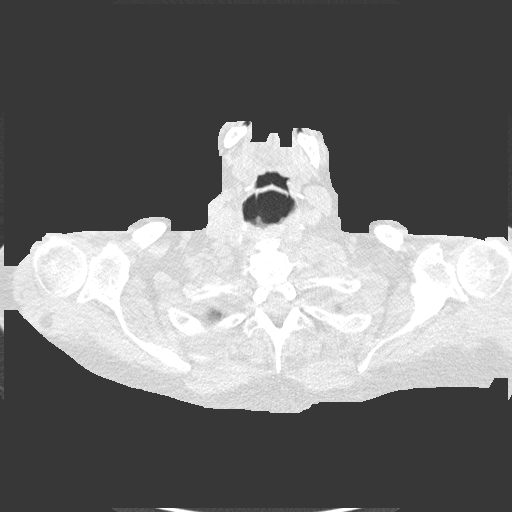

[Series 6: coronal · coronal · 0.66mm/px · 3 of 148 slices shown]
[im 30/148  lung]
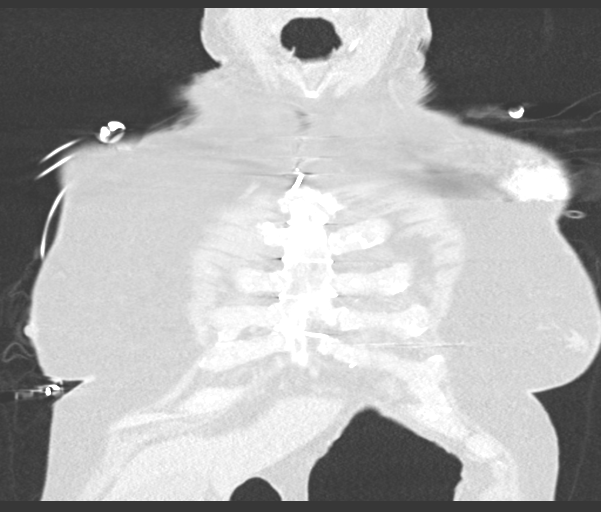
[im 59/148  lung]
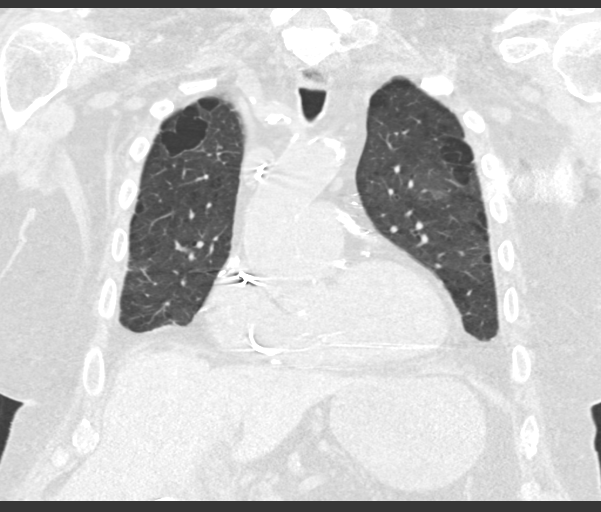
[im 89/148  lung]
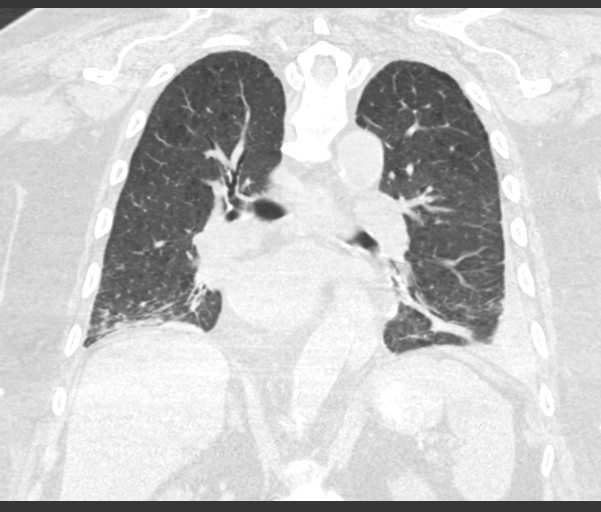

[15 of 36 positions shown; findings below may reference images not displayed]

FINDINGS: Cardiovascular: Prior CABG. Cardiomegaly. Diffusely calcified aorta.
No aneurysm. Left pacer in place with leads in the right atrium and
right ventricle.

Mediastinum/Nodes: Borderline sized scattered mediastinal lymph
nodes, likely reactive. No axillary or visible hilar adenopathy.

Lungs/Pleura: Mild emphysema. Airspace consolidation in both lower
lobes as well as the right middle lobe concerning for pneumonia.
Small bilateral pleural effusions.

Upper Abdomen: Stable low-density lesion in the left hepatic lobe,
likely cyst.

Musculoskeletal: Chest wall soft tissues are unremarkable. Pacer
battery pack in the left chest wall.
IMPRESSION: Consolidation in the lower lobes bilaterally as well as right middle
lobe most compatible with pneumonia. Small bilateral pleural
effusions.

Cardiomegaly.  Prior CABG.

Aortic Atherosclerosis (M48XA-XWZ.Z) and Emphysema (M48XA-OZA.7).
